# Patient Record
Sex: Male | Born: 1960 | Race: Black or African American | Hispanic: No | Marital: Single | Smoking: Never smoker
Health system: Southern US, Community
[De-identification: ages and names within clinical notes are randomized; demographics above are authoritative.]

## PROBLEM LIST (undated history)

## (undated) DIAGNOSIS — D509 Iron deficiency anemia, unspecified: Secondary | ICD-10-CM

## (undated) DIAGNOSIS — C189 Malignant neoplasm of colon, unspecified: Secondary | ICD-10-CM

## (undated) DIAGNOSIS — I82401 Acute embolism and thrombosis of unspecified deep veins of right lower extremity: Secondary | ICD-10-CM

## (undated) HISTORY — PX: LEG SURGERY: SHX1003

---

## 2004-04-27 ENCOUNTER — Emergency Department (HOSPITAL_COMMUNITY): Admission: EM | Admit: 2004-04-27 | Discharge: 2004-04-27 | Payer: Self-pay

## 2004-05-19 ENCOUNTER — Encounter: Admission: RE | Admit: 2004-05-19 | Discharge: 2004-05-19 | Payer: Self-pay | Admitting: Orthopedic Surgery

## 2004-06-18 ENCOUNTER — Encounter: Admission: RE | Admit: 2004-06-18 | Discharge: 2004-07-09 | Payer: Self-pay | Admitting: Orthopedic Surgery

## 2013-06-07 ENCOUNTER — Encounter (HOSPITAL_COMMUNITY): Payer: Self-pay | Admitting: Emergency Medicine

## 2013-06-07 ENCOUNTER — Emergency Department (HOSPITAL_COMMUNITY)
Admission: EM | Admit: 2013-06-07 | Discharge: 2013-06-07 | Disposition: A | Discharge status: Home / Self Care | Payer: Self-pay | Attending: Emergency Medicine | Admitting: Emergency Medicine

## 2013-06-07 ENCOUNTER — Emergency Department (HOSPITAL_COMMUNITY): Payer: Self-pay

## 2013-06-07 DIAGNOSIS — M7989 Other specified soft tissue disorders: Secondary | ICD-10-CM

## 2013-06-07 DIAGNOSIS — M25469 Effusion, unspecified knee: Secondary | ICD-10-CM | POA: Insufficient documentation

## 2013-06-07 DIAGNOSIS — M25461 Effusion, right knee: Secondary | ICD-10-CM

## 2013-06-07 DIAGNOSIS — F17200 Nicotine dependence, unspecified, uncomplicated: Secondary | ICD-10-CM | POA: Insufficient documentation

## 2013-06-07 DIAGNOSIS — Z87828 Personal history of other (healed) physical injury and trauma: Secondary | ICD-10-CM | POA: Insufficient documentation

## 2013-06-07 DIAGNOSIS — Z86718 Personal history of other venous thrombosis and embolism: Secondary | ICD-10-CM

## 2013-06-07 LAB — CBC WITH DIFFERENTIAL/PLATELET
Basophils Absolute: 0.1 10*3/uL (ref 0.0–0.1)
Basophils Relative: 2 % — ABNORMAL HIGH (ref 0–1)
Eosinophils Absolute: 0.1 10*3/uL (ref 0.0–0.7)
Eosinophils Relative: 2 % (ref 0–5)
HCT: 40.1 % (ref 39.0–52.0)
Hemoglobin: 13.5 g/dL (ref 13.0–17.0)
Lymphs Abs: 1.9 10*3/uL (ref 0.7–4.0)
MCHC: 33.7 g/dL (ref 30.0–36.0)
MCV: 87.2 fL (ref 78.0–100.0)
Monocytes Absolute: 0.4 10*3/uL (ref 0.1–1.0)
Monocytes Relative: 10 % (ref 3–12)
WBC: 4.1 10*3/uL (ref 4.0–10.5)

## 2013-06-07 LAB — BASIC METABOLIC PANEL
Calcium: 9.4 mg/dL (ref 8.4–10.5)
Chloride: 105 mEq/L (ref 96–112)
Glucose, Bld: 82 mg/dL (ref 70–99)
Potassium: 4.2 mEq/L (ref 3.5–5.1)
Sodium: 140 mEq/L (ref 135–145)

## 2013-06-07 LAB — SYNOVIAL CELL COUNT + DIFF, W/ CRYSTALS
Eosinophils-Synovial: 0 % (ref 0–1)
Monocyte-Macrophage-Synovial Fluid: 21 % — ABNORMAL LOW (ref 50–90)
WBC, Synovial: 37 /mm3 (ref 0–200)

## 2013-06-07 LAB — C-REACTIVE PROTEIN: CRP: <0.5 mg/dL — ABNORMAL LOW (ref <0.60)

## 2013-06-07 LAB — SEDIMENTATION RATE: Sed Rate: 25 mm/hr — ABNORMAL HIGH (ref 0–16)

## 2013-06-07 LAB — PROTIME-INR
INR: 1.02 (ref 0.00–1.49)
Prothrombin Time: 13.3 seconds (ref 11.6–15.2)

## 2013-06-07 MED ORDER — HYDROCODONE-ACETAMINOPHEN 5-325 MG PO TABS: 2.0000 | ORAL_TABLET | ORAL | Status: DC | PRN

## 2013-06-07 NOTE — ED Provider Notes (Signed)
ARTHOCENTESIS Date/Time: 06/07/2013 11:09 AM Performed by: Dorthula Matas Authorized by: Dorthula Matas Consent: Verbal consent obtained. Risks and benefits: risks, benefits and alternatives were discussed Consent given by: patient Patient understanding: patient states understanding of the procedure being performed Site marked: the operative site was marked Patient identity confirmed: verbally with patient and arm band Indications: joint swelling and pain  Body area: knee Local anesthesia used: yes Anesthesia: local infiltration Local anesthetic: lidocaine 2% without epinephrine Anesthetic total: 5 ml Needle gauge: 22 G Approach: medial Aspirate: blood-tinged and serous Aspirate amount: 25 ml    For HPI, ROS, PE, and Dispo see Dr. Bing Plume note.  Dorthula Matas, PA-C 06/07/13 1111  Dorthula Matas, PA-C 06/07/13 1125  Gilda Crease, MD 06/07/13 [phone]

## 2013-06-07 NOTE — ED Notes (Signed)
Pt here with right leg pain and swelling x 4 weeks

## 2013-06-07 NOTE — ED Notes (Signed)
Patient transported to X-ray 

## 2013-06-07 NOTE — Progress Notes (Signed)
VASCULAR LAB PRELIMINARY  PRELIMINARY  PRELIMINARY  PRELIMINARY  Right lower extremity venous duplex completed.    Preliminary report:  Right:  No evidence of DVT, superficial thrombosis, or Baker's cyst.  Lashanta Elbe, RVT 06/07/2013, 11:52 AM

## 2013-06-07 NOTE — ED Notes (Signed)
Ultrasound at Bedside 
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
## 2013-06-10 LAB — BODY FLUID CULTURE

## 2014-11-29 ENCOUNTER — Encounter (HOSPITAL_COMMUNITY): Payer: Self-pay | Admitting: *Deleted

## 2014-11-29 ENCOUNTER — Other Ambulatory Visit: Payer: Self-pay

## 2014-11-29 ENCOUNTER — Emergency Department (HOSPITAL_COMMUNITY)
Admission: EM | Admit: 2014-11-29 | Discharge: 2014-11-30 | Disposition: A | Discharge status: Home / Self Care | Payer: Self-pay | Attending: Emergency Medicine | Admitting: Emergency Medicine

## 2014-11-29 DIAGNOSIS — D649 Anemia, unspecified: Secondary | ICD-10-CM | POA: Insufficient documentation

## 2014-11-29 DIAGNOSIS — R109 Unspecified abdominal pain: Secondary | ICD-10-CM | POA: Insufficient documentation

## 2014-11-29 DIAGNOSIS — R5383 Other fatigue: Secondary | ICD-10-CM | POA: Insufficient documentation

## 2014-11-29 DIAGNOSIS — K921 Melena: Secondary | ICD-10-CM

## 2014-11-29 DIAGNOSIS — F1010 Alcohol abuse, uncomplicated: Secondary | ICD-10-CM | POA: Insufficient documentation

## 2014-11-29 DIAGNOSIS — R0602 Shortness of breath: Secondary | ICD-10-CM | POA: Insufficient documentation

## 2014-11-29 DIAGNOSIS — R55 Syncope and collapse: Secondary | ICD-10-CM | POA: Insufficient documentation

## 2014-11-29 DIAGNOSIS — Z79899 Other long term (current) drug therapy: Secondary | ICD-10-CM | POA: Insufficient documentation

## 2014-11-29 DIAGNOSIS — R195 Other fecal abnormalities: Secondary | ICD-10-CM | POA: Insufficient documentation

## 2014-11-29 DIAGNOSIS — Z720 Tobacco use: Secondary | ICD-10-CM | POA: Insufficient documentation

## 2014-11-29 LAB — CBC
HCT: 33.6 % — ABNORMAL LOW (ref 39.0–52.0)
Hemoglobin: 10.5 g/dL — ABNORMAL LOW (ref 13.0–17.0)
MCH: 25.9 pg — AB (ref 26.0–34.0)
MCHC: 31.3 g/dL (ref 30.0–36.0)
MCV: 82.8 fL (ref 78.0–100.0)
Platelets: 234 10*3/uL (ref 150–400)
RBC: 4.06 MIL/uL — AB (ref 4.22–5.81)
RDW: 13.8 % (ref 11.5–15.5)
WBC: 5.8 10*3/uL (ref 4.0–10.5)
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
## 2014-11-29 NOTE — ED Notes (Signed)
Pt in stating he has been feeling dizzy when standing up for the last two weeks, symptoms are intermittent, he has had full syncopal episode once, states tonight symptoms returned so he decided to come in, denies pain

## 2014-11-30 ENCOUNTER — Emergency Department (HOSPITAL_COMMUNITY): Payer: Self-pay

## 2014-11-30 ENCOUNTER — Encounter (HOSPITAL_COMMUNITY): Payer: Self-pay

## 2014-11-30 LAB — HEPATIC FUNCTION PANEL
ALT: 30 U/L (ref 0–53)
AST: 53 U/L — ABNORMAL HIGH (ref 0–37)
Albumin: 3.6 g/dL (ref 3.5–5.2)
Alkaline Phosphatase: 78 U/L (ref 39–117)
TOTAL PROTEIN: 8.4 g/dL — AB (ref 6.0–8.3)
Total Bilirubin: <0.2 mg/dL — ABNORMAL LOW (ref 0.3–1.2)

## 2014-11-30 LAB — BASIC METABOLIC PANEL
ANION GAP: 14 (ref 5–15)
BUN: 7 mg/dL (ref 6–23)
CALCIUM: 8.9 mg/dL (ref 8.4–10.5)
CO2: 23 mEq/L (ref 19–32)
Chloride: 103 mEq/L (ref 96–112)
Creatinine, Ser: 0.86 mg/dL (ref 0.50–1.35)
GFR calc Af Amer: >90 mL/min (ref >90)
GFR calc non Af Amer: >90 mL/min (ref >90)
Glucose, Bld: 101 mg/dL — ABNORMAL HIGH (ref 70–99)
POTASSIUM: 4.3 meq/L (ref 3.7–5.3)
SODIUM: 140 meq/L (ref 137–147)

## 2014-11-30 LAB — SAMPLE TO BLOOD BANK

## 2014-11-30 LAB — LIPASE, BLOOD: Lipase: 25 U/L (ref 11–59)

## 2014-11-30 LAB — PROTIME-INR
INR: 1.03 (ref 0.00–1.49)
Prothrombin Time: 13.6 seconds (ref 11.6–15.2)

## 2014-11-30 LAB — POC OCCULT BLOOD, ED: FECAL OCCULT BLD: NEGATIVE

## 2014-11-30 MED ORDER — FERROUS SULFATE 325 (65 FE) MG PO TABS: 325.0000 mg | ORAL_TABLET | Freq: Every day | ORAL | Status: DC

## 2014-11-30 MED ORDER — VITAMIN B-1 100 MG PO TABS: 100.0000 mg | ORAL_TABLET | Freq: Every day | ORAL | Status: DC

## 2014-11-30 MED ORDER — FOLIC ACID 1 MG PO TABS: 1.0000 mg | ORAL_TABLET | Freq: Every day | ORAL | Status: DC

## 2014-11-30 NOTE — ED Notes (Signed)
I stat troponin results not crossing over in Epic. I stat troponin result:  0.00 ng/mL

## 2014-11-30 NOTE — Discharge Instructions (Signed)
Take medications as prescribed.  Use the list below to establish primary care.  Return to the emergency department for worsening condition or new concerning symptoms.   Anemia, Nonspecific Anemia is a condition in which the concentration of red blood cells or hemoglobin in the blood is below normal. Hemoglobin is a substance in red blood cells that carries oxygen to the tissues of the body. Anemia results in not enough oxygen reaching these tissues.  CAUSES  Common causes of anemia include:   Excessive bleeding. Bleeding may be internal or external. This includes excessive bleeding from periods (in women) or from the intestine.   Poor nutrition.   Chronic kidney, thyroid, and liver disease.  Bone marrow disorders that decrease red blood cell production.  Cancer and treatments for cancer.  HIV, AIDS, and their treatments.  Spleen problems that increase red blood cell destruction.  Blood disorders.  Excess destruction of red blood cells due to infection, medicines, and autoimmune disorders. SIGNS AND SYMPTOMS   Minor weakness.   Dizziness.   Headache.  Palpitations.   Shortness of breath, especially with exercise.   Paleness.  Cold sensitivity.  Indigestion.  Nausea.  Difficulty sleeping.  Difficulty concentrating. Symptoms may occur suddenly or they may develop slowly.  DIAGNOSIS  Additional blood tests are often needed. These help your health care provider determine the best treatment. Your health care provider will check your stool for blood and look for other causes of blood loss.  TREATMENT  Treatment varies depending on the cause of the anemia. Treatment can include:   Supplements of iron, vitamin G62, or folic acid.   Hormone medicines.   A blood transfusion. This may be needed if blood loss is severe.   Hospitalization. This may be needed if there is significant continual blood loss.   Dietary changes.  Spleen removal. HOME CARE  INSTRUCTIONS Keep all follow-up appointments. It often takes many weeks to correct anemia, and having your health care provider check on your condition and your response to treatment is very important. SEEK IMMEDIATE MEDICAL CARE IF:   You develop extreme weakness, shortness of breath, or chest pain.   You become dizzy or have trouble concentrating.  You develop heavy vaginal bleeding.   You develop a rash.   You have bloody or black, tarry stools.   You faint.   You vomit up blood.   You vomit repeatedly.   You have abdominal pain.  You have a fever or persistent symptoms for more than 2-3 days.   You have a fever and your symptoms suddenly get worse.   You are dehydrated.  MAKE SURE YOU:  Understand these instructions.  Will watch your condition.  Will get help right away if you are not doing well or get worse. Document Released: 01/21/2005 Document Revised: 08/16/2013 Document Reviewed: 06/09/2013 [facility] Patient Information 2015 [place], [place]. This information is not intended to replace advice given to you by your health care provider. Make sure you discuss any questions you have with your health care provider.  Bloody Stools Bloody stools often mean that there is a problem in the digestive tract. Your caregiver may use the term "melena" to describe black, tarry, and bad smelling stools or "hematochezia" to describe red or maroon-colored stools. Blood seen in the stool can be caused by bleeding anywhere along the intestinal tract.  A black stool usually means that blood is coming from the upper part of the gastrointestinal tract (esophagus, stomach, or small bowel). Passing maroon-colored stools or  bright red blood usually means that blood is coming from lower down in the large bowel or the rectum. However, sometimes massive bleeding in the stomach or small intestine can cause bright red bloody stools.  Consuming black licorice, lead, iron pills, medicines  containing bismuth subsalicylate, or blueberries can also cause black stools. Your caregiver can test black stools to see if blood is present. It is important that the cause of the bleeding be found. Treatment can then be started, and the problem can be corrected. Rectal bleeding may not be serious, but you should not assume everything is okay until you know the cause.It is very important to follow up with your caregiver or a specialist in gastrointestinal problems. CAUSES  Blood in the stools can come from various underlying causes.Often, the cause is not found during your first visit. Testing is often needed to discover the cause of bleeding in the gastrointestinal tract. Causes range from simple to serious or even life-threatening.Possible causes include:  Hemorrhoids.These are veins that are full of blood (engorged) in the rectum. They cause pain, inflammation, and may bleed.  Anal fissures.These are areas of painful tearing which may bleed. They are often caused by passing hard stool.  Diverticulosis.These are pouches that form on the colon over time, with age, and may bleed significantly.  Diverticulitis.This is inflammation in areas with diverticulosis. It can cause pain, fever, and bloody stools, although bleeding is rare.  Proctitis and colitis. These are inflamed areas of the rectum or colon. They may cause pain, fever, and bloody stools.  Polyps and cancer. Colon cancer is a leading cause of preventable cancer death.It often starts out as precancerous polyps that can be removed during a colonoscopy, preventing progression into cancer. Sometimes, polyps and cancer may cause rectal bleeding.  Gastritis and ulcers.Bleeding from the upper gastrointestinal tract (near the stomach) may travel through the intestines and produce black, sometimes tarry, often bad smelling stools. In certain cases, if the bleeding is fast enough, the stools may not be black, but red and the condition may  be life-threatening. SYMPTOMS  You may have stools that are bright red and bloody, that are normal color with blood on them, or that are dark black and tarry. In some cases, you may only have blood in the toilet bowl. Any of these cases need medical care. You may also have:  Pain at the anus or anywhere in the rectum.  Lightheadedness or feeling faint.  Extreme weakness.  Nausea or vomiting.  Fever. DIAGNOSIS Your caregiver may use the following methods to find the cause of your bleeding:  Taking a medical history. Age is important. Older people tend to develop polyps and cancer more often. If there is anal pain and a hard, large stool associated with bleeding, a tear of the anus may be the cause. If blood drips into the toilet after a bowel movement, bleeding hemorrhoids may be the problem. The color and frequency of the bleeding are additional considerations. In most cases, the medical history provides clues, but seldom the final answer.  A visual and finger (digital) exam. Your caregiver will inspect the anal area, looking for tears and hemorrhoids. A finger exam can provide information when there is tenderness or a growth inside. In men, the prostate is also examined.  Endoscopy. Several types of small, long scopes (endoscopes) are used to view the colon.  In the office, your caregiver may use a rigid, or more commonly, a flexible viewing sigmoidoscope. This exam is called flexible sigmoidoscopy.  It is performed in 5 to 10 minutes.  A more thorough exam is accomplished with a colonoscope. It allows your caregiver to view the entire 5 to 6 foot long colon. Medicine to help you relax (sedative) is usually given for this exam. Frequently, a bleeding lesion may be present beyond the reach of the sigmoidoscope. So, a colonoscopy may be the best exam to start with. Both exams are usually done on an outpatient basis. This means the patient does not stay overnight in the hospital or surgery  center.  An upper endoscopy may be needed to examine your stomach. Sedation is used and a flexible endoscope is put in your mouth, down to your stomach.  A barium enema X-ray. This is an X-ray exam. It uses liquid barium inserted by enema into the rectum. This test alone may not identify an actual bleeding point. X-rays highlight abnormal shadows, such as those made by lumps (tumors), diverticuli, or colitis. TREATMENT  Treatment depends on the cause of your bleeding.   For bleeding from the stomach or colon, the caregiver doing your endoscopy or colonoscopy may be able to stop the bleeding as part of the procedure.  Inflammation or infection of the colon can be treated with medicines.  Many rectal problems can be treated with creams, suppositories, or warm baths.  Surgery is sometimes needed.  Blood transfusions are sometimes needed if you have lost a lot of blood.  For any bleeding problem, let your caregiver know if you take aspirin or other blood thinners regularly. HOME CARE INSTRUCTIONS   Take any medicines exactly as prescribed.  Keep your stools soft by eating a diet high in fiber. Prunes (1 to 3 a day) work well for many people.  Drink enough water and fluids to keep your urine clear or pale yellow.  Take sitz baths if advised. A sitz bath is when you sit in a bathtub with warm water for 10 to 15 minutes to soak, soothe, and cleanse the rectal area.  If enemas or suppositories are advised, be sure you know how to use them. Tell your caregiver if you have problems with this.  Monitor your bowel movements to look for signs of improvement or worsening. SEEK MEDICAL CARE IF:   You do not improve in the time expected.  Your condition worsens after initial improvement.  You develop any new symptoms. SEEK IMMEDIATE MEDICAL CARE IF:   You develop severe or prolonged rectal bleeding.  You vomit blood.  You feel weak or faint.  You have a fever. MAKE SURE  YOU:  Understand these instructions.  Will watch your condition.  Will get help right away if you are not doing well or get worse. Document Released: 12/04/2002 Document Revised: 03/07/2012 Document Reviewed: 05/01/2011 [facility] Patient Information 2015 [place], [place]. This information is not intended to replace advice given to you by your health care provider. Make sure you discuss any questions you have with your health care provider.  Syncope Syncope means a person passes out (faints). The person usually wakes up in less than 5 minutes. It is important to seek medical care for syncope. HOME CARE  Have someone stay with you until you feel normal.  Do not drive, use machines, or play sports until your doctor says it is okay.  Keep all doctor visits as told.  Lie down when you feel like you might pass out. Take deep breaths. Wait until you feel normal before standing up.  Drink enough fluids to keep your  pee (urine) clear or pale yellow.  If you take blood pressure or heart medicine, get up slowly. Take several minutes to sit and then stand. GET HELP RIGHT AWAY IF:   You have a severe headache.  You have pain in the chest, belly (abdomen), or back.  You are bleeding from the mouth or butt (rectum).  You have black or tarry poop (stool).  You have an irregular or very fast heartbeat.  You have pain with breathing.  You keep passing out, or you have shaking (seizures) when you pass out.  You pass out when sitting or lying down.  You feel confused.  You have trouble walking.  You have severe weakness.  You have vision problems. If you fainted, call your local emergency services (911 in [place] Do not drive yourself to the hospital. MAKE SURE YOU:   Understand these instructions.  Will watch your condition.  Will get help right away if you are not doing well or get worse. Document Released: 06/01/2008 Document Revised: 06/14/2012 Document Reviewed:  02/12/2012 [facility] Patient Information 2015 [place], [place]. This information is not intended to replace advice given to you by your health care provider. Make sure you discuss any questions you have with your health care provider.   Alcohol and Nutrition Nutrition serves two purposes. It provides energy. It also maintains body structure and function. Food supplies energy. It also provides the building blocks needed to replace worn or damaged cells. Alcoholics often eat poorly. This limits their supply of essential nutrients. This affects energy supply and structure maintenance. Alcohol also affects the body's nutrients in:  Digestion.  Storage.  Using and getting rid of waste products. IMPAIRMENT OF NUTRIENT DIGESTION AND UTILIZATION   Once ingested, food must be broken down into small components (digested). Then it is available for energy. It helps maintain body structure and function. Digestion begins in the mouth. It continues in the stomach and intestines, with help from the pancreas. The nutrients from digested food are absorbed from the intestines into the blood. Then they are carried to the liver. The liver prepares nutrients for:  Immediate use.  Storage and future use.  Alcohol inhibits the breakdown of nutrients into usable molecules.  It decreases secretion of digestive enzymes from the pancreas.  Alcohol impairs nutrient absorption by damaging the cells lining the stomach and intestines.  It also interferes with moving some nutrients into the blood.  In addition, nutritional deficiencies themselves may lead to further absorption problems.  For example, folate deficiency changes the cells that line the small intestine. This impairs how water is absorbed. It also affects absorbed nutrients. These include glucose, sodium, and additional folate.  Even if nutrients are digested and absorbed, alcohol can prevent them from being fully used. It changes their transport, storage,  and excretion. Impaired utilization of nutrients by alcoholics is indicated by:  Decreased liver stores of vitamins, such as vitamin A.  Increased excretion of nutrients such as fat. ALCOHOL AND ENERGY SUPPLY   Three basic nutritional components found in food are:  Carbohydrates.  Proteins.  Fats.  These are used as energy. Some alcoholics take in as much as 50% of their total daily calories from alcohol. They often neglect important foods.  Even when enough food is eaten, alcohol can impair the ways the body controls blood sugar (glucose) levels. It may either increase or decrease blood sugar.  In non-diabetic alcoholics, increased blood sugar (hyperglycemia) is caused by poor insulin secretion. It is usually temporary.  Decreased blood sugar (hypoglycemia) can cause serious injury even if this condition is short-lived. Low blood sugar can happen when a fasting or malnourished person drinks alcohol. When there is no food to supply energy, stored sugar is used up. The products of alcohol inhibit forming glucose from other compounds such as amino acids. As a result, alcohol causes the brain and other body tissue to lack glucose. It is needed for energy and function.  Alcohol is an energy source. But how the body processes and uses the energy from alcohol is complex. Also, when alcohol is substituted for carbohydrates, subjects tend to lose weight. This indicates that they get less energy from alcohol than from food. ALCOHOL - MAINTAINING CELL STRUCTURE AND FUNCTION  Structure Cells are made mostly of protein. So an adequate protein diet is important for maintaining cell structure. This is especially true if cells are being damaged. Research indicates that alcohol affects protein nutrition by causing impaired:  Digestion of proteins to amino acids.  Processing of amino acids by the small intestine and liver.  Synthesis of proteins from amino acids.  Protein secretion by the  liver. Function Nutrients are essential for the body to function well. They provide the tools that the body needs to work well:   Proteins.  Vitamins.  Minerals. Alcohol can disrupt body function. It may cause nutrient deficiencies. And it may interfere with the way nutrients are processed. Vitamins  Vitamins are essential to maintain growth and normal metabolism. They regulate many of the body`s processes. Chronic heavy drinking causes deficiencies in many vitamins. This is caused by eating less. And, in some cases, vitamins may be poorly absorbed. For example, alcohol inhibits fat absorption. It impairs how the vitamins A, E, and D are normally absorbed along with dietary fats. Not enough vitamin A may cause night blindness. Not enough vitamin D may cause softening of the bones.  Some alcoholics lack vitamins A, C, D, E, K, and the B vitamins. These are all involved in wound healing and cell maintenance. In particular, because vitamin K is necessary for blood clotting, lacking that vitamin can cause delayed clotting. The result is excess bleeding. Lacking other vitamins involved in brain function may cause severe neurological damage. Minerals Deficiencies of minerals such as calcium, magnesium, iron, and zinc are common in alcoholics. The alcohol itself does not seem to affect how these minerals are absorbed. Rather, they seem to occur secondary to other alcohol-related problems, such as:  Less calcium absorbed.  Not enough magnesium.  More urinary excretion.  Vomiting.  Diarrhea.  Not enough iron due to gastrointestinal bleeding.  Not enough zinc or losses related to other nutrient deficiencies.  Mineral deficiencies can cause a variety of medical consequences. These range from calcium-related bone disease to zinc-related night blindness and skin lesions. ALCOHOL, MALNUTRITION, AND MEDICAL COMPLICATIONS  Liver Disease   Alcoholic liver damage is caused primarily by alcohol  itself. But poor nutrition may increase the risk of alcohol-related liver damage. For example, nutrients normally found in the liver are known to be affected by drinking alcohol. These include carotenoids, which are the major sources of vitamin A, and vitamin E compounds. Decreases in such nutrients may play some role in alcohol-related liver damage. Pancreatitis  Research suggests that malnutrition may increase the risk of developing alcoholic pancreatitis. Research suggests that a diet lacking in protein may increase alcohol's damaging effect on the pancreas. Brain  Nutritional deficiencies may have severe effects on brain function. These may be permanent. Specifically,  thiamine deficiencies are often seen in alcoholics. They can cause severe neurological problems. These include:  Impaired movement.  Memory loss seen in Wernicke-Korsakoff syndrome. Pregnancy  Alcohol has toxic effects on fetal development. It causes alcohol-related birth defects. They include fetal alcohol syndrome. Alcohol itself is toxic to the fetus. Also, the nutritional deficiency can affect how the fetus develops. That may compound the risk of developmental damage.  Nutritional needs during pregnancy are 10% to 30% greater than normal. Food intake can increase by as much as 140% to cover the needs of both mother and fetus. An alcoholic mother`s nutritional problems may adversely affect the nutrition of the fetus. And alcohol itself can also restrict nutrition flow to the fetus. NUTRITIONAL STATUS OF ALCOHOLICS  Techniques for assessing nutritional status include:  Taking body measurements to estimate fat reserves. They include:  Weight.  Height.  Mass.  Skin fold thickness.  Performing blood analysis to provide measurements of circulating:  Proteins.  Vitamins.  Minerals.  These techniques tend to be imprecise. For many nutrients, there is no clear "cut-off" point that would allow an accurate definition  of deficiency. So assessing the nutritional status of alcoholics is limited by these techniques. Dietary status may provide information about the risk of developing nutritional problems. Dietary status is assessed by:  Taking patients' dietary histories.  Evaluating the amount and types of food they are eating.  It is difficult to determine what exact amount of alcohol begins to have damaging effects on nutrition. In general, moderate drinkers have 2 drinks or less per day. They seem to be at little risk for nutritional problems. Various medical disorders begin to appear at greater levels.  Research indicates that the majority of even the heaviest drinkers have few obvious nutritional deficiencies. Many alcoholics who are hospitalized for medical complications of their disease do have severe malnutrition. Alcoholics tend to eat poorly. Often they eat less than the amounts of food necessary to provide enough:  Carbohydrates.  Protein.  Fat.  Vitamins A and C.  B vitamins.  Minerals like calcium and iron. Of major concern is alcohol's effect on digesting food and use of nutrients. It may shift a mildly malnourished person toward severe malnutrition. Document Released: 10/08/2005 Document Revised: 03/07/2012 Document Reviewed: 03/24/2006 [facility] Patient Information 2015 [place], [place]. This information is not intended to replace advice given to you by your health care provider. Make sure you discuss any questions you have with your health care provider.    Emergency Department Resource Guide 1) Find a Doctor and Pay Out of Pocket Although you won't have to find out who is covered by your insurance plan, it is a good idea to ask around and get recommendations. You will then need to call the office and see if the doctor you have chosen will accept you as a new patient and what types of options they offer for patients who are self-pay. Some doctors offer discounts or will set up payment  plans for their patients who do not have insurance, but you will need to ask so you aren't surprised when you get to your appointment.  2) Contact Your Local Health Department Not all health departments have doctors that can see patients for sick visits, but many do, so it is worth a call to see if yours does. If you don't know where your local health department is, you can check in your phone book. The CDC also has a tool to help you locate your state's health department, and  many state websites also have listings of all of their local health departments.  3) Find a [facility] If your illness is not likely to be very severe or complicated, you may want to try a walk in clinic. These are popping up all over the country in pharmacies, drugstores, and shopping centers. They're usually staffed by nurse practitioners or physician assistants that have been trained to treat common illnesses and complaints. They're usually fairly quick and inexpensive. However, if you have serious medical issues or chronic medical problems, these are probably not your best option.  No Primary Care Doctor: - Call [facility] at  [phone] - they can help you locate a primary care doctor that  accepts your insurance, provides certain services, etc. - Physician Referral Service- [phone]  Chronic Pain Problems: Organization         Address  Phone   Notes  [facility]  [phone] Patients need to be referred by their primary care doctor.   Medication Assistance: Organization         Address  Phone   Notes  [facility] [address] [place], [place] [address] [phone] --Must be a resident of [facility] -- Must have NO insurance coverage whatsoever (no Medicaid/ Medicare, etc.) -- The pt. MUST have a primary care doctor that directs their care regularly and follows them in the community   [facility]  [phone]   [employer]   [phone]    Agencies that provide inexpensive medical care: Organization         Address  Phone   Notes  [place]  [phone]   [facility]    [phone]   [facility] [place], [place] [address] [phone]   [place] [address], [place] [phone]   [facility]    [phone]   [facility]    [phone]   [place] [address], [place] Phone:  [phone], Fax:  [phone] Hours of Operation:  9 am - 6 pm, M-F.  Also accepts Medicaid/Medicare and self-pay.  [facility] [place] [address], [address], [place] Phone: [phone], Fax: [phone]. Hours of Operation:  8:30 am - 5:30 pm, M-F.  Also accepts Medicaid and self-pay.  [facility] [address], [place] Phone: [phone]   [place], [place], [place] [phone], Ext. [phone] Mondays & Thursdays: 7-9 AM.  First 15 patients are seen on a first come, first serve basis.    [place] Providers:  Organization         Address  Phone   Notes  [facility] [address], [place] [phone] Also accepts self-pay patients.  [facility] [address], [place]  [phone]   [place], [address], [place] [phone]   [facility] [address], [place] [phone]   [name] [address], [place]   [phone] Only accepts [place] Access [place] patients after they have their name applied to their card.   Self-Pay (no insurance) in [facility]:  [employer]  Phone   Notes  Sickle Cell Patients, [facility] [place] [phone]   [facility] [place] [phone]   [facility] [place]  [address], [address], [place] [phone]   [facility]/Dr. Osei-Bonsu  [address] [place] or [address], [place] [phone] Phone number for both [place] and [place] locations is the same.  [facility] [address] [place] [phone]   [facility] [address], [place] or [address] [phone]   [facility] [address], [place] [phone], phone; [phone], fax Sees patients 1st and 3rd Saturday of every month.  Must not qualify for public or private insurance (i.e. Medicaid, Medicare, [place] Health Choice, Veterans' Benefits)  Household income should be no more than 200% of the poverty level The clinic cannot treat you if you are pregnant or think you are pregnant  Sexually transmitted diseases are not treated at the clinic.    Dental Care: Organization         Address  Phone  Notes  [facility] Department of [facility] [phone] Accepts children up to age 85 who are enrolled in [place] or [place]; pregnant women with a Medicaid card; and children who have applied for Medicaid or [place] Health Choice, but were declined, whose parents can pay a reduced fee at time of service.  [facility] Department of [facility]  [address], [place] [phone] Accepts children up to age 39 who are enrolled in [place] or [place]; pregnant women with a Medicaid card; and children who have applied for Medicaid or  Health Choice, but were declined, whose parents can pay a reduced fee at time of service.  [place] Adult [facility] Access PROGRAM  [place] [phone] Patients are  seen by appointment only. Walk-ins are not accepted. [facility] will see patients 80 years of age and older. Monday - Tuesday (8am-5pm) Most Wednesdays (8:30-5pm) $30 per visit, cash only  [facility] Adult Dental Access PROGRAM  [address], [facility] [phone] Patients are seen by appointment only. Walk-ins are not accepted. [place] will see patients 53 years of age and older. One Wednesday Evening (Monthly: Volunteer Based).  $30 per visit, cash only  [place]  [phone] for adults; Children under age 55, call Graduate Pediatric Dentistry at [phone]. Children aged 37-14, please call [phone] to request a pediatric application.  Dental services are provided in all areas of dental care including fillings, crowns and bridges, complete and partial dentures, implants, gum treatment, root canals, and extractions. Preventive care is also provided. Treatment is provided to both adults and children. Patients are selected via a lottery and there is often a waiting list.   [facility] [address] [place]  [phone] [email or web address]   [facility] [address] [place], [place] [phone], Ext. [phone] Second and Fourth Thursday of each month, opens at 6:30 AM; Clinic ends at 9 AM.  Patients are seen on a first-come first-served basis, and a limited number are seen during each clinic.   [facility]  [address], [place], [place] (  [phone]   Eligibility Requirements You must have lived in [place], [place], or [place] counties for at least the last three months.   You cannot be eligible for state or federal sponsored [employer], including [employer], [place], or [employer].   You generally cannot be eligible for healthcare insurance through your employer.    How to apply: Eligibility screenings are held every Tuesday and Wednesday afternoon from 1:00 pm until 4:00  pm. You do not need an appointment for the interview!  [facility] [address], [place], [place]   [place]  [place] Department  [place]  [phone]    Behavioral Health Resources in the Community: Intensive Outpatient Programs Organization         Address  Phone  Notes  [place]. [address] [place], [place] [phone]   [facility] Outpatient [address], [place], [place]   ADS: Alcohol & Drug Sv[facility] [address] [place], [place]   [place] [address],  [place], [place] or [phone]   Substance Abuse Resources Organization         Address  Phone  Notes  Alcohol and Drug Services  [phone]   [place]  [phone]   [facility] [place]   Chinita Pester  [phone]   Residential & Outpatient Substance Abuse Program  [phone]   Psychological Services Organization         Address  Phone  Notes  [facility] [place]  [place]  [phone]   [place] [address] [place] or [phone]    Mobile Crisis Teams Organization         Address  Phone  Notes  [facility], Mobile Crisis Care Unit  [phone]   [facility]  [address] [place], [place]   [place] [address], [place] [phone]    Self-Help/Support Groups Organization         Address  Phone             Notes  [place] [place] - variety of support groups  [employer] Call for more information  Narcotics Anonymous (NA), [facility] [address], [employer] [place]  2 meetings at this location   Special educational needs teacher          Address  Phone  Notes  [facility] [place],    [place]  [phone]   [facility]  [address] [place] [phone], [place], [place]   [place], [place] [phone] Admissions: 8am-3pm M-F  Incentives Substance [place] [address]    [place], [place] [phone]   [facility] [address] Jadene Pierini [place], [place]   [facility] [address]  [place], [place]   [facility] - Intensive Outpatient [address] Kristeen [address], [place], [place]   [facility] ([place].,  [place], [place] or [phone]   [facility] ([facility]) [address] [place], [place] Accepts Medicaid  [facility] [place] [address],  [place] [phone] Substance Abuse/Addiction Treatment   [facility] Resources Organization  Address  Phone  Notes  [facility]  [phone]   Domenic Schwab, PhD [address] Arlis Porta [place], [place]   [phone] or [phone]   [place], [place] [phone]   [address], [place], [place] [phone] Insurance/Medicaid/sponsorship through [facility] and Families [address]                                    [place], [place] [phone] [place] [address], [place] [phone]    Dr. Adele Schilder  [phone]   [facility] [place] Dept. 1) [address] [place] 2) [place] 3)  [place] [address], [place] [phone]  [phone]   [place] [phone] or [phone] (After Hours)

## 2014-12-03 LAB — I-STAT TROPONIN, ED: Troponin i, poc: 0 ng/mL (ref 0.00–0.08)

## 2016-07-24 ENCOUNTER — Encounter (HOSPITAL_COMMUNITY): Payer: Self-pay | Admitting: *Deleted

## 2016-07-24 ENCOUNTER — Emergency Department (HOSPITAL_COMMUNITY)
Admission: EM | Admit: 2016-07-24 | Discharge: 2016-07-24 | Disposition: A | Discharge status: Home / Self Care | Payer: Self-pay | Attending: Emergency Medicine | Admitting: Emergency Medicine

## 2016-07-24 DIAGNOSIS — R1010 Upper abdominal pain, unspecified: Secondary | ICD-10-CM | POA: Insufficient documentation

## 2016-07-24 DIAGNOSIS — F17200 Nicotine dependence, unspecified, uncomplicated: Secondary | ICD-10-CM | POA: Insufficient documentation

## 2016-07-24 LAB — COMPREHENSIVE METABOLIC PANEL
ALBUMIN: 2.9 g/dL — AB (ref 3.5–5.0)
ALT: 8 U/L — ABNORMAL LOW (ref 17–63)
AST: 21 U/L (ref 15–41)
Alkaline Phosphatase: 72 U/L (ref 38–126)
Anion gap: 6 (ref 5–15)
BILIRUBIN TOTAL: 0.7 mg/dL (ref 0.3–1.2)
CO2: 24 mmol/L (ref 22–32)
CREATININE: 0.6 mg/dL — AB (ref 0.61–1.24)
Calcium: 9.1 mg/dL (ref 8.9–10.3)
Chloride: 103 mmol/L (ref 101–111)
GFR calc Af Amer: >60 mL/min (ref >60)
GFR calc non Af Amer: >60 mL/min (ref >60)
GLUCOSE: 108 mg/dL — AB (ref 65–99)
POTASSIUM: 3.9 mmol/L (ref 3.5–5.1)
Sodium: 133 mmol/L — ABNORMAL LOW (ref 135–145)
TOTAL PROTEIN: 7.5 g/dL (ref 6.5–8.1)

## 2016-07-24 LAB — URINALYSIS, ROUTINE W REFLEX MICROSCOPIC
GLUCOSE, UA: NEGATIVE mg/dL
Hgb urine dipstick: NEGATIVE
KETONES UR: 15 mg/dL — AB
Leukocytes, UA: NEGATIVE
Nitrite: NEGATIVE
Protein, ur: NEGATIVE mg/dL
Specific Gravity, Urine: 1.02 (ref 1.005–1.030)
pH: 5 (ref 5.0–8.0)

## 2016-07-24 LAB — CBC
HCT: 35.3 % — ABNORMAL LOW (ref 39.0–52.0)
Hemoglobin: 11.4 g/dL — ABNORMAL LOW (ref 13.0–17.0)
MCH: 27 pg (ref 26.0–34.0)
MCHC: 32.3 g/dL (ref 30.0–36.0)
MCV: 83.5 fL (ref 78.0–100.0)
PLATELETS: 267 10*3/uL (ref 150–400)
RBC: 4.23 MIL/uL (ref 4.22–5.81)
RDW: 14 % (ref 11.5–15.5)
WBC: 9.1 10*3/uL (ref 4.0–10.5)

## 2016-07-24 LAB — LIPASE, BLOOD: Lipase: 16 U/L (ref 11–51)

## 2016-07-24 MED ORDER — FENTANYL CITRATE (PF) 100 MCG/2ML IJ SOLN
50.0000 ug | Freq: Once | INTRAMUSCULAR | Status: AC
  Administered 2016-07-24: 50 ug via INTRAVENOUS
  Filled 2016-07-24: qty 2

## 2016-07-24 MED ORDER — SODIUM CHLORIDE 0.9 % IV BOLUS (SEPSIS)
1000.0000 mL | Freq: Once | INTRAVENOUS | Status: AC
  Administered 2016-07-24: 1000 mL via INTRAVENOUS

## 2016-07-24 MED ORDER — FAMOTIDINE IN NACL 20-0.9 MG/50ML-% IV SOLN
20.0000 mg | Freq: Once | INTRAVENOUS | Status: AC
  Administered 2016-07-24: 20 mg via INTRAVENOUS
  Filled 2016-07-24: qty 50

## 2016-07-24 MED ORDER — ONDANSETRON HCL 4 MG/2ML IJ SOLN
4.0000 mg | Freq: Once | INTRAMUSCULAR | Status: AC
  Administered 2016-07-24: 4 mg via INTRAVENOUS
  Filled 2016-07-24: qty 2

## 2016-07-24 MED ORDER — FAMOTIDINE 20 MG PO TABS: 20.0000 mg | ORAL_TABLET | Freq: Two times a day (BID) | ORAL | 0 refills | Status: DC

## 2016-07-24 MED ORDER — SUCRALFATE 1 G PO TABS: 1.0000 g | ORAL_TABLET | Freq: Three times a day (TID) | ORAL | 0 refills | Status: DC

## 2016-07-24 NOTE — ED Notes (Signed)
Pt has urinal at bedside will attempt to obtain urine when he is able to go.

## 2016-07-24 NOTE — ED Triage Notes (Signed)
Pt reports left side back pain that radiates around to left lower abd x 2-3 weeks. Denies urinary symptoms.

## 2016-07-24 NOTE — Discharge Instructions (Signed)
As we discussed, I encourage you to cut back on your alcohol consumption. In the meantime take medications as prescribed. Follow up with GI. Return to the ER for new or worsening symptoms.

## 2016-07-24 NOTE — ED Provider Notes (Signed)
[place] [facility] DEPT Provider Note   CSN: [id number] Arrival date & time: 07/24/16  [id number]  First Provider Contact:  First MD Initiated Contact with Patient 07/24/16 1120        History   Chief Complaint Chief Complaint  Patient presents with  . Abdominal Pain  . Back Pain   HPI   Jeff [name] is an 55 y.o. male with history of alcohol abuse who presents to the ED for evaluation of abdominal pain. He reports lower left abdominal pain that radiates to his left flank for the past 2-3 weeks. He states the pain is constant though waxes and wanes in severity. He states the pain is worse with movement and with PO intake. He denies n/v/d. Denies urinary symptoms. States nothing like this has ever happened before. He has not tried anything to alleviate his pain.  History reviewed. No pertinent past medical history.  There are no active problems to display for this patient.   History reviewed. No pertinent surgical history.     Home Medications    Prior to Admission medications   Medication Sig Start Date End Date Taking? Authorizing Provider  ferrous sulfate 325 (65 FE) MG tablet Take 1 tablet (325 mg total) by mouth daily. 11/30/14  Yes Linton Flemings, MD  folic acid (FOLVITE) 1 MG tablet Take 1 tablet (1 mg total) by mouth daily. 11/30/14  Yes Linton Flemings, MD  thiamine (VITAMIN B-1) 100 MG tablet Take 1 tablet (100 mg total) by mouth daily. 11/30/14  Yes Linton Flemings, MD  HYDROcodone-acetaminophen (NORCO/VICODIN) 5-325 MG per tablet Take 2 tablets by mouth every 4 (four) hours as needed for pain. Patient not taking: Reported on 07/24/2016 06/07/13   Orpah Greek, MD    Family History History reviewed. No pertinent family history.  Social History Social History  Substance Use Topics  . Smoking status: Current Every Day Smoker  . Smokeless tobacco: Not on file  . Alcohol use Yes     Allergies   Review of patient's allergies indicates no known allergies.   Review of  Systems Review of Systems 10 Systems reviewed and are negative for acute change except as noted in the HPI.   Physical Exam Updated Vital Signs BP 128/88   Pulse 77   Temp 98.3 F (36.8 C) (Oral)   Resp 16   SpO2 100%   Physical Exam  Constitutional: He is oriented to person, place, and time.  HENT:  Right Ear: External ear normal.  Left Ear: External ear normal.  Nose: Nose normal.  Mouth/Throat: Oropharynx is clear and moist. No oropharyngeal exudate.  Eyes: Conjunctivae and EOM are normal. Pupils are equal, round, and reactive to light.  Neck: Normal range of motion. Neck supple.  Cardiovascular: Normal rate, regular rhythm, normal heart sounds and intact distal pulses.   Pulmonary/Chest: Effort normal and breath sounds normal. No respiratory distress. He has no wheezes. He exhibits no tenderness.  Abdominal: Soft. Bowel sounds are normal. He exhibits no distension. There is no rebound and no guarding.  Mild diffuse abdominal tenderness, most at epigastrum, without guarding. Abdomen soft nondistended.  Musculoskeletal: He exhibits no edema.  Neurological: He is alert and oriented to person, place, and time. No cranial nerve deficit.  Skin: Skin is warm and dry.  Psychiatric: He has a normal mood and affect.  Nursing note and vitals reviewed.    ED Treatments / Results  Labs (all labs ordered are listed, but only abnormal results are displayed) Labs Reviewed  COMPREHENSIVE METABOLIC PANEL - Abnormal; Notable for the following:       Result Value   Sodium 133 (*)    Glucose, Bld 108 (*)    BUN <5 (*)    Creatinine, Ser 0.60 (*)    Albumin 2.9 (*)    ALT 8 (*)    All other components within normal limits  CBC - Abnormal; Notable for the following:    Hemoglobin 11.4 (*)    HCT 35.3 (*)    All other components within normal limits  URINALYSIS, ROUTINE W REFLEX MICROSCOPIC (NOT AT [facility]) - Abnormal; Notable for the following:    Color, Urine AMBER (*)    Bilirubin  Urine SMALL (*)    Ketones, ur 15 (*)    All other components within normal limits  LIPASE, BLOOD    EKG  EKG Interpretation None       Radiology No results found.  Procedures Procedures (including critical care time)  Medications Ordered in ED Medications  sodium chloride 0.9 % bolus 1,000 mL (0 mLs Intravenous Stopped 07/24/16 1309)  fentaNYL (SUBLIMAZE) injection 50 mcg (50 mcg Intravenous Given 07/24/16 1200)  ondansetron (ZOFRAN) injection 4 mg (4 mg Intravenous Given 07/24/16 1159)  famotidine (PEPCID) IVPB 20 mg premix (0 mg Intravenous Stopped 07/24/16 1340)     Initial Impression / Assessment and Plan / ED Course  I have reviewed the triage vital signs and the nursing notes.  Pertinent labs & imaging results that were available during my care of the patient were reviewed by me and considered in my medical decision making (see chart for details).  Labs unrevealing. On repeat exam after fluids and meds abdominal exam completely benign. Pain has resolved. Doubt emergent intra-abdominal process requiring further work up or imaging. Pt does drink EtOH daily which I suspect might be contributing to his symptoms. Poss GERD vs PUD vs alcoholic gastritis. I discussed alcohol cessation (or at least reduction) and he feels he can do so. Declined outpatient resources for substance use. Will give rx for pepcid and carafate. Encouraged PCP and GI follow up. ER return precautions given.  Final Clinical Impressions(s) / ED Diagnoses   Final diagnoses:  Pain of upper abdomen    New Prescriptions Discharge Medication List as of 07/24/2016  1:25 PM    START taking these medications   Details  famotidine (PEPCID) 20 MG tablet Take 1 tablet (20 mg total) by mouth 2 (two) times daily., Starting Fri 07/24/2016, Print    sucralfate (CARAFATE) 1 g tablet Take 1 tablet (1 g total) by mouth 3 (three) times daily with meals., Starting Fri 07/24/2016, Print         Anne Ng,  PA-C 07/24/16 1931    Quintella Reichert, MD 07/25/16 1349

## 2016-07-24 NOTE — ED Notes (Signed)
Pt comfortable with discharge and follow up instructions. Pt declines wheelchair, escorted to waiting area by this RN. Rx x2

## 2016-07-24 NOTE — ED Notes (Signed)
EMT [place] collected urine.

## 2017-01-10 ENCOUNTER — Inpatient Hospital Stay (HOSPITAL_COMMUNITY)
Admission: EM | Admit: 2017-01-10 | Discharge: 2017-01-19 | DRG: 374 | Disposition: A | Discharge status: Home / Self Care | Payer: Medicaid Other | Attending: Internal Medicine | Admitting: Internal Medicine

## 2017-01-10 ENCOUNTER — Emergency Department (HOSPITAL_COMMUNITY): Payer: Medicaid Other

## 2017-01-10 ENCOUNTER — Encounter (HOSPITAL_COMMUNITY): Payer: Self-pay | Admitting: *Deleted

## 2017-01-10 DIAGNOSIS — R000 Tachycardia, unspecified: Secondary | ICD-10-CM | POA: Diagnosis present

## 2017-01-10 DIAGNOSIS — R509 Fever, unspecified: Secondary | ICD-10-CM | POA: Diagnosis present

## 2017-01-10 DIAGNOSIS — R627 Adult failure to thrive: Secondary | ICD-10-CM | POA: Diagnosis present

## 2017-01-10 DIAGNOSIS — Z6820 Body mass index (BMI) 20.0-20.9, adult: Secondary | ICD-10-CM

## 2017-01-10 DIAGNOSIS — C762 Malignant neoplasm of abdomen: Principal | ICD-10-CM | POA: Diagnosis present

## 2017-01-10 DIAGNOSIS — R1012 Left upper quadrant pain: Secondary | ICD-10-CM

## 2017-01-10 DIAGNOSIS — E876 Hypokalemia: Secondary | ICD-10-CM | POA: Diagnosis present

## 2017-01-10 DIAGNOSIS — E872 Acidosis: Secondary | ICD-10-CM | POA: Diagnosis present

## 2017-01-10 DIAGNOSIS — R1902 Left upper quadrant abdominal swelling, mass and lump: Secondary | ICD-10-CM

## 2017-01-10 DIAGNOSIS — D127 Benign neoplasm of rectosigmoid junction: Secondary | ICD-10-CM | POA: Diagnosis present

## 2017-01-10 DIAGNOSIS — D509 Iron deficiency anemia, unspecified: Secondary | ICD-10-CM | POA: Diagnosis present

## 2017-01-10 DIAGNOSIS — F17200 Nicotine dependence, unspecified, uncomplicated: Secondary | ICD-10-CM | POA: Diagnosis present

## 2017-01-10 DIAGNOSIS — R160 Hepatomegaly, not elsewhere classified: Secondary | ICD-10-CM

## 2017-01-10 DIAGNOSIS — R1900 Intra-abdominal and pelvic swelling, mass and lump, unspecified site: Secondary | ICD-10-CM | POA: Diagnosis present

## 2017-01-10 DIAGNOSIS — N179 Acute kidney failure, unspecified: Secondary | ICD-10-CM | POA: Diagnosis present

## 2017-01-10 DIAGNOSIS — K6389 Other specified diseases of intestine: Secondary | ICD-10-CM

## 2017-01-10 DIAGNOSIS — D72829 Elevated white blood cell count, unspecified: Secondary | ICD-10-CM | POA: Diagnosis present

## 2017-01-10 DIAGNOSIS — E43 Unspecified severe protein-calorie malnutrition: Secondary | ICD-10-CM | POA: Diagnosis present

## 2017-01-10 HISTORY — DX: Iron deficiency anemia, unspecified: D50.9

## 2017-01-10 LAB — COMPREHENSIVE METABOLIC PANEL
ALT: 7 U/L — ABNORMAL LOW (ref 17–63)
ANION GAP: 8 (ref 5–15)
AST: 21 U/L (ref 15–41)
Albumin: 2.8 g/dL — ABNORMAL LOW (ref 3.5–5.0)
Alkaline Phosphatase: 70 U/L (ref 38–126)
BUN: 10 mg/dL (ref 6–20)
CHLORIDE: 102 mmol/L (ref 101–111)
CO2: 23 mmol/L (ref 22–32)
Calcium: 9 mg/dL (ref 8.9–10.3)
Creatinine, Ser: 0.95 mg/dL (ref 0.61–1.24)
GFR calc Af Amer: >60 mL/min (ref >60)
Glucose, Bld: 162 mg/dL — ABNORMAL HIGH (ref 65–99)
Potassium: 3.9 mmol/L (ref 3.5–5.1)
Sodium: 133 mmol/L — ABNORMAL LOW (ref 135–145)
TOTAL PROTEIN: 8.3 g/dL — AB (ref 6.5–8.1)
Total Bilirubin: 0.8 mg/dL (ref 0.3–1.2)

## 2017-01-10 LAB — CBC
HCT: 31.3 % — ABNORMAL LOW (ref 39.0–52.0)
Hemoglobin: 9.7 g/dL — ABNORMAL LOW (ref 13.0–17.0)
MCH: 22 pg — ABNORMAL LOW (ref 26.0–34.0)
MCHC: 31 g/dL (ref 30.0–36.0)
MCV: 71.1 fL — ABNORMAL LOW (ref 78.0–100.0)
Platelets: 219 10*3/uL (ref 150–400)
RBC: 4.4 MIL/uL (ref 4.22–5.81)
RDW: 17.1 % — ABNORMAL HIGH (ref 11.5–15.5)
WBC: 13.5 10*3/uL — AB (ref 4.0–10.5)

## 2017-01-10 LAB — URINALYSIS, ROUTINE W REFLEX MICROSCOPIC
Glucose, UA: NEGATIVE mg/dL
HGB URINE DIPSTICK: NEGATIVE
Ketones, ur: NEGATIVE mg/dL
LEUKOCYTES UA: NEGATIVE
NITRITE: NEGATIVE
Protein, ur: 100 mg/dL — AB
SPECIFIC GRAVITY, URINE: 1.03 (ref 1.005–1.030)
pH: 5 (ref 5.0–8.0)

## 2017-01-10 LAB — LIPASE, BLOOD: LIPASE: 16 U/L (ref 11–51)

## 2017-01-10 MED ORDER — SODIUM CHLORIDE 0.9 % IV BOLUS (SEPSIS)
1000.0000 mL | Freq: Once | INTRAVENOUS | Status: AC
  Administered 2017-01-10: 1000 mL via INTRAVENOUS

## 2017-01-10 MED ORDER — MORPHINE SULFATE (PF) 4 MG/ML IV SOLN
4.0000 mg | Freq: Once | INTRAVENOUS | Status: AC
  Administered 2017-01-10: 4 mg via INTRAVENOUS
  Filled 2017-01-10: qty 1

## 2017-01-10 MED ORDER — IOPAMIDOL (ISOVUE-300) INJECTION 61%
INTRAVENOUS | Status: AC
  Filled 2017-01-10: qty 100

## 2017-01-10 NOTE — ED Notes (Signed)
Pt presents with 2 month hx of adbominal pain, starting in LUQ and radiating to LLQ and RLQ, and diarrhea.  Pt sts he takes ibuprofen for pain which helps "a little."  Pt has mild abd tenderness with palpation and hyperactive bowel sounds.

## 2017-01-10 NOTE — ED Notes (Signed)
Patient transported to CT 

## 2017-01-10 NOTE — ED Triage Notes (Signed)
The pt is c/o abd pain for 2 months with diarrhea
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
## 2017-01-11 ENCOUNTER — Encounter (HOSPITAL_COMMUNITY): Payer: Self-pay | Admitting: Internal Medicine

## 2017-01-11 ENCOUNTER — Inpatient Hospital Stay (HOSPITAL_COMMUNITY): Payer: Medicaid Other

## 2017-01-11 DIAGNOSIS — C762 Malignant neoplasm of abdomen: Principal | ICD-10-CM

## 2017-01-11 DIAGNOSIS — F17200 Nicotine dependence, unspecified, uncomplicated: Secondary | ICD-10-CM | POA: Diagnosis present

## 2017-01-11 DIAGNOSIS — E43 Unspecified severe protein-calorie malnutrition: Secondary | ICD-10-CM | POA: Diagnosis present

## 2017-01-11 DIAGNOSIS — R000 Tachycardia, unspecified: Secondary | ICD-10-CM | POA: Diagnosis present

## 2017-01-11 DIAGNOSIS — N179 Acute kidney failure, unspecified: Secondary | ICD-10-CM | POA: Diagnosis present

## 2017-01-11 DIAGNOSIS — E872 Acidosis: Secondary | ICD-10-CM | POA: Diagnosis present

## 2017-01-11 DIAGNOSIS — R627 Adult failure to thrive: Secondary | ICD-10-CM | POA: Diagnosis present

## 2017-01-11 DIAGNOSIS — D127 Benign neoplasm of rectosigmoid junction: Secondary | ICD-10-CM | POA: Diagnosis present

## 2017-01-11 DIAGNOSIS — D72829 Elevated white blood cell count, unspecified: Secondary | ICD-10-CM | POA: Diagnosis present

## 2017-01-11 DIAGNOSIS — R1900 Intra-abdominal and pelvic swelling, mass and lump, unspecified site: Secondary | ICD-10-CM | POA: Diagnosis present

## 2017-01-11 DIAGNOSIS — R509 Fever, unspecified: Secondary | ICD-10-CM | POA: Diagnosis present

## 2017-01-11 DIAGNOSIS — R1012 Left upper quadrant pain: Secondary | ICD-10-CM | POA: Diagnosis present

## 2017-01-11 DIAGNOSIS — Z6820 Body mass index (BMI) 20.0-20.9, adult: Secondary | ICD-10-CM | POA: Diagnosis not present

## 2017-01-11 DIAGNOSIS — D509 Iron deficiency anemia, unspecified: Secondary | ICD-10-CM | POA: Diagnosis present

## 2017-01-11 DIAGNOSIS — E876 Hypokalemia: Secondary | ICD-10-CM | POA: Diagnosis present

## 2017-01-11 LAB — RESPIRATORY PANEL BY PCR
ADENOVIRUS-RVPPCR: NOT DETECTED
Bordetella pertussis: NOT DETECTED
CORONAVIRUS NL63-RVPPCR: NOT DETECTED
CORONAVIRUS OC43-RVPPCR: NOT DETECTED
Chlamydophila pneumoniae: NOT DETECTED
Coronavirus 229E: NOT DETECTED
Coronavirus HKU1: NOT DETECTED
INFLUENZA A-RVPPCR: NOT DETECTED
INFLUENZA B-RVPPCR: NOT DETECTED
METAPNEUMOVIRUS-RVPPCR: NOT DETECTED
MYCOPLASMA PNEUMONIAE-RVPPCR: NOT DETECTED
PARAINFLUENZA VIRUS 1-RVPPCR: NOT DETECTED
PARAINFLUENZA VIRUS 2-RVPPCR: NOT DETECTED
PARAINFLUENZA VIRUS 3-RVPPCR: NOT DETECTED
PARAINFLUENZA VIRUS 4-RVPPCR: NOT DETECTED
RESPIRATORY SYNCYTIAL VIRUS-RVPPCR: NOT DETECTED
Rhinovirus / Enterovirus: NOT DETECTED

## 2017-01-11 LAB — CBC
HCT: 25.7 % — ABNORMAL LOW (ref 39.0–52.0)
Hemoglobin: 8.2 g/dL — ABNORMAL LOW (ref 13.0–17.0)
MCH: 22.2 pg — ABNORMAL LOW (ref 26.0–34.0)
MCHC: 31.9 g/dL (ref 30.0–36.0)
MCV: 69.6 fL — AB (ref 78.0–100.0)
PLATELETS: 236 10*3/uL (ref 150–400)
RBC: 3.69 MIL/uL — AB (ref 4.22–5.81)
RDW: 16.9 % — ABNORMAL HIGH (ref 11.5–15.5)
WBC: 12.6 10*3/uL — AB (ref 4.0–10.5)

## 2017-01-11 LAB — INFLUENZA PANEL BY PCR (TYPE A & B)
Influenza A By PCR: NEGATIVE
Influenza B By PCR: NEGATIVE

## 2017-01-11 LAB — LACTIC ACID, PLASMA
LACTIC ACID, VENOUS: 1.6 mmol/L (ref 0.5–1.9)
Lactic Acid, Venous: 2.1 mmol/L (ref 0.5–1.9)

## 2017-01-11 LAB — PROTIME-INR
INR: 1.2
PROTHROMBIN TIME: 15.3 s — AB (ref 11.4–15.2)

## 2017-01-11 LAB — PROCALCITONIN: Procalcitonin: 0.59 ng/mL

## 2017-01-11 LAB — APTT: aPTT: 34 seconds (ref 24–36)

## 2017-01-11 MED ORDER — ACETAMINOPHEN 325 MG PO TABS
650.0000 mg | ORAL_TABLET | Freq: Once | ORAL | Status: AC
  Administered 2017-01-11: 650 mg via ORAL
  Filled 2017-01-11: qty 2

## 2017-01-11 MED ORDER — BOOST / RESOURCE BREEZE PO LIQD
1.0000 | Freq: Three times a day (TID) | ORAL | Status: DC
  Administered 2017-01-12 – 2017-01-14 (×4): 1 via ORAL

## 2017-01-11 MED ORDER — IOPAMIDOL (ISOVUE-300) INJECTION 61%
INTRAVENOUS | Status: AC
  Administered 2017-01-11: 75 mL
  Filled 2017-01-11: qty 75

## 2017-01-11 MED ORDER — HYDROMORPHONE HCL 2 MG/ML IJ SOLN
1.0000 mg | INTRAMUSCULAR | Status: DC | PRN
  Administered 2017-01-11 – 2017-01-19 (×24): 1 mg via INTRAVENOUS
  Filled 2017-01-11 (×27): qty 1

## 2017-01-11 MED ORDER — PIPERACILLIN-TAZOBACTAM 3.375 G IVPB 30 MIN
3.3750 g | Freq: Once | INTRAVENOUS | Status: AC
  Administered 2017-01-11: 3.375 g via INTRAVENOUS
  Filled 2017-01-11: qty 50

## 2017-01-11 MED ORDER — ACETAMINOPHEN 325 MG PO TABS
650.0000 mg | ORAL_TABLET | Freq: Four times a day (QID) | ORAL | Status: DC | PRN
  Administered 2017-01-11 – 2017-01-13 (×4): 650 mg via ORAL
  Filled 2017-01-11 (×4): qty 2

## 2017-01-11 MED ORDER — MORPHINE SULFATE (PF) 4 MG/ML IV SOLN: 2.0000 mg | INTRAVENOUS | Status: DC | PRN

## 2017-01-11 MED ORDER — PEG 3350-KCL-NA BICARB-NACL 420 G PO SOLR
4000.0000 mL | Freq: Once | ORAL | Status: AC
  Administered 2017-01-11: 4000 mL via ORAL
  Filled 2017-01-11 (×2): qty 4000

## 2017-01-11 MED ORDER — PIPERACILLIN-TAZOBACTAM 3.375 G IVPB
3.3750 g | Freq: Three times a day (TID) | INTRAVENOUS | Status: DC
  Administered 2017-01-11 – 2017-01-15 (×12): 3.375 g via INTRAVENOUS
  Filled 2017-01-11 (×12): qty 50

## 2017-01-11 MED ORDER — VANCOMYCIN HCL IN DEXTROSE 1-5 GM/200ML-% IV SOLN
1000.0000 mg | Freq: Once | INTRAVENOUS | Status: DC
  Filled 2017-01-11: qty 200

## 2017-01-11 MED ORDER — ONDANSETRON HCL 4 MG/2ML IJ SOLN
4.0000 mg | Freq: Four times a day (QID) | INTRAMUSCULAR | Status: DC | PRN
  Administered 2017-01-11: 4 mg via INTRAVENOUS
  Filled 2017-01-11: qty 2

## 2017-01-11 MED ORDER — VANCOMYCIN HCL IN DEXTROSE 750-5 MG/150ML-% IV SOLN
750.0000 mg | Freq: Two times a day (BID) | INTRAVENOUS | Status: DC
  Administered 2017-01-11 – 2017-01-13 (×5): 750 mg via INTRAVENOUS
  Filled 2017-01-11 (×6): qty 150
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
## 2017-01-11 NOTE — Progress Notes (Signed)
Pharmacy Antibiotic Note  EMANNUEL DICICCO is a 56 y.o. male admitted on 01/10/2017 with sepsis.  Pharmacy has been consulted for vancomycin and zosyn dosing. Patient found to have malignant neoplasm of abdomen pancreatic vs colon ca. CT Chest pending  Plan: Zosyn 3.375 gm iv q8h Vancomycin 750 mg q12h Monitor renal fx, cx, ct prn  Height: 5\' 9"  (175.3 cm) Weight: 140 lb 12.8 oz (63.9 kg) IBW/kg (Calculated) : 70.7  Temp (24hrs), Avg:100.7 F (38.2 C), Min:98.6 F (37 C), Max:102.2 F (39 C)   Recent Labs Lab 01/10/17 1539 01/11/17 0524  WBC 13.5* 12.6*  CREATININE 0.95  --     Estimated Creatinine Clearance: 77.5 mL/min (by C-G formula based on SCr of 0.95 mg/dL).    No Known Allergies  Levester Fresh, PharmD, BCPS, BCCCP Clinical Pharmacist Clinical phone for 01/11/2017 from 7a-3:30p: [phone] If after 3:30p, please call main pharmacy at: [phone] 01/11/2017 9:51 AM

## 2017-01-11 NOTE — Progress Notes (Signed)
Dr. Doyle Askew notified of critical lactic acid 2.1
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
## 2017-01-11 NOTE — ED Notes (Signed)
Admitting Provider at bedside. 

## 2017-01-11 NOTE — Progress Notes (Addendum)
No nausea or vomiting, continue LUQ pain. Discussed plan for work up over the next few days -clear liquids until Gi consult complete -added biliary tumor markers
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
## 2017-01-11 NOTE — ED Notes (Signed)
Attempted report x1.  Name and callback number provided.   
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
## 2017-01-12 ENCOUNTER — Encounter (HOSPITAL_COMMUNITY): Payer: Self-pay | Admitting: *Deleted

## 2017-01-12 ENCOUNTER — Encounter (HOSPITAL_COMMUNITY)
Admission: EM | Disposition: A | Discharge status: Home / Self Care | Payer: Self-pay | Source: Home / Self Care | Attending: Internal Medicine

## 2017-01-12 DIAGNOSIS — C189 Malignant neoplasm of colon, unspecified: Secondary | ICD-10-CM | POA: Insufficient documentation

## 2017-01-12 HISTORY — PX: COLONOSCOPY: SHX5424

## 2017-01-12 LAB — BASIC METABOLIC PANEL
ANION GAP: 9 (ref 5–15)
BUN: 6 mg/dL (ref 6–20)
CALCIUM: 8.6 mg/dL — AB (ref 8.9–10.3)
CO2: 23 mmol/L (ref 22–32)
Chloride: 98 mmol/L — ABNORMAL LOW (ref 101–111)
Creatinine, Ser: 0.92 mg/dL (ref 0.61–1.24)
Glucose, Bld: 120 mg/dL — ABNORMAL HIGH (ref 65–99)
Potassium: 3.3 mmol/L — ABNORMAL LOW (ref 3.5–5.1)
Sodium: 130 mmol/L — ABNORMAL LOW (ref 135–145)

## 2017-01-12 LAB — BLOOD CULTURE ID PANEL (REFLEXED)
ACINETOBACTER BAUMANNII: NOT DETECTED
CANDIDA ALBICANS: NOT DETECTED
CANDIDA GLABRATA: NOT DETECTED
Candida krusei: NOT DETECTED
Candida parapsilosis: NOT DETECTED
Candida tropicalis: NOT DETECTED
ENTEROBACTER CLOACAE COMPLEX: NOT DETECTED
ENTEROBACTERIACEAE SPECIES: NOT DETECTED
ENTEROCOCCUS SPECIES: NOT DETECTED
Escherichia coli: NOT DETECTED
HAEMOPHILUS INFLUENZAE: NOT DETECTED
Klebsiella oxytoca: NOT DETECTED
Klebsiella pneumoniae: NOT DETECTED
LISTERIA MONOCYTOGENES: NOT DETECTED
METHICILLIN RESISTANCE: NOT DETECTED
NEISSERIA MENINGITIDIS: NOT DETECTED
Proteus species: NOT DETECTED
Pseudomonas aeruginosa: NOT DETECTED
STREPTOCOCCUS AGALACTIAE: NOT DETECTED
STREPTOCOCCUS PYOGENES: NOT DETECTED
STREPTOCOCCUS SPECIES: NOT DETECTED
Serratia marcescens: NOT DETECTED
Staphylococcus aureus (BCID): NOT DETECTED
Staphylococcus species: DETECTED — AB
Streptococcus pneumoniae: NOT DETECTED

## 2017-01-12 LAB — FERRITIN: Ferritin: 123 ng/mL (ref 24–336)

## 2017-01-12 LAB — LACTIC ACID, PLASMA
LACTIC ACID, VENOUS: 0.9 mmol/L (ref 0.5–1.9)
Lactic Acid, Venous: 0.9 mmol/L (ref 0.5–1.9)

## 2017-01-12 LAB — FOLATE: FOLATE: 12.7 ng/mL (ref >5.9)

## 2017-01-12 LAB — VITAMIN B12: VITAMIN B 12: 328 pg/mL (ref 180–914)

## 2017-01-12 LAB — IRON AND TIBC
IRON: 6 ug/dL — AB (ref 45–182)
Saturation Ratios: 2 % — ABNORMAL LOW (ref 17.9–39.5)
TIBC: 300 ug/dL (ref 250–450)
UIBC: 294 ug/dL

## 2017-01-12 LAB — AFP TUMOR MARKER: AFP TUMOR MARKER: 1.3 ng/mL (ref 0.0–8.3)

## 2017-01-12 LAB — CANCER ANTIGEN 19-9
CA 19 9: 11 U/mL (ref 0–35)
CA 19-9: 11 U/mL (ref 0–35)

## 2017-01-12 LAB — CBC
HEMATOCRIT: 26.2 % — AB (ref 39.0–52.0)
Hemoglobin: 8 g/dL — ABNORMAL LOW (ref 13.0–17.0)
MCH: 21.2 pg — ABNORMAL LOW (ref 26.0–34.0)
MCHC: 30.5 g/dL (ref 30.0–36.0)
MCV: 69.5 fL — ABNORMAL LOW (ref 78.0–100.0)
Platelets: 245 10*3/uL (ref 150–400)
RBC: 3.77 MIL/uL — ABNORMAL LOW (ref 4.22–5.81)
RDW: 17.2 % — AB (ref 11.5–15.5)
WBC: 13.8 10*3/uL — AB (ref 4.0–10.5)

## 2017-01-12 LAB — CEA: CEA: 10.7 ng/mL — AB (ref 0.0–4.7)

## 2017-01-12 LAB — RETICULOCYTES
RBC.: 3.77 MIL/uL — AB (ref 4.22–5.81)
RETIC COUNT ABSOLUTE: 37.7 10*3/uL (ref 19.0–186.0)
Retic Ct Pct: 1 % (ref 0.4–3.1)

## 2017-01-12 SURGERY — COLONOSCOPY
Anesthesia: Moderate Sedation | Laterality: Left

## 2017-01-12 MED ORDER — DIPHENHYDRAMINE HCL 50 MG/ML IJ SOLN
INTRAMUSCULAR | Status: AC
  Filled 2017-01-12: qty 1

## 2017-01-12 MED ORDER — MIDAZOLAM HCL 5 MG/ML IJ SOLN
INTRAMUSCULAR | Status: AC
  Filled 2017-01-12: qty 2

## 2017-01-12 MED ORDER — POTASSIUM CHLORIDE CRYS ER 20 MEQ PO TBCR
40.0000 meq | EXTENDED_RELEASE_TABLET | Freq: Once | ORAL | Status: AC
  Administered 2017-01-12: 40 meq via ORAL
  Filled 2017-01-12: qty 2

## 2017-01-12 MED ORDER — SPOT INK MARKER SYRINGE KIT
PACK | SUBMUCOSAL | Status: DC | PRN
  Administered 2017-01-12: 5 mL via SUBMUCOSAL

## 2017-01-12 MED ORDER — SPOT INK MARKER SYRINGE KIT
PACK | SUBMUCOSAL | Status: AC
  Filled 2017-01-12: qty 10

## 2017-01-12 MED ORDER — MIDAZOLAM HCL 5 MG/5ML IJ SOLN
INTRAMUSCULAR | Status: DC | PRN
  Administered 2017-01-12 (×2): 2 mg via INTRAVENOUS
  Administered 2017-01-12: 1 mg via INTRAVENOUS

## 2017-01-12 MED ORDER — SODIUM CHLORIDE 0.9 % IV SOLN: INTRAVENOUS | Status: DC

## 2017-01-12 MED ORDER — FENTANYL CITRATE (PF) 100 MCG/2ML IJ SOLN
INTRAMUSCULAR | Status: DC | PRN
  Administered 2017-01-12 (×3): 25 ug via INTRAVENOUS

## 2017-01-12 MED ORDER — SODIUM CHLORIDE 0.9 % IV BOLUS (SEPSIS)
500.0000 mL | Freq: Once | INTRAVENOUS | Status: AC
  Administered 2017-01-12: 500 mL via INTRAVENOUS

## 2017-01-12 MED ORDER — FENTANYL CITRATE (PF) 100 MCG/2ML IJ SOLN
INTRAMUSCULAR | Status: AC
  Filled 2017-01-12: qty 2
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
## 2017-01-12 NOTE — Progress Notes (Signed)
PHARMACY - PHYSICIAN COMMUNICATION CRITICAL VALUE ALERT - BLOOD CULTURE IDENTIFICATION (BCID)  Results for orders placed or performed during the hospital encounter of 01/10/17  Blood Culture ID Panel (Reflexed) (Collected: 01/11/2017 10:22 AM)  Result Value Ref Range   Enterococcus species NOT DETECTED NOT DETECTED   Listeria monocytogenes NOT DETECTED NOT DETECTED   Staphylococcus species DETECTED (A) NOT DETECTED   Staphylococcus aureus NOT DETECTED NOT DETECTED   Methicillin resistance NOT DETECTED NOT DETECTED   Streptococcus species NOT DETECTED NOT DETECTED   Streptococcus agalactiae NOT DETECTED NOT DETECTED   Streptococcus pneumoniae NOT DETECTED NOT DETECTED   Streptococcus pyogenes NOT DETECTED NOT DETECTED   Acinetobacter baumannii NOT DETECTED NOT DETECTED   Enterobacteriaceae species NOT DETECTED NOT DETECTED   Enterobacter cloacae complex NOT DETECTED NOT DETECTED   Escherichia coli NOT DETECTED NOT DETECTED   Klebsiella oxytoca NOT DETECTED NOT DETECTED   Klebsiella pneumoniae NOT DETECTED NOT DETECTED   Proteus species NOT DETECTED NOT DETECTED   Serratia marcescens NOT DETECTED NOT DETECTED   Haemophilus influenzae NOT DETECTED NOT DETECTED   Neisseria meningitidis NOT DETECTED NOT DETECTED   Pseudomonas aeruginosa NOT DETECTED NOT DETECTED   Candida albicans NOT DETECTED NOT DETECTED   Candida glabrata NOT DETECTED NOT DETECTED   Candida krusei NOT DETECTED NOT DETECTED   Candida parapsilosis NOT DETECTED NOT DETECTED   Candida tropicalis NOT DETECTED NOT DETECTED    Name of physician (or Provider) Contacted: Dr. Garwin Brothers  Changes to prescribed antibiotics required: Patient on vancomycin and Zosyn. Continue until cultures finalize.   Sloan Leiter, PharmD, BCPS Clinical Pharmacist Clinical Phone today until 4 PM [phone] After hours, please call [phone] 01/12/2017  7:51 AM
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
## 2017-01-12 NOTE — Progress Notes (Signed)
Initial Nutrition Assessment  DOCUMENTATION CODES:   Severe malnutrition in context of chronic illness  INTERVENTION:  Resume Boost Breeze TID once diet is advanced (each supplement provides 250 kcal and 9 g protein)   NUTRITION DIAGNOSIS:   Malnutrition related to chronic illness as evidenced by severe depletion of body fat, severe depletion of muscle mass.   GOAL:   Patient will meet greater than or equal to 90% of their needs   MONITOR:   PO intake, Diet advancement, Weight trends, I & O's, Labs, Supplement acceptance  REASON FOR ASSESSMENT:   Malnutrition Screening Tool    ASSESSMENT:   56 y.o. previously healthy, admitted with abdominal pain, CT scan showed very large malignant neoplasm on abdomin.   Pt reports UBW is 159 and recent weight loss due to inconsistent oral intake. Reported history reveals 12% wt loss but unable to confirm time frame of weight loss due to lack of weight history in chart. Pt reports regular alcohol consumption of beer (6 pack/d). Per NFPE pt had severe depletion in the orbital and temple regions with mild to moderate depletion in the thoracic/lumbar, clavicle bone, patellar, anterior thigh, and posterior calf region. Pt is currently NPO, colonoscopy scheduled for today 1/16. Will monitor diet advancement from NPO and supplement as appropriate. Pt willing to accept nutrition supplement once diet is advanced due to concern of his weight loss.   Diet Order:  Diet NPO time specified  Skin:  Reviewed, no issues  Last BM:  1/15  Height:   Ht Readings from Last 1 Encounters:  01/11/17 5\' 9"  (1.753 m)    Weight:   Wt Readings from Last 1 Encounters:  01/11/17 140 lb 12.8 oz (63.9 kg)    Ideal Body Weight:  72.7 kg  BMI:  Body mass index is 20.79 kg/m.  Estimated Nutritional Needs:   Kcal:  1900-2100  Protein:  95-105 g  Fluid:  1.9-2.1 L  EDUCATION NEEDS:   Education needs addressed  [employer] Dietetic Intern

## 2017-01-12 NOTE — Progress Notes (Signed)
Pt on bowel prep for colonoscopy. Pt vomited x 1 and feeling nauseated. Made Lamar Blinks NP aware. Ordered Zofran PRN for nausea. Given to pt. Will continue to monitor.

## 2017-01-12 NOTE — Progress Notes (Signed)
Pt half way through the bowel prep. Encourage pt to take some more. Pt states that bowel prep makes him nauseated and he will keep working on it.

## 2017-01-12 NOTE — Op Note (Signed)
[facility] Patient Name: Jeff [name] Procedure Date : 01/12/2017 MRN: [id number] Attending MD: [name] , MD Date of Birth: [birth year] CSN: [id number] Age: 56 Admit Type: Inpatient Procedure:                Colonoscopy Indications:              Abnormal CT of the GI tract Providers:                [name], MD, Burtis Junes, RN, William Dalton,                            Technician Referring MD:              Medicines:                Fentanyl 50 micrograms IV, Midazolam 4 mg IV Complications:            No immediate complications. Estimated Blood Loss:     Estimated blood loss was minimal. Procedure:                Pre-Anesthesia Assessment:                           - Prior to the procedure, a History and Physical                            was performed, and patient medications and                            allergies were reviewed. The patient's tolerance of                            previous anesthesia was also reviewed. The risks                            and benefits of the procedure and the sedation                            options and risks were discussed with the patient.                            All questions were answered, and informed consent                            was obtained. Prior Anticoagulants: The patient has                            taken no previous anticoagulant or antiplatelet                            agents. ASA Grade Assessment: III - A patient with                            severe systemic disease. After reviewing the risks  and benefits, the patient was deemed in                            satisfactory condition to undergo the procedure.                           - Sedation was administered by an anesthesia                            professional. The sedation level attained was                            moderate.                           After obtaining informed consent, the colonoscope                          was passed under direct vision. Throughout the                            procedure, the patient's blood pressure, pulse, and                            oxygen saturations were monitored continuously. The                            [id number] scope was introduced through                            the anus and advanced to the the cecum, identified                            by appendiceal orifice and ileocecal valve. The                            colonoscopy was somewhat difficult. The patient                            tolerated the procedure well. The quality of the                            bowel preparation was excellent. The ileocecal                            valve, appendiceal orifice, and rectum were                            photographed. Scope In: 3:37:00 PM Scope Out: 4:07:17 PM Scope Withdrawal Time: 0 hours 23 minutes 9 seconds  Total Procedure Duration: 0 hours 30 minutes 17 seconds  Findings:      A fungating, infiltrative and ulcerated near obstructing large mass was       found in the descending colon and at the splenic flexure. The mass was       circumferential.  The mass measured five cm in length. Oozing was       present. Biopsies were taken with a cold forceps for histology. Area was       tattooed with an injection of 5 mL of Spot (carbon black).      A 25 mm polyp was found in the recto-sigmoid colon. The polyp was       pedunculated. The polyp was removed with a hot snare. Resection and       retrieval were complete. To stop active bleeding, three hemostatic clips       were successfully placed (MR unsafe). There was no bleeding at the end       of the procedure. Impression:               - Malignant near obstructing tumor in the                            descending colon and at the splenic flexure.                            Biopsied. Tattooed.                           - One 25 mm polyp at the recto-sigmoid colon,                             removed with a hot snare. Resected and retrieved.                            Clips (MR unsafe) were placed. Moderate Sedation:      Moderate (conscious) sedation was administered by the endoscopy nurse       and supervised by the endoscopist. The following parameters were       monitored: oxygen saturation, heart rate, blood pressure, and response       to care. Recommendation:           - Return patient to hospital ward for ongoing care.                           - Clear liquid diet.                           - Continue present medications.                           - Await pathology results.                           - Surgical and Oncologic consultation.                           - Repeat colonoscopy in 1 year for surveillance. Procedure Code(s):        --- Professional ---                           [phone], Colonoscopy, flexible; with removal of  tumor(s), polyp(s), or other lesion(s) by snare                            technique                           [id number], Colonoscopy, flexible; with directed                            submucosal injection(s), any substance                           [id number], 59, Colonoscopy, flexible; with biopsy,                            single or multiple Diagnosis Code(s):        --- Professional ---                           C18.6, Malignant neoplasm of descending colon                           C18.5, Malignant neoplasm of splenic flexure                           D12.7, Benign neoplasm of rectosigmoid junction                           R93.3, Abnormal findings on diagnostic imaging of                            other parts of digestive tract CPT copyright 2016 [employer]. All rights reserved. The codes documented in this report are preliminary and upon coder review may  be revised to meet current compliance requirements. [name], MD [name], MD 01/12/2017 4:24:05 PM This report has  been signed electronically. Number of Addenda: 0

## 2017-01-13 ENCOUNTER — Encounter (HOSPITAL_COMMUNITY): Payer: Self-pay | Admitting: Gastroenterology

## 2017-01-13 DIAGNOSIS — E43 Unspecified severe protein-calorie malnutrition: Secondary | ICD-10-CM | POA: Insufficient documentation

## 2017-01-13 LAB — BASIC METABOLIC PANEL
Anion gap: 10 (ref 5–15)
BUN: 5 mg/dL — ABNORMAL LOW (ref 6–20)
CHLORIDE: 101 mmol/L (ref 101–111)
CO2: 22 mmol/L (ref 22–32)
CREATININE: 0.86 mg/dL (ref 0.61–1.24)
Calcium: 8.5 mg/dL — ABNORMAL LOW (ref 8.9–10.3)
GFR calc non Af Amer: >60 mL/min (ref >60)
Glucose, Bld: 96 mg/dL (ref 65–99)
Potassium: 3.2 mmol/L — ABNORMAL LOW (ref 3.5–5.1)
SODIUM: 133 mmol/L — AB (ref 135–145)

## 2017-01-13 LAB — ABO/RH: ABO/RH(D): O POS

## 2017-01-13 LAB — CBC
HCT: 24.4 % — ABNORMAL LOW (ref 39.0–52.0)
HEMOGLOBIN: 7.6 g/dL — AB (ref 13.0–17.0)
MCH: 21.6 pg — ABNORMAL LOW (ref 26.0–34.0)
MCHC: 31.1 g/dL (ref 30.0–36.0)
MCV: 69.3 fL — ABNORMAL LOW (ref 78.0–100.0)
Platelets: 250 10*3/uL (ref 150–400)
RBC: 3.52 MIL/uL — AB (ref 4.22–5.81)
RDW: 17.4 % — ABNORMAL HIGH (ref 11.5–15.5)
WBC: 12.2 10*3/uL — AB (ref 4.0–10.5)

## 2017-01-13 LAB — PREPARE RBC (CROSSMATCH)

## 2017-01-13 MED ORDER — FERROUS SULFATE 325 (65 FE) MG PO TABS
325.0000 mg | ORAL_TABLET | Freq: Two times a day (BID) | ORAL | Status: DC
  Administered 2017-01-13 – 2017-01-19 (×13): 325 mg via ORAL
  Filled 2017-01-13 (×13): qty 1

## 2017-01-13 MED ORDER — POTASSIUM CHLORIDE CRYS ER 20 MEQ PO TBCR
40.0000 meq | EXTENDED_RELEASE_TABLET | Freq: Once | ORAL | Status: AC
  Administered 2017-01-13: 40 meq via ORAL
  Filled 2017-01-13: qty 2

## 2017-01-13 MED ORDER — SODIUM CHLORIDE 0.9 % IV SOLN
Freq: Once | INTRAVENOUS | Status: AC
  Administered 2017-01-13: 11:00:00 via INTRAVENOUS
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
## 2017-01-13 NOTE — Progress Notes (Signed)
[facility] Progress Note   Subjective: Pt still complaining of abdominal pain. Still having loose BM's and flatus. No nausea or vomiting. He is tolerating clears. No complaints of fever or chills overnight although pt was febrile overnight.   Objective: Vital signs in last 24 hours: Temp:  [97.6 F (36.4 C)-102.1 F (38.9 C)] 99.6 F (37.6 C) (01/17 0505) Pulse Rate:  [87-96] 93 (01/17 0505) Resp:  [10-25] 17 (01/17 0505) BP: (101-126)/(60-83) 108/67 (01/17 0505) SpO2:  [98 %-100 %] 100 % (01/17 0505) Last BM Date: 01/12/17  Intake/Output from previous day: 01/16 0701 - 01/17 0700 In: 499 [P.O.:50; IV Piggyback:449] Out: -  Intake/Output this shift: No intake/output data recorded.  PE: Gen:  Alert, NAD, pleasant, cooperative, lying in bed Card:  RRR, no M/G/R heard Pulm:  Rate and effort normal Abd: Soft, firm mass noted in the LUQ, +BS, mild generalized TTP with greater TTP in the LUQ   Lab Results:   Recent Labs  01/12/17 0518 01/13/17 0437  WBC 13.8* 12.2*  HGB 8.0* 7.6*  HCT 26.2* 24.4*  PLT 245 250   BMET  Recent Labs  01/12/17 0518 01/13/17 0437  NA 130* 133*  K 3.3* 3.2*  CL 98* 101  CO2 23 22  GLUCOSE 120* 96  BUN 6 5*  CREATININE 0.92 0.86  CALCIUM 8.6* 8.5*   PT/INR  Recent Labs  01/11/17 1009  LABPROT 15.3*  INR 1.20   CMP     Component Value Date/Time   NA 133 (L) 01/13/2017 0437   K 3.2 (L) 01/13/2017 0437   CL 101 01/13/2017 0437   CO2 22 01/13/2017 0437   GLUCOSE 96 01/13/2017 0437   BUN 5 (L) 01/13/2017 0437   CREATININE 0.86 01/13/2017 0437   CALCIUM 8.5 (L) 01/13/2017 0437   PROT 8.3 (H) 01/10/2017 1539   ALBUMIN 2.8 (L) 01/10/2017 1539   AST 21 01/10/2017 1539   ALT 7 (L) 01/10/2017 1539   ALKPHOS 70 01/10/2017 1539   BILITOT 0.8 01/10/2017 1539   GFRNONAA >60 01/13/2017 0437   GFRAA >60 01/13/2017 0437   Lipase     Component Value Date/Time   LIPASE 16 01/10/2017 1539        Studies/Results: Ct Chest W Contrast  Result Date: 01/11/2017 CLINICAL DATA:  Stomach cancer and colon mass EXAM: CT CHEST WITH CONTRAST TECHNIQUE: Multidetector CT imaging of the chest was performed during intravenous contrast administration. CONTRAST:  32mL ISOVUE-300 IOPAMIDOL (ISOVUE-300) INJECTION 61% COMPARISON:  CT abdomen pelvis 01/10/2017 FINDINGS: Cardiovascular: Thoracic aorta is non aneurysmal. No dissection. Minimal coronary artery calcifications. The heart is nonenlarged. There is no pericardial effusion. Mediastinum/Nodes: Right cardio phrenic lymph node measuring 9 mm. Subcentimeter AP window lymph nodes. 1 cm lymph node right hilum. Trachea and mainstem bronchi appear normal. The esophagus is within normal limits. Lungs/Pleura: No pulmonary nodules. No acute infiltrate or effusion. No pneumothorax. Upper Abdomen: Partially visualized mass at the splenic hilum/ tail of the pancreas. Prominent collateral vessels anterior to the liver as before. Musculoskeletal: No suspicious bone lesions. IMPRESSION: 1. No evidence for pulmonary nodules. 2. Subcentimeter mediastinal and right hilar lymph nodes. Mildly prominent right cardio phrenic lymph node. 3. Partially visualized heterogenous mass at the splenic hilum/tail of the pancreas. Electronically Signed   By: Donavan Foil M.D.   On: 01/11/2017 23:24    Anti-infectives: Anti-infectives    Start     Dose/Rate Route Frequency Ordered Stop   01/11/17 1800  piperacillin-tazobactam (ZOSYN) IVPB 3.375  g     3.375 g 12.5 mL/hr over 240 Minutes Intravenous Every 8 hours 01/11/17 0950     01/11/17 1100  piperacillin-tazobactam (ZOSYN) IVPB 3.375 g     3.375 g 100 mL/hr over 30 Minutes Intravenous  Once 01/11/17 0944 01/11/17 1153   01/11/17 1030  vancomycin (VANCOCIN) IVPB 1000 mg/200 mL premix  Status:  Discontinued     1,000 mg 200 mL/hr over 60 Minutes Intravenous  Once 01/11/17 0944 01/11/17 0950   01/11/17 1030  vancomycin  (VANCOCIN) IVPB 750 mg/150 ml premix     750 mg 150 mL/hr over 60 Minutes Intravenous Every 12 hours 01/11/17 0950         Assessment/Plan  Malignant neoplasm of abdomen ([facility]) - Left upper quadrant abdominal mass involving splenic flexure, pancreatic tail, splenic hilum; Pancreatic vs colon - 8 mm right liver lesion - Gastroenterology  - colonoscopy yesterday concerning for malignancy. Partially obstructing mass in the descending colon and at the splenic flexure. Pathology pending - Oncology, Dr. Mckinley Jewel recommends waiting for pathology before official consult but will likely be followed outpt - CEA elevated Fever - with tachycardia, WBC > 12, elevated lactic acid Microcytic anemia - malignant source?   FEN: clears ID: Zosyn 1/15>>    Vanc 1/15>>  Pt is having BM's and there is no radiological evidence of obstruction. Will await pathology results before surgical intervention.   LOS: 2 days    Kalman Drape , [facility] 01/13/2017, 9:01 AM Pager: [phone] Consults: [phone] Mon-Fri 7:00 am-4:30 pm Sat-Sun 7:00 am-11:30 am

## 2017-01-14 LAB — CBC
HEMATOCRIT: 25.6 % — AB (ref 39.0–52.0)
HEMOGLOBIN: 8 g/dL — AB (ref 13.0–17.0)
MCH: 22.2 pg — ABNORMAL LOW (ref 26.0–34.0)
MCHC: 31.3 g/dL (ref 30.0–36.0)
MCV: 71.1 fL — AB (ref 78.0–100.0)
Platelets: 257 10*3/uL (ref 150–400)
RBC: 3.6 MIL/uL — AB (ref 4.22–5.81)
RDW: 18 % — ABNORMAL HIGH (ref 11.5–15.5)
WBC: 10.3 10*3/uL (ref 4.0–10.5)

## 2017-01-14 LAB — TYPE AND SCREEN
ABO/RH(D): O POS
ANTIBODY SCREEN: NEGATIVE
Unit division: 0

## 2017-01-14 LAB — BASIC METABOLIC PANEL
ANION GAP: 8 (ref 5–15)
BUN: 7 mg/dL (ref 6–20)
CHLORIDE: 104 mmol/L (ref 101–111)
CO2: 22 mmol/L (ref 22–32)
Calcium: 8.6 mg/dL — ABNORMAL LOW (ref 8.9–10.3)
Creatinine, Ser: 1.48 mg/dL — ABNORMAL HIGH (ref 0.61–1.24)
GFR calc Af Amer: 59 mL/min — ABNORMAL LOW (ref >60)
GFR calc non Af Amer: 51 mL/min — ABNORMAL LOW (ref >60)
GLUCOSE: 106 mg/dL — AB (ref 65–99)
POTASSIUM: 3.5 mmol/L (ref 3.5–5.1)
Sodium: 134 mmol/L — ABNORMAL LOW (ref 135–145)

## 2017-01-14 LAB — CULTURE, BLOOD (ROUTINE X 2)

## 2017-01-14 MED ORDER — ENSURE ENLIVE PO LIQD
237.0000 mL | Freq: Two times a day (BID) | ORAL | Status: DC
  Administered 2017-01-18 – 2017-01-19 (×3): 237 mL via ORAL

## 2017-01-14 MED ORDER — SODIUM CHLORIDE 0.9 % IV SOLN
INTRAVENOUS | Status: AC
  Administered 2017-01-14: 12:00:00 via INTRAVENOUS

## 2017-01-14 MED ORDER — POTASSIUM CHLORIDE CRYS ER 20 MEQ PO TBCR
40.0000 meq | EXTENDED_RELEASE_TABLET | Freq: Once | ORAL | Status: AC
  Administered 2017-01-14: 40 meq via ORAL
  Filled 2017-01-14: qty 2

## 2017-01-14 NOTE — Progress Notes (Signed)
[facility] Progress Note    Subjective: No changes in abdominal pain although pt states pain is better since admission. He is still having watery/loose bowel movements. Tolerating clears. Would like regular food. I explained why he is on fulls.   Objective: Vital signs in last 24 hours: Temp:  [98.9 F (37.2 C)-101.7 F (38.7 C)] 98.9 F (37.2 C) (01/18 0548) Pulse Rate:  [89-93] 91 (01/18 0548) Resp:  [16-18] 18 (01/18 0548) BP: (102-119)/(64-86) 102/64 (01/18 0548) SpO2:  [100 %] 100 % (01/18 0548) Last BM Date: 01/13/17  Intake/Output from previous day: 01/17 0701 - 01/18 0700 In: 1500 [P.O.:1080; I.V.:50; Blood:320; IV Piggyback:50] Out: -  Intake/Output this shift: No intake/output data recorded.  PE: Gen:  Alert, NAD, pleasant, cooperative, lying in bed Card:  RRR, no M/G/R heard Pulm:  Rate and effort normal Abd: Soft, firm mass noted in the LUQ, +BS, mild generalized TTP with greater TTP in the LUQ  Lab Results:   Recent Labs  01/12/17 0518 01/13/17 0437  WBC 13.8* 12.2*  HGB 8.0* 7.6*  HCT 26.2* 24.4*  PLT 245 250   BMET  Recent Labs  01/13/17 0437 01/14/17 0543  NA 133* 134*  K 3.2* 3.5  CL 101 104  CO2 22 22  GLUCOSE 96 106*  BUN 5* 7  CREATININE 0.86 1.48*  CALCIUM 8.5* 8.6*   PT/INR  Recent Labs  01/11/17 1009  LABPROT 15.3*  INR 1.20   CMP     Component Value Date/Time   NA 134 (L) 01/14/2017 0543   K 3.5 01/14/2017 0543   CL 104 01/14/2017 0543   CO2 22 01/14/2017 0543   GLUCOSE 106 (H) 01/14/2017 0543   BUN 7 01/14/2017 0543   CREATININE 1.48 (H) 01/14/2017 0543   CALCIUM 8.6 (L) 01/14/2017 0543   PROT 8.3 (H) 01/10/2017 1539   ALBUMIN 2.8 (L) 01/10/2017 1539   AST 21 01/10/2017 1539   ALT 7 (L) 01/10/2017 1539   ALKPHOS 70 01/10/2017 1539   BILITOT 0.8 01/10/2017 1539   GFRNONAA 51 (L) 01/14/2017 0543   GFRAA 59 (L) 01/14/2017 0543   Lipase     Component Value Date/Time   LIPASE 16 01/10/2017 1539        Studies/Results: No results found.  Anti-infectives: Anti-infectives    Start     Dose/Rate Route Frequency Ordered Stop   01/11/17 1800  piperacillin-tazobactam (ZOSYN) IVPB 3.375 g     3.375 g 12.5 mL/hr over 240 Minutes Intravenous Every 8 hours 01/11/17 0950     01/11/17 1100  piperacillin-tazobactam (ZOSYN) IVPB 3.375 g     3.375 g 100 mL/hr over 30 Minutes Intravenous  Once 01/11/17 0944 01/11/17 1153   01/11/17 1030  vancomycin (VANCOCIN) IVPB 1000 mg/200 mL premix  Status:  Discontinued     1,000 mg 200 mL/hr over 60 Minutes Intravenous  Once 01/11/17 0944 01/11/17 0950   01/11/17 1030  vancomycin (VANCOCIN) IVPB 750 mg/150 ml premix  Status:  Discontinued     750 mg 150 mL/hr over 60 Minutes Intravenous Every 12 hours 01/11/17 0950 01/13/17 1540       Assessment/Plan  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 Partially obstructing mass in the descending colon and at the splenic flexure. Pathology pending - Oncology, Dr. Mckinley Jewel recommends waiting for pathology before  official consult but will likely be followed outpt - CEA elevated Fever - with tachycardia, leukocytosis, lactic acidosis resolved Microcytic anemia - malignant source, was transfused yesterday   FEN: fulls ID: Zosyn 1/15>>Vanc 1/15>>1/17  Pt is having BM's and there is no radiological evidence of obstruction. Pathology pending. Continue full liquids. We will continue to follow this patient.   LOS: 3 days    Kalman Drape , [facility] 01/14/2017, 8:05 AM Pager: [phone] Consults: [phone] Mon-Fri 7:00 am-4:30 pm Sat-Sun 7:00 am-11:30 am
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
## 2017-01-14 NOTE — Progress Notes (Signed)
Brief Nutrition Follow-Up Note  Chart reviewed. RD last evaluated pt on 1.16/18 (see initial assessment for further details). Pt as/p colonoscopy on 01/12/17 which revealed malignant near obstructing tumor in descending colon and at splenic flexure as well as polyp at rectosigmoid colon. Both areas have been biopsied. Per surgeon notes, no plans for surgical interventions due to pt having bowel movements. Oncology plans to consult pending results of biopsies.   Pt was advanced to a full liquid diet on 01/13/17. Intake variable; PO: 5-100%. Pt is accepting Boost Breeze supplements. RD will add Ensure Enlive po BID, each supplement provides 350 kcal and 20 grams of protein, due to diet advancement and supplement's increased nutritional density.   RD will continue to follow.   Alanee Ting A. Jimmye Norman, RD, LDN, CDE Pager: [phone] After hours Pager: [phone]

## 2017-01-14 NOTE — Progress Notes (Signed)
Pharmacy Antibiotic Note  Jeff [name] is a 56 y.o. male admitted on 01/10/2017 with sepsis.  Pharmacy has been consulted for zosyn dosing.  SCr jumped to 1.48 this morning, CrCl ~45-49mL/min.  Plan: Zosyn 3.375g IV q8h EI Monitor renal fx, c/s, LOT  Height: 5\' 9"  (175.3 cm) Weight: 140 lb 12.8 oz (63.9 kg) IBW/kg (Calculated) : 70.7  Temp (24hrs), Avg:100.1 F (37.8 C), Min:98.9 F (37.2 C), Max:101.7 F (38.7 C)   Recent Labs Lab 01/10/17 1539 01/11/17 0524 01/11/17 1009 01/11/17 1442 01/12/17 0518 01/12/17 1657 01/12/17 1915 01/13/17 0437 01/14/17 0543  WBC 13.5* 12.6*  --   --  13.8*  --   --  12.2* 10.3  CREATININE 0.95  --   --   --  0.92  --   --  0.86 1.48*  LATICACIDVEN  --   --  1.6 2.1*  --  0.9 0.9  --   --     Estimated Creatinine Clearance: 49.8 mL/min (by C-G formula based on SCr of 1.48 mg/dL (H)).    No Known Allergies Antimicrobials this admission:  Zosyn 1/15>> Vancomycin 1/15>>11/17  Dose adjustments this admission:  N/a   Microbiology results:  1/15 Blood x 2 - 2/2 CoNS 1/15 Resp PCR - negative  Tyrese Ficek D. Shamon Cothran, PharmD, BCPS Clinical Pharmacist Pager: [phone] 01/14/2017 11:04 AM

## 2017-01-15 ENCOUNTER — Inpatient Hospital Stay (HOSPITAL_COMMUNITY): Payer: Medicaid Other

## 2017-01-15 LAB — BASIC METABOLIC PANEL
ANION GAP: 6 (ref 5–15)
BUN: 5 mg/dL — ABNORMAL LOW (ref 6–20)
CO2: 23 mmol/L (ref 22–32)
Calcium: 8.8 mg/dL — ABNORMAL LOW (ref 8.9–10.3)
Chloride: 107 mmol/L (ref 101–111)
Creatinine, Ser: 1.55 mg/dL — ABNORMAL HIGH (ref 0.61–1.24)
GFR calc Af Amer: 56 mL/min — ABNORMAL LOW (ref >60)
GFR, EST NON AFRICAN AMERICAN: 48 mL/min — AB (ref >60)
Glucose, Bld: 114 mg/dL — ABNORMAL HIGH (ref 65–99)
POTASSIUM: 3.8 mmol/L (ref 3.5–5.1)
SODIUM: 136 mmol/L (ref 135–145)

## 2017-01-15 LAB — CBC
HEMATOCRIT: 26.2 % — AB (ref 39.0–52.0)
HEMOGLOBIN: 8.1 g/dL — AB (ref 13.0–17.0)
MCH: 22.1 pg — AB (ref 26.0–34.0)
MCHC: 30.9 g/dL (ref 30.0–36.0)
MCV: 71.4 fL — ABNORMAL LOW (ref 78.0–100.0)
Platelets: 249 10*3/uL (ref 150–400)
RBC: 3.67 MIL/uL — ABNORMAL LOW (ref 4.22–5.81)
RDW: 18 % — ABNORMAL HIGH (ref 11.5–15.5)
WBC: 7.2 10*3/uL (ref 4.0–10.5)

## 2017-01-15 MED ORDER — GADOBENATE DIMEGLUMINE 529 MG/ML IV SOLN
15.0000 mL | Freq: Once | INTRAVENOUS | Status: AC
  Administered 2017-01-15: 14 mL via INTRAVENOUS

## 2017-01-15 NOTE — Progress Notes (Signed)
Patient ID: Jeff [name], male   [name]: 10/16/1961, 56 y.o.   MRN: [id number]    PROGRESS NOTE    Jeff [name]  [id number] [name]: 07-05-1961 [name]: 01/10/2017  PCP: [name] Patient   Brief Narrative:  Pt admitted after midnight, please see detailed admission ont by Dr. Alcario Drought. In brief, pt presented with several weeks duration of progressive LUQ pain associated with poor oral intake and weight loss. In ED, Ct abd was notable for LUQ mass worrisome for malignancy, also ? Appendicitis. Surgery consulted and did not think appendicitis was the issue here rather recommended GI consultation for consideration of colonoscopy for tissue biopsy.  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 Pancreatic vs colon - GI consulted for assistance - colonoscopy done 1/16 and notable for malignant near obstructing tumor in the descending colon and at the splenic flexure, biopsied  - pathology report confirms invasive adenocarcinoma  - CT chest for further staging requested and noted subcentimeter mediastinal and right hilar lymph nodes but no specific nodules - MRI abd findings most consistent with locally advanced and metastatic colon carcinoma, mass centered in the splenic flexure colon with multiple abdominal masses and necrotic nodes, no evidence of hepatic metastasis - spoke with Dr. Alen Blew, pt will be set up for oncology follow up and will notify pt of appointment time and date once this has been arranged     Acute kidney injury - Cr up a bit and suspect from poor oral intake - will see with surgery if diet can be advanced - BMP in AM    Fever 1/15 - with tachycardia and WBC > 12 - pt meets Sepsis criteria but source remains unclear, could be related to malignancy  - empiric ABX initiated, vancomycin stopped 1/17 as no MRSA noted on any cultures - WBC is now WNL and no further fevers  -  stop Zosyn today and monitor  - lactic acid initially elevated but now cleared     Microcytic anemia, IDA - worrisome for malignant source - anemia panel with Fe 6 - transfused one unit PRBC 1/17 as Hg down to 7.6  And now Hg up to 8.1 this AM  - CBC in AM    Hypokalemia - supplemented and WNL this AM    FFT, severe PCM - nutritionist consulted   DVT prophylaxis: SCD's  Code Status: Full  Family Communication: Patient at bedside, sister over the phone  Disposition Plan: To be determined, once pt eating better and Cr stabilized   Consultants:   Surgery  GI  Oncology over the phone   Procedures:   None  Antimicrobials:   Vancomycin 1/15 --> 1/18  Zosyn 1/15 --> 1/19  Subjective: No events overnight.   Objective: Vitals:   01/14/17 0548 01/14/17 1400 01/14/17 2100 01/15/17 0614  BP: 102/64 105/71 105/69 118/78  Pulse: 91 96 84 78  Resp: 18 18 20 19   Temp: 98.9 F (37.2 C) 98.5 F (36.9 C) 98.8 F (37.1 C) 98.1 F (36.7 C)  TempSrc: Oral Oral Oral Oral  SpO2: 100% 96% 97% 100%  Weight:      Height:        Intake/Output Summary (Last 24 hours) at 01/15/17 0959 Last data filed at 01/15/17 0330  Gross per 24 hour  Intake             1655 ml  Output  350 ml  Net             1305 ml   Filed Weights   01/10/17 1539 01/11/17 0258  Weight: 62.4 kg (137 lb 8 oz) 63.9 kg (140 lb 12.8 oz)    Examination:  General exam: Appears calm and comfortable  Respiratory system: Respiratory effort normal. Cardiovascular system: S1 & S2 heard, RRR. No JVD, murmurs, rubs, gallops or clicks. No pedal edema. Gastrointestinal system: Abdomen is soft and tender in upper abd quadrants.  Central nervous system: Alert and oriented. No focal neurological deficits.  Data Reviewed: I have personally reviewed following labs and imaging studies  CBC:  Recent Labs Lab 01/11/17 0524 01/12/17 0518 01/13/17 0437 01/14/17 0543 01/15/17 0535  WBC 12.6* 13.8*  12.2* 10.3 7.2  HGB 8.2* 8.0* 7.6* 8.0* 8.1*  HCT 25.7* 26.2* 24.4* 25.6* 26.2*  MCV 69.6* 69.5* 69.3* 71.1* 71.4*  PLT 236 245 250 257 0000000   Basic Metabolic Panel:  Recent Labs Lab 01/10/17 1539 01/12/17 0518 01/13/17 0437 01/14/17 0543 01/15/17 0535  NA 133* 130* 133* 134* 136  K 3.9 3.3* 3.2* 3.5 3.8  CL 102 98* 101 104 107  CO2 23 23 22 22 23   GLUCOSE 162* 120* 96 106* 114*  BUN 10 6 5* 7 5*  CREATININE 0.95 0.92 0.86 1.48* 1.55*  CALCIUM 9.0 8.6* 8.5* 8.6* 8.8*   Liver Function Tests:  Recent Labs Lab 01/10/17 1539  AST 21  ALT 7*  ALKPHOS 70  BILITOT 0.8  PROT 8.3*  ALBUMIN 2.8*    Recent Labs Lab 01/10/17 1539  LIPASE 16   Urine analysis:    Component Value Date/Time   COLORURINE AMBER (A) 01/10/2017 1546   APPEARANCEUR HAZY (A) 01/10/2017 1546   LABSPEC 1.030 01/10/2017 1546   PHURINE 5.0 01/10/2017 1546   GLUCOSEU NEGATIVE 01/10/2017 1546   HGBUR NEGATIVE 01/10/2017 1546   BILIRUBINUR SMALL (A) 01/10/2017 1546   KETONESUR NEGATIVE 01/10/2017 1546   PROTEINUR 100 (A) 01/10/2017 1546   NITRITE NEGATIVE 01/10/2017 1546   LEUKOCYTESUR NEGATIVE 01/10/2017 1546   Radiology Studies: Ct Chest W Contrast Result Date: 01/11/2017 No evidence for pulmonary nodules. 2. Subcentimeter mediastinal and right hilar lymph nodes. Mildly prominent right cardio phrenic lymph node. 3. Partially visualized heterogenous mass at the splenic hilum/tail of the pancreas.   Ct Abdomen Pelvis W Contrast Result Date: 01/10/2017 Large heterogeneous mass in the left upper quadrant core poor aches the splenic flexure/ proximal descending colon , pancreatic tail common splenic hilum. Epicenter of the lesion appears to be colon tracking cranially and medially into the pancreatic tail and splenic hilum. Pancreatic tail adenocarcinoma would be another consideration. Diffuse mesenteric edema/congestion. Tiny enhancing lesion identified in the subcapsular right liver. Metastatic disease  would be a consideration. Mild hepatoduodenal ligament lymphadenopathy. Appendix dilated up to 10 mm diameter with an appendicolith identified in the base of the appendix. No substantial periappendiceal edema or inflammation is evident, but there is fairly diffuse mesenteric congestion/edema as noted above which hinders assessment.    Scheduled Meds: . feeding supplement (ENSURE ENLIVE)  237 mL Oral BID BM  . ferrous sulfate  325 mg Oral BID WC  . piperacillin-tazobactam (ZOSYN)  IV  3.375 g Intravenous Q8H   Continuous Infusions: . sodium chloride 75 mL/hr at 01/14/17 1228     LOS: 4 days   Time spent: 20 minutes   Faye Ramsay, MD [facility] Pager [phone]  If 7PM-7AM, please contact night-coverage [email or web address] Password [email or web address]  01/15/2017, 9:59 AM

## 2017-01-15 NOTE — Evaluation (Signed)
Physical Therapy Evaluation Patient Details Name: Jeff [name] MRN: [id number] [name]: [birth year] Today's Date: 01/15/2017   History of Present Illness  Patient is a 56 y/o male admitted with abdominal pain and weithg loss with poor oral intake.  Colonoscopy done 1/16 and notable for malignant near obstructing tumor in the descending colon and at splenic flexure, pancreatic tail, splenic hilum; Pancreatic vs colon, also found 8 mm right liver lesion.  Clinical Impression  Patient presents close to functional baseline.  Encouraged hallway ambulation during remainder of hospitalization.  No further skilled PT needs at this time.     Follow Up Recommendations No PT follow up    Equipment Recommendations  None recommended by PT    Recommendations for Other Services       Precautions / Restrictions Precautions Precautions: None      Mobility  Bed Mobility Overal bed mobility: Independent                Transfers Overall transfer level: Independent                  Ambulation/Gait Ambulation/Gait assistance: Independent Ambulation Distance (Feet): 400 Feet Assistive device: None Gait Pattern/deviations: Step-through pattern;WFL(Within Functional Limits)        Stairs            Wheelchair Mobility    Modified Rankin (Stroke Patients Only)       Balance Overall balance assessment: No apparent balance deficits (not formally assessed)                                           Pertinent Vitals/Pain Pain Assessment: 0-10 Pain Score: 5  Pain Location: abdomen Pain Descriptors / Indicators: Aching Pain Intervention(s): Monitored during session    Home Living Family/patient expects to be discharged to:: Private residence Living Arrangements: Alone   Type of Home: Apartment Home Access: Stairs to enter Entrance Stairs-Rails: Right Entrance Stairs-Number of Steps: 4 Home Layout: One level Home Equipment: Grab bars -  tub/shower      Prior Function Level of Independence: Independent         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   Dominant Hand: Right    Extremity/Trunk Assessment        Lower Extremity Assessment Lower Extremity Assessment: Overall WFL for tasks assessed       Communication   Communication: No difficulties  Cognition Arousal/Alertness: Awake/alert Behavior During Therapy: WFL for tasks assessed/performed Overall Cognitive Status: Within Functional Limits for tasks assessed                      General Comments      Exercises     Assessment/Plan    PT Assessment Patent does not need any further PT services  PT Problem List            PT Treatment Interventions      PT Goals (Current goals can be found in the Care Plan section)  Acute Rehab PT Goals PT Goal Formulation: All assessment and education complete, DC therapy    Frequency     Barriers to discharge        Co-evaluation               End of Session   Activity Tolerance: Patient tolerated treatment well Patient left: in bed;with call bell/phone  within reach           Time: 1158-1213 PT Time Calculation (min) (ACUTE ONLY): 15 min   Charges:   PT Evaluation $PT Eval Low Complexity: 1 Procedure     PT G CodesReginia Naas 2017-02-09, 12:19 PM  Magda Kiel, [place] 2017-02-09

## 2017-01-15 NOTE — Progress Notes (Signed)
[facility] Progress Note  3 Days Post-Op  Subjective: Pt states minimal abdominal pain at rest. No nausea or vomiting. No BM today. Had a BM yesterday.   Objective: Vital signs in last 24 hours: Temp:  [98.1 F (36.7 C)-98.8 F (37.1 C)] 98.1 F (36.7 C) (01/19 0614) Pulse Rate:  [78-96] 78 (01/19 0614) Resp:  [18-20] 19 (01/19 0614) BP: (105-118)/(69-78) 118/78 (01/19 0614) SpO2:  [96 %-100 %] 100 % (01/19 0614) Last BM Date: 01/14/17  Intake/Output from previous day: 01/18 0701 - 01/19 0700 In: [id number] [P.O.:480; I.V.:1025; IV Piggyback:150] Out: 350 [Urine:350] Intake/Output this shift: No intake/output data recorded.  PE: Gen: Alert, NAD, pleasant, cooperative, lying in bed Card: RRR, no M/G/R heard Pulm: Rate and effort normal Abd: Soft, firm mass noted in the LUQ, +BS, mild generalized TTP with greater TTP in the LUQ  Lab Results:   Recent Labs  01/14/17 0543 01/15/17 0535  WBC 10.3 7.2  HGB 8.0* 8.1*  HCT 25.6* 26.2*  PLT 257 249   BMET  Recent Labs  01/14/17 0543 01/15/17 0535  NA 134* 136  K 3.5 3.8  CL 104 107  CO2 22 23  GLUCOSE 106* 114*  BUN 7 5*  CREATININE 1.48* 1.55*  CALCIUM 8.6* 8.8*   PT/INR No results for input(s): LABPROT, INR in the last 72 hours. CMP     Component Value Date/Time   NA 136 01/15/2017 0535   K 3.8 01/15/2017 0535   CL 107 01/15/2017 0535   CO2 23 01/15/2017 0535   GLUCOSE 114 (H) 01/15/2017 0535   BUN 5 (L) 01/15/2017 0535   CREATININE 1.55 (H) 01/15/2017 0535   CALCIUM 8.8 (L) 01/15/2017 0535   PROT 8.3 (H) 01/10/2017 1539   ALBUMIN 2.8 (L) 01/10/2017 1539   AST 21 01/10/2017 1539   ALT 7 (L) 01/10/2017 1539   ALKPHOS 70 01/10/2017 1539   BILITOT 0.8 01/10/2017 1539   GFRNONAA 48 (L) 01/15/2017 0535   GFRAA 56 (L) 01/15/2017 0535   Lipase     Component Value Date/Time   LIPASE 16 01/10/2017 1539       Studies/Results: Mr Abdomen W Wo Contrast  Result Date:  01/15/2017 CLINICAL DATA:  Left upper quadrant mass on CT. subcentimeter enhancing liver lesion. EXAM: MRI ABDOMEN WITHOUT AND WITH CONTRAST TECHNIQUE: Multiplanar multisequence MR imaging of the abdomen was performed both before and after the administration of intravenous contrast. CONTRAST:  39mL MULTIHANCE GADOBENATE DIMEGLUMINE 529 MG/ML IV SOLN COMPARISON:  CT of 01/10/2017. Colonoscopy report of 01/12/2017 also reviewed. This describes a malignant appearing tumor at the splenic flexure of the colon. FINDINGS: Lower chest: Normal heart size without pericardial or pleural effusion. Hepatobiliary: Mild hepatomegaly at 17.9 cm craniocaudal. Corresponding the CT abnormality, within the subcapsular portion of segment 7, is a 7 mm T2 hyperintense lesion which demonstrates postcontrast imaging characteristics consistent with a hemangioma. Example image 10/series 3 and image 27/ series 11003. No suspicious liver lesion. Normal gallbladder, without biliary ductal dilatation. Pancreas: Normal appearance of the pancreatic head, body. Pancreatic tail is involved with the left upper quadrant dominant mass detailed below. Spleen: Medial spleen is also involved with the left upper quadrant mass detailed below. Adrenals/Urinary Tract: Normal adrenal glands. Normal kidneys, without hydronephrosis. Stomach/Bowel: Normal stomach, without wall thickening. Normal small bowel caliber. The proximal portion of the splenic flexure colonic mass is identified on image 39/series 4 and image 77/series 12. left upper quadrant masses which are likely a combination of direct  tumor extension and regional peritoneal metastasis. The dominant mass is positioned within the pancreatic tail and splenic hilum. This measures 8.5 x 5.7 cm on image 37/series 11002. 8.1 cm craniocaudal on image 45/series 12. Just cephalad solid and cystic mass measures 6.8 x 6.0 cm on image 74/ series 11003. More medial 2.3 cm heterogeneous mass adjacent to the aortic  bifurcation may represent peritoneal or nodal metastasis. Example image 79/ series 11003. Vascular/Lymphatic: Normal caliber of the aorta and branch vessels. Probable splenic vein involvement with resolved gastroepiploic collateral formation. necrotic nodal mass in the gastrohepatic ligament at 2.8 cm on image 34/ series 11004. Other:  Small volume perisplenic ascites. Musculoskeletal: No acute osseous abnormality. IMPRESSION: 1. Findings most consistent with locally advanced and metastatic colon carcinoma. Mass centered in the splenic flexure colon with multiple abdominal masses and necrotic nodes. 2. The right hepatic lobe lesion is consistent with a hemangioma. No evidence of hepatic metastasis. 3. 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 Electronically Signed   By: Abigail Miyamoto M.D.   On: 01/15/2017 08:38    Anti-infectives: Anti-infectives    Start     Dose/Rate Route Frequency Ordered Stop   01/11/17 1800  piperacillin-tazobactam (ZOSYN) IVPB 3.375 g     3.375 g 12.5 mL/hr over 240 Minutes Intravenous Every 8 hours 01/11/17 0950     01/11/17 1100  piperacillin-tazobactam (ZOSYN) IVPB 3.375 g     3.375 g 100 mL/hr over 30 Minutes Intravenous  Once 01/11/17 0944 01/11/17 1153   01/11/17 1030  vancomycin (VANCOCIN) IVPB 1000 mg/200 mL premix  Status:  Discontinued     1,000 mg 200 mL/hr over 60 Minutes Intravenous  Once 01/11/17 0944 01/11/17 0950   01/11/17 1030  vancomycin (VANCOCIN) IVPB 750 mg/150 ml premix  Status:  Discontinued     750 mg 150 mL/hr over 60 Minutes Intravenous Every 12 hours 01/11/17 0950 01/13/17 1540       Assessment/Plan  Malignant neoplasm of abdomen ([place]) - Left upper quadrant abdominal mass involving splenic flexure, pancreatic tail, splenic hilum; Pancreatic vs colon - 8 mm right liver lesion - MR abd revealed hemangioma of liver, No evidence of hepatic metastasis  - colonoscopy concerning for malignancy. Partially  obstructing mass in the descending colon and at the splenic flexure. - pathology revealed INVASIVE ADENOCARCINOMA - Oncology, Dr. Mckinley Jewel recommends waiting for pathology before official consult but will likely be followed outpt - CEA elevated  Fever - resolved Microcytic anemia - malignant source, was transfused 1/17   FEN: advance to reg diet ID: Zosyn 1/15>>Vanc 1/15>>1/17  Pt is having BM's and there is no radiological evidence of obstruction. No plan for operation during this hospitalization in absence of obstructive symptoms. Pathology showed invasive adenocarcinoma. Pt will f/u with Dr. Barry Dienes within 1-2 weeks. Consulted case management. Spoke with Daughter today who states the pt lives alone and does not have health insurance. Daughter is concerned about pt's ability to f/u.  We will continue to follow this patient.   LOS: 4 days    Kalman Drape , [facility] Surgery 01/15/2017, 8:57 AM Pager: [phone] Consults: [phone] Mon-Fri 7:00 am-4:30 pm Sat-Sun 7:00 am-11:30 am

## 2017-01-15 NOTE — Progress Notes (Signed)
Pt left floor for MRI

## 2017-01-15 NOTE — Care Management Note (Addendum)
Case Management Note  Patient Details  Name: Jeff [name] MRN: [id number] Date of Birth: June 11, 1961  Subjective/Objective:     Malignant neoplasm of abdomen, AKI          Action/Plan: Discharge Planning: NCM spoke to pt and lives at home alone. Gave permission to speak to dtr or sister. Appt scheduled at [facility] on 01/28/2017 at 1030 am. Will provide pt with MATCH to assist with meds. Waiting for final recommendations for home. Pt states he has orange card in the past. Provided brochure on [facility].  Pt will follow up with applying for orange card again at his appt on 01/28/2017.   [name] [phone].  If unavailable,please call sister: [name] [phone] Expected Discharge Date:                Expected Discharge Plan:  Home/Self Care  In-House Referral:  NA  Discharge planning Services  CM Consult  Post Acute Care Choice:  NA Choice offered to:  NA  DME Arranged:  N/A DME Agency:  NA  HH Arranged:  NA HH Agency:  NA  Status of Service:  Completed, signed off  If discussed at [place] of Stay Meetings, dates discussed:    Additional Comments:  Erenest Rasher, RN 01/15/2017, 12:35 PM

## 2017-01-16 LAB — BASIC METABOLIC PANEL
Anion gap: 8 (ref 5–15)
BUN: 5 mg/dL — AB (ref 6–20)
CALCIUM: 8.8 mg/dL — AB (ref 8.9–10.3)
CHLORIDE: 107 mmol/L (ref 101–111)
CO2: 22 mmol/L (ref 22–32)
CREATININE: 1.23 mg/dL (ref 0.61–1.24)
GFR calc Af Amer: >60 mL/min (ref >60)
GFR calc non Af Amer: >60 mL/min (ref >60)
GLUCOSE: 109 mg/dL — AB (ref 65–99)
Potassium: 3.6 mmol/L (ref 3.5–5.1)
Sodium: 137 mmol/L (ref 135–145)

## 2017-01-16 LAB — CBC
HEMATOCRIT: 26.7 % — AB (ref 39.0–52.0)
HEMOGLOBIN: 8.3 g/dL — AB (ref 13.0–17.0)
MCH: 22.1 pg — AB (ref 26.0–34.0)
MCHC: 31.1 g/dL (ref 30.0–36.0)
MCV: 71.2 fL — AB (ref 78.0–100.0)
Platelets: 273 10*3/uL (ref 150–400)
RBC: 3.75 MIL/uL — ABNORMAL LOW (ref 4.22–5.81)
RDW: 18.1 % — AB (ref 11.5–15.5)
WBC: 8.2 10*3/uL (ref 4.0–10.5)

## 2017-01-16 NOTE — Progress Notes (Signed)
Patient ID: Jeff [name], male   [name]: Jul 03, 1961, 56 y.o.   MRN: [id number]    PROGRESS NOTE    Jeff [name]  [name] [name]: 1961-07-19 [name]: 01/10/2017  PCP: [name] Patient   Brief Narrative:  Pt admitted after midnight, please see detailed admission ont by Dr. Alcario Drought. In brief, pt presented with several weeks duration of progressive LUQ pain associated with poor oral intake and weight loss. In ED, Ct abd was notable for LUQ mass worrisome for malignancy, also ? Appendicitis. Surgery consulted and did not think appendicitis was the issue here rather recommended GI consultation for consideration of colonoscopy for tissue biopsy.  Assessment & Plan:   Principal Problem:   Malignant neoplasm of abdomen ([place]) Active Problems:   Protein-calorie malnutrition, severe - LUQ area, ? Pancreatic vs colon - GI consulted for assistance - colonoscopy done 1/16 and notable for malignant near obstructing tumor in the descending colon and at the splenic flexure, biopsied  - pathology report confirms invasive adenocarcinoma  - CT chest for further staging requested and noted subcentimeter mediastinal and right hilar lymph nodes but no specific nodules - MRI abd findings most consistent with locally advanced and metastatic colon carcinoma, mass centered in the splenic flexure colon with multiple abdominal masses and necrotic nodes, no evidence of hepatic metastasis - spoke with Dr. Alen Blew, pt will be set up for oncology follow up and will notify pt of appointment time and date once this has been arranged  - pt also to follow up with surgeon Dr. Barry Dienes for further management     Acute kidney injury - Cr now down after diet advanced  - BMP in AM    Fever 1/15 - with tachycardia and WBC > 12 - pt meets Sepsis criteria but source remains unclear, could be related to malignancy  - empiric ABX initiated, vancomycin stopped 1/17 as no MRSA noted on any cultures - WBC is now WNL and no further  fevers  - has been off ABX     Microcytic anemia, IDA - worrisome for malignant source - anemia panel with Fe 6 - transfused one unit PRBC 1/17 as Hg down to 7.6  And now Hg up to 8.3 this AM  - CBC in AM    Hypokalemia - supplemented and WNL this AM    FFT, severe PCM - nutritionist consulted   DVT prophylaxis: SCD's  Code Status: Full  Family Communication: Patient at bedside, sister over the phone  Disposition Plan: likely in AM  Consultants:   Surgery  GI  Oncology over the phone   Procedures:   None  Antimicrobials:   Vancomycin 1/15 --> 1/18  Zosyn 1/15 --> 1/19  Subjective: No events overnight.   Objective: Vitals:   01/15/17 0614 01/15/17 1251 01/15/17 2200 01/16/17 0541  BP: 118/78 101/76 118/77 110/73  Pulse: 78 84 76 81  Resp: 19 19 18 18   Temp: 98.1 F (36.7 C) 98.1 F (36.7 C) 98.2 F (36.8 C) 98.5 F (36.9 C)  TempSrc: Oral Oral Oral Oral  SpO2: 100% 100% 100% 100%  Weight:      Height:        Intake/Output Summary (Last 24 hours) at 01/16/17 1108 Last data filed at 01/16/17 0535  Gross per 24 hour  Intake              720 ml  Output                0  ml  Net              720 ml   Filed Weights   01/10/17 1539 01/11/17 0258  Weight: 62.4 kg (137 lb 8 oz) 63.9 kg (140 lb 12.8 oz)    Examination:  General exam: Appears calm and comfortable  Respiratory system: Respiratory effort normal. Cardiovascular system: S1 & S2 heard, RRR. No JVD, murmurs, rubs, gallops or clicks. No pedal edema. Gastrointestinal system: Abdomen is soft and tender in upper abd quadrants.  Central nervous system: Alert and oriented. No focal neurological deficits.  Data Reviewed: I have personally reviewed following labs and imaging studies  CBC:  Recent Labs Lab 01/12/17 0518 01/13/17 0437 01/14/17 0543 01/15/17 0535 01/16/17 0541  WBC 13.8* 12.2* 10.3 7.2 8.2  HGB 8.0* 7.6* 8.0* 8.1* 8.3*  HCT 26.2* 24.4* 25.6* 26.2* 26.7*  MCV 69.5* 69.3*  71.1* 71.4* 71.2*  PLT 245 250 257 249 [id number]   Basic Metabolic Panel:  Recent Labs Lab 01/12/17 0518 01/13/17 0437 01/14/17 0543 01/15/17 0535 01/16/17 0541  NA 130* 133* 134* 136 137  K 3.3* 3.2* 3.5 3.8 3.6  CL 98* 101 104 107 107  CO2 23 22 22 23 22   GLUCOSE 120* 96 106* 114* 109*  BUN 6 5* 7 5* 5*  CREATININE 0.92 0.86 1.48* 1.55* 1.23  CALCIUM 8.6* 8.5* 8.6* 8.8* 8.8*   Liver Function Tests:  Recent Labs Lab 01/10/17 1539  AST 21  ALT 7*  ALKPHOS 70  BILITOT 0.8  PROT 8.3*  ALBUMIN 2.8*    Recent Labs Lab 01/10/17 1539  LIPASE 16   Urine analysis:    Component Value Date/Time   COLORURINE AMBER (A) 01/10/2017 1546   APPEARANCEUR HAZY (A) 01/10/2017 1546   LABSPEC 1.030 01/10/2017 1546   PHURINE 5.0 01/10/2017 1546   GLUCOSEU NEGATIVE 01/10/2017 1546   HGBUR NEGATIVE 01/10/2017 1546   BILIRUBINUR SMALL (A) 01/10/2017 1546   KETONESUR NEGATIVE 01/10/2017 1546   PROTEINUR 100 (A) 01/10/2017 1546   NITRITE NEGATIVE 01/10/2017 1546   LEUKOCYTESUR NEGATIVE 01/10/2017 1546   Radiology Studies: Ct Chest W Contrast Result Date: 01/11/2017 No evidence for pulmonary nodules. 2. Subcentimeter mediastinal and right hilar lymph nodes. Mildly prominent right cardio phrenic lymph node. 3. Partially visualized heterogenous mass at the splenic hilum/tail of the pancreas.   Ct Abdomen Pelvis W Contrast Result Date: 01/10/2017 Large heterogeneous mass in the left upper quadrant core poor aches the splenic flexure/ proximal descending colon , pancreatic tail common splenic hilum. Epicenter of the lesion appears to be colon tracking cranially and medially into the pancreatic tail and splenic hilum. Pancreatic tail adenocarcinoma would be another consideration. Diffuse mesenteric edema/congestion. Tiny enhancing lesion identified in the subcapsular right liver. Metastatic disease would be a consideration. Mild hepatoduodenal ligament lymphadenopathy. Appendix dilated up to 10  mm diameter with an appendicolith identified in the base of the appendix. No substantial periappendiceal edema or inflammation is evident, but there is fairly diffuse mesenteric congestion/edema as noted above which hinders assessment.    Scheduled Meds: . feeding supplement (ENSURE ENLIVE)  237 mL Oral BID BM  . ferrous sulfate  325 mg Oral BID WC   Continuous Infusions:    LOS: 5 days   Time spent: 20 minutes   Faye Ramsay, MD [facility] Pager [phone]  If 7PM-7AM, please contact night-coverage [email or web address] Password [email or web address] 01/16/2017, 11:08 AM

## 2017-01-16 NOTE — Progress Notes (Signed)
4 Days Post-Op  Subjective: No complaints. Tolerating diet and passing flatus  Objective: Vital signs in last 24 hours: Temp:  [98.1 F (36.7 C)-98.5 F (36.9 C)] 98.5 F (36.9 C) (01/20 0541) Pulse Rate:  [76-84] 81 (01/20 0541) Resp:  [18-19] 18 (01/20 0541) BP: (101-118)/(73-77) 110/73 (01/20 0541) SpO2:  [100 %] 100 % (01/20 0541) Last BM Date: 01/15/17  Intake/Output from previous day: 01/19 0701 - 01/20 0700 In: 720 [P.O.:720] Out: 0  Intake/Output this shift: Total I/O In: 720 [P.O.:720] Out: 0   Resp: clear to auscultation bilaterally Cardio: regular rate and rhythm GI: soft, nontender. mild distension  Lab Results:   Recent Labs  01/15/17 0535 01/16/17 0541  WBC 7.2 8.2  HGB 8.1* 8.3*  HCT 26.2* 26.7*  PLT 249 273   BMET  Recent Labs  01/14/17 0543 01/15/17 0535  NA 134* 136  K 3.5 3.8  CL 104 107  CO2 22 23  GLUCOSE 106* 114*  BUN 7 5*  CREATININE 1.48* 1.55*  CALCIUM 8.6* 8.8*   PT/INR No results for input(s): LABPROT, INR in the last 72 hours. ABG No results for input(s): PHART, HCO3 in the last 72 hours.  Invalid input(s): PCO2, PO2  Studies/Results: Mr Abdomen W Wo Contrast  Result Date: 01/15/2017 CLINICAL DATA:  Left upper quadrant mass on CT. subcentimeter enhancing liver lesion. EXAM: MRI ABDOMEN WITHOUT AND WITH CONTRAST TECHNIQUE: Multiplanar multisequence MR imaging of the abdomen was performed both before and after the administration of intravenous contrast. CONTRAST:  47mL MULTIHANCE GADOBENATE DIMEGLUMINE 529 MG/ML IV SOLN COMPARISON:  CT of 01/10/2017. Colonoscopy report of 01/12/2017 also reviewed. This describes a malignant appearing tumor at the splenic flexure of the colon. FINDINGS: Lower chest: Normal heart size without pericardial or pleural effusion. Hepatobiliary: Mild hepatomegaly at 17.9 cm craniocaudal. Corresponding the CT abnormality, within the subcapsular portion of segment 7, is a 7 mm T2 hyperintense lesion  which demonstrates postcontrast imaging characteristics consistent with a hemangioma. Example image 10/series 3 and image 27/ series 11003. No suspicious liver lesion. Normal gallbladder, without biliary ductal dilatation. Pancreas: Normal appearance of the pancreatic head, body. Pancreatic tail is involved with the left upper quadrant dominant mass detailed below. Spleen: Medial spleen is also involved with the left upper quadrant mass detailed below. Adrenals/Urinary Tract: Normal adrenal glands. Normal kidneys, without hydronephrosis. Stomach/Bowel: Normal stomach, without wall thickening. Normal small bowel caliber. The proximal portion of the splenic flexure colonic mass is identified on image 39/series 4 and image 77/series 12. left upper quadrant masses which are likely a combination of direct tumor extension and regional peritoneal metastasis. The dominant mass is positioned within the pancreatic tail and splenic hilum. This measures 8.5 x 5.7 cm on image 37/series 11002. 8.1 cm craniocaudal on image 45/series 12. Just cephalad solid and cystic mass measures 6.8 x 6.0 cm on image 74/ series 11003. More medial 2.3 cm heterogeneous mass adjacent to the aortic bifurcation may represent peritoneal or nodal metastasis. Example image 79/ series 11003. Vascular/Lymphatic: Normal caliber of the aorta and branch vessels. Probable splenic vein involvement with resolved gastroepiploic collateral formation. necrotic nodal mass in the gastrohepatic ligament at 2.8 cm on image 34/ series 11004. Other:  Small volume perisplenic ascites. Musculoskeletal: No acute osseous abnormality. IMPRESSION: 1. Findings most consistent with locally advanced and metastatic colon carcinoma. Mass centered in the splenic flexure colon with multiple abdominal masses and necrotic nodes. 2. The right hepatic lobe lesion is consistent with a hemangioma. No evidence of  hepatic metastasis. 3. 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 Electronically Signed   By: Abigail Miyamoto M.D.   On: 01/15/2017 08:38    Anti-infectives: Anti-infectives    Start     Dose/Rate Route Frequency Ordered Stop   01/11/17 1800  piperacillin-tazobactam (ZOSYN) IVPB 3.375 g  Status:  Discontinued     3.375 g 12.5 mL/hr over 240 Minutes Intravenous Every 8 hours 01/11/17 0950 01/15/17 1009   01/11/17 1100  piperacillin-tazobactam (ZOSYN) IVPB 3.375 g     3.375 g 100 mL/hr over 30 Minutes Intravenous  Once 01/11/17 0944 01/11/17 1153   01/11/17 1030  vancomycin (VANCOCIN) IVPB 1000 mg/200 mL premix  Status:  Discontinued     1,000 mg 200 mL/hr over 60 Minutes Intravenous  Once 01/11/17 0944 01/11/17 0950   01/11/17 1030  vancomycin (VANCOCIN) IVPB 750 mg/150 ml premix  Status:  Discontinued     750 mg 150 mL/hr over 60 Minutes Intravenous Every 12 hours 01/11/17 0950 01/13/17 1540      Assessment/Plan: s/p Procedure(s): COLONOSCOPY (Left) plan for him to follow up with Dr. Barry Dienes as outpt for resection of colon mass involving spleen and pancreas  LOS: 5 days    TOTH III,Amina Menchaca S 01/16/2017

## 2017-01-17 LAB — BASIC METABOLIC PANEL
ANION GAP: 7 (ref 5–15)
BUN: 5 mg/dL — ABNORMAL LOW (ref 6–20)
CO2: 23 mmol/L (ref 22–32)
Calcium: 8.7 mg/dL — ABNORMAL LOW (ref 8.9–10.3)
Chloride: 105 mmol/L (ref 101–111)
Creatinine, Ser: 1.17 mg/dL (ref 0.61–1.24)
GFR calc Af Amer: >60 mL/min (ref >60)
Glucose, Bld: 100 mg/dL — ABNORMAL HIGH (ref 65–99)
POTASSIUM: 3.6 mmol/L (ref 3.5–5.1)
SODIUM: 135 mmol/L (ref 135–145)

## 2017-01-17 LAB — CBC
HCT: 25.4 % — ABNORMAL LOW (ref 39.0–52.0)
Hemoglobin: 8 g/dL — ABNORMAL LOW (ref 13.0–17.0)
MCH: 22.5 pg — ABNORMAL LOW (ref 26.0–34.0)
MCHC: 31.5 g/dL (ref 30.0–36.0)
MCV: 71.3 fL — AB (ref 78.0–100.0)
PLATELETS: 309 10*3/uL (ref 150–400)
RBC: 3.56 MIL/uL — AB (ref 4.22–5.81)
RDW: 18.2 % — ABNORMAL HIGH (ref 11.5–15.5)
WBC: 8.1 10*3/uL (ref 4.0–10.5)

## 2017-01-17 NOTE — Progress Notes (Signed)
Patient ID: Jeff [name], male   [name]: [birth year], 56 y.o.   MRN: [id number]    PROGRESS NOTE    Jeff [name]  [name] [name]: 05/20/1961 [name]: 01/10/2017  PCP: [name] Patient   Brief Narrative:  Pt admitted after midnight, please see detailed admission ont by Dr. Alcario Drought. In brief, pt presented with several weeks duration of progressive LUQ pain associated with poor oral intake and weight loss. In ED, Ct abd was notable for LUQ mass worrisome for malignancy, also ? Appendicitis. Surgery consulted and did not think appendicitis was the issue here rather recommended GI consultation for consideration of colonoscopy for tissue biopsy.  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 Pancreatic vs colon - GI consulted for assistance - colonoscopy done 1/16 and notable for malignant near obstructing tumor in the descending colon and at the splenic flexure - pathology report confirms invasive adenocarcinoma  - CT chest noted subcentimeter mediastinal and right hilar lymph nodes but no specific nodules - MRI abd findings most consistent with locally advanced and metastatic colon carcinoma, mass centered in the splenic flexure colon with multiple abdominal masses and necrotic nodes, no evidence of hepatic metastasis - spoke with Dr. Alen Blew, pt will be set up for oncology follow up and will notify pt of appointment time and date once this has been arranged  - pt also to follow up with surgeon Dr. Barry Dienes for further management     Acute kidney injury - Cr now down after diet advanced but pt says he feels more sick this AM and worse appetite  - BMP in AM    Fever 1/15 - with tachycardia and WBC > 12 - pt meets Sepsis criteria but source remains unclear, could be related to malignancy  - empiric ABX initiated, vancomycin stopped 1/17 as no MRSA noted on any cultures - WBC is now WNL  and no further fevers  - has been off ABX     Microcytic anemia, IDA - worrisome for malignant source - anemia panel with Fe 6 - transfused one unit PRBC 1/17 as Hg down to 7.6  And now Hg up to 8.3 this AM  - CBC in AM    Hypokalemia - supplemented and WNL this AM    FFT, severe PCM - nutritionist consulted  - poor oral intake - encouraged intake and will see how pt does in next 24 hours   DVT prophylaxis: SCD's  Code Status: Full  Family Communication: Patient at bedside, sister over the phone  Disposition Plan: likely in AM if pt feels better and eating better   Consultants:   Surgery  GI  Oncology over the phone   Procedures:   None  Antimicrobials:   Vancomycin 1/15 --> 1/18  Zosyn 1/15 --> 1/19  Subjective: No events overnight.   Objective: Vitals:   01/16/17 0541 01/16/17 1330 01/16/17 2154 01/17/17 0528  BP: 110/73 133/88 111/76 115/70  Pulse: 81 77 69 87  Resp: 18 18 18 17   Temp: 98.5 F (36.9 C) 98.5 F (36.9 C) 98.2 F (36.8 C) 98 F (36.7 C)  TempSrc: Oral Oral Oral Oral  SpO2: 100% 100% 100% 100%  Weight:      Height:        Intake/Output Summary (Last 24 hours) at 01/17/17 1257 Last data filed at 01/17/17 0842  Gross per 24 hour  Intake  360 ml  Output              400 ml  Net              -40 ml   Filed Weights   01/10/17 1539 01/11/17 0258  Weight: 62.4 kg (137 lb 8 oz) 63.9 kg (140 lb 12.8 oz)    Examination:  General exam: Appears calm and comfortable  Respiratory system: Respiratory effort normal. Cardiovascular system: S1 & S2 heard, RRR. No JVD, murmurs, rubs, gallops or clicks. No pedal edema. Gastrointestinal system: Abdomen is soft and tender in upper abd quadrants.  Central nervous system: Alert and oriented. No focal neurological deficits.  Data Reviewed: I have personally reviewed following labs and imaging studies  CBC:  Recent Labs Lab 01/13/17 0437 01/14/17 0543 01/15/17 0535  01/16/17 0541 01/17/17 0430  WBC 12.2* 10.3 7.2 8.2 8.1  HGB 7.6* 8.0* 8.1* 8.3* 8.0*  HCT 24.4* 25.6* 26.2* 26.7* 25.4*  MCV 69.3* 71.1* 71.4* 71.2* 71.3*  PLT 250 257 249 273 [id number]   Basic Metabolic Panel:  Recent Labs Lab 01/13/17 0437 01/14/17 0543 01/15/17 0535 01/16/17 0541 01/17/17 0430  NA 133* 134* 136 137 135  K 3.2* 3.5 3.8 3.6 3.6  CL 101 104 107 107 105  CO2 22 22 23 22 23   GLUCOSE 96 106* 114* 109* 100*  BUN 5* 7 5* 5* 5*  CREATININE 0.86 1.48* 1.55* 1.23 1.17  CALCIUM 8.5* 8.6* 8.8* 8.8* 8.7*   Liver Function Tests:  Recent Labs Lab 01/10/17 1539  AST 21  ALT 7*  ALKPHOS 70  BILITOT 0.8  PROT 8.3*  ALBUMIN 2.8*    Recent Labs Lab 01/10/17 1539  LIPASE 16   Urine analysis:    Component Value Date/Time   COLORURINE AMBER (A) 01/10/2017 1546   APPEARANCEUR HAZY (A) 01/10/2017 1546   LABSPEC 1.030 01/10/2017 1546   PHURINE 5.0 01/10/2017 1546   GLUCOSEU NEGATIVE 01/10/2017 1546   HGBUR NEGATIVE 01/10/2017 1546   BILIRUBINUR SMALL (A) 01/10/2017 1546   KETONESUR NEGATIVE 01/10/2017 1546   PROTEINUR 100 (A) 01/10/2017 1546   NITRITE NEGATIVE 01/10/2017 1546   LEUKOCYTESUR NEGATIVE 01/10/2017 1546   Radiology Studies: Ct Chest W Contrast Result Date: 01/11/2017 No evidence for pulmonary nodules. 2. Subcentimeter mediastinal and right hilar lymph nodes. Mildly prominent right cardio phrenic lymph node. 3. Partially visualized heterogenous mass at the splenic hilum/tail of the pancreas.   Ct Abdomen Pelvis W Contrast Result Date: 01/10/2017 Large heterogeneous mass in the left upper quadrant core poor aches the splenic flexure/ proximal descending colon , pancreatic tail common splenic hilum. Epicenter of the lesion appears to be colon tracking cranially and medially into the pancreatic tail and splenic hilum. Pancreatic tail adenocarcinoma would be another consideration. Diffuse mesenteric edema/congestion. Tiny enhancing lesion identified in the  subcapsular right liver. Metastatic disease would be a consideration. Mild hepatoduodenal ligament lymphadenopathy. Appendix dilated up to 10 mm diameter with an appendicolith identified in the base of the appendix. No substantial periappendiceal edema or inflammation is evident, but there is fairly diffuse mesenteric congestion/edema as noted above which hinders assessment.    Scheduled Meds: . feeding supplement (ENSURE ENLIVE)  237 mL Oral BID BM  . ferrous sulfate  325 mg Oral BID WC   Continuous Infusions:    LOS: 6 days   Time spent: 20 minutes   Faye Ramsay, MD [facility] Pager [phone]  If 7PM-7AM, please contact night-coverage [email or web address] Password [facility] 01/17/2017, 12:57  PM    

## 2017-01-18 LAB — BASIC METABOLIC PANEL
Anion gap: 7 (ref 5–15)
BUN: <5 mg/dL — ABNORMAL LOW (ref 6–20)
CALCIUM: 8.7 mg/dL — AB (ref 8.9–10.3)
CO2: 24 mmol/L (ref 22–32)
Chloride: 105 mmol/L (ref 101–111)
Creatinine, Ser: 1.18 mg/dL (ref 0.61–1.24)
GFR calc Af Amer: >60 mL/min (ref >60)
GFR calc non Af Amer: >60 mL/min (ref >60)
Glucose, Bld: 103 mg/dL — ABNORMAL HIGH (ref 65–99)
Potassium: 3.6 mmol/L (ref 3.5–5.1)
SODIUM: 136 mmol/L (ref 135–145)

## 2017-01-18 LAB — CBC
HCT: 25.5 % — ABNORMAL LOW (ref 39.0–52.0)
Hemoglobin: 7.9 g/dL — ABNORMAL LOW (ref 13.0–17.0)
MCH: 22.1 pg — ABNORMAL LOW (ref 26.0–34.0)
MCHC: 31 g/dL (ref 30.0–36.0)
MCV: 71.2 fL — ABNORMAL LOW (ref 78.0–100.0)
Platelets: 296 10*3/uL (ref 150–400)
RBC: 3.58 MIL/uL — ABNORMAL LOW (ref 4.22–5.81)
RDW: 18.1 % — AB (ref 11.5–15.5)
WBC: 8.8 10*3/uL (ref 4.0–10.5)

## 2017-01-18 NOTE — Care Management Note (Signed)
Case Management Note  Patient Details  Name: ROSEMARY STAHLBERG MRN: [id number] Date of Birth: 11-04-1961  Subjective/Objective:                    Action/Plan:  Will continue to follow .   Patient can have prescriptions filled at [facility] . [facility] letter does not cover Ensure , over the counter medications or pain medications.  Expected Discharge Date:                  Expected Discharge Plan:  Home/Self Care  In-House Referral:  NA  Discharge planning Services  CM Consult  Post Acute Care Choice:  NA Choice offered to:  NA  DME Arranged:  N/A DME Agency:  NA  HH Arranged:  NA HH Agency:  NA  Status of Service:  Completed, signed off  If discussed at [place] of Stay Meetings, dates discussed:    Additional Comments:  Marilu Favre, RN 01/18/2017, 12:29 PM

## 2017-01-18 NOTE — Progress Notes (Signed)
[facility] Progress Note  6 Days Post-Op  Subjective: NAE. Denies abdominal pain, nausea, or vomiting. Tolerating PO and having bowel movements.  Objective: Vital signs in last 24 hours: Temp:  [98.1 F (36.7 C)-98.6 F (37 C)] 98.1 F (36.7 C) (01/22 0540) Pulse Rate:  [76-82] 77 (01/22 0540) Resp:  [17] 17 (01/22 0540) BP: (111-116)/(81-84) 116/83 (01/22 0540) SpO2:  [100 %] 100 % (01/22 0540) Last BM Date: 01/17/17  Intake/Output from previous day: 01/21 0701 - 01/22 0700 In: 600 [P.O.:600] Out: 750 [Urine:750] Intake/Output this shift: No intake/output data recorded.  PE: Gen:  Alert, NAD, pleasant Pulm:  Normal effort, CTAB Abd: Soft, non-tender, palpable mass LUQ, mild distention,+BS Ext:  No erythema, edema, or tenderness  Lab Results:   Recent Labs  01/17/17 0430 01/18/17 0705  WBC 8.1 8.8  HGB 8.0* 7.9*  HCT 25.4* 25.5*  PLT 309 296   BMET  Recent Labs  01/17/17 0430 01/18/17 0705  NA 135 136  K 3.6 3.6  CL 105 105  CO2 23 24  GLUCOSE 100* 103*  BUN 5* <5*  CREATININE 1.17 1.18  CALCIUM 8.7* 8.7*   PT/INR No results for input(s): LABPROT, INR in the last 72 hours. CMP     Component Value Date/Time   NA 136 01/18/2017 0705   K 3.6 01/18/2017 0705   CL 105 01/18/2017 0705   CO2 24 01/18/2017 0705   GLUCOSE 103 (H) 01/18/2017 0705   BUN <5 (L) 01/18/2017 0705   CREATININE 1.18 01/18/2017 0705   CALCIUM 8.7 (L) 01/18/2017 0705   PROT 8.3 (H) 01/10/2017 1539   ALBUMIN 2.8 (L) 01/10/2017 1539   AST 21 01/10/2017 1539   ALT 7 (L) 01/10/2017 1539   ALKPHOS 70 01/10/2017 1539   BILITOT 0.8 01/10/2017 1539   GFRNONAA >60 01/18/2017 0705   GFRAA >60 01/18/2017 0705   Lipase     Component Value Date/Time   LIPASE 16 01/10/2017 1539   Anti-infectives: Anti-infectives    Start     Dose/Rate Route Frequency Ordered Stop   01/11/17 1800  piperacillin-tazobactam (ZOSYN) IVPB 3.375 g  Status:  Discontinued     3.375 g 12.5  mL/hr over 240 Minutes Intravenous Every 8 hours 01/11/17 0950 01/15/17 1009   01/11/17 1100  piperacillin-tazobactam (ZOSYN) IVPB 3.375 g     3.375 g 100 mL/hr over 30 Minutes Intravenous  Once 01/11/17 0944 01/11/17 1153   01/11/17 1030  vancomycin (VANCOCIN) IVPB 1000 mg/200 mL premix  Status:  Discontinued     1,000 mg 200 mL/hr over 60 Minutes Intravenous  Once 01/11/17 0944 01/11/17 0950   01/11/17 1030  vancomycin (VANCOCIN) IVPB 750 mg/150 ml premix  Status:  Discontinued     750 mg 150 mL/hr over 60 Minutes Intravenous Every 12 hours 01/11/17 0950 01/13/17 1540     Assessment/Plan 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 Partially obstructing mass in the descending colon and at the splenic flexure. Pathology revealed INVASIVE ADENOCARCINOMA - CEA elevated (10.7), CA 19-9 and AFP-tumor marker were WNL - Outpatient oncology follow up with Dr. Alen Blew is being arranged  Plan:  No obstructive symptoms indicated urgent surgical intervention. Recommend outpatient follow up with Dr. Stark Klein for resection of colonic adenocarcinoma involving the spleen  and pancreas.   LOS: 7 days    Jill Alexanders , [address] [facility] 01/18/2017, 9:24 AM Pager: [phone] Consults: [phone] Mon-Fri 7:00 am-4:30 pm Sat-Sun 7:00 am-11:30 am

## 2017-01-18 NOTE — Progress Notes (Signed)
Patient ID: Jeff [name], male   [name]: [birth year], 56 y.o.   MRN: [id number]    PROGRESS NOTE    Jeff [name]  [id number] [name]: [birth year] [name]: 01/10/2017  PCP: [name] Patient   Brief Narrative:  Pt admitted after midnight, please see detailed admission ont by Dr. Alcario Drought. In brief, pt presented with several weeks duration of progressive LUQ pain associated with poor oral intake and weight loss. In ED, Ct abd was notable for LUQ mass worrisome for malignancy, also ? Appendicitis. Surgery consulted and did not think appendicitis was the issue here rather recommended GI consultation for consideration of colonoscopy for tissue biopsy.  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 Pancreatic vs colon - GI consulted for assistance - colonoscopy done 1/16 and notable for malignant near obstructing tumor in the descending colon and at the splenic flexure - pathology report confirms invasive adenocarcinoma  - CT chest noted subcentimeter mediastinal and right hilar lymph nodes but no specific nodules - MRI abd findings most consistent with locally advanced and metastatic colon carcinoma, mass centered in the splenic flexure colon with multiple abdominal masses and necrotic nodes, no evidence of hepatic metastasis - spoke with Dr. Alen Blew, pt will be set up for oncology follow up and will notify pt of appointment time and date once this has been arranged  - pt also to follow up with surgeon Dr. Barry Dienes for further management  - current problem is that pt apparently has no medical insurance and no place to go to, I have consulted with social work to see what can be done to help pt in this difficult situation, ? If pt can go to ALF     Acute kidney injury - Cr now down after diet advanced but pt says he feels more sick this AM and worse appetite  - BMP in AM    Fever 1/15 - with  tachycardia and WBC > 12 - pt meets Sepsis criteria but source remains unclear, could be related to malignancy  - empiric ABX initiated, vancomycin stopped 1/17 as no MRSA noted on any cultures - WBC is now WNL and no further fevers  - has been off ABX     Microcytic anemia, IDA - worrisome for malignant source - anemia panel with Fe 6 - transfused one unit PRBC 1/17 as Hg down to 7.6  And now Hg up to 7.9 this AM  - continue iron supplementation  - CBC in AM    Hypokalemia - supplemented and WNL this AM    FFT, severe PCM - nutritionist consulted  - poor oral intake - encouraged intake and will see how pt does in next 24 hours   DVT prophylaxis: SCD's  Code Status: Full  Family Communication: Patient at bedside, sister over the phone  Disposition Plan: pt apparently has no place to go to, SW consulted for assistance   Consultants:   Surgery  GI  Oncology over the phone   Procedures:   None  Antimicrobials:   Vancomycin 1/15 --> 1/18  Zosyn 1/15 --> 1/19  Subjective: No events overnight.   Objective: Vitals:   01/17/17 0528 01/17/17 1354 01/17/17 2109 01/18/17 0540  BP: 115/70 115/84 111/81 116/83  Pulse: 87 82 76 77  Resp: 17 17 17 17   Temp: 98 F (36.7 C) 98.5 F (36.9 C) 98.6 F (37 C) 98.1 F (36.7  C)  TempSrc: Oral Oral Oral Oral  SpO2: 100% 100% 100% 100%  Weight:      Height:        Intake/Output Summary (Last 24 hours) at 01/18/17 1228 Last data filed at 01/18/17 0600  Gross per 24 hour  Intake              600 ml  Output              350 ml  Net              250 ml   Filed Weights   01/10/17 1539 01/11/17 0258  Weight: 62.4 kg (137 lb 8 oz) 63.9 kg (140 lb 12.8 oz)    Examination:  General exam: Appears calm and comfortable  Respiratory system: Respiratory effort normal. Cardiovascular system: S1 & S2 heard, RRR. No JVD, murmurs, rubs, gallops or clicks. No pedal edema. Gastrointestinal system: Abdomen is soft and tender in  upper abd quadrants.  Central nervous system: Alert and oriented. No focal neurological deficits.  Data Reviewed: I have personally reviewed following labs and imaging studies  CBC:  Recent Labs Lab 01/14/17 0543 01/15/17 0535 01/16/17 0541 01/17/17 0430 01/18/17 0705  WBC 10.3 7.2 8.2 8.1 8.8  HGB 8.0* 8.1* 8.3* 8.0* 7.9*  HCT 25.6* 26.2* 26.7* 25.4* 25.5*  MCV 71.1* 71.4* 71.2* 71.3* 71.2*  PLT 257 249 273 309 0000000   Basic Metabolic Panel:  Recent Labs Lab 01/14/17 0543 01/15/17 0535 01/16/17 0541 01/17/17 0430 01/18/17 0705  NA 134* 136 137 135 136  K 3.5 3.8 3.6 3.6 3.6  CL 104 107 107 105 105  CO2 22 23 22 23 24   GLUCOSE 106* 114* 109* 100* 103*  BUN 7 5* 5* 5* <5*  CREATININE 1.48* 1.55* 1.23 1.17 1.18  CALCIUM 8.6* 8.8* 8.8* 8.7* 8.7*   Liver Function Tests: No results for input(s): AST, ALT, ALKPHOS, BILITOT, PROT, ALBUMIN in the last 168 hours. No results for input(s): LIPASE, AMYLASE in the last 168 hours. Urine analysis:    Component Value Date/Time   COLORURINE AMBER (A) 01/10/2017 1546   APPEARANCEUR HAZY (A) 01/10/2017 1546   LABSPEC 1.030 01/10/2017 1546   PHURINE 5.0 01/10/2017 1546   GLUCOSEU NEGATIVE 01/10/2017 1546   HGBUR NEGATIVE 01/10/2017 1546   BILIRUBINUR SMALL (A) 01/10/2017 1546   KETONESUR NEGATIVE 01/10/2017 1546   PROTEINUR 100 (A) 01/10/2017 1546   NITRITE NEGATIVE 01/10/2017 1546   LEUKOCYTESUR NEGATIVE 01/10/2017 1546   Radiology Studies: Ct Chest W Contrast Result Date: 01/11/2017 No evidence for pulmonary nodules. 2. Subcentimeter mediastinal and right hilar lymph nodes. Mildly prominent right cardio phrenic lymph node. 3. Partially visualized heterogenous mass at the splenic hilum/tail of the pancreas.   Ct Abdomen Pelvis W Contrast Result Date: 01/10/2017 Large heterogeneous mass in the left upper quadrant core poor aches the splenic flexure/ proximal descending colon , pancreatic tail common splenic hilum. Epicenter of  the lesion appears to be colon tracking cranially and medially into the pancreatic tail and splenic hilum. Pancreatic tail adenocarcinoma would be another consideration. Diffuse mesenteric edema/congestion. Tiny enhancing lesion identified in the subcapsular right liver. Metastatic disease would be a consideration. Mild hepatoduodenal ligament lymphadenopathy. Appendix dilated up to 10 mm diameter with an appendicolith identified in the base of the appendix. No substantial periappendiceal edema or inflammation is evident, but there is fairly diffuse mesenteric congestion/edema as noted above which hinders assessment.    Scheduled Meds: . feeding supplement (ENSURE ENLIVE)  237 mL Oral BID BM  . ferrous sulfate  325 mg Oral BID WC   Continuous Infusions:    LOS: 7 days   Time spent: 20 minutes   Faye Ramsay, MD [facility] Pager [phone]  If 7PM-7AM, please contact night-coverage [email or web address] Password [facility] 01/18/2017, 12:28 PM

## 2017-01-18 NOTE — Progress Notes (Signed)
Nutrition Follow-up  DOCUMENTATION CODES:   Severe malnutrition in context of chronic illness  INTERVENTION:   -Continue Ensure Enlive po BID, each supplement provides 350 kcal and 20 grams of protein  NUTRITION DIAGNOSIS:   Malnutrition related to chronic illness as evidenced by severe depletion of body fat, severe depletion of muscle mass.  Ongoing  GOAL:   Patient will meet greater than or equal to 90% of their needs  Progressing  MONITOR:   PO intake, Diet advancement, Weight trends, I & O's, Labs, Supplement acceptance  REASON FOR ASSESSMENT:   Malnutrition Screening Tool    ASSESSMENT:   56 y.o. previously healthy, admitted with abdominal pain, CT scan showed very large malignant neoplasm on abdomen.   S/p colonoscopy on 01/12/17 which revealed malignant near obstructing tumor in descending colon and at splenic flexure as well as polyp at rectosigmoid colon. Both areas have been biopsied. Per surgeon notes, no plans for surgical interventions due to pt having bowel movements. Oncology plans to consult pending results of biopsies.   Spoke with pt at bedside, who reports great appetite (Po: 5-100%). He had just finished ambulating hallways upon RD visit. He reports he likes the Ensure supplements and is amenable to drink them. Pt is very anxious to go home; discussed importance of good meal and supplement intake to promote healing.   Case discussed with RN, who confirms good appetite and supplement acceptance.   Per surgical service notes, plan to follow-up with Dr. Barry Dienes for resection of colon mass involving spleen and pancreas as an outpatient.   Labs reviewed.   Diet Order:  Diet regular Room service appropriate? Yes; Fluid consistency: Thin  Skin:  Reviewed, no issues  Last BM:  01/17/17  Height:   Ht Readings from Last 1 Encounters:  01/11/17 5\' 9"  (1.753 m)    Weight:   Wt Readings from Last 1 Encounters:  01/11/17 140 lb 12.8 oz (63.9 kg)     Ideal Body Weight:  72.7 kg  BMI:  Body mass index is 20.79 kg/m.  Estimated Nutritional Needs:   Kcal:  1900-2100  Protein:  95-105 g  Fluid:  1.9-2.1 L  EDUCATION NEEDS:   Education needs addressed  Namish Krise A. Jimmye Norman, RD, LDN, CDE Pager: [phone] After hours Pager: [phone]

## 2017-01-19 MED ORDER — FERROUS SULFATE 325 (65 FE) MG PO TABS: 325.0000 mg | ORAL_TABLET | Freq: Two times a day (BID) | ORAL | 0 refills | Status: DC

## 2017-01-19 MED ORDER — OXYCODONE-ACETAMINOPHEN 5-325 MG PO TABS: 1.0000 | ORAL_TABLET | ORAL | 0 refills | Status: DC | PRN

## 2017-01-19 NOTE — Clinical Social Work Note (Signed)
CSW consulted for ALF placement. Pt is ready for discharge today. Pt reports that he will be returning home with a friend and has a ride. CSW provided ALF list for future reference. Pt shared that he does not have the funds for placement. RNCM [facility]. CSW notified RN. CSW is signing off as no further needs identified.  Jeff [name], MSW, LCSW  Clinical Social Worker  [phone]

## 2017-01-19 NOTE — Care Management Note (Addendum)
Case Management Note  Patient Details  Name: Jeff [name] MRN: [id number] Date of Birth: Jul 03, 1961  Subjective/Objective:                    Action/Plan: Patient's daughter [name] called wanting CM or SW to apply for disability and Medicaid for her father. Explained he / she will have to go to [place] for same. [name] wanting to speak to financial counselor , gave [name] the phone number for [facility] and called FC left voice mail asking a FC to call daughter   Appt scheduled at [facility] on 01/28/2017 at 70 am  [facility] letter given   Expected Discharge Date:                  Expected Discharge Plan:  Home/Self Care  In-House Referral:  NA, Financial Counselor  Discharge planning Services  CM Consult  Post Acute Care Choice:  NA Choice offered to:  NA  DME Arranged:  N/A DME Agency:  NA  HH Arranged:  NA HH Agency:  NA  Status of Service:  Completed, signed off  If discussed at [place] of Stay Meetings, dates discussed:    Additional Comments:  Marilu Favre, RN 01/19/2017, 10:30 AM

## 2017-01-19 NOTE — Progress Notes (Signed)
Pt discharged to home.  Discharge instructions explained to pt.  Pt has no questions at the time of discharge.  Pt states he has all belongings.  IV removed.  Pt ambulated off unit on his own.   
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
## 2017-01-19 NOTE — Progress Notes (Signed)
Dilaudid 2mg  removed to administer to pt. Pt did not want pain medicine.  Was not returned to [facility] before discharge.  Wasted 2mg  of dilaudid in sink.  Witnessed by Mayra Neer, RN

## 2017-01-20 ENCOUNTER — Encounter: Payer: Self-pay | Admitting: Hematology

## 2017-01-20 ENCOUNTER — Ambulatory Visit (HOSPITAL_BASED_OUTPATIENT_CLINIC_OR_DEPARTMENT_OTHER): Payer: Medicaid Other | Admitting: Hematology

## 2017-01-20 ENCOUNTER — Telehealth: Payer: Self-pay | Admitting: Hematology

## 2017-01-20 ENCOUNTER — Encounter: Payer: Self-pay | Admitting: *Deleted

## 2017-01-20 ENCOUNTER — Telehealth: Payer: Self-pay | Admitting: *Deleted

## 2017-01-20 VITALS — BP 131/100 | HR 82 | Temp 98.7°F | Resp 16 | Ht 69.0 in | Wt 140.5 lb

## 2017-01-20 DIAGNOSIS — R1012 Left upper quadrant pain: Secondary | ICD-10-CM | POA: Diagnosis not present

## 2017-01-20 DIAGNOSIS — E46 Unspecified protein-calorie malnutrition: Secondary | ICD-10-CM | POA: Diagnosis not present

## 2017-01-20 DIAGNOSIS — D500 Iron deficiency anemia secondary to blood loss (chronic): Secondary | ICD-10-CM

## 2017-01-20 DIAGNOSIS — C186 Malignant neoplasm of descending colon: Secondary | ICD-10-CM | POA: Diagnosis not present

## 2017-01-20 DIAGNOSIS — R7881 Bacteremia: Secondary | ICD-10-CM

## 2017-01-20 DIAGNOSIS — C762 Malignant neoplasm of abdomen: Secondary | ICD-10-CM

## 2017-01-20 DIAGNOSIS — C786 Secondary malignant neoplasm of retroperitoneum and peritoneum: Secondary | ICD-10-CM

## 2017-01-20 DIAGNOSIS — R634 Abnormal weight loss: Secondary | ICD-10-CM

## 2017-01-20 MED ORDER — PROCHLORPERAZINE MALEATE 10 MG PO TABS: 10.0000 mg | ORAL_TABLET | Freq: Four times a day (QID) | ORAL | 1 refills | Status: DC | PRN

## 2017-01-20 MED ORDER — LIDOCAINE-PRILOCAINE 2.5-2.5 % EX CREA: TOPICAL_CREAM | CUTANEOUS | 3 refills | Status: DC

## 2017-01-20 MED ORDER — MORPHINE SULFATE 15 MG PO TABS: 15.0000 mg | ORAL_TABLET | ORAL | 0 refills | Status: DC | PRN

## 2017-01-20 MED ORDER — ONDANSETRON HCL 8 MG PO TABS
8.0000 mg | ORAL_TABLET | Freq: Two times a day (BID) | ORAL | 1 refills | Status: DC | PRN
Start: 2017-01-20 — End: 2017-01-27

## 2017-01-20 MED FILL — MORPHINE SULFATE IR 15 MG T: 15 | 5 days supply | Qty: 30 | Fill #0
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
## 2017-01-20 NOTE — Progress Notes (Signed)
Oncology Nurse Navigator Documentation  Oncology Nurse Navigator Flowsheets 01/20/2017  Navigator Location [facility]  Referral date to RadOnc/MedOnc 01/20/2017  Navigator Encounter Type Letter/Fax/Email  Abnormal Finding Date 01/10/2017  Confirmed Diagnosis Date 01/12/2017  Barriers/Navigation Needs Coordination of Care  Interventions Coordination of Care--case presented in tumor board today: Foundation One on path; FOLFOX x 4 cycles, then rescan; if good response send to tertiary center for surgery.   Coordination of Care Other--chart to [name] to determine new patient visit date. Email to [facility] Pathology for Foundation One testing on case below and copy of email to collaborative nurse to follow up.  Time Spent with Patient 15  Patient: Jeff [name], Jeff [name] Collected: 01/12/2017 Client: [place] Accession: [id number] Received: 01/13/2017 [name] [name]: 1961/10/03 Age: 56 Gender: M Reported: 01/14/2017 [address] Patient Ph: [phone] MRN #: [id number] [place], [place] [address] Visit #: [id number] Chart #: Phone: [phone] Fax: CC: Etta Quill, DO *Per tumor board discussion; splenic and hilum and pancreatic tail tracks up is likely direct extension. Distal peritoneal met is below left kidney and is separate mets. Risk of perforation is no higher with chemo than without.* REPORT

## 2017-01-20 NOTE — Telephone Encounter (Signed)
Gave patient avs report and appointments for January and February. Left message for IR - IR will call patient re port placement.

## 2017-01-20 NOTE — Telephone Encounter (Signed)
Oncology Nurse Navigator Documentation  Oncology Nurse Navigator Flowsheets 01/20/2017  Navigator Location [facility]  Referral date to RadOnc/MedOnc -  Navigator Encounter Type Introductory phone call  Abnormal Finding Date -  Confirmed Diagnosis Date -  Barriers/Navigation Needs -  Interventions -  Coordination of Care -  Time Spent with Patient -  Spoke with patient and provided new patient appointment for today at 1 pm--arrive at 12:45. He appreciates the appointment so soon. Informed of location of [place], valet service, and registration process. Reminded to bring insurance cards and a current medication list, including supplements. Patient verbalizes understanding. Notified HIM.

## 2017-01-20 NOTE — Progress Notes (Signed)
START ON PATHWAY REGIMEN - Colorectal  [id number]: mFOLFOX6 + Bevacizumab   A cycle is every 14 days:     Oxaliplatin (Eloxatin(R)) 85 mg/m2 in 250 mL D5W IV over 2 hours day 1, q14 days Dose Mod: None     Leucovorin 400 mg/m2 in 250 mL D5W IV over 2 hours day 1, q14 days,  followed immediately by Dose Mod: None     5-Fluorouracil 400 mg/m2 IV bolus over 2-4 minutes day 1, q14 days Dose Mod: None     5-Fluorouracil 2,400 mg/m2 in _____mL NS CIV as a 46 hour infusion starting on day 1, q14 days Dose Mod: None     Bevacizumab (Avastin(R)) 5 mg/kg in 100 mL NS IV every 2 weeks.  Administer first dose over 90 minutes if tolerated second dose over 60 minutes and if tolerated all subsequent doses over 30 minutes Dose Mod: None Additional Orders: **Note: order sheet contains two q14 day cycles**  **Always confirm dose/schedule in your pharmacy ordering system**    Patient Characteristics: Metastatic Colorectal, First Line, Nonsurgical Candidate, KRAS Mutation Positive/Unknown, BRAF Wild-Type/Unknown, PS = 0,1; Bevacizumab Eligible Current evidence of distant metastases? Yes AJCC T Category: TX AJCC N Category: NX AJCC M Category: Staged < 8th Ed. AJCC 8 Stage Grouping: IVC BRAF Mutation Status: Awaiting Test Results KRAS/NRAS Mutation Status: Awaiting Test Results Line of therapy: First Line Would you be surprised if this patient died  in the next year? I would be surprised if this patient died in the next year Performance Status: PS = 0, 1 Bevacizumab Eligibility: Eligible  Intent of Therapy: Non-Curative / Palliative Intent, Discussed with Patient

## 2017-01-20 NOTE — Progress Notes (Signed)
Oncology Nurse Navigator Documentation  Oncology Nurse Navigator Flowsheets 01/20/2017  Navigator Location [facility]  Referral date to RadOnc/MedOnc -  Navigator Encounter Type Initial MedOnc  Abnormal Finding Date -  Confirmed Diagnosis Date -  Patient Visit Type MedOnc;Initial  Treatment Phase Pre-Tx/Tx Discussion  Barriers/Navigation Needs Education;Financial  Education Newly Diagnosed Cancer Education;Pain/ Symptom Management;Understanding Cancer/ Treatment Options;Concerns with Finances/ Eligibility;Concerns with Insurance Coverage  Interventions Education;Referrals  Referrals Financial Counseling;Social Work  Coordination of Care -  Education Method Verbal;Written;Teach-back  Support Groups/Services GI Support Group;[place] and Road to Recovery;Other---[place] Support Services  Acuity Level 2  Time Spent with Patient 60  Met with patient and daughter, Jeff [name] during new patient visit. Explained the role of the GI Nurse Navigator and provided New Patient Packet with information on: 1. Colon cancer--CEA testing, anatomy of colon, PAC info 2. Support groups 3. Advanced Directives 4. Fall Safety Plan Answered questions, reviewed current treatment plan using TEACH back and provided emotional support. Provided copy of current treatment plan. Daughter, Jeff [name] reports she will be the point person for her father's care. She says she is the only child he has that will do anything to help him. Prefers to be called for all important matters. Jeff [name] does appear to have limited understanding of his illness and seems slow to respond to questions. He is anxious to begin treatment. He will be seeing the financial advocate, Jeff [name] today after his visit for advice on financial assistance process. He has appointment at [place] on 01/28/17 and daughter made aware that this is where he will apply for the [facility] discount/orange  card. Explained importance of keeping his stools soft and moving every day to decrease risk of obstruction. Provided him samples of Ensure Enlive as well and will arrange for dietician to see him when he gets his IV iron treatment next week.   Jeff Elks, RN, BSN GI Oncology [place]

## 2017-01-20 NOTE — Patient Instructions (Signed)
Care Plan Summary- 01/20/2017 Name: Jeff [name]     [name]: 01-10-1961 Your Medical Team:  Medical Oncologist:  Dr. [name] Radiation Oncologist:    Surgeon:   Dr. Lurena Joiner Kinsinger/ Dr. Stark Klein Type of Cancer: Adenocarcinoma of Colon  Stage/Grade:  *Exact staging of your cancer is based on size of the tumor, depth of invasion, involvement of lymph nodes or not, and whether or not the cancer has spread beyond the primary site.   Recommendations: Based on information available as of today's consult. Recommendations may change depending on the results of further tests or exams. 1) Chemotherapy with FOLFOX (Oxaliplatin, Fluorouracil, Leucovorin) every 2 weeks for 4 cycles then rescan-start approximately 02/01/17       2) IV iron infusion next week and week of 2/5       3) Will see Dr. Barry Dienes in future-[name] will make decision when  ___________________________________________________________________ Next Steps:  1) Chemo education class and labs next week w/iron treatment 2) Keep bowels moving with MiraLax twice daily or Senna-S taking 1-2 tabs once per day or twice per day if necessary 3) Interventional radiology will call you with port appointment Questions? Merceda Elks, RN, BSN at [phone]. Manuela Schwartz is your Oncology Nurse Navigator and is available to assist you while you're receiving your [place].

## 2017-01-20 NOTE — Progress Notes (Signed)
Met with uninsured new patient and his daughter referred by [facility] RN whom needs assistance with prescriptions and other financial assistance.   Reviewed patient's chart and read notes from recent hospital admission. Patient was screened and not found eligible for Medicaid due to not being legally blind,disabled, 40 or older or have small children in the home. I gave an application for Medicaid to complete, being that it is needed for other programs. Patient's daughter completed and had patient to sign. I faxed to [place]. Fax received ok per confirmation sheet. I also gave a [place] [employer] and advised them that he would automatically receive a 55% discount for being uninsured but may complete the application to see if he qualifies for any additional assistance and supporting documents must be submitted with application. Patient's daughter states she will bring it back on 01/27/17 along with documents on 01/27/17.  Patient was previously working prior to hospital admission. His daughter was able to go to his truck to get pay history that he had from Dec 2017. Approved patient for one-time $400 [place]. Advised that he would need to take prescription to [place] along with the approval to obtain his medication. Gave a list of expenses it covers and advised funds may need to be reserved for oral medications for right now being that he is uninsured and not sure how much his meds will cost. Patient's daughter agreed but pondering on how to pay his other bills such as rent and utilities. Advised daughter to seek assistance from SW(Abigail or Abby Potash) who may be able to provide additional resources in this area. She also wanted to know about the disability process. Advised her that the SW's may be able to instruct with this process.  We spent about an hour going over applications and information. Patient's daughter has my card for any additional financial questions or concerns. She was very emotional and  over-whelmed. Provided her with the number to the social workers for additional support.

## 2017-01-20 NOTE — Patient Instructions (Signed)
Care Plan Summary- 01/20/2017 Name: Jeff [name]     [name]: 1961-11-20 Your Medical Team:  Medical Oncologist:  Dr. [name] Radiation Oncologist:    Surgeon:   Dr. Lurena Joiner Kinsinger/ Dr. Stark Klein Type of Cancer: Adenocarcinoma of Colon  Stage/Grade:  *Exact staging of your cancer is based on size of the tumor, depth of invasion, involvement of lymph nodes or not, and whether or not the cancer has spread beyond the primary site.   Recommendations: Based on information available as of today's consult. Recommendations may change depending on the results of further tests or exams. 1) Chemotherapy with FOLFOX (Oxaliplatin, Fluorouracil, Leucovorin) every 2 weeks for 4 cycles then rescan-start approximately 02/01/17       2) IV iron infusion next week and week of 2/5       3) Will see Dr. Barry Dienes in future-[name] will make decision when  ___________________________________________________________________ Next Steps:  1) Chemo education class and labs next week w/iron treatment 2) Keep bowels moving with MiraLax twice daily or Senna-S taking 1-2 tabs once per day or twice per day if necessary 3) Interventional radiology will call you with port appointment Questions? Merceda Elks, RN, BSN at [phone]. Manuela Schwartz is your Oncology Nurse Navigator and is available to assist you while you're receiving your [place].

## 2017-01-21 ENCOUNTER — Encounter: Payer: Self-pay | Admitting: *Deleted

## 2017-01-21 ENCOUNTER — Telehealth: Payer: Self-pay | Admitting: *Deleted

## 2017-01-21 ENCOUNTER — Telehealth: Payer: Self-pay | Admitting: Hematology

## 2017-01-21 NOTE — Telephone Encounter (Signed)
Per 1/25 staff message from GI navigator I have scheduled appts and notified the navigator

## 2017-01-21 NOTE — Telephone Encounter (Signed)
Received call this am from Dr Doyle Askew stating pt's blood showed staph & she would like pt to come back in for blood cultures x 2. Discussed with Dr Burr Medico & orders placed & scheduler to call pt. Informed pt that Scheduler should call him for appt today.

## 2017-01-21 NOTE — Telephone Encounter (Signed)
Oncology Nurse Navigator Documentation  Oncology Nurse Navigator Flowsheets 01/20/2017 01/21/2017  [facility]  Navigator Encounter Type Initial MedOnc Telephone  Telephone - Outgoing Call;Appt Confirmation/Clarification  Abnormal Finding Date - -  Confirmed Diagnosis Date - -  Patient Visit Type MedOnc;Initial -  Treatment Phase Pre-Tx/Tx Discussion -  Barriers/Navigation Needs Education;Financial Coordination of Care  Education Newly Diagnosed Cancer Education;Pain/ Symptom Management;Understanding Cancer/ Treatment Options;Concerns with Finances/ Eligibility;Concerns with Insurance Coverage -  Interventions Education;Referrals Coordination of Care  Referrals Financial Counseling;Social Work -  Coordination of Care - Appts  Education Method Verbal;Written;Teach-back Verbal;Teach-back;Written  Support Groups/Services GI Support Group;[place];Other -  Acuity Level 2 -  Time Spent with Patient 80 15  Called daughter and reviewed all appointments with her including the new appointment for [facility] (including prep and needing driver). Added dietician to see him in tx area on 2/5 tx day. Cancelled labs on 1/29 since he is having labs on 1/26 per MD order. Printed calendar and will have phlebotomist give to him tomorrow.  Daughter requesting letter from MD stating he is not able to work due to medical condition. This can allow him to obtain his full benefit of food stamps, since they are currently basing his benefit on past income. Forwarded request to MD. She will pick up letter when ready.

## 2017-01-21 NOTE — Telephone Encounter (Signed)
Called pt to sch lab only appt. Pt stated he could come tomorrow. Gave pt appt date/time 1/26 at 1030 am

## 2017-01-21 NOTE — Addendum Note (Signed)
Addended by: [name] on: 01/21/2017 10:40 AM   Modules accepted: Orders

## 2017-01-21 NOTE — Telephone Encounter (Signed)
Oncology Nurse Navigator Documentation  Oncology Nurse Navigator Flowsheets 01/21/2017  Navigator Location [facility]  Referral date to RadOnc/MedOnc -  Navigator Encounter Type Telephone  Abnormal Finding Date -  Confirmed Diagnosis Date -  Patient Visit Type -  Treatment Phase -  Barriers/Navigation Needs Coordination of Care  Education -  Interventions Coordination of Care--message to infusion scheduler for IV infusion appointment-hopefully for 1/31 after chemo class  Referrals -  Coordination of Care Appts--port for 2/2/18a t 1230/1430 at [facility]. Will call daughter with appointment once the IV iron appointment is scheduled.  Education Method -  Support Groups/Services -  Acuity -  Time Spent with Patient 15

## 2017-01-21 NOTE — Progress Notes (Signed)
[place] Work  Clinical Social Work was referred by pt's daughter and Development worker, community for assessment of psychosocial needs due to complex financial concerns.  Clinical Social Worker spoke with patient and daughter to offer support and assess for needs.  Daughter shared complex financial history with added stress of new stage IV cancer diagnosis. CSW introduced self, explained role of CSW and resources to assist through [facility]. CSW working on referrals to [name] to assist with disability application and researching additional financial resources for pt. Pt at risk for homelessness due to financial issues. CSW to begin applications and will follow up with pt and daughter when more resources are identified. CSW to also meet with pt at appt. On 01/27/17.    Clinical Social Work interventions:  Resource education and assistance  Loren Racer, [place] Worker [place]  [place] Phone: [phone] Fax: [phone]

## 2017-01-22 ENCOUNTER — Other Ambulatory Visit: Payer: Self-pay | Admitting: Emergency Medicine

## 2017-01-22 ENCOUNTER — Other Ambulatory Visit: Payer: Self-pay | Admitting: *Deleted

## 2017-01-22 ENCOUNTER — Other Ambulatory Visit (HOSPITAL_BASED_OUTPATIENT_CLINIC_OR_DEPARTMENT_OTHER): Payer: Medicaid Other

## 2017-01-22 DIAGNOSIS — D500 Iron deficiency anemia secondary to blood loss (chronic): Secondary | ICD-10-CM | POA: Diagnosis not present

## 2017-01-22 DIAGNOSIS — R7881 Bacteremia: Secondary | ICD-10-CM

## 2017-01-22 DIAGNOSIS — C186 Malignant neoplasm of descending colon: Secondary | ICD-10-CM

## 2017-01-22 DIAGNOSIS — C786 Secondary malignant neoplasm of retroperitoneum and peritoneum: Secondary | ICD-10-CM

## 2017-01-22 DIAGNOSIS — C762 Malignant neoplasm of abdomen: Secondary | ICD-10-CM

## 2017-01-22 LAB — COMPREHENSIVE METABOLIC PANEL
ALBUMIN: 2.7 g/dL — AB (ref 3.5–5.0)
ALK PHOS: 69 U/L (ref 40–150)
ALT: <6 U/L (ref 0–55)
ANION GAP: 10 meq/L (ref 3–11)
AST: 19 U/L (ref 5–34)
BILIRUBIN TOTAL: 0.32 mg/dL (ref 0.20–1.20)
BUN: 8.8 mg/dL (ref 7.0–26.0)
CO2: 26 mEq/L (ref 22–29)
Calcium: 9.6 mg/dL (ref 8.4–10.4)
Chloride: 101 mEq/L (ref 98–109)
Creatinine: 1.3 mg/dL (ref 0.7–1.3)
EGFR: 72 mL/min/{1.73_m2} — AB (ref >90)
Glucose: 84 mg/dl (ref 70–140)
Potassium: 4.2 mEq/L (ref 3.5–5.1)
Sodium: 137 mEq/L (ref 136–145)
TOTAL PROTEIN: 9.1 g/dL — AB (ref 6.4–8.3)

## 2017-01-22 LAB — FERRITIN: Ferritin: 139 ng/ml (ref 22–316)

## 2017-01-22 LAB — CBC WITH DIFFERENTIAL/PLATELET
BASO%: 1.4 % (ref 0.0–2.0)
Basophils Absolute: 0.1 10*3/uL (ref 0.0–0.1)
EOS ABS: 0.3 10*3/uL (ref 0.0–0.5)
EOS%: 3 % (ref 0.0–7.0)
HCT: 30.1 % — ABNORMAL LOW (ref 38.4–49.9)
HGB: 9.5 g/dL — ABNORMAL LOW (ref 13.0–17.1)
LYMPH%: 13.9 % — AB (ref 14.0–49.0)
MCH: 22.4 pg — ABNORMAL LOW (ref 27.2–33.4)
MCHC: 31.4 g/dL — ABNORMAL LOW (ref 32.0–36.0)
MCV: 71.4 fL — ABNORMAL LOW (ref 79.3–98.0)
MONO#: 0.7 10*3/uL (ref 0.1–0.9)
MONO%: 6.4 % (ref 0.0–14.0)
NEUT%: 75.3 % — ABNORMAL HIGH (ref 39.0–75.0)
NEUTROS ABS: 8 10*3/uL — AB (ref 1.5–6.5)
PLATELETS: 345 10*3/uL (ref 140–400)
RBC: 4.22 10*6/uL (ref 4.20–5.82)
RDW: 19.5 % — ABNORMAL HIGH (ref 11.0–14.6)
WBC: 10.7 10*3/uL — AB (ref 4.0–10.3)
lymph#: 1.5 10*3/uL (ref 0.9–3.3)

## 2017-01-22 LAB — IRON AND TIBC
%SAT: 4 % — ABNORMAL LOW (ref 20–55)
IRON: 12 ug/dL — AB (ref 42–163)
TIBC: 279 ug/dL (ref 202–409)
UIBC: 267 ug/dL (ref 117–376)

## 2017-01-22 LAB — CEA (IN HOUSE-CHCC): CEA (CHCC-In House): 12.31 ng/mL — ABNORMAL HIGH (ref 0.00–5.00)

## 2017-01-25 ENCOUNTER — Encounter: Payer: Self-pay | Admitting: Hematology

## 2017-01-25 NOTE — Progress Notes (Signed)
Left several applications for assistance for drug replacement(Ferahem,Aloxi, and Avastin if needed on physician's desk for signatures. Patient will need to sign and provide gross income to go along with applications on [id number].

## 2017-01-26 ENCOUNTER — Encounter: Payer: Self-pay | Admitting: Hematology

## 2017-01-27 ENCOUNTER — Telehealth: Payer: Self-pay | Admitting: *Deleted

## 2017-01-27 ENCOUNTER — Other Ambulatory Visit: Payer: Self-pay | Admitting: *Deleted

## 2017-01-27 ENCOUNTER — Encounter: Payer: Self-pay | Admitting: *Deleted

## 2017-01-27 ENCOUNTER — Encounter: Payer: Self-pay | Admitting: Hematology

## 2017-01-27 ENCOUNTER — Ambulatory Visit: Payer: Self-pay

## 2017-01-27 ENCOUNTER — Other Ambulatory Visit: Payer: Self-pay

## 2017-01-27 ENCOUNTER — Other Ambulatory Visit: Payer: Self-pay | Admitting: Hematology

## 2017-01-27 DIAGNOSIS — D509 Iron deficiency anemia, unspecified: Secondary | ICD-10-CM | POA: Insufficient documentation

## 2017-01-27 DIAGNOSIS — C186 Malignant neoplasm of descending colon: Secondary | ICD-10-CM

## 2017-01-27 DIAGNOSIS — D500 Iron deficiency anemia secondary to blood loss (chronic): Secondary | ICD-10-CM

## 2017-01-27 MED ORDER — ONDANSETRON HCL 8 MG PO TABS: 8.0000 mg | ORAL_TABLET | Freq: Two times a day (BID) | ORAL | 1 refills | Status: DC | PRN

## 2017-01-27 MED ORDER — LIDOCAINE-PRILOCAINE 2.5-2.5 % EX CREA: TOPICAL_CREAM | CUTANEOUS | 3 refills | Status: DC

## 2017-01-27 MED ORDER — PROCHLORPERAZINE MALEATE 10 MG PO TABS: 10.0000 mg | ORAL_TABLET | Freq: Four times a day (QID) | ORAL | 1 refills | Status: DC | PRN

## 2017-01-27 MED FILL — ONDANSETRON HCL 8 MG TABLET: 8 | 15 days supply | Qty: 30 | Fill #0

## 2017-01-27 MED FILL — LIDOCAINE-PRILOCAINE CREAM: 2.5-2.5 | 10 days supply | Qty: 30 | Fill #0

## 2017-01-27 MED FILL — PROCHLORPERAZINE 10 MG TAB: 10 | 7 days supply | Qty: 30 | Fill #0

## 2017-01-27 NOTE — Telephone Encounter (Signed)
Daughter [name] called requesting refill of pain medication.   They never got the oxycodone from [facility] due to expense.  They now have some grant money and used that to get script of MSIR 15mg  #30 from [facility].  He has #10 or #11 left.  She understands that she will have to pick up script here and use grant money at [employer].  Will check with [name] to see if she wants him to use the morphine or if she wants him to have roxicet.   Pain relieve is "ok" with morphine per dtr. , but not using every 4hrs (or else he would have already been out of this.)  Please call dtr at [phone] when script is ready.

## 2017-01-27 NOTE — Progress Notes (Signed)
[place] Work  Clinical Social Work was to meet with pt today during chemo, but then chemo was cancelled. CSW unaware that pt had an appointment with the [facility] as well. [place] met with pt and daughter and completed disability application. CSW contacted pt and daughter via phone due to missing them today. They were approved for the grant and daughter plans to bring pt to first treatment next week. CSW to meet with pt and daughter at that appointment to complete SCAT application. Per daughter, pt continues to be behind on rent, but has not seen eviction notice to date. [place] to review application re. [place] program as well. CSW to follow.   Clinical Social Work interventions:  Reassess needs Continuity of services  Loren Racer, [place] Worker [place]  [place] Phone: [phone] Fax: [phone]

## 2017-01-27 NOTE — Progress Notes (Signed)
Feraheme not authorizede. IV discontinued and patient discharged in stable condition. Patient will return on 02/01/17 to receive 1st chemotherapy/1st feraheme.

## 2017-01-27 NOTE — Progress Notes (Signed)
Met with patient and daughter whom brought needed income documents and signed applications for assistance. Faxed Feraheme application to [employer]. Fax received ok per confirmation sheet.  Faxed application for Aloxi assistance to [employer]. Fax received ok per confirmation sheet.

## 2017-01-28 ENCOUNTER — Other Ambulatory Visit: Payer: Self-pay | Admitting: Radiology

## 2017-01-28 ENCOUNTER — Other Ambulatory Visit: Payer: Self-pay | Admitting: Physician Assistant

## 2017-01-28 ENCOUNTER — Encounter: Payer: Self-pay | Admitting: Licensed Clinical Social Worker

## 2017-01-28 ENCOUNTER — Encounter: Payer: Self-pay | Admitting: Family Medicine

## 2017-01-28 ENCOUNTER — Ambulatory Visit: Payer: Medicaid Other | Attending: Family Medicine | Admitting: Family Medicine

## 2017-01-28 ENCOUNTER — Encounter: Payer: Self-pay | Admitting: Pharmacist

## 2017-01-28 ENCOUNTER — Encounter: Payer: Self-pay | Admitting: Student in an Organized Health Care Education/Training Program

## 2017-01-28 ENCOUNTER — Encounter: Payer: Self-pay | Admitting: Hematology

## 2017-01-28 VITALS — BP 125/84 | HR 81 | Temp 98.0°F | Ht 69.0 in | Wt 134.8 lb

## 2017-01-28 DIAGNOSIS — E43 Unspecified severe protein-calorie malnutrition: Secondary | ICD-10-CM | POA: Diagnosis not present

## 2017-01-28 DIAGNOSIS — C186 Malignant neoplasm of descending colon: Secondary | ICD-10-CM | POA: Insufficient documentation

## 2017-01-28 DIAGNOSIS — K5900 Constipation, unspecified: Secondary | ICD-10-CM | POA: Diagnosis not present

## 2017-01-28 DIAGNOSIS — D509 Iron deficiency anemia, unspecified: Secondary | ICD-10-CM | POA: Diagnosis not present

## 2017-01-28 LAB — CULTURE, BLOOD (SINGLE)

## 2017-01-28 NOTE — Progress Notes (Signed)
Received shipment request form from [employer] for [facility] for doctor to complete. Left for doctor to sign and return to me.      Received approval letter for free Aloxi from [facility] Patient Assistance.Marland Kitchen Approval 01/28/17-01/27/18. Email sent to drug replacement communication team. Copy placed to be scanned by HIM.

## 2017-01-28 NOTE — Telephone Encounter (Signed)
Yes, please refill morphine. Thanks.   [name] MD

## 2017-01-28 NOTE — Progress Notes (Signed)
Received signed product request from for [facility]. Faxed to [place]. Fax received ok per confirmation sheet.
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
## 2017-01-28 NOTE — BH Specialist Note (Signed)
Session Start time: 12:05 PM   End Time: 12:15 PM Total Time:  10 minutes Type of Service: [place]: No.   Interpreter Name & Language: N/A # [facility] Visits July 2017-June 2018: 1st   SUBJECTIVE: Jeff [name] is a 56 y.o. male  Pt. was referred by Dr. Jarold Song for:  depression and community resources (housing). Pt. reports the following symptoms/concerns: Pt was accompanied by adult daughter during visit. Pt's daughter reported that pt has been unable to work since January 2018. He was unable to pay rent for the months of January and February Duration of problem:  Two months Severity: moderate Previous treatment: None reported. Pt is not interested in behavioral health services.   OBJECTIVE: Mood: Depressed & Affect: Constricted Risk of harm to self or others: Pt denied SI/HI Assessments administered: PHQ-9; GAD-7  LIFE CONTEXT:  Family & Social: Pt receives strong support from adult daughter. School/ Work: Pt is unable to work due to diagnosis. He completed applications for Medicaid and SSI through the [place] Self-Care: No report of substance use Life changes: Pt recently diagnosed with cancer and is unable to work at present time. He may be evicted from home due to inability to pay for last two months rent.  What is important to pt/family (values): Family   GOALS ADDRESSED:  Obtain resources for housing  INTERVENTIONS: Solution Focused, Strength-based and Supportive   ASSESSMENT:  Pt currently experiencing depression triggered by recent diagnosis of cancer. Pt was accompanied by adult daughter during visit. Pt's daughter reported that pt has been unable to work since January 2018. He may be evicted from home due to inability to pay for last two months rent. Pt may benefit from psychotherapy. LCSWA educated pt on the cycle of depression and discussed healthy coping skills to decrease symptoms. Pt is not interested in initiating  behavioral health services. LCSWA provided family with community resources to assist with obtaining affordable housing. Family reported that they will follow up with [facility]'s [facility] regarding applications for medicaid and social security to assist with financial strain. LCSWA discussed options for personal care services for pt with daughter after approval of medicaid.      PLAN: 1. F/U with behavioral health clinician: Pt was encouraged to contact [place] if symptoms worsen or fail to improve to schedule behavioral appointments at [facility]. 2. Behavioral Health meds: None reported 3. Behavioral recommendations: LCSWA recommends that pt utilize resources to obtain housing. Pt is encouraged to schedule follow up appointment with LCSWA 4. Referral: Brief Counseling/Psychotherapy, [employer], Problem-solving teaching/coping strategies, Psychoeducation and Supportive Counseling 5. From scale of 1-10, how likely are you to follow plan: [place], MSW, [place] Worker 01/29/17 1:56 PM  Warmhandoff:   Warm Hand Off Completed.

## 2017-01-29 ENCOUNTER — Encounter (HOSPITAL_COMMUNITY): Payer: Self-pay

## 2017-01-29 ENCOUNTER — Ambulatory Visit (HOSPITAL_COMMUNITY)
Admission: RE | Admit: 2017-01-29 | Discharge: 2017-01-29 | Disposition: A | Discharge status: Home / Self Care | Payer: Medicaid Other | Source: Ambulatory Visit | Attending: Hematology | Admitting: Hematology

## 2017-01-29 ENCOUNTER — Other Ambulatory Visit: Payer: Self-pay | Admitting: Hematology

## 2017-01-29 ENCOUNTER — Other Ambulatory Visit: Payer: Self-pay | Admitting: *Deleted

## 2017-01-29 DIAGNOSIS — C762 Malignant neoplasm of abdomen: Secondary | ICD-10-CM

## 2017-01-29 DIAGNOSIS — D630 Anemia in neoplastic disease: Secondary | ICD-10-CM | POA: Insufficient documentation

## 2017-01-29 DIAGNOSIS — I700 Atherosclerosis of aorta: Secondary | ICD-10-CM | POA: Insufficient documentation

## 2017-01-29 DIAGNOSIS — C186 Malignant neoplasm of descending colon: Secondary | ICD-10-CM | POA: Insufficient documentation

## 2017-01-29 DIAGNOSIS — C189 Malignant neoplasm of colon, unspecified: Secondary | ICD-10-CM | POA: Diagnosis present

## 2017-01-29 DIAGNOSIS — C169 Malignant neoplasm of stomach, unspecified: Secondary | ICD-10-CM | POA: Insufficient documentation

## 2017-01-29 HISTORY — PX: IR GENERIC HISTORICAL: IMG1180011

## 2017-01-29 LAB — CBC
HEMATOCRIT: 27.6 % — AB (ref 39.0–52.0)
Hemoglobin: 8.4 g/dL — ABNORMAL LOW (ref 13.0–17.0)
MCH: 21.4 pg — ABNORMAL LOW (ref 26.0–34.0)
MCHC: 30.4 g/dL (ref 30.0–36.0)
MCV: 70.2 fL — ABNORMAL LOW (ref 78.0–100.0)
PLATELETS: 251 10*3/uL (ref 150–400)
RBC: 3.93 MIL/uL — AB (ref 4.22–5.81)
RDW: 17.8 % — AB (ref 11.5–15.5)
WBC: 7.8 10*3/uL (ref 4.0–10.5)

## 2017-01-29 LAB — PROTIME-INR
INR: 1.07
Prothrombin Time: 13.9 seconds (ref 11.4–15.2)

## 2017-01-29 LAB — APTT: APTT: 31 s (ref 24–36)

## 2017-01-29 MED ORDER — LIDOCAINE-EPINEPHRINE (PF) 2 %-1:200000 IJ SOLN
INTRAMUSCULAR | Status: AC | PRN
  Administered 2017-01-29: 10 mL

## 2017-01-29 MED ORDER — LIDOCAINE HCL 1 % IJ SOLN
INTRAMUSCULAR | Status: AC
  Filled 2017-01-29: qty 20

## 2017-01-29 MED ORDER — MIDAZOLAM HCL 2 MG/2ML IJ SOLN
INTRAMUSCULAR | Status: AC | PRN
  Administered 2017-01-29 (×3): 1 mg via INTRAVENOUS

## 2017-01-29 MED ORDER — CEFAZOLIN SODIUM-DEXTROSE 2-4 GM/100ML-% IV SOLN
2.0000 g | Freq: Once | INTRAVENOUS | Status: AC
  Administered 2017-01-29: 2 g via INTRAVENOUS
  Filled 2017-01-29 (×2): qty 100

## 2017-01-29 MED ORDER — FENTANYL CITRATE (PF) 100 MCG/2ML IJ SOLN
INTRAMUSCULAR | Status: AC
  Filled 2017-01-29: qty 4

## 2017-01-29 MED ORDER — LIDOCAINE HCL 1 % IJ SOLN
INTRAMUSCULAR | Status: AC | PRN
  Administered 2017-01-29: 10 mL

## 2017-01-29 MED ORDER — SODIUM CHLORIDE 0.9 % IV SOLN
INTRAVENOUS | Status: DC
  Administered 2017-01-29: 13:00:00 via INTRAVENOUS

## 2017-01-29 MED ORDER — LIDOCAINE-EPINEPHRINE (PF) 2 %-1:200000 IJ SOLN
INTRAMUSCULAR | Status: AC
  Filled 2017-01-29: qty 20

## 2017-01-29 MED ORDER — MIDAZOLAM HCL 2 MG/2ML IJ SOLN
INTRAMUSCULAR | Status: AC
  Filled 2017-01-29: qty 4

## 2017-01-29 MED ORDER — FLUMAZENIL 0.5 MG/5ML IV SOLN
INTRAVENOUS | Status: AC
  Filled 2017-01-29: qty 5

## 2017-01-29 MED ORDER — FENTANYL CITRATE (PF) 100 MCG/2ML IJ SOLN
INTRAMUSCULAR | Status: AC | PRN
  Administered 2017-01-29 (×2): 50 ug via INTRAVENOUS

## 2017-01-29 MED ORDER — NALOXONE HCL 0.4 MG/ML IJ SOLN
INTRAMUSCULAR | Status: AC
  Filled 2017-01-29: qty 1

## 2017-01-29 MED ORDER — HEPARIN SOD (PORK) LOCK FLUSH 100 UNIT/ML IV SOLN
INTRAVENOUS | Status: AC
  Filled 2017-01-29: qty 5

## 2017-01-29 MED ORDER — MORPHINE SULFATE 15 MG PO TABS
15.0000 mg | ORAL_TABLET | ORAL | 0 refills | Status: DC | PRN
Start: 2017-01-29 — End: 2017-03-29

## 2017-01-29 MED FILL — MORPHINE SULFATE IR 15 MG T: 15 | 5 days supply | Qty: 30 | Fill #0
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
## 2017-01-29 NOTE — Progress Notes (Signed)
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 of the procedure, risks, benefits and alternatives. The patient understands, agrees and consents for the procedure. All questions were addressed. A time out was performed. Maximal barrier sterile technique utilized including caps, mask, sterile gowns, sterile gloves, large sterile drape, hand hygiene, and 2% chlorhexidine scrub. Under sterile conditions and local anesthesia, right internal jugular micropuncture venous access was performed. Access was performed with ultrasound. Images were obtained for documentation. A guide wire was inserted followed by a transitional dilator. This allowed insertion of a guide wire and catheter into the IVC. Measurements were obtained from the SVC / RA junction back to the right IJ venotomy site. In the right infraclavicular chest, a subcutaneous pocket was created over the second anterior rib. This was done under sterile conditions and local anesthesia. 1% lidocaine with epinephrine was utilized for this. A 2.5 cm incision was made in the skin. Blunt dissection was performed to create a subcutaneous pocket over the right pectoralis major muscle. The pocket was flushed with saline vigorously. There was adequate hemostasis. The port catheter  was assembled and checked for leakage. The port catheter was secured in the pocket with two retention sutures. The tubing was tunneled subcutaneously to the right venotomy site and inserted into the SVC/RA junction through a valved peel-away sheath. Position was confirmed with fluoroscopy. Images were obtained for documentation. The patient tolerated the procedure well. No immediate complications. Incisions were closed in a two layer fashion with 4 - 0 Vicryl suture. Dermabond was applied to the skin. The port catheter was accessed, blood was aspirated followed by saline and heparin flushes. Needle was  removed. A dry sterile dressing was applied. IMPRESSION: Ultrasound and fluoroscopically guided right internal jugular single lumen power port catheter insertion. Tip in the SVC/RA junction. Catheter ready for use. Electronically Signed   By: Jerilynn Mages.  Shick M.D.   On: 01/29/2017 15:42   Colonoscopy 01/12/2017 Impression: - Malignant near obstructing tumor in the descending colon and at the splenic flexure. Biopsied. Tattooed. - One 25 mm polyp at the recto-sigmoid colon, removed with a hot snare. Resected and retrieved. Clips (MR unsafe) were placed.  ASSESSMENT & PLAN:  56 y.o.  African-American male, without a significant past medical history, no routine medical care, presented with intermittent rectal bleeding, anemia  and worsening abdominal pain for 3 years.   1. 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  -His case was reviewed in our GI tumor Board, unfortunately he has peritoneum metastasis, with a large 11 cm peritoneal metastasis in the left upper quadrant, which is consistent with peritoneal metastasis. -We reviewed the natural history of metastatic colon cancer. Unfortunately his cancer is incurable at this stage. We discussed the  goal of therapy is palliative, to prolong his life and improve his quality of life. -I previously discussed treatment options systemic palliative chemotherapy. Given his young age and the relatively good performance status, I recommend him to start FOLFOX in a few weeks. The other option of chemotherapy such as FOLFIRI, single agent 5-FU or Xeloda, immunotherapy, targeted therapy with EGFR inhibitors, we'll also discussed with patient. -He has agreed to start first line chemo FOLFOX, lab reviewed, and adequate for treatment, we'll start first cycle today. -The goal of chemotherapy is palliative. -I have requested [facility] one to be done on his biopsy sample, to see if he is a candidate for EGFR inhibitor, or immunotherapy.  The results still pending. -I have according Compazine, Zofran, and endocrine for him. Neutropenia fever precaution, cold sensitivity etc. were reviewed with him again today.   2. Left upper quadrant abdominal pain -His Percocet was to not by his insurance. I have prescribed morphine 68m tablet to take every 4 hours as needed -The patient is taking stool softener and laxative to avoid constipation.  3. Anemia, iron deficiency from GI tumor bleeding  -His our study in the hospital is consistent with iron deficient anemia. His hemoglobin is 8.4, I discussed the option of oral versus IV iron. Due to the moderate anemia, upcoming chemotherapy, I recommend IV Feraheme . Potential side effects, especially allergic reaction including anaphylactic reaction, discussed with patient, he agrees to proceed.  -He will receive IV iron today - he was unable to receive any last week because he was not insured.  4. Weight loss, malnutrition -The patient will continue to drink dietary supplements -He is scheduled with nutrition on 02/01/2017  5. Goal of care discussion  -We again discussed the incurable nature of his cancer, and the overall poor prognosis, especially if he does not have good  response to chemotherapy or progress on chemo -The patient understands the goal of care is palliative. -I recommend DNR/DNI, he will think about it    Plans: -He will receive first dose FOLFOX chemotherapy today. -He will receive IV iron today -He will return for follow up on 02/16/17 before his next treatment.   No orders of the defined types were placed in this encounter.  All questions were answered. The patient knows to  call the clinic with any problems, questions or concerns.  I spent 25 minutes counseling the patient face to face. The total time spent in the appointment was 30 minutes and more than 50% was on counseling.   This document serves as a record of services personally performed by Jeff Merle, MD. It was created on her behalf by Arlyce Harman, a trained medical scribe. The creation of this record is based on the scribe's personal observations and the provider's statements to them. This document has been checked and approved by the attending provider.   Jeff Merle, MD 02/01/2017 1:30 PM

## 2017-01-29 NOTE — Discharge Instructions (Signed)
Implanted Port Insertion, Care After °Refer to this sheet in the next few weeks. These instructions provide you with information on caring for yourself after your procedure. Your health care provider may also give you more specific instructions. Your treatment has been planned according to current medical practices, but problems sometimes occur. Call your health care provider if you have any problems or questions after your procedure. °WHAT TO EXPECT AFTER THE PROCEDURE °After your procedure, it is typical to have the following:  °· Discomfort at the port insertion site. Ice packs to the area will help. °· Bruising on the skin over the port. This will subside in 3-4 days. °HOME CARE INSTRUCTIONS °· After your port is placed, you will get a manufacturer's information card. The card has information about your port. Keep this card with you at all times.   °· Know what kind of port you have. There are many types of ports available.   °· Wear a medical alert bracelet in case of an emergency. This can help alert health care workers that you have a port.   °· The port can stay in for as long as your health care provider believes it is necessary.   °· A home health care nurse may give medicines and take care of the port.   °· You or a family member can get special training and directions for giving medicine and taking care of the port at home.   °SEEK MEDICAL CARE IF:  °· Your port does not flush or you are unable to get a blood return.   °· You have a fever or chills. °SEEK IMMEDIATE MEDICAL CARE IF: °· You have new fluid or pus coming from your incision.   °· You notice a bad smell coming from your incision site.   °· You have swelling, pain, or more redness at the incision or port site.   °· You have chest pain or shortness of breath. °This information is not intended to replace advice given to you by your health care provider. Make sure you discuss any questions you have with your health care provider. °Document  Released: 10/04/2013 Document Revised: 12/19/2013 Document Reviewed: 10/04/2013 °Elsevier Interactive Patient Education © 2017 [employer] ° °Moderate Conscious Sedation, Adult, Care After °These instructions provide you with information about caring for yourself after your procedure. Your health care provider may also give you more specific instructions. Your treatment has been planned according to current medical practices, but problems sometimes occur. Call your health care provider if you have any problems or questions after your procedure. °What can I expect after the procedure? °After your procedure, it is common: °· To feel sleepy for several hours. °· To feel clumsy and have poor balance for several hours. °· To have poor judgment for several hours. °· To vomit if you eat too soon. °Follow these instructions at home: °For at least 24 hours after the procedure:  °· Do not: °¨ Participate in activities where you could fall or become injured. °¨ Drive. °¨ Use heavy machinery. °¨ Drink alcohol. °¨ Take sleeping pills or medicines that cause drowsiness. °¨ Make important decisions or sign legal documents. °¨ Take care of children on your own. °· Rest. °Eating and drinking °· Follow the diet recommended by your health care provider. °· If you vomit: °¨ Drink water, juice, or soup when you can drink without vomiting. °¨ Make sure you have little or no nausea before eating solid foods. °General instructions °· Have a responsible adult stay with you until you are awake and alert. °·   Take over-the-counter and prescription medicines only as told by your health care provider. °· If you smoke, do not smoke without supervision. °· Keep all follow-up visits as told by your health care provider. This is important. °Contact a health care provider if: °· You keep feeling nauseous or you keep vomiting. °· You feel light-headed. °· You develop a rash. °· You have a fever. °Get help right away if: °· You have trouble  breathing. °This information is not intended to replace advice given to you by your health care provider. Make sure you discuss any questions you have with your health care provider. °Document Released: 10/04/2013 Document Revised: 05/18/2016 Document Reviewed: 04/04/2016 °Elsevier Interactive Patient Education © 2017 [employer] ° °

## 2017-01-29 NOTE — Procedures (Signed)
Colon ca  S/p RT IJ POWER PORT  Tip svcra No comp Stable Ready for use Full report in PACS

## 2017-02-01 ENCOUNTER — Encounter: Payer: Self-pay | Admitting: *Deleted

## 2017-02-01 ENCOUNTER — Ambulatory Visit (HOSPITAL_BASED_OUTPATIENT_CLINIC_OR_DEPARTMENT_OTHER): Payer: Medicaid Other

## 2017-02-01 ENCOUNTER — Ambulatory Visit: Payer: Self-pay | Admitting: Nutrition

## 2017-02-01 ENCOUNTER — Encounter: Payer: Self-pay | Admitting: Hematology

## 2017-02-01 ENCOUNTER — Telehealth: Payer: Self-pay | Admitting: Hematology

## 2017-02-01 ENCOUNTER — Ambulatory Visit (HOSPITAL_BASED_OUTPATIENT_CLINIC_OR_DEPARTMENT_OTHER): Payer: Medicaid Other | Admitting: Hematology

## 2017-02-01 ENCOUNTER — Other Ambulatory Visit: Payer: Self-pay

## 2017-02-01 ENCOUNTER — Telehealth: Payer: Self-pay | Admitting: *Deleted

## 2017-02-01 VITALS — BP 121/81 | HR 74 | Temp 98.6°F | Resp 18

## 2017-02-01 VITALS — BP 117/87 | HR 86 | Temp 98.0°F | Resp 18 | Ht 69.0 in | Wt 133.9 lb

## 2017-02-01 DIAGNOSIS — C786 Secondary malignant neoplasm of retroperitoneum and peritoneum: Secondary | ICD-10-CM | POA: Diagnosis not present

## 2017-02-01 DIAGNOSIS — D500 Iron deficiency anemia secondary to blood loss (chronic): Secondary | ICD-10-CM

## 2017-02-01 DIAGNOSIS — E46 Unspecified protein-calorie malnutrition: Secondary | ICD-10-CM | POA: Diagnosis not present

## 2017-02-01 DIAGNOSIS — Z5111 Encounter for antineoplastic chemotherapy: Secondary | ICD-10-CM | POA: Diagnosis not present

## 2017-02-01 DIAGNOSIS — C762 Malignant neoplasm of abdomen: Secondary | ICD-10-CM

## 2017-02-01 DIAGNOSIS — R1012 Left upper quadrant pain: Secondary | ICD-10-CM | POA: Diagnosis not present

## 2017-02-01 DIAGNOSIS — C186 Malignant neoplasm of descending colon: Secondary | ICD-10-CM | POA: Diagnosis not present

## 2017-02-01 DIAGNOSIS — R634 Abnormal weight loss: Secondary | ICD-10-CM | POA: Diagnosis not present

## 2017-02-01 LAB — CBC WITH DIFFERENTIAL/PLATELET
BASO%: 1.2 % (ref 0.0–2.0)
BASOS ABS: 0.1 10*3/uL (ref 0.0–0.1)
EOS ABS: 0.3 10*3/uL (ref 0.0–0.5)
EOS%: 3.2 % (ref 0.0–7.0)
HEMATOCRIT: 26.7 % — AB (ref 38.4–49.9)
HEMOGLOBIN: 8.4 g/dL — AB (ref 13.0–17.1)
LYMPH#: 1.8 10*3/uL (ref 0.9–3.3)
LYMPH%: 20.8 % (ref 14.0–49.0)
MCH: 21.8 pg — AB (ref 27.2–33.4)
MCHC: 31.4 g/dL — AB (ref 32.0–36.0)
MCV: 69.6 fL — AB (ref 79.3–98.0)
MONO#: 0.8 10*3/uL (ref 0.1–0.9)
MONO%: 9.4 % (ref 0.0–14.0)
NEUT#: 5.5 10*3/uL (ref 1.5–6.5)
NEUT%: 65.4 % (ref 39.0–75.0)
PLATELETS: 201 10*3/uL (ref 140–400)
RBC: 3.83 10*6/uL — ABNORMAL LOW (ref 4.20–5.82)
RDW: 19.4 % — ABNORMAL HIGH (ref 11.0–14.6)
WBC: 8.5 10*3/uL (ref 4.0–10.3)

## 2017-02-01 LAB — COMPREHENSIVE METABOLIC PANEL
ALBUMIN: 2.7 g/dL — AB (ref 3.5–5.0)
ALK PHOS: 68 U/L (ref 40–150)
ALT: <6 U/L (ref 0–55)
ANION GAP: 9 meq/L (ref 3–11)
AST: 16 U/L (ref 5–34)
BUN: 9.1 mg/dL (ref 7.0–26.0)
CALCIUM: 8.9 mg/dL (ref 8.4–10.4)
CHLORIDE: 101 meq/L (ref 98–109)
CO2: 24 mEq/L (ref 22–29)
CREATININE: 0.9 mg/dL (ref 0.7–1.3)
EGFR: >90 mL/min/{1.73_m2} (ref >90)
Glucose: 132 mg/dl (ref 70–140)
POTASSIUM: 3.3 meq/L — AB (ref 3.5–5.1)
Sodium: 134 mEq/L — ABNORMAL LOW (ref 136–145)
Total Bilirubin: 0.24 mg/dL (ref 0.20–1.20)
Total Protein: 8.3 g/dL (ref 6.4–8.3)

## 2017-02-01 MED ORDER — HEPARIN SOD (PORK) LOCK FLUSH 100 UNIT/ML IV SOLN
500.0000 [IU] | Freq: Once | INTRAVENOUS | Status: DC | PRN
  Filled 2017-02-01: qty 5

## 2017-02-01 MED ORDER — DEXAMETHASONE SODIUM PHOSPHATE 10 MG/ML IJ SOLN
10.0000 mg | Freq: Once | INTRAMUSCULAR | Status: AC
Start: 2017-02-01 — End: 2017-02-01
  Administered 2017-02-01: 10 mg via INTRAVENOUS

## 2017-02-01 MED ORDER — DEXTROSE 5 % IV SOLN
Freq: Once | INTRAVENOUS | Status: AC
  Administered 2017-02-01: 12:00:00 via INTRAVENOUS

## 2017-02-01 MED ORDER — FLUOROURACIL CHEMO INJECTION 2.5 GM/50ML
400.0000 mg/m2 | Freq: Once | INTRAVENOUS | Status: AC
  Administered 2017-02-01: 700 mg via INTRAVENOUS
  Filled 2017-02-01: qty 14

## 2017-02-01 MED ORDER — SODIUM CHLORIDE 0.9% FLUSH
10.0000 mL | INTRAVENOUS | Status: DC | PRN
  Filled 2017-02-01: qty 10

## 2017-02-01 MED ORDER — SODIUM CHLORIDE 0.9 % IV SOLN
510.0000 mg | Freq: Once | INTRAVENOUS | Status: AC
  Administered 2017-02-01: 510 mg via INTRAVENOUS
  Filled 2017-02-01: qty 17

## 2017-02-01 MED ORDER — PALONOSETRON HCL INJECTION 0.25 MG/5ML
0.2500 mg | Freq: Once | INTRAVENOUS | Status: AC
  Administered 2017-02-01: 0.25 mg via INTRAVENOUS

## 2017-02-01 MED ORDER — OXALIPLATIN CHEMO INJECTION 100 MG/20ML
85.0000 mg/m2 | Freq: Once | INTRAVENOUS | Status: AC
  Administered 2017-02-01: 150 mg via INTRAVENOUS
  Filled 2017-02-01: qty 10

## 2017-02-01 MED ORDER — LEUCOVORIN CALCIUM INJECTION 350 MG
400.0000 mg/m2 | Freq: Once | INTRAVENOUS | Status: AC
  Administered 2017-02-01: 704 mg via INTRAVENOUS
  Filled 2017-02-01: qty 35.2

## 2017-02-01 MED ORDER — DEXAMETHASONE SODIUM PHOSPHATE 10 MG/ML IJ SOLN
INTRAMUSCULAR | Status: AC
  Filled 2017-02-01: qty 1

## 2017-02-01 MED ORDER — SODIUM CHLORIDE 0.9 % IV SOLN
Freq: Once | INTRAVENOUS | Status: AC
  Administered 2017-02-01: 11:00:00 via INTRAVENOUS

## 2017-02-01 MED ORDER — SODIUM CHLORIDE 0.9 % IV SOLN
2400.0000 mg/m2 | INTRAVENOUS | Status: DC
  Administered 2017-02-01: 4200 mg via INTRAVENOUS
  Filled 2017-02-01: qty 84

## 2017-02-01 MED ORDER — PALONOSETRON HCL INJECTION 0.25 MG/5ML
INTRAVENOUS | Status: AC
  Filled 2017-02-01: qty 5

## 2017-02-01 NOTE — Progress Notes (Signed)
56 year old male diagnosed with colon cancer with peritoneal metastases.  Past medical history includes microcytic anemia.  Medications include ferrous sulfate, Zofran, and Compazine.  Labs include albumin 2.7 on January 26.  Height: 5 feet 9 inches. Weight: 133.9 pounds. Usual body weight: 165 pounds in 2015.  140.8 pounds 01/11/2017.   BMI: 19.77.  Patient was diagnosed with severe malnutrition in the context of chronic illness while in hospital in January.  Patient has had continued weight loss and has severe depletion of both muscle mass and severe depletion of body fat.  Patient denies nausea, vomiting, diarrhea or constipation. He is drinking ensure and prefers vanilla.  Nutrition diagnosis:  Inadequate oral intake related to metastatic colon cancer as evidenced by 19% weight loss from usual body weight.  Intervention: Educated patient to consume small frequent, high-calorie, high-protein foods. Recommended patient continue Ensure Plus and  Provided one complementary case. Provided fact sheets on high-calorie, high-protein foods. Questions were answered.  Teach back method used.  Contact information given.  Monitoring, evaluation, goals:  Patient will tolerate increased calories and protein to minimize further weight loss.  Next visit: Tuesday, February 20, during infusion.  **Disclaimer: This note was dictated with voice recognition software. Similar sounding words can inadvertently be transcribed and this note may contain transcription errors which may not have been corrected upon publication of note.**

## 2017-02-01 NOTE — Progress Notes (Signed)
[employer]  Holiday representative met with patient and patients sister in the infusion room to offer support and follow up regarding transportation concerns.  CSW and patient reviewed and completed [employer] application.  CSW and patient discussed the [employer] application process.  CSW submitted application to [employer] office.  [employer] office will contact patient once the application is reviewed.  Patient did not express any additional needs at this time.  CSW encouraged patient to call with any additional questions or concerns.     Johnnye Lana, MSW, LCSW, OSW-C Clinical Social Worker [facility] [phone]

## 2017-02-01 NOTE — Progress Notes (Signed)
Blood return noted before and after 5FU push and before 5FU pump connection. Discharge instructions reviewed with patient and patient family. Patient verbalized understanding. Patient stable upon discharge.

## 2017-02-01 NOTE — Telephone Encounter (Signed)
Per 2/5 LOS and staff message I have scheduled appts and gave calendar. Notified the scheduler

## 2017-02-01 NOTE — Telephone Encounter (Signed)
Appointments scheduled per 2/5 LOS. Patient given AVS report and calendars with future scheduled appointments. °

## 2017-02-01 NOTE — Patient Instructions (Addendum)
[facility] Discharge Instructions for Patients Receiving Chemotherapy  Today you received the following chemotherapy agents: Oxaliplatin, Leucovorin and Fluorouracil   To help prevent nausea and vomiting after your treatment, we encourage you to take your nausea medication as directed.    If you develop nausea and vomiting that is not controlled by your nausea medication, call the clinic.   BELOW ARE SYMPTOMS THAT SHOULD BE REPORTED IMMEDIATELY:  *FEVER GREATER THAN 100.5 F  *CHILLS WITH OR WITHOUT FEVER  NAUSEA AND VOMITING THAT IS NOT CONTROLLED WITH YOUR NAUSEA MEDICATION  *UNUSUAL SHORTNESS OF BREATH  *UNUSUAL BRUISING OR BLEEDING  TENDERNESS IN MOUTH AND THROAT WITH OR WITHOUT PRESENCE OF ULCERS  *URINARY PROBLEMS  *BOWEL PROBLEMS  UNUSUAL RASH Items with * indicate a potential emergency and should be followed up as soon as possible.  Feel free to call the clinic you have any questions or concerns. The clinic phone number is [phone].  Please show the [facility] at check-in to the Emergency Department and triage nurse.  Oxaliplatin Injection What is this medicine? OXALIPLATIN (ox AL i PLA tin) is a chemotherapy drug. It targets fast dividing cells, like cancer cells, and causes these cells to die. This medicine is used to treat cancers of the colon and rectum, and many other cancers. This medicine may be used for other purposes; ask your health care provider or pharmacist if you have questions. COMMON BRAND NAME(S): Eloxatin What should I tell my health care provider before I take this medicine? They need to know if you have any of these conditions: -kidney disease -an unusual or allergic reaction to oxaliplatin, other chemotherapy, other medicines, foods, dyes, or preservatives -pregnant or trying to get pregnant -breast-feeding How should I use this medicine? This drug is given as an infusion into a vein. It is administered in a hospital  or clinic by a specially trained health care professional. Talk to your pediatrician regarding the use of this medicine in children. Special care may be needed. Overdosage: If you think you have taken too much of this medicine contact a poison control center or emergency room at once. NOTE: This medicine is only for you. Do not share this medicine with others. What if I miss a dose? It is important not to miss a dose. Call your doctor or health care professional if you are unable to keep an appointment. What may interact with this medicine? -medicines to increase blood counts like filgrastim, pegfilgrastim, sargramostim -probenecid -some antibiotics like amikacin, gentamicin, neomycin, polymyxin B, streptomycin, tobramycin -zalcitabine Talk to your doctor or health care professional before taking any of these medicines: -acetaminophen -aspirin -ibuprofen -ketoprofen -naproxen This list may not describe all possible interactions. Give your health care provider a list of all the medicines, herbs, non-prescription drugs, or dietary supplements you use. Also tell them if you smoke, drink alcohol, or use illegal drugs. Some items may interact with your medicine. What should I watch for while using this medicine? Your condition will be monitored carefully while you are receiving this medicine. You will need important blood work done while you are taking this medicine. This medicine can make you more sensitive to cold. Do not drink cold drinks or use ice. Cover exposed skin before coming in contact with cold temperatures or cold objects. When out in cold weather wear warm clothing and cover your mouth and nose to warm the air that goes into your lungs. Tell your doctor if you get sensitive to the  cold. This drug may make you feel generally unwell. This is not uncommon, as chemotherapy can affect healthy cells as well as cancer cells. Report any side effects. Continue your course of treatment even  though you feel ill unless your doctor tells you to stop. In some cases, you may be given additional medicines to help with side effects. Follow all directions for their use. Call your doctor or health care professional for advice if you get a fever, chills or sore throat, or other symptoms of a cold or flu. Do not treat yourself. This drug decreases your body's ability to fight infections. Try to avoid being around people who are sick. This medicine may increase your risk to bruise or bleed. Call your doctor or health care professional if you notice any unusual bleeding. Be careful brushing and flossing your teeth or using a toothpick because you may get an infection or bleed more easily. If you have any dental work done, tell your dentist you are receiving this medicine. Avoid taking products that contain aspirin, acetaminophen, ibuprofen, naproxen, or ketoprofen unless instructed by your doctor. These medicines may hide a fever. Do not become pregnant while taking this medicine. Women should inform their doctor if they wish to become pregnant or think they might be pregnant. There is a potential for serious side effects to an unborn child. Talk to your health care professional or pharmacist for more information. Do not breast-feed an infant while taking this medicine. Call your doctor or health care professional if you get diarrhea. Do not treat yourself. What side effects may I notice from receiving this medicine? Side effects that you should report to your doctor or health care professional as soon as possible: -allergic reactions like skin rash, itching or hives, swelling of the face, lips, or tongue -low blood counts - This drug may decrease the number of white blood cells, red blood cells and platelets. You may be at increased risk for infections and bleeding. -signs of infection - fever or chills, cough, sore throat, pain or difficulty passing urine -signs of decreased platelets or bleeding -  bruising, pinpoint red spots on the skin, black, tarry stools, nosebleeds -signs of decreased red blood cells - unusually weak or tired, fainting spells, lightheadedness -breathing problems -chest pain, pressure -cough -diarrhea -jaw tightness -mouth sores -nausea and vomiting -pain, swelling, redness or irritation at the injection site -pain, tingling, numbness in the hands or feet -problems with balance, talking, walking -redness, blistering, peeling or loosening of the skin, including inside the mouth -trouble passing urine or change in the amount of urine Side effects that usually do not require medical attention (report to your doctor or health care professional if they continue or are bothersome): -changes in vision -constipation -hair loss -loss of appetite -metallic taste in the mouth or changes in taste -stomach pain This list may not describe all possible side effects. Call your doctor for medical advice about side effects. You may report side effects to FDA at 1-800-FDA-1088. Where should I keep my medicine? This drug is given in a hospital or clinic and will not be stored at home. NOTE: This sheet is a summary. It may not cover all possible information. If you have questions about this medicine, talk to your doctor, pharmacist, or health care provider.  2017 Elsevier/Gold Standard (2008-07-10 17:22:47)  Leucovorin injection What is this medicine? LEUCOVORIN (loo koe VOR in) is used to prevent or treat the harmful effects of some medicines. This medicine is used to  treat anemia caused by a low amount of folic acid in the body. It is also used with 5-fluorouracil (5-FU) to treat colon cancer. This medicine may be used for other purposes; ask your health care provider or pharmacist if you have questions. What should I tell my health care provider before I take this medicine? They need to know if you have any of these conditions: -anemia from low levels of vitamin B-12 in  the blood -an unusual or allergic reaction to leucovorin, folic acid, other medicines, foods, dyes, or preservatives -pregnant or trying to get pregnant -breast-feeding How should I use this medicine? This medicine is for injection into a muscle or into a vein. It is given by a health care professional in a hospital or clinic setting. Talk to your pediatrician regarding the use of this medicine in children. Special care may be needed. Overdosage: If you think you have taken too much of this medicine contact a poison control center or emergency room at once. NOTE: This medicine is only for you. Do not share this medicine with others. What if I miss a dose? This does not apply. What may interact with this medicine? -capecitabine -fluorouracil -phenobarbital -phenytoin -primidone -trimethoprim-sulfamethoxazole This list may not describe all possible interactions. Give your health care provider a list of all the medicines, herbs, non-prescription drugs, or dietary supplements you use. Also tell them if you smoke, drink alcohol, or use illegal drugs. Some items may interact with your medicine. What should I watch for while using this medicine? Your condition will be monitored carefully while you are receiving this medicine. This medicine may increase the side effects of 5-fluorouracil, 5-FU. Tell your doctor or health care professional if you have diarrhea or mouth sores that do not get better or that get worse. What side effects may I notice from receiving this medicine? Side effects that you should report to your doctor or health care professional as soon as possible: -allergic reactions like skin rash, itching or hives, swelling of the face, lips, or tongue -breathing problems -fever, infection -mouth sores -unusual bleeding or bruising -unusually weak or tired Side effects that usually do not require medical attention (report to your doctor or health care professional if they continue or  are bothersome): -constipation or diarrhea -loss of appetite -nausea, vomiting This list may not describe all possible side effects. Call your doctor for medical advice about side effects. You may report side effects to FDA at 1-800-FDA-1088. Where should I keep my medicine? This drug is given in a hospital or clinic and will not be stored at home. NOTE: This sheet is a summary. It may not cover all possible information. If you have questions about this medicine, talk to your doctor, pharmacist, or health care provider.  2017 Elsevier/Gold Standard (2008-06-19 16:50:29)  Fluorouracil, 5-FU injection What is this medicine? FLUOROURACIL, 5-FU (flure oh YOOR a sil) is a chemotherapy drug. It slows the growth of cancer cells. This medicine is used to treat many types of cancer like breast cancer, colon or rectal cancer, pancreatic cancer, and stomach cancer. This medicine may be used for other purposes; ask your health care provider or pharmacist if you have questions. COMMON BRAND NAME(S): Adrucil What should I tell my health care provider before I take this medicine? They need to know if you have any of these conditions: -blood disorders -dihydropyrimidine dehydrogenase (DPD) deficiency -infection (especially a virus infection such as chickenpox, cold sores, or herpes) -kidney disease -liver disease -malnourished, poor nutrition -recent  or ongoing radiation therapy -an unusual or allergic reaction to fluorouracil, other chemotherapy, other medicines, foods, dyes, or preservatives -pregnant or trying to get pregnant -breast-feeding How should I use this medicine? This drug is given as an infusion or injection into a vein. It is administered in a hospital or clinic by a specially trained health care professional. Talk to your pediatrician regarding the use of this medicine in children. Special care may be needed. Overdosage: If you think you have taken too much of this medicine contact a  poison control center or emergency room at once. NOTE: This medicine is only for you. Do not share this medicine with others. What if I miss a dose? It is important not to miss your dose. Call your doctor or health care professional if you are unable to keep an appointment. What may interact with this medicine? -allopurinol -cimetidine -dapsone -digoxin -hydroxyurea -leucovorin -levamisole -medicines for seizures like ethotoin, fosphenytoin, phenytoin -medicines to increase blood counts like filgrastim, pegfilgrastim, sargramostim -medicines that treat or prevent blood clots like warfarin, enoxaparin, and dalteparin -methotrexate -metronidazole -pyrimethamine -some other chemotherapy drugs like busulfan, cisplatin, estramustine, vinblastine -trimethoprim -trimetrexate -vaccines Talk to your doctor or health care professional before taking any of these medicines: -acetaminophen -aspirin -ibuprofen -ketoprofen -naproxen This list may not describe all possible interactions. Give your health care provider a list of all the medicines, herbs, non-prescription drugs, or dietary supplements you use. Also tell them if you smoke, drink alcohol, or use illegal drugs. Some items may interact with your medicine. What should I watch for while using this medicine? Visit your doctor for checks on your progress. This drug may make you feel generally unwell. This is not uncommon, as chemotherapy can affect healthy cells as well as cancer cells. Report any side effects. Continue your course of treatment even though you feel ill unless your doctor tells you to stop. In some cases, you may be given additional medicines to help with side effects. Follow all directions for their use. Call your doctor or health care professional for advice if you get a fever, chills or sore throat, or other symptoms of a cold or flu. Do not treat yourself. This drug decreases your body's ability to fight infections. Try to  avoid being around people who are sick. This medicine may increase your risk to bruise or bleed. Call your doctor or health care professional if you notice any unusual bleeding. Be careful brushing and flossing your teeth or using a toothpick because you may get an infection or bleed more easily. If you have any dental work done, tell your dentist you are receiving this medicine. Avoid taking products that contain aspirin, acetaminophen, ibuprofen, naproxen, or ketoprofen unless instructed by your doctor. These medicines may hide a fever. Do not become pregnant while taking this medicine. Women should inform their doctor if they wish to become pregnant or think they might be pregnant. There is a potential for serious side effects to an unborn child. Talk to your health care professional or pharmacist for more information. Do not breast-feed an infant while taking this medicine. Men should inform their doctor if they wish to father a child. This medicine may lower sperm counts. Do not treat diarrhea with over the counter products. Contact your doctor if you have diarrhea that lasts more than 2 days or if it is severe and watery. This medicine can make you more sensitive to the sun. Keep out of the sun. If you cannot avoid being in the   sun, wear protective clothing and use sunscreen. Do not use sun lamps or tanning beds/booths. What side effects may I notice from receiving this medicine? Side effects that you should report to your doctor or health care professional as soon as possible: -allergic reactions like skin rash, itching or hives, swelling of the face, lips, or tongue -low blood counts - this medicine may decrease the number of white blood cells, red blood cells and platelets. You may be at increased risk for infections and bleeding. -signs of infection - fever or chills, cough, sore throat, pain or difficulty passing urine -signs of decreased platelets or bleeding - bruising, pinpoint red spots  on the skin, black, tarry stools, blood in the urine -signs of decreased red blood cells - unusually weak or tired, fainting spells, lightheadedness -breathing problems -changes in vision -chest pain -mouth sores -nausea and vomiting -pain, swelling, redness at site where injected -pain, tingling, numbness in the hands or feet -redness, swelling, or sores on hands or feet -stomach pain -unusual bleeding Side effects that usually do not require medical attention (report to your doctor or health care professional if they continue or are bothersome): -changes in finger or toe nails -diarrhea -dry or itchy skin -hair loss -headache -loss of appetite -sensitivity of eyes to the light -stomach upset -unusually teary eyes This list may not describe all possible side effects. Call your doctor for medical advice about side effects. You may report side effects to FDA at 1-800-FDA-1088. Where should I keep my medicine? This drug is given in a hospital or clinic and will not be stored at home. NOTE: This sheet is a summary. It may not cover all possible information. If you have questions about this medicine, talk to your doctor, pharmacist, or health care provider.  2017 Elsevier/Gold Standard (2008-04-18 13:53:16)   Ferumoxytol injection What is this medicine? FERUMOXYTOL is an iron complex. Iron is used to make healthy red blood cells, which carry oxygen and nutrients throughout the body. This medicine is used to treat iron deficiency anemia in people with chronic kidney disease. COMMON BRAND NAME(S): Feraheme What should I tell my health care provider before I take this medicine? They need to know if you have any of these conditions: -anemia not caused by low iron levels -high levels of iron in the blood -magnetic resonance imaging (MRI) test scheduled -an unusual or allergic reaction to iron, other medicines, foods, dyes, or preservatives -pregnant or trying to get  pregnant -breast-feeding How should I use this medicine? This medicine is for injection into a vein. It is given by a health care professional in a hospital or clinic setting. Talk to your pediatrician regarding the use of this medicine in children. Special care may be needed. What if I miss a dose? It is important not to miss your dose. Call your doctor or health care professional if you are unable to keep an appointment. What may interact with this medicine? This medicine may interact with the following medications: -other iron products What should I watch for while using this medicine? Visit your doctor or healthcare professional regularly. Tell your doctor or healthcare professional if your symptoms do not start to get better or if they get worse. You may need blood work done while you are taking this medicine. You may need to follow a special diet. Talk to your doctor. Foods that contain iron include: whole grains/cereals, dried fruits, beans, or peas, leafy green vegetables, and organ meats (liver, kidney). What side effects may  I notice from receiving this medicine? Side effects that you should report to your doctor or health care professional as soon as possible: -allergic reactions like skin rash, itching or hives, swelling of the face, lips, or tongue -breathing problems -changes in blood pressure -feeling faint or lightheaded, falls -fever or chills -flushing, sweating, or hot feelings -swelling of the ankles or feet Side effects that usually do not require medical attention (report to your doctor or health care professional if they continue or are bothersome): -diarrhea -headache -nausea, vomiting -stomach pain Where should I keep my medicine? This drug is given in a hospital or clinic and will not be stored at home.  2017 Elsevier/Gold Standard (2016-01-16 12:41:49)

## 2017-02-03 ENCOUNTER — Ambulatory Visit (HOSPITAL_BASED_OUTPATIENT_CLINIC_OR_DEPARTMENT_OTHER): Payer: Medicaid Other

## 2017-02-03 VITALS — BP 117/86 | HR 77 | Temp 98.5°F | Resp 20

## 2017-02-03 DIAGNOSIS — C186 Malignant neoplasm of descending colon: Secondary | ICD-10-CM | POA: Diagnosis not present

## 2017-02-03 DIAGNOSIS — Z452 Encounter for adjustment and management of vascular access device: Secondary | ICD-10-CM | POA: Diagnosis present

## 2017-02-03 MED ORDER — SODIUM CHLORIDE 0.9% FLUSH
10.0000 mL | INTRAVENOUS | Status: DC | PRN
  Administered 2017-02-03: 10 mL
  Filled 2017-02-03: qty 10

## 2017-02-03 MED ORDER — HEPARIN SOD (PORK) LOCK FLUSH 100 UNIT/ML IV SOLN
500.0000 [IU] | Freq: Once | INTRAVENOUS | Status: AC | PRN
  Administered 2017-02-03: 500 [IU]
  Filled 2017-02-03: qty 5

## 2017-02-03 NOTE — Patient Instructions (Signed)

## 2017-02-08 ENCOUNTER — Ambulatory Visit (HOSPITAL_BASED_OUTPATIENT_CLINIC_OR_DEPARTMENT_OTHER): Payer: Medicaid Other

## 2017-02-08 VITALS — BP 133/83 | HR 68 | Temp 98.1°F | Resp 18

## 2017-02-08 DIAGNOSIS — C186 Malignant neoplasm of descending colon: Secondary | ICD-10-CM | POA: Diagnosis not present

## 2017-02-08 DIAGNOSIS — D500 Iron deficiency anemia secondary to blood loss (chronic): Secondary | ICD-10-CM | POA: Diagnosis present

## 2017-02-08 DIAGNOSIS — C786 Secondary malignant neoplasm of retroperitoneum and peritoneum: Secondary | ICD-10-CM

## 2017-02-08 MED ORDER — HEPARIN SOD (PORK) LOCK FLUSH 100 UNIT/ML IV SOLN
500.0000 [IU] | Freq: Once | INTRAVENOUS | Status: AC | PRN
  Administered 2017-02-08: 500 [IU]
  Filled 2017-02-08: qty 5

## 2017-02-08 MED ORDER — SODIUM CHLORIDE 0.9 % IV SOLN
Freq: Once | INTRAVENOUS | Status: AC
  Administered 2017-02-08: 12:00:00 via INTRAVENOUS

## 2017-02-08 MED ORDER — HEPARIN SOD (PORK) LOCK FLUSH 100 UNIT/ML IV SOLN
250.0000 [IU] | Freq: Once | INTRAVENOUS | Status: AC | PRN
  Filled 2017-02-08: qty 5

## 2017-02-08 MED ORDER — SODIUM CHLORIDE 0.9 % IV SOLN
510.0000 mg | Freq: Once | INTRAVENOUS | Status: AC
  Administered 2017-02-08: 510 mg via INTRAVENOUS
  Filled 2017-02-08: qty 17

## 2017-02-08 MED ORDER — SODIUM CHLORIDE 0.9% FLUSH
10.0000 mL | INTRAVENOUS | Status: DC | PRN
  Administered 2017-02-08: 10 mL
  Filled 2017-02-08: qty 10

## 2017-02-08 NOTE — Patient Instructions (Signed)
Ferumoxytol injection What is this medicine? FERUMOXYTOL is an iron complex. Iron is used to make healthy red blood cells, which carry oxygen and nutrients throughout the body. This medicine is used to treat iron deficiency anemia in people with chronic kidney disease. COMMON BRAND NAME(S): Feraheme What should I tell my health care provider before I take this medicine? They need to know if you have any of these conditions: -anemia not caused by low iron levels -high levels of iron in the blood -magnetic resonance imaging (MRI) test scheduled -an unusual or allergic reaction to iron, other medicines, foods, dyes, or preservatives -pregnant or trying to get pregnant -breast-feeding How should I use this medicine? This medicine is for injection into a vein. It is given by a health care professional in a hospital or clinic setting. Talk to your pediatrician regarding the use of this medicine in children. Special care may be needed. What if I miss a dose? It is important not to miss your dose. Call your doctor or health care professional if you are unable to keep an appointment. What may interact with this medicine? This medicine may interact with the following medications: -other iron products What should I watch for while using this medicine? Visit your doctor or healthcare professional regularly. Tell your doctor or healthcare professional if your symptoms do not start to get better or if they get worse. You may need blood work done while you are taking this medicine. You may need to follow a special diet. Talk to your doctor. Foods that contain iron include: whole grains/cereals, dried fruits, beans, or peas, leafy green vegetables, and organ meats (liver, kidney). What side effects may I notice from receiving this medicine? Side effects that you should report to your doctor or health care professional as soon as possible: -allergic reactions like skin rash, itching or hives, swelling of the  face, lips, or tongue -breathing problems -changes in blood pressure -feeling faint or lightheaded, falls -fever or chills -flushing, sweating, or hot feelings -swelling of the ankles or feet Side effects that usually do not require medical attention (report to your doctor or health care professional if they continue or are bothersome): -diarrhea -headache -nausea, vomiting -stomach pain Where should I keep my medicine? This drug is given in a hospital or clinic and will not be stored at home.  2017 Elsevier/Gold Standard (2016-01-16 12:41:49)  

## 2017-02-09 ENCOUNTER — Telehealth: Payer: Self-pay | Admitting: *Deleted

## 2017-02-09 MED ORDER — OXYCODONE-ACETAMINOPHEN 5-325 MG PO TABS: 1.0000 | ORAL_TABLET | ORAL | 0 refills | Status: DC | PRN

## 2017-02-09 NOTE — Telephone Encounter (Signed)
Received vm call from pt asking for refill on his pain med.  Script will be signed & pt will be notified that it is ready.

## 2017-02-10 MED FILL — OXYCODONE W/APAP 5/325 TAB: 5-325 | 7 days supply | Qty: 45 | Fill #0

## 2017-02-12 ENCOUNTER — Encounter: Payer: Self-pay | Admitting: *Deleted
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
## 2017-02-16 ENCOUNTER — Other Ambulatory Visit: Payer: Self-pay | Admitting: *Deleted

## 2017-02-16 ENCOUNTER — Ambulatory Visit (HOSPITAL_BASED_OUTPATIENT_CLINIC_OR_DEPARTMENT_OTHER): Payer: Medicaid Other

## 2017-02-16 ENCOUNTER — Telehealth: Payer: Self-pay | Admitting: Hematology

## 2017-02-16 ENCOUNTER — Ambulatory Visit (HOSPITAL_BASED_OUTPATIENT_CLINIC_OR_DEPARTMENT_OTHER): Payer: Medicaid Other | Admitting: Hematology

## 2017-02-16 ENCOUNTER — Other Ambulatory Visit (HOSPITAL_BASED_OUTPATIENT_CLINIC_OR_DEPARTMENT_OTHER): Payer: Medicaid Other

## 2017-02-16 ENCOUNTER — Ambulatory Visit: Payer: Self-pay | Admitting: Nutrition

## 2017-02-16 ENCOUNTER — Encounter: Payer: Self-pay | Admitting: *Deleted

## 2017-02-16 VITALS — BP 111/78 | HR 78 | Temp 98.3°F | Resp 18

## 2017-02-16 DIAGNOSIS — Z452 Encounter for adjustment and management of vascular access device: Secondary | ICD-10-CM | POA: Diagnosis present

## 2017-02-16 DIAGNOSIS — E46 Unspecified protein-calorie malnutrition: Secondary | ICD-10-CM

## 2017-02-16 DIAGNOSIS — D630 Anemia in neoplastic disease: Secondary | ICD-10-CM

## 2017-02-16 DIAGNOSIS — C786 Secondary malignant neoplasm of retroperitoneum and peritoneum: Secondary | ICD-10-CM

## 2017-02-16 DIAGNOSIS — D500 Iron deficiency anemia secondary to blood loss (chronic): Secondary | ICD-10-CM

## 2017-02-16 DIAGNOSIS — Z5111 Encounter for antineoplastic chemotherapy: Secondary | ICD-10-CM | POA: Diagnosis present

## 2017-02-16 DIAGNOSIS — C186 Malignant neoplasm of descending colon: Secondary | ICD-10-CM

## 2017-02-16 DIAGNOSIS — C762 Malignant neoplasm of abdomen: Secondary | ICD-10-CM

## 2017-02-16 DIAGNOSIS — R634 Abnormal weight loss: Secondary | ICD-10-CM | POA: Diagnosis not present

## 2017-02-16 LAB — COMPREHENSIVE METABOLIC PANEL
ALT: <6 U/L (ref 0–55)
AST: 14 U/L (ref 5–34)
Albumin: 2.7 g/dL — ABNORMAL LOW (ref 3.5–5.0)
Alkaline Phosphatase: 85 U/L (ref 40–150)
Anion Gap: 9 mEq/L (ref 3–11)
BILIRUBIN TOTAL: 0.25 mg/dL (ref 0.20–1.20)
BUN: 10.4 mg/dL (ref 7.0–26.0)
CO2: 23 meq/L (ref 22–29)
Calcium: 8.9 mg/dL (ref 8.4–10.4)
Chloride: 105 mEq/L (ref 98–109)
Creatinine: 0.7 mg/dL (ref 0.7–1.3)
GLUCOSE: 100 mg/dL (ref 70–140)
Potassium: 3.9 mEq/L (ref 3.5–5.1)
SODIUM: 137 meq/L (ref 136–145)
Total Protein: 7.7 g/dL (ref 6.4–8.3)

## 2017-02-16 LAB — CBC WITH DIFFERENTIAL/PLATELET
BASO%: 0.7 % (ref 0.0–2.0)
Basophils Absolute: 0 10*3/uL (ref 0.0–0.1)
EOS ABS: 0.1 10*3/uL (ref 0.0–0.5)
EOS%: 3.2 % (ref 0.0–7.0)
HCT: 25.6 % — ABNORMAL LOW (ref 38.4–49.9)
HGB: 8 g/dL — ABNORMAL LOW (ref 13.0–17.1)
LYMPH%: 33.5 % (ref 14.0–49.0)
MCH: 22.5 pg — ABNORMAL LOW (ref 27.2–33.4)
MCHC: 31.3 g/dL — ABNORMAL LOW (ref 32.0–36.0)
MCV: 71.9 fL — AB (ref 79.3–98.0)
MONO#: 0.8 10*3/uL (ref 0.1–0.9)
MONO%: 17.3 % — AB (ref 0.0–14.0)
NEUT%: 45.3 % (ref 39.0–75.0)
NEUTROS ABS: 2 10*3/uL (ref 1.5–6.5)
Platelets: 140 10*3/uL (ref 140–400)
RBC: 3.56 10*6/uL — AB (ref 4.20–5.82)
RDW: 21.8 % — ABNORMAL HIGH (ref 11.0–14.6)
WBC: 4.3 10*3/uL (ref 4.0–10.3)
lymph#: 1.5 10*3/uL (ref 0.9–3.3)

## 2017-02-16 LAB — FERRITIN: Ferritin: 1213 ng/ml — ABNORMAL HIGH (ref 22–316)

## 2017-02-16 LAB — CEA (IN HOUSE-CHCC): CEA (CHCC-In House): 21.24 ng/mL — ABNORMAL HIGH (ref 0.00–5.00)

## 2017-02-16 MED ORDER — DEXAMETHASONE SODIUM PHOSPHATE 10 MG/ML IJ SOLN
10.0000 mg | Freq: Once | INTRAMUSCULAR | Status: AC
  Administered 2017-02-16: 10 mg via INTRAVENOUS

## 2017-02-16 MED ORDER — PALONOSETRON HCL INJECTION 0.25 MG/5ML
0.2500 mg | Freq: Once | INTRAVENOUS | Status: AC
  Administered 2017-02-16: 0.25 mg via INTRAVENOUS

## 2017-02-16 MED ORDER — DEXAMETHASONE SODIUM PHOSPHATE 10 MG/ML IJ SOLN
INTRAMUSCULAR | Status: AC
  Filled 2017-02-16: qty 1

## 2017-02-16 MED ORDER — PALONOSETRON HCL INJECTION 0.25 MG/5ML
INTRAVENOUS | Status: AC
  Filled 2017-02-16: qty 5

## 2017-02-16 MED ORDER — SODIUM CHLORIDE 0.9% FLUSH
10.0000 mL | INTRAVENOUS | Status: DC | PRN
  Filled 2017-02-16: qty 10

## 2017-02-16 MED ORDER — HEPARIN SOD (PORK) LOCK FLUSH 100 UNIT/ML IV SOLN
500.0000 [IU] | Freq: Once | INTRAVENOUS | Status: DC | PRN
Start: 2017-02-16 — End: 2017-02-16
  Filled 2017-02-16: qty 5

## 2017-02-16 MED ORDER — OXALIPLATIN CHEMO INJECTION 100 MG/20ML
85.0000 mg/m2 | Freq: Once | INTRAVENOUS | Status: AC
  Administered 2017-02-16: 150 mg via INTRAVENOUS
  Filled 2017-02-16: qty 20

## 2017-02-16 MED ORDER — FLUOROURACIL CHEMO INJECTION 2.5 GM/50ML
400.0000 mg/m2 | Freq: Once | INTRAVENOUS | Status: AC
  Administered 2017-02-16: 700 mg via INTRAVENOUS
  Filled 2017-02-16: qty 14

## 2017-02-16 MED ORDER — LEUCOVORIN CALCIUM INJECTION 350 MG
398.0000 mg/m2 | Freq: Once | INTRAVENOUS | Status: AC
  Administered 2017-02-16: 700 mg via INTRAVENOUS
  Filled 2017-02-16: qty 35

## 2017-02-16 MED ORDER — SODIUM CHLORIDE 0.9% FLUSH
10.0000 mL | INTRAVENOUS | Status: DC | PRN
  Administered 2017-02-16: 10 mL
  Filled 2017-02-16: qty 10

## 2017-02-16 MED ORDER — DEXTROSE 5 % IV SOLN
Freq: Once | INTRAVENOUS | Status: AC
  Administered 2017-02-16: 13:00:00 via INTRAVENOUS

## 2017-02-16 MED ORDER — SODIUM CHLORIDE 0.9 % IV SOLN
2400.0000 mg/m2 | INTRAVENOUS | Status: DC
  Administered 2017-02-16: 4200 mg via INTRAVENOUS
  Filled 2017-02-16: qty 84

## 2017-02-16 NOTE — Patient Instructions (Signed)

## 2017-02-16 NOTE — Progress Notes (Signed)
Jeff [name]  Holiday representative met with patient and patients daughter in the infusion room at [facility].  CSW provided patient with letter he requested.  CSW, patient, and daughter discussed status of SSD/SSI application.  CSW confirmed with [facility] that patients application was still pending for SSI approval.  Patient is currently using "public assistance transportation" through social services per patients daughter.  CSW encouraged patient to still complete assessment with [facility] so he would had additional resources for transportation.  CSW team will continue to follow and support as needed.    Johnnye Lana, MSW, LCSW, OSW-C Clinical Social Worker [facility] [phone]

## 2017-02-16 NOTE — Patient Instructions (Signed)
[facility] Discharge Instructions for Patients Receiving Chemotherapy  Today you received the following chemotherapy agents: Oxaliplatin, Leucovorin and Adrucil.   To help prevent nausea and vomiting after your treatment, we encourage you to take your nausea medication as directed.  If you develop nausea and vomiting that is not controlled by your nausea medication, call the clinic.   BELOW ARE SYMPTOMS THAT SHOULD BE REPORTED IMMEDIATELY:  *FEVER GREATER THAN 100.5 F  *CHILLS WITH OR WITHOUT FEVER  NAUSEA AND VOMITING THAT IS NOT CONTROLLED WITH YOUR NAUSEA MEDICATION  *UNUSUAL SHORTNESS OF BREATH  *UNUSUAL BRUISING OR BLEEDING  TENDERNESS IN MOUTH AND THROAT WITH OR WITHOUT PRESENCE OF ULCERS  *URINARY PROBLEMS  *BOWEL PROBLEMS  UNUSUAL RASH Items with * indicate a potential emergency and should be followed up as soon as possible.  Feel free to call the clinic you have any questions or concerns. The clinic phone number is [phone].  Please show the CHEMO ALERT CARD at check-in to the Emergency Department and triage nurse.   

## 2017-02-16 NOTE — Progress Notes (Signed)
Nutrition follow-up completed with patient receiving chemotherapy for colon cancer. No recent weight taken. Patient reports he has a good appetite and is eating well. Is drinking at least one Ensure Plus a day; states he has plenty at home. Denies nutrition impact symptoms.  Nutrition diagnosis: Inadequate oral intake has improved.  Intervention: Educated patient to continue strategies for increased calories and protein. Encouraged him to continue Ensure Plus at least once daily. Teach back method used.  Monitoring, evaluation, goals: Patient will tolerate increased calories and protein to promote weight maintenance.  Next visit: This will be scheduled as needed.  **Disclaimer: This note was dictated with voice recognition software. Similar sounding words can inadvertently be transcribed and this note may contain transcription errors which may not have been corrected upon publication of note.**

## 2017-02-16 NOTE — Telephone Encounter (Signed)
Appointments scheduled per 2/20 LOS. Patient given AVS report and calendars with future scheduled appointments. °

## 2017-02-16 NOTE — Progress Notes (Signed)
Okay to treat with hgb of 8 per Janifer Adie, RN per [name].

## 2017-02-17 ENCOUNTER — Ambulatory Visit (HOSPITAL_COMMUNITY)
Admission: RE | Admit: 2017-02-17 | Discharge: 2017-02-17 | Disposition: A | Discharge status: Home / Self Care | Payer: Medicaid Other | Source: Ambulatory Visit | Attending: Family Medicine | Admitting: Family Medicine

## 2017-02-17 ENCOUNTER — Telehealth: Payer: Self-pay | Admitting: *Deleted

## 2017-02-17 ENCOUNTER — Ambulatory Visit (HOSPITAL_BASED_OUTPATIENT_CLINIC_OR_DEPARTMENT_OTHER): Payer: Medicaid Other | Admitting: Hematology

## 2017-02-17 DIAGNOSIS — I82411 Acute embolism and thrombosis of right femoral vein: Secondary | ICD-10-CM

## 2017-02-17 DIAGNOSIS — C186 Malignant neoplasm of descending colon: Secondary | ICD-10-CM

## 2017-02-17 DIAGNOSIS — I82431 Acute embolism and thrombosis of right popliteal vein: Secondary | ICD-10-CM | POA: Diagnosis not present

## 2017-02-17 MED ORDER — RIVAROXABAN (XARELTO) VTE STARTER PACK (15 & 20 MG): ORAL_TABLET | ORAL | 0 refills | Status: DC

## 2017-02-17 MED FILL — XARELTO STARTER PACK: 15 & 20 | 28 days supply | Qty: 51 | Fill #0

## 2017-02-17 NOTE — Progress Notes (Addendum)
**  Preliminary report by tech**  Bilateral lower extremity venous duplex complete. There is evidence of deep vein thrombosis involving the femoral, popliteal, and peroneal veins of the right lower extremity.  There is no evidence of superficial vein thrombosis involving the right lower extremity. There is evidence of superficial vein thrombosis involving the lesser saphenous vein of the left lower extremity. There is no evidence of deep vein thrombosis involving the left lower extremity. There is no evidence of a Baker's cyst bilaterally. Results were given to Dr. Ernestina Penna nurse, Janifer Adie.  02/17/17 3:57 PM Jeff [name] RVT

## 2017-02-17 NOTE — Telephone Encounter (Signed)
Received call from Greg/Vasc/US [facility] reporting pt has DVT RLE & L superficial vein thrombus.  Informed to send pt to office.
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
## 2017-02-18 ENCOUNTER — Ambulatory Visit (HOSPITAL_BASED_OUTPATIENT_CLINIC_OR_DEPARTMENT_OTHER): Payer: Medicaid Other

## 2017-02-18 ENCOUNTER — Encounter: Payer: Self-pay | Admitting: Hematology

## 2017-02-18 VITALS — BP 108/74 | HR 91 | Temp 99.0°F | Resp 19

## 2017-02-18 DIAGNOSIS — Z452 Encounter for adjustment and management of vascular access device: Secondary | ICD-10-CM | POA: Diagnosis present

## 2017-02-18 DIAGNOSIS — Z86718 Personal history of other venous thrombosis and embolism: Secondary | ICD-10-CM | POA: Insufficient documentation

## 2017-02-18 DIAGNOSIS — C186 Malignant neoplasm of descending colon: Secondary | ICD-10-CM | POA: Diagnosis not present

## 2017-02-18 DIAGNOSIS — I82401 Acute embolism and thrombosis of unspecified deep veins of right lower extremity: Secondary | ICD-10-CM

## 2017-02-18 HISTORY — DX: Acute embolism and thrombosis of unspecified deep veins of right lower extremity: I82.401

## 2017-02-18 MED ORDER — SODIUM CHLORIDE 0.9% FLUSH
10.0000 mL | INTRAVENOUS | Status: DC | PRN
  Administered 2017-02-18: 10 mL
  Filled 2017-02-18: qty 10

## 2017-02-18 MED ORDER — HEPARIN SOD (PORK) LOCK FLUSH 100 UNIT/ML IV SOLN
500.0000 [IU] | Freq: Once | INTRAVENOUS | Status: AC | PRN
  Administered 2017-02-18: 500 [IU]
  Filled 2017-02-18: qty 5

## 2017-02-18 NOTE — Patient Instructions (Signed)

## 2017-02-19 ENCOUNTER — Encounter: Payer: Self-pay | Admitting: *Deleted

## 2017-02-19 NOTE — Progress Notes (Signed)
[place] Work  Clinical Social Work met with pt and his daughter at [employer] to check in and reassess needs.  Daughter and pt had just returned from court where they had found out pt has 10 days to get out of his apartment unless he can pay for his rent. Pt very discouraged and appeared hopeless today. Daughter tearful. Both are discouraged now that pt has DVT. CSW and daughter researching alternative housing arrangements. Pt has SSI pending. Pt has limited local supports and daughter resides with pt's ex-wife, whom is remarried. No other family to reside with currently. CSW and pt to revisit situation next week and CSW awaits return calls from housing resources.   Clinical Social Work interventions:  Supportive Psychiatric nurse education and referral  Loren Racer, LCSW, OSW-C Clinical Social Worker [place]  [place] Phone: [phone] Fax: [phone]

## 2017-02-20 ENCOUNTER — Encounter: Payer: Self-pay | Admitting: Hematology

## 2017-02-20 DIAGNOSIS — Z7189 Other specified counseling: Secondary | ICD-10-CM | POA: Insufficient documentation

## 2017-02-20 NOTE — Progress Notes (Signed)
ON PATHWAY REGIMEN - Colorectal  No Change  Continue With Treatment as Ordered.     A cycle is every 14 days:     Oxaliplatin        Dose Mod: None     Leucovorin        Dose Mod: None     5-Fluorouracil        Dose Mod: None     5-Fluorouracil        Dose Mod: None     Bevacizumab        Dose Mod: None  **Always confirm dose/schedule in your pharmacy ordering system**    Patient Characteristics: Metastatic Colorectal, First Line, Nonsurgical Candidate, KRAS Mutation Positive/Unknown, BRAF Wild-Type/Unknown, PS = 0,1; Bevacizumab Eligible Current evidence of distant metastases? Yes AJCC T Category: TX AJCC N Category: NX AJCC M Category: Staged < 8th Ed. AJCC 8 Stage Grouping: IVC BRAF Mutation Status: Awaiting Test Results KRAS/NRAS Mutation Status: Awaiting Test Results Line of therapy: First Line Would you be surprised if this patient died  in the next year? I would be surprised if this patient died in the next year Performance Status: PS = 0, 1 Bevacizumab Eligibility: Eligible  Intent of Therapy: Non-Curative / Palliative Intent, Discussed with Patient

## 2017-02-20 NOTE — Progress Notes (Signed)
START ON PATHWAY REGIMEN - Colorectal     A cycle is every 14 days:     Panitumumab        Dose Mod: None     Oxaliplatin        Dose Mod: None     Leucovorin        Dose Mod: None     5-Fluorouracil        Dose Mod: None     5-Fluorouracil        Dose Mod: None  **Always confirm dose/schedule in your pharmacy ordering system**    Patient Characteristics: Metastatic Colorectal, First Line, Nonsurgical Candidate, KRAS/NRAS Wild-Type, BRAF Wild-Type/Unknown, PS = 0,1; Bevacizumab Eligible Current evidence of distant metastases? Yes AJCC T Category: TX AJCC N Category: NX AJCC M Category: Staged < 8th Ed. AJCC 8 Stage Grouping: IVC BRAF Mutation Status: Wild Type (no mutation) KRAS/NRAS Mutation Status: Wild Type (no mutation) Line of therapy: First Line Would you be surprised if this patient died  in the next year? I would be surprised if this patient died in the next year Performance Status: PS = 0, 1 Bevacizumab Eligibility: Eligible  Intent of Therapy: Non-Curative / Palliative Intent, Discussed with Patient

## 2017-02-22 ENCOUNTER — Telehealth: Payer: Self-pay | Admitting: *Deleted

## 2017-02-22 ENCOUNTER — Other Ambulatory Visit: Payer: Self-pay | Admitting: *Deleted

## 2017-02-22 DIAGNOSIS — C186 Malignant neoplasm of descending colon: Secondary | ICD-10-CM

## 2017-02-22 DIAGNOSIS — D509 Iron deficiency anemia, unspecified: Secondary | ICD-10-CM

## 2017-02-22 MED ORDER — OXYCODONE HCL 5 MG PO TABS: 10.0000 mg | ORAL_TABLET | ORAL | 0 refills | Status: DC | PRN

## 2017-02-22 MED ORDER — FERROUS SULFATE 325 (65 FE) MG PO TABS: 325.0000 mg | ORAL_TABLET | Freq: Two times a day (BID) | ORAL | 0 refills | Status: DC

## 2017-02-22 MED FILL — ONDANSETRON HCL 8 MG TABLET: 8 | 15 days supply | Qty: 30 | Fill #1

## 2017-02-22 MED FILL — FERROUS SULFATE 325 MG TAB: 325 (65 FE) | 30 days supply | Qty: 60 | Fill #0

## 2017-02-22 MED FILL — PROCHLORPERAZINE 10 MG TAB: 10 | 7 days supply | Qty: 30 | Fill #1

## 2017-02-22 NOTE — Telephone Encounter (Signed)
"  I'm running out of all my medicines and need refills.  I am out of iron, medicines for my stomach pain medicines.  Do I need to take these bottles to the Pharmacy or what?  I dialed this number to reach the doctor or his nurse but got you."   Narcotic prescriptions will need to be written by the doctor, picked up from the office and taken to the Pharmacy to be filled.  Call transferred per patient's request.

## 2017-02-22 NOTE — Telephone Encounter (Signed)
Spoke with pt and was informed that pt needed refill of regular Oxycodone - without Tylenol.  Stated pt had informed [name] at last office visit that Percocet is not helping with pain - had been taking  2 tabs every 4 hours without relief.. Pt also requested refill of ferrous sulfate - script was given when pt was discharged from the hospital. [name] notified. Pt's   Phone    [phone].

## 2017-02-23 MED FILL — oxyCODONE HCL 5 MG TABS: 5 | 5 days supply | Qty: 60 | Fill #0

## 2017-02-24 ENCOUNTER — Encounter: Payer: Self-pay | Admitting: *Deleted

## 2017-02-24 ENCOUNTER — Encounter: Payer: Self-pay | Admitting: Hematology

## 2017-02-24 NOTE — Progress Notes (Signed)
Received email from [email or web address]  regarding new medication Vectibix. Completed application for drug replacement through [place]. Patient will just need to sign application and will fax to [place].

## 2017-02-24 NOTE — Progress Notes (Signed)
[place] Work  Clinical Social Work continues to follow and assisting with housing concerns. Referral made to [facility] program for additional assistance. Pt agreed to referral. CSW awaits update from program after they review application. CSW team to follow closely.   Clinical Social Work interventions:  Resource education and referral Pt advocacy  Loren Racer, LCSW, OSW-C Clinical Social Worker [place]  [place] Phone: [phone] Fax: [phone]

## 2017-02-26 ENCOUNTER — Ambulatory Visit: Payer: Self-pay | Admitting: Hematology

## 2017-02-26 ENCOUNTER — Encounter: Payer: Self-pay | Admitting: *Deleted

## 2017-02-26 NOTE — Progress Notes (Signed)
[place] Work  Clinical Social Work met with pt to complete [employer] paperwork that provides temporary housing for pt until March 26, 2017. Pt completed paperwork as needed and agreed to program. Pt will reside at [facility] for four weeks until additional housing is secured. Daughter aware of plan and to begin application process through [employer] and other housing resources. Pt reports to have Ensure and other needed food items. CSW team to follow closely.   Clinical Social Work interventions: Resource assistance Pt advocacy  Loren Racer, LCSW, OSW-C Clinical Social Worker [place]  [place] Phone: [phone] Fax: [phone]
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
## 2017-03-02 ENCOUNTER — Ambulatory Visit (HOSPITAL_BASED_OUTPATIENT_CLINIC_OR_DEPARTMENT_OTHER): Payer: Medicaid Other

## 2017-03-02 ENCOUNTER — Ambulatory Visit (HOSPITAL_BASED_OUTPATIENT_CLINIC_OR_DEPARTMENT_OTHER): Payer: Medicaid Other | Admitting: Hematology

## 2017-03-02 ENCOUNTER — Encounter: Payer: Self-pay | Admitting: Hematology

## 2017-03-02 VITALS — BP 108/53 | HR 96 | Temp 98.7°F | Resp 18 | Ht 69.0 in | Wt 134.8 lb

## 2017-03-02 DIAGNOSIS — Z5111 Encounter for antineoplastic chemotherapy: Secondary | ICD-10-CM

## 2017-03-02 DIAGNOSIS — C786 Secondary malignant neoplasm of retroperitoneum and peritoneum: Secondary | ICD-10-CM | POA: Diagnosis not present

## 2017-03-02 DIAGNOSIS — R634 Abnormal weight loss: Secondary | ICD-10-CM

## 2017-03-02 DIAGNOSIS — C762 Malignant neoplasm of abdomen: Secondary | ICD-10-CM

## 2017-03-02 DIAGNOSIS — R197 Diarrhea, unspecified: Secondary | ICD-10-CM | POA: Diagnosis not present

## 2017-03-02 DIAGNOSIS — I82411 Acute embolism and thrombosis of right femoral vein: Secondary | ICD-10-CM | POA: Diagnosis not present

## 2017-03-02 DIAGNOSIS — D500 Iron deficiency anemia secondary to blood loss (chronic): Secondary | ICD-10-CM

## 2017-03-02 DIAGNOSIS — C186 Malignant neoplasm of descending colon: Secondary | ICD-10-CM

## 2017-03-02 DIAGNOSIS — D630 Anemia in neoplastic disease: Secondary | ICD-10-CM | POA: Diagnosis not present

## 2017-03-02 DIAGNOSIS — Z452 Encounter for adjustment and management of vascular access device: Secondary | ICD-10-CM | POA: Diagnosis present

## 2017-03-02 DIAGNOSIS — Z95828 Presence of other vascular implants and grafts: Secondary | ICD-10-CM | POA: Insufficient documentation

## 2017-03-02 LAB — CBC WITH DIFFERENTIAL/PLATELET
BASO%: 1.4 % (ref 0.0–2.0)
BASOS ABS: 0.1 10*3/uL (ref 0.0–0.1)
EOS ABS: 0.1 10*3/uL (ref 0.0–0.5)
EOS%: 1.7 % (ref 0.0–7.0)
HEMATOCRIT: 29.5 % — AB (ref 38.4–49.9)
HEMOGLOBIN: 9.2 g/dL — AB (ref 13.0–17.1)
LYMPH#: 1.6 10*3/uL (ref 0.9–3.3)
LYMPH%: 27.9 % (ref 14.0–49.0)
MCH: 23.7 pg — AB (ref 27.2–33.4)
MCHC: 31.4 g/dL — ABNORMAL LOW (ref 32.0–36.0)
MCV: 75.7 fL — AB (ref 79.3–98.0)
MONO#: 0.7 10*3/uL (ref 0.1–0.9)
MONO%: 11.7 % (ref 0.0–14.0)
NEUT#: 3.3 10*3/uL (ref 1.5–6.5)
NEUT%: 57.3 % (ref 39.0–75.0)
Platelets: 201 10*3/uL (ref 140–400)
RBC: 3.89 10*6/uL — ABNORMAL LOW (ref 4.20–5.82)
RDW: 27.2 % — AB (ref 11.0–14.6)
WBC: 5.8 10*3/uL (ref 4.0–10.3)

## 2017-03-02 LAB — COMPREHENSIVE METABOLIC PANEL
ALT: <6 U/L (ref 0–55)
AST: 17 U/L (ref 5–34)
Albumin: 3.1 g/dL — ABNORMAL LOW (ref 3.5–5.0)
Alkaline Phosphatase: 91 U/L (ref 40–150)
Anion Gap: 8 mEq/L (ref 3–11)
BILIRUBIN TOTAL: 0.24 mg/dL (ref 0.20–1.20)
BUN: 13.5 mg/dL (ref 7.0–26.0)
CO2: 22 meq/L (ref 22–29)
Calcium: 8.8 mg/dL (ref 8.4–10.4)
Chloride: 108 mEq/L (ref 98–109)
Creatinine: 0.9 mg/dL (ref 0.7–1.3)
GLUCOSE: 85 mg/dL (ref 70–140)
Potassium: 4.1 mEq/L (ref 3.5–5.1)
SODIUM: 138 meq/L (ref 136–145)
TOTAL PROTEIN: 7.9 g/dL (ref 6.4–8.3)

## 2017-03-02 MED ORDER — FLUOROURACIL CHEMO INJECTION 2.5 GM/50ML
400.0000 mg/m2 | Freq: Once | INTRAVENOUS | Status: AC
  Administered 2017-03-02: 700 mg via INTRAVENOUS
  Filled 2017-03-02: qty 14

## 2017-03-02 MED ORDER — PALONOSETRON HCL INJECTION 0.25 MG/5ML
0.2500 mg | Freq: Once | INTRAVENOUS | Status: AC
  Administered 2017-03-02: 0.25 mg via INTRAVENOUS

## 2017-03-02 MED ORDER — LOPERAMIDE HCL 2 MG PO TABS: 2.0000 mg | ORAL_TABLET | Freq: Four times a day (QID) | ORAL | 2 refills | Status: DC | PRN

## 2017-03-02 MED ORDER — OXALIPLATIN CHEMO INJECTION 100 MG/20ML
85.0000 mg/m2 | Freq: Once | INTRAVENOUS | Status: AC
  Administered 2017-03-02: 150 mg via INTRAVENOUS
  Filled 2017-03-02: qty 10

## 2017-03-02 MED ORDER — DEXTROSE 5 % IV SOLN
Freq: Once | INTRAVENOUS | Status: AC
  Administered 2017-03-02: 13:00:00 via INTRAVENOUS

## 2017-03-02 MED ORDER — SODIUM CHLORIDE 0.9% FLUSH
10.0000 mL | INTRAVENOUS | Status: DC | PRN
  Administered 2017-03-02: 10 mL via INTRAVENOUS
  Filled 2017-03-02: qty 10

## 2017-03-02 MED ORDER — DEXAMETHASONE SODIUM PHOSPHATE 10 MG/ML IJ SOLN
10.0000 mg | Freq: Once | INTRAMUSCULAR | Status: AC
  Administered 2017-03-02: 10 mg via INTRAVENOUS

## 2017-03-02 MED ORDER — HYDROCORTISONE 2.5 % EX CREA: TOPICAL_CREAM | Freq: Two times a day (BID) | CUTANEOUS | 0 refills | Status: DC

## 2017-03-02 MED ORDER — SODIUM CHLORIDE 0.9 % IV SOLN
2400.0000 mg/m2 | INTRAVENOUS | Status: AC
  Administered 2017-03-02: 4200 mg via INTRAVENOUS
  Filled 2017-03-02: qty 84

## 2017-03-02 MED ORDER — DEXTROSE 5 % IV SOLN
398.0000 mg/m2 | Freq: Once | INTRAVENOUS | Status: AC
  Administered 2017-03-02: 700 mg via INTRAVENOUS
  Filled 2017-03-02: qty 35

## 2017-03-02 MED ORDER — SODIUM CHLORIDE 0.9% FLUSH
3.0000 mL | Freq: Once | INTRAVENOUS | Status: DC | PRN
  Filled 2017-03-02: qty 10

## 2017-03-02 MED ORDER — PALONOSETRON HCL INJECTION 0.25 MG/5ML
INTRAVENOUS | Status: AC
  Filled 2017-03-02: qty 5

## 2017-03-02 MED ORDER — OXYCODONE HCL 10 MG PO TABS
10.0000 mg | ORAL_TABLET | ORAL | 0 refills | Status: DC | PRN
Start: 2017-03-02 — End: 2017-03-16

## 2017-03-02 MED ORDER — DEXAMETHASONE SODIUM PHOSPHATE 10 MG/ML IJ SOLN
INTRAMUSCULAR | Status: AC
  Filled 2017-03-02: qty 1

## 2017-03-02 MED FILL — ANTI-DIARRHEAL 2 MG CAPLET: 2 | 6 days supply | Qty: 24 | Fill #0

## 2017-03-02 MED FILL — HYDROCORTISONE 2.5% CREAM: 2.5 | 15 days supply | Qty: 30 | Fill #0

## 2017-03-02 MED FILL — oxyCODONE HCL 10 MG TABS: 10 | 15 days supply | Qty: 90 | Fill #0

## 2017-03-02 NOTE — Patient Instructions (Signed)
[facility] Discharge Instructions for Patients Receiving Chemotherapy  Today you received the following chemotherapy agents: Oxaliplatin, Leucovorin and Adrucil.  To help prevent nausea and vomiting after your treatment, we encourage you to take your nausea medication as directed. No Zofran for 3 days. Take Compazine instead.    If you develop nausea and vomiting that is not controlled by your nausea medication, call the clinic.   BELOW ARE SYMPTOMS THAT SHOULD BE REPORTED IMMEDIATELY:  *FEVER GREATER THAN 100.5 F  *CHILLS WITH OR WITHOUT FEVER  NAUSEA AND VOMITING THAT IS NOT CONTROLLED WITH YOUR NAUSEA MEDICATION  *UNUSUAL SHORTNESS OF BREATH  *UNUSUAL BRUISING OR BLEEDING  TENDERNESS IN MOUTH AND THROAT WITH OR WITHOUT PRESENCE OF ULCERS  *URINARY PROBLEMS  *BOWEL PROBLEMS  UNUSUAL RASH Items with * indicate a potential emergency and should be followed up as soon as possible.  Feel free to call the clinic you have any questions or concerns. The clinic phone number is [phone].  Please show the CHEMO ALERT CARD at check-in to the Emergency Department and triage nurse.   

## 2017-03-02 NOTE — Progress Notes (Signed)
Obtained signature from patient for [employer] application for drug replacement for Vectibix. Faxed to [place]. Fax received ok per confirmation sheet.

## 2017-03-02 NOTE — Addendum Note (Signed)
Addended by: [name] on: 03/02/2017 01:04 PM   Modules accepted: Orders

## 2017-03-04 ENCOUNTER — Telehealth: Payer: Self-pay

## 2017-03-04 ENCOUNTER — Ambulatory Visit (HOSPITAL_BASED_OUTPATIENT_CLINIC_OR_DEPARTMENT_OTHER): Payer: Medicaid Other

## 2017-03-04 VITALS — BP 112/77 | HR 111 | Temp 98.8°F | Resp 18

## 2017-03-04 DIAGNOSIS — C186 Malignant neoplasm of descending colon: Secondary | ICD-10-CM

## 2017-03-04 DIAGNOSIS — Z452 Encounter for adjustment and management of vascular access device: Secondary | ICD-10-CM | POA: Diagnosis not present

## 2017-03-04 MED ORDER — SODIUM CHLORIDE 0.9% FLUSH
10.0000 mL | INTRAVENOUS | Status: DC | PRN
  Administered 2017-03-04: 10 mL
  Filled 2017-03-04: qty 10

## 2017-03-04 MED ORDER — HEPARIN SOD (PORK) LOCK FLUSH 100 UNIT/ML IV SOLN
500.0000 [IU] | Freq: Once | INTRAVENOUS | Status: AC | PRN
  Administered 2017-03-04: 500 [IU]
  Filled 2017-03-04: qty 5

## 2017-03-04 NOTE — Congregational Nurse Program (Signed)
Congregational Nurse Program Note  Date of Encounter: 03/03/2017  Past Medical History: Past Medical History:  Diagnosis Date  . 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 Microcytic anemia    [name] 01/11/2017    Encounter Details:     CNP Questionnaire - 03/04/17 0004      Patient Demographics   Is this a new or existing patient? New   Patient is considered a/an Not Applicable   Race African-American/Black     Patient Assistance   Location of Patient Assistance Not Applicable   Patient's financial/insurance status Self-Pay (Uninsured)   Uninsured Patient ([facility]) No   Patient referred to apply for the following financial assistance Medicaid   Food insecurities addressed Not Applicable   Transportation assistance No   Assistance securing medications No   Educational health offerings Cancer     Encounter Details   Primary purpose of visit Chronic Illness/Condition Visit;Education/Health Concerns;Spiritual Care/Support Visit   Was an Emergency Department visit averted? Not Applicable   Does patient have a medical provider? Yes   Patient referred to Follow up with established PCP   Was a mental health screening completed? (GAINS tool) No   Does patient have dental issues? No   Does patient have vision issues? No   Does your patient have an abnormal blood pressure today? No   Since previous encounter, have you referred patient for abnormal blood pressure that resulted in a new diagnosis or medication change? No   Does your patient have an abnormal blood glucose today? No   Since previous encounter, have you referred patient for abnormal blood glucose that resulted in a new diagnosis or medication change? No   Was there a life-saving intervention made? No     Initial visit with HOPES team today. Client cooking his dinner . Appetite  good ,also drinks  Ensure . States he cooks and use to [employer] a lot. Has food and has used some of his gift  card given to him for food and gas . Did not keep receipts  because he was not to.  Nurse reviewed HOPES agreement  explaining the purpose of   the [place] team visits and gave client a copy. 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 He states he had pain for over 3 years going back and forth to ED and they never found anything . He states if it had not been for pain medication he couldn't of made it but finally the pain took over  and he ended up back in the ED and in the hospital with cancer, stage IV . He is definitely angry that it was not  found until recently . States his pain is  much better now on a scale 0-10 with  10 being the most  Severe he states a 5 , and is  pleased with the services of his MD at the cancer center, has no PCP only his cancer MD .He has a very positive  outlook about himself states  " must  Go on" and that is who he is ,nothing will keep him down.His daughter is very close to him as he referred to her a lot asking the team to check with her. SW will make contact with daughter to let her know we are visiting and to see if there is any thing we need to know about plans for  housing . States he has a good support system,other children. and some friends.States he is  not  "depressed " that is not him. Treatment .on yesterday went well ,long day he reports . Goals for client ;  Assure that his disability continues to move forward,  shared a letter with SW , assure that his care /appontments at the cancer center run smoothly ( transportation provided by medicaid ) , medications no problems with  them ,housing . Client wants a place to live states he moved out of his previous residence over the week end ,sold most of his belongings . Team reviewed his  treatment schedule and arranged  the next visit . Client has team phone numbers will call us if anything changes before next visit. Visit  Set for 03-08-17 @ 5:30  pm

## 2017-03-04 NOTE — Telephone Encounter (Signed)
Nurse returned call from client after a note was left for him to  call the HOPES team. Client states he was not aware of team coming out on 02-26-17 and 02-27-17 . Nurse explained reasons for visits and was able to set visit date for 03-03-17. Client agreeable, states he had been moving his belongings and had to get it all done by 02-28-17.  Team did not have clients cell phone number but  was attempting to catch up with client to introduce themselves to him

## 2017-03-04 NOTE — Telephone Encounter (Signed)
Client called to verify appointment time . Visit time vertified

## 2017-03-05 ENCOUNTER — Encounter: Payer: Self-pay | Admitting: Hematology

## 2017-03-05 NOTE — Progress Notes (Signed)
Received approval from [place] for [employer]. Patient approved for free drug 03/04/17-03/04/18 as long as he continues to meet program eligibility requirements. Emailed copy to drug replacement communication team and Rob in pharmacy.
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
## 2017-03-16 ENCOUNTER — Encounter: Payer: Self-pay | Admitting: Hematology

## 2017-03-16 ENCOUNTER — Ambulatory Visit (HOSPITAL_BASED_OUTPATIENT_CLINIC_OR_DEPARTMENT_OTHER): Payer: Medicaid Other

## 2017-03-16 ENCOUNTER — Other Ambulatory Visit (HOSPITAL_BASED_OUTPATIENT_CLINIC_OR_DEPARTMENT_OTHER): Payer: Medicaid Other

## 2017-03-16 ENCOUNTER — Ambulatory Visit (HOSPITAL_BASED_OUTPATIENT_CLINIC_OR_DEPARTMENT_OTHER): Payer: Medicaid Other | Admitting: Hematology

## 2017-03-16 ENCOUNTER — Telehealth: Payer: Self-pay | Admitting: Hematology

## 2017-03-16 ENCOUNTER — Ambulatory Visit: Payer: Medicaid Other | Admitting: Nutrition

## 2017-03-16 ENCOUNTER — Other Ambulatory Visit: Payer: Self-pay | Admitting: *Deleted

## 2017-03-16 VITALS — BP 125/81 | HR 102 | Temp 98.6°F | Resp 18 | Ht 69.0 in | Wt 137.2 lb

## 2017-03-16 DIAGNOSIS — Z95828 Presence of other vascular implants and grafts: Secondary | ICD-10-CM

## 2017-03-16 DIAGNOSIS — C186 Malignant neoplasm of descending colon: Secondary | ICD-10-CM

## 2017-03-16 DIAGNOSIS — Z5112 Encounter for antineoplastic immunotherapy: Secondary | ICD-10-CM | POA: Diagnosis present

## 2017-03-16 DIAGNOSIS — Z5111 Encounter for antineoplastic chemotherapy: Secondary | ICD-10-CM

## 2017-03-16 DIAGNOSIS — G893 Neoplasm related pain (acute) (chronic): Secondary | ICD-10-CM

## 2017-03-16 DIAGNOSIS — D500 Iron deficiency anemia secondary to blood loss (chronic): Secondary | ICD-10-CM

## 2017-03-16 DIAGNOSIS — D630 Anemia in neoplastic disease: Secondary | ICD-10-CM | POA: Diagnosis not present

## 2017-03-16 DIAGNOSIS — Z452 Encounter for adjustment and management of vascular access device: Secondary | ICD-10-CM | POA: Diagnosis not present

## 2017-03-16 DIAGNOSIS — I82411 Acute embolism and thrombosis of right femoral vein: Secondary | ICD-10-CM | POA: Diagnosis not present

## 2017-03-16 DIAGNOSIS — C786 Secondary malignant neoplasm of retroperitoneum and peritoneum: Secondary | ICD-10-CM

## 2017-03-16 DIAGNOSIS — C762 Malignant neoplasm of abdomen: Secondary | ICD-10-CM

## 2017-03-16 LAB — FERRITIN: Ferritin: 1510 ng/ml — ABNORMAL HIGH (ref 22–316)

## 2017-03-16 LAB — CBC WITH DIFFERENTIAL/PLATELET
BASO%: 1 % (ref 0.0–2.0)
BASOS ABS: 0.1 10*3/uL (ref 0.0–0.1)
EOS ABS: 0.2 10*3/uL (ref 0.0–0.5)
EOS%: 2.2 % (ref 0.0–7.0)
HCT: 32.9 % — ABNORMAL LOW (ref 38.4–49.9)
HGB: 10.6 g/dL — ABNORMAL LOW (ref 13.0–17.1)
LYMPH%: 25.4 % (ref 14.0–49.0)
MCH: 25.3 pg — AB (ref 27.2–33.4)
MCHC: 32.3 g/dL (ref 32.0–36.0)
MCV: 78.3 fL — AB (ref 79.3–98.0)
MONO#: 0.7 10*3/uL (ref 0.1–0.9)
MONO%: 10.5 % (ref 0.0–14.0)
NEUT%: 60.9 % (ref 39.0–75.0)
NEUTROS ABS: 4.3 10*3/uL (ref 1.5–6.5)
PLATELETS: 107 10*3/uL — AB (ref 140–400)
RBC: 4.21 10*6/uL (ref 4.20–5.82)
RDW: 30.2 % — ABNORMAL HIGH (ref 11.0–14.6)
WBC: 7.1 10*3/uL (ref 4.0–10.3)
lymph#: 1.8 10*3/uL (ref 0.9–3.3)

## 2017-03-16 LAB — COMPREHENSIVE METABOLIC PANEL
ALT: 10 U/L (ref 0–55)
ANION GAP: 10 meq/L (ref 3–11)
AST: 28 U/L (ref 5–34)
Albumin: 3.5 g/dL (ref 3.5–5.0)
Alkaline Phosphatase: 90 U/L (ref 40–150)
BUN: 10.6 mg/dL (ref 7.0–26.0)
CO2: 23 mEq/L (ref 22–29)
Calcium: 9.1 mg/dL (ref 8.4–10.4)
Chloride: 106 mEq/L (ref 98–109)
Creatinine: 1 mg/dL (ref 0.7–1.3)
Glucose: 87 mg/dl (ref 70–140)
Potassium: 4.3 mEq/L (ref 3.5–5.1)
Sodium: 140 mEq/L (ref 136–145)
TOTAL PROTEIN: 8.3 g/dL (ref 6.4–8.3)
Total Bilirubin: 0.24 mg/dL (ref 0.20–1.20)

## 2017-03-16 LAB — IRON AND TIBC
%SAT: 46 % (ref 20–55)
IRON: 133 ug/dL (ref 42–163)
TIBC: 286 ug/dL (ref 202–409)
UIBC: 154 ug/dL (ref 117–376)

## 2017-03-16 LAB — MAGNESIUM: MAGNESIUM: 2.1 mg/dL (ref 1.5–2.5)

## 2017-03-16 LAB — CEA (IN HOUSE-CHCC): CEA (CHCC-In House): 15.14 ng/mL — ABNORMAL HIGH (ref 0.00–5.00)

## 2017-03-16 MED ORDER — LEUCOVORIN CALCIUM INJECTION 350 MG
398.0000 mg/m2 | Freq: Once | INTRAVENOUS | Status: AC
  Administered 2017-03-16: 700 mg via INTRAVENOUS
  Filled 2017-03-16: qty 35

## 2017-03-16 MED ORDER — PALONOSETRON HCL INJECTION 0.25 MG/5ML
INTRAVENOUS | Status: AC
  Filled 2017-03-16: qty 5

## 2017-03-16 MED ORDER — PANITUMUMAB CHEMO INJECTION 100 MG/5ML
6.0000 mg/kg | Freq: Once | INTRAVENOUS | Status: AC
  Administered 2017-03-16: 360 mg via INTRAVENOUS
  Filled 2017-03-16: qty 18

## 2017-03-16 MED ORDER — SODIUM CHLORIDE 0.9 % IV SOLN
2400.0000 mg/m2 | INTRAVENOUS | Status: DC
  Administered 2017-03-16: 4200 mg via INTRAVENOUS
  Filled 2017-03-16: qty 84

## 2017-03-16 MED ORDER — PALONOSETRON HCL INJECTION 0.25 MG/5ML
0.2500 mg | Freq: Once | INTRAVENOUS | Status: AC
  Administered 2017-03-16: 0.25 mg via INTRAVENOUS

## 2017-03-16 MED ORDER — OXALIPLATIN CHEMO INJECTION 100 MG/20ML
85.0000 mg/m2 | Freq: Once | INTRAVENOUS | Status: AC
  Administered 2017-03-16: 150 mg via INTRAVENOUS
  Filled 2017-03-16: qty 10

## 2017-03-16 MED ORDER — DEXTROSE 5 % IV SOLN
Freq: Once | INTRAVENOUS | Status: AC
  Administered 2017-03-16: 12:00:00 via INTRAVENOUS

## 2017-03-16 MED ORDER — SODIUM CHLORIDE 0.9% FLUSH
10.0000 mL | INTRAVENOUS | Status: DC | PRN
  Administered 2017-03-16: 10 mL via INTRAVENOUS
  Filled 2017-03-16: qty 10

## 2017-03-16 MED ORDER — OXYCODONE HCL 10 MG PO TABS: 10.0000 mg | ORAL_TABLET | Freq: Four times a day (QID) | ORAL | 0 refills | Status: DC | PRN

## 2017-03-16 MED ORDER — SODIUM CHLORIDE 0.9 % IV SOLN
Freq: Once | INTRAVENOUS | Status: AC
  Administered 2017-03-16: 13:00:00 via INTRAVENOUS

## 2017-03-16 MED ORDER — DEXAMETHASONE SODIUM PHOSPHATE 10 MG/ML IJ SOLN
10.0000 mg | Freq: Once | INTRAMUSCULAR | Status: AC
Start: 2017-03-16 — End: 2017-03-16
  Administered 2017-03-16: 10 mg via INTRAVENOUS

## 2017-03-16 MED ORDER — DEXAMETHASONE SODIUM PHOSPHATE 10 MG/ML IJ SOLN
INTRAMUSCULAR | Status: AC
  Filled 2017-03-16: qty 1

## 2017-03-16 MED ORDER — RIVAROXABAN 20 MG PO TABS: 20.0000 mg | ORAL_TABLET | Freq: Every day | ORAL | 1 refills | Status: DC

## 2017-03-16 MED FILL — oxyCODONE HCL 10 MG TABS: 10 | 22 days supply | Qty: 90 | Fill #0

## 2017-03-16 MED FILL — XARELTO 20 MG TABLET: 20 | 30 days supply | Qty: 30 | Fill #0

## 2017-03-16 NOTE — Telephone Encounter (Signed)
Labs, flush, follow-up & Chemo appointments scheduled in 2 & 4 weeks, per 03/16/17 los. Patient was given a copy of the AVS report and appointment schedule, per 03/16/17 los.

## 2017-03-16 NOTE — Progress Notes (Signed)
Nutrition follow-up completed with patient receiving chemotherapy for colon cancer. Weight improved documented as 137.2 pounds March 20 increased from 134.8 pounds March 6. Patient very sleepy during conversation; difficult to obtain information. Reports he continues to drink one Ensure Plus a day. He denies nutrition impact symptoms.  Nutrition diagnosis: Inadequate oral intake improved.  Intervention: Educated patient to continue strategies for increased calories and protein. Encouraged him to continue Ensure Plus at least one bottle daily. Teach back method used.  Monitoring, evaluation, goals:  Patient will tolerate increased calories and protein to promote weight maintenance/weight gain.  Next visit: Tuesday, April 17 in infusion.  **Disclaimer: This note was dictated with voice recognition software. Similar sounding words can inadvertently be transcribed and this note may contain transcription errors which may not have been corrected upon publication of note.**

## 2017-03-16 NOTE — Patient Instructions (Addendum)
[facility] Discharge Instructions for Patients Receiving Chemotherapy  Today you received the following chemotherapy agents , Vectibix, Oxaliplatin, Leucovorin and Adrucil  To help prevent nausea and vomiting after your treatment, we encourage you to take your nausea medication as directed. No Zofran for 3 days. Take Compazine instead.    If you develop nausea and vomiting that is not controlled by your nausea medication, call the clinic.   BELOW ARE SYMPTOMS THAT SHOULD BE REPORTED IMMEDIATELY:  *FEVER GREATER THAN 100.5 F  *CHILLS WITH OR WITHOUT FEVER  NAUSEA AND VOMITING THAT IS NOT CONTROLLED WITH YOUR NAUSEA MEDICATION  *UNUSUAL SHORTNESS OF BREATH  *UNUSUAL BRUISING OR BLEEDING  TENDERNESS IN MOUTH AND THROAT WITH OR WITHOUT PRESENCE OF ULCERS  *URINARY PROBLEMS  *BOWEL PROBLEMS  UNUSUAL RASH Items with * indicate a potential emergency and should be followed up as soon as possible.  Feel free to call the clinic you have any questions or concerns. The clinic phone number is [phone].  Please show the [facility] at check-in to the Emergency Department and triage nurse.

## 2017-03-17 ENCOUNTER — Telehealth: Payer: Self-pay

## 2017-03-17 NOTE — Telephone Encounter (Signed)
Pt stated he is doing ok. He denied any nausea or pain or skin issues. He stated "I always do" when asked if he is eating and drinking OK.

## 2017-03-17 NOTE — Telephone Encounter (Signed)
-----   Message from Ma Hillock, RN sent at 03/16/2017  4:41 PM EDT ----- Regarding: [facility]: Chemo F/U Patient received first dose of Vectibix and tolerated the infusion without any difficulty. Please call patient to follow up.

## 2017-03-18 ENCOUNTER — Ambulatory Visit (HOSPITAL_BASED_OUTPATIENT_CLINIC_OR_DEPARTMENT_OTHER): Payer: Medicaid Other

## 2017-03-18 ENCOUNTER — Encounter: Payer: Self-pay | Admitting: *Deleted

## 2017-03-18 VITALS — BP 117/72 | HR 100 | Temp 98.5°F | Resp 16

## 2017-03-18 DIAGNOSIS — C186 Malignant neoplasm of descending colon: Secondary | ICD-10-CM

## 2017-03-18 DIAGNOSIS — Z452 Encounter for adjustment and management of vascular access device: Secondary | ICD-10-CM

## 2017-03-18 MED ORDER — SODIUM CHLORIDE 0.9% FLUSH
10.0000 mL | INTRAVENOUS | Status: DC | PRN
  Administered 2017-03-18: 10 mL
  Filled 2017-03-18: qty 10

## 2017-03-18 MED ORDER — HEPARIN SOD (PORK) LOCK FLUSH 100 UNIT/ML IV SOLN
500.0000 [IU] | Freq: Once | INTRAVENOUS | Status: AC | PRN
  Administered 2017-03-18: 500 [IU]
  Filled 2017-03-18: qty 5

## 2017-03-18 NOTE — Congregational Nurse Program (Signed)
Congregational Nurse Program Note  Date of Encounter: 03/12/2017  Past Medical History: Past Medical History:  Diagnosis Date  . 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 Microcytic anemia    Jeff [name] 01/11/2017    Encounter Details:     CNP Questionnaire - 03/18/17 0120      Patient Demographics   Is this a new or existing patient? Existing   Patient is considered a/an Not Applicable   Race African-American/Black     Patient Assistance   Location of Patient Assistance Not Applicable   Patient's financial/insurance status Self-Pay (Uninsured)   Uninsured Patient ([facility]) No   Patient referred to apply for the following financial assistance Medicaid   Food insecurities addressed Not Applicable   Transportation assistance No   Assistance securing medications No   Educational health offerings Cancer     Encounter Details   Primary purpose of visit Education/Health Concerns;Chronic Illness/Condition Visit;Spiritual Care/Support Visit   Was an Emergency Department visit averted? Not Applicable   Does patient have a medical provider? Yes   Patient referred to Follow up with established PCP   Was a mental health screening completed? (GAINS tool) No   Does patient have dental issues? No   Does patient have vision issues? No   Does your patient have an abnormal blood pressure today? No   Since previous encounter, have you referred patient for abnormal blood pressure that resulted in a new diagnosis or medication change? No   Does your patient have an abnormal blood glucose today? No   Since previous encounter, have you referred patient for abnormal blood glucose that resulted in a new diagnosis or medication change? No   Was there a life-saving intervention made? No    Joint visit by [facility] team. Client up and about. This week client has been off no treatments but  Next week has a schedule of appointments on Tuesday and Thursday. States he has  been out today  But  Has just  Sort of been relaxing since he doesn't have MD visits this week. Concerns about his  Xarelto ,states he plans to stop it  But will talk to his  MD first next week ,. Side effects are nose bleeds every day ,states some are hard to stop. . No other symptoms . On a scale of 1-10  States his pain is about at a 76 . SW has met his  Daughter and she will assist her with housing issues . Sw shared with him some options to look into and he is looking at several himself. Counseled  Him to make sure his  Choices will be within his proposed income and to be able o manage other essentials.. Client states he started getting his disability $750.00 per month ,food stamps to be adjusted  4-1 . Will need furniture and household essentials . Client is very motivated to move on with his life and do what he needs to do . Plans are to get housing ,continue his treatments . SW will work with him on housing and can give a voucher for [employer]  to help with  furniture weekly visits .

## 2017-03-18 NOTE — Progress Notes (Signed)
[place] Work  Clinical Social Work was referred by need for CSW follow up to check in re. housing plan and reassess needs. Clinical Social Worker contacted pt via phone and met with patient at [facility] after his pump disconect to offer support and assess for needs.  Pt reports things are going well and he has been researching housing options. He should get his first SSI check soon. His daughter has encouraged him to visit apartments and begin the process. He shared that he had to have his birth certificate for one option and was headed to register of deeds to obtain. He feels he may need to stay at his transitional housing location past March 30 in order to get all moved into a new place. He feels he may need until April 15. CSW encouraged pt to keep [place] in the loop on his plans and update this CSW team and Congregational RN/CSW as plans unfold. Pt agreed and appeared eager to move forward with plans for stable housing. CSW explained to pt of one possible resource available to help with power bill that is still outstanding or deposit for new power connect. CSW team will continue to follow.   Clinical Social Work interventions:  Check in Resource education and referral  Loren Racer, LCSW, OSW-C Clinical Social Worker [place]  [place] Phone: [phone] Fax: [phone]

## 2017-03-25 ENCOUNTER — Encounter: Payer: Self-pay | Admitting: *Deleted

## 2017-03-25 NOTE — Progress Notes (Signed)
[place] Work  Holiday representative contacted patient at home to offer support and assess for needs.Pt was out with his daughter looking at apartments. He reports he has applied at several places and is waiting to hear back. He is being assisted by his daughter in looking at places. Pt reports he needs some additional food assistance and Ensure through dietician. CSW to assist/refer. Pt denied other needs currently. He was excited that his birthday was Monday. CSW notified CSW Asst Director of need for [employer] extension. CSW team to follow up at next appointment on status of housing search.     Clinical Social Work interventions:  Resource referral Pt advocacy  Loren Racer, LCSW, OSW-C Clinical Social Worker [place]  [place] Phone: [phone] Fax: [phone]

## 2017-03-26 ENCOUNTER — Encounter (HOSPITAL_COMMUNITY): Payer: Self-pay

## 2017-03-29 ENCOUNTER — Ambulatory Visit: Payer: Medicaid Other

## 2017-03-29 ENCOUNTER — Ambulatory Visit (HOSPITAL_BASED_OUTPATIENT_CLINIC_OR_DEPARTMENT_OTHER): Payer: Medicaid Other | Admitting: Oncology

## 2017-03-29 ENCOUNTER — Encounter: Payer: Self-pay | Admitting: Oncology

## 2017-03-29 ENCOUNTER — Other Ambulatory Visit (HOSPITAL_BASED_OUTPATIENT_CLINIC_OR_DEPARTMENT_OTHER): Payer: Medicaid Other

## 2017-03-29 VITALS — BP 129/72 | HR 102 | Temp 98.5°F | Resp 18 | Ht 69.0 in | Wt 137.7 lb

## 2017-03-29 DIAGNOSIS — C762 Malignant neoplasm of abdomen: Secondary | ICD-10-CM

## 2017-03-29 DIAGNOSIS — I82401 Acute embolism and thrombosis of unspecified deep veins of right lower extremity: Secondary | ICD-10-CM | POA: Diagnosis not present

## 2017-03-29 DIAGNOSIS — D500 Iron deficiency anemia secondary to blood loss (chronic): Secondary | ICD-10-CM

## 2017-03-29 DIAGNOSIS — Z452 Encounter for adjustment and management of vascular access device: Secondary | ICD-10-CM | POA: Diagnosis not present

## 2017-03-29 DIAGNOSIS — C186 Malignant neoplasm of descending colon: Secondary | ICD-10-CM

## 2017-03-29 DIAGNOSIS — C786 Secondary malignant neoplasm of retroperitoneum and peritoneum: Secondary | ICD-10-CM

## 2017-03-29 DIAGNOSIS — D630 Anemia in neoplastic disease: Secondary | ICD-10-CM | POA: Diagnosis not present

## 2017-03-29 DIAGNOSIS — Z95828 Presence of other vascular implants and grafts: Secondary | ICD-10-CM

## 2017-03-29 DIAGNOSIS — D509 Iron deficiency anemia, unspecified: Secondary | ICD-10-CM

## 2017-03-29 LAB — COMPREHENSIVE METABOLIC PANEL
ALT: 12 U/L (ref 0–55)
ANION GAP: 10 meq/L (ref 3–11)
AST: 27 U/L (ref 5–34)
Albumin: 3.3 g/dL — ABNORMAL LOW (ref 3.5–5.0)
Alkaline Phosphatase: 90 U/L (ref 40–150)
BUN: 8.6 mg/dL (ref 7.0–26.0)
CHLORIDE: 105 meq/L (ref 98–109)
CO2: 22 mEq/L (ref 22–29)
Calcium: 9.2 mg/dL (ref 8.4–10.4)
Creatinine: 0.8 mg/dL (ref 0.7–1.3)
Glucose: 94 mg/dl (ref 70–140)
Potassium: 4 mEq/L (ref 3.5–5.1)
Sodium: 138 mEq/L (ref 136–145)
Total Bilirubin: 0.35 mg/dL (ref 0.20–1.20)
Total Protein: 8.2 g/dL (ref 6.4–8.3)

## 2017-03-29 LAB — CBC WITH DIFFERENTIAL/PLATELET
BASO%: 0.7 % (ref 0.0–2.0)
Basophils Absolute: 0 10*3/uL (ref 0.0–0.1)
EOS%: 2.2 % (ref 0.0–7.0)
Eosinophils Absolute: 0.1 10*3/uL (ref 0.0–0.5)
HCT: 34.6 % — ABNORMAL LOW (ref 38.4–49.9)
HGB: 11 g/dL — ABNORMAL LOW (ref 13.0–17.1)
LYMPH%: 31 % (ref 14.0–49.0)
MCH: 26.1 pg — ABNORMAL LOW (ref 27.2–33.4)
MCHC: 31.8 g/dL — AB (ref 32.0–36.0)
MCV: 82.2 fL (ref 79.3–98.0)
MONO#: 0.9 10*3/uL (ref 0.1–0.9)
MONO%: 14.4 % — ABNORMAL HIGH (ref 0.0–14.0)
NEUT#: 3.1 10*3/uL (ref 1.5–6.5)
NEUT%: 51.7 % (ref 39.0–75.0)
PLATELETS: 102 10*3/uL — AB (ref 140–400)
RBC: 4.21 10*6/uL (ref 4.20–5.82)
RDW: 28 % — ABNORMAL HIGH (ref 11.0–14.6)
WBC: 5.9 10*3/uL (ref 4.0–10.3)
lymph#: 1.8 10*3/uL (ref 0.9–3.3)

## 2017-03-29 LAB — MAGNESIUM: Magnesium: 1.9 mg/dl (ref 1.5–2.5)

## 2017-03-29 MED ORDER — HEPARIN SOD (PORK) LOCK FLUSH 100 UNIT/ML IV SOLN
500.0000 [IU] | INTRAVENOUS | Status: DC | PRN
  Administered 2017-03-29: 500 [IU] via INTRAVENOUS
  Filled 2017-03-29: qty 5

## 2017-03-29 MED ORDER — OXYCODONE HCL 10 MG PO TABS: 10.0000 mg | ORAL_TABLET | Freq: Four times a day (QID) | ORAL | 0 refills | Status: DC | PRN

## 2017-03-29 MED ORDER — ONDANSETRON HCL 8 MG PO TABS: 8.0000 mg | ORAL_TABLET | Freq: Two times a day (BID) | ORAL | 1 refills | Status: DC | PRN

## 2017-03-29 MED ORDER — SODIUM CHLORIDE 0.9% FLUSH
10.0000 mL | INTRAVENOUS | Status: DC | PRN
  Administered 2017-03-29 (×2): 10 mL via INTRAVENOUS
  Filled 2017-03-29: qty 10

## 2017-03-29 MED ORDER — FERROUS SULFATE 325 (65 FE) MG PO TABS: 325.0000 mg | ORAL_TABLET | Freq: Two times a day (BID) | ORAL | 0 refills | Status: DC

## 2017-03-29 MED ORDER — SODIUM CHLORIDE 0.9% FLUSH
10.0000 mL | INTRAVENOUS | Status: DC | PRN
  Filled 2017-03-29: qty 10

## 2017-03-29 MED FILL — ONDANSETRON HCL 8 MG TABLET: 8 | 15 days supply | Qty: 30 | Fill #0

## 2017-03-29 MED FILL — FERROUS SULFATE 325 MG TAB: 325 (65 FE) | 30 days supply | Qty: 60 | Fill #0

## 2017-03-29 NOTE — Patient Instructions (Signed)
Implanted Port Home Guide An implanted port is a type of central line that is placed under the skin. Central lines are used to provide IV access when treatment or nutrition needs to be given through a person's veins. Implanted ports are used for long-term IV access. An implanted port may be placed because:  You need IV medicine that would be irritating to the small veins in your hands or arms.  You need long-term IV medicines, such as antibiotics.  You need IV nutrition for a long period.  You need frequent blood draws for lab tests.  You need dialysis.  Implanted ports are usually placed in the chest area, but they can also be placed in the upper arm, the abdomen, or the leg. An implanted port has two main parts:  Reservoir. The reservoir is round and will appear as a small, raised area under your skin. The reservoir is the part where a needle is inserted to give medicines or draw blood.  Catheter. The catheter is a thin, flexible tube that extends from the reservoir. The catheter is placed into a large vein. Medicine that is inserted into the reservoir goes into the catheter and then into the vein.  How will I care for my incision site? Do not get the incision site wet. Bathe or shower as directed by your health care provider. How is my port accessed? Special steps must be taken to access the port:  Before the port is accessed, a numbing cream can be placed on the skin. This helps numb the skin over the port site.  Your health care provider uses a sterile technique to access the port. ? Your health care provider must put on a mask and sterile gloves. ? The skin over your port is cleaned carefully with an antiseptic and allowed to dry. ? The port is gently pinched between sterile gloves, and a needle is inserted into the port.  Only "non-coring" port needles should be used to access the port. Once the port is accessed, a blood return should be checked. This helps ensure that the port  is in the vein and is not clogged.  If your port needs to remain accessed for a constant infusion, a clear (transparent) bandage will be placed over the needle site. The bandage and needle will need to be changed every week, or as directed by your health care provider.  Keep the bandage covering the needle clean and dry. Do not get it wet. Follow your health care provider's instructions on how to take a shower or bath while the port is accessed.  If your port does not need to stay accessed, no bandage is needed over the port.  What is flushing? Flushing helps keep the port from getting clogged. Follow your health care provider's instructions on how and when to flush the port. Ports are usually flushed with saline solution or a medicine called heparin. The need for flushing will depend on how the port is used.  If the port is used for intermittent medicines or blood draws, the port will need to be flushed: ? After medicines have been given. ? After blood has been drawn. ? As part of routine maintenance.  If a constant infusion is running, the port may not need to be flushed.  How long will my port stay implanted? The port can stay in for as long as your health care provider thinks it is needed. When it is time for the port to come out, surgery will be   done to remove it. The procedure is similar to the one performed when the port was put in. When should I seek immediate medical care? When you have an implanted port, you should seek immediate medical care if:  You notice a bad smell coming from the incision site.  You have swelling, redness, or drainage at the incision site.  You have more swelling or pain at the port site or the surrounding area.  You have a fever that is not controlled with medicine.  This information is not intended to replace advice given to you by your health care provider. Make sure you discuss any questions you have with your health care provider. Document  Released: 12/14/2005 Document Revised: 05/21/2016 Document Reviewed: 08/21/2013 Elsevier Interactive Patient Education  2017 [employer]  
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
## 2017-03-30 ENCOUNTER — Ambulatory Visit (HOSPITAL_BASED_OUTPATIENT_CLINIC_OR_DEPARTMENT_OTHER): Payer: Medicaid Other

## 2017-03-30 VITALS — BP 128/90 | HR 100 | Temp 98.4°F | Resp 18

## 2017-03-30 DIAGNOSIS — Z5112 Encounter for antineoplastic immunotherapy: Secondary | ICD-10-CM

## 2017-03-30 DIAGNOSIS — C186 Malignant neoplasm of descending colon: Secondary | ICD-10-CM | POA: Diagnosis not present

## 2017-03-30 DIAGNOSIS — Z5111 Encounter for antineoplastic chemotherapy: Secondary | ICD-10-CM

## 2017-03-30 MED ORDER — SODIUM CHLORIDE 0.9 % IV SOLN
2400.0000 mg/m2 | INTRAVENOUS | Status: DC
  Administered 2017-03-30: 4200 mg via INTRAVENOUS
  Filled 2017-03-30: qty 84

## 2017-03-30 MED ORDER — SODIUM CHLORIDE 0.9 % IV SOLN
Freq: Once | INTRAVENOUS | Status: AC
  Administered 2017-03-30: 08:00:00 via INTRAVENOUS

## 2017-03-30 MED ORDER — SODIUM CHLORIDE 0.9 % IV SOLN
6.0000 mg/kg | Freq: Once | INTRAVENOUS | Status: AC
  Administered 2017-03-30: 360 mg via INTRAVENOUS
  Filled 2017-03-30: qty 18

## 2017-03-30 MED ORDER — OXALIPLATIN CHEMO INJECTION 100 MG/20ML
85.0000 mg/m2 | Freq: Once | INTRAVENOUS | Status: AC
  Administered 2017-03-30: 150 mg via INTRAVENOUS
  Filled 2017-03-30: qty 10

## 2017-03-30 MED ORDER — PALONOSETRON HCL INJECTION 0.25 MG/5ML
INTRAVENOUS | Status: AC
  Filled 2017-03-30: qty 5

## 2017-03-30 MED ORDER — DEXAMETHASONE SODIUM PHOSPHATE 10 MG/ML IJ SOLN
INTRAMUSCULAR | Status: AC
  Filled 2017-03-30: qty 1

## 2017-03-30 MED ORDER — LEUCOVORIN CALCIUM INJECTION 350 MG
398.0000 mg/m2 | Freq: Once | INTRAVENOUS | Status: AC
  Administered 2017-03-30: 700 mg via INTRAVENOUS
  Filled 2017-03-30: qty 35

## 2017-03-30 MED ORDER — DEXAMETHASONE SODIUM PHOSPHATE 10 MG/ML IJ SOLN
10.0000 mg | Freq: Once | INTRAMUSCULAR | Status: AC
  Administered 2017-03-30: 10 mg via INTRAVENOUS

## 2017-03-30 MED ORDER — DEXTROSE 5 % IV SOLN
Freq: Once | INTRAVENOUS | Status: AC
  Administered 2017-03-30: 10:00:00 via INTRAVENOUS

## 2017-03-30 MED ORDER — PALONOSETRON HCL INJECTION 0.25 MG/5ML
0.2500 mg | Freq: Once | INTRAVENOUS | Status: AC
Start: 2017-03-30 — End: 2017-03-30
  Administered 2017-03-30: 0.25 mg via INTRAVENOUS

## 2017-03-30 NOTE — Patient Instructions (Signed)
[place] Discharge Instructions for Patients Receiving Chemotherapy  Today you received the following chemotherapy agents Vectibix/5 Fu/Leucovorin/Oxaliplatin To help prevent nausea and vomiting after your treatment, we encourage you to take your nausea medication as prescribed.   If you develop nausea and vomiting that is not controlled by your nausea medication, call the clinic.   BELOW ARE SYMPTOMS THAT SHOULD BE REPORTED IMMEDIATELY:  *FEVER GREATER THAN 100.5 F  *CHILLS WITH OR WITHOUT FEVER  NAUSEA AND VOMITING THAT IS NOT CONTROLLED WITH YOUR NAUSEA MEDICATION  *UNUSUAL SHORTNESS OF BREATH  *UNUSUAL BRUISING OR BLEEDING  TENDERNESS IN MOUTH AND THROAT WITH OR WITHOUT PRESENCE OF ULCERS  *URINARY PROBLEMS  *BOWEL PROBLEMS  UNUSUAL RASH Items with * indicate a potential emergency and should be followed up as soon as possible.  Feel free to call the clinic you have any questions or concerns. The clinic phone number is [phone].  Please show the [place] at check-in to the Emergency Department and triage nurse.

## 2017-04-01 ENCOUNTER — Ambulatory Visit (HOSPITAL_BASED_OUTPATIENT_CLINIC_OR_DEPARTMENT_OTHER): Payer: Medicaid Other

## 2017-04-01 VITALS — BP 106/67 | HR 96 | Temp 98.4°F | Resp 18

## 2017-04-01 DIAGNOSIS — Z452 Encounter for adjustment and management of vascular access device: Secondary | ICD-10-CM

## 2017-04-01 DIAGNOSIS — C186 Malignant neoplasm of descending colon: Secondary | ICD-10-CM | POA: Diagnosis not present

## 2017-04-01 MED ORDER — HEPARIN SOD (PORK) LOCK FLUSH 100 UNIT/ML IV SOLN
500.0000 [IU] | Freq: Once | INTRAVENOUS | Status: AC | PRN
  Administered 2017-04-01: 500 [IU]
  Filled 2017-04-01: qty 5

## 2017-04-01 MED ORDER — SODIUM CHLORIDE 0.9% FLUSH
10.0000 mL | INTRAVENOUS | Status: DC | PRN
  Administered 2017-04-01: 10 mL
  Filled 2017-04-01: qty 10

## 2017-04-01 NOTE — Patient Instructions (Signed)
Implanted Port Home Guide An implanted port is a type of central line that is placed under the skin. Central lines are used to provide IV access when treatment or nutrition needs to be given through a person's veins. Implanted ports are used for long-term IV access. An implanted port may be placed because:  You need IV medicine that would be irritating to the small veins in your hands or arms.  You need long-term IV medicines, such as antibiotics.  You need IV nutrition for a long period.  You need frequent blood draws for lab tests.  You need dialysis.  Implanted ports are usually placed in the chest area, but they can also be placed in the upper arm, the abdomen, or the leg. An implanted port has two main parts:  Reservoir. The reservoir is round and will appear as a small, raised area under your skin. The reservoir is the part where a needle is inserted to give medicines or draw blood.  Catheter. The catheter is a thin, flexible tube that extends from the reservoir. The catheter is placed into a large vein. Medicine that is inserted into the reservoir goes into the catheter and then into the vein.  How will I care for my incision site? Do not get the incision site wet. Bathe or shower as directed by your health care provider. How is my port accessed? Special steps must be taken to access the port:  Before the port is accessed, a numbing cream can be placed on the skin. This helps numb the skin over the port site.  Your health care provider uses a sterile technique to access the port. ? Your health care provider must put on a mask and sterile gloves. ? The skin over your port is cleaned carefully with an antiseptic and allowed to dry. ? The port is gently pinched between sterile gloves, and a needle is inserted into the port.  Only "non-coring" port needles should be used to access the port. Once the port is accessed, a blood return should be checked. This helps ensure that the port  is in the vein and is not clogged.  If your port needs to remain accessed for a constant infusion, a clear (transparent) bandage will be placed over the needle site. The bandage and needle will need to be changed every week, or as directed by your health care provider.  Keep the bandage covering the needle clean and dry. Do not get it wet. Follow your health care provider's instructions on how to take a shower or bath while the port is accessed.  If your port does not need to stay accessed, no bandage is needed over the port.  What is flushing? Flushing helps keep the port from getting clogged. Follow your health care provider's instructions on how and when to flush the port. Ports are usually flushed with saline solution or a medicine called heparin. The need for flushing will depend on how the port is used.  If the port is used for intermittent medicines or blood draws, the port will need to be flushed: ? After medicines have been given. ? After blood has been drawn. ? As part of routine maintenance.  If a constant infusion is running, the port may not need to be flushed.  How long will my port stay implanted? The port can stay in for as long as your health care provider thinks it is needed. When it is time for the port to come out, surgery will be   done to remove it. The procedure is similar to the one performed when the port was put in. When should I seek immediate medical care? When you have an implanted port, you should seek immediate medical care if:  You notice a bad smell coming from the incision site.  You have swelling, redness, or drainage at the incision site.  You have more swelling or pain at the port site or the surrounding area.  You have a fever that is not controlled with medicine.  This information is not intended to replace advice given to you by your health care provider. Make sure you discuss any questions you have with your health care provider. Document  Released: 12/14/2005 Document Revised: 05/21/2016 Document Reviewed: 08/21/2013 Elsevier Interactive Patient Education  2017 [employer]  

## 2017-04-05 ENCOUNTER — Telehealth: Payer: Self-pay

## 2017-04-05 MED FILL — oxyCODONE HCL 10 MG TABS: 10 | 23 days supply | Qty: 90 | Fill #0

## 2017-04-05 NOTE — Telephone Encounter (Signed)
TC to  Client to  Cancel visit  BY HOPES  Team today,  SW sick.  Client okay  States he is  Fine  And we can visit at any time . Will call to reset visit  For next week. Ask  Client to call if he needs anything . Had  Several questions about his  Housing will let  SW know  His concerns.

## 2017-04-05 NOTE — Congregational Nurse Program (Signed)
Congregational Nurse Program Note  Date of Encounter: 03/20/2017  Past Medical History: Past Medical History:  Diagnosis Date  . Malignant neoplasm of abdomen ([place])    [name] 01/11/2017  . Microcytic anemia    /notes 01/11/2017    Encounter Details:     CNP Questionnaire - 04/05/17 2237      Patient Demographics   Is this a new or existing patient? Existing   Race African-American/Black     Patient Assistance   Location of Patient Assistance Not Applicable   Patient's financial/insurance status Medicaid   Uninsured Patient ([place]ange Card/Care Connects) No   Patient referred to apply for the following financial assistance Medicaid   Food insecurities addressed Not Applicable   Educational health offerings Medications;Navigating the healthcare system;Cancer     Encounter Details   Primary purpose of visit Education/Health Concerns;Navigating the Healthcare System;Chronic Illness/Condition Visit   Was an Emergency Department visit averted? Not Applicable   Patient referred to Follow up with established PCP   Does patient have dental issues? No   Does patient have vision issues? No   Does your patient have an abnormal blood pressure today? No   Since previous encounter, have you referred patient for abnormal blood pressure that resulted in a new diagnosis or medication change? No   Does your patient have an abnormal blood glucose today? No   Since previous encounter, have you referred patient for abnormal blood glucose that resulted in a new diagnosis or medication change? No   Was there a life-saving intervention made? No    HOPES team viist  To follow up on client and his  Cancer treatments. Client states he is  Doing  Fine ,still up every day  And moving on. Client has his  medicaid  c,team looked at his  documents .Marland Kitchen  Client is  Anxious  About  Finding him a place of his own ,has completed several applications  And  Fills he  Will be moving into  [place]. This place has on  site  Laundry ,he will have to  . States  [place] may  Help with his  lights  Being turned on. His  Daughter came over today to  meet the HOPES  Team. She is  Very  Concerned about her  Father and very supportive . He  Depends on her to handle a lot of his  affairs  And t  he is very involved and voices his  opinions  very strongly..States he is  eating well no problems , taking his  Medications  No problem ,pain is about  A 5 but  Deals with it.Marland KitchenMarland KitchenPain is about a 5 on a scale of 10. Nurse will look into application for  Furniture from [place]  For client  Client is  So ready to  Get his own place  ,team cautioned him to  Slow  Down to make  sure everything is in place and that the HOPES team  will ask for an extension of his  Stay at the [place] to assure all paper work is  in   and that is move is firmly in place for him. Client seemed  relieved with that  As he thought he needed to be out  by 03-29-17.  Next  visit planned for 03-24-17.  Client to call if  Problems.

## 2017-04-05 NOTE — Congregational Nurse Program (Signed)
Congregational Nurse Program Note  Date of Encounter: 04/02/2017  Past Medical History: Past Medical History:  Diagnosis Date  . Malignant neoplasm of abdomen ([place])    [name] 01/11/2017  . Microcytic anemia    [name] 01/11/2017    Encounter Details:     CNP Questionnaire - 04/05/17 2308      Patient Demographics   Is this a new or existing patient? Existing   Patient is considered a/an Not Applicable   Race African-American/Black     Patient Assistance   Location of Patient Assistance Not Applicable   Patient's financial/insurance status Medicaid   Patient referred to apply for the following financial assistance Medicaid   Food insecurities addressed Not Applicable   Assistance securing medications No   Educational health offerings Medications     Encounter Details   Primary purpose of visit Spiritual Care/Support Visit;Education/Health Concerns;Chronic Illness/Condition Visit   Was an Emergency Department visit averted? Not Applicable   Does patient have a medical provider? Yes   Patient referred to Follow up with established PCP   Was a mental health screening completed? (GAINS tool) No   Does patient have dental issues? No   Does patient have vision issues? No   Does your patient have an abnormal blood pressure today? No   Since previous encounter, have you referred patient for abnormal blood pressure that resulted in a new diagnosis or medication change? No   Does your patient have an abnormal blood glucose today? No   Since previous encounter, have you referred patient for abnormal blood glucose that resulted in a new diagnosis or medication change? No   Was there a life-saving intervention made? No     [facility] team visit. Client states he is only  Going to get  $500 for his  Disability and it will really  Be  Tough trying to pay his  Rent and lights . He is still feeling that [facility] is his  Best  Bet and has paid his application fee and thought he was ready to move  in  But now  Realizes he is on a waiting list ,confirmed by [facility] SW. Marland Kitchen He completed a [employer] and  Realizes he is also on that [employer]. We explored other options as rooming  House and moving in with a friend. He will continue to look at all options. Not  Sure when his  Treatments  will end  hoping  treatments  are shrinking his cancer . X ray 04-12-17  to determine. No  Major  Problems  With medications just  Deals with  Them . Itching  From his  Xarelto ,has cream for it  And uses it . Has food and personal items  But  Needs another bus  Ticket to get to his appointments if possible. Team will request bus  Pass and SW to look at  Housing again. [place] application on hold until we determine where client will be  Going and when. Client was very disappointed that his  Move  May not  Move as quickly as he thought . Prefers his own place but is open.  Client will call team if  He needs to ,next visit 04-07-17.

## 2017-04-06 ENCOUNTER — Encounter: Payer: Self-pay | Admitting: *Deleted

## 2017-04-06 NOTE — Progress Notes (Signed)
[place] [employer]  [employer] representative spoke with patients daughter regarding housing.  Patient is still on multiple waiting list and has limited options due to SSI income.  CSW spoke with [facility] and confirmed that once patient establishes housing and is no longer receiving assistance from the [place] project his SSI will increase to $750.  This along with the "back pay" he received from [place] should increase housing opportunities.  Patients daughter plans to take this information and revisit/explore additional housing options.  CSW encouraged patients daughter to call with questions or concerns.      Johnnye Lana, MSW, LCSW, OSW-C Clinical Social Worker [facility] [phone]

## 2017-04-09 ENCOUNTER — Ambulatory Visit (HOSPITAL_COMMUNITY)
Admission: RE | Admit: 2017-04-09 | Discharge: 2017-04-09 | Disposition: A | Discharge status: Home / Self Care | Payer: Medicaid Other | Source: Ambulatory Visit | Attending: Oncology | Admitting: Oncology

## 2017-04-09 ENCOUNTER — Encounter (HOSPITAL_COMMUNITY): Payer: Self-pay

## 2017-04-09 DIAGNOSIS — C186 Malignant neoplasm of descending colon: Secondary | ICD-10-CM | POA: Diagnosis present

## 2017-04-09 DIAGNOSIS — N62 Hypertrophy of breast: Secondary | ICD-10-CM | POA: Insufficient documentation

## 2017-04-09 DIAGNOSIS — I700 Atherosclerosis of aorta: Secondary | ICD-10-CM | POA: Diagnosis not present

## 2017-04-09 DIAGNOSIS — R590 Localized enlarged lymph nodes: Secondary | ICD-10-CM | POA: Diagnosis not present

## 2017-04-09 MED ORDER — HEPARIN SOD (PORK) LOCK FLUSH 100 UNIT/ML IV SOLN
500.0000 [IU] | Freq: Once | INTRAVENOUS | Status: AC
  Administered 2017-04-09: 500 [IU] via INTRAVENOUS

## 2017-04-09 MED ORDER — IOPAMIDOL (ISOVUE-300) INJECTION 61%
100.0000 mL | Freq: Once | INTRAVENOUS | Status: AC | PRN
  Administered 2017-04-09: 100 mL via INTRAVENOUS

## 2017-04-09 MED ORDER — IOPAMIDOL (ISOVUE-300) INJECTION 61%: 30.0000 mL | Freq: Once | INTRAVENOUS | Status: DC | PRN

## 2017-04-09 MED ORDER — IOPAMIDOL (ISOVUE-300) INJECTION 61%
INTRAVENOUS | Status: AC
  Filled 2017-04-09: qty 100

## 2017-04-09 MED ORDER — HEPARIN SOD (PORK) LOCK FLUSH 100 UNIT/ML IV SOLN
INTRAVENOUS | Status: AC
  Filled 2017-04-09: qty 5

## 2017-04-12 ENCOUNTER — Ambulatory Visit (HOSPITAL_BASED_OUTPATIENT_CLINIC_OR_DEPARTMENT_OTHER): Payer: Medicaid Other | Admitting: Hematology

## 2017-04-12 ENCOUNTER — Telehealth: Payer: Self-pay | Admitting: Hematology

## 2017-04-12 ENCOUNTER — Other Ambulatory Visit: Payer: Medicaid Other

## 2017-04-12 ENCOUNTER — Ambulatory Visit: Payer: Medicaid Other | Admitting: Hematology

## 2017-04-12 ENCOUNTER — Other Ambulatory Visit (HOSPITAL_BASED_OUTPATIENT_CLINIC_OR_DEPARTMENT_OTHER): Payer: Medicaid Other

## 2017-04-12 ENCOUNTER — Encounter: Payer: Self-pay | Admitting: Hematology

## 2017-04-12 VITALS — BP 117/79 | HR 85 | Temp 98.5°F | Resp 18 | Ht 69.0 in | Wt 145.5 lb

## 2017-04-12 DIAGNOSIS — M25569 Pain in unspecified knee: Secondary | ICD-10-CM

## 2017-04-12 DIAGNOSIS — C186 Malignant neoplasm of descending colon: Secondary | ICD-10-CM

## 2017-04-12 DIAGNOSIS — R630 Anorexia: Secondary | ICD-10-CM | POA: Diagnosis not present

## 2017-04-12 DIAGNOSIS — E46 Unspecified protein-calorie malnutrition: Secondary | ICD-10-CM | POA: Diagnosis not present

## 2017-04-12 DIAGNOSIS — I82411 Acute embolism and thrombosis of right femoral vein: Secondary | ICD-10-CM

## 2017-04-12 DIAGNOSIS — R109 Unspecified abdominal pain: Secondary | ICD-10-CM | POA: Diagnosis not present

## 2017-04-12 DIAGNOSIS — D630 Anemia in neoplastic disease: Secondary | ICD-10-CM | POA: Diagnosis not present

## 2017-04-12 DIAGNOSIS — L270 Generalized skin eruption due to drugs and medicaments taken internally: Secondary | ICD-10-CM

## 2017-04-12 DIAGNOSIS — C786 Secondary malignant neoplasm of retroperitoneum and peritoneum: Secondary | ICD-10-CM

## 2017-04-12 DIAGNOSIS — Z86718 Personal history of other venous thrombosis and embolism: Secondary | ICD-10-CM | POA: Diagnosis not present

## 2017-04-12 DIAGNOSIS — D500 Iron deficiency anemia secondary to blood loss (chronic): Secondary | ICD-10-CM

## 2017-04-12 DIAGNOSIS — C762 Malignant neoplasm of abdomen: Secondary | ICD-10-CM

## 2017-04-12 LAB — CBC WITH DIFFERENTIAL/PLATELET
BASO%: 1.4 % (ref 0.0–2.0)
Basophils Absolute: 0.1 10*3/uL (ref 0.0–0.1)
EOS ABS: 0.1 10*3/uL (ref 0.0–0.5)
EOS%: 1.9 % (ref 0.0–7.0)
HCT: 34 % — ABNORMAL LOW (ref 38.4–49.9)
HEMOGLOBIN: 11.1 g/dL — AB (ref 13.0–17.1)
LYMPH%: 32.7 % (ref 14.0–49.0)
MCH: 28.1 pg (ref 27.2–33.4)
MCHC: 32.6 g/dL (ref 32.0–36.0)
MCV: 86.3 fL (ref 79.3–98.0)
MONO#: 1 10*3/uL — AB (ref 0.1–0.9)
MONO%: 17.2 % — ABNORMAL HIGH (ref 0.0–14.0)
NEUT%: 46.8 % (ref 39.0–75.0)
NEUTROS ABS: 2.7 10*3/uL (ref 1.5–6.5)
PLATELETS: 123 10*3/uL — AB (ref 140–400)
RBC: 3.95 10*6/uL — AB (ref 4.20–5.82)
RDW: 28.5 % — AB (ref 11.0–14.6)
WBC: 5.8 10*3/uL (ref 4.0–10.3)
lymph#: 1.9 10*3/uL (ref 0.9–3.3)

## 2017-04-12 LAB — COMPREHENSIVE METABOLIC PANEL
ALT: 11 U/L (ref 0–55)
ANION GAP: 10 meq/L (ref 3–11)
AST: 28 U/L (ref 5–34)
Albumin: 3.3 g/dL — ABNORMAL LOW (ref 3.5–5.0)
Alkaline Phosphatase: 93 U/L (ref 40–150)
BILIRUBIN TOTAL: 0.29 mg/dL (ref 0.20–1.20)
BUN: 6.5 mg/dL — ABNORMAL LOW (ref 7.0–26.0)
CO2: 24 meq/L (ref 22–29)
CREATININE: 0.8 mg/dL (ref 0.7–1.3)
Calcium: 9.2 mg/dL (ref 8.4–10.4)
Chloride: 105 mEq/L (ref 98–109)
GLUCOSE: 91 mg/dL (ref 70–140)
Potassium: 3.9 mEq/L (ref 3.5–5.1)
Sodium: 139 mEq/L (ref 136–145)
TOTAL PROTEIN: 8.1 g/dL (ref 6.4–8.3)

## 2017-04-12 MED ORDER — HYDROCORTISONE 2.5 % EX CREA: TOPICAL_CREAM | Freq: Two times a day (BID) | CUTANEOUS | 3 refills | Status: DC

## 2017-04-12 MED ORDER — CLINDAMYCIN PHOSPHATE 1 % EX GEL: Freq: Two times a day (BID) | CUTANEOUS | 1 refills | Status: DC

## 2017-04-12 MED FILL — HYDROCORTISONE 2.5% CREAM: 2.5 | 30 days supply | Qty: 30 | Fill #0

## 2017-04-12 NOTE — Telephone Encounter (Signed)
Appointments scheduled per 4.16.18 LOS. Patient given AVS report and calendars with future scheduled appointments. °

## 2017-04-12 NOTE — Telephone Encounter (Signed)
Patient called and needs to reschedule his appointment for day to another day
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
## 2017-04-13 ENCOUNTER — Ambulatory Visit (HOSPITAL_BASED_OUTPATIENT_CLINIC_OR_DEPARTMENT_OTHER): Payer: Medicaid Other

## 2017-04-13 ENCOUNTER — Ambulatory Visit: Payer: Medicaid Other

## 2017-04-13 DIAGNOSIS — C186 Malignant neoplasm of descending colon: Secondary | ICD-10-CM | POA: Diagnosis not present

## 2017-04-13 DIAGNOSIS — Z5111 Encounter for antineoplastic chemotherapy: Secondary | ICD-10-CM | POA: Diagnosis not present

## 2017-04-13 DIAGNOSIS — C786 Secondary malignant neoplasm of retroperitoneum and peritoneum: Secondary | ICD-10-CM | POA: Diagnosis not present

## 2017-04-13 DIAGNOSIS — Z5112 Encounter for antineoplastic immunotherapy: Secondary | ICD-10-CM | POA: Diagnosis present

## 2017-04-13 MED ORDER — SODIUM CHLORIDE 0.9 % IV SOLN
Freq: Once | INTRAVENOUS | Status: AC
  Administered 2017-04-13: 10:00:00 via INTRAVENOUS

## 2017-04-13 MED ORDER — FLUOROURACIL CHEMO INJECTION 5 GM/100ML
2400.0000 mg/m2 | INTRAVENOUS | Status: AC
  Administered 2017-04-13: 4200 mg via INTRAVENOUS
  Filled 2017-04-13: qty 84

## 2017-04-13 MED ORDER — DEXTROSE 5 % IV SOLN
Freq: Once | INTRAVENOUS | Status: AC
Start: 2017-04-13 — End: 2017-04-13
  Administered 2017-04-13: 11:00:00 via INTRAVENOUS

## 2017-04-13 MED ORDER — PALONOSETRON HCL INJECTION 0.25 MG/5ML
0.2500 mg | Freq: Once | INTRAVENOUS | Status: AC
Start: 2017-04-13 — End: 2017-04-13
  Administered 2017-04-13: 0.25 mg via INTRAVENOUS

## 2017-04-13 MED ORDER — OXALIPLATIN CHEMO INJECTION 100 MG/20ML
85.0000 mg/m2 | Freq: Once | INTRAVENOUS | Status: AC
  Administered 2017-04-13: 150 mg via INTRAVENOUS
  Filled 2017-04-13: qty 20

## 2017-04-13 MED ORDER — PALONOSETRON HCL INJECTION 0.25 MG/5ML
INTRAVENOUS | Status: AC
  Filled 2017-04-13: qty 5

## 2017-04-13 MED ORDER — SODIUM CHLORIDE 0.9 % IV SOLN
6.0000 mg/kg | Freq: Once | INTRAVENOUS | Status: AC
  Administered 2017-04-13: 360 mg via INTRAVENOUS
  Filled 2017-04-13: qty 18

## 2017-04-13 MED ORDER — DEXAMETHASONE SODIUM PHOSPHATE 10 MG/ML IJ SOLN
10.0000 mg | Freq: Once | INTRAMUSCULAR | Status: AC
  Administered 2017-04-13: 10 mg via INTRAVENOUS

## 2017-04-13 MED ORDER — DEXAMETHASONE SODIUM PHOSPHATE 10 MG/ML IJ SOLN
INTRAMUSCULAR | Status: AC
  Filled 2017-04-13: qty 1

## 2017-04-13 MED ORDER — DEXTROSE 5 % IV SOLN
398.0000 mg/m2 | Freq: Once | INTRAVENOUS | Status: AC
  Administered 2017-04-13: 700 mg via INTRAVENOUS
  Filled 2017-04-13: qty 35

## 2017-04-13 MED FILL — CLINDAMYCIN PH 1% GEL: 1 | 30 days supply | Qty: 30 | Fill #0

## 2017-04-13 NOTE — Patient Instructions (Signed)
[facility] Discharge Instructions for Patients Receiving Chemotherapy  Today you received the following chemotherapy agents: Vectibix, Oxaliplatin, Leucovorin and Fluorouracil.  To help prevent nausea and vomiting after your treatment, we encourage you to take your nausea medication: Compazine. Take one every 6 hours as needed. For nausea on or after 4/20, take Zofran. If you develop nausea and vomiting that is not controlled by your nausea medication, call the clinic.   BELOW ARE SYMPTOMS THAT SHOULD BE REPORTED IMMEDIATELY:  *FEVER GREATER THAN 100.5 F  *CHILLS WITH OR WITHOUT FEVER  NAUSEA AND VOMITING THAT IS NOT CONTROLLED WITH YOUR NAUSEA MEDICATION  *UNUSUAL SHORTNESS OF BREATH  *UNUSUAL BRUISING OR BLEEDING  TENDERNESS IN MOUTH AND THROAT WITH OR WITHOUT PRESENCE OF ULCERS  *URINARY PROBLEMS  *BOWEL PROBLEMS  UNUSUAL RASH Items with * indicate a potential emergency and should be followed up as soon as possible.  Feel free to call the clinic should you have any questions or concerns. The clinic phone number is [phone].  Please show the [facility] at check-in to the Emergency Department and triage nurse.

## 2017-04-13 NOTE — Congregational Nurse Program (Signed)
Congregational Nurse Program Note  Date of Encounter: 04/07/2017  Past Medical History: Past Medical History:  Diagnosis Date  . Malignant neoplasm of abdomen ([place])    [name] 01/11/2017  . Microcytic anemia    [name] 01/11/2017    Encounter Details:     CNP Questionnaire - 04/13/17 2352      Patient Demographics   Is this a new or existing patient? Existing   Patient is considered a/an Not Applicable     Patient Assistance   Location of Patient Assistance Not Applicable   Patient's financial/insurance status Medicaid   Uninsured Patient ([place] [employer] Connects) No   Patient referred to apply for the following financial assistance Not Applicable   Food insecurities addressed Not Applicable   Transportation assistance Yes   Type of Assistance Bus Pass Given   Assistance securing medications No   Educational health offerings Cancer;Medications;Nutrition     Encounter Details   Primary purpose of visit Education/Health Concerns;Spiritual Care/Support Visit;Post PCP Visit   Was an Emergency Department visit averted? Not Applicable   Does patient have a medical provider? Yes   Patient referred to Follow up with established PCP   Was a mental health screening completed? (GAINS tool) No   Does patient have vision issues? No   Does your patient have an abnormal blood pressure today? No   Since previous encounter, have you referred patient for abnormal blood pressure that resulted in a new diagnosis or medication change? No   Does your patient have an abnormal blood glucose today? No   Since previous encounter, have you referred patient for abnormal blood glucose that resulted in a new diagnosis or medication change? No   Was there a life-saving intervention made? No      Joint visit  by [facility] team . Client was cooking his  [place] ,in good  [place],states he has still been looking for places to stay and has had a good week ,no more nosebleeds and itching has gotten better. Will  have  Next week and his MD will be back from being out of town. Client very anxious about  Finding a place and moving,counseled regarding need to find something he can afford and a safe place that he will feel comfortable living in. Still has the option of  Living with a close friend . States he will be getting 500.00 until he moves,once he has to pay rent he will receive $750. Sw and client will continue to look at housing  Issues . Nurse to get bus  Pass for client and get to him asap.Client is  Very independent and stated he doesn't like using money for him to stay here when someone else in worst  Shape than him may need it . Very caring person that isnt use to being helped . Has always done for himself. Team listened and tried to assure client that he is on the program and until he is  Medically  And safely placed not to worry and be so anxious about  Leaving and things not in place for his  Future. Client accepted explanation and thanked team for their help.  Follow  Weekly. Will call if he needs team.

## 2017-04-13 NOTE — Progress Notes (Signed)
Nutrition Follow-up:  Nutrition follow-up completed with patient receiving chemotherapy for colon cancer. Patient is followed by [name]  Patient reports that his appetite is good.  Reports that he typically eats biscuit for breakfast, then has a meat sandwich for lunch and for dinner cooks meat with some vegetables (ie greens, green beans).  Reports that he drinks ensure plus 1 time per day or on most days.   He reports some constipation at times but takes medication which helps.   No other nutrition symptoms reported.   Medications: reviewed  Labs: reviewed  Anthropometrics:   Patient's weight has increased 145 lb 8 oz on 4/16 from 137.2 pounds on March 20.   NUTRITION DIAGNOSIS: Inadequate oral intake improved    INTERVENTION:   Encouraged patient to continue to eat high calorie, high protein foods to promote continued weight gain.   Encouraged patient to continue with oral nutrition supplement    MONITORING, EVALUATION, GOAL: patient will tolerate increased calories and protein to promote weight maintenance/gain   NEXT VISIT: May 15 during infusion  Shanise Balch B. Zenia Resides, [place], [place] Registered Dietitian [phone] (pager)

## 2017-04-14 ENCOUNTER — Other Ambulatory Visit: Payer: Self-pay | Admitting: Hematology

## 2017-04-14 MED ORDER — CLINDAMYCIN PHOS-BENZOYL PEROX 1.2-2.5 % EX GEL: 1.0000 "application " | Freq: Two times a day (BID) | CUTANEOUS | 0 refills | Status: DC

## 2017-04-15 ENCOUNTER — Encounter: Payer: Self-pay | Admitting: *Deleted

## 2017-04-15 ENCOUNTER — Ambulatory Visit (HOSPITAL_BASED_OUTPATIENT_CLINIC_OR_DEPARTMENT_OTHER): Payer: Medicaid Other

## 2017-04-15 VITALS — BP 118/72 | HR 110 | Temp 98.2°F | Resp 18

## 2017-04-15 DIAGNOSIS — C186 Malignant neoplasm of descending colon: Secondary | ICD-10-CM

## 2017-04-15 MED ORDER — SODIUM CHLORIDE 0.9% FLUSH
10.0000 mL | INTRAVENOUS | Status: DC | PRN
  Administered 2017-04-15: 10 mL
  Filled 2017-04-15: qty 10

## 2017-04-15 MED ORDER — HEPARIN SOD (PORK) LOCK FLUSH 100 UNIT/ML IV SOLN
500.0000 [IU] | Freq: Once | INTRAVENOUS | Status: AC | PRN
  Administered 2017-04-15: 500 [IU]
  Filled 2017-04-15: qty 5
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
## 2017-04-15 NOTE — Progress Notes (Signed)
error 

## 2017-04-15 NOTE — Progress Notes (Signed)
[place] Work  Clinical Social Work was referred by need for CSW to follow up. Clinical Social Worker contacted patient via phone to offer support and assess for needs.  Pt reports he has been feeling pretty good and was excited he had gained some weight. He shared the congregational nursing program continues to follow him at the motel and he is still on the waiting list for permanent housing options. Pt reports he is in need of bus passes as he prefers to use the bus as opposed to [employer] or [employer]. CSW educated pt that he could get bus pass from [place] team as needed, but he thought other team was to deliver soon. Pt inquired if he could get another bag of food as he found that helpful and CSW team can provide as needed. He reports he will call to arrange pick up prior to his next appt on May 1 as needed. Pt very appreciative and thankful. CSW team to follow and will see at next appt.     Clinical Social Work interventions: Resource education and assistance  Loren Racer, LCSW, OSW-C Clinical Social Worker [place]  [place] Phone: [phone] Fax: [phone]

## 2017-04-18 ENCOUNTER — Emergency Department (HOSPITAL_COMMUNITY)
Admission: EM | Admit: 2017-04-18 | Discharge: 2017-04-18 | Disposition: A | Discharge status: Home / Self Care | Payer: Medicaid Other | Attending: Emergency Medicine | Admitting: Emergency Medicine

## 2017-04-18 ENCOUNTER — Encounter (HOSPITAL_COMMUNITY): Payer: Self-pay | Admitting: Emergency Medicine

## 2017-04-18 DIAGNOSIS — Z7902 Long term (current) use of antithrombotics/antiplatelets: Secondary | ICD-10-CM | POA: Diagnosis not present

## 2017-04-18 DIAGNOSIS — Z85028 Personal history of other malignant neoplasm of stomach: Secondary | ICD-10-CM | POA: Diagnosis not present

## 2017-04-18 DIAGNOSIS — Z85038 Personal history of other malignant neoplasm of large intestine: Secondary | ICD-10-CM | POA: Insufficient documentation

## 2017-04-18 DIAGNOSIS — R040 Epistaxis: Secondary | ICD-10-CM | POA: Diagnosis present

## 2017-04-18 DIAGNOSIS — Z79899 Other long term (current) drug therapy: Secondary | ICD-10-CM | POA: Insufficient documentation

## 2017-04-18 MED ORDER — FLUTICASONE PROPIONATE 50 MCG/ACT NA SUSP: 1.0000 | Freq: Every day | NASAL | Status: DC

## 2017-04-18 MED ORDER — OXYMETAZOLINE HCL 0.05 % NA SOLN
1.0000 | Freq: Once | NASAL | Status: AC
  Administered 2017-04-18: 1 via NASAL
  Filled 2017-04-18: qty 15

## 2017-04-18 NOTE — Discharge Instructions (Signed)
Please read attached information. If you experience any new or worsening signs or symptoms please return to the emergency room for evaluation. Please follow-up with your primary care provider or specialist as discussed. Please use medication prescribed only as directed and discontinue taking if you have any concerning signs or symptoms.   °

## 2017-04-18 NOTE — ED Triage Notes (Signed)
Patient reports that last night had nose bleed and then about 30 mins ago started having nose bleed again. Patient not currently bleeding at this time.  Patient taking Xeralto.
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
## 2017-04-21 ENCOUNTER — Telehealth: Payer: Self-pay

## 2017-04-21 NOTE — Telephone Encounter (Signed)
[facility]  Team out to  Visit  On 04-20-17 to  Check on client . He  Was not  On his  Room . TC to  Client states he  Had to see to  Getting his  Truck running  And was across town ,forgot  About  Our  visit because we usually  Come in the pm .Nurse had spoken with  Him and reminded him of  Appointment  On Friday  But  He  Forgot . Visit  Arranged for  04-22-17 at 9 am ,he  agrees , SW  To  [employer]. States he  Is  Out  Looking  For  Housing  Also ,but  Doing  Okay.

## 2017-04-26 ENCOUNTER — Telehealth: Payer: Self-pay | Admitting: Hematology

## 2017-04-26 ENCOUNTER — Other Ambulatory Visit (HOSPITAL_BASED_OUTPATIENT_CLINIC_OR_DEPARTMENT_OTHER): Payer: Medicaid Other

## 2017-04-26 ENCOUNTER — Ambulatory Visit (HOSPITAL_BASED_OUTPATIENT_CLINIC_OR_DEPARTMENT_OTHER): Payer: Medicaid Other

## 2017-04-26 ENCOUNTER — Ambulatory Visit (HOSPITAL_BASED_OUTPATIENT_CLINIC_OR_DEPARTMENT_OTHER): Payer: Medicaid Other | Admitting: Hematology

## 2017-04-26 VITALS — BP 109/75 | HR 95 | Temp 99.4°F | Resp 20

## 2017-04-26 DIAGNOSIS — Z86718 Personal history of other venous thrombosis and embolism: Secondary | ICD-10-CM | POA: Diagnosis not present

## 2017-04-26 DIAGNOSIS — C786 Secondary malignant neoplasm of retroperitoneum and peritoneum: Secondary | ICD-10-CM

## 2017-04-26 DIAGNOSIS — D630 Anemia in neoplastic disease: Secondary | ICD-10-CM

## 2017-04-26 DIAGNOSIS — R21 Rash and other nonspecific skin eruption: Secondary | ICD-10-CM | POA: Diagnosis not present

## 2017-04-26 DIAGNOSIS — C186 Malignant neoplasm of descending colon: Secondary | ICD-10-CM

## 2017-04-26 DIAGNOSIS — Z5111 Encounter for antineoplastic chemotherapy: Secondary | ICD-10-CM

## 2017-04-26 DIAGNOSIS — Z5112 Encounter for antineoplastic immunotherapy: Secondary | ICD-10-CM

## 2017-04-26 DIAGNOSIS — Z7901 Long term (current) use of anticoagulants: Secondary | ICD-10-CM | POA: Diagnosis not present

## 2017-04-26 DIAGNOSIS — C762 Malignant neoplasm of abdomen: Secondary | ICD-10-CM

## 2017-04-26 LAB — CBC WITH DIFFERENTIAL/PLATELET
BASO%: 1.1 % (ref 0.0–2.0)
Basophils Absolute: 0.1 10*3/uL (ref 0.0–0.1)
EOS%: 1.4 % (ref 0.0–7.0)
Eosinophils Absolute: 0.1 10*3/uL (ref 0.0–0.5)
HCT: 34.5 % — ABNORMAL LOW (ref 38.4–49.9)
HEMOGLOBIN: 11.3 g/dL — AB (ref 13.0–17.1)
LYMPH%: 24.2 % (ref 14.0–49.0)
MCH: 28.5 pg (ref 27.2–33.4)
MCHC: 32.7 g/dL (ref 32.0–36.0)
MCV: 87.2 fL (ref 79.3–98.0)
MONO#: 1.1 10*3/uL — ABNORMAL HIGH (ref 0.1–0.9)
MONO%: 17.2 % — AB (ref 0.0–14.0)
NEUT%: 56.1 % (ref 39.0–75.0)
NEUTROS ABS: 3.6 10*3/uL (ref 1.5–6.5)
Platelets: 170 10*3/uL (ref 140–400)
RBC: 3.95 10*6/uL — AB (ref 4.20–5.82)
RDW: 23 % — AB (ref 11.0–14.6)
WBC: 6.4 10*3/uL (ref 4.0–10.3)
lymph#: 1.6 10*3/uL (ref 0.9–3.3)

## 2017-04-26 LAB — MAGNESIUM: Magnesium: 1.7 mg/dl (ref 1.5–2.5)

## 2017-04-26 LAB — COMPREHENSIVE METABOLIC PANEL
ALBUMIN: 3 g/dL — AB (ref 3.5–5.0)
ALK PHOS: 96 U/L (ref 40–150)
ALT: 7 U/L (ref 0–55)
AST: 22 U/L (ref 5–34)
Anion Gap: 11 mEq/L (ref 3–11)
BILIRUBIN TOTAL: 0.22 mg/dL (ref 0.20–1.20)
BUN: 6.8 mg/dL — AB (ref 7.0–26.0)
CO2: 22 meq/L (ref 22–29)
Calcium: 9.3 mg/dL (ref 8.4–10.4)
Chloride: 104 mEq/L (ref 98–109)
Creatinine: 0.8 mg/dL (ref 0.7–1.3)
GLUCOSE: 90 mg/dL (ref 70–140)
POTASSIUM: 3.9 meq/L (ref 3.5–5.1)
SODIUM: 136 meq/L (ref 136–145)
TOTAL PROTEIN: 8.8 g/dL — AB (ref 6.4–8.3)

## 2017-04-26 LAB — CEA (IN HOUSE-CHCC): CEA (CHCC-IN HOUSE): 3.37 ng/mL (ref 0.00–5.00)

## 2017-04-26 LAB — FERRITIN: Ferritin: 647 ng/ml — ABNORMAL HIGH (ref 22–316)

## 2017-04-26 MED ORDER — SODIUM CHLORIDE 0.9 % IV SOLN
400.0000 mg | Freq: Once | INTRAVENOUS | Status: AC
  Administered 2017-04-26: 400 mg via INTRAVENOUS
  Filled 2017-04-26: qty 20

## 2017-04-26 MED ORDER — DEXTROSE 5 % IV SOLN
Freq: Once | INTRAVENOUS | Status: AC
  Administered 2017-04-26: 12:00:00 via INTRAVENOUS

## 2017-04-26 MED ORDER — SODIUM CHLORIDE 0.9 % IV SOLN
2400.0000 mg/m2 | INTRAVENOUS | Status: DC
  Administered 2017-04-26: 4200 mg via INTRAVENOUS
  Filled 2017-04-26: qty 84

## 2017-04-26 MED ORDER — OXALIPLATIN CHEMO INJECTION 100 MG/20ML
85.0000 mg/m2 | Freq: Once | INTRAVENOUS | Status: AC
  Administered 2017-04-26: 150 mg via INTRAVENOUS
  Filled 2017-04-26: qty 20

## 2017-04-26 MED ORDER — DEXAMETHASONE SODIUM PHOSPHATE 10 MG/ML IJ SOLN
INTRAMUSCULAR | Status: AC
  Filled 2017-04-26: qty 1

## 2017-04-26 MED ORDER — PROCHLORPERAZINE MALEATE 10 MG PO TABS: 10.0000 mg | ORAL_TABLET | Freq: Four times a day (QID) | ORAL | 1 refills | Status: DC | PRN

## 2017-04-26 MED ORDER — LEUCOVORIN CALCIUM INJECTION 350 MG
700.0000 mg | Freq: Once | INTRAMUSCULAR | Status: AC
  Administered 2017-04-26: 700 mg via INTRAVENOUS
  Filled 2017-04-26: qty 35

## 2017-04-26 MED ORDER — DEXAMETHASONE SODIUM PHOSPHATE 10 MG/ML IJ SOLN
10.0000 mg | Freq: Once | INTRAMUSCULAR | Status: AC
  Administered 2017-04-26: 10 mg via INTRAVENOUS

## 2017-04-26 MED ORDER — PALONOSETRON HCL INJECTION 0.25 MG/5ML
0.2500 mg | Freq: Once | INTRAVENOUS | Status: AC
  Administered 2017-04-26: 0.25 mg via INTRAVENOUS

## 2017-04-26 MED ORDER — RIVAROXABAN 20 MG PO TABS: 20.0000 mg | ORAL_TABLET | Freq: Every day | ORAL | 3 refills | Status: DC

## 2017-04-26 MED ORDER — SODIUM CHLORIDE 0.9 % IV SOLN
Freq: Once | INTRAVENOUS | Status: AC
  Administered 2017-04-26: 10:00:00 via INTRAVENOUS

## 2017-04-26 MED ORDER — ONDANSETRON HCL 8 MG PO TABS: 8.0000 mg | ORAL_TABLET | Freq: Two times a day (BID) | ORAL | 1 refills | Status: DC | PRN

## 2017-04-26 MED ORDER — OXYCODONE HCL 10 MG PO TABS: 10.0000 mg | ORAL_TABLET | Freq: Four times a day (QID) | ORAL | 0 refills | Status: DC | PRN

## 2017-04-26 MED ORDER — PALONOSETRON HCL INJECTION 0.25 MG/5ML
INTRAVENOUS | Status: AC
  Filled 2017-04-26: qty 5

## 2017-04-26 MED FILL — ONDANSETRON HCL 8 MG TABLET: 8 | 15 days supply | Qty: 30 | Fill #0

## 2017-04-26 MED FILL — XARELTO 20 MG TABLET: 20 | 30 days supply | Qty: 30 | Fill #0

## 2017-04-26 MED FILL — PROCHLORPERAZINE 10 MG TAB: 10 | 7 days supply | Qty: 30 | Fill #0

## 2017-04-26 NOTE — Telephone Encounter (Signed)
Gave patient avs report and appointments for May and June.  °
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
## 2017-04-26 NOTE — Patient Instructions (Signed)
[facility] Discharge Instructions for Patients Receiving Chemotherapy  Today you received the following chemotherapy agents Oxaliplatin, Leucovorin, Adrucil, Vectibix  To help prevent nausea and vomiting after your treatment, we encourage you to take your nausea medication    If you develop nausea and vomiting that is not controlled by your nausea medication, call the clinic.   BELOW ARE SYMPTOMS THAT SHOULD BE REPORTED IMMEDIATELY:  *FEVER GREATER THAN 100.5 F  *CHILLS WITH OR WITHOUT FEVER  NAUSEA AND VOMITING THAT IS NOT CONTROLLED WITH YOUR NAUSEA MEDICATION  *UNUSUAL SHORTNESS OF BREATH  *UNUSUAL BRUISING OR BLEEDING  TENDERNESS IN MOUTH AND THROAT WITH OR WITHOUT PRESENCE OF ULCERS  *URINARY PROBLEMS  *BOWEL PROBLEMS  UNUSUAL RASH Items with * indicate a potential emergency and should be followed up as soon as possible.  Feel free to call the clinic you have any questions or concerns. The clinic phone number is [phone].  Please show the [place] at check-in to the Emergency Department and triage nurse.

## 2017-04-27 ENCOUNTER — Other Ambulatory Visit: Payer: Medicaid Other

## 2017-04-27 ENCOUNTER — Telehealth: Payer: Self-pay

## 2017-04-27 ENCOUNTER — Ambulatory Visit: Payer: Medicaid Other | Admitting: Hematology

## 2017-04-27 ENCOUNTER — Encounter: Payer: Self-pay | Admitting: Hematology

## 2017-04-27 ENCOUNTER — Ambulatory Visit: Payer: Medicaid Other

## 2017-04-27 NOTE — Telephone Encounter (Signed)
HOPES  Team out to see client ,Not  Available. TC to  Client  States he  Was called in to hospital for  Treatment today but  Schedule  Had him for  Tuesday . States he is  fine but  Missed our  Visit . Will  Continue to  Follow  Up on his  Disability amount  For  Housing . He  Will cal  If he  Needs anything from team . Planned visit with  Client for next week Tuesday @ 11 am but  Will remind  Client.

## 2017-04-27 NOTE — Congregational Nurse Program (Signed)
Congregational Nurse Program Note  Date of Encounter: 04/21/2017  Past Medical History: Past Medical History:  Diagnosis Date  . 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 Microcytic anemia    [name] 01/11/2017    Encounter Details:     CNP Questionnaire - 04/27/17 1900      Patient Demographics   Is this a new or existing patient? Existing   Patient is considered a/an Not Applicable   Race African-American/Black     Patient Assistance   Location of Patient Assistance Not Applicable   Patient's financial/insurance status Medicaid   Uninsured Patient ([place] Card/Care Connects) No   Patient referred to apply for the following financial assistance Medicaid   Food insecurities addressed Not Applicable   Transportation assistance Yes   Type of Assistance Bus Pass Given   Assistance securing medications No   Educational health offerings Cancer     Encounter Details   Primary purpose of visit Education/Health Concerns;Chronic Illness/Condition Visit;Spiritual Care/Support Visit   Was an Emergency Department visit averted? Not Applicable   Does patient have a medical provider? Yes   Patient referred to Follow up with established PCP   Was a mental health screening completed? (GAINS tool) No   Does patient have dental issues? No   Does patient have vision issues? No   Does your patient have an abnormal blood pressure today? No   Since previous encounter, have you referred patient for abnormal blood pressure that resulted in a new diagnosis or medication change? No   Does your patient have an abnormal blood glucose today? No   Was there a life-saving intervention made? No     H[facility]ES  Team visited to  [place] as other worker is going out on leave. Receptive to new team ,states he was seen in ED  Yesterday  For  Nose bleed as it  Reoccurred. Stated they took him straight in once he presented his  Caner card. Doing  Fine now  No  More bleeds since then.  Nurse informed him that he has an extension in the program until 5-25-.18 . Housing needing a support letter to say once he  Gets his  Own place  Disability check will go up  So  SW will be  Trying to  Get information from disability  To  verify.  Continues to look for affordable  Housing . Client gave [place] his  New  Cancer schedule for May. His  MD  Back  From vacation ,got his  Truck repaired. Bus  Pass given . Will check on [employer] .Will let  Clients  Daughter know  He has a new  SW HOPES team member. Feels  Treatments  Have  Definitely  Helped  Him ,he  Feels  Much  Better and  Pleased with  Care he is  Now  receiving. Nex t visit 04-26-17 @ 9:30 am . Client agreed.

## 2017-04-28 ENCOUNTER — Ambulatory Visit (HOSPITAL_BASED_OUTPATIENT_CLINIC_OR_DEPARTMENT_OTHER): Payer: Medicaid Other

## 2017-04-28 VITALS — BP 96/63 | HR 107 | Temp 98.7°F | Resp 18

## 2017-04-28 DIAGNOSIS — C186 Malignant neoplasm of descending colon: Secondary | ICD-10-CM

## 2017-04-28 DIAGNOSIS — Z452 Encounter for adjustment and management of vascular access device: Secondary | ICD-10-CM | POA: Diagnosis present

## 2017-04-28 MED ORDER — HEPARIN SOD (PORK) LOCK FLUSH 100 UNIT/ML IV SOLN
500.0000 [IU] | Freq: Once | INTRAVENOUS | Status: AC | PRN
  Administered 2017-04-28: 500 [IU]
  Filled 2017-04-28: qty 5

## 2017-04-28 MED ORDER — SODIUM CHLORIDE 0.9% FLUSH
10.0000 mL | INTRAVENOUS | Status: DC | PRN
  Administered 2017-04-28: 10 mL
  Filled 2017-04-28: qty 10

## 2017-05-03 MED FILL — oxyCODONE HCL 10 MG TABS: 10 | 18 days supply | Qty: 70 | Fill #0

## 2017-05-05 NOTE — Congregational Nurse Program (Signed)
Congregational Nurse Program Note  Date of Encounter: 05/05/2017  Past Medical History: Past Medical History:  Diagnosis Date  . 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 Microcytic anemia    [name] 01/11/2017    Encounter Details:     CNP Questionnaire - 05/05/17 1757      Patient Demographics   Is this a new or existing patient? Existing   Patient is considered a/an Not Applicable   Race African-American/Black     Patient Assistance   Location of Patient Assistance Not Applicable   Patient's financial/insurance status Medicaid   Uninsured Patient ([place] [facility]/[employer]) No   Patient referred to apply for the following financial assistance Not Applicable   Food insecurities addressed Not Applicable   Transportation assistance No   Assistance securing medications No   Educational health offerings Cancer;Nutrition;Medications;Chronic disease     Encounter Details   Primary purpose of visit Spiritual Care/Support Visit;Education/Health Concerns;Chronic Illness/Condition Visit;Post PCP Visit   Was an Emergency Department visit averted? Not Applicable   Does patient have a medical provider? Yes   Patient referred to Clinic;Follow up with established PCP   Was a mental health screening completed? (GAINS tool) No   Does patient have dental issues? No   Does patient have vision issues? No   Does your patient have an abnormal blood pressure today? No   Since previous encounter, have you referred patient for abnormal blood pressure that resulted in a new diagnosis or medication change? No   Does your patient have an abnormal blood glucose today? No   Since previous encounter, have you referred patient for abnormal blood glucose that resulted in a new diagnosis or medication change? No   Was there a life-saving intervention made? No    Joint visit by [facility] team. Client states he is doing well ,no reactions to medications ,no other nose bleeds. He continues to  search for affordable housing with the help of his daughter and SW. SW made contact with Disability regarding his income and they verified by phone that once he gets housing and shows proof his money would  Go up to $750 but now he gets $500.00  .Housing that he  has been identifying  is ranging from $400 --up. He will need enough  Money for gas ,utilities and to maintain his  Household  . He does get  food stamps . Mr K never ask  for anything but  can use a gift card as he needs to save up as money as possible to help once he moves . Will request a gift card and extension in [facility] as we are almost at the middle of the month, as we are not  able to identify housing and he continues his weekly treatments at the cancer center.   Next  Visit  05-10-17

## 2017-05-10 ENCOUNTER — Other Ambulatory Visit: Payer: Self-pay | Admitting: Hematology

## 2017-05-11 ENCOUNTER — Ambulatory Visit: Payer: Medicaid Other

## 2017-05-11 ENCOUNTER — Encounter: Payer: Self-pay | Admitting: *Deleted

## 2017-05-11 ENCOUNTER — Ambulatory Visit (HOSPITAL_BASED_OUTPATIENT_CLINIC_OR_DEPARTMENT_OTHER): Payer: Medicaid Other

## 2017-05-11 ENCOUNTER — Other Ambulatory Visit (HOSPITAL_BASED_OUTPATIENT_CLINIC_OR_DEPARTMENT_OTHER): Payer: Medicaid Other

## 2017-05-11 ENCOUNTER — Ambulatory Visit: Payer: Medicaid Other | Admitting: Hematology

## 2017-05-11 VITALS — BP 118/90 | HR 88 | Temp 98.9°F | Resp 18

## 2017-05-11 DIAGNOSIS — C762 Malignant neoplasm of abdomen: Secondary | ICD-10-CM

## 2017-05-11 DIAGNOSIS — D500 Iron deficiency anemia secondary to blood loss (chronic): Secondary | ICD-10-CM

## 2017-05-11 DIAGNOSIS — Z5111 Encounter for antineoplastic chemotherapy: Secondary | ICD-10-CM

## 2017-05-11 DIAGNOSIS — Z452 Encounter for adjustment and management of vascular access device: Secondary | ICD-10-CM

## 2017-05-11 DIAGNOSIS — Z5112 Encounter for antineoplastic immunotherapy: Secondary | ICD-10-CM | POA: Diagnosis not present

## 2017-05-11 DIAGNOSIS — C186 Malignant neoplasm of descending colon: Secondary | ICD-10-CM | POA: Diagnosis not present

## 2017-05-11 DIAGNOSIS — Z95828 Presence of other vascular implants and grafts: Secondary | ICD-10-CM

## 2017-05-11 LAB — COMPREHENSIVE METABOLIC PANEL
ALBUMIN: 3.1 g/dL — AB (ref 3.5–5.0)
ALT: 11 U/L (ref 0–55)
ANION GAP: 9 meq/L (ref 3–11)
AST: 29 U/L (ref 5–34)
Alkaline Phosphatase: 92 U/L (ref 40–150)
BILIRUBIN TOTAL: 0.4 mg/dL (ref 0.20–1.20)
BUN: 4.1 mg/dL — ABNORMAL LOW (ref 7.0–26.0)
CO2: 24 meq/L (ref 22–29)
CREATININE: 0.8 mg/dL (ref 0.7–1.3)
Calcium: 9.2 mg/dL (ref 8.4–10.4)
Chloride: 104 mEq/L (ref 98–109)
GLUCOSE: 101 mg/dL (ref 70–140)
Potassium: 3.7 mEq/L (ref 3.5–5.1)
SODIUM: 137 meq/L (ref 136–145)
TOTAL PROTEIN: 8.7 g/dL — AB (ref 6.4–8.3)

## 2017-05-11 LAB — CBC WITH DIFFERENTIAL/PLATELET
BASO%: 1.4 % (ref 0.0–2.0)
Basophils Absolute: 0.1 10*3/uL (ref 0.0–0.1)
EOS%: 2.3 % (ref 0.0–7.0)
Eosinophils Absolute: 0.1 10*3/uL (ref 0.0–0.5)
HCT: 37.1 % — ABNORMAL LOW (ref 38.4–49.9)
HGB: 11.9 g/dL — ABNORMAL LOW (ref 13.0–17.1)
LYMPH%: 41.6 % (ref 14.0–49.0)
MCH: 28.3 pg (ref 27.2–33.4)
MCHC: 32.1 g/dL (ref 32.0–36.0)
MCV: 88.1 fL (ref 79.3–98.0)
MONO#: 0.8 10*3/uL (ref 0.1–0.9)
MONO%: 15.5 % — ABNORMAL HIGH (ref 0.0–14.0)
NEUT#: 1.9 10*3/uL (ref 1.5–6.5)
NEUT%: 39.2 % (ref 39.0–75.0)
Platelets: 103 10*3/uL — ABNORMAL LOW (ref 140–400)
RBC: 4.21 10*6/uL (ref 4.20–5.82)
RDW: 18.6 % — ABNORMAL HIGH (ref 11.0–14.6)
WBC: 4.8 10*3/uL (ref 4.0–10.3)
lymph#: 2 10*3/uL (ref 0.9–3.3)

## 2017-05-11 LAB — MAGNESIUM: Magnesium: 1.6 mg/dl (ref 1.5–2.5)

## 2017-05-11 MED ORDER — OXALIPLATIN CHEMO INJECTION 100 MG/20ML
85.0000 mg/m2 | Freq: Once | INTRAVENOUS | Status: AC
  Administered 2017-05-11: 150 mg via INTRAVENOUS
  Filled 2017-05-11: qty 10

## 2017-05-11 MED ORDER — DEXAMETHASONE SODIUM PHOSPHATE 10 MG/ML IJ SOLN
10.0000 mg | Freq: Once | INTRAMUSCULAR | Status: AC
  Administered 2017-05-11: 10 mg via INTRAVENOUS

## 2017-05-11 MED ORDER — HEPARIN SOD (PORK) LOCK FLUSH 100 UNIT/ML IV SOLN
500.0000 [IU] | INTRAVENOUS | Status: DC | PRN
  Filled 2017-05-11: qty 5

## 2017-05-11 MED ORDER — SODIUM CHLORIDE 0.9 % IV SOLN
Freq: Once | INTRAVENOUS | Status: AC
Start: 2017-05-11 — End: 2017-05-11
  Administered 2017-05-11: 10:00:00 via INTRAVENOUS

## 2017-05-11 MED ORDER — PALONOSETRON HCL INJECTION 0.25 MG/5ML
0.2500 mg | Freq: Once | INTRAVENOUS | Status: AC
  Administered 2017-05-11: 0.25 mg via INTRAVENOUS

## 2017-05-11 MED ORDER — PALONOSETRON HCL INJECTION 0.25 MG/5ML
INTRAVENOUS | Status: AC
  Filled 2017-05-11: qty 5

## 2017-05-11 MED ORDER — DEXTROSE 5 % IV SOLN
Freq: Once | INTRAVENOUS | Status: AC
  Administered 2017-05-11: 12:00:00 via INTRAVENOUS

## 2017-05-11 MED ORDER — SODIUM CHLORIDE 0.9 % IV SOLN
6.5000 mg/kg | Freq: Once | INTRAVENOUS | Status: AC
  Administered 2017-05-11: 400 mg via INTRAVENOUS
  Filled 2017-05-11: qty 20

## 2017-05-11 MED ORDER — SODIUM CHLORIDE 0.9 % IV SOLN
2400.0000 mg/m2 | INTRAVENOUS | Status: DC
  Administered 2017-05-11: 4200 mg via INTRAVENOUS
  Filled 2017-05-11: qty 84

## 2017-05-11 MED ORDER — DEXAMETHASONE SODIUM PHOSPHATE 10 MG/ML IJ SOLN
INTRAMUSCULAR | Status: AC
  Filled 2017-05-11: qty 1

## 2017-05-11 MED ORDER — LEUCOVORIN CALCIUM INJECTION 350 MG
398.0000 mg/m2 | Freq: Once | INTRAVENOUS | Status: AC
  Administered 2017-05-11: 700 mg via INTRAVENOUS
  Filled 2017-05-11: qty 35

## 2017-05-11 MED ORDER — SODIUM CHLORIDE 0.9% FLUSH
10.0000 mL | INTRAVENOUS | Status: DC | PRN
  Administered 2017-05-11: 10 mL via INTRAVENOUS
  Filled 2017-05-11: qty 10

## 2017-05-11 NOTE — Progress Notes (Signed)
Nutrition Follow-up:  Nutrition follow-up completed with patient receiving chemotherapy for colon cancer.  Patient is followed by [name].  Patient reports appetite is good.  "I am eating and snacking." Patient reports he had a egg and sausage wrap this am before coming to chemo.  Reports last night had fish and potatoes and for lunch yesterday ate chicken sandwich and fries from [employer].  Reports that he drinks ensure 3-4 times per week.      No other nutrition side effects reported today.  Medications: reviewed  Labs: reviewed  Anthropometrics:   Weight taken on 4/22 was 147 lb increased from 145 lb 8 oz on 4/16.   No new weight taken today   NUTRITION DIAGNOSIS: Inadequate oral intake improved    INTERVENTION:   Encouraged patient to continue to eat high calorie, high protein foods for continued weight gain Offered patient 2nd complimentary case of ensure plus but reported that he had some.      MONITORING, EVALUATION, GOAL: patient will tolerate increased calories and protein to promote weight maintenance/gain   NEXT VISIT:  As needed  Jeff [name] Jeff [name], Jeff [name], Jeff [name] Registered Dietitian [phone] (pager)

## 2017-05-11 NOTE — Patient Instructions (Signed)
[facility] Discharge Instructions for Patients Receiving Chemotherapy  Today you received the following chemotherapy agents Oxaliplatin, Leucovorin, Adrucil, Vectibix  To help prevent nausea and vomiting after your treatment, we encourage you to take your nausea medication    If you develop nausea and vomiting that is not controlled by your nausea medication, call the clinic.   BELOW ARE SYMPTOMS THAT SHOULD BE REPORTED IMMEDIATELY:  *FEVER GREATER THAN 100.5 F  *CHILLS WITH OR WITHOUT FEVER  NAUSEA AND VOMITING THAT IS NOT CONTROLLED WITH YOUR NAUSEA MEDICATION  *UNUSUAL SHORTNESS OF BREATH  *UNUSUAL BRUISING OR BLEEDING  TENDERNESS IN MOUTH AND THROAT WITH OR WITHOUT PRESENCE OF ULCERS  *URINARY PROBLEMS  *BOWEL PROBLEMS  UNUSUAL RASH Items with * indicate a potential emergency and should be followed up as soon as possible.  Feel free to call the clinic you have any questions or concerns. The clinic phone number is [phone].  Please show the [place] at check-in to the Emergency Department and triage nurse.

## 2017-05-11 NOTE — Progress Notes (Signed)
[facility] Work  Holiday representative met with patient in the infusion room to follow up regarding housing and offer additional support.  Patient stated he was on multiple waiting list and plans to research additional options this week.  Patient stated the congregational nursing staff and social worker were also assisting him in finding permanent housing.  CSW provided patient with food assistance and encouraged patient to call with needs or concerns.    Johnnye Lana, MSW, LCSW, OSW-C Clinical Social Worker [facility] [phone]

## 2017-05-11 NOTE — Patient Instructions (Signed)

## 2017-05-13 ENCOUNTER — Ambulatory Visit (HOSPITAL_BASED_OUTPATIENT_CLINIC_OR_DEPARTMENT_OTHER): Payer: Medicaid Other

## 2017-05-13 VITALS — BP 98/73 | HR 102 | Temp 98.1°F | Resp 18

## 2017-05-13 DIAGNOSIS — Z452 Encounter for adjustment and management of vascular access device: Secondary | ICD-10-CM

## 2017-05-13 DIAGNOSIS — C186 Malignant neoplasm of descending colon: Secondary | ICD-10-CM | POA: Diagnosis not present

## 2017-05-13 MED ORDER — HEPARIN SOD (PORK) LOCK FLUSH 100 UNIT/ML IV SOLN
500.0000 [IU] | Freq: Once | INTRAVENOUS | Status: AC | PRN
  Administered 2017-05-13: 500 [IU]
  Filled 2017-05-13: qty 5

## 2017-05-13 MED ORDER — SODIUM CHLORIDE 0.9% FLUSH
10.0000 mL | INTRAVENOUS | Status: DC | PRN
  Administered 2017-05-13: 10 mL
  Filled 2017-05-13: qty 10

## 2017-05-21 ENCOUNTER — Telehealth: Payer: Self-pay | Admitting: *Deleted

## 2017-05-21 NOTE — Telephone Encounter (Signed)
"  My dad keeps missing calls from the office.  Who is trying to reach him?"  Appointment reminder call.  Provided appointment information.
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
## 2017-05-25 ENCOUNTER — Other Ambulatory Visit (HOSPITAL_BASED_OUTPATIENT_CLINIC_OR_DEPARTMENT_OTHER): Payer: Medicaid Other

## 2017-05-25 ENCOUNTER — Ambulatory Visit (HOSPITAL_BASED_OUTPATIENT_CLINIC_OR_DEPARTMENT_OTHER): Payer: Medicaid Other

## 2017-05-25 ENCOUNTER — Encounter: Payer: Self-pay | Admitting: Hematology

## 2017-05-25 ENCOUNTER — Ambulatory Visit (HOSPITAL_BASED_OUTPATIENT_CLINIC_OR_DEPARTMENT_OTHER): Payer: Medicaid Other | Admitting: Hematology

## 2017-05-25 ENCOUNTER — Ambulatory Visit: Payer: Medicaid Other

## 2017-05-25 ENCOUNTER — Telehealth: Payer: Self-pay | Admitting: Hematology

## 2017-05-25 VITALS — BP 123/80 | HR 96 | Temp 98.4°F | Resp 18 | Ht 69.0 in | Wt 130.2 lb

## 2017-05-25 DIAGNOSIS — C762 Malignant neoplasm of abdomen: Secondary | ICD-10-CM

## 2017-05-25 DIAGNOSIS — D630 Anemia in neoplastic disease: Secondary | ICD-10-CM

## 2017-05-25 DIAGNOSIS — Z5112 Encounter for antineoplastic immunotherapy: Secondary | ICD-10-CM | POA: Diagnosis present

## 2017-05-25 DIAGNOSIS — Z95828 Presence of other vascular implants and grafts: Secondary | ICD-10-CM

## 2017-05-25 DIAGNOSIS — C786 Secondary malignant neoplasm of retroperitoneum and peritoneum: Secondary | ICD-10-CM

## 2017-05-25 DIAGNOSIS — R634 Abnormal weight loss: Secondary | ICD-10-CM

## 2017-05-25 DIAGNOSIS — E46 Unspecified protein-calorie malnutrition: Secondary | ICD-10-CM | POA: Diagnosis not present

## 2017-05-25 DIAGNOSIS — D6959 Other secondary thrombocytopenia: Secondary | ICD-10-CM

## 2017-05-25 DIAGNOSIS — C186 Malignant neoplasm of descending colon: Secondary | ICD-10-CM

## 2017-05-25 DIAGNOSIS — D500 Iron deficiency anemia secondary to blood loss (chronic): Secondary | ICD-10-CM

## 2017-05-25 LAB — COMPREHENSIVE METABOLIC PANEL
ALBUMIN: 3.3 g/dL — AB (ref 3.5–5.0)
ALK PHOS: 92 U/L (ref 40–150)
ALT: 14 U/L (ref 0–55)
AST: 38 U/L — AB (ref 5–34)
Anion Gap: 10 mEq/L (ref 3–11)
BUN: 7.6 mg/dL (ref 7.0–26.0)
CO2: 23 mEq/L (ref 22–29)
CREATININE: 1 mg/dL (ref 0.7–1.3)
Calcium: 9.5 mg/dL (ref 8.4–10.4)
Chloride: 105 mEq/L (ref 98–109)
EGFR: >90 mL/min/{1.73_m2} (ref >90)
GLUCOSE: 159 mg/dL — AB (ref 70–140)
POTASSIUM: 3.5 meq/L (ref 3.5–5.1)
SODIUM: 138 meq/L (ref 136–145)
TOTAL PROTEIN: 8.7 g/dL — AB (ref 6.4–8.3)
Total Bilirubin: 0.48 mg/dL (ref 0.20–1.20)

## 2017-05-25 LAB — CBC WITH DIFFERENTIAL/PLATELET
BASO%: 3.2 % — AB (ref 0.0–2.0)
Basophils Absolute: 0.1 10*3/uL (ref 0.0–0.1)
EOS%: 3.5 % (ref 0.0–7.0)
Eosinophils Absolute: 0.1 10*3/uL (ref 0.0–0.5)
HCT: 41 % (ref 38.4–49.9)
HEMOGLOBIN: 13.5 g/dL (ref 13.0–17.1)
LYMPH#: 1.3 10*3/uL (ref 0.9–3.3)
LYMPH%: 38.9 % (ref 14.0–49.0)
MCH: 29.1 pg (ref 27.2–33.4)
MCHC: 32.9 g/dL (ref 32.0–36.0)
MCV: 88.4 fL (ref 79.3–98.0)
MONO#: 0.4 10*3/uL (ref 0.1–0.9)
MONO%: 12 % (ref 0.0–14.0)
NEUT%: 42.4 % (ref 39.0–75.0)
NEUTROS ABS: 1.4 10*3/uL — AB (ref 1.5–6.5)
Platelets: 64 10*3/uL — ABNORMAL LOW (ref 140–400)
RBC: 4.64 10*6/uL (ref 4.20–5.82)
RDW: 18.8 % — AB (ref 11.0–14.6)
WBC: 3.4 10*3/uL — AB (ref 4.0–10.3)

## 2017-05-25 LAB — CEA (IN HOUSE-CHCC): CEA (CHCC-IN HOUSE): 4.31 ng/mL (ref 0.00–5.00)

## 2017-05-25 LAB — IRON AND TIBC
%SAT: 21 % (ref 20–55)
Iron: 63 ug/dL (ref 42–163)
TIBC: 294 ug/dL (ref 202–409)
UIBC: 231 ug/dL (ref 117–376)

## 2017-05-25 LAB — FERRITIN: Ferritin: 517 ng/ml — ABNORMAL HIGH (ref 22–316)

## 2017-05-25 LAB — MAGNESIUM: Magnesium: 1.8 mg/dl (ref 1.5–2.5)

## 2017-05-25 MED ORDER — RIVAROXABAN 20 MG PO TABS: 20.0000 mg | ORAL_TABLET | Freq: Every day | ORAL | 3 refills | Status: DC

## 2017-05-25 MED ORDER — SODIUM CHLORIDE 0.9% FLUSH
10.0000 mL | INTRAVENOUS | Status: DC | PRN
  Administered 2017-05-25: 10 mL via INTRAVENOUS
  Filled 2017-05-25: qty 10

## 2017-05-25 MED ORDER — SODIUM CHLORIDE 0.9 % IV SOLN
6.5000 mg/kg | Freq: Once | INTRAVENOUS | Status: AC
  Administered 2017-05-25: 400 mg via INTRAVENOUS
  Filled 2017-05-25: qty 20

## 2017-05-25 MED ORDER — PROCHLORPERAZINE MALEATE 10 MG PO TABS: 10.0000 mg | ORAL_TABLET | Freq: Four times a day (QID) | ORAL | 3 refills | Status: DC | PRN

## 2017-05-25 MED ORDER — MIRTAZAPINE 15 MG PO TABS: 15.0000 mg | ORAL_TABLET | Freq: Every day | ORAL | 0 refills | Status: DC

## 2017-05-25 MED ORDER — SODIUM CHLORIDE 0.9% FLUSH
10.0000 mL | INTRAVENOUS | Status: DC | PRN
  Administered 2017-05-25: 10 mL
  Filled 2017-05-25: qty 10

## 2017-05-25 MED ORDER — ONDANSETRON HCL 8 MG PO TABS: 8.0000 mg | ORAL_TABLET | Freq: Two times a day (BID) | ORAL | 3 refills | Status: DC | PRN

## 2017-05-25 MED ORDER — SODIUM CHLORIDE 0.9 % IV SOLN
Freq: Once | INTRAVENOUS | Status: AC
  Administered 2017-05-25: 10:00:00 via INTRAVENOUS

## 2017-05-25 MED ORDER — OXYCODONE HCL 10 MG PO TABS: 10.0000 mg | ORAL_TABLET | Freq: Four times a day (QID) | ORAL | 0 refills | Status: DC | PRN

## 2017-05-25 MED ORDER — HYDROCORTISONE 2.5 % EX CREA: TOPICAL_CREAM | Freq: Two times a day (BID) | CUTANEOUS | 3 refills | Status: DC

## 2017-05-25 MED ORDER — HEPARIN SOD (PORK) LOCK FLUSH 100 UNIT/ML IV SOLN
500.0000 [IU] | Freq: Once | INTRAVENOUS | Status: AC | PRN
  Administered 2017-05-25: 500 [IU]
  Filled 2017-05-25: qty 5

## 2017-05-25 MED FILL — MIRTAZAPINE 15 MG TABLET: 15 | 30 days supply | Qty: 30 | Fill #0

## 2017-05-25 MED FILL — oxyCODONE HCL 10 MG TABS: 10 | 15 days supply | Qty: 60 | Fill #0

## 2017-05-25 MED FILL — ONDANSETRON HCL 8 MG TABLET: 8 | 15 days supply | Qty: 30 | Fill #0

## 2017-05-25 MED FILL — PROCHLORPERAZINE 10 MG TAB: 10 | 7 days supply | Qty: 30 | Fill #0

## 2017-05-25 MED FILL — HYDROCORTISONE 2.5% CREAM: 2.5 | 10 days supply | Qty: 30 | Fill #0

## 2017-05-25 MED FILL — XARELTO 20 MG TABLET: 20 | 30 days supply | Qty: 30 | Fill #0

## 2017-05-25 NOTE — Progress Notes (Signed)
Per [name], no chemotherapy today due to Plt count. We will reschedule him for his FOLFOX. Ok to treat with vectibix today without waiting for Mag result.

## 2017-05-25 NOTE — Congregational Nurse Program (Signed)
Congregational Nurse Program Note  Date of Encounter: 05/14/2017  Past Medical History: Past Medical History:  Diagnosis Date  . Malignant neoplasm of abdomen ([place])    [name] 01/11/2017  . Microcytic anemia    [name] 01/11/2017    Encounter Details:     CNP Questionnaire - 05/25/17 1624      Patient Demographics   Is this a new or existing patient? Existing   Patient is considered a/an Not Applicable   Race African-American/Black     Patient Assistance   Location of Patient Assistance [facility] patient   Patient's financial/insurance status Medicaid   Uninsured Patient ([facility]) No   Patient referred to apply for the following financial assistance Not Applicable   Food insecurities addressed Not Applicable   Transportation assistance No   Assistance securing medications No   Educational health offerings Cancer;Medications     Encounter Details   Primary purpose of visit Spiritual Care/Support Visit;Education/Health Concerns   Was an Emergency Department visit averted? Not Applicable   Does patient have a medical provider? Yes   Patient referred to Clinic;Follow up with established PCP   Was a mental health screening completed? (GAINS tool) No   Does patient have dental issues? No   Does patient have vision issues? No   Does your patient have an abnormal blood pressure today? No   Since previous encounter, have you referred patient for abnormal blood pressure that resulted in a new diagnosis or medication change? No   Does your patient have an abnormal blood glucose today? No   Since previous encounter, have you referred patient for abnormal blood glucose that resulted in a new diagnosis or medication change? No   Was there a life-saving intervention made? No    [facility] team visit  Today  Finds  Client doing well ,seen for his  Cancer treatment this week ,states he is doing well  No reactions. Taking his medications.Continues to look for housing ,most places  have long waiting list. Nurse has ask for an extension and gift card for client will let client know  about approvals . SW working on housing leads . Next  Visit  05-26-17  @ 9;30 pm

## 2017-05-25 NOTE — Patient Instructions (Signed)
[facility] Discharge Instructions for Patients Receiving Chemotherapy  Today you received the following : Vectibix  To help prevent nausea and vomiting after your treatment, we encourage you to take your nausea medication    If you develop nausea and vomiting that is not controlled by your nausea medication, call the clinic.   BELOW ARE SYMPTOMS THAT SHOULD BE REPORTED IMMEDIATELY:  *FEVER GREATER THAN 100.5 F  *CHILLS WITH OR WITHOUT FEVER  NAUSEA AND VOMITING THAT IS NOT CONTROLLED WITH YOUR NAUSEA MEDICATION  *UNUSUAL SHORTNESS OF BREATH  *UNUSUAL BRUISING OR BLEEDING  TENDERNESS IN MOUTH AND THROAT WITH OR WITHOUT PRESENCE OF ULCERS  *URINARY PROBLEMS  *BOWEL PROBLEMS  UNUSUAL RASH Items with * indicate a potential emergency and should be followed up as soon as possible.  Feel free to call the clinic you have any questions or concerns. The clinic phone number is [phone].  Please show the [place] at check-in to the Emergency Department and triage nurse.

## 2017-05-25 NOTE — Patient Instructions (Signed)

## 2017-05-25 NOTE — Telephone Encounter (Signed)
Gave patient AVS and calender per 5/29 LOS  

## 2017-05-26 NOTE — Congregational Nurse Program (Signed)
Congregational Nurse Program Note  Date of Encounter: 05/26/2017  Past Medical History: Past Medical History:  Diagnosis Date  . Malignant neoplasm of abdomen ([name])    Jeff [name] 01/11/2017  . Microcytic anemia    Jeff [name] 01/11/2017    Encounter Details:     CNP Questionnaire - 05/26/17 1733      Patient Demographics   Is this a new or existing patient? Existing   Patient is considered a/an Not Applicable   Race African-American/Black     Patient Assistance   Location of Patient Assistance [facility] patient   Patient's financial/insurance status Medicaid   Uninsured Patient ([place] Card/Care Connects) No   Patient referred to apply for the following financial assistance Not Applicable   Food insecurities addressed Provided food gift voucher   Transportation assistance No   Assistance securing medications No   Educational health offerings Medications;Chronic disease;Cancer;Nutrition     Encounter Details   Primary purpose of visit Education/Health Concerns;Spiritual Care/Support Visit;Chronic Illness/Condition Visit   Was an Emergency Department visit averted? Not Applicable   Does patient have a medical provider? Yes   Patient referred to Clinic;Follow up with established PCP   Was a mental health screening completed? (GAINS tool) No   Does patient have dental issues? No   Does patient have vision issues? No   Does your patient have an abnormal blood pressure today? No   Since previous encounter, have you referred patient for abnormal blood pressure that resulted in a new diagnosis or medication change? No   Does your patient have an abnormal blood glucose today? No   Since previous encounter, have you referred patient for abnormal blood glucose that resulted in a new diagnosis or medication change? No   Was there a life-saving intervention made? No    Joint  H[facility]ES team visit today. Met client at the [name] where he  Frequents  To socialize with  Friends . 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 He is anxious about getting him a place to live to call his own. States he couldn't complete his  Treatment this week blood  Counts to low. They will call to reschedule his remaining treatment. Doesn't have much appetite he states and takes him a day to complete 1 bottle of  Ensure .Counseled regarding the importance of good nutrition.  Extension to [name] approved and nurse picked up gift card for client . Next visit  06-03-17 @ 9:30 am. Plans  Continue to look for suitable housing ,monitor physical health.
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
## 2017-05-31 ENCOUNTER — Ambulatory Visit (HOSPITAL_BASED_OUTPATIENT_CLINIC_OR_DEPARTMENT_OTHER): Payer: Medicaid Other

## 2017-05-31 ENCOUNTER — Other Ambulatory Visit (HOSPITAL_BASED_OUTPATIENT_CLINIC_OR_DEPARTMENT_OTHER): Payer: Medicaid Other

## 2017-05-31 ENCOUNTER — Other Ambulatory Visit: Payer: Self-pay | Admitting: Hematology

## 2017-05-31 ENCOUNTER — Telehealth: Payer: Self-pay

## 2017-05-31 VITALS — BP 113/82 | HR 87 | Temp 98.7°F | Resp 18

## 2017-05-31 DIAGNOSIS — C186 Malignant neoplasm of descending colon: Secondary | ICD-10-CM

## 2017-05-31 DIAGNOSIS — Z452 Encounter for adjustment and management of vascular access device: Secondary | ICD-10-CM

## 2017-05-31 DIAGNOSIS — C762 Malignant neoplasm of abdomen: Secondary | ICD-10-CM

## 2017-05-31 DIAGNOSIS — Z95828 Presence of other vascular implants and grafts: Secondary | ICD-10-CM

## 2017-05-31 DIAGNOSIS — D500 Iron deficiency anemia secondary to blood loss (chronic): Secondary | ICD-10-CM

## 2017-05-31 LAB — CBC WITH DIFFERENTIAL/PLATELET
BASO%: 1.1 % (ref 0.0–2.0)
Basophils Absolute: 0 10*3/uL (ref 0.0–0.1)
EOS ABS: 0 10*3/uL (ref 0.0–0.5)
EOS%: 1.1 % (ref 0.0–7.0)
HEMATOCRIT: 37.7 % — AB (ref 38.4–49.9)
HEMOGLOBIN: 12.2 g/dL — AB (ref 13.0–17.1)
LYMPH#: 1.8 10*3/uL (ref 0.9–3.3)
LYMPH%: 50.9 % — AB (ref 14.0–49.0)
MCH: 29 pg (ref 27.2–33.4)
MCHC: 32.4 g/dL (ref 32.0–36.0)
MCV: 89.8 fL (ref 79.3–98.0)
MONO#: 0.5 10*3/uL (ref 0.1–0.9)
MONO%: 13.9 % (ref 0.0–14.0)
NEUT%: 33 % — ABNORMAL LOW (ref 39.0–75.0)
NEUTROS ABS: 1.2 10*3/uL — AB (ref 1.5–6.5)
NRBC: 0 % (ref 0–0)
PLATELETS: 82 10*3/uL — AB (ref 140–400)
RBC: 4.2 10*6/uL (ref 4.20–5.82)
RDW: 17 % — ABNORMAL HIGH (ref 11.0–14.6)
WBC: 3.5 10*3/uL — AB (ref 4.0–10.3)

## 2017-05-31 LAB — COMPREHENSIVE METABOLIC PANEL
ALBUMIN: 3.4 g/dL — AB (ref 3.5–5.0)
ALT: 13 U/L (ref 0–55)
ANION GAP: 14 meq/L — AB (ref 3–11)
AST: 35 U/L — AB (ref 5–34)
Alkaline Phosphatase: 79 U/L (ref 40–150)
BILIRUBIN TOTAL: 0.32 mg/dL (ref 0.20–1.20)
BUN: 9.3 mg/dL (ref 7.0–26.0)
CALCIUM: 9.3 mg/dL (ref 8.4–10.4)
CO2: 23 mEq/L (ref 22–29)
CREATININE: 1 mg/dL (ref 0.7–1.3)
Chloride: 104 mEq/L (ref 98–109)
EGFR: >90 mL/min/{1.73_m2} (ref >90)
Glucose: 83 mg/dl (ref 70–140)
Potassium: 3.5 mEq/L (ref 3.5–5.1)
Sodium: 140 mEq/L (ref 136–145)
TOTAL PROTEIN: 8.6 g/dL — AB (ref 6.4–8.3)

## 2017-05-31 MED ORDER — SODIUM CHLORIDE 0.9% FLUSH
10.0000 mL | INTRAVENOUS | Status: DC | PRN
  Administered 2017-05-31: 10 mL via INTRAVENOUS
  Filled 2017-05-31: qty 10

## 2017-05-31 MED ORDER — HEPARIN SOD (PORK) LOCK FLUSH 100 UNIT/ML IV SOLN
500.0000 [IU] | INTRAVENOUS | Status: DC | PRN
  Administered 2017-05-31: 500 [IU] via INTRAVENOUS
  Filled 2017-05-31: qty 5
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
## 2017-05-31 NOTE — Telephone Encounter (Signed)
I called his daughter back and updated her the his cancer and treatment status and reason his chemo has been held. I encouraged her to come with pt next Monday for f/u and treatment but she is not available . She knows to call me if she has questions in the future.   [name] MD

## 2017-05-31 NOTE — Progress Notes (Signed)
Per [name] patient is not to receive treatment today due to platelet count of 82 and ANC of 1.2. Patient to return 06/07/17 for treatment. Patient aware and verbalized understanding.

## 2017-06-07 ENCOUNTER — Telehealth: Payer: Self-pay | Admitting: Hematology

## 2017-06-07 ENCOUNTER — Other Ambulatory Visit (HOSPITAL_BASED_OUTPATIENT_CLINIC_OR_DEPARTMENT_OTHER): Payer: Medicaid Other

## 2017-06-07 ENCOUNTER — Encounter: Payer: Self-pay | Admitting: Hematology

## 2017-06-07 ENCOUNTER — Ambulatory Visit (HOSPITAL_BASED_OUTPATIENT_CLINIC_OR_DEPARTMENT_OTHER): Payer: Medicaid Other | Admitting: Hematology

## 2017-06-07 ENCOUNTER — Ambulatory Visit (HOSPITAL_BASED_OUTPATIENT_CLINIC_OR_DEPARTMENT_OTHER): Payer: Medicaid Other

## 2017-06-07 VITALS — BP 126/82 | HR 88 | Temp 98.1°F | Resp 18 | Ht 69.0 in | Wt 135.0 lb

## 2017-06-07 DIAGNOSIS — C786 Secondary malignant neoplasm of retroperitoneum and peritoneum: Secondary | ICD-10-CM

## 2017-06-07 DIAGNOSIS — C186 Malignant neoplasm of descending colon: Secondary | ICD-10-CM | POA: Diagnosis not present

## 2017-06-07 DIAGNOSIS — Z5112 Encounter for antineoplastic immunotherapy: Secondary | ICD-10-CM

## 2017-06-07 DIAGNOSIS — C762 Malignant neoplasm of abdomen: Secondary | ICD-10-CM

## 2017-06-07 DIAGNOSIS — D630 Anemia in neoplastic disease: Secondary | ICD-10-CM

## 2017-06-07 DIAGNOSIS — Z5111 Encounter for antineoplastic chemotherapy: Secondary | ICD-10-CM | POA: Diagnosis not present

## 2017-06-07 DIAGNOSIS — D500 Iron deficiency anemia secondary to blood loss (chronic): Secondary | ICD-10-CM

## 2017-06-07 DIAGNOSIS — Z452 Encounter for adjustment and management of vascular access device: Secondary | ICD-10-CM

## 2017-06-07 DIAGNOSIS — I82411 Acute embolism and thrombosis of right femoral vein: Secondary | ICD-10-CM | POA: Diagnosis not present

## 2017-06-07 DIAGNOSIS — Z7901 Long term (current) use of anticoagulants: Secondary | ICD-10-CM | POA: Diagnosis not present

## 2017-06-07 DIAGNOSIS — Z95828 Presence of other vascular implants and grafts: Secondary | ICD-10-CM

## 2017-06-07 LAB — COMPREHENSIVE METABOLIC PANEL
ALBUMIN: 3.4 g/dL — AB (ref 3.5–5.0)
ALK PHOS: 66 U/L (ref 40–150)
ALT: 10 U/L (ref 0–55)
AST: 34 U/L (ref 5–34)
Anion Gap: 12 mEq/L — ABNORMAL HIGH (ref 3–11)
BUN: 6.1 mg/dL — ABNORMAL LOW (ref 7.0–26.0)
CHLORIDE: 104 meq/L (ref 98–109)
CO2: 22 mEq/L (ref 22–29)
Calcium: 9.4 mg/dL (ref 8.4–10.4)
Creatinine: 0.8 mg/dL (ref 0.7–1.3)
Glucose: 89 mg/dl (ref 70–140)
POTASSIUM: 3.7 meq/L (ref 3.5–5.1)
SODIUM: 139 meq/L (ref 136–145)
Total Bilirubin: 0.45 mg/dL (ref 0.20–1.20)
Total Protein: 8.4 g/dL — ABNORMAL HIGH (ref 6.4–8.3)

## 2017-06-07 LAB — CBC WITH DIFFERENTIAL/PLATELET
BASO%: 0.5 % (ref 0.0–2.0)
BASOS ABS: 0 10*3/uL (ref 0.0–0.1)
EOS ABS: 0.1 10*3/uL (ref 0.0–0.5)
EOS%: 1.4 % (ref 0.0–7.0)
HCT: 39.3 % (ref 38.4–49.9)
HEMOGLOBIN: 12.8 g/dL — AB (ref 13.0–17.1)
LYMPH%: 26.4 % (ref 14.0–49.0)
MCH: 29.2 pg (ref 27.2–33.4)
MCHC: 32.6 g/dL (ref 32.0–36.0)
MCV: 89.6 fL (ref 79.3–98.0)
MONO#: 0.6 10*3/uL (ref 0.1–0.9)
MONO%: 11.8 % (ref 0.0–14.0)
NEUT#: 3.1 10*3/uL (ref 1.5–6.5)
NEUT%: 59.9 % (ref 39.0–75.0)
Platelets: 112 10*3/uL — ABNORMAL LOW (ref 140–400)
RBC: 4.39 10*6/uL (ref 4.20–5.82)
RDW: 17.2 % — AB (ref 11.0–14.6)
WBC: 5.2 10*3/uL (ref 4.0–10.3)
lymph#: 1.4 10*3/uL (ref 0.9–3.3)

## 2017-06-07 LAB — MAGNESIUM: MAGNESIUM: 1.6 mg/dL (ref 1.5–2.5)

## 2017-06-07 MED ORDER — HEPARIN SOD (PORK) LOCK FLUSH 100 UNIT/ML IV SOLN
500.0000 [IU] | INTRAVENOUS | Status: DC | PRN
  Filled 2017-06-07: qty 5

## 2017-06-07 MED ORDER — SODIUM CHLORIDE 0.9 % IV SOLN
Freq: Once | INTRAVENOUS | Status: AC
  Administered 2017-06-07: 10:00:00 via INTRAVENOUS

## 2017-06-07 MED ORDER — FLUOROURACIL CHEMO INJECTION 5 GM/100ML
2000.0000 mg/m2 | INTRAVENOUS | Status: DC
  Administered 2017-06-07: 3500 mg via INTRAVENOUS
  Filled 2017-06-07: qty 70

## 2017-06-07 MED ORDER — PALONOSETRON HCL INJECTION 0.25 MG/5ML
INTRAVENOUS | Status: AC
  Filled 2017-06-07: qty 5

## 2017-06-07 MED ORDER — SODIUM CHLORIDE 0.9 % IV SOLN
6.5000 mg/kg | Freq: Once | INTRAVENOUS | Status: AC
  Administered 2017-06-07: 400 mg via INTRAVENOUS
  Filled 2017-06-07: qty 20

## 2017-06-07 MED ORDER — DEXAMETHASONE SODIUM PHOSPHATE 10 MG/ML IJ SOLN
10.0000 mg | Freq: Once | INTRAMUSCULAR | Status: AC
  Administered 2017-06-07: 10 mg via INTRAVENOUS

## 2017-06-07 MED ORDER — DEXTROSE 5 % IV SOLN
Freq: Once | INTRAVENOUS | Status: AC
  Administered 2017-06-07: 12:00:00 via INTRAVENOUS

## 2017-06-07 MED ORDER — DEXAMETHASONE SODIUM PHOSPHATE 10 MG/ML IJ SOLN
INTRAMUSCULAR | Status: AC
  Filled 2017-06-07: qty 1

## 2017-06-07 MED ORDER — SODIUM CHLORIDE 0.9% FLUSH
10.0000 mL | INTRAVENOUS | Status: DC | PRN
  Administered 2017-06-07: 10 mL via INTRAVENOUS
  Filled 2017-06-07: qty 10

## 2017-06-07 MED ORDER — PALONOSETRON HCL INJECTION 0.25 MG/5ML
0.2500 mg | Freq: Once | INTRAVENOUS | Status: AC
  Administered 2017-06-07: 0.25 mg via INTRAVENOUS

## 2017-06-07 MED ORDER — LEUCOVORIN CALCIUM INJECTION 350 MG
398.0000 mg/m2 | Freq: Once | INTRAVENOUS | Status: AC
  Administered 2017-06-07: 700 mg via INTRAVENOUS
  Filled 2017-06-07: qty 35

## 2017-06-07 MED ORDER — OXALIPLATIN CHEMO INJECTION 100 MG/20ML
57.0000 mg/m2 | Freq: Once | INTRAVENOUS | Status: AC
  Administered 2017-06-07: 100 mg via INTRAVENOUS
  Filled 2017-06-07: qty 20

## 2017-06-07 NOTE — Telephone Encounter (Signed)
R/s appt to APP on 6/26 per 6/11 LOS - Gave patient AVS and calender per los . [facility] to contact patient with CT schedule. Called Dr. Otila Kluver office per referral - office closed from 6/11 to 6/18 to contact when offices open .

## 2017-06-07 NOTE — Patient Instructions (Signed)

## 2017-06-07 NOTE — Patient Instructions (Signed)
[facility] Discharge Instructions for Patients Receiving Chemotherapy  Today you received the following chemotherapy agents: Vectibix, Oxaliplatin, Leucovorin and Adrucil   To help prevent nausea and vomiting after your treatment, we encourage you to take your nausea medication as directed.    If you develop nausea and vomiting that is not controlled by your nausea medication, call the clinic.   BELOW ARE SYMPTOMS THAT SHOULD BE REPORTED IMMEDIATELY:  *FEVER GREATER THAN 100.5 F  *CHILLS WITH OR WITHOUT FEVER  NAUSEA AND VOMITING THAT IS NOT CONTROLLED WITH YOUR NAUSEA MEDICATION  *UNUSUAL SHORTNESS OF BREATH  *UNUSUAL BRUISING OR BLEEDING  TENDERNESS IN MOUTH AND THROAT WITH OR WITHOUT PRESENCE OF ULCERS  *URINARY PROBLEMS  *BOWEL PROBLEMS  UNUSUAL RASH Items with * indicate a potential emergency and should be followed up as soon as possible.  Feel free to call the clinic you have any questions or concerns. The clinic phone number is [phone].  Please show the [facility] at check-in to the Emergency Department and triage nurse.

## 2017-06-08 ENCOUNTER — Telehealth: Payer: Self-pay

## 2017-06-08 NOTE — Congregational Nurse Program (Signed)
Congregational Nurse Program Note  Date of Encounter: 06/02/2017  Past Medical History: Past Medical History:  Diagnosis Date  . Malignant neoplasm of abdomen ([place])    [name] 01/11/2017  . Microcytic anemia    [name] 01/11/2017    Encounter Details:     CNP Questionnaire - 06/08/17 1715      Patient Demographics   Is this a new or existing patient? Existing   Patient is considered a/an Not Applicable   Race African-American/Black     Patient Assistance   Location of Patient Assistance [facility] patient   Patient's financial/insurance status Medicaid   Uninsured Patient ([facility]) No   Patient referred to apply for the following financial assistance Not Applicable   Food insecurities addressed Not Applicable   Transportation assistance No   Assistance securing medications No   Educational health offerings Chronic disease;Cancer;Nutrition;Medications     Encounter Details   Primary purpose of visit Spiritual Care/Support Visit;Education/Health Concerns;Chronic Illness/Condition Visit   Was an Emergency Department visit averted? Not Applicable   Does patient have a medical provider? Yes   Patient referred to Clinic;Follow up with established PCP   Was a mental health screening completed? (GAINS tool) No   Does patient have dental issues? No   Does patient have vision issues? No   Does your patient have an abnormal blood pressure today? No   Since previous encounter, have you referred patient for abnormal blood pressure that resulted in a new diagnosis or medication change? No   Does your patient have an abnormal blood glucose today? No   Since previous encounter, have you referred patient for abnormal blood glucose that resulted in a new diagnosis or medication change? No   Was there a life-saving intervention made? No     Joint  HOPES team visit . Client was in room asleep  ,had  Difficulty getting him to open the door, was well aware of  Visit as SW had  reminded client,TC to daughter she was on her way after work and when told his  Truck was there she felt he was in the room . Team knocked  Several more  Times client finally answered the door .Stated  He got in and laid  Down. Client is  Usually alert and very talkative ,today seemed a little out of it,very sleepy ,no energy ,not  Very engaged in the  [place]. Daughter states  she has noticed a difference in his  Energy level . Unable to  Do his  Treatment last  Week or  This  Week ,platelet level to  Low . States cancer center will call him for his  Next treatment. Doesn't have  a  Much  appetite but  States he is  Eating and tries to  Drink his ensure ,1 bottle maybe all day . Encouraged to try to drink 2 if he cant eat  To keep energy level up. SW and daughter working very hard on all leads to find  housing for client ,following up  on all leads. Counseled client on medication side effects ,to  Call center if he feels symptoms  Increasing ,call team if  He  Needs [place]  Or if  Anything changes.  Monitor weekly ,next visit 06-09-17 @ 7 pm , Sw and nurse will check on client by calling.

## 2017-06-08 NOTE — Telephone Encounter (Signed)
TC to check on HOPES client and remind  Of  Visit  On tomorrow ,left a message on his  Phone.

## 2017-06-09 ENCOUNTER — Ambulatory Visit (HOSPITAL_BASED_OUTPATIENT_CLINIC_OR_DEPARTMENT_OTHER): Payer: Medicaid Other

## 2017-06-09 VITALS — BP 120/72 | HR 105 | Temp 99.3°F | Resp 17

## 2017-06-09 DIAGNOSIS — Z452 Encounter for adjustment and management of vascular access device: Secondary | ICD-10-CM

## 2017-06-09 DIAGNOSIS — C186 Malignant neoplasm of descending colon: Secondary | ICD-10-CM

## 2017-06-09 MED ORDER — HEPARIN SOD (PORK) LOCK FLUSH 100 UNIT/ML IV SOLN
500.0000 [IU] | Freq: Once | INTRAVENOUS | Status: AC | PRN
  Administered 2017-06-09: 500 [IU]
  Filled 2017-06-09: qty 5

## 2017-06-09 MED ORDER — PEGFILGRASTIM INJECTION 6 MG/0.6ML ~~LOC~~
6.0000 mg | PREFILLED_SYRINGE | Freq: Once | SUBCUTANEOUS | Status: DC
  Filled 2017-06-09: qty 0.6

## 2017-06-09 MED ORDER — SODIUM CHLORIDE 0.9% FLUSH
10.0000 mL | INTRAVENOUS | Status: DC | PRN
  Administered 2017-06-09: 10 mL
  Filled 2017-06-09: qty 10

## 2017-06-09 NOTE — Patient Instructions (Signed)
Implanted Port Home Guide An implanted port is a type of central line that is placed under the skin. Central lines are used to provide IV access when treatment or nutrition needs to be given through a person's veins. Implanted ports are used for long-term IV access. An implanted port may be placed because:  You need IV medicine that would be irritating to the small veins in your hands or arms.  You need long-term IV medicines, such as antibiotics.  You need IV nutrition for a long period.  You need frequent blood draws for lab tests.  You need dialysis.  Implanted ports are usually placed in the chest area, but they can also be placed in the upper arm, the abdomen, or the leg. An implanted port has two main parts:  Reservoir. The reservoir is round and will appear as a small, raised area under your skin. The reservoir is the part where a needle is inserted to give medicines or draw blood.  Catheter. The catheter is a thin, flexible tube that extends from the reservoir. The catheter is placed into a large vein. Medicine that is inserted into the reservoir goes into the catheter and then into the vein.  How will I care for my incision site? Do not get the incision site wet. Bathe or shower as directed by your health care provider. How is my port accessed? Special steps must be taken to access the port:  Before the port is accessed, a numbing cream can be placed on the skin. This helps numb the skin over the port site.  Your health care provider uses a sterile technique to access the port. ? Your health care provider must put on a mask and sterile gloves. ? The skin over your port is cleaned carefully with an antiseptic and allowed to dry. ? The port is gently pinched between sterile gloves, and a needle is inserted into the port.  Only "non-coring" port needles should be used to access the port. Once the port is accessed, a blood return should be checked. This helps ensure that the port  is in the vein and is not clogged.  If your port needs to remain accessed for a constant infusion, a clear (transparent) bandage will be placed over the needle site. The bandage and needle will need to be changed every week, or as directed by your health care provider.  Keep the bandage covering the needle clean and dry. Do not get it wet. Follow your health care provider's instructions on how to take a shower or bath while the port is accessed.  If your port does not need to stay accessed, no bandage is needed over the port.  What is flushing? Flushing helps keep the port from getting clogged. Follow your health care provider's instructions on how and when to flush the port. Ports are usually flushed with saline solution or a medicine called heparin. The need for flushing will depend on how the port is used.  If the port is used for intermittent medicines or blood draws, the port will need to be flushed: ? After medicines have been given. ? After blood has been drawn. ? As part of routine maintenance.  If a constant infusion is running, the port may not need to be flushed.  How long will my port stay implanted? The port can stay in for as long as your health care provider thinks it is needed. When it is time for the port to come out, surgery will be   done to remove it. The procedure is similar to the one performed when the port was put in. When should I seek immediate medical care? When you have an implanted port, you should seek immediate medical care if:  You notice a bad smell coming from the incision site.  You have swelling, redness, or drainage at the incision site.  You have more swelling or pain at the port site or the surrounding area.  You have a fever that is not controlled with medicine.  This information is not intended to replace advice given to you by your health care provider. Make sure you discuss any questions you have with your health care provider. Document  Released: 12/14/2005 Document Revised: 05/21/2016 Document Reviewed: 08/21/2013 Elsevier Interactive Patient Education  2017 [employer]  

## 2017-06-11 ENCOUNTER — Telehealth: Payer: Self-pay | Admitting: *Deleted

## 2017-06-11 NOTE — Telephone Encounter (Signed)
Received notification from after hours call from 06/09/17 re:  Daughter  [name] wanting to talk to nurse about pt's chemo treatment.   Spoke with [name], and was informed that pt refused Neulasta injection at pump d/c appt on 06/09/17.   [name] was concerned that she was not notified of pt's refusal of med - pt has problem with making decisions about his care.  [name] is the contact person for any health care related issues.   Explained to [place] the reason why Neulasta was ordered. [name] verbalized understanding; stated she has agreed for pt to receive Neulasta with next cycle of chemo.  [name] will explain to pt why he needs the injection after chemo. [id number]     Phone      [phone].

## 2017-06-16 NOTE — Congregational Nurse Program (Signed)
Congregational Nurse Program Note  Date of Encounter: 06/16/2017  Past Medical History: Past Medical History:  Diagnosis Date  . Malignant neoplasm of abdomen ([place])    [name] 01/11/2017  . Microcytic anemia    /notes 01/11/2017    Encounter Details:     CNP Questionnaire - 06/16/17 1537      Patient Demographics   Is this a new or existing patient? Existing   Patient is considered a/an Not Applicable   Race African-American/Black     Patient Assistance   Location of Patient Assistance HOPES patient   Patient's financial/insurance status Medicaid   Uninsured Patient ([facility] Card/Care Connect[facility]) No   Patient referred to apply for the following financial assistance Not Applicable   Food insecurities addressed Not Applicable   Transportation assistance No   Assistance securing medications No   Educational health offerings Medications;Cancer     Encounter Details   Primary purpose of visit Education/Health Concerns;Spiritual Care/Support Visit   Was an Emergency Department visit averted? Not Applicable   Does patient have a medical provider? Yes   Patient referred to Clinic;Follow up with established PCP   Was a mental health screening completed? (GAINS tool) No   Does patient have dental issues? No   Does patient have vision issues? No   Does your patient have an abnormal blood pressure today? No   Since previous encounter, have you referred patient for abnormal blood pressure that resulted in a new diagnosis or medication change? No   Does your patient have an abnormal blood glucose today? No   Since previous encounter, have you referred patient for abnormal blood glucose that resulted in a new diagnosis or medication change? No   Was there a life-saving intervention made? No    Per request  Of  Client team met client across town today at the  [employer] on [address] near [place] for a visit as he was up and out early today assisting a friend with his  truck to move  some furniture. States he  Just  drove the person was not doing to much . In a good mood today .states he feels good. has a promising lead on housing and feels it will work for him he hopes to get an answer on it by Friday of this week . If so  he may be able to move within 2 weeks .Really wants to have his  own place . Team was supportive and knows that SW and daughter have been looking into all leads to get him housed . He is use to being independent and feels he needs to be able to support himself. Client has always worked and provided for self. Team will complete [facility]  application once housing is secured and we know what he needs. States he  Agreed to getting a shot to help his  Blood  Count and was able to talk to his  MD . Informed client that  Stay extension was approved  Until 07-18-17. Follow  Weekly ,SW to work with daughter on finalizing housing and look for any emergency financial resources if any  Are available once client is discharged from [facility] and he gets into difficulties as crisis funding for rent and food. A list for client will be provided ,even though he states he will be fine and make it on his own .  Very independent client ,that appreciates what the program has done but states it has worried him for the pass 3  months not  Having his  Own place. Team as supportive as possible.

## 2017-06-16 NOTE — Congregational Nurse Program (Signed)
Congregational Nurse Program Note  Date of Encounter: 06/11/2017  Past Medical History: Past Medical History:  Diagnosis Date  . Malignant neoplasm of abdomen ([facility])    [name] 01/11/2017  . Microcytic anemia    /notes 01/11/2017    Encounter Details:     CNP Questionnaire - 06/16/17 1516      Patient Demographics   Is this a new or existing patient? Existing   Patient is considered a/an Not Applicable   Race African-American/Black     Patient Assistance   Location of Patient Assistance HOPES patient   Patient's financial/insurance status Medicaid   Uninsured Patient ([facility]) No   Patient referred to apply for the following financial assistance Not Applicable   Food insecurities addressed Not Applicable   Transportation assistance No   Assistance securing medications No   Educational health offerings Cancer;Medications     Encounter Details   Primary purpose of visit Education/Health Concerns;Spiritual Care/Support Visit;Post PCP Visit   Was an Emergency Department visit averted? Not Applicable   Does patient have a medical provider? Yes   Patient referred to Clinic;Follow up with established PCP   Was a mental health screening completed? (GAINS tool) No   Does patient have dental issues? No   Does patient have vision issues? No   Does your patient have an abnormal blood pressure today? No   Since previous encounter, have you referred patient for abnormal blood pressure that resulted in a new diagnosis or medication change? No   Does your patient have an abnormal blood glucose today? No   Since previous encounter, have you referred patient for abnormal blood glucose that resulted in a new diagnosis or medication change? No   Was there a life-saving intervention made? No      Joint HOPES team visit today ,client states he is feeling better today ,has an ingrown toenail that the  MD will be referring him to get taken care of ,treatments  Resumed . States  he is taking his  Medications ,no  Side  Effects ,has picked up his  Refills , weight at  MD visit was 135 lbs, blood pressure was 126/82. Nurse will request an extension on his  Stay today and have that information for him on the next visit . SW and daughter continue to follow  All leads for housing for client . Next treatment 06-22-17 .  Next visit 06-16-17. Client to call team if he needs [place].
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
## 2017-06-22 ENCOUNTER — Ambulatory Visit: Payer: Medicaid Other | Admitting: Hematology

## 2017-06-22 ENCOUNTER — Other Ambulatory Visit: Payer: Self-pay | Admitting: *Deleted

## 2017-06-22 ENCOUNTER — Ambulatory Visit (HOSPITAL_BASED_OUTPATIENT_CLINIC_OR_DEPARTMENT_OTHER): Payer: Medicaid Other | Admitting: Adult Health

## 2017-06-22 ENCOUNTER — Other Ambulatory Visit: Payer: Medicaid Other

## 2017-06-22 ENCOUNTER — Other Ambulatory Visit (HOSPITAL_BASED_OUTPATIENT_CLINIC_OR_DEPARTMENT_OTHER): Payer: Medicaid Other

## 2017-06-22 ENCOUNTER — Other Ambulatory Visit: Payer: Self-pay | Admitting: Oncology

## 2017-06-22 ENCOUNTER — Ambulatory Visit (HOSPITAL_BASED_OUTPATIENT_CLINIC_OR_DEPARTMENT_OTHER): Payer: Medicaid Other

## 2017-06-22 ENCOUNTER — Encounter: Payer: Self-pay | Admitting: Adult Health

## 2017-06-22 VITALS — BP 129/71 | HR 79 | Temp 97.8°F | Resp 18 | Ht 69.0 in | Wt 137.9 lb

## 2017-06-22 DIAGNOSIS — Z452 Encounter for adjustment and management of vascular access device: Secondary | ICD-10-CM | POA: Diagnosis present

## 2017-06-22 DIAGNOSIS — C186 Malignant neoplasm of descending colon: Secondary | ICD-10-CM

## 2017-06-22 DIAGNOSIS — Z5111 Encounter for antineoplastic chemotherapy: Secondary | ICD-10-CM | POA: Diagnosis not present

## 2017-06-22 DIAGNOSIS — Z95828 Presence of other vascular implants and grafts: Secondary | ICD-10-CM

## 2017-06-22 DIAGNOSIS — C762 Malignant neoplasm of abdomen: Secondary | ICD-10-CM

## 2017-06-22 DIAGNOSIS — D630 Anemia in neoplastic disease: Secondary | ICD-10-CM | POA: Diagnosis not present

## 2017-06-22 DIAGNOSIS — Z5112 Encounter for antineoplastic immunotherapy: Secondary | ICD-10-CM

## 2017-06-22 DIAGNOSIS — D500 Iron deficiency anemia secondary to blood loss (chronic): Secondary | ICD-10-CM

## 2017-06-22 LAB — COMPREHENSIVE METABOLIC PANEL
ALBUMIN: 3.1 g/dL — AB (ref 3.5–5.0)
ALK PHOS: 89 U/L (ref 40–150)
ALT: 11 U/L (ref 0–55)
AST: 31 U/L (ref 5–34)
Anion Gap: 10 mEq/L (ref 3–11)
BILIRUBIN TOTAL: 0.44 mg/dL (ref 0.20–1.20)
BUN: 12.4 mg/dL (ref 7.0–26.0)
CO2: 23 mEq/L (ref 22–29)
Calcium: 9 mg/dL (ref 8.4–10.4)
Chloride: 107 mEq/L (ref 98–109)
Creatinine: 0.9 mg/dL (ref 0.7–1.3)
EGFR: >90 mL/min/{1.73_m2} (ref >90)
GLUCOSE: 112 mg/dL (ref 70–140)
Potassium: 3.8 mEq/L (ref 3.5–5.1)
SODIUM: 140 meq/L (ref 136–145)
TOTAL PROTEIN: 8 g/dL (ref 6.4–8.3)

## 2017-06-22 LAB — MAGNESIUM: MAGNESIUM: 1.7 mg/dL (ref 1.5–2.5)

## 2017-06-22 LAB — CEA (IN HOUSE-CHCC): CEA (CHCC-In House): 5.53 ng/mL — ABNORMAL HIGH (ref 0.00–5.00)

## 2017-06-22 LAB — CBC WITH DIFFERENTIAL/PLATELET
BASO%: 1 % (ref 0.0–2.0)
Basophils Absolute: 0 10*3/uL (ref 0.0–0.1)
EOS ABS: 0.1 10*3/uL (ref 0.0–0.5)
EOS%: 3.6 % (ref 0.0–7.0)
HCT: 39 % (ref 38.4–49.9)
HEMOGLOBIN: 12.6 g/dL — AB (ref 13.0–17.1)
LYMPH%: 34.3 % (ref 14.0–49.0)
MCH: 29.2 pg (ref 27.2–33.4)
MCHC: 32.3 g/dL (ref 32.0–36.0)
MCV: 90.5 fL (ref 79.3–98.0)
MONO#: 0.5 10*3/uL (ref 0.1–0.9)
MONO%: 13.1 % (ref 0.0–14.0)
NEUT%: 48 % (ref 39.0–75.0)
NEUTROS ABS: 1.9 10*3/uL (ref 1.5–6.5)
Platelets: 77 10*3/uL — ABNORMAL LOW (ref 140–400)
RBC: 4.31 10*6/uL (ref 4.20–5.82)
RDW: 15.9 % — AB (ref 11.0–14.6)
WBC: 3.9 10*3/uL — AB (ref 4.0–10.3)
lymph#: 1.3 10*3/uL (ref 0.9–3.3)

## 2017-06-22 LAB — FERRITIN: FERRITIN: 264 ng/mL (ref 22–316)

## 2017-06-22 MED ORDER — SODIUM CHLORIDE 0.9 % IV SOLN
2000.0000 mg/m2 | INTRAVENOUS | Status: DC
  Administered 2017-06-22: 3500 mg via INTRAVENOUS
  Filled 2017-06-22: qty 70

## 2017-06-22 MED ORDER — DEXTROSE 5 % IV SOLN
Freq: Once | INTRAVENOUS | Status: AC
  Administered 2017-06-22: 12:00:00 via INTRAVENOUS

## 2017-06-22 MED ORDER — OXYCODONE HCL 10 MG PO TABS: 10.0000 mg | ORAL_TABLET | Freq: Four times a day (QID) | ORAL | 0 refills | Status: DC | PRN

## 2017-06-22 MED ORDER — SODIUM CHLORIDE 0.9% FLUSH
10.0000 mL | INTRAVENOUS | Status: DC | PRN
  Administered 2017-06-22: 10 mL via INTRAVENOUS
  Filled 2017-06-22: qty 10

## 2017-06-22 MED ORDER — DEXAMETHASONE SODIUM PHOSPHATE 10 MG/ML IJ SOLN
10.0000 mg | Freq: Once | INTRAMUSCULAR | Status: AC
Start: 2017-06-22 — End: 2017-06-22
  Administered 2017-06-22: 10 mg via INTRAVENOUS

## 2017-06-22 MED ORDER — DEXAMETHASONE SODIUM PHOSPHATE 10 MG/ML IJ SOLN
INTRAMUSCULAR | Status: AC
  Filled 2017-06-22: qty 1

## 2017-06-22 MED ORDER — SODIUM CHLORIDE 0.9 % IV SOLN
Freq: Once | INTRAVENOUS | Status: AC
Start: 2017-06-22 — End: 2017-06-22
  Administered 2017-06-22: 10:00:00 via INTRAVENOUS

## 2017-06-22 MED ORDER — MIRTAZAPINE 15 MG PO TABS: 15.0000 mg | ORAL_TABLET | Freq: Every day | ORAL | 2 refills | Status: DC

## 2017-06-22 MED ORDER — LEUCOVORIN CALCIUM INJECTION 350 MG
400.0000 mg/m2 | Freq: Once | INTRAVENOUS | Status: AC
  Administered 2017-06-22: 704 mg via INTRAVENOUS
  Filled 2017-06-22: qty 35.2

## 2017-06-22 MED ORDER — PALONOSETRON HCL INJECTION 0.25 MG/5ML
INTRAVENOUS | Status: AC
  Filled 2017-06-22: qty 5

## 2017-06-22 MED ORDER — SODIUM CHLORIDE 0.9 % IV SOLN
6.5000 mg/kg | Freq: Once | INTRAVENOUS | Status: AC
  Administered 2017-06-22: 400 mg via INTRAVENOUS
  Filled 2017-06-22: qty 20

## 2017-06-22 MED ORDER — OXALIPLATIN CHEMO INJECTION 100 MG/20ML
48.0000 mg/m2 | Freq: Once | INTRAVENOUS | Status: AC
  Administered 2017-06-22: 85 mg via INTRAVENOUS
  Filled 2017-06-22: qty 17

## 2017-06-22 MED ORDER — PALONOSETRON HCL INJECTION 0.25 MG/5ML
0.2500 mg | Freq: Once | INTRAVENOUS | Status: AC
  Administered 2017-06-22: 0.25 mg via INTRAVENOUS

## 2017-06-22 MED FILL — MIRTAZAPINE 15 MG TABLET: 15 | 30 days supply | Qty: 30 | Fill #0

## 2017-06-22 MED FILL — oxyCODONE HCL 10 MG TABS: 10 | 15 days supply | Qty: 60 | Fill #0

## 2017-06-22 NOTE — Progress Notes (Signed)
Ok to treat with platelets of 77 per Dr Jana Hakim; will dose reduce Oxaliplatin per Dr Jana Hakim.

## 2017-06-22 NOTE — Congregational Nurse Program (Signed)
Congregational Nurse Program Note  Date of Encounter: 06/16/2017  Past Medical History: Past Medical History:  Diagnosis Date  . Malignant neoplasm of abdomen ([place])    [name] 01/11/2017  . Microcytic anemia    /notes 01/11/2017    Encounter Details:     CNP Questionnaire - 06/22/17 1848      Patient Demographics   Is this a new or existing patient? Existing   Patient is considered a/an Not Applicable   Race African-American/Black     Patient Assistance   Location of Patient Assistance HOPES patient   Patient's financial/insurance status Medicaid   Uninsured Patient ([facility] Card/Care Connects) No   Patient referred to apply for the following financial assistance Not Applicable   Food insecurities addressed Not Applicable   Transportation assistance No   Assistance securing medications No   Educational health offerings Chronic disease;Navigating the healthcare system;Cancer     Encounter Details   Primary purpose of visit Education/Health Concerns;Spiritual Care/Support Visit;Post PCP Visit   Was an Emergency Department visit averted? Not Applicable   Does patient have a medical provider? Yes   Patient referred to Follow up with established PCP   Was a mental health screening completed? (GAINS tool) No   Does patient have dental issues? No   Does patient have vision issues? No   Does your patient have an abnormal blood pressure today? No   Since previous encounter, have you referred patient for abnormal blood pressure that resulted in a new diagnosis or medication change? No   Does your patient have an abnormal blood glucose today? No   Since previous encounter, have you referred patient for abnormal blood glucose that resulted in a new diagnosis or medication change? No   Was there a life-saving intervention made? No    Joint visit by HOPES team . 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 Team  visited with him  outside under the shade tree. States he was up and out  today helping someone move and would be paid for the use of his truck. Nurse informed client his extension was approved . He states he is close to closing on a place of his own ,feels this will be a good fit for him. State she will need help with furniture . We will submit an application to  [place] once he secures housing.This place he will have to pay water and lights everything else included. .States he is taking his medications ,no side effects ,appetite good  This week and he talked with MD about the shot  He was to take and everything went well. SW will continue to work on his  Housing issues with his daughter.and any other resources available. Next visit  06-24-17 at  6 pm

## 2017-06-22 NOTE — Patient Instructions (Signed)
[facility] Discharge Instructions for Patients Receiving Chemotherapy  Today you received the following chemotherapy agents Vectibix, Oxaliplatin, Leucovorin and 5FU.  To help prevent nausea and vomiting after your treatment, we encourage you to take your nausea medication.   If you develop nausea and vomiting that is not controlled by your nausea medication, call the clinic.   BELOW ARE SYMPTOMS THAT SHOULD BE REPORTED IMMEDIATELY:  *FEVER GREATER THAN 100.5 F  *CHILLS WITH OR WITHOUT FEVER  NAUSEA AND VOMITING THAT IS NOT CONTROLLED WITH YOUR NAUSEA MEDICATION  *UNUSUAL SHORTNESS OF BREATH  *UNUSUAL BRUISING OR BLEEDING  TENDERNESS IN MOUTH AND THROAT WITH OR WITHOUT PRESENCE OF ULCERS  *URINARY PROBLEMS  *BOWEL PROBLEMS  UNUSUAL RASH Items with * indicate a potential emergency and should be followed up as soon as possible.  Feel free to call the clinic you have any questions or concerns. The clinic phone number is [phone].  Please show the [facility] at check-in to the Emergency Department and triage nurse.

## 2017-06-22 NOTE — Assessment & Plan Note (Signed)
Jeff [name] is doing well.  He will proceed with treatment today.  Due to his platelet count of 77, I reviewed it with Dr. Jana Hakim.  He will dose reduce Oxaliplatin by 20% and proceed with treatment.  I refilled his oxycodone today #60.  He will return in one week for lab only and in 2 weeks for lab, follow up with [name], and his next chemo.

## 2017-06-22 NOTE — Patient Instructions (Signed)

## 2017-06-24 ENCOUNTER — Ambulatory Visit: Payer: Medicaid Other

## 2017-06-24 ENCOUNTER — Ambulatory Visit (HOSPITAL_BASED_OUTPATIENT_CLINIC_OR_DEPARTMENT_OTHER): Payer: Medicaid Other

## 2017-06-24 DIAGNOSIS — Z5189 Encounter for other specified aftercare: Secondary | ICD-10-CM | POA: Diagnosis not present

## 2017-06-24 DIAGNOSIS — E0800 Diabetes mellitus due to underlying condition with hyperosmolarity without nonketotic hyperglycemic-hyperosmolar coma (NKHHC): Secondary | ICD-10-CM

## 2017-06-24 DIAGNOSIS — C186 Malignant neoplasm of descending colon: Secondary | ICD-10-CM

## 2017-06-24 DIAGNOSIS — Z452 Encounter for adjustment and management of vascular access device: Secondary | ICD-10-CM | POA: Diagnosis present

## 2017-06-24 DIAGNOSIS — D500 Iron deficiency anemia secondary to blood loss (chronic): Secondary | ICD-10-CM

## 2017-06-24 DIAGNOSIS — Z95828 Presence of other vascular implants and grafts: Secondary | ICD-10-CM

## 2017-06-24 MED ORDER — HEPARIN SOD (PORK) LOCK FLUSH 100 UNIT/ML IV SOLN
500.0000 [IU] | INTRAVENOUS | Status: DC | PRN
  Administered 2017-06-24: 500 [IU] via INTRAVENOUS
  Filled 2017-06-24: qty 5

## 2017-06-24 MED ORDER — SODIUM CHLORIDE 0.9% FLUSH
10.0000 mL | INTRAVENOUS | Status: DC | PRN
  Administered 2017-06-24: 10 mL via INTRAVENOUS
  Filled 2017-06-24: qty 10

## 2017-06-24 MED ORDER — PEGFILGRASTIM INJECTION 6 MG/0.6ML ~~LOC~~
6.0000 mg | PREFILLED_SYRINGE | Freq: Once | SUBCUTANEOUS | Status: AC
  Administered 2017-06-24: 6 mg via SUBCUTANEOUS
  Filled 2017-06-24: qty 0.6

## 2017-06-24 NOTE — Patient Instructions (Signed)
Central Line, Adult A central line is a thin, flexible tube (catheter) that is put in your vein. It can be used to:  Take blood for lab tests.  Give you medicine.  Give you food and nutrients.  The procedure may vary among doctors and hospitals. Follow these instructions at home: Caring for the tube  Follow instructions from your doctor about: ? Flushing the tube with saline solution. ? Cleaning the tube and the area around it.  Only flush with clean (sterile) supplies. The supplies should be from your doctor, a pharmacy, or another place that your doctor recommends.  Before you flush the tube or clean the area around the tube: ? Wash your hands with soap and water. If you cannot use soap and water, use hand sanitizer. ? Clean the central line hub with rubbing alcohol. Caring for your skin  Keep the area where the tube was put in clean and dry.  Every day, and when changing the bandage, check the skin around the central line for: ? Redness, swelling, or pain. ? Fluid or blood. ? Warmth. ? Pus. ? A bad smell. General instructions  Keep the tube clamped, unless it is being used.  Keep your supplies in a clean, dry location.  If you or someone else accidentally pulls on the tube, make sure: ? The bandage (dressing) is okay. ? There is no bleeding. ? The tube has not been pulled out.  Do not use scissors or sharp objects near the tube.  Do not swim or let the tube soak in a tub.  Ask your doctor what activities are safe for you. Your doctor may tell you not to lift anything or move your arm too much.  Take over-the-counter and prescription medicines only as told by your doctor.  Change bandages as told by your doctor.  Keep your bandage dry. If a bandage gets wet, have it changed right away.  Keep all follow-up visits as told by your doctor. This is important. Throwing away supplies  Throw away any syringes in a trash (disposal) container that is only for sharp  items (sharps container). You can buy a sharps container from a pharmacy, or you can make one by using an empty hard plastic bottle with a cover.  Place any used bandages or infusion bags into a plastic bag. Throw that bag in the trash. Contact a doctor if:  You have any of these where the tube was put in: ? Redness, swelling, or pain. ? Fluid or blood. ? A warm feeling. ? Pus or a bad smell. Get help right away if:  You have: ? A fever. ? Chills. ? Trouble getting enough air (shortness of breath). ? Trouble breathing. ? Pain in your chest. ? Swelling in your neck, face, chest, or arm.  You are coughing.  You feel your heart beating fast or skipping beats.  You feel dizzy or you pass out (faint).  There are red lines coming from where the tube was put in.  The area where the tube was put in is bleeding and the bleeding will not stop.  Your tube is hard to flush.  You do not get a blood return from the tube.  The tube gets loose or comes out.  The tube has a hole or a tear.  The tube leaks. Summary  A central line is a thin, flexible tube (catheter) that is put in your vein. It can be used to take blood for lab tests or to   give you medicine.  Follow instructions from your doctor about flushing and cleaning the tube.  Keep the area where the tube was put in clean and dry.  Ask your doctor what activities are safe for you. This information is not intended to replace advice given to you by your health care provider. Make sure you discuss any questions you have with your health care provider. Document Released: 11/30/2012 Document Revised: 12/31/2016 Document Reviewed: 12/31/2016 Elsevier Interactive Patient Education  2017 Elsevier Inc.   Pegfilgrastim injection What is this medicine? PEGFILGRASTIM (PEG fil gra stim) is a long-acting granulocyte colony-stimulating factor that stimulates the growth of neutrophils, a type of white blood cell important in the body's  fight against infection. It is used to reduce the incidence of fever and infection in patients with certain types of cancer who are receiving chemotherapy that affects the bone marrow, and to increase survival after being exposed to high doses of radiation. This medicine may be used for other purposes; ask your health care provider or pharmacist if you have questions. COMMON BRAND NAME(S): Neulasta What should I tell my health care provider before I take this medicine? They need to know if you have any of these conditions: -kidney disease -latex allergy -ongoing radiation therapy -sickle cell disease -skin reactions to acrylic adhesives (On-Body Injector only) -an unusual or allergic reaction to pegfilgrastim, filgrastim, other medicines, foods, dyes, or preservatives -pregnant or trying to get pregnant -breast-feeding How should I use this medicine? This medicine is for injection under the skin. If you get this medicine at home, you will be taught how to prepare and give the pre-filled syringe or how to use the On-body Injector. Refer to the patient Instructions for Use for detailed instructions. Use exactly as directed. Tell your healthcare provider immediately if you suspect that the On-body Injector may not have performed as intended or if you suspect the use of the On-body Injector resulted in a missed or partial dose. It is important that you put your used needles and syringes in a special sharps container. Do not put them in a trash can. If you do not have a sharps container, call your pharmacist or healthcare provider to get one. Talk to your pediatrician regarding the use of this medicine in children. While this drug may be prescribed for selected conditions, precautions do apply. Overdosage: If you think you have taken too much of this medicine contact a poison control center or emergency room at once. NOTE: This medicine is only for you. Do not share this medicine with others. What if I  miss a dose? It is important not to miss your dose. Call your doctor or health care professional if you miss your dose. If you miss a dose due to an On-body Injector failure or leakage, a new dose should be administered as soon as possible using a single prefilled syringe for manual use. What may interact with this medicine? Interactions have not been studied. Give your health care provider a list of all the medicines, herbs, non-prescription drugs, or dietary supplements you use. Also tell them if you smoke, drink alcohol, or use illegal drugs. Some items may interact with your medicine. This list may not describe all possible interactions. Give your health care provider a list of all the medicines, herbs, non-prescription drugs, or dietary supplements you use. Also tell them if you smoke, drink alcohol, or use illegal drugs. Some items may interact with your medicine. What should I watch for while using this medicine?   You may need blood work done while you are taking this medicine. If you are going to need a MRI, CT scan, or other procedure, tell your doctor that you are using this medicine (On-Body Injector only). What side effects may I notice from receiving this medicine? Side effects that you should report to your doctor or health care professional as soon as possible: -allergic reactions like skin rash, itching or hives, swelling of the face, lips, or tongue -dizziness -fever -pain, redness, or irritation at site where injected -pinpoint red spots on the skin -red or dark-brown urine -shortness of breath or breathing problems -stomach or side pain, or pain at the shoulder -swelling -tiredness -trouble passing urine or change in the amount of urine Side effects that usually do not require medical attention (report to your doctor or health care professional if they continue or are bothersome): -bone pain -muscle pain This list may not describe all possible side effects. Call your doctor  for medical advice about side effects. You may report side effects to FDA at 1-800-FDA-1088. Where should I keep my medicine? Keep out of the reach of children. Store pre-filled syringes in a refrigerator between 2 and 8 degrees C (36 and 46 degrees F). Do not freeze. Keep in carton to protect from light. Throw away this medicine if it is left out of the refrigerator for more than 48 hours. Throw away any unused medicine after the expiration date. NOTE: This sheet is a summary. It may not cover all possible information. If you have questions about this medicine, talk to your doctor, pharmacist, or health care provider.  2018 Elsevier/Gold Standard (2016-12-10 12:58:03)  

## 2017-06-24 NOTE — Progress Notes (Signed)
Neulasta injection was given by Flush Room #2

## 2017-06-25 ENCOUNTER — Ambulatory Visit: Payer: Medicaid Other

## 2017-06-29 ENCOUNTER — Other Ambulatory Visit (HOSPITAL_BASED_OUTPATIENT_CLINIC_OR_DEPARTMENT_OTHER): Payer: Medicaid Other

## 2017-06-29 DIAGNOSIS — C762 Malignant neoplasm of abdomen: Secondary | ICD-10-CM

## 2017-06-29 DIAGNOSIS — C186 Malignant neoplasm of descending colon: Secondary | ICD-10-CM

## 2017-06-29 LAB — COMPREHENSIVE METABOLIC PANEL
ALK PHOS: 124 U/L (ref 40–150)
ALT: 15 U/L (ref 0–55)
ANION GAP: 12 meq/L — AB (ref 3–11)
AST: 29 U/L (ref 5–34)
Albumin: 3.4 g/dL — ABNORMAL LOW (ref 3.5–5.0)
BILIRUBIN TOTAL: 0.51 mg/dL (ref 0.20–1.20)
BUN: 6.4 mg/dL — ABNORMAL LOW (ref 7.0–26.0)
CO2: 23 meq/L (ref 22–29)
Calcium: 9.8 mg/dL (ref 8.4–10.4)
Chloride: 99 mEq/L (ref 98–109)
Creatinine: 1 mg/dL (ref 0.7–1.3)
Glucose: 97 mg/dl (ref 70–140)
Potassium: 3.7 mEq/L (ref 3.5–5.1)
Sodium: 134 mEq/L — ABNORMAL LOW (ref 136–145)
TOTAL PROTEIN: 8.7 g/dL — AB (ref 6.4–8.3)

## 2017-06-29 LAB — CBC WITH DIFFERENTIAL/PLATELET
BASO%: 0.5 % (ref 0.0–2.0)
BASOS ABS: 0.1 10*3/uL (ref 0.0–0.1)
EOS ABS: 0.2 10*3/uL (ref 0.0–0.5)
EOS%: 0.8 % (ref 0.0–7.0)
HCT: 39 % (ref 38.4–49.9)
HGB: 12.6 g/dL — ABNORMAL LOW (ref 13.0–17.1)
LYMPH%: 14.6 % (ref 14.0–49.0)
MCH: 28.8 pg (ref 27.2–33.4)
MCHC: 32.3 g/dL (ref 32.0–36.0)
MCV: 89 fL (ref 79.3–98.0)
MONO#: 2.7 10*3/uL — AB (ref 0.1–0.9)
MONO%: 13.5 % (ref 0.0–14.0)
NEUT%: 70.6 % (ref 39.0–75.0)
NEUTROS ABS: 14.1 10*3/uL — AB (ref 1.5–6.5)
PLATELETS: 92 10*3/uL — AB (ref 140–400)
RBC: 4.38 10*6/uL (ref 4.20–5.82)
RDW: 15.3 % — ABNORMAL HIGH (ref 11.0–14.6)
WBC: 20 10*3/uL — ABNORMAL HIGH (ref 4.0–10.3)
lymph#: 2.9 10*3/uL (ref 0.9–3.3)

## 2017-06-29 NOTE — Congregational Nurse Program (Signed)
Congregational Nurse Program Note  Date of Encounter: 06/24/2017  Past Medical History: Past Medical History:  Diagnosis Date  . 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 Microcytic anemia    /notes 01/11/2017    Encounter Details:     CNP Questionnaire - 06/29/17 1623      Patient Demographics   Is this a new or existing patient? Existing   Race African-American/Black     Patient Assistance   Location of Patient Assistance Not Applicable   Patient's financial/insurance status Medicaid   Uninsured Patient ([facility]) No   Patient referred to apply for the following financial assistance Not Applicable   Food insecurities addressed Not Applicable   Transportation assistance No   Assistance securing medications No   Educational health offerings Cancer;Medications     Encounter Details   Primary purpose of visit Spiritual Care/Support Visit;Education/Health Concerns;Chronic Illness/Condition Visit   Was an Emergency Department visit averted? Not Applicable   Does patient have a medical provider? Yes   Patient referred to Clinic   Was a mental health screening completed? (GAINS tool) No   Does patient have dental issues? No   Does patient have vision issues? No   Does your patient have an abnormal blood pressure today? No   Since previous encounter, have you referred patient for abnormal blood pressure that resulted in a new diagnosis or medication change? No   Does your patient have an abnormal blood glucose today? No   Since previous encounter, have you referred patient for abnormal blood glucose that resulted in a new diagnosis or medication change? No   Was there a life-saving intervention made? No     [employer]  Team visiting today at [place] because clients  Truck broke  Down and he is replacing the starter on it  Himself to save him money . Was in good  [place] ,still waiting on okay for housing unit  That he has submitted an  application for . Has an appointment at  [facility] next week to see about his  Increase once he secures his own place in order to  Pay his  Rent and have some money left to help with utilities. Rent would  Go from $550 per month to  $750 per month and he feels he  Can make it on that. Daughter and  SW in contact weekly working  Together on housing issues. Sw made a list of  [employer] and gave to client to use for  Future in case he  Needs help.  Hoping he will get word on housing net week . Treatments again next week , no problems at the present with medication and appetite . Client does not need PCOT ,not a diabetic  Next visit  07-01-17 @6  pm

## 2017-07-02 MED FILL — PROCHLORPERAZINE 10 MG TAB: 10 | 7 days supply | Qty: 30 | Fill #1

## 2017-07-02 MED FILL — XARELTO 20 MG TABLET: 20 | 30 days supply | Qty: 30 | Fill #1

## 2017-07-02 MED FILL — ONDANSETRON HCL 8 MG TAB: 8 | 15 days supply | Qty: 30 | Fill #1

## 2017-07-05 ENCOUNTER — Ambulatory Visit (HOSPITAL_COMMUNITY)
Admission: RE | Admit: 2017-07-05 | Discharge: 2017-07-05 | Disposition: A | Discharge status: Home / Self Care | Payer: Medicaid Other | Source: Ambulatory Visit | Attending: Hematology | Admitting: Hematology

## 2017-07-05 DIAGNOSIS — R591 Generalized enlarged lymph nodes: Secondary | ICD-10-CM | POA: Diagnosis not present

## 2017-07-05 DIAGNOSIS — C186 Malignant neoplasm of descending colon: Secondary | ICD-10-CM | POA: Diagnosis not present

## 2017-07-05 DIAGNOSIS — I700 Atherosclerosis of aorta: Secondary | ICD-10-CM | POA: Insufficient documentation

## 2017-07-05 MED ORDER — IOPAMIDOL (ISOVUE-300) INJECTION 61%
100.0000 mL | Freq: Once | INTRAVENOUS | Status: AC | PRN
  Administered 2017-07-05: 100 mL via INTRAVENOUS

## 2017-07-05 MED ORDER — IOPAMIDOL (ISOVUE-300) INJECTION 61%
INTRAVENOUS | Status: AC
  Filled 2017-07-05: qty 100
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
## 2017-07-06 ENCOUNTER — Telehealth: Payer: Self-pay | Admitting: Hematology

## 2017-07-06 ENCOUNTER — Ambulatory Visit (HOSPITAL_BASED_OUTPATIENT_CLINIC_OR_DEPARTMENT_OTHER): Payer: Medicaid Other

## 2017-07-06 ENCOUNTER — Other Ambulatory Visit (HOSPITAL_BASED_OUTPATIENT_CLINIC_OR_DEPARTMENT_OTHER): Payer: Medicaid Other

## 2017-07-06 ENCOUNTER — Encounter: Payer: Self-pay | Admitting: Hematology

## 2017-07-06 ENCOUNTER — Ambulatory Visit (HOSPITAL_BASED_OUTPATIENT_CLINIC_OR_DEPARTMENT_OTHER): Payer: Medicaid Other | Admitting: Hematology

## 2017-07-06 VITALS — BP 103/68 | HR 88 | Temp 98.3°F | Resp 20 | Ht 69.0 in | Wt 131.9 lb

## 2017-07-06 DIAGNOSIS — Z452 Encounter for adjustment and management of vascular access device: Secondary | ICD-10-CM | POA: Diagnosis present

## 2017-07-06 DIAGNOSIS — G893 Neoplasm related pain (acute) (chronic): Secondary | ICD-10-CM

## 2017-07-06 DIAGNOSIS — C186 Malignant neoplasm of descending colon: Secondary | ICD-10-CM

## 2017-07-06 DIAGNOSIS — Z5112 Encounter for antineoplastic immunotherapy: Secondary | ICD-10-CM

## 2017-07-06 DIAGNOSIS — E46 Unspecified protein-calorie malnutrition: Secondary | ICD-10-CM

## 2017-07-06 DIAGNOSIS — C786 Secondary malignant neoplasm of retroperitoneum and peritoneum: Secondary | ICD-10-CM | POA: Diagnosis not present

## 2017-07-06 DIAGNOSIS — R634 Abnormal weight loss: Secondary | ICD-10-CM

## 2017-07-06 DIAGNOSIS — D630 Anemia in neoplastic disease: Secondary | ICD-10-CM | POA: Diagnosis not present

## 2017-07-06 DIAGNOSIS — I82411 Acute embolism and thrombosis of right femoral vein: Secondary | ICD-10-CM

## 2017-07-06 DIAGNOSIS — D6959 Other secondary thrombocytopenia: Secondary | ICD-10-CM

## 2017-07-06 DIAGNOSIS — C762 Malignant neoplasm of abdomen: Secondary | ICD-10-CM

## 2017-07-06 DIAGNOSIS — Z5111 Encounter for antineoplastic chemotherapy: Secondary | ICD-10-CM | POA: Diagnosis not present

## 2017-07-06 DIAGNOSIS — Z95828 Presence of other vascular implants and grafts: Secondary | ICD-10-CM

## 2017-07-06 DIAGNOSIS — D500 Iron deficiency anemia secondary to blood loss (chronic): Secondary | ICD-10-CM

## 2017-07-06 LAB — CBC WITH DIFFERENTIAL/PLATELET
BASO%: 0.9 % (ref 0.0–2.0)
BASOS ABS: 0.1 10*3/uL (ref 0.0–0.1)
EOS ABS: 0.1 10*3/uL (ref 0.0–0.5)
EOS%: 0.8 % (ref 0.0–7.0)
HCT: 39.2 % (ref 38.4–49.9)
HEMOGLOBIN: 12.8 g/dL — AB (ref 13.0–17.1)
LYMPH%: 14.9 % (ref 14.0–49.0)
MCH: 28.8 pg (ref 27.2–33.4)
MCHC: 32.6 g/dL (ref 32.0–36.0)
MCV: 88.2 fL (ref 79.3–98.0)
MONO#: 0.9 10*3/uL (ref 0.1–0.9)
MONO%: 7.7 % (ref 0.0–14.0)
NEUT#: 9.3 10*3/uL — ABNORMAL HIGH (ref 1.5–6.5)
NEUT%: 75.7 % — AB (ref 39.0–75.0)
Platelets: 84 10*3/uL — ABNORMAL LOW (ref 140–400)
RBC: 4.44 10*6/uL (ref 4.20–5.82)
RDW: 15.5 % — AB (ref 11.0–14.6)
WBC: 12.3 10*3/uL — ABNORMAL HIGH (ref 4.0–10.3)
lymph#: 1.8 10*3/uL (ref 0.9–3.3)

## 2017-07-06 LAB — COMPREHENSIVE METABOLIC PANEL
ALK PHOS: 100 U/L (ref 40–150)
ALT: 7 U/L (ref 0–55)
ANION GAP: 9 meq/L (ref 3–11)
AST: 22 U/L (ref 5–34)
Albumin: 2.9 g/dL — ABNORMAL LOW (ref 3.5–5.0)
BUN: 5.1 mg/dL — ABNORMAL LOW (ref 7.0–26.0)
CALCIUM: 9.3 mg/dL (ref 8.4–10.4)
CO2: 23 mEq/L (ref 22–29)
Chloride: 102 mEq/L (ref 98–109)
Creatinine: 0.8 mg/dL (ref 0.7–1.3)
Glucose: 102 mg/dl (ref 70–140)
POTASSIUM: 3.6 meq/L (ref 3.5–5.1)
Sodium: 134 mEq/L — ABNORMAL LOW (ref 136–145)
Total Bilirubin: 0.59 mg/dL (ref 0.20–1.20)
Total Protein: 8 g/dL (ref 6.4–8.3)

## 2017-07-06 LAB — MAGNESIUM: Magnesium: 1.3 mg/dl — CL (ref 1.5–2.5)

## 2017-07-06 MED ORDER — DEXAMETHASONE SODIUM PHOSPHATE 10 MG/ML IJ SOLN
10.0000 mg | Freq: Once | INTRAMUSCULAR | Status: AC
Start: 2017-07-06 — End: 2017-07-06
  Administered 2017-07-06: 10 mg via INTRAVENOUS

## 2017-07-06 MED ORDER — SODIUM CHLORIDE 0.9% FLUSH
10.0000 mL | INTRAVENOUS | Status: DC | PRN
  Administered 2017-07-06: 10 mL via INTRAVENOUS
  Filled 2017-07-06: qty 10

## 2017-07-06 MED ORDER — PALONOSETRON HCL INJECTION 0.25 MG/5ML
INTRAVENOUS | Status: AC
  Filled 2017-07-06: qty 5

## 2017-07-06 MED ORDER — PALONOSETRON HCL INJECTION 0.25 MG/5ML
0.2500 mg | Freq: Once | INTRAVENOUS | Status: AC
  Administered 2017-07-06: 0.25 mg via INTRAVENOUS

## 2017-07-06 MED ORDER — FLUOROURACIL CHEMO INJECTION 5 GM/100ML
2000.0000 mg/m2 | INTRAVENOUS | Status: DC
  Administered 2017-07-06: 3500 mg via INTRAVENOUS
  Filled 2017-07-06: qty 70

## 2017-07-06 MED ORDER — LEUCOVORIN CALCIUM INJECTION 350 MG
400.0000 mg/m2 | Freq: Once | INTRAVENOUS | Status: AC
  Administered 2017-07-06: 704 mg via INTRAVENOUS
  Filled 2017-07-06: qty 35.2

## 2017-07-06 MED ORDER — DEXAMETHASONE SODIUM PHOSPHATE 10 MG/ML IJ SOLN
INTRAMUSCULAR | Status: AC
  Filled 2017-07-06: qty 1

## 2017-07-06 MED ORDER — DEXTROSE 5 % IV SOLN: Freq: Once | INTRAVENOUS | Status: DC

## 2017-07-06 MED ORDER — SODIUM CHLORIDE 0.9 % IV SOLN
Freq: Once | INTRAVENOUS | Status: AC
  Administered 2017-07-06: 10:00:00 via INTRAVENOUS

## 2017-07-06 MED ORDER — PANITUMUMAB CHEMO INJECTION 100 MG/5ML
6.5000 mg/kg | Freq: Once | INTRAVENOUS | Status: AC
  Administered 2017-07-06: 400 mg via INTRAVENOUS
  Filled 2017-07-06: qty 20

## 2017-07-06 NOTE — Patient Instructions (Signed)
[facility] Discharge Instructions for Patients Receiving Chemotherapy  Today you received the following chemotherapy agents Vectibix,  Leucovorin and 5FU.  To help prevent nausea and vomiting after your treatment, we encourage you to take your nausea medication.   If you develop nausea and vomiting that is not controlled by your nausea medication, call the clinic.   BELOW ARE SYMPTOMS THAT SHOULD BE REPORTED IMMEDIATELY:  *FEVER GREATER THAN 100.5 F  *CHILLS WITH OR WITHOUT FEVER  NAUSEA AND VOMITING THAT IS NOT CONTROLLED WITH YOUR NAUSEA MEDICATION  *UNUSUAL SHORTNESS OF BREATH  *UNUSUAL BRUISING OR BLEEDING  TENDERNESS IN MOUTH AND THROAT WITH OR WITHOUT PRESENCE OF ULCERS  *URINARY PROBLEMS  *BOWEL PROBLEMS  UNUSUAL RASH Items with * indicate a potential emergency and should be followed up as soon as possible.  Feel free to call the clinic you have any questions or concerns. The clinic phone number is [phone].  Please show the [facility] at check-in to the Emergency Department and triage nurse.

## 2017-07-06 NOTE — Telephone Encounter (Signed)
Gave patient avs report and appointments for July and August.  °

## 2017-07-06 NOTE — Congregational Nurse Program (Signed)
Congregational Nurse Program Note  Date of Encounter: 07/01/2017  Past Medical History: Past Medical History:  Diagnosis Date  . Malignant neoplasm of abdomen ([place])    [name] 01/11/2017  . Microcytic anemia    /notes 01/11/2017    Encounter Details:     CNP Questionnaire - 07/06/17 1617      Patient Demographics   Is this a new or existing patient? Existing   Patient is considered a/an Not Applicable   Race African-American/Black     Patient Assistance   Location of Patient Assistance [facility] patient   Patient's financial/insurance status Medicaid   Uninsured Patient ([facility]) No   Patient referred to apply for the following financial assistance Not Applicable   Food insecurities addressed Not Applicable   Transportation assistance No   Assistance securing medications No   Educational health offerings Cancer;Medications     Encounter Details   Primary purpose of visit Spiritual Care/Support Visit;Education/Health Concerns;Chronic Illness/Condition Visit   Was an Emergency Department visit averted? Not Applicable   Does patient have a medical provider? Yes   Patient referred to Clinic;Follow up with established PCP   Was a mental health screening completed? (GAINS tool) No   Does patient have dental issues? No   Does patient have vision issues? No   Does your patient have an abnormal blood pressure today? No   Since previous encounter, have you referred patient for abnormal blood pressure that resulted in a new diagnosis or medication change? No   Does your patient have an abnormal blood glucose today? No   Since previous encounter, have you referred patient for abnormal blood glucose that resulted in a new diagnosis or medication change? No   Was there a life-saving intervention made? No     Joint [facility] team visit at [place] . Client has been very upset the last 4-5 days no hot water at the [facility] where he is staying . He states I cant shower in cold   Water ,I cant even drink anything cold any more as my body cant take it. Nurse informed client that I had  Talked with management at the [facility] and my supervisory staff and that both are very sorry for this situation but there is  No place to move him and the part for the motel air conditioner unit should be in any day . Nurse talked with him about  warming him some water in a pot  To take a bath with for just a little while  Longer . Not happy with that  Idea . States he is showering at a friends house for now  But  That is  Not the best solution. Client must  wait this out and hopefully  The air  Conditioning system will be fixed within the next day or so .  Otherwise doing well ,taking medications ,appetite good ,still waiting on housing apartment ,states they are doing some reno vacations .and they should  Be complete in the next week or so ,then we will complete application to assist with furniture.   Social worker continues to work with clients  Daughter on housing . Client has appointment with  [facility] next week ,cancelled and  reschedule  The one this week because he had no hot water couldn't take a bath he states. Client to call if he needs anything

## 2017-07-06 NOTE — Patient Instructions (Signed)
Implanted Port Home Guide An implanted port is a type of central line that is placed under the skin. Central lines are used to provide IV access when treatment or nutrition needs to be given through a person's veins. Implanted ports are used for long-term IV access. An implanted port may be placed because:  You need IV medicine that would be irritating to the small veins in your hands or arms.  You need long-term IV medicines, such as antibiotics.  You need IV nutrition for a long period.  You need frequent blood draws for lab tests.  You need dialysis.  Implanted ports are usually placed in the chest area, but they can also be placed in the upper arm, the abdomen, or the leg. An implanted port has two main parts:  Reservoir. The reservoir is round and will appear as a small, raised area under your skin. The reservoir is the part where a needle is inserted to give medicines or draw blood.  Catheter. The catheter is a thin, flexible tube that extends from the reservoir. The catheter is placed into a large vein. Medicine that is inserted into the reservoir goes into the catheter and then into the vein.  How will I care for my incision site? Do not get the incision site wet. Bathe or shower as directed by your health care provider. How is my port accessed? Special steps must be taken to access the port:  Before the port is accessed, a numbing cream can be placed on the skin. This helps numb the skin over the port site.  Your health care provider uses a sterile technique to access the port. ? Your health care provider must put on a mask and sterile gloves. ? The skin over your port is cleaned carefully with an antiseptic and allowed to dry. ? The port is gently pinched between sterile gloves, and a needle is inserted into the port.  Only "non-coring" port needles should be used to access the port. Once the port is accessed, a blood return should be checked. This helps ensure that the port  is in the vein and is not clogged.  If your port needs to remain accessed for a constant infusion, a clear (transparent) bandage will be placed over the needle site. The bandage and needle will need to be changed every week, or as directed by your health care provider.  Keep the bandage covering the needle clean and dry. Do not get it wet. Follow your health care provider's instructions on how to take a shower or bath while the port is accessed.  If your port does not need to stay accessed, no bandage is needed over the port.  What is flushing? Flushing helps keep the port from getting clogged. Follow your health care provider's instructions on how and when to flush the port. Ports are usually flushed with saline solution or a medicine called heparin. The need for flushing will depend on how the port is used.  If the port is used for intermittent medicines or blood draws, the port will need to be flushed: ? After medicines have been given. ? After blood has been drawn. ? As part of routine maintenance.  If a constant infusion is running, the port may not need to be flushed.  How long will my port stay implanted? The port can stay in for as long as your health care provider thinks it is needed. When it is time for the port to come out, surgery will be   done to remove it. The procedure is similar to the one performed when the port was put in. When should I seek immediate medical care? When you have an implanted port, you should seek immediate medical care if:  You notice a bad smell coming from the incision site.  You have swelling, redness, or drainage at the incision site.  You have more swelling or pain at the port site or the surrounding area.  You have a fever that is not controlled with medicine.  This information is not intended to replace advice given to you by your health care provider. Make sure you discuss any questions you have with your health care provider. Document  Released: 12/14/2005 Document Revised: 05/21/2016 Document Reviewed: 08/21/2013 Elsevier Interactive Patient Education  2017 [employer]  

## 2017-07-08 ENCOUNTER — Ambulatory Visit: Payer: Medicaid Other

## 2017-07-08 ENCOUNTER — Ambulatory Visit (HOSPITAL_BASED_OUTPATIENT_CLINIC_OR_DEPARTMENT_OTHER): Payer: Medicaid Other

## 2017-07-08 VITALS — BP 86/57 | HR 110 | Temp 99.8°F | Resp 20

## 2017-07-08 DIAGNOSIS — R634 Abnormal weight loss: Secondary | ICD-10-CM | POA: Diagnosis not present

## 2017-07-08 DIAGNOSIS — E46 Unspecified protein-calorie malnutrition: Secondary | ICD-10-CM | POA: Diagnosis not present

## 2017-07-08 DIAGNOSIS — C186 Malignant neoplasm of descending colon: Secondary | ICD-10-CM | POA: Diagnosis not present

## 2017-07-08 MED ORDER — SODIUM CHLORIDE 0.9 % IV SOLN
Freq: Once | INTRAVENOUS | Status: AC
  Administered 2017-07-08: 13:00:00 via INTRAVENOUS

## 2017-07-08 MED ORDER — HEPARIN SOD (PORK) LOCK FLUSH 100 UNIT/ML IV SOLN
500.0000 [IU] | Freq: Once | INTRAVENOUS | Status: AC | PRN
  Administered 2017-07-08: 500 [IU]
  Filled 2017-07-08: qty 5

## 2017-07-08 MED ORDER — SODIUM CHLORIDE 0.9% FLUSH
10.0000 mL | INTRAVENOUS | Status: DC | PRN
  Administered 2017-07-08: 10 mL
  Filled 2017-07-08: qty 10

## 2017-07-08 MED ORDER — SODIUM CHLORIDE 0.9 % IV SOLN
Freq: Once | INTRAVENOUS | Status: AC
  Administered 2018-01-06: 12:00:00 via INTRAVENOUS

## 2017-07-08 NOTE — Progress Notes (Signed)
Per Adynn Caseres S. RN per [name] pt to receive 500 mls of NS over one hours, then recheck b/p if b/p stable okay to discharge pt, pt b/p remains hypotensive pt to receive another 500 mls of NS over one hour. Pt aware and verbalizes understanding.  1356: b/p 118/84, pt has no complaints at this time. Pt given the option to receive another 500 cc of NS or be discharged. Pt prefers to be discharged. Pt stable at discharge. Pt to call clinic with any concerns.

## 2017-07-08 NOTE — Patient Instructions (Signed)
Dehydration, Adult Dehydration is a condition in which there is not enough fluid or water in the body. This happens when you lose more fluids than you take in. Important organs, such as the kidneys, brain, and heart, cannot function without a proper amount of fluids. Any loss of fluids from the body can lead to dehydration. Dehydration can range from mild to severe. This condition should be treated right away to prevent it from becoming severe. What are the causes? This condition may be caused by:  Vomiting.  Diarrhea.  Excessive sweating, such as from heat exposure or exercise.  Not drinking enough fluid, especially: ? When ill. ? While doing activity that requires a lot of energy.  Excessive urination.  Fever.  Infection.  Certain medicines, such as medicines that cause the body to lose excess fluid (diuretics).  Inability to access safe drinking water.  Reduced physical ability to get adequate water and food.  What increases the risk? This condition is more likely to develop in people:  Who have a poorly controlled long-term (chronic) illness, such as diabetes, heart disease, or kidney disease.  Who are age 65 or older.  Who are disabled.  Who live in a place with high altitude.  Who play endurance sports.  What are the signs or symptoms? Symptoms of mild dehydration may include:  Thirst.  Dry lips.  Slightly dry mouth.  Dry, warm skin.  Dizziness. Symptoms of moderate dehydration may include:  Very dry mouth.  Muscle cramps.  Dark urine. Urine may be the color of tea.  Decreased urine production.  Decreased tear production.  Heartbeat that is irregular or faster than normal (palpitations).  Headache.  Light-headedness, especially when you stand up from a sitting position.  Fainting (syncope). Symptoms of severe dehydration may include:  Changes in skin, such as: ? Cold and clammy skin. ? Blotchy (mottled) or pale skin. ? Skin that does  not quickly return to normal after being lightly pinched and released (poor skin turgor).  Changes in body fluids, such as: ? Extreme thirst. ? No tear production. ? Inability to sweat when body temperature is high, such as in hot weather. ? Very little urine production.  Changes in vital signs, such as: ? Weak pulse. ? Pulse that is more than 100 beats a minute when sitting still. ? Rapid breathing. ? Low blood pressure.  Other changes, such as: ? Sunken eyes. ? Cold hands and feet. ? Confusion. ? Lack of energy (lethargy). ? Difficulty waking up from sleep. ? Short-term weight loss. ? Unconsciousness. How is this diagnosed? This condition is diagnosed based on your symptoms and a physical exam. Blood and urine tests may be done to help confirm the diagnosis. How is this treated? Treatment for this condition depends on the severity. Mild or moderate dehydration can often be treated at home. Treatment should be started right away. Do not wait until dehydration becomes severe. Severe dehydration is an emergency and it needs to be treated in a hospital. Treatment for mild dehydration may include:  Drinking more fluids.  Replacing salts and minerals in your blood (electrolytes) that you may have lost. Treatment for moderate dehydration may include:  Drinking an oral rehydration solution (ORS). This is a drink that helps you replace fluids and electrolytes (rehydrate). It can be found at pharmacies and retail stores. Treatment for severe dehydration may include:  Receiving fluids through an IV tube.  Receiving an electrolyte solution through a feeding tube that is passed through your nose   and into your stomach (nasogastric tube, or NG tube).  Correcting any abnormalities in electrolytes.  Treating the underlying cause of dehydration. Follow these instructions at home:  If directed by your health care provider, drink an ORS: ? Make an ORS by following instructions on the  package. ? Start by drinking small amounts, about  cup (120 mL) every 5-10 minutes. ? Slowly increase how much you drink until you have taken the amount recommended by your health care provider.  Drink enough clear fluid to keep your urine clear or pale yellow. If you were told to drink an ORS, finish the ORS first, then start slowly drinking other clear fluids. Drink fluids such as: ? Water. Do not drink only water. Doing that can lead to having too little salt (sodium) in the body (hyponatremia). ? Ice chips. ? Fruit juice that you have added water to (diluted fruit juice). ? Low-calorie sports drinks.  Avoid: ? Alcohol. ? Drinks that contain a lot of sugar. These include high-calorie sports drinks, fruit juice that is not diluted, and soda. ? Caffeine. ? Foods that are greasy or contain a lot of fat or sugar.  Take over-the-counter and prescription medicines only as told by your health care provider.  Do not take sodium tablets. This can lead to having too much sodium in the body (hypernatremia).  Eat foods that contain a healthy balance of electrolytes, such as bananas, oranges, potatoes, tomatoes, and spinach.  Keep all follow-up visits as told by your health care provider. This is important. Contact a health care provider if:  You have abdominal pain that: ? Gets worse. ? Stays in one area (localizes).  You have a rash.  You have a stiff neck.  You are more irritable than usual.  You are sleepier or more difficult to wake up than usual.  You feel weak or dizzy.  You feel very thirsty.  You have urinated only a small amount of very dark urine over 6-8 hours. Get help right away if:  You have symptoms of severe dehydration.  You cannot drink fluids without vomiting.  Your symptoms get worse with treatment.  You have a fever.  You have a severe headache.  You have vomiting or diarrhea that: ? Gets worse. ? Does not go away.  You have blood or green matter  (bile) in your vomit.  You have blood in your stool. This may cause stool to look black and tarry.  You have not urinated in 6-8 hours.  You faint.  Your heart rate while sitting still is over 100 beats a minute.  You have trouble breathing. This information is not intended to replace advice given to you by your health care provider. Make sure you discuss any questions you have with your health care provider. Document Released: 12/14/2005 Document Revised: 07/10/2016 Document Reviewed: 02/07/2016 Elsevier Interactive Patient Education  2018 Elsevier Inc.  

## 2017-07-08 NOTE — Addendum Note (Signed)
Addended by: [name] on: 07/08/2017 12:44 PM   Modules accepted: Orders

## 2017-07-08 NOTE — Progress Notes (Unsigned)
Pt was sent to infusion per [name] to receive fluids. Brodie Correll LPN

## 2017-07-09 ENCOUNTER — Inpatient Hospital Stay (HOSPITAL_COMMUNITY)
Admission: EM | Admit: 2017-07-09 | Discharge: 2017-07-14 | DRG: 871 | Disposition: A | Discharge status: Home / Self Care | Payer: Medicaid Other | Attending: Internal Medicine | Admitting: Internal Medicine

## 2017-07-09 ENCOUNTER — Encounter (HOSPITAL_COMMUNITY): Payer: Self-pay | Admitting: Oncology

## 2017-07-09 ENCOUNTER — Other Ambulatory Visit: Payer: Self-pay

## 2017-07-09 DIAGNOSIS — R1084 Generalized abdominal pain: Secondary | ICD-10-CM | POA: Diagnosis not present

## 2017-07-09 DIAGNOSIS — E43 Unspecified severe protein-calorie malnutrition: Secondary | ICD-10-CM | POA: Diagnosis not present

## 2017-07-09 DIAGNOSIS — A419 Sepsis, unspecified organism: Secondary | ICD-10-CM | POA: Diagnosis not present

## 2017-07-09 DIAGNOSIS — I959 Hypotension, unspecified: Secondary | ICD-10-CM | POA: Diagnosis present

## 2017-07-09 DIAGNOSIS — R197 Diarrhea, unspecified: Secondary | ICD-10-CM | POA: Diagnosis not present

## 2017-07-09 DIAGNOSIS — R109 Unspecified abdominal pain: Secondary | ICD-10-CM | POA: Diagnosis present

## 2017-07-09 DIAGNOSIS — Z7901 Long term (current) use of anticoagulants: Secondary | ICD-10-CM

## 2017-07-09 DIAGNOSIS — E876 Hypokalemia: Secondary | ICD-10-CM

## 2017-07-09 DIAGNOSIS — K529 Noninfective gastroenteritis and colitis, unspecified: Secondary | ICD-10-CM | POA: Diagnosis present

## 2017-07-09 DIAGNOSIS — A09 Infectious gastroenteritis and colitis, unspecified: Secondary | ICD-10-CM

## 2017-07-09 DIAGNOSIS — C182 Malignant neoplasm of ascending colon: Secondary | ICD-10-CM | POA: Diagnosis present

## 2017-07-09 DIAGNOSIS — C186 Malignant neoplasm of descending colon: Secondary | ICD-10-CM | POA: Diagnosis not present

## 2017-07-09 DIAGNOSIS — C189 Malignant neoplasm of colon, unspecified: Secondary | ICD-10-CM | POA: Diagnosis present

## 2017-07-09 DIAGNOSIS — Z809 Family history of malignant neoplasm, unspecified: Secondary | ICD-10-CM

## 2017-07-09 DIAGNOSIS — Z79899 Other long term (current) drug therapy: Secondary | ICD-10-CM

## 2017-07-09 DIAGNOSIS — C786 Secondary malignant neoplasm of retroperitoneum and peritoneum: Secondary | ICD-10-CM | POA: Diagnosis present

## 2017-07-09 DIAGNOSIS — Z681 Body mass index (BMI) 19 or less, adult: Secondary | ICD-10-CM

## 2017-07-09 DIAGNOSIS — Z86718 Personal history of other venous thrombosis and embolism: Secondary | ICD-10-CM

## 2017-07-09 DIAGNOSIS — E871 Hypo-osmolality and hyponatremia: Secondary | ICD-10-CM

## 2017-07-09 DIAGNOSIS — F329 Major depressive disorder, single episode, unspecified: Secondary | ICD-10-CM | POA: Diagnosis present

## 2017-07-09 DIAGNOSIS — D6481 Anemia due to antineoplastic chemotherapy: Secondary | ICD-10-CM | POA: Diagnosis present

## 2017-07-09 DIAGNOSIS — D6959 Other secondary thrombocytopenia: Secondary | ICD-10-CM | POA: Diagnosis present

## 2017-07-09 DIAGNOSIS — F32A Depression, unspecified: Secondary | ICD-10-CM | POA: Diagnosis present

## 2017-07-09 DIAGNOSIS — K859 Acute pancreatitis without necrosis or infection, unspecified: Secondary | ICD-10-CM | POA: Diagnosis present

## 2017-07-09 DIAGNOSIS — K651 Peritoneal abscess: Secondary | ICD-10-CM

## 2017-07-09 DIAGNOSIS — R6510 Systemic inflammatory response syndrome (SIRS) of non-infectious origin without acute organ dysfunction: Secondary | ICD-10-CM | POA: Diagnosis present

## 2017-07-09 DIAGNOSIS — C185 Malignant neoplasm of splenic flexure: Secondary | ICD-10-CM | POA: Diagnosis present

## 2017-07-09 DIAGNOSIS — D630 Anemia in neoplastic disease: Secondary | ICD-10-CM | POA: Diagnosis present

## 2017-07-09 DIAGNOSIS — T451X5A Adverse effect of antineoplastic and immunosuppressive drugs, initial encounter: Secondary | ICD-10-CM | POA: Diagnosis present

## 2017-07-09 DIAGNOSIS — C7889 Secondary malignant neoplasm of other digestive organs: Secondary | ICD-10-CM

## 2017-07-09 HISTORY — DX: Acute embolism and thrombosis of unspecified deep veins of right lower extremity: I82.401

## 2017-07-09 LAB — COMPREHENSIVE METABOLIC PANEL
ALBUMIN: 2.9 g/dL — AB (ref 3.5–5.0)
ALK PHOS: 58 U/L (ref 38–126)
ALT: 12 U/L — AB (ref 17–63)
ANION GAP: 9 (ref 5–15)
AST: 28 U/L (ref 15–41)
BILIRUBIN TOTAL: 1.5 mg/dL — AB (ref 0.3–1.2)
BUN: 10 mg/dL (ref 6–20)
CALCIUM: 8.3 mg/dL — AB (ref 8.9–10.3)
CO2: 21 mmol/L — ABNORMAL LOW (ref 22–32)
CREATININE: 0.97 mg/dL (ref 0.61–1.24)
Chloride: 98 mmol/L — ABNORMAL LOW (ref 101–111)
GFR calc Af Amer: >60 mL/min (ref >60)
GFR calc non Af Amer: >60 mL/min (ref >60)
Glucose, Bld: 114 mg/dL — ABNORMAL HIGH (ref 65–99)
Potassium: 3.6 mmol/L (ref 3.5–5.1)
Sodium: 128 mmol/L — ABNORMAL LOW (ref 135–145)
TOTAL PROTEIN: 7.9 g/dL (ref 6.5–8.1)

## 2017-07-09 LAB — CBC WITH DIFFERENTIAL/PLATELET
Basophils Absolute: 0 10*3/uL (ref 0.0–0.1)
Basophils Relative: 0 %
Eosinophils Absolute: 0 10*3/uL (ref 0.0–0.7)
Eosinophils Relative: 0 %
HEMATOCRIT: 34.9 % — AB (ref 39.0–52.0)
HEMOGLOBIN: 12.3 g/dL — AB (ref 13.0–17.0)
LYMPHS ABS: 1.5 10*3/uL (ref 0.7–4.0)
Lymphocytes Relative: 10 %
MCH: 29.5 pg (ref 26.0–34.0)
MCHC: 35.2 g/dL (ref 30.0–36.0)
MCV: 83.7 fL (ref 78.0–100.0)
MONOS PCT: 7 %
Monocytes Absolute: 1.1 10*3/uL — ABNORMAL HIGH (ref 0.1–1.0)
NEUTROS ABS: 12.1 10*3/uL — AB (ref 1.7–7.7)
NEUTROS PCT: 82 %
Platelets: 118 10*3/uL — ABNORMAL LOW (ref 150–400)
RBC: 4.17 MIL/uL — ABNORMAL LOW (ref 4.22–5.81)
RDW: 14.7 % (ref 11.5–15.5)
WBC: 14.8 10*3/uL — ABNORMAL HIGH (ref 4.0–10.5)

## 2017-07-09 MED ORDER — SODIUM CHLORIDE 0.9 % IV BOLUS (SEPSIS)
2000.0000 mL | Freq: Once | INTRAVENOUS | Status: AC
  Administered 2017-07-09: 2000 mL via INTRAVENOUS

## 2017-07-09 MED ORDER — SODIUM CHLORIDE 0.9 % IV SOLN
INTRAVENOUS | Status: DC
  Administered 2017-07-09: 23:00:00 via INTRAVENOUS
  Administered 2017-07-10: 1000 mL via INTRAVENOUS
  Administered 2017-07-10: 03:00:00 via INTRAVENOUS
  Administered 2017-07-10: 1000 mL via INTRAVENOUS
  Administered 2017-07-11 (×2): via INTRAVENOUS
  Administered 2017-07-11: 1000 mL via INTRAVENOUS
  Administered 2017-07-12 – 2017-07-13 (×2): via INTRAVENOUS

## 2017-07-09 NOTE — ED Triage Notes (Signed)
Pt c/o left sided abdominal pain, weakness and diarrhea for the last several days.  Per pt last chemo on 7/12.  Pt rates pain 10/10, aching in nature.  Pt is hypotensive in triage.
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
## 2017-07-09 NOTE — ED Notes (Signed)
ED Provider at bedside. 

## 2017-07-10 ENCOUNTER — Inpatient Hospital Stay (HOSPITAL_COMMUNITY): Payer: Medicaid Other

## 2017-07-10 ENCOUNTER — Encounter (HOSPITAL_COMMUNITY): Payer: Self-pay

## 2017-07-10 DIAGNOSIS — C786 Secondary malignant neoplasm of retroperitoneum and peritoneum: Secondary | ICD-10-CM | POA: Diagnosis present

## 2017-07-10 DIAGNOSIS — E43 Unspecified severe protein-calorie malnutrition: Secondary | ICD-10-CM | POA: Diagnosis present

## 2017-07-10 DIAGNOSIS — R6510 Systemic inflammatory response syndrome (SIRS) of non-infectious origin without acute organ dysfunction: Secondary | ICD-10-CM | POA: Diagnosis present

## 2017-07-10 DIAGNOSIS — C186 Malignant neoplasm of descending colon: Secondary | ICD-10-CM | POA: Diagnosis present

## 2017-07-10 DIAGNOSIS — Z7901 Long term (current) use of anticoagulants: Secondary | ICD-10-CM

## 2017-07-10 DIAGNOSIS — E876 Hypokalemia: Secondary | ICD-10-CM | POA: Diagnosis not present

## 2017-07-10 DIAGNOSIS — Z86718 Personal history of other venous thrombosis and embolism: Secondary | ICD-10-CM | POA: Diagnosis not present

## 2017-07-10 DIAGNOSIS — A09 Infectious gastroenteritis and colitis, unspecified: Secondary | ICD-10-CM | POA: Diagnosis not present

## 2017-07-10 DIAGNOSIS — R1084 Generalized abdominal pain: Secondary | ICD-10-CM | POA: Diagnosis not present

## 2017-07-10 DIAGNOSIS — D6959 Other secondary thrombocytopenia: Secondary | ICD-10-CM | POA: Diagnosis present

## 2017-07-10 DIAGNOSIS — C185 Malignant neoplasm of splenic flexure: Secondary | ICD-10-CM | POA: Diagnosis present

## 2017-07-10 DIAGNOSIS — F32A Depression, unspecified: Secondary | ICD-10-CM | POA: Diagnosis present

## 2017-07-10 DIAGNOSIS — R197 Diarrhea, unspecified: Secondary | ICD-10-CM | POA: Diagnosis present

## 2017-07-10 DIAGNOSIS — C182 Malignant neoplasm of ascending colon: Secondary | ICD-10-CM | POA: Diagnosis present

## 2017-07-10 DIAGNOSIS — I959 Hypotension, unspecified: Secondary | ICD-10-CM | POA: Diagnosis present

## 2017-07-10 DIAGNOSIS — R109 Unspecified abdominal pain: Secondary | ICD-10-CM | POA: Diagnosis present

## 2017-07-10 DIAGNOSIS — K859 Acute pancreatitis without necrosis or infection, unspecified: Secondary | ICD-10-CM | POA: Diagnosis present

## 2017-07-10 DIAGNOSIS — I82401 Acute embolism and thrombosis of unspecified deep veins of right lower extremity: Secondary | ICD-10-CM | POA: Diagnosis not present

## 2017-07-10 DIAGNOSIS — E871 Hypo-osmolality and hyponatremia: Secondary | ICD-10-CM | POA: Diagnosis present

## 2017-07-10 DIAGNOSIS — A419 Sepsis, unspecified organism: Secondary | ICD-10-CM | POA: Diagnosis present

## 2017-07-10 DIAGNOSIS — Z809 Family history of malignant neoplasm, unspecified: Secondary | ICD-10-CM | POA: Diagnosis not present

## 2017-07-10 DIAGNOSIS — T451X5A Adverse effect of antineoplastic and immunosuppressive drugs, initial encounter: Secondary | ICD-10-CM | POA: Diagnosis present

## 2017-07-10 DIAGNOSIS — K651 Peritoneal abscess: Secondary | ICD-10-CM | POA: Diagnosis present

## 2017-07-10 DIAGNOSIS — I82411 Acute embolism and thrombosis of right femoral vein: Secondary | ICD-10-CM | POA: Diagnosis not present

## 2017-07-10 DIAGNOSIS — D6481 Anemia due to antineoplastic chemotherapy: Secondary | ICD-10-CM | POA: Diagnosis present

## 2017-07-10 DIAGNOSIS — Z681 Body mass index (BMI) 19 or less, adult: Secondary | ICD-10-CM | POA: Diagnosis not present

## 2017-07-10 DIAGNOSIS — Z79899 Other long term (current) drug therapy: Secondary | ICD-10-CM

## 2017-07-10 DIAGNOSIS — K8689 Other specified diseases of pancreas: Secondary | ICD-10-CM | POA: Diagnosis not present

## 2017-07-10 DIAGNOSIS — C7889 Secondary malignant neoplasm of other digestive organs: Secondary | ICD-10-CM

## 2017-07-10 DIAGNOSIS — K529 Noninfective gastroenteritis and colitis, unspecified: Secondary | ICD-10-CM | POA: Diagnosis present

## 2017-07-10 DIAGNOSIS — D630 Anemia in neoplastic disease: Secondary | ICD-10-CM | POA: Diagnosis present

## 2017-07-10 DIAGNOSIS — E440 Moderate protein-calorie malnutrition: Secondary | ICD-10-CM | POA: Diagnosis not present

## 2017-07-10 DIAGNOSIS — F329 Major depressive disorder, single episode, unspecified: Secondary | ICD-10-CM

## 2017-07-10 DIAGNOSIS — C189 Malignant neoplasm of colon, unspecified: Secondary | ICD-10-CM | POA: Diagnosis not present

## 2017-07-10 LAB — URINALYSIS, ROUTINE W REFLEX MICROSCOPIC
BILIRUBIN URINE: NEGATIVE
Bacteria, UA: NONE SEEN
Glucose, UA: NEGATIVE mg/dL
Ketones, ur: NEGATIVE mg/dL
LEUKOCYTES UA: NEGATIVE
NITRITE: NEGATIVE
PH: 5 (ref 5.0–8.0)
Protein, ur: NEGATIVE mg/dL
SPECIFIC GRAVITY, URINE: 1.009 (ref 1.005–1.030)

## 2017-07-10 LAB — GLUCOSE, CAPILLARY: GLUCOSE-CAPILLARY: 94 mg/dL (ref 65–99)

## 2017-07-10 LAB — CBC
HEMATOCRIT: 32.5 % — AB (ref 39.0–52.0)
HEMOGLOBIN: 11 g/dL — AB (ref 13.0–17.0)
MCH: 28.3 pg (ref 26.0–34.0)
MCHC: 33.8 g/dL (ref 30.0–36.0)
MCV: 83.5 fL (ref 78.0–100.0)
Platelets: 99 10*3/uL — ABNORMAL LOW (ref 150–400)
RBC: 3.89 MIL/uL — AB (ref 4.22–5.81)
RDW: 14.8 % (ref 11.5–15.5)
WBC: 11.1 10*3/uL — AB (ref 4.0–10.5)

## 2017-07-10 LAB — MAGNESIUM: Magnesium: 1.3 mg/dL — ABNORMAL LOW (ref 1.7–2.4)

## 2017-07-10 LAB — COMPREHENSIVE METABOLIC PANEL
ALT: 9 U/L — ABNORMAL LOW (ref 17–63)
ANION GAP: 8 (ref 5–15)
AST: 18 U/L (ref 15–41)
Albumin: 2.5 g/dL — ABNORMAL LOW (ref 3.5–5.0)
Alkaline Phosphatase: 51 U/L (ref 38–126)
BUN: 9 mg/dL (ref 6–20)
CHLORIDE: 103 mmol/L (ref 101–111)
CO2: 21 mmol/L — ABNORMAL LOW (ref 22–32)
Calcium: 8 mg/dL — ABNORMAL LOW (ref 8.9–10.3)
Creatinine, Ser: 0.83 mg/dL (ref 0.61–1.24)
Glucose, Bld: 112 mg/dL — ABNORMAL HIGH (ref 65–99)
POTASSIUM: 3.1 mmol/L — AB (ref 3.5–5.1)
Sodium: 132 mmol/L — ABNORMAL LOW (ref 135–145)
Total Bilirubin: 1 mg/dL (ref 0.3–1.2)
Total Protein: 6.9 g/dL (ref 6.5–8.1)

## 2017-07-10 LAB — PROCALCITONIN: PROCALCITONIN: 0.72 ng/mL

## 2017-07-10 LAB — MRSA PCR SCREENING: MRSA by PCR: NEGATIVE

## 2017-07-10 LAB — HIV ANTIBODY (ROUTINE TESTING W REFLEX): HIV Screen 4th Generation wRfx: NONREACTIVE

## 2017-07-10 LAB — LIPASE, BLOOD: LIPASE: 18 U/L (ref 11–51)

## 2017-07-10 LAB — LACTIC ACID, PLASMA
LACTIC ACID, VENOUS: 0.6 mmol/L (ref 0.5–1.9)
Lactic Acid, Venous: 0.7 mmol/L (ref 0.5–1.9)

## 2017-07-10 LAB — AMYLASE: AMYLASE: 46 U/L (ref 28–100)

## 2017-07-10 MED ORDER — MENTHOL 3 MG MT LOZG
1.0000 | LOZENGE | OROMUCOSAL | Status: DC | PRN
  Filled 2017-07-10: qty 9

## 2017-07-10 MED ORDER — METHOCARBAMOL 1000 MG/10ML IJ SOLN
1000.0000 mg | Freq: Four times a day (QID) | INTRAVENOUS | Status: DC | PRN
  Filled 2017-07-10: qty 10

## 2017-07-10 MED ORDER — GUAIFENESIN-DM 100-10 MG/5ML PO SYRP: 10.0000 mL | ORAL_SOLUTION | ORAL | Status: DC | PRN

## 2017-07-10 MED ORDER — SODIUM CHLORIDE 0.9 % IV BOLUS (SEPSIS)
500.0000 mL | Freq: Once | INTRAVENOUS | Status: AC
  Administered 2017-07-10: 500 mL via INTRAVENOUS

## 2017-07-10 MED ORDER — PHENOL 1.4 % MT LIQD: 1.0000 | OROMUCOSAL | Status: DC | PRN

## 2017-07-10 MED ORDER — METRONIDAZOLE IN NACL 5-0.79 MG/ML-% IV SOLN
500.0000 mg | Freq: Three times a day (TID) | INTRAVENOUS | Status: DC
  Administered 2017-07-10 (×2): 500 mg via INTRAVENOUS
  Filled 2017-07-10 (×3): qty 100

## 2017-07-10 MED ORDER — MORPHINE SULFATE (PF) 2 MG/ML IV SOLN
2.0000 mg | INTRAVENOUS | Status: DC | PRN
  Administered 2017-07-10 – 2017-07-11 (×2): 2 mg via INTRAVENOUS
  Filled 2017-07-10 (×2): qty 1

## 2017-07-10 MED ORDER — HYDROCORTISONE 1 % EX CREA
TOPICAL_CREAM | Freq: Two times a day (BID) | CUTANEOUS | Status: DC
  Administered 2017-07-10: 1 via TOPICAL
  Administered 2017-07-10 – 2017-07-14 (×4): via TOPICAL
  Filled 2017-07-10 (×2): qty 28

## 2017-07-10 MED ORDER — CIPROFLOXACIN IN D5W 400 MG/200ML IV SOLN
400.0000 mg | Freq: Two times a day (BID) | INTRAVENOUS | Status: DC
  Administered 2017-07-10 (×2): 400 mg via INTRAVENOUS
  Filled 2017-07-10 (×2): qty 200

## 2017-07-10 MED ORDER — ALUM & MAG HYDROXIDE-SIMETH 200-200-20 MG/5ML PO SUSP: 30.0000 mL | Freq: Four times a day (QID) | ORAL | Status: DC | PRN

## 2017-07-10 MED ORDER — FERROUS SULFATE 325 (65 FE) MG PO TABS: 325.0000 mg | ORAL_TABLET | Freq: Two times a day (BID) | ORAL | Status: DC

## 2017-07-10 MED ORDER — PROCHLORPERAZINE EDISYLATE 5 MG/ML IJ SOLN: 5.0000 mg | INTRAMUSCULAR | Status: DC | PRN

## 2017-07-10 MED ORDER — IOPAMIDOL (ISOVUE-300) INJECTION 61%
100.0000 mL | Freq: Once | INTRAVENOUS | Status: AC | PRN
  Administered 2017-07-10: 100 mL via INTRAVENOUS

## 2017-07-10 MED ORDER — MIRTAZAPINE 15 MG PO TABS
15.0000 mg | ORAL_TABLET | Freq: Every day | ORAL | Status: DC
  Administered 2017-07-10 – 2017-07-13 (×5): 15 mg via ORAL
  Filled 2017-07-10 (×5): qty 1

## 2017-07-10 MED ORDER — HYDROCORTISONE 2.5 % RE CREA
1.0000 "application " | TOPICAL_CREAM | Freq: Four times a day (QID) | RECTAL | Status: DC | PRN
  Administered 2017-07-12: 1 via TOPICAL
  Filled 2017-07-10: qty 28.35

## 2017-07-10 MED ORDER — OXYCODONE HCL 5 MG PO TABS: 10.0000 mg | ORAL_TABLET | Freq: Four times a day (QID) | ORAL | Status: DC | PRN

## 2017-07-10 MED ORDER — DIPHENHYDRAMINE HCL 50 MG/ML IJ SOLN: 12.5000 mg | Freq: Four times a day (QID) | INTRAMUSCULAR | Status: DC | PRN

## 2017-07-10 MED ORDER — LOPERAMIDE HCL 2 MG PO CAPS
2.0000 mg | ORAL_CAPSULE | Freq: Four times a day (QID) | ORAL | Status: DC | PRN
Start: 2017-07-10 — End: 2017-07-14

## 2017-07-10 MED ORDER — SODIUM CHLORIDE 0.9 % IV SOLN
INTRAVENOUS | Status: DC
  Administered 2017-07-10: via INTRAVENOUS

## 2017-07-10 MED ORDER — IOPAMIDOL (ISOVUE-300) INJECTION 61%
15.0000 mL | INTRAVENOUS | Status: AC
  Administered 2017-07-10 (×2): 15 mL via ORAL

## 2017-07-10 MED ORDER — CLINDAMYCIN PHOS-BENZOYL PEROX 1.2-2.5 % EX GEL: 1.0000 "application " | Freq: Two times a day (BID) | CUTANEOUS | Status: DC

## 2017-07-10 MED ORDER — MAGNESIUM SULFATE 2 GM/50ML IV SOLN
2.0000 g | Freq: Once | INTRAVENOUS | Status: AC
  Administered 2017-07-10: 2 g via INTRAVENOUS
  Filled 2017-07-10: qty 50

## 2017-07-10 MED ORDER — IOPAMIDOL (ISOVUE-300) INJECTION 61%
INTRAVENOUS | Status: AC
  Filled 2017-07-10: qty 30

## 2017-07-10 MED ORDER — SODIUM CHLORIDE 0.9% FLUSH
3.0000 mL | Freq: Two times a day (BID) | INTRAVENOUS | Status: DC
  Administered 2017-07-10 – 2017-07-12 (×5): 3 mL via INTRAVENOUS

## 2017-07-10 MED ORDER — ACETAMINOPHEN 650 MG RE SUPP: 650.0000 mg | Freq: Four times a day (QID) | RECTAL | Status: DC | PRN

## 2017-07-10 MED ORDER — HYDROCORTISONE 1 % EX CREA: 1.0000 "application " | TOPICAL_CREAM | Freq: Three times a day (TID) | CUTANEOUS | Status: DC | PRN

## 2017-07-10 MED ORDER — PIPERACILLIN-TAZOBACTAM 3.375 G IVPB
3.3750 g | Freq: Three times a day (TID) | INTRAVENOUS | Status: DC
  Administered 2017-07-10 – 2017-07-12 (×6): 3.375 g via INTRAVENOUS
  Filled 2017-07-10 (×6): qty 50

## 2017-07-10 MED ORDER — ACETAMINOPHEN 325 MG PO TABS: 650.0000 mg | ORAL_TABLET | Freq: Four times a day (QID) | ORAL | Status: DC | PRN

## 2017-07-10 MED ORDER — PANTOPRAZOLE SODIUM 40 MG IV SOLR
40.0000 mg | Freq: Two times a day (BID) | INTRAVENOUS | Status: DC
  Administered 2017-07-10 – 2017-07-13 (×6): 40 mg via INTRAVENOUS
  Filled 2017-07-10 (×6): qty 40

## 2017-07-10 MED ORDER — LACTATED RINGERS IV BOLUS (SEPSIS): 1000.0000 mL | Freq: Three times a day (TID) | INTRAVENOUS | Status: AC | PRN

## 2017-07-10 MED ORDER — ONDANSETRON HCL 4 MG/2ML IJ SOLN
4.0000 mg | Freq: Three times a day (TID) | INTRAMUSCULAR | Status: DC | PRN
  Administered 2017-07-10: 4 mg via INTRAVENOUS
  Filled 2017-07-10: qty 2

## 2017-07-10 MED ORDER — RIVAROXABAN 20 MG PO TABS: 20.0000 mg | ORAL_TABLET | Freq: Every day | ORAL | Status: DC

## 2017-07-10 MED ORDER — SACCHAROMYCES BOULARDII 250 MG PO CAPS
250.0000 mg | ORAL_CAPSULE | Freq: Two times a day (BID) | ORAL | Status: DC
  Administered 2017-07-11 – 2017-07-14 (×7): 250 mg via ORAL
  Filled 2017-07-10 (×8): qty 1

## 2017-07-10 MED ORDER — ZOLPIDEM TARTRATE 5 MG PO TABS
5.0000 mg | ORAL_TABLET | Freq: Every evening | ORAL | Status: DC | PRN
Start: 2017-07-10 — End: 2017-07-14

## 2017-07-10 MED ORDER — LIP MEDEX EX OINT
1.0000 "application " | TOPICAL_OINTMENT | Freq: Two times a day (BID) | CUTANEOUS | Status: DC
  Administered 2017-07-11 – 2017-07-14 (×6): 1 via TOPICAL
  Filled 2017-07-10 (×4): qty 7

## 2017-07-10 MED ORDER — MAGIC MOUTHWASH
15.0000 mL | Freq: Four times a day (QID) | ORAL | Status: DC | PRN
  Filled 2017-07-10: qty 15

## 2017-07-10 MED ORDER — METOCLOPRAMIDE HCL 5 MG/ML IJ SOLN: 5.0000 mg | Freq: Four times a day (QID) | INTRAMUSCULAR | Status: DC | PRN

## 2017-07-10 MED ORDER — ORAL CARE MOUTH RINSE
15.0000 mL | Freq: Two times a day (BID) | OROMUCOSAL | Status: DC
  Administered 2017-07-10 – 2017-07-14 (×9): 15 mL via OROMUCOSAL

## 2017-07-10 MED ORDER — POTASSIUM CHLORIDE 10 MEQ/100ML IV SOLN
10.0000 meq | INTRAVENOUS | Status: AC
  Administered 2017-07-10 (×4): 10 meq via INTRAVENOUS
  Filled 2017-07-10 (×4): qty 100

## 2017-07-10 MED ORDER — IOPAMIDOL (ISOVUE-300) INJECTION 61%
INTRAVENOUS | Status: AC
  Filled 2017-07-10: qty 100

## 2017-07-10 NOTE — Progress Notes (Signed)
Pharmacy Antibiotic Note  Jeff [name] is a 56 y.o. male with hx of colon ca c/o diarrhea admitted on 07/09/2017 with possible intra-abdominal infection.  Pharmacy has been consulted for Zosyn dosing.  Plan: Zosyn 3.375 gr IV q8h Extended interval infusion F/u scr/cultures   Will sign off at this time.  Please reconsult if a change in clinical status warrants re-evaluation of dosage.      Height: 5\' 9"  (175.3 cm) Weight: 126 lb 12.2 oz (57.5 kg) IBW/kg (Calculated) : 70.7  Temp (24hrs), Avg:99.7 F (37.6 C), Min:98.5 F (36.9 C), Max:100.8 F (38.2 C)   Recent Labs Lab 07/06/17 0823 07/09/17 2230 07/10/17 0321 07/10/17 0624  WBC 12.3* 14.8* 11.1*  --   CREATININE 0.8 0.97 0.83  --   LATICACIDVEN  --   --  0.7 0.6    Estimated Creatinine Clearance: 78.9 mL/min (by C-G formula based on SCr of 0.83 mg/dL).    No Known Allergies  Antimicrobials this admission: 7/14 cipro >> 7/14 7/14 flagyl >> 7/14 7/14 Zosyn >>  Dose adjustments this admission:   Microbiology results:  BCx:   UCx:    Sputum:    MRSA PCR:   Thank you for allowing pharmacy to be a part of this patient's care.   Royetta Asal, PharmD, BCPS Pager [phone] 07/10/2017 4:31 PM
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
## 2017-07-10 NOTE — Progress Notes (Signed)
Pharmacy Antibiotic Note  Jeff [name] is a 56 y.o. male with hx of colon ca c/o diarrhea admitted on 07/09/2017 with possible colitis.  Pharmacy has been consulted for cipro dosing.  Plan: Cipro 400 mg IV q12h Flagyl 500 mg IV q8h (MD) F/u scr/cultures     Temp (24hrs), Avg:99.2 F (37.3 C), Min:99.2 F (37.3 C), Max:99.2 F (37.3 C)   Recent Labs Lab 07/06/17 0823 07/09/17 2230  WBC 12.3* 14.8*  CREATININE 0.8 0.97    Estimated Creatinine Clearance: 70.2 mL/min (by C-G formula based on SCr of 0.97 mg/dL).    No Known Allergies  Antimicrobials this admission: 7/14 cipro >>  7/14 flagyl >>   Dose adjustments this admission:   Microbiology results:  BCx:   UCx:    Sputum:    MRSA PCR:   Thank you for allowing pharmacy to be a part of this patient's care.  Dorrene German 07/10/2017 12:45 AM

## 2017-07-10 NOTE — Progress Notes (Signed)
PROGRESS NOTE [facility]   Jeff [name]   KCL:[id number] [name]: 10/16/1961  [name]: 07/09/2017 PCP: [name], MD   Brief Narrative:  56 year old male with medical history significant for colon cancer on chemotherapy, DVT on Xarelto, anemia, depression presented with abdominal pain and diarrhea for 3 days prior to admission. Patient reported symptoms started after chemotherapy which was done 07/08/17. Patient was admitted for possible colitis and was started on Cipro and Flagyl. Despite patient being nothing by mouth and being treated with antibiotic patient continued with abdominal pain, a CT of the abdomen was done which showed a new abscess measuring 3.8 x 4.6 cm inferior to the pancreatic tail and spleen. There is also some inflammatory changes around the pancreas may be suggesting pancreatitis. Surgical team and medical oncology has been consulted.  Subjective: Patient seen and examined, patient continue to abdominal pain, although responding to pain medication. Patient denies nausea and vomiting. Diarrhea has resolved.  Assessment & Plan: Abdominal pain/Sepsis - secondary to ? Pancreatic abscess with pancreatitis, ? Colitis  Patient met sepsis criteria on initial presentation with fever leukocytosis and tachycardia Initially with mild hypotension which responded well to IV fluid resuscitation. Patient initially treated for colitis, was started on Cipro and Flagyl - will switch to Zosyn  Obtained CT of the abdomen see results below General surgery consulted - they follow patient for possible percutaneous drainage after reviewing images Keep patient nothing by mouth Obtain lipase and amylase Continue IV fluids Follow-up cultures Pro-calcitonin will not be helpful in this patient as patient is currently receiving active chemotherapy. Supportive treatment pain medication as needed, Zofran for nausea.  Adenocarcinoma of the ascending colon Currently on chemotherapy, last dose  07/08/2017. Medical oncology has been informed of admission they will follow  DVT of right lower extremity Patient on Xarelto, will hold in view of possible procedure.  Hyponatremia Likely secondary to GI losses Improving current sodium 132 Continue IV fluid  DVT prophylaxis: SCDs, Xarelto hold Code Status: Full code Family Communication: None at bedside Disposition Plan: Home when medically stable  Consultants:   Genitourinary  Procedures:   None  Antimicrobials: Anti-infectives    Start     Dose/Rate Route Frequency Ordered Stop   07/10/17 0045  metroNIDAZOLE (FLAGYL) IVPB 500 mg  Status:  Discontinued     500 mg 100 mL/hr over 60 Minutes Intravenous Every 8 hours 07/10/17 0039 07/10/17 1607   07/10/17 0045  ciprofloxacin (CIPRO) IVPB 400 mg  Status:  Discontinued     400 mg 200 mL/hr over 60 Minutes Intravenous Every 12 hours 07/10/17 0042 07/10/17 1607       Objective: Vitals:   07/10/17 1200 07/10/17 1300 07/10/17 1400 07/10/17 1410  BP: (!) 138/92 96/60 (!) 150/92   Pulse: 84   69  Resp: (!) 23 17 (!) 21 20  Temp: 98.5 F (36.9 C)     TempSrc: Oral     SpO2: 95%   95%  Weight:      Height:        Intake/Output Summary (Last 24 hours) at 07/10/17 1547 Last data filed at 07/10/17 1400  Gross per 24 hour  Intake          4083.33 ml  Output             1650 ml  Net          2433.33 ml   Filed Weights   07/10/17 0200  Weight: 57.5 kg (126 lb 12.2 oz)  Examination:  General exam: Appears calm and comfortable  HEENT: AC/AT, PERRLA, OP moist and clear Respiratory system: Clear to auscultation. No wheezes,crackle or rhonchi Cardiovascular system: S1 & S2 heard, RRR. No JVD, murmurs, rubs or gallops Gastrointestinal system: Soft, nondistended, mild tenderness epigastric and left upper quadrant, positive bowel sounds, negative rebound or guarding. Central nervous system: Alert and oriented. No focal neurological deficits. Extremities: No pedal  edema.   Skin: No rashes, lesions or ulcers Psychiatry: Judgement and insight appear normal. Mood & affect flat.  Data Reviewed: I have personally reviewed following labs and imaging studies  CBC:  Recent Labs Lab 07/06/17 0823 07/09/17 2230 07/10/17 0321  WBC 12.3* 14.8* 11.1*  NEUTROABS 9.3* 12.1*  --   HGB 12.8* 12.3* 11.0*  HCT 39.2 34.9* 32.5*  MCV 88.2 83.7 83.5  PLT 84* 118* 99*   Basic Metabolic Panel:  Recent Labs Lab 07/06/17 0823 07/09/17 2230 07/10/17 0321  NA 134* 128* 132*  K 3.6 3.6 3.1*  CL  --  98* 103  CO2 23 21* 21*  GLUCOSE 102 114* 112*  BUN 5.1* 10 9  CREATININE 0.8 0.97 0.83  CALCIUM 9.3 8.3* 8.0*  MG 1.3*  --   --    GFR: Estimated Creatinine Clearance: 78.9 mL/min (by C-G formula based on SCr of 0.83 mg/dL). Liver Function Tests:  Recent Labs Lab 07/06/17 0823 07/09/17 2230 07/10/17 0321  AST 22 28 18   ALT 7 12* 9*  ALKPHOS 100 58 51  BILITOT 0.59 1.5* 1.0  PROT 8.0 7.9 6.9  ALBUMIN 2.9* 2.9* 2.5*   No results for input(s): LIPASE, AMYLASE in the last 168 hours. No results for input(s): AMMONIA in the last 168 hours. Coagulation Profile: No results for input(s): INR, PROTIME in the last 168 hours. Cardiac Enzymes: No results for input(s): CKTOTAL, CKMB, CKMBINDEX, TROPONINI in the last 168 hours. BNP (last 3 results) No results for input(s): PROBNP in the last 8760 hours. HbA1C: No results for input(s): HGBA1C in the last 72 hours. CBG:  Recent Labs Lab 07/10/17 0843  GLUCAP 94   Lipid Profile: No results for input(s): CHOL, HDL, LDLCALC, TRIG, CHOLHDL, LDLDIRECT in the last 72 hours. Thyroid Function Tests: No results for input(s): TSH, T4TOTAL, FREET4, T3FREE, THYROIDAB in the last 72 hours. Anemia Panel: No results for input(s): VITAMINB12, FOLATE, FERRITIN, TIBC, IRON, RETICCTPCT in the last 72 hours. Sepsis Labs:  Recent Labs Lab 07/10/17 0321 07/10/17 0624  PROCALCITON 0.72  --   LATICACIDVEN 0.7 0.6     Recent Results (from the past 240 hour(s))  MRSA PCR Screening     Status: None   Collection Time: 07/10/17  2:13 AM  Result Value Ref Range Status   MRSA by PCR NEGATIVE NEGATIVE Final    Comment:        The GeneXpert MRSA Assay (FDA approved for NASAL specimens only), is one component of a comprehensive MRSA colonization surveillance program. It is not intended to diagnose MRSA infection nor to guide or monitor treatment for MRSA infections.      Radiology Studies: Ct Abdomen Pelvis W Contrast  Result Date: 07/10/2017 CLINICAL DATA:  Colitis EXAM: CT ABDOMEN AND PELVIS WITH CONTRAST TECHNIQUE: Multidetector CT imaging of the abdomen and pelvis was performed using the standard protocol following bolus administration of intravenous contrast. CONTRAST:  171m ISOVUE-300 IOPAMIDOL (ISOVUE-300) INJECTION 61% COMPARISON:  07/05/2017 FINDINGS: Lower chest: Bibasilar dependent atelectasis versus airspace disease. Small pleural effusions. Hepatobiliary: Diffuse hepatic steatosis. There are varices anterior  to the liver. Gallbladder is unremarkable. Pancreas: The mass invading the tail of the pancreas and hilum of the spleen measures 5.8 x 3.8 cm previously measured 5.7 x 4.0 cm. There is fluid density surrounding the body and tail of the pancreas suggesting an inflammatory process of the pancreatic parenchyma. There is no pancreatic hemorrhage or necrosis. Spleen: The mass extending from the tail of the pancreas invades the spleen. There are scattered areas of low density within the spleen that have increased which may represent worsening tumor infiltration. Spleen laceration can have a similar appearance but there is no obvious intraperitoneal hemorrhage. Adrenals/Urinary Tract: Kidneys are unremarkable. Adrenal glands are unremarkable. Bladder is unremarkable. Stomach/Bowel: Wall thickening of the distal transverse colon has improved. There is persistent wall thickening of the colon at the  splenic flexure and descending colon. The wall thickening is more low density suggesting an inflammatory process. There is a mass medial to the descending colon on image 33 which measures 2.1 x 2.6 cm and previously measured 1.9 x 2.5 cm. It is not significantly changed. There is a mass adjacent to the greater curvature of the stomach on image 24 measuring 2.2 x 2.4 cm and previously measuring 1.9 x 1.9 cm. It is larger. There is now all large fluid and gas containing collection just inferior to the mass invading the pancreatic tail and splenic flexure worrisome for abscess. It measures 3.8 x 4.6 cm on image 24. The mass is surrounded by bowel loops and spleen. Percutaneous drainage would be difficult. Vascular/Lymphatic: No abnormal retroperitoneal adenopathy. No evidence of aortic aneurysm. Atherosclerotic calcifications of the aorta are noted. Reproductive: Prostate is upper normal in size. Other: No abdominal wall hernia. Musculoskeletal: Degenerative disc disease in the lumbar spine. No vertebral compression. IMPRESSION: The salient finding is a new abscess measuring 3.8 x 4.6 cm inferior to the pancreatic tail and spleen. Findings related to a mass in the pancreatic tail and spleen have likely progressed. Linear areas in the spleen may represent tumor infiltration. Spleen laceration can have a similar appearance, however there is no intraperitoneal hemorrhage to support this diagnosis. Bibasilar atelectasis versus airspace disease. Small pleural effusions. 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 Possible inflammatory change of the pancreas without evidence of necrosis or hemorrhage. Correlation with lipase is recommended. Wall thickening of the colon has improved, but it is of lower density today suggesting inflammation. Ischemia and tumor infiltration can have a similar appearance. There is no pneumatosis. The mass is adjacent to the descending colon and greater curvature of the stomach are described above. They have  slightly worsened. Electronically Signed   By: Marybelle Killings M.D.   On: 07/10/2017 13:46      Scheduled Meds: . Clindamycin Phos-Benzoyl Perox  1 application Topical BID  . ferrous sulfate  325 mg Oral BID WC  . hydrocortisone cream   Topical BID  . iopamidol      . iopamidol      . mouth rinse  15 mL Mouth Rinse BID  . mirtazapine  15 mg Oral QHS  . rivaroxaban  20 mg Oral Q supper  . sodium chloride flush  3 mL Intravenous Q12H   Continuous Infusions: . sodium chloride 1,000 mL (07/10/17 0814)  . ciprofloxacin Stopped (07/10/17 1133)  . metronidazole Stopped (07/10/17 1232)     LOS: 0 days   Time spent: Total of 35 minutes spent with pt, greater than 50% of which was spent in discussion of  treatment, counseling and coordination of care  Chipper Oman, MD Pager: Text Page via [email or web address]  [phone]  If 7PM-7AM, please contact night-coverage [email or web address] Password [facility] 07/10/2017, 3:47 PM

## 2017-07-11 ENCOUNTER — Inpatient Hospital Stay (HOSPITAL_COMMUNITY): Payer: Medicaid Other

## 2017-07-11 ENCOUNTER — Encounter (HOSPITAL_COMMUNITY): Payer: Self-pay | Admitting: Physician Assistant

## 2017-07-11 DIAGNOSIS — I82411 Acute embolism and thrombosis of right femoral vein: Secondary | ICD-10-CM

## 2017-07-11 DIAGNOSIS — K859 Acute pancreatitis without necrosis or infection, unspecified: Secondary | ICD-10-CM

## 2017-07-11 DIAGNOSIS — D6481 Anemia due to antineoplastic chemotherapy: Secondary | ICD-10-CM

## 2017-07-11 DIAGNOSIS — D6959 Other secondary thrombocytopenia: Secondary | ICD-10-CM

## 2017-07-11 DIAGNOSIS — C189 Malignant neoplasm of colon, unspecified: Secondary | ICD-10-CM

## 2017-07-11 DIAGNOSIS — C186 Malignant neoplasm of descending colon: Secondary | ICD-10-CM

## 2017-07-11 DIAGNOSIS — E876 Hypokalemia: Secondary | ICD-10-CM

## 2017-07-11 LAB — CBC WITH DIFFERENTIAL/PLATELET
BASOS ABS: 0 10*3/uL (ref 0.0–0.1)
Basophils Relative: 1 %
EOS ABS: 0.1 10*3/uL (ref 0.0–0.7)
Eosinophils Relative: 1 %
HEMATOCRIT: 31.6 % — AB (ref 39.0–52.0)
HEMOGLOBIN: 10.7 g/dL — AB (ref 13.0–17.0)
Lymphocytes Relative: 16 %
Lymphs Abs: 1.3 10*3/uL (ref 0.7–4.0)
MCH: 29.2 pg (ref 26.0–34.0)
MCHC: 33.9 g/dL (ref 30.0–36.0)
MCV: 86.1 fL (ref 78.0–100.0)
MONOS PCT: 12 %
Monocytes Absolute: 0.9 10*3/uL (ref 0.1–1.0)
NEUTROS ABS: 5.8 10*3/uL (ref 1.7–7.7)
NEUTROS PCT: 72 %
Platelets: 129 10*3/uL — ABNORMAL LOW (ref 150–400)
RBC: 3.67 MIL/uL — AB (ref 4.22–5.81)
RDW: 14.8 % (ref 11.5–15.5)
WBC: 8.1 10*3/uL (ref 4.0–10.5)

## 2017-07-11 LAB — COMPREHENSIVE METABOLIC PANEL
ALBUMIN: 2.2 g/dL — AB (ref 3.5–5.0)
ALK PHOS: 42 U/L (ref 38–126)
ALT: 10 U/L — AB (ref 17–63)
AST: 16 U/L (ref 15–41)
Anion gap: 5 (ref 5–15)
BUN: 7 mg/dL (ref 6–20)
CALCIUM: 8 mg/dL — AB (ref 8.9–10.3)
CHLORIDE: 109 mmol/L (ref 101–111)
CO2: 20 mmol/L — AB (ref 22–32)
Creatinine, Ser: 0.76 mg/dL (ref 0.61–1.24)
GFR calc non Af Amer: >60 mL/min (ref >60)
GLUCOSE: 89 mg/dL (ref 65–99)
Potassium: 3.8 mmol/L (ref 3.5–5.1)
SODIUM: 134 mmol/L — AB (ref 135–145)
Total Bilirubin: 1 mg/dL (ref 0.3–1.2)
Total Protein: 6.4 g/dL — ABNORMAL LOW (ref 6.5–8.1)

## 2017-07-11 LAB — LIPASE, BLOOD: Lipase: 13 U/L (ref 11–51)

## 2017-07-11 LAB — GLUCOSE, CAPILLARY: Glucose-Capillary: 80 mg/dL (ref 65–99)

## 2017-07-11 LAB — PROTIME-INR
INR: 1.27
PROTHROMBIN TIME: 16 s — AB (ref 11.4–15.2)

## 2017-07-11 LAB — C DIFFICILE QUICK SCREEN W PCR REFLEX
C DIFFICLE (CDIFF) ANTIGEN: NEGATIVE
C Diff interpretation: NOT DETECTED
C Diff toxin: NEGATIVE

## 2017-07-11 LAB — MAGNESIUM: MAGNESIUM: 1.6 mg/dL — AB (ref 1.7–2.4)

## 2017-07-11 MED ORDER — LIDOCAINE HCL (CARDIAC) 20 MG/ML IV SOLN
INTRAVENOUS | Status: AC | PRN
  Administered 2017-07-11: 100 mg via INTRAVENOUS

## 2017-07-11 MED ORDER — MIDAZOLAM HCL 2 MG/2ML IJ SOLN
INTRAMUSCULAR | Status: AC | PRN
  Administered 2017-07-11: 1 mg via INTRAVENOUS

## 2017-07-11 MED ORDER — FENTANYL CITRATE (PF) 100 MCG/2ML IJ SOLN
INTRAMUSCULAR | Status: AC
  Filled 2017-07-11: qty 2

## 2017-07-11 MED ORDER — FENTANYL CITRATE (PF) 100 MCG/2ML IJ SOLN
INTRAMUSCULAR | Status: AC | PRN
  Administered 2017-07-11 (×2): 25 ug via INTRAVENOUS

## 2017-07-11 MED ORDER — IOPAMIDOL (ISOVUE-300) INJECTION 61%
INTRAVENOUS | Status: AC
  Administered 2017-07-11: 11:00:00
  Filled 2017-07-11: qty 75

## 2017-07-11 MED ORDER — DIPHENHYDRAMINE HCL 50 MG/ML IJ SOLN: 12.5000 mg | Freq: Four times a day (QID) | INTRAMUSCULAR | Status: DC | PRN

## 2017-07-11 MED ORDER — MIDAZOLAM HCL 2 MG/2ML IJ SOLN
INTRAMUSCULAR | Status: AC
  Filled 2017-07-11: qty 2

## 2017-07-11 MED ORDER — MAGNESIUM SULFATE 4 GM/100ML IV SOLN
4.0000 g | Freq: Once | INTRAVENOUS | Status: AC
  Administered 2017-07-11: 4 g via INTRAVENOUS
  Filled 2017-07-11: qty 100

## 2017-07-11 MED ORDER — SODIUM CHLORIDE 0.9% FLUSH
5.0000 mL | Freq: Three times a day (TID) | INTRAVENOUS | Status: DC
  Administered 2017-07-12 – 2017-07-14 (×2): 5 mL via INTRAVENOUS

## 2017-07-11 MED ORDER — IOPAMIDOL (ISOVUE-300) INJECTION 61%
100.0000 mL | Freq: Once | INTRAVENOUS | Status: AC | PRN
  Administered 2017-07-11: 75 mL via INTRAVENOUS

## 2017-07-11 MED ORDER — MAGNESIUM SULFATE 2 GM/50ML IV SOLN: 2.0000 g | Freq: Once | INTRAVENOUS | Status: DC

## 2017-07-11 NOTE — Progress Notes (Signed)
Initial Nutrition Assessment  DOCUMENTATION CODES:   Underweight  INTERVENTION:   Diet advancement per MD Will address nutritional needs once diet advanced.  NUTRITION DIAGNOSIS:   Increased nutrient needs related to cancer and cancer related treatments as evidenced by estimated needs.  GOAL:   Patient will meet greater than or equal to 90% of their needs  MONITOR:   Diet advancement, Labs, Weight trends, I & O's  REASON FOR ASSESSMENT:   Consult Assessment of nutrition requirement/status  ASSESSMENT:   56 year old male with medical history significant for colon cancer on chemotherapy, DVT on Xarelto, anemia, depression presented with abdominal pain and diarrhea for 3 days prior to admission. Patient reported symptoms started after chemotherapy which was done 07/08/17. Patient was admitted for possible colitis and was started on Cipro and Flagyl. Despite patient being nothing by mouth and being treated with antibiotic patient continued with abdominal pain, a CT of the abdomen was done which showed a new abscess measuring 3.8 x 4.6 cm inferior to the pancreatic tail and spleen. There is also some inflammatory changes around the pancreas may be suggesting pancreatitis. Surgical team and medical oncology has been consulted.  Patient having CT drain placement during time of visit. Pt NPO for procedure. Per chart review, pt was c/o diarrhea for 3 days PTA. Pt was last seen by outpatient RD on 5/15 and was gaining weight and eating well. Pt has since developed an intra-abdominal abscess.  Per chart review, pt has lost 21 lb since 4/27 (14% wt loss x 3 months, significant for time frame). Unable to perform NFPE at this time. Suspect to find some degree of malnutrition as pt with history of severe malnutrition diagnosis in February 2018.  Medications: Remeron tablet daily, IV Protonix every 12 hours, Florastor capsule BID  Labs reviewed: Low Na, Mg  Diet Order:  Diet NPO time specified  Except for: [employer], Sips with Meds  Skin:  Reviewed, no issues  Last BM:  7/15  Height:   Ht Readings from Last 1 Encounters:  07/10/17 5\' 9"  (1.753 m)    Weight:   Wt Readings from Last 1 Encounters:  07/10/17 126 lb 12.2 oz (57.5 kg)    Ideal Body Weight:  72.7 kg  BMI:  Body mass index is 18.72 kg/m.  Estimated Nutritional Needs:   Kcal:  3810-1751  Protein:  90-100g  Fluid:  1.9L/day  EDUCATION NEEDS:   No education needs identified at this time  Clayton Bibles, MS, RD, LDN Pager: [phone] After Hours Pager: [phone]

## 2017-07-11 NOTE — Procedures (Signed)
Pancreatic tail abscess  S/P CT DRAIN  No comp Stable cx sent 15cc bloody pus aspirated Full report in PACS

## 2017-07-11 NOTE — Progress Notes (Addendum)
Jeff [name]  [address] [place], [place] [address] Phone: [phone]  FAX: [place] [phone] 10/30/1961  CARE TEAM:  PCP: [name], MD  Outpatient Care Team: Patient Care Team: [name], MD as PCP - General (Family Medicine) [name], MD as Consulting Physician (Hematology and Oncology) [name], MD as Consulting Physician (Gastroenterology)  Inpatient Treatment Team: Treatment Team: Attending Provider: Patrecia Pour, Christean Grief, MD; Rounding Team: Joycelyn Das, MD; Consulting Physician: Nicholas Lose, MD; Consulting Physician: Edison Pace, Md, MD; Technician: Crecencio Mc, NT; Registered Nurse: Lenice Pressman, RN   Problem List:   Principal Problem:   Diarrhea Active Problems:   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  SQ Lovenox Tx for chronic DVT per TRH  -perc drainage of collection when > 48hr off Xerelto by IR.  If they feel that there is now window, f/u CT in 5 days to see if better controlled  -try to hold off on any surgery with poor nutrition & chemoTx just 2 days ago.  Maybe can only do proximal ostomy divarication given a giant tumor and invasion into multiple locations.  The patient is stable.  There is no evidence of peritonitis, acute abdomen, nor shock.  There is no  strong evidence of failure of improvement nor decline with current non-operative management.  There is no need for surgery at the present moment.  We will continue to follow.   -Tx low K  -recheck Mag -get Med Onc to see & comment -VTE prophylaxis- SCDs, etc -mobilize as tolerated to help recovery  20 minutes spent in review, evaluation, examination, counseling, and coordination of care.  More than 50% of that time was spent in counseling.  Adin Hector, M.D., F.A.C.S. Gastrointestinal and Minimally Invasive Surgery [facility] [address] [place], [place] [address] [phone] Main / Paging   07/11/2017    Subjective: (Chief complaint)  Sleeping better Had BMs Pain less  Objective:  Vital signs:  Vitals:   07/11/17 0500 07/11/17 0600 07/11/17 0628 07/11/17 0700  BP: 102/74 109/72  109/71  Pulse: 67 77  71  Resp: _0 Temp:   97.9 F (36.6 C)   TempSrc:   Oral   SpO2: 98% 97%  97%  Weight:      Height:        Last BM Date: 07/10/17  Intake/Output   Yesterday:  07/14 0701 - 07/15 0700 In: 4083.3 [I.V.:3383.3; IV Piggyback:700] Out: 2175 [Urine:2175] This shift:  No intake/output data recorded.  Bowel function:  Flatus: YES  BM:  YES  Drain: (No drain)   Physical Exam:  General: Pt awake/alert/oriented x4 in no acute distress.  Somewhat thin with some cachexia. Not toxic. Not sickly  Eyes: PERRL, normal EOM.  Sclera clear.  No icterus Neuro: CN II-XII intact w/o focal sensory/motor deficits. Lymph: No head/neck/groin lymphadenopathy Psych:  No delerium/psychosis/paranoia HENT: Normocephalic, Mucus membranes moist.  No thrush Neck: Supple, No tracheal deviation Chest: No chest wall pain w good excursion CV:  Pulses intact.  Regular rhythm MS: Normal AROM mjr joints.  No obvious deformity  Abdomen: Soft.  Mildy distended.  Nontender.  No evidence of peritonitis.  No incarcerated hernias.  Ext:   No deformity.  No mjr edema.  No cyanosis Skin: No petechiae / purpura  Results:   Labs: Results for orders placed or performed during the hospital encounter of 07/09/17 (from the past 48 hour(s))  CBC with Differential/Platelet     Status: Abnormal   Collection Time: 07/09/17 10:30 PM  Result Value Ref Range   WBC 14.8 (H) 4.0 - 10.5 K/uL   RBC 4.17 (L) 4.22 - 5.81 MIL/uL   Hemoglobin 12.3 (L) 13.0 - 17.0 g/dL   HCT 34.9 (L) 39.0 - 52.0 %   MCV 83.7 78.0 - 100.0 fL   MCH 29.5 26.0 - 34.0 pg   MCHC 35.2 30.0 - 36.0 g/dL   RDW 14.7 11.5 - 15.5 %   Platelets 118 (L) 150 - 400 K/uL    Comment: REPEATED TO VERIFY SPECIMEN CHECKED FOR CLOTS PLATELET COUNT CONFIRMED BY SMEAR    Neutrophils Relative % 82 %   Neutro Abs 12.1 (H) 1.7 - 7.7 K/uL   Lymphocytes Relative 10 %   Lymphs Abs 1.5 0.7 - 4.0 K/uL   Monocytes Relative 7 %   Monocytes Absolute 1.1 (H) 0.1 - 1.0 K/uL   Eosinophils Relative 0 %   Eosinophils Absolute 0.0 0.0 - 0.7 K/uL   Basophils Relative 0 %   Basophils Absolute 0.0 0.0 - 0.1 K/uL  Comprehensive metabolic panel     Status: Abnormal   Collection Time: 07/09/17 10:30 PM  Result Value Ref Range   Sodium 128 (L) 135 - 145 mmol/L   Potassium 3.6 3.5 - 5.1 mmol/L   Chloride 98 (L) 101 - 111 mmol/L   CO2 21 (L) 22 - 32 mmol/L   Glucose, Bld 114 (H) 65 - 99 mg/dL   BUN 10 6 - 20 mg/dL   Creatinine, Ser 0.97 0.61 - 1.24 mg/dL   Calcium 8.3 (L) 8.9 - 10.3 mg/dL   Total Protein 7.9 6.5 - 8.1 g/dL   Albumin 2.9 (L) 3.5 - 5.0 g/dL   AST 28 15 - 41 U/L   ALT 12 (L) 17 - 63 U/L   Alkaline Phosphatase 58 38 - 126 U/L   Total Bilirubin 1.5 (H) 0.3 - 1.2 mg/dL   GFR calc non Af Amer >60 >60 mL/min   GFR calc Af Amer >60 >60 mL/min    Comment: (NOTE) The eGFR has been calculated using the CKD EPI equation. This calculation has not been validated in all clinical situations. eGFR's persistently <60 mL/min signify possible Chronic Kidney Disease.    Anion gap 9 5 - 15   Urinalysis, Routine w reflex microscopic     Status: Abnormal   Collection Time: 07/10/17 12:45 AM  Result Value Ref Range   Color, Urine YELLOW YELLOW   APPearance CLEAR CLEAR   Specific Gravity, Urine 1.009 1.005 - 1.030   pH 5.0 5.0 - 8.0   Glucose, UA NEGATIVE NEGATIVE mg/dL   Hgb urine dipstick MODERATE (A) NEGATIVE   Bilirubin Urine NEGATIVE NEGATIVE   Ketones, ur  NEGATIVE NEGATIVE mg/dL   Protein, ur NEGATIVE NEGATIVE mg/dL   Nitrite NEGATIVE NEGATIVE   Leukocytes, UA NEGATIVE NEGATIVE   RBC / HPF 0-5 0 - 5 RBC/hpf   WBC, UA 0-5 0 - 5 WBC/hpf   Bacteria, UA NONE SEEN NONE SEEN   Squamous Epithelial / LPF 0-5 (A) NONE SEEN   Mucous PRESENT   MRSA PCR Screening     Status: None   Collection Time: 07/10/17  2:13 AM  Result Value Ref Range   MRSA by PCR NEGATIVE NEGATIVE    Comment:        The GeneXpert MRSA Assay (FDA approved for NASAL specimens only), is one component of a comprehensive MRSA colonization surveillance program. It is not intended to diagnose MRSA infection nor to guide or monitor treatment for MRSA infections.   Comprehensive metabolic panel     Status: Abnormal   Collection Time: 07/10/17  3:21 AM  Result Value Ref Range   Sodium 132 (L) 135 - 145 mmol/L   Potassium 3.1 (L) 3.5 - 5.1 mmol/L   Chloride 103 101 - 111 mmol/L   CO2 21 (L) 22 - 32 mmol/L   Glucose, Bld 112 (H) 65 - 99 mg/dL   BUN 9 6 - 20 mg/dL   Creatinine, Ser 0.83 0.61 - 1.24 mg/dL   Calcium 8.0 (L) 8.9 - 10.3 mg/dL   Total Protein 6.9 6.5 - 8.1 g/dL   Albumin 2.5 (L) 3.5 - 5.0 g/dL   AST 18 15 - 41 U/L   ALT 9 (L) 17 - 63 U/L   Alkaline Phosphatase 51 38 - 126 U/L   Total Bilirubin 1.0 0.3 - 1.2 mg/dL   GFR calc non Af Amer >60 >60 mL/min   GFR calc Af Amer >60 >60 mL/min    Comment: (NOTE) The eGFR has been calculated using the CKD EPI equation. This calculation has not been validated in all clinical situations. eGFR's persistently <60 mL/min signify possible Chronic  Kidney Disease.    Anion gap 8 5 - 15  CBC     Status: Abnormal   Collection Time: 07/10/17  3:21 AM  Result Value Ref Range   WBC 11.1 (H) 4.0 - 10.5 K/uL   RBC 3.89 (L) 4.22 - 5.81 MIL/uL   Hemoglobin 11.0 (L) 13.0 - 17.0 g/dL   HCT 32.5 (L) 39.0 - 52.0 %   MCV 83.5 78.0 - 100.0 fL   MCH 28.3 26.0 - 34.0 pg   MCHC 33.8 30.0 - 36.0 g/dL   RDW 14.8 11.5 - 15.5 %   Platelets 99 (L) 150 - 400 K/uL    Comment: CONSISTENT WITH PREVIOUS RESULT  HIV antibody (Routine Testing)     Status: None   Collection Time: 07/10/17  3:21 AM  Result Value Ref Range   HIV Screen 4th Generation wRfx Non Reactive Non Reactive    Comment: (NOTE) Performed At: [facility] [address] [place], [place] [id number] Lindon Romp MD XL:[phone]   Lactic acid, plasma     Status: None   Collection Time: 07/10/17  3:21 AM  Result Value Ref Range   Lactic Acid, Venous 0.7 0.5 - 1.9 mmol/L  Procalcitonin     Status: None   Collection Time: 07/10/17  3:21 AM  Result Value Ref Range   Procalcitonin 0.72 ng/mL    Comment:        Interpretation: PCT > 0.5 ng/mL and <= 2 ng/mL: Systemic infection (sepsis) is possible, but  other conditions are known to elevate PCT as well. (NOTE)         ICU PCT Algorithm               Non ICU PCT Algorithm    ----------------------------     ------------------------------         PCT < 0.25 ng/mL                 PCT < 0.1 ng/mL     Stopping of antibiotics            Stopping of antibiotics       strongly encouraged.               strongly encouraged.    ----------------------------     ------------------------------       PCT level decrease by               PCT < 0.25 ng/mL       >= 80% from peak PCT       OR PCT 0.25 - 0.5 ng/mL          Stopping of antibiotics                                             encouraged.     Stopping of antibiotics           encouraged.    ----------------------------     ------------------------------       PCT level  decrease by              PCT >= 0.25 ng/mL       < 80% from peak PCT        AND PCT >= 0.5 ng/mL             Continuing antibiotics                                              encouraged.       Continuing antibiotics            encouraged.    ----------------------------     ------------------------------     PCT level increase compared          PCT > 0.5 ng/mL         with peak PCT AND          PCT >= 0.5 ng/mL             Escalation of antibiotics                                          strongly encouraged.      Escalation of antibiotics        strongly encouraged.   Lactic acid, plasma     Status: None   Collection Time: 07/10/17  6:24 AM  Result Value Ref Range   Lactic Acid, Venous 0.6 0.5 - 1.9 mmol/L  Glucose, capillary     Status: None   Collection Time: 07/10/17  8:43 AM  Result Value Ref Range   Glucose-Capillary 94 65 - 99 mg/dL   Comment 1 Notify RN  Comment 2 Document in Chart   Lipase, blood     Status: None   Collection Time: 07/10/17  4:45 PM  Result Value Ref Range   Lipase 18 11 - 51 U/L  Amylase     Status: None   Collection Time: 07/10/17  4:45 PM  Result Value Ref Range   Amylase 46 28 - 100 U/L  Magnesium     Status: Abnormal   Collection Time: 07/10/17  4:45 PM  Result Value Ref Range   Magnesium 1.3 (L) 1.7 - 2.4 mg/dL  CBC with Differential/Platelet     Status: Abnormal   Collection Time: 07/11/17  3:40 AM  Result Value Ref Range   WBC 8.1 4.0 - 10.5 K/uL   RBC 3.67 (L) 4.22 - 5.81 MIL/uL   Hemoglobin 10.7 (L) 13.0 - 17.0 g/dL   HCT 31.6 (L) 39.0 - 52.0 %   MCV 86.1 78.0 - 100.0 fL   MCH 29.2 26.0 - 34.0 pg   MCHC 33.9 30.0 - 36.0 g/dL   RDW 14.8 11.5 - 15.5 %   Platelets 129 (L) 150 - 400 K/uL   Neutrophils Relative % 72 %   Neutro Abs 5.8 1.7 - 7.7 K/uL   Lymphocytes Relative 16 %   Lymphs Abs 1.3 0.7 - 4.0 K/uL   Monocytes Relative 12 %   Monocytes Absolute 0.9 0.1 - 1.0 K/uL   Eosinophils Relative 1 %   Eosinophils Absolute 0.1  0.0 - 0.7 K/uL   Basophils Relative 1 %   Basophils Absolute 0.0 0.0 - 0.1 K/uL  Magnesium     Status: Abnormal   Collection Time: 07/11/17  3:40 AM  Result Value Ref Range   Magnesium 1.6 (L) 1.7 - 2.4 mg/dL  Comprehensive metabolic panel     Status: Abnormal   Collection Time: 07/11/17  3:40 AM  Result Value Ref Range   Sodium 134 (L) 135 - 145 mmol/L   Potassium 3.8 3.5 - 5.1 mmol/L    Comment: DELTA CHECK NOTED   Chloride 109 101 - 111 mmol/L   CO2 20 (L) 22 - 32 mmol/L   Glucose, Bld 89 65 - 99 mg/dL   BUN 7 6 - 20 mg/dL   Creatinine, Ser 0.76 0.61 - 1.24 mg/dL   Calcium 8.0 (L) 8.9 - 10.3 mg/dL   Total Protein 6.4 (L) 6.5 - 8.1 g/dL   Albumin 2.2 (L) 3.5 - 5.0 g/dL   AST 16 15 - 41 U/L   ALT 10 (L) 17 - 63 U/L   Alkaline Phosphatase 42 38 - 126 U/L   Total Bilirubin 1.0 0.3 - 1.2 mg/dL   GFR calc non Af Amer >60 >60 mL/min   GFR calc Af Amer >60 >60 mL/min    Comment: (NOTE) The eGFR has been calculated using the CKD EPI equation. This calculation has not been validated in all clinical situations. eGFR's persistently <60 mL/min signify possible Chronic Kidney Disease.    Anion gap 5 5 - 15  Lipase, blood     Status: None   Collection Time: 07/11/17  3:40 AM  Result Value Ref Range   Lipase 13 11 - 51 U/L  Protime-INR     Status: Abnormal   Collection Time: 07/11/17  3:40 AM  Result Value Ref Range   Prothrombin Time 16.0 (H) 11.4 - 15.2 seconds   INR 1.27   Glucose, capillary     Status: None   Collection Time: 07/11/17  7:49 AM  Result Value Ref Range   Glucose-Capillary 80 65 - 99 mg/dL   Comment 1 Notify RN    Comment 2 Document in Chart     Imaging / Studies: Ct Abdomen Pelvis W Contrast  Result Date: 07/10/2017 CLINICAL DATA:  Colitis EXAM: CT ABDOMEN AND PELVIS WITH CONTRAST TECHNIQUE: Multidetector CT imaging of the abdomen and pelvis was performed using the standard protocol following bolus administration of intravenous contrast. CONTRAST:  161m  ISOVUE-300 IOPAMIDOL (ISOVUE-300) INJECTION 61% COMPARISON:  07/05/2017 FINDINGS: Lower chest: Bibasilar dependent atelectasis versus airspace disease. Small pleural effusions. Hepatobiliary: Diffuse hepatic steatosis. There are varices anterior to the liver. Gallbladder is unremarkable. Pancreas: The mass invading the tail of the pancreas and hilum of the spleen measures 5.8 x 3.8 cm previously measured 5.7 x 4.0 cm. There is fluid density surrounding the body and tail of the pancreas suggesting an inflammatory process of the pancreatic parenchyma. There is no pancreatic hemorrhage or necrosis. Spleen: The mass extending from the tail of the pancreas invades the spleen. There are scattered areas of low density within the spleen that have increased which may represent worsening tumor infiltration. Spleen laceration can have a similar appearance but there is no obvious intraperitoneal hemorrhage. Adrenals/Urinary Tract: Kidneys are unremarkable. Adrenal glands are unremarkable. Bladder is unremarkable. Stomach/Bowel: Wall thickening of the distal transverse colon has improved. There is persistent wall thickening of the colon at the splenic flexure and descending colon. The wall thickening is more low density suggesting an inflammatory process. There is a mass medial to the descending colon on image 33 which measures 2.1 x 2.6 cm and previously measured 1.9 x 2.5 cm. It is not significantly changed. There is a mass adjacent to the greater curvature of the stomach on image 24 measuring 2.2 x 2.4 cm and previously measuring 1.9 x 1.9 cm. It is larger. There is now all large fluid and gas containing collection just inferior to the mass invading the pancreatic tail and splenic flexure worrisome for abscess. It measures 3.8 x 4.6 cm on image 24. The mass is surrounded by bowel loops and spleen. Percutaneous drainage would be difficult. Vascular/Lymphatic: No abnormal retroperitoneal adenopathy. No evidence of aortic  aneurysm. Atherosclerotic calcifications of the aorta are noted. Reproductive: Prostate is upper normal in size. Other: No abdominal wall hernia. Musculoskeletal: Degenerative disc disease in the lumbar spine. No vertebral compression. IMPRESSION: The salient finding is a new abscess measuring 3.8 x 4.6 cm inferior to the pancreatic tail and spleen. Findings related to a mass in the pancreatic tail and spleen have likely progressed. Linear areas in the spleen may represent tumor infiltration. Spleen laceration can have a similar appearance, however there is no intraperitoneal hemorrhage to support this diagnosis. Bibasilar atelectasis versus airspace disease. Small pleural effusions. 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 Possible inflammatory change of the pancreas without evidence of necrosis or hemorrhage. Correlation with lipase is recommended. Wall thickening of the colon has improved, but it is of lower density today suggesting inflammation. Ischemia and tumor infiltration can have a similar appearance. There is no pneumatosis. The mass is adjacent to the descending colon and greater curvature of the stomach are described above. They have slightly worsened. Electronically Signed   By: AMarybelle KillingsM.D.   On: 07/10/2017 13:46    Medications / Allergies: per chart  Antibiotics: Anti-infectives    Start     Dose/Rate Route Frequency Ordered Stop   07/10/17 1800  piperacillin-tazobactam (ZOSYN) IVPB 3.375 g     3.375  g 12.5 mL/hr over 240 Minutes Intravenous Every 8 hours 07/10/17 1626     07/10/17 0045  metroNIDAZOLE (FLAGYL) IVPB 500 mg  Status:  Discontinued     500 mg 100 mL/hr over 60 Minutes Intravenous Every 8 hours 07/10/17 0039 07/10/17 1607   07/10/17 0045  ciprofloxacin (CIPRO) IVPB 400 mg  Status:  Discontinued     400 mg 200 mL/hr over 60 Minutes Intravenous Every 12 hours 07/10/17 0042 07/10/17 1607        Note: Portions of this report may have been transcribed using voice recognition  software. Every effort was made to ensure accuracy; however, inadvertent computerized transcription errors may be present.   Any transcriptional errors that result from this process are unintentional.     Adin Hector, M.D., F.A.C.S. Gastrointestinal and Minimally Invasive Surgery [facility] [address] [place], [place] [address] [phone] Main / Paging   07/11/2017

## 2017-07-11 NOTE — Sedation Documentation (Signed)
Patient denies pain and is resting comfortably.  

## 2017-07-11 NOTE — Progress Notes (Signed)
PROGRESS NOTE [facility]   Jeff [name]   UJW:[id number] [name]: [birth year]  [name]: 07/09/2017 PCP: [name], MD   Brief Narrative:  56 year old male with medical history significant for colon cancer on chemotherapy, DVT on Xarelto, anemia, depression presented with abdominal pain and diarrhea for 3 days prior to admission. Patient reported symptoms started after chemotherapy which was done 07/08/17. Patient was admitted for possible colitis and was started on Cipro and Flagyl. Despite patient being nothing by mouth and being treated with antibiotic patient continued with abdominal pain, a CT of the abdomen was done which showed a new abscess measuring 3.8 x 4.6 cm inferior to the pancreatic tail and spleen. There is also some inflammatory changes around the pancreas may be suggesting pancreatitis. Surgical team and medical oncology has been consulted.  Subjective: Patient seen and examined, not very engaging in conversation, but denies abdominal pain and N/V. Had some loose stools but not diarrhea. No appetite. Otherwise no concerns.    Assessment & Plan: Abdominal pain/Sepsis - secondary to ? Pancreatic abscess with pancreatitis vs perf  Patient met sepsis criteria on initial presentation with fever leukocytosis and tachycardia - sepsis physiology has resolved.  Initially with mild hypotension which responded well to IV fluid resuscitation. Patient initially treated for colitis, was started on Cipro and Flagyl - switched to Zosyn 07/10/17 General surgery consulted recommendations appreciated  Lipase and Amylase normal - less likely to be pancreatitis  Keen NPO  Continue IVF  Follow-up cultures For CT guided aspiration by IR once off Xarelto > 48 hrs - last dose was on Friday.  Continue Supportive treatment pain medication as needed, Zofran for nausea.  Adenocarcinoma of the ascending colon Currently on chemotherapy, last dose 07/08/2017. Per oncology   DVT of right lower  extremity Patient on Xarelto - which is been held for surgical procedure   Hyponatremia - improved  Likely secondary to GI losses Patient currently receiving IVF  Monitor in am   Normocytic anemia  Likely hemodilution from aggressive IVF  Monitor CBC   DVT prophylaxis: SCDs, Xarelto hold Code Status: Full code Family Communication: None at bedside Disposition Plan: Can transfer to med surg. Plan for possible procedure in AM by IR   Consultants:   Gen Surgery   IR   Oncology   Procedures:   None  Antimicrobials: Anti-infectives    Start     Dose/Rate Route Frequency Ordered Stop   07/10/17 1800  piperacillin-tazobactam (ZOSYN) IVPB 3.375 g     3.375 g 12.5 mL/hr over 240 Minutes Intravenous Every 8 hours 07/10/17 1626     07/10/17 0045  metroNIDAZOLE (FLAGYL) IVPB 500 mg  Status:  Discontinued     500 mg 100 mL/hr over 60 Minutes Intravenous Every 8 hours 07/10/17 0039 07/10/17 1607   07/10/17 0045  ciprofloxacin (CIPRO) IVPB 400 mg  Status:  Discontinued     400 mg 200 mL/hr over 60 Minutes Intravenous Every 12 hours 07/10/17 0042 07/10/17 1607      Objective: Vitals:   07/11/17 0500 07/11/17 0600 07/11/17 0628 07/11/17 0700  BP: 102/74 109/72  109/71  Pulse: 67 77  71  Resp: 16 16  16   Temp:   97.9 F (36.6 C)   TempSrc:   Oral   SpO2: 98% 97%  97%  Weight:      Height:        Intake/Output Summary (Last 24 hours) at 07/11/17 0831 Last data filed at 07/11/17 0629  Gross per 24  hour  Intake          3420.83 ml  Output             2175 ml  Net          1245.83 ml   Filed Weights   07/10/17 0200  Weight: 57.5 kg (126 lb 12.2 oz)    Examination:  General: Pt is alert, awake, not in acute distress Cardiovascular: RRR, S1/S2 +, no rubs, no gallops Respiratory: CTA bilaterally, no wheezing, no rhonchi Abdominal: Soft, NT, mild distention, bowel sounds + Extremities: no edema Psych: Mood flat  Data Reviewed: I have personally reviewed following  labs and imaging studies  CBC:  Recent Labs Lab 07/06/17 0823 07/09/17 2230 07/10/17 0321 07/11/17 0340  WBC 12.3* 14.8* 11.1* 8.1  NEUTROABS 9.3* 12.1*  --  5.8  HGB 12.8* 12.3* 11.0* 10.7*  HCT 39.2 34.9* 32.5* 31.6*  MCV 88.2 83.7 83.5 86.1  PLT 84* 118* 99* 161*   Basic Metabolic Panel:  Recent Labs Lab 07/06/17 0823 07/09/17 2230 07/10/17 0321 07/10/17 1645 07/11/17 0340  NA 134* 128* 132*  --  134*  K 3.6 3.6 3.1*  --  3.8  CL  --  98* 103  --  109  CO2 23 21* 21*  --  20*  GLUCOSE 102 114* 112*  --  89  BUN 5.1* 10 9  --  7  CREATININE 0.8 0.97 0.83  --  0.76  CALCIUM 9.3 8.3* 8.0*  --  8.0*  MG 1.3*  --   --  1.3* 1.6*   GFR: Estimated Creatinine Clearance: 81.9 mL/min (by C-G formula based on SCr of 0.76 mg/dL). Liver Function Tests:  Recent Labs Lab 07/06/17 0823 07/09/17 2230 07/10/17 0321 07/11/17 0340  AST 22 28 18 16   ALT 7 12* 9* 10*  ALKPHOS 100 58 51 42  BILITOT 0.59 1.5* 1.0 1.0  PROT 8.0 7.9 6.9 6.4*  ALBUMIN 2.9* 2.9* 2.5* 2.2*    Recent Labs Lab 07/10/17 1645 07/11/17 0340  LIPASE 18 13  AMYLASE 46  --    Coagulation Profile:  Recent Labs Lab 07/11/17 0340  INR 1.27   CBG:  Recent Labs Lab 07/10/17 0843 07/11/17 0749  GLUCAP 94 80   Sepsis Labs:  Recent Labs Lab 07/10/17 0321 07/10/17 0624  PROCALCITON 0.72  --   LATICACIDVEN 0.7 0.6    Recent Results (from the past 240 hour(s))  MRSA PCR Screening     Status: None   Collection Time: 07/10/17  2:13 AM  Result Value Ref Range Status   MRSA by PCR NEGATIVE NEGATIVE Final    Comment:        The GeneXpert MRSA Assay (FDA approved for NASAL specimens only), is one component of a comprehensive MRSA colonization surveillance program. It is not intended to diagnose MRSA infection nor to guide or monitor treatment for MRSA infections.      Radiology Studies: Ct Abdomen Pelvis W Contrast  Result Date: 07/10/2017 CLINICAL DATA:  Colitis EXAM: CT  ABDOMEN AND PELVIS WITH CONTRAST TECHNIQUE: Multidetector CT imaging of the abdomen and pelvis was performed using the standard protocol following bolus administration of intravenous contrast. CONTRAST:  111m ISOVUE-300 IOPAMIDOL (ISOVUE-300) INJECTION 61% COMPARISON:  07/05/2017 FINDINGS: Lower chest: Bibasilar dependent atelectasis versus airspace disease. Small pleural effusions. Hepatobiliary: Diffuse hepatic steatosis. There are varices anterior to the liver. Gallbladder is unremarkable. Pancreas: The mass invading the tail of the pancreas and hilum of the spleen measures  5.8 x 3.8 cm previously measured 5.7 x 4.0 cm. There is fluid density surrounding the body and tail of the pancreas suggesting an inflammatory process of the pancreatic parenchyma. There is no pancreatic hemorrhage or necrosis. Spleen: The mass extending from the tail of the pancreas invades the spleen. There are scattered areas of low density within the spleen that have increased which may represent worsening tumor infiltration. Spleen laceration can have a similar appearance but there is no obvious intraperitoneal hemorrhage. Adrenals/Urinary Tract: Kidneys are unremarkable. Adrenal glands are unremarkable. Bladder is unremarkable. Stomach/Bowel: Wall thickening of the distal transverse colon has improved. There is persistent wall thickening of the colon at the splenic flexure and descending colon. The wall thickening is more low density suggesting an inflammatory process. There is a mass medial to the descending colon on image 33 which measures 2.1 x 2.6 cm and previously measured 1.9 x 2.5 cm. It is not significantly changed. There is a mass adjacent to the greater curvature of the stomach on image 24 measuring 2.2 x 2.4 cm and previously measuring 1.9 x 1.9 cm. It is larger. There is now all large fluid and gas containing collection just inferior to the mass invading the pancreatic tail and splenic flexure worrisome for abscess. It  measures 3.8 x 4.6 cm on image 24. The mass is surrounded by bowel loops and spleen. Percutaneous drainage would be difficult. Vascular/Lymphatic: No abnormal retroperitoneal adenopathy. No evidence of aortic aneurysm. Atherosclerotic calcifications of the aorta are noted. Reproductive: Prostate is upper normal in size. Other: No abdominal wall hernia. Musculoskeletal: Degenerative disc disease in the lumbar spine. No vertebral compression. IMPRESSION: The salient finding is a new abscess measuring 3.8 x 4.6 cm inferior to the pancreatic tail and spleen. Findings related to a mass in the pancreatic tail and spleen have likely progressed. Linear areas in the spleen may represent tumor infiltration. Spleen laceration can have a similar appearance, however there is no intraperitoneal hemorrhage to support this diagnosis. Bibasilar atelectasis versus airspace disease. Small pleural effusions. 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 Possible inflammatory change of the pancreas without evidence of necrosis or hemorrhage. Correlation with lipase is recommended. Wall thickening of the colon has improved, but it is of lower density today suggesting inflammation. Ischemia and tumor infiltration can have a similar appearance. There is no pneumatosis. The mass is adjacent to the descending colon and greater curvature of the stomach are described above. They have slightly worsened. Electronically Signed   By: Marybelle Killings M.D.   On: 07/10/2017 13:46     Scheduled Meds: . Clindamycin Phos-Benzoyl Perox  1 application Topical BID  . hydrocortisone cream   Topical BID  . lip balm  1 application Topical BID  . mouth rinse  15 mL Mouth Rinse BID  . mirtazapine  15 mg Oral QHS  . pantoprazole (PROTONIX) IV  40 mg Intravenous Q12H  . saccharomyces boulardii  250 mg Oral BID  . sodium chloride flush  3 mL Intravenous Q12H   Continuous Infusions: . sodium chloride 125 mL/hr at 07/11/17 0600  . lactated ringers    . magnesium  sulfate 1 - 4 g bolus IVPB    . methocarbamol (ROBAXIN)  IV    . piperacillin-tazobactam (ZOSYN)  IV Stopped (07/11/17 0512)     LOS: 1 day   Time spent: Total of 25 minutes spent with pt, greater than 50% of which was spent in discussion of  treatment, counseling and coordination of care   Chipper Oman,  MD Pager: Text Page via [email or web address]  [phone]  If 7PM-7AM, please contact night-coverage [email or web address] Password [facility] 07/11/2017, 8:31 AM
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
## 2017-07-11 NOTE — Progress Notes (Signed)
HEMATOLOGY-ONCOLOGY PROGRESS NOTE  SUBJECTIVE: Admitted for diarrhea and abdominal pain with a prior history of metastatic colon cancer on chemotherapy. Also history of DVT on Xarelto. Patient had a CT scan which revealed a pancreatic abscess. He underwent percutaneous drainage of the abscess on 07/11/2017. On admission he was found to be septic with hypotension. The blood pressure had improved with IV fluids.  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[facility]) Staging form: Colon and Rectum, AJCC 8th Edition - Clinical stage from 01/12/2017: Stage IVC (cTX, cNX, pM1c) - Signed by [name], MD on 01/20/2017       Colon cancer splenic flexure metastatic to spleen & pancreas   01/10/2017 Imaging    CT ABD/PELVIS: 8 mm right liver mass, mass lesion at pancreatic tail; 9.6 x11.1 x 8.1 in left abdomen; splenic flexure and proimal descending colon become incorporated; diffuse mesenteric edema      01/11/2017 Tumor Marker    CEA=10.7      01/11/2017 Imaging    CT CHEST: Negative      01/12/2017 Initial Diagnosis    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 MSI stable, tumor mutation burden low.      02/17/2017 Imaging    Lower Extremity Ultrasound  Bilateral lower extremity  venous duplex complete. There is evidence of deep vein thrombosis involving the femoral, popliteal, and peroneal veins of the right lower extremity.  There is no evidence of superficial vein thrombosis involving the right lower extremity. There is evidence of superficial vein thrombosis involving the lesser saphenous vein of the left lower extremity. There is no evidence of deep vein thrombosis involving the left lower extremity. There is no evidence of a Baker's cyst bilaterally.      04/09/2017 Imaging    CT CAP w Contrast 04/09/2017 IMPRESSION: 1. Response to therapy with decreased peritoneal tumor volume throughout the abdomen. 2. No well-defined residual colonic mass. No bowel obstruction or other acute complication. 3. Decrease in gastrohepatic ligament adenopathy. 4. No new sites of disease. 5.  No acute process or evidence of metastatic disease in the chest. 6.  Aortic atherosclerosis. 7. Mild gynecomastia.      04/18/2017 Miscellaneous    Patient presented to ED with complaints of epistaxis. Resolved and patient was discharged home same day.      07/05/2017 Imaging    CT CAP w contrast IMPRESSION: 1. Today's study demonstrates some positive response to therapy with decreased size of mass associated with the descending colon, decreased size of the invasive mass extending from the tail of the pancreas into the splenic hilum and decreased size of adjacent peritoneal lesions. There has also been regression of upper abdominal lymphadenopathy. 2. No new pulmonary or hepatic  lesions identified to suggest progressive metastatic disease. 3. Aortic atherosclerosis. 4. Additional incidental findings, as above.       OBJECTIVE: REVIEW OF SYSTEMS:   Constitutional: Denies fevers, chills or abnormal weight loss Eyes: Denies blurriness of vision Ears, nose, mouth, throat, and face: Denies mucositis or sore throat Respiratory: Denies cough, dyspnea or wheezes Cardiovascular:  Denies palpitation, chest discomfort Gastrointestinal:  Diarrhea and abdominal pain Skin: Denies abnormal skin rashes Lymphatics: Denies new lymphadenopathy or easy bruising Neurological:Denies numbness, tingling or new weaknesses Behavioral/Psych: Mood is stable, no new changes  Extremities: No lower extremity edema  All other systems were reviewed with the patient and are negative.  I have reviewed the past medical history, past surgical history, social history and family history with the patient and they are unchanged from previous note.   PHYSICAL EXAMINATION: ECOG PERFORMANCE STATUS: 2 - Symptomatic, <50% confined to bed  Vitals:   07/11/17 1110 07/11/17 1217  BP: 110/74   Pulse: 79   Resp: 17   Temp:  97.7 F (36.5 C)   Filed Weights   07/10/17 0200  Weight: 126 lb 12.2 oz (57.5 kg)    GENERAL:alert, no distress and comfortable SKIN: skin color, texture, turgor are normal, no rashes or significant lesions EYES: normal, Conjunctiva are pink and non-injected, sclera clear OROPHARYNX:no exudate, no erythema and lips, buccal mucosa, and tongue normal  NECK: supple, thyroid normal size, non-tender, without nodularity LYMPH:  no palpable lymphadenopathy in the cervical, axillary or inguinal LUNGS: clear to auscultation and percussion with normal breathing effort HEART: regular rate & rhythm and no murmurs and no lower extremity edema ABDOMEN:Recent percutaneous drainage of pancreatic abscess Musculoskeletal:no cyanosis of digits and no clubbing  NEURO: alert & oriented x 3 with fluent speech, no focal motor/sensory deficits  LABORATORY DATA:  I have reviewed the data as listed CMP Latest Ref Rng & Units 07/11/2017 07/10/2017 07/09/2017  Glucose 65 - 99 mg/dL 89 112(H) 114(H)  BUN 6 - 20 mg/dL 7 9 10   Creatinine 0.61 - 1.24 mg/dL 0.76 0.83 0.97  Sodium 135 - 145 mmol/L 134(L) 132(L) 128(L)  Potassium 3.5 - 5.1 mmol/L 3.8 3.1(L) 3.6  Chloride 101 - 111 mmol/L 109 103 98(L)   CO2 22 - 32 mmol/L 20(L) 21(L) 21(L)  Calcium 8.9 - 10.3 mg/dL 8.0(L) 8.0(L) 8.3(L)  Total Protein 6.5 - 8.1 g/dL 6.4(L) 6.9 7.9  Total Bilirubin 0.3 - 1.2 mg/dL 1.0 1.0 1.5(H)  Alkaline Phos 38 - 126 U/L 42 51 58  AST 15 - 41 U/L 16 18 28   ALT 17 - 63 U/L 10(L) 9(L) 12(L)    Lab Results  Component Value Date   WBC 8.1 07/11/2017   HGB 10.7 (L) 07/11/2017   HCT 31.6 (L) 07/11/2017   MCV 86.1 07/11/2017   PLT 129 (L) 07/11/2017   NEUTROABS 5.8 07/11/2017    ASSESSMENT AND PLAN: 1. Metastatic colon cancer: Currently on palliative chemotherapy, last chemotherapy 07/08/2017 2. pancreatic abscess 3.8 x 4.6 cm the tail and spleen of the pancreas. Status post percutaneous drainage 15 mL removed cytology pathology and microbiology are pending. 3. Anemia and thrombocytopenia due to chemotherapy. No further oncology recommendations at this time. [name] will follow the patient from tomorrow.

## 2017-07-12 DIAGNOSIS — Z7901 Long term (current) use of anticoagulants: Secondary | ICD-10-CM

## 2017-07-12 DIAGNOSIS — I82401 Acute embolism and thrombosis of unspecified deep veins of right lower extremity: Secondary | ICD-10-CM

## 2017-07-12 DIAGNOSIS — A419 Sepsis, unspecified organism: Principal | ICD-10-CM

## 2017-07-12 DIAGNOSIS — E440 Moderate protein-calorie malnutrition: Secondary | ICD-10-CM

## 2017-07-12 DIAGNOSIS — C786 Secondary malignant neoplasm of retroperitoneum and peritoneum: Secondary | ICD-10-CM

## 2017-07-12 DIAGNOSIS — K8689 Other specified diseases of pancreas: Secondary | ICD-10-CM

## 2017-07-12 DIAGNOSIS — D630 Anemia in neoplastic disease: Secondary | ICD-10-CM

## 2017-07-12 LAB — BASIC METABOLIC PANEL
ANION GAP: 8 (ref 5–15)
BUN: 7 mg/dL (ref 6–20)
CALCIUM: 8.2 mg/dL — AB (ref 8.9–10.3)
CO2: 18 mmol/L — AB (ref 22–32)
CREATININE: 0.74 mg/dL (ref 0.61–1.24)
Chloride: 109 mmol/L (ref 101–111)
GFR calc Af Amer: >60 mL/min (ref >60)
GLUCOSE: 60 mg/dL — AB (ref 65–99)
Potassium: 3.8 mmol/L (ref 3.5–5.1)
Sodium: 135 mmol/L (ref 135–145)

## 2017-07-12 LAB — CBC WITH DIFFERENTIAL/PLATELET
BASOS ABS: 0.1 10*3/uL (ref 0.0–0.1)
BASOS PCT: 1 %
EOS ABS: 0.1 10*3/uL (ref 0.0–0.7)
EOS PCT: 1 %
HCT: 31.4 % — ABNORMAL LOW (ref 39.0–52.0)
Hemoglobin: 10.7 g/dL — ABNORMAL LOW (ref 13.0–17.0)
Lymphocytes Relative: 19 %
Lymphs Abs: 1.4 10*3/uL (ref 0.7–4.0)
MCH: 29 pg (ref 26.0–34.0)
MCHC: 34.1 g/dL (ref 30.0–36.0)
MCV: 85.1 fL (ref 78.0–100.0)
MONO ABS: 0.6 10*3/uL (ref 0.1–1.0)
MONOS PCT: 8 %
Neutro Abs: 5.5 10*3/uL (ref 1.7–7.7)
Neutrophils Relative %: 71 %
PLATELETS: 152 10*3/uL (ref 150–400)
RBC: 3.69 MIL/uL — ABNORMAL LOW (ref 4.22–5.81)
RDW: 15 % (ref 11.5–15.5)
WBC: 7.6 10*3/uL (ref 4.0–10.5)

## 2017-07-12 LAB — GLUCOSE, CAPILLARY
GLUCOSE-CAPILLARY: 102 mg/dL — AB (ref 65–99)
Glucose-Capillary: 55 mg/dL — ABNORMAL LOW (ref 65–99)

## 2017-07-12 LAB — MAGNESIUM: MAGNESIUM: 1.4 mg/dL — AB (ref 1.7–2.4)

## 2017-07-12 MED ORDER — DEXTROSE 5 % IV SOLN
2.0000 g | INTRAVENOUS | Status: DC
  Administered 2017-07-12 – 2017-07-13 (×2): 2 g via INTRAVENOUS
  Filled 2017-07-12 (×3): qty 2

## 2017-07-12 MED ORDER — SODIUM CHLORIDE 0.9% FLUSH: 10.0000 mL | INTRAVENOUS | Status: DC | PRN

## 2017-07-12 MED ORDER — MAGNESIUM SULFATE 2 GM/50ML IV SOLN
2.0000 g | Freq: Once | INTRAVENOUS | Status: AC
  Administered 2017-07-12: 2 g via INTRAVENOUS
  Filled 2017-07-12: qty 50

## 2017-07-12 MED ORDER — DEXTROSE 50 % IV SOLN: 25.0000 mL | Freq: Once | INTRAVENOUS | Status: DC

## 2017-07-12 MED ORDER — DEXTROSE 50 % IV SOLN
INTRAVENOUS | Status: AC
  Administered 2017-07-12: 50 mL
  Filled 2017-07-12: qty 50

## 2017-07-12 MED ORDER — RIVAROXABAN 20 MG PO TABS
20.0000 mg | ORAL_TABLET | Freq: Every day | ORAL | Status: DC
  Administered 2017-07-12 – 2017-07-13 (×2): 20 mg via ORAL
  Filled 2017-07-12 (×2): qty 1

## 2017-07-12 MED ORDER — METRONIDAZOLE 500 MG PO TABS
500.0000 mg | ORAL_TABLET | Freq: Three times a day (TID) | ORAL | Status: DC
  Administered 2017-07-12 – 2017-07-14 (×6): 500 mg via ORAL
  Filled 2017-07-12 (×6): qty 1

## 2017-07-12 NOTE — Progress Notes (Signed)
Chief Complaint: Patient was seen today for follow up pancreatic abscess drain  Referring Physician(s): Dr. Patrecia Pour  Supervising Physician: Sandi Mariscal  Patient Status: [facility] - In-pt  Subjective: Follow up pancreatic abscess drain placed yesterday. Pt feeling a little better. Some soreness  Objective: Physical Exam: BP 110/68 (BP Location: Left Arm)   Pulse 92   Temp 98.7 F (37.1 C) (Oral)   Resp 18   Ht 5\' 6"  (1.676 m)   Wt 132 lb 4.4 oz (60 kg)   SpO2 99%   BMI 21.35 kg/m  Drain intact, site clean. Serosanguinous output   Current Facility-Administered Medications:  .  0.9 %  sodium chloride infusion, , Intravenous, Continuous, Gross, Remo Lipps, MD, Last Rate: 100 mL/hr at 07/11/17 2317, 1,000 mL at 07/11/17 2317 .  acetaminophen (TYLENOL) tablet 650 mg, 650 mg, Oral, Q6H PRN **OR** acetaminophen (TYLENOL) suppository 650 mg, 650 mg, Rectal, Q6H PRN, Ivor Costa, MD .  alum & mag hydroxide-simeth (MAALOX/MYLANTA) 200-200-20 MG/5ML suspension 30 mL, 30 mL, Oral, Q6H PRN, Michael Boston, MD .  Clindamycin Phos-Benzoyl Perox 5.8-5.2 % 1 application, 1 application, Topical, BID, Niu, Xilin, MD .  dextrose 50 % solution 25 mL, 25 mL, Intravenous, Once, Patrecia Pour, Edwin, MD .  diphenhydrAMINE (BENADRYL) injection 12.5-25 mg, 12.5-25 mg, Intravenous, Q6H PRN, Absher, Julieta Bellini, RPH .  guaiFENesin-dextromethorphan (ROBITUSSIN DM) 100-10 MG/5ML syrup 10 mL, 10 mL, Oral, Q4H PRN, Michael Boston, MD .  hydrocortisone (ANUSOL-HC) 2.5 % rectal cream 1 application, 1 application, Topical, QID PRN, Michael Boston, MD .  hydrocortisone cream 1 % 1 application, 1 application, Topical, TID PRN, Michael Boston, MD .  hydrocortisone cream 1 %, , Topical, BID, Ivor Costa, MD .  lactated ringers bolus 1,000 mL, 1,000 mL, Intravenous, Q8H PRN, Michael Boston, MD .  lip balm (CARMEX) ointment 1 application, 1 application, Topical, BID, Michael Boston, MD, 1 application at 77/82/42 1000 .   loperamide (IMODIUM) capsule 2 mg, 2 mg, Oral, QID PRN, Ivor Costa, MD .  magic mouthwash, 15 mL, Oral, QID PRN, Michael Boston, MD .  MEDLINE mouth rinse, 15 mL, Mouth Rinse, BID, Ivor Costa, MD, 15 mL at 07/12/17 1000 .  menthol-cetylpyridinium (CEPACOL) lozenge 3 mg, 1 lozenge, Oral, PRN, Michael Boston, MD .  methocarbamol (ROBAXIN) 1,000 mg in dextrose 5 % 50 mL IVPB, 1,000 mg, Intravenous, Q6H PRN, Michael Boston, MD .  metoCLOPramide (REGLAN) injection 5-10 mg, 5-10 mg, Intravenous, Q6H PRN, Michael Boston, MD .  mirtazapine (REMERON) tablet 15 mg, 15 mg, Oral, QHS, Ivor Costa, MD, 15 mg at 07/11/17 2315 .  morphine 2 MG/ML injection 2 mg, 2 mg, Intravenous, Q4H PRN, Ivor Costa, MD, 2 mg at 07/11/17 1240 .  ondansetron (ZOFRAN) injection 4 mg, 4 mg, Intravenous, Q8H PRN, Ivor Costa, MD, 4 mg at 07/10/17 1843 .  pantoprazole (PROTONIX) injection 40 mg, 40 mg, Intravenous, Q12H, Michael Boston, MD, 40 mg at 07/12/17 0809 .  phenol (CHLORASEPTIC) mouth spray 1-2 spray, 1-2 spray, Mouth/Throat, PRN, Gross, Remo Lipps, MD .  piperacillin-tazobactam (ZOSYN) IVPB 3.375 g, 3.375 g, Intravenous, Q8H, Glogovac, Nikola, RPH, Last Rate: 12.5 mL/hr at 07/12/17 0810, 3.375 g at 07/12/17 0810 .  prochlorperazine (COMPAZINE) injection 5-10 mg, 5-10 mg, Intravenous, Q4H PRN, Michael Boston, MD .  saccharomyces boulardii (FLORASTOR) capsule 250 mg, 250 mg, Oral, BID, Michael Boston, MD, 250 mg at 07/12/17 0809 .  sodium chloride flush (NS) 0.9 % injection 3 mL, 3 mL, Intravenous, Q12H, Ivor Costa, MD, 3  mL at 07/12/17 0809 .  sodium chloride flush (NS) 0.9 % injection 5 mL, 5 mL, Intravenous, Q8H, Shick, Michael, MD .  zolpidem (AMBIEN) tablet 5 mg, 5 mg, Oral, QHS PRN, Ivor Costa, MD  Facility-Administered Medications Ordered in Other Encounters:  .  0.9 %  sodium chloride infusion, , Intravenous, Once, [name], MD .  sodium chloride flush (NS) 0.9 % injection 10 mL, 10 mL, Intracatheter, PRN, [name], MD, 10 mL  at 07/08/17 1356  Labs: CBC  Recent Labs  07/11/17 0340 07/12/17 0625  WBC 8.1 7.6  HGB 10.7* 10.7*  HCT 31.6* 31.4*  PLT 129* 152   BMET  Recent Labs  07/11/17 0340 07/12/17 0625  NA 134* 135  K 3.8 3.8  CL 109 109  CO2 20* 18*  GLUCOSE 89 60*  BUN 7 7  CREATININE 0.76 0.74  CALCIUM 8.0* 8.2*   LFT  Recent Labs  07/11/17 0340  PROT 6.4*  ALBUMIN 2.2*  AST 16  ALT 10*  ALKPHOS 42  BILITOT 1.0  LIPASE 13   PT/INR  Recent Labs  07/11/17 0340  LABPROT 16.0*  INR 1.27     Studies/Results: Ct Abdomen Pelvis W Contrast  Result Date: 07/10/2017 CLINICAL DATA:  Colitis EXAM: CT ABDOMEN AND PELVIS WITH CONTRAST TECHNIQUE: Multidetector CT imaging of the abdomen and pelvis was performed using the standard protocol following bolus administration of intravenous contrast. CONTRAST:  164mL ISOVUE-300 IOPAMIDOL (ISOVUE-300) INJECTION 61% COMPARISON:  07/05/2017 FINDINGS: Lower chest: Bibasilar dependent atelectasis versus airspace disease. Small pleural effusions. Hepatobiliary: Diffuse hepatic steatosis. There are varices anterior to the liver. Gallbladder is unremarkable. Pancreas: The mass invading the tail of the pancreas and hilum of the spleen measures 5.8 x 3.8 cm previously measured 5.7 x 4.0 cm. There is fluid density surrounding the body and tail of the pancreas suggesting an inflammatory process of the pancreatic parenchyma. There is no pancreatic hemorrhage or necrosis. Spleen: The mass extending from the tail of the pancreas invades the spleen. There are scattered areas of low density within the spleen that have increased which may represent worsening tumor infiltration. Spleen laceration can have a similar appearance but there is no obvious intraperitoneal hemorrhage. Adrenals/Urinary Tract: Kidneys are unremarkable. Adrenal glands are unremarkable. Bladder is unremarkable. Stomach/Bowel: Wall thickening of the distal transverse colon has improved. There is  persistent wall thickening of the colon at the splenic flexure and descending colon. The wall thickening is more low density suggesting an inflammatory process. There is a mass medial to the descending colon on image 33 which measures 2.1 x 2.6 cm and previously measured 1.9 x 2.5 cm. It is not significantly changed. There is a mass adjacent to the greater curvature of the stomach on image 24 measuring 2.2 x 2.4 cm and previously measuring 1.9 x 1.9 cm. It is larger. There is now all large fluid and gas containing collection just inferior to the mass invading the pancreatic tail and splenic flexure worrisome for abscess. It measures 3.8 x 4.6 cm on image 24. The mass is surrounded by bowel loops and spleen. Percutaneous drainage would be difficult. Vascular/Lymphatic: No abnormal retroperitoneal adenopathy. No evidence of aortic aneurysm. Atherosclerotic calcifications of the aorta are noted. Reproductive: Prostate is upper normal in size. Other: No abdominal wall hernia. Musculoskeletal: Degenerative disc disease in the lumbar spine. No vertebral compression. IMPRESSION: The salient finding is a new abscess measuring 3.8 x 4.6 cm inferior to the pancreatic tail and spleen. Findings related to a  mass in the pancreatic tail and spleen have likely progressed. Linear areas in the spleen may represent tumor infiltration. Spleen laceration can have a similar appearance, however there is no intraperitoneal hemorrhage to support this diagnosis. Bibasilar atelectasis versus airspace disease. Small pleural effusions. 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 Possible inflammatory change of the pancreas without evidence of necrosis or hemorrhage. Correlation with lipase is recommended. Wall thickening of the colon has improved, but it is of lower density today suggesting inflammation. Ischemia and tumor infiltration can have a similar appearance. There is no pneumatosis. The mass is adjacent to the descending colon and greater curvature  of the stomach are described above. They have slightly worsened. Electronically Signed   By: Marybelle Killings M.D.   On: 07/10/2017 13:46   Ct Image Guided Drainage By Percutaneous Catheter  Result Date: 07/11/2017 INDICATION: PANCREATIC TAIL ABSCESS EXAM: CT GUIDED DRAINAGE OF THE PANCREATIC TAIL ABSCESS MEDICATIONS: The patient is currently admitted to the hospital and receiving intravenous antibiotics. The antibiotics were administered within an appropriate time frame prior to the initiation of the procedure. ANESTHESIA/SEDATION: 1.0 mg IV Versed 50 mcg IV Fentanyl Moderate Sedation Time:  10 minutes The patient was continuously monitored during the procedure by the interventional radiology nurse under my direct supervision. COMPLICATIONS: None immediate. TECHNIQUE: Informed written consent was obtained from the patient after a thorough discussion of the procedural risks, benefits and alternatives. All questions were addressed. Maximal Sterile Barrier Technique was utilized including caps, mask, sterile gowns, sterile gloves, sterile drape, hand hygiene and skin antiseptic. A timeout was performed prior to the initiation of the procedure. PROCEDURE: Previous imaging reviewed. Patient positioned left anterior oblique. Initial noncontrast imaging performed through the abdomen. The pancreatic tail abscess was difficult to visualize without contrast. Therefore 75 cc contrast administered IV. Repeat imaging performed with contrast. The pancreatic tail abscess was localized. Overlying skin marked. Under sterile conditions and local anesthesia, an 18 gauge 10 cm access needle was advanced from a lower intercostal lateral approach just inferior to the spleen into the abscess. Needle position confirmed with CT. Syringe aspiration yielded purulent fluid. Sample sent for culture. Amplatz guidewire inserted followed by tract dilatation to insert a 10 [place] drain. Drain catheter position confirmed with CT. Catheter secured  with a Prolene suture and connected to external gravity drainage. Sterile dressing applied. No immediate complication. Patient tolerated the procedure well. FINDINGS: CT imaging confirms needle placed within the inferior pancreatic tail abscess for drain insertion IMPRESSION: Successful CT-guided pancreatic tail abscess drain placement Electronically Signed   By: Jerilynn Mages.  Shick M.D.   On: 07/11/2017 11:12    Assessment/Plan: Pancreatic tail abscess S/p perc drain 7/15 Check cultures/cyto Drain care IR following    LOS: 2 days   I spent a total of 15 minutes in face to face in clinical consultation, greater than 50% of which was counseling/coordinating care for pancreatic abscess drain  Ascencion Dike PA-C 07/12/2017 10:21 AM

## 2017-07-12 NOTE — Progress Notes (Addendum)
PROGRESS NOTE [facility]   VINAY ERTL   NKN:[id number] [name]: Jul 10, 1961  [name]: 07/09/2017 PCP: [name], MD   Brief Narrative:  56 year old male with medical history significant for colon cancer on chemotherapy, DVT on Xarelto, anemia, depression presented with abdominal pain and diarrhea for 3 days prior to admission. Patient reported symptoms started after chemotherapy which was done 07/08/17. Patient was admitted for possible colitis and was started on Cipro and Flagyl. Despite patient being nothing by mouth and being treated with antibiotic patient continued with abdominal pain, a CT of the abdomen was done which showed a new abscess measuring 3.8 x 4.6 cm inferior to the pancreatic tail and spleen. There is also some inflammatory changes around the pancreas may be suggesting pancreatitis. Surgical team and medical oncology has been consulted.  Subjective: Patient seen and examined, history status post CT-guided drain of pancreatic tail abscess. Doing well this morning, denies any pain. Asking for food. Denies nausea, vomiting and diarrhea. No other concerns, patient remains afebrile.  Assessment & Plan: Sepsis - secondary to Pancreatic tail abscess and ? Colitis  Patient met sepsis criteria on initial presentation with fever leukocytosis and tachycardia - sepsis physiology has resolved.  Initially with mild hypotension which responded well to IV fluid resuscitation. Patient initially treated for colitis, was started on Cipro and Flagyl - switched to Zosyn 07/10/17 Status post CT-guided drain on 07/11/2017. Discussed with ID will de-escalate abx treatment - will stop zosyn, will start Rocephin and oral Flagyl.  Repeat CT per surgery recommendations  Lipase and Amylase normal Advance diet as tolerated Continue IVF for now can DC when patient tolerating oral diet Follow-up cultures - blood cultures so far negative, body fluid cultures pending Continue supportive treatment    Adenocarcinoma of the ascending colon Currently on chemotherapy, last dose 07/08/2017. Per oncology   DVT of right lower extremity Xarelto was held for procedure We'll defer to oncology or IR for when to safely resume oral anticoagulation.  Hyponatremia - resolved  Likely secondary to GI losses Patient remains on IV fluids   Normocytic anemia  Likely hemodilution from aggressive IVF  Hemoglobin stable No signs of overt bleeding  DVT prophylaxis: SCDs, Xarelto on  hold Code Status: Full code Family Communication: None at bedside Disposition Plan: Can transfer to med surg. Plan for possible procedure in AM by IR   Consultants:   Gen Surgery   IR   Oncology   Procedures:   None  Antimicrobials: Anti-infectives    Start     Dose/Rate Route Frequency Ordered Stop   07/10/17 1800  piperacillin-tazobactam (ZOSYN) IVPB 3.375 g     3.375 g 12.5 mL/hr over 240 Minutes Intravenous Every 8 hours 07/10/17 1626     07/10/17 0045  metroNIDAZOLE (FLAGYL) IVPB 500 mg  Status:  Discontinued     500 mg 100 mL/hr over 60 Minutes Intravenous Every 8 hours 07/10/17 0039 07/10/17 1607   07/10/17 0045  ciprofloxacin (CIPRO) IVPB 400 mg  Status:  Discontinued     400 mg 200 mL/hr over 60 Minutes Intravenous Every 12 hours 07/10/17 0042 07/10/17 1607      Objective: Vitals:   07/11/17 1500 07/11/17 1558 07/11/17 2037 07/12/17 0435  BP: 122/78  112/81 110/68  Pulse: 62  65 92  Resp: _0 Temp:  97.9 F (36.6 C) 98 F (36.7 C) 98.7 F (37.1 C)  TempSrc:  Oral Oral Oral  SpO2: 96% 100% 100% 99%  Weight:  60  kg (132 lb 4.4 oz)    Height:  _0  (1.676 m)      Intake/Output Summary (Last 24 hours) at 07/12/17 1129 Last data filed at 07/12/17 0710  Gross per 24 hour  Intake          2185.42 ml  Output             1650 ml  Net           535.42 ml   Filed Weights   07/10/17 0200 07/11/17 1558  Weight: 57.5 kg (126 lb 12.2 oz) 60 kg (132 lb 4.4 oz)     Examination:  General: Pt is alert, awake, not in acute distress Cardiovascular: RRR, S1/S2 +, no rubs, no gallops Respiratory: CTA bilaterally, no wheezing, no rhonchi Abdominal: Soft, Mildly distended, drain in place serosanguineous. Not tender Extremities: no edema, no cyanosis  Data Reviewed: I have personally reviewed following labs and imaging studies  CBC:  Recent Labs Lab 07/06/17 0823 07/09/17 2230 07/10/17 0321 07/11/17 0340 07/12/17 0625  WBC 12.3* 14.8* 11.1* 8.1 7.6  NEUTROABS 9.3* 12.1*  --  5.8 5.5  HGB 12.8* 12.3* 11.0* 10.7* 10.7*  HCT 39.2 34.9* 32.5* 31.6* 31.4*  MCV 88.2 83.7 83.5 86.1 85.1  PLT 84* 118* 99* 129* 100   Basic Metabolic Panel:  Recent Labs Lab 07/06/17 0823 07/09/17 2230 07/10/17 0321 07/10/17 1645 07/11/17 0340 07/12/17 0625  NA 134* 128* 132*  --  134* 135  K 3.6 3.6 3.1*  --  3.8 3.8  CL  --  98* 103  --  109 109  CO2 23 21* 21*  --  20* 18*  GLUCOSE 102 114* 112*  --  89 60*  BUN 5.1* 10 9  --  7 7  CREATININE 0.8 0.97 0.83  --  0.76 0.74  CALCIUM 9.3 8.3* 8.0*  --  8.0* 8.2*  MG 1.3*  --   --  1.3* 1.6* 1.4*   GFR: Estimated Creatinine Clearance: 85.4 mL/min (by C-G formula based on SCr of 0.74 mg/dL). Liver Function Tests:  Recent Labs Lab 07/06/17 0823 07/09/17 2230 07/10/17 0321 07/11/17 0340  AST _1 ALT 7 12* 9* 10*  ALKPHOS 100 58 51 42  BILITOT 0.59 1.5* 1.0 1.0  PROT 8.0 7.9 6.9 6.4*  ALBUMIN 2.9* 2.9* 2.5* 2.2*    Recent Labs Lab 07/10/17 1645 07/11/17 0340  LIPASE 18 13  AMYLASE 46  --    Coagulation Profile:  Recent Labs Lab 07/11/17 0340  INR 1.27   CBG:  Recent Labs Lab 07/10/17 0843 07/11/17 0749 07/12/17 0800 07/12/17 0849  GLUCAP 94 80 55* 102*   Sepsis Labs:  Recent Labs Lab 07/10/17 0321 07/10/17 0624  PROCALCITON 0.72  --   LATICACIDVEN 0.7 0.6    Recent Results (from the past 240 hour(s))  MRSA PCR Screening     Status: None   Collection Time:  07/10/17  2:13 AM  Result Value Ref Range Status   MRSA by PCR NEGATIVE NEGATIVE Final    Comment:        The GeneXpert MRSA Assay (FDA approved for NASAL specimens only), is one component of a comprehensive MRSA colonization surveillance program. It is not intended to diagnose MRSA infection nor to guide or monitor treatment for MRSA infections.   Culture, blood (x 2)     Status: None (Preliminary result)   Collection Time: 07/10/17  4:12 AM  Result Value Ref Range Status  Specimen Description BLOOD LEFT ANTECUBITAL  Final   Special Requests   Final    BOTTLES DRAWN AEROBIC AND ANAEROBIC Blood Culture adequate volume   Culture   Final    NO GROWTH 1 DAY Performed at [facility], [address], [place], [place] [address]    Report Status PENDING  Incomplete  Culture, blood (x 2)     Status: None (Preliminary result)   Collection Time: 07/10/17  4:18 AM  Result Value Ref Range Status   Specimen Description BLOOD BLOOD LEFT HAND  Final   Special Requests IN PEDIATRIC BOTTLE Blood Culture adequate volume  Final   Culture   Final    NO GROWTH 1 DAY Performed at [facility], [place] [address] [place], [place] [address]    Report Status PENDING  Incomplete  C difficile quick scan w PCR reflex     Status: None   Collection Time: 07/11/17  7:50 AM  Result Value Ref Range Status   C Diff antigen NEGATIVE NEGATIVE Final   C Diff toxin NEGATIVE NEGATIVE Final   C Diff interpretation No C. difficile detected.  Final  Aerobic/Anaerobic Culture (surgical/deep wound)     Status: Abnormal (Preliminary result)   Collection Time: 07/11/17 11:07 AM  Result Value Ref Range Status   Specimen Description ABSCESS PANCREAS  Final   Special Requests Normal  Final   Gram Stain   Final    ABUNDANT WBC PRESENT, PREDOMINANTLY PMN MODERATE GRAM NEGATIVE RODS MODERATE GRAM POSITIVE RODS FEW GRAM POSITIVE COCCI IN PAIRS Performed at [facility], [place] [address],  [place], [place] [address]    Culture MULTIPLE ORGANISMS PRESENT, NONE PREDOMINANT (A)  Final   Report Status PENDING  Incomplete     Radiology Studies: Ct Abdomen Pelvis W Contrast  Result Date: 07/10/2017 CLINICAL DATA:  Colitis EXAM: CT ABDOMEN AND PELVIS WITH CONTRAST TECHNIQUE: Multidetector CT imaging of the abdomen and pelvis was performed using the standard protocol following bolus administration of intravenous contrast. CONTRAST:  154m ISOVUE-300 IOPAMIDOL (ISOVUE-300) INJECTION 61% COMPARISON:  07/05/2017 FINDINGS: Lower chest: Bibasilar dependent atelectasis versus airspace disease. Small pleural effusions. Hepatobiliary: Diffuse hepatic steatosis. There are varices anterior to the liver. Gallbladder is unremarkable. Pancreas: The mass invading the tail of the pancreas and hilum of the spleen measures 5.8 x 3.8 cm previously measured 5.7 x 4.0 cm. There is fluid density surrounding the body and tail of the pancreas suggesting an inflammatory process of the pancreatic parenchyma. There is no pancreatic hemorrhage or necrosis. Spleen: The mass extending from the tail of the pancreas invades the spleen. There are scattered areas of low density within the spleen that have increased which may represent worsening tumor infiltration. Spleen laceration can have a similar appearance but there is no obvious intraperitoneal hemorrhage. Adrenals/Urinary Tract: Kidneys are unremarkable. Adrenal glands are unremarkable. Bladder is unremarkable. Stomach/Bowel: Wall thickening of the distal transverse colon has improved. There is persistent wall thickening of the colon at the splenic flexure and descending colon. The wall thickening is more low density suggesting an inflammatory process. There is a mass medial to the descending colon on image 33 which measures 2.1 x 2.6 cm and previously measured 1.9 x 2.5 cm. It is not significantly changed. There is a mass adjacent to the greater curvature of the stomach on image  24 measuring 2.2 x 2.4 cm and previously measuring 1.9 x 1.9 cm. It is larger. There is now all large fluid and  gas containing collection just inferior to the mass invading the pancreatic tail and splenic flexure worrisome for abscess. It measures 3.8 x 4.6 cm on image 24. The mass is surrounded by bowel loops and spleen. Percutaneous drainage would be difficult. Vascular/Lymphatic: No abnormal retroperitoneal adenopathy. No evidence of aortic aneurysm. Atherosclerotic calcifications of the aorta are noted. Reproductive: Prostate is upper normal in size. Other: No abdominal wall hernia. Musculoskeletal: Degenerative disc disease in the lumbar spine. No vertebral compression. IMPRESSION: The salient finding is a new abscess measuring 3.8 x 4.6 cm inferior to the pancreatic tail and spleen. Findings related to a mass in the pancreatic tail and spleen have likely progressed. Linear areas in the spleen may represent tumor infiltration. Spleen laceration can have a similar appearance, however there is no intraperitoneal hemorrhage to support this diagnosis. Bibasilar atelectasis versus airspace disease. Small pleural effusions. 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 Possible inflammatory change of the pancreas without evidence of necrosis or hemorrhage. Correlation with lipase is recommended. Wall thickening of the colon has improved, but it is of lower density today suggesting inflammation. Ischemia and tumor infiltration can have a similar appearance. There is no pneumatosis. The mass is adjacent to the descending colon and greater curvature of the stomach are described above. They have slightly worsened. Electronically Signed   By: Marybelle Killings M.D.   On: 07/10/2017 13:46   Ct Image Guided Drainage By Percutaneous Catheter  Result Date: 07/11/2017 INDICATION: PANCREATIC TAIL ABSCESS EXAM: CT GUIDED DRAINAGE OF THE PANCREATIC TAIL ABSCESS MEDICATIONS: The patient is currently admitted to the hospital and receiving  intravenous antibiotics. The antibiotics were administered within an appropriate time frame prior to the initiation of the procedure. ANESTHESIA/SEDATION: 1.0 mg IV Versed 50 mcg IV Fentanyl Moderate Sedation Time:  10 minutes The patient was continuously monitored during the procedure by the interventional radiology nurse under my direct supervision. COMPLICATIONS: None immediate. TECHNIQUE: Informed written consent was obtained from the patient after a thorough discussion of the procedural risks, benefits and alternatives. All questions were addressed. Maximal Sterile Barrier Technique was utilized including caps, mask, sterile gowns, sterile gloves, sterile drape, hand hygiene and skin antiseptic. A timeout was performed prior to the initiation of the procedure. PROCEDURE: Previous imaging reviewed. Patient positioned left anterior oblique. Initial noncontrast imaging performed through the abdomen. The pancreatic tail abscess was difficult to visualize without contrast. Therefore 75 cc contrast administered IV. Repeat imaging performed with contrast. The pancreatic tail abscess was localized. Overlying skin marked. Under sterile conditions and local anesthesia, an 18 gauge 10 cm access needle was advanced from a lower intercostal lateral approach just inferior to the spleen into the abscess. Needle position confirmed with CT. Syringe aspiration yielded purulent fluid. Sample sent for culture. Amplatz guidewire inserted followed by tract dilatation to insert a 10 [place] drain. Drain catheter position confirmed with CT. Catheter secured with a Prolene suture and connected to external gravity drainage. Sterile dressing applied. No immediate complication. Patient tolerated the procedure well. FINDINGS: CT imaging confirms needle placed within the inferior pancreatic tail abscess for drain insertion IMPRESSION: Successful CT-guided pancreatic tail abscess drain placement Electronically Signed   By: Jerilynn Mages.  Shick M.D.    On: 07/11/2017 11:12     Scheduled Meds: . Clindamycin Phos-Benzoyl Perox  1 application Topical BID  . dextrose  25 mL Intravenous Once  . hydrocortisone cream   Topical BID  . lip balm  1 application Topical BID  . mouth rinse  15 mL Mouth Rinse BID  .  mirtazapine  15 mg Oral QHS  . pantoprazole (PROTONIX) IV  40 mg Intravenous Q12H  . saccharomyces boulardii  250 mg Oral BID  . sodium chloride flush  3 mL Intravenous Q12H  . sodium chloride flush  5 mL Intravenous Q8H   Continuous Infusions: . sodium chloride 100 mL/hr at 07/12/17 1042  . lactated ringers    . methocarbamol (ROBAXIN)  IV    . piperacillin-tazobactam (ZOSYN)  IV 3.375 g (07/12/17 0810)     LOS: 2 days   Time spent: Total of 15 minutes spent with pt, greater than 50% of which was spent in discussion of  treatment, counseling and coordination of care   Chipper Oman, MD Pager: Text Page via [email or web address]  [phone]  If 7PM-7AM, please contact night-coverage [email or web address] Password [facility] 07/12/2017, 11:29 AM

## 2017-07-12 NOTE — Progress Notes (Signed)
[facility] Progress Note     Subjective: CC:  Denies abdominal pain , just mild soreness around drain site. Denies fever, chills, nausea, or vomiting. Last BM yesterday and was formed/brown. Tolerated clears at lunch.   Afebrile, VSS Objective: Vital signs in last 24 hours: Temp:  [97.9 F (36.6 C)-98.7 F (37.1 C)] 98.7 F (37.1 C) (07/16 0435) Pulse Rate:  [62-92] 92 (07/16 0435) Resp:  [15-18] 18 (07/16 0435) BP: (110-122)/(68-81) 110/68 (07/16 0435) SpO2:  [96 %-100 %] 99 % (07/16 0435) Weight:  [60 kg (132 lb 4.4 oz)] 60 kg (132 lb 4.4 oz) (07/15 1558) Last BM Date: 07/11/17  Intake/Output from previous day: 07/15 0701 - 07/16 0700 In: 995.4 [I.V.:975.4] Out: 9833 [Urine:1325; Drains:20] Intake/Output this shift: Total I/O In: 1210 [I.V.:1200; Other:10] Out: 315 [Urine:300; Drains:15]  PE: Gen:  Alert, NAD, pleasant HEENT: pupil equal, no scleral icterus, EOM's in tact Card:  Regular rate and rhythm, pedal pulses 2+ BL Pulm:  Normal effort, clear to auscultation bilaterally Abd: Soft, mild TTP LLQ, mild distention, bowel sounds present in all 4 quadrants  JP drain: 15 cc output recorded so far today, SS Skin: warm and dry, no rashes  Psych: A&Ox3   Lab Results:   Recent Labs  07/11/17 0340 07/12/17 0625  WBC 8.1 7.6  HGB 10.7* 10.7*  HCT 31.6* 31.4*  PLT 129* 152   BMET  Recent Labs  07/11/17 0340 07/12/17 0625  NA 134* 135  K 3.8 3.8  CL 109 109  CO2 20* 18*  GLUCOSE 89 60*  BUN 7 7  CREATININE 0.76 0.74  CALCIUM 8.0* 8.2*   PT/INR  Recent Labs  07/11/17 0340  LABPROT 16.0*  INR 1.27   CMP     Component Value Date/Time   NA 135 07/12/2017 0625   NA 134 (L) 07/06/2017 0823   K 3.8 07/12/2017 0625   K 3.6 07/06/2017 0823   CL 109 07/12/2017 0625   CO2 18 (L) 07/12/2017 0625   CO2 23 07/06/2017 0823   GLUCOSE 60 (L) 07/12/2017 0625   GLUCOSE 102 07/06/2017 0823   BUN 7 07/12/2017 0625   BUN 5.1 (L) 07/06/2017  0823   CREATININE 0.74 07/12/2017 0625   CREATININE 0.8 07/06/2017 0823   CALCIUM 8.2 (L) 07/12/2017 0625   CALCIUM 9.3 07/06/2017 0823   PROT 6.4 (L) 07/11/2017 0340   PROT 8.0 07/06/2017 0823   ALBUMIN 2.2 (L) 07/11/2017 0340   ALBUMIN 2.9 (L) 07/06/2017 0823   AST 16 07/11/2017 0340   AST 22 07/06/2017 0823   ALT 10 (L) 07/11/2017 0340   ALT 7 07/06/2017 0823   ALKPHOS 42 07/11/2017 0340   ALKPHOS 100 07/06/2017 0823   BILITOT 1.0 07/11/2017 0340   BILITOT 0.59 07/06/2017 0823   GFRNONAA >60 07/12/2017 0625   GFRAA >60 07/12/2017 0625   Lipase     Component Value Date/Time   LIPASE 13 07/11/2017 0340       Studies/Results: Ct Image Guided Drainage By Percutaneous Catheter  Result Date: 07/11/2017 INDICATION: PANCREATIC TAIL ABSCESS EXAM: CT GUIDED DRAINAGE OF THE PANCREATIC TAIL ABSCESS MEDICATIONS: The patient is currently admitted to the hospital and receiving intravenous antibiotics. The antibiotics were administered within an appropriate time frame prior to the initiation of the procedure. ANESTHESIA/SEDATION: 1.0 mg IV Versed 50 mcg IV Fentanyl Moderate Sedation Time:  10 minutes The patient was continuously monitored during the procedure by the interventional radiology nurse under my direct supervision. COMPLICATIONS: None  immediate. TECHNIQUE: Informed written consent was obtained from the patient after a thorough discussion of the procedural risks, benefits and alternatives. All questions were addressed. Maximal Sterile Barrier Technique was utilized including caps, mask, sterile gowns, sterile gloves, sterile drape, hand hygiene and skin antiseptic. A timeout was performed prior to the initiation of the procedure. PROCEDURE: Previous imaging reviewed. Patient positioned left anterior oblique. Initial noncontrast imaging performed through the abdomen. The pancreatic tail abscess was difficult to visualize without contrast. Therefore 75 cc contrast administered IV. Repeat  imaging performed with contrast. The pancreatic tail abscess was localized. Overlying skin marked. Under sterile conditions and local anesthesia, an 18 gauge 10 cm access needle was advanced from a lower intercostal lateral approach just inferior to the spleen into the abscess. Needle position confirmed with CT. Syringe aspiration yielded purulent fluid. Sample sent for culture. Amplatz guidewire inserted followed by tract dilatation to insert a 10 [place] drain. Drain catheter position confirmed with CT. Catheter secured with a Prolene suture and connected to external gravity drainage. Sterile dressing applied. No immediate complication. Patient tolerated the procedure well. FINDINGS: CT imaging confirms needle placed within the inferior pancreatic tail abscess for drain insertion IMPRESSION: Successful CT-guided pancreatic tail abscess drain placement Electronically Signed   By: Jerilynn Mages.  Shick M.D.   On: 07/11/2017 11:12    Anti-infectives: Anti-infectives    Start     Dose/Rate Route Frequency Ordered Stop   07/12/17 1400  cefTRIAXone (ROCEPHIN) 2 g in dextrose 5 % 50 mL IVPB     2 g 100 mL/hr over 30 Minutes Intravenous Every 24 hours 07/12/17 1138     07/12/17 1400  metroNIDAZOLE (FLAGYL) tablet 500 mg     500 mg Oral Every 8 hours 07/12/17 1138     07/10/17 1800  piperacillin-tazobactam (ZOSYN) IVPB 3.375 g  Status:  Discontinued     3.375 g 12.5 mL/hr over 240 Minutes Intravenous Every 8 hours 07/10/17 1626 07/12/17 1138   07/10/17 0045  metroNIDAZOLE (FLAGYL) IVPB 500 mg  Status:  Discontinued     500 mg 100 mL/hr over 60 Minutes Intravenous Every 8 hours 07/10/17 0039 07/10/17 1607   07/10/17 0045  ciprofloxacin (CIPRO) IVPB 400 mg  Status:  Discontinued     400 mg 200 mL/hr over 60 Minutes Intravenous Every 12 hours 07/10/17 0042 07/10/17 1607     Assessment/Plan Pancreatic abscess in the setting of colon adenocarcinoma with extension to spleen/pancreas/LN - S/P CT GUIDED DRAINAGE  PANCREATIC TAIL ABSCESS 07/11/17 Dr. [name]  - CX pending, follow (GS w/ mult organisms present, none predominant) - sepsis resolved, continue IV abx per ID recommendations  - advance diet as tolerated  - hopeful patient will continue to improve with percutaneous drainage, concern for poor post-operative healing in the setting of chronic diease, malnutrition, and active chemotherapy.   Hx DVT RLE - Xarelto Normocytic anemia - hgb 10.7/hct 31.4  FEN - clear liquids ID - cipro 7/14,  Zosyn 7/14-7/16, Rocephin/Flagyl 7/16 >> VTE - SCD's, resume Xarelto per IR (ok from surgical perspective)    LOS: 2 days    Jeff [name] , [facility] 07/12/2017, 1:11 PM Pager: [phone] Consults: [phone] Mon-Fri 7:00 am-4:30 pm Sat-Sun 7:00 am-11:30 am

## 2017-07-12 NOTE — Progress Notes (Signed)
Hypoglycemic Event  CBG: 55  Treatment: D50 IV 25 mL  Symptoms: None  Follow-up CBG: [id number] CBG Result:102  Possible Reasons for Event: Inadequate meal intake  Comments/MD notified:    Nancy Marus [facility]

## 2017-07-12 NOTE — Progress Notes (Signed)
PHARMACY NOTE -  [facility] has been assisting with dosing of Xarelto for RLE DVT (May 2018). Dosage remains stable at 20 mg daily with supper, and need for further dosage adjustment appears unlikely at present.    Will sign off at this time.  Please reconsult if a change in clinical status warrants re-evaluation of dosage.  Reuel Boom, PharmD, BCPS Pager: [phone] 07/12/2017, 12:39 PM

## 2017-07-12 NOTE — Progress Notes (Signed)
Jeff [name]   [name]:April 11, 1961   QP#:[id number]   ZTI#:[id number]  ONCOLOGY FOLLOW UP NOTE   Subjective: Patient is well-known to me, under my care for his metastatic colon cancer, on palliative chemotherapy, last dose one week ago. He presented to emergency room with severe abdominal pain and fatigue, was found to have an abscess in the pancreatic tail, status post percutaneous drainage by IR, on broad antibiotics, he feels better now.    Objective:  Vitals:   07/12/17 0435 07/12/17 1345  BP: 110/68 101/70  Pulse: 92 92  Resp: 18 18  Temp: 98.7 F (37.1 C) 97.7 F (36.5 C)    Body mass index is 21.35 kg/m.  Intake/Output Summary (Last 24 hours) at 07/12/17 2011 Last data filed at 07/12/17 1600  Gross per 24 hour  Intake             2165 ml  Output             1525 ml  Net              640 ml     Sclerae unicteric  Oropharynx clear  No peripheral adenopathy  Lungs clear -- no rales or rhonchi  Heart regular rate and rhythm  Abdomen benign, mild tenderness at left side, (+) percutaneous drainage tube on left side.  MSK no focal spinal tenderness, no peripheral edema  Neuro nonfocal   CBG (last 3)   Recent Labs  07/11/17 0749 07/12/17 0800 07/12/17 0849  GLUCAP 80 55* 102*     Labs:  Urine Studies No results for input(s): UHGB, CRYS in the last 72 hours.  Invalid input(s): UACOL, UAPR, USPG, UPH, UTP, UGL, [place], UBIL, UNIT, UROB, [place], UEPI, UWBC, Junie Panning [place], [place], [place]  Basic Metabolic Panel:  Recent Labs Lab 07/06/17 [phone]  07/09/17 2230 07/10/17 0321 07/10/17 1645 07/11/17 0340 07/12/17 0625  NA 134*  --  128* 132*  --  134* 135  K 3.6  < > 3.6 3.1*  --  3.8 3.8  CL  --   --  98* 103  --  109 109  CO2 23  --  21* 21*  --  20* 18*  GLUCOSE 102  --  114* 112*  --  89 60*  BUN 5.1*  --  10 9  --  7 7  CREATININE 0.8  --  0.97 0.83  --  0.76 0.74  CALCIUM 9.3  --  8.3* 8.0*  --  8.0* 8.2*  MG 1.3*  --   --   --  1.3* 1.6* 1.4*  < > = values  in this interval not displayed. GFR Estimated Creatinine Clearance: 85.4 mL/min (by C-G formula based on SCr of 0.74 mg/dL). Liver Function Tests:  Recent Labs Lab 07/06/17 0823 07/09/17 2230 07/10/17 0321 07/11/17 0340  AST 22 28 18 16   ALT 7 12* 9* 10*  ALKPHOS 100 58 51 42  BILITOT 0.59 1.5* 1.0 1.0  PROT 8.0 7.9 6.9 6.4*  ALBUMIN 2.9* 2.9* 2.5* 2.2*    Recent Labs Lab 07/10/17 1645 07/11/17 0340  LIPASE 18 13  AMYLASE 46  --    No results for input(s): AMMONIA in the last 168 hours. Coagulation profile  Recent Labs Lab 07/11/17 0340  INR 1.27    CBC:  Recent Labs Lab 07/06/17 0823 07/09/17 2230 07/10/17 0321 07/11/17 0340 07/12/17 0625  WBC 12.3* 14.8* 11.1* 8.1 7.6  NEUTROABS 9.3* 12.1*  --  5.8 5.5  HGB  12.8* 12.3* 11.0* 10.7* 10.7*  HCT 39.2 34.9* 32.5* 31.6* 31.4*  MCV 88.2 83.7 83.5 86.1 85.1  PLT 84* 118* 99* 129* 152   Cardiac Enzymes: No results for input(s): CKTOTAL, CKMB, CKMBINDEX, TROPONINI in the last 168 hours. BNP: Invalid input(s): POCBNP CBG:  Recent Labs Lab 07/10/17 0843 07/11/17 0749 07/12/17 0800 07/12/17 0849  GLUCAP 94 80 55* 102*   D-Dimer No results for input(s): DDIMER in the last 72 hours. Hgb A1c No results for input(s): HGBA1C in the last 72 hours. Lipid Profile No results for input(s): CHOL, HDL, LDLCALC, TRIG, CHOLHDL, LDLDIRECT in the last 72 hours. Thyroid function studies No results for input(s): TSH, T4TOTAL, T3FREE, THYROIDAB in the last 72 hours.  Invalid input(s): FREET3 Anemia work up No results for input(s): VITAMINB12, FOLATE, FERRITIN, TIBC, IRON, RETICCTPCT in the last 72 hours. Microbiology Recent Results (from the past 240 hour(s))  MRSA PCR Screening     Status: None   Collection Time: 07/10/17  2:13 AM  Result Value Ref Range Status   MRSA by PCR NEGATIVE NEGATIVE Final    Comment:        The GeneXpert MRSA Assay (FDA approved for NASAL specimens only), is one component of  a comprehensive MRSA colonization surveillance program. It is not intended to diagnose MRSA infection nor to guide or monitor treatment for MRSA infections.   Culture, blood (x 2)     Status: None (Preliminary result)   Collection Time: 07/10/17  4:12 AM  Result Value Ref Range Status   Specimen Description BLOOD LEFT ANTECUBITAL  Final   Special Requests   Final    BOTTLES DRAWN AEROBIC AND ANAEROBIC Blood Culture adequate volume   Culture   Final    NO GROWTH 2 DAYS Performed at [facility], [address] [place], [place] [address]    Report Status PENDING  Incomplete  Culture, blood (x 2)     Status: None (Preliminary result)   Collection Time: 07/10/17  4:18 AM  Result Value Ref Range Status   Specimen Description BLOOD BLOOD LEFT HAND  Final   Special Requests IN PEDIATRIC BOTTLE Blood Culture adequate volume  Final   Culture   Final    NO GROWTH 2 DAYS Performed at [facility], [place] [address] [place], [place] [address]    Report Status PENDING  Incomplete  C difficile quick scan w PCR reflex     Status: None   Collection Time: 07/11/17  7:50 AM  Result Value Ref Range Status   C Diff antigen NEGATIVE NEGATIVE Final   C Diff toxin NEGATIVE NEGATIVE Final   C Diff interpretation No C. difficile detected.  Final  Aerobic/Anaerobic Culture (surgical/deep wound)     Status: Abnormal (Preliminary result)   Collection Time: 07/11/17 11:07 AM  Result Value Ref Range Status   Specimen Description ABSCESS PANCREAS  Final   Special Requests Normal  Final   Gram Stain   Final    ABUNDANT WBC PRESENT, PREDOMINANTLY PMN MODERATE GRAM NEGATIVE RODS MODERATE GRAM POSITIVE RODS FEW GRAM POSITIVE COCCI IN PAIRS Performed at [facility], [place] [address] [place], [place] [address]    Culture MULTIPLE ORGANISMS PRESENT, NONE PREDOMINANT (A)  Final   Report Status PENDING  Incomplete      Studies:  Ct Image Guided Drainage By Percutaneous  Catheter  Result Date: 07/11/2017 INDICATION: PANCREATIC TAIL ABSCESS EXAM: CT GUIDED DRAINAGE OF THE PANCREATIC TAIL ABSCESS MEDICATIONS: The  patient is currently admitted to the hospital and receiving intravenous antibiotics. The antibiotics were administered within an appropriate time frame prior to the initiation of the procedure. ANESTHESIA/SEDATION: 1.0 mg IV Versed 50 mcg IV Fentanyl Moderate Sedation Time:  10 minutes The patient was continuously monitored during the procedure by the interventional radiology nurse under my direct supervision. COMPLICATIONS: None immediate. TECHNIQUE: Informed written consent was obtained from the patient after a thorough discussion of the procedural risks, benefits and alternatives. All questions were addressed. Maximal Sterile Barrier Technique was utilized including caps, mask, sterile gowns, sterile gloves, sterile drape, hand hygiene and skin antiseptic. A timeout was performed prior to the initiation of the procedure. PROCEDURE: Previous imaging reviewed. Patient positioned left anterior oblique. Initial noncontrast imaging performed through the abdomen. The pancreatic tail abscess was difficult to visualize without contrast. Therefore 75 cc contrast administered IV. Repeat imaging performed with contrast. The pancreatic tail abscess was localized. Overlying skin marked. Under sterile conditions and local anesthesia, an 18 gauge 10 cm access needle was advanced from a lower intercostal lateral approach just inferior to the spleen into the abscess. Needle position confirmed with CT. Syringe aspiration yielded purulent fluid. Sample sent for culture. Amplatz guidewire inserted followed by tract dilatation to insert a 10 [place] drain. Drain catheter position confirmed with CT. Catheter secured with a Prolene suture and connected to external gravity drainage. Sterile dressing applied. No immediate complication. Patient tolerated the procedure well. FINDINGS: CT imaging  confirms needle placed within the inferior pancreatic tail abscess for drain insertion IMPRESSION: Successful CT-guided pancreatic tail abscess drain placement Electronically Signed   By: Jerilynn Mages.  Shick M.D.   On: 07/11/2017 11:12    Assessment: 56 y.o. with metastatic colon cancer, on palliative chemotherapy FOLFOX and panitumumab, last cycle on 07/06/2017, admitted for abscess in the pancreatic tail  1. Sepsis secondary to pancreatic tail abscess 2. Pancreatic tail abscess, status post drain, on broad antibiotics, culture showed mixed organisms, non-predominant 3. Metastatic adenocarcinoma of descending colon, with peritoneal metastasis, on palliative chemotherapy 4. History of DVT in right lower extremity, on several to  5. 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 Moderate protein calorie malnutrition  Plan: -I appreciate the excellent care from the hospitalist team -wait for final culture results -He will need drainage and antibiotics for a while, hold chemo for the next 3-4 weeks, until his infection completely resolved. -We'll continue Xarelto if no bleeding -His last chemo oxaliplatin was held, and he did not receive Neulasta, he may develop neutropenia late this week, will consider Granix if ANC<1.0 -I'll continue follow-up as needed when he is in the hospital, and plan to see him back one week after his discharge.   [name], MD 07/12/2017  8:11 PM

## 2017-07-13 ENCOUNTER — Telehealth: Payer: Self-pay

## 2017-07-13 LAB — BASIC METABOLIC PANEL
ANION GAP: 6 (ref 5–15)
CHLORIDE: 107 mmol/L (ref 101–111)
CO2: 23 mmol/L (ref 22–32)
Calcium: 8.3 mg/dL — ABNORMAL LOW (ref 8.9–10.3)
Creatinine, Ser: 0.6 mg/dL — ABNORMAL LOW (ref 0.61–1.24)
GFR calc Af Amer: >60 mL/min (ref >60)
GFR calc non Af Amer: >60 mL/min (ref >60)
GLUCOSE: 100 mg/dL — AB (ref 65–99)
POTASSIUM: 3.4 mmol/L — AB (ref 3.5–5.1)
Sodium: 136 mmol/L (ref 135–145)

## 2017-07-13 LAB — MAGNESIUM: Magnesium: 1.4 mg/dL — ABNORMAL LOW (ref 1.7–2.4)

## 2017-07-13 LAB — CEA: CEA: 4.1 ng/mL (ref 0.0–4.7)

## 2017-07-13 LAB — GLUCOSE, CAPILLARY: Glucose-Capillary: 106 mg/dL — ABNORMAL HIGH (ref 65–99)

## 2017-07-13 MED ORDER — POTASSIUM CHLORIDE CRYS ER 20 MEQ PO TBCR
40.0000 meq | EXTENDED_RELEASE_TABLET | ORAL | Status: AC
  Administered 2017-07-13 (×2): 40 meq via ORAL
  Filled 2017-07-13 (×2): qty 2

## 2017-07-13 MED ORDER — PANTOPRAZOLE SODIUM 40 MG PO TBEC
40.0000 mg | DELAYED_RELEASE_TABLET | Freq: Two times a day (BID) | ORAL | Status: DC
  Administered 2017-07-13 – 2017-07-14 (×2): 40 mg via ORAL
  Filled 2017-07-13 (×2): qty 1

## 2017-07-13 MED ORDER — MAGNESIUM SULFATE 2 GM/50ML IV SOLN
2.0000 g | Freq: Once | INTRAVENOUS | Status: AC
  Administered 2017-07-13: 2 g via INTRAVENOUS
  Filled 2017-07-13: qty 50

## 2017-07-13 MED ORDER — DIPHENHYDRAMINE HCL 12.5 MG/5ML PO ELIX: 12.5000 mg | ORAL_SOLUTION | Freq: Four times a day (QID) | ORAL | Status: DC | PRN

## 2017-07-13 NOTE — Congregational Nurse Program (Signed)
Congregational Nurse Program Note  Date of Encounter: 07/08/2017  Past Medical History: Past Medical History:  Diagnosis Date  . Acute deep vein thrombosis (DVT) of right lower extremity ([place]) 02/18/2017  . 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 Microcytic anemia    /notes 01/11/2017    Encounter Details:     CNP Questionnaire - 07/13/17 1644      Patient Demographics   Is this a new or existing patient? Existing   Patient is considered a/an Not Applicable   Race African-American/Black     Patient Assistance   Location of Patient Assistance [facility] patient   Patient's financial/insurance status Medicaid   Uninsured Patient ([facility]) No   Patient referred to apply for the following financial assistance Not Applicable   Food insecurities addressed Not Applicable   Transportation assistance No   Assistance securing medications No   Educational health offerings Chronic disease     Encounter Details   Primary purpose of visit Education/Health Concerns;Spiritual Care/Support Visit   Was an Emergency Department visit averted? Not Applicable   Does patient have a medical provider? Yes   Patient referred to Clinic;Follow up with established PCP   Was a mental health screening completed? (GAINS tool) No   Does patient have dental issues? No   Does patient have vision issues? No   Does your patient have an abnormal blood pressure today? No   Since previous encounter, have you referred patient for abnormal blood pressure that resulted in a new diagnosis or medication change? No   Does your patient have an abnormal blood glucose today? No   Since previous encounter, have you referred patient for abnormal blood glucose that resulted in a new diagnosis or medication change? No   Was there a life-saving intervention made? No    Joint [facility] team visit today . Daughter picked up her dad from a friends house after he left the cancer center today to have  his pump removed ,stated his side was hurting ,felt it may be the way he laid on side where his pump is located . Just doesn't feel good . Came into his room and laid directly down ,some communication but definitely doesn't feel well. His daughter  States he was a little dehydrated and was given fluids at the hospital today . Daughter had stopped to the store to pick up food items for him . Nurse counseled if he didn't feel better to let his him know  And client understands along with his daughter. Daughter very attentive to him.  If symptoms dont go away call MD but felt he just wanted to rest . States he got a good  Report the tumor in his colon has shrink and not spread per their report  Understands  There is no cure.  Counseled to drink ensure if he cant eat or has no appetite. Still waiting on word on apartment house and praying he gets that unit. SW and daughter still looking for other options as they are limited .Client was seen at [facility] office to see if he could get a letter stating the increase in his monthly check but until he actually get lease signed and has a place to live they will not  Increase his monthly pay or give him a letter stating the increase. The property manager had wanted a letter saying his income would  Go up that was the reason for the SS visit. Plan now to get the  unit ,sign the lease take it to [facility] and his monthly income will go up and that will give him a reasonable amount of money to live off of knowing his rent will be around $390 per month . He wil then only have to pay lights ,H20 is included. Client allowed to rest and reminder of visit was conducted with daughter . Plan to ask for extension since we are close to getting housing and client remains in treatment at cancer center.  Visit arranged for next week

## 2017-07-13 NOTE — Telephone Encounter (Signed)
[facility] team unaware until today client had been hospitalized . SW states daughter texted her today to alert her and she called [facility] nurse . Team will check on client and nurse will alert  [facility] administrative  staff of  situation  And admission. Plans will be for team to check on client and make plans for client from their.

## 2017-07-13 NOTE — Progress Notes (Signed)
Referring Physician(s): Dr. Patrecia Pour  Supervising Physician: Marybelle Killings  Patient Status:  [facility] - In-pt  Chief Complaint:  Pancreatic abscess drain  Subjective: "I'm eating more.  I want to get home.  I hope I can go home tomorrow."  Allergies: Patient has no known allergies.  Medications: Prior to Admission medications   Medication Sig Start Date End Date Taking? Authorizing Provider  clindamycin (CLINDAGEL) 1 % gel Apply topically 2 (two) times daily. 04/12/17  Yes [name], MD  Clindamycin Phos-Benzoyl Perox gel Apply 1 application topically 2 (two) times daily. 04/14/17  Yes [name], MD  ferrous sulfate 325 (65 FE) MG tablet Take 1 tablet (325 mg total) by mouth 2 (two) times daily with a meal. 03/29/17  Yes Curcio, Roselie Awkward, NP  hydrocortisone 2.5 % cream Apply topically 2 (two) times daily. 05/25/17  Yes [name], MD  lidocaine-prilocaine (EMLA) cream Apply to affected area once 01/27/17  Yes [name], MD  loperamide (IMODIUM A-D) 2 MG tablet Take 1 tablet (2 mg total) by mouth 4 (four) times daily as needed for diarrhea or loose stools. 03/02/17  Yes [name], MD  mirtazapine (REMERON) 15 MG tablet Take 1 tablet (15 mg total) by mouth at bedtime. 06/22/17  Yes Causey, Charlestine Massed, NP  ondansetron (ZOFRAN) 8 MG tablet Take 1 tablet (8 mg total) by mouth 2 (two) times daily as needed for refractory nausea / vomiting. Start on day 3 after chemotherapy. 05/25/17  Yes [name], MD  Oxycodone HCl 10 MG TABS Take 1 tablet (10 mg total) by mouth every 6 (six) hours as needed. Patient taking differently: Take 10 mg by mouth every 6 (six) hours as needed (pain).  06/22/17 07/22/17 Yes Causey, Charlestine Massed, NP  prochlorperazine (COMPAZINE) 10 MG tablet Take 1 tablet (10 mg total) by mouth every 6 (six) hours as needed (Nausea or vomiting). 05/25/17  Yes [name], MD  rivaroxaban (XARELTO) 20 MG TABS tablet Take 1 tablet (20 mg total) by mouth daily with supper. 05/25/17  Yes  [name], MD     Vital Signs: BP 120/87 (BP Location: Left Arm)   Pulse 78   Temp 98.3 F (36.8 C) (Oral)   Resp 18   Ht 5\' 6"  (1.676 m)   Wt 134 lb 7.7 oz (61 kg)   SpO2 100%   BMI 21.71 kg/m   Physical Exam  Nursing note and vitals reviewed.  NAD, alert Abdomen:  Drain in place.  Insertion site without erythema or warmth.  Some drainage on dressing. Bloody output in bulb.   Imaging: Ct Abdomen Pelvis W Contrast  Result Date: 07/10/2017 CLINICAL DATA:  Colitis EXAM: CT ABDOMEN AND PELVIS WITH CONTRAST TECHNIQUE: Multidetector CT imaging of the abdomen and pelvis was performed using the standard protocol following bolus administration of intravenous contrast. CONTRAST:  179mL ISOVUE-300 IOPAMIDOL (ISOVUE-300) INJECTION 61% COMPARISON:  07/05/2017 FINDINGS: Lower chest: Bibasilar dependent atelectasis versus airspace disease. Small pleural effusions. Hepatobiliary: Diffuse hepatic steatosis. There are varices anterior to the liver. Gallbladder is unremarkable. Pancreas: The mass invading the tail of the pancreas and hilum of the spleen measures 5.8 x 3.8 cm previously measured 5.7 x 4.0 cm. There is fluid density surrounding the body and tail of the pancreas suggesting an inflammatory process of the pancreatic parenchyma. There is no pancreatic hemorrhage or necrosis. Spleen: The mass extending from the tail of the pancreas invades the spleen. There are scattered areas of low density within the spleen  that have increased which may represent worsening tumor infiltration. Spleen laceration can have a similar appearance but there is no obvious intraperitoneal hemorrhage. Adrenals/Urinary Tract: Kidneys are unremarkable. Adrenal glands are unremarkable. Bladder is unremarkable. Stomach/Bowel: Wall thickening of the distal transverse colon has improved. There is persistent wall thickening of the colon at the splenic flexure and descending colon. The wall thickening is more low density suggesting  an inflammatory process. There is a mass medial to the descending colon on image 33 which measures 2.1 x 2.6 cm and previously measured 1.9 x 2.5 cm. It is not significantly changed. There is a mass adjacent to the greater curvature of the stomach on image 24 measuring 2.2 x 2.4 cm and previously measuring 1.9 x 1.9 cm. It is larger. There is now all large fluid and gas containing collection just inferior to the mass invading the pancreatic tail and splenic flexure worrisome for abscess. It measures 3.8 x 4.6 cm on image 24. The mass is surrounded by bowel loops and spleen. Percutaneous drainage would be difficult. Vascular/Lymphatic: No abnormal retroperitoneal adenopathy. No evidence of aortic aneurysm. Atherosclerotic calcifications of the aorta are noted. Reproductive: Prostate is upper normal in size. Other: No abdominal wall hernia. Musculoskeletal: Degenerative disc disease in the lumbar spine. No vertebral compression. IMPRESSION: The salient finding is a new abscess measuring 3.8 x 4.6 cm inferior to the pancreatic tail and spleen. Findings related to a mass in the pancreatic tail and spleen have likely progressed. Linear areas in the spleen may represent tumor infiltration. Spleen laceration can have a similar appearance, however there is no intraperitoneal hemorrhage to support this diagnosis. Bibasilar atelectasis versus airspace disease. Small pleural effusions. 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 Possible inflammatory change of the pancreas without evidence of necrosis or hemorrhage. Correlation with lipase is recommended. Wall thickening of the colon has improved, but it is of lower density today suggesting inflammation. Ischemia and tumor infiltration can have a similar appearance. There is no pneumatosis. The mass is adjacent to the descending colon and greater curvature of the stomach are described above. They have slightly worsened. Electronically Signed   By: Marybelle Killings M.D.   On: 07/10/2017 13:46     Ct Image Guided Drainage By Percutaneous Catheter  Result Date: 07/11/2017 INDICATION: PANCREATIC TAIL ABSCESS EXAM: CT GUIDED DRAINAGE OF THE PANCREATIC TAIL ABSCESS MEDICATIONS: The patient is currently admitted to the hospital and receiving intravenous antibiotics. The antibiotics were administered within an appropriate time frame prior to the initiation of the procedure. ANESTHESIA/SEDATION: 1.0 mg IV Versed 50 mcg IV Fentanyl Moderate Sedation Time:  10 minutes The patient was continuously monitored during the procedure by the interventional radiology nurse under my direct supervision. COMPLICATIONS: None immediate. TECHNIQUE: Informed written consent was obtained from the patient after a thorough discussion of the procedural risks, benefits and alternatives. All questions were addressed. Maximal Sterile Barrier Technique was utilized including caps, mask, sterile gowns, sterile gloves, sterile drape, hand hygiene and skin antiseptic. A timeout was performed prior to the initiation of the procedure. PROCEDURE: Previous imaging reviewed. Patient positioned left anterior oblique. Initial noncontrast imaging performed through the abdomen. The pancreatic tail abscess was difficult to visualize without contrast. Therefore 75 cc contrast administered IV. Repeat imaging performed with contrast. The pancreatic tail abscess was localized. Overlying skin marked. Under sterile conditions and local anesthesia, an 18 gauge 10 cm access needle was advanced from a lower intercostal lateral approach just inferior to the spleen into the abscess. Needle position confirmed with  CT. Syringe aspiration yielded purulent fluid. Sample sent for culture. Amplatz guidewire inserted followed by tract dilatation to insert a 10 [place] drain. Drain catheter position confirmed with CT. Catheter secured with a Prolene suture and connected to external gravity drainage. Sterile dressing applied. No immediate complication. Patient  tolerated the procedure well. FINDINGS: CT imaging confirms needle placed within the inferior pancreatic tail abscess for drain insertion IMPRESSION: Successful CT-guided pancreatic tail abscess drain placement Electronically Signed   By: Jerilynn Mages.  Shick M.D.   On: 07/11/2017 11:12    Labs:  CBC:  Recent Labs  07/09/17 2230 07/10/17 0321 07/11/17 0340 07/12/17 0625  WBC 14.8* 11.1* 8.1 7.6  HGB 12.3* 11.0* 10.7* 10.7*  HCT 34.9* 32.5* 31.6* 31.4*  PLT 118* 99* 129* 152    COAGS:  Recent Labs  01/11/17 1009 01/29/17 1256 07/11/17 0340  INR 1.20 1.07 1.27  APTT 34 31  --     BMP:  Recent Labs  07/10/17 0321 07/11/17 0340 07/12/17 0625 07/13/17 0559  NA 132* 134* 135 136  K 3.1* 3.8 3.8 3.4*  CL 103 109 109 107  CO2 21* 20* 18* 23  GLUCOSE 112* 89 60* 100*  BUN 9 7 7  <5*  CALCIUM 8.0* 8.0* 8.2* 8.3*  CREATININE 0.83 0.76 0.74 0.60*  GFRNONAA >60 >60 >60 >60  GFRAA >60 >60 >60 >60    LIVER FUNCTION TESTS:  Recent Labs  07/06/17 0823 07/09/17 2230 07/10/17 0321 07/11/17 0340  BILITOT 0.59 1.5* 1.0 1.0  AST 22 28 18 16   ALT 7 12* 9* 10*  ALKPHOS 100 58 51 42  PROT 8.0 7.9 6.9 6.4*  ALBUMIN 2.9* 2.9* 2.5* 2.2*    Assessment and Plan: Pancreatic tail abscess s/p drain placement 07/11/17 by Dr. [name] Cx still pending but growing multiple organisms.  Drain to remain in place.  Patient nearing discharge once appropriate home antibiotics arranged.  Order placed for outpatient IR drain clinic follow-up.  Patient will hear from schedulers with time and date of appointment.  Continue routine drain care.  Change dressing prior to discharge.  IR to follow.   Electronically Signed: Docia Barrier, PA 07/13/2017, 3:08 PM   I spent a total of 15 Minutes at the the patient's bedside AND on the patient's hospital floor or unit, greater than 50% of which was counseling/coordinating care for pancreatic abscess.

## 2017-07-13 NOTE — Progress Notes (Signed)
PROGRESS NOTE [facility]   BAIN WHICHARD   QKM:[id number] [name]: 1961/09/02  [name]: 07/09/2017 PCP: [name], MD   Brief Narrative:  56 year old male with medical history significant for colon cancer on chemotherapy, DVT on Xarelto, anemia, depression presented with abdominal pain and diarrhea for 3 days prior to admission. Patient reported symptoms started after chemotherapy which was done 07/08/17. Patient was admitted for possible colitis and was started on Cipro and Flagyl. Despite patient being nothing by mouth and being treated with antibiotic patient continued with abdominal pain, a CT of the abdomen was done which showed a new abscess measuring 3.8 x 4.6 cm inferior to the pancreatic tail and spleen. There is also some inflammatory changes around the pancreas. Surgical team and medical oncology were consulted. Now s/p CT drainage with drain cath in place, awaiting cultures for abx therapy   Subjective: No complaints. Tolerating diet well    Assessment & Plan: Sepsis - secondary to Pancreatic tail abscess and ? Colitis  Patient met sepsis criteria on initial presentation with fever leukocytosis and tachycardia - sepsis physiology has resolved.  Initially with mild hypotension which responded well to IV fluid resuscitation. Patient initially treated for colitis, was started on Cipro and Flagyl - switched to Zosyn 07/10/17 Status post CT-guided drain on 07/11/2017. Discussed with ID will de-escalate abx treatment - will stop zosyn, will start Rocephin and oral Flagyl. Pending on cultures possible d/c with Augmentin until drain is removed.  Can repeat CT as outpatient per surgical recommendations  Lipase and Amylase normal Follow-up cultures - blood cultures thus far negative, body fluid cultures pending Continue supportive treatment   Adenocarcinoma of the ascending colon Currently on chemotherapy, last dose 07/08/2017. Per oncology   DVT of right lower extremity Xarelto  was held for procedure Xarelto resumed   Hyponatremia - resolved  Likely secondary to GI losses Patient remains on IV fluids  Normocytic anemia  Likely hemodilution from aggressive IVF  Hemoglobin stable No signs of overt bleeding  DVT prophylaxis: SCDs, Xarelto on  hold Code Status: Full code Family Communication: None at bedside Disposition Plan: Possible discharge in AM if cultures back   Consultants:   Gen Surgery   IR   Oncology   Procedures:   None  Antimicrobials: Anti-infectives    Start     Dose/Rate Route Frequency Ordered Stop   07/12/17 1400  cefTRIAXone (ROCEPHIN) 2 g in dextrose 5 % 50 mL IVPB     2 g 100 mL/hr over 30 Minutes Intravenous Every 24 hours 07/12/17 1138     07/12/17 1400  metroNIDAZOLE (FLAGYL) tablet 500 mg     500 mg Oral Every 8 hours 07/12/17 1138     07/10/17 1800  piperacillin-tazobactam (ZOSYN) IVPB 3.375 g  Status:  Discontinued     3.375 g 12.5 mL/hr over 240 Minutes Intravenous Every 8 hours 07/10/17 1626 07/12/17 1138   07/10/17 0045  metroNIDAZOLE (FLAGYL) IVPB 500 mg  Status:  Discontinued     500 mg 100 mL/hr over 60 Minutes Intravenous Every 8 hours 07/10/17 0039 07/10/17 1607   07/10/17 0045  ciprofloxacin (CIPRO) IVPB 400 mg  Status:  Discontinued     400 mg 200 mL/hr over 60 Minutes Intravenous Every 12 hours 07/10/17 0042 07/10/17 1607      Objective: Vitals:   07/12/17 1345 07/12/17 2103 07/13/17 0421 07/13/17 1425  BP: 101/70 107/79 120/87 (!) 134/100  Pulse: 92 79 78 70  Resp: 18 16 18 18   Temp:  97.7 F (36.5 C) 98 F (36.7 C) 98.3 F (36.8 C) 98 F (36.7 C)  TempSrc: Oral Oral Oral Oral  SpO2: 100% 100% 100% 100%  Weight:   61 kg (134 lb 7.7 oz)   Height:   5' 6"  (1.676 m)     Intake/Output Summary (Last 24 hours) at 07/13/17 1619 Last data filed at 07/13/17 1542  Gross per 24 hour  Intake              965 ml  Output             2350 ml  Net            -1385 ml   Filed Weights   07/10/17 0200  07/11/17 1558 07/13/17 0421  Weight: 57.5 kg (126 lb 12.2 oz) 60 kg (132 lb 4.4 oz) 61 kg (134 lb 7.7 oz)    Examination:  General: Pt is alert, awake, not in acute distress Cardiovascular: RRR, S1/S2 +, no rubs, no gallops Respiratory: CTA bilaterally, no wheezing, no rhonchi Abdominal: Soft, drain in place serosanguineous  Extremities: no edema, no cyanosis  Data Reviewed: I have personally reviewed following labs and imaging studies  CBC:  Recent Labs Lab 07/09/17 2230 07/10/17 0321 07/11/17 0340 07/12/17 0625  WBC 14.8* 11.1* 8.1 7.6  NEUTROABS 12.1*  --  5.8 5.5  HGB 12.3* 11.0* 10.7* 10.7*  HCT 34.9* 32.5* 31.6* 31.4*  MCV 83.7 83.5 86.1 85.1  PLT 118* 99* 129* 100   Basic Metabolic Panel:  Recent Labs Lab 07/09/17 2230 07/10/17 0321 07/10/17 1645 07/11/17 0340 07/12/17 0625 07/13/17 0559  NA 128* 132*  --  134* 135 136  K 3.6 3.1*  --  3.8 3.8 3.4*  CL 98* 103  --  109 109 107  CO2 21* 21*  --  20* 18* 23  GLUCOSE 114* 112*  --  89 60* 100*  BUN 10 9  --  7 7 <5*  CREATININE 0.97 0.83  --  0.76 0.74 0.60*  CALCIUM 8.3* 8.0*  --  8.0* 8.2* 8.3*  MG  --   --  1.3* 1.6* 1.4* 1.4*   GFR: Estimated Creatinine Clearance: 86.8 mL/min (A) (by C-G formula based on SCr of 0.6 mg/dL (L)). Liver Function Tests:  Recent Labs Lab 07/09/17 2230 07/10/17 0321 07/11/17 0340  AST 28 18 16   ALT 12* 9* 10*  ALKPHOS 58 51 42  BILITOT 1.5* 1.0 1.0  PROT 7.9 6.9 6.4*  ALBUMIN 2.9* 2.5* 2.2*    Recent Labs Lab 07/10/17 1645 07/11/17 0340  LIPASE 18 13  AMYLASE 46  --    Coagulation Profile:  Recent Labs Lab 07/11/17 0340  INR 1.27   CBG:  Recent Labs Lab 07/10/17 0843 07/11/17 0749 07/12/17 0800 07/12/17 0849 07/13/17 0751  GLUCAP 94 80 55* 102* 106*   Sepsis Labs:  Recent Labs Lab 07/10/17 0321 07/10/17 0624  PROCALCITON 0.72  --   LATICACIDVEN 0.7 0.6    Recent Results (from the past 240 hour(s))  MRSA PCR Screening     Status:  None   Collection Time: 07/10/17  2:13 AM  Result Value Ref Range Status   MRSA by PCR NEGATIVE NEGATIVE Final    Comment:        The GeneXpert MRSA Assay (FDA approved for NASAL specimens only), is one component of a comprehensive MRSA colonization surveillance program. It is not intended to diagnose MRSA infection nor to guide or monitor treatment for MRSA  infections.   Culture, blood (x 2)     Status: None (Preliminary result)   Collection Time: 07/10/17  4:12 AM  Result Value Ref Range Status   Specimen Description BLOOD LEFT ANTECUBITAL  Final   Special Requests   Final    BOTTLES DRAWN AEROBIC AND ANAEROBIC Blood Culture adequate volume   Culture   Final    NO GROWTH 2 DAYS Performed at [facility], [address] [place], [place] [address]    Report Status PENDING  Incomplete  Culture, blood (x 2)     Status: None (Preliminary result)   Collection Time: 07/10/17  4:18 AM  Result Value Ref Range Status   Specimen Description BLOOD BLOOD LEFT HAND  Final   Special Requests IN PEDIATRIC BOTTLE Blood Culture adequate volume  Final   Culture   Final    NO GROWTH 2 DAYS Performed at [facility], [place] [address], [place], [place] [address]    Report Status PENDING  Incomplete  C difficile quick scan w PCR reflex     Status: None   Collection Time: 07/11/17  7:50 AM  Result Value Ref Range Status   C Diff antigen NEGATIVE NEGATIVE Final   C Diff toxin NEGATIVE NEGATIVE Final   C Diff interpretation No C. difficile detected.  Final  Aerobic/Anaerobic Culture (surgical/deep wound)     Status: Abnormal (Preliminary result)   Collection Time: 07/11/17 11:07 AM  Result Value Ref Range Status   Specimen Description ABSCESS PANCREAS  Final   Special Requests Normal  Final   Gram Stain   Final    ABUNDANT WBC PRESENT, PREDOMINANTLY PMN MODERATE GRAM NEGATIVE RODS MODERATE GRAM POSITIVE RODS FEW GRAM POSITIVE COCCI IN PAIRS Performed at [facility],  [place] [address], [place], [place] [address]    Culture MULTIPLE ORGANISMS PRESENT, NONE PREDOMINANT (A)  Final   Report Status PENDING  Incomplete     Radiology Studies: No results found.   Scheduled Meds: . Clindamycin Phos-Benzoyl Perox  1 application Topical BID  . dextrose  25 mL Intravenous Once  . hydrocortisone cream   Topical BID  . lip balm  1 application Topical BID  . mouth rinse  15 mL Mouth Rinse BID  . metroNIDAZOLE  500 mg Oral Q8H  . mirtazapine  15 mg Oral QHS  . pantoprazole  40 mg Oral BID  . rivaroxaban  20 mg Oral Q supper  . saccharomyces boulardii  250 mg Oral BID  . sodium chloride flush  3 mL Intravenous Q12H  . sodium chloride flush  5 mL Intravenous Q8H   Continuous Infusions: . cefTRIAXone (ROCEPHIN)  IV Stopped (07/13/17 1431)  . methocarbamol (ROBAXIN)  IV       LOS: 3 days   Time spent: Total of 15 minutes spent with pt, greater than 50% of which was spent in discussion of  treatment, counseling and coordination of care   Chipper Oman, MD Pager: Text Page via [email or web address]  [phone]  If 7PM-7AM, please contact night-coverage [email or web address] Password [facility] 07/13/2017, 4:19 PM

## 2017-07-13 NOTE — Progress Notes (Signed)
Patient ID: Jeff [name], male   [name]: Jun 05, 1961, 56 y.o.   MRN: [id number]  [facility] Surgery Progress Note     Subjective: CC- pancreatic abscess Patient sitting up in bed. No complaints. Tolerating diet. Denies abdominal pain or n/v. 2 BM's yesterday. JP drain working well.  Objective: Vital signs in last 24 hours: Temp:  [97.7 F (36.5 C)-98.3 F (36.8 C)] 98.3 F (36.8 C) (07/17 0421) Pulse Rate:  [78-92] 78 (07/17 0421) Resp:  [16-18] 18 (07/17 0421) BP: (101-120)/(70-87) 120/87 (07/17 0421) SpO2:  [100 %] 100 % (07/17 0421) Weight:  [134 lb 7.7 oz (61 kg)] 134 lb 7.7 oz (61 kg) (07/17 0421) Last BM Date: 07/11/17  Intake/Output from previous day: 07/16 0701 - 07/17 0700 In: 2285 [P.O.:360; I.V.:1860; IV Piggyback:50] Out: 5277 [Urine:1450; Drains:25] Intake/Output this shift: Total I/O In: 360 [P.O.:360] Out: 750 [Urine:750]  PE: Gen:  Alert, NAD, pleasant HEENT: pupil equal, no scleral icterus, EOM's in tact Card:  Regular rate and rhythm, pedal pulses 2+ BL Pulm:  Normal effort, CTAB Abd: Soft, nondistended, +BS, nontender abdomen, no hernia or HSM, JP drain with serosanguinous/slightly cloudy fluid in bulb             JP drain: 25cc/24hr Skin: warm and dry, no rashes  Psych: A&Ox3   Lab Results:   Recent Labs  07/11/17 0340 07/12/17 0625  WBC 8.1 7.6  HGB 10.7* 10.7*  HCT 31.6* 31.4*  PLT 129* 152   BMET  Recent Labs  07/12/17 0625 07/13/17 0559  NA 135 136  K 3.8 3.4*  CL 109 107  CO2 18* 23  GLUCOSE 60* 100*  BUN 7 <5*  CREATININE 0.74 0.60*  CALCIUM 8.2* 8.3*   PT/INR  Recent Labs  07/11/17 0340  LABPROT 16.0*  INR 1.27   CMP     Component Value Date/Time   NA 136 07/13/2017 0559   NA 134 (L) 07/06/2017 0823   K 3.4 (L) 07/13/2017 0559   K 3.6 07/06/2017 0823   CL 107 07/13/2017 0559   CO2 23 07/13/2017 0559   CO2 23 07/06/2017 0823   GLUCOSE 100 (H) 07/13/2017 0559   GLUCOSE 102 07/06/2017 0823   BUN <5 (L)  07/13/2017 0559   BUN 5.1 (L) 07/06/2017 0823   CREATININE 0.60 (L) 07/13/2017 0559   CREATININE 0.8 07/06/2017 0823   CALCIUM 8.3 (L) 07/13/2017 0559   CALCIUM 9.3 07/06/2017 0823   PROT 6.4 (L) 07/11/2017 0340   PROT 8.0 07/06/2017 0823   ALBUMIN 2.2 (L) 07/11/2017 0340   ALBUMIN 2.9 (L) 07/06/2017 0823   AST 16 07/11/2017 0340   AST 22 07/06/2017 0823   ALT 10 (L) 07/11/2017 0340   ALT 7 07/06/2017 0823   ALKPHOS 42 07/11/2017 0340   ALKPHOS 100 07/06/2017 0823   BILITOT 1.0 07/11/2017 0340   BILITOT 0.59 07/06/2017 0823   GFRNONAA >60 07/13/2017 0559   GFRAA >60 07/13/2017 0559   Lipase     Component Value Date/Time   LIPASE 13 07/11/2017 0340       Studies/Results: Ct Image Guided Drainage By Percutaneous Catheter  Result Date: 07/11/2017 INDICATION: PANCREATIC TAIL ABSCESS EXAM: CT GUIDED DRAINAGE OF THE PANCREATIC TAIL ABSCESS MEDICATIONS: The patient is currently admitted to the hospital and receiving intravenous antibiotics. The antibiotics were administered within an appropriate time frame prior to the initiation of the procedure. ANESTHESIA/SEDATION: 1.0 mg IV Versed 50 mcg IV Fentanyl Moderate Sedation Time:  10 minutes The patient  was continuously monitored during the procedure by the interventional radiology nurse under my direct supervision. COMPLICATIONS: None immediate. TECHNIQUE: Informed written consent was obtained from the patient after a thorough discussion of the procedural risks, benefits and alternatives. All questions were addressed. Maximal Sterile Barrier Technique was utilized including caps, mask, sterile gowns, sterile gloves, sterile drape, hand hygiene and skin antiseptic. A timeout was performed prior to the initiation of the procedure. PROCEDURE: Previous imaging reviewed. Patient positioned left anterior oblique. Initial noncontrast imaging performed through the abdomen. The pancreatic tail abscess was difficult to visualize without contrast.  Therefore 75 cc contrast administered IV. Repeat imaging performed with contrast. The pancreatic tail abscess was localized. Overlying skin marked. Under sterile conditions and local anesthesia, an 18 gauge 10 cm access needle was advanced from a lower intercostal lateral approach just inferior to the spleen into the abscess. Needle position confirmed with CT. Syringe aspiration yielded purulent fluid. Sample sent for culture. Amplatz guidewire inserted followed by tract dilatation to insert a 10 [place] drain. Drain catheter position confirmed with CT. Catheter secured with a Prolene suture and connected to external gravity drainage. Sterile dressing applied. No immediate complication. Patient tolerated the procedure well. FINDINGS: CT imaging confirms needle placed within the inferior pancreatic tail abscess for drain insertion IMPRESSION: Successful CT-guided pancreatic tail abscess drain placement Electronically Signed   By: Jerilynn Mages.  Shick M.D.   On: 07/11/2017 11:12    Anti-infectives: Anti-infectives    Start     Dose/Rate Route Frequency Ordered Stop   07/12/17 1400  cefTRIAXone (ROCEPHIN) 2 g in dextrose 5 % 50 mL IVPB     2 g 100 mL/hr over 30 Minutes Intravenous Every 24 hours 07/12/17 1138     07/12/17 1400  metroNIDAZOLE (FLAGYL) tablet 500 mg     500 mg Oral Every 8 hours 07/12/17 1138     07/10/17 1800  piperacillin-tazobactam (ZOSYN) IVPB 3.375 g  Status:  Discontinued     3.375 g 12.5 mL/hr over 240 Minutes Intravenous Every 8 hours 07/10/17 1626 07/12/17 1138   07/10/17 0045  metroNIDAZOLE (FLAGYL) IVPB 500 mg  Status:  Discontinued     500 mg 100 mL/hr over 60 Minutes Intravenous Every 8 hours 07/10/17 0039 07/10/17 1607   07/10/17 0045  ciprofloxacin (CIPRO) IVPB 400 mg  Status:  Discontinued     400 mg 200 mL/hr over 60 Minutes Intravenous Every 12 hours 07/10/17 0042 07/10/17 1607       Assessment/Plan Pancreatic abscess in the setting of colon adenocarcinoma with extension to  spleen/pancreas/LN - S/P CT GUIDED DRAINAGE PANCREATIC TAIL ABSCESS 07/11/17 Dr. [name]  - CX growing mult organisms present, none predominant, report pending - tolerating regular diet, VSS - ok for discharge with drain from surgical standpoint, when able to narrow antibiotics. Antibiotics per ID. Patient will need follow-up in drain clinic and with Dr. Johney Maine.   Hx DVT RLE - Xarelto Normocytic anemia - hgb 10.7/hct 31.4 (7/16) FEN - regular diet ID - cipro 7/14,  Zosyn 7/14-7/16, Rocephin/Flagyl 7/16 >> VTE - SCD's, Xarelto   LOS: 3 days    Jerrye Beavers , [facility] Surgery 07/13/2017, 10:44 AM Pager: [phone] Consults: [phone] Mon-Fri 7:00 am-4:30 pm Sat-Sun 7:00 am-11:30 am

## 2017-07-13 NOTE — Progress Notes (Signed)
Pharmacy IV to PO conversion  The patient is receiving Protonix and Benadryl by the intravenous route.  Based on criteria approved by the Pharmacy and [facility], the medication is being converted to the equivalent oral dose form.   No active GI bleeding or impaired absorption  Not s/p esophagectomy  Documented ability to take oral medications for > 24 hr  Plan to continue treatment for at least 1 day  If you have any questions about this conversion, please contact the Pharmacy Department (ext [phone]).  Thank you.  Reuel Boom, PharmD Pager: [phone] 07/13/2017, 2:51 PM

## 2017-07-13 NOTE — Discharge Instructions (Signed)
Percutaneous Abscess Drain, Care After °This sheet gives you information about how to care for yourself after your procedure. Your health care provider may also give you more specific instructions. If you have problems or questions, contact your health care provider. °What can I expect after the procedure? °After your procedure, it is common to have: °· A small amount of bruising and discomfort in the area where the drainage tube (catheter) was placed. °· Sleepiness and fatigue. This should go away after the medicines you were given have worn off. ° °Follow these instructions at home: °Incision care °· Follow instructions from your health care provider about how to take care of your incision. Make sure you: °? Wash your hands with soap and water before you change your bandage (dressing). If soap and water are not available, use hand sanitizer. °? Change your dressing as told by your health care provider. °? Leave stitches (sutures), skin glue, or adhesive strips in place. These skin closures may need to stay in place for 2 weeks or longer. If adhesive strip edges start to loosen and curl up, you may trim the loose edges. Do not remove adhesive strips completely unless your health care provider tells you to do that. °· Check your incision area every day for signs of infection. Check for: °? More redness, swelling, or pain. °? More fluid or blood. °? Warmth. °? Pus or a bad smell. °? Fluid leaking from around your catheter (instead of fluid draining through your catheter). °Catheter care °· Follow instructions from your health care provider about emptying and cleaning your catheter and collection bag. You may need to clean the catheter every day so it does not clog. °· If directed, write down the following information every time you empty your bag: °? The date and time. °? The amount of drainage. °General instructions °· Rest at home for 1-2 days after your procedure. Return to your normal activities as told by your  health care provider. °· Do not take baths, swim, or use a hot tub for 24 hours after your procedure, or until your health care provider says that this is okay. °· Take over-the-counter and prescription medicines only as told by your health care provider. °· Keep all follow-up visits as told by your health care provider. This is important. °Contact a health care provider if: °· You have less than 10 mL of drainage a day for 2-3 days in a row, or as directed by your health care provider. °· You have more redness, swelling, or pain around your incision area. °· You have more fluid or blood coming from your incision area. °· Your incision area feels warm to the touch. °· You have pus or a bad smell coming from your incision area. °· You have fluid leaking from around your catheter (instead of through your catheter). °· You have a fever or chills. °· You have pain that does not get better with medicine. °Get help right away if: °· Your catheter comes out. °· You suddenly stop having drainage from your catheter. °· You suddenly have blood in the fluid that is draining from your catheter. °· You become dizzy or you faint. °· You develop a rash. °· You have nausea or vomiting. °· You have difficulty breathing or you feel short of breath. °· You develop chest pain. °· You have problems with your speech or vision. °· You have trouble balancing or moving your arms or legs. °Summary °· It is common to have a small   amount of bruising and discomfort in the area where the drainage tube (catheter) was placed. °· You may be directed to record the amount of drainage from the bag every time you empty it. °· Follow instructions from your health care provider about emptying and cleaning your catheter and collection bag. °This information is not intended to replace advice given to you by your health care provider. Make sure you discuss any questions you have with your health care provider. °Document Released: 04/30/2014 Document  Revised: 11/05/2016 Document Reviewed: 11/05/2016 °Elsevier Interactive Patient Education © 2017 [employer] ° °

## 2017-07-13 NOTE — Care Management Note (Signed)
Case Management Note  Patient Details  Name: Jeff [name] MRN: [id number] Date of Birth: 05-May-1961  Subjective/Objective:   56 yo admitted with Intra-abdominal abscess. Hx of metastatic colon cancer, on palliative chemotherapy                 Action/Plan: Pt from home and independent with ADLs. CM will follow for DC needs.  Expected Discharge Date:  07/12/17               Expected Discharge Plan:  Home/Self Care  In-House Referral:     Discharge planning Services  CM Consult  Post Acute Care Choice:    Choice offered to:     DME Arranged:    DME Agency:     HH Arranged:    HH Agency:     Status of Service:  In process, will continue to follow  If discussed at Long Length of Stay Meetings, dates discussed:    Additional CommentsLynnell Catalan, RN 07/13/2017, 11:03 AM  [phone]

## 2017-07-14 ENCOUNTER — Other Ambulatory Visit: Payer: Self-pay | Admitting: Surgery

## 2017-07-14 ENCOUNTER — Other Ambulatory Visit (HOSPITAL_COMMUNITY): Payer: Self-pay | Admitting: Student

## 2017-07-14 DIAGNOSIS — K651 Peritoneal abscess: Secondary | ICD-10-CM

## 2017-07-14 LAB — BASIC METABOLIC PANEL
Anion gap: 6 (ref 5–15)
CHLORIDE: 106 mmol/L (ref 101–111)
CO2: 25 mmol/L (ref 22–32)
Calcium: 8.5 mg/dL — ABNORMAL LOW (ref 8.9–10.3)
Creatinine, Ser: 0.68 mg/dL (ref 0.61–1.24)
GFR calc Af Amer: >60 mL/min (ref >60)
GLUCOSE: 110 mg/dL — AB (ref 65–99)
POTASSIUM: 3.7 mmol/L (ref 3.5–5.1)
Sodium: 137 mmol/L (ref 135–145)

## 2017-07-14 LAB — GLUCOSE, CAPILLARY: Glucose-Capillary: 94 mg/dL (ref 65–99)

## 2017-07-14 LAB — MAGNESIUM: Magnesium: 1.3 mg/dL — ABNORMAL LOW (ref 1.7–2.4)

## 2017-07-14 MED ORDER — AMOXICILLIN-POT CLAVULANATE 875-125 MG PO TABS: 1.0000 | ORAL_TABLET | Freq: Two times a day (BID) | ORAL | 0 refills | Status: AC

## 2017-07-14 MED ORDER — HEPARIN SOD (PORK) LOCK FLUSH 100 UNIT/ML IV SOLN
500.0000 [IU] | Freq: Once | INTRAVENOUS | Status: DC
  Filled 2017-07-14: qty 5

## 2017-07-14 MED FILL — AMOX TR-K CLV 875-125 MG TA: 875-125 | 14 days supply | Qty: 28 | Fill #0

## 2017-07-14 NOTE — Progress Notes (Signed)
Patient was given teaching how to change dressing on drain site in LUQ abdomen and how to flush drain with 5 ml saline 3x a day, how to empty JP draind and how to charge it.Patient was also given dressing and flushing supplies . General discharge instructions given , patient verbalized understanding of all instructions.

## 2017-07-14 NOTE — Discharge Summary (Signed)
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   Specimen Description BLOOD BLOOD LEFT HAND  Final   Special Requests IN PEDIATRIC BOTTLE Blood Culture adequate volume  Final   Culture   Final    NO GROWTH 3 DAYS Performed at [facility], [place] [address] [place], [place] [address]    Report Status PENDING  Incomplete  C difficile quick scan w PCR reflex     Status: None   Collection Time: 07/11/17  7:50 AM  Result Value Ref Range Status   C Diff antigen NEGATIVE NEGATIVE Final   C Diff toxin NEGATIVE NEGATIVE Final   C Diff interpretation No C. difficile detected.  Final  Aerobic/Anaerobic Culture (surgical/deep wound)     Status: Abnormal (Preliminary result)   Collection Time: 07/11/17 11:07 AM  Result Value Ref Range Status   Specimen Description ABSCESS PANCREAS  Final   Special Requests Normal  Final   Gram Stain   Final    ABUNDANT WBC PRESENT, PREDOMINANTLY PMN MODERATE GRAM NEGATIVE RODS MODERATE GRAM POSITIVE RODS FEW GRAM POSITIVE COCCI IN PAIRS    Culture (A)  Final    MULTIPLE ORGANISMS PRESENT, NONE PREDOMINANT HOLDING FOR POSSIBLE ANAEROBE Performed at [name], [place] [address], [place], [place] [address]    Report Status PENDING  Incomplete     Labs: BNP (last 3 results) No results for input(s): BNP in the last 8760 hours. Basic Metabolic Panel:  Recent Labs Lab 07/10/17 0321 07/10/17 1645 07/11/17 0340 07/12/17 0625 07/13/17 0559 07/14/17 0529  NA 132*  --  134* 135 136 137  K 3.1*  --  3.8 3.8 3.4* 3.7  CL 103  --  109 109 107 106  CO2 21*  --  20* 18* 23 25  GLUCOSE 112*  --  89 60* 100* 110*  BUN 9  --  7 7 <5* <5*  CREATININE 0.83  --  0.76 0.74 0.60* 0.68  CALCIUM 8.0*  --  8.0* 8.2* 8.3*  8.5*  MG  --  1.3* 1.6* 1.4* 1.4* 1.3*   Liver Function Tests:  Recent Labs Lab 07/09/17 2230 07/10/17 0321 07/11/17 0340  AST 28 18 16   ALT 12* 9* 10*  ALKPHOS 58 51 42  BILITOT 1.5* 1.0 1.0  PROT 7.9 6.9 6.4*  ALBUMIN 2.9* 2.5* 2.2*    Recent Labs Lab 07/10/17 1645 07/11/17 0340  LIPASE 18 13  AMYLASE 46  --    No results for input(s): AMMONIA in the last 168 hours. CBC:  Recent Labs Lab 07/09/17 2230 07/10/17 0321 07/11/17 0340 07/12/17 0625  WBC 14.8* 11.1* 8.1 7.6  NEUTROABS 12.1*  --  5.8 5.5  HGB 12.3* 11.0* 10.7* 10.7*  HCT 34.9* 32.5* 31.6* 31.4*  MCV 83.7 83.5 86.1 85.1  PLT 118* 99* 129* 152   Cardiac Enzymes: No results for input(s): CKTOTAL, CKMB, CKMBINDEX, TROPONINI in the last 168 hours. BNP: Invalid input(s): POCBNP CBG:  Recent Labs Lab 07/11/17 0749 07/12/17 0800 07/12/17 0849 07/13/17 0751 07/14/17 0736  GLUCAP 80 55* 102* 106* 94   D-Dimer No results for input(s): DDIMER in the last 72 hours. Hgb A1c No results for input(s): HGBA1C in the last 72 hours. Lipid Profile No results for input(s): CHOL, HDL, LDLCALC, TRIG, CHOLHDL, LDLDIRECT in the last 72 hours. Thyroid function studies No results for input(s): TSH, T4TOTAL, T3FREE, THYROIDAB in the last 72 hours.  Invalid input(s): FREET3 Anemia work up No results for input(s): VITAMINB12, FOLATE, FERRITIN, TIBC, IRON, RETICCTPCT in the last  72 hours. Urinalysis    Component Value Date/Time   COLORURINE YELLOW 07/10/2017 0045   APPEARANCEUR CLEAR 07/10/2017 0045   LABSPEC 1.009 07/10/2017 0045   PHURINE 5.0 07/10/2017 0045   GLUCOSEU NEGATIVE 07/10/2017 0045   HGBUR MODERATE (A) 07/10/2017 0045   BILIRUBINUR NEGATIVE 07/10/2017 0045   KETONESUR NEGATIVE 07/10/2017 0045   PROTEINUR NEGATIVE 07/10/2017 0045   NITRITE NEGATIVE 07/10/2017 0045   LEUKOCYTESUR NEGATIVE 07/10/2017 0045   Sepsis Labs Invalid input(s): PROCALCITONIN,  WBC,  LACTICIDVEN Microbiology Recent Results (from the past 240 hour(s))  MRSA PCR Screening     Status: None   Collection Time: 07/10/17  2:13 AM  Result Value Ref Range Status   MRSA by PCR NEGATIVE NEGATIVE Final    Comment:        The GeneXpert MRSA Assay (FDA approved for NASAL specimens only), is one component of a comprehensive MRSA colonization surveillance program. It is not intended to diagnose MRSA infection nor to guide or monitor treatment for MRSA infections.   Culture, blood (x 2)     Status: None (Preliminary result)   Collection Time: 07/10/17  4:12 AM  Result Value Ref Range Status   Specimen Description BLOOD LEFT ANTECUBITAL  Final   Special Requests   Final    BOTTLES DRAWN AEROBIC AND ANAEROBIC Blood Culture adequate volume   Culture   Final    NO GROWTH 3 DAYS Performed at [facility], [address], [place], [place] [address]    Report Status PENDING  Incomplete  Culture, blood (x 2)     Status: None (Preliminary result)   Collection Time: 07/10/17  4:18 AM  Result Value Ref Range Status   Specimen Description BLOOD BLOOD LEFT HAND  Final   Special Requests IN PEDIATRIC BOTTLE Blood Culture adequate volume  Final   Culture   Final    NO GROWTH 3 DAYS Performed at [facility], [place],  [place] [address]    Report Status PENDING  Incomplete  C difficile quick scan w PCR reflex     Status: None   Collection Time: 07/11/17   7:50 AM  Result Value Ref Range Status   C Diff antigen NEGATIVE NEGATIVE Final   C Diff toxin NEGATIVE NEGATIVE Final   C Diff interpretation No C. difficile detected.  Final  Aerobic/Anaerobic Culture (surgical/deep wound)     Status: Abnormal (Preliminary result)   Collection Time: 07/11/17 11:07 AM  Result Value Ref Range Status   Specimen Description ABSCESS PANCREAS  Final   Special Requests Normal  Final   Gram Stain   Final    ABUNDANT WBC PRESENT, PREDOMINANTLY PMN MODERATE GRAM NEGATIVE RODS MODERATE GRAM POSITIVE RODS FEW GRAM POSITIVE COCCI IN PAIRS    Culture (A)  Final    MULTIPLE ORGANISMS PRESENT, NONE PREDOMINANT HOLDING FOR POSSIBLE ANAEROBE Performed at [facility], [place] [address], [place], [place] [address]    Report Status PENDING  Incomplete     Time coordinating discharge: 40 minutes  SIGNED:  Dessa Phi, DO [facility] Pager [phone]  If 7PM-7AM, please contact night-coverage [email or web address] Password [facility] 07/14/2017, 12:05 PM

## 2017-07-14 NOTE — Progress Notes (Signed)
CC:  Diarrhea and abdominal pain  Subjective: No pain and drainage is clear.  Tolerating diet.  He was being followed up by Dr. Barry Dienes in January.    Objective: Vital signs in last 24 hours: Temp:  [97.8 F (36.6 C)-98 F (36.7 C)] 97.8 F (36.6 C) (07/18 0521) Pulse Rate:  [70-90] 90 (07/18 0521) Resp:  [14-18] 14 (07/18 0521) BP: (90-134)/(74-100) 90/74 (07/18 0521) SpO2:  [100 %] 100 % (07/18 0521) Last BM Date: 07/11/17 1440 Po Urine 2525 Drain 15 ml BM x 1 Afebrile, VSS  BMP OK Mag is low today, no CBC   Intake/Output from previous day: 07/17 0701 - 07/18 0700 In: 1455 [P.O.:1440] Out: 2540 [Urine:2525; Drains:15] Intake/Output this shift: Total I/O In: 240 [P.O.:240] Out: -   General appearance: alert, cooperative and no distress Resp: clear to auscultation bilaterally GI: soft, non-tender; bowel sounds normal; no masses,  no organomegaly and drainage from drain is serous fluid.    Lab Results:   Recent Labs  07/12/17 0625  WBC 7.6  HGB 10.7*  HCT 31.4*  PLT 152    BMET  Recent Labs  07/13/17 0559 07/14/17 0529  NA 136 137  K 3.4* 3.7  CL 107 106  CO2 23 25  GLUCOSE 100* 110*  BUN <5* <5*  CREATININE 0.60* 0.68  CALCIUM 8.3* 8.5*   PT/INR No results for input(s): LABPROT, INR in the last 72 hours.   Recent Labs Lab 07/09/17 2230 07/10/17 0321 07/11/17 0340  AST 28 18 16   ALT 12* 9* 10*  ALKPHOS 58 51 42  BILITOT 1.5* 1.0 1.0  PROT 7.9 6.9 6.4*  ALBUMIN 2.9* 2.5* 2.2*     Lipase     Component Value Date/Time   LIPASE 13 07/11/2017 0340     Medications: . Clindamycin Phos-Benzoyl Perox  1 application Topical BID  . dextrose  25 mL Intravenous Once  . hydrocortisone cream   Topical BID  . lip balm  1 application Topical BID  . mouth rinse  15 mL Mouth Rinse BID  . metroNIDAZOLE  500 mg Oral Q8H  . mirtazapine  15 mg Oral QHS  . pantoprazole  40 mg Oral BID  . rivaroxaban  20 mg Oral Q supper  . saccharomyces  boulardii  250 mg Oral BID  . sodium chloride flush  3 mL Intravenous Q12H  . sodium chloride flush  5 mL Intravenous Q8H   . cefTRIAXone (ROCEPHIN)  IV Stopped (07/13/17 1431)  . methocarbamol (ROBAXIN)  IV     Anti-infectives    Start     Dose/Rate Route Frequency Ordered Stop   07/12/17 1400  cefTRIAXone (ROCEPHIN) 2 g in dextrose 5 % 50 mL IVPB     2 g 100 mL/hr over 30 Minutes Intravenous Every 24 hours 07/12/17 1138     07/12/17 1400  metroNIDAZOLE (FLAGYL) tablet 500 mg     500 mg Oral Every 8 hours 07/12/17 1138     07/10/17 1800  piperacillin-tazobactam (ZOSYN) IVPB 3.375 g  Status:  Discontinued     3.375 g 12.5 mL/hr over 240 Minutes Intravenous Every 8 hours 07/10/17 1626 07/12/17 1138   07/10/17 0045  metroNIDAZOLE (FLAGYL) IVPB 500 mg  Status:  Discontinued     500 mg 100 mL/hr over 60 Minutes Intravenous Every 8 hours 07/10/17 0039 07/10/17 1607   07/10/17 0045  ciprofloxacin (CIPRO) IVPB 400 mg  Status:  Discontinued     400 mg 200 mL/hr  over 60 Minutes Intravenous Every 12 hours 07/10/17 0042 07/10/17 1607       Assessment/Plan 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 Barry Dienes as previously planned. Sepsis IR Drain placement 07/11/17 DVT RLE on Xarelto Severe PCM Hyponatremia Depression FEN:  Regular diet ID:  Cipro/Flagyl 7/13-7/14 - Zosyn 7/14-7/16  -  Ceftriaxone/Flagyl IV 7/16 - day 3 DVT:  Xarelto   Plan:  He will need a repeat CT scan by the drain clinic.  Follow up with Dr.Gross is being arranged for IR drain, and follow up with [name] - Oncology to be arranged.     LOS: 4 days    Jahmel Flannagan 07/14/2017 [phone]

## 2017-07-15 LAB — CULTURE, BLOOD (ROUTINE X 2)
Culture: NO GROWTH
Culture: NO GROWTH
SPECIAL REQUESTS: ADEQUATE
Special Requests: ADEQUATE

## 2017-07-15 NOTE — Congregational Nurse Program (Signed)
Congregational Nurse Program Note  Date of Encounter: 07/13/2017  Past Medical History: Past Medical History:  Diagnosis Date  . Acute deep vein thrombosis (DVT) of right lower extremity ([place]) 02/18/2017  . 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 Microcytic anemia    [name] 01/11/2017    Encounter Details:     CNP Questionnaire - 07/15/17 0015      Patient Demographics   Is this a new or existing patient? Existing   Patient is considered a/an Not Applicable   Race African-American/Black     Patient Assistance   Location of Patient Assistance [facility] patient   Patient's financial/insurance status Medicaid   Uninsured Patient ([facility]) No   Patient referred to apply for the following financial assistance Not Applicable   Food insecurities addressed Not Applicable   Transportation assistance No   Assistance securing medications No   Educational health offerings Chronic disease;Nutrition;Acute disease     Encounter Details   Primary purpose of visit Education/Health Concerns;Spiritual Care/Support Visit;Acute Illness/Condition Visit;Chronic Illness/Condition Visit;Post ED/Hospitalization Visit   Was an Emergency Department visit averted? Not Applicable   Does patient have a medical provider? Yes   Patient referred to Follow up with established PCP   Was a mental health screening completed? (GAINS tool) No   Does patient have dental issues? No   Does patient have vision issues? No   Does your patient have an abnormal blood pressure today? No   Since previous encounter, have you referred patient for abnormal blood pressure that resulted in a new diagnosis or medication change? No   Does your patient have an abnormal blood glucose today? No   Since previous encounter, have you referred patient for abnormal blood glucose that resulted in a new diagnosis or medication change? No   Was there a life-saving intervention made? No    Client  hospitalized and [facility] nurse made a visit to  check up on client  SW had also visited to check on client.. Client sitting up in bed ,talkative ,daughter in states today has been his best day . To be discharged home tomorrow. Client described that his pain never got better after his treatment last Thursday . States he became weak . Clent will have several appointments upon discharge to follow up on .  [facility] team will follow up past  discharge.

## 2017-07-16 LAB — AEROBIC/ANAEROBIC CULTURE W GRAM STAIN (SURGICAL/DEEP WOUND)

## 2017-07-16 LAB — AEROBIC/ANAEROBIC CULTURE (SURGICAL/DEEP WOUND): SPECIAL REQUESTS: NORMAL

## 2017-07-19 ENCOUNTER — Emergency Department (HOSPITAL_COMMUNITY)
Admission: EM | Admit: 2017-07-19 | Discharge: 2017-07-19 | Disposition: A | Discharge status: Home / Self Care | Payer: Medicaid Other | Attending: Emergency Medicine | Admitting: Emergency Medicine

## 2017-07-19 ENCOUNTER — Encounter (HOSPITAL_COMMUNITY): Payer: Self-pay | Admitting: Emergency Medicine

## 2017-07-19 ENCOUNTER — Emergency Department (HOSPITAL_COMMUNITY): Payer: Medicaid Other

## 2017-07-19 DIAGNOSIS — Y828 Other medical devices associated with adverse incidents: Secondary | ICD-10-CM | POA: Diagnosis not present

## 2017-07-19 DIAGNOSIS — Z79899 Other long term (current) drug therapy: Secondary | ICD-10-CM | POA: Insufficient documentation

## 2017-07-19 DIAGNOSIS — Z7901 Long term (current) use of anticoagulants: Secondary | ICD-10-CM | POA: Diagnosis not present

## 2017-07-19 DIAGNOSIS — T85698D Other mechanical complication of other specified internal prosthetic devices, implants and grafts, subsequent encounter: Secondary | ICD-10-CM | POA: Insufficient documentation

## 2017-07-19 HISTORY — PX: IR CATHETER TUBE CHANGE: IMG717

## 2017-07-19 MED ORDER — IOPAMIDOL (ISOVUE-300) INJECTION 61%
INTRAVENOUS | Status: AC
  Administered 2017-07-19: 5 mL
  Filled 2017-07-19: qty 50

## 2017-07-19 MED ORDER — LIDOCAINE HCL 1 % IJ SOLN
INTRAMUSCULAR | Status: AC
  Filled 2017-07-19: qty 20

## 2017-07-19 MED ORDER — IOPAMIDOL (ISOVUE-300) INJECTION 61%
50.0000 mL | Freq: Once | INTRAVENOUS | Status: AC | PRN
  Administered 2017-07-19: 5 mL
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
## 2017-07-19 NOTE — Procedures (Signed)
Interventional Radiology Procedure Note  Procedure: Fluoro guided exchange of damaged pigtail drain at the left upper abdomen. New 61F drain. .  Complications: None Recommendations:  - To suction bulb. Continue daily output record and flushes - Do not submerge - Routine drain care  - follow up with scheduled appts  Signed,  Dulcy Fanny. Earleen Newport, DO

## 2017-07-19 NOTE — ED Notes (Signed)
Off floor for procedure.

## 2017-07-19 NOTE — Discharge Instructions (Signed)
Follow the instructions that the IR doctors gave you in order to complete draining as directed.  Follow-up as instructed by the IR doctors.   Return the emergency Department for any pain, difficulty with the draining, fever, chest pain, difficulty breathing vomiting or any other worsening or concerning symptoms.

## 2017-07-19 NOTE — ED Triage Notes (Signed)
Pt unable to loosed and disconnect wound drain to left abdomen. Drain was placed on Wednesday. Pt told to flush drain periodically, but pt is unable to loosen connection to flush drain. Last chemo last Thursday. No recent radiation.

## 2017-07-20 ENCOUNTER — Telehealth: Payer: Self-pay | Admitting: *Deleted

## 2017-07-20 ENCOUNTER — Encounter (HOSPITAL_COMMUNITY): Payer: Self-pay | Admitting: Interventional Radiology

## 2017-07-20 DIAGNOSIS — C186 Malignant neoplasm of descending colon: Secondary | ICD-10-CM

## 2017-07-20 MED ORDER — OXYCODONE HCL 10 MG PO TABS: 10.0000 mg | ORAL_TABLET | Freq: Four times a day (QID) | ORAL | 0 refills | Status: DC | PRN

## 2017-07-20 MED FILL — oxyCODONE HCL 10 MG TABS: 10 | 7 days supply | Qty: 30 | Fill #0

## 2017-07-20 NOTE — Telephone Encounter (Signed)
Received vm call from pt asking for return call.  Called pt back & he states he was recently d/c from hosp-last wed & wanted to know next appt.  Informed of this & he also would like a refill on his pain med.  Will call pt when ready.

## 2017-07-22 ENCOUNTER — Encounter: Payer: Self-pay | Admitting: *Deleted

## 2017-07-22 NOTE — Progress Notes (Signed)
[place] Work  Clinical Social Work was referred by patient's daughter for assessment of psychosocial needs.  Clinical Social Worker spoke with daughter to follow up on current living situation and plans to move to an apartment. Pt continues at extended stay motel through the [place] where RN and SW are assisting pt with housing search and following up on other needs. Daughter reports pt has an apartment that will be ready Aug 18, but is concerned about deposit for electrical bill. Pt currently gets only part of his SSI due to [employer] providing housing currently. He had to pay to get his truck repaired recently and may not have much saved. He should have his SSI go up to $750 once he moves. Daughter was wondering about leftover grant funds and any other assistance. She plans to check with financial counselor about grant funds and talk with her father/patient about his options/plans to cover these needed moving costs. CSW educated daughter about the [place] assistance that could possibly help with rent or utilities. Pt could also receive assistance through [place] in Sept as they will be closed in Aug. CSW team to check in at appt on 07/26/17 to review possible assistance options and what pt could afford to cover as well.    Clinical Social Work interventions: Resource education and referral  Loren Racer, LCSW, OSW-C Clinical Social Worker [facility] Phone: [phone] Fax: [phone]

## 2017-07-23 ENCOUNTER — Telehealth: Payer: Self-pay | Admitting: Hematology

## 2017-07-23 NOTE — Telephone Encounter (Signed)
Patient called and wanted to move his appointment he thought it was today 7/27 and I told him it was Monday 7/30 at 9:45  The patient is under the impression that he is to have a treatment on Monday 7/30 and a nutritionist Ernestene Kiel) is to F/u with him in the infusion room. The patient will not be there his next infusion is 8/13

## 2017-07-25 DIAGNOSIS — K859 Acute pancreatitis without necrosis or infection, unspecified: Secondary | ICD-10-CM | POA: Insufficient documentation

## 2017-07-26 ENCOUNTER — Telehealth: Payer: Self-pay

## 2017-07-26 ENCOUNTER — Ambulatory Visit (HOSPITAL_BASED_OUTPATIENT_CLINIC_OR_DEPARTMENT_OTHER): Payer: Medicaid Other

## 2017-07-26 ENCOUNTER — Other Ambulatory Visit: Payer: Self-pay | Admitting: General Surgery

## 2017-07-26 ENCOUNTER — Encounter: Payer: Self-pay | Admitting: Hematology

## 2017-07-26 ENCOUNTER — Encounter: Payer: Self-pay | Admitting: *Deleted

## 2017-07-26 ENCOUNTER — Encounter: Payer: Medicaid Other | Admitting: Nutrition

## 2017-07-26 ENCOUNTER — Ambulatory Visit (HOSPITAL_BASED_OUTPATIENT_CLINIC_OR_DEPARTMENT_OTHER): Payer: Medicaid Other | Admitting: Hematology

## 2017-07-26 ENCOUNTER — Other Ambulatory Visit (HOSPITAL_BASED_OUTPATIENT_CLINIC_OR_DEPARTMENT_OTHER): Payer: Medicaid Other

## 2017-07-26 ENCOUNTER — Ambulatory Visit: Payer: Medicaid Other

## 2017-07-26 VITALS — BP 96/70 | HR 91 | Temp 97.7°F | Resp 18 | Ht 69.0 in | Wt 125.5 lb

## 2017-07-26 DIAGNOSIS — K651 Peritoneal abscess: Secondary | ICD-10-CM

## 2017-07-26 DIAGNOSIS — Z452 Encounter for adjustment and management of vascular access device: Secondary | ICD-10-CM | POA: Diagnosis present

## 2017-07-26 DIAGNOSIS — C186 Malignant neoplasm of descending colon: Secondary | ICD-10-CM

## 2017-07-26 DIAGNOSIS — Z86718 Personal history of other venous thrombosis and embolism: Secondary | ICD-10-CM | POA: Diagnosis not present

## 2017-07-26 DIAGNOSIS — D6959 Other secondary thrombocytopenia: Secondary | ICD-10-CM

## 2017-07-26 DIAGNOSIS — R634 Abnormal weight loss: Secondary | ICD-10-CM

## 2017-07-26 DIAGNOSIS — C786 Secondary malignant neoplasm of retroperitoneum and peritoneum: Secondary | ICD-10-CM

## 2017-07-26 DIAGNOSIS — R21 Rash and other nonspecific skin eruption: Secondary | ICD-10-CM | POA: Diagnosis not present

## 2017-07-26 DIAGNOSIS — C762 Malignant neoplasm of abdomen: Secondary | ICD-10-CM

## 2017-07-26 DIAGNOSIS — D500 Iron deficiency anemia secondary to blood loss (chronic): Secondary | ICD-10-CM

## 2017-07-26 DIAGNOSIS — C189 Malignant neoplasm of colon, unspecified: Secondary | ICD-10-CM

## 2017-07-26 DIAGNOSIS — D630 Anemia in neoplastic disease: Secondary | ICD-10-CM

## 2017-07-26 DIAGNOSIS — K859 Acute pancreatitis without necrosis or infection, unspecified: Secondary | ICD-10-CM

## 2017-07-26 DIAGNOSIS — Z95828 Presence of other vascular implants and grafts: Secondary | ICD-10-CM

## 2017-07-26 LAB — COMPREHENSIVE METABOLIC PANEL
ALBUMIN: 3.3 g/dL — AB (ref 3.5–5.0)
ALK PHOS: 87 U/L (ref 40–150)
ALT: 6 U/L (ref 0–55)
ANION GAP: 8 meq/L (ref 3–11)
AST: 24 U/L (ref 5–34)
BILIRUBIN TOTAL: 0.23 mg/dL (ref 0.20–1.20)
BUN: 9.8 mg/dL (ref 7.0–26.0)
CALCIUM: 9.3 mg/dL (ref 8.4–10.4)
CO2: 25 mEq/L (ref 22–29)
Chloride: 102 mEq/L (ref 98–109)
Creatinine: 1 mg/dL (ref 0.7–1.3)
Glucose: 129 mg/dl (ref 70–140)
Potassium: 3.3 mEq/L — ABNORMAL LOW (ref 3.5–5.1)
Sodium: 135 mEq/L — ABNORMAL LOW (ref 136–145)
TOTAL PROTEIN: 8.2 g/dL (ref 6.4–8.3)

## 2017-07-26 LAB — CBC WITH DIFFERENTIAL/PLATELET
BASO%: 1.6 % (ref 0.0–2.0)
Basophils Absolute: 0.1 10*3/uL (ref 0.0–0.1)
EOS%: 3.3 % (ref 0.0–7.0)
Eosinophils Absolute: 0.1 10*3/uL (ref 0.0–0.5)
HCT: 30.3 % — ABNORMAL LOW (ref 38.4–49.9)
HEMOGLOBIN: 9.9 g/dL — AB (ref 13.0–17.1)
LYMPH#: 1.4 10*3/uL (ref 0.9–3.3)
LYMPH%: 33.7 % (ref 14.0–49.0)
MCH: 28.5 pg (ref 27.2–33.4)
MCHC: 32.7 g/dL (ref 32.0–36.0)
MCV: 87.3 fL (ref 79.3–98.0)
MONO#: 0.6 10*3/uL (ref 0.1–0.9)
MONO%: 13.3 % (ref 0.0–14.0)
NEUT%: 48.1 % (ref 39.0–75.0)
NEUTROS ABS: 2.1 10*3/uL (ref 1.5–6.5)
PLATELETS: 131 10*3/uL — AB (ref 140–400)
RBC: 3.47 10*6/uL — ABNORMAL LOW (ref 4.20–5.82)
RDW: 14.7 % — ABNORMAL HIGH (ref 11.0–14.6)
WBC: 4.3 10*3/uL (ref 4.0–10.3)

## 2017-07-26 LAB — FERRITIN: FERRITIN: 361 ng/mL — AB (ref 22–316)

## 2017-07-26 LAB — IRON AND TIBC
%SAT: 17 % — ABNORMAL LOW (ref 20–55)
IRON: 38 ug/dL — AB (ref 42–163)
TIBC: 226 ug/dL (ref 202–409)
UIBC: 188 ug/dL (ref 117–376)

## 2017-07-26 LAB — CEA (IN HOUSE-CHCC): CEA (CHCC-In House): 4.24 ng/mL (ref 0.00–5.00)

## 2017-07-26 LAB — MAGNESIUM: MAGNESIUM: 1.8 mg/dL (ref 1.5–2.5)

## 2017-07-26 MED ORDER — SODIUM CHLORIDE 0.9% FLUSH
10.0000 mL | INTRAVENOUS | Status: DC | PRN
  Administered 2017-07-26: 10 mL via INTRAVENOUS
  Filled 2017-07-26: qty 10

## 2017-07-26 MED ORDER — OXYCODONE HCL 10 MG PO TABS: 10.0000 mg | ORAL_TABLET | Freq: Four times a day (QID) | ORAL | 0 refills | Status: DC | PRN

## 2017-07-26 MED ORDER — HEPARIN SOD (PORK) LOCK FLUSH 100 UNIT/ML IV SOLN
500.0000 [IU] | INTRAVENOUS | Status: DC | PRN
  Administered 2017-07-26: 500 [IU] via INTRAVENOUS
  Filled 2017-07-26: qty 5

## 2017-07-26 MED FILL — oxyCODONE HCL 10 MG TABS: 10 | 22 days supply | Qty: 90 | Fill #0

## 2017-07-26 NOTE — Progress Notes (Signed)
[place] Work  Holiday representative met with patient and patient's daughter at [facility] to offer support and review applications for additional assistance.  CSW and patient reviewed application.  Patient will collecte requested information and return at his next appointment (8/2).  CSW encouraged patient to call with questions or concerns.    Johnnye Lana, MSW, LCSW, OSW-C Clinical Social Worker [facility] [phone]
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
## 2017-07-26 NOTE — Telephone Encounter (Signed)
Spoke with patient and he is aware of his 8/20 chemo appts

## 2017-07-27 ENCOUNTER — Telehealth: Payer: Self-pay

## 2017-07-27 NOTE — Telephone Encounter (Signed)
Tc to client to check on him past his discharge from hospital. States he is fine ,at his friends house ,friend picked him up and his truck was at his  [employer]. States he feels fine ,no problems past  Discharge . Will have several follow up appointments next week as they will be checking on his drain and will see his oncology MD. All appointments  He will be called as to the time and date.  Client knows to call HOPES team if he feels we can assist him with any things. Daughter will be helping but she is working during the day . Very independent ,tries to manage on his on . Follow up  By SW next week .

## 2017-07-27 NOTE — Congregational Nurse Program (Signed)
Congregational Nurse Program Note  Date of Encounter: 07/27/2017  Past Medical History: Past Medical History:  Diagnosis Date  . Acute deep vein thrombosis (DVT) of right lower extremity ([place]) 02/18/2017  . Malignant neoplasm of abdomen ([place])    [name] 01/11/2017  . Microcytic anemia    /notes 01/11/2017    Encounter Details:     CNP Questionnaire - 07/27/17 1837      Patient Demographics   Is this a new or existing patient? Existing   Patient is considered a/an Not Applicable   Race African-American/Black     Patient Assistance   Location of Patient Assistance HOPES patient   Patient's financial/insurance status Medicaid   Uninsured Patient ([place] [employer]s) No   Patient referred to apply for the following financial assistance Not Applicable   Food insecurities addressed Not Applicable   Transportation assistance No   Assistance securing medications No   Educational health offerings Cancer;Medications;Nutrition;Safety;Chronic disease     Encounter Details   Primary purpose of visit Spiritual Care/Support Visit;Education/Health Concerns;Chronic Illness/Condition Visit;Post PCP Visit;Post ED/Hospitalization Visit   Was an Emergency Department visit averted? Not Applicable   Does patient have a medical provider? Yes   Patient referred to Follow up with established PCP   Was a mental health screening completed? (GAINS tool) No   Does patient have dental issues? No   Does patient have vision issues? No   Does your patient have an abnormal blood pressure today? No   Since previous encounter, have you referred patient for abnormal blood pressure that resulted in a new diagnosis or medication change? No   Does your patient have an abnormal blood glucose today? No   Since previous encounter, have you referred patient for abnormal blood glucose that resulted in a new diagnosis or medication change? No   Was there a life-saving intervention made? No      HOPES viist  To  check on client past his  Recent hospitalization . Client up and about this am ,but  Came home for HOPES visit . He is looking  Good ,moving around good ,driving himself . States he saw his oncology MD on yesterday ,his  Drain is  Still in place  And doing well . Has kept all of his  follow up appointments .   Met with potential housing Secondary school teacher and completed paper work ,feels he will get this apartment but will know  Definitely  By 8-15 if things  go well .Marland Kitchen Chemo to be delayed  For 3-4 weeks until his  Body gets  Stronger and healing past surgery . No pain he states ,taking his medications ,no side effects . Eating and drinking with out any problems ,encouraged to eat well   drink fluids .Client  Anxious to get his  own place . Will follow  Weekly  And  Monitor housing possibility and complete  [place]  Application once  Apartment space is secured. .   SW to  Continue to work with  Daughter on housing  Issues.  Follow  Weekly ,will call to arrange next visit as client may  Be  Called in for  Other MD appointments  He states.

## 2017-07-29 ENCOUNTER — Other Ambulatory Visit: Payer: Medicaid Other

## 2017-07-29 ENCOUNTER — Emergency Department (HOSPITAL_COMMUNITY): Payer: Medicaid Other

## 2017-07-29 ENCOUNTER — Encounter (HOSPITAL_COMMUNITY): Payer: Self-pay | Admitting: Emergency Medicine

## 2017-07-29 ENCOUNTER — Inpatient Hospital Stay (HOSPITAL_COMMUNITY)
Admission: EM | Admit: 2017-07-29 | Discharge: 2017-08-17 | DRG: 329 | Disposition: A | Discharge status: Skilled Nursing Facility | Payer: Medicaid Other | Attending: Internal Medicine | Admitting: Internal Medicine

## 2017-07-29 DIAGNOSIS — D61810 Antineoplastic chemotherapy induced pancytopenia: Secondary | ICD-10-CM | POA: Diagnosis present

## 2017-07-29 DIAGNOSIS — C786 Secondary malignant neoplasm of retroperitoneum and peritoneum: Secondary | ICD-10-CM | POA: Diagnosis present

## 2017-07-29 DIAGNOSIS — E871 Hypo-osmolality and hyponatremia: Secondary | ICD-10-CM | POA: Diagnosis not present

## 2017-07-29 DIAGNOSIS — K859 Acute pancreatitis without necrosis or infection, unspecified: Secondary | ICD-10-CM | POA: Diagnosis present

## 2017-07-29 DIAGNOSIS — C189 Malignant neoplasm of colon, unspecified: Secondary | ICD-10-CM

## 2017-07-29 DIAGNOSIS — K56601 Complete intestinal obstruction, unspecified as to cause: Secondary | ICD-10-CM

## 2017-07-29 DIAGNOSIS — I82501 Chronic embolism and thrombosis of unspecified deep veins of right lower extremity: Secondary | ICD-10-CM | POA: Diagnosis present

## 2017-07-29 DIAGNOSIS — Z86718 Personal history of other venous thrombosis and embolism: Secondary | ICD-10-CM | POA: Diagnosis not present

## 2017-07-29 DIAGNOSIS — K56609 Unspecified intestinal obstruction, unspecified as to partial versus complete obstruction: Secondary | ICD-10-CM | POA: Diagnosis present

## 2017-07-29 DIAGNOSIS — R140 Abdominal distension (gaseous): Secondary | ICD-10-CM

## 2017-07-29 DIAGNOSIS — K651 Peritoneal abscess: Secondary | ICD-10-CM | POA: Diagnosis present

## 2017-07-29 DIAGNOSIS — T451X5A Adverse effect of antineoplastic and immunosuppressive drugs, initial encounter: Secondary | ICD-10-CM | POA: Diagnosis present

## 2017-07-29 DIAGNOSIS — E440 Moderate protein-calorie malnutrition: Secondary | ICD-10-CM | POA: Diagnosis not present

## 2017-07-29 DIAGNOSIS — E876 Hypokalemia: Secondary | ICD-10-CM | POA: Diagnosis not present

## 2017-07-29 DIAGNOSIS — B9689 Other specified bacterial agents as the cause of diseases classified elsewhere: Secondary | ICD-10-CM | POA: Diagnosis present

## 2017-07-29 DIAGNOSIS — Z978 Presence of other specified devices: Secondary | ICD-10-CM

## 2017-07-29 DIAGNOSIS — Z7189 Other specified counseling: Secondary | ICD-10-CM | POA: Diagnosis not present

## 2017-07-29 DIAGNOSIS — Z681 Body mass index (BMI) 19 or less, adult: Secondary | ICD-10-CM

## 2017-07-29 DIAGNOSIS — E43 Unspecified severe protein-calorie malnutrition: Secondary | ICD-10-CM | POA: Diagnosis present

## 2017-07-29 DIAGNOSIS — C185 Malignant neoplasm of splenic flexure: Secondary | ICD-10-CM | POA: Diagnosis present

## 2017-07-29 DIAGNOSIS — I700 Atherosclerosis of aorta: Secondary | ICD-10-CM | POA: Diagnosis present

## 2017-07-29 DIAGNOSIS — C186 Malignant neoplasm of descending colon: Secondary | ICD-10-CM | POA: Diagnosis present

## 2017-07-29 DIAGNOSIS — R059 Cough, unspecified: Secondary | ICD-10-CM

## 2017-07-29 DIAGNOSIS — Z4803 Encounter for change or removal of drains: Secondary | ICD-10-CM

## 2017-07-29 DIAGNOSIS — D500 Iron deficiency anemia secondary to blood loss (chronic): Secondary | ICD-10-CM | POA: Diagnosis present

## 2017-07-29 DIAGNOSIS — K8689 Other specified diseases of pancreas: Secondary | ICD-10-CM | POA: Diagnosis not present

## 2017-07-29 DIAGNOSIS — Z7901 Long term (current) use of anticoagulants: Secondary | ICD-10-CM

## 2017-07-29 DIAGNOSIS — R64 Cachexia: Secondary | ICD-10-CM | POA: Diagnosis present

## 2017-07-29 DIAGNOSIS — E860 Dehydration: Secondary | ICD-10-CM | POA: Diagnosis present

## 2017-07-29 DIAGNOSIS — K567 Ileus, unspecified: Secondary | ICD-10-CM | POA: Diagnosis not present

## 2017-07-29 DIAGNOSIS — C7889 Secondary malignant neoplasm of other digestive organs: Secondary | ICD-10-CM | POA: Diagnosis present

## 2017-07-29 DIAGNOSIS — R188 Other ascites: Secondary | ICD-10-CM | POA: Diagnosis present

## 2017-07-29 DIAGNOSIS — D509 Iron deficiency anemia, unspecified: Secondary | ICD-10-CM | POA: Diagnosis not present

## 2017-07-29 DIAGNOSIS — Z0189 Encounter for other specified special examinations: Secondary | ICD-10-CM

## 2017-07-29 DIAGNOSIS — Z590 Homelessness: Secondary | ICD-10-CM

## 2017-07-29 DIAGNOSIS — Z809 Family history of malignant neoplasm, unspecified: Secondary | ICD-10-CM

## 2017-07-29 DIAGNOSIS — R05 Cough: Secondary | ICD-10-CM

## 2017-07-29 DIAGNOSIS — E41 Nutritional marasmus: Secondary | ICD-10-CM | POA: Diagnosis not present

## 2017-07-29 DIAGNOSIS — F329 Major depressive disorder, single episode, unspecified: Secondary | ICD-10-CM | POA: Diagnosis present

## 2017-07-29 DIAGNOSIS — Z515 Encounter for palliative care: Secondary | ICD-10-CM | POA: Diagnosis not present

## 2017-07-29 DIAGNOSIS — R109 Unspecified abdominal pain: Secondary | ICD-10-CM | POA: Diagnosis present

## 2017-07-29 DIAGNOSIS — R062 Wheezing: Secondary | ICD-10-CM

## 2017-07-29 DIAGNOSIS — D630 Anemia in neoplastic disease: Secondary | ICD-10-CM | POA: Diagnosis present

## 2017-07-29 DIAGNOSIS — K565 Intestinal adhesions [bands], unspecified as to partial versus complete obstruction: Secondary | ICD-10-CM | POA: Diagnosis not present

## 2017-07-29 LAB — HEPARIN LEVEL (UNFRACTIONATED)

## 2017-07-29 LAB — CBC
HEMATOCRIT: 35.2 % — AB (ref 39.0–52.0)
HEMOGLOBIN: 11.9 g/dL — AB (ref 13.0–17.0)
MCH: 28.4 pg (ref 26.0–34.0)
MCHC: 33.8 g/dL (ref 30.0–36.0)
MCV: 84 fL (ref 78.0–100.0)
Platelets: 187 10*3/uL (ref 150–400)
RBC: 4.19 MIL/uL — ABNORMAL LOW (ref 4.22–5.81)
RDW: 14.9 % (ref 11.5–15.5)
WBC: 16.5 10*3/uL — ABNORMAL HIGH (ref 4.0–10.5)

## 2017-07-29 LAB — I-STAT CHEM 8, ED
BUN: 13 mg/dL (ref 6–20)
CREATININE: 0.8 mg/dL (ref 0.61–1.24)
Calcium, Ion: 1.15 mmol/L (ref 1.15–1.40)
Chloride: 99 mmol/L — ABNORMAL LOW (ref 101–111)
GLUCOSE: 119 mg/dL — AB (ref 65–99)
HCT: 39 % (ref 39.0–52.0)
HEMOGLOBIN: 13.3 g/dL (ref 13.0–17.0)
POTASSIUM: 3.8 mmol/L (ref 3.5–5.1)
Sodium: 137 mmol/L (ref 135–145)
TCO2: 23 mmol/L (ref 0–100)

## 2017-07-29 LAB — COMPREHENSIVE METABOLIC PANEL
ALBUMIN: 3.7 g/dL (ref 3.5–5.0)
ALT: 10 U/L — ABNORMAL LOW (ref 17–63)
ANION GAP: 11 (ref 5–15)
AST: 23 U/L (ref 15–41)
Alkaline Phosphatase: 62 U/L (ref 38–126)
BUN: 14 mg/dL (ref 6–20)
CHLORIDE: 100 mmol/L — AB (ref 101–111)
CO2: 24 mmol/L (ref 22–32)
Calcium: 9.3 mg/dL (ref 8.9–10.3)
Creatinine, Ser: 0.79 mg/dL (ref 0.61–1.24)
GFR calc Af Amer: >60 mL/min (ref >60)
GFR calc non Af Amer: >60 mL/min (ref >60)
GLUCOSE: 118 mg/dL — AB (ref 65–99)
POTASSIUM: 3.7 mmol/L (ref 3.5–5.1)
SODIUM: 135 mmol/L (ref 135–145)
Total Bilirubin: 0.7 mg/dL (ref 0.3–1.2)
Total Protein: 9 g/dL — ABNORMAL HIGH (ref 6.5–8.1)

## 2017-07-29 LAB — LIPASE, BLOOD: LIPASE: 14 U/L (ref 11–51)

## 2017-07-29 LAB — APTT: aPTT: 32 seconds (ref 24–36)

## 2017-07-29 MED ORDER — PIPERACILLIN-TAZOBACTAM 3.375 G IVPB
3.3750 g | Freq: Three times a day (TID) | INTRAVENOUS | Status: DC
  Administered 2017-07-30 – 2017-08-04 (×17): 3.375 g via INTRAVENOUS
  Filled 2017-07-29 (×15): qty 50

## 2017-07-29 MED ORDER — FENTANYL CITRATE (PF) 100 MCG/2ML IJ SOLN
25.0000 ug | INTRAMUSCULAR | Status: DC | PRN
  Administered 2017-07-30: 25 ug via INTRAVENOUS
  Administered 2017-07-30: 50 ug via INTRAVENOUS
  Administered 2017-07-30: 25 ug via INTRAVENOUS
  Administered 2017-07-31 – 2017-08-04 (×27): 50 ug via INTRAVENOUS
  Filled 2017-07-29 (×30): qty 2

## 2017-07-29 MED ORDER — HEPARIN BOLUS VIA INFUSION
3000.0000 [IU] | Freq: Once | INTRAVENOUS | Status: AC
  Administered 2017-07-29: 3000 [IU] via INTRAVENOUS
  Filled 2017-07-29: qty 3000

## 2017-07-29 MED ORDER — ONDANSETRON HCL 4 MG/2ML IJ SOLN
4.0000 mg | Freq: Once | INTRAMUSCULAR | Status: AC
  Administered 2017-07-29: 4 mg via INTRAVENOUS
  Filled 2017-07-29: qty 2

## 2017-07-29 MED ORDER — SODIUM CHLORIDE 0.9 % IV SOLN
INTRAVENOUS | Status: DC
  Administered 2017-07-29: 13:00:00 via INTRAVENOUS

## 2017-07-29 MED ORDER — ONDANSETRON HCL 4 MG PO TABS: 4.0000 mg | ORAL_TABLET | Freq: Four times a day (QID) | ORAL | Status: DC | PRN

## 2017-07-29 MED ORDER — SODIUM CHLORIDE 0.9% FLUSH
10.0000 mL | Freq: Two times a day (BID) | INTRAVENOUS | Status: DC
Start: 2017-07-29 — End: 2017-08-17
  Administered 2017-08-02 (×2): 10 mL
  Administered 2017-08-03: 40 mL
  Administered 2017-08-05 – 2017-08-09 (×2): 10 mL
  Administered 2017-08-09: 20 mL
  Administered 2017-08-09 – 2017-08-17 (×8): 10 mL

## 2017-07-29 MED ORDER — SODIUM CHLORIDE 0.9 % IV BOLUS (SEPSIS)
1000.0000 mL | Freq: Once | INTRAVENOUS | Status: AC
  Administered 2017-07-29: 1000 mL via INTRAVENOUS

## 2017-07-29 MED ORDER — IOPAMIDOL (ISOVUE-300) INJECTION 61%
100.0000 mL | Freq: Once | INTRAVENOUS | Status: AC | PRN
  Administered 2017-07-29: 100 mL via INTRAVENOUS

## 2017-07-29 MED ORDER — PIPERACILLIN-TAZOBACTAM 3.375 G IVPB 30 MIN
3.3750 g | Freq: Once | INTRAVENOUS | Status: AC
  Administered 2017-07-29: 3.375 g via INTRAVENOUS
  Filled 2017-07-29: qty 50

## 2017-07-29 MED ORDER — SODIUM CHLORIDE 0.9 % IV BOLUS (SEPSIS): 500.0000 mL | Freq: Once | INTRAVENOUS | Status: DC

## 2017-07-29 MED ORDER — ACETAMINOPHEN 650 MG RE SUPP: 650.0000 mg | Freq: Four times a day (QID) | RECTAL | Status: DC | PRN

## 2017-07-29 MED ORDER — SODIUM CHLORIDE 0.9% FLUSH
10.0000 mL | INTRAVENOUS | Status: DC | PRN
Start: 2017-07-29 — End: 2017-08-17
  Administered 2017-08-02 – 2017-08-17 (×5): 10 mL
  Filled 2017-07-29 (×5): qty 40

## 2017-07-29 MED ORDER — ACETAMINOPHEN 325 MG PO TABS
650.0000 mg | ORAL_TABLET | Freq: Four times a day (QID) | ORAL | Status: DC | PRN
  Administered 2017-08-11 – 2017-08-12 (×3): 650 mg via ORAL
  Filled 2017-07-29 (×3): qty 2

## 2017-07-29 MED ORDER — HEPARIN (PORCINE) IN NACL 100-0.45 UNIT/ML-% IJ SOLN
800.0000 [IU]/h | INTRAMUSCULAR | Status: DC
  Administered 2017-07-29: 800 [IU]/h via INTRAVENOUS
  Filled 2017-07-29: qty 250

## 2017-07-29 MED ORDER — FENTANYL CITRATE (PF) 100 MCG/2ML IJ SOLN
100.0000 ug | Freq: Once | INTRAMUSCULAR | Status: AC
  Administered 2017-07-29: 100 ug via INTRAVENOUS
  Filled 2017-07-29: qty 2

## 2017-07-29 MED ORDER — ONDANSETRON HCL 4 MG/2ML IJ SOLN
4.0000 mg | Freq: Four times a day (QID) | INTRAMUSCULAR | Status: DC | PRN
  Administered 2017-07-30 – 2017-07-31 (×3): 4 mg via INTRAVENOUS
  Filled 2017-07-29 (×3): qty 2

## 2017-07-29 MED ORDER — SODIUM CHLORIDE 0.9 % IV SOLN
INTRAVENOUS | Status: AC
  Administered 2017-07-29: 18:00:00 via INTRAVENOUS

## 2017-07-29 NOTE — Progress Notes (Signed)
Pharmacy Antibiotic Note  Jeff [name] is a 56 y.o. male admitted on 07/29/2017 with sepsis.  Pharmacy has been consulted for Zosyn dosing. Estimated renal function >57ml/min.   Plan: Zosyn 3.375g IV q8h (4 hour infusion).  No dose adjustment anticipated- pharmacy to sign off.  Please re-consult if needed     Temp (24hrs), Avg:97.9 F (36.6 C), Min:97.6 F (36.4 C), Max:98.3 F (36.8 C)   Recent Labs Lab 07/26/17 1014 07/29/17 1122 07/29/17 1212  WBC 4.3 16.5*  --   CREATININE 1.0 0.79 0.80    Estimated Creatinine Clearance: 81 mL/min (by C-G formula based on SCr of 0.8 mg/dL).    No Known Allergies  Antimicrobials this admission: 8/2 Zosyn >>   Dose adjustments this admission:  Microbiology results:  Thank you for allowing pharmacy to be a part of this patient's care.  Biagio Borg 07/29/2017 5:29 PM

## 2017-07-29 NOTE — Progress Notes (Signed)
ANTICOAGULATION CONSULT NOTE - Initial Consult  Pharmacy Consult for Heparin Indication: DVT- bridge while off Xarelto  No Known Allergies  Patient Measurements:   Heparin Dosing Weight: 56.9 kg (act BW)  Vital Signs: Temp: 97.6 F (36.4 C) (08/02 1550) Temp Source: Oral (08/02 1550) BP: 128/99 (08/02 1600) Pulse Rate: 87 (08/02 1600)  Labs:  Recent Labs  07/29/17 1122 07/29/17 1212  HGB 11.9* 13.3  HCT 35.2* 39.0  PLT 187  --   CREATININE 0.79 0.80    Estimated Creatinine Clearance: 81 mL/min (by C-G formula based on SCr of 0.8 mg/dL).   Medical History: Past Medical History:  Diagnosis Date  . Acute deep vein thrombosis (DVT) of right lower extremity ([place]) 02/18/2017  . Malignant neoplasm of abdomen ([place])    [name] 01/11/2017  . Microcytic anemia    /notes 01/11/2017    Medications:  Infusions:  . sodium chloride    . piperacillin-tazobactam    . piperacillin-tazobactam (ZOSYN)  IV      Assessment: 56 yo M on chronic Xarelto which is being held for SBO & possible need for diverting colostomy. Patient recalls last taking Xarelto on Monday, July 30th.   Baseline aPTT & Heparin level WNL.   CBC- Hg and Pltc WNL.  No bleeding noted.   Goal of Therapy:  Heparin level 0.3-0.7 units/ml aPTT 66-102 seconds Monitor platelets by anticoagulation protocol: Yes   Plan:  Give 3000 units bolus x 1 Start heparin infusion at 800 units/hr Check anti-Xa level in 6 hours and daily while on heparin Continue to monitor H&H and platelets  Esdras Delair, Lavonia Drafts 07/29/2017,5:40 PM
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
## 2017-07-29 NOTE — ED Triage Notes (Signed)
Patient c/o generalized abdominal pain with N/V/D and decrease in appetite x3 days. Hx colon cancer.
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
## 2017-07-29 NOTE — ED Notes (Signed)
Pt is still unable to obtain a urinary sample, urinal empty.

## 2017-07-29 NOTE — ED Notes (Signed)
ED TO INPATIENT HANDOFF REPORT  Name/Age/Gender Jeff [name] 56 y.o. male  Home/SNF/Other Home  Chief Complaint weak     Code Status Orders        Start     Ordered   07/29/17 1737  Full code  Continuous     07/29/17 1738    Code Status History    Date Active Date Inactive Code Status Order ID Comments User Context   07/10/2017 12:41 AM 07/14/2017  4:35 PM Full Code [id number]  Ivor Costa, MD ED   01/11/2017  1:50 AM 01/19/2017  8:08 PM Full Code [id number]  Etta Quill, DO ED      Level of Care/Admitting Diagnosis ED Disposition    ED Disposition Condition [place] [facility] Area: [facility] [[id number]]  Level of Care: Med-Surg [16]  Diagnosis: Bowel obstruction [facility]) [[id number]]  Admitting Physician: Vianne Bulls [[id number]]  Attending Physician: Vianne Bulls [[id number]]  Estimated length of stay: past midnight tomorrow  Certification:: I certify this patient will need inpatient services for at least 2 midnights  PT Class (Do Not Modify): Inpatient [101]  PT Acc Code (Do Not Modify): Private [1]       Medical History Past Medical History:  Diagnosis Date  . Acute deep vein thrombosis (DVT) of right lower extremity ([place]) 02/18/2017  . Malignant neoplasm of abdomen ([place])    [name] 01/11/2017  . Microcytic anemia    /notes 01/11/2017    Allergies No Known Allergies  IV Location/Drains/Wounds Patient Lines/Drains/Airways Status   Active Line/Drains/Airways    Name:   Placement date:   Placement time:   Site:   Days:   Implanted Port 01/29/17 Right Chest  01/29/17        Chest    181   Closed System Drain 1 Left;Lateral Abdomen Bulb (JP) 10.2 Fr.  07/19/17    1719    Abdomen    10   NG/OG Tube Nasogastric 18 Fr. Left nare Xray;Aucultation Documented cm marking at nare/ corner of mouth  07/29/17    1544    Left nare    less than 1          Labs/Imaging Results for orders placed or performed during the hospital encounter of  07/29/17 (from the past 48 hour(s))  Lipase, blood     Status: None   Collection Time: 07/29/17 11:22 AM  Result Value Ref Range   Lipase 14 11 - 51 U/L  Comprehensive metabolic panel     Status: Abnormal   Collection Time: 07/29/17 11:22 AM  Result Value Ref Range   Sodium 135 135 - 145 mmol/L   Potassium 3.7 3.5 - 5.1 mmol/L   Chloride 100 (L) 101 - 111 mmol/L   CO2 24 22 - 32 mmol/L   Glucose, Bld 118 (H) 65 - 99 mg/dL   BUN 14 6 - 20 mg/dL   Creatinine, Ser 0.79 0.61 - 1.24 mg/dL   Calcium 9.3 8.9 - 10.3 mg/dL   Total Protein 9.0 (H) 6.5 - 8.1 g/dL   Albumin 3.7 3.5 - 5.0 g/dL   AST 23 15 - 41 U/L   ALT 10 (L) 17 - 63 U/L   Alkaline Phosphatase 62 38 - 126 U/L   Total Bilirubin 0.7 0.3 - 1.2 mg/dL   GFR calc non Af Amer >60 >60 mL/min   GFR calc Af Amer >60 >60 mL/min    Comment: (NOTE) The eGFR has been calculated  using the CKD EPI equation. This calculation has not been validated in all clinical situations. eGFR's persistently <60 mL/min signify possible Chronic Kidney Disease.    Anion gap 11 5 - 15  CBC     Status: Abnormal   Collection Time: 07/29/17 11:22 AM  Result Value Ref Range   WBC 16.5 (H) 4.0 - 10.5 K/uL   RBC 4.19 (L) 4.22 - 5.81 MIL/uL   Hemoglobin 11.9 (L) 13.0 - 17.0 g/dL   HCT 35.2 (L) 39.0 - 52.0 %   MCV 84.0 78.0 - 100.0 fL   MCH 28.4 26.0 - 34.0 pg   MCHC 33.8 30.0 - 36.0 g/dL   RDW 14.9 11.5 - 15.5 %   Platelets 187 150 - 400 K/uL  I-Stat Chem 8, ED     Status: Abnormal   Collection Time: 07/29/17 12:12 PM  Result Value Ref Range   Sodium 137 135 - 145 mmol/L   Potassium 3.8 3.5 - 5.1 mmol/L   Chloride 99 (L) 101 - 111 mmol/L   BUN 13 6 - 20 mg/dL   Creatinine, Ser 0.80 0.61 - 1.24 mg/dL   Glucose, Bld 119 (H) 65 - 99 mg/dL   Calcium, Ion 1.15 1.15 - 1.40 mmol/L   TCO2 23 0 - 100 mmol/L   Hemoglobin 13.3 13.0 - 17.0 g/dL   HCT 39.0 39.0 - 52.0 %   Dg Abdomen 1 View  Result Date: 07/29/2017 CLINICAL DATA:  NG tube placement. EXAM:  ABDOMEN - 1 VIEW COMPARISON:  KUB from same date. FINDINGS: Interval placement of NG tube which is looped in the gastric fundus. The tip is in the gastric antrum with the distal side port near the gastroesophageal junction. Drainage tube near the pancreatic tail is unchanged. Multiple dilated loops of small bowel are again noted. IMPRESSION: 1. Interval placement of an enteric tube which is looped in the gastric fundus, with the tip in the gastric antrum. The distal side port is near the gastroesophageal junction. Consider advancing or pulling back the tube slightly to move the side port away from the gastroesophageal junction. 2. Multiple dilated loops of small bowel again noted. Electronically Signed   By: [name] M.D.   On: 07/29/2017 16:41   Ct Abdomen Pelvis W Contrast  Result Date: 07/29/2017 CLINICAL DATA:  Abdominal pain and distension for the past 2 days. EXAM: CT ABDOMEN AND PELVIS WITH CONTRAST TECHNIQUE: Multidetector CT imaging of the abdomen and pelvis was performed using the standard protocol following bolus administration of intravenous contrast. CONTRAST:  152m ISOVUE-300 IOPAMIDOL (ISOVUE-300) INJECTION 61% COMPARISON:  Abdomen radiographs obtained earlier today. Abdomen and pelvis CT dated 07/10/2017 and 01/10/2017. FINDINGS: Lower chest: Mild right lower lobe dependent atelectasis and minimal left lower lobe dependent atelectasis or scarring. Hepatobiliary: 8 mm diffusely enhancing mass in the dome of the liver on the right. This is better defined than when initially seen on 01/10/2017, with no change in size. Unremarkable gallbladder. Pancreas: The multiloculated fluid collection between the tail of the pancreas and splenic hilum and extending into the spleen currently measures 4.9 x 3.1 cm on image number 21 of series 2. This measured 5.8 x 3.8 cm on 07/10/2017 prior to percutaneous drainage. The percutaneous drainage catheter placed on 07/11/2017 is located inferior to this  collection, at the location of a fluid and gas collection seen on 07/10/2017. That fluid and gas collection is no longer present. Spleen: Multiloculated fluid collection within the spleen at the hilum and extending to the  pancreatic tail, described above. Additional linear areas of low density in the spleen are slightly less prominent than seen on 07/10/2017. Adrenals/Urinary Tract: Adrenal glands are unremarkable. Kidneys are normal, without renal calculi, focal lesion, or hydronephrosis. Bladder is unremarkable. Stomach/Bowel: Interval markedly dilated, fluid-filled loops of small bowel. There is also dilated and fluid-filled right and transverse colon. There is a similar-appearing dilated appendix filled with fluid, measuring 9 mm in maximum diameter. No periappendiceal soft tissue stranding. There is mild diffuse transverse colon wall thickening with more pronounced wall thickening and luminal narrowing at the level of the splenic flexure and proximal descending colon, adjacent to the drainage catheter. There is prominent stool in the descending colon distal to that level as well as in the sigmoid colon with no rectal stool seen. Nasogastric tube tip in the proximal duodenum. Vascular/Lymphatic: Atheromatous arterial calcifications without aneurysm. No enlarged lymph nodes. Reproductive: Moderately enlarged prostate gland. Other: Small amount of free peritoneal fluid. No free peritoneal air. Musculoskeletal: Lumbar and lower thoracic spine degenerative changes. Old, healed right rib fracture. Hardware fixation of the proximal right femur. IMPRESSION: 1. Bowel obstruction at the level of the proximal descending colon at the location of a percutaneous drainage catheter. This could be secondary to wall thickening by the adjacent inflammatory process. Wall thickening due to colon neoplasm also remains a possibility. 2. Dilated, fluid-filled appendix, similar to the dilated loops of colon and small bowel, compatible  with dilatation due to the bowel obstruction. There are no findings to indicate appendicitis. 3. Small amount of free peritoneal fluid. 4. The percutaneously drained fluid collection in the left mid abdomen has with resolved. 5. Mild decrease in size of the multiloculated fluid collection in the splenic hilum and distal tail of the pancreas. 6. Stable 8 mm diffusely enhancing mass in the dome of the liver on the right. This is most likely benign. A solitary enhancing metastasis is less likely. 7. Moderate prostatic hypertrophy. Electronically Signed   By: Claudie Revering M.D.   On: 07/29/2017 17:25   Dg Abd Acute W/chest  Result Date: 07/29/2017 CLINICAL DATA:  Generalized abdominal pain with nausea, vomiting and diarrhea. Decreased appetite. EXAM: DG ABDOMEN ACUTE W/ 1V CHEST COMPARISON:  CT 07/11/2017 FINDINGS: There are dilated fluid and air-filled loops of small intestine suggesting high-grade partial small bowel obstruction. Drainage catheter in the pancreatic tail region. No free air. Heart size is normal. Tortuous aorta. Power port on the right with tip in the SVC 2 cm above the right atrium. Lungs clear. IMPRESSION: High-grade partial small bowel obstruction pattern without free air. Electronically Signed   By: Nelson Chimes M.D.   On: 07/29/2017 13:07    Pending Labs Unresulted Labs    Start     Ordered   07/30/17 0500  CBC  Tomorrow morning,   R     07/29/17 1725   07/30/17 0500  Comprehensive metabolic panel  Tomorrow morning,   R     07/29/17 1725   07/30/17 0500  Magnesium  Tomorrow morning,   R     07/29/17 1725   07/29/17 1742  Heparin level (unfractionated)  STAT,   R     07/29/17 1743   07/29/17 1742  APTT  STAT,   R     07/29/17 1743   07/29/17 1122  Urinalysis, Routine w reflex microscopic  STAT,   STAT     07/29/17 1121      Isolation Precautions No active isolations  Vitals/Pain Today's Vitals  07/29/17 1400 07/29/17 1537 07/29/17 1550 07/29/17 1600  BP: (!) 123/99 (!)  143/99 (!) 127/102 (!) 128/99  Pulse: 90 89 84 87  Resp: 12  12   Temp: 97.9 F (36.6 C)  97.6 F (36.4 C)   TempSrc: Oral  Oral   SpO2: 100%  98%     Medications Medications  piperacillin-tazobactam (ZOSYN) IVPB 3.375 g (not administered)  piperacillin-tazobactam (ZOSYN) IVPB 3.375 g (not administered)  0.9 %  sodium chloride infusion (not administered)  acetaminophen (TYLENOL) tablet 650 mg (not administered)    Or  acetaminophen (TYLENOL) suppository 650 mg (not administered)  ondansetron (ZOFRAN) tablet 4 mg (not administered)    Or  ondansetron (ZOFRAN) injection 4 mg (not administered)  fentaNYL (SUBLIMAZE) injection 25-50 mcg (not administered)  sodium chloride 0.9 % bolus 1,000 mL (0 mLs Intravenous Stopped 07/29/17 1330)  ondansetron (ZOFRAN) injection 4 mg (4 mg Intravenous Given 07/29/17 1204)  fentaNYL (SUBLIMAZE) injection 100 mcg (100 mcg Intravenous Given 07/29/17 1310)  ondansetron (ZOFRAN) injection 4 mg (4 mg Intravenous Given 07/29/17 1309)  iopamidol (ISOVUE-300) 61 % injection 100 mL (100 mLs Intravenous Contrast Given 07/29/17 1642)    Mobility non-ambulatory
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
## 2017-07-30 ENCOUNTER — Inpatient Hospital Stay (HOSPITAL_COMMUNITY): Payer: Medicaid Other

## 2017-07-30 DIAGNOSIS — E440 Moderate protein-calorie malnutrition: Secondary | ICD-10-CM | POA: Insufficient documentation

## 2017-07-30 LAB — CBC
HEMATOCRIT: 29.5 % — AB (ref 39.0–52.0)
Hemoglobin: 9.5 g/dL — ABNORMAL LOW (ref 13.0–17.0)
MCH: 27.9 pg (ref 26.0–34.0)
MCHC: 32.2 g/dL (ref 30.0–36.0)
MCV: 86.8 fL (ref 78.0–100.0)
Platelets: 129 10*3/uL — ABNORMAL LOW (ref 150–400)
RBC: 3.4 MIL/uL — ABNORMAL LOW (ref 4.22–5.81)
RDW: 15.2 % (ref 11.5–15.5)
WBC: 9.4 10*3/uL (ref 4.0–10.5)

## 2017-07-30 LAB — COMPREHENSIVE METABOLIC PANEL
ALT: 7 U/L — AB (ref 17–63)
AST: 16 U/L (ref 15–41)
Albumin: 3.1 g/dL — ABNORMAL LOW (ref 3.5–5.0)
Alkaline Phosphatase: 46 U/L (ref 38–126)
Anion gap: 7 (ref 5–15)
BUN: 13 mg/dL (ref 6–20)
CHLORIDE: 105 mmol/L (ref 101–111)
CO2: 26 mmol/L (ref 22–32)
CREATININE: 0.84 mg/dL (ref 0.61–1.24)
Calcium: 8.5 mg/dL — ABNORMAL LOW (ref 8.9–10.3)
GFR calc Af Amer: >60 mL/min (ref >60)
Glucose, Bld: 103 mg/dL — ABNORMAL HIGH (ref 65–99)
POTASSIUM: 3.9 mmol/L (ref 3.5–5.1)
Sodium: 138 mmol/L (ref 135–145)
Total Bilirubin: 0.8 mg/dL (ref 0.3–1.2)
Total Protein: 7.2 g/dL (ref 6.5–8.1)

## 2017-07-30 LAB — HEPARIN LEVEL (UNFRACTIONATED)
HEPARIN UNFRACTIONATED: 0.35 [IU]/mL (ref 0.30–0.70)
HEPARIN UNFRACTIONATED: 0.27 [IU]/mL — AB (ref 0.30–0.70)
HEPARIN UNFRACTIONATED: 0.36 [IU]/mL (ref 0.30–0.70)

## 2017-07-30 LAB — MAGNESIUM: MAGNESIUM: 1.7 mg/dL (ref 1.7–2.4)

## 2017-07-30 MED ORDER — PHENOL 1.4 % MT LIQD
1.0000 | OROMUCOSAL | Status: DC | PRN
  Administered 2017-07-30: 1 via OROMUCOSAL
  Filled 2017-07-30: qty 177

## 2017-07-30 MED ORDER — KCL IN DEXTROSE-NACL 20-5-0.9 MEQ/L-%-% IV SOLN
INTRAVENOUS | Status: DC
  Administered 2017-07-30 – 2017-08-03 (×8): via INTRAVENOUS
  Filled 2017-07-30 (×12): qty 1000

## 2017-07-30 MED ORDER — HEPARIN (PORCINE) IN NACL 100-0.45 UNIT/ML-% IJ SOLN
1150.0000 [IU]/h | INTRAMUSCULAR | Status: DC
  Administered 2017-07-30 – 2017-08-01 (×3): 1000 [IU]/h via INTRAVENOUS
  Administered 2017-08-02 – 2017-08-03 (×2): 1150 [IU]/h via INTRAVENOUS
  Filled 2017-07-30 (×6): qty 250

## 2017-07-30 MED ORDER — DIATRIZOATE MEGLUMINE & SODIUM 66-10 % PO SOLN
90.0000 mL | Freq: Once | ORAL | Status: AC
  Administered 2017-07-30: 90 mL via NASOGASTRIC
  Filled 2017-07-30: qty 90
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
## 2017-07-30 NOTE — Progress Notes (Signed)
Brief pharmacy note: IV heparin  See full note by Lynnell Jude, PharmD, for details. Briefly, this is a 10 y/oM on Xarelto PTA for hx of DVT, transitioned to IV heparin for SBO and possible need for diverting colostomy.   Heparin level at 2031 = 0.36 units/mL, therapeutic on heparin infusion at 1000 units/hr.  Hgb decreased to 9.5 and Pltc decreased to 129K  No bleeding or complications of therapy noted per nursing   Plan:  Continue heparin infusion at 1000 units/hr.  Re-check heparin level at 0230.  Daily CBC and heparin level.  Monitor closely for s/sx of bleeding.   Lindell Spar, PharmD, BCPS Pager: [phone] 07/30/2017 10:15 PM
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
## 2017-07-30 NOTE — Progress Notes (Signed)
Initial Nutrition Assessment  DOCUMENTATION CODES:   Severe malnutrition in context of chronic illness, Underweight  INTERVENTION:  - Diet advancement as medically feasible.  - RD will monitor for needs at follow-up.   NUTRITION DIAGNOSIS:   Malnutrition (severe) related to chronic illness, catabolic illness, cancer and cancer related treatments as evidenced by severe depletion of muscle mass, severe depletion of body fat, percent weight loss.  GOAL:   Patient will meet greater than or equal to 90% of their needs  MONITOR:   Diet advancement, Weight trends, Labs, I & O's  REASON FOR ASSESSMENT:   Malnutrition Screening Tool  ASSESSMENT:   56 y.o. 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 Patient reports that he was in his usual state until approximately 3 days ago when he noted the fairly sudden onset of pain in the mid abdomen with nausea. He went on to develop nonbloody nonbilious vomiting, had a couple loose stools, and has had loss of appetite.  Pt seen for MST. BMI indicates underweight status. Hx outlined above from H&P. Pt reports that d/t N/V, abdominal pain he was unable to keep anything down for the few days PTA. Prior to that time he had been experiencing decrease in appetite but is unsure when this began.   NGT in place. Per Surgery PA note today: CT scan suggesting obstruction is in the proximal descending colon. Initiated SBO protocol with administration of gastrografin to see if pt is fully obstructed, 8hr delay film pending.  Physical assessment shows moderate and severe muscle and moderate and severe fat wasting. Per chart review, pt has lost 6 lbs (4.5% body weight) in 20 days; this is significant  for time frame.  Medications reviewed. Labs reviewed; Ca: 8.5 mg/dL.  IVF: D5-NS-20 mEq KCl @ 75 mL/hr (206 kcal from dextrose)   Diet Order:  Diet NPO time specified  Skin:  Reviewed, no issues  Last BM:  PTA/unknown  Height:   Ht Readings from Last 1 Encounters:  07/26/17 5\' 9"  (1.753 m)    Weight:   Wt Readings from Last 1 Encounters:  07/26/17 125 lb 8 oz (56.9 kg)    Ideal Body Weight:  72.73 kg  BMI:  18.5 kg/m2  Estimated Nutritional Needs:   Kcal:  1880-2050 (33-36 kcal/kg)  Protein:  85-97 grams (1.5-1.7 grams/kg)  Fluid:  >/= 2 L/day  EDUCATION NEEDS:   No education needs identified at this time     Jarome Matin, MS, RD, LDN, CNSC Inpatient Clinical Dietitian Pager # [phone] After hours/weekend pager # [phone]

## 2017-07-30 NOTE — Progress Notes (Signed)
ANTICOAGULATION CONSULT NOTE - Follow Up Consult  Pharmacy Consult for Heparin Indication: DVT- bridge while off Xarelto  No Known Allergies  Patient Measurements:   Heparin Dosing Weight:   Vital Signs: Temp: 98.9 F (37.2 C) (08/03 0542) Temp Source: Oral (08/03 0542) BP: 131/93 (08/03 0542) Pulse Rate: 89 (08/03 0542)  Labs:  Recent Labs  07/29/17 1122 07/29/17 1212 07/29/17 1954 07/30/17 0525  HGB 11.9* 13.3  --  9.5*  HCT 35.2* 39.0  --  29.5*  PLT 187  --   --  129*  APTT  --   --  32  --   HEPARINUNFRC  --   --  <0.10* 0.35  CREATININE 0.79 0.80  --  0.84    Estimated Creatinine Clearance: 77.1 mL/min (by C-G formula based on SCr of 0.84 mg/dL).   Medications:  Infusions:  . heparin 800 Units/hr (07/29/17 2217)  . piperacillin-tazobactam (ZOSYN)  IV 3.375 g (07/30/17 0532)    Assessment: Patient with heparin level at goal.  No heparin issue noted.    Goal of Therapy:  Heparin level 0.3-0.7 units/ml Monitor platelets by anticoagulation protocol: Yes   Plan:  Continue heparin drip at current rate Recheck level at [place], [place] 07/30/2017,6:48 AM
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
## 2017-07-30 NOTE — Progress Notes (Signed)
ANTICOAGULATION CONSULT NOTE - Initial Consult  Pharmacy Consult for Heparin Indication: DVT- bridge while off Xarelto  No Known Allergies  Patient Measurements:   Heparin Dosing Weight: 56.9 kg (act BW)  Vital Signs: Temp: 98.9 F (37.2 C) (08/03 0542) Temp Source: Oral (08/03 0542) BP: 131/93 (08/03 0542) Pulse Rate: 89 (08/03 0542)  Labs:  Recent Labs  07/29/17 1122 07/29/17 1212 07/29/17 1954 07/30/17 0525 07/30/17 1152  HGB 11.9* 13.3  --  9.5*  --   HCT 35.2* 39.0  --  29.5*  --   PLT 187  --   --  129*  --   APTT  --   --  32  --   --   HEPARINUNFRC  --   --  <0.10* 0.35 0.27*  CREATININE 0.79 0.80  --  0.84  --    Estimated Creatinine Clearance: 77.1 mL/min (by C-G formula based on SCr of 0.84 mg/dL).  Medical History: Past Medical History:  Diagnosis Date  . Acute deep vein thrombosis (DVT) of right lower extremity ([place]) 02/18/2017  . Malignant neoplasm of abdomen ([place])    Jeff [name] 01/11/2017  . Microcytic anemia    /notes 01/11/2017    Medications:  Infusions:  . dextrose 5 % and 0.9 % NaCl with KCl 20 mEq/L    . heparin    . piperacillin-tazobactam (ZOSYN)  IV Stopped (07/30/17 0932)   Assessment: 56 yo M on chronic Xarelto which is being held for SBO & possible need for diverting colostomy. Patient recalls last taking Xarelto on Monday, July 30th.   Baseline aPTT & Heparin level WNL.   CBC- Hg and Pltc WNL.  No bleeding noted.   Today, 07/30/2017  First Hep level this am in range ( 0.35 units/ml)  H/H, Plt decreased from admit, likely dilutional  Second Heparin level of 0.27 units/ml below therapeutic range  Goal of Therapy:  Heparin level 0.3-0.7 units/ml aPTT 66-102 seconds Monitor platelets by anticoagulation protocol: Yes   Plan:   Increase Heparin infusion to 1000 units/hr  Repeat Hep level at 2000 tonight  Daily CBC ordered, Daily Heparin level at steady state  Monitor for signs of bleed  Thank you,   Minda Ditto  PharmD Pager [phone] 07/30/2017, 2:06 PM

## 2017-07-31 DIAGNOSIS — K56609 Unspecified intestinal obstruction, unspecified as to partial versus complete obstruction: Secondary | ICD-10-CM

## 2017-07-31 DIAGNOSIS — E43 Unspecified severe protein-calorie malnutrition: Secondary | ICD-10-CM

## 2017-07-31 DIAGNOSIS — E41 Nutritional marasmus: Secondary | ICD-10-CM

## 2017-07-31 LAB — BASIC METABOLIC PANEL
Anion gap: 7 (ref 5–15)
BUN: 9 mg/dL (ref 6–20)
CO2: 24 mmol/L (ref 22–32)
CREATININE: 0.66 mg/dL (ref 0.61–1.24)
Calcium: 8.8 mg/dL — ABNORMAL LOW (ref 8.9–10.3)
Chloride: 111 mmol/L (ref 101–111)
GFR calc Af Amer: >60 mL/min (ref >60)
Glucose, Bld: 113 mg/dL — ABNORMAL HIGH (ref 65–99)
POTASSIUM: 3.4 mmol/L — AB (ref 3.5–5.1)
Sodium: 142 mmol/L (ref 135–145)

## 2017-07-31 LAB — CBC
HCT: 28.8 % — ABNORMAL LOW (ref 39.0–52.0)
Hemoglobin: 9.6 g/dL — ABNORMAL LOW (ref 13.0–17.0)
MCH: 28.2 pg (ref 26.0–34.0)
MCHC: 33.3 g/dL (ref 30.0–36.0)
MCV: 84.7 fL (ref 78.0–100.0)
PLATELETS: 114 10*3/uL — AB (ref 150–400)
RBC: 3.4 MIL/uL — ABNORMAL LOW (ref 4.22–5.81)
RDW: 14.9 % (ref 11.5–15.5)
WBC: 7.5 10*3/uL (ref 4.0–10.5)

## 2017-07-31 LAB — HEPARIN LEVEL (UNFRACTIONATED): Heparin Unfractionated: 0.48 IU/mL (ref 0.30–0.70)

## 2017-07-31 MED ORDER — POTASSIUM CHLORIDE 10 MEQ/100ML IV SOLN
10.0000 meq | INTRAVENOUS | Status: AC
  Administered 2017-07-31 (×3): 10 meq via INTRAVENOUS
  Filled 2017-07-31 (×3): qty 100
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
## 2017-07-31 NOTE — Progress Notes (Signed)
ANTICOAGULATION CONSULT NOTE - Follow Up Consult  Pharmacy Consult for Heparin Indication: Bridge Xarelto PTA for hx of DVT  No Known Allergies  Patient Measurements:   Heparin Dosing Weight:   Vital Signs: Temp: 99.5 F (37.5 C) (08/04 0502) Temp Source: Oral (08/04 0502) BP: 137/87 (08/04 0502) Pulse Rate: 91 (08/04 0502)  Labs:  Recent Labs  07/29/17 1122 07/29/17 1212  07/29/17 1954 07/30/17 0525 07/30/17 1152 07/30/17 2031 07/31/17 0318  HGB 11.9* 13.3  --   --  9.5*  --   --   --   HCT 35.2* 39.0  --   --  29.5*  --   --   --   PLT 187  --   --   --  129*  --   --   --   APTT  --   --   --  32  --   --   --   --   HEPARINUNFRC  --   --   < > <0.10* 0.35 0.27* 0.36 0.48  CREATININE 0.79 0.80  --   --  0.84  --   --   --   < > = values in this interval not displayed.  Estimated Creatinine Clearance: 77.1 mL/min (by C-G formula based on SCr of 0.84 mg/dL).   Medications:  Infusions:  . dextrose 5 % and 0.9 % NaCl with KCl 20 mEq/L 75 mL/hr at 07/31/17 0508  . heparin 1,000 Units/hr (07/30/17 2117)  . piperacillin-tazobactam (ZOSYN)  IV 3.375 g (07/31/17 0500)    Assessment: Patient with heparin level at goal again.  No heparin issues noted.  Will recheck level again since will be > 24hr until next daily levels.  Goal of Therapy:  Heparin level 0.3-0.7 units/ml Monitor platelets by anticoagulation protocol: Yes   Plan:  Continue heparin drip at current rate Recheck level at 0900  Tyler Deis, Shea Stakes Crowford 07/31/2017,5:22 AM
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
## 2017-08-01 LAB — BASIC METABOLIC PANEL
ANION GAP: 8 (ref 5–15)
BUN: 6 mg/dL (ref 6–20)
CHLORIDE: 109 mmol/L (ref 101–111)
CO2: 26 mmol/L (ref 22–32)
Calcium: 9.1 mg/dL (ref 8.9–10.3)
Creatinine, Ser: 0.89 mg/dL (ref 0.61–1.24)
GFR calc non Af Amer: >60 mL/min (ref >60)
Glucose, Bld: 128 mg/dL — ABNORMAL HIGH (ref 65–99)
POTASSIUM: 3.8 mmol/L (ref 3.5–5.1)
SODIUM: 143 mmol/L (ref 135–145)

## 2017-08-01 LAB — HEPARIN LEVEL (UNFRACTIONATED): Heparin Unfractionated: 0.4 IU/mL (ref 0.30–0.70)

## 2017-08-01 LAB — CBC
HEMATOCRIT: 29.7 % — AB (ref 39.0–52.0)
Hemoglobin: 9.5 g/dL — ABNORMAL LOW (ref 13.0–17.0)
MCH: 27.6 pg (ref 26.0–34.0)
MCHC: 32 g/dL (ref 30.0–36.0)
MCV: 86.3 fL (ref 78.0–100.0)
Platelets: 104 10*3/uL — ABNORMAL LOW (ref 150–400)
RBC: 3.44 MIL/uL — AB (ref 4.22–5.81)
RDW: 14.8 % (ref 11.5–15.5)
WBC: 8.3 10*3/uL (ref 4.0–10.5)

## 2017-08-01 LAB — MAGNESIUM: MAGNESIUM: 1.7 mg/dL (ref 1.7–2.4)

## 2017-08-01 LAB — PREALBUMIN: Prealbumin: 6.4 mg/dL — ABNORMAL LOW (ref 18–38)

## 2017-08-01 MED ORDER — MAGNESIUM SULFATE 2 GM/50ML IV SOLN
2.0000 g | Freq: Once | INTRAVENOUS | Status: AC
  Administered 2017-08-01: 2 g via INTRAVENOUS
  Filled 2017-08-01: qty 50

## 2017-08-01 NOTE — Progress Notes (Signed)
ANTICOAGULATION CONSULT NOTE -   Pharmacy Consult for Heparin Indication: DVT- bridge while off Xarelto  No Known Allergies  Patient Measurements: Height: 5\' 9"  (175.3 cm) Weight: 121 lb 14.6 oz (55.3 kg) IBW/kg (Calculated) : 70.7 Heparin Dosing Weight: 56.9 kg (act BW)  Vital Signs: Temp: 99.1 F (37.3 C) (08/05 0430) Temp Source: Oral (08/05 0430) BP: 140/99 (08/05 0430) Pulse Rate: 92 (08/05 0430)  Labs:  Recent Labs  07/29/17 1954 07/30/17 0525  07/30/17 2031 07/31/17 0318 07/31/17 0515 08/01/17 0443  HGB  --  9.5*  --   --   --  9.6* 9.5*  HCT  --  29.5*  --   --   --  28.8* 29.7*  PLT  --  129*  --   --   --  114* 104*  APTT 32  --   --   --   --   --   --   HEPARINUNFRC <0.10* 0.35  < > 0.36 0.48  --  0.40  CREATININE  --  0.84  --   --   --  0.66 0.89  < > = values in this interval not displayed. Estimated Creatinine Clearance: 70.8 mL/min (by C-G formula based on SCr of 0.89 mg/dL).  Medical History: Past Medical History:  Diagnosis Date  . Acute deep vein thrombosis (DVT) of right lower extremity ([place]) 02/18/2017  . 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 Microcytic anemia    /notes 01/11/2017    Medications:  Infusions:  . dextrose 5 % and 0.9 % NaCl with KCl 20 mEq/L 125 mL/hr at 08/01/17 0449  . heparin 1,000 Units/hr (07/31/17 2120)  . piperacillin-tazobactam (ZOSYN)  IV 3.375 g (08/01/17 0505)   Assessment: 56 yo M on chronic Xarelto which is being held for SBO & possible need for diverting colostomy. Patient recalls last taking Xarelto on Monday, July 30th.   Baseline aPTT & Heparin level WNL.   CBC- Hg and Pltc WNL.  No bleeding noted.   Today, 08/01/2017  Heparin level continues to be therapeutic  H/H,  decreased from admit, likely dilutional  plts low  No issues per RN  Goal of Therapy:  Heparin level 0.3-0.7 units/ml aPTT 66-102 seconds Monitor platelets by anticoagulation protocol: Yes   Plan:   continue  Heparin infusion at 1000 units/hr  Daily CBC ordered, Daily Heparin level   Monitor for signs of bleed  Thank you,   Dolly Rias RPh 08/01/2017, 7:46 AM Pager [phone]
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
## 2017-08-01 NOTE — Progress Notes (Signed)
Encouraged pt to walk patient during the night but pt refused saying he wanted to wait for his sister to come. I explained to pt that it is very important for him to walk.
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
## 2017-08-02 DIAGNOSIS — E440 Moderate protein-calorie malnutrition: Secondary | ICD-10-CM

## 2017-08-02 DIAGNOSIS — K8689 Other specified diseases of pancreas: Secondary | ICD-10-CM

## 2017-08-02 DIAGNOSIS — C786 Secondary malignant neoplasm of retroperitoneum and peritoneum: Secondary | ICD-10-CM

## 2017-08-02 DIAGNOSIS — C185 Malignant neoplasm of splenic flexure: Principal | ICD-10-CM

## 2017-08-02 DIAGNOSIS — K565 Intestinal adhesions [bands], unspecified as to partial versus complete obstruction: Secondary | ICD-10-CM

## 2017-08-02 DIAGNOSIS — C186 Malignant neoplasm of descending colon: Secondary | ICD-10-CM

## 2017-08-02 DIAGNOSIS — D630 Anemia in neoplastic disease: Secondary | ICD-10-CM

## 2017-08-02 LAB — HEPARIN LEVEL (UNFRACTIONATED)
HEPARIN UNFRACTIONATED: 0.58 [IU]/mL (ref 0.30–0.70)
Heparin Unfractionated: 0.17 IU/mL — ABNORMAL LOW (ref 0.30–0.70)
Heparin Unfractionated: 0.48 IU/mL (ref 0.30–0.70)

## 2017-08-02 LAB — BASIC METABOLIC PANEL
ANION GAP: 6 (ref 5–15)
BUN: <5 mg/dL — ABNORMAL LOW (ref 6–20)
CO2: 27 mmol/L (ref 22–32)
Calcium: 9 mg/dL (ref 8.9–10.3)
Chloride: 110 mmol/L (ref 101–111)
Creatinine, Ser: 0.76 mg/dL (ref 0.61–1.24)
GLUCOSE: 122 mg/dL — AB (ref 65–99)
POTASSIUM: 3.7 mmol/L (ref 3.5–5.1)
Sodium: 143 mmol/L (ref 135–145)

## 2017-08-02 LAB — MAGNESIUM: MAGNESIUM: 1.9 mg/dL (ref 1.7–2.4)

## 2017-08-02 LAB — CBC
HEMATOCRIT: 29.4 % — AB (ref 39.0–52.0)
HEMOGLOBIN: 9.5 g/dL — AB (ref 13.0–17.0)
MCH: 28.1 pg (ref 26.0–34.0)
MCHC: 32.3 g/dL (ref 30.0–36.0)
MCV: 87 fL (ref 78.0–100.0)
Platelets: 99 10*3/uL — ABNORMAL LOW (ref 150–400)
RBC: 3.38 MIL/uL — AB (ref 4.22–5.81)
RDW: 14.9 % (ref 11.5–15.5)
WBC: 6.2 10*3/uL (ref 4.0–10.5)

## 2017-08-02 MED ORDER — FLEET ENEMA 7-19 GM/118ML RE ENEM
1.0000 | ENEMA | Freq: Once | RECTAL | Status: DC
  Filled 2017-08-02: qty 1

## 2017-08-02 MED ORDER — HEPARIN BOLUS VIA INFUSION
1500.0000 [IU] | Freq: Once | INTRAVENOUS | Status: AC
  Administered 2017-08-02: 1500 [IU] via INTRAVENOUS
  Filled 2017-08-02: qty 1500

## 2017-08-02 MED ORDER — FLEET ENEMA 7-19 GM/118ML RE ENEM
1.0000 | ENEMA | Freq: Once | RECTAL | Status: AC
  Administered 2017-08-03: 1 via RECTAL

## 2017-08-02 NOTE — Consult Note (Signed)
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 project over the perineum and left ischium. There is contrast in the bladder. Metal hardware in the proximal right femur. Dilated small bowel loops have not significantly changed there is no definite free intraperitoneal gas. NG tube has retracted to the fundus of the stomach. IMPRESSION: Stable small bowel obstruction pattern. Electronically Signed   By: Marybelle Killings M.D.   On: 07/30/2017 08:19   Dg Abd Acute W/chest  Result Date: 07/29/2017 CLINICAL DATA:  Generalized abdominal pain with nausea, vomiting and diarrhea. Decreased appetite. EXAM: DG ABDOMEN ACUTE W/ 1V CHEST COMPARISON:  CT 07/11/2017 FINDINGS: There are dilated fluid and air-filled loops of small intestine suggesting high-grade partial small bowel obstruction. Drainage catheter in the pancreatic tail region. No free air. Heart size is normal. Tortuous aorta. Power port on the right with tip in the SVC 2 cm above the right atrium. Lungs clear. IMPRESSION: High-grade partial small bowel obstruction pattern without free air. Electronically Signed   By: Nelson Chimes M.D.   On: 07/29/2017 13:07   Dg Abd Portable 1v-small Bowel Obstruction Protocol-initial, 8 Hr Delay  Result Date: 07/30/2017 CLINICAL DATA:  56 y/o M; small-bowel obstruction protocol, 8 hour delay. EXAM: PORTABLE ABDOMEN - 1 VIEW  COMPARISON:  None. FINDINGS: There is persistent small bowel obstruction. Contrast is retained within the stomach and can be seen within loops of small bowel in the right hemiabdomen. There are non-opacified loops of bowel in the left hemiabdomen. Partially visualized right femur intramedullary nail. Degenerative changes of the lower lumbar spine. IMPRESSION: Persistent small bowel obstruction. Contrast is retained within stomach. Contrast can be seen in small bowel loops in right hemiabdomen. There are non-opacified loops of bowel in the left hemiabdomen. Electronically Signed   By: Kristine Garbe M.D.   On: 07/30/2017 19:18   Ct Image Guided Drainage By Percutaneous Catheter  Result Date: 07/11/2017 INDICATION: PANCREATIC TAIL ABSCESS EXAM: CT GUIDED DRAINAGE OF THE PANCREATIC TAIL ABSCESS MEDICATIONS: The patient is currently admitted to the hospital and receiving intravenous antibiotics. The antibiotics were administered within an appropriate time frame prior to the initiation of the procedure. ANESTHESIA/SEDATION: 1.0 mg IV Versed 50 mcg IV Fentanyl Moderate Sedation Time:  10 minutes The patient was continuously monitored during the procedure by the interventional radiology nurse under my direct supervision. COMPLICATIONS: None immediate. TECHNIQUE: Informed written consent was obtained from the patient after a thorough discussion of the procedural risks, benefits and alternatives. All questions were addressed. Maximal Sterile Barrier Technique was utilized including caps, mask, sterile gowns, sterile gloves, sterile drape, hand hygiene and skin antiseptic. A timeout was performed prior to the initiation of the procedure. PROCEDURE: Previous imaging reviewed. Patient positioned left anterior oblique. Initial noncontrast imaging performed through the abdomen. The pancreatic tail abscess  was difficult to visualize without contrast. Therefore 75 cc contrast administered IV. Repeat imaging performed  with contrast. The pancreatic tail abscess was localized. Overlying skin marked. Under sterile conditions and local anesthesia, an 18 gauge 10 cm access needle was advanced from a lower intercostal lateral approach just inferior to the spleen into the abscess. Needle position confirmed with CT. Syringe aspiration yielded purulent fluid. Sample sent for culture. Amplatz guidewire inserted followed by tract dilatation to insert a 10 [place] drain. Drain catheter position confirmed with CT. Catheter secured with a Prolene suture and connected to external gravity drainage. Sterile dressing applied. No immediate complication. Patient tolerated the procedure well. FINDINGS: CT imaging confirms needle placed within the inferior pancreatic tail abscess for drain insertion IMPRESSION: Successful CT-guided pancreatic tail abscess drain placement Electronically Signed   By: Jerilynn Mages.  Shick M.D.   On: 07/11/2017 11:12    Labs:  CBC:  Recent Labs  07/30/17 0525 07/31/17 0515 08/01/17 0443 08/02/17 0740  WBC 9.4 7.5 8.3 6.2  HGB 9.5* 9.6* 9.5* 9.5*  HCT 29.5* 28.8* 29.7* 29.4*  PLT 129* 114* 104* 99*    COAGS:  Recent Labs  01/11/17 1009 01/29/17 1256 07/11/17 0340 07/29/17 1954  INR 1.20 1.07 1.27  --   APTT 34 31  --  32    BMP:  Recent Labs  07/30/17 0525 07/31/17 0515 08/01/17 0443 08/02/17 0740  NA 138 142 143 143  K 3.9 3.4* 3.8 3.7  CL 105 111 109 110  CO2 26 24 26 27   GLUCOSE 103* 113* 128* 122*  BUN 13 9 6  <5*  CALCIUM 8.5* 8.8* 9.1 9.0  CREATININE 0.84 0.66 0.89 0.76  GFRNONAA >60 >60 >60 >60  GFRAA >60 >60 >60 >60    LIVER FUNCTION TESTS:  Recent Labs  07/11/17 0340 07/26/17 1014 07/29/17 1122 07/30/17 0525  BILITOT 1.0 0.23 0.7 0.8  AST 16 24 23 16   ALT 10* 6 10* 7*  ALKPHOS 42 87 62 46  PROT 6.4* 8.2 9.0* 7.2  ALBUMIN 2.2* 3.3* 3.7 3.1*    TUMOR MARKERS:  Recent Labs  01/11/17 0524 01/11/17 1009  AFPTM  --  1.3  CEA 10.7*  --   CA199 11 11     Assessment and Plan: Pancreatic abscess s/p drain placement 7/15 by Dr. [name], last exchanged 7/23 by Dr. Earleen Newport IR consulted for evaluation of drain and possible injection. Dr. Pascal Lux has discussed case with Dr. Redmond Pulling.   No drain injection at this time. Continue routine drain care.  IR to follow peripherally as patient currently without need of intervention.  IR is available if needed.  Patient to maintain follow-up with IR drain clinic as an outpatient if discharges home with drain in place.  Thank you for this interesting consult.  I greatly enjoyed meeting Jeff [name] and look forward to participating in their care.  A copy of this report was sent to the requesting provider on this date.  Electronically Signed: Docia Barrier, PA 08/02/2017, 4:33 PM   I spent a total of    10 Minutes in face to face in clinical consultation, greater than 50% of which was counseling/coordinating care for pancreatic abscess
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
## 2017-08-02 NOTE — Progress Notes (Signed)
[facility] Progress Note     Subjective: CC:  Reports persistent LLQ pain, temporarily relieved by pain medicine. Reports belching/hiccups around NGT. Denies flatus/BM.   Objective: Vital signs in last 24 hours: Temp:  [98.1 F (36.7 C)-98.7 F (37.1 C)] 98.1 F (36.7 C) (08/06 0533) Pulse Rate:  [80-92] 80 (08/06 0533) Resp:  [17-18] 17 (08/06 0533) BP: (136-143)/(100-107) 142/107 (08/06 0533) SpO2:  [99 %-100 %] 99 % (08/06 0533) Weight:  [55.6 kg (122 lb 9.2 oz)] 55.6 kg (122 lb 9.2 oz) (08/06 0533) Last BM Date: 07/28/17  Intake/Output from previous day: 08/05 0701 - 08/06 0700 In: 3427.2 [I.V.:3127.2; IV Piggyback:300] Out: 1380 [Urine:300; Emesis/NG output:1075; Drains:5] Intake/Output this shift: No intake/output data recorded.  PE: Gen:  Alert, NAD, pleasant HEENT: EOMs in tact, anicteric sclerae  Card:  Regular rate and rhythm, pedal pulses 2+ BL Pulm:  Normal effort, clear to auscultation bilaterally Abd: Soft, mild TTP LLQ, moderate distention, hypoactive bowel sounds Skin: warm and dry, no rashes  Psych: A&Ox3   Lab Results:   Recent Labs  07/31/17 0515 08/01/17 0443  WBC 7.5 8.3  HGB 9.6* 9.5*  HCT 28.8* 29.7*  PLT 114* 104*   BMET  Recent Labs  07/31/17 0515 08/01/17 0443  NA 142 143  K 3.4* 3.8  CL 111 109  CO2 24 26  GLUCOSE 113* 128*  BUN 9 6  CREATININE 0.66 0.89  CALCIUM 8.8* 9.1   CMP     Component Value Date/Time   NA 143 08/01/2017 0443   NA 135 (L) 07/26/2017 1014   K 3.8 08/01/2017 0443   K 3.3 (L) 07/26/2017 1014   CL 109 08/01/2017 0443   CO2 26 08/01/2017 0443   CO2 25 07/26/2017 1014   GLUCOSE 128 (H) 08/01/2017 0443   GLUCOSE 129 07/26/2017 1014   BUN 6 08/01/2017 0443   BUN 9.8 07/26/2017 1014   CREATININE 0.89 08/01/2017 0443   CREATININE 1.0 07/26/2017 1014   CALCIUM 9.1 08/01/2017 0443   CALCIUM 9.3 07/26/2017 1014   PROT 7.2 07/30/2017 0525   PROT 8.2 07/26/2017 1014   ALBUMIN 3.1 (L)  07/30/2017 0525   ALBUMIN 3.3 (L) 07/26/2017 1014   AST 16 07/30/2017 0525   AST 24 07/26/2017 1014   ALT 7 (L) 07/30/2017 0525   ALT 6 07/26/2017 1014   ALKPHOS 46 07/30/2017 0525   ALKPHOS 87 07/26/2017 1014   BILITOT 0.8 07/30/2017 0525   BILITOT 0.23 07/26/2017 1014   GFRNONAA >60 08/01/2017 0443   GFRAA >60 08/01/2017 0443   Lipase     Component Value Date/Time   LIPASE 14 07/29/2017 1122       Studies/Results: No results found.  Anti-infectives: Anti-infectives    Start     Dose/Rate Route Frequency Ordered Stop   07/29/17 2359  piperacillin-tazobactam (ZOSYN) IVPB 3.375 g     3.375 g 12.5 mL/hr over 240 Minutes Intravenous Every 8 hours 07/29/17 1729     07/29/17 1730  piperacillin-tazobactam (ZOSYN) IVPB 3.375 g     3.375 g 100 mL/hr over 30 Minutes Intravenous  Once 07/29/17 1728 07/29/17 1853       Assessment/Plan Colonic obstruction at the splenic flexure - Probably related to colon cancer; not having bowel function - Continue NGT - OOB/ambulate - [name] today  planning to attempt stent placement this afternoon around 1445 - Poor operative candidate - but if stent is not successful, will need palliative diverting ostomy  Metastatic adenocarcinoma  of descending colon- Diagnosed 01/12/2017; On palliative chemotx ([name]); Question need to involve palliative care. Pancreatic abscess - perc drain 07/11/2017 History of DVT, right leg - was on Xarelto (last dose 7/31 per patient),On heparin gtt Iron deficiency anemia  Malnutrition- prealbumin 6.4 - TNA DVT prophylaxis - IV Heparin FEN - NPO, NGT to LIWS, IVF, Mg 1.6 (replace IV); start TNA ID - Zosyn 8/3 >>   LOS: 4 days    Jill Alexanders , [facility] Surgery 08/02/2017, 7:36 AM Pager: [phone] Consults: [phone] Mon-Fri 7:00 am-4:30 pm Sat-Sun 7:00 am-11:30 am

## 2017-08-02 NOTE — Progress Notes (Signed)
ANTICOAGULATION CONSULT NOTE -   Pharmacy Consult for Heparin Indication: DVT- bridge while off Xarelto  No Known Allergies  Patient Measurements: Height: 5\' 9"  (175.3 cm) Weight: 122 lb 9.2 oz (55.6 kg) IBW/kg (Calculated) : 70.7 Heparin Dosing Weight: 56.9 kg (act BW)  Vital Signs: Temp: 98.4 F (36.9 C) (08/06 1327) Temp Source: Oral (08/06 1327) BP: 130/93 (08/06 1327) Pulse Rate: 89 (08/06 1327)  Labs:  Recent Labs  07/31/17 0515 08/01/17 0443 08/02/17 0616 08/02/17 0740 08/02/17 1353  HGB 9.6* 9.5*  --  9.5*  --   HCT 28.8* 29.7*  --  29.4*  --   PLT 114* 104*  --  99*  --   HEPARINUNFRC  --  0.40 0.17*  --  0.58  CREATININE 0.66 0.89  --  0.76  --    Estimated Creatinine Clearance: 79.2 mL/min (by C-G formula based on SCr of 0.76 mg/dL).  Medical History: Past Medical History:  Diagnosis Date  . Acute deep vein thrombosis (DVT) of right lower extremity ([place]) 02/18/2017  . Malignant neoplasm of abdomen ([place])    [name] 01/11/2017  . Microcytic anemia    /notes 01/11/2017    Medications:  Infusions:  . dextrose 5 % and 0.9 % NaCl with KCl 20 mEq/L 125 mL/hr at 08/02/17 0502  . heparin 1,150 Units/hr (08/02/17 0753)  . piperacillin-tazobactam (ZOSYN)  IV Stopped (08/02/17 6789)   Assessment: 56 yo M on chronic Xarelto which is being held for SBO & possible need for diverting colostomy. Patient recalls last taking Xarelto on Monday, July 30th.   Baseline aPTT & Heparin level WNL.   CBC- Hg and Pltc WNL.  No bleeding noted.   Today, 08/02/2017  Heparin level now therapeutic after bolus and increase in rate from this am  H/H low but stable  plts low and dropping  Per RN no issues   Goal of Therapy:  Heparin level 0.3-0.7 units/ml aPTT 66-102 seconds Monitor platelets by anticoagulation protocol: Yes   Plan:   continue heparin drip at 1150 units/hr  Heparin level in 6 hours  Daily CBC ordered, Daily Heparin level   Monitor for signs of  bleed  Thank you,   Dolly Rias RPh 08/02/2017, 3:02 PM Pager [phone]

## 2017-08-02 NOTE — Progress Notes (Signed)
Jeff [name]   [name]:05/13/1961   WL#:[id number]   RDE#:[id number]  ONCOLOGY FOLLOW UP NOTE   Subjective: I saw him for follow up today, he still on NG tube suction, with abdominal distention, pain is most controlled. Still feels weak.  Objective:  Vitals:   08/02/17 0839 08/02/17 1327  BP: (!) 131/101 (!) 130/93  Pulse: 82 89  Resp: 14 15  Temp:  98.4 F (36.9 C)    Body mass index is 18.1 kg/m.  Intake/Output Summary (Last 24 hours) at 08/02/17 2000 Last data filed at 08/02/17 1830  Gross per 24 hour  Intake           3406.9 ml  Output           1107.5 ml  Net           2299.4 ml     Sclerae unicteric  Oropharynx clear  No peripheral adenopathy  Lungs clear -- no rales or rhonchi  Heart regular rate and rhythm  Abdomen benign, mild tenderness at left side, (+) percutaneous drainage tube on left side.  MSK no focal spinal tenderness, no peripheral edema  Neuro nonfocal   CBG (last 3)  No results for input(s): GLUCAP in the last 72 hours.   Labs:  Urine Studies No results for input(s): UHGB, CRYS in the last 72 hours.  Invalid input(s): UACOL, UAPR, USPG, UPH, UTP, UGL, [place], UBIL, UNIT, UROB, [place], UEPI, UWBC, Duwayne Heck [place], [place]  Basic Metabolic Panel:  Recent Labs Lab 07/29/17 1122 07/29/17 1212 07/30/17 0525 07/31/17 0515 08/01/17 0443 08/02/17 0740  NA 135 137 138 142 143 143  K 3.7 3.8 3.9 3.4* 3.8 3.7  CL 100* 99* 105 111 109 110  CO2 24  --  26 24 26 27   GLUCOSE 118* 119* 103* 113* 128* 122*  BUN 14 13 13 9 6  <5*  CREATININE 0.79 0.80 0.84 0.66 0.89 0.76  CALCIUM 9.3  --  8.5* 8.8* 9.1 9.0  MG  --   --  1.7  --  1.7 1.9   GFR Estimated Creatinine Clearance: 79.2 mL/min (by C-G formula based on SCr of 0.76 mg/dL). Liver Function Tests:  Recent Labs Lab 07/29/17 1122 07/30/17 0525  AST 23 16  ALT 10* 7*  ALKPHOS 62 46  BILITOT 0.7 0.8  PROT 9.0* 7.2  ALBUMIN 3.7 3.1*    Recent Labs Lab 07/29/17 1122  LIPASE 14   No  results for input(s): AMMONIA in the last 168 hours. Coagulation profile No results for input(s): INR, PROTIME in the last 168 hours.  CBC:  Recent Labs Lab 07/29/17 1122 07/29/17 1212 07/30/17 0525 07/31/17 0515 08/01/17 0443 08/02/17 0740  WBC 16.5*  --  9.4 7.5 8.3 6.2  HGB 11.9* 13.3 9.5* 9.6* 9.5* 9.5*  HCT 35.2* 39.0 29.5* 28.8* 29.7* 29.4*  MCV 84.0  --  86.8 84.7 86.3 87.0  PLT 187  --  129* 114* 104* 99*   Cardiac Enzymes: No results for input(s): CKTOTAL, CKMB, CKMBINDEX, TROPONINI in the last 168 hours. BNP: Invalid input(s): POCBNP CBG: No results for input(s): GLUCAP in the last 168 hours. D-Dimer No results for input(s): DDIMER in the last 72 hours. Hgb A1c No results for input(s): HGBA1C in the last 72 hours. Lipid Profile No results for input(s): CHOL, HDL, LDLCALC, TRIG, CHOLHDL, LDLDIRECT in the last 72 hours. Thyroid function studies No results for input(s): TSH, T4TOTAL, T3FREE, THYROIDAB in the last 72 hours.  Invalid input(s): FREET3  Anemia work up No results for input(s): VITAMINB12, FOLATE, FERRITIN, TIBC, IRON, RETICCTPCT in the last 72 hours. Microbiology No results found for this or any previous visit (from the past 240 hour(s)).    Studies:  No results found.  Assessment: 56 y.o. with metastatic colon cancer, on palliative chemotherapy FOLFOX and panitumumab, last cycle on 07/06/2017, recent admission for abscess in the pancreatic tail, now admitted for bowel obstruction  1. Bowel obstruction due to his colon mass at the splenic flexure and surrounding inflammation due to recent abscess 2. Pancreatic tail abscess, status post drain 3. Metastatic adenocarcinoma of descending colon, with peritoneal metastasis, on palliative chemotherapy 4. History of DVT in right lower extremity, on Xarelto, changed to heparin now  5. 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 Moderate protein calorie malnutrition  Plan: -He is scheduled to  have colonoscopy by [name] who will attempt colonic stent placement for his bowel obstruction  -if stent is unsuccessful, he would probably need surgery. Giving his diffuse peritoneal metastasis, and recent pancreatic abscess, he is a poor candidate for extensive surgery. I will favor diverting colostomy. I do think he would benefit from this, since he only had one line chemo, and still has more systemic treatment options for his metastatic colon cancer.  -continue supportive care  -hold chemo for now -I will continue follow up. I will be out of office tomorrow, but will be available by phone if needed.   [name], MD 08/02/2017  8:00 PM  Cell: [phone]

## 2017-08-02 NOTE — Progress Notes (Signed)
Message sent to the hospitalist regarding the stop time of the heparin drip.  Dr. Hal Hope replied and ordered to stop heparin drip 3 hours before the stent procedure.

## 2017-08-02 NOTE — Progress Notes (Signed)
Patient ID: Jeff [name], male   [name]: 04/03/1961, 56 y.o.   MRN: [id number]   Acute Care Surgery Service Progress Note:    Chief Complaint/Subjective: No real c/o. No changes yesterday. Asks about drinking a soda  Objective: Vital signs in last 24 hours: Temp:  [98.1 F (36.7 C)-98.7 F (37.1 C)] 98.1 F (36.7 C) (08/06 0533) Pulse Rate:  [80-92] 82 (08/06 0839) Resp:  [14-18] 14 (08/06 0839) BP: (131-143)/(100-107) 131/101 (08/06 0839) SpO2:  [99 %-100 %] 99 % (08/06 0533) Weight:  [55.6 kg (122 lb 9.2 oz)] 55.6 kg (122 lb 9.2 oz) (08/06 0533) Last BM Date: 07/28/17  Intake/Output from previous day: 08/05 0701 - 08/06 0700 In: 3427.2 [I.V.:3127.2; IV Piggyback:300] Out: 1380 [Urine:300; Emesis/NG output:1075; Drains:5] Intake/Output this shift: Total I/O In: 0  Out: 102 [Emesis/NG output:100; Drains:2]  Lungs: cta, nonlabored; coughing up phlegm  Cardiovascular: reg  Abd: soft, distended, min TTP  Extremities: no edema, +SCDs  Neuro: alert, nonfocal  Gen:  Cachectic  Lab Results: CBC   Recent Labs  08/01/17 0443 08/02/17 0740  WBC 8.3 6.2  HGB 9.5* 9.5*  HCT 29.7* 29.4*  PLT 104* 99*   BMET  Recent Labs  08/01/17 0443 08/02/17 0740  NA 143 143  K 3.8 3.7  CL 109 110  CO2 26 27  GLUCOSE 128* 122*  BUN 6 <5*  CREATININE 0.89 0.76  CALCIUM 9.1 9.0   LFT Hepatic Function Latest Ref Rng & Units 07/30/2017 07/29/2017 07/26/2017  Total Protein 6.5 - 8.1 g/dL 7.2 9.0(H) 8.2  Albumin 3.5 - 5.0 g/dL 3.1(L) 3.7 3.3(L)  AST 15 - 41 U/L 16 23 24  ALT 17 - 63 U/L 7(L) 10(L) 6  Alk Phosphatase 38 - 126 U/L 46 62 87  Total Bilirubin 0.3 - 1.2 mg/dL 0.8 0.7 0.23  Bilirubin, Direct 0.0 - 0.3 mg/dL - - -   PT/INR No results for input(s): LABPROT, INR in the last 72 hours. ABG No results for input(s): PHART, HCO3 in the last 72 hours.  Invalid input(s): PCO2, PO2  Studies/Results:  Anti-infectives: Anti-infectives    Start     Dose/Rate Route  Frequency Ordered Stop   07/29/17 2359  piperacillin-tazobactam (ZOSYN) IVPB 3.375 g     3.375 g 12.5 mL/hr over 240 Minutes Intravenous Every 8 hours 07/29/17 1729     07/29/17 1730  piperacillin-tazobactam (ZOSYN) IVPB 3.375 g     3.375 g 100 mL/hr over 30 Minutes Intravenous  Once 07/29/17 1728 07/29/17 1853      Medications: Scheduled Meds: . sodium chloride flush  10-40 mL Intracatheter Q12H   Continuous Infusions: . dextrose 5 % and 0.9 % NaCl with KCl 20 mEq/L 125 mL/hr at 08/02/17 0502  . heparin 1,150 Units/hr (08/02/17 0753)  . piperacillin-tazobactam (ZOSYN)  IV Stopped (08/02/17 0902)   PRN Meds:.acetaminophen **OR** acetaminophen, fentaNYL (SUBLIMAZE) injection, ondansetron **OR** ondansetron (ZOFRAN) IV, phenol, sodium chloride flush  Assessment/Plan: Patient Active Problem List   Diagnosis Date Noted  . Severe malnutrition (H[facility]) 07/31/2017  . Colon obstruction (H[facility]) 07/31/2017  . Malnutrition of moderate degree 07/30/2017  . Bowel obstruction ([facility]) 07/29/2017  . Malignant neoplasm of splenic flexure ([facility])   . 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 Diarrhea 07/10/2017  . Abdominal pain 07/10/2017  . Sepsis (H[facility]) 07/10/2017  . Hyponatremia 07/10/2017  . Depression 07/10/2017  . Chronic anticoagulation 07/10/2017  . On antineoplastic chemotherapy 07/10/2017  . 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 Metastatic colon   adenocarcinoma to pancreas 07/10/2017  . Intra-abdominal abscess  07/10/2017  . Hypokalemia 07/10/2017  . Hypomagnesemia 07/10/2017  . 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 Port catheter in place 03/02/2017  . Goals of care, counseling/discussion 02/20/2017  . History of deep vein thrombosis (DVT) of lower extremity 02/18/2017  . Anemia, iron deficiency 01/27/2017  . Protein-calorie malnutrition, severe 01/13/2017  . Primary colon cancer with metastasis to other site ([place]) 01/12/2017   1.  Colonic obstruction at the splenic  flexure             Probably related to colon cancer.  Not much better since admission             Continue NGT, needs to ambulate             [name] has seen and discussed attempting a stent on Tuesday, 8/7.             Poor operative candidate - but if stent is not successful, will need surgery.  pt is not a candidate for resection - would involve L colectomy, splenectomy, dist pancreatectomy. Best surgical option would be diverting ostomy  2.  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             Question need to involve palliative care. 3.  Pancreatic abscess - perc drain - 07/11/2017 4.  History of DVT, right leg - was on Xarelto (last dose 7/31 per patient)             On IV heparin  5.  DVT prophylaxis - on IV Heparin 6.  Malnutrition             Prealbumin 6.4!             Consider TPN while waiting              Disposition: awaiting to see if able to do colonic stent  LOS: 4 days    Leighton Ruff. Redmond Pulling, MD, FACS General, Bariatric, & Minimally Invasive Surgery [phone] [facility]

## 2017-08-02 NOTE — Progress Notes (Signed)
Pharmacy: Re- heparin  Patient's a 56 y.o M with hx DVT and colonic obstruction with this admission currently on heparin drip while xarelto is on hold.  Confirmatory heparin level now back therapeutic at 0.48 (goal 0.3-0.7)  Plan: - continue heparin drip at 1150 units/hr - daily heparin level - monitor for s/s of bleeding - with plan for stent placement by GI on 08/03/17, please advise if/when heparin needs to be held for procedure  Dia Sitter, PharmD, BCPS 08/02/2017 8:04 PM

## 2017-08-02 NOTE — Progress Notes (Signed)
ANTICOAGULATION CONSULT NOTE -   Pharmacy Consult for Heparin Indication: DVT- bridge while off Xarelto  No Known Allergies  Patient Measurements: Height: 5\' 9"  (175.3 cm) Weight: 122 lb 9.2 oz (55.6 kg) IBW/kg (Calculated) : 70.7 Heparin Dosing Weight: 56.9 kg (act BW)  Vital Signs: Temp: 98.1 F (36.7 C) (08/06 0533) Temp Source: Oral (08/06 0533) BP: 142/107 (08/06 0533) Pulse Rate: 80 (08/06 0533)  Labs:  Recent Labs  07/31/17 0318 07/31/17 0515 08/01/17 0443 08/02/17 0616  HGB  --  9.6* 9.5*  --   HCT  --  28.8* 29.7*  --   PLT  --  114* 104*  --   HEPARINUNFRC 0.48  --  0.40 0.17*  CREATININE  --  0.66 0.89  --    Estimated Creatinine Clearance: 71.1 mL/min (by C-G formula based on SCr of 0.89 mg/dL).  Medical History: Past Medical History:  Diagnosis Date  . Acute deep vein thrombosis (DVT) of right lower extremity ([place]) 02/18/2017  . Malignant neoplasm of abdomen ([place])    [name] 01/11/2017  . Microcytic anemia    /notes 01/11/2017    Medications:  Infusions:  . dextrose 5 % and 0.9 % NaCl with KCl 20 mEq/L 125 mL/hr at 08/02/17 0502  . heparin 1,000 Units/hr (08/01/17 1845)  . piperacillin-tazobactam (ZOSYN)  IV 3.375 g (08/02/17 0502)   Assessment: 56 yo M on chronic Xarelto which is being held for SBO & possible need for diverting colostomy. Patient recalls last taking Xarelto on Monday, July 30th.   Baseline aPTT & Heparin level WNL.   CBC- Hg and Pltc WNL.  No bleeding noted.   Today, 08/02/2017  Heparin level subtherapeutic  CBC not drawn yet?!  plts low (8/5)  Per RN no issues reported overnight, still running at 1000 units/hr  Goal of Therapy:  Heparin level 0.3-0.7 units/ml aPTT 66-102 seconds Monitor platelets by anticoagulation protocol: Yes   Plan:   Bolus heparin drip 1500 units then  Increase heparin drip to 1150 units/hr  Heparin level in 6 hours  Daily CBC ordered, Daily Heparin level   Monitor for signs of  bleed  Thank you,   Dolly Rias RPh 08/02/2017, 7:24 AM Pager [phone]

## 2017-08-03 ENCOUNTER — Encounter (HOSPITAL_COMMUNITY)
Admission: EM | Disposition: A | Discharge status: Skilled Nursing Facility | Payer: Self-pay | Source: Home / Self Care | Attending: Internal Medicine

## 2017-08-03 ENCOUNTER — Inpatient Hospital Stay (HOSPITAL_COMMUNITY): Payer: Medicaid Other

## 2017-08-03 ENCOUNTER — Telehealth: Payer: Self-pay

## 2017-08-03 ENCOUNTER — Encounter (HOSPITAL_COMMUNITY): Payer: Self-pay | Admitting: *Deleted

## 2017-08-03 HISTORY — PX: FLEXIBLE SIGMOIDOSCOPY: SHX5431

## 2017-08-03 LAB — CBC
HEMATOCRIT: 28.1 % — AB (ref 39.0–52.0)
Hemoglobin: 9 g/dL — ABNORMAL LOW (ref 13.0–17.0)
MCH: 28 pg (ref 26.0–34.0)
MCHC: 32 g/dL (ref 30.0–36.0)
MCV: 87.5 fL (ref 78.0–100.0)
PLATELETS: 100 10*3/uL — AB (ref 150–400)
RBC: 3.21 MIL/uL — AB (ref 4.22–5.81)
RDW: 15 % (ref 11.5–15.5)
WBC: 6.2 10*3/uL (ref 4.0–10.5)

## 2017-08-03 LAB — BASIC METABOLIC PANEL
ANION GAP: 6 (ref 5–15)
BUN: <5 mg/dL — ABNORMAL LOW (ref 6–20)
CALCIUM: 8.9 mg/dL (ref 8.9–10.3)
CO2: 26 mmol/L (ref 22–32)
Chloride: 112 mmol/L — ABNORMAL HIGH (ref 101–111)
Creatinine, Ser: 0.78 mg/dL (ref 0.61–1.24)
Glucose, Bld: 118 mg/dL — ABNORMAL HIGH (ref 65–99)
Potassium: 3.8 mmol/L (ref 3.5–5.1)
Sodium: 144 mmol/L (ref 135–145)

## 2017-08-03 LAB — GLUCOSE, CAPILLARY: GLUCOSE-CAPILLARY: 85 mg/dL (ref 65–99)

## 2017-08-03 LAB — HEPARIN LEVEL (UNFRACTIONATED): Heparin Unfractionated: 0.53 IU/mL (ref 0.30–0.70)

## 2017-08-03 LAB — MAGNESIUM: Magnesium: 1.6 mg/dL — ABNORMAL LOW (ref 1.7–2.4)

## 2017-08-03 SURGERY — SIGMOIDOSCOPY, FLEXIBLE
Anesthesia: Moderate Sedation

## 2017-08-03 MED ORDER — FAT EMULSION 20 % IV EMUL
240.0000 mL | INTRAVENOUS | Status: AC
  Administered 2017-08-03: 240 mL via INTRAVENOUS
  Filled 2017-08-03: qty 250

## 2017-08-03 MED ORDER — MIDAZOLAM HCL 5 MG/ML IJ SOLN
INTRAMUSCULAR | Status: AC
  Filled 2017-08-03: qty 2

## 2017-08-03 MED ORDER — HYDRALAZINE HCL 20 MG/ML IJ SOLN
5.0000 mg | Freq: Four times a day (QID) | INTRAMUSCULAR | Status: DC | PRN
  Administered 2017-08-06: 5 mg via INTRAVENOUS
  Filled 2017-08-03: qty 0.25

## 2017-08-03 MED ORDER — HYDRALAZINE HCL 20 MG/ML IJ SOLN
10.0000 mg | Freq: Once | INTRAMUSCULAR | Status: AC
  Administered 2017-08-03: 10 mg via INTRAVENOUS
  Filled 2017-08-03: qty 0.5

## 2017-08-03 MED ORDER — MAGNESIUM SULFATE 2 GM/50ML IV SOLN
2.0000 g | Freq: Once | INTRAVENOUS | Status: AC
  Administered 2017-08-03: 2 g via INTRAVENOUS
  Filled 2017-08-03: qty 50

## 2017-08-03 MED ORDER — INSULIN ASPART 100 UNIT/ML ~~LOC~~ SOLN
0.0000 [IU] | Freq: Four times a day (QID) | SUBCUTANEOUS | Status: DC
  Administered 2017-08-04: 1 [IU] via SUBCUTANEOUS
  Administered 2017-08-04: 2 [IU] via SUBCUTANEOUS
  Administered 2017-08-04 – 2017-08-07 (×6): 1 [IU] via SUBCUTANEOUS

## 2017-08-03 MED ORDER — FENTANYL CITRATE (PF) 100 MCG/2ML IJ SOLN
INTRAMUSCULAR | Status: DC | PRN
  Administered 2017-08-03 (×3): 25 ug via INTRAVENOUS

## 2017-08-03 MED ORDER — TRACE MINERALS CR-CU-MN-SE-ZN 10-1000-500-60 MCG/ML IV SOLN
INTRAVENOUS | Status: AC
  Administered 2017-08-03: 18:00:00 via INTRAVENOUS
  Filled 2017-08-03: qty 960

## 2017-08-03 MED ORDER — SODIUM CHLORIDE 0.9 % IV SOLN: INTRAVENOUS | Status: DC

## 2017-08-03 MED ORDER — MIDAZOLAM HCL 10 MG/2ML IJ SOLN
INTRAMUSCULAR | Status: DC | PRN
  Administered 2017-08-03 (×3): 1 mg via INTRAVENOUS

## 2017-08-03 MED ORDER — KCL IN DEXTROSE-NACL 20-5-0.9 MEQ/L-%-% IV SOLN
INTRAVENOUS | Status: DC
  Administered 2017-08-03: 16:00:00 via INTRAVENOUS
  Filled 2017-08-03 (×2): qty 1000

## 2017-08-03 MED ORDER — FENTANYL CITRATE (PF) 100 MCG/2ML IJ SOLN
INTRAMUSCULAR | Status: AC
  Filled 2017-08-03: qty 2

## 2017-08-03 NOTE — Progress Notes (Signed)
[facility] NOTE   Pharmacy Consult for TPN Indication: intolerance to enteral feeds  Patient Measurements: Height: 5\' 9"  (175.3 cm) Weight: 130 lb 8.2 oz (59.2 kg) IBW/kg (Calculated) : 70.7 TPN AdjBW (KG): 55.3 Body mass index is 19.27 kg/m.   Insulin Requirements: none ordered, no history of diabetes  Current Nutrition: NPO  IVF: D5W NS with 46meq KCl/L at 157mls/hr  Central access: PICC TPN start date: 8/7  ASSESSMENT                                                                                                          HPI:  56 y.o.malewith medical hx significant for metastatic colon cancer on chemo, pancreatic abscess with percutaneous drain, iron deficiency anemia, and hx of right leg DVT on Xarelto.  presented to the EDt for evaluation of 3 days of abdominal pain with nausea, vomiting, and anorexia. CT of the abdomen and pelvis at the time of admission revealed a bowel obstruction at the level of the descending colon at the location of the percutaneous drainage catheter.  Significant events:   Today:    Glucose - <150  Electrolytes - Mag low> MD replaced, all others WNL  Renal - WNL  LFTs - WNL on 8/2 will order with am labs  TGs - order with am labs  Prealbumin - 6.4 (8/4)  NUTRITIONAL GOALS                                                                                             RD recs: from 8/3: calories 1880-2050 kcal/day, protein 85-97 grams/day Clinimix E 5/20 at a goal rate of 58ml/hr + 20% fat emulsion at 70ml/hr x 12 hours  to provide: 90 g/day protein, 2064 Kcal/day.  PLAN                                                                                                                         At 1800 today:  Start Clinimix E 5/20 @ 77mls/hr  20% fat emulsion at 64ml/hr x 12 hours  Plan to advance as tolerated to the goal rate.  TPN to contain standard multivitamins  and trace elements.  Reduce  IVF to 36ml/hr.  Add sensitive SSI .   TPN lab panels on Mondays & Thursdays.  F/u daily.  Dolly Rias RPh 08/03/2017, 11:17 AM Pager [phone]

## 2017-08-03 NOTE — Progress Notes (Signed)
Nutrition Follow-up  DOCUMENTATION CODES:   Severe malnutrition in context of chronic illness, Underweight  INTERVENTION:   Monitor magnesium, potassium, and phosphorus daily for at least 3 days, MD to replete as needed, as pt is at risk for refeeding syndrome given severe malnutrition and poor PO intake x 7 days.  TPN Per Pharmacy RD will continue to monitor   NUTRITION DIAGNOSIS:   Malnutrition (severe) related to chronic illness, catabolic illness, cancer and cancer related treatments as evidenced by severe depletion of muscle mass, severe depletion of body fat, percent weight loss.  Ongoing.  GOAL:   Patient will meet greater than or equal to 90% of their needs  Not meeting.  MONITOR:   Diet advancement, Labs, Weight trends, I & O's (TPN)  REASON FOR ASSESSMENT:   Consult New TPN/TNA  ASSESSMENT:   56 y.o. 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 Patient reports that he was in his usual state until approximately 3 days ago when he noted the fairly sudden onset of pain in the mid abdomen with nausea. He went on to develop nonbloody nonbilious vomiting, had a couple loose stools, and has had loss of appetite.  Patient has been NPO since admission 8/2 (5 days). Per initial assessment he did not keep any food down for a few days prior to 8/2. Patient has had poor PO intake for ~ 1 week now. Would monitor for refeeding syndrome.  Pt with NGT in place, output: 525 ml x 24 hrs.   Pt scheduled for stent placement this afternoon. Will monitor for plan following procedure.  Medications: D5 and .9% NaCl w/ KCl infusion at 125 ml/hr -provides 510 kcal, IV Mg sulfate Labs reviewed: Low Mg  Diet Order:  Diet  NPO time specified Except for: Ice Chips, Sips with Meds  Skin:  Reviewed, no issues  Last BM:  8/1  Height:   Ht Readings from Last 1 Encounters:  07/31/17 5\' 9"  (1.753 m)    Weight:   Wt Readings from Last 1 Encounters:  08/03/17 130 lb 8.2 oz (59.2 kg)    Ideal Body Weight:  72.73 kg  BMI:  Body mass index is 19.27 kg/m.  Estimated Nutritional Needs:   Kcal:  1880-2050 (33-36 kcal/kg)  Protein:  85-97 grams (1.5-1.7 grams/kg)  Fluid:  >/= 2 L/day  EDUCATION NEEDS:   No education needs identified at this time  Clayton Bibles, MS, RD, LDN Pager: [phone] After Hours Pager: [phone]

## 2017-08-03 NOTE — Congregational Nurse Program (Signed)
Congregational Nurse Program Note  Date of Encounter: 07/27/2017  Past Medical History: Past Medical History:  Diagnosis Date  . Acute deep vein thrombosis (DVT) of right lower extremity ([place]) 02/18/2017  . Malignant neoplasm of abdomen ([place])    [name] 01/11/2017  . Microcytic anemia    /notes 01/11/2017    Encounter Details:     CNP Questionnaire - 08/03/17 1809      Patient Demographics   Is this a new or existing patient? Existing   Patient is considered a/an Not Applicable   Race African-American/Black     Patient Assistance   Location of Patient Assistance [facility] patient   Patient's financial/insurance status Medicaid   Uninsured Patient ([place] [facility]/[employer]) No   Patient referred to apply for the following financial assistance Medicaid;Not Applicable   Food insecurities addressed Not Applicable   Transportation assistance No   Assistance securing medications No   Educational health offerings Cancer;Nutrition;Medications     Encounter Details   Primary purpose of visit Education/Health Concerns;Spiritual Care/Support Visit;Chronic Illness/Condition Visit;Post ED/Hospitalization Visit   Was an Emergency Department visit averted? Not Applicable   Does patient have a medical provider? Yes   Patient referred to Clinic;Follow up with established PCP   Was a mental health screening completed? (GAINS tool) No   Does patient have dental issues? No   Does patient have vision issues? No   Does your patient have an abnormal blood pressure today? No   Since previous encounter, have you referred patient for abnormal blood pressure that resulted in a new diagnosis or medication change? No   Does your patient have an abnormal blood glucose today? No   Since previous encounter, have you referred patient for abnormal blood glucose that resulted in a new diagnosis or medication change? No   Was there a life-saving intervention made? No    [facility] team visit to check on client .  States he has rally been busy and has some paper work to find. Seemed occupied and didn't have a lot to say today . States he feels good ,has kept all follow up appointments ,seen by his oncologist on yesterday ,drain doing well, no problems ,no pain . Chemo treatments on hold  For 1-2 months due to recent infection. Taking his medications ,no side effects ,no bleeding , eating and drinking fine he states . Encouraged to eat well and drink fluids .Daughter and client makes sure he has food and needed supplies . Met with housing lady and things are coming together ,gave her more paper work and hopes the apartment will be ready in 2-3 weeks . Will assist with furniture when we know more about housing needs.  Encouraged to call team if he needs anything that we can assist with , Sw working with daughter .  Monitor as needed ,will set visit up next week as client isnt sure which day he has to return to MD yet ,they are to call him.
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
## 2017-08-03 NOTE — Telephone Encounter (Signed)
TC to Sw regarding clients status .Will alert [facility] program of  Admission . Tc to hospital spoke with clients daughter ,surgery on tomorrow . Team will check on client. tomorrow .

## 2017-08-03 NOTE — H&P (View-Only) (Signed)
Patient ID: Jeff [name], male   [name]: 08/08/1961, 56 y.o.   MRN: [id number]   Acute Care Surgery Service Progress Note:    Chief Complaint/Subjective: No real c/o. No changes yesterday. Asks about drinking a soda  Objective: Vital signs in last 24 hours: Temp:  [98.1 F (36.7 C)-98.7 F (37.1 C)] 98.1 F (36.7 C) (08/06 0533) Pulse Rate:  [80-92] 82 (08/06 0839) Resp:  [14-18] 14 (08/06 0839) BP: (131-143)/(100-107) 131/101 (08/06 0839) SpO2:  [99 %-100 %] 99 % (08/06 0533) Weight:  [55.6 kg (122 lb 9.2 oz)] 55.6 kg (122 lb 9.2 oz) (08/06 0533) Last BM Date: 07/28/17  Intake/Output from previous day: 08/05 0701 - 08/06 0700 In: 3427.2 [I.V.:3127.2; IV Piggyback:300] Out: 1380 [Urine:300; Emesis/NG output:1075; Drains:5] Intake/Output this shift: Total I/O In: 0  Out: 102 [Emesis/NG output:100; Drains:2]  Lungs: cta, nonlabored; coughing up phlegm  Cardiovascular: reg  Abd: soft, distended, min TTP  Extremities: no edema, +SCDs  Neuro: alert, nonfocal  Gen:  Cachectic  Lab Results: CBC   Recent Labs  08/01/17 0443 08/02/17 0740  WBC 8.3 6.2  HGB 9.5* 9.5*  HCT 29.7* 29.4*  PLT 104* 99*   BMET  Recent Labs  08/01/17 0443 08/02/17 0740  NA 143 143  K 3.8 3.7  CL 109 110  CO2 26 27  GLUCOSE 128* 122*  BUN 6 <5*  CREATININE 0.89 0.76  CALCIUM 9.1 9.0   LFT Hepatic Function Latest Ref Rng & Units 07/30/2017 07/29/2017 07/26/2017  Total Protein 6.5 - 8.1 g/dL 7.2 9.0(H) 8.2  Albumin 3.5 - 5.0 g/dL 3.1(L) 3.7 3.3(L)  AST 15 - 41 U/L _0 ALT 17 - 63 U/L 7(L) 10(L) 6  Alk Phosphatase 38 - 126 U/L 46 62 87  Total Bilirubin 0.3 - 1.2 mg/dL 0.8 0.7 0.23  Bilirubin, Direct 0.0 - 0.3 mg/dL - - -   PT/INR No results for input(s): LABPROT, INR in the last 72 hours. ABG No results for input(s): PHART, HCO3 in the last 72 hours.  Invalid input(s): PCO2, PO2  Studies/Results:  Anti-infectives: Anti-infectives    Start     Dose/Rate Route  Frequency Ordered Stop   07/29/17 2359  piperacillin-tazobactam (ZOSYN) IVPB 3.375 g     3.375 g 12.5 mL/hr over 240 Minutes Intravenous Every 8 hours 07/29/17 1729     07/29/17 1730  piperacillin-tazobactam (ZOSYN) IVPB 3.375 g     3.375 g 100 mL/hr over 30 Minutes Intravenous  Once 07/29/17 1728 07/29/17 1853      Medications: Scheduled Meds: . sodium chloride flush  10-40 mL Intracatheter Q12H   Continuous Infusions: . dextrose 5 % and 0.9 % NaCl with KCl 20 mEq/L 125 mL/hr at 08/02/17 0502  . heparin 1,150 Units/hr (08/02/17 0753)  . piperacillin-tazobactam (ZOSYN)  IV Stopped (08/02/17 0902)   PRN Meds:.acetaminophen **OR** acetaminophen, fentaNYL (SUBLIMAZE) injection, ondansetron **OR** ondansetron (ZOFRAN) IV, phenol, sodium chloride flush  Assessment/Plan: Patient Active Problem List   Diagnosis Date Noted  . Severe malnutrition ([place]) 07/31/2017  . Colon obstruction ([place]) 07/31/2017  . Malnutrition of moderate degree 07/30/2017  . Bowel obstruction ([place]) 07/29/2017  . Malignant neoplasm of splenic flexure ([place])   . 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 Diarrhea 07/10/2017  . Abdominal pain 07/10/2017  . Sepsis ([place]) 07/10/2017  . Hyponatremia 07/10/2017  . Depression 07/10/2017  . Chronic anticoagulation 07/10/2017  . On antineoplastic chemotherapy 07/10/2017  . 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 Intra-abdominal abscess  07/10/2017  . Hypokalemia 07/10/2017  . Hypomagnesemia 07/10/2017  . 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 Port catheter in place 03/02/2017  . Goals of care, counseling/discussion 02/20/2017  . History of deep vein thrombosis (DVT) of lower extremity 02/18/2017  . Anemia, iron deficiency 01/27/2017  . Protein-calorie malnutrition, severe 01/13/2017  . Primary colon cancer with metastasis to other site ([place]) 01/12/2017   1.  Colonic obstruction at the splenic  flexure             Probably related to colon cancer.  Not much better since admission             Continue NGT, needs to ambulate             [name] has seen and discussed attempting a stent on Tuesday, 8/7.             Poor operative candidate - but if stent is not successful, will need surgery.  pt is not a candidate for resection - would involve L colectomy, splenectomy, dist pancreatectomy. Best surgical option would be diverting ostomy  2.  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             Question need to involve palliative care. 3.  Pancreatic abscess - perc drain - 07/11/2017 4.  History of DVT, right leg - was on Xarelto (last dose 7/31 per patient)             On IV heparin  5.  DVT prophylaxis - on IV Heparin 6.  Malnutrition             Prealbumin 6.4!             Consider TPN while waiting              Disposition: awaiting to see if able to do colonic stent  LOS: 4 days    Leighton Ruff. Redmond Pulling, MD, FACS General, Bariatric, & Minimally Invasive Surgery [phone] [facility]

## 2017-08-03 NOTE — Progress Notes (Signed)
ANTICOAGULATION CONSULT NOTE -   Pharmacy Consult for Heparin Indication: DVT- bridge while off Xarelto  No Known Allergies  Patient Measurements: Height: 5\' 9"  (175.3 cm) Weight: 130 lb 8.2 oz (59.2 kg) IBW/kg (Calculated) : 70.7 Heparin Dosing Weight: 56.9 kg (act BW)  Vital Signs: Temp: 98.2 F (36.8 C) (08/07 0648) Temp Source: Oral (08/07 0648) BP: 148/109 (08/07 0648) Pulse Rate: 83 (08/07 0648)  Labs:  Recent Labs  08/01/17 0443  08/02/17 0740 08/02/17 1353 08/02/17 1918 08/03/17 0405  HGB 9.5*  --  9.5*  --   --  9.0*  HCT 29.7*  --  29.4*  --   --  28.1*  PLT 104*  --  99*  --   --  100*  HEPARINUNFRC 0.40  < >  --  0.58 0.48 0.53  CREATININE 0.89  --  0.76  --   --  0.78  < > = values in this interval not displayed. Estimated Creatinine Clearance: 84.3 mL/min (by C-G formula based on SCr of 0.78 mg/dL).  Medical History: Past Medical History:  Diagnosis Date  . Acute deep vein thrombosis (DVT) of right lower extremity ([place]) 02/18/2017  . Malignant neoplasm of abdomen ([place])    [name] 01/11/2017  . Microcytic anemia    /notes 01/11/2017    Medications:  Infusions:  . dextrose 5 % and 0.9 % NaCl with KCl 20 mEq/L 125 mL/hr at 08/03/17 0550  . heparin 1,150 Units/hr (08/02/17 1552)  . piperacillin-tazobactam (ZOSYN)  IV 3.375 g (08/03/17 0550)   Assessment: 56 yo M on chronic Xarelto which is being held for SBO & possible need for diverting colostomy. Patient recalls last taking Xarelto on Monday, July 30th.   Baseline aPTT & Heparin level WNL.   CBC- Hg and Pltc WNL.  No bleeding noted.   Today, 08/03/2017  Heparin level  therapeutic   H/H low but stable  plts low   Per RN no issues   Goal of Therapy:  Heparin level 0.3-0.7 units/ml aPTT 66-102 seconds Monitor platelets by anticoagulation protocol: Yes   Plan:   continue heparin drip at 1150 units/hr  Daily CBC ordered, Daily Heparin level   Monitor for signs of bleed  Plan for stent  placement today by GI, orders for heparin to be held 3 hours before procedure  Thank you,   Dolly Rias RPh 08/03/2017, 7:54 AM Pager [phone]

## 2017-08-03 NOTE — Op Note (Signed)
[facility] Patient Name: Jeff [name] Procedure Date: 08/03/2017 MRN: [id number] Attending MD: [name] , MD Date of Birth: 11/29/1961 CSN: [id number] Age: 56 Admit Type: Outpatient Procedure:                Flexible Sigmoidoscopy Indications:              Abnormal CT of the GI tract Providers:                [name], MD, Cleda Daub, RN, Alan Mulder,                            Technician Referring MD:              Medicines:                Midazolam 3 mg IV, Fentanyl 75 micrograms IV Complications:            No immediate complications. Estimated Blood Loss:     Estimated blood loss was minimal. Procedure:                Pre-Anesthesia Assessment:                           - Prior to the procedure, a History and Physical                            was performed, and patient medications and                            allergies were reviewed. The patient's tolerance of                            previous anesthesia was also reviewed. The risks                            and benefits of the procedure and the sedation                            options and risks were discussed with the patient.                            All questions were answered, and informed consent                            was obtained. Prior Anticoagulants: The patient has                            taken heparin, last dose was 1 day prior to                            procedure. ASA Grade Assessment: IV - A patient                            with severe systemic disease that is a constant  threat to life. After reviewing the risks and                            benefits, the patient was deemed in satisfactory                            condition to undergo the procedure.                           - Sedation was administered by an endoscopy nurse.                            The sedation level attained was moderate.                           After obtaining  informed consent, the scope was                            passed under direct vision. The EC-[id number] ([id number])                            scope was introduced through the anus and advanced                            to the the descending colon. The flexible                            sigmoidoscopy was technically difficult and                            complex. The quality of the bowel preparation was                            good. Scope In: Scope Out: Findings:      A frond-like/villous, fungating and infiltrative completely obstructing       large mass was found in the descending colon. The mass was       circumferential. Oozing was present.      The mass completely occluded the lumen. I was not able to maneuver       through any potential luminal openings. Multiple attempts were made, but       it was not possible to move through the area. Impression:               - Malignant completely obstructing tumor in the                            descending colon.                           - No specimens collected. Moderate Sedation:      Moderate (conscious) sedation was administered by the endoscopy nurse       and supervised by the endoscopist. The following parameters were       monitored: oxygen saturation, heart rate, blood pressure, and response       to care.  Recommendation:           - Return patient to hospital ward for ongoing care.                           - NPO.                           - Surgical intervention for an ostomy. Procedure Code(s):        --- Professional ---                           [id number], Sigmoidoscopy, flexible; diagnostic,                            including collection of specimen(s) by brushing or                            washing, when performed (separate procedure) Diagnosis Code(s):        --- Professional ---                           C18.6, Malignant neoplasm of descending colon                           R93.3, Abnormal findings on diagnostic  imaging of                            other parts of digestive tract CPT copyright 2016 American Medical Association. All rights reserved. The codes documented in this report are preliminary and upon coder review may  be revised to meet current compliance requirements. [name], MD [name], MD 08/03/2017 3:30:58 PM This report has been signed electronically. Number of Addenda: 0

## 2017-08-03 NOTE — Congregational Nurse Program (Signed)
Congregational Nurse Program Note  Date of Encounter: 08/03/2017  Past Medical History: Past Medical History:  Diagnosis Date  . Acute deep vein thrombosis (DVT) of right lower extremity ([place]) 02/18/2017  . Malignant neoplasm of abdomen ([place])    [name] 01/11/2017  . Microcytic anemia    /notes 01/11/2017    Encounter Details:     CNP Questionnaire - 08/03/17 1805      Patient Demographics   Is this a new or existing patient? Existing   Patient is considered a/an Not Applicable   Race African-American/Black     Patient Assistance   Location of Patient Assistance [facility] patient   Patient's financial/insurance status Medicaid   Uninsured Patient ([facility]) No   Patient referred to apply for the following financial assistance Not Applicable   Food insecurities addressed Not Applicable   Transportation assistance No   Assistance securing medications No   Educational health offerings Cancer     Encounter Details   Primary purpose of visit Spiritual Care/Support Visit   Was an Emergency Department visit averted? Not Applicable   Does patient have a medical provider? Yes   Patient referred to Follow up with established PCP   Was a mental health screening completed? (GAINS tool) No   Does patient have dental issues? No   Does patient have vision issues? No   Does your patient have an abnormal blood pressure today? No   Since previous encounter, have you referred patient for abnormal blood pressure that resulted in a new diagnosis or medication change? No   Does your patient have an abnormal blood glucose today? No   Since previous encounter, have you referred patient for abnormal blood glucose that resulted in a new diagnosis or medication change? No   Was there a life-saving intervention made? No     TC to SW to set up appointment for visit this week . Sw to call client and arrange visit and get back with nurse . No viist to client today . Will visit this week

## 2017-08-03 NOTE — Interval H&P Note (Signed)
History and Physical Interval Note:  08/03/2017 1:40 PM  Jeff [name]  has presented today for surgery, with the diagnosis of Colonic obstruction.  The various methods of treatment have been discussed with the patient and family. After consideration of risks, benefits and other options for treatment, the patient has consented to  Procedure(s): FLEXIBLE SIGMOIDOSCOPY (N/A) as a surgical intervention .  The patient's history has been reviewed, patient examined, no change in status, stable for surgery.  I have reviewed the patient's chart and labs.  Questions were answered to the patient's satisfaction.     [name]

## 2017-08-04 ENCOUNTER — Telehealth: Payer: Self-pay

## 2017-08-04 ENCOUNTER — Inpatient Hospital Stay (HOSPITAL_COMMUNITY): Payer: Medicaid Other | Admitting: Anesthesiology

## 2017-08-04 ENCOUNTER — Encounter (HOSPITAL_COMMUNITY)
Admission: EM | Disposition: A | Discharge status: Skilled Nursing Facility | Payer: Self-pay | Source: Home / Self Care | Attending: Internal Medicine

## 2017-08-04 DIAGNOSIS — Z978 Presence of other specified devices: Secondary | ICD-10-CM

## 2017-08-04 HISTORY — PX: LAPAROTOMY: SHX154

## 2017-08-04 LAB — GLUCOSE, CAPILLARY
GLUCOSE-CAPILLARY: 90 mg/dL (ref 65–99)
Glucose-Capillary: 121 mg/dL — ABNORMAL HIGH (ref 65–99)
Glucose-Capillary: 128 mg/dL — ABNORMAL HIGH (ref 65–99)
Glucose-Capillary: 145 mg/dL — ABNORMAL HIGH (ref 65–99)
Glucose-Capillary: 152 mg/dL — ABNORMAL HIGH (ref 65–99)
Glucose-Capillary: 163 mg/dL — ABNORMAL HIGH (ref 65–99)
Glucose-Capillary: 202 mg/dL — ABNORMAL HIGH (ref 65–99)

## 2017-08-04 LAB — DIFFERENTIAL
Basophils Absolute: 0 10*3/uL (ref 0.0–0.1)
Basophils Relative: 0 %
EOS ABS: 0.3 10*3/uL (ref 0.0–0.7)
EOS PCT: 3 %
LYMPHS ABS: 1.8 10*3/uL (ref 0.7–4.0)
Lymphocytes Relative: 22 %
Monocytes Absolute: 0.6 10*3/uL (ref 0.1–1.0)
Monocytes Relative: 7 %
NEUTROS PCT: 67 %
Neutro Abs: 5.5 10*3/uL (ref 1.7–7.7)

## 2017-08-04 LAB — COMPREHENSIVE METABOLIC PANEL
ALK PHOS: 40 U/L (ref 38–126)
ALT: 6 U/L — ABNORMAL LOW (ref 17–63)
ANION GAP: 6 (ref 5–15)
AST: 15 U/L (ref 15–41)
Albumin: 3 g/dL — ABNORMAL LOW (ref 3.5–5.0)
BILIRUBIN TOTAL: 0.4 mg/dL (ref 0.3–1.2)
BUN: 5 mg/dL — ABNORMAL LOW (ref 6–20)
CALCIUM: 8.8 mg/dL — AB (ref 8.9–10.3)
CO2: 25 mmol/L (ref 22–32)
Chloride: 109 mmol/L (ref 101–111)
Creatinine, Ser: 0.81 mg/dL (ref 0.61–1.24)
Glucose, Bld: 127 mg/dL — ABNORMAL HIGH (ref 65–99)
Potassium: 3.6 mmol/L (ref 3.5–5.1)
SODIUM: 140 mmol/L (ref 135–145)
TOTAL PROTEIN: 7.4 g/dL (ref 6.5–8.1)

## 2017-08-04 LAB — CBC
HCT: 28.8 % — ABNORMAL LOW (ref 39.0–52.0)
Hemoglobin: 9.3 g/dL — ABNORMAL LOW (ref 13.0–17.0)
MCH: 27.4 pg (ref 26.0–34.0)
MCHC: 32.3 g/dL (ref 30.0–36.0)
MCV: 85 fL (ref 78.0–100.0)
PLATELETS: 104 10*3/uL — AB (ref 150–400)
RBC: 3.39 MIL/uL — ABNORMAL LOW (ref 4.22–5.81)
RDW: 15 % (ref 11.5–15.5)
WBC: 8.1 10*3/uL (ref 4.0–10.5)

## 2017-08-04 LAB — TYPE AND SCREEN
ABO/RH(D): O POS
ANTIBODY SCREEN: NEGATIVE

## 2017-08-04 LAB — TRIGLYCERIDES: Triglycerides: 445 mg/dL — ABNORMAL HIGH (ref <150)

## 2017-08-04 LAB — ABO/RH: ABO/RH(D): O POS

## 2017-08-04 LAB — PREALBUMIN: PREALBUMIN: 9.8 mg/dL — AB (ref 18–38)

## 2017-08-04 LAB — HEPARIN LEVEL (UNFRACTIONATED): Heparin Unfractionated: 0.4 IU/mL (ref 0.30–0.70)

## 2017-08-04 LAB — MAGNESIUM: MAGNESIUM: 1.7 mg/dL (ref 1.7–2.4)

## 2017-08-04 LAB — PHOSPHORUS: PHOSPHORUS: 4 mg/dL (ref 2.5–4.6)

## 2017-08-04 SURGERY — LAPAROTOMY, EXPLORATORY
Anesthesia: General

## 2017-08-04 MED ORDER — LIDOCAINE 2% (20 MG/ML) 5 ML SYRINGE
INTRAMUSCULAR | Status: DC | PRN
  Administered 2017-08-04: 60 mg via INTRAVENOUS

## 2017-08-04 MED ORDER — MIDAZOLAM HCL 2 MG/2ML IJ SOLN
INTRAMUSCULAR | Status: DC | PRN
  Administered 2017-08-04: 1 mg via INTRAVENOUS

## 2017-08-04 MED ORDER — ALBUTEROL SULFATE HFA 108 (90 BASE) MCG/ACT IN AERS
INHALATION_SPRAY | RESPIRATORY_TRACT | Status: AC
  Filled 2017-08-04: qty 6.7

## 2017-08-04 MED ORDER — HYDROMORPHONE HCL-NACL 0.5-0.9 MG/ML-% IV SOSY
0.2500 mg | PREFILLED_SYRINGE | INTRAVENOUS | Status: DC | PRN
  Administered 2017-08-04 (×4): 0.5 mg via INTRAVENOUS

## 2017-08-04 MED ORDER — PROPOFOL 10 MG/ML IV BOLUS
INTRAVENOUS | Status: DC | PRN
  Administered 2017-08-04: 20 mg via INTRAVENOUS
  Administered 2017-08-04: 150 mg via INTRAVENOUS

## 2017-08-04 MED ORDER — ROCURONIUM BROMIDE 10 MG/ML (PF) SYRINGE
PREFILLED_SYRINGE | INTRAVENOUS | Status: DC | PRN
  Administered 2017-08-04: 30 mg via INTRAVENOUS
  Administered 2017-08-04 (×2): 10 mg via INTRAVENOUS
  Administered 2017-08-04: 5 mg via INTRAVENOUS
  Administered 2017-08-04: 10 mg via INTRAVENOUS

## 2017-08-04 MED ORDER — MAGNESIUM SULFATE IN D5W 1-5 GM/100ML-% IV SOLN
1.0000 g | Freq: Once | INTRAVENOUS | Status: DC
  Filled 2017-08-04: qty 100

## 2017-08-04 MED ORDER — ALBUMIN HUMAN 5 % IV SOLN
INTRAVENOUS | Status: DC | PRN
  Administered 2017-08-04: 11:00:00 via INTRAVENOUS

## 2017-08-04 MED ORDER — ALBUMIN HUMAN 5 % IV SOLN
INTRAVENOUS | Status: AC
  Filled 2017-08-04: qty 250

## 2017-08-04 MED ORDER — TRACE MINERALS CR-CU-MN-SE-ZN 10-1000-500-60 MCG/ML IV SOLN
INTRAVENOUS | Status: AC
  Administered 2017-08-04: 18:00:00 via INTRAVENOUS
  Filled 2017-08-04: qty 1440

## 2017-08-04 MED ORDER — SUCCINYLCHOLINE CHLORIDE 200 MG/10ML IV SOSY
PREFILLED_SYRINGE | INTRAVENOUS | Status: DC | PRN
  Administered 2017-08-04: 100 mg via INTRAVENOUS

## 2017-08-04 MED ORDER — DEXTROSE 5 % IV SOLN: 2.0000 g | INTRAVENOUS | Status: DC

## 2017-08-04 MED ORDER — DEXAMETHASONE SODIUM PHOSPHATE 10 MG/ML IJ SOLN
INTRAMUSCULAR | Status: AC
  Filled 2017-08-04: qty 1

## 2017-08-04 MED ORDER — ONDANSETRON HCL 4 MG/2ML IJ SOLN
INTRAMUSCULAR | Status: AC
  Filled 2017-08-04: qty 2

## 2017-08-04 MED ORDER — PHENYLEPHRINE 40 MCG/ML (10ML) SYRINGE FOR IV PUSH (FOR BLOOD PRESSURE SUPPORT)
PREFILLED_SYRINGE | INTRAVENOUS | Status: DC | PRN
  Administered 2017-08-04 (×2): 80 ug via INTRAVENOUS

## 2017-08-04 MED ORDER — SUGAMMADEX SODIUM 200 MG/2ML IV SOLN
INTRAVENOUS | Status: DC | PRN
  Administered 2017-08-04: 150 mg via INTRAVENOUS

## 2017-08-04 MED ORDER — MIDAZOLAM HCL 2 MG/2ML IJ SOLN
INTRAMUSCULAR | Status: AC
  Filled 2017-08-04: qty 2

## 2017-08-04 MED ORDER — FENTANYL CITRATE (PF) 100 MCG/2ML IJ SOLN
25.0000 ug | INTRAMUSCULAR | Status: DC | PRN
  Administered 2017-08-04 – 2017-08-05 (×10): 50 ug via INTRAVENOUS
  Filled 2017-08-04 (×8): qty 2

## 2017-08-04 MED ORDER — MAGNESIUM SULFATE IN D5W 1-5 GM/100ML-% IV SOLN
1.0000 g | Freq: Once | INTRAVENOUS | Status: AC
  Administered 2017-08-04: 1 g via INTRAVENOUS
  Filled 2017-08-04: qty 100

## 2017-08-04 MED ORDER — ROCURONIUM BROMIDE 50 MG/5ML IV SOSY
PREFILLED_SYRINGE | INTRAVENOUS | Status: AC
  Filled 2017-08-04: qty 5

## 2017-08-04 MED ORDER — LABETALOL HCL 5 MG/ML IV SOLN
INTRAVENOUS | Status: AC
  Filled 2017-08-04: qty 4

## 2017-08-04 MED ORDER — DEXAMETHASONE SODIUM PHOSPHATE 10 MG/ML IJ SOLN
INTRAMUSCULAR | Status: DC | PRN
  Administered 2017-08-04: 10 mg via INTRAVENOUS

## 2017-08-04 MED ORDER — LABETALOL HCL 5 MG/ML IV SOLN
INTRAVENOUS | Status: DC | PRN
  Administered 2017-08-04 (×3): 5 mg via INTRAVENOUS

## 2017-08-04 MED ORDER — PROMETHAZINE HCL 25 MG/ML IJ SOLN: 6.2500 mg | INTRAMUSCULAR | Status: DC | PRN

## 2017-08-04 MED ORDER — FENTANYL CITRATE (PF) 250 MCG/5ML IJ SOLN
INTRAMUSCULAR | Status: AC
  Filled 2017-08-04: qty 5

## 2017-08-04 MED ORDER — SODIUM CHLORIDE 0.9 % IJ SOLN
INTRAMUSCULAR | Status: AC
  Filled 2017-08-04: qty 20

## 2017-08-04 MED ORDER — PROPOFOL 10 MG/ML IV BOLUS
INTRAVENOUS | Status: AC
  Filled 2017-08-04: qty 20

## 2017-08-04 MED ORDER — SUCCINYLCHOLINE CHLORIDE 200 MG/10ML IV SOSY
PREFILLED_SYRINGE | INTRAVENOUS | Status: AC
  Filled 2017-08-04: qty 10

## 2017-08-04 MED ORDER — POTASSIUM CHLORIDE 10 MEQ/100ML IV SOLN
10.0000 meq | INTRAVENOUS | Status: AC
  Administered 2017-08-04 (×2): 10 meq via INTRAVENOUS
  Filled 2017-08-04 (×2): qty 100

## 2017-08-04 MED ORDER — ONDANSETRON HCL 4 MG/2ML IJ SOLN
INTRAMUSCULAR | Status: DC | PRN
  Administered 2017-08-04: 4 mg via INTRAVENOUS

## 2017-08-04 MED ORDER — HYDROMORPHONE HCL 1 MG/ML IJ SOLN
INTRAMUSCULAR | Status: DC | PRN
  Administered 2017-08-04 (×4): .5 mg via INTRAVENOUS

## 2017-08-04 MED ORDER — HYDROMORPHONE HCL 2 MG/ML IJ SOLN
INTRAMUSCULAR | Status: AC
  Filled 2017-08-04: qty 1

## 2017-08-04 MED ORDER — 0.9 % SODIUM CHLORIDE (POUR BTL) OPTIME
TOPICAL | Status: DC | PRN
  Administered 2017-08-04: 2000 mL

## 2017-08-04 MED ORDER — POTASSIUM CHLORIDE 10 MEQ/100ML IV SOLN
10.0000 meq | INTRAVENOUS | Status: AC
  Filled 2017-08-04 (×2): qty 100

## 2017-08-04 MED ORDER — HEPARIN (PORCINE) IN NACL 100-0.45 UNIT/ML-% IJ SOLN
1150.0000 [IU]/h | INTRAMUSCULAR | Status: DC
  Administered 2017-08-04: 1150 [IU]/h via INTRAVENOUS
  Filled 2017-08-04: qty 250

## 2017-08-04 MED ORDER — POTASSIUM CHLORIDE 10 MEQ/100ML IV SOLN
10.0000 meq | INTRAVENOUS | Status: DC
  Filled 2017-08-04 (×2): qty 100

## 2017-08-04 MED ORDER — ALBUTEROL SULFATE HFA 108 (90 BASE) MCG/ACT IN AERS
INHALATION_SPRAY | RESPIRATORY_TRACT | Status: DC | PRN
  Administered 2017-08-04: 7 via RESPIRATORY_TRACT

## 2017-08-04 MED ORDER — LACTATED RINGERS IV SOLN
INTRAVENOUS | Status: DC | PRN
  Administered 2017-08-04: 11:00:00 via INTRAVENOUS

## 2017-08-04 MED ORDER — CEFOTETAN DISODIUM-DEXTROSE 2-2.08 GM-% IV SOLR
INTRAVENOUS | Status: AC
  Filled 2017-08-04: qty 50

## 2017-08-04 MED ORDER — HYDRALAZINE HCL 20 MG/ML IJ SOLN
INTRAMUSCULAR | Status: DC | PRN
  Administered 2017-08-04 (×2): 5 mg via INTRAVENOUS

## 2017-08-04 MED ORDER — KCL IN DEXTROSE-NACL 20-5-0.9 MEQ/L-%-% IV SOLN
INTRAVENOUS | Status: AC
  Administered 2017-08-04: 65 mL/h via INTRAVENOUS
  Filled 2017-08-04 (×2): qty 1000

## 2017-08-04 MED ORDER — KCL IN DEXTROSE-NACL 20-5-0.9 MEQ/L-%-% IV SOLN
INTRAVENOUS | Status: AC
  Filled 2017-08-04: qty 1000

## 2017-08-04 MED ORDER — FAT EMULSION 20 % IV EMUL
240.0000 mL | INTRAVENOUS | Status: DC
  Filled 2017-08-04: qty 250

## 2017-08-04 MED ORDER — LACTATED RINGERS IV SOLN
INTRAVENOUS | Status: DC | PRN
  Administered 2017-08-04 (×3): via INTRAVENOUS

## 2017-08-04 MED ORDER — FENTANYL CITRATE (PF) 100 MCG/2ML IJ SOLN
INTRAMUSCULAR | Status: DC | PRN
  Administered 2017-08-04 (×2): 100 ug via INTRAVENOUS
  Administered 2017-08-04: 50 ug via INTRAVENOUS

## 2017-08-04 MED ORDER — HYDROMORPHONE HCL-NACL 0.5-0.9 MG/ML-% IV SOSY
PREFILLED_SYRINGE | INTRAVENOUS | Status: AC
  Administered 2017-08-04: 0.5 mg via INTRAVENOUS
  Filled 2017-08-04: qty 4

## 2017-08-04 MED ORDER — LIDOCAINE 2% (20 MG/ML) 5 ML SYRINGE
INTRAMUSCULAR | Status: AC
  Filled 2017-08-04: qty 5

## 2017-08-04 MED ORDER — CEFOTETAN DISODIUM-DEXTROSE 2-2.08 GM-% IV SOLR
2.0000 g | Freq: Once | INTRAVENOUS | Status: AC
  Administered 2017-08-04: 2 g via INTRAVENOUS

## 2017-08-04 MED ORDER — PIPERACILLIN-TAZOBACTAM 3.375 G IVPB
3.3750 g | Freq: Three times a day (TID) | INTRAVENOUS | Status: AC
  Administered 2017-08-04 – 2017-08-05 (×3): 3.375 g via INTRAVENOUS
  Filled 2017-08-04 (×3): qty 50

## 2017-08-04 SURGICAL SUPPLY — 45 items
BLADE EXTENDED COATED 6.5IN (ELECTRODE) ×3 IMPLANT
CELLS DAT CNTRL 66122 CELL SVR (MISCELLANEOUS) IMPLANT
CLIP VESOCCLUDE LG 6/CT (CLIP) IMPLANT
COVER SURGICAL LIGHT HANDLE (MISCELLANEOUS) ×3 IMPLANT
DRAIN CHANNEL 19F RND (DRAIN) IMPLANT
DRAPE LAPAROSCOPIC ABDOMINAL (DRAPES) ×2 IMPLANT
DRSG OPSITE POSTOP 4X6 (GAUZE/BANDAGES/DRESSINGS) IMPLANT
DRSG OPSITE POSTOP 4X8 (GAUZE/BANDAGES/DRESSINGS) IMPLANT
ELECT CAUTERY BLADE TIP 2.5 (TIP) ×3
ELECT REM PT RETURN 15FT ADLT (MISCELLANEOUS) ×3 IMPLANT
ELECTRODE CAUTERY BLDE TIP 2.5 (TIP) IMPLANT
EVACUATOR SILICONE 100CC (DRAIN) ×3 IMPLANT
GAUZE SPONGE 4X4 12PLY STRL (GAUZE/BANDAGES/DRESSINGS) ×3 IMPLANT
GLOVE BIO SURGEON STRL SZ7.5 (GLOVE) ×6 IMPLANT
GLOVE BIOGEL PI IND STRL 7.0 (GLOVE) ×1 IMPLANT
GLOVE BIOGEL PI INDICATOR 7.0 (GLOVE) ×2
GLOVE INDICATOR 8.0 STRL GRN (GLOVE) ×6 IMPLANT
GOWN STRL REUS W/TWL XL LVL3 (GOWN DISPOSABLE) ×10 IMPLANT
LEGGING LITHOTOMY PAIR STRL (DRAPES) IMPLANT
NS IRRIG 1000ML POUR BTL (IV SOLUTION) ×6 IMPLANT
PACK GENERAL/GYN (CUSTOM PROCEDURE TRAY) ×3 IMPLANT
RETRACTOR WND ALEXIS 18 MED (MISCELLANEOUS) IMPLANT
RTRCTR WOUND ALEXIS 18CM MED (MISCELLANEOUS)
SEALER TISSUE X1 CVD JAW (INSTRUMENTS) IMPLANT
SHEARS FOC LG CVD HARMONIC 17C (MISCELLANEOUS) IMPLANT
SHEARS HARMONIC ACE PLUS 36CM (ENDOMECHANICALS) IMPLANT
SPONGE LAP 18X18 X RAY DECT (DISPOSABLE) ×2 IMPLANT
STAPLER VISISTAT 35W (STAPLE) ×3 IMPLANT
SUT ETHILON 2 0 PS N (SUTURE) ×4 IMPLANT
SUT PDS AB 1 CTX 36 (SUTURE) ×4 IMPLANT
SUT PDS AB 1 TP1 96 (SUTURE) IMPLANT
SUT PDS AB 3-0 SH 27 (SUTURE) ×3 IMPLANT
SUT PDS AB 4-0 SH 27 (SUTURE) IMPLANT
SUT PROLENE 2 0 BLUE (SUTURE) IMPLANT
SUT SILK 2 0 (SUTURE) ×3
SUT SILK 2 0 SH CR/8 (SUTURE) ×2 IMPLANT
SUT SILK 2 0SH CR/8 30 (SUTURE) IMPLANT
SUT SILK 2-0 18XBRD TIE 12 (SUTURE) ×2 IMPLANT
SUT SILK 2-0 30XBRD TIE 12 (SUTURE) IMPLANT
SUT SILK 3 0 (SUTURE)
SUT SILK 3 0 SH CR/8 (SUTURE) ×8 IMPLANT
SUT SILK 3-0 18XBRD TIE 12 (SUTURE) ×2 IMPLANT
TOWEL OR 17X26 10 PK STRL BLUE (TOWEL DISPOSABLE) ×3 IMPLANT
TOWEL OR NON WOVEN STRL DISP B (DISPOSABLE) ×3 IMPLANT
TRAY FOLEY W/METER SILVER 16FR (SET/KITS/TRAYS/PACK) ×3 IMPLANT

## 2017-08-04 NOTE — Brief Op Note (Signed)
07/29/2017 - 08/04/2017  1:54 PM  PATIENT:  Jeff [name]  56 y.o. male  PRE-OPERATIVE DIAGNOSIS:  Obstructing metastatic left colon cancer POST-OPERATIVE DIAGNOSIS:  same  PROCEDURE:  Procedure(s): LOOP  COLOSTOMY (N/A)  SURGEON:  Surgeon(s) and Role:    [name], MD - Primary  PHYSICIAN ASSISTANT:   ASSISTANTS: none   ANESTHESIA:   general  EBL:  Total I/O In: 2770 [I.V.:2520; IV Piggyback:250] Out: 575 [Urine:275; Emesis/NG output:200; Blood:100]  BLOOD ADMINISTERED:none  DRAINS: Urinary Catheter (Foley) NG tube  LOCAL MEDICATIONS USED:  NONE  SPECIMEN:  No Specimen  DISPOSITION OF SPECIMEN:  N/A  COUNTS:  YES  TOURNIQUET:  * No tourniquets in log *  DICTATION: .Other Dictation: Dictation Number [id number]  PLAN OF CARE: pacu then back to room  PATIENT DISPOSITION:  PACU - hemodynamically stable.   Delay start of Pharmacological VTE agent (>24hrs) due to surgical blood loss or risk of bleeding: no - can start hep gtt at [facility] Wednesday 8/8  Leighton Ruff. Redmond Pulling, MD, FACS General, Bariatric, & Minimally Invasive Surgery [facility], [place]

## 2017-08-04 NOTE — Progress Notes (Signed)
[place] NOTE   Pharmacy Consult for TPN Indication: intolerance to enteral feeds  Patient Measurements: Height: 5\' 9"  (175.3 cm) Weight: 130 lb 8.2 oz (59.2 kg) IBW/kg (Calculated) : 70.7 TPN AdjBW (KG): 55.3 Body mass index is 19.27 kg/m.   Insulin Requirements: 2 units since TPN started from SSI  Current Nutrition: NPO  IVF: D5W NS with 55meq KCl/L at 14 ml/hr  Central access: PICC TPN start date: 8/7  ASSESSMENT                                                                                                          HPI:  56 y.o.malewith medical hx significant for metastatic colon cancer on chemo, pancreatic abscess with percutaneous drain, iron deficiency anemia, and hx of right leg DVT on Xarelto.  Presented to the ED for evaluation of 3 days of abdominal pain with nausea, vomiting, and anorexia. CT of the abdomen and pelvis at the time of admission revealed a bowel obstruction at the level of the descending colon at the location of the percutaneous drainage catheter.  Significant events:  8/7: Sigmoidoscopy- Malignant completely obstructing tumor in the                                    descending colon. 8/8: for colostomy   Today:   Glucose (goal <150); no hx DM; cbgs at goal  Electrolytes: phos 4;   Mag 1.7, K 3.6  Renal: scr ok  LFTs: ok  TGs: 445 (8/8)  Prealbumin - 6.4 (8/4), 9.8 (8/8)  NUTRITIONAL GOALS                                                                                             RD recs (8/7): Kcal:  1880-2050 (33-36 kcal/kg) Protein:  85-97 grams (1.5-1.7 grams/kg)  Clinimix E 5/20 at a goal rate of 66ml/hr + 20% fat emulsion at 70ml/hr x 12 hours  to provide: 90 g/day protein, 2064 Kcal/day.  PLAN  Now: - magnesium sulfate 1 gm IV x1 - potassium chloride 10 meq x2  runs  At 1800 today:  Increase Clinimix E 5/20 rate to 72mls/hr  With Triglyceride >400, will hold off on giving fat emulsion for now  Plan to advance as tolerated to the goal rate.  TPN to contain standard multivitamins and trace elements.  Reduce IVF to 63ml/hr.  continue sensitive SSI q6h  TPN lab panels on Mondays & Thursdays.  With risk for refeeding syndrome, will order daily CMET, magnesium and phosphorus daily through 08/06/17  Dia Sitter, PharmD, BCPS 08/04/2017 7:19 AM

## 2017-08-04 NOTE — Consult Note (Signed)
[place] Nurse ostomy consult note  [place] Nurse requested for preoperative stoma site marking by Dr. Redmond Pulling.  Discussed surgical procedure and stoma creation with patient.  Explained role of the [place] nurse team.    Answered patient questions.   Examined patient sitting and standing in order to place the marking in the patient's visual field, away from any creases or abdominal contour issues and within the rectus muscle. The abdomen is very distended and firm.  Due to tube, patient wears briefs and waistband of slacks at the pubic line.   Marked for colostomy in the LUQ  5.5 cm to the left of the umbilicus and 6.0 cm above the umbilicus.  Marked for ileostomy in the RUQ  6cm to the right of the umbilicus and  6.0 cm above the umbilicus.   Patient's abdomen cleansed with CHG wipes at site markings, allowed to air dry prior to marking.Covered mark with thin film transparent dressing to preserve mark until surgical case begins.  Dr. Redmond Pulling in to see patient as I was completing marking; he indicates that it may not be possible to reach marks due to condition of patients bowel and location of obstruction.    [place] Nurse team will follow up with patient after surgery for continue ostomy care and teaching.   Thanks, Maudie Flakes, MSN, RN, [place], Arther Abbott  Pager# [phone]

## 2017-08-04 NOTE — Progress Notes (Signed)
ANTICOAGULATION CONSULT NOTE -   Pharmacy Consult for Heparin Indication: hx DVT- bridge while off Xarelto  No Known Allergies  Patient Measurements: Height: 5\' 9"  (175.3 cm) Weight: 130 lb 8.2 oz (59.2 kg) IBW/kg (Calculated) : 70.7 Heparin Dosing Weight: 56.9 kg (act BW)  Vital Signs: Temp: 97.9 F (36.6 C) (08/08 1445) Temp Source: Oral (08/08 0542) BP: 143/99 (08/08 1445) Pulse Rate: 115 (08/08 1445)  Labs:  Recent Labs  08/02/17 0740  08/02/17 1918 08/03/17 0405 08/04/17 0741  HGB 9.5*  --   --  9.0* 9.3*  HCT 29.4*  --   --  28.1* 28.8*  PLT 99*  --   --  100* 104*  HEPARINUNFRC  --   < > 0.48 0.53 0.40  CREATININE 0.76  --   --  0.78 0.81  < > = values in this interval not displayed. Estimated Creatinine Clearance: 83.2 mL/min (by C-G formula based on SCr of 0.81 mg/dL).  Medical History: Past Medical History:  Diagnosis Date  . Acute deep vein thrombosis (DVT) of right lower extremity ([place]) 02/18/2017  . Malignant neoplasm of abdomen ([place])    [name] 01/11/2017  . Microcytic anemia    /notes 01/11/2017    Medications:  Infusions:  . Marland KitchenTPN (CLINIMIX-E) Adult    . dextrose 5 % and 0.9 % NaCl with KCl 20 mEq/L    . dextrose 5 % and 0.9 % NaCl with KCl 20 mEq/L 65 mL/hr (08/04/17 1725)  . magnesium sulfate 1 - 4 g bolus IVPB    . piperacillin-tazobactam (ZOSYN)  IV    . Marland KitchenTPN (CLINIMIX-E) Adult Stopped (08/04/17 1340)   Assessment: Patient's a 56 y.o M with hx DVT and colonic obstruction with this admission currently on heparin drip while xarelto is on hold.  Significant events:  8/7: Sigmoidoscopy- Malignant completely obstructing tumor in the descending colon. 8/8: diverting colostomy -- heparin drip turned off at 0730 for procedure  Today, 08/04/2017 - heparin drip turned off this AM for OR - heparin level was 0.40 (drawn 10 minutes after heparin drip was d/ced). Heparin infusion had been running at 1150 units/hr.  Have been stable and therapeutic on  this rate. - cbc relatively stable - Consult received to resume heparin at 8pm tonight. AET 1341 today so will be resuming heparin infusion ~6 hours from surgery.  Goal of Therapy:  Heparin level 0.3-0.7 units/ml aPTT 66-102 seconds Monitor platelets by anticoagulation protocol: Yes   Plan:  Resume heparin infusion tonight at 8pm at previously therapeutic rate of 1150 units/hr. No bolus due to the recent procedure. Check heparin level 6 hours after resuming. Daily HL and CBC while on heparin infusion.  Hershal Coria, PharmD, BCPS Pager: [phone] 08/04/2017 5:38 PM

## 2017-08-04 NOTE — Telephone Encounter (Signed)
CN called clients room to check on him past surgery . Client was given the phone back in his room states doing okay brief encounter ,will check on him later. TC to SW to alert of of clients status. [facility] team will visit to check on client.

## 2017-08-04 NOTE — Anesthesia Procedure Notes (Signed)
Procedure Name: Intubation Date/Time: 08/04/2017 11:01 AM Performed by: Danley Danker L Patient Re-evaluated:Patient Re-evaluated prior to induction Oxygen Delivery Method: Circle system utilized Preoxygenation: Pre-oxygenation with 100% oxygen Induction Type: IV induction, Rapid sequence and Cricoid Pressure applied Laryngoscope Size: Miller and 3 Grade View: Grade I Tube type: Oral Tube size: 7.5 mm Number of attempts: 1 Airway Equipment and Method: Stylet Placement Confirmation: ETT inserted through vocal cords under direct vision,  positive ETCO2 and breath sounds checked- equal and bilateral Secured at: 20 cm Tube secured with: Tape Dental Injury: Teeth and Oropharynx as per pre-operative assessment

## 2017-08-04 NOTE — Op Note (Signed)
NAMEMERCY, MALENA NO.:  [id number]  MEDICAL RECORD NO.:  [id number]  LOCATION:  67                         FACILITY:  [facility]  PHYSICIAN:  Leighton Ruff. Redmond Pulling, MD, FACSDATE OF BIRTH:  06-17-1961  DATE OF PROCEDURE:  08/04/2017 DATE OF DISCHARGE:                              OPERATIVE REPORT   PREOPERATIVE DIAGNOSIS:  Obstructing metastatic left colon cancer.  POSTOPERATIVE DIAGNOSIS:  Obstructing metastatic left colon cancer.  PROCEDURE:  Transverse loop colostomy.  SURGEON:  Leighton Ruff. Redmond Pulling, MD, FACS  ANESTHESIA:  General.  ESTIMATED BLOOD LOSS:  Less than 50 mL.  SPECIMEN:  None.  INDICATIONS FOR PROCEDURE:  The patient is an unfortunate, 56 year old gentleman who has metastatic colonic cancer.  It is primarily focused in the splenic flexure, but it invades the splenic hilum and pancreatic tail.  Most recently, had a pancreatic abscess that required percutaneous drain.  His splenic flexure mass has progressed to complete obstruction.  Gastroenterology tried to place a colonic stent for palliation, however, they were unable to traverse the mass with the scope and were not able to place a stent.  At that point, I discussed palliative diverting loop colostomy with the patient.  We discussed at length the risks and benefits including, but not limited to, bleeding, infection, injury to surrounding structures, ostomy ischemia, ostomy prolapse, ostomy retraction, parastomal hernia, incisional hernia, intraabdominal abscess, blood clot formation, perioperative cardiac and pulmonary events.  The patient elected to proceed with surgery.  DESCRIPTION OF PROCEDURE:  The patient was marked by the Wound Ostomy nurse preoperatively both in the left and right upper quadrants.  He was on therapeutic antibiotics.  His heparin had been held at 7:15 this morning.  He had been on a heparin drip because of a history of DVT.  He was taken to the OR 4 at [facility],  placed supine on the operating room table.  General endotracheal anesthesia was established. His abdomen was prepped and draped in the usual standard surgical fashion with ChloraPrep.  His percutaneous drain was draped outside of the field.  The patient had gross abdominal distention due to his colonic obstruction.  His transverse colon on imaging was very high in his upper abdomen, it was being pushed cephalad due to his very dilated small bowel.  A surgical time-out was performed.  A Foley catheter had already been placed and sequential compression devices had been placed already as well.  Because I was concerned that his transverse colon was very high up in his abdomen, instead of making a transverse incision in the upper abdomen, made upper mini-midline incision, divided the skin with a 10 blade, subcutaneous tissue which was very little with electrocautery, and carefully entered the abdominal cavity.  There was gross ascites.  The small bowel was quite distended.  I was able to visualize the transverse colon.  There was some mobility in it.  I did leave and take down the omentum off the transverse colon with electrocautery along with Kelly's and 2-0 silk ties.  I felt that I could bring up a loop in the right upper quadrant.  I was not able to bring it down to the previously made colostomy  marking site.  In the bowel, there was just too much small bowel in the way.  A circular skin defect was made with a 10 blade.  Subcutaneous tissue was divided with electrocautery.  Anterior fascia was divided with electrocautery. Rectus muscles were spread and the posterior fascia was incised with electrocautery.  I was able to carefully enter the abdominal cavity. The stoma orifice was enlarged to accommodate 2 fingers.  I identified a section of the transverse colon that bowel could reach the right upper quadrant defect that I made.  A small rent was made in the mesentery just next to the wall  of the colon and a red rubber catheter was placed through it and encircled upon itself.  The colon was grossly dilated due to the distal obstruction.  Given how grossly distended it was, I felt I needed to make a decompressive colotomy in order to help facilitate mobilization of the colon up through the ostomy defect in the abdominal wall.  A pursestring suture was placed around my proposed colotomy site and this is right where the red rubber catheter had been passed.  The bowel was entered with electrocautery.  There, we evacuated about 500 mL of liquid stool.  This decompressed the colon some.  I was then able to bring up a loop colostomy through the previously placed ostomy defect in the right upper quadrant.  There was a little bit of tension on it.  At this point, I irrigated the abdomen with saline.  I could not achieve any more mobilization on the transverse colon, but it was not really retracting.  The midline was closed with a #1 non-looped PDS, 1 from above, 1 from below.  The skin was left open due to some spillage of stool that occurred from the colon when I made my decompressive colotomy.  I then matured the colostomy.  The pursestring suture that I placed was removed and I made a long larger defect in the colon.  I then matured it with interrupted 3-0 Vicryl sutures to the skin surface. Both the afferent and efferent limbs of the loop were patent and able to pass a finger each way.  I secured the red rubber catheter to the skin with 2-0 nylon sutures followed by application of an ostomy device. Moist gauze and sterile dressing were placed over the mini-midline incision.  All needle, instrument counts were correct x2.  There were no immediate complications.  The patient tolerated the procedure well.  He was taken to the recovery room in stable condition.     Leighton Ruff. Redmond Pulling, MD, FACS     EMW/MEDQ  D:  08/04/2017  T:  08/04/2017  Job:  [id number]

## 2017-08-04 NOTE — Anesthesia Postprocedure Evaluation (Signed)
Anesthesia Post Note  Patient: Jeff [name]  Procedure(s) Performed: Procedure(s) (LRB): LOOP  COLOSTOMY (N/A)     Patient location during evaluation: PACU Anesthesia Type: General Level of consciousness: sedated Pain management: pain level controlled Vital Signs Assessment: post-procedure vital signs reviewed and stable Respiratory status: spontaneous breathing and respiratory function stable Cardiovascular status: stable Anesthetic complications: no    Last Vitals:  Vitals:   08/04/17 1430 08/04/17 1445  BP: (!) 142/99 (!) 143/99  Pulse: (!) 118 (!) 115  Resp: 15 14  Temp:  36.6 C    Last Pain:  Vitals:   08/04/17 1445  TempSrc:   PainSc: Asleep                 Consetta Cosner DANIEL

## 2017-08-04 NOTE — Anesthesia Preprocedure Evaluation (Addendum)
Anesthesia Evaluation  Patient identified by MRN, date of birth, ID band Patient awake  General Assessment Comment:metastatic colon cancer on chemotherapy, pancreatic abscess with percutaneous drain, iron deficiency anemia, and history of right leg DVT on Xarelto,  Reviewed: Allergy & Precautions, NPO status , Patient's Chart, lab work & pertinent test results  Airway Mallampati: III  TM Distance: >3 FB Neck ROM: Full    Dental  (+) Poor Dentition, Chipped, Missing, Dental Advisory Given   Pulmonary neg pulmonary ROS,    Pulmonary exam normal breath sounds clear to auscultation       Cardiovascular + DVT  Normal cardiovascular exam Rhythm:Regular Rate:Normal     Neuro/Psych negative neurological ROS  negative psych ROS   GI/Hepatic negative GI ROS, Neg liver ROS,   Endo/Other  negative endocrine ROS  Renal/GU negative Renal ROS  negative genitourinary   Musculoskeletal negative musculoskeletal ROS (+)   Abdominal   Peds negative pediatric ROS (+)  Hematology  (+) anemia ,   Anesthesia Other Findings   Reproductive/Obstetrics negative OB ROS                            Anesthesia Physical Anesthesia Plan  ASA: III  Anesthesia Plan: General   Post-op Pain Management:    Induction: Intravenous, Rapid sequence and Cricoid pressure planned  PONV Risk Score and Plan: 2 and Ondansetron and Dexamethasone  Airway Management Planned: Oral ETT  Additional Equipment:   Intra-op Plan:   Post-operative Plan: Possible Post-op intubation/ventilation  Informed Consent: I have reviewed the patients History and Physical, chart, labs and discussed the procedure including the risks, benefits and alternatives for the proposed anesthesia with the patient or authorized representative who has indicated his/her understanding and acceptance.   Dental advisory given  Plan Discussed with: CRNA and  Surgeon  Anesthesia Plan Comments:        Anesthesia Quick Evaluation

## 2017-08-04 NOTE — Transfer of Care (Signed)
Immediate Anesthesia Transfer of Care Note  Patient: Jeff [name]  Procedure(s) Performed: Procedure(s): LOOP  COLOSTOMY (N/A)  Patient Location: PACU  Anesthesia Type:General  Level of Consciousness: awake  Airway & Oxygen Therapy: Patient Spontanous Breathing and Patient connected to face mask oxygen  Post-op Assessment: Report given to RN and Post -op Vital signs reviewed and stable  Post vital signs: Reviewed and stable  Last Vitals:  Vitals:   08/03/17 2137 08/04/17 0542  BP: (!) 145/98 (!) 147/99  Pulse: 93 96  Resp: (!) 22 (!) 22  Temp: 37.4 C 37 C    Last Pain:  Vitals:   08/04/17 0735  TempSrc:   PainSc: 5       Patients Stated Pain Goal: 2 (01/58/68 2574)  Complications: No apparent anesthesia complications

## 2017-08-04 NOTE — Progress Notes (Signed)
ANTICOAGULATION CONSULT NOTE -   Pharmacy Consult for Heparin Indication: hx DVT- bridge while off Xarelto  No Known Allergies  Patient Measurements: Height: 5\' 9"  (175.3 cm) Weight: 130 lb 8.2 oz (59.2 kg) IBW/kg (Calculated) : 70.7 Heparin Dosing Weight: 56.9 kg (act BW)  Vital Signs: Temp: 98.6 F (37 C) (08/08 0542) Temp Source: Oral (08/08 0542) BP: 147/99 (08/08 0542) Pulse Rate: 96 (08/08 0542)  Labs:  Recent Labs  08/02/17 0740  08/02/17 1918 08/03/17 0405 08/04/17 0741  HGB 9.5*  --   --  9.0* 9.3*  HCT 29.4*  --   --  28.1* 28.8*  PLT 99*  --   --  100* 104*  HEPARINUNFRC  --   < > 0.48 0.53 0.40  CREATININE 0.76  --   --  0.78 0.81  < > = values in this interval not displayed. Estimated Creatinine Clearance: 83.2 mL/min (by C-G formula based on SCr of 0.81 mg/dL).  Medical History: Past Medical History:  Diagnosis Date  . Acute deep vein thrombosis (DVT) of right lower extremity ([place]) 02/18/2017  . Malignant neoplasm of abdomen ([place])    [name] 01/11/2017  . Microcytic anemia    /notes 01/11/2017    Medications:  Infusions:  . dextrose 5 % and 0.9 % NaCl with KCl 20 mEq/L 85 mL/hr at 08/04/17 0600  . [MAR Hold] piperacillin-tazobactam (ZOSYN)  IV 3.375 g (08/04/17 0805)  . Marland KitchenTPN (CLINIMIX-E) Adult 40 mL/hr at 08/04/17 0600   Assessment: Patient's a 56 y.o M with hx DVT and colonic obstruction with this admission currently on heparin drip while xarelto is on hold.  Significant events:  8/7: Sigmoidoscopy- Malignant completely obstructing tumor in the descending colon. 8/8: diverting colostomy -- heparin drip turned off at 0730 for procedure  Today, 08/04/2017 - heparin drip turned off this AM for OR - heparin level was 0.40 (drawn 10 minutes after heparin drip was d/ced) - cbc relatively stable  Goal of Therapy:  Heparin level 0.3-0.7 units/ml aPTT 66-102 seconds Monitor platelets by anticoagulation protocol: Yes   Plan:   MD -- please advise  when heparin drip can be resumed after surgical procedure today  Monitor for signs and symptoms of bleed   Dia Sitter, PharmD, BCPS 08/04/2017 10:50 AM

## 2017-08-04 NOTE — Progress Notes (Signed)
PROGRESS NOTE  Jeff [name] ZOX:[id number] [name]: [birth year] [name]: 07/29/2017 PCP: [name], MD   LOS: 6 days   Brief Narrative / Interim history: 56 y.o.malewith medical history significant for metastatic colon cancer on chemotherapy, pancreatic abscess with percutaneous drain, iron deficiency anemia, and history of right leg DVT on Xarelto, now presenting to the emergency department for evaluation of 3 days of abdominal pain with nausea, vomiting, and anorexia. Patient reports that he was in his usual state until approximately 3 days ago when he noted the fairly sudden onset of pain in the mid abdomen with nausea. He went on to develop nonbloody nonbilious vomiting, had a couple loose stools, and has had loss of appetite. CT of the abdomen and pelvis at the time of admission revealed a bowel obstruction at the level of the descending colon at the location of the percutaneous drainage catheter. There was wall thickening secondary to colonic neoplasm. There was dilated fluid-filled appendix, similar to the dilated loops of colon and small bowel, compatible with dilatation due to the bowel obstruction.  Mild decrease in size of the multiloculated fluid collection in the splenic hilum and distal tail of the pancreas. GI, General Surgery and Med/Onc were consulted to assist in management. On 08/03/2017, there was an unsuccessful attempt to endoscopically stent colon.  Now surgical intervention is planned.  The patient was recently discharged after admission from 07/09/2017 through 07/14/2017 during which the patient was treated for sepsis secondary to pancreatic Abscess. The Patient Had a Percutaneous Drain Placed by IR on 07/11/2017, and He Was Subsequently [facility] with Amox/Clav. IR tube changed on 07/19/17.  Assessment & Plan: Principal Problem:   Bowel obstruction (HCC) Active Problems:   Primary colon cancer with metastasis to other site ([place])   Anemia, iron deficiency   History of deep  vein thrombosis (DVT) of lower extremity   Intra-abdominal abscess    Pancreatic abscess   Malignant neoplasm of splenic flexure (HCC)   Malnutrition of moderate degree   Severe malnutrition (HCC)   Colon obstruction (HCC)   Colonic obstruction -likely related in the setting of colon cancer, his obstruction is at the level of splenic flexure.  General surgery consulted, patient will be taken to the operating room on 5/8 for diverting colostomy.  He is a poor surgical candidate. -Gastroenterology was also consulted, and [name] attempted a stent placement on 8/7 which was unsuccessful.  Metastatic colon adenocarcinoma -diagnosed 01/12/17, on palliative chemotherapy FOLFOX and panitumumab, last cycle on 07/06/2017.  He is seeing [name], consulted.  He has metastatic disease to the pancreas, spleen as well as peritoneal metastasis.  Pancreatic abscess -perc drain changed 07/19/17--management per surgery/IR, will likely need to be followed up in drain clinic as an outpatient.  Appreciate interventional radiology.  Continue Zosyn.  Cultures from 07/11/17 showed Bacteroides Ovatus  DVT in right lower extremity -diagnosed 02/17/17, on home rivaroxaban, now on heparin infusion periprocedural  Anemia and pancytopenia secondary to chemotherapy -Monitor CBC, currently hemoglobin 9.3, stable over the last few days  Severe malnutrition -appreciate nutrition eval, start supplements when able to tolerate po, for now continue TPN  Hypokalemia -replete, -check mag--1.9  Goals of Care -palliative medicine consulted -remains full code   DVT prophylaxis: Heparin infusion Code Status: Full code Family Communication: No family at bedside Disposition Plan: Home versus SNF when ready, to have surgery today  Consultants:   General surgery  Gastroenterology  Interventional radiology  Procedures:   None   Antimicrobials:  Zosyn  8/2 >>   Subjective: -States that he is felt "better than he  came in", minimal abdominal pain, no nausea or vomiting.  States that he has had small bowel movement last night.  No fever or chills.  No chest pain, shortness of breath.  He asked me if he really needs surgery since he had a small bowel movement  Objective: Vitals:   08/03/17 1646 08/03/17 1834 08/03/17 2137 08/04/17 0542  BP: (!) 175/121 (!) 150/95 (!) 145/98 (!) 147/99  Pulse: 81 (!) 101 93 96  Resp:  (!) 24 (!) 22 (!) 22  Temp:  98.5 F (36.9 C) 99.4 F (37.4 C) 98.6 F (37 C)  TempSrc:   Oral Oral  SpO2:  98% 99% 100%  Weight:      Height:        Intake/Output Summary (Last 24 hours) at 08/04/17 1337 Last data filed at 08/04/17 1328  Gross per 24 hour  Intake          4713.84 ml  Output             1895 ml  Net          2818.84 ml   Filed Weights   07/31/17 0646 08/02/17 0533 08/03/17 0500  Weight: 55.3 kg (121 lb 14.6 oz) 55.6 kg (122 lb 9.2 oz) 59.2 kg (130 lb 8.2 oz)    Examination:  Vitals:   08/03/17 1646 08/03/17 1834 08/03/17 2137 08/04/17 0542  BP: (!) 175/121 (!) 150/95 (!) 145/98 (!) 147/99  Pulse: 81 (!) 101 93 96  Resp:  (!) 24 (!) 22 (!) 22  Temp:  98.5 F (36.9 C) 99.4 F (37.4 C) 98.6 F (37 C)  TempSrc:   Oral Oral  SpO2:  98% 99% 100%  Weight:      Height:        Constitutional: NAD, thin appearing Eyes:  lids and conjunctivae normal ENMT: Mucous membranes are moist. Respiratory: clear to auscultation bilaterally, no wheezing, no crackles. Normal respiratory effort. No accessory muscle use.  Cardiovascular: Regular rate and rhythm, no murmurs / rubs / gallops. No LE edema.  Abdomen: no tenderness. Bowel sounds positive.  Neurologic: Nonfocal, ambulatory  Data Reviewed: I have independently reviewed following labs and imaging studies   CBC:  Recent Labs Lab 07/31/17 0515 08/01/17 0443 08/02/17 0740 08/03/17 0405 08/04/17 0741  WBC 7.5 8.3 6.2 6.2 8.1  NEUTROABS  --   --   --   --  5.5  HGB 9.6* 9.5* 9.5* 9.0* 9.3*  HCT 28.8*  29.7* 29.4* 28.1* 28.8*  MCV 84.7 86.3 87.0 87.5 85.0  PLT 114* 104* 99* 100* 709*   Basic Metabolic Panel:  Recent Labs Lab 07/30/17 0525 07/31/17 0515 08/01/17 0443 08/02/17 0740 08/03/17 0405 08/04/17 0741  NA 138 142 143 143 144 140  K 3.9 3.4* 3.8 3.7 3.8 3.6  CL 105 111 109 110 112* 109  CO2 26 24 26 27 26 25   GLUCOSE 103* 113* 128* 122* 118* 127*  BUN 13 9 6  <5* <5* 5*  CREATININE 0.84 0.66 0.89 0.76 0.78 0.81  CALCIUM 8.5* 8.8* 9.1 9.0 8.9 8.8*  MG 1.7  --  1.7 1.9 1.6* 1.7  PHOS  --   --   --   --   --  4.0   GFR: Estimated Creatinine Clearance: 83.2 mL/min (by C-G formula based on SCr of 0.81 mg/dL). Liver Function Tests:  Recent Labs Lab 07/29/17 1122 07/30/17 0525 08/04/17 6283  AST 23 16 15   ALT 10* 7* 6*  ALKPHOS 62 46 40  BILITOT 0.7 0.8 0.4  PROT 9.0* 7.2 7.4  ALBUMIN 3.7 3.1* 3.0*    Recent Labs Lab 07/29/17 1122  LIPASE 14   No results for input(s): AMMONIA in the last 168 hours. Coagulation Profile: No results for input(s): INR, PROTIME in the last 168 hours. Cardiac Enzymes: No results for input(s): CKTOTAL, CKMB, CKMBINDEX, TROPONINI in the last 168 hours. BNP (last 3 results) No results for input(s): PROBNP in the last 8760 hours. HbA1C: No results for input(s): HGBA1C in the last 72 hours. CBG:  Recent Labs Lab 08/03/17 1726 08/04/17 0016 08/04/17 0546 08/04/17 1034  GLUCAP 85 121* 128* 90   Lipid Profile:  Recent Labs  08/04/17 0752  TRIG 445*   Thyroid Function Tests: No results for input(s): TSH, T4TOTAL, FREET4, T3FREE, THYROIDAB in the last 72 hours. Anemia Panel: No results for input(s): VITAMINB12, FOLATE, FERRITIN, TIBC, IRON, RETICCTPCT in the last 72 hours. Urine analysis:    Component Value Date/Time   COLORURINE YELLOW 07/10/2017 0045   APPEARANCEUR CLEAR 07/10/2017 0045   LABSPEC 1.009 07/10/2017 0045   PHURINE 5.0 07/10/2017 0045   GLUCOSEU NEGATIVE 07/10/2017 0045   HGBUR MODERATE (A) 07/10/2017  0045   BILIRUBINUR NEGATIVE 07/10/2017 0045   KETONESUR NEGATIVE 07/10/2017 0045   PROTEINUR NEGATIVE 07/10/2017 0045   NITRITE NEGATIVE 07/10/2017 0045   LEUKOCYTESUR NEGATIVE 07/10/2017 0045   Sepsis Labs: Invalid input(s): PROCALCITONIN, LACTICIDVEN  No results found for this or any previous visit (from the past 240 hour(s)).    Radiology Studies: Dg Abd Portable 1v  Result Date: 08/03/2017 CLINICAL DATA:  NG tube placement. EXAM: PORTABLE ABDOMEN - 1 VIEW FINDINGS: NG tube with tip in the gastric antrum. No dilated to large or small bowel. Percutaneous nephrostomy tube on the LEFT. IMPRESSION: NG tube with tip in the gastric antrum. Electronically Signed   By: Suzy Bouchard M.D.   On: 08/03/2017 14:29     Scheduled Meds: . [MAR Hold] insulin aspart  0-9 Units Subcutaneous Q6H  . [MAR Hold] sodium chloride flush  10-40 mL Intracatheter Q12H  . [MAR Hold] sodium phosphate  1 enema Rectal Once   Continuous Infusions: . Marland KitchenTPN (CLINIMIX-E) Adult    . dextrose 5 % and 0.9 % NaCl with KCl 20 mEq/L 85 mL/hr at 08/04/17 0600  . dextrose 5 % and 0.9 % NaCl with KCl 20 mEq/L    . magnesium sulfate 1 - 4 g bolus IVPB    . [MAR Hold] piperacillin-tazobactam (ZOSYN)  IV 3.375 g (08/04/17 0805)  . potassium chloride    . Marland KitchenTPN (CLINIMIX-E) Adult 40 mL/hr at 08/04/17 0600     Marzetta Board, MD, PhD [facility] Pager [phone]  If 7PM-7AM, please contact night-coverage [email or web address] Password [email or web address] 08/04/2017, 1:37 PM

## 2017-08-04 NOTE — Progress Notes (Signed)
1 Day Post-Op    CC:  Abdominal pain, nausea, vomiting and diarrhea, decreased appetite.   Subjective: He is up in the chair. Abdomen still distended. Sump occluded and fixed, new filter placed.  He is complaining of some new pain left thigh that started last evening and persisted throughout the night and this a.m. Heparin has been discontinued by the nursing staff, he is scheduled for surgery later this a.m.  Objective: Vital signs in last 24 hours: Temp:  [97.5 F (36.4 C)-99.4 F (37.4 C)] 98.6 F (37 C) (08/08 0542) Pulse Rate:  [79-101] 96 (08/08 0542) Resp:  [20-24] 22 (08/08 0542) BP: (145-192)/(95-131) 147/99 (08/08 0542) SpO2:  [95 %-100 %] 100 % (08/08 0542) Last BM Date: 08/03/17 NPO 3533 IV 1850 Urine 170 NG Drain 9 BM x 1 Afebrile, Tachycardic 90-100 range, BP up some diastolic 88-891; saturations OK Labs yesterday:  OK  Intake/Output from previous day: 08/07 0701 - 08/08 0700 In: 3533.4 [I.V.:3383.4; IV Piggyback:150] Out: 2029 [Urine:1850; Emesis/NG output:170; Drains:9] Intake/Output this shift: No intake/output data recorded.  General appearance: alert, cooperative, no distress and Complaining of new pain left thigh. Resp: clear to auscultation bilaterally and Breath sounds down some in the bases. GI: Distended, NG draining green bilious fluid. No bowel sounds. He did have a small bowel movement yesterday.  Lab Results:   Recent Labs  08/02/17 0740 08/03/17 0405  WBC 6.2 6.2  HGB 9.5* 9.0*  HCT 29.4* 28.1*  PLT 99* 100*    BMET  Recent Labs  08/02/17 0740 08/03/17 0405  NA 143 144  K 3.7 3.8  CL 110 112*  CO2 27 26  GLUCOSE 122* 118*  BUN <5* <5*  CREATININE 0.76 0.78  CALCIUM 9.0 8.9   PT/INR No results for input(s): LABPROT, INR in the last 72 hours.   Recent Labs Lab 07/29/17 1122 07/30/17 0525  AST 23 16  ALT 10* 7*  ALKPHOS 62 46  BILITOT 0.7 0.8  PROT 9.0* 7.2  ALBUMIN 3.7 3.1*     Lipase     Component Value  Date/Time   LIPASE 14 07/29/2017 1122     Medications: . insulin aspart  0-9 Units Subcutaneous Q6H  . sodium chloride flush  10-40 mL Intracatheter Q12H  . sodium phosphate  1 enema Rectal Once   . dextrose 5 % and 0.9 % NaCl with KCl 20 mEq/L 85 mL/hr at 08/04/17 0600  . heparin 1,150 Units/hr (08/04/17 0200)  . piperacillin-tazobactam (ZOSYN)  IV Stopped (08/04/17 0434)  . Marland KitchenTPN (CLINIMIX-E) Adult 40 mL/hr at 08/04/17 0600   Anti-infectives    Start     Dose/Rate Route Frequency Ordered Stop   07/29/17 2359  piperacillin-tazobactam (ZOSYN) IVPB 3.375 g     3.375 g 12.5 mL/hr over 240 Minutes Intravenous Every 8 hours 07/29/17 1729     07/29/17 1730  piperacillin-tazobactam (ZOSYN) IVPB 3.375 g     3.375 g 100 mL/hr over 30 Minutes Intravenous  Once 07/29/17 1728 07/29/17 1853      Assessment/Plan Readmit 07/29/17 with SBO-metastatic cancer into spleen and pancreas Flexible sigmoidoscopy with Attempted stent placement 08/03/17 [name] 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 Barry Dienes as previously planned. Sepsis IR Drain placement 07/11/17 DVT RLE on Xarelto at home.  Heparin drip in hospital - Held around 7:15 AM. Severe PCM  - prealbumin 6.4 on 08/01/17 Depression FEN: NPO/TNA IV fluids ID:  Cipro/Flagyl 7/13-7/14 - Zosyn 7/14-7/16  -  Ceftriaxone/Flagyl IV 7/16 =>>Augmentin 7/18  =>> Zosyn 07/29/17 day 7 Pancreatic abscess with IR drain placement:  MULTIPLE ORGANISMS PRESENT, NONE PREDOMINANT, BACTEROIDES OVATUS , BETA LACTAMASE NEGATIVE on 07/11/17 DVT: Heparin   Plan: Scheduled for diverting colostomy today, in the right upper quadrant or mid transverse colon area.     LOS: 6 days    Jeff [name] 08/04/2017 [phone]

## 2017-08-05 ENCOUNTER — Encounter (HOSPITAL_COMMUNITY): Payer: Self-pay | Admitting: General Surgery

## 2017-08-05 LAB — CBC
HCT: 27.9 % — ABNORMAL LOW (ref 39.0–52.0)
Hemoglobin: 9.2 g/dL — ABNORMAL LOW (ref 13.0–17.0)
MCH: 27.6 pg (ref 26.0–34.0)
MCHC: 33 g/dL (ref 30.0–36.0)
MCV: 83.8 fL (ref 78.0–100.0)
PLATELETS: 115 10*3/uL — AB (ref 150–400)
RBC: 3.33 MIL/uL — AB (ref 4.22–5.81)
RDW: 15 % (ref 11.5–15.5)
WBC: 11.1 10*3/uL — ABNORMAL HIGH (ref 4.0–10.5)

## 2017-08-05 LAB — HEPARIN LEVEL (UNFRACTIONATED)
HEPARIN UNFRACTIONATED: 0.24 [IU]/mL — AB (ref 0.30–0.70)
Heparin Unfractionated: 0.25 IU/mL — ABNORMAL LOW (ref 0.30–0.70)

## 2017-08-05 LAB — GLUCOSE, CAPILLARY
Glucose-Capillary: 109 mg/dL — ABNORMAL HIGH (ref 65–99)
Glucose-Capillary: 118 mg/dL — ABNORMAL HIGH (ref 65–99)
Glucose-Capillary: 125 mg/dL — ABNORMAL HIGH (ref 65–99)
Glucose-Capillary: 126 mg/dL — ABNORMAL HIGH (ref 65–99)

## 2017-08-05 LAB — COMPREHENSIVE METABOLIC PANEL
ALK PHOS: 36 U/L — AB (ref 38–126)
ALT: 7 U/L — AB (ref 17–63)
ANION GAP: 6 (ref 5–15)
AST: 18 U/L (ref 15–41)
Albumin: 2.6 g/dL — ABNORMAL LOW (ref 3.5–5.0)
BILIRUBIN TOTAL: 0.4 mg/dL (ref 0.3–1.2)
BUN: 12 mg/dL (ref 6–20)
CALCIUM: 8.4 mg/dL — AB (ref 8.9–10.3)
CO2: 25 mmol/L (ref 22–32)
CREATININE: 0.75 mg/dL (ref 0.61–1.24)
Chloride: 104 mmol/L (ref 101–111)
Glucose, Bld: 144 mg/dL — ABNORMAL HIGH (ref 65–99)
Potassium: 4.3 mmol/L (ref 3.5–5.1)
SODIUM: 135 mmol/L (ref 135–145)
TOTAL PROTEIN: 6.3 g/dL — AB (ref 6.5–8.1)

## 2017-08-05 LAB — MAGNESIUM: MAGNESIUM: 1.5 mg/dL — AB (ref 1.7–2.4)

## 2017-08-05 LAB — PHOSPHORUS: PHOSPHORUS: 3.6 mg/dL (ref 2.5–4.6)

## 2017-08-05 LAB — TRIGLYCERIDES: TRIGLYCERIDES: 29 mg/dL (ref <150)

## 2017-08-05 MED ORDER — MAGNESIUM SULFATE 2 GM/50ML IV SOLN
2.0000 g | Freq: Once | INTRAVENOUS | Status: AC
  Administered 2017-08-05: 2 g via INTRAVENOUS
  Filled 2017-08-05: qty 50

## 2017-08-05 MED ORDER — HEPARIN (PORCINE) IN NACL 100-0.45 UNIT/ML-% IJ SOLN
1300.0000 [IU]/h | INTRAMUSCULAR | Status: DC
  Administered 2017-08-05: 1300 [IU]/h via INTRAVENOUS
  Administered 2017-08-05: 1200 [IU]/h via INTRAVENOUS
  Administered 2017-08-06 – 2017-08-08 (×3): 1300 [IU]/h via INTRAVENOUS
  Filled 2017-08-05 (×6): qty 250

## 2017-08-05 MED ORDER — TRACE MINERALS CR-CU-MN-SE-ZN 10-1000-500-60 MCG/ML IV SOLN
INTRAVENOUS | Status: AC
  Administered 2017-08-05: 18:00:00 via INTRAVENOUS
  Filled 2017-08-05: qty 1800

## 2017-08-05 MED ORDER — DEXTROSE 5 % IV SOLN
500.0000 mg | Freq: Three times a day (TID) | INTRAVENOUS | Status: AC
  Administered 2017-08-05 – 2017-08-07 (×6): 500 mg via INTRAVENOUS
  Filled 2017-08-05 (×2): qty 5
  Filled 2017-08-05 (×3): qty 550
  Filled 2017-08-05 (×2): qty 5
  Filled 2017-08-05: qty 550

## 2017-08-05 MED ORDER — HYDROMORPHONE HCL-NACL 0.5-0.9 MG/ML-% IV SOSY
1.0000 mg | PREFILLED_SYRINGE | INTRAVENOUS | Status: DC | PRN
  Administered 2017-08-05 – 2017-08-09 (×28): 1 mg via INTRAVENOUS
  Administered 2017-08-10: 2 mg via INTRAVENOUS
  Administered 2017-08-10 (×2): 1 mg via INTRAVENOUS
  Administered 2017-08-10: 2 mg via INTRAVENOUS
  Administered 2017-08-10: 1 mg via INTRAVENOUS
  Administered 2017-08-11 (×4): 2 mg via INTRAVENOUS
  Filled 2017-08-05 (×5): qty 2
  Filled 2017-08-05: qty 4
  Filled 2017-08-05 (×5): qty 2
  Filled 2017-08-05: qty 4
  Filled 2017-08-05 (×3): qty 2
  Filled 2017-08-05: qty 4
  Filled 2017-08-05 (×3): qty 2
  Filled 2017-08-05: qty 4
  Filled 2017-08-05 (×5): qty 2
  Filled 2017-08-05: qty 4
  Filled 2017-08-05: qty 2
  Filled 2017-08-05: qty 4
  Filled 2017-08-05: qty 2
  Filled 2017-08-05: qty 4
  Filled 2017-08-05 (×8): qty 2

## 2017-08-05 MED ORDER — KCL IN DEXTROSE-NACL 20-5-0.9 MEQ/L-%-% IV SOLN
INTRAVENOUS | Status: DC
  Administered 2017-08-05 – 2017-08-10 (×7): via INTRAVENOUS
  Filled 2017-08-05 (×10): qty 1000

## 2017-08-05 MED ORDER — FAT EMULSION 20 % IV EMUL
240.0000 mL | INTRAVENOUS | Status: AC
  Administered 2017-08-05: 240 mL via INTRAVENOUS
  Filled 2017-08-05: qty 250

## 2017-08-05 NOTE — Progress Notes (Signed)
PROGRESS NOTE  CAMEO SHEWELL MHD:[id number] [name]: 22-Jan-1961 [name]: 07/29/2017 PCP: [name], MD   LOS: 7 days   Brief Narrative / Interim history: 56 y.o.malewith medical history significant for metastatic colon cancer on chemotherapy, pancreatic abscess with percutaneous drain, iron deficiency anemia, and history of right leg DVT on Xarelto, now presenting to the emergency department for evaluation of 3 days of abdominal pain with nausea, vomiting, and anorexia. Patient reports that he was in his usual state until approximately 3 days ago when he noted the fairly sudden onset of pain in the mid abdomen with nausea. He went on to develop nonbloody nonbilious vomiting, had a couple loose stools, and has had loss of appetite. CT of the abdomen and pelvis at the time of admission revealed a bowel obstruction at the level of the descending colon at the location of the percutaneous drainage catheter. There was wall thickening secondary to colonic neoplasm. There was dilated fluid-filled appendix, similar to the dilated loops of colon and small bowel, compatible with dilatation due to the bowel obstruction.  Mild decrease in size of the multiloculated fluid collection in the splenic hilum and distal tail of the pancreas. GI, General Surgery and Med/Onc were consulted to assist in management. On 08/03/2017, there was an unsuccessful attempt to endoscopically stent colon.  Now surgical intervention is planned.  The patient was recently discharged after admission from 07/09/2017 through 07/14/2017 during which the patient was treated for sepsis secondary to pancreatic Abscess. The Patient Had a Percutaneous Drain Placed by IR on 07/11/2017, and He Was Subsequently [facility] with Amox/Clav. IR tube changed on 07/19/17.  Assessment & Plan: Principal Problem:   Bowel obstruction (HCC) Active Problems:   Primary colon cancer with metastasis to other site ([place])   Anemia, iron deficiency   History of deep  vein thrombosis (DVT) of lower extremity   Intra-abdominal abscess    Pancreatic abscess   Malignant neoplasm of splenic flexure (HCC)   Malnutrition of moderate degree   Severe malnutrition (HCC)   Colon obstruction (HCC)   Colonic obstruction -likely related in the setting of colon cancer, his obstruction is at the level of splenic flexure.  -Gastroenterology was consulted, and [name] attempted a stent placement on 8/7 which was unsuccessful. -Given this, general surgery took patient to the operating room on 8/8 and patient underwent a transverse loop colostomy, bypassing the metastatic left colon cancer altogether -Postop recovery per general surgery  Metastatic colon adenocarcinoma -diagnosed 01/12/17, on palliative chemotherapy FOLFOX and panitumumab, last cycle on 07/06/2017.  He is seeing [name], consulted.  He has metastatic disease to the pancreas, spleen as well as peritoneal metastasis.  Overall very poor prognosis  Pancreatic abscess -perc drain changed 07/19/17--management per surgery/IR, will likely need to be followed up in drain clinic as an outpatient.  Appreciate interventional radiology.  Continue Zosyn.  Cultures from 07/11/17 showed Bacteroides Ovatus  DVT in right lower extremity -diagnosed 02/17/17, on home rivaroxaban, now on heparin infusion periprocedural.  Resume Xarelto per surgery  Anemia and pancytopenia secondary to chemotherapy -Monitor CBC, currently hemoglobin 9.3 >> 9.2, stable over the last few days  Severe malnutrition -appreciate nutrition eval, start supplements when able to tolerate po, for now continue TPN  Hypokalemia -replete, -check mag--1.5 today. Replete  Goals of Care -remains full code   DVT prophylaxis: Heparin infusion Code Status: Full code Family Communication: No family at bedside Disposition Plan: Home versus SNF when ready, discharge readiness per general surgery postop  Consultants:   General  surgery  Gastroenterology  Interventional radiology  Procedures:   None   Antimicrobials:  Zosyn 8/2 >>   Subjective: -Pain relatively well controlled, no nausea or vomiting.  No chest pain or shortness of breath.  Objective: Vitals:   08/04/17 2220 08/05/17 0127 08/05/17 0604 08/05/17 1018  BP: (!) 156/102 (!) 150/98 (!) 159/112 (!) 148/94  Pulse: (!) 107 (!) 104 (!) 117 (!) 104  Resp: 14 14 16 16   Temp: 98.7 F (37.1 C) 99.7 F (37.6 C) 99.6 F (37.6 C) 99.8 F (37.7 C)  TempSrc: Oral Oral Oral Oral  SpO2: 100% 98% 97% 99%  Weight:      Height:        Intake/Output Summary (Last 24 hours) at 08/05/17 1314 Last data filed at 08/05/17 1005  Gross per 24 hour  Intake          3174.29 ml  Output             3040 ml  Net           134.29 ml   Filed Weights   07/31/17 0646 08/02/17 0533 08/03/17 0500  Weight: 55.3 kg (121 lb 14.6 oz) 55.6 kg (122 lb 9.2 oz) 59.2 kg (130 lb 8.2 oz)    Examination:  Vitals:   08/04/17 2220 08/05/17 0127 08/05/17 0604 08/05/17 1018  BP: (!) 156/102 (!) 150/98 (!) 159/112 (!) 148/94  Pulse: (!) 107 (!) 104 (!) 117 (!) 104  Resp: 14 14 16 16   Temp: 98.7 F (37.1 C) 99.7 F (37.6 C) 99.6 F (37.6 C) 99.8 F (37.7 C)  TempSrc: Oral Oral Oral Oral  SpO2: 100% 98% 97% 99%  Weight:      Height:        Constitutional: NAD Eyes: lids and conjunctivae normal Respiratory: clear to auscultation bilaterally, no wheezing, no crackles. Normal respiratory effort.  Cardiovascular: Regular rate and rhythm, no murmurs / rubs / gallops. No LE edema. 2+ pedal pulses.  Abdomen: minimal tenderness. Bowel sounds positive.  Ostomy bag in place with dark material coming out Skin: no rashes, lesions, ulcers. No induration Neurologic: non focal   Data Reviewed: I have independently reviewed following labs and imaging studies   CBC:  Recent Labs Lab 08/01/17 0443 08/02/17 0740 08/03/17 0405 08/04/17 0741 08/05/17 0430  WBC 8.3 6.2 6.2  8.1 11.1*  NEUTROABS  --   --   --  5.5  --   HGB 9.5* 9.5* 9.0* 9.3* 9.2*  HCT 29.7* 29.4* 28.1* 28.8* 27.9*  MCV 86.3 87.0 87.5 85.0 83.8  PLT 104* 99* 100* 104* 570*   Basic Metabolic Panel:  Recent Labs Lab 08/01/17 0443 08/02/17 0740 08/03/17 0405 08/04/17 0741 08/05/17 0430  NA 143 143 144 140 135  K 3.8 3.7 3.8 3.6 4.3  CL 109 110 112* 109 104  CO2 26 27 26 25 25   GLUCOSE 128* 122* 118* 127* 144*  BUN 6 <5* <5* 5* 12  CREATININE 0.89 0.76 0.78 0.81 0.75  CALCIUM 9.1 9.0 8.9 8.8* 8.4*  MG 1.7 1.9 1.6* 1.7 1.5*  PHOS  --   --   --  4.0 3.6   GFR: Estimated Creatinine Clearance: 84.3 mL/min (by C-G formula based on SCr of 0.75 mg/dL). Liver Function Tests:  Recent Labs Lab 07/30/17 0525 08/04/17 0741 08/05/17 0430  AST 16 15 18   ALT 7* 6* 7*  ALKPHOS 46 40 36*  BILITOT 0.8 0.4 0.4  PROT 7.2  7.4 6.3*  ALBUMIN 3.1* 3.0* 2.6*   No results for input(s): LIPASE, AMYLASE in the last 168 hours. No results for input(s): AMMONIA in the last 168 hours. Coagulation Profile: No results for input(s): INR, PROTIME in the last 168 hours. Cardiac Enzymes: No results for input(s): CKTOTAL, CKMB, CKMBINDEX, TROPONINI in the last 168 hours. BNP (last 3 results) No results for input(s): PROBNP in the last 8760 hours. HbA1C: No results for input(s): HGBA1C in the last 72 hours. CBG:  Recent Labs Lab 08/04/17 1644 08/04/17 1936 08/04/17 2354 08/05/17 0600 08/05/17 1210  GLUCAP 163* 202* 145* 126* 109*   Lipid Profile:  Recent Labs  08/04/17 0752 08/05/17 0430  TRIG 445* 29   Thyroid Function Tests: No results for input(s): TSH, T4TOTAL, FREET4, T3FREE, THYROIDAB in the last 72 hours. Anemia Panel: No results for input(s): VITAMINB12, FOLATE, FERRITIN, TIBC, IRON, RETICCTPCT in the last 72 hours. Urine analysis:    Component Value Date/Time   COLORURINE YELLOW 07/10/2017 0045   APPEARANCEUR CLEAR 07/10/2017 0045   LABSPEC 1.009 07/10/2017 0045   PHURINE  5.0 07/10/2017 0045   GLUCOSEU NEGATIVE 07/10/2017 0045   HGBUR MODERATE (A) 07/10/2017 0045   BILIRUBINUR NEGATIVE 07/10/2017 0045   KETONESUR NEGATIVE 07/10/2017 0045   PROTEINUR NEGATIVE 07/10/2017 0045   NITRITE NEGATIVE 07/10/2017 0045   LEUKOCYTESUR NEGATIVE 07/10/2017 0045   Sepsis Labs: Invalid input(s): PROCALCITONIN, LACTICIDVEN  No results found for this or any previous visit (from the past 240 hour(s)).    Radiology Studies: Dg Abd Portable 1v  Result Date: 08/03/2017 CLINICAL DATA:  NG tube placement. EXAM: PORTABLE ABDOMEN - 1 VIEW FINDINGS: NG tube with tip in the gastric antrum. No dilated to large or small bowel. Percutaneous nephrostomy tube on the LEFT. IMPRESSION: NG tube with tip in the gastric antrum. Electronically Signed   By: Suzy Bouchard M.D.   On: 08/03/2017 14:29   Scheduled Meds: . insulin aspart  0-9 Units Subcutaneous Q6H  . sodium chloride flush  10-40 mL Intracatheter Q12H  . sodium phosphate  1 enema Rectal Once   Continuous Infusions: . Marland KitchenTPN (CLINIMIX-E) Adult 60 mL/hr at 08/05/17 0600  . dextrose 5 % and 0.9 % NaCl with KCl 20 mEq/L 65 mL/hr at 08/05/17 0600  . dextrose 5 % and 0.9 % NaCl with KCl 20 mEq/L    . Marland KitchenTPN (CLINIMIX-E) Adult     And  . fat emulsion    . heparin 1,200 Units/hr (08/05/17 0600)  . methocarbamol (ROBAXIN)  IV    . piperacillin-tazobactam (ZOSYN)  IV 3.375 g (08/05/17 1035)    Marzetta Board, MD, PhD [facility] Pager [phone]  If 7PM-7AM, please contact night-coverage [email or web address] Password [email or web address] 08/05/2017, 1:14 PM

## 2017-08-05 NOTE — Consult Note (Signed)
[place] Nurse ostomy consult note Stoma type/location: RUQ, loop diverting colostomy Ostomy pouching: 1pc.in place from the OR Patient very flat affect today and avoids eye contact with [place] nurse. His sister is in the room, she is a Adult nurse. He does report his daughter is his caregiver.    Education provided: I tried to explain the creation of the stoma and he is very avoidant of conversation. He has NG tube and he is a little irritated with other visitors while I am in the room. His sister is reluctant to see pouch change but reports his daughter will be able to assist him.  He request that we do pouch change with his daughter present, I have had his sister contact her while I am in the room and she works but can be available to meet me at 46 tomorrow in the patient's room.  I have also contacted the chaplin per his request to see if they can come around the same time to assist him with HCPOA/AD paperwork when his daughter is present. I have called and spoke with [place] who reports that she will relay the message to [place].  Enrolled patient in [place] program: No, card signed but not enrolled until decisions about pouching made.   [place] Nurse will follow along with you for continued support with ostomy teaching and care [place] MSN, RN, [place], [place], [place]

## 2017-08-05 NOTE — Progress Notes (Addendum)
Chaplain following to complete adv directive.     Consult requests contact daughter.  Attempted to phone daughter.  No Answer.  Did not leave voicemail.  Continue to attempt to contact while completing adv. Dir.        Pt is planning on making daughter, Jeff [name] [phone], HCPOA.  Will provide paperwork and education with pt and daughter

## 2017-08-05 NOTE — Progress Notes (Signed)
[facility] Progress Note  1 Day Post-Op  Subjective: CC:  Reports abdominal pain that Is worse with coughing/moving. Having liquid stool in colostomy pouch. Ambulating.  Objective: Vital signs in last 24 hours: Temp:  [97.7 F (36.5 C)-99.7 F (37.6 C)] 99.6 F (37.6 C) (08/09 0604) Pulse Rate:  [104-119] 117 (08/09 0604) Resp:  [13-25] 16 (08/09 0604) BP: (142-173)/(96-119) 159/112 (08/09 0604) SpO2:  [97 %-100 %] 97 % (08/09 0604) Last BM Date: 08/03/17  Intake/Output from previous day: 08/08 0701 - 08/09 0700 In: 4424.3 [I.V.:4124.3; IV Piggyback:300] Out: 3015 [Urine:1725; Emesis/NG output:800; Stool:390; Blood:100] Intake/Output this shift: No intake/output data recorded.  PE: Gen:  Alert, NAD, pleasant Pulm:  Normal effort Abd: Soft, appropriately tender, mild distention, bowel sounds present in all 4 quadrants, colostomy with liquid stool and some flatus in pouch  NGT 800 cc/24h Skin: warm and dry, no rashes  Psych: A&Ox3   Lab Results:   Recent Labs  08/04/17 0741 08/05/17 0430  WBC 8.1 11.1*  HGB 9.3* 9.2*  HCT 28.8* 27.9*  PLT 104* 115*   BMET  Recent Labs  08/04/17 0741 08/05/17 0430  NA 140 135  K 3.6 4.3  CL 109 104  CO2 25 25  GLUCOSE 127* 144*  BUN 5* 12  CREATININE 0.81 0.75  CALCIUM 8.8* 8.4*   PT/INR No results for input(s): LABPROT, INR in the last 72 hours. CMP     Component Value Date/Time   NA 135 08/05/2017 0430   NA 135 (L) 07/26/2017 1014   K 4.3 08/05/2017 0430   K 3.3 (L) 07/26/2017 1014   CL 104 08/05/2017 0430   CO2 25 08/05/2017 0430   CO2 25 07/26/2017 1014   GLUCOSE 144 (H) 08/05/2017 0430   GLUCOSE 129 07/26/2017 1014   BUN 12 08/05/2017 0430   BUN 9.8 07/26/2017 1014   CREATININE 0.75 08/05/2017 0430   CREATININE 1.0 07/26/2017 1014   CALCIUM 8.4 (L) 08/05/2017 0430   CALCIUM 9.3 07/26/2017 1014   PROT 6.3 (L) 08/05/2017 0430   PROT 8.2 07/26/2017 1014   ALBUMIN 2.6 (L) 08/05/2017 0430    ALBUMIN 3.3 (L) 07/26/2017 1014   AST 18 08/05/2017 0430   AST 24 07/26/2017 1014   ALT 7 (L) 08/05/2017 0430   ALT 6 07/26/2017 1014   ALKPHOS 36 (L) 08/05/2017 0430   ALKPHOS 87 07/26/2017 1014   BILITOT 0.4 08/05/2017 0430   BILITOT 0.23 07/26/2017 1014   GFRNONAA >60 08/05/2017 0430   GFRAA >60 08/05/2017 0430   Lipase     Component Value Date/Time   LIPASE 14 07/29/2017 1122       Studies/Results: Dg Abd Portable 1v  Result Date: 08/03/2017 CLINICAL DATA:  NG tube placement. EXAM: PORTABLE ABDOMEN - 1 VIEW FINDINGS: NG tube with tip in the gastric antrum. No dilated to large or small bowel. Percutaneous nephrostomy tube on the LEFT. IMPRESSION: NG tube with tip in the gastric antrum. Electronically Signed   By: Suzy Bouchard M.D.   On: 08/03/2017 14:29    Anti-infectives: Anti-infectives    Start     Dose/Rate Route Frequency Ordered Stop   08/04/17 2200  piperacillin-tazobactam (ZOSYN) IVPB 3.375 g     3.375 g 12.5 mL/hr over 240 Minutes Intravenous Every 8 hours 08/04/17 1706 08/05/17 2159   08/04/17 0951  cefoTEtan in Dextrose 5% (CEFOTAN) 2-2.08 GM-% IVPB    Comments:  Algis Liming   : cabinet override  08/04/17 0951 08/04/17 1102   08/04/17 0945  cefoTEtan in Dextrose 5% (CEFOTAN) IVPB 2 g     2 g Intravenous  Once 08/04/17 0820 08/04/17 1117   08/04/17 0800  cefoTEtan (CEFOTAN) 2 g in dextrose 5 % 50 mL IVPB  Status:  Discontinued     2 g 100 mL/hr over 30 Minutes Intravenous On call to O.R. 08/04/17 0746 08/04/17 0819   07/29/17 2359  piperacillin-tazobactam (ZOSYN) IVPB 3.375 g  Status:  Discontinued     3.375 g 12.5 mL/hr over 240 Minutes Intravenous Every 8 hours 07/29/17 1729 08/04/17 1706   07/29/17 1730  piperacillin-tazobactam (ZOSYN) IVPB 3.375 g     3.375 g 100 mL/hr over 30 Minutes Intravenous  Once 07/29/17 1728 07/29/17 1853     Assessment/Plan SBO 2/2 metastatic adenocarcinoma of colon- Readmit 07/29/17 S/P Flexible sigmoidoscopy  with attempted stent placement 08/03/17 [name], descending colon 100% occluded by tumor and unable to place stent. POD#1 S/P Transverse loop colostomy 08/04/17 Dr. [name] - afebrile, sinus tachycardia - continue NG tube as output is still high. Bowel function is returning. - pain control  - BID wet-to-dry dressing changes. 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 Barry Dienes as previously planned. Sepsis IR Drain placement 07/11/17 DVT RLE on Xarelto at home.  Heparin drip in hospital - Held around 7:15 AM. Severe PCM  - prealbumin 6.4 on 08/01/17 Depression FEN: NPO/TNA IV fluids ID: Cipro/Flagyl 7/13-7/14 - Zosyn 7/14-7/16 - Ceftriaxone/Flagyl IV 7/16- 7/17, Augmentin 7/18, Zosyn 07/29/17 >>, periop cefotetan 8/8 VTE: Heparin gtt resumed post-op 8/8 at 2020   Plan: TNA, wound care, mobilize, WOC consult for ostomy education Continue NG to LIWS, hopeful to D/C NG and start clears 24-48 h. Pt may have ice chips    LOS: 7 days    Jill Alexanders , [facility] 08/05/2017, 7:44 AM Pager: [phone] Consults: [phone] Mon-Fri 7:00 am-4:30 pm Sat-Sun 7:00 am-11:30 am

## 2017-08-05 NOTE — Progress Notes (Signed)
ANTICOAGULATION CONSULT NOTE -   Pharmacy Consult for Heparin Indication: hx DVT- bridge while off Xarelto  No Known Allergies  Patient Measurements: Height: 5\' 9"  (175.3 cm) Weight: 130 lb 8.2 oz (59.2 kg) IBW/kg (Calculated) : 70.7 Heparin Dosing Weight: 56.9 kg (act BW)  Vital Signs: Temp: 99.7 F (37.6 C) (08/09 0127) Temp Source: Oral (08/09 0127) BP: 150/98 (08/09 0127) Pulse Rate: 104 (08/09 0127)  Labs:  Recent Labs  08/02/17 0740  08/03/17 0405 08/04/17 0741 08/05/17 0430  HGB 9.5*  --  9.0* 9.3* 9.2*  HCT 29.4*  --  28.1* 28.8* 27.9*  PLT 99*  --  100* 104* 115*  HEPARINUNFRC  --   < > 0.53 0.40 0.25*  CREATININE 0.76  --  0.78 0.81  --   < > = values in this interval not displayed. Estimated Creatinine Clearance: 83.2 mL/min (by C-G formula based on SCr of 0.81 mg/dL).  Medical History: Past Medical History:  Diagnosis Date  . Acute deep vein thrombosis (DVT) of right lower extremity ([place]) 02/18/2017  . Malignant neoplasm of abdomen ([place])    [name] 01/11/2017  . Microcytic anemia    /notes 01/11/2017    Medications:  Infusions:  . Marland KitchenTPN (CLINIMIX-E) Adult 60 mL/hr at 08/05/17 0200  . dextrose 5 % and 0.9 % NaCl with KCl 20 mEq/L 65 mL/hr at 08/05/17 0200  . heparin    . piperacillin-tazobactam (ZOSYN)  IV Stopped (08/05/17 1610)   Assessment: Patient's a 56 y.o M with hx DVT and colonic obstruction with this admission currently on heparin drip while xarelto is on hold.  Significant events:  8/7: Sigmoidoscopy- Malignant completely obstructing tumor in the descending colon. 8/8: diverting colostomy -- heparin drip turned off at 0730 for procedure  8/8 - heparin drip turned off this AM for OR - heparin level was 0.40 (drawn 10 minutes after heparin drip was d/ced). Heparin infusion had been running at 1150 units/hr.  Have been stable and therapeutic on this rate. - cbc relatively stable - Consult received to resume heparin at 8pm tonight. AET 1341  today so will be resuming heparin infusion ~6 hours from surgery. Today, 8/9 -0430 HL=0.25 units/ml below goal, no infusion issues, some slight bleeding at surgery site.  Goal of Therapy:  Heparin level 0.3-0.7 units/ml aPTT 66-102 seconds Monitor platelets by anticoagulation protocol: Yes   Plan: Will increase heparin drip slightly to 1200 units/hr Check heparin level 6 hours after increasing Daily HL and CBC while on heparin infusion.   Dorrene German 08/05/2017 5:12 AM

## 2017-08-05 NOTE — Progress Notes (Signed)
[facility] NOTE   Pharmacy Consult for TPN Indication: intolerance to enteral feeds  Patient Measurements: Height: 5\' 9"  (175.3 cm) Weight: 130 lb 8.2 oz (59.2 kg) IBW/kg (Calculated) : 70.7 TPN AdjBW (KG): 55.3 Body mass index is 19.27 kg/m.   Insulin Requirements: 3 units yesterday  Current Nutrition: NPO  IVF: D5W NS with 34meq KCl/L at 65 ml/hr  Central access: PICC TPN start date: 8/7  ASSESSMENT                                                                                                          HPI:  56 y.o.malewith medical hx significant for metastatic colon cancer on chemo, pancreatic abscess with percutaneous drain, iron deficiency anemia, and hx of right leg DVT on Xarelto.  Presented to the ED for evaluation of 3 days of abdominal pain with nausea, vomiting, and anorexia. CT of the abdomen and pelvis at the time of admission revealed a bowel obstruction at the level of the descending colon at the location of the percutaneous drainage catheter.  Significant events:  8/7: Sigmoidoscopy- Malignant completely obstructing tumor in the                                    descending colon. 8/8: for colostomy   Today:   Glucose (goal <150); no hx DM; cbgs at goal  Electrolytes:  Mag 1.5, K improved after replacement yesterday, all others WNL  Renal: scr ok  LFTs: ok  TGs: 445 (8/8), 29 (8/9)  Prealbumin - 6.4 (8/4), 9.8 (8/8)  NUTRITIONAL GOALS                                                                                             RD recs (8/7): Kcal:  1880-2050 (33-36 kcal/kg) Protein:  85-97 grams (1.5-1.7 grams/kg)  Clinimix E 5/20 at a goal rate of 65ml/hr + 20% fat emulsion at 38ml/hr x 12 hours  to provide: 90 g/day protein, 2064 Kcal/day.  PLAN  Now: - magnesium sulfate 2 gm IV  x1  At 1800 today:  Increase Clinimix E 5/20 rate to 17mls/hr  Resume 20% fat emulsion at 57ml/hr x 12 hours  TPN to contain standard multivitamins and trace elements.  Reduce IVF to 67ml/hr.  continue sensitive SSI q6h  TPN lab panels on Mondays & Thursdays.  With risk for refeeding syndrome, will order daily CMET, magnesium and phosphorus daily through 08/06/17  Dolly Rias RPh 08/05/2017, 9:47 AM Pager [phone]

## 2017-08-05 NOTE — Progress Notes (Signed)
ANTICOAGULATION CONSULT NOTE -   Pharmacy Consult for Heparin Indication: hx DVT- bridge while off Xarelto  No Known Allergies  Patient Measurements: Height: 5\' 9"  (175.3 cm) Weight: 130 lb 8.2 oz (59.2 kg) IBW/kg (Calculated) : 70.7 Heparin Dosing Weight: 56.9 kg (act BW)  Vital Signs: Temp: 99.5 F (37.5 C) (08/09 1349) Temp Source: Oral (08/09 1349) BP: 150/101 (08/09 1349) Pulse Rate: 102 (08/09 1349)  Labs:  Recent Labs  08/03/17 0405 08/04/17 0741 08/05/17 0430 08/05/17 1349  HGB 9.0* 9.3* 9.2*  --   HCT 28.1* 28.8* 27.9*  --   PLT 100* 104* 115*  --   HEPARINUNFRC 0.53 0.40 0.25* 0.24*  CREATININE 0.78 0.81 0.75  --    Estimated Creatinine Clearance: 84.3 mL/min (by C-G formula based on SCr of 0.75 mg/dL).  Medical History: Past Medical History:  Diagnosis Date  . Acute deep vein thrombosis (DVT) of right lower extremity ([place]) 02/18/2017  . Malignant neoplasm of abdomen ([place])    [name] 01/11/2017  . Microcytic anemia    /notes 01/11/2017    Medications:  Infusions:  . Marland KitchenTPN (CLINIMIX-E) Adult 60 mL/hr at 08/05/17 0600  . dextrose 5 % and 0.9 % NaCl with KCl 20 mEq/L 65 mL/hr at 08/05/17 0600  . dextrose 5 % and 0.9 % NaCl with KCl 20 mEq/L    . Marland KitchenTPN (CLINIMIX-E) Adult     And  . fat emulsion    . heparin 1,200 Units/hr (08/05/17 0600)  . methocarbamol (ROBAXIN)  IV 500 mg (08/05/17 1437)  . piperacillin-tazobactam (ZOSYN)  IV Stopped (08/05/17 1435)   Assessment: Patient's a 56 y.o M with hx DVT and colonic obstruction with this admission currently on heparin drip while xarelto is on hold.  Significant events:  8/7: Sigmoidoscopy- Malignant completely obstructing tumor in the descending colon. 8/8: diverting colostomy -- heparin drip turned off at 0730 for procedure  8/8 -Heparin resumed ~6 hours from surgery.  Today, 8/9 - 0430 HL=0.25 units/ml below goal, no infusion issues, some slight bleeding at surgery site. - 1349 HL=0.24 subtherapeutic  on 1200 units/hr, no infusion issues or bleeding per RN  Goal of Therapy:  Heparin level 0.3-0.7 units/ml aPTT 66-102 seconds Monitor platelets by anticoagulation protocol: Yes   Plan: Will increase heparin drip to 1300 units/hr Check heparin level 6 hours after increasing Daily HL and CBC while on heparin infusion.   Dolly Rias RPh 08/05/2017, 3:08 PM Pager [phone]

## 2017-08-06 ENCOUNTER — Inpatient Hospital Stay (HOSPITAL_COMMUNITY): Payer: Medicaid Other

## 2017-08-06 LAB — COMPREHENSIVE METABOLIC PANEL
ALBUMIN: 2.6 g/dL — AB (ref 3.5–5.0)
ALK PHOS: 45 U/L (ref 38–126)
ALT: 7 U/L — ABNORMAL LOW (ref 17–63)
AST: 20 U/L (ref 15–41)
Anion gap: 7 (ref 5–15)
BILIRUBIN TOTAL: 0.2 mg/dL — AB (ref 0.3–1.2)
BUN: 14 mg/dL (ref 6–20)
CO2: 22 mmol/L (ref 22–32)
Calcium: 8.4 mg/dL — ABNORMAL LOW (ref 8.9–10.3)
Chloride: 100 mmol/L — ABNORMAL LOW (ref 101–111)
Creatinine, Ser: 0.72 mg/dL (ref 0.61–1.24)
GFR calc Af Amer: >60 mL/min (ref >60)
GFR calc non Af Amer: >60 mL/min (ref >60)
Glucose, Bld: 124 mg/dL — ABNORMAL HIGH (ref 65–99)
POTASSIUM: 4.1 mmol/L (ref 3.5–5.1)
SODIUM: 129 mmol/L — AB (ref 135–145)
TOTAL PROTEIN: 6.6 g/dL (ref 6.5–8.1)

## 2017-08-06 LAB — CBC
HEMATOCRIT: 29 % — AB (ref 39.0–52.0)
HEMOGLOBIN: 9.7 g/dL — AB (ref 13.0–17.0)
MCH: 28.3 pg (ref 26.0–34.0)
MCHC: 33.4 g/dL (ref 30.0–36.0)
MCV: 84.5 fL (ref 78.0–100.0)
PLATELETS: 111 10*3/uL — AB (ref 150–400)
RBC: 3.43 MIL/uL — AB (ref 4.22–5.81)
RDW: 15.1 % (ref 11.5–15.5)
WBC: 11.5 10*3/uL — ABNORMAL HIGH (ref 4.0–10.5)

## 2017-08-06 LAB — HEPARIN LEVEL (UNFRACTIONATED)
Heparin Unfractionated: 0.39 IU/mL (ref 0.30–0.70)
Heparin Unfractionated: 0.43 IU/mL (ref 0.30–0.70)

## 2017-08-06 LAB — GLUCOSE, CAPILLARY
GLUCOSE-CAPILLARY: 117 mg/dL — AB (ref 65–99)
GLUCOSE-CAPILLARY: 129 mg/dL — AB (ref 65–99)
Glucose-Capillary: 137 mg/dL — ABNORMAL HIGH (ref 65–99)
Glucose-Capillary: 107 mg/dL — ABNORMAL HIGH (ref 65–99)

## 2017-08-06 LAB — PHOSPHORUS: Phosphorus: 3.3 mg/dL (ref 2.5–4.6)

## 2017-08-06 LAB — MAGNESIUM: Magnesium: 1.6 mg/dL — ABNORMAL LOW (ref 1.7–2.4)

## 2017-08-06 MED ORDER — FAT EMULSION 20 % IV EMUL
240.0000 mL | INTRAVENOUS | Status: AC
Start: 2017-08-06 — End: 2017-08-07
  Administered 2017-08-06: 240 mL via INTRAVENOUS
  Filled 2017-08-06: qty 250

## 2017-08-06 MED ORDER — M.V.I. ADULT IV INJ
INJECTION | INTRAVENOUS | Status: AC
  Administered 2017-08-06: 18:00:00 via INTRAVENOUS
  Filled 2017-08-06: qty 1800

## 2017-08-06 MED ORDER — CHLORHEXIDINE GLUCONATE CLOTH 2 % EX PADS
6.0000 | MEDICATED_PAD | Freq: Every day | CUTANEOUS | Status: DC
  Administered 2017-08-09 – 2017-08-12 (×2): 6 via TOPICAL

## 2017-08-06 MED ORDER — IPRATROPIUM-ALBUTEROL 0.5-2.5 (3) MG/3ML IN SOLN
3.0000 mL | Freq: Four times a day (QID) | RESPIRATORY_TRACT | Status: DC | PRN
  Administered 2017-08-06: 3 mL via RESPIRATORY_TRACT
  Filled 2017-08-06: qty 3

## 2017-08-06 MED ORDER — MAGNESIUM SULFATE 2 GM/50ML IV SOLN
2.0000 g | Freq: Once | INTRAVENOUS | Status: AC
  Administered 2017-08-06: 2 g via INTRAVENOUS
  Filled 2017-08-06: qty 50

## 2017-08-06 NOTE — Progress Notes (Signed)
Late entry. Pt's lung sounds upon assessment had audible wheezing and crackles. Encouraged turn, cough, deep breathe, IS, and ambulation. Spoke with Dr. Carney Harder and he placed orders. Will continue to monitor pt.  Notified RT pt needs neb treatment. They are sending someone up to pt's room.

## 2017-08-06 NOTE — Progress Notes (Signed)
Follow up with Mr Trapp re: Advance Directives.  Spoke with Mr. Gloss directly and he wishes to name daughter as Forensic scientist.   Provided education around process, left paperwork with Mr. Woodrick.  His family is going to be present this afternoon and he wishes to fill out paperwork then.   Chaplain will attempt to be present to notarize when family is on campus.  Informed Mr. Cregger that daughter does not need to be present to notarize.    Mr. Matthis had questions about durable power of attorney related to his social security check.  Chaplain informed that advance directive only covers health care decisions.  Mr. Borum voiced understanding.      WL / [place], MDiv

## 2017-08-06 NOTE — Progress Notes (Signed)
[facility] NOTE   Pharmacy Consult for TPN Indication: intolerance to enteral feeds  Patient Measurements: Height: 5\' 9"  (175.3 cm) Weight: 141 lb 15.6 oz (64.4 kg) IBW/kg (Calculated) : 70.7 TPN AdjBW (KG): 55.3 Body mass index is 20.97 kg/m.   Insulin Requirements: 2 units Novolog SSI in past 24 hrs  Current Nutrition: NPO  IVF: D5NS + KCl 20 mEq/L ordered at 50 mL/hr but currently running at 65 mL/hr  Central access: PICC TPN start date: 8/7  ASSESSMENT                                                                                                          HPI:  56 y.o.malewith medical hx significant for metastatic colon cancer, on chemo, pancreatic abscess with percutaneous drain, iron deficiency anemia, and hx of right leg DVT, on Xarelto.  Presented to the ED for evaluation of 3 days of abdominal pain with nausea, vomiting, and anorexia. CT of the abdomen and pelvis at the time of admission revealed a bowel obstruction at the level of the descending colon at the location of the percutaneous drainage catheter.  Significant events:  8/7: Sigmoidoscopy- Malignant completely obstructing tumor in the                                    descending colon. 8/8: Transverse Loop Colostomy   Recent Labs  08/04/17 0752 08/05/17 0430 08/06/17 0539  NA  --  135 129*  K  --  4.3 4.1  CL  --  104 100*  CO2  --  25 22  GLUCOSE  --  144* 124*  BUN  --  12 14  CREATININE  --  0.75 0.72  CALCIUM  --  8.4* 8.4*  PHOS  --  3.6 3.3  MG  --  1.5* 1.6*  ALBUMIN  --  2.6* 2.6*  ALKPHOS  --  36* 45  AST  --  18 20  ALT  --  7* 7*  BILITOT  --  0.4 0.2*  TRIG 445* 29  --   PREALBUMIN 9.8*  --   --   Corr Ca 9.5   Today:   Glucose (goal <150): no hx DM; cbgs at goal  Electrolytes:  Mg improved but still below LLN after 2 grams IV x 1 yesterday; K repleted, WNL; Na slightly low.  Renal: SCr WNL, stable  LFTs: below ULN  TGs: 445  (8/8), 29 (8/9)  Prealbumin - 6.4 (8/4), 9.8 (8/8)  I/O (-) 0.9 L ;  NG output 850 mL/24 hr   NUTRITIONAL GOALS  RD recs (8/7): Kcal:  1880-2050 (33-36 kcal/kg) Protein:  85-97 grams (1.5-1.7 grams/kg)  Clinimix E 5/20 at a goal rate of 32ml/hr + 20% fat emulsion at 37ml/hr x 12 hours  to provide: 90 g/day protein, 2064 Kcal/day.  PLAN                                                                                                                         This AM:  Magnesium sulfate 2 gm IV x1  At 1800 today:  Continue Clinimix E 5/20 rate at goal rate of 75 mL/hr  Continue 20% fat emulsion at 68ml/hr x 12 hours daily  TPN to contain standard multivitamins and trace elements.  Continue sensitive SSI q6h  Given NG output and hyponatremia, continue mIVF at 65 mL/hr  TPN lab panels on Mondays & Thursdays.  BMet, Mg, Phos tomorrow AM.  Clayburn Pert, PharmD, BCPS Pager: [phone] 08/06/2017  7:40 AM

## 2017-08-06 NOTE — Progress Notes (Signed)
Completed Advance Directive with patient.    Notarized copy placed in chart.  Original and copies with pt and family.     WL / [place], MDiv

## 2017-08-06 NOTE — Progress Notes (Addendum)
ANTICOAGULATION CONSULT NOTE -   Pharmacy Consult for Heparin Indication: hx DVT- bridge while off Xarelto  No Known Allergies  Patient Measurements: Height: 5\' 9"  (175.3 cm) Weight: 141 lb 15.6 oz (64.4 kg) IBW/kg (Calculated) : 70.7 Heparin Dosing Weight: 56.9 kg (act BW)  Vital Signs: Temp: 99.6 F (37.6 C) (08/10 1024) Temp Source: Oral (08/10 1024) BP: 123/88 (08/10 1024) Pulse Rate: 115 (08/10 1024)  Labs:  Recent Labs  08/04/17 0741 08/05/17 0430 08/05/17 1349 08/05/17 2343 08/06/17 0539 08/06/17 1208  HGB 9.3* 9.2*  --   --  9.7*  --   HCT 28.8* 27.9*  --   --  29.0*  --   PLT 104* 115*  --   --  111*  --   HEPARINUNFRC 0.40 0.25* 0.24* 0.39  --  0.43  CREATININE 0.81 0.75  --   --  0.72  --    Estimated Creatinine Clearance: 91.7 mL/min (by C-G formula based on SCr of 0.72 mg/dL).  Medical History: Past Medical History:  Diagnosis Date  . Acute deep vein thrombosis (DVT) of right lower extremity ([place]) 02/18/2017  . Malignant neoplasm of abdomen ([place])    [name] 01/11/2017  . Microcytic anemia    /notes 01/11/2017    Medications:  Infusions:  . dextrose 5 % and 0.9 % NaCl with KCl 20 mEq/L 65 mL/hr at 08/06/17 0908  . Marland KitchenTPN (CLINIMIX-E) Adult     And  . fat emulsion    . heparin 1,300 Units/hr (08/06/17 1203)  . methocarbamol (ROBAXIN)  IV Stopped (08/06/17 0615)  . Marland KitchenTPN (CLINIMIX-E) Adult 75 mL/hr at 08/05/17 1734   Assessment: 56 y.o M on Xarelto for hx DVT was admitted with bowel obstruction secondary to metastatic colon cancer. Orders were received to switch anticoagulation to IV heparin during hospitalization in anticipation of surgery or other invasive procedures.  Significant events:  8/7: Sigmoidoscopy- Malignant completely obstructing tumor in the descending colon. 8/8: diverting colostomy -- heparin drip turned off at 0730 for procedure  08/04/17 -Heparin resumed ~6 hours postoperatively  08/06/2017  Heparin level therapeutic on 1300  units/hr infusion Hgb low but stable Pltc low, slightly improved.  Note that pltc had recovered after the 7/10 chemotherapy and was WNL on admission, then began falling after heparin started.  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   Discussed with Dr. Cruzita Lederer - at present he believes HIT is unlikely so not checking HIT panel, but will continue to follow platelet count and reassess as needed.   Goal of Therapy:  Heparin level 0.3 - 0.7 Monitor platelets by anticoagulation protocol: Yes   Plan: Continue heparin drip at 1300 units/hr Daily HL and CBC while on heparin infusion.  Clayburn Pert, PharmD, BCPS Pager: [phone] 08/06/2017  12:51 PM

## 2017-08-06 NOTE — Progress Notes (Signed)
PROGRESS NOTE  Jeff [name] FVC:[id number] [name]: 1961/07/28 [name]: 07/29/2017 PCP: [name], MD   LOS: 8 days   Brief Narrative / Interim history: 56 y.o.malewith medical history significant for metastatic colon cancer on chemotherapy, pancreatic abscess with percutaneous drain, iron deficiency anemia, and history of right leg DVT on Xarelto, now presenting to the emergency department for evaluation of 3 days of abdominal pain with nausea, vomiting, and anorexia. Patient reports that he was in his usual state until approximately 3 days ago when he noted the fairly sudden onset of pain in the mid abdomen with nausea. He went on to develop nonbloody nonbilious vomiting, had a couple loose stools, and has had loss of appetite. CT of the abdomen and pelvis at the time of admission revealed a bowel obstruction at the level of the descending colon at the location of the percutaneous drainage catheter. There was wall thickening secondary to colonic neoplasm. There was dilated fluid-filled appendix, similar to the dilated loops of colon and small bowel, compatible with dilatation due to the bowel obstruction.  Mild decrease in size of the multiloculated fluid collection in the splenic hilum and distal tail of the pancreas. GI, General Surgery and Med/Onc were consulted to assist in management. On 08/03/2017, there was an unsuccessful attempt to endoscopically stent colon.  Now surgical intervention is planned.  The patient was recently discharged after admission from 07/09/2017 through 07/14/2017 during which the patient was treated for sepsis secondary to pancreatic Abscess. The Patient Had a Percutaneous Drain Placed by IR on 07/11/2017, and He Was Subsequently [facility] with Amox/Clav. IR tube changed on 07/19/17.  Assessment & Plan: Principal Problem:   Bowel obstruction (HCC) Active Problems:   Primary colon cancer with metastasis to other site ([place])   Anemia, iron deficiency   History of deep  vein thrombosis (DVT) of lower extremity   Intra-abdominal abscess    Pancreatic abscess   Malignant neoplasm of splenic flexure (HCC)   Malnutrition of moderate degree   Severe malnutrition (HCC)   Colon obstruction (HCC)   Colonic obstruction -likely related in the setting of colon cancer, his obstruction is at the level of splenic flexure.  -Gastroenterology was consulted, and [name] attempted a stent placement on 8/7 which was unsuccessful. -Given this, general surgery took patient to the operating room on 8/8 and patient underwent a transverse loop colostomy, bypassing the metastatic left colon cancer altogether -Postop recovery per general surgery, starting to recover  Metastatic colon adenocarcinoma -diagnosed 01/12/17, on palliative chemotherapy FOLFOX and panitumumab, last cycle on 07/06/2017.  He is seeing [name], consulted.  He has metastatic disease to the pancreas, spleen as well as peritoneal metastasis.  Overall very poor prognosis, further treatment as an outpatient  Pancreatic abscess -perc drain changed 07/19/17--management per surgery/IR, will likely need to be followed up in drain clinic as an outpatient.  Appreciate interventional radiology.  Continue Zosyn.  Cultures from 07/11/17 showed Bacteroides Ovatus  DVT in right lower extremity -diagnosed 02/17/17, on home rivaroxaban, now on heparin infusion periprocedural.  Resume Xarelto per surgery when OK to take in po  Anemia and pancytopenia secondary to chemotherapy -Monitor CBC, currently hemoglobin 9.3 >> 9.2>>9.7, stable over the last few days  Severe malnutrition -appreciate nutrition eval, start supplements when able to tolerate po, for now continue TPN  Hypokalemia -replete, -check mag--1.6 today. Replete  Goals of Care -remains full code   DVT prophylaxis: Heparin infusion Code Status: Full code Family Communication: No family at bedside  Disposition Plan: Home versus SNF when ready, discharge  readiness per general surgery postop  Consultants:   General surgery  Gastroenterology  Interventional radiology  Procedures:   None   Antimicrobials:  Zosyn 8/2 >>   Subjective: -no complaints. Feels weak. No chest pain, nausea/vomiting  Objective: Vitals:   08/06/17 0152 08/06/17 0500 08/06/17 0530 08/06/17 1024  BP: (!) 136/103  134/82 123/88  Pulse: (!) 112  (!) 105 (!) 115  Resp: 16  16 16   Temp: 99 F (37.2 C)  100.1 F (37.8 C) 99.6 F (37.6 C)  TempSrc: Oral  Oral Oral  SpO2: 99%  98% 98%  Weight:  64.4 kg (141 lb 15.6 oz)    Height:        Intake/Output Summary (Last 24 hours) at 08/06/17 1114 Last data filed at 08/06/17 0930  Gross per 24 hour  Intake          2160.17 ml  Output             3621 ml  Net         -1460.83 ml   Filed Weights   08/02/17 0533 08/03/17 0500 08/06/17 0500  Weight: 55.6 kg (122 lb 9.2 oz) 59.2 kg (130 lb 8.2 oz) 64.4 kg (141 lb 15.6 oz)    Examination:  Vitals:   08/06/17 0152 08/06/17 0500 08/06/17 0530 08/06/17 1024  BP: (!) 136/103  134/82 123/88  Pulse: (!) 112  (!) 105 (!) 115  Resp: 16  16 16   Temp: 99 F (37.2 C)  100.1 F (37.8 C) 99.6 F (37.6 C)  TempSrc: Oral  Oral Oral  SpO2: 99%  98% 98%  Weight:  64.4 kg (141 lb 15.6 oz)    Height:        Constitutional: NAD Respiratory: shallow breathing, clear Cardiovascular: RRR Abdomen: ostomy bag in place with dark material   Data Reviewed: I have independently reviewed following labs and imaging studies   CBC:  Recent Labs Lab 08/02/17 0740 08/03/17 0405 08/04/17 0741 08/05/17 0430 08/06/17 0539  WBC 6.2 6.2 8.1 11.1* 11.5*  NEUTROABS  --   --  5.5  --   --   HGB 9.5* 9.0* 9.3* 9.2* 9.7*  HCT 29.4* 28.1* 28.8* 27.9* 29.0*  MCV 87.0 87.5 85.0 83.8 84.5  PLT 99* 100* 104* 115* 638*   Basic Metabolic Panel:  Recent Labs Lab 08/02/17 0740 08/03/17 0405 08/04/17 0741 08/05/17 0430 08/06/17 0539  NA 143 144 140 135 129*  K 3.7 3.8 3.6  4.3 4.1  CL 110 112* 109 104 100*  CO2 27 26 25 25 22   GLUCOSE 122* 118* 127* 144* 124*  BUN <5* <5* 5* 12 14  CREATININE 0.76 0.78 0.81 0.75 0.72  CALCIUM 9.0 8.9 8.8* 8.4* 8.4*  MG 1.9 1.6* 1.7 1.5* 1.6*  PHOS  --   --  4.0 3.6 3.3   GFR: Estimated Creatinine Clearance: 91.7 mL/min (by C-G formula based on SCr of 0.72 mg/dL). Liver Function Tests:  Recent Labs Lab 08/04/17 0741 08/05/17 0430 08/06/17 0539  AST 15 18 20   ALT 6* 7* 7*  ALKPHOS 40 36* 45  BILITOT 0.4 0.4 0.2*  PROT 7.4 6.3* 6.6  ALBUMIN 3.0* 2.6* 2.6*   No results for input(s): LIPASE, AMYLASE in the last 168 hours. No results for input(s): AMMONIA in the last 168 hours. Coagulation Profile: No results for input(s): INR, PROTIME in the last 168 hours. Cardiac Enzymes: No results for input(s): CKTOTAL,  CKMB, CKMBINDEX, TROPONINI in the last 168 hours. BNP (last 3 results) No results for input(s): PROBNP in the last 8760 hours. HbA1C: No results for input(s): HGBA1C in the last 72 hours. CBG:  Recent Labs Lab 08/05/17 0600 08/05/17 1210 08/05/17 1740 08/06/17 0002 08/06/17 0553  GLUCAP 126* 109* 118* 125* 137*   Lipid Profile:  Recent Labs  08/04/17 0752 08/05/17 0430  TRIG 445* 29   Thyroid Function Tests: No results for input(s): TSH, T4TOTAL, FREET4, T3FREE, THYROIDAB in the last 72 hours. Anemia Panel: No results for input(s): VITAMINB12, FOLATE, FERRITIN, TIBC, IRON, RETICCTPCT in the last 72 hours. Urine analysis:    Component Value Date/Time   COLORURINE YELLOW 07/10/2017 0045   APPEARANCEUR CLEAR 07/10/2017 0045   LABSPEC 1.009 07/10/2017 0045   PHURINE 5.0 07/10/2017 0045   GLUCOSEU NEGATIVE 07/10/2017 0045   HGBUR MODERATE (A) 07/10/2017 0045   BILIRUBINUR NEGATIVE 07/10/2017 0045   KETONESUR NEGATIVE 07/10/2017 0045   PROTEINUR NEGATIVE 07/10/2017 0045   NITRITE NEGATIVE 07/10/2017 0045   LEUKOCYTESUR NEGATIVE 07/10/2017 0045   Sepsis Labs: Invalid input(s):  PROCALCITONIN, LACTICIDVEN  No results found for this or any previous visit (from the past 240 hour(s)).    Radiology Studies: Dg Chest Port 1 View  Result Date: 08/06/2017 CLINICAL DATA:  Wheezing EXAM: PORTABLE CHEST 1 VIEW COMPARISON:  July 29, 2017 FINDINGS: Port-A-Cath tip is at the cavoatrial junction. Nasogastric tube tip and side-port in the stomach. A drain is present in the left mid abdomen laterally. No pneumothorax. There is slight right base atelectasis. Lungs elsewhere clear. Heart size and pulmonary vascularity are normal. No adenopathy. A safety pin overlies the left chest. IMPRESSION: Tube and catheter positions as described without evident pneumothorax. Slight right base atelectasis. No edema or consolidation. Stable cardiac silhouette. Electronically Signed   By: Lowella Grip III M.D.   On: 08/06/2017 10:30   Scheduled Meds: . insulin aspart  0-9 Units Subcutaneous Q6H  . sodium chloride flush  10-40 mL Intracatheter Q12H  . sodium phosphate  1 enema Rectal Once   Continuous Infusions: . dextrose 5 % and 0.9 % NaCl with KCl 20 mEq/L 65 mL/hr at 08/06/17 0908  . Marland KitchenTPN (CLINIMIX-E) Adult     And  . fat emulsion    . heparin 1,300 Units/hr (08/05/17 1614)  . methocarbamol (ROBAXIN)  IV Stopped (08/06/17 0615)  . Marland KitchenTPN (CLINIMIX-E) Adult 75 mL/hr at 08/05/17 1734    Marzetta Board, MD, PhD [facility] Pager [phone]  If 7PM-7AM, please contact night-coverage [email or web address] Password [email or web address] 08/06/2017, 11:14 AM

## 2017-08-06 NOTE — Progress Notes (Signed)
[facility] Progress Note  2 Days Post-Op  Subjective: CC:  Abdominal pain improved - denies any pain at all during my exam. Did not get OOB yesterday but plans to mobilize today. Denies fever, chills, nausea, vomiting. Daughter coming today to learn colostomy care.  Objective: Vital signs in last 24 hours: Temp:  [99 F (37.2 C)-100.1 F (37.8 C)] 100.1 F (37.8 C) (08/10 0530) Pulse Rate:  [102-112] 105 (08/10 0530) Resp:  [16] 16 (08/10 0530) BP: (134-150)/(82-103) 134/82 (08/10 0530) SpO2:  [98 %-99 %] 98 % (08/10 0530) Weight:  [64.4 kg (141 lb 15.6 oz)] 64.4 kg (141 lb 15.6 oz) (08/10 0500) Last BM Date: 08/05/17  Intake/Output from previous day: 08/09 0701 - 08/10 0700 In: 2420.2 [I.V.:2205.2; IV Piggyback:215] Out: 93 [Urine:1900; Emesis/NG output:850; Drains:15; Stool:600] Intake/Output this shift: Total I/O In: -  Out: 200 [Urine:200]  PE: Gen:  Alert, NAD, pleasant Pulm:  Normal effort Abd: Soft, appropriately tender, mild distention, bowel sounds present in all 4 quadrants, colostomy with liquid stool and some flatus in pouch             NGT 850 cc/24h, bilious with some sanguinous drainage   Pancreatic drain 15 cc/24h  Ostomy - 600 cc/24h, scant blood in pouch Skin: warm and dry, no rashes  Psych: A&Ox3   Lab Results:   Recent Labs  08/05/17 0430 08/06/17 0539  WBC 11.1* 11.5*  HGB 9.2* 9.7*  HCT 27.9* 29.0*  PLT 115* 111*   BMET  Recent Labs  08/05/17 0430 08/06/17 0539  NA 135 129*  K 4.3 4.1  CL 104 100*  CO2 25 22  GLUCOSE 144* 124*  BUN 12 14  CREATININE 0.75 0.72  CALCIUM 8.4* 8.4*   PT/INR No results for input(s): LABPROT, INR in the last 72 hours. CMP     Component Value Date/Time   NA 129 (L) 08/06/2017 0539   NA 135 (L) 07/26/2017 1014   K 4.1 08/06/2017 0539   K 3.3 (L) 07/26/2017 1014   CL 100 (L) 08/06/2017 0539   CO2 22 08/06/2017 0539   CO2 25 07/26/2017 1014   GLUCOSE 124 (H) 08/06/2017 0539   GLUCOSE 129 07/26/2017 1014   BUN 14 08/06/2017 0539   BUN 9.8 07/26/2017 1014   CREATININE 0.72 08/06/2017 0539   CREATININE 1.0 07/26/2017 1014   CALCIUM 8.4 (L) 08/06/2017 0539   CALCIUM 9.3 07/26/2017 1014   PROT 6.6 08/06/2017 0539   PROT 8.2 07/26/2017 1014   ALBUMIN 2.6 (L) 08/06/2017 0539   ALBUMIN 3.3 (L) 07/26/2017 1014   AST 20 08/06/2017 0539   AST 24 07/26/2017 1014   ALT 7 (L) 08/06/2017 0539   ALT 6 07/26/2017 1014   ALKPHOS 45 08/06/2017 0539   ALKPHOS 87 07/26/2017 1014   BILITOT 0.2 (L) 08/06/2017 0539   BILITOT 0.23 07/26/2017 1014   GFRNONAA >60 08/06/2017 0539   GFRAA >60 08/06/2017 0539   Lipase     Component Value Date/Time   LIPASE 14 07/29/2017 1122   Studies/Results: No results found.  Anti-infectives: Anti-infectives    Start     Dose/Rate Route Frequency Ordered Stop   08/04/17 2200  piperacillin-tazobactam (ZOSYN) IVPB 3.375 g     3.375 g 12.5 mL/hr over 240 Minutes Intravenous Every 8 hours 08/04/17 1706 08/05/17 2244   08/04/17 0951  cefoTEtan in Dextrose 5% (CEFOTAN) 2-2.08 GM-% IVPB    Comments:  Algis Liming   : cabinet override  08/04/17 0951 08/04/17 1102   08/04/17 0945  cefoTEtan in Dextrose 5% (CEFOTAN) IVPB 2 g     2 g Intravenous  Once 08/04/17 0820 08/04/17 1117   08/04/17 0800  cefoTEtan (CEFOTAN) 2 g in dextrose 5 % 50 mL IVPB  Status:  Discontinued     2 g 100 mL/hr over 30 Minutes Intravenous On call to O.R. 08/04/17 0746 08/04/17 0819   07/29/17 2359  piperacillin-tazobactam (ZOSYN) IVPB 3.375 g  Status:  Discontinued     3.375 g 12.5 mL/hr over 240 Minutes Intravenous Every 8 hours 07/29/17 1729 08/04/17 1706   07/29/17 1730  piperacillin-tazobactam (ZOSYN) IVPB 3.375 g     3.375 g 100 mL/hr over 30 Minutes Intravenous  Once 07/29/17 1728 07/29/17 1853     Assessment/Plan SBO 2/2 metastatic adenocarcinoma of colon- Readmit 07/29/17 S/P Flexible sigmoidoscopy with attempted stent placement 08/03/17 [name],  descending colon 100% occluded by tumor and unable to place stent. POD#2 S/P Transverse loop colostomy 08/04/17 Dr. [name] - afebrile, sinus tachycardia - Bowel function is returning - 600 cc stool documented/24h, more in pouch - NG output 850 cc/24h, bilious  - Daily wet-to-dry dressing changes. - Mobilize/OOB, IS/pulm toilet  - Greatly appreciate [place] assistance, plans to return today to educate pts daughter on colostomy care.  Adenocarcinoma of the colon w/ mets to spleen/pancreas - on Chemotherapy 07/08/17  abscess of the pancreatic tail-  S/p perc drain; MULTIPLE ORGANISMS PRESENT, NONE PREDOMINANT, BACTEROIDES OVATUS , BETA LACTAMASE NEGATIVE on 07/11/17; Pt noted to be incurable and not seen by Dr. Barry Dienes as previously planned. Sepsis   DVT RLE on Xarelto at home. Heparin drip in hospital Severe PCM- prealbumin 6.4 on 08/01/17 Depression Normocytic anemia - hgb stable 9.7 from 9.2   FEN: NPO/TNA  YQ:MGNOI/BBCWUG 7/13-7/14 - Zosyn 7/14-7/16 - Ceftriaxone/Flagyl IV 7/16- 7/17, Augmentin 7/18, Zosyn 8/2-08/05/17, periop cefotetan 8/8; WBC 11.5 QBV:QXIHWTU gtt for Hx DVT  Plan: mobilize, continue TNA and NGT, await further bowel function.  monitor H&H due to scant blood in ostomy/NGT. Suspect secondary to local trauma in the setting of heparin gtt.     LOS: 8 days    [facility] 08/06/2017, 8:39 AM Pager: [phone] Consults: [phone] Mon-Fri 7:00 am-4:30 pm Sat-Sun 7:00 am-11:30 am

## 2017-08-06 NOTE — Progress Notes (Signed)
Patient ID: Jeff [name], male   [name]: 1961/03/18, 56 y.o.   MRN: [id number]    Referring Physician(s): Dr. [name]  Supervising Physician: Sandi Mariscal  Patient Status: [facility] - In-pt  Chief Complaint: LUQ fluid collection  Subjective: Patient is now POD 2, s/p transverse loop colostomy for obstructing  Metastatic left colon cancer.  He has an NGT in place and has no new complaints currently  Allergies: Patient has no known allergies.  Medications: Prior to Admission medications   Medication Sig Start Date End Date Taking? Authorizing Provider  clindamycin (CLINDAGEL) 1 % gel Apply topically 2 (two) times daily. 04/12/17  Yes [name], MD  Clindamycin Phos-Benzoyl Perox gel Apply 1 application topically 2 (two) times daily. 04/14/17  Yes [name], MD  ferrous sulfate 325 (65 FE) MG tablet Take 1 tablet (325 mg total) by mouth 2 (two) times daily with a meal. 03/29/17  Yes Curcio, Roselie Awkward, NP  hydrocortisone 2.5 % cream Apply topically 2 (two) times daily. 05/25/17  Yes [name], MD  lidocaine-prilocaine (EMLA) cream Apply to affected area once 01/27/17  Yes [name], MD  ondansetron (ZOFRAN) 8 MG tablet Take 1 tablet (8 mg total) by mouth 2 (two) times daily as needed for refractory nausea / vomiting. Start on day 3 after chemotherapy. 05/25/17  Yes [name], MD  Oxycodone HCl 10 MG TABS Take 1 tablet (10 mg total) by mouth every 6 (six) hours as needed (pain). 07/26/17 08/25/17 Yes [name], MD  prochlorperazine (COMPAZINE) 10 MG tablet Take 1 tablet (10 mg total) by mouth every 6 (six) hours as needed (Nausea or vomiting). 05/25/17  Yes [name], MD  rivaroxaban (XARELTO) 20 MG TABS tablet Take 1 tablet (20 mg total) by mouth daily with supper. 05/25/17  Yes [name], MD  loperamide (IMODIUM A-D) 2 MG tablet Take 1 tablet (2 mg total) by mouth 4 (four) times daily as needed for diarrhea or loose stools. Patient not taking: Reported on 07/29/2017 03/02/17   [name], MD  mirtazapine  (REMERON) 15 MG tablet Take 1 tablet (15 mg total) by mouth at bedtime. Patient not taking: Reported on 07/29/2017 06/22/17   Gardenia Phlegm, NP    Vital Signs: BP 123/88 (BP Location: Right Arm)   Pulse (!) 115   Temp 99.6 F (37.6 C) (Oral)   Resp 16   Ht 5\' 9"  (1.753 m)   Wt 141 lb 15.6 oz (64.4 kg)   SpO2 98%   BMI 20.97 kg/m   Physical Exam: Abd: soft, LUQ/flank JP drain in place.  Site is c/d/i.  Minimal output.  About 15cc/day documented.  Imaging: Dg Chest Port 1 View  Result Date: 08/06/2017 CLINICAL DATA:  Wheezing EXAM: PORTABLE CHEST 1 VIEW COMPARISON:  July 29, 2017 FINDINGS: Port-A-Cath tip is at the cavoatrial junction. Nasogastric tube tip and side-port in the stomach. A drain is present in the left mid abdomen laterally. No pneumothorax. There is slight right base atelectasis. Lungs elsewhere clear. Heart size and pulmonary vascularity are normal. No adenopathy. A safety pin overlies the left chest. IMPRESSION: Tube and catheter positions as described without evident pneumothorax. Slight right base atelectasis. No edema or consolidation. Stable cardiac silhouette. Electronically Signed   By: Lowella Grip III M.D.   On: 08/06/2017 10:30   Dg Abd Portable 1v  Result Date: 08/03/2017 CLINICAL DATA:  NG tube placement. EXAM: PORTABLE ABDOMEN - 1 VIEW FINDINGS: NG tube with tip in the gastric antrum.  No dilated to large or small bowel. Percutaneous nephrostomy tube on the LEFT. IMPRESSION: NG tube with tip in the gastric antrum. Electronically Signed   By: Suzy Bouchard M.D.   On: 08/03/2017 14:29    Labs:  CBC:  Recent Labs  08/03/17 0405 08/04/17 0741 08/05/17 0430 08/06/17 0539  WBC 6.2 8.1 11.1* 11.5*  HGB 9.0* 9.3* 9.2* 9.7*  HCT 28.1* 28.8* 27.9* 29.0*  PLT 100* 104* 115* 111*    COAGS:  Recent Labs  01/11/17 1009 01/29/17 1256 07/11/17 0340 07/29/17 1954  INR 1.20 1.07 1.27  --   APTT 34 31  --  32    BMP:  Recent Labs   08/03/17 0405 08/04/17 0741 08/05/17 0430 08/06/17 0539  NA 144 140 135 129*  K 3.8 3.6 4.3 4.1  CL 112* 109 104 100*  CO2 26 25 25 22   GLUCOSE 118* 127* 144* 124*  BUN <5* 5* 12 14  CALCIUM 8.9 8.8* 8.4* 8.4*  CREATININE 0.78 0.81 0.75 0.72  GFRNONAA >60 >60 >60 >60  GFRAA >60 >60 >60 >60    LIVER FUNCTION TESTS:  Recent Labs  07/30/17 0525 08/04/17 0741 08/05/17 0430 08/06/17 0539  BILITOT 0.8 0.4 0.4 0.2*  AST 16 15 18 20   ALT 7* 6* 7* 7*  ALKPHOS 46 40 36* 45  PROT 7.2 7.4 6.3* 6.6  ALBUMIN 3.1* 3.0* 2.6* 2.6*    Assessment and Plan: 1. Metastatic left colon cancer with history of pancreatic tail abscess, s/p perc drain on 07/10/17  We will plan to inject his drain today or Monday pending scheduling. He is having minimal output.  We will inject it to rule out a pancreatic fistula, given the invasion of his colon cancer into his pancreas, stomach, etc.  Otherwise, cont drain for now.  Electronically Signed: Henreitta Cea 08/06/2017, 10:51 AM   I spent a total of 15 Minutes at the the patient's bedside AND on the patient's hospital floor or unit, greater than 50% of which was counseling/coordinating care for pancreatic tail abscess

## 2017-08-06 NOTE — Progress Notes (Signed)
ANTICOAGULATION CONSULT NOTE -   Pharmacy Consult for Heparin Indication: hx DVT- bridge while off Xarelto  No Known Allergies  Patient Measurements: Height: 5\' 9"  (175.3 cm) Weight: 130 lb 8.2 oz (59.2 kg) IBW/kg (Calculated) : 70.7 Heparin Dosing Weight: 56.9 kg (act BW)  Vital Signs: Temp: 99 F (37.2 C) (08/09 2117) Temp Source: Oral (08/09 2117) BP: 137/96 (08/09 2117) Pulse Rate: 104 (08/09 2117)  Labs:  Recent Labs  08/03/17 0405 08/04/17 0741 08/05/17 0430 08/05/17 1349 08/05/17 2343  HGB 9.0* 9.3* 9.2*  --   --   HCT 28.1* 28.8* 27.9*  --   --   PLT 100* 104* 115*  --   --   HEPARINUNFRC 0.53 0.40 0.25* 0.24* 0.39  CREATININE 0.78 0.81 0.75  --   --    Estimated Creatinine Clearance: 84.3 mL/min (by C-G formula based on SCr of 0.75 mg/dL).  Medical History: Past Medical History:  Diagnosis Date  . Acute deep vein thrombosis (DVT) of right lower extremity ([place]) 02/18/2017  . Malignant neoplasm of abdomen ([place])    [name] 01/11/2017  . Microcytic anemia    /notes 01/11/2017    Medications:  Infusions:  . dextrose 5 % and 0.9 % NaCl with KCl 20 mEq/L 50 mL/hr at 08/05/17 2252  . Marland KitchenTPN (CLINIMIX-E) Adult 75 mL/hr at 08/05/17 1734   And  . fat emulsion 240 mL (08/05/17 1735)  . heparin 1,300 Units/hr (08/05/17 1614)  . methocarbamol (ROBAXIN)  IV Stopped (08/05/17 2322)   Assessment: Patient's a 56 y.o M with hx DVT and colonic obstruction with this admission currently on heparin drip while xarelto is on hold.  Significant events:  8/7: Sigmoidoscopy- Malignant completely obstructing tumor in the descending colon. 8/8: diverting colostomy -- heparin drip turned off at 0730 for procedure  8/8 -Heparin resumed ~6 hours from surgery.  8/9 - 0430 HL=0.25 units/ml below goal, no infusion issues, some slight bleeding at surgery site. - 1349 HL=0.24 subtherapeutic on 1200 units/hr, no infusion issues or bleeding per RN -2343 HL=0.39 at goal, no infusion or  bleeding issues per RN  Goal of Therapy:  Heparin level 0.3-0.7 units/ml aPTT 66-102 seconds Monitor platelets by anticoagulation protocol: Yes   Plan: Continue heparin drip at 1300 units/hr Recheck confirmatory HL Daily HL and CBC while on heparin infusion.   Lawana Pai R 08/06/2017, 1:19 AM

## 2017-08-06 NOTE — Progress Notes (Signed)
Nutrition Follow-up  DOCUMENTATION CODES:   Severe malnutrition in context of chronic illness, Underweight  INTERVENTION:  - Continue TPN per Pharmacy.  NUTRITION DIAGNOSIS:   Malnutrition (severe) related to chronic illness, catabolic illness, cancer and cancer related treatments as evidenced by severe depletion of muscle mass, severe depletion of body fat, percent weight loss. -ongoing  GOAL:   Patient will meet greater than or equal to 90% of their needs -met with TPN regimen  MONITOR:   Weight trends, Labs, Skin, I & O's, Other (Comment) (TPN regimen)  ASSESSMENT:   56 y.o. 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 Patient reports that he was in his usual state until approximately 3 days ago when he noted the fairly sudden onset of pain in the mid abdomen with nausea. He went on to develop nonbloody nonbilious vomiting, had a couple loose stools, and has had loss of appetite.  8/10 Pt has been NPO since admission (8 days). He is receiving TPN at goal rate via port: Clinimix E 5/20 @ 75 mL/hr with 20% ILE @ 20 mL/hr x12 hours. This regimen provides 90 grams of protein and 2064 kcal. Weight +5.2 kg/11 lb from 8/7-8/10; will monitor weight trends closely.   POD #3 flex sig with attempted stent placement but findings of 100% occlusion to descending colon d/t tumor and stent was ultimately unable to be placed. POD #2 transverse loop colostomy and Surgery note from this AM states bowel function returning. NGT to suction with 850cc out over the past ~24 hours.   Medications reviewed; sliding scale Novolog, 2 g IV Mg sulfate x1 run today. Labs reviewed; CBGs: 125 and 137 mg/dL this AM, Na: 129 mmol/L, Cl: 100 mmol/L, Ca:  8.4 mg/dL, Mg: 1.6 mg/dL.  IVF: D5-NS-20 mEq KCl @ 65 mL/hr x72 hours starting 8/9 @ 1800; provides 265 kcal from dextrose/day.    8/7 - Patient has been NPO since admission 8/2 (5 days). - Per initial assessment he did not keep any food down for a few days prior to 8/2.  - Patient has had poor PO intake for ~ 1 week now.  - Would monitor for refeeding syndrome. - NGT in place, output: 525 ml x 24 hrs.  - Pt scheduled for stent placement this afternoon.  - Will monitor for plan following procedure.   8/3 - Hx outlined above from H&P.  - Pt reports that d/t N/V, abdominal pain he was unable to keep anything down for the few days PTA.  - Prior to that time he had been experiencing decrease in appetite but is unsure when this began.  - NGT in place.  - Physical assessment shows moderate and severe muscle and moderate and severe fat wasting.  - Per chart review, pt has lost 6 lbs (4.5% body weight) in 20 days; this is significant for time frame.  Per Surgery PA note today: CT scan suggesting obstruction is in the proximal descending colon. Initiated SBO protocol with administration of gastrografin to see if pt is fully obstructed, 8hr delay film pending.  IVF: D5-NS-20 mEq KCl @ 75 mL/hr (206 kcal from dextrose)   Diet Order:  Diet NPO time specified Except for: Ice Chips TPN (CLINIMIX-E) Adult TPN (CLINIMIX-E) Adult  Skin:  Wound (see comment) (Abdominal incision  from 8/8)  Last BM:  8/9  Height:   Ht Readings from Last 1 Encounters:  07/31/17 5' 9" (1.753 m)    Weight:   Wt Readings from Last 1 Encounters:  08/06/17 141 lb 15.6 oz (64.4 kg)    Ideal Body Weight:  72.73 kg  BMI:  Body mass index is 20.97 kg/m.  Estimated Nutritional Needs:   Kcal:  1880-2050 (33-36 kcal/kg)  Protein:  85-97 grams (1.5-1.7 grams/kg)  Fluid:  >/= 2 L/day  EDUCATION NEEDS:   No education needs identified at this time    Jarome Matin, MS, RD, LDN, CNSC Inpatient  Clinical Dietitian Pager # [phone] After hours/weekend pager # [phone]

## 2017-08-07 LAB — CBC
HEMATOCRIT: 25.5 % — AB (ref 39.0–52.0)
HEMOGLOBIN: 8.6 g/dL — AB (ref 13.0–17.0)
MCH: 27.9 pg (ref 26.0–34.0)
MCHC: 33.7 g/dL (ref 30.0–36.0)
MCV: 82.8 fL (ref 78.0–100.0)
Platelets: 102 10*3/uL — ABNORMAL LOW (ref 150–400)
RBC: 3.08 MIL/uL — AB (ref 4.22–5.81)
RDW: 15 % (ref 11.5–15.5)
WBC: 9.2 10*3/uL (ref 4.0–10.5)

## 2017-08-07 LAB — BASIC METABOLIC PANEL
ANION GAP: 9 (ref 5–15)
BUN: 14 mg/dL (ref 6–20)
CHLORIDE: 98 mmol/L — AB (ref 101–111)
CO2: 23 mmol/L (ref 22–32)
CREATININE: 0.6 mg/dL — AB (ref 0.61–1.24)
Calcium: 8.3 mg/dL — ABNORMAL LOW (ref 8.9–10.3)
GFR calc Af Amer: >60 mL/min (ref >60)
GFR calc non Af Amer: >60 mL/min (ref >60)
Glucose, Bld: 123 mg/dL — ABNORMAL HIGH (ref 65–99)
Potassium: 4.3 mmol/L (ref 3.5–5.1)
SODIUM: 130 mmol/L — AB (ref 135–145)

## 2017-08-07 LAB — PHOSPHORUS: PHOSPHORUS: 3.7 mg/dL (ref 2.5–4.6)

## 2017-08-07 LAB — HEPARIN LEVEL (UNFRACTIONATED): Heparin Unfractionated: 0.33 IU/mL (ref 0.30–0.70)

## 2017-08-07 LAB — GLUCOSE, CAPILLARY: GLUCOSE-CAPILLARY: 124 mg/dL — AB (ref 65–99)

## 2017-08-07 LAB — MAGNESIUM: MAGNESIUM: 1.6 mg/dL — AB (ref 1.7–2.4)

## 2017-08-07 MED ORDER — MAGNESIUM SULFATE 4 GM/100ML IV SOLN
4.0000 g | Freq: Once | INTRAVENOUS | Status: AC
  Administered 2017-08-07: 4 g via INTRAVENOUS
  Filled 2017-08-07: qty 100

## 2017-08-07 MED ORDER — FAT EMULSION 20 % IV EMUL
240.0000 mL | INTRAVENOUS | Status: AC
  Administered 2017-08-07: 240 mL via INTRAVENOUS
  Filled 2017-08-07: qty 250

## 2017-08-07 MED ORDER — TRACE MINERALS CR-CU-MN-SE-ZN 10-1000-500-60 MCG/ML IV SOLN
INTRAVENOUS | Status: AC
  Administered 2017-08-07: 18:00:00 via INTRAVENOUS
  Filled 2017-08-07: qty 1800

## 2017-08-07 NOTE — Progress Notes (Signed)
Stephen NOTE   Pharmacy Consult for TPN Indication: intolerance to enteral feeds  Patient Measurements: Height: 5\' 9"  (175.3 cm) Weight: 141 lb 15.6 oz (64.4 kg) IBW/kg (Calculated) : 70.7 TPN AdjBW (KG): 55.3 Body mass index is 20.97 kg/m.   Insulin Requirements: 2 units Novolog SSI in past 24 hrs - DC SSI/CBGs 8/11  Current Nutrition: NPO  IVF: D5NS + KCl 20 mEq/L running at 65 mL/hr  Central access: PICC TPN start date: 8/7  ASSESSMENT                                                                                                          HPI:  56 y.o.malewith medical hx significant for metastatic colon cancer, on chemo, pancreatic abscess with percutaneous drain, iron deficiency anemia, and hx of right leg DVT, on Xarelto.  Presented to the ED for evaluation of 3 days of abdominal pain with nausea, vomiting, and anorexia. CT of the abdomen and pelvis at the time of admission revealed a bowel obstruction at the level of the descending colon at the location of the percutaneous drainage catheter.  Significant events:  8/7: Sigmoidoscopy- Malignant completely obstructing tumor in the                                    descending colon. 8/8: Transverse Loop Colostomy   Recent Labs  08/04/17 0752 08/05/17 0430 08/06/17 0539 08/07/17 0550  NA  --  135 129* 130*  K  --  4.3 4.1 4.3  CL  --  104 100* 98*  CO2  --  25 22 23   GLUCOSE  --  144* 124* 123*  BUN  --  12 14 14   CREATININE  --  0.75 0.72 0.60*  CALCIUM  --  8.4* 8.4* 8.3*  PHOS  --  3.6 3.3 3.7  MG  --  1.5* 1.6* 1.6*  ALBUMIN  --  2.6* 2.6*  --   ALKPHOS  --  36* 45  --   AST  --  18 20  --   ALT  --  7* 7*  --   BILITOT  --  0.4 0.2*  --   TRIG 445* 29  --   --   PREALBUMIN 9.8*  --   --   --   Corr Ca 9.5   Today:   Glucose (goal <150): no hx DM; cbgs at goal - will DC SSI and CBGs  Electrolytes:  Mg remains 1.6 afer 2 grams IV x 1 yesterday; Na low  at 130   Renal: SCr WNL, stable  LFTs: below ULN  TGs: 445 (8/8), 29 (8/9)  Prealbumin - 6.4 (8/4), 9.8 (8/8)  I/O (-) 4785 ml ;  NG output 2450 mL/24 hr, colostomy 254 ml   NUTRITIONAL GOALS  RD recs (8/10): Kcal:  1880-2050 (33-36 kcal/kg) Protein:  85-97 grams (1.5-1.7 grams/kg)  Clinimix E 5/20 at a goal rate of 44ml/hr + 20% fat emulsion at 38ml/hr x 12 hours  to provide: 90 g/day protein, 2064 Kcal/day.  PLAN                                                                                                                         This AM:  Magnesium sulfate 4 gm IV x1  At 1800 today:  Continue Clinimix E 5/20 at goal rate of 75 mL/hr  Continue 20% fat emulsion at 24ml/hr x 12 hours daily  TPN to contain standard multivitamins and trace elements.  DC SSI and CBGs  Given NG output and hyponatremia, continue mIVF at 65 mL/hr  TPN lab panels on Mondays & Thursdays.  BMet and Mg tomorrow AM.  Eudelia Bunch, Pharm.D. [phone] 08/07/2017 7:38 AM

## 2017-08-07 NOTE — Progress Notes (Signed)
Referring Physician(s): Dr Wardell Heath Dr Ok Anis  Supervising Physician: Marybelle Killings  Patient Status:  [facility] - In-pt  Chief Complaint:  Pancreatic tail abscess Drain placed 07/11/17  Subjective:  Output 20 cc yesterday None collected today: minimal in JP- Blood tinged fluid Pt has no complaints Up in bed Family at bedside Injection not performed 8/10----Radiology scheduled unable to accommodate ---will plan for injection 8/13 Pt and family aware  Allergies: Patient has no known allergies.  Medications: Prior to Admission medications   Medication Sig Start Date End Date Taking? Authorizing Provider  clindamycin (CLINDAGEL) 1 % gel Apply topically 2 (two) times daily. 04/12/17  Yes [name], MD  Clindamycin Phos-Benzoyl Perox gel Apply 1 application topically 2 (two) times daily. 04/14/17  Yes [name], MD  ferrous sulfate 325 (65 FE) MG tablet Take 1 tablet (325 mg total) by mouth 2 (two) times daily with a meal. 03/29/17  Yes Curcio, Roselie Awkward, NP  hydrocortisone 2.5 % cream Apply topically 2 (two) times daily. 05/25/17  Yes [name], MD  lidocaine-prilocaine (EMLA) cream Apply to affected area once 01/27/17  Yes [name], MD  ondansetron (ZOFRAN) 8 MG tablet Take 1 tablet (8 mg total) by mouth 2 (two) times daily as needed for refractory nausea / vomiting. Start on day 3 after chemotherapy. 05/25/17  Yes [name], MD  Oxycodone HCl 10 MG TABS Take 1 tablet (10 mg total) by mouth every 6 (six) hours as needed (pain). 07/26/17 08/25/17 Yes [name], MD  prochlorperazine (COMPAZINE) 10 MG tablet Take 1 tablet (10 mg total) by mouth every 6 (six) hours as needed (Nausea or vomiting). 05/25/17  Yes [name], MD  rivaroxaban (XARELTO) 20 MG TABS tablet Take 1 tablet (20 mg total) by mouth daily with supper. 05/25/17  Yes [name], MD  loperamide (IMODIUM A-D) 2 MG tablet Take 1 tablet (2 mg total) by mouth 4 (four) times daily as needed for diarrhea or loose stools. Patient not  taking: Reported on 07/29/2017 03/02/17   [name], MD  mirtazapine (REMERON) 15 MG tablet Take 1 tablet (15 mg total) by mouth at bedtime. Patient not taking: Reported on 07/29/2017 06/22/17   Gardenia Phlegm, NP     Vital Signs: BP 118/80 (BP Location: Right Arm)   Pulse (!) 114   Temp 99 F (37.2 C) (Oral)   Resp 14   Ht 5\' 9"  (1.753 m)   Wt 141 lb 15.6 oz (64.4 kg)   SpO2 98%   BMI 20.97 kg/m   Physical Exam  Abdominal: Soft.  Musculoskeletal: Normal range of motion.  Neurological: He is alert.  Site of drain is clean and dry NT No bleeding 20 cc yesterday; none recorded today Tubing kinked and JP bulb under pt Confirms flushings by RN 3x/day    Skin: Skin is warm and dry.  Psychiatric: He has a normal mood and affect. His behavior is normal.  Nursing note and vitals reviewed.   Imaging: Dg Chest Port 1 View  Result Date: 08/06/2017 CLINICAL DATA:  Wheezing EXAM: PORTABLE CHEST 1 VIEW COMPARISON:  July 29, 2017 FINDINGS: Port-A-Cath tip is at the cavoatrial junction. Nasogastric tube tip and side-port in the stomach. A drain is present in the left mid abdomen laterally. No pneumothorax. There is slight right base atelectasis. Lungs elsewhere clear. Heart size and pulmonary vascularity are normal. No adenopathy. A safety pin overlies the left chest. IMPRESSION: Tube and catheter positions as described without evident  pneumothorax. Slight right base atelectasis. No edema or consolidation. Stable cardiac silhouette. Electronically Signed   By: Lowella Grip III M.D.   On: 08/06/2017 10:30   Dg Abd Portable 1v  Result Date: 08/03/2017 CLINICAL DATA:  NG tube placement. EXAM: PORTABLE ABDOMEN - 1 VIEW FINDINGS: NG tube with tip in the gastric antrum. No dilated to large or small bowel. Percutaneous nephrostomy tube on the LEFT. IMPRESSION: NG tube with tip in the gastric antrum. Electronically Signed   By: Suzy Bouchard M.D.   On: 08/03/2017 14:29     Labs:  CBC:  Recent Labs  08/04/17 0741 08/05/17 0430 08/06/17 0539 08/07/17 0550  WBC 8.1 11.1* 11.5* 9.2  HGB 9.3* 9.2* 9.7* 8.6*  HCT 28.8* 27.9* 29.0* 25.5*  PLT 104* 115* 111* 102*    COAGS:  Recent Labs  01/11/17 1009 01/29/17 1256 07/11/17 0340 07/29/17 1954  INR 1.20 1.07 1.27  --   APTT 34 31  --  32    BMP:  Recent Labs  08/04/17 0741 08/05/17 0430 08/06/17 0539 08/07/17 0550  NA 140 135 129* 130*  K 3.6 4.3 4.1 4.3  CL 109 104 100* 98*  CO2 25 25 22 23   GLUCOSE 127* 144* 124* 123*  BUN 5* 12 14 14   CALCIUM 8.8* 8.4* 8.4* 8.3*  CREATININE 0.81 0.75 0.72 0.60*  GFRNONAA >60 >60 >60 >60  GFRAA >60 >60 >60 >60    LIVER FUNCTION TESTS:  Recent Labs  07/30/17 0525 08/04/17 0741 08/05/17 0430 08/06/17 0539  BILITOT 0.8 0.4 0.4 0.2*  AST 16 15 18 20   ALT 7* 6* 7* 7*  ALKPHOS 46 40 36* 45  PROT 7.2 7.4 6.3* 6.6  ALBUMIN 3.1* 3.0* 2.6* 2.6*    Assessment and Plan:  Pancreatic tail collection drain intact Will need RN to check positioning of JP and tubing to be sure proper and able to work efficiently Will follow Plan for drain injection 8/13 in IR  Electronically Signed: Shannon Balthazar A, PA-C 08/07/2017, 10:27 AM   I spent a total of 15 Minutes at the the patient's bedside AND on the patient's hospital floor or unit, greater than 50% of which was counseling/coordinating care for panc tail collection drain

## 2017-08-07 NOTE — Progress Notes (Signed)
PROGRESS NOTE  Jeff [name] TFT:[id number] [name]: 1961/08/30 [name]: 07/29/2017 PCP: [name], MD   LOS: 9 days   Brief Narrative / Interim history: 56 y.o.malewith medical history significant for metastatic colon cancer on chemotherapy, pancreatic abscess with percutaneous drain, iron deficiency anemia, and history of right leg DVT on Xarelto, now presenting to the emergency department for evaluation of 3 days of abdominal pain with nausea, vomiting, and anorexia. Patient reports that he was in his usual state until approximately 3 days ago when he noted the fairly sudden onset of pain in the mid abdomen with nausea. He went on to develop nonbloody nonbilious vomiting, had a couple loose stools, and has had loss of appetite. CT of the abdomen and pelvis at the time of admission revealed a bowel obstruction at the level of the descending colon at the location of the percutaneous drainage catheter. There was wall thickening secondary to colonic neoplasm. There was dilated fluid-filled appendix, similar to the dilated loops of colon and small bowel, compatible with dilatation due to the bowel obstruction.  Mild decrease in size of the multiloculated fluid collection in the splenic hilum and distal tail of the pancreas. GI, General Surgery and Med/Onc were consulted to assist in management. On 08/03/2017, there was an unsuccessful attempt to endoscopically stent colon.  Now surgical intervention is planned.  The patient was recently discharged after admission from 07/09/2017 through 07/14/2017 during which the patient was treated for sepsis secondary to pancreatic Abscess. The Patient Had a Percutaneous Drain Placed by IR on 07/11/2017, and He Was Subsequently [name] with Amox/Clav. IR tube changed on 07/19/17.  Assessment & Plan: Principal Problem:   Bowel obstruction (HCC) Active Problems:   Primary colon cancer with metastasis to other site ([place])   Anemia, iron deficiency   History of deep  vein thrombosis (DVT) of lower extremity   Intra-abdominal abscess    Pancreatic abscess   Malignant neoplasm of splenic flexure (HCC)   Malnutrition of moderate degree   Severe malnutrition (HCC)   Colon obstruction (HCC)   Colonic obstruction -likely related in the setting of colon cancer, his obstruction is at the level of splenic flexure.  -Gastroenterology was consulted, and [name] attempted a stent placement on 8/7 which was unsuccessful. -Given this, general surgery took patient to the operating room on 8/8 and patient underwent a transverse loop colostomy, bypassing the metastatic left colon cancer altogether -Postop recovery per general surgery  Metastatic colon adenocarcinoma -diagnosed 01/12/17, on palliative chemotherapy FOLFOX and panitumumab, last cycle on 07/06/2017.  He is seeing [name], consulted.  He has metastatic disease to the pancreas, spleen as well as peritoneal metastasis.  Overall very poor prognosis, further treatment as an outpatient  Pancreatic abscess -perc drain changed 07/19/17--management per surgery/IR, will likely need to be followed up in drain clinic as an outpatient.  Appreciate interventional radiology.  Continue Zosyn.  Cultures from 07/11/17 showed Bacteroides Ovatus.  The drain to be evaluated by radiology on 8/13  DVT in right lower extremity -diagnosed 02/17/17, on home rivaroxaban, now on heparin infusion periprocedural.  Resume Xarelto per surgery when OK to take in po  Anemia and pancytopenia secondary to chemotherapy -Monitor CBC, currently hemoglobin 9.3 >> 9.2>>9.7, stable over the last few days  Severe malnutrition -appreciate nutrition eval, start supplements when able to tolerate po, for now continue TPN  Hypokalemia -replete, -check mag--1.6 today. Replete  Goals of Care -remains full code   DVT prophylaxis: Heparin infusion Code Status: Full  code Family Communication: No family at bedside Disposition Plan: Home versus SNF  when ready, discharge readiness per general surgery postop  Consultants:   General surgery  Gastroenterology  Interventional radiology  Procedures:   None   Antimicrobials:  Zosyn 8/2 >>   Subjective: - no complaints today  Objective: Vitals:   08/06/17 1834 08/06/17 2219 08/07/17 0209 08/07/17 0540  BP: 132/85 122/80 125/80 118/80  Pulse: (!) 116 (!) 118 (!) 115 (!) 114  Resp: 16 14 14 14   Temp: 99.2 F (37.3 C) 98.7 F (37.1 C) 99.3 F (37.4 C) 99 F (37.2 C)  TempSrc: Axillary Oral Oral Oral  SpO2: 99% 99% 96% 98%  Weight:      Height:        Intake/Output Summary (Last 24 hours) at 08/07/17 1313 Last data filed at 08/07/17 1100  Gross per 24 hour  Intake               10 ml  Output             4089 ml  Net            -4079 ml   Filed Weights   08/02/17 0533 08/03/17 0500 08/06/17 0500  Weight: 55.6 kg (122 lb 9.2 oz) 59.2 kg (130 lb 8.2 oz) 64.4 kg (141 lb 15.6 oz)    Examination:  Vitals:   08/06/17 1834 08/06/17 2219 08/07/17 0209 08/07/17 0540  BP: 132/85 122/80 125/80 118/80  Pulse: (!) 116 (!) 118 (!) 115 (!) 114  Resp: 16 14 14 14   Temp: 99.2 F (37.3 C) 98.7 F (37.1 C) 99.3 F (37.4 C) 99 F (37.2 C)  TempSrc: Axillary Oral Oral Oral  SpO2: 99% 99% 96% 98%  Weight:      Height:        Constitutional: NAD Respiratory: CTA Cardiovascular: RRR Abdomen: ostomy bag in place with dark material and scant bleeding today   Data Reviewed: I have independently reviewed following labs and imaging studies   CBC:  Recent Labs Lab 08/03/17 0405 08/04/17 0741 08/05/17 0430 08/06/17 0539 08/07/17 0550  WBC 6.2 8.1 11.1* 11.5* 9.2  NEUTROABS  --  5.5  --   --   --   HGB 9.0* 9.3* 9.2* 9.7* 8.6*  HCT 28.1* 28.8* 27.9* 29.0* 25.5*  MCV 87.5 85.0 83.8 84.5 82.8  PLT 100* 104* 115* 111* 301*   Basic Metabolic Panel:  Recent Labs Lab 08/03/17 0405 08/04/17 0741 08/05/17 0430 08/06/17 0539 08/07/17 0550  NA 144 140 135 129* 130*   K 3.8 3.6 4.3 4.1 4.3  CL 112* 109 104 100* 98*  CO2 26 25 25 22 23   GLUCOSE 118* 127* 144* 124* 123*  BUN <5* 5* 12 14 14   CREATININE 0.78 0.81 0.75 0.72 0.60*  CALCIUM 8.9 8.8* 8.4* 8.4* 8.3*  MG 1.6* 1.7 1.5* 1.6* 1.6*  PHOS  --  4.0 3.6 3.3 3.7   GFR: Estimated Creatinine Clearance: 91.7 mL/min (A) (by C-G formula based on SCr of 0.6 mg/dL (L)). Liver Function Tests:  Recent Labs Lab 08/04/17 0741 08/05/17 0430 08/06/17 0539  AST 15 18 20   ALT 6* 7* 7*  ALKPHOS 40 36* 45  BILITOT 0.4 0.4 0.2*  PROT 7.4 6.3* 6.6  ALBUMIN 3.0* 2.6* 2.6*   No results for input(s): LIPASE, AMYLASE in the last 168 hours. No results for input(s): AMMONIA in the last 168 hours. Coagulation Profile: No results for input(s): INR, PROTIME in the last  168 hours. Cardiac Enzymes: No results for input(s): CKTOTAL, CKMB, CKMBINDEX, TROPONINI in the last 168 hours. BNP (last 3 results) No results for input(s): PROBNP in the last 8760 hours. HbA1C: No results for input(s): HGBA1C in the last 72 hours. CBG:  Recent Labs Lab 08/06/17 0553 08/06/17 1312 08/06/17 1802 08/06/17 2331 08/07/17 0535  GLUCAP 137* 117* 107* 129* 124*   Lipid Profile:  Recent Labs  08/05/17 0430  TRIG 29   Thyroid Function Tests: No results for input(s): TSH, T4TOTAL, FREET4, T3FREE, THYROIDAB in the last 72 hours. Anemia Panel: No results for input(s): VITAMINB12, FOLATE, FERRITIN, TIBC, IRON, RETICCTPCT in the last 72 hours. Urine analysis:    Component Value Date/Time   COLORURINE YELLOW 07/10/2017 0045   APPEARANCEUR CLEAR 07/10/2017 0045   LABSPEC 1.009 07/10/2017 0045   PHURINE 5.0 07/10/2017 0045   GLUCOSEU NEGATIVE 07/10/2017 0045   HGBUR MODERATE (A) 07/10/2017 0045   BILIRUBINUR NEGATIVE 07/10/2017 0045   KETONESUR NEGATIVE 07/10/2017 0045   PROTEINUR NEGATIVE 07/10/2017 0045   NITRITE NEGATIVE 07/10/2017 0045   LEUKOCYTESUR NEGATIVE 07/10/2017 0045   Sepsis Labs: Invalid input(s):  PROCALCITONIN, LACTICIDVEN  No results found for this or any previous visit (from the past 240 hour(s)).    Radiology Studies: Dg Chest Port 1 View  Result Date: 08/06/2017 CLINICAL DATA:  Wheezing EXAM: PORTABLE CHEST 1 VIEW COMPARISON:  July 29, 2017 FINDINGS: Port-A-Cath tip is at the cavoatrial junction. Nasogastric tube tip and side-port in the stomach. A drain is present in the left mid abdomen laterally. No pneumothorax. There is slight right base atelectasis. Lungs elsewhere clear. Heart size and pulmonary vascularity are normal. No adenopathy. A safety pin overlies the left chest. IMPRESSION: Tube and catheter positions as described without evident pneumothorax. Slight right base atelectasis. No edema or consolidation. Stable cardiac silhouette. Electronically Signed   By: Lowella Grip III M.D.   On: 08/06/2017 10:30   Scheduled Meds: . Chlorhexidine Gluconate Cloth  6 each Topical Q0600  . sodium chloride flush  10-40 mL Intracatheter Q12H  . sodium phosphate  1 enema Rectal Once   Continuous Infusions: . dextrose 5 % and 0.9 % NaCl with KCl 20 mEq/L 65 mL/hr at 08/06/17 1844  . Marland KitchenTPN (CLINIMIX-E) Adult     And  . fat emulsion    . heparin 1,300 Units/hr (08/07/17 0811)  . Marland KitchenTPN (CLINIMIX-E) Adult 75 mL/hr at 08/06/17 1808    Marzetta Board, MD, PhD [facility] Pager [phone]  If 7PM-7AM, please contact night-coverage [email or web address] Password [email or web address] 08/07/2017, 1:13 PM

## 2017-08-07 NOTE — Progress Notes (Signed)
ANTICOAGULATION CONSULT NOTE -   Pharmacy Consult for Heparin Indication: hx DVT- bridge while off Xarelto  No Known Allergies  Patient Measurements: Height: 5\' 9"  (175.3 cm) Weight: 141 lb 15.6 oz (64.4 kg) IBW/kg (Calculated) : 70.7 Heparin Dosing Weight: 56.9 kg (act BW)  Vital Signs: Temp: 99 F (37.2 C) (08/11 0540) Temp Source: Oral (08/11 0540) BP: 118/80 (08/11 0540) Pulse Rate: 114 (08/11 0540)  Labs:  Recent Labs  08/05/17 0430  08/05/17 2343 08/06/17 0539 08/06/17 1208 08/07/17 0550  HGB 9.2*  --   --  9.7*  --  8.6*  HCT 27.9*  --   --  29.0*  --  25.5*  PLT 115*  --   --  111*  --  102*  HEPARINUNFRC 0.25*  < > 0.39  --  0.43 0.33  CREATININE 0.75  --   --  0.72  --  0.60*  < > = values in this interval not displayed. Estimated Creatinine Clearance: 91.7 mL/min (A) (by C-G formula based on SCr of 0.6 mg/dL (L)).  Assessment: 56 y.o M on Xarelto for hx DVT was admitted with bowel obstruction secondary to metastatic colon cancer. Orders were received to switch anticoagulation to IV heparin during hospitalization in anticipation of surgery or other invasive procedures.  Significant events:  8/7: Sigmoidoscopy- Malignant completely obstructing tumor in the descending colon. 8/8: diverting colostomy -- heparin drip turned off at 0730 for procedure  08/04/17 -Heparin resumed ~6 hours postoperatively  08/07/2017  Heparin level therapeutic on 1300 units/hr infusion Hgb low but stable Pltc low, slightly improved.  Note that pltc had recovered after the 7/10 chemotherapy and was WNL on admission, then began falling after heparin started.  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   Discussed with Dr. Cruzita Lederer - at present he believes HIT is unlikely so not checking HIT panel, but will continue to follow platelet count and reassess as needed.  08/07/2017 -heparin level therapeutic at 0.33 on 1300 units/hr.  PLTC 102 - stable, Hg 9.7>8.6.  No bleeding reported.    Goal of Therapy:  Heparin level 0.3 - 0.7 Monitor platelets by anticoagulation protocol: Yes   Plan: Continue heparin drip at 1300 units/hr Daily HL and CBC while on heparin infusion. To transition back to xarelto once able to take [place], Pharm.D. [phone] 08/07/2017 7:45 AM

## 2017-08-07 NOTE — Progress Notes (Signed)
Assessment SBO due to metastatic adenocarcinoma of colon- S/P Transverse loop colostomy 08/04/17 Dr. Robet Leu ileus, high bilious ng output; colostomy viable, retracted.  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 Barry Dienes as previously planned. Sepsis   DVT RLE onHeparin drip  Severe PCM- prealbumin 6.4 on 08/01/17; on TPN.  Plan:  Wait for return of bowel function.   LOS: 9 days     3 Days Post-Op  Chief Complaint/Subjective: Sore around loop colostomy.  Objective: Vital signs in last 24 hours: Temp:  [98.7 F (37.1 C)-100 F (37.8 C)] 99 F (37.2 C) (08/11 0540) Pulse Rate:  [114-118] 114 (08/11 0540) Resp:  [14-16] 14 (08/11 0540) BP: (118-132)/(80-89) 118/80 (08/11 0540) SpO2:  [96 %-100 %] 98 % (08/11 0540) Last BM Date: 08/05/17  Intake/Output from previous day: 08/10 0701 - 08/11 0700 In: 10 [I.V.:10] Out: 4795 [Urine:2075; Emesis/NG output:2450; Drains:16; Stool:254] Intake/Output this shift: Total I/O In: -  Out: 300 [Urine:300]  PE: General- In NAD.  Awake and alert. Abdomen-firm and distended, open midline wound is clean, hypoactive bowel sounds;  loop colostomy purple, edematous, retracted, bar in place  Lab Results:   Recent Labs  08/06/17 0539 08/07/17 0550  WBC 11.5* 9.2  HGB 9.7* 8.6*  HCT 29.0* 25.5*  PLT 111* 102*   BMET  Recent Labs  08/06/17 0539 08/07/17 0550  NA 129* 130*  K 4.1 4.3  CL 100* 98*  CO2 22 23  GLUCOSE 124* 123*  BUN 14 14  CREATININE 0.72 0.60*  CALCIUM 8.4* 8.3*   PT/INR No results for input(s): LABPROT, INR in the last 72 hours. Comprehensive Metabolic Panel:    Component Value Date/Time   NA 130 (L) 08/07/2017 0550   NA 129 (L) 08/06/2017 0539   NA 135 (L)  07/26/2017 1014   NA 134 (L) 07/06/2017 0823   K 4.3 08/07/2017 0550   K 4.1 08/06/2017 0539   K 3.3 (L) 07/26/2017 1014   K 3.6 07/06/2017 0823   CL 98 (L) 08/07/2017 0550   CL 100 (L) 08/06/2017 0539   CO2 23 08/07/2017 0550   CO2 22 08/06/2017 0539   CO2 25 07/26/2017 1014   CO2 23 07/06/2017 0823   BUN 14 08/07/2017 0550   BUN 14 08/06/2017 0539   BUN 9.8 07/26/2017 1014   BUN 5.1 (L) 07/06/2017 0823   CREATININE 0.60 (L) 08/07/2017 0550   CREATININE 0.72 08/06/2017 0539   CREATININE 1.0 07/26/2017 1014   CREATININE 0.8 07/06/2017 0823   GLUCOSE 123 (H) 08/07/2017 0550   GLUCOSE 124 (H) 08/06/2017 0539   GLUCOSE 129 07/26/2017 1014   GLUCOSE 102 07/06/2017 0823   CALCIUM 8.3 (L) 08/07/2017 0550   CALCIUM 8.4 (L) 08/06/2017 0539   CALCIUM 9.3 07/26/2017 1014   CALCIUM 9.3 07/06/2017 0823   AST 20 08/06/2017 0539   AST 18 08/05/2017 0430   AST 24 07/26/2017 1014   AST 22 07/06/2017 0823   ALT 7 (L) 08/06/2017 0539   ALT 7 (L) 08/05/2017 0430   ALT 6 07/26/2017 1014   ALT 7 07/06/2017 0823   ALKPHOS 45 08/06/2017 0539   ALKPHOS 36 (L) 08/05/2017 0430   ALKPHOS 87 07/26/2017 1014   ALKPHOS 100 07/06/2017 0823   BILITOT 0.2 (L) 08/06/2017  0539   BILITOT 0.4 08/05/2017 0430   BILITOT 0.23 07/26/2017 1014   BILITOT 0.59 07/06/2017 0823   PROT 6.6 08/06/2017 0539   PROT 6.3 (L) 08/05/2017 0430   PROT 8.2 07/26/2017 1014   PROT 8.0 07/06/2017 0823   ALBUMIN 2.6 (L) 08/06/2017 0539   ALBUMIN 2.6 (L) 08/05/2017 0430   ALBUMIN 3.3 (L) 07/26/2017 1014   ALBUMIN 2.9 (L) 07/06/2017 0823     Studies/Results: Dg Chest Port 1 View  Result Date: 08/06/2017 CLINICAL DATA:  Wheezing EXAM: PORTABLE CHEST 1 VIEW COMPARISON:  July 29, 2017 FINDINGS: Port-A-Cath tip is at the cavoatrial junction. Nasogastric tube tip and side-port in the stomach. A drain is present in the left mid abdomen laterally. No pneumothorax. There is slight right base atelectasis. Lungs elsewhere clear.  Heart size and pulmonary vascularity are normal. No adenopathy. A safety pin overlies the left chest. IMPRESSION: Tube and catheter positions as described without evident pneumothorax. Slight right base atelectasis. No edema or consolidation. Stable cardiac silhouette. Electronically Signed   By: Lowella Grip III M.D.   On: 08/06/2017 10:30    Anti-infectives: Anti-infectives    Start     Dose/Rate Route Frequency Ordered Stop   08/04/17 2200  piperacillin-tazobactam (ZOSYN) IVPB 3.375 g     3.375 g 12.5 mL/hr over 240 Minutes Intravenous Every 8 hours 08/04/17 1706 08/05/17 2244   08/04/17 0951  cefoTEtan in Dextrose 5% (CEFOTAN) 2-2.08 GM-% IVPB    Comments:  Algis Liming   : cabinet override      08/04/17 0951 08/04/17 1102   08/04/17 0945  cefoTEtan in Dextrose 5% (CEFOTAN) IVPB 2 g     2 g Intravenous  Once 08/04/17 0820 08/04/17 1117   08/04/17 0800  cefoTEtan (CEFOTAN) 2 g in dextrose 5 % 50 mL IVPB  Status:  Discontinued     2 g 100 mL/hr over 30 Minutes Intravenous On call to O.R. 08/04/17 0746 08/04/17 0819   07/29/17 2359  piperacillin-tazobactam (ZOSYN) IVPB 3.375 g  Status:  Discontinued     3.375 g 12.5 mL/hr over 240 Minutes Intravenous Every 8 hours 07/29/17 1729 08/04/17 1706   07/29/17 1730  piperacillin-tazobactam (ZOSYN) IVPB 3.375 g     3.375 g 100 mL/hr over 30 Minutes Intravenous  Once 07/29/17 1728 07/29/17 1853       Jeff [name] J 08/07/2017

## 2017-08-08 ENCOUNTER — Inpatient Hospital Stay (HOSPITAL_COMMUNITY): Payer: Medicaid Other

## 2017-08-08 LAB — CBC
HCT: 26 % — ABNORMAL LOW (ref 39.0–52.0)
Hemoglobin: 8.7 g/dL — ABNORMAL LOW (ref 13.0–17.0)
MCH: 28.2 pg (ref 26.0–34.0)
MCHC: 33.5 g/dL (ref 30.0–36.0)
MCV: 84.1 fL (ref 78.0–100.0)
PLATELETS: 99 10*3/uL — AB (ref 150–400)
RBC: 3.09 MIL/uL — ABNORMAL LOW (ref 4.22–5.81)
RDW: 14.8 % (ref 11.5–15.5)
WBC: 8.3 10*3/uL (ref 4.0–10.5)

## 2017-08-08 LAB — BASIC METABOLIC PANEL
ANION GAP: 9 (ref 5–15)
BUN: 14 mg/dL (ref 6–20)
CHLORIDE: 98 mmol/L — AB (ref 101–111)
CO2: 26 mmol/L (ref 22–32)
Calcium: 8.5 mg/dL — ABNORMAL LOW (ref 8.9–10.3)
Creatinine, Ser: 0.55 mg/dL — ABNORMAL LOW (ref 0.61–1.24)
Glucose, Bld: 130 mg/dL — ABNORMAL HIGH (ref 65–99)
POTASSIUM: 4.1 mmol/L (ref 3.5–5.1)
SODIUM: 133 mmol/L — AB (ref 135–145)

## 2017-08-08 LAB — HEPARIN LEVEL (UNFRACTIONATED): HEPARIN UNFRACTIONATED: 0.1 [IU]/mL — AB (ref 0.30–0.70)

## 2017-08-08 LAB — MAGNESIUM: MAGNESIUM: 1.8 mg/dL (ref 1.7–2.4)

## 2017-08-08 MED ORDER — MAGNESIUM SULFATE 2 GM/50ML IV SOLN
2.0000 g | Freq: Once | INTRAVENOUS | Status: AC
  Administered 2017-08-08: 2 g via INTRAVENOUS
  Filled 2017-08-08: qty 50

## 2017-08-08 MED ORDER — HEPARIN (PORCINE) IN NACL 100-0.45 UNIT/ML-% IJ SOLN
1200.0000 [IU]/h | INTRAMUSCULAR | Status: DC
  Filled 2017-08-08 (×2): qty 250

## 2017-08-08 MED ORDER — TRACE MINERALS CR-CU-MN-SE-ZN 10-1000-500-60 MCG/ML IV SOLN
INTRAVENOUS | Status: AC
  Administered 2017-08-08: 18:00:00 via INTRAVENOUS
  Filled 2017-08-08: qty 1800

## 2017-08-08 MED ORDER — PANTOPRAZOLE SODIUM 40 MG IV SOLR
40.0000 mg | Freq: Two times a day (BID) | INTRAVENOUS | Status: DC
  Administered 2017-08-08 – 2017-08-17 (×19): 40 mg via INTRAVENOUS
  Filled 2017-08-08 (×19): qty 40

## 2017-08-08 MED ORDER — FAT EMULSION 20 % IV EMUL
240.0000 mL | INTRAVENOUS | Status: AC
  Administered 2017-08-08: 240 mL via INTRAVENOUS
  Filled 2017-08-08: qty 250

## 2017-08-08 MED ORDER — HEPARIN (PORCINE) IN NACL 100-0.45 UNIT/ML-% IJ SOLN
1400.0000 [IU]/h | INTRAMUSCULAR | Status: DC
  Administered 2017-08-08 – 2017-08-09 (×2): 1400 [IU]/h via INTRAVENOUS
  Filled 2017-08-08 (×2): qty 250

## 2017-08-08 NOTE — Progress Notes (Signed)
[facility] NOTE   Pharmacy Consult for TPN Indication: intolerance to enteral feeds  Patient Measurements: Height: 5\' 9"  (175.3 cm) Weight: 141 lb 15.6 oz (64.4 kg) IBW/kg (Calculated) : 70.7 TPN AdjBW (KG): 55.3 Body mass index is 20.97 kg/m.   Insulin Requirements:  DC'd  SSI/CBGs 8/11  Current Nutrition: NPO  IVF: D5NS + KCl 20 mEq/L running at 65 mL/hr  Central access: PICC TPN start date: 8/7  ASSESSMENT                                                                                                          HPI:  56 y.o.malewith medical hx significant for metastatic colon cancer, on chemo, pancreatic abscess with percutaneous drain, iron deficiency anemia, and hx of right leg DVT, on Xarelto.  Presented to the ED for evaluation of 3 days of abdominal pain with nausea, vomiting, and anorexia. CT of the abdomen and pelvis at the time of admission revealed a bowel obstruction at the level of the descending colon at the location of the percutaneous drainage catheter.  Significant events:  8/7: Sigmoidoscopy- Malignant completely obstructing tumor in the                                    descending colon. 8/8: Transverse Loop Colostomy   Recent Labs  08/06/17 0539 08/07/17 0550 08/08/17 0601  NA 129* 130* 133*  K 4.1 4.3 4.1  CL 100* 98* 98*  CO2 22 23 26   GLUCOSE 124* 123* 130*  BUN 14 14 14   CREATININE 0.72 0.60* 0.55*  CALCIUM 8.4* 8.3* 8.5*  PHOS 3.3 3.7  --   MG 1.6* 1.6* 1.8  ALBUMIN 2.6*  --   --   ALKPHOS 45  --   --   AST 20  --   --   ALT 7*  --   --   BILITOT 0.2*  --   --   Corr Ca 9.5   Today:   Glucose (goal <150): no hx DM;  SSI and CBGs stopped on 8/11  Electrolytes:  Mg 1.6 > 1.8 after 4 gm IV yesterday; Na low at 133 but improving  Renal: SCr WNL, stable  LFTs: below ULN  TGs: 445 (8/8), 29 (8/9)  Prealbumin - 6.4 (8/4), 9.8 (8/8)  I/O (-) 717 ml ;  NG output 2600 mL/24 hr, colostomy  500 ml   NUTRITIONAL GOALS                                                                                             RD  recs (8/10): Kcal:  1880-2050 (33-36 kcal/kg) Protein:  85-97 grams (1.5-1.7 grams/kg)  Clinimix E 5/20 at a goal rate of 52ml/hr + 20% fat emulsion at 41ml/hr x 12 hours  to provide: 90 g/day protein, 2064 Kcal/day.  PLAN                                                                                                                         This AM:  Magnesium sulfate 2 gm IV x1  At 1800 today:  Continue Clinimix E 5/20 at goal rate of 75 mL/hr  Continue 20% fat emulsion at 69ml/hr x 12 hours daily  TPN to contain standard multivitamins and trace elements.  continue mIVF at 65 mL/hr or per MD  TPN lab panels on Mondays & Thursdays.  Eudelia Bunch, Pharm.D. [phone] 08/08/2017 7:24 AM

## 2017-08-08 NOTE — Progress Notes (Signed)
As I emptied pt's colostomy I noticed he has large clots of blood mixed in with the stool. Notified provider on call, obtained order to d/c the  IV heparin and obtain CBC. Will continue to monitor.

## 2017-08-08 NOTE — Progress Notes (Signed)
Assessment SBO due to metastatic adenocarcinoma of colon- S/P Transverse loop colostomy 08/04/17 Dr. 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 Barry Dienes as previously planned. Sepsis   DVT RLE- Severe PCM- prealbumin 6.4 on 08/01/17; on TPN.  Plan:  Restart Heparin at a lower rate. Wait for consistent bowel function with decreased ng output   LOS: 10 days     4 Days Post-Op  Chief Complaint/Subjective: Had blood clots per colostomy earlier this morning.  Heparin stopped.   Objective: Vital signs in last 24 hours: Temp:  [98.4 F (36.9 C)-99 F (37.2 C)] 98.4 F (36.9 C) (08/12 0555) Pulse Rate:  [97-108] 108 (08/12 0555) Resp:  [12-18] 18 (08/12 0555) BP: (117-131)/(79-87) 129/79 (08/12 0555) SpO2:  [97 %-100 %] 98 % (08/12 0555) Last BM Date: 08/05/17  Intake/Output from previous day: 08/11 0701 - 08/12 0700 In: 4733 [I.V.:4733] Out: 5450 [Urine:1850; Emesis/NG output:2600; Drains:500; Stool:500] Intake/Output this shift: No intake/output data recorded.  PE: General- In NAD.  Awake and alert. Abdomen-firm and distended, open midline wound is clean with old bloody drainage;  loop colostomy purple, edematous, retracted, bar in place-no change from yesterday except there is some green liquid stool in bag  Lab Results:   Recent Labs  08/07/17 0550 08/08/17 0601  WBC 9.2 8.3  HGB 8.6* 8.7*  HCT 25.5* 26.0*  PLT 102* 99*   BMET  Recent Labs  08/07/17 0550 08/08/17 0601  NA 130* 133*  K 4.3 4.1  CL 98* 98*  CO2 23 26  GLUCOSE 123* 130*  BUN 14 14  CREATININE 0.60* 0.55*  CALCIUM 8.3* 8.5*   PT/INR No results for input(s):  LABPROT, INR in the last 72 hours. Comprehensive Metabolic Panel:    Component Value Date/Time   NA 133 (L) 08/08/2017 0601   NA 130 (L) 08/07/2017 0550   NA 135 (L) 07/26/2017 1014   NA 134 (L) 07/06/2017 0823   K 4.1 08/08/2017 0601   K 4.3 08/07/2017 0550   K 3.3 (L) 07/26/2017 1014   K 3.6 07/06/2017 0823   CL 98 (L) 08/08/2017 0601   CL 98 (L) 08/07/2017 0550   CO2 26 08/08/2017 0601   CO2 23 08/07/2017 0550   CO2 25 07/26/2017 1014   CO2 23 07/06/2017 0823   BUN 14 08/08/2017 0601   BUN 14 08/07/2017 0550   BUN 9.8 07/26/2017 1014   BUN 5.1 (L) 07/06/2017 0823   CREATININE 0.55 (L) 08/08/2017 0601   CREATININE 0.60 (L) 08/07/2017 0550   CREATININE 1.0 07/26/2017 1014   CREATININE 0.8 07/06/2017 0823   GLUCOSE 130 (H) 08/08/2017 0601   GLUCOSE 123 (H) 08/07/2017 0550   GLUCOSE 129 07/26/2017 1014   GLUCOSE 102 07/06/2017 0823   CALCIUM 8.5 (L) 08/08/2017 0601   CALCIUM 8.3 (L) 08/07/2017 0550   CALCIUM 9.3 07/26/2017 1014   CALCIUM 9.3 07/06/2017 0823   AST 20 08/06/2017 0539   AST 18 08/05/2017 0430   AST 24 07/26/2017 1014   AST 22 07/06/2017 0823   ALT 7 (L) 08/06/2017 0539   ALT 7 (L) 08/05/2017 0430   ALT 6 07/26/2017 1014   ALT 7  07/06/2017 0823   ALKPHOS 45 08/06/2017 0539   ALKPHOS 36 (L) 08/05/2017 0430   ALKPHOS 87 07/26/2017 1014   ALKPHOS 100 07/06/2017 0823   BILITOT 0.2 (L) 08/06/2017 0539   BILITOT 0.4 08/05/2017 0430   BILITOT 0.23 07/26/2017 1014   BILITOT 0.59 07/06/2017 0823   PROT 6.6 08/06/2017 0539   PROT 6.3 (L) 08/05/2017 0430   PROT 8.2 07/26/2017 1014   PROT 8.0 07/06/2017 0823   ALBUMIN 2.6 (L) 08/06/2017 0539   ALBUMIN 2.6 (L) 08/05/2017 0430   ALBUMIN 3.3 (L) 07/26/2017 1014   ALBUMIN 2.9 (L) 07/06/2017 0823     Studies/Results: Dg Chest Port 1 View  Result Date: 08/06/2017 CLINICAL DATA:  Wheezing EXAM: PORTABLE CHEST 1 VIEW COMPARISON:  July 29, 2017 FINDINGS: Port-A-Cath tip is at the cavoatrial junction.  Nasogastric tube tip and side-port in the stomach. A drain is present in the left mid abdomen laterally. No pneumothorax. There is slight right base atelectasis. Lungs elsewhere clear. Heart size and pulmonary vascularity are normal. No adenopathy. A safety pin overlies the left chest. IMPRESSION: Tube and catheter positions as described without evident pneumothorax. Slight right base atelectasis. No edema or consolidation. Stable cardiac silhouette. Electronically Signed   By: Lowella Grip III M.D.   On: 08/06/2017 10:30    Anti-infectives: Anti-infectives    Start     Dose/Rate Route Frequency Ordered Stop   08/04/17 2200  piperacillin-tazobactam (ZOSYN) IVPB 3.375 g     3.375 g 12.5 mL/hr over 240 Minutes Intravenous Every 8 hours 08/04/17 1706 08/05/17 2244   08/04/17 0951  cefoTEtan in Dextrose 5% (CEFOTAN) 2-2.08 GM-% IVPB    Comments:  Algis Liming   : cabinet override      08/04/17 0951 08/04/17 1102   08/04/17 0945  cefoTEtan in Dextrose 5% (CEFOTAN) IVPB 2 g     2 g Intravenous  Once 08/04/17 0820 08/04/17 1117   08/04/17 0800  cefoTEtan (CEFOTAN) 2 g in dextrose 5 % 50 mL IVPB  Status:  Discontinued     2 g 100 mL/hr over 30 Minutes Intravenous On call to O.R. 08/04/17 0746 08/04/17 0819   07/29/17 2359  piperacillin-tazobactam (ZOSYN) IVPB 3.375 g  Status:  Discontinued     3.375 g 12.5 mL/hr over 240 Minutes Intravenous Every 8 hours 07/29/17 1729 08/04/17 1706   07/29/17 1730  piperacillin-tazobactam (ZOSYN) IVPB 3.375 g     3.375 g 100 mL/hr over 30 Minutes Intravenous  Once 07/29/17 1728 07/29/17 1853       Leilyn Frayre J 08/08/2017

## 2017-08-08 NOTE — Progress Notes (Addendum)
ANTICOAGULATION CONSULT NOTE -   Pharmacy Consult for Heparin Indication: hx DVT- bridge while off Xarelto  No Known Allergies  Patient Measurements: Height: 5\' 9"  (175.3 cm) Weight: 141 lb 15.6 oz (64.4 kg) IBW/kg (Calculated) : 70.7 Heparin Dosing Weight: 56.9 kg (act BW)  Vital Signs: Temp: 98.4 F (36.9 C) (08/12 0555) Temp Source: Oral (08/12 0555) BP: 129/79 (08/12 0555) Pulse Rate: 108 (08/12 0555)  Labs:  Recent Labs  08/05/17 2343  08/06/17 0539 08/06/17 1208 08/07/17 0550 08/08/17 0601  HGB  --   < > 9.7*  --  8.6* 8.7*  HCT  --   --  29.0*  --  25.5* 26.0*  PLT  --   --  111*  --  102* 99*  HEPARINUNFRC 0.39  --   --  0.43 0.33  --   CREATININE  --   --  0.72  --  0.60* 0.55*  < > = values in this interval not displayed. Estimated Creatinine Clearance: 91.7 mL/min (A) (by C-G formula based on SCr of 0.55 mg/dL (L)).  Assessment: 56 y.o M on Xarelto for hx DVT was admitted with bowel obstruction secondary to metastatic colon cancer. Orders were received to switch anticoagulation to IV heparin during hospitalization in anticipation of surgery or other invasive procedures.  Significant events:  8/7: Sigmoidoscopy- Malignant completely obstructing tumor in the descending colon. 8/8: diverting colostomy -- heparin drip turned off at 0730 for procedure  08/04/17 -Heparin resumed ~6 hours postoperatively  08/08/2017  Heparin level therapeutic on 1300 units/hr infusion Hgb low but stable Pltc low, slightly improved.  Note that pltc had recovered after the 7/10 chemotherapy and was WNL on admission, then began falling after heparin started.  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   Discussed with Dr. Cruzita Lederer - at present he believes HIT is unlikely so not checking HIT panel, but will continue to follow platelet count and reassess as needed.  08/08/2017 Heparin discontinued at 05 am 2nd to large clots of blood mixed in with stool in colostomy bag.  Hg 8.7 -  stable, pltc 99, low but stable. Repeat CBC ordered.  Mention of NG trauma in [facility] note 8/10 D/w Dr Zella Richer - restart heparin with no bolus at a lower rate.   Goal of Therapy:  Heparin level adjusted to 0.3 - 0.5 on 8/12 2nd bleeding Monitor platelets by anticoagulation protocol: Yes   Plan: Add protonix 40 IV BID Resume heparin with no bolus as lower rate of 1200 units/hr and check 8 hr HL Daily heparin level and CBC while on heparin  Eudelia Bunch, Pharm.D. [phone] 08/08/2017 7:20 AM

## 2017-08-08 NOTE — Progress Notes (Signed)
PROGRESS NOTE  Jeff [name] NWG:[id number] [name]: 22-Jan-1961 [name]: 07/29/2017 PCP: [name], MD   LOS: 10 days   Brief Narrative / Interim history: 56 y.o.malewith medical history significant for metastatic colon cancer on chemotherapy, pancreatic abscess with percutaneous drain, iron deficiency anemia, and history of right leg DVT on Xarelto, now presenting to the emergency department for evaluation of 3 days of abdominal pain with nausea, vomiting, and anorexia. Patient reports that he was in his usual state until approximately 3 days ago when he noted the fairly sudden onset of pain in the mid abdomen with nausea. He went on to develop nonbloody nonbilious vomiting, had a couple loose stools, and has had loss of appetite. CT of the abdomen and pelvis at the time of admission revealed a bowel obstruction at the level of the descending colon at the location of the percutaneous drainage catheter. There was wall thickening secondary to colonic neoplasm. There was dilated fluid-filled appendix, similar to the dilated loops of colon and small bowel, compatible with dilatation due to the bowel obstruction.  Mild decrease in size of the multiloculated fluid collection in the splenic hilum and distal tail of the pancreas. GI, General Surgery and Med/Onc were consulted to assist in management. On 08/03/2017, there was an unsuccessful attempt to endoscopically stent colon.  Now surgical intervention is planned.  The patient was recently discharged after admission from 07/09/2017 through 07/14/2017 during which the patient was treated for sepsis secondary to pancreatic Abscess. The Patient Had a Percutaneous Drain Placed by IR on 07/11/2017, and He Was Subsequently [facility] with Amox/Clav. IR tube changed on 07/19/17.  Assessment & Plan: Principal Problem:   Bowel obstruction (HCC) Active Problems:   Primary colon cancer with metastasis to other site ([place])   Anemia, iron deficiency   History of deep  vein thrombosis (DVT) of lower extremity   Intra-abdominal abscess    Pancreatic abscess   Malignant neoplasm of splenic flexure (HCC)   Malnutrition of moderate degree   Severe malnutrition (HCC)   Colon obstruction (HCC)   Colonic obstruction -likely related in the setting of colon cancer, his obstruction was at the level of splenic flexure -Gastroenterology was consulted, and [name] attempted a stent placement on 8/7 which was unsuccessful -Given this, general surgery took patient to the operating room on 8/8 and patient underwent a transverse loop colostomy, bypassing the metastatic left colon cancer altogether -Postop recovery per general surgery  Metastatic colon adenocarcinoma -diagnosed 01/12/17, on palliative chemotherapy FOLFOX and panitumumab, last cycle on 07/06/2017.  He is seeing [name], consulted.  He has metastatic disease to the pancreas, spleen as well as peritoneal metastasis.  Overall very poor prognosis, further treatment as an outpatient  Pancreatic abscess -perc drain changed 07/19/17--management per surgery/IR, will likely need to be followed up in drain clinic as an outpatient.  Appreciate interventional radiology. Off antibiotics now.  Cultures from 07/11/17 showed Bacteroides Ovatus.  The drain to be evaluated by radiology on 8/13  DVT in right lower extremity -diagnosed 02/17/17, on home rivaroxaban, now on heparin infusion periprocedural.  Resume Xarelto per surgery when OK to take in po  Anemia and pancytopenia secondary to chemotherapy - blood loss anemia contributing some, patient has been having small amounts of bright red blood in his ostomy bag, hemoglobin slight downtrending, 9.7 >> 8.6 >> 8.7  -heparin stopped briefly this morning, resume  Severe malnutrition -appreciate nutrition eval, start supplements when able to tolerate po, for now continue TPN  Hypokalemia -  replete, -check mag--1.6 today. Replete  Goals of Care -remains full code   DVT  prophylaxis: Heparin infusion Code Status: Full code Family Communication: No family at bedside, d/w wife last night Disposition Plan: Home versus SNF when ready, discharge readiness per general surgery postop  Consultants:   General surgery  Gastroenterology  Interventional radiology  Procedures:   None   Antimicrobials:  Zosyn 8/2 >>   Subjective: -complains of abdominal pain  Objective: Vitals:   08/07/17 0540 08/07/17 1400 08/07/17 2230 08/08/17 0555  BP: 118/80 117/87 131/82 129/79  Pulse: (!) 114 97 (!) 103 (!) 108  Resp: 14 12 16 18   Temp: 99 F (37.2 C) 99 F (37.2 C) 98.7 F (37.1 C) 98.4 F (36.9 C)  TempSrc: Oral Oral Oral Oral  SpO2: 98% 100% 97% 98%  Weight:      Height:        Intake/Output Summary (Last 24 hours) at 08/08/17 1254 Last data filed at 08/08/17 1000  Gross per 24 hour  Intake             3520 ml  Output             4835 ml  Net            -1315 ml   Filed Weights   08/02/17 0533 08/03/17 0500 08/06/17 0500  Weight: 55.6 kg (122 lb 9.2 oz) 59.2 kg (130 lb 8.2 oz) 64.4 kg (141 lb 15.6 oz)    Examination:  Vitals:   08/07/17 0540 08/07/17 1400 08/07/17 2230 08/08/17 0555  BP: 118/80 117/87 131/82 129/79  Pulse: (!) 114 97 (!) 103 (!) 108  Resp: 14 12 16 18   Temp: 99 F (37.2 C) 99 F (37.2 C) 98.7 F (37.1 C) 98.4 F (36.9 C)  TempSrc: Oral Oral Oral Oral  SpO2: 98% 100% 97% 98%  Weight:      Height:        Constitutional: NAD Respiratory: CTA Cardiovascular: RRR Abdomen: ostomy bag in place with dark material and scant bleeding    Data Reviewed: I have independently reviewed following labs and imaging studies   CBC:  Recent Labs Lab 08/04/17 0741 08/05/17 0430 08/06/17 0539 08/07/17 0550 08/08/17 0601  WBC 8.1 11.1* 11.5* 9.2 8.3  NEUTROABS 5.5  --   --   --   --   HGB 9.3* 9.2* 9.7* 8.6* 8.7*  HCT 28.8* 27.9* 29.0* 25.5* 26.0*  MCV 85.0 83.8 84.5 82.8 84.1  PLT 104* 115* 111* 102* 99*   Basic  Metabolic Panel:  Recent Labs Lab 08/04/17 0741 08/05/17 0430 08/06/17 0539 08/07/17 0550 08/08/17 0601  NA 140 135 129* 130* 133*  K 3.6 4.3 4.1 4.3 4.1  CL 109 104 100* 98* 98*  CO2 25 25 22 23 26   GLUCOSE 127* 144* 124* 123* 130*  BUN 5* 12 14 14 14   CREATININE 0.81 0.75 0.72 0.60* 0.55*  CALCIUM 8.8* 8.4* 8.4* 8.3* 8.5*  MG 1.7 1.5* 1.6* 1.6* 1.8  PHOS 4.0 3.6 3.3 3.7  --    GFR: Estimated Creatinine Clearance: 91.7 mL/min (A) (by C-G formula based on SCr of 0.55 mg/dL (L)). Liver Function Tests:  Recent Labs Lab 08/04/17 0741 08/05/17 0430 08/06/17 0539  AST 15 18 20   ALT 6* 7* 7*  ALKPHOS 40 36* 45  BILITOT 0.4 0.4 0.2*  PROT 7.4 6.3* 6.6  ALBUMIN 3.0* 2.6* 2.6*   No results for input(s): LIPASE, AMYLASE in the last 168 hours.  No results for input(s): AMMONIA in the last 168 hours. Coagulation Profile: No results for input(s): INR, PROTIME in the last 168 hours. Cardiac Enzymes: No results for input(s): CKTOTAL, CKMB, CKMBINDEX, TROPONINI in the last 168 hours. BNP (last 3 results) No results for input(s): PROBNP in the last 8760 hours. HbA1C: No results for input(s): HGBA1C in the last 72 hours. CBG:  Recent Labs Lab 08/06/17 0553 08/06/17 1312 08/06/17 1802 08/06/17 2331 08/07/17 0535  GLUCAP 137* 117* 107* 129* 124*   Lipid Profile: No results for input(s): CHOL, HDL, LDLCALC, TRIG, CHOLHDL, LDLDIRECT in the last 72 hours. Thyroid Function Tests: No results for input(s): TSH, T4TOTAL, FREET4, T3FREE, THYROIDAB in the last 72 hours. Anemia Panel: No results for input(s): VITAMINB12, FOLATE, FERRITIN, TIBC, IRON, RETICCTPCT in the last 72 hours. Urine analysis:    Component Value Date/Time   COLORURINE YELLOW 07/10/2017 0045   APPEARANCEUR CLEAR 07/10/2017 0045   LABSPEC 1.009 07/10/2017 0045   PHURINE 5.0 07/10/2017 0045   GLUCOSEU NEGATIVE 07/10/2017 0045   HGBUR MODERATE (A) 07/10/2017 0045   BILIRUBINUR NEGATIVE 07/10/2017 0045    KETONESUR NEGATIVE 07/10/2017 0045   PROTEINUR NEGATIVE 07/10/2017 0045   NITRITE NEGATIVE 07/10/2017 0045   LEUKOCYTESUR NEGATIVE 07/10/2017 0045   Sepsis Labs: Invalid input(s): PROCALCITONIN, LACTICIDVEN  No results found for this or any previous visit (from the past 240 hour(s)).    Radiology Studies: Dg Chest Port 1 View  Result Date: 08/08/2017 CLINICAL DATA:  Cough today EXAM: PORTABLE CHEST 1 VIEW COMPARISON:  08/06/2017 FINDINGS: Stable right jugular Port-A-Cath. Normal heart size. Bibasilar atelectasis. No pneumothorax. NG tube tip is in the proximal duodenum. IMPRESSION: Bibasilar atelectasis. NG tube tip in the proximal duodenum.  This should be retracted. Electronically Signed   By: Marybelle Killings M.D.   On: 08/08/2017 10:57   Scheduled Meds: . Chlorhexidine Gluconate Cloth  6 each Topical Q0600  . pantoprazole (PROTONIX) IV  40 mg Intravenous Q12H  . sodium chloride flush  10-40 mL Intracatheter Q12H   Continuous Infusions: . dextrose 5 % and 0.9 % NaCl with KCl 20 mEq/L 65 mL/hr at 08/08/17 0425  . Marland KitchenTPN (CLINIMIX-E) Adult     And  . fat emulsion    . heparin 1,200 Units/hr (08/08/17 1016)  . Marland KitchenTPN (CLINIMIX-E) Adult 75 mL/hr at 08/07/17 1824    Marzetta Board, MD, PhD [facility] Pager [phone]  If 7PM-7AM, please contact night-coverage [email or web address] Password [facility] 08/08/2017, 12:54 PM

## 2017-08-08 NOTE — Progress Notes (Signed)
ANTICOAGULATION CONSULT NOTE -   Pharmacy Consult for Heparin Indication: hx DVT- bridge while off Xarelto  No Known Allergies  Patient Measurements: Height: 5\' 9"  (175.3 cm) Weight: 141 lb 15.6 oz (64.4 kg) IBW/kg (Calculated) : 70.7 Heparin Dosing Weight: 56.9 kg (act BW)  Vital Signs: Temp: 98.6 F (37 C) (08/12 1400) Temp Source: Oral (08/12 1400) BP: 130/91 (08/12 1400) Pulse Rate: 98 (08/12 1400)  Labs:  Recent Labs  08/06/17 0539 08/06/17 1208 08/07/17 0550 08/08/17 0601 08/08/17 1835  HGB 9.7*  --  8.6* 8.7*  --   HCT 29.0*  --  25.5* 26.0*  --   PLT 111*  --  102* 99*  --   HEPARINUNFRC  --  0.43 0.33  --  0.10*  CREATININE 0.72  --  0.60* 0.55*  --    Estimated Creatinine Clearance: 91.7 mL/min (A) (by C-G formula based on SCr of 0.55 mg/dL (L)).  Assessment: 56 y.o M on Xarelto for hx DVT was admitted with bowel obstruction secondary to metastatic colon cancer. Orders were received to switch anticoagulation to IV heparin during hospitalization in anticipation of surgery or other invasive procedures.  Significant events:  8/7: Sigmoidoscopy- Malignant completely obstructing tumor in the descending colon. 8/8: diverting colostomy -- heparin drip turned off at 0730 for procedure  08/04/17 -Heparin resumed ~6 hours postoperatively  08/08/2017  Heparin level therapeutic on 1300 units/hr infusion Hgb low but stable Pltc low, slightly improved.  Note that pltc had recovered after the 7/10 chemotherapy and was WNL on admission, then began falling after heparin started.  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   Discussed with Dr. Cruzita Lederer - at present he believes HIT is unlikely so not checking HIT panel, but will continue to follow platelet count and reassess as needed.  08/08/2017 Heparin discontinued at 05 am 2nd to large clots of blood mixed in with stool in colostomy bag.  Hg 8.7 - stable, pltc 99, low but stable. Repeat CBC ordered.  Mention of NG trauma  in [facility] note 8/10 D/w Dr Zella Richer - restart heparin with no bolus at a lower rate.   8/12: PM: No line or bleeding issues per RN. HL is 0.10, subtherapeutic  Goal of Therapy:  Heparin level adjusted to 0.3 - 0.5 on 8/12 2nd bleeding Monitor platelets by anticoagulation protocol: Yes   Plan: Add protonix 40 IV BID Increase heparin to 1400 units/hr and check 8 hr HL Daily heparin level and CBC while on heparin   Royetta Asal, PharmD, BCPS Pager [phone] 08/08/2017 8:15 PM

## 2017-08-08 NOTE — Consult Note (Signed)
[place] Nurse ostomy follow up Stoma type/location: RUQ, loop diverting ostomy Stomal assessment/size: slightly oval, minimally budded, pink, moist, with red rubber support rod in place.  Peristomal assessment: intact  Treatment options for stomal/peristomal skin: 2"barrier ring used because of minimally budded stoma Output dark, green stool, liquid Ostomy pouching: 1pc with 2" barrier ring.  Education provided:  Explained care of stoma, emptying pouch, cutting new pouch barrier as well today off centering of the opening to avoid open abdominal wound. Met with patient, his daughter and ex wife.  They report that their family member had a colostomy and they assisted with her care.Demonstrated pouch change with the family.  Explained importance of cleaning spout, demonstrated using toilet paper wick.  Patient able to close and open lock and roll closure with support from his daughter.  Demonstrated measuring stoma(slightly oval shaped).  Discussed obtaining supplies, he has [place] MCD will need to be informed of supplier for this. Daughter was concerned about his ability to get supplies.  Patient is not engaged at all to learn care, he does not look at his stoma, he really did not watch much of the care provided. His daughter will be primary caregiver and support. Extra supplies left in the room. Enrolled patient in [place] Start Discharge program: No-card signed.   [place] Nurse will follow along with you for continued support with ostomy teaching and care [place] MSN, RN, [place], [place], [place]

## 2017-08-09 ENCOUNTER — Ambulatory Visit: Payer: Medicaid Other | Admitting: Hematology

## 2017-08-09 ENCOUNTER — Ambulatory Visit: Payer: Medicaid Other

## 2017-08-09 ENCOUNTER — Encounter: Payer: Medicaid Other | Admitting: Nutrition

## 2017-08-09 ENCOUNTER — Encounter (HOSPITAL_COMMUNITY): Payer: Self-pay | Admitting: Interventional Radiology

## 2017-08-09 ENCOUNTER — Inpatient Hospital Stay (HOSPITAL_COMMUNITY): Payer: Medicaid Other

## 2017-08-09 ENCOUNTER — Other Ambulatory Visit: Payer: Medicaid Other

## 2017-08-09 HISTORY — PX: IR SINUS/FIST TUBE CHK-NON GI: IMG673

## 2017-08-09 LAB — COMPREHENSIVE METABOLIC PANEL
ALBUMIN: 2.6 g/dL — AB (ref 3.5–5.0)
ALK PHOS: 64 U/L (ref 38–126)
ALT: 7 U/L — ABNORMAL LOW (ref 17–63)
AST: 19 U/L (ref 15–41)
Anion gap: 6 (ref 5–15)
BILIRUBIN TOTAL: 0.4 mg/dL (ref 0.3–1.2)
BUN: 13 mg/dL (ref 6–20)
CALCIUM: 8.6 mg/dL — AB (ref 8.9–10.3)
CO2: 25 mmol/L (ref 22–32)
Chloride: 101 mmol/L (ref 101–111)
Creatinine, Ser: 0.66 mg/dL (ref 0.61–1.24)
GFR calc Af Amer: >60 mL/min (ref >60)
GFR calc non Af Amer: >60 mL/min (ref >60)
GLUCOSE: 133 mg/dL — AB (ref 65–99)
POTASSIUM: 4.5 mmol/L (ref 3.5–5.1)
Sodium: 132 mmol/L — ABNORMAL LOW (ref 135–145)
TOTAL PROTEIN: 7.1 g/dL (ref 6.5–8.1)

## 2017-08-09 LAB — DIFFERENTIAL
Basophils Absolute: 0 10*3/uL (ref 0.0–0.1)
Basophils Relative: 0 %
EOS PCT: 4 %
Eosinophils Absolute: 0.3 10*3/uL (ref 0.0–0.7)
LYMPHS ABS: 1.3 10*3/uL (ref 0.7–4.0)
LYMPHS PCT: 17 %
MONO ABS: 0.7 10*3/uL (ref 0.1–1.0)
MONOS PCT: 9 %
Neutro Abs: 5.2 10*3/uL (ref 1.7–7.7)
Neutrophils Relative %: 70 %

## 2017-08-09 LAB — CBC
HEMATOCRIT: 24.8 % — AB (ref 39.0–52.0)
Hemoglobin: 8.4 g/dL — ABNORMAL LOW (ref 13.0–17.0)
MCH: 28.7 pg (ref 26.0–34.0)
MCHC: 33.9 g/dL (ref 30.0–36.0)
MCV: 84.6 fL (ref 78.0–100.0)
PLATELETS: 100 10*3/uL — AB (ref 150–400)
RBC: 2.93 MIL/uL — AB (ref 4.22–5.81)
RDW: 14.6 % (ref 11.5–15.5)
WBC: 7.4 10*3/uL (ref 4.0–10.5)

## 2017-08-09 LAB — MAGNESIUM: Magnesium: 1.7 mg/dL (ref 1.7–2.4)

## 2017-08-09 LAB — TRIGLYCERIDES: TRIGLYCERIDES: 83 mg/dL (ref <150)

## 2017-08-09 LAB — PREALBUMIN: Prealbumin: 5.6 mg/dL — ABNORMAL LOW (ref 18–38)

## 2017-08-09 LAB — HEPARIN LEVEL (UNFRACTIONATED)
HEPARIN UNFRACTIONATED: 0.22 [IU]/mL — AB (ref 0.30–0.70)
HEPARIN UNFRACTIONATED: 0.24 [IU]/mL — AB (ref 0.30–0.70)
HEPARIN UNFRACTIONATED: 0.53 [IU]/mL (ref 0.30–0.70)

## 2017-08-09 LAB — PHOSPHORUS: Phosphorus: 4.7 mg/dL — ABNORMAL HIGH (ref 2.5–4.6)

## 2017-08-09 MED ORDER — HYDROMORPHONE HCL 1 MG/ML IJ SOLN
INTRAMUSCULAR | Status: AC
  Filled 2017-08-09: qty 1

## 2017-08-09 MED ORDER — HEPARIN (PORCINE) IN NACL 100-0.45 UNIT/ML-% IJ SOLN
1500.0000 [IU]/h | INTRAMUSCULAR | Status: DC
  Administered 2017-08-09: 1500 [IU]/h via INTRAVENOUS
  Filled 2017-08-09: qty 250

## 2017-08-09 MED ORDER — HEPARIN (PORCINE) IN NACL 100-0.45 UNIT/ML-% IJ SOLN
1600.0000 [IU]/h | INTRAMUSCULAR | Status: DC
  Administered 2017-08-09: 1600 [IU]/h via INTRAVENOUS
  Filled 2017-08-09 (×2): qty 250

## 2017-08-09 MED ORDER — MAGNESIUM SULFATE 2 GM/50ML IV SOLN
2.0000 g | Freq: Once | INTRAVENOUS | Status: AC
  Administered 2017-08-09: 2 g via INTRAVENOUS
  Filled 2017-08-09: qty 50

## 2017-08-09 MED ORDER — TRACE MINERALS CR-CU-MN-SE-ZN 10-1000-500-60 MCG/ML IV SOLN
INTRAVENOUS | Status: AC
  Administered 2017-08-09: 18:00:00 via INTRAVENOUS
  Filled 2017-08-09: qty 1800

## 2017-08-09 MED ORDER — FAT EMULSION 20 % IV EMUL
240.0000 mL | INTRAVENOUS | Status: AC
  Administered 2017-08-09: 240 mL via INTRAVENOUS
  Filled 2017-08-09: qty 250

## 2017-08-09 MED ORDER — IOPAMIDOL (ISOVUE-300) INJECTION 61%
INTRAVENOUS | Status: AC
  Administered 2017-08-09: 15 mL
  Filled 2017-08-09: qty 50

## 2017-08-09 NOTE — Progress Notes (Signed)
ANTICOAGULATION CONSULT NOTE -   Pharmacy Consult for Heparin Indication: hx DVT- bridge while off Xarelto  No Known Allergies  Patient Measurements: Height: 5\' 9"  (175.3 cm) Weight: 141 lb 15.6 oz (64.4 kg) IBW/kg (Calculated) : 70.7 Heparin Dosing Weight: 56.9 kg (act BW)  Vital Signs: Temp: 98.2 F (36.8 C) (08/12 2120) Temp Source: Oral (08/12 2120) BP: 118/79 (08/12 2120) Pulse Rate: 98 (08/12 2120)  Labs:  Recent Labs  08/07/17 0550 08/08/17 0601 08/08/17 1835 08/09/17 0523  HGB 8.6* 8.7*  --  8.4*  HCT 25.5* 26.0*  --  24.8*  PLT 102* 99*  --  100*  HEPARINUNFRC 0.33  --  0.10* 0.22*  CREATININE 0.60* 0.55*  --  0.66   Estimated Creatinine Clearance: 91.7 mL/min (by C-G formula based on SCr of 0.66 mg/dL).  Assessment: 56 y.o M on Xarelto for hx DVT was admitted with bowel obstruction secondary to metastatic colon cancer. Orders were received to switch anticoagulation to IV heparin during hospitalization in anticipation of surgery or other invasive procedures.  Significant events:  8/7: Sigmoidoscopy- Malignant completely obstructing tumor in the descending colon. 8/8: diverting colostomy -- heparin drip turned off at 0730 for procedure  08/04/17 -Heparin resumed ~6 hours postoperatively  08/09/2017  Heparin level therapeutic on 1300 units/hr infusion Hgb low but stable Pltc low, slightly improved.  Note that pltc had recovered after the 7/10 chemotherapy and was WNL on admission, then began falling after heparin started.  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   Discussed with Dr. Cruzita Lederer - at present he believes HIT is unlikely so not checking HIT panel, but will continue to follow platelet count and reassess as needed.  8/12 Heparin discontinued at 05 am 2nd to large clots of blood mixed in with stool in colostomy bag.  Hg 8.7 - stable, pltc 99, low but stable. Repeat CBC ordered.  Mention of NG trauma in [facility] note 8/10 D/w Dr Zella Richer - restart  heparin with no bolus at a lower rate.   8/12: PM: No line or bleeding issues per RN. HL is 0.10, subtherapeutic Today, 8/13 0523 HL=0.22 below goal, no bleeding or infusion issues per RN  Goal of Therapy:  Heparin level adjusted to 0.3 - 0.5 on 8/12 2nd bleeding Monitor platelets by anticoagulation protocol: Yes   Plan:  Increase heparin to 1500 units/hr and recheck 8 hr HL Daily heparin level and CBC while on heparin    Jeff [name] 08/09/2017 6:27 AM

## 2017-08-09 NOTE — Consult Note (Signed)
[place] Nurse ostomy follow up Stoma type/location: RUQ loop colostomy. Pouch applied Friday is intact. Patient is alone in room, eyes closed but awake.  Does not engage. Stomal assessment/size: Not measured today.   Peristomal assessment: Not seen today. Treatment options for stomal/peristomal skin: skin barrier ring in place beneath pouch. Output: serosanguinous drainage with scant amount of green stool.  No gas.  Ostomy pouching: 1pc.with skin barrier ring.  Education provided: I instruct patient that I will return in the am to change his pouching sytem and to look at his skin to ensure that we are doing things the way they need to be done.  He nods understanding. Enrolled patient in [place] Start Discharge program: No. [place] nursing team will follow, and will remain available to this patient, the nursing, surgical and medical teams.   Thanks, Maudie Flakes, MSN, RN, [place], [place] Abbott  Pager# [phone]

## 2017-08-09 NOTE — Progress Notes (Signed)
PROGRESS NOTE  Jeff [name] GYI:[id number] [name]: 1961-05-29 [name]: 07/29/2017 PCP: [name], MD   LOS: 11 days   Brief Narrative / Interim history: 56 y.o.malewith medical history significant for metastatic colon cancer on chemotherapy, pancreatic abscess with percutaneous drain, iron deficiency anemia, and history of right leg DVT on Xarelto, now presenting to the emergency department for evaluation of 3 days of abdominal pain with nausea, vomiting, and anorexia. Patient reports that he was in his usual state until approximately 3 days ago when he noted the fairly sudden onset of pain in the mid abdomen with nausea. He went on to develop nonbloody nonbilious vomiting, had a couple loose stools, and has had loss of appetite. CT of the abdomen and pelvis at the time of admission revealed a bowel obstruction at the level of the descending colon at the location of the percutaneous drainage catheter. There was wall thickening secondary to colonic neoplasm. There was dilated fluid-filled appendix, similar to the dilated loops of colon and small bowel, compatible with dilatation due to the bowel obstruction.  Mild decrease in size of the multiloculated fluid collection in the splenic hilum and distal tail of the pancreas. GI, General Surgery and Med/Onc were consulted to assist in management. On 08/03/2017, there was an unsuccessful attempt to endoscopically stent colon.  Now surgical intervention is planned.  The patient was recently discharged after admission from 07/09/2017 through 07/14/2017 during which the patient was treated for sepsis secondary to pancreatic Abscess. The Patient Had a Percutaneous Drain Placed by IR on 07/11/2017, and He Was Subsequently [facility] with Amox/Clav. IR tube changed on 07/19/17.  Assessment & Plan: Principal Problem:   Bowel obstruction (HCC) Active Problems:   Primary colon cancer with metastasis to other site ([name])   Anemia, iron deficiency   History of deep  vein thrombosis (DVT) of lower extremity   Intra-abdominal abscess    Pancreatic abscess   Malignant neoplasm of splenic flexure (HCC)   Malnutrition of moderate degree   Severe malnutrition (HCC)   Colon obstruction (HCC)   Colonic obstruction -likely related in the setting of colon cancer, his obstruction was at the level of splenic flexure -Gastroenterology was consulted, and [name] attempted a stent placement on 8/7 which was unsuccessful -Given this, general surgery took patient to the operating room on 8/8 and patient underwent a transverse loop colostomy, bypassing the metastatic left colon cancer altogether -Postop recovery per general surgery, still with significant NG tube output  Metastatic colon adenocarcinoma -diagnosed 01/12/17, on palliative chemotherapy FOLFOX and panitumumab, last cycle on 07/06/2017.  He is seeing [name], consulted.  He has metastatic disease to the pancreas, spleen as well as peritoneal metastasis.  Overall very poor prognosis, further treatment as an outpatient  Pancreatic abscess -perc drain changed 07/19/17--management per surgery/IR, will likely need to be followed up in drain clinic as an outpatient.  Appreciate interventional radiology. Off antibiotics now.  Cultures from 07/11/17 showed Bacteroides Ovatus.  The drain to be evaluated by radiology today  DVT in right lower extremity -diagnosed 02/17/17, on home rivaroxaban, now on heparin infusion periprocedural.  Resume Xarelto per surgery when OK to take in po  Anemia and pancytopenia secondary to chemotherapy - blood loss anemia contributing some, patient has been having small amounts of bright red blood in his ostomy bag, hemoglobin slight downtrending, 9.7 >> 8.6 >> 8.7  -Continue heparin  Severe malnutrition -appreciate nutrition eval, start supplements when able to tolerate po, for now continue TPN  Hypokalemia -replete, -check mag--1.6 today. Replete  Goals of Care -remains full  code   DVT prophylaxis: Heparin infusion Code Status: Full code Family Communication: No family at bedside Disposition Plan: Home versus SNF when ready, discharge readiness per general surgery postop  Consultants:   General surgery  Gastroenterology  Interventional radiology  Procedures:   None   Antimicrobials:  Zosyn 8/2 >> 8/10  Subjective: - no chest pain, shortness of breath, + abdominal pain, no nausea or vomiting.   Objective: Vitals:   08/08/17 0555 08/08/17 1400 08/08/17 2120 08/09/17 0652  BP: 129/79 (!) 130/91 118/79 124/87  Pulse: (!) 108 98 98 (!) 105  Resp: 18 18 18 18   Temp: 98.4 F (36.9 C) 98.6 F (37 C) 98.2 F (36.8 C) 99.1 F (37.3 C)  TempSrc: Oral Oral Oral Oral  SpO2: 98% 100% 100% 100%  Weight:      Height:        Intake/Output Summary (Last 24 hours) at 08/09/17 1224 Last data filed at 08/09/17 1006  Gross per 24 hour  Intake           2952.8 ml  Output             3785 ml  Net           -832.2 ml   Filed Weights   08/02/17 0533 08/03/17 0500 08/06/17 0500  Weight: 55.6 kg (122 lb 9.2 oz) 59.2 kg (130 lb 8.2 oz) 64.4 kg (141 lb 15.6 oz)    Examination:  Vitals:   08/08/17 0555 08/08/17 1400 08/08/17 2120 08/09/17 0652  BP: 129/79 (!) 130/91 118/79 124/87  Pulse: (!) 108 98 98 (!) 105  Resp: 18 18 18 18   Temp: 98.4 F (36.9 C) 98.6 F (37 C) 98.2 F (36.8 C) 99.1 F (37.3 C)  TempSrc: Oral Oral Oral Oral  SpO2: 98% 100% 100% 100%  Weight:      Height:        Constitutional: NAD Respiratory: CTA Cardiovascular: RRR   Data Reviewed: I have independently reviewed following labs and imaging studies   CBC:  Recent Labs Lab 08/04/17 0741 08/05/17 0430 08/06/17 0539 08/07/17 0550 08/08/17 0601 08/09/17 0523  WBC 8.1 11.1* 11.5* 9.2 8.3 7.4  NEUTROABS 5.5  --   --   --   --  5.2  HGB 9.3* 9.2* 9.7* 8.6* 8.7* 8.4*  HCT 28.8* 27.9* 29.0* 25.5* 26.0* 24.8*  MCV 85.0 83.8 84.5 82.8 84.1 84.6  PLT 104* 115*  111* 102* 99* 264*   Basic Metabolic Panel:  Recent Labs Lab 08/04/17 0741 08/05/17 0430 08/06/17 0539 08/07/17 0550 08/08/17 0601 08/09/17 0523  NA 140 135 129* 130* 133* 132*  K 3.6 4.3 4.1 4.3 4.1 4.5  CL 109 104 100* 98* 98* 101  CO2 25 25 22 23 26 25   GLUCOSE 127* 144* 124* 123* 130* 133*  BUN 5* 12 14 14 14 13   CREATININE 0.81 0.75 0.72 0.60* 0.55* 0.66  CALCIUM 8.8* 8.4* 8.4* 8.3* 8.5* 8.6*  MG 1.7 1.5* 1.6* 1.6* 1.8 1.7  PHOS 4.0 3.6 3.3 3.7  --  4.7*   GFR: Estimated Creatinine Clearance: 91.7 mL/min (by C-G formula based on SCr of 0.66 mg/dL). Liver Function Tests:  Recent Labs Lab 08/04/17 0741 08/05/17 0430 08/06/17 0539 08/09/17 0523  AST 15 18 20 19   ALT 6* 7* 7* 7*  ALKPHOS 40 36* 45 64  BILITOT 0.4 0.4 0.2* 0.4  PROT 7.4 6.3* 6.6 7.1  ALBUMIN 3.0* 2.6* 2.6* 2.6*   No results for input(s): LIPASE, AMYLASE in the last 168 hours. No results for input(s): AMMONIA in the last 168 hours. Coagulation Profile: No results for input(s): INR, PROTIME in the last 168 hours. Cardiac Enzymes: No results for input(s): CKTOTAL, CKMB, CKMBINDEX, TROPONINI in the last 168 hours. BNP (last 3 results) No results for input(s): PROBNP in the last 8760 hours. HbA1C: No results for input(s): HGBA1C in the last 72 hours. CBG:  Recent Labs Lab 08/06/17 0553 08/06/17 1312 08/06/17 1802 08/06/17 2331 08/07/17 0535  GLUCAP 137* 117* 107* 129* 124*   Lipid Profile:  Recent Labs  08/09/17 0523  TRIG 83   Thyroid Function Tests: No results for input(s): TSH, T4TOTAL, FREET4, T3FREE, THYROIDAB in the last 72 hours. Anemia Panel: No results for input(s): VITAMINB12, FOLATE, FERRITIN, TIBC, IRON, RETICCTPCT in the last 72 hours. Urine analysis:    Component Value Date/Time   COLORURINE YELLOW 07/10/2017 0045   APPEARANCEUR CLEAR 07/10/2017 0045   LABSPEC 1.009 07/10/2017 0045   PHURINE 5.0 07/10/2017 0045   GLUCOSEU NEGATIVE 07/10/2017 0045   HGBUR  MODERATE (A) 07/10/2017 0045   BILIRUBINUR NEGATIVE 07/10/2017 0045   KETONESUR NEGATIVE 07/10/2017 0045   PROTEINUR NEGATIVE 07/10/2017 0045   NITRITE NEGATIVE 07/10/2017 0045   LEUKOCYTESUR NEGATIVE 07/10/2017 0045   Sepsis Labs: Invalid input(s): PROCALCITONIN, LACTICIDVEN  No results found for this or any previous visit (from the past 240 hour(s)).    Radiology Studies: Dg Chest Port 1 View  Result Date: 08/08/2017 CLINICAL DATA:  Cough today EXAM: PORTABLE CHEST 1 VIEW COMPARISON:  08/06/2017 FINDINGS: Stable right jugular Port-A-Cath. Normal heart size. Bibasilar atelectasis. No pneumothorax. NG tube tip is in the proximal duodenum. IMPRESSION: Bibasilar atelectasis. NG tube tip in the proximal duodenum.  This should be retracted. Electronically Signed   By: Marybelle Killings M.D.   On: 08/08/2017 10:57   Scheduled Meds: . Chlorhexidine Gluconate Cloth  6 each Topical Q0600  . pantoprazole (PROTONIX) IV  40 mg Intravenous Q12H  . sodium chloride flush  10-40 mL Intracatheter Q12H   Continuous Infusions: . dextrose 5 % and 0.9 % NaCl with KCl 20 mEq/L 65 mL/hr at 08/08/17 2015  . Marland KitchenTPN (CLINIMIX-E) Adult     And  . fat emulsion    . heparin 1,500 Units/hr (08/09/17 0630)  . magnesium sulfate 1 - 4 g bolus IVPB    . Marland KitchenTPN (CLINIMIX-E) Adult 75 mL/hr at 08/08/17 [place], MD, PhD [facility] Pager [phone]  If 7PM-7AM, please contact night-coverage [email or web address] Password [facility] 08/09/2017, 12:24 PM

## 2017-08-09 NOTE — Progress Notes (Signed)
ANTICOAGULATION CONSULT NOTE -   Pharmacy Consult for Heparin Indication: hx DVT- bridge while off Xarelto  No Known Allergies  Patient Measurements: Height: 5\' 9"  (175.3 cm) Weight: 141 lb 15.6 oz (64.4 kg) IBW/kg (Calculated) : 70.7 Heparin Dosing Weight: 56.9 kg (act BW)  Vital Signs: Temp: 99.1 F (37.3 C) (08/13 0652) Temp Source: Oral (08/13 0652) BP: 124/87 (08/13 0652) Pulse Rate: 105 (08/13 0652)  Labs:  Recent Labs  08/07/17 0550 08/08/17 0601 08/08/17 1835 08/09/17 0523 08/09/17 1258  HGB 8.6* 8.7*  --  8.4*  --   HCT 25.5* 26.0*  --  24.8*  --   PLT 102* 99*  --  100*  --   HEPARINUNFRC 0.33  --  0.10* 0.22* 0.24*  CREATININE 0.60* 0.55*  --  0.66  --    Estimated Creatinine Clearance: 91.7 mL/min (by C-G formula based on SCr of 0.66 mg/dL).  Assessment: 56 y.o M on Xarelto for hx DVT was admitted with bowel obstruction secondary to metastatic colon cancer. Orders were received to switch anticoagulation to IV heparin during hospitalization in anticipation of surgery or other invasive procedures.  Significant events:  8/7: Sigmoidoscopy- Malignant completely obstructing tumor in the descending colon. 8/8: diverting colostomy -- heparin drip turned off at 0730 for procedure; resumed 6 hrs post-op 8/12: Heparin discontinued at 05 am 2nd to large clots of blood mixed in with stool in colostomy bag. D/w Dr Zella Richer - restart heparin with no bolus at a lower rate.  8/13: still with blood noted in colostomy bag   Today, 08/09/2017: - heparin level remain sub-therapeutic at 0.24 with rate increased to 1500 units/hr this morning - hgb trending down, plts relatively stable - blood still noted in colostomy bag; Spoke to Dr. Hunt Oris-- suspects old blood in bag, recom to cont with heparin drip for now   Goal of Therapy:  Heparin level adjusted to 0.3 - 0.5 on 8/12 2nd bleeding Monitor platelets by anticoagulation protocol: Yes   Plan: - Increase heparin to  1600 units/hr  -  recheck 8 hr HL - monitor cbc closely and s/s of active bleeding   Lynelle Doctor 08/09/2017 1:47 PM

## 2017-08-09 NOTE — Progress Notes (Signed)
PHARMACY - ADULT TOTAL PARENTERAL NUTRITION CONSULT NOTE   Pharmacy Consult for TPN Indication: SBO  Patient Measurements: Height: 5\' 9"  (175.3 cm) Weight: 141 lb 15.6 oz (64.4 kg) IBW/kg (Calculated) : 70.7 TPN AdjBW (KG): 55.3 Body mass index is 20.97 kg/m.   Insulin Requirements:  DC'd  SSI/CBGs 8/11  Current Nutrition: NPO  IVF: D5NS + KCl 20 mEq/L running at 65 mL/hr  Central access: PICC TPN start date: 8/7  ASSESSMENT                                                                                                          HPI:  56 y.o.malewith medical hx significant for metastatic colon cancer, on chemo, pancreatic abscess with percutaneous drain, iron deficiency anemia, and hx of right leg DVT, on Xarelto.  Presented to the ED for evaluation of 3 days of abdominal pain with nausea, vomiting, and anorexia. CT of the abdomen and pelvis at the time of admission revealed a bowel obstruction at the level of the descending colon at the location of the percutaneous drainage catheter.  Significant events:  8/7: Sigmoidoscopy- Malignant completely obstructing tumor in the                                    descending colon. 8/8: Transverse Loop Colostomy   Recent Labs  08/07/17 0550 08/08/17 0601 08/09/17 0523  NA 130* 133* 132*  K 4.3 4.1 4.5  CL 98* 98* 101  CO2 23 26 25   GLUCOSE 123* 130* 133*  BUN 14 14 13   CREATININE 0.60* 0.55* 0.66  CALCIUM 8.3* 8.5* 8.6*  PHOS 3.7  --  4.7*  MG 1.6* 1.8 1.7  ALBUMIN  --   --  2.6*  ALKPHOS  --   --  64  AST  --   --  19  ALT  --   --  7*  BILITOT  --   --  0.4  TRIG  --   --  83  PREALBUMIN  --   --  5.6*  Corr Ca 9.5   Today:   Glucose (goal <150): no hx DM;  SSI and CBGs stopped on 8/11  Electrolytes:  Mg 1.7; Na slightly low at 132  Renal: SCr WNL, stable  LFTs: ALT low  TGs: 445 (8/8), 29 (8/9), 83 (8/13)  Prealbumin - 6.4 (8/4), 9.8 (8/8), 5.6 (8/13)  I/O (-) 372 ml ;  NG output 1800 mL/24 hr,  colostomy 775 ml   NUTRITIONAL GOALS  RD recs (8/10): Kcal:  1880-2050 (33-36 kcal/kg) Protein:  85-97 grams (1.5-1.7 grams/kg)  Clinimix E 5/20 at a goal rate of 80ml/hr + 20% fat emulsion at 51ml/hr x 12 hours  to provide: 90 g/day protein, 2064 Kcal/day.  PLAN                                                                                                                         Now:  Magnesium sulfate 2 gm IV x1  At 1800 today:  Continue Clinimix E 5/20 at goal rate of 75 mL/hr  Continue 20% fat emulsion at 40ml/hr x 12 hours daily  TPN to contain standard multivitamins and trace elements.  continue mIVF at 65 mL/hr or per MD  TPN lab panels on Mondays & Thursdays.  Dia Sitter, PharmD, BCPS 08/09/2017 10:17 AM

## 2017-08-09 NOTE — Progress Notes (Signed)
[facility]/Trauma Progress Note  5 Days Post-Op   Assessment/Plan SBO due to metastatic adenocarcinoma of colon- S/P Transverse loop colostomy 08/04/17 Dr. [name] -postop ileus, scant green liquid stool in bag this AM with blood, no gas in bag - high bilious ng output   Adenocarcinoma of the colon w/ mets to spleen/pancreas - on Chemotherapy 07/08/17  abscess of the pancreatic tail- S/p perc drain; MULTIPLE ORGANISMS PRESENT, NONE PREDOMINANT, BACTEROIDES OVATUS , BETA LACTAMASE NEGATIVE on 07/11/17; Pt noted to be incurable and not seen by Dr. Barry Dienes as previously planned. Sepsis  DVT RLE was on xarelto, now on heparin Severe PCM- prealbumin 6.4 on 08/01/17; on TPN.  FEN: TPN, NPO, NGT VTE: SCD's, heparin ID: no abx currently Follow up: TBD  Plan:  Wait for consistent bowel function with decreased ng output. Encourage ambulation    LOS: 11 days    Subjective:  CC: abdominal pain  Pt states pain in RLQ. No vomiting. High bilious NGT output (1813mL in last 24 hrs). No acute events overnight.   Objective: Vital signs in last 24 hours: Temp:  [98.2 F (36.8 C)-99.1 F (37.3 C)] 99.1 F (37.3 C) (08/13 0652) Pulse Rate:  [98-105] 105 (08/13 0652) Resp:  [18] 18 (08/13 0652) BP: (118-130)/(79-91) 124/87 (08/13 0652) SpO2:  [100 %] 100 % (08/13 0652) Last BM Date: 12/08/17  Intake/Output from previous day: 08/12 0701 - 08/13 0700 In: 3512.8 [I.V.:3512.8] Out: 3885 [Urine:1300; Emesis/NG output:1800; Drains:10; Stool:775] Intake/Output this shift: No intake/output data recorded.  PE: Gen:  Alert, NAD, pleasant, cooperative Card:  RRR, no M/G/R heard Pulm:  Rate and effort normal Abd: NGT in place, Soft, not distended, absent BS, incisions C/D/I, stoma purple, bar in place. Bloody liquidly output in bag mixed with small amount of green liquid stool. TTP in RLQ without guarding  Skin: no rashes noted, warm and  dry   Anti-infectives: Anti-infectives    Start     Dose/Rate Route Frequency Ordered Stop   08/04/17 2200  piperacillin-tazobactam (ZOSYN) IVPB 3.375 g     3.375 g 12.5 mL/hr over 240 Minutes Intravenous Every 8 hours 08/04/17 1706 08/05/17 2244   08/04/17 0951  cefoTEtan in Dextrose 5% (CEFOTAN) 2-2.08 GM-% IVPB    Comments:  Algis Liming   : cabinet override      08/04/17 0951 08/04/17 1102   08/04/17 0945  cefoTEtan in Dextrose 5% (CEFOTAN) IVPB 2 g     2 g Intravenous  Once 08/04/17 0820 08/04/17 1117   08/04/17 0800  cefoTEtan (CEFOTAN) 2 g in dextrose 5 % 50 mL IVPB  Status:  Discontinued     2 g 100 mL/hr over 30 Minutes Intravenous On call to O.R. 08/04/17 0746 08/04/17 0819   07/29/17 2359  piperacillin-tazobactam (ZOSYN) IVPB 3.375 g  Status:  Discontinued     3.375 g 12.5 mL/hr over 240 Minutes Intravenous Every 8 hours 07/29/17 1729 08/04/17 1706   07/29/17 1730  piperacillin-tazobactam (ZOSYN) IVPB 3.375 g     3.375 g 100 mL/hr over 30 Minutes Intravenous  Once 07/29/17 1728 07/29/17 1853      Lab Results:   Recent Labs  08/08/17 0601 08/09/17 0523  WBC 8.3 7.4  HGB 8.7* 8.4*  HCT 26.0* 24.8*  PLT 99* 100*   BMET  Recent Labs  08/08/17 0601 08/09/17 0523  NA 133* 132*  K 4.1 4.5  CL 98* 101  CO2 26 25  GLUCOSE 130* 133*  BUN 14 13  CREATININE  0.55* 0.66  CALCIUM 8.5* 8.6*   PT/INR No results for input(s): LABPROT, INR in the last 72 hours. CMP     Component Value Date/Time   NA 132 (L) 08/09/2017 0523   NA 135 (L) 07/26/2017 1014   K 4.5 08/09/2017 0523   K 3.3 (L) 07/26/2017 1014   CL 101 08/09/2017 0523   CO2 25 08/09/2017 0523   CO2 25 07/26/2017 1014   GLUCOSE 133 (H) 08/09/2017 0523   GLUCOSE 129 07/26/2017 1014   BUN 13 08/09/2017 0523   BUN 9.8 07/26/2017 1014   CREATININE 0.66 08/09/2017 0523   CREATININE 1.0 07/26/2017 1014   CALCIUM 8.6 (L) 08/09/2017 0523   CALCIUM 9.3 07/26/2017 1014   PROT 7.1 08/09/2017 0523   PROT  8.2 07/26/2017 1014   ALBUMIN 2.6 (L) 08/09/2017 0523   ALBUMIN 3.3 (L) 07/26/2017 1014   AST 19 08/09/2017 0523   AST 24 07/26/2017 1014   ALT 7 (L) 08/09/2017 0523   ALT 6 07/26/2017 1014   ALKPHOS 64 08/09/2017 0523   ALKPHOS 87 07/26/2017 1014   BILITOT 0.4 08/09/2017 0523   BILITOT 0.23 07/26/2017 1014   GFRNONAA >60 08/09/2017 0523   GFRAA >60 08/09/2017 0523   Lipase     Component Value Date/Time   LIPASE 14 07/29/2017 1122    Studies/Results: Dg Chest Port 1 View  Result Date: 08/08/2017 CLINICAL DATA:  Cough today EXAM: PORTABLE CHEST 1 VIEW COMPARISON:  08/06/2017 FINDINGS: Stable right jugular Port-A-Cath. Normal heart size. Bibasilar atelectasis. No pneumothorax. NG tube tip is in the proximal duodenum. IMPRESSION: Bibasilar atelectasis. NG tube tip in the proximal duodenum.  This should be retracted. Electronically Signed   By: Marybelle Killings M.D.   On: 08/08/2017 10:57      Kalman Drape , [facility] 08/09/2017, 8:41 AM Pager: [phone] Consults: [phone] Mon-Fri 7:00 am-4:30 pm Sat-Sun 7:00 am-11:30 am

## 2017-08-10 ENCOUNTER — Telehealth: Payer: Self-pay

## 2017-08-10 LAB — COMPREHENSIVE METABOLIC PANEL
ALBUMIN: 2.7 g/dL — AB (ref 3.5–5.0)
ALT: 13 U/L — AB (ref 17–63)
AST: 30 U/L (ref 15–41)
Alkaline Phosphatase: 98 U/L (ref 38–126)
Anion gap: 9 (ref 5–15)
BUN: 13 mg/dL (ref 6–20)
CHLORIDE: 100 mmol/L — AB (ref 101–111)
CO2: 24 mmol/L (ref 22–32)
CREATININE: 0.64 mg/dL (ref 0.61–1.24)
Calcium: 9.1 mg/dL (ref 8.9–10.3)
GFR calc Af Amer: >60 mL/min (ref >60)
GLUCOSE: 125 mg/dL — AB (ref 65–99)
POTASSIUM: 4.7 mmol/L (ref 3.5–5.1)
SODIUM: 133 mmol/L — AB (ref 135–145)
Total Bilirubin: 0.3 mg/dL (ref 0.3–1.2)
Total Protein: 7.5 g/dL (ref 6.5–8.1)

## 2017-08-10 LAB — CBC
HCT: 25.5 % — ABNORMAL LOW (ref 39.0–52.0)
HEMOGLOBIN: 8.3 g/dL — AB (ref 13.0–17.0)
MCH: 27.7 pg (ref 26.0–34.0)
MCHC: 32.5 g/dL (ref 30.0–36.0)
MCV: 85 fL (ref 78.0–100.0)
Platelets: 116 10*3/uL — ABNORMAL LOW (ref 150–400)
RBC: 3 MIL/uL — AB (ref 4.22–5.81)
RDW: 14.6 % (ref 11.5–15.5)
WBC: 7.3 10*3/uL (ref 4.0–10.5)

## 2017-08-10 LAB — HEPARIN LEVEL (UNFRACTIONATED)
HEPARIN UNFRACTIONATED: 0.62 [IU]/mL (ref 0.30–0.70)
Heparin Unfractionated: 0.48 IU/mL (ref 0.30–0.70)

## 2017-08-10 LAB — MAGNESIUM: Magnesium: 1.7 mg/dL (ref 1.7–2.4)

## 2017-08-10 MED ORDER — TRACE MINERALS CR-CU-MN-SE-ZN 10-1000-500-60 MCG/ML IV SOLN
INTRAVENOUS | Status: AC
  Administered 2017-08-10: 17:00:00 via INTRAVENOUS
  Filled 2017-08-10: qty 1800

## 2017-08-10 MED ORDER — MAGNESIUM SULFATE 2 GM/50ML IV SOLN
2.0000 g | Freq: Once | INTRAVENOUS | Status: AC
Start: 2017-08-10 — End: 2017-08-10
  Administered 2017-08-10: 2 g via INTRAVENOUS
  Filled 2017-08-10: qty 50

## 2017-08-10 MED ORDER — FAT EMULSION 20 % IV EMUL
240.0000 mL | INTRAVENOUS | Status: AC
  Administered 2017-08-10: 240 mL via INTRAVENOUS
  Filled 2017-08-10: qty 250

## 2017-08-10 MED ORDER — HEPARIN (PORCINE) IN NACL 100-0.45 UNIT/ML-% IJ SOLN
1500.0000 [IU]/h | INTRAMUSCULAR | Status: DC
  Administered 2017-08-10: 1500 [IU]/h via INTRAVENOUS
  Filled 2017-08-10 (×2): qty 250

## 2017-08-10 NOTE — Progress Notes (Signed)
Brief Pharmacy Note re: IV heparin  For complete details see note from earlier today from Karie Kirks, PharmD.  O: Heparin level 0.48 on 1500 units/hr  A/P: Heparin level therapeutic.  Continue current rate & f/u morning labs.   Netta Cedars, PharmD, BCPS 08/10/2017@6 :43 PM

## 2017-08-10 NOTE — Progress Notes (Signed)
ANTICOAGULATION CONSULT NOTE -   Pharmacy Consult for Heparin Indication: hx DVT- bridge while off Xarelto  No Known Allergies  Patient Measurements: Height: 5\' 9"  (175.3 cm) Weight: 141 lb 15.6 oz (64.4 kg) IBW/kg (Calculated) : 70.7 Heparin Dosing Weight: 56.9 kg (act BW)  Vital Signs: Temp: 98.6 F (37 C) (08/14 0633) Temp Source: Oral (08/14 3976) BP: 144/91 (08/14 7341) Pulse Rate: 109 (08/14 0633)  Labs:  Recent Labs  08/08/17 0601  08/09/17 0523 08/09/17 1258 08/09/17 2239 08/10/17 0531  HGB 8.7*  --  8.4*  --   --  8.3*  HCT 26.0*  --  24.8*  --   --  25.5*  PLT 99*  --  100*  --   --  116*  HEPARINUNFRC  --   < > 0.22* 0.24* 0.53 0.62  CREATININE 0.55*  --  0.66  --   --  0.64  < > = values in this interval not displayed. Estimated Creatinine Clearance: 91.7 mL/min (by C-G formula based on SCr of 0.64 mg/dL).  Assessment: 56 y.o M on Xarelto for hx DVT was admitted with bowel obstruction secondary to metastatic colon cancer. Orders were received to switch anticoagulation to IV heparin during hospitalization in anticipation of surgery or other invasive procedures.  Significant events:  8/7: Sigmoidoscopy- Malignant completely obstructing tumor in the descending colon. 8/8: diverting colostomy -- heparin drip turned off at 0730 for procedure; resumed 6 hrs post-op 8/12: Heparin discontinued at 05 am 2nd to large clots of blood mixed in with stool in colostomy bag. D/w Dr Zella Richer - restart heparin with no bolus at a lower rate.  8/13: still with blood noted in colostomy bag; Spoke to Dr. Hunt Oris-- suspects old blood in bag, recom to cont with heparin drip for now   Today, 08/10/2017: - heparin level trending up to 0.62 with current rate of 1600 units/hr.  - hgb down slightly to 8.3, plts relative stable - per RN, some clotted blood noted in colostomy bag  Goal of Therapy:  Heparin level adjusted to 0.3 - 0.5 on 8/12 2nd bleeding Monitor platelets by  anticoagulation protocol: Yes   Plan: - Decrease heparin to 1500 units/hr  -  Recheck 8 hr HL - monitor cbc closely and s/s of active bleeding   Lynelle Doctor 08/10/2017 7:06 AM

## 2017-08-10 NOTE — Progress Notes (Signed)
ANTICOAGULATION CONSULT NOTE - Follow Up Consult  Pharmacy Consult for Heparin Indication: hx DVT- bridge while off Xarelto  No Known Allergies  Patient Measurements: Height: 5\' 9"  (175.3 cm) Weight: 141 lb 15.6 oz (64.4 kg) IBW/kg (Calculated) : 70.7 Heparin Dosing Weight:   Vital Signs: Temp: 98.6 F (37 C) (08/13 2206) Temp Source: Oral (08/13 2206) BP: 123/83 (08/13 2206) Pulse Rate: 102 (08/13 2206)  Labs:  Recent Labs  08/07/17 0550 08/08/17 0601  08/09/17 0523 08/09/17 1258 08/09/17 2239  HGB 8.6* 8.7*  --  8.4*  --   --   HCT 25.5* 26.0*  --  24.8*  --   --   PLT 102* 99*  --  100*  --   --   HEPARINUNFRC 0.33  --   < > 0.22* 0.24* 0.53  CREATININE 0.60* 0.55*  --  0.66  --   --   < > = values in this interval not displayed.  Estimated Creatinine Clearance: 91.7 mL/min (by C-G formula based on SCr of 0.66 mg/dL).   Medications:  Infusions:  . dextrose 5 % and 0.9 % NaCl with KCl 20 mEq/L 65 mL/hr at 08/10/17 0451  . Marland KitchenTPN (CLINIMIX-E) Adult 75 mL/hr at 08/09/17 1808   And  . fat emulsion 240 mL (08/09/17 1808)  . heparin 1,600 Units/hr (08/09/17 2110)    Assessment: Patient with heparin level just above reduced goal.  No heparin issues per RN.  No bright red blood noted.  While level is above goal, level was below goal at 1500 units/hr, if level rises further would reduce rate.  Hope that level moves back to within goal range.  Goal of Therapy:  Heparin level adjusted to 0.3 - 0.5 on 8/12 2nd bleeding Monitor platelets by anticoagulation protocol: Yes   Plan:  Continue heparin drip at current rate Recheck level with AM labs  [name] 08/10/2017,5:48 AM

## 2017-08-10 NOTE — Progress Notes (Signed)
Order was to pull ng tube once pt ate some supper, however, the pt would not eat all day.  I explained to him again that if he could try to eat, the tube could be removed.  He stated he may try to eat later.  The tube was not removed due to pt not eating.  Attempted to flush JP drain but was unsuccessful.  Will continue to monitor.

## 2017-08-10 NOTE — Telephone Encounter (Signed)
[facility] nurse called to check on client ,states he is feeling low , ask what his pain was on a scale of 0-10 he states very high. Nurse did not hold client on the phone long will visit tomorrow and he said okay .  Chart review done on client , update on client sent to [facility] administrative team requesting extension in program until known progress and plan of care is developed for client for discharge.

## 2017-08-10 NOTE — Congregational Nurse Program (Signed)
Congregational Nurse Program Note  Date of Encounter: 08/05/2017  Past Medical History: Past Medical History:  Diagnosis Date  . Acute deep vein thrombosis (DVT) of right lower extremity ([place]) 02/18/2017  . Malignant neoplasm of abdomen ([place])    [name] 01/11/2017  . Microcytic anemia    [name] 01/11/2017    Encounter Details:     CNP Questionnaire - 08/10/17 1705      Patient Demographics   Is this a new or existing patient? Existing   Patient is considered a/an Not Applicable   Race African-American/Black     Patient Assistance   Location of Patient Assistance [facility] patient   Patient's financial/insurance status Medicaid   Uninsured Patient ([place] [employer]s) No   Patient referred to apply for the following financial assistance Not Applicable   Food insecurities addressed Not Applicable   Transportation assistance No   Assistance securing medications No   Educational health offerings Chronic disease;Cancer;Nutrition;Safety     Encounter Details   Primary purpose of visit Chronic Illness/Condition Visit;Education/Health Concerns;Spiritual Care/Support Visit   Was an Emergency Department visit averted? Not Applicable   Does patient have a medical provider? Yes   Patient referred to Follow up with established PCP   Was a mental health screening completed? (GAINS tool) No   Does patient have dental issues? No   Does patient have vision issues? No   Does your patient have an abnormal blood pressure today? No   Since previous encounter, have you referred patient for abnormal blood pressure that resulted in a new diagnosis or medication change? No   Does your patient have an abnormal blood glucose today? No   Since previous encounter, have you referred patient for abnormal blood glucose that resulted in a new diagnosis or medication change? No   Was there a life-saving intervention made? No     [facility] nurse in to see client post op . Ostomy nurse in with client ,nurse  waited outside room, sister in with client ,N/G tube draining, client alert ,able to talk, sitting upright in bed,  talked about pain he went through and that he would be in the hospital for a few days . 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 Client has had two recent hospitalizations and it has taken a toll on him . NPO due to N/G but client has lost a lot of weight. Discussed briefly that he had  Had major surgery but did well tolerating all procedures . Informed client that I would alert [facility] team to what was going on with him and keep a check on his condition. Will discuss plans for client maybe on next visit  As today is his first day post op Introduced self to family member .  Visit in the next few  Days and will alert SW on [facility] team.

## 2017-08-10 NOTE — Progress Notes (Signed)
[facility]/Trauma Progress Note  6 Days Post-Op   Assessment/Plan SBO due to metastatic adenocarcinoma of colon- S/P Transverse loop colostomy 08/04/17 Dr. [name] - postop ileus appears to be resolving, stool in bag and very little NGT output   Adenocarcinoma of the colon w/ mets to spleen/pancreas - on Chemotherapy 07/08/17  abscess of the pancreatic tail- S/p perc drain; MULTIPLE ORGANISMS PRESENT, NONE PREDOMINANT, BACTEROIDES OVATUS , BETA LACTAMASE NEGATIVE on 07/11/17; Pt noted to be incurable and not seen by Dr. Barry Dienes as previously planned. - IR planned to do drain study 08/13, no procedure note?? Done?? Sepsis  DVT RLE was on xarelto, now on heparin Severe PCM- prealbumin 6.4 on 08/01/17; on TPN.  FEN: TPN, NGT clamped, sips from the floor VTE: SCD's, heparin ID: no abx currently Follow up: TBD  Plan: Clamp NGT and allow sips from the floor. It pt does well today with clamping then can advance to clears. Would go slowly and only pull tube after pt tolerates a clear liquid diet. Encourage ambulation   LOS: 12 days    Subjective:  CC: abdominal pain  No nausea. No acute events overnight. NGT output 100cc in last 24hrs.   Objective: Vital signs in last 24 hours: Temp:  [98.6 F (37 C)] 98.6 F (37 C) (08/14 4097) Pulse Rate:  [98-109] 109 (08/14 0633) Resp:  [17-18] 18 (08/14 0633) BP: (118-144)/(74-91) 144/91 (08/14 0633) SpO2:  [100 %] 100 % (08/14 0633) Last BM Date: 12/08/17  Intake/Output from previous day: 08/13 0701 - 08/14 0700 In: 2898 [I.V.:2848; IV Piggyback:50] Out: 3560 [Urine:2625; Emesis/NG output:100; Stool:835] Intake/Output this shift: No intake/output data recorded.  PE: Gen:  Alert, NAD, pleasant, cooperative Pulm:  Rate and effort normal Abd: NGT in place, Soft, not distended, + BS, incision appears well healing with small amt of bleeding and no signs of infection, stoma purple, bar removed by [place] nurse. Dark green  liquid stool with small amount of blood. LUQ drain with scant serosanguinous drainage  Skin: no rashes noted, warm and dry      Anti-infectives: Anti-infectives    Start     Dose/Rate Route Frequency Ordered Stop   08/04/17 2200  piperacillin-tazobactam (ZOSYN) IVPB 3.375 g     3.375 g 12.5 mL/hr over 240 Minutes Intravenous Every 8 hours 08/04/17 1706 08/05/17 2244   08/04/17 0951  cefoTEtan in Dextrose 5% (CEFOTAN) 2-2.08 GM-% IVPB    Comments:  Algis Liming   : cabinet override      08/04/17 0951 08/04/17 1102   08/04/17 0945  cefoTEtan in Dextrose 5% (CEFOTAN) IVPB 2 g     2 g Intravenous  Once 08/04/17 0820 08/04/17 1117   08/04/17 0800  cefoTEtan (CEFOTAN) 2 g in dextrose 5 % 50 mL IVPB  Status:  Discontinued     2 g 100 mL/hr over 30 Minutes Intravenous On call to O.R. 08/04/17 0746 08/04/17 0819   07/29/17 2359  piperacillin-tazobactam (ZOSYN) IVPB 3.375 g  Status:  Discontinued     3.375 g 12.5 mL/hr over 240 Minutes Intravenous Every 8 hours 07/29/17 1729 08/04/17 1706   07/29/17 1730  piperacillin-tazobactam (ZOSYN) IVPB 3.375 g     3.375 g 100 mL/hr over 30 Minutes Intravenous  Once 07/29/17 1728 07/29/17 1853      Lab Results:   Recent Labs  08/09/17 0523 08/10/17 0531  WBC 7.4 7.3  HGB 8.4* 8.3*  HCT 24.8* 25.5*  PLT 100* 116*   BMET  Recent Labs  08/09/17 0523 08/10/17 0531  NA 132* 133*  K 4.5 4.7  CL 101 100*  CO2 25 24  GLUCOSE 133* 125*  BUN 13 13  CREATININE 0.66 0.64  CALCIUM 8.6* 9.1   PT/INR No results for input(s): LABPROT, INR in the last 72 hours. CMP     Component Value Date/Time   NA 133 (L) 08/10/2017 0531   NA 135 (L) 07/26/2017 1014   K 4.7 08/10/2017 0531   K 3.3 (L) 07/26/2017 1014   CL 100 (L) 08/10/2017 0531   CO2 24 08/10/2017 0531   CO2 25 07/26/2017 1014   GLUCOSE 125 (H) 08/10/2017 0531   GLUCOSE 129 07/26/2017 1014   BUN 13 08/10/2017 0531   BUN 9.8 07/26/2017 1014   CREATININE 0.64 08/10/2017 0531    CREATININE 1.0 07/26/2017 1014   CALCIUM 9.1 08/10/2017 0531   CALCIUM 9.3 07/26/2017 1014   PROT 7.5 08/10/2017 0531   PROT 8.2 07/26/2017 1014   ALBUMIN 2.7 (L) 08/10/2017 0531   ALBUMIN 3.3 (L) 07/26/2017 1014   AST 30 08/10/2017 0531   AST 24 07/26/2017 1014   ALT 13 (L) 08/10/2017 0531   ALT 6 07/26/2017 1014   ALKPHOS 98 08/10/2017 0531   ALKPHOS 87 07/26/2017 1014   BILITOT 0.3 08/10/2017 0531   BILITOT 0.23 07/26/2017 1014   GFRNONAA >60 08/10/2017 0531   GFRAA >60 08/10/2017 0531   Lipase     Component Value Date/Time   LIPASE 14 07/29/2017 1122    Studies/Results: Ir Sinus/fist Tube Chk-non Gi  Result Date: 08/09/2017 CLINICAL DATA:  History of stage IV colon cancer complicated by a malignant perforation and development of a abscess within the tail of the pancreas, post CT-guided percutaneous drainage catheter placement on 07/11/2017 by Dr. [name]. Note, the percutaneous drainage catheter was subsequent exchanged on 07/19/2017. Patient has undergone interval diverting loop colostomy and request has in made for percutaneous drainage catheter injection. Given Dr. Dois Davenport high level of clinical concern for a pancreatic leak, there has been instructions NOT to exchange or remove the percutaneous drainage catheter at this time. EXAM: SINUS TRACT INJECTION/FISTULOGRAM COMPARISON:  CT abdomen pelvis - 07/29/2017; 07/10/2017; CT-guided percutaneous drainage catheter placement - 07/11/2017; fluoroscopic guided drainage catheter exchange -07/19/2017. CONTRAST:  15 cc Isovue 300 FLUOROSCOPY TIME:  1 minute (9 mGy) TECHNIQUE: The patient was positioned supine on the fluoroscopy table. A preprocedural spot fluoroscopic image was obtained of the left upper abdominal quadrant and existing percutaneous drainage catheter Multiple spot fluoroscopic and radiographic images were obtained following the injection of a small amount of contrast via the existing percutaneous drainage catheter. Images  were reviewed and the procedure was terminated. The catheter was flushed with a small amount of saline and reconnected to a JP bulb. FINDINGS: Contrast injection demonstrates opacification of the decompressed abscess cavity. There is reflux of contrast along the catheter tract to the entrance site at the skin surface. There is apparent communication between the decompressed abscess cavity and a serpiginous tubular structure within the left upper abdominal quadrant which while indeterminate may represent the pancreatic duct. IMPRESSION: Drainage catheter injection demonstrates the decompressed abscess cavity communicates with a serpiginous tubular structure within the left upper abdominal quadrant, indeterminate though potentially representative of the pancreatic duct. Correlation with amylase and lipase levels of the acquired fluid from the drainage catheter could be performed as clinically indicated. Electronically Signed   By: Sandi Mariscal M.D.   On: 08/09/2017 18:33   Dg Chest [facility]  Result Date: 08/08/2017 CLINICAL DATA:  Cough today EXAM: PORTABLE CHEST 1 VIEW COMPARISON:  08/06/2017 FINDINGS: Stable right jugular Port-A-Cath. Normal heart size. Bibasilar atelectasis. No pneumothorax. NG tube tip is in the proximal duodenum. IMPRESSION: Bibasilar atelectasis. NG tube tip in the proximal duodenum.  This should be retracted. Electronically Signed   By: Marybelle Killings M.D.   On: 08/08/2017 10:57      Kalman Drape , [facility] 08/10/2017, 9:27 AM Pager: [phone] Consults: [phone] Mon-Fri 7:00 am-4:30 pm Sat-Sun 7:00 am-11:30 am

## 2017-08-10 NOTE — Progress Notes (Signed)
PROGRESS NOTE  EMMAUS BRANDI IHK:[id number] [name]: 10/16/1961 [name]: 07/29/2017 PCP: [name], MD   LOS: 12 days   Brief Narrative / Interim history: 56 y.o.malewith medical history significant for metastatic colon cancer on chemotherapy, pancreatic abscess with percutaneous drain, iron deficiency anemia, and history of right leg DVT on Xarelto, now presenting to the emergency department for evaluation of 3 days of abdominal pain with nausea, vomiting, and anorexia. Patient reports that he was in his usual state until approximately 3 days ago when he noted the fairly sudden onset of pain in the mid abdomen with nausea. He went on to develop nonbloody nonbilious vomiting, had a couple loose stools, and has had loss of appetite. CT of the abdomen and pelvis at the time of admission revealed a bowel obstruction at the level of the descending colon at the location of the percutaneous drainage catheter. There was wall thickening secondary to colonic neoplasm. There was dilated fluid-filled appendix, similar to the dilated loops of colon and small bowel, compatible with dilatation due to the bowel obstruction.  Mild decrease in size of the multiloculated fluid collection in the splenic hilum and distal tail of the pancreas. GI, General Surgery and Med/Onc were consulted to assist in management. On 08/03/2017, there was an unsuccessful attempt to endoscopically stent colon.    General surgery took patient to the operating room on 8/8 for transverse colon colostomy to bypass to cancer a altogether.  Following the surgery, he has been having somewhat of a prolonged ileus.  His NG tube has had significant output up until 8/14 when output has been decreased.  NG tube is clamped on 8/14  Assessment & Plan: Principal Problem:   Bowel obstruction (HCC) Active Problems:   Primary colon cancer with metastasis to other site ([place])   Anemia, iron deficiency   History of deep vein thrombosis (DVT) of lower  extremity   Intra-abdominal abscess    Pancreatic abscess   Malignant neoplasm of splenic flexure (HCC)   Malnutrition of moderate degree   Severe malnutrition (HCC)   Colon obstruction (HCC)   Colonic obstruction -likely related in the setting of colon cancer, his obstruction was at the level of splenic flexure -Gastroenterology was consulted, and [name] attempted a stent placement on 8/7 which was unsuccessful -Given this, general surgery took patient to the operating room on 8/8 and patient underwent a transverse loop colostomy, bypassing the metastatic left colon cancer altogether -Postop recovery per general surgery, NG tube output has decreased over the last 24 hours, clamp the tube today per general surgery and allow sips  Metastatic colon adenocarcinoma -diagnosed 01/12/17, on palliative chemotherapy FOLFOX and panitumumab, last cycle on 07/06/2017.  He is seeing [name], consulted.  He has metastatic disease to the pancreas, spleen as well as peritoneal metastasis.  Overall very poor prognosis, further treatment as an outpatient -Patient told me on 8/13 that once #1 is improving and he is recovering, he wants to get further chemotherapy  Pancreatic abscess -perc drain changed 07/19/17--management per surgery/IR, will likely need to be followed up in drain clinic as an outpatient.  Appreciate interventional radiology. Off antibiotics now.  Cultures from 07/11/17 showed Bacteroides Ovatus.  -I have consulted  DVT in right lower extremity -diagnosed 02/17/17, on home rivaroxaban, now on heparin infusion. -Resume Xarelto per surgery when OK to take in po -His heparin was briefly held on 8/12 due to scant bleeding in his ostomy bag, however resumed shortly afterwards.  Hemoglobin has been down  trending but overall has been stable  Anemia and pancytopenia secondary to chemotherapy - blood loss anemia contributing some, patient has been having small amounts of bright red blood in his ostomy  bag, hemoglobin slight downtrending but overall stable -Continue heparin  Severe malnutrition -appreciate nutrition eval, start supplements when able to tolerate po, for now continue TPN  Hypokalemia -replete, -check mag--1.7 today.  Goals of Care -remains full code, and as above, patient wishes for ongoing chemotherapy   DVT prophylaxis: Heparin infusion Code Status: Full code Family Communication: No family at bedside Disposition Plan: Home versus SNF when ready, discharge readiness per general surgery postop  Consultants:   General surgery  Gastroenterology  Interventional radiology  Procedures:  transverse loop colostomy 8/8  Antimicrobials:  Zosyn 8/2 >> 8/10  Subjective: -Feels a little bit better, no abdominal pain, nausea or vomiting.  No shortness of breath.  No chest pain.  Objective: Vitals:   08/09/17 0652 08/09/17 1409 08/09/17 2206 08/10/17 0633  BP: 124/87 118/74 123/83 (!) 144/91  Pulse: (!) 105 98 (!) 102 (!) 109  Resp: 18 18 17 18   Temp: 99.1 F (37.3 C) 98.6 F (37 C) 98.6 F (37 C) 98.6 F (37 C)  TempSrc: Oral Tympanic Oral Oral  SpO2: 100% 100% 100% 100%  Weight:      Height:        Intake/Output Summary (Last 24 hours) at 08/10/17 1321 Last data filed at 08/10/17 0948  Gross per 24 hour  Intake             2338 ml  Output             3675 ml  Net            -1337 ml   Filed Weights   08/02/17 0533 08/03/17 0500 08/06/17 0500  Weight: 55.6 kg (122 lb 9.2 oz) 59.2 kg (130 lb 8.2 oz) 64.4 kg (141 lb 15.6 oz)    Examination:  Vitals:   08/09/17 0652 08/09/17 1409 08/09/17 2206 08/10/17 0633  BP: 124/87 118/74 123/83 (!) 144/91  Pulse: (!) 105 98 (!) 102 (!) 109  Resp: 18 18 17 18   Temp: 99.1 F (37.3 C) 98.6 F (37 C) 98.6 F (37 C) 98.6 F (37 C)  TempSrc: Oral Tympanic Oral Oral  SpO2: 100% 100% 100% 100%  Weight:      Height:        Constitutional: NAD Respiratory: CTA biL, no wheezing Cardiovascular:  RRR   Data Reviewed: I have independently reviewed following labs and imaging studies   CBC:  Recent Labs Lab 08/04/17 0741  08/06/17 0539 08/07/17 0550 08/08/17 0601 08/09/17 0523 08/10/17 0531  WBC 8.1  < > 11.5* 9.2 8.3 7.4 7.3  NEUTROABS 5.5  --   --   --   --  5.2  --   HGB 9.3*  < > 9.7* 8.6* 8.7* 8.4* 8.3*  HCT 28.8*  < > 29.0* 25.5* 26.0* 24.8* 25.5*  MCV 85.0  < > 84.5 82.8 84.1 84.6 85.0  PLT 104*  < > 111* 102* 99* 100* 116*  < > = values in this interval not displayed. Basic Metabolic Panel:  Recent Labs Lab 08/04/17 0741 08/05/17 0430 08/06/17 0539 08/07/17 0550 08/08/17 0601 08/09/17 0523 08/10/17 0531  NA 140 135 129* 130* 133* 132* 133*  K 3.6 4.3 4.1 4.3 4.1 4.5 4.7  CL 109 104 100* 98* 98* 101 100*  CO2 25 25 22 23 26  25  24  GLUCOSE 127* 144* 124* 123* 130* 133* 125*  BUN 5* 12 14 14 14 13 13   CREATININE 0.81 0.75 0.72 0.60* 0.55* 0.66 0.64  CALCIUM 8.8* 8.4* 8.4* 8.3* 8.5* 8.6* 9.1  MG 1.7 1.5* 1.6* 1.6* 1.8 1.7 1.7  PHOS 4.0 3.6 3.3 3.7  --  4.7*  --    GFR: Estimated Creatinine Clearance: 91.7 mL/min (by C-G formula based on SCr of 0.64 mg/dL). Liver Function Tests:  Recent Labs Lab 08/04/17 0741 08/05/17 0430 08/06/17 0539 08/09/17 0523 08/10/17 0531  AST 15 18 20 19 30   ALT 6* 7* 7* 7* 13*  ALKPHOS 40 36* 45 64 98  BILITOT 0.4 0.4 0.2* 0.4 0.3  PROT 7.4 6.3* 6.6 7.1 7.5  ALBUMIN 3.0* 2.6* 2.6* 2.6* 2.7*   No results for input(s): LIPASE, AMYLASE in the last 168 hours. No results for input(s): AMMONIA in the last 168 hours. Coagulation Profile: No results for input(s): INR, PROTIME in the last 168 hours. Cardiac Enzymes: No results for input(s): CKTOTAL, CKMB, CKMBINDEX, TROPONINI in the last 168 hours. BNP (last 3 results) No results for input(s): PROBNP in the last 8760 hours. HbA1C: No results for input(s): HGBA1C in the last 72 hours. CBG:  Recent Labs Lab 08/06/17 0553 08/06/17 1312 08/06/17 1802 08/06/17 2331  08/07/17 0535  GLUCAP 137* 117* 107* 129* 124*   Lipid Profile:  Recent Labs  08/09/17 0523  TRIG 83   Thyroid Function Tests: No results for input(s): TSH, T4TOTAL, FREET4, T3FREE, THYROIDAB in the last 72 hours. Anemia Panel: No results for input(s): VITAMINB12, FOLATE, FERRITIN, TIBC, IRON, RETICCTPCT in the last 72 hours. Urine analysis:    Component Value Date/Time   COLORURINE YELLOW 07/10/2017 0045   APPEARANCEUR CLEAR 07/10/2017 0045   LABSPEC 1.009 07/10/2017 0045   PHURINE 5.0 07/10/2017 0045   GLUCOSEU NEGATIVE 07/10/2017 0045   HGBUR MODERATE (A) 07/10/2017 0045   BILIRUBINUR NEGATIVE 07/10/2017 0045   KETONESUR NEGATIVE 07/10/2017 0045   PROTEINUR NEGATIVE 07/10/2017 0045   NITRITE NEGATIVE 07/10/2017 0045   LEUKOCYTESUR NEGATIVE 07/10/2017 0045   Sepsis Labs: Invalid input(s): PROCALCITONIN, LACTICIDVEN  No results found for this or any previous visit (from the past 240 hour(s)).    Radiology Studies: Ir Sinus/fist Tube Chk-non Gi  Result Date: 08/09/2017 CLINICAL DATA:  History of stage IV colon cancer complicated by a malignant perforation and development of a abscess within the tail of the pancreas, post CT-guided percutaneous drainage catheter placement on 07/11/2017 by Dr. [name]. Note, the percutaneous drainage catheter was subsequent exchanged on 07/19/2017. Patient has undergone interval diverting loop colostomy and request has in made for percutaneous drainage catheter injection. Given Dr. Dois Davenport high level of clinical concern for a pancreatic leak, there has been instructions NOT to exchange or remove the percutaneous drainage catheter at this time. EXAM: SINUS TRACT INJECTION/FISTULOGRAM COMPARISON:  CT abdomen pelvis - 07/29/2017; 07/10/2017; CT-guided percutaneous drainage catheter placement - 07/11/2017; fluoroscopic guided drainage catheter exchange -07/19/2017. CONTRAST:  15 cc Isovue 300 FLUOROSCOPY TIME:  1 minute (9 mGy) TECHNIQUE: The patient  was positioned supine on the fluoroscopy table. A preprocedural spot fluoroscopic image was obtained of the left upper abdominal quadrant and existing percutaneous drainage catheter Multiple spot fluoroscopic and radiographic images were obtained following the injection of a small amount of contrast via the existing percutaneous drainage catheter. Images were reviewed and the procedure was terminated. The catheter was flushed with a small amount of saline and reconnected to a JP bulb.  FINDINGS: Contrast injection demonstrates opacification of the decompressed abscess cavity. There is reflux of contrast along the catheter tract to the entrance site at the skin surface. There is apparent communication between the decompressed abscess cavity and a serpiginous tubular structure within the left upper abdominal quadrant which while indeterminate may represent the pancreatic duct. IMPRESSION: Drainage catheter injection demonstrates the decompressed abscess cavity communicates with a serpiginous tubular structure within the left upper abdominal quadrant, indeterminate though potentially representative of the pancreatic duct. Correlation with amylase and lipase levels of the acquired fluid from the drainage catheter could be performed as clinically indicated. Electronically Signed   By: Sandi Mariscal M.D.   On: 08/09/2017 18:33   Scheduled Meds: . Chlorhexidine Gluconate Cloth  6 each Topical Q0600  . pantoprazole (PROTONIX) IV  40 mg Intravenous Q12H  . sodium chloride flush  10-40 mL Intracatheter Q12H   Continuous Infusions: . dextrose 5 % and 0.9 % NaCl with KCl 20 mEq/L 65 mL/hr at 08/10/17 0451  . [place] KitchenTPN (CLINIMIX-E) Adult     And  . fat emulsion    . heparin    . Marland KitchenTPN (CLINIMIX-E) Adult 75 mL/hr at 08/09/17 [place], MD, PhD [facility] Pager [phone]  If 7PM-7AM, please contact night-coverage [email or web address] Password [email or web address] 08/10/2017, 1:21 PM

## 2017-08-10 NOTE — Progress Notes (Signed)
Nutrition Follow-up  DOCUMENTATION CODES:   Severe malnutrition in context of chronic illness, Underweight  INTERVENTION:   TPN per Pharmacy Will monitor for diet advancement   NUTRITION DIAGNOSIS:   Malnutrition (severe) related to chronic illness, catabolic illness, cancer and cancer related treatments as evidenced by severe depletion of muscle mass, severe depletion of body fat, percent weight loss.  Ongoing.  GOAL:   Patient will meet greater than or equal to 90% of their needs  Meeting with TPN.  MONITOR:   Weight trends, Labs, Skin, I & O's, Other (Comment) (TPN regimen)  ASSESSMENT:   56 y.o. 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 Patient reports that he was in his usual state until approximately 3 days ago when he noted the fairly sudden onset of pain in the mid abdomen with nausea. He went on to develop nonbloody nonbilious vomiting, had a couple loose stools, and has had loss of appetite.   Patient continues to receive Clinimix E 5/20 @ 75 ml/hr w/ 20% ILE @ 20 ml/hr x 12 hours providing 2064 kcal and 90g protein.  Continues to be NPO. NGT will be clamped today. If patient tolerates, diet will be advanced to clear liquids per surgery note.   Medications: IV Protonix BID, D5 and .9% NaCl w/ KCl infusion at 65 ml/hr -provides 265 kcal Labs reviewed: Low Na  Plan per Pharmacy 8/14: Now:  Magnesium sulfate 2gm IV x1  At 1800 today:  Continue Clinimix E 5/20 at goal rate of 75 mL/hr  Continue 20% fat emulsion at 77ml/hr x 12 hours daily  8/10 -Pt has been NPO since admission (8 days). He is receiving TPN at goal rate via port: Clinimix E 5/20 @ 75 mL/hr with 20% ILE @ 20 mL/hr x12 hours.  This regimen provides 90 grams of protein and 2064 kcal. Weight +5.2 kg/11 lb from 8/7-8/10; will monitor weight trends closely.  -POD #3 flex sig with attempted stent placement but findings of 100% occlusion to descending colon d/t tumor and stent was ultimately unable to be placed. POD #2 transverse loop colostomy and Surgery note from this AM states bowel function returning. NGT to suction with 850cc out over the past ~24 hours.   Diet Order:  TPN (CLINIMIX-E) Adult TPN (CLINIMIX-E) Adult Diet NPO time specified Except for: Ice Chips, Sips with Meds  Skin:  Wound (see comment) (Abdominal incision from 8/8)  Last BM:  8/13  Height:   Ht Readings from Last 1 Encounters:  07/31/17 5\' 9"  (1.753 m)    Weight:   Wt Readings from Last 1 Encounters:  08/06/17 141 lb 15.6 oz (64.4 kg)    Ideal Body Weight:  72.73 kg  BMI:  Body mass index is 20.97 kg/m.  Estimated Nutritional Needs:   Kcal:  1880-2050 (33-36 kcal/kg)  Protein:  85-97 grams (1.5-1.7 grams/kg)  Fluid:  >/= 2 L/day  EDUCATION NEEDS:   No education needs identified at this time  Clayton Bibles, MS, RD, LDN Pager: [phone] After Hours Pager: [phone]

## 2017-08-10 NOTE — Progress Notes (Signed)
PHARMACY - ADULT TOTAL PARENTERAL NUTRITION CONSULT NOTE   Pharmacy Consult for TPN Indication: SBO  Patient Measurements: Height: 5\' 9"  (175.3 cm) Weight: 141 lb 15.6 oz (64.4 kg) IBW/kg (Calculated) : 70.7 TPN AdjBW (KG): 55.3 Body mass index is 20.97 kg/m.   Insulin Requirements:  DC'd  SSI/CBGs 8/11  Current Nutrition: NPO  IVF: D5NS + KCl 20 mEq/L running at 65 mL/hr  Central access: PICC TPN start date: 8/7  ASSESSMENT                                                                                                          HPI:  56 y.o.malewith medical hx significant for metastatic colon cancer, on chemo, pancreatic abscess with percutaneous drain, iron deficiency anemia, and hx of right leg DVT, on Xarelto.  Presented to the ED for evaluation of 3 days of abdominal pain with nausea, vomiting, and anorexia. CT of the abdomen and pelvis at the time of admission revealed a bowel obstruction at the level of the descending colon at the location of the percutaneous drainage catheter.  Significant events:  8/7: Sigmoidoscopy- Malignant completely obstructing tumor in the                                    descending colon. 8/8: Transverse Loop Colostomy   Recent Labs  08/08/17 0601 08/09/17 0523 08/10/17 0531  NA 133* 132* 133*  K 4.1 4.5 4.7  CL 98* 101 100*  CO2 26 25 24   GLUCOSE 130* 133* 125*  BUN 14 13 13   CREATININE 0.55* 0.66 0.64  CALCIUM 8.5* 8.6* 9.1  PHOS  --  4.7*  --   MG 1.8 1.7  --   ALBUMIN  --  2.6* 2.7*  ALKPHOS  --  64 98  AST  --  19 30  ALT  --  7* 13*  BILITOT  --  0.4 0.3  TRIG  --  83  --   PREALBUMIN  --  5.6*  --   Corr Ca 9.5   Today:   Glucose (goal <150): no hx DM;  SSI and CBGs stopped on 8/11  Electrolytes:  Na slightly low; Mag remains at 1.7 s/p magnesium sulfate 2gm given yesterday  Renal: SCr WNL, stable  LFTs: ALT low  TGs: 445 (8/8), 29 (8/9), 83 (8/13)  Prealbumin - 6.4 (8/4), 9.8 (8/8), 5.6 (8/13)  I/O  (-) 662 ml ;  NG output down 100 mL/24 hr, colostomy 835 ml   NUTRITIONAL GOALS  RD recs (8/10): Kcal:  1880-2050 (33-36 kcal/kg) Protein:  85-97 grams (1.5-1.7 grams/kg)  Clinimix E 5/20 at a goal rate of 64ml/hr + 20% fat emulsion at 78ml/hr x 12 hours  to provide: 90 g/day protein, 2064 Kcal/day.  PLAN                                                                                                                          Now:  Magnesium sulfate 2gm IV x1  At 1800 today:  Continue Clinimix E 5/20 at goal rate of 75 mL/hr  Continue 20% fat emulsion at 25ml/hr x 12 hours daily  TPN to contain standard multivitamins and trace elements.  continue mIVF at 65 mL/hr or per MD  TPN lab panels on Mondays & Thursdays.  Dia Sitter, PharmD, BCPS 08/10/2017 7:09 AM

## 2017-08-10 NOTE — Consult Note (Signed)
[place] Nurse ostomy follow up Stoma type/location: RUQ loop ileostomy with skin bridge (red rubber catheter) sutured in place.  Jackson Latino, CCS PA in for assessment and asks that rod be removed. This is done easily and without adverse event. Stomal assessment/size: Now measures 1 and 1/2 inches and is generally round.  Os in center points downward toward 6 o'clock.  Peristomal assessment: intact Treatment options for stomal/peristomal skin: skin barrier ring Output : green effluent with mucous Ostomy pouching: 1pc. Convex pouching system cut off-center and after placement of skin barrier ring  Education provided: Patient is engaged only momentarily to request pain medication prior to beginning procedure. No family in room. Bedside RN and CCS PA Focht in to administer pain med, assess and photograph ostomy after rod removal and wound. NGT still in place although it is being clamped today.  Discussed with Case Management the need to consider higher level of care than home as the ostomy, while a colostomy, is right-sided and will function in many ways like an ileostomy, i.e., requiring frequent emptying.  There may also be interventions required for the prompt treatment of early skin irritations.  I have assembled supplies today (two sizes of convex pouches, rings, patterns, ostomy powder and a belt) and placed in room along with educational booklet. Focus over next few days will be to see if patent can be independent in pouch emptying. Enrolled patient in [place] Discharge program: Yes, today.  4 pouches, 4 rings, another belt requested.  [facility] nursing team will continue follow, but will remain available to this patient, the nursing, surgical and medical teams.   Thanks, Maudie Flakes, MSN, RN, [place], Arther Abbott  Pager# [phone]

## 2017-08-11 ENCOUNTER — Ambulatory Visit: Payer: Medicaid Other | Admitting: Hematology

## 2017-08-11 ENCOUNTER — Encounter: Payer: Medicaid Other | Admitting: Nutrition

## 2017-08-11 LAB — CBC
HCT: 24.5 % — ABNORMAL LOW (ref 39.0–52.0)
HEMATOCRIT: 24.6 % — AB (ref 39.0–52.0)
HEMOGLOBIN: 8.1 g/dL — AB (ref 13.0–17.0)
Hemoglobin: 8.2 g/dL — ABNORMAL LOW (ref 13.0–17.0)
MCH: 28.3 pg (ref 26.0–34.0)
MCH: 28.3 pg (ref 26.0–34.0)
MCHC: 33.3 g/dL (ref 30.0–36.0)
MCHC: 33.1 g/dL (ref 30.0–36.0)
MCV: 84.8 fL (ref 78.0–100.0)
MCV: 85.7 fL (ref 78.0–100.0)
PLATELETS: 123 10*3/uL — AB (ref 150–400)
Platelets: 125 10*3/uL — ABNORMAL LOW (ref 150–400)
RBC: 2.9 MIL/uL — ABNORMAL LOW (ref 4.22–5.81)
RBC: 2.86 MIL/uL — AB (ref 4.22–5.81)
RDW: 14.6 % (ref 11.5–15.5)
RDW: 14.6 % (ref 11.5–15.5)
WBC: 8.6 10*3/uL (ref 4.0–10.5)
WBC: 6.9 10*3/uL (ref 4.0–10.5)

## 2017-08-11 LAB — MAGNESIUM: MAGNESIUM: 1.6 mg/dL — AB (ref 1.7–2.4)

## 2017-08-11 LAB — BASIC METABOLIC PANEL
Anion gap: 7 (ref 5–15)
BUN: 15 mg/dL (ref 6–20)
CO2: 25 mmol/L (ref 22–32)
Calcium: 9.3 mg/dL (ref 8.9–10.3)
Chloride: 101 mmol/L (ref 101–111)
Creatinine, Ser: 0.6 mg/dL — ABNORMAL LOW (ref 0.61–1.24)
Glucose, Bld: 117 mg/dL — ABNORMAL HIGH (ref 65–99)
POTASSIUM: 5.2 mmol/L — AB (ref 3.5–5.1)
SODIUM: 133 mmol/L — AB (ref 135–145)

## 2017-08-11 MED ORDER — SODIUM CHLORIDE 0.9 % IV SOLN
INTRAVENOUS | Status: AC
  Administered 2017-08-11: 14:00:00 via INTRAVENOUS

## 2017-08-11 MED ORDER — TRACE MINERALS CR-CU-MN-SE-ZN 10-1000-500-60 MCG/ML IV SOLN
INTRAVENOUS | Status: AC
  Administered 2017-08-11: 18:00:00 via INTRAVENOUS
  Filled 2017-08-11: qty 1800

## 2017-08-11 MED ORDER — ENSURE ENLIVE PO LIQD
237.0000 mL | Freq: Two times a day (BID) | ORAL | Status: DC
  Administered 2017-08-11 – 2017-08-16 (×4): 237 mL via ORAL

## 2017-08-11 MED ORDER — FAT EMULSION 20 % IV EMUL
240.0000 mL | INTRAVENOUS | Status: AC
  Administered 2017-08-11: 240 mL via INTRAVENOUS
  Filled 2017-08-11: qty 250

## 2017-08-11 NOTE — Progress Notes (Signed)
PHARMACY - ADULT TOTAL PARENTERAL NUTRITION CONSULT NOTE   Pharmacy Consult for TPN Indication: SBO  Patient Measurements: Height: 5\' 9"  (175.3 cm) Weight: 141 lb 15.6 oz (64.4 kg) IBW/kg (Calculated) : 70.7 TPN AdjBW (KG): 55.3 Body mass index is 20.97 kg/m.   Insulin Requirements:  DC'd  SSI/CBGs 8/11  Current Nutrition: clear liquid diet; TPN; ensure bid ordered on 8/15  IVF: D5NS + KCl 20 mEq/L running at 65 mL/hr  Central access: PICC TPN start date: 8/7  ASSESSMENT                                                                                                          HPI:  56 y.o.malewith medical hx significant for metastatic colon cancer, on chemo, pancreatic abscess with percutaneous drain, iron deficiency anemia, and hx of right leg DVT, on Xarelto.  Presented to the ED for evaluation of 3 days of abdominal pain with nausea, vomiting, and anorexia. CT of the abdomen and pelvis at the time of admission revealed a bowel obstruction at the level of the descending colon at the location of the percutaneous drainage catheter.  Significant events:  8/7: Sigmoidoscopy- Malignant completely obstructing tumor in the                                    descending colon. 8/8: Transverse Loop Colostomy 8/14: starting clear liquid diet; clamp NG tube 8/15: full liquid diet  Today:   Glucose (goal <150): no hx DM;  SSI and CBGs stopped on 8/11  Electrolytes: no new labs today  Renal: SCr WNL, stable  LFTs: ALT low  TGs: 445 (8/8), 29 (8/9), 83 (8/13)  Prealbumin - 6.4 (8/4), 9.8 (8/8), 5.6 (8/13)  I/O (-) 100 ml ;  colostomy 1010 ml   NUTRITIONAL GOALS                                                                                             RD recs (8/10): Kcal:  1880-2050 (33-36 kcal/kg) Protein:  85-97 grams (1.5-1.7 grams/kg)  Clinimix E 5/20 at a goal rate of 42ml/hr + 20% fat emulsion at 79ml/hr x 12 hours  to provide: 90 g/day protein, 2064  Kcal/day.  PLAN  At 1800 today:  Continue Clinimix E 5/20 at goal rate of 75 mL/hr  Continue 20% fat emulsion at 6ml/hr x 12 hours daily  TPN to contain standard multivitamins and trace elements.  continue mIVF at 65 mL/hr or per MD  TPN lab panels on Mondays & Thursdays.  F/u oral intake and ability to wean TPN  Dia Sitter, PharmD, BCPS 08/11/2017 7:59 AM

## 2017-08-11 NOTE — Progress Notes (Signed)
Informed Dr. Kae Heller that patients heparin gtt had been stopped per Dr. Hal Hope due to increased bleeding

## 2017-08-11 NOTE — Progress Notes (Signed)
PT Cancellation Note  Patient Details Name: Jeff [name] MRN: [id number] [name]: 1961/10/01   Cancelled Treatment:    Reason Eval/Treat Not Completed: Medical issues which prohibited therapy;Pain limiting ability to participate. Patient reports that  He is coughing  A lot and causing abdominal pain. Will check back at another time.   Marcelino Freestone PT [phone]  08/11/2017, 1:07 PM

## 2017-08-11 NOTE — Progress Notes (Signed)
Paged Dr. Hal Hope due to patients incision increasing bleeding at site.  Stated to order STAT CBC, turn off Heparin gtt, and inform Surgery of increased bleeding.

## 2017-08-11 NOTE — Progress Notes (Addendum)
ANTICOAGULATION CONSULT NOTE -   Pharmacy Consult for Heparin Indication: hx DVT- bridge while off Xarelto  No Known Allergies  Patient Measurements: Height: 5\' 9"  (175.3 cm) Weight: 141 lb 15.6 oz (64.4 kg) IBW/kg (Calculated) : 70.7 Heparin Dosing Weight: 56.9 kg (act BW)  Vital Signs: Temp: 98.5 F (36.9 C) (08/15 0600) Temp Source: Oral (08/15 0600) BP: 128/79 (08/15 0600) Pulse Rate: 92 (08/15 0600)  Labs:  Recent Labs  08/09/17 0523  08/09/17 2239 08/10/17 0531 08/10/17 1720 08/11/17 0155 08/11/17 0835  HGB 8.4*  --   --  8.3*  --  8.2* 8.1*  HCT 24.8*  --   --  25.5*  --  24.6* 24.5*  PLT 100*  --   --  116*  --  123* 125*  HEPARINUNFRC 0.22*  < > 0.53 0.62 0.48  --   --   CREATININE 0.66  --   --  0.64  --   --  0.60*  < > = values in this interval not displayed. Estimated Creatinine Clearance: 91.7 mL/min (A) (by C-G formula based on SCr of 0.6 mg/dL (L)).  Assessment: 56 y.o M on Xarelto for hx DVT was admitted with bowel obstruction secondary to metastatic colon cancer. Orders were received to switch anticoagulation to IV heparin during hospitalization in anticipation of surgery or other invasive procedures.  Significant events:  8/7: Sigmoidoscopy- Malignant completely obstructing tumor in the descending colon. 8/8: diverting colostomy -- heparin drip turned off at 0730 for procedure; resumed 6 hrs post-op 8/12: Heparin discontinued at 05 am 2nd to large clots of blood mixed in with stool in ostomy bag. D/w Dr Zella Richer - restart heparin with no bolus at a lower rate.  8/13: still with blood noted in ostomy bag; Spoke to Dr. Hunt Oris-- suspects old blood in bag, recom to cont with heparin drip for now 8/15: bleeding from abd incision site--> heparin drip d/ced at ~0100; continues to have blood in ostomy bag   Today, 08/11/2017: - heparin drip d/ced this AM at ~0100 d/t bleeding - hgb low but relatively stable, plt trending up  Goal of Therapy:   Heparin level adjusted to 0.3 - 0.5 on 8/12 2nd bleeding Monitor platelets by anticoagulation protocol: Yes   Plan: - Per CCS, holding heparin for now d/t bleeding - please advise pharmacy if/when you want [place] to resume heparin for patient   Lynelle Doctor 08/11/2017 11:20 AM

## 2017-08-11 NOTE — Progress Notes (Signed)
Referring Physician(s): Hoxworth,B  Supervising Physician: Jeff [name]  Patient Status:  [facility] - In-pt  Chief Complaint: Left abdominal fluid collection   Subjective: Pt doing ok; intermittent nausea; has had some bleeding from mid abd wound; small amount bloody fluid in left abd drain JP bulb; drain difficult to flush today and with minimal output   Allergies: Patient has no known allergies.  Medications: Prior to Admission medications   Medication Sig Start Date End Date Taking? Authorizing Provider  clindamycin (CLINDAGEL) 1 % gel Apply topically 2 (two) times daily. 04/12/17  Yes [name], MD  Clindamycin Phos-Benzoyl Perox gel Apply 1 application topically 2 (two) times daily. 04/14/17  Yes [name], MD  ferrous sulfate 325 (65 FE) MG tablet Take 1 tablet (325 mg total) by mouth 2 (two) times daily with a meal. 03/29/17  Yes Curcio, Roselie Awkward, NP  hydrocortisone 2.5 % cream Apply topically 2 (two) times daily. 05/25/17  Yes [name], MD  lidocaine-prilocaine (EMLA) cream Apply to affected area once 01/27/17  Yes [name], MD  ondansetron (ZOFRAN) 8 MG tablet Take 1 tablet (8 mg total) by mouth 2 (two) times daily as needed for refractory nausea / vomiting. Start on day 3 after chemotherapy. 05/25/17  Yes [name], MD  Oxycodone HCl 10 MG TABS Take 1 tablet (10 mg total) by mouth every 6 (six) hours as needed (pain). 07/26/17 08/25/17 Yes [name], MD  prochlorperazine (COMPAZINE) 10 MG tablet Take 1 tablet (10 mg total) by mouth every 6 (six) hours as needed (Nausea or vomiting). 05/25/17  Yes [name], MD  rivaroxaban (XARELTO) 20 MG TABS tablet Take 1 tablet (20 mg total) by mouth daily with supper. 05/25/17  Yes [name], MD  loperamide (IMODIUM A-D) 2 MG tablet Take 1 tablet (2 mg total) by mouth 4 (four) times daily as needed for diarrhea or loose stools. Patient not taking: Reported on 07/29/2017 03/02/17   [name], MD  mirtazapine (REMERON) 15 MG tablet Take 1 tablet (15  mg total) by mouth at bedtime. Patient not taking: Reported on 07/29/2017 06/22/17   Gardenia Phlegm, NP     Vital Signs: BP 124/70 (BP Location: Right Arm)   Pulse (!) 108   Temp 99.3 F (37.4 C) (Oral)   Resp 18   Ht 5\' 9"  (1.753 m)   Wt 141 lb 15.6 oz (64.4 kg)   SpO2 99%   BMI 20.97 kg/m   Physical Exam awake/alert; LUQ drain intact, insertion site ok, mildly tender, minimal output bloody fluid; unable to flush drain,? clotted with reflux of saline out insertion site  Imaging: Ir Sinus/fist Tube Chk-non Gi  Result Date: 08/09/2017 CLINICAL DATA:  History of stage IV colon cancer complicated by a malignant perforation and development of a abscess within the tail of the pancreas, post CT-guided percutaneous drainage catheter placement on 07/11/2017 by Dr. [name]. Note, the percutaneous drainage catheter was subsequent exchanged on 07/19/2017. Patient has undergone interval diverting loop colostomy and request has in made for percutaneous drainage catheter injection. Given Dr. Dois Davenport high level of clinical concern for a pancreatic leak, there has been instructions NOT to exchange or remove the percutaneous drainage catheter at this time. EXAM: SINUS TRACT INJECTION/FISTULOGRAM COMPARISON:  CT abdomen pelvis - 07/29/2017; 07/10/2017; CT-guided percutaneous drainage catheter placement - 07/11/2017; fluoroscopic guided drainage catheter exchange -07/19/2017. CONTRAST:  15 cc Isovue 300 FLUOROSCOPY TIME:  1 minute (9 mGy) TECHNIQUE: The patient was positioned supine on the fluoroscopy  table. A preprocedural spot fluoroscopic image was obtained of the left upper abdominal quadrant and existing percutaneous drainage catheter Multiple spot fluoroscopic and radiographic images were obtained following the injection of a small amount of contrast via the existing percutaneous drainage catheter. Images were reviewed and the procedure was terminated. The catheter was flushed with a small amount of  saline and reconnected to a JP bulb. FINDINGS: Contrast injection demonstrates opacification of the decompressed abscess cavity. There is reflux of contrast along the catheter tract to the entrance site at the skin surface. There is apparent communication between the decompressed abscess cavity and a serpiginous tubular structure within the left upper abdominal quadrant which while indeterminate may represent the pancreatic duct. IMPRESSION: Drainage catheter injection demonstrates the decompressed abscess cavity communicates with a serpiginous tubular structure within the left upper abdominal quadrant, indeterminate though potentially representative of the pancreatic duct. Correlation with amylase and lipase levels of the acquired fluid from the drainage catheter could be performed as clinically indicated. Electronically Signed   By: Jeff [name] M.D.   On: 08/09/2017 18:33   Dg Chest Port 1 View  Result Date: 08/08/2017 CLINICAL DATA:  Cough today EXAM: PORTABLE CHEST 1 VIEW COMPARISON:  08/06/2017 FINDINGS: Stable right jugular Port-A-Cath. Normal heart size. Bibasilar atelectasis. No pneumothorax. NG tube tip is in the proximal duodenum. IMPRESSION: Bibasilar atelectasis. NG tube tip in the proximal duodenum.  This should be retracted. Electronically Signed   By: Marybelle Killings M.D.   On: 08/08/2017 10:57    Labs:  CBC:  Recent Labs  08/09/17 0523 08/10/17 0531 08/11/17 0155 08/11/17 0835  WBC 7.4 7.3 8.6 6.9  HGB 8.4* 8.3* 8.2* 8.1*  HCT 24.8* 25.5* 24.6* 24.5*  PLT 100* 116* 123* 125*    COAGS:  Recent Labs  01/11/17 1009 01/29/17 1256 07/11/17 0340 07/29/17 1954  INR 1.20 1.07 1.27  --   APTT 34 31  --  32    BMP:  Recent Labs  08/08/17 0601 08/09/17 0523 08/10/17 0531 08/11/17 0835  NA 133* 132* 133* 133*  K 4.1 4.5 4.7 5.2*  CL 98* 101 100* 101  CO2 26 25 24 25   GLUCOSE 130* 133* 125* 117*  BUN 14 13 13 15   CALCIUM 8.5* 8.6* 9.1 9.3  CREATININE 0.55* 0.66 0.64  0.60*  GFRNONAA >60 >60 >60 >60  GFRAA >60 >60 >60 >60    LIVER FUNCTION TESTS:  Recent Labs  08/05/17 0430 08/06/17 0539 08/09/17 0523 08/10/17 0531  BILITOT 0.4 0.2* 0.4 0.3  AST 18 20 19 30   ALT 7* 7* 7* 13*  ALKPHOS 36* 45 64 98  PROT 6.3* 6.6 7.1 7.5  ALBUMIN 2.6* 2.6* 2.6* 2.7*    Assessment and Plan: Metastatic left colon cancer with history of pancreatic tail abscess, s/p perc drain on 07/10/17; AF: WBC nl; hgb 8.1, creat .60, K 5.2; latest drain injection study on 8/13 reveals  decompressed abscess cavity communicating with a serpiginous tubular structure within the left upper abdominal quadrant, indeterminate though potentially representative of the pancreatic duct. Drain now difficult to flush and potentially clotted with only minimal bloody output noted. Case d/w Dr. [name] and will plan drain exchange on 8/16. Above d/w pt.   Electronically Signed: D. Rowe Robert, PA-C 08/11/2017, 4:05 PM   I spent a total of 15 minutes at the the patient's bedside AND on the patient's hospital floor or unit, greater than 50% of which was counseling/coordinating care for left abdominal drain  Patient ID: Jeff [name], male   [name]: [birth year], 56 y.o.   MRN: [id number]

## 2017-08-11 NOTE — Progress Notes (Signed)
[facility]/Trauma Progress Note  7 Days Post-Op   Assessment/Plan SBO due to metastatic adenocarcinoma of colon- S/P Transverse loop colostomy 08/04/17 Dr. 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 Barry Dienes as previously planned. - IR planned to do drain study 08/13, no procedure note?? Done?? Sepsis  DVT RLE was on xarelto, now on heparin but held due to oozing from midline abdominal wound, also blood in bag this AM Severe PCM- prealbumin 6.4 on 08/01/17; on TPN.  FEN: TPN, NGT discontinued, fulls VTE: SCD's, heparin on hold ID: no abx currently Follow up: TBD  Plan: Pulled NGT and on fulls. Continue to hold heparin with blood in bag and oozing wound. Encourage ambulation   LOS: 13 days    Subjective:  CC: very mild abdominal pain  Pt states no nausea at rest but when he smells the clears from the clear liquid diet he feels nauseated. He would like to try grits and ice cream. Heparin stopped last night due to oozing midline wound. Blood in ostomy this AM.   Objective: Vital signs in last 24 hours: Temp:  [97.8 F (36.6 C)-98.6 F (37 C)] 98.5 F (36.9 C) (08/15 0600) Pulse Rate:  [92-106] 92 (08/15 0600) Resp:  [18-19] 18 (08/15 0600) BP: (107-130)/(79-85) 128/79 (08/15 0600) SpO2:  [99 %-100 %] 100 % (08/15 0600) Last BM Date: 08/11/17  Intake/Output from previous day: 08/14 0701 - 08/15 0700 In: 3374.5 [P.O.:200; I.V.:3174.5] Out: 4315 [Urine:2450; Drains:15; Stool:1010] Intake/Output this shift: Total I/O In: 5 [Other:5] Out: 400 [Stool:400]  PE: Gen: Alert, NAD, pleasant, cooperative Cardio: RRR, no murmur appreciated Pulm: CTA anteriorly b/l, Rate  andeffort normal Abd: Soft, not distended, + BS, incision appears well healing with scant amt of bleeding and no signs of infection, stoma purple, small amount of blood in ostomy, LUQ drain with scant serosanguinous drainage, mild TTP in RLQ Skin: no rashes noted, warm and dry  Anti-infectives: Anti-infectives    Start     Dose/Rate Route Frequency Ordered Stop   08/04/17 2200  piperacillin-tazobactam (ZOSYN) IVPB 3.375 g     3.375 g 12.5 mL/hr over 240 Minutes Intravenous Every 8 hours 08/04/17 1706 08/05/17 2244   08/04/17 0951  cefoTEtan in Dextrose 5% (CEFOTAN) 2-2.08 GM-% IVPB    Comments:  Algis Liming   : cabinet override      08/04/17 0951 08/04/17 1102   08/04/17 0945  cefoTEtan in Dextrose 5% (CEFOTAN) IVPB 2 g     2 g Intravenous  Once 08/04/17 0820 08/04/17 1117   08/04/17 0800  cefoTEtan (CEFOTAN) 2 g in dextrose 5 % 50 mL IVPB  Status:  Discontinued     2 g 100 mL/hr over 30 Minutes Intravenous On call to O.R. 08/04/17 0746 08/04/17 0819   07/29/17 2359  piperacillin-tazobactam (ZOSYN) IVPB 3.375 g  Status:  Discontinued     3.375 g 12.5 mL/hr over 240 Minutes Intravenous Every 8 hours 07/29/17 1729 08/04/17 1706   07/29/17 1730  piperacillin-tazobactam (ZOSYN) IVPB 3.375 g     3.375 g 100 mL/hr over 30 Minutes Intravenous  Once 07/29/17 1728 07/29/17 1853      Lab Results:   Recent Labs  08/11/17 0155 08/11/17 0835  WBC  8.6 6.9  HGB 8.2* 8.1*  HCT 24.6* 24.5*  PLT 123* 125*   BMET  Recent Labs  08/10/17 0531 08/11/17 0835  NA 133* 133*  K 4.7 5.2*  CL 100* 101  CO2 24 25  GLUCOSE 125* 117*  BUN 13 15  CREATININE 0.64 0.60*  CALCIUM 9.1 9.3   PT/INR No results for input(s): LABPROT, INR in the last 72 hours. CMP     Component Value Date/Time   NA 133 (L) 08/11/2017 0835   NA 135 (L) 07/26/2017 1014   K 5.2 (H) 08/11/2017 0835   K 3.3 (L) 07/26/2017 1014   CL 101 08/11/2017 0835   CO2 25 08/11/2017 0835   CO2 25 07/26/2017 1014    GLUCOSE 117 (H) 08/11/2017 0835   GLUCOSE 129 07/26/2017 1014   BUN 15 08/11/2017 0835   BUN 9.8 07/26/2017 1014   CREATININE 0.60 (L) 08/11/2017 0835   CREATININE 1.0 07/26/2017 1014   CALCIUM 9.3 08/11/2017 0835   CALCIUM 9.3 07/26/2017 1014   PROT 7.5 08/10/2017 0531   PROT 8.2 07/26/2017 1014   ALBUMIN 2.7 (L) 08/10/2017 0531   ALBUMIN 3.3 (L) 07/26/2017 1014   AST 30 08/10/2017 0531   AST 24 07/26/2017 1014   ALT 13 (L) 08/10/2017 0531   ALT 6 07/26/2017 1014   ALKPHOS 98 08/10/2017 0531   ALKPHOS 87 07/26/2017 1014   BILITOT 0.3 08/10/2017 0531   BILITOT 0.23 07/26/2017 1014   GFRNONAA >60 08/11/2017 0835   GFRAA >60 08/11/2017 0835   Lipase     Component Value Date/Time   LIPASE 14 07/29/2017 1122    Studies/Results: Ir Sinus/fist Tube Chk-non Gi  Result Date: 08/09/2017 CLINICAL DATA:  History of stage IV colon cancer complicated by a malignant perforation and development of a abscess within the tail of the pancreas, post CT-guided percutaneous drainage catheter placement on 07/11/2017 by Dr. [name]. Note, the percutaneous drainage catheter was subsequent exchanged on 07/19/2017. Patient has undergone interval diverting loop colostomy and request has in made for percutaneous drainage catheter injection. Given Dr. Dois Davenport high level of clinical concern for a pancreatic leak, there has been instructions NOT to exchange or remove the percutaneous drainage catheter at this time. EXAM: SINUS TRACT INJECTION/FISTULOGRAM COMPARISON:  CT abdomen pelvis - 07/29/2017; 07/10/2017; CT-guided percutaneous drainage catheter placement - 07/11/2017; fluoroscopic guided drainage catheter exchange -07/19/2017. CONTRAST:  15 cc Isovue 300 FLUOROSCOPY TIME:  1 minute (9 mGy) TECHNIQUE: The patient was positioned supine on the fluoroscopy table. A preprocedural spot fluoroscopic image was obtained of the left upper abdominal quadrant and existing percutaneous drainage catheter Multiple spot  fluoroscopic and radiographic images were obtained following the injection of a small amount of contrast via the existing percutaneous drainage catheter. Images were reviewed and the procedure was terminated. The catheter was flushed with a small amount of saline and reconnected to a JP bulb. FINDINGS: Contrast injection demonstrates opacification of the decompressed abscess cavity. There is reflux of contrast along the catheter tract to the entrance site at the skin surface. There is apparent communication between the decompressed abscess cavity and a serpiginous tubular structure within the left upper abdominal quadrant which while indeterminate may represent the pancreatic duct. IMPRESSION: Drainage catheter injection demonstrates the decompressed abscess cavity communicates with a serpiginous tubular structure within the left upper abdominal quadrant, indeterminate though potentially representative of the pancreatic duct. Correlation with amylase and lipase levels of the acquired fluid from the drainage catheter could be performed as clinically  indicated. Electronically Signed   By: Sandi Mariscal M.D.   On: 08/09/2017 18:33      Kalman Drape , [facility] Surgery 08/11/2017, 9:41 AM Pager: [phone] Consults: [phone] Mon-Fri 7:00 am-4:30 pm Sat-Sun 7:00 am-11:30 am

## 2017-08-11 NOTE — Progress Notes (Addendum)
[facility] PROGRESS NOTE    Progress Note  Jeff [name]  EQA:[id number] [name]: 1961-06-26 [name]: 07/29/2017 PCP: [name], MD     Brief Narrative:   Jeff [name] is an 56 y.o. male past medical history is significant for metastatic colon cancer and chemotherapy, pancreatic abscesses with percutaneous drain history of right lower showed a DVT on Xarelto presents to the ED for evaluation of abdominal pain nausea and vomiting and anorexia the started 3 days prior to admission.  CT scan of the abdomen and pelvis on admission revealed small bowel obstruction at the level of the descending colon, there was colonic thickening due to neoplasm. Mild decrease in size of multiloculated fluid collection in the splenic ileum GI, general surgery and met on been consulted. GI had an unsuccessful attempt of endoscopically stenting the colon, he is status post surgical intervention on his transverse colon and 08/04/2017, following surgeries had a prolonged ileus with NG tube in place.  Assessment/Plan:   Principal Problem:   Bowel obstruction (HCC) Active Problems:   Primary colon cancer with metastasis to other site ([place])   Anemia, iron deficiency   History of deep vein thrombosis (DVT) of lower extremity   Intra-abdominal abscess    Pancreatic abscess   Malignant neoplasm of splenic flexure (HCC)   Malnutrition of moderate degree   Severe malnutrition (HCC)   Colon obstruction (HCC)  Colonic obstruction/ileus: Likely related to to his colon cancer. GI was consulted Dr. Nevada Crane will attempt to stent placement which was unsuccessful 08/03/2017. Gen. surgery was tented transfers loop colostomy bypassing the metastases on 8 07/29/2017. Now the patient is postop ileus with NG tube tolerating ice chips.  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 Impression related to the  previous attending physician that was #1 has resolved he would like to continue further chemotherapy.  Pancreatic abscess: The percutaneous drain placement 07/19/2017, IR to continue further management. Cultures on 07/11/2017 showed Bacteroides. Interventional radiology perform drain study on 08/09/2017, showed the abscess was decompressed, possibly draining to the pancreatic duct.  DVT of the right lower extremity: Diagnosis on 02/17/2017 on Xarelto at home, now held on heparin infusion. Surgery to recommend when to start Xarelto. Heparin had to be stopped on 08/11/2017 due to bleeding.  Pancytopenia due to chemotherapy: Remained stable continue heparin.  Severe protein clinic malnutrition: Malnutrition Type:  Nutrition Problem: Malnutrition (severe) Etiology: chronic illness, catabolic illness, cancer and cancer related treatments   Malnutrition Characteristics:  Signs/Symptoms: severe depletion of muscle mass, severe depletion of body fat, percent weight loss   Nutrition Interventions:  Interventions: TPN   DVT prophylaxis: heaprin Family Communication:none Disposition Plan/Barrier to D/C: unable to determine Code Status:     Code Status Orders        Start     Ordered   07/29/17 1737  Full code  Continuous     07/29/17 1738    Code Status History    Date Active Date Inactive Code Status Order ID Comments User Context   07/10/2017 12:41 AM 07/14/2017  4:35 PM Full Code [id number]  Ivor Costa, MD ED   01/11/2017  1:50 AM 01/19/2017  8:08 PM Full Code [id number]  Etta Quill, DO ED        IV Access:    Peripheral IV   Procedures and diagnostic studies:   Ir Sinus/fist Tube Chk-non Gi  Result Date: 08/09/2017 CLINICAL DATA:  History of  stage IV colon cancer complicated by a malignant perforation and development of a abscess within the tail of the pancreas, post CT-guided percutaneous drainage catheter placement on 07/11/2017 by Dr. [name]. Note, the  percutaneous drainage catheter was subsequent exchanged on 07/19/2017. Patient has undergone interval diverting loop colostomy and request has in made for percutaneous drainage catheter injection. Given Dr. Dois Davenport high level of clinical concern for a pancreatic leak, there has been instructions NOT to exchange or remove the percutaneous drainage catheter at this time. EXAM: SINUS TRACT INJECTION/FISTULOGRAM COMPARISON:  CT abdomen pelvis - 07/29/2017; 07/10/2017; CT-guided percutaneous drainage catheter placement - 07/11/2017; fluoroscopic guided drainage catheter exchange -07/19/2017. CONTRAST:  15 cc Isovue 300 FLUOROSCOPY TIME:  1 minute (9 mGy) TECHNIQUE: The patient was positioned supine on the fluoroscopy table. A preprocedural spot fluoroscopic image was obtained of the left upper abdominal quadrant and existing percutaneous drainage catheter Multiple spot fluoroscopic and radiographic images were obtained following the injection of a small amount of contrast via the existing percutaneous drainage catheter. Images were reviewed and the procedure was terminated. The catheter was flushed with a small amount of saline and reconnected to a JP bulb. FINDINGS: Contrast injection demonstrates opacification of the decompressed abscess cavity. There is reflux of contrast along the catheter tract to the entrance site at the skin surface. There is apparent communication between the decompressed abscess cavity and a serpiginous tubular structure within the left upper abdominal quadrant which while indeterminate may represent the pancreatic duct. IMPRESSION: Drainage catheter injection demonstrates the decompressed abscess cavity communicates with a serpiginous tubular structure within the left upper abdominal quadrant, indeterminate though potentially representative of the pancreatic duct. Correlation with amylase and lipase levels of the acquired fluid from the drainage catheter could be performed as clinically  indicated. Electronically Signed   By: Sandi Mariscal M.D.   On: 08/09/2017 18:33     Medical Consultants:    None.  Anti-Infectives:   None  Subjective:    Jeff [name] no complaints.  Objective:    Vitals:   08/10/17 0633 08/10/17 1500 08/10/17 2106 08/11/17 0600  BP: (!) 144/91 107/80 130/85 128/79  Pulse: (!) 109 (!) 106 (!) 106 92  Resp: 18 19 18 18   Temp: 98.6 F (37 C) 97.8 F (36.6 C) 98.6 F (37 C) 98.5 F (36.9 C)  TempSrc: Oral Oral Oral Oral  SpO2: 100% 99% 100% 100%  Weight:      Height:        Intake/Output Summary (Last 24 hours) at 08/11/17 1235 Last data filed at 08/11/17 1000  Gross per 24 hour  Intake           3439.5 ml  Output             3327 ml  Net            112.5 ml   Filed Weights   08/02/17 0533 08/03/17 0500 08/06/17 0500  Weight: 55.6 kg (122 lb 9.2 oz) 59.2 kg (130 lb 8.2 oz) 64.4 kg (141 lb 15.6 oz)    Exam: General exam: Cachectic, no acute distress and stoic. Respiratory system: Good air movement and clear to auscultation. Cardiovascular system: Regular rate and rhythm with positive S1-S2. Gastrointestinal system: Abdomen soft nontender nondistended. Central nervous system: Awake alert and oriented 3. Extremities: No lower extremity edema. Skin: No rashes. Psychiatry: Judgment and insight appeared normal   Data Reviewed:    Labs: Basic Metabolic Panel:  Recent Labs Lab 08/05/17 0430 08/06/17 0539  08/07/17 0550 08/08/17 0601 08/09/17 0523 08/10/17 0531 08/11/17 0835  NA 135 129* 130* 133* 132* 133* 133*  K 4.3 4.1 4.3 4.1 4.5 4.7 5.2*  CL 104 100* 98* 98* 101 100* 101  CO2 25 22 23 26 25 24 25   GLUCOSE 144* 124* 123* 130* 133* 125* 117*  BUN 12 14 14 14 13 13 15   CREATININE 0.75 0.72 0.60* 0.55* 0.66 0.64 0.60*  CALCIUM 8.4* 8.4* 8.3* 8.5* 8.6* 9.1 9.3  MG 1.5* 1.6* 1.6* 1.8 1.7 1.7 1.6*  PHOS 3.6 3.3 3.7  --  4.7*  --   --    GFR Estimated Creatinine Clearance: 91.7 mL/min (A) (by C-G formula  based on SCr of 0.6 mg/dL (L)). Liver Function Tests:  Recent Labs Lab 08/05/17 0430 08/06/17 0539 08/09/17 0523 08/10/17 0531  AST 18 20 19 30   ALT 7* 7* 7* 13*  ALKPHOS 36* 45 64 98  BILITOT 0.4 0.2* 0.4 0.3  PROT 6.3* 6.6 7.1 7.5  ALBUMIN 2.6* 2.6* 2.6* 2.7*   No results for input(s): LIPASE, AMYLASE in the last 168 hours. No results for input(s): AMMONIA in the last 168 hours. Coagulation profile No results for input(s): INR, PROTIME in the last 168 hours.  CBC:  Recent Labs Lab 08/08/17 0601 08/09/17 0523 08/10/17 0531 08/11/17 0155 08/11/17 0835  WBC 8.3 7.4 7.3 8.6 6.9  NEUTROABS  --  5.2  --   --   --   HGB 8.7* 8.4* 8.3* 8.2* 8.1*  HCT 26.0* 24.8* 25.5* 24.6* 24.5*  MCV 84.1 84.6 85.0 84.8 85.7  PLT 99* 100* 116* 123* 125*   Cardiac Enzymes: No results for input(s): CKTOTAL, CKMB, CKMBINDEX, TROPONINI in the last 168 hours. BNP (last 3 results) No results for input(s): PROBNP in the last 8760 hours. CBG:  Recent Labs Lab 08/06/17 0553 08/06/17 1312 08/06/17 1802 08/06/17 2331 08/07/17 0535  GLUCAP 137* 117* 107* 129* 124*   D-Dimer: No results for input(s): DDIMER in the last 72 hours. Hgb A1c: No results for input(s): HGBA1C in the last 72 hours. Lipid Profile:  Recent Labs  08/09/17 0523  TRIG 83   Thyroid function studies: No results for input(s): TSH, T4TOTAL, T3FREE, THYROIDAB in the last 72 hours.  Invalid input(s): FREET3 Anemia work up: No results for input(s): VITAMINB12, FOLATE, FERRITIN, TIBC, IRON, RETICCTPCT in the last 72 hours. Sepsis Labs:  Recent Labs Lab 08/09/17 0523 08/10/17 0531 08/11/17 0155 08/11/17 0835  WBC 7.4 7.3 8.6 6.9   Microbiology No results found for this or any previous visit (from the past 240 hour(s)).   Medications:   . Chlorhexidine Gluconate Cloth  6 each Topical Q0600  . feeding supplement (ENSURE ENLIVE)  237 mL Oral BID BM  . pantoprazole (PROTONIX) IV  40 mg Intravenous Q12H  .  sodium chloride flush  10-40 mL Intracatheter Q12H   Continuous Infusions: . dextrose 5 % and 0.9 % NaCl with KCl 20 mEq/L 65 mL/hr at 08/11/17 0600  . Marland KitchenTPN (CLINIMIX-E) Adult     And  . fat emulsion    . Marland KitchenTPN (CLINIMIX-E) Adult 75 mL/hr at 08/11/17 0600      LOS: 13 days   Charlynne Cousins  [facility] Pager [phone]  *Please refer to [email or web address], password [email or web address] to get updated schedule on who will round on this patient, as hospitalists switch teams weekly. If 7PM-7AM, please contact night-coverage at [email or web address], password [email or web address] for any overnight needs.  08/11/2017, 12:35 PM

## 2017-08-12 ENCOUNTER — Encounter (HOSPITAL_COMMUNITY): Payer: Self-pay | Admitting: Diagnostic Radiology

## 2017-08-12 ENCOUNTER — Inpatient Hospital Stay (HOSPITAL_COMMUNITY): Payer: Medicaid Other

## 2017-08-12 HISTORY — PX: IR SINUS/FIST TUBE CHK-NON GI: IMG673

## 2017-08-12 LAB — MAGNESIUM: MAGNESIUM: 1.6 mg/dL — AB (ref 1.7–2.4)

## 2017-08-12 LAB — COMPREHENSIVE METABOLIC PANEL
ALK PHOS: 123 U/L (ref 38–126)
ALT: 14 U/L — ABNORMAL LOW (ref 17–63)
ANION GAP: 8 (ref 5–15)
AST: 22 U/L (ref 15–41)
Albumin: 2.8 g/dL — ABNORMAL LOW (ref 3.5–5.0)
BUN: 17 mg/dL (ref 6–20)
CALCIUM: 9.6 mg/dL (ref 8.9–10.3)
CHLORIDE: 102 mmol/L (ref 101–111)
CO2: 24 mmol/L (ref 22–32)
Creatinine, Ser: 0.65 mg/dL (ref 0.61–1.24)
GFR calc non Af Amer: >60 mL/min (ref >60)
Glucose, Bld: 101 mg/dL — ABNORMAL HIGH (ref 65–99)
Potassium: 4.5 mmol/L (ref 3.5–5.1)
SODIUM: 134 mmol/L — AB (ref 135–145)
Total Bilirubin: 0.4 mg/dL (ref 0.3–1.2)
Total Protein: 8 g/dL (ref 6.5–8.1)

## 2017-08-12 LAB — CBC WITH DIFFERENTIAL/PLATELET
BASOS ABS: 0.1 10*3/uL (ref 0.0–0.1)
BASOS PCT: 1 %
EOS PCT: 4 %
Eosinophils Absolute: 0.3 10*3/uL (ref 0.0–0.7)
HCT: 26.5 % — ABNORMAL LOW (ref 39.0–52.0)
Hemoglobin: 8.6 g/dL — ABNORMAL LOW (ref 13.0–17.0)
LYMPHS PCT: 23 %
Lymphs Abs: 1.7 10*3/uL (ref 0.7–4.0)
MCH: 27.7 pg (ref 26.0–34.0)
MCHC: 32.5 g/dL (ref 30.0–36.0)
MCV: 85.5 fL (ref 78.0–100.0)
Monocytes Absolute: 0.6 10*3/uL (ref 0.1–1.0)
Monocytes Relative: 8 %
Neutro Abs: 4.7 10*3/uL (ref 1.7–7.7)
Neutrophils Relative %: 64 %
PLATELETS: 151 10*3/uL (ref 150–400)
RBC: 3.1 MIL/uL — AB (ref 4.22–5.81)
RDW: 14.7 % (ref 11.5–15.5)
WBC: 7.3 10*3/uL (ref 4.0–10.5)

## 2017-08-12 LAB — PROTIME-INR
INR: 1.02
PROTHROMBIN TIME: 13.4 s (ref 11.4–15.2)

## 2017-08-12 LAB — PHOSPHORUS: PHOSPHORUS: 5.1 mg/dL — AB (ref 2.5–4.6)

## 2017-08-12 MED ORDER — OXYCODONE-ACETAMINOPHEN 5-325 MG PO TABS
1.0000 | ORAL_TABLET | ORAL | Status: DC | PRN
Start: 2017-08-12 — End: 2017-08-16
  Administered 2017-08-12 – 2017-08-15 (×7): 2 via ORAL
  Filled 2017-08-12 (×7): qty 2

## 2017-08-12 MED ORDER — FAT EMULSION 20 % IV EMUL
240.0000 mL | INTRAVENOUS | Status: AC
  Administered 2017-08-12: 240 mL via INTRAVENOUS
  Filled 2017-08-12: qty 250

## 2017-08-12 MED ORDER — LIDOCAINE-EPINEPHRINE (PF) 2 %-1:200000 IJ SOLN
INTRAMUSCULAR | Status: AC
  Filled 2017-08-12: qty 20

## 2017-08-12 MED ORDER — HYDROMORPHONE HCL-NACL 0.5-0.9 MG/ML-% IV SOSY
1.0000 mg | PREFILLED_SYRINGE | INTRAVENOUS | Status: DC | PRN
  Administered 2017-08-12 – 2017-08-15 (×4): 1 mg via INTRAVENOUS
  Filled 2017-08-12 (×4): qty 2

## 2017-08-12 MED ORDER — IOPAMIDOL (ISOVUE-300) INJECTION 61%
INTRAVENOUS | Status: AC
  Filled 2017-08-12: qty 100

## 2017-08-12 MED ORDER — IOPAMIDOL (ISOVUE-300) INJECTION 61%
INTRAVENOUS | Status: AC
  Administered 2017-08-12: 15 mL
  Filled 2017-08-12: qty 50

## 2017-08-12 MED ORDER — IOPAMIDOL (ISOVUE-300) INJECTION 61%
100.0000 mL | Freq: Once | INTRAVENOUS | Status: AC | PRN
  Administered 2017-08-12: 80 mL via INTRAVENOUS

## 2017-08-12 MED ORDER — TRACE MINERALS CR-CU-MN-SE-ZN 10-1000-500-60 MCG/ML IV SOLN
INTRAVENOUS | Status: AC
  Administered 2017-08-12: 19:00:00 via INTRAVENOUS
  Filled 2017-08-12: qty 960

## 2017-08-12 MED ORDER — MAGNESIUM SULFATE 2 GM/50ML IV SOLN
2.0000 g | Freq: Once | INTRAVENOUS | Status: AC
  Administered 2017-08-12: 2 g via INTRAVENOUS
  Filled 2017-08-12: qty 50

## 2017-08-12 NOTE — Procedures (Signed)
Drain was successfully flushed with 3 ml syringe.  Contrast injection demonstrates small abscess cavity BUT a fistula tract is no longer identified.  Recommend repeat CT of abd and pelvis with IV contrast to evaluate this area.  If the collection has resolved on CT, then we can probably remove the drain.

## 2017-08-12 NOTE — Progress Notes (Signed)
[facility]/Trauma Progress Note  8 Days Post-Op   Assessment/Plan SBO due to metastatic adenocarcinoma of colon- S/P Transverse loop colostomy 08/04/17 Dr. 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 Barry Dienes as previously planned. - IR plan to do drain replacement today?? Sepsis  DVT RLE was on xarelto, now on heparin but held due to oozing from midline abdominal wound, also blood in bag yesterday (08/15) - continue to hold for now pending IR drain replacement Severe PCM- prealbumin 6.4 on 08/01/17; on TPN.  FEN: TPN decreased to 8mL/hr, fulls VTE: SCD's, heparin on hold ID: no abx currently Follow up: TBD  Plan: Continue to hold heparin until after drain replacement with IR supposed to be today 08/16.Encourage ambulation. Advance to soft diet after IR procedure.    LOS: 14 days    Subjective:  CC: abdominal pain  Pt states mild abdominal pain. No nausea or vomiting with food. Tolerating diet. No fevers or chills, or cough. Pt is upset about yesterday. He told me he felt like he was treated like a dog. He was upset with his nurse. I spoke to the Furniture conservator/restorer and his nurse today about this. Family not at bedside. I stressed to the patient that our top priority is his care and comfort.   Objective: Vital signs in last 24 hours: Temp:  [98.5 F (36.9 C)-99.3 F (37.4 C)] 98.8 F (37.1 C) (08/16 0519) Pulse Rate:  [102-111] 102 (08/16 0519) Resp:  [18] 18 (08/16 0519) BP: (118-124)/(70-80) 118/80 (08/16 0519) SpO2:  [99 %-100 %] 100 % (08/16 0519) Weight:  [118 lb 6.2 oz (53.7 kg)] 118 lb 6.2 oz (53.7 kg) (08/16 0519) Last BM  Date: 08/11/17  Intake/Output from previous day: 08/15 0701 - 08/16 0700 In: 3460.8 [P.O.:237; I.V.:3208.8] Out: 1610 [Urine:2250; Drains:2; RUEAV:4098] Intake/Output this shift: No intake/output data recorded.  PE: Gen: Alert, NAD, pleasant, cooperative Cardio: RRR, no murmur appreciated Pulm: CTA anteriorly b/l, Rate andeffort normal Abd: Soft, not distended, +BS, incision appears well healing with no bleeding and no signs of infection, stoma purple, no blood in ostomy,  copious green liquid stool in ostomy, LUQ drain with scant serosanguinous drainage, no TTP Skin: no rashes noted, warm and dry   Anti-infectives: Anti-infectives    Start     Dose/Rate Route Frequency Ordered Stop   08/04/17 2200  piperacillin-tazobactam (ZOSYN) IVPB 3.375 g     3.375 g 12.5 mL/hr over 240 Minutes Intravenous Every 8 hours 08/04/17 1706 08/05/17 2244   08/04/17 0951  cefoTEtan in Dextrose 5% (CEFOTAN) 2-2.08 GM-% IVPB    Comments:  Algis Liming   : cabinet override      08/04/17 0951 08/04/17 1102   08/04/17 0945  cefoTEtan in Dextrose 5% (CEFOTAN) IVPB 2 g     2 g Intravenous  Once 08/04/17 0820 08/04/17 1117   08/04/17 0800  cefoTEtan (CEFOTAN) 2 g in dextrose 5 % 50 mL IVPB  Status:  Discontinued     2 g 100 mL/hr over 30 Minutes Intravenous On call to O.R. 08/04/17 1191 08/04/17 0819   07/29/17 2359  piperacillin-tazobactam (ZOSYN) IVPB 3.375 g  Status:  Discontinued  3.375 g 12.5 mL/hr over 240 Minutes Intravenous Every 8 hours 07/29/17 1729 08/04/17 1706   07/29/17 1730  piperacillin-tazobactam (ZOSYN) IVPB 3.375 g     3.375 g 100 mL/hr over 30 Minutes Intravenous  Once 07/29/17 1728 07/29/17 1853      Lab Results:   Recent Labs  08/11/17 0835 08/12/17 0527  WBC 6.9 7.3  HGB 8.1* 8.6*  HCT 24.5* 26.5*  PLT 125* 151   BMET  Recent Labs  08/11/17 0835 08/12/17 0527  NA 133* 134*  K 5.2* 4.5  CL 101 102  CO2 25 24  GLUCOSE 117* 101*  BUN 15 17  CREATININE  0.60* 0.65  CALCIUM 9.3 9.6   PT/INR  Recent Labs  08/12/17 0527  LABPROT 13.4  INR 1.02   CMP     Component Value Date/Time   NA 134 (L) 08/12/2017 0527   NA 135 (L) 07/26/2017 1014   K 4.5 08/12/2017 0527   K 3.3 (L) 07/26/2017 1014   CL 102 08/12/2017 0527   CO2 24 08/12/2017 0527   CO2 25 07/26/2017 1014   GLUCOSE 101 (H) 08/12/2017 0527   GLUCOSE 129 07/26/2017 1014   BUN 17 08/12/2017 0527   BUN 9.8 07/26/2017 1014   CREATININE 0.65 08/12/2017 0527   CREATININE 1.0 07/26/2017 1014   CALCIUM 9.6 08/12/2017 0527   CALCIUM 9.3 07/26/2017 1014   PROT 8.0 08/12/2017 0527   PROT 8.2 07/26/2017 1014   ALBUMIN 2.8 (L) 08/12/2017 0527   ALBUMIN 3.3 (L) 07/26/2017 1014   AST 22 08/12/2017 0527   AST 24 07/26/2017 1014   ALT 14 (L) 08/12/2017 0527   ALT 6 07/26/2017 1014   ALKPHOS 123 08/12/2017 0527   ALKPHOS 87 07/26/2017 1014   BILITOT 0.4 08/12/2017 0527   BILITOT 0.23 07/26/2017 1014   GFRNONAA >60 08/12/2017 0527   GFRAA >60 08/12/2017 0527   Lipase     Component Value Date/Time   LIPASE 14 07/29/2017 1122    Studies/Results: No results found.    Kalman Drape , [facility] Surgery 08/12/2017, 10:15 AM Pager: [phone] Consults: [phone] Mon-Fri 7:00 am-4:30 pm Sat-Sun 7:00 am-11:30 am

## 2017-08-12 NOTE — Progress Notes (Addendum)
PT Cancellation Note  Patient Details Name: Jeff [name] MRN: [id number] [name]: 1961-05-11   Cancelled Treatment:    Reason Eval/Treat Not Completed: Patient at procedure or test/unavailable (per nursing staff, pt to go to IR shortly; will check back tomorrow)   York Ram E 08/12/2017, 2:14 PM Carmelia Bake, PT, DPT 08/12/2017 Pager: [phone]

## 2017-08-12 NOTE — Progress Notes (Signed)
PHARMACY - ADULT TOTAL PARENTERAL NUTRITION CONSULT NOTE   Pharmacy Consult for TPN Indication: SBO  Patient Measurements: Height: 5\' 9"  (175.3 cm) Weight: 118 lb 6.2 oz (53.7 kg) IBW/kg (Calculated) : 70.7 TPN AdjBW (KG): 55.3 Body mass index is 17.48 kg/m.   Insulin Requirements:  DC'd  SSI/CBGs 8/11  Current Nutrition: clear liquid diet; TPN; ensure bid ordered on 8/15  IVF: D5NS + KCl 20 mEq/L running at 65 mL/hr  Central access: PICC TPN start date: 8/7  ASSESSMENT                                                                                                          HPI:  56 y.o.malewith medical hx significant for metastatic colon cancer, on chemo, pancreatic abscess with percutaneous drain, iron deficiency anemia, and hx of right leg DVT, on Xarelto.  Presented to the ED for evaluation of 3 days of abdominal pain with nausea, vomiting, and anorexia. CT of the abdomen and pelvis at the time of admission revealed a bowel obstruction at the level of the descending colon at the location of the percutaneous drainage catheter.  Significant events:  8/7: Sigmoidoscopy- Malignant completely obstructing tumor in the                                    descending colon. 8/8: Transverse Loop Colostomy 8/14: starting clear liquid diet; clamp NG tube 8/15: full liquid diet 8/16: reduce TPN to 1/2 per surgery  Today:   Glucose (goal <150): no hx DM;  SSI and CBGs stopped on 8/11  Electrolytes: mag 1.6, Phos 5.1, NA 134  Renal: SCr WNL, stable  LFTs: ALT low  TGs: 445 (8/8), 29 (8/9), 83 (8/13)  Prealbumin - 6.4 (8/4), 9.8 (8/8), 5.6 (8/13)  I/O (-) 100 ml ;  colostomy 1010 ml   NUTRITIONAL GOALS                                                                                             RD recs (8/10): Kcal:  1880-2050 (33-36 kcal/kg) Protein:  85-97 grams (1.5-1.7 grams/kg)  Clinimix E 5/20 at a goal rate of 23ml/hr + 20% fat emulsion at 73ml/hr x 12 hours  to  provide: 90 g/day protein, 2064 Kcal/day.  PLAN  Mag 2gm IV x 1  At 1800 today:  reduce Clinimix E 5/20 to 79ml/hr  Continue 20% fat emulsion at 64ml/hr x 12 hours daily  TPN to contain standard multivitamins and trace elements.  continue mIVF at 65 mL/hr or per MD  BMet with mag and phos in am  TPN lab panels on Mondays & Thursdays.  F/u oral intake and ability to wean TPN  Dolly Rias RPh 08/12/2017, 10:12 AM Pager [phone]

## 2017-08-12 NOTE — Progress Notes (Signed)
Offered patient pain medication; he declined. Donne Hazel, RN

## 2017-08-12 NOTE — Progress Notes (Signed)
[facility] PROGRESS NOTE    Progress Note  DEMYAN FUGATE  XBJ:[id number] [name]: November 26, 1961 [name]: 07/29/2017 PCP: [name], MD     Brief Narrative:   Jeff [name] is an 56 y.o. male past medical history is significant for metastatic colon cancer and chemotherapy, pancreatic abscesses with percutaneous drain history of right lower showed a DVT on Xarelto presents to the ED for evaluation of abdominal pain nausea and vomiting and anorexia the started 3 days prior to admission.  CT scan of the abdomen and pelvis on admission revealed small bowel obstruction at the level of the descending colon, there was colonic thickening due to neoplasm. Mild decrease in size of multiloculated fluid collection in the splenic ileum GI, general surgery and met on been consulted. GI had an unsuccessful attempt of endoscopically stenting the colon, he is status post surgical intervention on his transverse colon and 08/04/2017, following surgeries had a prolonged ileus with NG tube in place.  Assessment/Plan:   Principal Problem:   Bowel obstruction (HCC) Active Problems:   Primary colon cancer with metastasis to other site ([place])   Anemia, iron deficiency   History of deep vein thrombosis (DVT) of lower extremity   Intra-abdominal abscess    Pancreatic abscess   Malignant neoplasm of splenic flexure (HCC)   Malnutrition of moderate degree   Severe malnutrition (HCC)   Colon obstruction (HCC)  Colonic obstruction/ileus: Likely related to to his colon cancer. GI was consulted Dr. [place] Crane will attempt to stent placement which was unsuccessful 08/03/2017. Gen. surgery was tented transfers loop colostomy bypassing the metastases on 8 07/29/2017. Patient had a postop ileus, Tolerating liquid diet. Further management per surgery.  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 Impression  related to the previous attending physician that was #1 has resolved he would like to continue further chemotherapy.  Pancreatic abscess: The percutaneous drain placement 07/19/2017, IR to continue further management. Cultures on 07/11/2017 showed Bacteroides. Interventional radiology perform drain study on 08/09/2017, showed the abscess was decompressed, possibly draining to the pancreatic duct. For drain exchange been on 08/12/2017.  DVT of the right lower extremity: Diagnosis on 02/17/2017. Surgery to recommend when to start Xarelto. Heparin had to be stopped on 08/11/2017 due to bleeding.  Pancytopenia due to chemotherapy: Remained stable continue heparin.  Severe protein clinic malnutrition: Malnutrition Type:  Nutrition Problem: Malnutrition (severe) Etiology: chronic illness, catabolic illness, cancer and cancer related treatments   Malnutrition Characteristics:  Signs/Symptoms: severe depletion of muscle mass, severe depletion of body fat, percent weight loss   Nutrition Interventions:  Interventions: TPN   DVT prophylaxis: heaprin Family Communication:none Disposition Plan/Barrier to D/C: unable to determine Code Status:     Code Status Orders        Start     Ordered   07/29/17 1737  Full code  Continuous     07/29/17 1738    Code Status History    Date Active Date Inactive Code Status Order ID Comments User Context   07/10/2017 12:41 AM 07/14/2017  4:35 PM Full Code [id number]  Ivor Costa, MD ED   01/11/2017  1:50 AM 01/19/2017  8:08 PM Full Code [id number]  Etta Quill, DO ED        IV Access:    Peripheral IV   Procedures and diagnostic studies:   No results found.   Medical Consultants:    None.  Anti-Infectives:   None  Subjective:    Lazaro Isenhower Styer no complains.  Objective:    Vitals:   08/11/17 0600 08/11/17 1400 08/11/17 2211 08/12/17 0519  BP: 128/79 124/70 118/74 118/80  Pulse: 92 (!) 108 (!) 111 (!) 102    Resp: 18 18 18 18   Temp: 98.5 F (36.9 C) 99.3 F (37.4 C) 98.5 F (36.9 C) 98.8 F (37.1 C)  TempSrc: Oral Oral Oral Oral  SpO2: 100% 99% 100% 100%  Weight:    53.7 kg (118 lb 6.2 oz)  Height:        Intake/Output Summary (Last 24 hours) at 08/12/17 0824 Last data filed at 08/12/17 5726  Gross per 24 hour  Intake          3460.75 ml  Output             4152 ml  Net          -691.25 ml   Filed Weights   08/03/17 0500 08/06/17 0500 08/12/17 0519  Weight: 59.2 kg (130 lb 8.2 oz) 64.4 kg (141 lb 15.6 oz) 53.7 kg (118 lb 6.2 oz)    Exam: General exam: Cachectic, no acute distress and stoic. Respiratory system: Good air movement and clear to auscultation. Cardiovascular system: Regular rate and rhythm with positive S1-S2. Gastrointestinal system: Abdomen soft nontender nondistended. Central nervous system: Awake alert and oriented 3. Extremities: No lower extremity edema. Skin: No rashes. Psychiatry: Judgment and insight appeared normal   Data Reviewed:    Labs: Basic Metabolic Panel:  Recent Labs Lab 08/06/17 0539 08/07/17 0550 08/08/17 0601 08/09/17 0523 08/10/17 0531 08/11/17 0835 08/12/17 0527  NA 129* 130* 133* 132* 133* 133* 134*  K 4.1 4.3 4.1 4.5 4.7 5.2* 4.5  CL 100* 98* 98* 101 100* 101 102  CO2 22 23 26 25 24 25 24   GLUCOSE 124* 123* 130* 133* 125* 117* 101*  BUN 14 14 14 13 13 15 17   CREATININE 0.72 0.60* 0.55* 0.66 0.64 0.60* 0.65  CALCIUM 8.4* 8.3* 8.5* 8.6* 9.1 9.3 9.6  MG 1.6* 1.6* 1.8 1.7 1.7 1.6* 1.6*  PHOS 3.3 3.7  --  4.7*  --   --  5.1*   GFR Estimated Creatinine Clearance: 76.4 mL/min (by C-G formula based on SCr of 0.65 mg/dL). Liver Function Tests:  Recent Labs Lab 08/06/17 0539 08/09/17 0523 08/10/17 0531 08/12/17 0527  AST 20 19 30 22   ALT 7* 7* 13* 14*  ALKPHOS 45 64 98 123  BILITOT 0.2* 0.4 0.3 0.4  PROT 6.6 7.1 7.5 8.0  ALBUMIN 2.6* 2.6* 2.7* 2.8*   No results for input(s): LIPASE, AMYLASE in the last 168  hours. No results for input(s): AMMONIA in the last 168 hours. Coagulation profile  Recent Labs Lab 08/12/17 0527  INR 1.02    CBC:  Recent Labs Lab 08/09/17 0523 08/10/17 0531 08/11/17 0155 08/11/17 0835 08/12/17 0527  WBC 7.4 7.3 8.6 6.9 7.3  NEUTROABS 5.2  --   --   --  4.7  HGB 8.4* 8.3* 8.2* 8.1* 8.6*  HCT 24.8* 25.5* 24.6* 24.5* 26.5*  MCV 84.6 85.0 84.8 85.7 85.5  PLT 100* 116* 123* 125* 151   Cardiac Enzymes: No results for input(s): CKTOTAL, CKMB, CKMBINDEX, TROPONINI in the last 168 hours. BNP (last 3 results) No results for input(s): PROBNP in the last 8760 hours. CBG:  Recent Labs Lab 08/06/17 0553 08/06/17 1312 08/06/17 1802 08/06/17 2331 08/07/17 0535  GLUCAP 137* 117* 107* 129* 124*   D-Dimer: No  results for input(s): DDIMER in the last 72 hours. Hgb A1c: No results for input(s): HGBA1C in the last 72 hours. Lipid Profile: No results for input(s): CHOL, HDL, LDLCALC, TRIG, CHOLHDL, LDLDIRECT in the last 72 hours. Thyroid function studies: No results for input(s): TSH, T4TOTAL, T3FREE, THYROIDAB in the last 72 hours.  Invalid input(s): FREET3 Anemia work up: No results for input(s): VITAMINB12, FOLATE, FERRITIN, TIBC, IRON, RETICCTPCT in the last 72 hours. Sepsis Labs:  Recent Labs Lab 08/10/17 0531 08/11/17 0155 08/11/17 0835 08/12/17 0527  WBC 7.3 8.6 6.9 7.3   Microbiology No results found for this or any previous visit (from the past 240 hour(s)).   Medications:   . Chlorhexidine Gluconate Cloth  6 each Topical Q0600  . feeding supplement (ENSURE ENLIVE)  237 mL Oral BID BM  . pantoprazole (PROTONIX) IV  40 mg Intravenous Q12H  . sodium chloride flush  10-40 mL Intracatheter Q12H   Continuous Infusions: . sodium chloride 75 mL/hr at 08/12/17 0600  . Marland KitchenTPN (CLINIMIX-E) Adult 75 mL/hr at 08/12/17 0600      LOS: 14 days   Charlynne Cousins  [facility] Pager [phone]  *Please refer to [email or web address], password  [email or web address] to get updated schedule on who will round on this patient, as hospitalists switch teams weekly. If 7PM-7AM, please contact night-coverage at [email or web address], password [email or web address] for any overnight needs.  08/12/2017, 8:24 AM

## 2017-08-13 ENCOUNTER — Telehealth: Payer: Self-pay

## 2017-08-13 DIAGNOSIS — K859 Acute pancreatitis without necrosis or infection, unspecified: Secondary | ICD-10-CM

## 2017-08-13 DIAGNOSIS — K56609 Unspecified intestinal obstruction, unspecified as to partial versus complete obstruction: Secondary | ICD-10-CM

## 2017-08-13 LAB — BASIC METABOLIC PANEL
ANION GAP: 7 (ref 5–15)
BUN: 13 mg/dL (ref 6–20)
CALCIUM: 9.4 mg/dL (ref 8.9–10.3)
CO2: 24 mmol/L (ref 22–32)
Chloride: 101 mmol/L (ref 101–111)
Creatinine, Ser: 0.6 mg/dL — ABNORMAL LOW (ref 0.61–1.24)
GFR calc Af Amer: >60 mL/min (ref >60)
GLUCOSE: 114 mg/dL — AB (ref 65–99)
POTASSIUM: 4.4 mmol/L (ref 3.5–5.1)
SODIUM: 132 mmol/L — AB (ref 135–145)

## 2017-08-13 LAB — CBC
HEMATOCRIT: 27.5 % — AB (ref 39.0–52.0)
HEMOGLOBIN: 9.1 g/dL — AB (ref 13.0–17.0)
MCH: 28 pg (ref 26.0–34.0)
MCHC: 33.1 g/dL (ref 30.0–36.0)
MCV: 84.6 fL (ref 78.0–100.0)
Platelets: 171 10*3/uL (ref 150–400)
RBC: 3.25 MIL/uL — ABNORMAL LOW (ref 4.22–5.81)
RDW: 14.5 % (ref 11.5–15.5)
WBC: 6.4 10*3/uL (ref 4.0–10.5)

## 2017-08-13 LAB — PHOSPHORUS: PHOSPHORUS: 4.2 mg/dL (ref 2.5–4.6)

## 2017-08-13 LAB — MAGNESIUM: MAGNESIUM: 1.6 mg/dL — AB (ref 1.7–2.4)

## 2017-08-13 MED ORDER — FAT EMULSION 20 % IV EMUL
240.0000 mL | INTRAVENOUS | Status: AC
  Filled 2017-08-13: qty 250

## 2017-08-13 MED ORDER — MAGNESIUM OXIDE 400 (241.3 MG) MG PO TABS
400.0000 mg | ORAL_TABLET | Freq: Two times a day (BID) | ORAL | Status: AC
  Administered 2017-08-13 (×2): 400 mg via ORAL
  Filled 2017-08-13 (×2): qty 1

## 2017-08-13 MED ORDER — MAGNESIUM SULFATE 2 GM/50ML IV SOLN
2.0000 g | Freq: Once | INTRAVENOUS | Status: AC
  Administered 2017-08-13: 2 g via INTRAVENOUS
  Filled 2017-08-13: qty 50

## 2017-08-13 MED ORDER — TRACE MINERALS CR-CU-MN-SE-ZN 10-1000-500-60 MCG/ML IV SOLN
INTRAVENOUS | Status: AC
  Administered 2017-08-13: 17:00:00 via INTRAVENOUS
  Filled 2017-08-13: qty 960

## 2017-08-13 NOTE — Telephone Encounter (Signed)
Jeff [name] daughter is calling to talk with Dr Burr Medico about her father's condition. She mentioned that Dr Burr Medico had told her dad that the cancer is spreading and she wants to know what the plan may be. Her cell [phone]

## 2017-08-13 NOTE — Progress Notes (Addendum)
[facility] PROGRESS NOTE    Progress Note  Jeff [name]  FBP:[id number] [name]: 1961/02/14 [name]: 07/29/2017 PCP: [name], MD     Brief Narrative:   Jeff [name] is an 56 y.o. male past medical history is significant for metastatic colon cancer and chemotherapy, pancreatic abscesses with percutaneous drain history of right lower showed a DVT on Xarelto presents to the ED for evaluation of abdominal pain nausea and vomiting and anorexia the started 3 days prior to admission.  CT scan of the abdomen and pelvis on admission revealed small bowel obstruction at the level of the descending colon, there was colonic thickening due to neoplasm. Mild decrease in size of multiloculated fluid collection in the splenic ileum GI, general surgery and met on been consulted. GI had an unsuccessful attempt of endoscopically stenting the colon, he is status post surgical intervention on his transverse colon and 08/04/2017, following surgeries had a prolonged ileus with NG tube in place.  Assessment/Plan:   Principal Problem:   Bowel obstruction (HCC) Active Problems:   Primary colon cancer with metastasis to other site ([place])   Anemia, iron deficiency   History of deep vein thrombosis (DVT) of lower extremity   Intra-abdominal abscess    Pancreatic abscess   Malignant neoplasm of splenic flexure (HCC)   Malnutrition of moderate degree   Severe malnutrition (HCC)   Colon obstruction (HCC)  Colonic obstruction/ileus: Likely related to to his colon cancer. GI was consulted [name] attempted stent placement which was unsuccessful 08/03/2017. Gen. surgery was tented transfers loop colostomy bypassing the metastases on 8 07/29/2017. Patient had a postop ileus, Tolerating liquid diet. Further management per surgery. High ostomy output last 24 hours. Patient did not ambulate with physical therapy on 08/12/2017. We'll reconsult physical therapy. Pt is tired and would like to speak to Palliative  care.  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 Impression related to the previous attending physician that was #1 has resolved he would like to continue further chemotherapy.  Pancreatic abscess: The percutaneous drain placement 07/19/2017, IR to continue further management. Cultures on 07/11/2017 showed Bacteroides ovatus. He has been of antibiotics since 8.12.2018 Interventional radiology perform drain study on 08/09/2017, showed the abscess was decompressed, possibly draining to the pancreatic duct. Successful drain flushed. Contrast injection titrate a small abscess cavity but no tract identified. CT scan of the abdomen and pelvis with IV contrast showed no abscesses adjacent to the pigtail, but I did 5.0 x 3.4 cm multi loculated fluid collection is again noted between the pancreatic tail, avoiding IR recommendations.  DVT of the right lower extremity: Diagnosis on 02/17/2017. Surgery to recommend when to start Xarelto. Heparin had to be stopped on 08/11/2017 due to bleeding.  Pancytopenia due to chemotherapy: Remained stable continue to hold heparin. As he was oozing from the incision site.  Hypomagnesemia: Replete orally recheck in the morning.  Mild hyponatremia: Seems to be euvolemic on physical exam will continue to trend.   Severe protein clinic malnutrition: Malnutrition Type:  Nutrition Problem: Malnutrition (severe) Etiology: chronic illness, catabolic illness, cancer and cancer related treatments   Malnutrition Characteristics:  Signs/Symptoms: severe depletion of muscle mass, severe depletion of body fat, percent weight loss   Nutrition Interventions:  Interventions: Ensure Enlive (each supplement provides 350kcal and 20 grams of protein), TPN   DVT prophylaxis: heaprin Family Communication:none Disposition Plan/Barrier to D/C: unable to  determine Code Status:  Code Status Orders        Start     Ordered   07/29/17 1737  Full code  Continuous     07/29/17 1738    Code Status History    Date Active Date Inactive Code Status Order ID Comments User Context   07/10/2017 12:41 AM 07/14/2017  4:35 PM Full Code [id number]  Niu, Xilin, MD ED   01/11/2017  1:50 AM 01/19/2017  8:08 PM Full Code [id number]  Gardner, Jared M, DO ED        IV Access:    Peripheral IV   Procedures and diagnostic studies:   Ct Abdomen Pelvis W Contrast  Result Date: 08/13/2017 CLINICAL DATA:  History of metastatic colon cancer. Status post abscess drainage. EXAM: CT ABDOMEN AND PELVIS WITH CONTRAST TECHNIQUE: Multidetector CT imaging of the abdomen and pelvis was performed using the standard protocol following bolus administration of intravenous contrast. CONTRAST:  80mL ISOVUE-300 IOPAMIDOL (ISOVUE-300) INJECTION 61% COMPARISON:  CT scan of July 29, 2017. FINDINGS: Lower chest: No acute abnormality. Hepatobiliary: No focal liver abnormality is seen. No gallstones, gallbladder wall thickening, or biliary dilatation. Pancreas: Unremarkable. No pancreatic ductal dilatation or surrounding inflammatory changes. Spleen: 5.0 x 3.4 cm multiloculated fluid collection is seen between the pancreatic tail and spleen which is not significantly changed compared to prior exam. Splenic parenchyma appears normal. Adrenals/Urinary Tract: Adrenal glands are unremarkable. Kidneys are normal, without renal calculi, focal lesion, or hydronephrosis. Bladder is unremarkable. Stomach/Bowel: There is interval placement of what appears to be a colostomy in the right upper quadrant of the abdomen. The stomach is unremarkable. Air-filled appendix is noted. Mild wall thickening of several loops of small bowel is noted without significant dilatation. This is concerning for some form of enteritis. Vascular/Lymphatic: Aortic atherosclerosis. No enlarged abdominal or pelvic lymph  nodes. Reproductive: Stable prostatic enlargement. Other: Pigtail drainage catheter is again noted inferior to the spleen. Fluid collection appears to have resolved. Musculoskeletal: No acute or significant osseous findings. IMPRESSION: Aortic atherosclerosis. Interval placement of colostomy in the right upper quadrant of the abdomen. No significant bowel dilatation is seen at this time. However, diffuse wall thickening is seen involving several loops of small bowel concerning for inflammation or edema. Pigtail drainage catheter is again noted inferior to the spleen, and fluid collection appears to have resolved. 5.0 x 3.4 cm multi loculated fluid collection is again noted between the pancreatic tail and spleen which is not significantly changed compared to prior exam. Electronically Signed   By: James  Green Jr, M.D.   On: 08/13/2017 08:30   Ir Sinus/fist Tube Chk-non Gi  Result Date: 08/12/2017 INDICATION: 56-year-old with complex medical history including stage IV colon cancer and malignant perforation and abscess. Previous drain injection demonstrated a sinus tract to the pancreatic duct. Recently, the catheter has not been flushing. Plan for additional evaluation of the drain with fluoroscopy and possible exchange. EXAM: SINUS TRACT INJECTION / FISTULOGRAM COMPARISON:  None. MEDICATIONS: None ANESTHESIA/SEDATION: None COMPLICATIONS: None immediate. TECHNIQUE: Drain was disconnected from the suction bulb. The drain was successfully flushed with a 3 ml syringe of normal saline. Subsequently, the drain was injected under fluoroscopy. Drain was flushed at the end of the procedure and attached to the suction bulb. PROCEDURE: Drain injection with fluoroscopy. FINDINGS: Pigtail drain remains in the left abdomen and unchanged in position. Again noted is filling of a small cavity near the drain. Contrast was also draining back onto the skin surface. Unable to identify a fistula tract   to the pancreas on this  examination. IMPRESSION: Drain was successfully flushed with saline. Drain injection again confirmed a small collection around the tube. Unable to identify a fistula tract on these images. Recommend repeat CT imaging to look for residual fluid collections in this area and discuss drain removal with General Surgery. Electronically Signed   By: Adam  Henn M.D.   On: 08/12/2017 15:54     Medical Consultants:    None.  Anti-Infectives:   None  Subjective:    Jeff [name] no new complaints,  Objective:    Vitals:   08/12/17 2135 08/13/17 0200 08/13/17 0547 08/13/17 0912  BP: 122/80 106/66 106/71 98/69  Pulse: (!) 101 99 (!) 109 (!) 113  Resp: 18 18 18 18  Temp: 98.7 F (37.1 C) 98.2 F (36.8 C) 98.2 F (36.8 C) 99.2 F (37.3 C)  TempSrc: Oral Oral Oral Oral  SpO2: 100% 100% 99% 100%  Weight:      Height:        Intake/Output Summary (Last 24 hours) at 08/13/17 1127 Last data filed at 08/13/17 0913  Gross per 24 hour  Intake          1076.83 ml  Output             3500 ml  Net         -2423.17 ml   Filed Weights   08/03/17 0500 08/06/17 0500 08/12/17 0519  Weight: 59.2 kg (130 lb 8.2 oz) 64.4 kg (141 lb 15.6 oz) 53.7 kg (118 lb 6.2 oz)    Exam: General exam: Cachectic, no acute distress and stoic. Respiratory system: Good air movement and clear to auscultation. Cardiovascular system: Regular rate and rhythm with positive S1-S2. Gastrointestinal system: Abdomen is soft with gauze over the incision site. No further oozing. Central nervous system: Awake alert and oriented 3. Extremities: No lower extremity edema. Skin: No rashes. Psychiatry: Judgment and insight appeared normal   Data Reviewed:    Labs: Basic Metabolic Panel:  Recent Labs Lab 08/07/17 0550  08/09/17 0523 08/10/17 0531 08/11/17 0835 08/12/17 0527 08/13/17 0526  NA 130*  < > 132* 133* 133* 134* 132*  K 4.3  < > 4.5 4.7 5.2* 4.5 4.4  CL 98*  < > 101 100* 101 102 101  CO2 23  < >  25 24 25 24 24  GLUCOSE 123*  < > 133* 125* 117* 101* 114*  BUN 14  < > 13 13 15 17 13  CREATININE 0.60*  < > 0.66 0.64 0.60* 0.65 0.60*  CALCIUM 8.3*  < > 8.6* 9.1 9.3 9.6 9.4  MG 1.6*  < > 1.7 1.7 1.6* 1.6* 1.6*  PHOS 3.7  --  4.7*  --   --  5.1* 4.2  < > = values in this interval not displayed. GFR Estimated Creatinine Clearance: 76.4 mL/min (A) (by C-G formula based on SCr of 0.6 mg/dL (L)). Liver Function Tests:  Recent Labs Lab 08/09/17 0523 08/10/17 0531 08/12/17 0527  AST 19 30 22  ALT 7* 13* 14*  ALKPHOS 64 98 123  BILITOT 0.4 0.3 0.4  PROT 7.1 7.5 8.0  ALBUMIN 2.6* 2.7* 2.8*   No results for input(s): LIPASE, AMYLASE in the last 168 hours. No results for input(s): AMMONIA in the last 168 hours. Coagulation profile  Recent Labs Lab 08/12/17 0527  INR 1.02    CBC:  Recent Labs Lab 08/09/17 0523 08/10/17 0531 08/11/17 0155 08/11/17 0835 08/12/17 0527  WBC   7.4 7.3 8.6 6.9 7.3  NEUTROABS 5.2  --   --   --  4.7  HGB 8.4* 8.3* 8.2* 8.1* 8.6*  HCT 24.8* 25.5* 24.6* 24.5* 26.5*  MCV 84.6 85.0 84.8 85.7 85.5  PLT 100* 116* 123* 125* 151   Cardiac Enzymes: No results for input(s): CKTOTAL, CKMB, CKMBINDEX, TROPONINI in the last 168 hours. BNP (last 3 results) No results for input(s): PROBNP in the last 8760 hours. CBG:  Recent Labs Lab 08/06/17 1312 08/06/17 1802 08/06/17 2331 08/07/17 0535  GLUCAP 117* 107* 129* 124*   D-Dimer: No results for input(s): DDIMER in the last 72 hours. Hgb A1c: No results for input(s): HGBA1C in the last 72 hours. Lipid Profile: No results for input(s): CHOL, HDL, LDLCALC, TRIG, CHOLHDL, LDLDIRECT in the last 72 hours. Thyroid function studies: No results for input(s): TSH, T4TOTAL, T3FREE, THYROIDAB in the last 72 hours.  Invalid input(s): FREET3 Anemia work up: No results for input(s): VITAMINB12, FOLATE, FERRITIN, TIBC, IRON, RETICCTPCT in the last 72 hours. Sepsis Labs:  Recent Labs Lab 08/10/17 0531  08/11/17 0155 08/11/17 0835 08/12/17 0527  WBC 7.3 8.6 6.9 7.3   Microbiology No results found for this or any previous visit (from the past 240 hour(s)).   Medications:   . Chlorhexidine Gluconate Cloth  6 each Topical Q0600  . feeding supplement (ENSURE ENLIVE)  237 mL Oral BID BM  . pantoprazole (PROTONIX) IV  40 mg Intravenous Q12H  . sodium chloride flush  10-40 mL Intracatheter Q12H   Continuous Infusions: . .TPN (CLINIMIX-E) Adult     And  . fat emulsion    . .TPN (CLINIMIX-E) Adult 40 mL/hr at 08/13/17 0200      LOS: 15 days   Jeff [name], Jeff [name]  [facility] Pager [phone]  *Please refer to [email or web address], password [email or web address] to get updated schedule on who will round on this patient, as hospitalists switch teams weekly. If 7PM-7AM, please contact night-coverage at [email or web address], password [email or web address] for any overnight needs.  08/13/2017, 11:27 AM        

## 2017-08-13 NOTE — Progress Notes (Signed)
Jeff [name]   [name]:1961/11/18   JS#:[id number]   YIF#:[id number]  ONCOLOGY FOLLOW UP NOTE   Subjective: I saw him for follow up today, he is recovering slowly from surgery, NG tube removed, abdominal pain has improved, eats small amount, colostomy output good. He feels very fatigued, has not been able to do much physical therapy due to his weakness and intermittent dizziness. He is able to walk for short distance, he was sitting in the chair when I saw him.  Objective:  Vitals:   08/13/17 0912 08/13/17 1446  BP: 98/69 136/87  Pulse: (!) 113 (!) 102  Resp: 18 18  Temp: 99.2 F (37.3 C) 98.3 F (36.8 C)  SpO2: 100% 100%    Body mass index is 17.48 kg/m.  Intake/Output Summary (Last 24 hours) at 08/13/17 1652 Last data filed at 08/13/17 1447  Gross per 24 hour  Intake             1010 ml  Output             3750 ml  Net            -2740 ml     Sclerae unicteric  Oropharynx clear  No peripheral adenopathy  Lungs clear -- no rales or rhonchi  Heart regular rate and rhythm  Abdomen benign, mild tenderness at left side, (+) percutaneous drainage tube on left side.  MSK no focal spinal tenderness, no peripheral edema  Neuro nonfocal   CBG (last 3)  No results for input(s): GLUCAP in the last 72 hours.   Labs:  Urine Studies No results for input(s): UHGB, CRYS in the last 72 hours.  Invalid input(s): UACOL, UAPR, USPG, UPH, UTP, UGL, UKET, UBIL, UNIT, UROB, [place], UEPI, UWBC, Junie Panning [place], [place], [place]  Basic Metabolic Panel:  Recent Labs Lab 08/07/17 0550  08/09/17 0523 08/10/17 0531 08/11/17 0835 08/12/17 0527 08/13/17 0526  NA 130*  < > 132* 133* 133* 134* 132*  K 4.3  < > 4.5 4.7 5.2* 4.5 4.4  CL 98*  < > 101 100* 101 102 101  CO2 23  < > 25 24 25 24 24   GLUCOSE 123*  < > 133* 125* 117* 101* 114*  BUN 14  < > 13 13 15 17 13   CREATININE 0.60*  < > 0.66 0.64 0.60* 0.65 0.60*  CALCIUM 8.3*  < > 8.6* 9.1 9.3 9.6 9.4  MG 1.6*  < > 1.7 1.7 1.6* 1.6* 1.6*  PHOS  3.7  --  4.7*  --   --  5.1* 4.2  < > = values in this interval not displayed. GFR Estimated Creatinine Clearance: 76.4 mL/min (A) (by C-G formula based on SCr of 0.6 mg/dL (L)). Liver Function Tests:  Recent Labs Lab 08/09/17 0523 08/10/17 0531 08/12/17 0527  AST 19 30 22   ALT 7* 13* 14*  ALKPHOS 64 98 123  BILITOT 0.4 0.3 0.4  PROT 7.1 7.5 8.0  ALBUMIN 2.6* 2.7* 2.8*   No results for input(s): LIPASE, AMYLASE in the last 168 hours. No results for input(s): AMMONIA in the last 168 hours. Coagulation profile  Recent Labs Lab 08/12/17 0527  INR 1.02    CBC:  Recent Labs Lab 08/09/17 0523 08/10/17 0531 08/11/17 0155 08/11/17 0835 08/12/17 0527 08/13/17 1133  WBC 7.4 7.3 8.6 6.9 7.3 6.4  NEUTROABS 5.2  --   --   --  4.7  --   HGB 8.4* 8.3* 8.2* 8.1* 8.6* 9.1*  HCT  24.8* 25.5* 24.6* 24.5* 26.5* 27.5*  MCV 84.6 85.0 84.8 85.7 85.5 84.6  PLT 100* 116* 123* 125* 151 171   Cardiac Enzymes: No results for input(s): CKTOTAL, CKMB, CKMBINDEX, TROPONINI in the last 168 hours. BNP: Invalid input(s): POCBNP CBG:  Recent Labs Lab 08/06/17 1802 08/06/17 2331 08/07/17 0535  GLUCAP 107* 129* 124*   D-Dimer No results for input(s): DDIMER in the last 72 hours. Hgb A1c No results for input(s): HGBA1C in the last 72 hours. Lipid Profile No results for input(s): CHOL, HDL, LDLCALC, TRIG, CHOLHDL, LDLDIRECT in the last 72 hours. Thyroid function studies No results for input(s): TSH, T4TOTAL, T3FREE, THYROIDAB in the last 72 hours.  Invalid input(s): FREET3 Anemia work up No results for input(s): VITAMINB12, FOLATE, FERRITIN, TIBC, IRON, RETICCTPCT in the last 72 hours. Microbiology No results found for this or any previous visit (from the past 240 hour(s)).    Studies:  Ct Abdomen Pelvis W Contrast  Result Date: 08/13/2017 CLINICAL DATA:  History of metastatic colon cancer. Status post abscess drainage. EXAM: CT ABDOMEN AND PELVIS WITH CONTRAST TECHNIQUE:  Multidetector CT imaging of the abdomen and pelvis was performed using the standard protocol following bolus administration of intravenous contrast. CONTRAST:  70mL ISOVUE-300 IOPAMIDOL (ISOVUE-300) INJECTION 61% COMPARISON:  CT scan of July 29, 2017. FINDINGS: Lower chest: No acute abnormality. Hepatobiliary: No focal liver abnormality is seen. No gallstones, gallbladder wall thickening, or biliary dilatation. Pancreas: Unremarkable. No pancreatic ductal dilatation or surrounding inflammatory changes. Spleen: 5.0 x 3.4 cm multiloculated fluid collection is seen between the pancreatic tail and spleen which is not significantly changed compared to prior exam. Splenic parenchyma appears normal. Adrenals/Urinary Tract: Adrenal glands are unremarkable. Kidneys are normal, without renal calculi, focal lesion, or hydronephrosis. Bladder is unremarkable. Stomach/Bowel: There is interval placement of what appears to be a colostomy in the right upper quadrant of the abdomen. The stomach is unremarkable. Air-filled appendix is noted. Mild wall thickening of several loops of small bowel is noted without significant dilatation. This is concerning for some form of enteritis. Vascular/Lymphatic: Aortic atherosclerosis. No enlarged abdominal or pelvic lymph nodes. Reproductive: Stable prostatic enlargement. Other: Pigtail drainage catheter is again noted inferior to the spleen. Fluid collection appears to have resolved. Musculoskeletal: No acute or significant osseous findings. IMPRESSION: Aortic atherosclerosis. Interval placement of colostomy in the right upper quadrant of the abdomen. No significant bowel dilatation is seen at this time. However, diffuse wall thickening is seen involving several loops of small bowel concerning for inflammation or edema. Pigtail drainage catheter is again noted inferior to the spleen, and fluid collection appears to have resolved. 5.0 x 3.4 cm multi loculated fluid collection is again noted  between the pancreatic tail and spleen which is not significantly changed compared to prior exam. Electronically Signed   By: Marijo Conception, M.D.   On: 08/13/2017 08:30   Ir Sinus/fist Tube Chk-non Gi  Result Date: 08/12/2017 INDICATION: 56 year old with complex medical history including stage IV colon cancer and malignant perforation and abscess. Previous drain injection demonstrated a sinus tract to the pancreatic duct. Recently, the catheter has not been flushing. Plan for additional evaluation of the drain with fluoroscopy and possible exchange. EXAM: SINUS TRACT INJECTION / FISTULOGRAM COMPARISON:  None. MEDICATIONS: None ANESTHESIA/SEDATION: None COMPLICATIONS: None immediate. TECHNIQUE: Drain was disconnected from the suction bulb. The drain was successfully flushed with a 3 ml syringe of normal saline. Subsequently, the drain was injected under fluoroscopy. Drain was flushed at the end of  the procedure and attached to the suction bulb. PROCEDURE: Drain injection with fluoroscopy. FINDINGS: Pigtail drain remains in the left abdomen and unchanged in position. Again noted is filling of a small cavity near the drain. Contrast was also draining back onto the skin surface. Unable to identify a fistula tract to the pancreas on this examination. IMPRESSION: Drain was successfully flushed with saline. Drain injection again confirmed a small collection around the tube. Unable to identify a fistula tract on these images. Recommend repeat CT imaging to look for residual fluid collections in this area and discuss drain removal with General Surgery. Electronically Signed   By: Markus Daft M.D.   On: 08/12/2017 15:54    Assessment: 56 y.o. with metastatic colon cancer, on palliative chemotherapy FOLFOX and panitumumab, last cycle on 07/06/2017, recent admission for abscess in the pancreatic tail, now admitted for bowel obstruction  1. Bowel obstruction due to his colon mass at the splenic flexure and surrounding  inflammation due to recent abscess, s/u diverting colostomy  2. Pancreatic tail abscess, status post drain 3. Metastatic adenocarcinoma of descending colon, with peritoneal metastasis, on palliative chemotherapy, on hold for now  4. History of DVT in right lower extremity, on A/C  5. 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 Moderate protein calorie malnutrition 7. Deconditioning  Plan: -His postoperative recovery has been slow, certainly related to his malnutrition and underlying malignancy. -f/u with IR for his LUQ drainage tube exchange or removal  -due to his deconditioning and poor PS, he will not be a candidate for chemo unless he recovers reasonably well, which requires aggressive nutritional support and PT/OT. Due to his recent abscess, his risk of chemo is pretty high.  -I encourage him to try PT/OT and take nutritional supplement to help with recovery  -If he remains to be a poor candidate for PT, then palliative care alone and hospice will be very reasonable. He unfortunately is homeless, pt and his daughter would like him to go to rehab if possible  -I will follow alone. I spoke with palliative care Dr. Domingo Cocking also today .   [name], MD 08/13/2017  4:52 PM  Cell: [phone]

## 2017-08-13 NOTE — Consult Note (Signed)
[facility] Nurse ostomy follow up  Stoma type/location: RUQ, loop diverting ostomy Stomal assessment/size: slightly oval, minimally budded, pink, moist and producing soft tan stool Peristomal assessment: intact  Treatment options for stomal/peristomal skin: 2"barrier ring used because of minimally budded stoma Output dark, green stool, liquid Ostomy pouching: 1pc with 2" barrier ring.  Education provided:  No family present in room today and emotional support and encouragement offered to engage patient in his care.  The patient agrees to empty his pouch and does so with hands on assist from me.  He is able to apply pouch once cut.  He is able to open and close roll closure. He understands that he will empty when 1/3 full and change 2-3 times weekly.   [facility] team will follow and remain available to patient, medical and nursing teams.  Domenic Moras RN BSN [place] Pager [phone]

## 2017-08-13 NOTE — Progress Notes (Signed)
PHARMACY - ADULT TOTAL PARENTERAL NUTRITION CONSULT NOTE   Pharmacy Consult for TPN Indication: SBO  Patient Measurements: Height: 5\' 9"  (175.3 cm) Weight: 118 lb 6.2 oz (53.7 kg) IBW/kg (Calculated) : 70.7 TPN AdjBW (KG): 55.3 Body mass index is 17.48 kg/m.   Insulin Requirements:  DC'd  SSI/CBGs 8/11  Current Nutrition: regular diet; TPN; ensure bid ordered on 8/15  IVF: D5NS + KCl 20 mEq/L running at 65 mL/hr  Central access: PICC TPN start date: 8/7  ASSESSMENT                                                                                                          HPI:  56 y.o.malewith medical hx significant for metastatic colon cancer, on chemo, pancreatic abscess with percutaneous drain, iron deficiency anemia, and hx of right leg DVT, on Xarelto.  Presented to the ED for evaluation of 3 days of abdominal pain with nausea, vomiting, and anorexia. CT of the abdomen and pelvis at the time of admission revealed a bowel obstruction at the level of the descending colon at the location of the percutaneous drainage catheter.  Significant events:  8/7: Sigmoidoscopy- Malignant completely obstructing tumor in the                                    descending colon. 8/8: Transverse Loop Colostomy 8/14: starting clear liquid diet; clamp NG tube 8/15: full liquid diet 8/16: reduce TPN to 1/2 per surgery, regular diet ordered 8/17: po intake minimal  Today:   Glucose (goal <150): no hx DM;  SSI and CBGs stopped on 8/11  Electrolytes: mag 1.6, Phos wnl, Na 132  Renal: SCr WNL, stable  LFTs: ALT low  TGs: 445 (8/8), 29 (8/9), 83 (8/13)  Prealbumin - 6.4 (8/4), 9.8 (8/8), 5.6 (8/13)  I/O (-) 2718 ml ;  colostomy 1800 ml   NUTRITIONAL GOALS                                                                                             RD recs (8/10): Kcal:  1880-2050 (33-36 kcal/kg) Protein:  85-97 grams (1.5-1.7 grams/kg)  Clinimix E 5/20 at a goal rate of 31ml/hr +  20% fat emulsion at 33ml/hr x 12 hours  to provide: 90 g/day protein, 2064 Kcal/day.  PLAN  Mag 2gm IV x 1  At 1800 today:  continue Clinimix E 5/20 at 32ml/hr  Continue 20% fat emulsion at 12ml/hr x 12 hours daily  TPN to contain standard multivitamins and trace elements.   mIVF per MD, none currently  BMet with mag and phos in am  TPN lab panels on Mondays & Thursdays.  F/u oral intake and ability to wean TPN  Dolly Rias RPh 08/13/2017, 11:12 AM Pager [phone]

## 2017-08-13 NOTE — Evaluation (Signed)
Physical Therapy Evaluation Patient Details Name: Jeff [name] MRN: [id number] [name]: Oct 21, 1961 Today's Date: 08/13/2017   History of Present Illness  SBO due to metastatic adenocarcinoma of colon- S/P Transverse loop colostomy 08/04/17 Dr. [name], post op ileus  Clinical Impression  Pt admitted with above diagnosis. Pt currently with functional limitations due to the deficits listed below (see PT Problem List). * Pt will benefit from skilled PT to increase their independence and safety with mobility to allow discharge to the venue listed below.   Pt will benefit from continued PT in acute setting; pt reports he has been walking with nursing staff but at time of this PT eval he is grossly unteady and only able to get up to chair, very lethargic initially; recommend SNF at this time, if progresses well and has adequate home support may be able to D/C home with HHPT     Follow Up Recommendations SNF    Equipment Recommendations  None recommended by PT    Recommendations for Other Services       Precautions / Restrictions Precautions Precautions: Fall Precaution Comments: JP drain, colostomy      Mobility  Bed Mobility Overal bed mobility: Needs Assistance Bed Mobility: Supine to Sit     Supine to sit: HOB elevated;Min guard     General bed mobility comments: incr time and min/guard for trunk  Transfers Overall transfer level: Needs assistance   Transfers: Sit to/from Stand;Stand Pivot Transfers Sit to Stand: Min assist;Mod assist Stand pivot transfers: Min assist;Mod assist       General transfer comment: repeated x 3; pt unsteady, LOB on initial stand attempt and assisted back to bed  Ambulation/Gait Ambulation/Gait assistance: Min assist;Mod assist Ambulation Distance (Feet): 3 Feet         General Gait Details: pivotal steps to chair only  Stairs            Wheelchair Mobility    Modified Rankin (Stroke Patients Only)       Balance  Overall balance assessment: Needs assistance   Sitting balance-Leahy Scale: Fair       Standing balance-Leahy Scale: Poor Standing balance comment: posterior LOB                              Pertinent Vitals/Pain Pain Assessment: No/denies pain    Home Living Family/patient expects to be discharged to:: Private residence Living Arrangements: Alone   Type of Home: Apartment Home Access: Stairs to enter   [employer] of Steps: 4          Prior Function Level of Independence: Independent         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; some info taken from previous notes, pt initially lethargic and does not answer all questions     Hand Dominance        Extremity/Trunk Assessment   Upper Extremity Assessment Upper Extremity Assessment: Defer to OT evaluation    Lower Extremity Assessment Lower Extremity Assessment: Generalized weakness       Communication   Communication: Other (comment) (sleepy)  Cognition Arousal/Alertness: Lethargic Behavior During Therapy: WFL for tasks assessed/performed Overall Cognitive Status: Impaired/Different from baseline Area of Impairment: Attention;Following commands;Problem solving                   Current Attention Level: Sustained   Following Commands: Follows one step commands with increased time;Follows multi-step commands inconsistently     Problem Solving: Slow  processing;Decreased initiation;Difficulty sequencing;Requires verbal cues        General Comments      Exercises     Assessment/Plan    PT Assessment Patient needs continued PT services  PT Problem List Decreased strength;Decreased activity tolerance;Decreased balance;Decreased mobility;Decreased knowledge of use of DME;Decreased safety awareness       PT Treatment Interventions DME instruction;Gait training;Functional mobility training;Therapeutic activities;Therapeutic exercise;Patient/family education    PT Goals  (Current goals can be found in the Care Plan section)  Acute Rehab PT Goals Patient Stated Goal: none stated PT Goal Formulation: With patient Time For Goal Achievement: 08/27/17 Potential to Achieve Goals: Good    Frequency Min 3X/week   Barriers to discharge        Co-evaluation               AM-PAC PT "6 Clicks" Daily Activity  Outcome Measure Difficulty turning over in bed (including adjusting bedclothes, sheets and blankets)?: Unable Difficulty moving from lying on back to sitting on the side of the bed? : Unable Difficulty sitting down on and standing up from a chair with arms (e.g., wheelchair, bedside commode, etc,.)?: Unable Help needed moving to and from a bed to chair (including a wheelchair)?: A Lot Help needed walking in hospital room?: A Lot Help needed climbing 3-5 steps with a railing? : A Lot 6 Click Score: 9    End of Session Equipment Utilized During Treatment: Gait belt Activity Tolerance: Patient limited by fatigue;Patient limited by lethargy Patient left: in chair;with call bell/phone within reach (bed alarm not set on arrival)   PT Visit Diagnosis: Other abnormalities of gait and mobility (R26.89);Muscle weakness (generalized) (M62.81)    Time: 7681-1572 PT Time Calculation (min) (ACUTE ONLY): 23 min   Charges:   PT Evaluation $PT Eval Moderate Complexity: 1 Mod PT Treatments $Therapeutic Activity: 8-22 mins   PT G CodesKenyon [name], PT Pager: [phone] 08/13/2017   [facility] 08/13/2017, 11:42 AM

## 2017-08-13 NOTE — Progress Notes (Signed)
[facility]/Trauma Progress Note  9 Days Post-Op   Assessment/Plan SBO due to metastatic adenocarcinoma of colon- S/P Transverse loop colostomy 08/04/17 Dr. [name] - postop ileus appears to be resolving - high ostomy output, 1863mL in last 24 hours down from yesterday  Adenocarcinoma of the colon w/ mets to spleen/pancreas - on Chemotherapy 07/08/17  abscess of the pancreatic tail- S/p perc drain; MULTIPLE ORGANISMS PRESENT, NONE PREDOMINANT, BACTEROIDES OVATUS , BETA LACTAMASE NEGATIVE on 07/11/17; Pt noted to be incurable and not seen by Dr. Barry Dienes as previously planned. - IR did drain study 08/16, recommending repeat CT scan to evaluate site of abscess DVT RLE was on xarelto, holding heparin with oozing midline abdominal wound and blood in ostomy, wound appears less oozy but still blood in ostomy Severe PCM- prealbumin 5.6 on 08/09/17; 1/2'd TPN.  FEN: TPN decreased to 33mL/hr, reg diet, protonix BID VTE: SCD's, heparin on hold ID: no abx currently Follow up: TBD  Plan: Encourage ambulation. CBC pending to check Hg.      LOS: 15 days    Subjective:  CC: S/P diverting colostomy  Pt denies pain this am. He is resting. He states he got a drain study yesterday. No new complaints.   Objective: Vital signs in last 24 hours: Temp:  [98.2 F (36.8 C)-98.7 F (37.1 C)] 98.2 F (36.8 C) (08/17 0547) Pulse Rate:  [89-109] 109 (08/17 0547) Resp:  [17-18] 18 (08/17 0547) BP: (106-122)/(60-80) 106/71 (08/17 0547) SpO2:  [98 %-100 %] 99 % (08/17 0547) Last BM Date: 08/11/17  Intake/Output from previous day: 08/16 0701 - 08/17 0700 In: 1431.8 [P.O.:295; I.V.:1136.8] Out: 4150 [Urine:2350; DUKGU:5427] Intake/Output this shift: No intake/output data recorded.  PE: Gen: Alert, NAD, pleasant, cooperative Cardio: mild tachycardia, regular rhythm, no murmur appreciated Pulm: Rate andeffort normal Abd: Soft, not distended, +BS, incision appears well  healing with no bleeding and no signs of infection, stoma purple, copious green liquid stool in ostomy with scant amount of blood, LUQ drain with scant serosanguinous drainage, no TTP Skin: no rashes noted, warm and dry  Anti-infectives: Anti-infectives    Start     Dose/Rate Route Frequency Ordered Stop   08/04/17 2200  piperacillin-tazobactam (ZOSYN) IVPB 3.375 g     3.375 g 12.5 mL/hr over 240 Minutes Intravenous Every 8 hours 08/04/17 1706 08/05/17 2244   08/04/17 0951  cefoTEtan in Dextrose 5% (CEFOTAN) 2-2.08 GM-% IVPB    Comments:  Algis Liming   : cabinet override      08/04/17 0951 08/04/17 1102   08/04/17 0945  cefoTEtan in Dextrose 5% (CEFOTAN) IVPB 2 g     2 g Intravenous  Once 08/04/17 0820 08/04/17 1117   08/04/17 0800  cefoTEtan (CEFOTAN) 2 g in dextrose 5 % 50 mL IVPB  Status:  Discontinued     2 g 100 mL/hr over 30 Minutes Intravenous On call to O.R. 08/04/17 0746 08/04/17 0819   07/29/17 2359  piperacillin-tazobactam (ZOSYN) IVPB 3.375 g  Status:  Discontinued     3.375 g 12.5 mL/hr over 240 Minutes Intravenous Every 8 hours 07/29/17 1729 08/04/17 1706   07/29/17 1730  piperacillin-tazobactam (ZOSYN) IVPB 3.375 g     3.375 g 100 mL/hr over 30 Minutes Intravenous  Once 07/29/17 1728 07/29/17 1853      Lab Results:   Recent Labs  08/11/17 0835 08/12/17 0527  WBC 6.9 7.3  HGB 8.1* 8.6*  HCT 24.5* 26.5*  PLT 125* 151   BMET  Recent Labs  08/12/17 0527 08/13/17 0526  NA 134* 132*  K 4.5 4.4  CL 102 101  CO2 24 24  GLUCOSE 101* 114*  BUN 17 13  CREATININE 0.65 0.60*  CALCIUM 9.6 9.4   PT/INR  Recent Labs  08/12/17 0527  LABPROT 13.4  INR 1.02   CMP     Component Value Date/Time   NA 132 (L) 08/13/2017 0526   NA 135 (L) 07/26/2017 1014   K 4.4 08/13/2017 0526   K 3.3 (L) 07/26/2017 1014   CL 101 08/13/2017 0526   CO2 24 08/13/2017 0526   CO2 25 07/26/2017 1014   GLUCOSE 114 (H) 08/13/2017 0526   GLUCOSE 129 07/26/2017 1014   BUN 13  08/13/2017 0526   BUN 9.8 07/26/2017 1014   CREATININE 0.60 (L) 08/13/2017 0526   CREATININE 1.0 07/26/2017 1014   CALCIUM 9.4 08/13/2017 0526   CALCIUM 9.3 07/26/2017 1014   PROT 8.0 08/12/2017 0527   PROT 8.2 07/26/2017 1014   ALBUMIN 2.8 (L) 08/12/2017 0527   ALBUMIN 3.3 (L) 07/26/2017 1014   AST 22 08/12/2017 0527   AST 24 07/26/2017 1014   ALT 14 (L) 08/12/2017 0527   ALT 6 07/26/2017 1014   ALKPHOS 123 08/12/2017 0527   ALKPHOS 87 07/26/2017 1014   BILITOT 0.4 08/12/2017 0527   BILITOT 0.23 07/26/2017 1014   GFRNONAA >60 08/13/2017 0526   GFRAA >60 08/13/2017 0526   Lipase     Component Value Date/Time   LIPASE 14 07/29/2017 1122    Studies/Results: Ir Sinus/fist Tube Chk-non Gi  Result Date: 08/12/2017 INDICATION: 56 year old with complex medical history including stage IV colon cancer and malignant perforation and abscess. Previous drain injection demonstrated a sinus tract to the pancreatic duct. Recently, the catheter has not been flushing. Plan for additional evaluation of the drain with fluoroscopy and possible exchange. EXAM: SINUS TRACT INJECTION / FISTULOGRAM COMPARISON:  None. MEDICATIONS: None ANESTHESIA/SEDATION: None COMPLICATIONS: None immediate. TECHNIQUE: Drain was disconnected from the suction bulb. The drain was successfully flushed with a 3 ml syringe of normal saline. Subsequently, the drain was injected under fluoroscopy. Drain was flushed at the end of the procedure and attached to the suction bulb. PROCEDURE: Drain injection with fluoroscopy. FINDINGS: Pigtail drain remains in the left abdomen and unchanged in position. Again noted is filling of a small cavity near the drain. Contrast was also draining back onto the skin surface. Unable to identify a fistula tract to the pancreas on this examination. IMPRESSION: Drain was successfully flushed with saline. Drain injection again confirmed a small collection around the tube. Unable to identify a fistula tract  on these images. Recommend repeat CT imaging to look for residual fluid collections in this area and discuss drain removal with General Surgery. Electronically Signed   By: Markus Daft M.D.   On: 08/12/2017 15:54      Kalman Drape , [facility] 08/13/2017, 8:31 AM Pager: [phone] Consults: [phone] Mon-Fri 7:00 am-4:30 pm Sat-Sun 7:00 am-11:30 am

## 2017-08-13 NOTE — Progress Notes (Signed)
Nutrition Follow-up  DOCUMENTATION CODES:   Severe malnutrition in context of chronic illness, Underweight  INTERVENTION:   TPN per Pharmacy Encourage PO intake  NUTRITION DIAGNOSIS:   Malnutrition (severe) related to chronic illness, catabolic illness, cancer and cancer related treatments as evidenced by severe depletion of muscle mass, severe depletion of body fat, percent weight loss.  Ongoing.  GOAL:   Patient will meet greater than or equal to 90% of their needs  Was meeting with TPN.  MONITOR:   Weight trends, Labs, Skin, I & O's, Other (Comment) (TPN regimen)  ASSESSMENT:   56 y.o. 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 Patient reports that he was in his usual state until approximately 3 days ago when he noted the fairly sudden onset of pain in the mid abdomen with nausea. He went on to develop nonbloody nonbilious vomiting, had a couple loose stools, and has had loss of appetite.  TPN now at half rate, Clinimix E 5/20 @ 40 w/ 20% ILE @ 20 ml/hr x 12 hours. Now on regular diet and had just ordered breakfast this morning. Will monitor PO intakes. Pt is drinking some of his Ensure supplements. Having CT scan today.  Medications: IV Protonix BID, IV Mg sulfate  Labs reviewed: Low Na, Mg  Pharmacy note pending.  Diet Order:  TPN (CLINIMIX-E) Adult Diet regular Room service appropriate? Yes; Fluid consistency: Thin  Skin:  Wound (see comment) (Abdominal incision from 8/8)  Last BM:  8/15  Height:   Ht Readings from Last 1 Encounters:  07/31/17 5\' 9"  (1.753 m)    Weight:   Wt Readings from Last 1 Encounters:  08/12/17 118 lb 6.2 oz (53.7 kg)    Ideal Body Weight:  72.73 kg  BMI:  Body  mass index is 17.48 kg/m.  Estimated Nutritional Needs:   Kcal:  1880-2050 (33-36 kcal/kg)  Protein:  85-97 grams (1.5-1.7 grams/kg)  Fluid:  >/= 2 L/day  EDUCATION NEEDS:   No education needs identified at this time  Clayton Bibles, MS, RD, LDN Pager: [phone] After Hours Pager: [phone]

## 2017-08-13 NOTE — Progress Notes (Signed)
Referring Physician(s): Hoxworth,B  Supervising Physician: Daryll Brod  Patient Status:  [facility] - In-pt  Chief Complaint:  Left upper abdominal fluid collection  Subjective: Pt doing ok today; no new c/o; sitting up in chair   Allergies: Patient has no known allergies.  Medications: Prior to Admission medications   Medication Sig Start Date End Date Taking? Authorizing Provider  clindamycin (CLINDAGEL) 1 % gel Apply topically 2 (two) times daily. 04/12/17  Yes [name], MD  Clindamycin Phos-Benzoyl Perox gel Apply 1 application topically 2 (two) times daily. 04/14/17  Yes [name], MD  ferrous sulfate 325 (65 FE) MG tablet Take 1 tablet (325 mg total) by mouth 2 (two) times daily with a meal. 03/29/17  Yes Curcio, Roselie Awkward, NP  hydrocortisone 2.5 % cream Apply topically 2 (two) times daily. 05/25/17  Yes [name], MD  lidocaine-prilocaine (EMLA) cream Apply to affected area once 01/27/17  Yes [name], MD  ondansetron (ZOFRAN) 8 MG tablet Take 1 tablet (8 mg total) by mouth 2 (two) times daily as needed for refractory nausea / vomiting. Start on day 3 after chemotherapy. 05/25/17  Yes [name], MD  Oxycodone HCl 10 MG TABS Take 1 tablet (10 mg total) by mouth every 6 (six) hours as needed (pain). 07/26/17 08/25/17 Yes [name], MD  prochlorperazine (COMPAZINE) 10 MG tablet Take 1 tablet (10 mg total) by mouth every 6 (six) hours as needed (Nausea or vomiting). 05/25/17  Yes [name], MD  rivaroxaban (XARELTO) 20 MG TABS tablet Take 1 tablet (20 mg total) by mouth daily with supper. 05/25/17  Yes [name], MD  loperamide (IMODIUM A-D) 2 MG tablet Take 1 tablet (2 mg total) by mouth 4 (four) times daily as needed for diarrhea or loose stools. Patient not taking: Reported on 07/29/2017 03/02/17   [name], MD  mirtazapine (REMERON) 15 MG tablet Take 1 tablet (15 mg total) by mouth at bedtime. Patient not taking: Reported on 07/29/2017 06/22/17   Gardenia Phlegm, NP     Vital  Signs: BP 98/69 (BP Location: Right Arm)   Pulse (!) 113   Temp 99.2 F (37.3 C) (Oral)   Resp 18   Ht 5\' 9"  (1.753 m)   Wt 118 lb 6.2 oz (53.7 kg)   SpO2 100%   BMI 17.48 kg/m   Physical Exam LUQ drain intact, insertion site ok, output minimal amt bloody fluid  Imaging: Ct Abdomen Pelvis W Contrast  Result Date: 08/13/2017 CLINICAL DATA:  History of metastatic colon cancer. Status post abscess drainage. EXAM: CT ABDOMEN AND PELVIS WITH CONTRAST TECHNIQUE: Multidetector CT imaging of the abdomen and pelvis was performed using the standard protocol following bolus administration of intravenous contrast. CONTRAST:  19mL ISOVUE-300 IOPAMIDOL (ISOVUE-300) INJECTION 61% COMPARISON:  CT scan of July 29, 2017. FINDINGS: Lower chest: No acute abnormality. Hepatobiliary: No focal liver abnormality is seen. No gallstones, gallbladder wall thickening, or biliary dilatation. Pancreas: Unremarkable. No pancreatic ductal dilatation or surrounding inflammatory changes. Spleen: 5.0 x 3.4 cm multiloculated fluid collection is seen between the pancreatic tail and spleen which is not significantly changed compared to prior exam. Splenic parenchyma appears normal. Adrenals/Urinary Tract: Adrenal glands are unremarkable. Kidneys are normal, without renal calculi, focal lesion, or hydronephrosis. Bladder is unremarkable. Stomach/Bowel: There is interval placement of what appears to be a colostomy in the right upper quadrant of the abdomen. The stomach is unremarkable. Air-filled appendix is noted. Mild wall thickening of several loops of small bowel  is noted without significant dilatation. This is concerning for some form of enteritis. Vascular/Lymphatic: Aortic atherosclerosis. No enlarged abdominal or pelvic lymph nodes. Reproductive: Stable prostatic enlargement. Other: Pigtail drainage catheter is again noted inferior to the spleen. Fluid collection appears to have resolved. Musculoskeletal: No acute or significant  osseous findings. IMPRESSION: Aortic atherosclerosis. Interval placement of colostomy in the right upper quadrant of the abdomen. No significant bowel dilatation is seen at this time. However, diffuse wall thickening is seen involving several loops of small bowel concerning for inflammation or edema. Pigtail drainage catheter is again noted inferior to the spleen, and fluid collection appears to have resolved. 5.0 x 3.4 cm multi loculated fluid collection is again noted between the pancreatic tail and spleen which is not significantly changed compared to prior exam. Electronically Signed   By: Marijo Conception, M.D.   On: 08/13/2017 08:30   Ir Sinus/fist Tube Chk-non Gi  Result Date: 08/12/2017 INDICATION: 56 year old with complex medical history including stage IV colon cancer and malignant perforation and abscess. Previous drain injection demonstrated a sinus tract to the pancreatic duct. Recently, the catheter has not been flushing. Plan for additional evaluation of the drain with fluoroscopy and possible exchange. EXAM: SINUS TRACT INJECTION / FISTULOGRAM COMPARISON:  None. MEDICATIONS: None ANESTHESIA/SEDATION: None COMPLICATIONS: None immediate. TECHNIQUE: Drain was disconnected from the suction bulb. The drain was successfully flushed with a 3 ml syringe of normal saline. Subsequently, the drain was injected under fluoroscopy. Drain was flushed at the end of the procedure and attached to the suction bulb. PROCEDURE: Drain injection with fluoroscopy. FINDINGS: Pigtail drain remains in the left abdomen and unchanged in position. Again noted is filling of a small cavity near the drain. Contrast was also draining back onto the skin surface. Unable to identify a fistula tract to the pancreas on this examination. IMPRESSION: Drain was successfully flushed with saline. Drain injection again confirmed a small collection around the tube. Unable to identify a fistula tract on these images. Recommend repeat CT  imaging to look for residual fluid collections in this area and discuss drain removal with General Surgery. Electronically Signed   By: Markus Daft M.D.   On: 08/12/2017 15:54   Ir Sinus/fist Tube Chk-non Gi  Result Date: 08/09/2017 CLINICAL DATA:  History of stage IV colon cancer complicated by a malignant perforation and development of a abscess within the tail of the pancreas, post CT-guided percutaneous drainage catheter placement on 07/11/2017 by Dr. [name]. Note, the percutaneous drainage catheter was subsequent exchanged on 07/19/2017. Patient has undergone interval diverting loop colostomy and request has in made for percutaneous drainage catheter injection. Given Dr. Dois Davenport high level of clinical concern for a pancreatic leak, there has been instructions NOT to exchange or remove the percutaneous drainage catheter at this time. EXAM: SINUS TRACT INJECTION/FISTULOGRAM COMPARISON:  CT abdomen pelvis - 07/29/2017; 07/10/2017; CT-guided percutaneous drainage catheter placement - 07/11/2017; fluoroscopic guided drainage catheter exchange -07/19/2017. CONTRAST:  15 cc Isovue 300 FLUOROSCOPY TIME:  1 minute (9 mGy) TECHNIQUE: The patient was positioned supine on the fluoroscopy table. A preprocedural spot fluoroscopic image was obtained of the left upper abdominal quadrant and existing percutaneous drainage catheter Multiple spot fluoroscopic and radiographic images were obtained following the injection of a small amount of contrast via the existing percutaneous drainage catheter. Images were reviewed and the procedure was terminated. The catheter was flushed with a small amount of saline and reconnected to a JP bulb. FINDINGS: Contrast injection demonstrates opacification of the decompressed  abscess cavity. There is reflux of contrast along the catheter tract to the entrance site at the skin surface. There is apparent communication between the decompressed abscess cavity and a serpiginous tubular structure  within the left upper abdominal quadrant which while indeterminate may represent the pancreatic duct. IMPRESSION: Drainage catheter injection demonstrates the decompressed abscess cavity communicates with a serpiginous tubular structure within the left upper abdominal quadrant, indeterminate though potentially representative of the pancreatic duct. Correlation with amylase and lipase levels of the acquired fluid from the drainage catheter could be performed as clinically indicated. Electronically Signed   By: Sandi Mariscal M.D.   On: 08/09/2017 18:33    Labs:  CBC:  Recent Labs  08/11/17 0155 08/11/17 0835 08/12/17 0527 08/13/17 1133  WBC 8.6 6.9 7.3 6.4  HGB 8.2* 8.1* 8.6* 9.1*  HCT 24.6* 24.5* 26.5* 27.5*  PLT 123* 125* 151 171    COAGS:  Recent Labs  01/11/17 1009 01/29/17 1256 07/11/17 0340 07/29/17 1954 08/12/17 0527  INR 1.20 1.07 1.27  --  1.02  APTT 34 31  --  32  --     BMP:  Recent Labs  08/10/17 0531 08/11/17 0835 08/12/17 0527 08/13/17 0526  NA 133* 133* 134* 132*  K 4.7 5.2* 4.5 4.4  CL 100* 101 102 101  CO2 24 25 24 24   GLUCOSE 125* 117* 101* 114*  BUN 13 15 17 13   CALCIUM 9.1 9.3 9.6 9.4  CREATININE 0.64 0.60* 0.65 0.60*  GFRNONAA >60 >60 >60 >60  GFRAA >60 >60 >60 >60    LIVER FUNCTION TESTS:  Recent Labs  08/06/17 0539 08/09/17 0523 08/10/17 0531 08/12/17 0527  BILITOT 0.2* 0.4 0.3 0.4  AST 20 19 30 22   ALT 7* 7* 13* 14*  ALKPHOS 45 64 98 123  PROT 6.6 7.1 7.5 8.0  ALBUMIN 2.6* 2.6* 2.7* 2.8*    Assessment and Plan: Metastatic left colon cancer with history of pancreatic tail abscess, s/p perc drain on 07/10/17; temp 99.2; creat 0.6, latest drain injection study yesterday with no definite fistulous tract identified; CT A/P yesterday revealed: Aortic atherosclerosis.Interval placement of colostomy in the right upper quadrant of the abdomen. No significant bowel dilatation is seen at this time.However, diffuse wall thickening is seen  involving several loops of small bowel concerning for inflammation or edema.Pigtail drainage catheter is again noted inferior to the spleen, and fluid collection appears to have resolved. 5.0 x 3.4 cm multi loculated fluid collection is again noted between the pancreatic tail and spleen which is not significantly changed compared to prior exam.Imaging studies were reviewed by Dr. [name] and case d/w CCS. Decision made to remove LUQ drain- drain removed in its entirety without immediate complications. Gauze dressing applied to site. No role for drainage of stable panc tail/splenic multiloculated coll at this time, which may represent residual tumor per Dr. [name].    Electronically Signed: D. Rowe Robert, PA-C 08/13/2017, 2:22 PM   I spent a total of 20 minutes at the the patient's bedside AND on the patient's hospital floor or unit, greater than 50% of which was counseling/coordinating care for left abdominal drain    Patient ID: Jeff [name], male   [name]: 06/26/1961, 56 y.o.   MRN: [id number]

## 2017-08-13 NOTE — Progress Notes (Signed)
Patient requests I be with him as he calls his daughter after speaking to MD. No answer; patient left daughter a voicemail to call him back. Donne Hazel, RN

## 2017-08-14 LAB — BASIC METABOLIC PANEL
Anion gap: 7 (ref 5–15)
BUN: 14 mg/dL (ref 6–20)
CHLORIDE: 101 mmol/L (ref 101–111)
CO2: 25 mmol/L (ref 22–32)
CREATININE: 0.64 mg/dL (ref 0.61–1.24)
Calcium: 9.4 mg/dL (ref 8.9–10.3)
Glucose, Bld: 113 mg/dL — ABNORMAL HIGH (ref 65–99)
POTASSIUM: 4.2 mmol/L (ref 3.5–5.1)
SODIUM: 133 mmol/L — AB (ref 135–145)

## 2017-08-14 LAB — MAGNESIUM: MAGNESIUM: 1.5 mg/dL — AB (ref 1.7–2.4)

## 2017-08-14 LAB — PHOSPHORUS: PHOSPHORUS: 4.7 mg/dL — AB (ref 2.5–4.6)

## 2017-08-14 MED ORDER — TRACE MINERALS CR-CU-MN-SE-ZN 10-1000-500-60 MCG/ML IV SOLN
INTRAVENOUS | Status: AC
  Administered 2017-08-14: 19:00:00 via INTRAVENOUS
  Filled 2017-08-14: qty 960

## 2017-08-14 MED ORDER — MAGNESIUM SULFATE 2 GM/50ML IV SOLN
2.0000 g | Freq: Once | INTRAVENOUS | Status: AC
  Administered 2017-08-14: 2 g via INTRAVENOUS
  Filled 2017-08-14: qty 50

## 2017-08-14 MED ORDER — FAT EMULSION 20 % IV EMUL
240.0000 mL | INTRAVENOUS | Status: AC
  Administered 2017-08-14: 240 mL via INTRAVENOUS
  Filled 2017-08-14: qty 250

## 2017-08-14 NOTE — Progress Notes (Signed)
PHARMACY - ADULT TOTAL PARENTERAL NUTRITION CONSULT NOTE   Pharmacy Consult for TPN Indication: SBO  Patient Measurements: Height: 5\' 9"  (175.3 cm) Weight: 118 lb 6.2 oz (53.7 kg) IBW/kg (Calculated) : 70.7 TPN AdjBW (KG): 55.3 Body mass index is 17.48 kg/m.   Insulin Requirements:  DC'd  SSI/CBGs 8/11  Current Nutrition: regular diet; TPN; ensure bid ordered on 8/15 (not taking)  IVF: none  Central access: PICC TPN start date: 8/7  ASSESSMENT                                                                                                          HPI:  56 y.o.malewith medical hx significant for metastatic colon cancer, on chemo, pancreatic abscess with percutaneous drain, iron deficiency anemia, and hx of right leg DVT, on Xarelto.  Presented to the ED for evaluation of 3 days of abdominal pain with nausea, vomiting, and anorexia. CT of the abdomen and pelvis at the time of admission revealed a bowel obstruction at the level of the descending colon at the location of the percutaneous drainage catheter.  Significant events:  8/7: Sigmoidoscopy- Malignant completely obstructing tumor in the                                    descending colon. 8/8: Transverse Loop Colostomy 8/14: starting clear liquid diet; clamp NG tube 8/15: full liquid diet 8/16: reduce TPN to 1/2 per surgery, regular diet ordered 8/17-8/18: po intake minimal, per discussion with MD - planning calorie count  Today:   Glucose (goal <150): no hx DM;  SSI and CBGs stopped on 8/11, serum glucose ok  Electrolytes: Na low 133, Phos high 4.7, Mg low 1.5  Renal: SCr WNL, stable  LFTs: ALT low  TGs: 445 (8/8), 29 (8/9), 83 (8/13)  Prealbumin - 6.4 (8/4), 9.8 (8/8), 5.6 (8/13)  I/O (-) 614 ml ;  colostomy 850 ml   NUTRITIONAL GOALS                                                                                             RD recs (8/10): Kcal:  1880-2050 (33-36 kcal/kg) Protein:  85-97 grams (1.5-1.7  grams/kg)  Clinimix E 5/20 at a goal rate of 26ml/hr + 20% fat emulsion at 71ml/hr x 12 hours  to provide: 90 g/day protein, 2064 Kcal/day.  PLAN  Now: Magnesium sulfate 2gm IV x 1  At 1800 today:  continue Clinimix E 5/20 at 28ml/hr  Continue 20% fat emulsion at 73ml/hr x 12 hours daily  TPN to contain standard multivitamins and trace elements.   mIVF per MD, none currently  BMet with mag and phos in am  TPN lab panels on Mondays & Thursdays.  F/u oral intake and ability to stop TPN  Peggyann Juba, PharmD, BCPS Pager: [phone] 08/14/2017, 9:25 AM

## 2017-08-14 NOTE — Progress Notes (Signed)
[facility]/Trauma Progress Note  10 Days Post-Op   Assessment/Plan SBO due to metastatic adenocarcinoma of colon- S/P Transverse loop colostomy 08/04/17 Dr. [name] - Resolved ileus - ostomy output improved to 850 mL/24 hours.     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  Restart tomorrow.   Severe PCM- prealbumin 5.6 on 08/09/17; 1/2'd TPN. Calorie counts.  FEN: TPN, regular diet. VTE: SCD's, heparin on hold ID: no abx currently Follow up: TBD  Plan: wean TNA. Calorie counts.   SNF pending.   LOS: 16 days    Subjective:  CC: S/P diverting colostomy  Drain removed yesterday.   Objective: Vital signs in last 24 hours: Temp:  [98.2 F (36.8 C)-98.4 F (36.9 C)] 98.2 F (36.8 C) (08/18 0545) Pulse Rate:  [102-110] 110 (08/18 0545) Resp:  [16-18] 16 (08/18 0545) BP: (101-136)/(71-87) 101/71 (08/18 0545) SpO2:  [100 %] 100 % (08/18 0545) Last BM Date: 08/11/17  Intake/Output from previous day: 08/17 0701 - 08/18 0700 In: 1110.7 [P.O.:360; I.V.:750.7] Out: 1725 [Urine:875; Stool:850] Intake/Output this shift: Total I/O In: 240 [P.O.:240] Out: 300 [Urine:300]  PE: Gen: Alert, NAD, pleasant, cooperative Pulm: Rate andeffort normal Abd: Soft, not distended, +BS, incision appears well healing with no bleeding and no signs of infection, stoma purple, applesauce consistency. Skin: no rashes noted, warm and dry  Anti-infectives: Anti-infectives    Start     Dose/Rate Route Frequency Ordered Stop   08/04/17 2200   piperacillin-tazobactam (ZOSYN) IVPB 3.375 g     3.375 g 12.5 mL/hr over 240 Minutes Intravenous Every 8 hours 08/04/17 1706 08/05/17 2244   08/04/17 0951  cefoTEtan in Dextrose 5% (CEFOTAN) 2-2.08 GM-% IVPB    Comments:  Algis Liming   : cabinet override      08/04/17 0951 08/04/17 1102   08/04/17 0945  cefoTEtan in Dextrose 5% (CEFOTAN) IVPB 2 g     2 g Intravenous  Once 08/04/17 0820 08/04/17 1117   08/04/17 0800  cefoTEtan (CEFOTAN) 2 g in dextrose 5 % 50 mL IVPB  Status:  Discontinued     2 g 100 mL/hr over 30 Minutes Intravenous On call to O.R. 08/04/17 0746 08/04/17 0819   07/29/17 2359  piperacillin-tazobactam (ZOSYN) IVPB 3.375 g  Status:  Discontinued     3.375 g 12.5 mL/hr over 240 Minutes Intravenous Every 8 hours 07/29/17 1729 08/04/17 1706   07/29/17 1730  piperacillin-tazobactam (ZOSYN) IVPB 3.375 g     3.375 g 100 mL/hr over 30 Minutes Intravenous  Once 07/29/17 1728 07/29/17 1853      Lab Results:   Recent Labs  08/12/17 0527 08/13/17 1133  WBC 7.3 6.4  HGB 8.6* 9.1*  HCT 26.5* 27.5*  PLT 151 171   BMET  Recent Labs  08/13/17 0526 08/14/17 0736  NA 132* 133*  K 4.4 4.2  CL 101 101  CO2 24 25  GLUCOSE 114* 113*  BUN 13 14  CREATININE 0.60* 0.64  CALCIUM 9.4 9.4   PT/INR  Recent Labs  08/12/17 0527  LABPROT 13.4  INR 1.02   CMP  Component Value Date/Time   NA 133 (L) 08/14/2017 0736   NA 135 (L) 07/26/2017 1014   K 4.2 08/14/2017 0736   K 3.3 (L) 07/26/2017 1014   CL 101 08/14/2017 0736   CO2 25 08/14/2017 0736   CO2 25 07/26/2017 1014   GLUCOSE 113 (H) 08/14/2017 0736   GLUCOSE 129 07/26/2017 1014   BUN 14 08/14/2017 0736   BUN 9.8 07/26/2017 1014   CREATININE 0.64 08/14/2017 0736   CREATININE 1.0 07/26/2017 1014   CALCIUM 9.4 08/14/2017 0736   CALCIUM 9.3 07/26/2017 1014   PROT 8.0 08/12/2017 0527   PROT 8.2 07/26/2017 1014   ALBUMIN 2.8 (L) 08/12/2017 0527   ALBUMIN 3.3 (L) 07/26/2017 1014   AST 22 08/12/2017 0527    AST 24 07/26/2017 1014   ALT 14 (L) 08/12/2017 0527   ALT 6 07/26/2017 1014   ALKPHOS 123 08/12/2017 0527   ALKPHOS 87 07/26/2017 1014   BILITOT 0.4 08/12/2017 0527   BILITOT 0.23 07/26/2017 1014   GFRNONAA >60 08/14/2017 0736   GFRAA >60 08/14/2017 0736   Lipase     Component Value Date/Time   LIPASE 14 07/29/2017 1122    Studies/Results: Ct Abdomen Pelvis W Contrast  Result Date: 08/13/2017 CLINICAL DATA:  History of metastatic colon cancer. Status post abscess drainage. EXAM: CT ABDOMEN AND PELVIS WITH CONTRAST TECHNIQUE: Multidetector CT imaging of the abdomen and pelvis was performed using the standard protocol following bolus administration of intravenous contrast. CONTRAST:  73mL ISOVUE-300 IOPAMIDOL (ISOVUE-300) INJECTION 61% COMPARISON:  CT scan of July 29, 2017. FINDINGS: Lower chest: No acute abnormality. Hepatobiliary: No focal liver abnormality is seen. No gallstones, gallbladder wall thickening, or biliary dilatation. Pancreas: Unremarkable. No pancreatic ductal dilatation or surrounding inflammatory changes. Spleen: 5.0 x 3.4 cm multiloculated fluid collection is seen between the pancreatic tail and spleen which is not significantly changed compared to prior exam. Splenic parenchyma appears normal. Adrenals/Urinary Tract: Adrenal glands are unremarkable. Kidneys are normal, without renal calculi, focal lesion, or hydronephrosis. Bladder is unremarkable. Stomach/Bowel: There is interval placement of what appears to be a colostomy in the right upper quadrant of the abdomen. The stomach is unremarkable. Air-filled appendix is noted. Mild wall thickening of several loops of small bowel is noted without significant dilatation. This is concerning for some form of enteritis. Vascular/Lymphatic: Aortic atherosclerosis. No enlarged abdominal or pelvic lymph nodes. Reproductive: Stable prostatic enlargement. Other: Pigtail drainage catheter is again noted inferior to the spleen. Fluid  collection appears to have resolved. Musculoskeletal: No acute or significant osseous findings. IMPRESSION: Aortic atherosclerosis. Interval placement of colostomy in the right upper quadrant of the abdomen. No significant bowel dilatation is seen at this time. However, diffuse wall thickening is seen involving several loops of small bowel concerning for inflammation or edema. Pigtail drainage catheter is again noted inferior to the spleen, and fluid collection appears to have resolved. 5.0 x 3.4 cm multi loculated fluid collection is again noted between the pancreatic tail and spleen which is not significantly changed compared to prior exam. Electronically Signed   By: Marijo Conception, M.D.   On: 08/13/2017 08:30   Ir Sinus/fist Tube Chk-non Gi  Result Date: 08/12/2017 INDICATION: 56 year old with complex medical history including stage IV colon cancer and malignant perforation and abscess. Previous drain injection demonstrated a sinus tract to the pancreatic duct. Recently, the catheter has not been flushing. Plan for additional evaluation of the drain with fluoroscopy and possible exchange. EXAM: SINUS TRACT INJECTION / FISTULOGRAM  COMPARISON:  None. MEDICATIONS: None ANESTHESIA/SEDATION: None COMPLICATIONS: None immediate. TECHNIQUE: Drain was disconnected from the suction bulb. The drain was successfully flushed with a 3 ml syringe of normal saline. Subsequently, the drain was injected under fluoroscopy. Drain was flushed at the end of the procedure and attached to the suction bulb. PROCEDURE: Drain injection with fluoroscopy. FINDINGS: Pigtail drain remains in the left abdomen and unchanged in position. Again noted is filling of a small cavity near the drain. Contrast was also draining back onto the skin surface. Unable to identify a fistula tract to the pancreas on this examination. IMPRESSION: Drain was successfully flushed with saline. Drain injection again confirmed a small collection around the tube.  Unable to identify a fistula tract on these images. Recommend repeat CT imaging to look for residual fluid collections in this area and discuss drain removal with General Surgery. Electronically Signed   By: Markus Daft M.D.   On: 08/12/2017 15:54      [place] [facility] 08/14/2017, 11:06 AM

## 2017-08-14 NOTE — Progress Notes (Signed)
[facility] PROGRESS NOTE    Progress Note  Jeff [name]  WUJ:[id number] [name]: [birth year] [name]: 07/29/2017 PCP: [name], MD     Brief Narrative:   Jeff [name] is an 56 y.o. male past medical history is significant for metastatic colon cancer and chemotherapy, pancreatic abscesses with percutaneous drain history of right lower showed a DVT on Xarelto presents to the ED for evaluation of abdominal pain nausea and vomiting and anorexia the started 3 days prior to admission.  CT scan of the abdomen and pelvis on admission revealed small bowel obstruction at the level of the descending colon, there was colonic thickening due to neoplasm. Mild decrease in size of multiloculated fluid collection in the splenic ileum GI, general surgery and met on been consulted. GI had an unsuccessful attempt of endoscopically stenting the colon, he is status post surgical intervention on his transverse colon and 08/04/2017, following surgeries had a prolonged ileus with NG tube in place.  Assessment/Plan:   Principal Problem:   Bowel obstruction (HCC) Active Problems:   Primary colon cancer with metastasis to other site ([place])   Anemia, iron deficiency   History of deep vein thrombosis (DVT) of lower extremity   Hyponatremia   Intra-abdominal abscess    Hypomagnesemia   Pancreatic abscess   Malignant neoplasm of splenic flexure (HCC)   Malnutrition of moderate degree   Severe malnutrition (HCC)   Colon obstruction (HCC)  Colonic obstruction/ileus: Likely related to to his colon cancer. Patient work with physical therapy recommended skilled nursing facility.  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 Consult palliative care. Due to his poor prognoses.  Pancreatic abscess: The percutaneous drain placement 07/19/2017. CT scan of the abdomen and pelvis with IV contrast  showed no abscesses adjacent to the pigtail, but I did 5.0 x 3.4 cm multi loculated fluid collection. IR removed the drain, since he's had no fevers, and a fluid collection I detailed the pancreas is not change compared to previous CT scan there is no role to drain a stable pancreatic tail fluid collection. Which I had thinks is residual tumor.  DVT of the right lower extremity: Diagnosis on 02/17/2017. Surgery to recommend when to start Xarelto.  Pancytopenia due to chemotherapy: Remained stable continue to hold heparin. As he was oozing from the incision site.  Hypomagnesemia: Replete orally recheck in the morning.  Mild hyponatremia: Seems to be euvolemic on physical exam will continue to trend.   Severe protein clinic malnutrition: Malnutrition Type:  Nutrition Problem: Malnutrition (severe) Etiology: chronic illness, catabolic illness, cancer and cancer related treatments   Malnutrition Characteristics:  Signs/Symptoms: severe depletion of muscle mass, severe depletion of body fat, percent weight loss   Nutrition Interventions:  Interventions: Ensure Enlive (each supplement provides 350kcal and 20 grams of protein), TPN   DVT prophylaxis: heaprin Family Communication:none Disposition Plan/Barrier to D/C: unable to determine Code Status:     Code Status Orders        Start     Ordered   07/29/17 1737  Full code  Continuous     07/29/17 1738    Code Status History    Date Active Date Inactive Code Status Order ID Comments User Context   07/10/2017 12:41 AM 07/14/2017  4:35 PM Full Code [id number]  Ivor Costa, MD ED   01/11/2017  1:50 AM 01/19/2017  8:08 PM Full Code [id number]  Etta Quill, DO ED  IV Access:    Peripheral IV   Procedures and diagnostic studies:   Ct Abdomen Pelvis W Contrast  Result Date: 08/13/2017 CLINICAL DATA:  History of metastatic colon cancer. Status post abscess drainage. EXAM: CT ABDOMEN AND PELVIS WITH CONTRAST  TECHNIQUE: Multidetector CT imaging of the abdomen and pelvis was performed using the standard protocol following bolus administration of intravenous contrast. CONTRAST:  19m ISOVUE-300 IOPAMIDOL (ISOVUE-300) INJECTION 61% COMPARISON:  CT scan of July 29, 2017. FINDINGS: Lower chest: No acute abnormality. Hepatobiliary: No focal liver abnormality is seen. No gallstones, gallbladder wall thickening, or biliary dilatation. Pancreas: Unremarkable. No pancreatic ductal dilatation or surrounding inflammatory changes. Spleen: 5.0 x 3.4 cm multiloculated fluid collection is seen between the pancreatic tail and spleen which is not significantly changed compared to prior exam. Splenic parenchyma appears normal. Adrenals/Urinary Tract: Adrenal glands are unremarkable. Kidneys are normal, without renal calculi, focal lesion, or hydronephrosis. Bladder is unremarkable. Stomach/Bowel: There is interval placement of what appears to be a colostomy in the right upper quadrant of the abdomen. The stomach is unremarkable. Air-filled appendix is noted. Mild wall thickening of several loops of small bowel is noted without significant dilatation. This is concerning for some form of enteritis. Vascular/Lymphatic: Aortic atherosclerosis. No enlarged abdominal or pelvic lymph nodes. Reproductive: Stable prostatic enlargement. Other: Pigtail drainage catheter is again noted inferior to the spleen. Fluid collection appears to have resolved. Musculoskeletal: No acute or significant osseous findings. IMPRESSION: Aortic atherosclerosis. Interval placement of colostomy in the right upper quadrant of the abdomen. No significant bowel dilatation is seen at this time. However, diffuse wall thickening is seen involving several loops of small bowel concerning for inflammation or edema. Pigtail drainage catheter is again noted inferior to the spleen, and fluid collection appears to have resolved. 5.0 x 3.4 cm multi loculated fluid collection is  again noted between the pancreatic tail and spleen which is not significantly changed compared to prior exam. Electronically Signed   By: Jeff [name] M.D.   On: 08/13/2017 08:30   Ir Sinus/fist Tube Chk-non Gi  Result Date: 08/12/2017 INDICATION: 56year old with complex medical history including stage IV colon cancer and malignant perforation and abscess. Previous drain injection demonstrated a sinus tract to the pancreatic duct. Recently, the catheter has not been flushing. Plan for additional evaluation of the drain with fluoroscopy and possible exchange. EXAM: SINUS TRACT INJECTION / FISTULOGRAM COMPARISON:  None. MEDICATIONS: None ANESTHESIA/SEDATION: None COMPLICATIONS: None immediate. TECHNIQUE: Drain was disconnected from the suction bulb. The drain was successfully flushed with a 3 ml syringe of normal saline. Subsequently, the drain was injected under fluoroscopy. Drain was flushed at the end of the procedure and attached to the suction bulb. PROCEDURE: Drain injection with fluoroscopy. FINDINGS: Pigtail drain remains in the left abdomen and unchanged in position. Again noted is filling of a small cavity near the drain. Contrast was also draining back onto the skin surface. Unable to identify a fistula tract to the pancreas on this examination. IMPRESSION: Drain was successfully flushed with saline. Drain injection again confirmed a small collection around the tube. Unable to identify a fistula tract on these images. Recommend repeat CT imaging to look for residual fluid collections in this area and discuss drain removal with General Surgery. Electronically Signed   By: Jeff DaftM.D.   On: 08/12/2017 15:54     Medical Consultants:    None.  Anti-Infectives:   None  Subjective:    KJavier DockerCouser no new complains.  Objective:    Vitals:   08/13/17 0912 08/13/17 1446 08/13/17 2132 08/14/17 0545  BP: 98/69 136/87 127/85 101/71  Pulse: (!) 113 (!) 102 (!) 107 (!) 110  Resp:  _0 Temp: 99.2 F (37.3 C) 98.3 F (36.8 C) 98.4 F (36.9 C) 98.2 F (36.8 C)  TempSrc: Oral Oral Oral Oral  SpO2: 100% 100% 100% 100%  Weight:      Height:        Intake/Output Summary (Last 24 hours) at 08/14/17 1034 Last data filed at 08/14/17 1004  Gross per 24 hour  Intake          1230.67 ml  Output             1675 ml  Net          -444.33 ml   Filed Weights   08/03/17 0500 08/06/17 0500 08/12/17 0519  Weight: 59.2 kg (130 lb 8.2 oz) 64.4 kg (141 lb 15.6 oz) 53.7 kg (118 lb 6.2 oz)    Exam: General exam: Cachectic, no acute distress and stoic. Respiratory system: Good air movement and clear to auscultation. Cardiovascular system: Regular rate and rhythm with positive S1-S2. Gastrointestinal system: Abdomen is soft with gauze over the incision site. No further oozing. Central nervous system: Awake alert and oriented 3. Extremities: No lower extremity edema. Skin: No rashes. Psychiatry: Judgment and insight appeared normal   Data Reviewed:    Labs: Basic Metabolic Panel:  Recent Labs Lab 08/09/17 0523 08/10/17 0531 08/11/17 0835 08/12/17 0527 08/13/17 0526 08/14/17 0736  NA 132* 133* 133* 134* 132* 133*  K 4.5 4.7 5.2* 4.5 4.4 4.2  CL 101 100* 101 102 101 101  CO2 _1 GLUCOSE 133* 125* 117* 101* 114* 113*  BUN _2 CREATININE 0.66 0.64 0.60* 0.65 0.60* 0.64  CALCIUM 8.6* 9.1 9.3 9.6 9.4 9.4  MG 1.7 1.7 1.6* 1.6* 1.6* 1.5*  PHOS 4.7*  --   --  5.1* 4.2 4.7*   GFR Estimated Creatinine Clearance: 76.4 mL/min (by C-G formula based on SCr of 0.64 mg/dL). Liver Function Tests:  Recent Labs Lab 08/09/17 0523 08/10/17 0531 08/12/17 0527  AST _3 ALT 7* 13* 14*  ALKPHOS 64 98 123  BILITOT 0.4 0.3 0.4  PROT 7.1 7.5 8.0  ALBUMIN 2.6* 2.7* 2.8*   No results for input(s): LIPASE, AMYLASE in the last 168 hours. No results for input(s): AMMONIA in the last 168 hours. Coagulation profile  Recent  Labs Lab 08/12/17 0527  INR 1.02    CBC:  Recent Labs Lab 08/09/17 0523 08/10/17 0531 08/11/17 0155 08/11/17 0835 08/12/17 0527 08/13/17 1133  WBC 7.4 7.3 8.6 6.9 7.3 6.4  NEUTROABS 5.2  --   --   --  4.7  --   HGB 8.4* 8.3* 8.2* 8.1* 8.6* 9.1*  HCT 24.8* 25.5* 24.6* 24.5* 26.5* 27.5*  MCV 84.6 85.0 84.8 85.7 85.5 84.6  PLT 100* 116* 123* 125* 151 171   Cardiac Enzymes: No results for input(s): CKTOTAL, CKMB, CKMBINDEX, TROPONINI in the last 168 hours. BNP (last 3 results) No results for input(s): PROBNP in the last 8760 hours. CBG: No results for input(s): GLUCAP in the last 168 hours. D-Dimer: No results for input(s): DDIMER in the last 72 hours. Hgb A1c: No results for input(s): HGBA1C in the last 72 hours. Lipid Profile: No results for input(s): CHOL, HDL, LDLCALC, TRIG, CHOLHDL,  LDLDIRECT in the last 72 hours. Thyroid function studies: No results for input(s): TSH, T4TOTAL, T3FREE, THYROIDAB in the last 72 hours.  Invalid input(s): FREET3 Anemia work up: No results for input(s): VITAMINB12, FOLATE, FERRITIN, TIBC, IRON, RETICCTPCT in the last 72 hours. Sepsis Labs:  Recent Labs Lab 08/11/17 0155 08/11/17 0835 08/12/17 0527 08/13/17 1133  WBC 8.6 6.9 7.3 6.4   Microbiology No results found for this or any previous visit (from the past 240 hour(s)).   Medications:   . feeding supplement (ENSURE ENLIVE)  237 mL Oral BID BM  . pantoprazole (PROTONIX) IV  40 mg Intravenous Q12H  . sodium chloride flush  10-40 mL Intracatheter Q12H   Continuous Infusions: . magnesium sulfate 1 - 4 g bolus IVPB 2 g (08/14/17 1013)  . Marland KitchenTPN (CLINIMIX-E) Adult 40 mL/hr at 08/13/17 1714      LOS: 16 days   Jeff [name]  [facility] Pager [phone]  *Please refer to [email or web address], password [email or web address] to get updated schedule on who will round on this patient, as hospitalists switch teams weekly. If 7PM-7AM, please contact night-coverage at [email or web address], password  [email or web address] for any overnight needs.  08/14/2017, 10:34 AM

## 2017-08-15 ENCOUNTER — Inpatient Hospital Stay (HOSPITAL_COMMUNITY): Payer: Medicaid Other

## 2017-08-15 DIAGNOSIS — Z515 Encounter for palliative care: Secondary | ICD-10-CM

## 2017-08-15 DIAGNOSIS — Z7189 Other specified counseling: Secondary | ICD-10-CM

## 2017-08-15 MED ORDER — IOPAMIDOL (ISOVUE-300) INJECTION 61%
INTRAVENOUS | Status: AC
  Filled 2017-08-15: qty 100

## 2017-08-15 MED ORDER — HEPARIN SODIUM (PORCINE) 5000 UNIT/ML IJ SOLN
5000.0000 [IU] | Freq: Three times a day (TID) | INTRAMUSCULAR | Status: DC
  Administered 2017-08-15 – 2017-08-17 (×5): 5000 [IU] via SUBCUTANEOUS
  Filled 2017-08-15 (×6): qty 1

## 2017-08-15 MED ORDER — IOPAMIDOL (ISOVUE-300) INJECTION 61%
100.0000 mL | Freq: Once | INTRAVENOUS | Status: AC | PRN
  Administered 2017-08-15: 100 mL via INTRAVENOUS

## 2017-08-15 MED ORDER — DEXTROSE-NACL 5-0.45 % IV SOLN
INTRAVENOUS | Status: DC
  Administered 2017-08-15 – 2017-08-16 (×3): via INTRAVENOUS
  Administered 2017-08-17: 1000 mL via INTRAVENOUS

## 2017-08-15 MED ORDER — FAT EMULSION 20 % IV EMUL
240.0000 mL | INTRAVENOUS | Status: AC
  Administered 2017-08-15: 240 mL via INTRAVENOUS
  Filled 2017-08-15: qty 250

## 2017-08-15 MED ORDER — HYDROMORPHONE HCL-NACL 0.5-0.9 MG/ML-% IV SOSY
0.5000 mg | PREFILLED_SYRINGE | INTRAVENOUS | Status: DC | PRN
  Administered 2017-08-15 (×4): 1 mg via INTRAVENOUS
  Administered 2017-08-16: 2 mg via INTRAVENOUS
  Filled 2017-08-15 (×3): qty 2
  Filled 2017-08-15: qty 4
  Filled 2017-08-15: qty 2

## 2017-08-15 MED ORDER — MAGNESIUM SULFATE 2 GM/50ML IV SOLN
2.0000 g | Freq: Once | INTRAVENOUS | Status: DC
  Filled 2017-08-15: qty 50

## 2017-08-15 MED ORDER — M.V.I. ADULT IV INJ
INJECTION | INTRAVENOUS | Status: AC
  Administered 2017-08-15: 18:00:00 via INTRAVENOUS
  Filled 2017-08-15: qty 960

## 2017-08-15 NOTE — Progress Notes (Signed)
Calorie Count Note  Consult placed for 48 hours Calorie Count. Day 1 results will follow.  Please hang calorie count envelope on the patient's door. Document percent consumed for each item on the patient's meal tray ticket and keep in envelope. Also document percent of any supplement or snack pt consumes and keep documentation in envelope for RD to review.   Clayton Bibles, MS, RD, LDN Pager: [phone] After Hours Pager: [phone]

## 2017-08-15 NOTE — Progress Notes (Signed)
[facility]/Trauma Progress Note  11 Days Post-Op   Assessment/Plan SBO due to metastatic adenocarcinoma of colon- S/P Transverse loop colostomy 08/04/17 Dr. [name] - resolved.    Adenocarcinoma of the colon w/ mets to spleen/pancreas - on Chemotherapy 07/08/17   Abscess of the pancreatic tail- S/p perc drain; MULTIPLE ORGANISMS PRESENT, NONE PREDOMINANT, BACTEROIDES OVATUS , BETA LACTAMASE NEGATIVE on 07/11/17- IR did drain study 08/16, recommending repeat CT scan to evaluate site of abscess- Repeat CT today given worse clinical status and pain.    DVT RLE was on xarelto, holding heparin with oozing midline abdominal wound and blood in ostomy.  No blood in ostomy anymore.  Restart therapeutic lovenox today if doesn't need repeat perc drain.  Severe PCM- prealbumin 5.6 on 08/09/17; 1/2'd TPN. Calorie counts. Continue TNA since unable to eat this AM.    FEN: TPN, regular diet.  VTE: SCD's, heparin on hold ID: no abx currently Follow up: TBD  Plan:  Repeat CT given worse clinical picture.   LOS: 17 days    Subjective:  CC: felt horrible overnight with increasing abdominal pain and left back pain.  Was able to eat better yesterday, but this AM feels like he will vomit if he puts in anything.    Objective: Vital signs in last 24 hours: Temp:  [98.4 F (36.9 C)-99.4 F (37.4 C)] 98.5 F (36.9 C) (08/19 0454) Pulse Rate:  [97-103] 97 (08/19 0454) Resp:  [16] 16 (08/19 0454) BP: (103-111)/(64-81) 111/81 (08/19 0454) SpO2:  [100 %] 100 % (08/19 0454) Last BM Date: 08/14/17  Intake/Output from previous day: 08/18 0701 - 08/19 0700 In: 1655.3 [P.O.:480; I.V.:1125.3; IV Piggyback:50] Out: 1525 [Urine:1050; Stool:475] Intake/Output this shift: No intake/output data recorded.  PE: Gen: Alert, NAD, pleasant, cooperative Pulm: Rate andeffort normal Abd: Soft, distended, tender LUQ.  No stool in bag. Skin: no rashes noted, warm and  dry  Anti-infectives: Anti-infectives    Start     Dose/Rate Route Frequency Ordered Stop   08/04/17 2200  piperacillin-tazobactam (ZOSYN) IVPB 3.375 g     3.375 g 12.5 mL/hr over 240 Minutes Intravenous Every 8 hours 08/04/17 1706 08/05/17 2244   08/04/17 0951  cefoTEtan in Dextrose 5% (CEFOTAN) 2-2.08 GM-% IVPB    Comments:  Algis Liming   : cabinet override      08/04/17 0951 08/04/17 1102   08/04/17 0945  cefoTEtan in Dextrose 5% (CEFOTAN) IVPB 2 g     2 g Intravenous  Once 08/04/17 0820 08/04/17 1117   08/04/17 0800  cefoTEtan (CEFOTAN) 2 g in dextrose 5 % 50 mL IVPB  Status:  Discontinued     2 g 100 mL/hr over 30 Minutes Intravenous On call to O.R. 08/04/17 0746 08/04/17 0819   07/29/17 2359  piperacillin-tazobactam (ZOSYN) IVPB 3.375 g  Status:  Discontinued     3.375 g 12.5 mL/hr over 240 Minutes Intravenous Every 8 hours 07/29/17 1729 08/04/17 1706   07/29/17 1730  piperacillin-tazobactam (ZOSYN) IVPB 3.375 g     3.375 g 100 mL/hr over 30 Minutes Intravenous  Once 07/29/17 1728 07/29/17 1853      Lab Results:   Recent Labs  08/13/17 1133  WBC 6.4  HGB 9.1*  HCT 27.5*  PLT 171   BMET  Recent Labs  08/13/17 0526 08/14/17 0736  NA 132* 133*  K 4.4 4.2  CL 101 101  CO2 24 25  GLUCOSE 114* 113*  BUN 13 14  CREATININE 0.60* 0.64  CALCIUM  9.4 9.4   PT/INR No results for input(s): LABPROT, INR in the last 72 hours. CMP     Component Value Date/Time   NA 133 (L) 08/14/2017 0736   NA 135 (L) 07/26/2017 1014   K 4.2 08/14/2017 0736   K 3.3 (L) 07/26/2017 1014   CL 101 08/14/2017 0736   CO2 25 08/14/2017 0736   CO2 25 07/26/2017 1014   GLUCOSE 113 (H) 08/14/2017 0736   GLUCOSE 129 07/26/2017 1014   BUN 14 08/14/2017 0736   BUN 9.8 07/26/2017 1014   CREATININE 0.64 08/14/2017 0736   CREATININE 1.0 07/26/2017 1014   CALCIUM 9.4 08/14/2017 0736   CALCIUM 9.3 07/26/2017 1014   PROT 8.0 08/12/2017 0527   PROT 8.2 07/26/2017 1014   ALBUMIN 2.8 (L)  08/12/2017 0527   ALBUMIN 3.3 (L) 07/26/2017 1014   AST 22 08/12/2017 0527   AST 24 07/26/2017 1014   ALT 14 (L) 08/12/2017 0527   ALT 6 07/26/2017 1014   ALKPHOS 123 08/12/2017 0527   ALKPHOS 87 07/26/2017 1014   BILITOT 0.4 08/12/2017 0527   BILITOT 0.23 07/26/2017 1014   GFRNONAA >60 08/14/2017 0736   GFRAA >60 08/14/2017 0736   Lipase     Component Value Date/Time   LIPASE 14 07/29/2017 1122    Studies/Results: No results found.    Stark Klein , [facility] 08/15/2017, 9:29 AM

## 2017-08-15 NOTE — Progress Notes (Addendum)
[facility] PROGRESS NOTE    Progress Note  Jeff [name]  ERX:[id number] [name]: February 22, 1961 [name]: 07/29/2017 PCP: [name], MD     Brief Narrative:   Jeff [name] is an 56 y.o. male past medical history is significant for metastatic colon cancer and chemotherapy, pancreatic abscesses with percutaneous drain history of right lower showed a DVT on Xarelto presents to the ED for evaluation of abdominal pain nausea and vomiting and anorexia the started 3 days prior to admission.  CT scan of the abdomen and pelvis on admission revealed small bowel obstruction at the level of the descending colon, there was colonic thickening due to neoplasm. Mild decrease in size of multiloculated fluid collection in the splenic ileum GI, general surgery and met on been consulted. GI had an unsuccessful attempt of endoscopically stenting the colon, he is status post surgical intervention on his transverse colon and 08/04/2017, following surgeries had a prolonged ileus with NG tube in place.  Assessment/Plan:   Principal Problem:   Bowel obstruction (HCC) Active Problems:   Primary colon cancer with metastasis to other site ([place])   Anemia, iron deficiency   History of deep vein thrombosis (DVT) of lower extremity   Hyponatremia   Intra-abdominal abscess    Hypomagnesemia   Pancreatic abscess   Malignant neoplasm of splenic flexure (HCC)   Malnutrition of moderate degree   Severe malnutrition (HCC)   Colon obstruction (HCC)  Colonic obstruction/ileus: Likely related to to his colon cancer. Patient is complaining of abdominal pain and back pain, has diffuse tenderness on physical exam. Continue IV narcotics, will discuss with surgery recommended imaging study of the abdomen Restart IV fluids. Patient has not tolerated his diet today.  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 Consult palliative care. Due to his poor prognoses.  Pancreatic abscess: The percutaneous drain placement 07/19/2017. CT scan of the abdomen and pelvis with IV contrast showed no abscesses adjacent to the pigtail, but I did 5.0 x 3.4 cm multi loculated fluid collection at the tail of pancreas. Repeat CT scan of the abdomen and pelvis with contrast.  DVT of the right lower extremity: Diagnosis on 02/17/2017. Surgery to recommend when to start Xarelto.  Pancytopenia due to chemotherapy: Remained stable continue to hold heparin. As he was oozing from the incision site.  Hypomagnesemia: Today, patient is anorexic replete IV check magnesium level in the morning.  Mild hyponatremia: Seems to be euvolemic on physical exam will continue to trend.  Severe protein clinic malnutrition: Malnutrition Type:  Nutrition Problem: Malnutrition (severe) Etiology: chronic illness, catabolic illness, cancer and cancer related treatments   Malnutrition Characteristics:  Signs/Symptoms: severe depletion of muscle mass, severe depletion of body fat, percent weight loss   Nutrition Interventions:  Interventions: Ensure Enlive (each supplement provides 350kcal and 20 grams of protein), TPN  DVT prophylaxis: heprin Family Communication:none Disposition Plan/Barrier to D/C: unable to determine Code Status:     Code Status Orders        Start     Ordered   07/29/17 1737  Full code  Continuous     07/29/17 1738    Code Status History    Date Active Date Inactive Code Status Order ID Comments User Context   07/10/2017 12:41 AM 07/14/2017  4:35 PM Full Code [id number]  Ivor Costa, MD ED   01/11/2017  1:50 AM 01/19/2017  8:08 PM Full Code [id number]  Etta Quill,  DO ED        IV Access:    Peripheral IV   Procedures and diagnostic studies:   No results found.   Medical Consultants:    None.  Anti-Infectives:   None  Subjective:    Jeff Docker  [name] no new complains.  Objective:    Vitals:   08/14/17 0545 08/14/17 1400 08/14/17 2131 08/15/17 0454  BP: 101/71 103/71 110/64 111/81  Pulse: (!) 110 (!) 101 (!) 103 97  Resp: 16 16 16 16   Temp: 98.2 F (36.8 C) 98.4 F (36.9 C) 99.4 F (37.4 C) 98.5 F (36.9 C)  TempSrc: Oral Oral Oral Oral  SpO2: 100% 100% 100% 100%  Weight:      Height:        Intake/Output Summary (Last 24 hours) at 08/15/17 0921 Last data filed at 08/15/17 0600  Gross per 24 hour  Intake          1655.33 ml  Output             1525 ml  Net           130.33 ml   Filed Weights   08/03/17 0500 08/06/17 0500 08/12/17 0519  Weight: 59.2 kg (130 lb 8.2 oz) 64.4 kg (141 lb 15.6 oz) 53.7 kg (118 lb 6.2 oz)    Exam: General exam: Cachectic, seems to be uncomfortable. Respiratory system: Good air movement clear to auscultation. Cardiovascular system: Going rhythm with positive S1-S2. Gastrointestinal system:Left meniscus is distended, soft with no pain to palpation.  Central nervous system: Alert and oriented 3. Extremities: No lower extremity edema. Skin: No rashes. Psychiatry: Judgment and insight appeared normal   Data Reviewed:    Labs: Basic Metabolic Panel:  Recent Labs Lab 08/09/17 0523 08/10/17 0531 08/11/17 0835 08/12/17 0527 08/13/17 0526 08/14/17 0736  NA 132* 133* 133* 134* 132* 133*  K 4.5 4.7 5.2* 4.5 4.4 4.2  CL 101 100* 101 102 101 101  CO2 25 24 25 24 24 25   GLUCOSE 133* 125* 117* 101* 114* 113*  BUN 13 13 15 17 13 14   CREATININE 0.66 0.64 0.60* 0.65 0.60* 0.64  CALCIUM 8.6* 9.1 9.3 9.6 9.4 9.4  MG 1.7 1.7 1.6* 1.6* 1.6* 1.5*  PHOS 4.7*  --   --  5.1* 4.2 4.7*   GFR Estimated Creatinine Clearance: 76.4 mL/min (by C-G formula based on SCr of 0.64 mg/dL). Liver Function Tests:  Recent Labs Lab 08/09/17 0523 08/10/17 0531 08/12/17 0527  AST 19 30 22   ALT 7* 13* 14*  ALKPHOS 64 98 123  BILITOT 0.4 0.3 0.4  PROT 7.1 7.5 8.0  ALBUMIN 2.6* 2.7* 2.8*   No  results for input(s): LIPASE, AMYLASE in the last 168 hours. No results for input(s): AMMONIA in the last 168 hours. Coagulation profile  Recent Labs Lab 08/12/17 0527  INR 1.02    CBC:  Recent Labs Lab 08/09/17 0523 08/10/17 0531 08/11/17 0155 08/11/17 0835 08/12/17 0527 08/13/17 1133  WBC 7.4 7.3 8.6 6.9 7.3 6.4  NEUTROABS 5.2  --   --   --  4.7  --   HGB 8.4* 8.3* 8.2* 8.1* 8.6* 9.1*  HCT 24.8* 25.5* 24.6* 24.5* 26.5* 27.5*  MCV 84.6 85.0 84.8 85.7 85.5 84.6  PLT 100* 116* 123* 125* 151 171   Cardiac Enzymes: No results for input(s): CKTOTAL, CKMB, CKMBINDEX, TROPONINI in the last 168 hours. BNP (last 3 results) No results for input(s): PROBNP in the last 8760 hours.  CBG: No results for input(s): GLUCAP in the last 168 hours. D-Dimer: No results for input(s): DDIMER in the last 72 hours. Hgb A1c: No results for input(s): HGBA1C in the last 72 hours. Lipid Profile: No results for input(s): CHOL, HDL, LDLCALC, TRIG, CHOLHDL, LDLDIRECT in the last 72 hours. Thyroid function studies: No results for input(s): TSH, T4TOTAL, T3FREE, THYROIDAB in the last 72 hours.  Invalid input(s): FREET3 Anemia work up: No results for input(s): VITAMINB12, FOLATE, FERRITIN, TIBC, IRON, RETICCTPCT in the last 72 hours. Sepsis Labs:  Recent Labs Lab 08/11/17 0155 08/11/17 0835 08/12/17 0527 08/13/17 1133  WBC 8.6 6.9 7.3 6.4   Microbiology No results found for this or any previous visit (from the past 240 hour(s)).   Medications:   . feeding supplement (ENSURE ENLIVE)  237 mL Oral BID BM  . pantoprazole (PROTONIX) IV  40 mg Intravenous Q12H  . sodium chloride flush  10-40 mL Intracatheter Q12H   Continuous Infusions: . Marland KitchenTPN (CLINIMIX-E) Adult     And  . fat emulsion    . Marland KitchenTPN (CLINIMIX-E) Adult 40 mL/hr at 08/14/17 1915      LOS: 61 days   Charlynne Cousins  [facility] Pager [phone]  *Please refer to [email or web address], password [email or web address] to get updated  schedule on who will round on this patient, as hospitalists switch teams weekly. If 7PM-7AM, please contact night-coverage at [email or web address], password [email or web address] for any overnight needs.  08/15/2017, 9:21 AM

## 2017-08-15 NOTE — Progress Notes (Signed)
PHARMACY - ADULT TOTAL PARENTERAL NUTRITION CONSULT NOTE   Pharmacy Consult for TPN Indication: SBO  Patient Measurements: Height: 5\' 9"  (175.3 cm) Weight: 118 lb 6.2 oz (53.7 kg) IBW/kg (Calculated) : 70.7 TPN AdjBW (KG): 55.3 Body mass index is 17.48 kg/m.   Insulin Requirements:  DC'd  SSI/CBGs 8/11  Current Nutrition: regular diet; TPN; ensure bid ordered on 8/15 (not taking)  IVF: none  Central access: PICC TPN start date: 8/7  ASSESSMENT                                                                                                          HPI:  56 y.o.malewith medical hx significant for metastatic colon cancer, on chemo, pancreatic abscess with percutaneous drain, iron deficiency anemia, and hx of right leg DVT, on Xarelto.  Presented to the ED for evaluation of 3 days of abdominal pain with nausea, vomiting, and anorexia. CT of the abdomen and pelvis at the time of admission revealed a bowel obstruction at the level of the descending colon at the location of the percutaneous drainage catheter.  Significant events:  8/7: Sigmoidoscopy- Malignant completely obstructing tumor in the                                    descending colon. 8/8: Transverse Loop Colostomy 8/14: starting clear liquid diet; clamp NG tube 8/15: full liquid diet 8/16: reduce TPN to 1/2 per surgery, regular diet ordered 8/17-8/19: po intake minimal, per discussion with MD - planning calorie count  Today: no labs  Glucose (goal <150): no hx DM;  SSI and CBGs stopped on 8/11, serum glucose ok  Electrolytes: Na low 133, Phos high 4.7, Mg low 1.5 (8/18)  Renal: SCr WNL, stable  LFTs: ALT low  TGs: 445 (8/8), 29 (8/9), 83 (8/13), check in AM  Prealbumin - 6.4 (8/4), 9.8 (8/8), 5.6 (8/13), check in AM  I/O (-) 614 ml ;  colostomy 850 ml   NUTRITIONAL GOALS                                                                                             RD recs (8/10): Kcal:  1880-2050 (33-36  kcal/kg) Protein:  85-97 grams (1.5-1.7 grams/kg)  Clinimix E 5/20 at a goal rate of 41ml/hr + 20% fat emulsion at 58ml/hr x 12 hours  to provide: 90 g/day protein, 2064 Kcal/day.  PLAN  At 1800 today:  continue Clinimix E 5/20 at 62ml/hr  Continue 20% fat emulsion at 25ml/hr x 12 hours daily  TPN to contain standard multivitamins and trace elements.   mIVF per MD, none currently  TPN lab panels on Mondays & Thursdays.  F/u oral intake and ability to stop TPN  Peggyann Juba, PharmD, BCPS Pager: [phone] 08/15/2017, 8:26 AM

## 2017-08-15 NOTE — Progress Notes (Signed)
Pt refused heparin.  Pt stated he was scared of restarting a blood thinner and would like to speak with the MD about it first.

## 2017-08-16 ENCOUNTER — Other Ambulatory Visit: Payer: Medicaid Other

## 2017-08-16 ENCOUNTER — Ambulatory Visit: Payer: Medicaid Other

## 2017-08-16 LAB — CBC
HEMATOCRIT: 25.8 % — AB (ref 39.0–52.0)
Hemoglobin: 8.6 g/dL — ABNORMAL LOW (ref 13.0–17.0)
MCH: 27.8 pg (ref 26.0–34.0)
MCHC: 33.3 g/dL (ref 30.0–36.0)
MCV: 83.5 fL (ref 78.0–100.0)
Platelets: 163 10*3/uL (ref 150–400)
RBC: 3.09 MIL/uL — ABNORMAL LOW (ref 4.22–5.81)
RDW: 14.3 % (ref 11.5–15.5)
WBC: 7.4 10*3/uL (ref 4.0–10.5)

## 2017-08-16 LAB — DIFFERENTIAL
BASOS ABS: 0 10*3/uL (ref 0.0–0.1)
BASOS PCT: 0 %
EOS ABS: 0.2 10*3/uL (ref 0.0–0.7)
EOS PCT: 2 %
Lymphocytes Relative: 20 %
Lymphs Abs: 1.4 10*3/uL (ref 0.7–4.0)
MONO ABS: 0.3 10*3/uL (ref 0.1–1.0)
MONOS PCT: 4 %
Neutro Abs: 5.4 10*3/uL (ref 1.7–7.7)
Neutrophils Relative %: 74 %

## 2017-08-16 LAB — COMPREHENSIVE METABOLIC PANEL
ALBUMIN: 2.6 g/dL — AB (ref 3.5–5.0)
ALK PHOS: 108 U/L (ref 38–126)
ALT: 12 U/L — AB (ref 17–63)
AST: 24 U/L (ref 15–41)
Anion gap: 7 (ref 5–15)
BUN: 10 mg/dL (ref 6–20)
CALCIUM: 9.4 mg/dL (ref 8.9–10.3)
CO2: 25 mmol/L (ref 22–32)
CREATININE: 0.58 mg/dL — AB (ref 0.61–1.24)
Chloride: 101 mmol/L (ref 101–111)
Glucose, Bld: 113 mg/dL — ABNORMAL HIGH (ref 65–99)
Potassium: 3.8 mmol/L (ref 3.5–5.1)
SODIUM: 133 mmol/L — AB (ref 135–145)
Total Bilirubin: 0.3 mg/dL (ref 0.3–1.2)
Total Protein: 7.3 g/dL (ref 6.5–8.1)

## 2017-08-16 LAB — TRIGLYCERIDES: Triglycerides: 145 mg/dL (ref <150)

## 2017-08-16 LAB — PHOSPHORUS: PHOSPHORUS: 4.7 mg/dL — AB (ref 2.5–4.6)

## 2017-08-16 LAB — PREALBUMIN: Prealbumin: 18.2 mg/dL (ref 18–38)

## 2017-08-16 LAB — MAGNESIUM: Magnesium: 1.3 mg/dL — ABNORMAL LOW (ref 1.7–2.4)

## 2017-08-16 MED ORDER — MAGNESIUM SULFATE 2 GM/50ML IV SOLN
2.0000 g | Freq: Once | INTRAVENOUS | Status: AC
  Administered 2017-08-16: 2 g via INTRAVENOUS
  Filled 2017-08-16: qty 50

## 2017-08-16 MED ORDER — TRACE MINERALS CR-CU-MN-SE-ZN 10-1000-500-60 MCG/ML IV SOLN
INTRAVENOUS | Status: DC
  Administered 2017-08-16: 18:00:00 via INTRAVENOUS
  Filled 2017-08-16: qty 960

## 2017-08-16 MED ORDER — FAT EMULSION 20 % IV EMUL
240.0000 mL | INTRAVENOUS | Status: AC
  Administered 2017-08-16: 240 mL via INTRAVENOUS
  Filled 2017-08-16: qty 250

## 2017-08-16 MED ORDER — OXYCODONE HCL 5 MG PO TABS: 10.0000 mg | ORAL_TABLET | ORAL | Status: DC | PRN

## 2017-08-16 MED ORDER — OXYCODONE HCL 5 MG PO TABS
15.0000 mg | ORAL_TABLET | ORAL | Status: DC | PRN
  Administered 2017-08-16 – 2017-08-17 (×4): 15 mg via ORAL
  Filled 2017-08-16 (×4): qty 3

## 2017-08-16 MED ORDER — ENSURE ENLIVE PO LIQD
237.0000 mL | Freq: Three times a day (TID) | ORAL | Status: DC
  Administered 2017-08-16 – 2017-08-17 (×3): 237 mL via ORAL

## 2017-08-16 NOTE — Clinical Social Work Note (Signed)
Clinical Social Work Assessment  Patient Details  Name: Jeff [name] MRN: [id number] Date of Birth: March 13, 1961  Date of referral:  08/16/17               Reason for consult:  Discharge Planning                Permission sought to share information with:  Facility Art therapist granted to share information::  Yes, Verbal Permission Granted  Name::        Agency::     Relationship::     Contact Information:     Housing/Transportation Living arrangements for the past 2 months:  Hotel/Motel Source of Information:  Patient, Adult Children Patient Interpreter Needed:  None Criminal Activity/Legal Involvement Pertinent to Current Situation/Hospitalization:  No - Comment as needed Significant Relationships:  Adult Children Lives with:  Self Do you feel safe going back to the place where you live?   (Rehab recommended.) Need for family participation in patient care:  Yes (Comment)  Care giving concerns:  Pt is weak / deconditioned relating to illness. SNF placement recommended by PT.   Social Worker assessment / plan: Pt hospitalized from motel [employer] ) on 07/29/17 with a Bowel obstruction. Pt has Colon cancer with metastases and is followed by Dr. Burr [name] in oncology. Palliative Care Team has met with pt and pt has requested aggressive treatment. PT has recommended SNF at dc. CSW met with pt at bedside to assist with dc planning. Pt reports that he is in agreement with SNF placement but asked that his daughter be contacted and plan reviewed with her. CSW has contacted Ms. Jeff [name] [phone] ) and plan dc plan reviewed. Daughter is in agreement with plan for SNF. Pt is currently receiving TPN. It is unclear if this will be needed at dc. SNF search has been initiated. Expect limited responses due to pt having only [employer] and possibly needing TPN at facility. CSW will continue to follow to assist with dc planning needs.  Employment status:  Disabled  (Comment on whether or not currently receiving Disability) Insurance information:  Medicaid In [place] PT Recommendations:  [place] / Referral to community resources:  [place]  Patient/Family's Response to care:  Pt / daughter are in agreement with SNF placement.  Patient/Family's Understanding of and Emotional Response to Diagnosis, Current Treatment, and Prognosis:  Pt reports that he wants to continue with aggressive treatment. " I want to get stronger." MD PN indicate that pt has a poor prognosis. Ongoing support will be provided.  Emotional Assessment Appearance:  Appears stated age Attitude/Demeanor/Rapport:  Other (cooperative) Affect (typically observed):  Appropriate, Calm Orientation:  Oriented to Self, Oriented to Place, Oriented to  Time, Oriented to Situation Alcohol / Substance use:  Not Applicable Psych involvement (Current and /or in the community):  No (Comment)  Discharge Needs  Concerns to be addressed:  Discharge Planning Concerns Readmission within the last 30 days:  Yes Current discharge risk:  Chronically ill Barriers to Discharge:  No Barriers Identified   Jeff [name], [place] 08/16/2017, 12:01 PM

## 2017-08-16 NOTE — Clinical Social Work Placement (Signed)
   CLINICAL SOCIAL WORK PLACEMENT  NOTE  Date:  08/16/2017  Patient Details  Name: Jeff [name] MRN: [id number] Date of Birth: 09-Apr-1961  Clinical Social Work is seeking post-discharge placement for this patient at the [place] level of care (*CSW will initial, date and re-position this form in  chart as items are completed):  Yes   Patient/family provided with [place] Work Department's list of facilities offering this level of care within the geographic area requested by the patient (or if unable, by the patient's family).  Yes   Patient/family informed of their freedom to choose among providers that offer the needed level of care, that participate in Medicare, Medicaid or managed care program needed by the patient, have an available bed and are willing to accept the patient.  Yes   Patient/family informed of [facility]'s ownership interest in [facility] and [facility], as well as of the fact that they are under no obligation to receive care at these facilities.  PASRR submitted to EDS on 08/16/17     PASRR number received on 08/16/17     Existing PASRR number confirmed on       FL2 transmitted to all facilities in geographic area requested by pt/family on 08/16/17     FL2 transmitted to all facilities within larger geographic area on       Patient informed that his/her managed care company has contracts with or will negotiate with certain facilities, including the following:            Patient/family informed of bed offers received.  Patient chooses bed at       Physician recommends and patient chooses bed at      Patient to be transferred to   on  .  Patient to be transferred to facility by       Patient family notified on   of transfer.  Name of family member notified:        PHYSICIAN       Additional Comment:    _______________________________________________ [name], [place] 08/16/2017,  12:23 PM

## 2017-08-16 NOTE — Progress Notes (Signed)
Palliative care progress note  I met with Jeff [name].  He remains invested in plan for continued aggressive care.  Full note to follow. Micheline Rough, MD [place] Team [phone]

## 2017-08-16 NOTE — Progress Notes (Signed)
Physical Therapy Treatment Patient Details Name: Jeff [name] MRN: [id number] [name]: Jul 06, 1961 Today's Date: 08/16/2017    History of Present Illness SBO due to metastatic adenocarcinoma of colon- S/P Transverse loop colostomy 08/04/17 Dr. [name], post op ileus    PT Comments    Assisted with emptying colostomy for ambulation.  NT came in for afternoon vitals. Assisted OOB to amb full unit.  Used a RW for increased support due to extended length of stay and recent ABD surgery.  Positioned in recliner to comfort.  Pt plans to D/C to SNF in the [place] area.   Follow Up Recommendations  SNF     Equipment Recommendations  None recommended by PT    Recommendations for Other Services       Precautions / Restrictions Precautions Precautions: Fall Precaution Comments: colostomy Restrictions Weight Bearing Restrictions: No    Mobility  Bed Mobility Overal bed mobility: Needs Assistance Bed Mobility: Supine to Sit     Supine to sit: HOB elevated;Min guard     General bed mobility comments: incr time and min/guard for trunk  Transfers Overall transfer level: Needs assistance   Transfers: Sit to/from Stand Sit to Stand: Min guard Stand pivot transfers: Min guard       General transfer comment: increased ability to self perform  Ambulation/Gait Ambulation/Gait assistance: Min guard;Supervision Ambulation Distance (Feet): 450 Feet Assistive device: Rolling walker (2 wheeled) Gait Pattern/deviations: Step-to pattern;Step-through pattern;Trunk flexed Gait velocity: decreased   General Gait Details: good alternating gait with use of walker for safety   Stairs            Wheelchair Mobility    Modified Rankin (Stroke Patients Only)       Balance                                            Cognition Arousal/Alertness: Awake/alert Behavior During Therapy: WFL for tasks assessed/performed;Flat affect Overall Cognitive Status:  Within Functional Limits for tasks assessed                                 General Comments: pleasant      Exercises      General Comments        Pertinent Vitals/Pain Pain Assessment: 0-10 Pain Score: 6  Pain Location: ABD Pain Descriptors / Indicators: Grimacing;Operative site guarding Pain Intervention(s): Monitored during session;Repositioned    Home Living                      Prior Function            PT Goals (current goals can now be found in the care plan section) Progress towards PT goals: Progressing toward goals    Frequency    Min 3X/week      PT Plan Current plan remains appropriate    Co-evaluation              AM-PAC PT "6 Clicks" Daily Activity  Outcome Measure  Difficulty turning over in bed (including adjusting bedclothes, sheets and blankets)?: A Lot Difficulty moving from lying on back to sitting on the side of the bed? : A Lot Difficulty sitting down on and standing up from a chair with arms (e.g., wheelchair, bedside commode, etc,.)?: A Lot Help needed moving to and from  a bed to chair (including a wheelchair)?: A Lot Help needed walking in hospital room?: A Lot Help needed climbing 3-5 steps with a railing? : A Lot 6 Click Score: 12    End of Session Equipment Utilized During Treatment: Gait belt Activity Tolerance: Patient tolerated treatment well Patient left: in chair;with call bell/phone within reach Nurse Communication: Mobility status PT Visit Diagnosis: Other abnormalities of gait and mobility (R26.89);Muscle weakness (generalized) (M62.81)     Time: 1155-2080 PT Time Calculation (min) (ACUTE ONLY): 24 min  Charges:  $Gait Training: 8-22 mins $Therapeutic Activity: 8-22 mins                    G Codes:       Rica Koyanagi  PTA [facility]  Acute  Rehab Pager      [phone]

## 2017-08-16 NOTE — Progress Notes (Signed)
Nutrition Follow-up  DOCUMENTATION CODES:   Severe malnutrition in context of chronic illness, Underweight  INTERVENTION:   -TPN per Pharmacy -Continue Ensure Enlive po TID, each supplement provides 350 kcal and 20 grams of protein  -Continue Calorie Count -may need to continue for 72 hours  NUTRITION DIAGNOSIS:   Malnutrition (severe) related to chronic illness, catabolic illness, cancer and cancer related treatments as evidenced by severe depletion of muscle mass, severe depletion of body fat, percent weight loss.  Ongoing.  GOAL:   Patient will meet greater than or equal to 90% of their needs  Not meeting.  MONITOR:   PO intake, Supplement acceptance, Labs, Weight trends, Skin, I & O's (TPN)  ASSESSMENT:   56 y.o. 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 Patient reports that he was in his usual state until approximately 3 days ago when he noted the fairly sudden onset of pain in the mid abdomen with nausea. He went on to develop nonbloody nonbilious vomiting, had a couple loose stools, and has had loss of appetite.  Patient in room with a bottle of Ensure and Sprite at bedside. Pt states he did not eat anything at all yesterday d/t increased pain levels. States he had a little pain this morning and did not order any breakfast. RD encouraged pt to at least drink his Ensure and Sprite for protein and kcal. Pt agreed to do so. Pt states he can "chug" the Ensure and this RD encouraged him to slowly sip to ensure tolerance.  Since Day 1 of Calorie Count resulted in 0% meal completion, will order Calorie Count for 72 hours so we can see 2 days of intake. Recommend continuing TPN at half rate (Clinimix E 5/20 at  19ml/hr and 20% ILE @ 20 ml/hr x 12 hours -provides 1324 kcal and 48g protein).  Medications: IV Protonix BID, D5 -.45% NaCl infusion at 75 ml/hr -provides 306 kcal Labs reviewed: Low Na Elevated Phos Low Mg TG: 145 mg/dL  Diet Order:  Diet regular Room service appropriate? Yes; Fluid consistency: Thin TPN (CLINIMIX-E) Adult  Skin:  Wound (see comment) (Abdominal incision from 8/8)  Last BM:  8/18  Height:   Ht Readings from Last 1 Encounters:  07/31/17 5\' 9"  (1.753 m)    Weight:   Wt Readings from Last 1 Encounters:  08/12/17 118 lb 6.2 oz (53.7 kg)    Ideal Body Weight:  72.73 kg  BMI:  Body mass index is 17.48 kg/m.  Estimated Nutritional Needs:   Kcal:  1880-2050 (33-36 kcal/kg)  Protein:  85-97 grams (1.5-1.7 grams/kg)  Fluid:  >/= 2 L/day  EDUCATION NEEDS:   No education needs identified at this time  Clayton Bibles, MS, RD, LDN Pager: [phone] After Hours Pager: [phone]

## 2017-08-16 NOTE — Progress Notes (Addendum)
[facility] PROGRESS NOTE    Progress Note  Jeff [name]  RCV:[id number] [name]: [birth year] [name]: 07/29/2017 PCP: [name], MD     Brief Narrative:   Jeff [name] is an 56 y.o. male past medical history is significant for metastatic colon cancer and chemotherapy, pancreatic abscesses with percutaneous drain history of right lower showed a DVT on Xarelto presents to the ED for evaluation of abdominal pain nausea and vomiting and anorexia the started 3 days prior to admission.  CT scan of the abdomen and pelvis on admission revealed small bowel obstruction at the level of the descending colon, there was colonic thickening due to neoplasm. Mild decrease in size of multiloculated fluid collection in the splenic ileum GI, general surgery and met on been consulted. GI had an unsuccessful attempt of endoscopically stenting the colon, he is status post surgical intervention on his transverse colon and 08/04/2017, following surgeries had a prolonged ileus with NG tube in place.  Assessment/Plan:   Principal Problem:   Bowel obstruction (HCC) Active Problems:   Primary colon cancer with metastasis to other site ([place])   Anemia, iron deficiency   History of deep vein thrombosis (DVT) of lower extremity   Hyponatremia   Intra-abdominal abscess    Hypomagnesemia   Pancreatic abscess   Malignant neoplasm of splenic flexure (HCC)   Malnutrition of moderate degree   Severe malnutrition (HCC)   Colon obstruction (HCC)  Colonic obstruction/ileus: Likely related to to his colon cancer. Continue IV narcotics, CT scan showed small bowel obstruction. Abdomen is less distended, pain is controlled he would like to try a diet.  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 Appreciate palliative care assistance. Due to his poor prognoses.  Pancreatic abscess: The percutaneous  drain placement 07/19/2017. Repeated CT scan of the abdomen and pelvis showed possible small bowel obstruction, but he did have a lot of output through the  stoma.  DVT of the right lower extremity: Diagnosis on 02/17/2017. Surgery to recommend when to start Xarelto.  Pancytopenia due to chemotherapy: Remained stable continue to hold heparin.   Hypomagnesemia: Resolved.  Mild hyponatremia: Seems to be euvolemic on physical exam will continue to trend.  Severe protein clinic malnutrition: Malnutrition Type:  Nutrition Problem: Malnutrition (severe) Etiology: chronic illness, catabolic illness, cancer and cancer related treatments   Malnutrition Characteristics:  Signs/Symptoms: severe depletion of muscle mass, severe depletion of body fat, percent weight loss   Nutrition Interventions:  Interventions: Ensure Enlive (each supplement provides 350kcal and 20 grams of protein), TPN   DVT prophylaxis: heprin Family Communication:none Disposition Plan/Barrier to D/C: unable to determine Code Status:     Code Status Orders        Start     Ordered   07/29/17 1737  Full code  Continuous     07/29/17 1738    Code Status History    Date Active Date Inactive Code Status Order ID Comments User Context   07/10/2017 12:41 AM 07/14/2017  4:35 PM Full Code [id number]  Ivor Costa, MD ED   01/11/2017  1:50 AM 01/19/2017  8:08 PM Full Code [id number]  Etta Quill, DO ED        IV Access:    Peripheral IV   Procedures and diagnostic studies:   Ct Abdomen Pelvis W Contrast  Addendum Date: 08/15/2017   ADDENDUM REPORT: 08/15/2017 17:13 ADDENDUM: Findings discussed with Dr. Olevia Bowens. Electronically Signed  By: Dorise Bullion III M.D   On: 08/15/2017 17:13   Result Date: 08/15/2017 CLINICAL DATA:  Adenocarcinoma of the colon with metastases. Increasing abdominal and left back pain. EXAM: CT ABDOMEN AND PELVIS WITH CONTRAST TECHNIQUE: Multidetector CT imaging of the abdomen  and pelvis was performed using the standard protocol following bolus administration of intravenous contrast. CONTRAST:  190m ISOVUE-300 IOPAMIDOL (ISOVUE-300) INJECTION 61% COMPARISON:  Multiple CT scans since January 10, 2017 FINDINGS: Lower chest: Mild dependent atelectasis is identified. Lung bases are otherwise normal. Hepatobiliary: A small hemangioma is seen in the right hepatic lobe on series 2, image 16 is stable. No new suspicious hepatic masses are noted. The gallbladder is unremarkable. The portal vein remains patent. Pancreas: The mass near the tail of the pancreas measures 5.2 by 3.5 cm today versus 5 x 3.4 cm previously, similar in the interval. The mass invades the adjacent spleen, similar in the interval given difference in contrast timing. The previously identified pigtail catheter inferior to this mass has been removed. A tiny amount of fluid is seen in this region without abscess. Spleen: Invasion of the spleen with tumor as above, not significantly changed. Adrenals/Urinary Tract: The adrenal glands are normal. The kidneys are unremarkable with no obstruction. The bladder is normal. Stomach/Bowel: The stomach is normal. There dilated small bowel loops in the left side of the abdomen such as on series 2, image 40 measuring up to 4.9 cm. There is fecal material within the small bowel is well. There is a transition point seen in the right abdomen on series 2, images 41-45. There is a swirling of vessels in this region suggesting an internal hernia. The small bowel is somewhat decompressed more distally. There is a diverting colostomy in the right side of the abdomen. There is thickening of the colon extending from the distal transverse colon through the mid descending colon, consistent with the site of patient's known malignancy. The amount of thickening is similar in the interval. The appendix is not visualized but there is no secondary evidence of appendicitis. Vascular/Lymphatic: Atherosclerosis  in the distal aorta. No aneurysm or dissection. No retroperitoneal adenopathy. Reproductive: Prostate remains enlarged. Other: There is soft tissue medial to the colon on series 2, image 31 similar to slightly more prominent the interval measuring up to 3.2 cm. Musculoskeletal: The patient is status post surgery to the right hip. No other bony abnormalities. IMPRESSION: 1. Early or new small bowel obstruction with a transition point in the right abdomen as above. The adjacent swirling vessels are most consistent with an internal hernia. 2. The mass involving the pancreatic tail and spleen is similar in the interval. 3. The previously identified abscess inferior to the pancreatic mass has resolved with minimal fluid in this region. The tube is been removed. 4. Continued thickening of the colon as above consistent with the patient's known malignancy. 5. Atherosclerotic changes in the aorta. Aortic Atherosclerosis (ICD10-I70.0). Findings will be called to the referring clinical team. Electronically Signed: By: DDorise BullionIII M.D On: 08/15/2017 11:52     Medical Consultants:    None.  Anti-Infectives:   None  Subjective:    KJavier DockerCouser no new complains.  Objective:    Vitals:   08/15/17 0454 08/15/17 1400 08/15/17 2232 08/16/17 0523  BP: 111/81 122/89 (!) 117/57 110/68  Pulse: 97 82 95 100  Resp: _0 Temp: 98.5 F (36.9 C) 98.1 F (36.7 C) 98.1 F (36.7 C) 98.1 F (36.7 C)  TempSrc: Oral Oral Oral Oral  SpO2: 100% 100% 100% 100%  Weight:      Height:        Intake/Output Summary (Last 24 hours) at 08/16/17 1030 Last data filed at 08/16/17 0750  Gross per 24 hour  Intake          2300.33 ml  Output             2125 ml  Net           175.33 ml   Filed Weights   08/03/17 0500 08/06/17 0500 08/12/17 0519  Weight: 59.2 kg (130 lb 8.2 oz) 64.4 kg (141 lb 15.6 oz) 53.7 kg (118 lb 6.2 oz)    Exam: General exam: Cachectic, seems to be uncomfortable. Respiratory  system: Good air movement clear to auscultation. Cardiovascular system: Going rhythm with positive S1-S2. Gastrointestinal system:Left meniscus is distended, soft with no pain to palpation.  Central nervous system: Alert and oriented 3. Extremities: No lower extremity edema. Skin: No rashes. Psychiatry: Judgment and insight appeared normal   Data Reviewed:    Labs: Basic Metabolic Panel:  Recent Labs Lab 08/11/17 0835 08/12/17 0527 08/13/17 0526 08/14/17 0736 08/16/17 0549  NA 133* 134* 132* 133* 133*  K 5.2* 4.5 4.4 4.2 3.8  CL 101 102 101 101 101  CO2 _0 GLUCOSE 117* 101* 114* 113* 113*  BUN _1 CREATININE 0.60* 0.65 0.60* 0.64 0.58*  CALCIUM 9.3 9.6 9.4 9.4 9.4  MG 1.6* 1.6* 1.6* 1.5* 1.3*  PHOS  --  5.1* 4.2 4.7* 4.7*   GFR Estimated Creatinine Clearance: 76.4 mL/min (A) (by C-G formula based on SCr of 0.58 mg/dL (L)). Liver Function Tests:  Recent Labs Lab 08/10/17 0531 08/12/17 0527 08/16/17 0549  AST _2 ALT 13* 14* 12*  ALKPHOS 98 123 108  BILITOT 0.3 0.4 0.3  PROT 7.5 8.0 7.3  ALBUMIN 2.7* 2.8* 2.6*   No results for input(s): LIPASE, AMYLASE in the last 168 hours. No results for input(s): AMMONIA in the last 168 hours. Coagulation profile  Recent Labs Lab 08/12/17 0527  INR 1.02    CBC:  Recent Labs Lab 08/11/17 0155 08/11/17 0835 08/12/17 0527 08/13/17 1133 08/16/17 0549  WBC 8.6 6.9 7.3 6.4 7.4  NEUTROABS  --   --  4.7  --  5.4  HGB 8.2* 8.1* 8.6* 9.1* 8.6*  HCT 24.6* 24.5* 26.5* 27.5* 25.8*  MCV 84.8 85.7 85.5 84.6 83.5  PLT 123* 125* 151 171 163   Cardiac Enzymes: No results for input(s): CKTOTAL, CKMB, CKMBINDEX, TROPONINI in the last 168 hours. BNP (last 3 results) No results for input(s): PROBNP in the last 8760 hours. CBG: No results for input(s): GLUCAP in the last 168 hours. D-Dimer: No results for input(s): DDIMER in the last 72 hours. Hgb A1c: No results for input(s): HGBA1C in the  last 72 hours. Lipid Profile:  Recent Labs  08/16/17 0549  TRIG 145   Thyroid function studies: No results for input(s): TSH, T4TOTAL, T3FREE, THYROIDAB in the last 72 hours.  Invalid input(s): FREET3 Anemia work up: No results for input(s): VITAMINB12, FOLATE, FERRITIN, TIBC, IRON, RETICCTPCT in the last 72 hours. Sepsis Labs:  Recent Labs Lab 08/11/17 0835 08/12/17 0527 08/13/17 1133 08/16/17 0549  WBC 6.9 7.3 6.4 7.4   Microbiology No results found for this or any previous visit (from the past 240 hour(s)).   Medications:   . feeding  supplement (ENSURE ENLIVE)  237 mL Oral TID BM  . heparin subcutaneous  5,000 Units Subcutaneous Q8H  . pantoprazole (PROTONIX) IV  40 mg Intravenous Q12H  . sodium chloride flush  10-40 mL Intracatheter Q12H   Continuous Infusions: . dextrose 5 % and 0.45% NaCl 75 mL/hr at 08/16/17 0238  . Marland KitchenTPN (CLINIMIX-E) Adult 40 mL/hr at 08/15/17 1810      LOS: 18 days   Charlynne Cousins  [facility] Pager [phone]  *Please refer to [email or web address], password [email or web address] to get updated schedule on who will round on this patient, as hospitalists switch teams weekly. If 7PM-7AM, please contact night-coverage at [email or web address], password [email or web address] for any overnight needs.  08/16/2017, 10:30 AM

## 2017-08-16 NOTE — Progress Notes (Signed)
Calorie Count Note  48 hour calorie count ordered. Day 1 results below.  Diet: regular Supplements: Ensure Enlive po BID, each supplement provides 350 kcal and 20 grams of protein  8/19: Breakfast: 0% Lunch: 0% Dinner: 0% Supplements: 0%  Total intake: 0 kcal (0% of minimum estimated needs)  0g protein (0% of minimum estimated needs)  Estimated Nutritional Needs:  Kcal:  1880-2050 (33-36 kcal/kg) Protein:  85-97 grams (1.5-1.7 grams/kg)  Nutrition Dx: Malnutrition (severe) related to chronic illness, catabolic illness, cancer and cancer related treatments as evidenced by severe depletion of muscle mass, severe depletion of body fat, percent weight loss.  Goal: Pt to meet >/= 90% of their estimated nutrition needs   Intervention:  -Increase Ensure supplements to TID -Encouraged PO intake  -Continue TPN at half rate  Clayton Bibles, MS, RD, LDN Pager: [phone] After Hours Pager: [phone]

## 2017-08-16 NOTE — Consult Note (Signed)
Consultation Note Date: 08/16/2017   Patient Name: Jeff [name]  [name]: [birth year]  MRN: [id number]  Age / Sex: 56 y.o., male  PCP: [name], MD Referring Physician: Charlynne Cousins, MD  Reason for Consultation: Establishing goals of care  HPI/Patient Profile: 56 y.o. male  with past medical history of metastatic colon cancer, pancreatic abscess s/p drain, DVT admitted on 07/29/2017 with SBO.  Stent placement was unsuccessful and s/p surgery on 8/8 with prolonged ileus.  Palliative consulted for goals of care.   Clinical Assessment and Goals of Care: I met today with Jeff [name].  Values and goals of care important to patient and family were attempted to be elicited.  We discussed clinical course as well as wishes moving forward in regard to advanced directives.  Concepts specific to code status and care during and post-hospitalization discussed.    Questions and concerns addressed.   PMT will continue to support holistically as patient desires.  He declined further palliative follow-up as this time.  SUMMARY OF RECOMMENDATIONS   - We discussed that the goal of his care team is to help him to live as well as possible for as long as possible.  We discussed that there are times where this is in the form of further disease modifying therapy (such as chemo, radiation, and surgery), but this may also be through aggressive symptom management when we reach the point where he is no longer likely to add further time and quality to his life through further chemotherapy, radiation, or surgical interventions. - Jeff [name] remains pleasant throughout conversation, but he will not really engage with me in conversation other than discussion of continuing to "fight" and wanting to get to rehab to regain strength to be able to follow up with [name] to get more chemotherapy. - I left my card with him and he will call if  he desires to meet or discuss further.  Code Status/Advance Care Planning:  Full code  Palliative Prophylaxis:   Bowel Regimen and Frequent Pain Assessment  Additional Recommendations (Limitations, Scope, Preferences):  Full Scope Treatment  Psycho-social/Spiritual:   Desire for further Chaplaincy support:no  Additional Recommendations: Continued emotional support  Prognosis:   Unable to determine  Discharge Planning: SNF      Primary Diagnoses: Present on Admission: . Bowel obstruction ([place]) . Primary colon cancer with metastasis to other site [facility]) . Anemia, iron deficiency . Intra-abdominal abscess  . Pancreatic abscess . Hypomagnesemia . Hyponatremia   I have reviewed the medical record, interviewed the patient and family, and examined the patient. The following aspects are pertinent.  Past Medical History:  Diagnosis Date  . Acute deep vein thrombosis (DVT) of right lower extremity ([place]) 02/18/2017  . Malignant neoplasm of abdomen ([place])    [name] 01/11/2017  . Microcytic anemia    /notes 01/11/2017   Social History   Social History  . Marital status: Single    Spouse name: N/A  . Number of children: N/A  . Years of education: N/A  Social History Main Topics  . Smoking status: Never Smoker  . Smokeless tobacco: Never Used  . Alcohol use 3.6 oz/week    6 Cans of beer per week     Comment: nothing since the 14th of january  . Drug use: No  . Sexual activity: Not Currently   Other Topics Concern  . None   Social History Narrative   Single, lives alone   Daughter,[name] is primary caregiver   Family History  Problem Relation Age of Onset  . Cancer Mother    Scheduled Meds: . feeding supplement (ENSURE ENLIVE)  237 mL Oral TID BM  . heparin subcutaneous  5,000 Units Subcutaneous Q8H  . pantoprazole (PROTONIX) IV  40 mg Intravenous Q12H  . sodium chloride flush  10-40 mL Intracatheter Q12H   Continuous Infusions: . dextrose 5 %  and 0.45% NaCl 75 mL/hr at 08/16/17 0238  . Marland KitchenTPN (CLINIMIX-E) Adult     And  . fat emulsion    . Marland KitchenTPN (CLINIMIX-E) Adult 40 mL/hr at 08/15/17 1810   PRN Meds:.hydrALAZINE, HYDROmorphone (DILAUDID) injection, ipratropium-albuterol, ondansetron **OR** ondansetron (ZOFRAN) IV, oxyCODONE, phenol, sodium chloride flush Medications Prior to Admission:  Prior to Admission medications   Medication Sig Start Date End Date Taking? Authorizing Provider  clindamycin (CLINDAGEL) 1 % gel Apply topically 2 (two) times daily. 04/12/17  Yes [name], MD  Clindamycin Phos-Benzoyl Perox gel Apply 1 application topically 2 (two) times daily. 04/14/17  Yes [name], MD  ferrous sulfate 325 (65 FE) MG tablet Take 1 tablet (325 mg total) by mouth 2 (two) times daily with a meal. 03/29/17  Yes Curcio, Roselie Awkward, NP  hydrocortisone 2.5 % cream Apply topically 2 (two) times daily. 05/25/17  Yes [name], MD  lidocaine-prilocaine (EMLA) cream Apply to affected area once 01/27/17  Yes [name], MD  ondansetron (ZOFRAN) 8 MG tablet Take 1 tablet (8 mg total) by mouth 2 (two) times daily as needed for refractory nausea / vomiting. Start on day 3 after chemotherapy. 05/25/17  Yes [name], MD  Oxycodone HCl 10 MG TABS Take 1 tablet (10 mg total) by mouth every 6 (six) hours as needed (pain). 07/26/17 08/25/17 Yes [name], MD  prochlorperazine (COMPAZINE) 10 MG tablet Take 1 tablet (10 mg total) by mouth every 6 (six) hours as needed (Nausea or vomiting). 05/25/17  Yes [name], MD  rivaroxaban (XARELTO) 20 MG TABS tablet Take 1 tablet (20 mg total) by mouth daily with supper. 05/25/17  Yes [name], MD  loperamide (IMODIUM A-D) 2 MG tablet Take 1 tablet (2 mg total) by mouth 4 (four) times daily as needed for diarrhea or loose stools. Patient not taking: Reported on 07/29/2017 03/02/17   [name], MD  mirtazapine (REMERON) 15 MG tablet Take 1 tablet (15 mg total) by mouth at bedtime. Patient not taking: Reported on 07/29/2017  06/22/17   Gardenia Phlegm, NP   No Known Allergies Review of Systems  Constitutional: Positive for activity change, appetite change and fatigue.  Gastrointestinal: Positive for abdominal pain.  Musculoskeletal: Positive for back pain.  Neurological: Positive for weakness.  Psychiatric/Behavioral: Positive for sleep disturbance.    Physical Exam  General: Alert, awake, in no acute distress.  Heart: Slight tachycardia. No murmur appreciated. Lungs: Good air movement, clear Abdomen: Soft, midline incision, stool noted in bag,  Mildly distended, positive bowel sounds.  Ext: No significant edema Skin: Warm and dry Neuro: Grossly intact, nonfocal.  Vital Signs: BP 110/68 (  BP Location: Right Arm)   Pulse 100   Temp 98.1 F (36.7 C) (Oral)   Resp 16   Ht _0  (1.753 m)   Wt 53.7 kg (118 lb 6.2 oz)   SpO2 100%   BMI 17.48 kg/m  Pain Assessment: 0-10 POSS *See Group Information*: 1-Acceptable,Awake and alert Pain Score: 2    SpO2: SpO2: 100 % O2 Device:SpO2: 100 % O2 Flow Rate: .O2 Flow Rate (L/min): 2 L/min  IO: Intake/output summary:  Intake/Output Summary (Last 24 hours) at 08/16/17 1404 Last data filed at 08/16/17 1030  Gross per 24 hour  Intake          2410.33 ml  Output             1725 ml  Net           685.33 ml    LBM: Last BM Date: 08/14/17 Baseline Weight: Weight: 55.3 kg (121 lb 14.6 oz) Most recent weight: Weight: 53.7 kg (118 lb 6.2 oz)     Palliative Assessment/Data:     Time In: 1920 Time Out: 2020 Time Total: 60 Greater than 50%  of this time was spent counseling and coordinating care related to the above assessment and plan.  Signed by: Micheline Rough, MD   Please contact Palliative Medicine Team phone at [phone] for questions and concerns.  For individual provider: See Shea Evans

## 2017-08-16 NOTE — Progress Notes (Signed)
[facility]/Trauma Progress Note  12 Days Post-Op   Assessment/Plan SBO due to metastatic adenocarcinoma of colon- S/P Transverse loop colostomy 08/04/17 Dr. [name]  - resolved.  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) Abscess of the pancreatic tail- S/p perc drain; MULTIPLE ORGANISMS PRESENT, NONE PREDOMINANT, BACTEROIDES OVATUS , BETA LACTAMASE NEGATIVE on 07/11/17- IR did drain study 08/16, recommending repeat CT scan to evaluate site of abscess  - Repeat CT yesterday 08/19, showed possible internal hernia.    DVT RLE was on xarelto, was holding heparin with oozing midline abdominal wound and blood in ostomy.  No blood in ostomy anymore. subcutaneous heparin restarted.  Severe PCM- prealbumin 5.6 on 08/09/17; 1/2'd TPN. Calorie counts. Continue TNA until sure pt will be able to tolerate regular food again    FEN: TPN, regular diet.  VTE: SCD's, heparin ID: no abx currently Follow up: TBD  Plan: Repeat CT yesterday 08/19, showed possible internal hernia.  Pt's pain has improved greatly and he is having ostomy output again. Continue reg diet unless pt has nausea and vomiting. It is possible the obstruction has resolved since pt has clinically improved.  We will continue to follow. Would make sure pt can tolerate diet and continues to have ostomy output before discharge.    LOS: 18 days    Subjective:  CC: abdominal pain  Pain and bloating much improved from yesterday. Pt did not eat last night. He states he was not having ostomy output during the episode of pain. No vomiting. He is feeling better. Discussed restarting subcutaneous heparin with the pt.   Objective: Vital signs in last 24 hours: Temp:  [98.1 F (36.7 C)] 98.1 F (36.7 C) (08/20 0523) Pulse Rate:  [82-100] 100 (08/20 0523) Resp:  [16] 16 (08/20 0523) BP: (110-122)/(57-89) 110/68 (08/20 0523) SpO2:  [100 %] 100 % (08/20 0523) Last BM  Date: 08/14/17  Intake/Output from previous day: 08/19 0701 - 08/20 0700 In: 2460.3 [I.V.:2460.3] Out: 1675 [Urine:1125; Stool:550] Intake/Output this shift: Total I/O In: -  Out: 450 [Stool:450]  PE: Gen: Alert, NAD, pleasant, cooperative, well appearing Cardio: mildly tachycardic, normal S1 and S2 Pulm: Rate andeffort normal Abd: Soft, mild distention, very mild TTP tender lower abdomen, stool and gas in bag, midline incision C/D/I Skin: no rashes noted, warm and dry   Anti-infectives: Anti-infectives    Start     Dose/Rate Route Frequency Ordered Stop   08/04/17 2200  piperacillin-tazobactam (ZOSYN) IVPB 3.375 g     3.375 g 12.5 mL/hr over 240 Minutes Intravenous Every 8 hours 08/04/17 1706 08/05/17 2244   08/04/17 0951  cefoTEtan in Dextrose 5% (CEFOTAN) 2-2.08 GM-% IVPB    Comments:  Algis Liming   : cabinet override      08/04/17 0951 08/04/17 1102   08/04/17 0945  cefoTEtan in Dextrose 5% (CEFOTAN) IVPB 2 g     2 g Intravenous  Once 08/04/17 0820 08/04/17 1117   08/04/17 0800  cefoTEtan (CEFOTAN) 2 g in dextrose 5 % 50 mL IVPB  Status:  Discontinued     2 g 100 mL/hr over 30 Minutes Intravenous On call to O.R. 08/04/17 0746 08/04/17 0819   07/29/17 2359  piperacillin-tazobactam (ZOSYN) IVPB 3.375 g  Status:  Discontinued     3.375 g 12.5 mL/hr over 240 Minutes Intravenous Every 8 hours 07/29/17 1729 08/04/17 1706   07/29/17 1730  piperacillin-tazobactam (ZOSYN) IVPB 3.375 g  3.375 g 100 mL/hr over 30 Minutes Intravenous  Once 07/29/17 1728 07/29/17 1853      Lab Results:   Recent Labs  08/13/17 1133 08/16/17 0549  WBC 6.4 7.4  HGB 9.1* 8.6*  HCT 27.5* 25.8*  PLT 171 163   BMET  Recent Labs  08/14/17 0736 08/16/17 0549  NA 133* 133*  K 4.2 3.8  CL 101 101  CO2 25 25  GLUCOSE 113* 113*  BUN 14 10  CREATININE 0.64 0.58*  CALCIUM 9.4 9.4   PT/INR No results for input(s): LABPROT, INR in the last 72 hours. CMP     Component Value  Date/Time   NA 133 (L) 08/16/2017 0549   NA 135 (L) 07/26/2017 1014   K 3.8 08/16/2017 0549   K 3.3 (L) 07/26/2017 1014   CL 101 08/16/2017 0549   CO2 25 08/16/2017 0549   CO2 25 07/26/2017 1014   GLUCOSE 113 (H) 08/16/2017 0549   GLUCOSE 129 07/26/2017 1014   BUN 10 08/16/2017 0549   BUN 9.8 07/26/2017 1014   CREATININE 0.58 (L) 08/16/2017 0549   CREATININE 1.0 07/26/2017 1014   CALCIUM 9.4 08/16/2017 0549   CALCIUM 9.3 07/26/2017 1014   PROT 7.3 08/16/2017 0549   PROT 8.2 07/26/2017 1014   ALBUMIN 2.6 (L) 08/16/2017 0549   ALBUMIN 3.3 (L) 07/26/2017 1014   AST 24 08/16/2017 0549   AST 24 07/26/2017 1014   ALT 12 (L) 08/16/2017 0549   ALT 6 07/26/2017 1014   ALKPHOS 108 08/16/2017 0549   ALKPHOS 87 07/26/2017 1014   BILITOT 0.3 08/16/2017 0549   BILITOT 0.23 07/26/2017 1014   GFRNONAA >60 08/16/2017 0549   GFRAA >60 08/16/2017 0549   Lipase     Component Value Date/Time   LIPASE 14 07/29/2017 1122    Studies/Results: Ct Abdomen Pelvis W Contrast  Addendum Date: 08/15/2017   ADDENDUM REPORT: 08/15/2017 17:13 ADDENDUM: Findings discussed with Dr. Olevia Bowens. Electronically Signed   By: Dorise Bullion III M.D   On: 08/15/2017 17:13   Result Date: 08/15/2017 CLINICAL DATA:  Adenocarcinoma of the colon with metastases. Increasing abdominal and left back pain. EXAM: CT ABDOMEN AND PELVIS WITH CONTRAST TECHNIQUE: Multidetector CT imaging of the abdomen and pelvis was performed using the standard protocol following bolus administration of intravenous contrast. CONTRAST:  166mL ISOVUE-300 IOPAMIDOL (ISOVUE-300) INJECTION 61% COMPARISON:  Multiple CT scans since January 10, 2017 FINDINGS: Lower chest: Mild dependent atelectasis is identified. Lung bases are otherwise normal. Hepatobiliary: A small hemangioma is seen in the right hepatic lobe on series 2, image 16 is stable. No new suspicious hepatic masses are noted. The gallbladder is unremarkable. The portal vein remains patent.  Pancreas: The mass near the tail of the pancreas measures 5.2 by 3.5 cm today versus 5 x 3.4 cm previously, similar in the interval. The mass invades the adjacent spleen, similar in the interval given difference in contrast timing. The previously identified pigtail catheter inferior to this mass has been removed. A tiny amount of fluid is seen in this region without abscess. Spleen: Invasion of the spleen with tumor as above, not significantly changed. Adrenals/Urinary Tract: The adrenal glands are normal. The kidneys are unremarkable with no obstruction. The bladder is normal. Stomach/Bowel: The stomach is normal. There dilated small bowel loops in the left side of the abdomen such as on series 2, image 40 measuring up to 4.9 cm. There is fecal material within the small bowel is well. There is a transition  point seen in the right abdomen on series 2, images 41-45. There is a swirling of vessels in this region suggesting an internal hernia. The small bowel is somewhat decompressed more distally. There is a diverting colostomy in the right side of the abdomen. There is thickening of the colon extending from the distal transverse colon through the mid descending colon, consistent with the site of patient's known malignancy. The amount of thickening is similar in the interval. The appendix is not visualized but there is no secondary evidence of appendicitis. Vascular/Lymphatic: Atherosclerosis in the distal aorta. No aneurysm or dissection. No retroperitoneal adenopathy. Reproductive: Prostate remains enlarged. Other: There is soft tissue medial to the colon on series 2, image 31 similar to slightly more prominent the interval measuring up to 3.2 cm. Musculoskeletal: The patient is status post surgery to the right hip. No other bony abnormalities. IMPRESSION: 1. Early or new small bowel obstruction with a transition point in the right abdomen as above. The adjacent swirling vessels are most consistent with an internal  hernia. 2. The mass involving the pancreatic tail and spleen is similar in the interval. 3. The previously identified abscess inferior to the pancreatic mass has resolved with minimal fluid in this region. The tube is been removed. 4. Continued thickening of the colon as above consistent with the patient's known malignancy. 5. Atherosclerotic changes in the aorta. Aortic Atherosclerosis (ICD10-I70.0). Findings will be called to the referring clinical team. Electronically Signed: By: Dorise Bullion III M.D On: 08/15/2017 11:52      Kalman Drape , [facility] Surgery 08/16/2017, 8:39 AM Pager: [phone] Consults: [phone] Mon-Fri 7:00 am-4:30 pm Sat-Sun 7:00 am-11:30 am  Agree with above. Better than yesterday. Does not have a lot of insight into his disease.  Unmarried, 6 children (Daughter, Rozanna Boer - has POA)  Alphonsa Overall, MD, [facility] Surgery Pager: [phone] Office phone:  [phone]

## 2017-08-16 NOTE — Progress Notes (Signed)
PHARMACY - ADULT TOTAL PARENTERAL NUTRITION CONSULT NOTE   Pharmacy Consult for TPN Indication: SBO  Patient Measurements: Height: 5\' 9"  (175.3 cm) Weight: 118 lb 6.2 oz (53.7 kg) IBW/kg (Calculated) : 70.7 TPN AdjBW (KG): 55.3 Body mass index is 17.48 kg/m.   Insulin Requirements:  DC'd SSI/CBGs 8/11  Current Nutrition: regular diet but patient not eating anything; TPN; ensure tid ordered on 8/20 (not taking)  IVF: D5 1/2NS at 75 ml/hr  Central access: PICC TPN start date: 8/7  ASSESSMENT                                                                                                          HPI:  56 y.o.malewith medical hx significant for metastatic colon cancer, on chemo, pancreatic abscess with percutaneous drain, iron deficiency anemia, and hx of right leg DVT, on Xarelto.  Presented to the ED for evaluation of 3 days of abdominal pain with nausea, vomiting, and anorexia. CT of the abdomen and pelvis at the time of admission revealed a bowel obstruction at the level of the descending colon at the location of the percutaneous drainage catheter.  Significant events:  8/7: Sigmoidoscopy- Malignant completely obstructing tumor in the                                    descending colon. 8/8: Transverse Loop Colostomy 8/14: starting clear liquid diet; clamp NG tube 8/15: full liquid diet 8/16: reduce TPN to 1/2 per surgery, regular diet ordered 8/17-8/19: po intake minimal, per discussion with MD - planning calorie count 8/19: calorie count done - patient ate 0%  Today: no labs  Glucose (goal <150): no hx DM;  SSI and CBGs stopped on 8/11, serum glucose ok  Electrolytes: Na low 133 but stable, Phos high 4.7 and also stable, Mg low 1.3 and dropped  Renal: SCr WNL, stable  LFTs: ALT low  TGs: 445 (8/8), 29 (8/9), 83 (8/13), 145 (8/20)  Prealbumin - 6.4 (8/4), 9.8 (8/8), 5.6 (8/13), 18.2 greatly improved (8/20)  I/O (+) 785 ml ;  colostomy 550 ml   NUTRITIONAL  GOALS                                                                                             RD recs (8/10): Kcal:  1880-2050 (33-36 kcal/kg) Protein:  85-97 grams (1.5-1.7 grams/kg)  Clinimix E 5/20 at a goal rate of 81ml/hr + 20% fat emulsion at 1ml/hr x 12 hours  to provide: 90 g/day protein, 2064 Kcal/day.  PLAN  Magnesium 2g IV x 1 today   At 1800 today:  Continue Clinimix E 5/20 at 40 ml/hr BUT if patient does not begin to tolerate diet in next day or so will need to increase rate of TPN toward goal rate of 75 ml/hr  Continue 20% fat emulsion at 37ml/hr x 12 hours daily  TPN to contain standard multivitamins and trace elements.   mIVF per MD  TPN lab panels on Mondays & Thursdays - monitor phos closely since just above normal range  F/u oral intake and ability to stop TPN   Adrian Saran, PharmD, BCPS Pager [phone] 08/16/2017 10:39 AM

## 2017-08-16 NOTE — NC FL2 (Addendum)
[place] LEVEL OF CARE SCREENING TOOL     IDENTIFICATION  Patient Name: Jeff [name] Birthdate: [birth year] Sex: male Admission Date (Current Location): 07/29/2017  [facility] and [place] Number:  Herbalist and Address:  [facility],  [place], [place]      Provider Number: [id number]  Attending Physician Name and Address:  Charlynne Cousins, MD  Relative Name and Phone Number:       Current Level of Care: Hospital Recommended Level of Care: [place] Prior Approval Number:    Date Approved/Denied:   PASRR Number: [id number]  Discharge Plan: SNF    Current Diagnoses: Patient Active Problem List   Diagnosis Date Noted  . Severe malnutrition ([place]) 07/31/2017  . Colon obstruction ([place]) 07/31/2017  . Malnutrition of moderate degree 07/30/2017  . Bowel obstruction ([place]) 07/29/2017  . Malignant neoplasm of splenic flexure ([place])   . 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 Diarrhea 07/10/2017  . Abdominal pain 07/10/2017  . Sepsis ([place]) 07/10/2017  . Hyponatremia 07/10/2017  . Depression 07/10/2017  . Chronic anticoagulation 07/10/2017  . On antineoplastic chemotherapy 07/10/2017  . 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 Intra-abdominal abscess  07/10/2017  . Hypokalemia 07/10/2017  . Hypomagnesemia 07/10/2017  . 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 Port catheter in place 03/02/2017  . Goals of care, counseling/discussion 02/20/2017  . History of deep vein thrombosis (DVT) of lower extremity 02/18/2017  . Anemia, iron deficiency 01/27/2017  . Protein-calorie malnutrition, severe 01/13/2017  . Primary colon cancer with metastasis to other site ([place]) 01/12/2017    Orientation RESPIRATION BLADDER Height & Weight     Self, Time, Situation, Place  Normal Continent Weight: 53.7 kg (118 lb 6.2 oz) Height:  5\' 9"  (175.3 cm)   BEHAVIORAL SYMPTOMS/MOOD NEUROLOGICAL BOWEL NUTRITION STATUS  Other (Comment) (no behaviors)   Colostomy REGULAR  AMBULATORY STATUS COMMUNICATION OF NEEDS Skin   Limited Assist Verbally Surgical wounds                       Personal Care Assistance Level of Assistance  Bathing, Feeding, Dressing Bathing Assistance: Limited assistance Feeding assistance: Independent Dressing Assistance: Limited assistance     Functional Limitations Info  Sight, Hearing, Speech Sight Info: Adequate Hearing Info: Adequate Speech Info: Adequate    SPECIAL CARE FACTORS FREQUENCY  PT (By licensed PT), OT (By licensed OT)     PT Frequency: 5x wk OT Frequency: 5x wk            Contractures Contractures Info: Not present    Additional Factors Info  Code Status Code Status Info: Full Code             Current Medications (08/16/2017):  This is the current hospital active medication list Current Facility-Administered Medications  Medication Dose Route Frequency Provider Last Rate Last Dose  . dextrose 5 %-0.45 % sodium chloride infusion   Intravenous Continuous Charlynne Cousins, MD 75 mL/hr at 08/16/17 [phone]    . TPN (CLINIMIX-E) Adult   Intravenous Continuous TPN Adrian Saran, [facility]       And  . fat emulsion 20 % infusion 240 mL  240 mL Intravenous Continuous TPN Adrian Saran, [facility]      . feeding supplement (ENSURE ENLIVE) (ENSURE ENLIVE) liquid 237 mL  237 mL Oral TID BM Charlynne Cousins, MD      .  heparin injection 5,000 Units  5,000 Units Subcutaneous Q8H Charlynne Cousins, MD   5,000 Units at 08/16/17 0536  . hydrALAZINE (APRESOLINE) injection 5 mg  5 mg Intravenous Q6H PRN Orson Eva, MD   5 mg at 08/06/17 0242  . HYDROmorphone (DILAUDID) injection 0.5-2 mg  0.5-2 mg Intravenous Q2H PRN Stark Klein, MD   2 mg at 08/16/17 0858  . ipratropium-albuterol (DUONEB) 0.5-2.5 (3) MG/3ML nebulizer solution 3 mL  3 mL Nebulization Q6H PRN Caren Griffins, MD   3 mL at  08/06/17 1112  . magnesium sulfate IVPB 2 g 50 mL  2 g Intravenous Once Adrian Saran, RPH      . ondansetron [facility]) tablet 4 mg  4 mg Oral Q6H PRN Opyd, Ilene Qua, MD       Or  . ondansetron (ZOFRAN) injection 4 mg  4 mg Intravenous Q6H PRN Opyd, Ilene Qua, MD   4 mg at 07/31/17 1722  . oxyCODONE (Oxy IR/ROXICODONE) immediate release tablet 15 mg  15 mg Oral Q3H PRN Charlynne Cousins, MD      . pantoprazole (PROTONIX) injection 40 mg  40 mg Intravenous Luz Lex, MD   40 mg at 08/16/17 0902  . phenol (CHLORASEPTIC) mouth spray 1 spray  1 spray Mouth/Throat PRN Opyd, Ilene Qua, MD   1 spray at 07/30/17 0025  . sodium chloride flush (NS) 0.9 % injection 10-40 mL  10-40 mL Intracatheter Q12H Opyd, Ilene Qua, MD   10 mL at 08/16/17 0905  . sodium chloride flush (NS) 0.9 % injection 10-40 mL  10-40 mL Intracatheter PRN Opyd, Ilene Qua, MD   10 mL at 08/11/17 1809  . TPN (CLINIMIX-E) Adult   Intravenous Continuous TPN Emiliano Dyer, RPH 40 mL/hr at 08/15/17 1810     Facility-Administered Medications Ordered in Other Encounters  Medication Dose Route Frequency Provider Last Rate Last Dose  . 0.9 %  sodium chloride infusion   Intravenous Once [name], MD      . sodium chloride flush (NS) 0.9 % injection 10 mL  10 mL Intracatheter PRN [name], MD   10 mL at 07/08/17 1356     Discharge Medications: Please see discharge summary for a list of discharge medications.  Relevant Imaging Results:  Relevant Lab Results:   Additional Information SS # [id number]. Pt has upcoming palliative oncology appts. ( 08/16/17 [facility] ).  Nikolai Wilczak, Randall An, LCSW

## 2017-08-16 NOTE — Progress Notes (Signed)
Patient states he is in pain and regarding Percocet "that stuff doesn't work and I am not taking it; can you give me something else?". Dilaudid IV given and note left for MD to review and possibly order something else PO for patient. Patient has metastatic cancer.  Donne Hazel, RN

## 2017-08-16 NOTE — Progress Notes (Signed)
Plan for d/c to SNF, discharge planning per CSW. [phone] 

## 2017-08-17 ENCOUNTER — Telehealth: Payer: Self-pay

## 2017-08-17 DIAGNOSIS — Z0189 Encounter for other specified special examinations: Secondary | ICD-10-CM

## 2017-08-17 DIAGNOSIS — Z4803 Encounter for change or removal of drains: Secondary | ICD-10-CM

## 2017-08-17 DIAGNOSIS — E871 Hypo-osmolality and hyponatremia: Secondary | ICD-10-CM

## 2017-08-17 DIAGNOSIS — E43 Unspecified severe protein-calorie malnutrition: Secondary | ICD-10-CM

## 2017-08-17 LAB — PHOSPHORUS: PHOSPHORUS: 4.4 mg/dL (ref 2.5–4.6)

## 2017-08-17 LAB — BASIC METABOLIC PANEL
ANION GAP: 6 (ref 5–15)
BUN: 10 mg/dL (ref 6–20)
CHLORIDE: 104 mmol/L (ref 101–111)
CO2: 26 mmol/L (ref 22–32)
Calcium: 9.3 mg/dL (ref 8.9–10.3)
Creatinine, Ser: 0.58 mg/dL — ABNORMAL LOW (ref 0.61–1.24)
GFR calc Af Amer: >60 mL/min (ref >60)
GLUCOSE: 118 mg/dL — AB (ref 65–99)
POTASSIUM: 4 mmol/L (ref 3.5–5.1)
SODIUM: 136 mmol/L (ref 135–145)

## 2017-08-17 LAB — MAGNESIUM: Magnesium: 1.4 mg/dL — ABNORMAL LOW (ref 1.7–2.4)

## 2017-08-17 MED ORDER — MAGNESIUM OXIDE 400 (241.3 MG) MG PO TABS
400.0000 mg | ORAL_TABLET | Freq: Two times a day (BID) | ORAL | Status: DC
  Administered 2017-08-17: 400 mg via ORAL
  Filled 2017-08-17: qty 1

## 2017-08-17 MED ORDER — OXYCODONE HCL 10 MG PO TABS: 10.0000 mg | ORAL_TABLET | Freq: Four times a day (QID) | ORAL | 0 refills | Status: DC | PRN

## 2017-08-17 MED ORDER — ENSURE ENLIVE PO LIQD: 237.0000 mL | Freq: Three times a day (TID) | ORAL | 12 refills | Status: DC

## 2017-08-17 MED ORDER — MAGNESIUM SULFATE 2 GM/50ML IV SOLN
2.0000 g | Freq: Once | INTRAVENOUS | Status: AC
  Administered 2017-08-17: 2 g via INTRAVENOUS
  Filled 2017-08-17: qty 50

## 2017-08-17 MED ORDER — HEPARIN SOD (PORK) LOCK FLUSH 100 UNIT/ML IV SOLN
500.0000 [IU] | INTRAVENOUS | Status: AC | PRN
  Administered 2017-08-17: 500 [IU]

## 2017-08-17 NOTE — Progress Notes (Signed)
PHARMACY - ADULT TOTAL PARENTERAL NUTRITION CONSULT NOTE   Pharmacy Consult for TPN Indication: SBO  Patient Measurements: Height: 5\' 9"  (175.3 cm) Weight: 118 lb 6.2 oz (53.7 kg) IBW/kg (Calculated) : 70.7 TPN AdjBW (KG): 55.3 Body mass index is 17.48 kg/m.   Insulin Requirements:  None, no CBG checks  Current Nutrition: soft diet; intake improved today  IVF: D5-1/2NS at 75 ml/hr  Central access: PICC TPN start date: 8/7  ASSESSMENT                                                                                                          HPI:  1 yoMwith medical hx significant for metastatic colon cancer, on chemo, pancreatic abscess with percutaneous drain, iron deficiency anemia, and hx of right leg DVT, on Xarelto.  Presented to the ED for evaluation of 3 days of abdominal pain with nausea, vomiting, and anorexia. CT of the abdomen and pelvis at the time of admission revealed a bowel obstruction at the level of the descending colon at the location of the percutaneous drainage catheter.  Significant events:  8/7: Sigmoidoscopy- Malignant completely obstructing tumor in the                                    descending colon. 8/8: Transverse Loop Colostomy 8/14: starting clear liquid diet; clamp NG tube 8/15: full liquid diet 8/16: reduce TPN to 1/2 per surgery, regular diet ordered 8/17-8/19: po intake minimal, per discussion with MD - planning calorie count 8/19: calorie count done - patient ate 0%  Today:   Glucose: consistently at goal (< 150); no hx DM; no longer checking CBGs  Electrolytes: Na and Phos improved to WNL, Mg low but improved from yesterday after supplementation; all others stable WNL.  Renal: SCr low-normal; stable  LFTs: Albumin low as expected, otherwise WNL  TGs: High on admission, now WNL but rising slightly, still < 150  Prealbumin: Previously low, now improved to WNL  I/O -8L total, 1.1 L from colostomy yesterday   NUTRITIONAL GOALS                                                                                              RD recs (8/10): Kcal:  1880-2050 (33-36 kcal/kg) Protein:  85-97 grams (1.5-1.7 grams/kg)  Clinimix E 5/20 at a goal rate of 60ml/hr + 20% fat emulsion at 43ml/hr x 12 hours  to provide: 90 g/day protein, 2064 Kcal/day.  PLAN  Magnesium 2g IV x 1 today, MD adding oral supplementation  Plan for discharge today  Clinimix already running at low rate, will stop when bag completed at 1800 today   Reuel Boom, PharmD, BCPS Pager: [phone] 08/17/2017, 10:04 AM

## 2017-08-17 NOTE — Telephone Encounter (Signed)
Chart review indicates client will be discharged to SNF today ,[facility] .TC to [facility] program and SW to decide on plans for client and next steps . SW will talk with daughter and get a sense of what they are expecting.

## 2017-08-17 NOTE — Progress Notes (Addendum)
[facility]/Trauma Progress Note  13 Days Post-Op   Assessment/Plan SBO due to metastatic adenocarcinoma of colon- S/P Transverse loop colostomy 08/04/17 Dr. [name]             - resolved. Adenocarcinoma of the colon w/ mets to spleen/pancreas -              on Chemotherapy 07/08/17 ([name]) Abscess of the pancreatic tail- S/p perc drain; MULTIPLE ORGANISMS PRESENT, NONE PREDOMINANT, BACTEROIDES OVATUS , BETA LACTAMASE NEGATIVE on 07/11/17- IR did drain study 08/16, recommending repeat CT scan to evaluate site of abscess             - Repeat CT yesterday 08/19, showed possible internal hernia.   DVT RLE was on xarelto, was holding heparin with oozing midline abdominal wound and blood in ostomy. No blood in ostomy anymore. subcutaneous heparin restarted.  Severe PCM- prealbumin 5.6 on 08/09/17; Calorie counts. Can stop TNA  FEN: regular diet.  VTE: SCD's, heparin ID: no abx currently Follow up: TBD  Plan: Pt tolerating diet, no abdominal pain. It is okay to stop TPN. Pt is clear for discharge from a surgical standpoint. Okay to restart Xarelto from a surgical standpoint. No signs of bleeding on subq heparin.   Agree with above. Having some abdominal pain again - like Sunday.  Not sure what it means ... CT scan on Sunday did not reveal anything new.  Okay to go to rehab. DN   LOS: 19 days    Subjective:  CC: S/P diverting colostomy  No abdominal pain. Tolerating diet. Good ostomy output. No new complaints.   Objective: Vital signs in last 24 hours: Temp:  [98.1 F (36.7 C)-99 F (37.2 C)] 98.1 F (36.7 C) (08/21 0434) Pulse Rate:  [99-102] 99 (08/21 0434) Resp:  [15-16] 16 (08/21 0434) BP: (101-109)/(66-74) 105/74 (08/21 0434) SpO2:  [100 %] 100 % (08/21 0434) Last BM Date: 08/14/17  Intake/Output from previous day: 08/20 0701 - 08/21 0700 In: 3465.6 [P.O.:930; I.V.:2535.6] Out: 3400 [Urine:2275; Stool:1125] Intake/Output this shift: No  intake/output data recorded.  PE: Gen: Alert, NAD, pleasant, cooperative, well appearing Cardio: RRR, normal S1 and S2 Pulm: Rate andeffort normal Abd: Soft, no distention, no TTP, stool and gas in bag, midline incision C/D/I Skin: no rashes noted, warm and dry    Anti-infectives: Anti-infectives    Start     Dose/Rate Route Frequency Ordered Stop   08/04/17 2200  piperacillin-tazobactam (ZOSYN) IVPB 3.375 g     3.375 g 12.5 mL/hr over 240 Minutes Intravenous Every 8 hours 08/04/17 1706 08/05/17 2244   08/04/17 0951  cefoTEtan in Dextrose 5% (CEFOTAN) 2-2.08 GM-% IVPB    Comments:  Jones, Sherrie   : cabinet override      08 /08/18 0951 08/04/17 1102   08/04/17 0945  cefoTEtan in Dextrose 5% (CEFOTAN) IVPB 2 g     2 g Intravenous  Once 08/04/17 0820 08/04/17 1117   08/04/17 0800  cefoTEtan (CEFOTAN) 2 g in dextrose 5 % 50 mL IVPB  Status:  Discontinued     2 g 100 mL/hr over 30 Minutes Intravenous On call to O.R. 08/04/17 0746 08/04/17 0819   07/29/17 2359  piperacillin-tazobactam (ZOSYN) IVPB 3.375 g  Status:  Discontinued     3.375 g 12.5 mL/hr over 240 Minutes Intravenous Every 8 hours 07/29/17 1729 08/04/17 1706   07/29/17 1730  piperacillin-tazobactam (ZOSYN) IVPB 3.375 g     3.375 g 100 mL/hr over 30 Minutes Intravenous  Once 07/29/17 1728 07/29/17 1853      Lab Results:   Recent Labs  08/16/17 0549  WBC 7.4  HGB 8.6*  HCT 25.8*  PLT 163   BMET  Recent Labs  08/16/17 0549 08/17/17 0713  NA 133* 136  K 3.8 4.0  CL 101 104  CO2 25 26  GLUCOSE 113* 118*  BUN 10 10  CREATININE 0.58* 0.58*  CALCIUM 9.4 9.3   PT/INR No results for input(s): LABPROT, INR in the last 72 hours. CMP     Component Value Date/Time   NA 136 08/17/2017 0713   NA 135 (L) 07/26/2017 1014   K 4.0 08/17/2017 0713   K 3.3 (L) 07/26/2017 1014   CL 104 08/17/2017 0713   CO2 26 08/17/2017 0713   CO2 25 07/26/2017 1014   GLUCOSE 118 (H) 08/17/2017 0713   GLUCOSE 129 07/26/2017  1014   BUN 10 08/17/2017 0713   BUN 9.8 07/26/2017 1014   CREATININE 0.58 (L) 08/17/2017 0713   CREATININE 1.0 07/26/2017 1014   CALCIUM 9.3 08/17/2017 0713   CALCIUM 9.3 07/26/2017 1014   PROT 7.3 08/16/2017 0549   PROT 8.2 07/26/2017 1014   ALBUMIN 2.6 (L) 08/16/2017 0549   ALBUMIN 3.3 (L) 07/26/2017 1014   AST 24 08/16/2017 0549   AST 24 07/26/2017 1014   ALT 12 (L) 08/16/2017 0549   ALT 6 07/26/2017 1014   ALKPHOS 108 08/16/2017 0549   ALKPHOS 87 07/26/2017 1014   BILITOT 0.3 08/16/2017 0549   BILITOT 0.23 07/26/2017 1014   GFRNONAA >60 08/17/2017 0713   GFRAA >60 08/17/2017 0713   Lipase     Component Value Date/Time   LIPASE 14 07/29/2017 1122    Studies/Results: Ct Abdomen Pelvis W Contrast  Addendum Date: 08/15/2017   ADDENDUM REPORT: 08/15/2017 17:13 ADDENDUM: Findings discussed with Dr. Olevia Bowens. Electronically Signed   By: Dorise Bullion III M.D   On: 08/15/2017 17:13   Result Date: 08/15/2017 CLINICAL DATA:  Adenocarcinoma of the colon with metastases. Increasing abdominal and left back pain. EXAM: CT ABDOMEN AND PELVIS WITH CONTRAST TECHNIQUE: Multidetector CT imaging of the abdomen and pelvis was performed using the standard protocol following bolus administration of intravenous contrast. CONTRAST:  140mL ISOVUE-300 IOPAMIDOL (ISOVUE-300) INJECTION 61% COMPARISON:  Multiple CT scans since January 10, 2017 FINDINGS: Lower chest: Mild dependent atelectasis is identified. Lung bases are otherwise normal. Hepatobiliary: A small hemangioma is seen in the right hepatic lobe on series 2, image 16 is stable. No new suspicious hepatic masses are noted. The gallbladder is unremarkable. The portal vein remains patent. Pancreas: The mass near the tail of the pancreas measures 5.2 by 3.5 cm today versus 5 x 3.4 cm previously, similar in the interval. The mass invades the adjacent spleen, similar in the interval given difference in contrast timing. The previously identified pigtail  catheter inferior to this mass has been removed. A tiny amount of fluid is seen in this region without abscess. Spleen: Invasion of the spleen with tumor as above, not significantly changed. Adrenals/Urinary Tract: The adrenal glands are normal. The kidneys are unremarkable with no obstruction. The bladder is normal. Stomach/Bowel: The stomach is normal. There dilated small bowel loops in the left side of the abdomen such as on series 2, image 40 measuring up to 4.9 cm. There is fecal material within the small bowel is well. There is a transition point seen in the right abdomen on series 2, images 41-45. There is a swirling of  vessels in this region suggesting an internal hernia. The small bowel is somewhat decompressed more distally. There is a diverting colostomy in the right side of the abdomen. There is thickening of the colon extending from the distal transverse colon through the mid descending colon, consistent with the site of patient's known malignancy. The amount of thickening is similar in the interval. The appendix is not visualized but there is no secondary evidence of appendicitis. Vascular/Lymphatic: Atherosclerosis in the distal aorta. No aneurysm or dissection. No retroperitoneal adenopathy. Reproductive: Prostate remains enlarged. Other: There is soft tissue medial to the colon on series 2, image 31 similar to slightly more prominent the interval measuring up to 3.2 cm. Musculoskeletal: The patient is status post surgery to the right hip. No other bony abnormalities. IMPRESSION: 1. Early or new small bowel obstruction with a transition point in the right abdomen as above. The adjacent swirling vessels are most consistent with an internal hernia. 2. The mass involving the pancreatic tail and spleen is similar in the interval. 3. The previously identified abscess inferior to the pancreatic mass has resolved with minimal fluid in this region. The tube is been removed. 4. Continued thickening of the  colon as above consistent with the patient's known malignancy. 5. Atherosclerotic changes in the aorta. Aortic Atherosclerosis (ICD10-I70.0). Findings will be called to the referring clinical team. Electronically Signed: By: Dorise Bullion III M.D On: 08/15/2017 11:52    Kalman Drape , [facility] 08/17/2017, 9:52 AM Pager: [phone] Consults: [phone]  Alphonsa Overall, MD, [facility] Surgery Pager: [phone] Office phone:  [phone]

## 2017-08-17 NOTE — Clinical Social Work Placement (Addendum)
   CLINICAL SOCIAL WORK PLACEMENT  NOTE  Date:  08/17/2017  Patient Details  Name: Jeff [name] MRN: [id number] Date of Birth: 08/22/1961  Clinical Social Work is seeking post-discharge placement for this patient at the [place] level of care (*CSW will initial, date and re-position this form in  chart as items are completed):  Yes   Patient/family provided with [place] Work Department's list of facilities offering this level of care within the geographic area requested by the patient (or if unable, by the patient's family).  Yes   Patient/family informed of their freedom to choose among providers that offer the needed level of care, that participate in Medicare, Medicaid or managed care program needed by the patient, have an available bed and are willing to accept the patient.  Yes   Patient/family informed of [place]'s ownership interest in [facility] and [facility], as well as of the fact that they are under no obligation to receive care at these facilities.  PASRR submitted to EDS on 08/16/17     PASRR number received on 08/16/17     Existing PASRR number confirmed on       FL2 transmitted to all facilities in geographic area requested by pt/family on 08/16/17     FL2 transmitted to all facilities within larger geographic area on       Patient informed that his/her managed care company has contracts with or will negotiate with certain facilities, including the following:        Yes   Patient/family informed of bed offers received.  Patient chooses bed at [facility]     Physician recommends and patient chooses bed at      Patient to be transferred [facility] on 08/17/17.  Patient to be transferred to facility by [facility]     Patient family notified on 08/17/17 of transfer.  Name of family member notified:  daughter     PHYSICIAN       Additional Comment: Pt has chosen  [facility] for SNF placement. VM left for pt's daughter. [facility] transport required. Medical necessity form completed. DC Summary sent to SNF for review. Script included in dc packet. # for report provided to nsg. CSW offered several times to contact caseworker from [employer] project to provide dc update. Pt declined CSW assistance reporting that he will contact caseworker himself.   _______________________________________________ [name], [place] 08/17/2017, 1:09 PM

## 2017-08-17 NOTE — Congregational Nurse Program (Signed)
Congregational Nurse Program Note  Date of Encounter: 08/12/2017  Past Medical History: Past Medical History:  Diagnosis Date  . Acute deep vein thrombosis (DVT) of right lower extremity ([place]) 02/18/2017  . Malignant neoplasm of abdomen ([place])    [name] 01/11/2017  . Microcytic anemia    [name] 01/11/2017    Encounter Details:     CNP Questionnaire - 08/17/17 1640      Patient Demographics   Is this a new or existing patient? Existing   Patient is considered a/an Not Applicable   Race African-American/Black     Patient Assistance   Location of Patient Assistance [facility] patient   Patient's financial/insurance status Medicaid   Uninsured Patient ([place] [facility]) No   Patient referred to apply for the following financial assistance Not Applicable   Food insecurities addressed Not Applicable   Transportation assistance No   Assistance securing medications No   Educational health offerings Chronic disease;Cancer     Encounter Details   Primary purpose of visit Spiritual Care/Support Visit;Education/Health Concerns   Was an Emergency Department visit averted? Not Applicable   Does patient have a medical provider? Yes   Patient referred to Follow up with established PCP   Was a mental health screening completed? (GAINS tool) No   Does patient have dental issues? No   Does patient have vision issues? No   Does your patient have an abnormal blood pressure today? No   Since previous encounter, have you referred patient for abnormal blood pressure that resulted in a new diagnosis or medication change? No   Does your patient have an abnormal blood glucose today? No   Since previous encounter, have you referred patient for abnormal blood glucose that resulted in a new diagnosis or medication change? No   Was there a life-saving intervention made? No    Hospital visit to check on [place] client . Client sitting up in bed but is not very happy today . Voiced his  Concerns about  his  Care and what had happened to him ,not satisfied with his care he states . Nurse listened and tried to get him to focus on the positives of him going through surgery and recovering but he is really not  Having a good day. Ask if he had been able to keep any clear liquids down he stated yes .Stated he had something done to his  colonostomy on yesterday .family members in to visit also . Nurse will continue to check on client and has notified [facility] program of his hospitalization. Will determine what next steps are once we clearly understand plans for his discharge and where as he may need SNF.

## 2017-08-17 NOTE — Progress Notes (Signed)
Calorie Count Note  72 hour calorie count ordered. Day 2 results below.  Diet: regular Supplements: Ensure Enlive po TID, each supplement provides 350 kcal and 20 grams of protein  8/20: Breakfast: 0% Lunch: 107 kcal, 1g protein Dinner: 138 kcal, 3g protein Supplements: 700 kcal, 40g protein  Total intake: 945 kcal (50% of minimum estimated needs)  44g protein (52% of minimum estimated needs)  Estimated Nutritional Needs:  Kcal:  1880-2050 (33-36 kcal/kg) Protein:  85-97 grams (1.5-1.7 grams/kg)  Nutrition Dx: Malnutrition (severe) related to chronic illness, catabolic illness, cancer and cancer related treatments as evidenced by severe depletion of muscle mass, severe depletion of body fat, percent weight loss.  Goal: Pt to meet >/= 90% of their estimated nutrition needs   Intervention:  -Continue Ensure supplements to TID -Encouraged PO intake  -Provided Boost Coupons  Clayton Bibles, MS, RD, LDN Pager: [phone] After Hours Pager: [phone]

## 2017-08-17 NOTE — Progress Notes (Signed)
Call made to [facility] at [facility] and gave her report on patient. Questions answered. Pt to be picked up at 1500 by [facility] per [place], SW.

## 2017-08-17 NOTE — Discharge Summary (Signed)
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 EXAM: PORTABLE ABDOMEN - 1 VIEW FINDINGS: NG tube with tip in the gastric antrum. No dilated to large or small bowel. Percutaneous nephrostomy tube on the LEFT. IMPRESSION: NG tube with tip in the gastric antrum. Electronically Signed   By: Suzy Bouchard M.D.   On: 08/03/2017 14:29   Dg Abd Portable 1v-small Bowel Obstruction Protocol-initial, 8 Hr Delay  Result Date: 07/30/2017 CLINICAL DATA:  56 y/o M; small-bowel obstruction protocol, 8 hour delay. EXAM: PORTABLE ABDOMEN - 1 VIEW COMPARISON:  None. FINDINGS: There is persistent small bowel obstruction. Contrast is retained within the stomach and can be seen within loops of small bowel in the right hemiabdomen. There are non-opacified loops of bowel in the left hemiabdomen. Partially visualized right femur intramedullary nail. Degenerative changes of the lower lumbar spine. IMPRESSION: Persistent small bowel obstruction. Contrast is retained within stomach. Contrast can be seen in small bowel loops in right hemiabdomen. There are non-opacified loops of bowel in the left hemiabdomen. Electronically Signed   By: Kristine Garbe M.D.   On: 07/30/2017 19:18    Subjective:  He relates  he feels great no new complaints. Discharge Exam: Vitals:   08/16/17 2139 08/17/17 0434  BP: 109/68 105/74  Pulse: (!) 102 99  Resp: 15 16  Temp: 98.3 F (36.8 C) 98.1 F (36.7 C)  SpO2: 100% 100%   Vitals:   08/16/17 0523 08/16/17 1427 08/16/17 2139 08/17/17 0434  BP: 110/68 101/66 109/68 105/74  Pulse: 100 (!) 102 (!) 102 99  Resp: 16 16 15 16   Temp: 98.1 F (36.7 C) 99 F (37.2 C) 98.3 F (36.8 C) 98.1 F (36.7 C)  TempSrc: Oral Oral Oral Oral  SpO2: 100% 100% 100% 100%  Weight:      Height:        General: Pt is alert, awake, not in acute distress Cardiovascular: RRR, S1/S2 +, no rubs, no gallops Respiratory: CTA bilaterally, no wheezing, no rhonchi Abdominal: Soft, NT, ND, bowel sounds + Extremities: no edema, no cyanosis    The results of significant diagnostics from this hospitalization (including imaging, microbiology, ancillary and laboratory) are listed below for reference.     Microbiology: No results found for this or any previous visit (from the past 240 hour(s)).   Labs: BNP (last 3 results) No results for input(s): BNP in the last 8760 hours. Basic Metabolic Panel:  Recent Labs Lab 08/12/17 0527 08/13/17 0526 08/14/17 0736 08/16/17 0549 08/17/17 0713  NA 134* 132* 133* 133* 136  K 4.5 4.4 4.2 3.8 4.0  CL 102  101 101 101 104  CO2 24 24 25 25 26   GLUCOSE 101* 114* 113* 113* 118*  BUN 17 13 14 10 10   CREATININE 0.65 0.60* 0.64 0.58* 0.58*  CALCIUM 9.6 9.4 9.4 9.4 9.3  MG 1.6* 1.6* 1.5* 1.3* 1.4*  PHOS 5.1* 4.2 4.7* 4.7* 4.4   Liver Function Tests:  Recent Labs Lab 08/12/17 0527 08/16/17 0549  AST 22 24  ALT 14* 12*  ALKPHOS 123 108  BILITOT 0.4 0.3  PROT 8.0 7.3  ALBUMIN 2.8* 2.6*   No results for input(s): LIPASE, AMYLASE in the last 168 hours. No results for input(s): AMMONIA in the last 168 hours. CBC:  Recent Labs Lab 08/11/17 0155 08/11/17 0835 08/12/17 0527 08/13/17 1133 08/16/17 0549  WBC 8.6 6.9 7.3 6.4 7.4   NEUTROABS  --   --  4.7  --  5.4  HGB 8.2* 8.1* 8.6* 9.1* 8.6*  HCT 24.6* 24.5* 26.5* 27.5* 25.8*  MCV 84.8 85.7 85.5 84.6 83.5  PLT 123* 125* 151 171 163   Cardiac Enzymes: No results for input(s): CKTOTAL, CKMB, CKMBINDEX, TROPONINI in the last 168 hours. BNP: Invalid input(s): POCBNP CBG: No results for input(s): GLUCAP in the last 168 hours. D-Dimer No results for input(s): DDIMER in the last 72 hours. Hgb A1c No results for input(s): HGBA1C in the last 72 hours. Lipid Profile  Recent Labs  08/16/17 0549  TRIG 145   Thyroid function studies No results for input(s): TSH, T4TOTAL, T3FREE, THYROIDAB in the last 72 hours.  Invalid input(s): FREET3 Anemia work up No results for input(s): VITAMINB12, FOLATE, FERRITIN, TIBC, IRON, RETICCTPCT in the last 72 hours. Urinalysis    Component Value Date/Time   COLORURINE YELLOW 07/10/2017 0045   APPEARANCEUR CLEAR 07/10/2017 0045   LABSPEC 1.009 07/10/2017 0045   PHURINE 5.0 07/10/2017 0045   GLUCOSEU NEGATIVE 07/10/2017 0045   HGBUR MODERATE (A) 07/10/2017 0045   BILIRUBINUR NEGATIVE 07/10/2017 0045   KETONESUR NEGATIVE 07/10/2017 0045   PROTEINUR NEGATIVE 07/10/2017 0045   NITRITE NEGATIVE 07/10/2017 0045   LEUKOCYTESUR NEGATIVE 07/10/2017 0045   Sepsis Labs Invalid input(s): PROCALCITONIN,  WBC,  LACTICIDVEN Microbiology No results found for this or any previous visit (from the past 240 hour(s)).   Time coordinating discharge: Over 30 minutes  SIGNED:   Charlynne Cousins, MD  [facility] 08/17/2017, 10:23 AM Pager   If 7PM-7AM, please contact night-coverage [email or web address] Password [email or web address]

## 2017-08-19 ENCOUNTER — Encounter (HOSPITAL_COMMUNITY): Payer: Self-pay

## 2017-08-19 ENCOUNTER — Inpatient Hospital Stay (HOSPITAL_COMMUNITY)
Admission: EM | Admit: 2017-08-19 | Discharge: 2017-08-21 | DRG: 356 | Disposition: A | Discharge status: Skilled Nursing Facility | Payer: Medicaid Other | Attending: Internal Medicine | Admitting: Internal Medicine

## 2017-08-19 DIAGNOSIS — Z681 Body mass index (BMI) 19 or less, adult: Secondary | ICD-10-CM

## 2017-08-19 DIAGNOSIS — C189 Malignant neoplasm of colon, unspecified: Secondary | ICD-10-CM | POA: Diagnosis present

## 2017-08-19 DIAGNOSIS — Z933 Colostomy status: Secondary | ICD-10-CM

## 2017-08-19 DIAGNOSIS — C186 Malignant neoplasm of descending colon: Secondary | ICD-10-CM

## 2017-08-19 DIAGNOSIS — K921 Melena: Principal | ICD-10-CM | POA: Diagnosis present

## 2017-08-19 DIAGNOSIS — E43 Unspecified severe protein-calorie malnutrition: Secondary | ICD-10-CM | POA: Diagnosis present

## 2017-08-19 DIAGNOSIS — Z86718 Personal history of other venous thrombosis and embolism: Secondary | ICD-10-CM | POA: Diagnosis not present

## 2017-08-19 DIAGNOSIS — Z7901 Long term (current) use of anticoagulants: Secondary | ICD-10-CM | POA: Diagnosis not present

## 2017-08-19 DIAGNOSIS — Z79899 Other long term (current) drug therapy: Secondary | ICD-10-CM

## 2017-08-19 DIAGNOSIS — E440 Moderate protein-calorie malnutrition: Secondary | ICD-10-CM | POA: Diagnosis not present

## 2017-08-19 DIAGNOSIS — K922 Gastrointestinal hemorrhage, unspecified: Secondary | ICD-10-CM | POA: Diagnosis not present

## 2017-08-19 DIAGNOSIS — I82409 Acute embolism and thrombosis of unspecified deep veins of unspecified lower extremity: Secondary | ICD-10-CM

## 2017-08-19 LAB — COMPREHENSIVE METABOLIC PANEL
ALBUMIN: 3.3 g/dL — AB (ref 3.5–5.0)
ALT: 12 U/L — AB (ref 17–63)
AST: 22 U/L (ref 15–41)
Alkaline Phosphatase: 108 U/L (ref 38–126)
Anion gap: 8 (ref 5–15)
BUN: 15 mg/dL (ref 6–20)
CO2: 26 mmol/L (ref 22–32)
CREATININE: 0.74 mg/dL (ref 0.61–1.24)
Calcium: 9.6 mg/dL (ref 8.9–10.3)
Chloride: 103 mmol/L (ref 101–111)
GFR calc Af Amer: >60 mL/min (ref >60)
GFR calc non Af Amer: >60 mL/min (ref >60)
GLUCOSE: 103 mg/dL — AB (ref 65–99)
POTASSIUM: 4 mmol/L (ref 3.5–5.1)
Sodium: 137 mmol/L (ref 135–145)
TOTAL PROTEIN: 8.6 g/dL — AB (ref 6.5–8.1)
Total Bilirubin: 0.4 mg/dL (ref 0.3–1.2)

## 2017-08-19 LAB — CBC
HCT: 25 % — ABNORMAL LOW (ref 39.0–52.0)
HEMATOCRIT: 25.2 % — AB (ref 39.0–52.0)
HEMOGLOBIN: 8.1 g/dL — AB (ref 13.0–17.0)
Hemoglobin: 8.4 g/dL — ABNORMAL LOW (ref 13.0–17.0)
MCH: 27.2 pg (ref 26.0–34.0)
MCH: 27.8 pg (ref 26.0–34.0)
MCHC: 32.4 g/dL (ref 30.0–36.0)
MCHC: 33.3 g/dL (ref 30.0–36.0)
MCV: 83.4 fL (ref 78.0–100.0)
MCV: 83.9 fL (ref 78.0–100.0)
Platelets: 179 10*3/uL (ref 150–400)
Platelets: 190 10*3/uL (ref 150–400)
RBC: 2.98 MIL/uL — AB (ref 4.22–5.81)
RBC: 3.02 MIL/uL — ABNORMAL LOW (ref 4.22–5.81)
RDW: 14.1 % (ref 11.5–15.5)
RDW: 14 % (ref 11.5–15.5)
WBC: 5.4 10*3/uL (ref 4.0–10.5)
WBC: 4.8 10*3/uL (ref 4.0–10.5)

## 2017-08-19 LAB — PROTIME-INR
INR: 1.77
PROTHROMBIN TIME: 20.9 s — AB (ref 11.4–15.2)

## 2017-08-19 LAB — TYPE AND SCREEN
ABO/RH(D): O POS
Antibody Screen: NEGATIVE

## 2017-08-19 LAB — HEPATIC FUNCTION PANEL
ALK PHOS: 138 — AB (ref 25–125)
ALT: 9 — AB (ref 10–40)
AST: 22 (ref 14–40)
Bilirubin, Total: 0.3

## 2017-08-19 LAB — CBC WITH DIFFERENTIAL/PLATELET
Basophils Absolute: 0 10*3/uL (ref 0.0–0.1)
Basophils Relative: 1 %
EOS ABS: 0.2 10*3/uL (ref 0.0–0.7)
Eosinophils Relative: 3 %
HEMATOCRIT: 25.3 % — AB (ref 39.0–52.0)
HEMOGLOBIN: 8.4 g/dL — AB (ref 13.0–17.0)
LYMPHS ABS: 1.5 10*3/uL (ref 0.7–4.0)
LYMPHS PCT: 26 %
MCH: 28.2 pg (ref 26.0–34.0)
MCHC: 33.2 g/dL (ref 30.0–36.0)
MCV: 84.9 fL (ref 78.0–100.0)
MONOS PCT: 7 %
Monocytes Absolute: 0.4 10*3/uL (ref 0.1–1.0)
NEUTROS ABS: 3.6 10*3/uL (ref 1.7–7.7)
Neutrophils Relative %: 63 %
Platelets: 198 10*3/uL (ref 150–400)
RBC: 2.98 MIL/uL — ABNORMAL LOW (ref 4.22–5.81)
RDW: 14.2 % (ref 11.5–15.5)
WBC: 5.6 10*3/uL (ref 4.0–10.5)

## 2017-08-19 LAB — BASIC METABOLIC PANEL
BUN: 13 (ref 4–21)
CREATININE: 0.7 (ref 0.6–1.3)
GLUCOSE: 97
Potassium: 4 (ref 3.4–5.3)
Sodium: 137 (ref 137–147)

## 2017-08-19 LAB — POC OCCULT BLOOD, ED: FECAL OCCULT BLD: POSITIVE — AB

## 2017-08-19 MED ORDER — SODIUM CHLORIDE 0.9 % IV SOLN: INTRAVENOUS | Status: DC

## 2017-08-19 MED ORDER — SODIUM CHLORIDE 0.9 % IV BOLUS (SEPSIS)
500.0000 mL | Freq: Once | INTRAVENOUS | Status: AC
Start: 2017-08-19 — End: 2017-08-19
  Administered 2017-08-19: 500 mL via INTRAVENOUS

## 2017-08-19 MED ORDER — LOPERAMIDE HCL 2 MG PO CAPS: 2.0000 mg | ORAL_CAPSULE | Freq: Four times a day (QID) | ORAL | Status: DC | PRN

## 2017-08-19 MED ORDER — SODIUM CHLORIDE 0.9 % IV SOLN
8.0000 mg | Freq: Three times a day (TID) | INTRAVENOUS | Status: DC | PRN
  Filled 2017-08-19: qty 4

## 2017-08-19 MED ORDER — OXYCODONE HCL 5 MG PO TABS
10.0000 mg | ORAL_TABLET | Freq: Three times a day (TID) | ORAL | Status: DC | PRN
  Filled 2017-08-19 (×2): qty 2

## 2017-08-19 MED ORDER — PROCHLORPERAZINE MALEATE 10 MG PO TABS: 10.0000 mg | ORAL_TABLET | Freq: Four times a day (QID) | ORAL | Status: DC | PRN

## 2017-08-19 MED ORDER — LIDOCAINE-PRILOCAINE 2.5-2.5 % EX CREA: TOPICAL_CREAM | Freq: Once | CUTANEOUS | Status: DC

## 2017-08-19 MED ORDER — HYDROCORTISONE 1 % EX CREA
TOPICAL_CREAM | Freq: Two times a day (BID) | CUTANEOUS | Status: DC
  Administered 2017-08-20: 10:00:00 via TOPICAL
  Filled 2017-08-19: qty 28

## 2017-08-19 MED ORDER — SODIUM CHLORIDE 0.9 % IV SOLN
80.0000 mg | Freq: Once | INTRAVENOUS | Status: AC
  Administered 2017-08-19: 12:00:00 80 mg via INTRAVENOUS
  Filled 2017-08-19: qty 80

## 2017-08-19 MED ORDER — MIRTAZAPINE 15 MG PO TABS
15.0000 mg | ORAL_TABLET | Freq: Every day | ORAL | Status: DC
  Filled 2017-08-19: qty 1

## 2017-08-19 MED ORDER — SODIUM CHLORIDE 0.9 % IV BOLUS (SEPSIS)
500.0000 mL | Freq: Once | INTRAVENOUS | Status: AC
  Administered 2017-08-19: 500 mL via INTRAVENOUS

## 2017-08-19 MED ORDER — ENSURE ENLIVE PO LIQD: 237.0000 mL | Freq: Three times a day (TID) | ORAL | Status: DC

## 2017-08-19 MED ORDER — ONDANSETRON HCL 4 MG PO TABS: 8.0000 mg | ORAL_TABLET | Freq: Three times a day (TID) | ORAL | Status: DC | PRN

## 2017-08-19 NOTE — ED Triage Notes (Signed)
Pt BIB family after they noticed yesterday that he has blood in his stoma. Stoma was made 2 weeks ago. Hx of colon CA. Takes Xarelto. Alert and oriented.

## 2017-08-19 NOTE — ED Notes (Addendum)
Pt's RUQ ostomy was leaking.  Incision to the left of the ostomy was contaminated by fecal leakage from bag. Old bag and dressing removed, old bandages over incision removed.  Stoma beefy, red, and draining appropriately. No swelling noted. Pt denied pain upon dressing and bag change. Informed MD Long. Cleaned incision with sterile saline per MD Long, paged wound/ostomy RN, and dressing changed on incision.

## 2017-08-19 NOTE — Progress Notes (Signed)
Patient readmitted from facility, consulted CSW
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
## 2017-08-20 ENCOUNTER — Encounter (HOSPITAL_COMMUNITY): Payer: Self-pay | Admitting: Interventional Radiology

## 2017-08-20 ENCOUNTER — Inpatient Hospital Stay (HOSPITAL_COMMUNITY): Payer: Medicaid Other

## 2017-08-20 DIAGNOSIS — E440 Moderate protein-calorie malnutrition: Secondary | ICD-10-CM

## 2017-08-20 HISTORY — PX: IR IVC FILTER PLMT / S&I /IMG GUID/MOD SED: IMG701

## 2017-08-20 LAB — CBC
HCT: 26.6 % — ABNORMAL LOW (ref 39.0–52.0)
HEMATOCRIT: 28.1 % — AB (ref 39.0–52.0)
HEMOGLOBIN: 8.8 g/dL — AB (ref 13.0–17.0)
HEMOGLOBIN: 9.3 g/dL — AB (ref 13.0–17.0)
MCH: 27.8 pg (ref 26.0–34.0)
MCH: 27.8 pg (ref 26.0–34.0)
MCHC: 33.1 g/dL (ref 30.0–36.0)
MCHC: 33.1 g/dL (ref 30.0–36.0)
MCV: 84.2 fL (ref 78.0–100.0)
MCV: 84.1 fL (ref 78.0–100.0)
Platelets: 190 10*3/uL (ref 150–400)
Platelets: 227 10*3/uL (ref 150–400)
RBC: 3.16 MIL/uL — AB (ref 4.22–5.81)
RBC: 3.34 MIL/uL — ABNORMAL LOW (ref 4.22–5.81)
RDW: 14 % (ref 11.5–15.5)
RDW: 14 % (ref 11.5–15.5)
WBC: 4.6 10*3/uL (ref 4.0–10.5)
WBC: 5.7 10*3/uL (ref 4.0–10.5)

## 2017-08-20 MED ORDER — MIDAZOLAM HCL 2 MG/2ML IJ SOLN
INTRAMUSCULAR | Status: AC | PRN
  Administered 2017-08-20 (×2): 1 mg via INTRAVENOUS

## 2017-08-20 MED ORDER — IOPAMIDOL (ISOVUE-300) INJECTION 61%
50.0000 mL | Freq: Once | INTRAVENOUS | Status: AC | PRN
  Administered 2017-08-20: 20 mL via INTRAVENOUS

## 2017-08-20 MED ORDER — IOPAMIDOL (ISOVUE-300) INJECTION 61%
100.0000 mL | Freq: Once | INTRAVENOUS | Status: AC | PRN
  Administered 2017-08-20: 30 mL via INTRAVENOUS

## 2017-08-20 MED ORDER — FENTANYL CITRATE (PF) 100 MCG/2ML IJ SOLN
INTRAMUSCULAR | Status: AC
  Filled 2017-08-20: qty 4

## 2017-08-20 MED ORDER — MIDAZOLAM HCL 2 MG/2ML IJ SOLN
INTRAMUSCULAR | Status: AC
  Filled 2017-08-20: qty 4

## 2017-08-20 MED ORDER — SODIUM CHLORIDE 0.9% FLUSH
10.0000 mL | INTRAVENOUS | Status: DC | PRN
  Administered 2017-08-20 – 2017-08-21 (×2): 10 mL
  Filled 2017-08-20 (×2): qty 40

## 2017-08-20 MED ORDER — FENTANYL CITRATE (PF) 100 MCG/2ML IJ SOLN
INTRAMUSCULAR | Status: AC | PRN
  Administered 2017-08-20 (×2): 50 ug via INTRAVENOUS

## 2017-08-20 MED ORDER — IOPAMIDOL (ISOVUE-300) INJECTION 61%
INTRAVENOUS | Status: AC
  Administered 2017-08-20: 20 mL via INTRAVENOUS
  Filled 2017-08-20: qty 50

## 2017-08-20 MED ORDER — IOPAMIDOL (ISOVUE-300) INJECTION 61%
INTRAVENOUS | Status: AC
  Administered 2017-08-20: 30 mL via INTRAVENOUS
  Filled 2017-08-20: qty 100

## 2017-08-20 MED ORDER — LIDOCAINE HCL (PF) 1 % IJ SOLN
INTRAMUSCULAR | Status: AC | PRN
  Administered 2017-08-20: 5 mL

## 2017-08-20 MED ORDER — LIDOCAINE HCL (PF) 1 % IJ SOLN
INTRAMUSCULAR | Status: AC
  Filled 2017-08-20: qty 30

## 2017-08-20 NOTE — Progress Notes (Cosign Needed)
IR consulted for IVC filter placement.  Patient was NPO this AM when consult was placed, but this afternoon diet was advanced has been drinking liquids. Dr. [name] willing to proceed without sedation if patient is agreeable.  Discussed procedure with patient and family member Magazine features editor) at bedside. Risks and benefits discussed with the patient including, but not limited to bleeding, infection, contrast induced renal failure, filter fracture or migration which can lead to emergency surgery or even death, strut penetration with damage or irritation to adjacent structures and caval thrombosis. All of the patient's questions were answered, patient is agreeable to proceed. Consent signed and in chart.  Anticipate procedure this afternoon without sedation.   Brynda Greathouse, MS RD PA-C

## 2017-08-20 NOTE — Procedures (Signed)
Colon ca, acute GI bleed, on xarelto for DVT  S/p IVC filter insertion  No comp EBL 0 Full report in pacs

## 2017-08-20 NOTE — NC FL2 (Deleted)
[place] LEVEL OF CARE SCREENING TOOL     IDENTIFICATION  Patient Name: Jeff [name] Birthdate: [birth year] Sex: male Admission Date (Current Location): 08/19/2017  [facility] and [place] Number:  Herbalist and Address:  [facility],  [place], [place]      Provider Number: [id number]  Attending Physician Name and Address:  Annita Brod, MD  Relative Name and Phone Number:       Current Level of Care: Hospital Recommended Level of Care: [place] Prior Approval Number:    Date Approved/Denied:   PASRR Number: [id number]  Discharge Plan: SNF    Current Diagnoses: Patient Active Problem List   Diagnosis Date Noted  . Lower GI bleeding 08/19/2017  . Severe malnutrition ([place]) 07/31/2017  . Colon obstruction ([place]) 07/31/2017  . Malnutrition of moderate degree 07/30/2017  . SBO (small bowel obstruction) ([place]) 07/29/2017  . Malignant neoplasm of splenic flexure ([name])   . 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 Diarrhea 07/10/2017  . Abdominal pain 07/10/2017  . Sepsis ([place]) 07/10/2017  . Hyponatremia 07/10/2017  . Depression 07/10/2017  . Chronic anticoagulation 07/10/2017  . On antineoplastic chemotherapy 07/10/2017  . 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 Intra-abdominal abscess  07/10/2017  . Hypokalemia 07/10/2017  . Hypomagnesemia 07/10/2017  . 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 Port catheter in place 03/02/2017  . Goals of care, counseling/discussion 02/20/2017  . History of deep vein thrombosis (DVT) of lower extremity 02/18/2017  . Anemia, iron deficiency 01/27/2017  . Protein-calorie malnutrition, severe 01/13/2017  . Primary colon cancer with metastasis to other site ([place]) 01/12/2017    Orientation RESPIRATION BLADDER Height & Weight     Self, Time, Situation, Place  Normal Continent Weight: 54 kg (119  lb) Height:  5\' 9"  (175.3 cm)  BEHAVIORAL SYMPTOMS/MOOD NEUROLOGICAL BOWEL NUTRITION STATUS  Other (Comment) (no behaviors)   Colostomy  (see dc summary)  AMBULATORY STATUS COMMUNICATION OF NEEDS Skin   Limited Assist Verbally Surgical wounds                       Personal Care Assistance Level of Assistance  Bathing, Feeding, Dressing Bathing Assistance: Limited assistance Feeding assistance: Independent       Functional Limitations Info  Sight, Hearing, Speech Sight Info: Adequate Hearing Info: Adequate Speech Info: Adequate    SPECIAL CARE FACTORS FREQUENCY  PT (By licensed PT), OT (By licensed OT)     PT Frequency: 5x wk OT Frequency: 5x wk            Contractures Contractures Info: Not present    Additional Factors Info  Code Status Code Status Info: Full Code             Current Medications (08/20/2017):  This is the current hospital active medication list Current Facility-Administered Medications  Medication Dose Route Frequency Provider Last Rate Last Dose  . 0.9 %  sodium chloride infusion   Intravenous Continuous Bonnell Public Tublu, MD      . feeding supplement (ENSURE ENLIVE) (ENSURE ENLIVE) liquid 237 mL  237 mL Oral TID BM Bonnell Public Tublu, MD      . hydrocortisone cream 1 %   Topical BID Bonnell Public Tublu, MD      . loperamide (IMODIUM) capsule 2 mg  2 mg Oral QID PRN Vashti Hey, MD      .  mirtazapine (REMERON) tablet 15 mg  15 mg Oral QHS Bonnell Public Tublu, MD      . ondansetron [facility]) tablet 8 mg  8 mg Oral Q8H PRN Vashti Hey, MD       Or  . ondansetron (ZOFRAN) 8 mg in sodium chloride 0.9 % 50 mL IVPB  8 mg Intravenous Q8H PRN Bonnell Public Tublu, MD      . oxyCODONE (Oxy IR/ROXICODONE) immediate release tablet 10 mg  10 mg Oral Q8H PRN Bonnell Public Tublu, MD      . prochlorperazine (COMPAZINE) tablet 10 mg  10 mg Oral Q6H PRN Vashti Hey, MD        Facility-Administered Medications Ordered in Other Encounters  Medication Dose Route Frequency Provider Last Rate Last Dose  . 0.9 %  sodium chloride infusion   Intravenous Once [name], MD      . sodium chloride flush (NS) 0.9 % injection 10 mL  10 mL Intracatheter PRN [name], MD   10 mL at 07/08/17 1356     Discharge Medications: Please see discharge summary for a list of discharge medications.  Relevant Imaging Results:  Relevant Lab Results:   Additional Information SS # [id number]. Pt has upcoming palliative oncology appts. ( 08/16/17 [facility] ).  Jonnelle Lawniczak, Randall An, LCSW

## 2017-08-20 NOTE — NC FL2 (Signed)
[place] LEVEL OF CARE SCREENING TOOL     IDENTIFICATION  Patient Name: Jeff [name] Birthdate: [birth year] Sex: male Admission Date (Current Location): 08/19/2017  [facility] and [place] Number:  Herbalist and Address:  [facility],  [place], [place]      Provider Number: [id number]  Attending Physician Name and Address:  Annita Brod, MD  Relative Name and Phone Number:       Current Level of Care: Hospital Recommended Level of Care: [place] Prior Approval Number:    Date Approved/Denied:   PASRR Number: [id number]  Discharge Plan: SNF    Current Diagnoses: Patient Active Problem List   Diagnosis Date Noted  . Lower GI bleeding 08/19/2017  . Severe malnutrition ([place]) 07/31/2017  . Colon obstruction ([place]) 07/31/2017  . Malnutrition of moderate degree 07/30/2017  . SBO (small bowel obstruction) ([place]) 07/29/2017  . Malignant neoplasm of splenic flexure ([place])   . 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 Diarrhea 07/10/2017  . Abdominal pain 07/10/2017  . Sepsis ([place]) 07/10/2017  . Hyponatremia 07/10/2017  . Depression 07/10/2017  . Chronic anticoagulation 07/10/2017  . On antineoplastic chemotherapy 07/10/2017  . 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 Intra-abdominal abscess  07/10/2017  . Hypokalemia 07/10/2017  . Hypomagnesemia 07/10/2017  . 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 Port catheter in place 03/02/2017  . Goals of care, counseling/discussion 02/20/2017  . History of deep vein thrombosis (DVT) of lower extremity 02/18/2017  . Anemia, iron deficiency 01/27/2017  . Protein-calorie malnutrition, severe 01/13/2017  . Primary colon cancer with metastasis to other site ([place]) 01/12/2017    Orientation RESPIRATION BLADDER Height & Weight     Self, Time, Situation, Place  Normal Continent Weight: 54 kg (119  lb) Height:  5\' 9"  (175.3 cm)  BEHAVIORAL SYMPTOMS/MOOD NEUROLOGICAL BOWEL NUTRITION STATUS  Other (Comment) (no behaviors)   Colostomy  (see dc summary)  AMBULATORY STATUS COMMUNICATION OF NEEDS Skin   Limited Assist Verbally Surgical wounds                       Personal Care Assistance Level of Assistance  Bathing, Feeding, Dressing Bathing Assistance: Limited assistance Feeding assistance: Independent       Functional Limitations Info  Sight, Hearing, Speech Sight Info: Adequate Hearing Info: Adequate Speech Info: Adequate    SPECIAL CARE FACTORS FREQUENCY  PT (By licensed PT), OT (By licensed OT)     PT Frequency: 5x wk OT Frequency: 5x wk            Contractures Contractures Info: Not present    Additional Factors Info  Code Status Code Status Info: Full Code             Current Medications (08/20/2017):  This is the current hospital active medication list Current Facility-Administered Medications  Medication Dose Route Frequency Provider Last Rate Last Dose  . 0.9 %  sodium chloride infusion   Intravenous Continuous Bonnell Public Tublu, MD      . feeding supplement (ENSURE ENLIVE) (ENSURE ENLIVE) liquid 237 mL  237 mL Oral TID BM Bonnell Public Tublu, MD      . hydrocortisone cream 1 %   Topical BID Bonnell Public Tublu, MD      . loperamide (IMODIUM) capsule 2 mg  2 mg Oral QID PRN Vashti Hey, MD      .  mirtazapine (REMERON) tablet 15 mg  15 mg Oral QHS Bonnell Public Tublu, MD      . ondansetron [facility]) tablet 8 mg  8 mg Oral Q8H PRN Vashti Hey, MD       Or  . ondansetron (ZOFRAN) 8 mg in sodium chloride 0.9 % 50 mL IVPB  8 mg Intravenous Q8H PRN Bonnell Public Tublu, MD      . oxyCODONE (Oxy IR/ROXICODONE) immediate release tablet 10 mg  10 mg Oral Q8H PRN Bonnell Public Tublu, MD      . prochlorperazine (COMPAZINE) tablet 10 mg  10 mg Oral Q6H PRN Bonnell Public Tublu, MD      .  sodium chloride flush (NS) 0.9 % injection 10-40 mL  10-40 mL Intracatheter PRN Annita Brod, MD       Facility-Administered Medications Ordered in Other Encounters  Medication Dose Route Frequency Provider Last Rate Last Dose  . 0.9 %  sodium chloride infusion   Intravenous Once [name], MD      . sodium chloride flush (NS) 0.9 % injection 10 mL  10 mL Intracatheter PRN [name], MD   10 mL at 07/08/17 1356     Discharge Medications: Please see discharge summary for a list of discharge medications.  Relevant Imaging Results:  Relevant Lab Results:   Additional Information SS # [id number]. Pt has upcoming palliative oncology appts. ( 08/16/17 [place] [facility] ).  Samanyu Tinnell, Randall An, LCSW

## 2017-08-20 NOTE — Progress Notes (Signed)
Initial Nutrition Assessment  DOCUMENTATION CODES:   Severe malnutrition in context of chronic illness, Underweight  INTERVENTION:  - Diet advancement as medically feasible. - Ensure Jeanne Ivan has already been ordered TID, each supplement provides 350 kcal and 20 grams of protein. - RD will monitor for additional needs at follow-up.  NUTRITION DIAGNOSIS:   Malnutrition (severe) related to chronic illness, catabolic illness, cancer and cancer related treatments as evidenced by severe depletion of muscle mass, severe depletion of body fat.  GOAL:   Patient will meet greater than or equal to 90% of their needs  MONITOR:   Diet advancement, Weight trends, Labs, I & O's  REASON FOR ASSESSMENT:   Malnutrition Screening Tool, Consult Assessment of nutrition requirement/status  ASSESSMENT:   56 year old man with metastatic colon cancer and history of DVT on Xarelto who was recently in house until 2 days ago after a prolonged admission for bowel obstruction treated with bypassing of metastases with colostomy which was done on 08/08/2017. Patient had recently been discharged to rehabilitation and was doing okay until last night when he noted that colostomy output had red blood mixed in with his stool.  Pt seen for MST and consult. BMI indicates underweight status. Pt was admitted at [facility] 8/2-8/21 and was the d/c'ed to rehab at [facility]. He presented to the ED yesterday evening d/t bright red blood in colostomy. Pt has been NPO since admission. Calorie Count had been done on pt 8/19 and 8/20 with associated RD assessment of findings on 8/20 and 8/21. On 8/19 pt consumed 0% of all meals and 0% of Ensure Enlive which was ordered BID. On 8/20 he consumed 945 kcal and 44 grams of protein from meals and Ensure. He had also been on TPN during previous admission: Clinimix E 5/20 @ 75 mL/hr with 20% ILE @ 20 mL/hr x12 hours which provided 90 grams of protein and 2064 kcal and 90 grams of protein. Rate was  reduced to 40 mL/hr on 8/20 and discontinued on 8/21 prior to d/c. Will monitor for ability of diet advancement and if needs beyond Ensure Enlive exist at time of follow-up.  Physical assessment today consistent with assessment done by this RD on 8/3: moderate and severe muscle and moderate and severe fat wasting, no edema noted. Per chart review, pt has lost 16 lbs (12% body weight) in the past 1 month which is significant for time frame.   Medications reviewed. Labs reviewed.  IVF: NS @ 100 mL/hr.    Diet Order:  Diet NPO time specified  Skin:  Reviewed, no issues (abdominal incision from 8/8 (closed))  Last BM:  8/23 via colostomy  Height:   Ht Readings from Last 1 Encounters:  08/19/17 5\' 9"  (1.753 m)    Weight:   Wt Readings from Last 1 Encounters:  08/19/17 119 lb (54 kg)    Ideal Body Weight:  72.73 kg  BMI:  Body mass index is 17.57 kg/m.  Estimated Nutritional Needs:   Kcal:  1890-2160 (35-40 kcal/kg)  Protein:  92-103 grams (1.7-1.9 grams/kg)  Fluid:  >/= 1.9 L/day  EDUCATION NEEDS:   No education needs identified at this time     Jarome Matin, MS, RD, LDN, CNSC Inpatient Clinical Dietitian Pager # [phone] After hours/weekend pager # [phone]

## 2017-08-20 NOTE — Progress Notes (Addendum)
PROGRESS NOTE    ED MANDICH  SFK:[id number] [name]: 1961-06-16 [name]: 08/19/2017 PCP: [name], MD   Addendum: Spoke with Dr. [name], gastroenterologist, and there is currently no need for any endoscopy. He agrees with plan for IVC filter placement and discontinuation of Xarelto to prevent any further recurrence of bleeding.  Brief Narrative/Subjective:   This is a 56 year old male with metastatic colon cancer status post recent colostomy due to bowel obstruction. He has a history of bilateral lower extremity DVTs currently on Xarelto for treatment. He presented to the emergency department with bloody output in his colostomy bag.  Patient seen and evaluated this morning. He denies any new complaints or concerns and states that he has not seen any further bleeding in his colostomy bag. No acute events have been noted overnight. He denies any abdominal pain. He would like something to eat if possible, but GI evaluation is currently pending.   Assessment & Plan:   Active Problems:   Primary colon cancer with metastasis to other site [facility])  Patient is status post colostomy Not currently on active chemotherapy based on significant weight loss Oncology notified    History of deep vein thrombosis (DVT) of lower extremity  Patient is on Xarelto for bilateral DVT; currently discontinued Will consult with IR for placement of IVC filter today    Chronic anticoagulation   Lower GI bleeding  GI evaluation pending Currently on Protonix drip Anticoagulation withheld Nothing by mouth  Moderate Protein-Calorie Malnutrition  We will consult nutrition    DVT prophylaxis: None, recently on Xarelto Code Status: Full Family Communication: No family present Disposition Plan: Patient requires GI evaluation and IVC filter placement with IR. Anticipated discharge back to rehabilitation facility when ready.  Consultants:   [name], gastroenterology  Procedures:     None  Antimicrobials:  None    Objective: Vitals:   08/19/17 1542 08/19/17 1959 08/20/17 0000 08/20/17 0556  BP: 111/86 98/76 100/85 99/73  Pulse: 89 93 99 97  Resp: 16 16 17 18   Temp: 98.8 F (37.1 C) 98.7 F (37.1 C) 98.3 F (36.8 C) 98.5 F (36.9 C)  TempSrc:  Oral Oral Oral  SpO2: 100% 99% 100% 100%  Weight:      Height:        Intake/Output Summary (Last 24 hours) at 08/20/17 1000 Last data filed at 08/20/17 0557  Gross per 24 hour  Intake               70 ml  Output              975 ml  Net             -905 ml   Filed Weights   08/19/17 0936  Weight: 54 kg (119 lb)    Examination:  General exam: Appears calm and comfortable  Respiratory system: Clear to auscultation. Respiratory effort normal. Cardiovascular system: S1 & S2 heard, RRR. No JVD, murmurs, rubs, gallops or clicks. No pedal edema. Gastrointestinal system: Abdomen is nondistended, soft and nontender. No organomegaly or masses felt. Normal bowel sounds heard.Colostomy site examined with no significant discharge or erythema surrounding. There is fluid in the bag that is blood-tinged, but no stools are noted. Central nervous system: Alert and oriented. No focal neurological deficits. Extremities: Symmetric 5 x 5 power. Skin: No rashes, lesions or ulcers Psychiatry: Judgement and insight appear normal. Mood & affect appropriate.    Data Reviewed: I have personally reviewed following labs and imaging studies  CBC:  Recent Labs Lab 08/16/17 0549 08/19/17 1047 08/19/17 1828 08/19/17 2340 08/20/17 0534  WBC 7.4 5.6 4.8 5.4 4.6  NEUTROABS 5.4 3.6  --   --   --   HGB 8.6* 8.4* 8.1* 8.4* 8.8*  HCT 25.8* 25.3* 25.0* 25.2* 26.6*  MCV 83.5 84.9 83.9 83.4 84.2  PLT 163 198 190 179 884   Basic Metabolic Panel:  Recent Labs Lab 08/14/17 0736 08/16/17 0549 08/17/17 0713 08/19/17 1046  NA 133* 133* 136 137  K 4.2 3.8 4.0 4.0  CL 101 101 104 103  CO2 25 25 26 26   GLUCOSE 113* 113* 118*  103*  BUN 14 10 10 15   CREATININE 0.64 0.58* 0.58* 0.74  CALCIUM 9.4 9.4 9.3 9.6  MG 1.5* 1.3* 1.4*  --   PHOS 4.7* 4.7* 4.4  --    GFR: Estimated Creatinine Clearance: 76.9 mL/min (by C-G formula based on SCr of 0.74 mg/dL). Liver Function Tests:  Recent Labs Lab 08/16/17 0549 08/19/17 1046  AST 24 22  ALT 12* 12*  ALKPHOS 108 108  BILITOT 0.3 0.4  PROT 7.3 8.6*  ALBUMIN 2.6* 3.3*   No results for input(s): LIPASE, AMYLASE in the last 168 hours. No results for input(s): AMMONIA in the last 168 hours. Coagulation Profile:  Recent Labs Lab 08/19/17 1047  INR 1.77   Cardiac Enzymes: No results for input(s): CKTOTAL, CKMB, CKMBINDEX, TROPONINI in the last 168 hours. BNP (last 3 results) No results for input(s): PROBNP in the last 8760 hours. HbA1C: No results for input(s): HGBA1C in the last 72 hours. CBG: No results for input(s): GLUCAP in the last 168 hours. Lipid Profile: No results for input(s): CHOL, HDL, LDLCALC, TRIG, CHOLHDL, LDLDIRECT in the last 72 hours. Thyroid Function Tests: No results for input(s): TSH, T4TOTAL, FREET4, T3FREE, THYROIDAB in the last 72 hours. Anemia Panel: No results for input(s): VITAMINB12, FOLATE, FERRITIN, TIBC, IRON, RETICCTPCT in the last 72 hours. Sepsis Labs: No results for input(s): PROCALCITON, LATICACIDVEN in the last 168 hours.  No results found for this or any previous visit (from the past 240 hour(s)).       Radiology Studies: No results found.      Scheduled Meds: . feeding supplement (ENSURE ENLIVE)  237 mL Oral TID BM  . hydrocortisone cream   Topical BID  . mirtazapine  15 mg Oral QHS   Continuous Infusions: . sodium chloride    . ondansetron (ZOFRAN) IV       LOS: 1 day     Rodena Goldmann, MD [facility] Pager [phone]  If 7PM-7AM, please contact night-coverage [email or web address] Password [email or web address] 08/20/2017, 10:00 AM

## 2017-08-20 NOTE — Progress Notes (Signed)
CSW consulted to assist with dc planning. Pt was recently hospitalized at [facility] from 8/2'18-8/21/18 and was dc to [facility] on the 21st. Pt hospitalized from SNF with GI bleed. CSW met with pt at bedside to assist with dc planning. Pt reports that he wasn't pleased with care he received at [facility]. " The people there don't know how to take care of this bag. " CSW will initiate new SNF search and provide bed offers once received.  Werner Lean LCSW [phone]

## 2017-08-21 DIAGNOSIS — C186 Malignant neoplasm of descending colon: Secondary | ICD-10-CM

## 2017-08-21 LAB — BASIC METABOLIC PANEL
Anion gap: 8 (ref 5–15)
BUN: 16 mg/dL (ref 6–20)
CO2: 25 mmol/L (ref 22–32)
Calcium: 9.2 mg/dL (ref 8.9–10.3)
Chloride: 102 mmol/L (ref 101–111)
Creatinine, Ser: 0.8 mg/dL (ref 0.61–1.24)
GFR calc Af Amer: >60 mL/min (ref >60)
GFR calc non Af Amer: >60 mL/min (ref >60)
Glucose, Bld: 110 mg/dL — ABNORMAL HIGH (ref 65–99)
Potassium: 3.6 mmol/L (ref 3.5–5.1)
Sodium: 135 mmol/L (ref 135–145)

## 2017-08-21 MED ORDER — HEPARIN SOD (PORK) LOCK FLUSH 100 UNIT/ML IV SOLN
500.0000 [IU] | INTRAVENOUS | Status: AC | PRN
  Administered 2017-08-21: 500 [IU]

## 2017-08-21 MED ORDER — OXYCODONE HCL 10 MG PO TABS: 10.0000 mg | ORAL_TABLET | Freq: Four times a day (QID) | ORAL | 0 refills | Status: AC | PRN

## 2017-08-21 MED ORDER — DICLOFENAC SODIUM 1 % TD GEL
2.0000 g | Freq: Two times a day (BID) | TRANSDERMAL | Status: DC
  Administered 2017-08-21: 2 g via TOPICAL
  Filled 2017-08-21: qty 100

## 2017-08-21 NOTE — Clinical Social Work Note (Addendum)
Jeff [name] will discharge back to [facility] today, transported by daughter, Jeff [name]. CSW talked to Jeff [name] [phone]) earlier today and she is interested in [facility], but was not sure when she could visit facility today, as she will be doing things today to prepare her daughter for school. Call made to admissions director [name] with [facility] and patient can return and transfer to [facility] if this is what daughter wants. Per [name], the earliest this could occur would be Monday. This information provided to Jeff [name] and she is fine with this, especially since she needs to visit [facility] first. Daughter contacted again at 6:27 pm and informed that she can come to get her dad as he is ready for d/c. [place] signing off as patient discharging back to [facility] and no other intervention services needed.  [name], MSW, LCSW Licensed Clinical Social Worker [place] [phone]

## 2017-08-21 NOTE — Care Management Note (Signed)
Case Management Note  Patient Details  Name: JAMEER STORIE MRN: [id number] Date of Birth: 1961/01/10  Subjective/Objective:   Pt to be discharged to SNF today                 Action/Plan:CM will sign off for now but will be available should additional discharge needs arise or disposition change.    Expected Discharge Date:  08/21/17               Expected Discharge Plan:     In-House Referral:     Discharge planning Services     Post Acute Care Choice:    Choice offered to:     DME Arranged:    DME Agency:     HH Arranged:    HH Agency:     Status of Service:     If discussed at [employer], dates discussed:    Additional Comments:  Delrae Sawyers, RN 08/21/2017, 12:33 PM

## 2017-08-21 NOTE — Discharge Summary (Signed)
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 atrium. Lungs clear. IMPRESSION: High-grade partial small bowel obstruction pattern without free air. Electronically Signed   By: Nelson Chimes M.D.   On: 07/29/2017 13:07   Dg Abd Portable 1v  Result Date: 08/03/2017 CLINICAL DATA:  NG tube placement. EXAM: PORTABLE ABDOMEN - 1 VIEW FINDINGS: NG tube with tip in the gastric antrum. No dilated to large or small bowel. Percutaneous nephrostomy tube on the LEFT. IMPRESSION: NG tube with tip in the gastric antrum. Electronically Signed   By: Suzy Bouchard M.D.   On: 08/03/2017 14:29   Dg Abd Portable 1v-small Bowel Obstruction Protocol-initial, 8 Hr Delay  Result Date: 07/30/2017 CLINICAL DATA:  56 y/o M; small-bowel obstruction protocol, 8 hour delay. EXAM: PORTABLE ABDOMEN - 1 VIEW COMPARISON:  None. FINDINGS: There is persistent small bowel obstruction. Contrast is retained within the stomach and can be seen within loops of small bowel in the right hemiabdomen. There are non-opacified loops of bowel in the left hemiabdomen. Partially visualized right femur intramedullary nail. Degenerative changes of the lower lumbar spine. IMPRESSION: Persistent small bowel obstruction. Contrast is retained within stomach. Contrast can be seen in small bowel loops in right  hemiabdomen. There are non-opacified loops of bowel in the left hemiabdomen. Electronically Signed   By: Kristine Garbe M.D.   On: 07/30/2017 19:18      Subjective: - no chest pain, shortness of breath, no abdominal pain, nausea or vomiting.   Discharge Exam: Vitals:   08/20/17 2100 08/21/17 0628  BP: 96/76 96/72  Pulse: 98 88  Resp: 18 18  Temp: 98.3 F (36.8 C) 98.6 F (37 C)  SpO2: 100% 100%   Vitals:   08/20/17 1640 08/20/17 1645 08/20/17 2100 08/21/17 0628  BP: 99/74 103/76 96/76 96/72   Pulse: 98 95 98 88  Resp: 13 20 18 18   Temp:   98.3 F (36.8 C) 98.6 F (37 C)  TempSrc:   Oral Oral  SpO2: 100% 100% 100% 100%  Weight:      Height:        General: Pt is alert, awake, not in acute distress Cardiovascular: RRR, S1/S2 +, no rubs, no gallops Respiratory: CTA bilaterally, no wheezing, no rhonchi Abdominal: Soft, NT, ND, bowel sounds + Extremities: no edema, no cyanosis    The results of significant diagnostics from this hospitalization (including imaging, microbiology, ancillary and laboratory) are listed below for reference.     Microbiology: No results found for this or any previous visit (from the  past 240 hour(s)).   Labs: BNP (last 3 results) No results for input(s): BNP in the last 8760 hours. Basic Metabolic Panel:  Recent Labs Lab 08/16/17 0549 08/17/17 0713 08/19/17 1046 08/21/17 0319  NA 133* 136 137 135  K 3.8 4.0 4.0 3.6  CL 101 104 103 102  CO2 25 26 26 25   GLUCOSE 113* 118* 103* 110*  BUN 10 10 15 16   CREATININE 0.58* 0.58* 0.74 0.80  CALCIUM 9.4 9.3 9.6 9.2  MG 1.3* 1.4*  --   --   PHOS 4.7* 4.4  --   --    Liver Function Tests:  Recent Labs Lab 08/16/17 0549 08/19/17 1046  AST 24 22  ALT 12* 12*  ALKPHOS 108 108  BILITOT 0.3 0.4  PROT 7.3 8.6*  ALBUMIN 2.6* 3.3*   No results for input(s): LIPASE, AMYLASE in the last 168 hours. No results for input(s): AMMONIA in the last 168 hours. CBC:  Recent Labs Lab  08/16/17 0549 08/19/17 1047 08/19/17 1828 08/19/17 2340 08/20/17 0534 08/20/17 1400  WBC 7.4 5.6 4.8 5.4 4.6 5.7  NEUTROABS 5.4 3.6  --   --   --   --   HGB 8.6* 8.4* 8.1* 8.4* 8.8* 9.3*  HCT 25.8* 25.3* 25.0* 25.2* 26.6* 28.1*  MCV 83.5 84.9 83.9 83.4 84.2 84.1  PLT 163 198 190 179 190 227   Cardiac Enzymes: No results for input(s): CKTOTAL, CKMB, CKMBINDEX, TROPONINI in the last 168 hours. BNP: Invalid input(s): POCBNP CBG: No results for input(s): GLUCAP in the last 168 hours. D-Dimer No results for input(s): DDIMER in the last 72 hours. Hgb A1c No results for input(s): HGBA1C in the last 72 hours. Lipid Profile No results for input(s): CHOL, HDL, LDLCALC, TRIG, CHOLHDL, LDLDIRECT in the last 72 hours. Thyroid function studies No results for input(s): TSH, T4TOTAL, T3FREE, THYROIDAB in the last 72 hours.  Invalid input(s): FREET3 Anemia work up No results for input(s): VITAMINB12, FOLATE, FERRITIN, TIBC, IRON, RETICCTPCT in the last 72 hours. Urinalysis    Component Value Date/Time   COLORURINE YELLOW 07/10/2017 0045   APPEARANCEUR CLEAR 07/10/2017 0045   LABSPEC 1.009 07/10/2017 0045   PHURINE 5.0 07/10/2017 0045   GLUCOSEU NEGATIVE 07/10/2017 0045   HGBUR MODERATE (A) 07/10/2017 0045   BILIRUBINUR NEGATIVE 07/10/2017 0045   KETONESUR NEGATIVE 07/10/2017 0045   PROTEINUR NEGATIVE 07/10/2017 0045   NITRITE NEGATIVE 07/10/2017 0045   LEUKOCYTESUR NEGATIVE 07/10/2017 0045   Sepsis Labs Invalid input(s): PROCALCITONIN,  WBC,  LACTICIDVEN Microbiology No results found for this or any previous visit (from the past 240 hour(s)).   Time coordinating discharge: 40 minutes  SIGNED:  Marzetta Board, MD  [facility] 08/21/2017, 12:08 PM Pager [phone]  If 7PM-7AM, please contact night-coverage [email or web address] Password [email or web address]

## 2017-08-22 ENCOUNTER — Encounter: Payer: Self-pay | Admitting: Adult Health

## 2017-08-22 ENCOUNTER — Non-Acute Institutional Stay (SKILLED_NURSING_FACILITY): Payer: Medicaid Other | Admitting: Adult Health

## 2017-08-22 DIAGNOSIS — Z933 Colostomy status: Secondary | ICD-10-CM | POA: Diagnosis not present

## 2017-08-22 DIAGNOSIS — Z95828 Presence of other vascular implants and grafts: Secondary | ICD-10-CM | POA: Diagnosis not present

## 2017-08-22 DIAGNOSIS — C189 Malignant neoplasm of colon, unspecified: Secondary | ICD-10-CM

## 2017-08-22 DIAGNOSIS — C7889 Secondary malignant neoplasm of other digestive organs: Secondary | ICD-10-CM | POA: Diagnosis not present

## 2017-08-22 DIAGNOSIS — D630 Anemia in neoplastic disease: Secondary | ICD-10-CM | POA: Diagnosis not present

## 2017-08-22 DIAGNOSIS — C185 Malignant neoplasm of splenic flexure: Secondary | ICD-10-CM

## 2017-08-22 DIAGNOSIS — E43 Unspecified severe protein-calorie malnutrition: Secondary | ICD-10-CM | POA: Diagnosis not present

## 2017-08-22 DIAGNOSIS — K56609 Unspecified intestinal obstruction, unspecified as to partial versus complete obstruction: Secondary | ICD-10-CM

## 2017-08-22 DIAGNOSIS — I825Z2 Chronic embolism and thrombosis of unspecified deep veins of left distal lower extremity: Secondary | ICD-10-CM | POA: Diagnosis not present

## 2017-08-22 NOTE — Progress Notes (Signed)
Jeff [name] to be D/C'd Skilled nursing facility per MD order.  Discussed with the patient and all questions fully answered.  VSS, Skin clean, dry and intact no evidence of skin tears noted. Port site de-accessed by IV team. Site without signs and symptoms of complications.  An After Visit Summary was printed and given to the patient. Patient received prescription.  D/c education completed with patient/family including follow up instructions, medication list, d/c activities limitations if indicated, with other d/c instructions as indicated by MD - patient able to verbalize understanding, all questions fully answered.   Patient instructed to return to ED, call 911, or call MD for any changes in condition.   Patient escorted via WC, and D/C to [facility] via private auto. RN called report to facility.  [name] 08/21/17 8:00pm
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
## 2017-08-23 ENCOUNTER — Non-Acute Institutional Stay (SKILLED_NURSING_FACILITY): Payer: Medicaid Other | Admitting: Internal Medicine

## 2017-08-23 ENCOUNTER — Encounter: Payer: Self-pay | Admitting: Adult Health

## 2017-08-23 DIAGNOSIS — D630 Anemia in neoplastic disease: Secondary | ICD-10-CM

## 2017-08-23 DIAGNOSIS — I82501 Chronic embolism and thrombosis of unspecified deep veins of right lower extremity: Secondary | ICD-10-CM

## 2017-08-23 DIAGNOSIS — Z933 Colostomy status: Secondary | ICD-10-CM | POA: Diagnosis not present

## 2017-08-23 DIAGNOSIS — E43 Unspecified severe protein-calorie malnutrition: Secondary | ICD-10-CM | POA: Diagnosis not present

## 2017-08-23 DIAGNOSIS — Z95828 Presence of other vascular implants and grafts: Secondary | ICD-10-CM

## 2017-08-23 DIAGNOSIS — E860 Dehydration: Secondary | ICD-10-CM | POA: Diagnosis not present

## 2017-08-23 DIAGNOSIS — C189 Malignant neoplasm of colon, unspecified: Secondary | ICD-10-CM

## 2017-08-23 DIAGNOSIS — I959 Hypotension, unspecified: Secondary | ICD-10-CM
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
## 2017-08-24 ENCOUNTER — Encounter: Payer: Self-pay | Admitting: Adult Health

## 2017-08-24 ENCOUNTER — Non-Acute Institutional Stay (SKILLED_NURSING_FACILITY): Payer: Medicaid Other | Admitting: Adult Health

## 2017-08-24 DIAGNOSIS — C189 Malignant neoplasm of colon, unspecified: Secondary | ICD-10-CM | POA: Diagnosis not present

## 2017-08-24 DIAGNOSIS — C7889 Secondary malignant neoplasm of other digestive organs: Secondary | ICD-10-CM | POA: Diagnosis not present
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
## 2017-08-26 ENCOUNTER — Telehealth: Payer: Self-pay | Admitting: Hematology

## 2017-08-26 ENCOUNTER — Telehealth: Payer: Self-pay | Admitting: *Deleted

## 2017-08-26 NOTE — Telephone Encounter (Signed)
I will schedule him to see me next week. Thanks   [name] MD

## 2017-08-26 NOTE — Telephone Encounter (Signed)
sw pt to confirm 9/4 appt at 1230 per sch msg

## 2017-08-26 NOTE — Telephone Encounter (Signed)
Pt wanted to know when his next appt is scheduled.  He has been living at [facility] on [address] since his recent hospitalization and will be there indefinitely.   Pt phone # [phone]

## 2017-08-31 ENCOUNTER — Encounter: Payer: Self-pay | Admitting: Hematology

## 2017-08-31 ENCOUNTER — Other Ambulatory Visit (HOSPITAL_BASED_OUTPATIENT_CLINIC_OR_DEPARTMENT_OTHER): Payer: Medicaid Other

## 2017-08-31 ENCOUNTER — Ambulatory Visit (HOSPITAL_BASED_OUTPATIENT_CLINIC_OR_DEPARTMENT_OTHER): Payer: Medicaid Other | Admitting: Hematology

## 2017-08-31 ENCOUNTER — Telehealth: Payer: Self-pay

## 2017-08-31 VITALS — BP 109/75 | HR 95 | Temp 99.0°F | Resp 18 | Ht 69.0 in | Wt 123.2 lb

## 2017-08-31 DIAGNOSIS — C186 Malignant neoplasm of descending colon: Secondary | ICD-10-CM

## 2017-08-31 DIAGNOSIS — D509 Iron deficiency anemia, unspecified: Secondary | ICD-10-CM

## 2017-08-31 DIAGNOSIS — D630 Anemia in neoplastic disease: Secondary | ICD-10-CM

## 2017-08-31 DIAGNOSIS — C762 Malignant neoplasm of abdomen: Secondary | ICD-10-CM

## 2017-08-31 DIAGNOSIS — Z86718 Personal history of other venous thrombosis and embolism: Secondary | ICD-10-CM

## 2017-08-31 DIAGNOSIS — C189 Malignant neoplasm of colon, unspecified: Secondary | ICD-10-CM

## 2017-08-31 DIAGNOSIS — C786 Secondary malignant neoplasm of retroperitoneum and peritoneum: Secondary | ICD-10-CM | POA: Diagnosis not present

## 2017-08-31 LAB — CBC WITH DIFFERENTIAL/PLATELET
BASO%: 1 % (ref 0.0–2.0)
BASOS ABS: 0.1 10*3/uL (ref 0.0–0.1)
EOS ABS: 0.2 10*3/uL (ref 0.0–0.5)
EOS%: 3 % (ref 0.0–7.0)
HCT: 26.9 % — ABNORMAL LOW (ref 38.4–49.9)
HEMOGLOBIN: 8.6 g/dL — AB (ref 13.0–17.1)
LYMPH%: 25.1 % (ref 14.0–49.0)
MCH: 27.4 pg (ref 27.2–33.4)
MCHC: 31.9 g/dL — AB (ref 32.0–36.0)
MCV: 85.9 fL (ref 79.3–98.0)
MONO#: 0.6 10*3/uL (ref 0.1–0.9)
MONO%: 9 % (ref 0.0–14.0)
NEUT#: 4.1 10*3/uL (ref 1.5–6.5)
NEUT%: 61.9 % (ref 39.0–75.0)
PLATELETS: 140 10*3/uL (ref 140–400)
RBC: 3.13 10*6/uL — ABNORMAL LOW (ref 4.20–5.82)
RDW: 14.1 % (ref 11.0–14.6)
WBC: 6.6 10*3/uL (ref 4.0–10.3)
lymph#: 1.6 10*3/uL (ref 0.9–3.3)

## 2017-08-31 LAB — COMPREHENSIVE METABOLIC PANEL
ALT: <6 U/L (ref 0–55)
AST: 16 U/L (ref 5–34)
Albumin: 2.9 g/dL — ABNORMAL LOW (ref 3.5–5.0)
Alkaline Phosphatase: 96 U/L (ref 40–150)
Anion Gap: 6 mEq/L (ref 3–11)
BUN: 8.5 mg/dL (ref 7.0–26.0)
CHLORIDE: 105 meq/L (ref 98–109)
CO2: 26 meq/L (ref 22–29)
Calcium: 9.2 mg/dL (ref 8.4–10.4)
Creatinine: 0.8 mg/dL (ref 0.7–1.3)
Glucose: 80 mg/dl (ref 70–140)
POTASSIUM: 3.7 meq/L (ref 3.5–5.1)
Sodium: 137 mEq/L (ref 136–145)
Total Bilirubin: <0.22 mg/dL (ref 0.20–1.20)
Total Protein: 7.9 g/dL (ref 6.4–8.3)

## 2017-08-31 LAB — CEA (IN HOUSE-CHCC): CEA (CHCC-IN HOUSE): 5.83 ng/mL — AB (ref 0.00–5.00)

## 2017-08-31 LAB — MAGNESIUM: MAGNESIUM: 1.8 mg/dL (ref 1.5–2.5)

## 2017-08-31 LAB — FERRITIN: FERRITIN: 222 ng/mL (ref 22–316)

## 2017-08-31 NOTE — Telephone Encounter (Signed)
[place] the treatment RN at [facility] re[facility] saw green drainage on bandage of midline incision when she changed it this morning. The incision itself does look good, but this is the first time she saw drainage on dressing. It is close to his colostomy site, about an inch away. The pt told RN that he did have a little leakage/ drainage from his colostomy this morning. There is a new bag on his colostomy now.  The pt has labs and sees Dr Burr Medico today at 1 pm.

## 2017-08-31 NOTE — Progress Notes (Signed)
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 therapy is palliative. - we again discussed the incurable nature of his cancer, an overall poor prognosis. The goal of therapy is palliative, which we discussed again today.    2. History of right LE DVT -I previously reviewed his ultrasound results from 02/17/17, which showed DVT involving the femoral, popliteal, and peritoneal vein of the right lower extremity from 2/22. -He had history of right lower extremity DVT after surgery before  - his  Xarelto has been held due to GI bleeding he has a IVC. Placed  3. 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 -He has received IV Feraheme twice, but his anemia has not improved. He likely has component of anemia of chronic disease secondary to his underlying malignancy.  5. Weight loss, malnutrition, anorexia -Secondary to chemotherapy and cancer -The patient will continue to drink dietary supplements. -He has gained some weight  lately - I again strongly encouraged him to try to eat more to avoid losing excess weight. I again recommended he drink 2 ensure or boost supplements a day to help. - I prescribed mirtazapine on 05/25/17 in attempts to help his appetite. He knows to take it once every evening. - continue follow-up withdietitian  6. Pancreatic Abscess -Presents to hospital on 07/09/17 for abdominal pain. Based on SP CT from 07/10/17 he found to have an abscess. Draining tube placed and he received prolonged course of antibiotics  - His percutaneous drainage tube has been removed  -this is likely resolved now   7. Bowel obstruction due to his colon mass at the splenic flexure and surrounding inflammation due to recent abscess, s/u diverting colostomy  -He was hospitalized on 07/29/17 for SBO  -He had sigmoidoscopy and loop Colostomy and has a J tube placed.  - he is tolerating diet well now.  8. Goal of care discussion  -We again discussed the incurable nature of his cancer, and the overall poor prognosis, especially if he does not have good response to chemotherapy or progress on chemo -The patient understands the goal of care is palliative. -I have recommended DNR/DNI, he will think about it.   Plan: - restart panitumumab next week -Lab, flush, f/u and chemo with second line irinotecan and  panitumumab 2 weeks after next week treatment, will reduce irinotecan dose by 15% for first cycle. May consider adding 5-fu in future if he tolerates well   All questions were answered. The patient knows to call the clinic with any problems, questions or concerns.  I spent 30 minutes counseling the patient face to face. The total time spent in the appointment was  40 minutes and more than 50% was on counseling.   [name], MD 08/31/2017   This document serves as a record of services personally performed by [name], MD. It was created on her behalf by Joslyn Devon, a trained medical scribe. The creation of this record  is based on the scribe's personal observations and the provider's statements to them. This document has been checked and approved by the attending provider.

## 2017-09-01 ENCOUNTER — Encounter: Payer: Self-pay | Admitting: Hematology

## 2017-09-02 ENCOUNTER — Encounter: Payer: Self-pay | Admitting: Hematology

## 2017-09-02 NOTE — Progress Notes (Signed)
DISCONTINUE ON PATHWAY REGIMEN - Colorectal     A cycle is every 14 days:     Oxaliplatin        Dose Mod: None     Leucovorin        Dose Mod: None     5-Fluorouracil        Dose Mod: None     5-Fluorouracil        Dose Mod: None     Bevacizumab        Dose Mod: None  **Always confirm dose/schedule in your pharmacy ordering system**    REASON: Disease Progression PRIOR TREATMENT: [id number]: [id number] + Bevacizumab TREATMENT RESPONSE: Partial Response (PR)  START ON PATHWAY REGIMEN - Colorectal     A cycle is every 14 days:     Irinotecan      Leucovorin      5-Fluorouracil      5-Fluorouracil   **Always confirm dose/schedule in your pharmacy ordering system**    Patient Characteristics: Metastatic Colorectal, Second Line, KRAS/NRAS Wild-Type, BRAF Wild-Type/Unknown, Prior Anti-EGFR Therapy, Bevacizumab Ineligible Current evidence of distant metastases<= Yes AJCC T Category: [place] AJCC N Category: NX AJCC M Category: Staged < 8th Ed. AJCC 8 Stage Grouping: IVC BRAF Mutation Status: Wild Type (no mutation) KRAS/NRAS Mutation Status: Wild Type (no mutation) Line of therapy: Second Line Would you be surprised if this patient died  in the next year<= I would NOT be surprised if this patient died in the next year Intent of Therapy: Non-Curative / Palliative Intent, Discussed with Patient

## 2017-09-04 NOTE — Progress Notes (Signed)
This encounter was created in error - please disregard.

## 2017-09-05 DIAGNOSIS — Z933 Colostomy status: Secondary | ICD-10-CM | POA: Insufficient documentation

## 2017-09-05 DIAGNOSIS — Z95828 Presence of other vascular implants and grafts: Secondary | ICD-10-CM | POA: Insufficient documentation

## 2017-09-05 NOTE — Progress Notes (Signed)
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  . Creatinine 06/07/2017 0.8  0.7 - 1.3 mg/dL Final  . Total Bilirubin 06/07/2017 0.45  0.20 - 1.20 mg/dL Final  . Alkaline Phosphatase 06/07/2017 66  40 - 150 U/L Final  . AST 06/07/2017 34  5 - 34 U/L Final  . ALT 06/07/2017 10  0 - 55 U/L Final  . Total Protein 06/07/2017 8.4* 6.4 - 8.3 g/dL Final  . Albumin 06/07/2017 3.4* 3.5 - 5.0 g/dL Final  . Calcium 06/07/2017 9.4  8.4 - 10.4 mg/dL Final  . Anion Gap 06/07/2017 12* 3 - 11 mEq/L Final  . EGFR 06/07/2017 >90  >90 ml/min/1.73 m2 Final   eGFR is calculated using the CKD-EPI Creatinine Equation (2009)  . Magnesium 06/07/2017 1.6  1.5 - 2.5 mg/dl Final  Appointment on 05/31/2017  Component Date Value Ref Range Status  . WBC 05/31/2017 3.5* 4.0 - 10.3 10e3/uL Final  . NEUT# 05/31/2017 1.2* 1.5 - 6.5 10e3/uL Final  . HGB 05/31/2017 12.2* 13.0 - 17.1 g/dL Final  . HCT 05/31/2017 37.7* 38.4 - 49.9 % Final  . Platelets 05/31/2017 82* 140 - 400 10e3/uL Final  . MCV 05/31/2017 89.8  79.3 - 98.0 fL Final  . MCH  05/31/2017 29.0  27.2 - 33.4 pg Final  . MCHC 05/31/2017 32.4  32.0 - 36.0 g/dL Final  . RBC 05/31/2017 4.20  4.20 - 5.82 10e6/uL Final  . RDW 05/31/2017 17.0* 11.0 - 14.6 % Final  . lymph# 05/31/2017 1.8  0.9 - 3.3 10e3/uL Final  . MONO# 05/31/2017 0.5  0.1 - 0.9 10e3/uL Final  . Eosinophils Absolute 05/31/2017 0.0  0.0 - 0.5 10e3/uL Final  . Basophils Absolute 05/31/2017 0.0  0.0 - 0.1 10e3/uL Final  . NEUT% 05/31/2017 33.0* 39.0 - 75.0 % Final  . LYMPH% 05/31/2017 50.9* 14.0 - 49.0 % Final  . MONO% 05/31/2017 13.9  0.0 - 14.0 % Final  . EOS% 05/31/2017 1.1  0.0 - 7.0 % Final  . BASO% 05/31/2017 1.1  0.0 - 2.0 % Final  . nRBC 05/31/2017 0  0 - 0 % Final  . Sodium 05/31/2017 140  136 - 145 mEq/L Final  . Potassium 05/31/2017 3.5  3.5 - 5.1 mEq/L Final  . Chloride 05/31/2017 104  98 - 109 mEq/L Final  . CO2 05/31/2017 23  22 - 29 mEq/L Final  . Glucose 05/31/2017 83  70 - 140 mg/dl Final   Glucose reference range is for nonfasting patients. Fasting glucose reference range is 70- 100.  Marland Kitchen BUN 05/31/2017 9.3  7.0 - 26.0 mg/dL Final  . Creatinine 05/31/2017 1.0  0.7 - 1.3 mg/dL Final  . Total Bilirubin 05/31/2017 0.32  0.20 - 1.20 mg/dL Final  . Alkaline Phosphatase 05/31/2017 79  40 - 150 U/L Final  . AST 05/31/2017 35* 5 - 34 U/L Final  . ALT 05/31/2017 13  0 - 55 U/L Final  . Total Protein 05/31/2017 8.6* 6.4 - 8.3 g/dL Final  . Albumin 05/31/2017 3.4* 3.5 - 5.0 g/dL Final  . Calcium 05/31/2017 9.3  8.4 - 10.4 mg/dL Final  . Anion Gap 05/31/2017 14* 3 - 11 mEq/L Final  . EGFR 05/31/2017 >90  >90 ml/min/1.73 m2 Final   eGFR is calculated using the CKD-EPI Creatinine Equation (2009)  There may be more visits with results that are not included.    Ct Abdomen Pelvis W Contrast  Addendum Date: 08/15/2017   ADDENDUM REPORT: 08/15/2017 17:13 ADDENDUM: Findings discussed with Dr. Olevia Bowens. Electronically Signed   By: Dorise Bullion III M.D   On: 08/15/2017 17:13   Result Date:  08/15/2017 CLINICAL DATA:  Adenocarcinoma of the colon with metastases. Increasing abdominal and left back pain. EXAM: CT ABDOMEN AND PELVIS WITH CONTRAST TECHNIQUE: Multidetector CT imaging of the abdomen and pelvis was performed using the standard protocol following bolus administration of intravenous contrast. CONTRAST:  152m ISOVUE-300 IOPAMIDOL (ISOVUE-300) INJECTION 61% COMPARISON:  Multiple CT scans since January 10, 2017 FINDINGS: Lower chest: Mild dependent atelectasis is identified. Lung bases are otherwise normal. Hepatobiliary: A small hemangioma is seen in the right hepatic lobe on series 2, image 16 is stable. No new suspicious hepatic masses are noted. The gallbladder is unremarkable. The portal vein remains patent. Pancreas: The mass near the tail of the pancreas measures 5.2 by 3.5 cm today versus 5 x 3.4 cm previously, similar in the interval. The mass invades the adjacent spleen, similar in the interval given difference in contrast timing. The previously identified pigtail catheter inferior to this mass has been removed. A tiny amount of fluid is seen in this region without abscess. Spleen: Invasion of the spleen with tumor as above, not significantly changed. Adrenals/Urinary Tract: The adrenal glands are normal. The kidneys are unremarkable with no obstruction. The bladder is normal. Stomach/Bowel: The stomach is normal. There dilated small bowel loops in the left side of the abdomen such as on series 2, image 40 measuring up to 4.9 cm. There is fecal material within the small bowel is well. There is a transition point seen in the right abdomen on series 2, images 41-45. There is a swirling of vessels in this region suggesting an internal hernia. The small bowel is somewhat decompressed more distally. There is a diverting colostomy in the right side of the abdomen. There is thickening of the colon extending from the distal transverse colon through the mid descending colon, consistent with the  site of patient's known malignancy. The amount of thickening is similar in the interval. The appendix is not visualized but there is no secondary evidence of appendicitis. Vascular/Lymphatic: Atherosclerosis in the distal aorta. No aneurysm or dissection. No retroperitoneal adenopathy. Reproductive: Prostate remains enlarged. Other: There is soft tissue medial to the colon on series 2, image 31 similar to slightly more prominent the interval measuring up to 3.2 cm. Musculoskeletal: The patient is status post surgery to the right hip. No other bony abnormalities. IMPRESSION: 1. Early or new small bowel obstruction with a transition point in the right abdomen as above. The adjacent swirling vessels are most consistent with an internal hernia. 2. The mass involving the pancreatic tail and spleen is similar in the interval. 3. The previously identified abscess inferior to the pancreatic mass has resolved with minimal fluid in this region. The tube is been removed. 4. Continued thickening of the colon as above consistent with the patient's known malignancy. 5. Atherosclerotic changes in the aorta. Aortic Atherosclerosis (ICD10-I70.0). Findings will be called to the referring clinical team. Electronically Signed: By: DDorise BullionIII M.D On: 08/15/2017 11:52   Ct  Abdomen Pelvis W Contrast  Result Date: 08/13/2017 CLINICAL DATA:  History of metastatic colon cancer. Status post abscess drainage. EXAM: CT ABDOMEN AND PELVIS WITH CONTRAST TECHNIQUE: Multidetector CT imaging of the abdomen and pelvis was performed using the standard protocol following bolus administration of intravenous contrast. CONTRAST:  42m ISOVUE-300 IOPAMIDOL (ISOVUE-300) INJECTION 61% COMPARISON:  CT scan of July 29, 2017. FINDINGS: Lower chest: No acute abnormality. Hepatobiliary: No focal liver abnormality is seen. No gallstones, gallbladder wall thickening, or biliary dilatation. Pancreas: Unremarkable. No pancreatic ductal dilatation or  surrounding inflammatory changes. Spleen: 5.0 x 3.4 cm multiloculated fluid collection is seen between the pancreatic tail and spleen which is not significantly changed compared to prior exam. Splenic parenchyma appears normal. Adrenals/Urinary Tract: Adrenal glands are unremarkable. Kidneys are normal, without renal calculi, focal lesion, or hydronephrosis. Bladder is unremarkable. Stomach/Bowel: There is interval placement of what appears to be a colostomy in the right upper quadrant of the abdomen. The stomach is unremarkable. Air-filled appendix is noted. Mild wall thickening of several loops of small bowel is noted without significant dilatation. This is concerning for some form of enteritis. Vascular/Lymphatic: Aortic atherosclerosis. No enlarged abdominal or pelvic lymph nodes. Reproductive: Stable prostatic enlargement. Other: Pigtail drainage catheter is again noted inferior to the spleen. Fluid collection appears to have resolved. Musculoskeletal: No acute or significant osseous findings. IMPRESSION: Aortic atherosclerosis. Interval placement of colostomy in the right upper quadrant of the abdomen. No significant bowel dilatation is seen at this time. However, diffuse wall thickening is seen involving several loops of small bowel concerning for inflammation or edema. Pigtail drainage catheter is again noted inferior to the spleen, and fluid collection appears to have resolved. 5.0 x 3.4 cm multi loculated fluid collection is again noted between the pancreatic tail and spleen which is not significantly changed compared to prior exam. Electronically Signed   By: JMarijo Conception M.D.   On: 08/13/2017 08:30   Ir Sinus/fist Tube Chk-non Gi  Result Date: 08/12/2017 INDICATION: 56year old with complex medical history including stage IV colon cancer and malignant perforation and abscess. Previous drain injection demonstrated a sinus tract to the pancreatic duct. Recently, the catheter has not been flushing.  Plan for additional evaluation of the drain with fluoroscopy and possible exchange. EXAM: SINUS TRACT INJECTION / FISTULOGRAM COMPARISON:  None. MEDICATIONS: None ANESTHESIA/SEDATION: None COMPLICATIONS: None immediate. TECHNIQUE: Drain was disconnected from the suction bulb. The drain was successfully flushed with a 3 ml syringe of normal saline. Subsequently, the drain was injected under fluoroscopy. Drain was flushed at the end of the procedure and attached to the suction bulb. PROCEDURE: Drain injection with fluoroscopy. FINDINGS: Pigtail drain remains in the left abdomen and unchanged in position. Again noted is filling of a small cavity near the drain. Contrast was also draining back onto the skin surface. Unable to identify a fistula tract to the pancreas on this examination. IMPRESSION: Drain was successfully flushed with saline. Drain injection again confirmed a small collection around the tube. Unable to identify a fistula tract on these images. Recommend repeat CT imaging to look for residual fluid collections in this area and discuss drain removal with General Surgery. Electronically Signed   By: AMarkus DaftM.D.   On: 08/12/2017 15:54   Ir Sinus/fist Tube Chk-non Gi  Result Date: 08/09/2017 CLINICAL DATA:  History of stage IV colon cancer complicated by a malignant perforation and development of a abscess within the tail of the pancreas, post CT-guided percutaneous drainage catheter placement on  07/11/2017 by Dr. [name]. Note, the percutaneous drainage catheter was subsequent exchanged on 07/19/2017. Patient has undergone interval diverting loop colostomy and request has in made for percutaneous drainage catheter injection. Given Dr. Dois Davenport high level of clinical concern for a pancreatic leak, there has been instructions NOT to exchange or remove the percutaneous drainage catheter at this time. EXAM: SINUS TRACT INJECTION/FISTULOGRAM COMPARISON:  CT abdomen pelvis - 07/29/2017; 07/10/2017; CT-guided  percutaneous drainage catheter placement - 07/11/2017; fluoroscopic guided drainage catheter exchange -07/19/2017. CONTRAST:  15 cc Isovue 300 FLUOROSCOPY TIME:  1 minute (9 mGy) TECHNIQUE: The patient was positioned supine on the fluoroscopy table. A preprocedural spot fluoroscopic image was obtained of the left upper abdominal quadrant and existing percutaneous drainage catheter Multiple spot fluoroscopic and radiographic images were obtained following the injection of a small amount of contrast via the existing percutaneous drainage catheter. Images were reviewed and the procedure was terminated. The catheter was flushed with a small amount of saline and reconnected to a JP bulb. FINDINGS: Contrast injection demonstrates opacification of the decompressed abscess cavity. There is reflux of contrast along the catheter tract to the entrance site at the skin surface. There is apparent communication between the decompressed abscess cavity and a serpiginous tubular structure within the left upper abdominal quadrant which while indeterminate may represent the pancreatic duct. IMPRESSION: Drainage catheter injection demonstrates the decompressed abscess cavity communicates with a serpiginous tubular structure within the left upper abdominal quadrant, indeterminate though potentially representative of the pancreatic duct. Correlation with amylase and lipase levels of the acquired fluid from the drainage catheter could be performed as clinically indicated. Electronically Signed   By: Sandi Mariscal M.D.   On: 08/09/2017 18:33   Ir Ivc Filter Plmt / S&i /img Guid/mod Sed  Result Date: 08/20/2017 INDICATION: Colon cancer, acute GI bleed on Xarelto for DVT. Anticoagulation has been stopped. EXAM: ULTRASOUND GUIDANCE FOR VASCULAR ACCESS IVC CATHETERIZATION AND VENOGRAM IVC FILTER INSERTION Date:  8/24/20188/24/2018 4:44 pm Radiologist:  M. Daryll Brod, MD Guidance:  Ultrasound and fluoroscopic CONTRAST:  50 cc Isovue-300  MEDICATIONS: 1% lidocaine local ANESTHESIA/SEDATION: 2.0 mg IV Versed; 100 mcg IV Fentanyl Moderate Sedation Time:  13 minutes The patient was continuously monitored during the procedure by the interventional radiology nurse under my direct supervision. FLUOROSCOPY TIME:  Fluoroscopy Time: 54 seconds (29 mGy). COMPLICATIONS: None immediate. 85m ISOVUE-300 IOPAMIDOL (ISOVUE-300) INJECTION 61%, 279mISOVUE-300 IOPAMIDOL (ISOVUE-300) INJECTION 61% PROCEDURE: Informed consent was obtained from the patient following explanation of the procedure, risks, benefits and alternatives. The patient understands, agrees and consents for the procedure. All questions were addressed. A time out was performed. Maximal barrier sterile technique utilized including caps, mask, sterile gowns, sterile gloves, large sterile drape, hand hygiene, and betadine prep. Under sterile condition and local anesthesia, right common femoral venous access was performed with ultrasound. Over a guide wire, the IVC filter delivery sheath and inner dilator were advanced into the IVC just above the IVC bifurcation. Contrast injection was performed for an IVC venogram. IVC VENOGRAM: The IVC is patent. No evidence of thrombus, stenosis, or occlusion. No variant venous anatomy. The renal veins are identified at L1-2. IVC FILTER INSERTION: Through the delivery sheath, the Bard Denali IVC filter was deployed in the infrarenal IVC at the L2-3 level just below the renal veins and above the IVC bifurcation. Contrast injection confirmed position. There is good apposition of the filter against the IVC. The delivery sheath was removed and hemostasis was obtained with compression for 5 minutes. The  patient tolerated the procedure well. No immediate complications. IMPRESSION: Ultrasound and fluoroscopically guided infrarenal IVC filter insertion. PLAN: Due to patient related comorbidities and/or clinical necessity, this IVC filter should be considered a permanent  device. This patient will not be actively followed for future filter retrieval. Electronically Signed   By: Jerilynn Mages.  Shick M.D.   On: 08/20/2017 16:48   Dg Chest Port 1 View  Result Date: 08/08/2017 CLINICAL DATA:  Cough today EXAM: PORTABLE CHEST 1 VIEW COMPARISON:  08/06/2017 FINDINGS: Stable right jugular Port-A-Cath. Normal heart size. Bibasilar atelectasis. No pneumothorax. NG tube tip is in the proximal duodenum. IMPRESSION: Bibasilar atelectasis. NG tube tip in the proximal duodenum.  This should be retracted. Electronically Signed   By: Marybelle Killings M.D.   On: 08/08/2017 10:57     Assessment/Plan   ICD-10-CM   1. Dehydration E86.0   2. Hypotension, unspecified hypotension type I95.9    probable orthostatic  3. Primary colon cancer with metastasis to other site ([place]) C18.9    peritoneal/pancreas  4. S/P colostomy ([place]) Z93.3    transverse loop; 2/2 obstructing tumor  5. Chronic deep vein thrombosis (DVT) of right lower extremity, unspecified vein ([facility]) I82.501    extensive; s/p IVC filter  6. Presence of IVC filter Z95.828    2/2 RLE DVT  7. 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 Severe malnutrition ([place]) E43     Hold PT/OT today - may resume in AM as tolerated  NSS via peripheral IV @ 100cc/hr x 2 L  Start Mag Oxide 429m daily due to low Mg level  Follow cmp, cbc w diff and Mg level  Cont other meds as ordered  F/u with Oncology Dr FBurr Medicoas scheduled  F/u with sx as scheduled  Palliative care to follow  GOAL: short term rehab and d/c home when medically appropriate. Prognosis is overall poor due to advanced cancer. Communicated with pt and nursing.  Will follow  Margaux Engen S. CPerlie Gold PElite Endoscopy LLCand Adult Medicine [address]GMacon Briscoe [phone] (Monday-Friday 8 AM - 5 PM) [phone] 5 PM and follow prompts

## 2017-09-06 ENCOUNTER — Non-Acute Institutional Stay (SKILLED_NURSING_FACILITY): Payer: Medicaid Other | Admitting: Adult Health

## 2017-09-06 ENCOUNTER — Encounter: Payer: Self-pay | Admitting: Internal Medicine

## 2017-09-06 ENCOUNTER — Other Ambulatory Visit: Payer: Self-pay

## 2017-09-06 ENCOUNTER — Encounter: Payer: Self-pay | Admitting: Adult Health

## 2017-09-06 DIAGNOSIS — C7889 Secondary malignant neoplasm of other digestive organs: Secondary | ICD-10-CM | POA: Diagnosis not present

## 2017-09-06 DIAGNOSIS — C189 Malignant neoplasm of colon, unspecified: Secondary | ICD-10-CM | POA: Diagnosis not present

## 2017-09-06 DIAGNOSIS — Z933 Colostomy status: Secondary | ICD-10-CM

## 2017-09-06 MED ORDER — OXYCODONE HCL 10 MG PO TABS: 10.0000 mg | ORAL_TABLET | Freq: Four times a day (QID) | ORAL | 0 refills | Status: DC | PRN

## 2017-09-06 NOTE — Telephone Encounter (Signed)
RX faxed to [facility] @ [phone], phone number [phone] 
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
## 2017-09-07 MED FILL — MIRTAZAPINE 15 MG TAB: 15 | 30 days supply | Qty: 30 | Fill #0

## 2017-09-07 MED FILL — PROCHLORPERAZINE 10 MG TAB: 10 | 30 days supply | Qty: 120 | Fill #0

## 2017-09-07 MED FILL — ONDANSETRON HCL 8 MG TAB: 8 | 30 days supply | Qty: 60 | Fill #0

## 2017-09-07 MED FILL — oxyCODONE HCL 10 MG TABS: 10 | 2 days supply | Qty: 10 | Fill #0

## 2017-09-09 ENCOUNTER — Telehealth: Payer: Self-pay | Admitting: Family Medicine

## 2017-09-09 NOTE — Telephone Encounter (Signed)
[facility] call to let you know that they weill start home care 09/10/17,,, for you info

## 2017-09-09 NOTE — Telephone Encounter (Signed)
Noted. Will route to PCP 

## 2017-09-13 NOTE — Telephone Encounter (Signed)
Noted  

## 2017-09-14 ENCOUNTER — Telehealth: Payer: Self-pay

## 2017-09-14 ENCOUNTER — Telehealth: Payer: Self-pay | Admitting: *Deleted

## 2017-09-14 NOTE — Telephone Encounter (Signed)
Tc from daughter requesting [facility] to re admit her father into program after discharge from skilled care . Nurse will call administrators and see if we can readmit client . Client needs housing until his apartment is ready in about two weeks. TC to [facility] and Emelda Fear, after several calls ,safety an issue ,daughter will arrange for someone to be with [facility] for several days upon discharge and  [facility] will re open case . Notified daughter of agreement and plans to house [facility] after discharged were  arranged .

## 2017-09-14 NOTE — Telephone Encounter (Signed)
Pt called for appt with Dr Burr Medico.  Call transferred to schedulers  [phone]
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
## 2017-09-14 NOTE — Congregational Nurse Program (Signed)
Congregational Nurse Program Note  Date of Encounter: 09/14/2017  Past Medical History: Past Medical History:  Diagnosis Date  . Acute deep vein thrombosis (DVT) of right lower extremity ([place]) 02/18/2017  . Malignant neoplasm of abdomen ([place])    [name] 01/11/2017  . Microcytic anemia    /notes 01/11/2017    Encounter Details:     CNP Questionnaire - 09/14/17 1943      Patient Demographics   Is this a new or existing patient? Existing   Patient is considered a/an Not Applicable   Race African-American/Black     Patient Assistance   Location of Patient Assistance [facility] patient   Patient's financial/insurance status Medicaid   Uninsured Patient ([facility]) No   Patient referred to apply for the following financial assistance Not Applicable   Food insecurities addressed Not Applicable   Transportation assistance No   Assistance securing medications No   Educational health offerings Chronic disease;Cancer     Encounter Details   Primary purpose of visit Spiritual Care/Support Visit;Education/Health Concerns   Was an Emergency Department visit averted? Not Applicable   Does patient have a medical provider? Yes   Patient referred to Clinic;Follow up with established PCP   Was a mental health screening completed? (GAINS tool) No   Does patient have dental issues? No   Does patient have vision issues? No   Does your patient have an abnormal blood pressure today? No   Since previous encounter, have you referred patient for abnormal blood pressure that resulted in a new diagnosis or medication change? No   Does your patient have an abnormal blood glucose today? No   Since previous encounter, have you referred patient for abnormal blood glucose that resulted in a new diagnosis or medication change? No   Was there a life-saving intervention made? No     [facility] team follow up visit today . Client up and about looks good ,eating and drinking ,has a lot of energy .driving  his truck . Hasn't heard final word about his apartment yet ,hopefully by next Friday ,and will need an extension in [facility] since he may not know until next Friday and discharge date was 09-21-17. Nurse will request today.Client upset that his colostomy nurse was not there on yesterday ,states he called but never showed up ,nurse called [facility] ,but as she was talking with service his home care nurse called and arrived during our visit but was unable to change his bag because supplies were not there, Home care nurse that visited last week was to have ordered them ?? Mr Lupita Leash has some supplies at his daughters home ,he will pick those up and nurse will return to change his stoma tomorrow am . Client not happy . Nurse needs a number to order his supplies . Hopefully supplies will arrive by next week . Client was seen last week for follow up with surgeon and his oncology visit ,waiting on a call for his treatment  Schedule  Will stop by hospital tomorrow . Client will call if he gets word on his apartment before next week.  States he is eating good and drinking . Has gained 11 lbs he states. Will complete the ret of  [employer] application on next week  And turn in .  Next visit 09-20-17 @ 11 am

## 2017-09-17 ENCOUNTER — Telehealth: Payer: Self-pay | Admitting: Hematology

## 2017-09-17 NOTE — Telephone Encounter (Signed)
Spoke with patient re appointments for 9/24 and 9/25. Patient will get updated schedule 9/24.

## 2017-09-20 ENCOUNTER — Other Ambulatory Visit (HOSPITAL_BASED_OUTPATIENT_CLINIC_OR_DEPARTMENT_OTHER): Payer: Medicaid Other

## 2017-09-20 ENCOUNTER — Encounter: Payer: Self-pay | Admitting: Hematology

## 2017-09-20 ENCOUNTER — Ambulatory Visit (HOSPITAL_BASED_OUTPATIENT_CLINIC_OR_DEPARTMENT_OTHER): Payer: Medicaid Other | Admitting: Hematology

## 2017-09-20 ENCOUNTER — Telehealth: Payer: Self-pay | Admitting: Hematology

## 2017-09-20 ENCOUNTER — Ambulatory Visit (HOSPITAL_BASED_OUTPATIENT_CLINIC_OR_DEPARTMENT_OTHER): Payer: Medicaid Other

## 2017-09-20 VITALS — BP 116/78 | HR 101 | Temp 98.8°F | Resp 16 | Ht 69.0 in | Wt 131.7 lb

## 2017-09-20 DIAGNOSIS — C186 Malignant neoplasm of descending colon: Secondary | ICD-10-CM

## 2017-09-20 DIAGNOSIS — D509 Iron deficiency anemia, unspecified: Secondary | ICD-10-CM

## 2017-09-20 DIAGNOSIS — C185 Malignant neoplasm of splenic flexure: Secondary | ICD-10-CM

## 2017-09-20 DIAGNOSIS — D630 Anemia in neoplastic disease: Secondary | ICD-10-CM | POA: Diagnosis not present

## 2017-09-20 DIAGNOSIS — Z86718 Personal history of other venous thrombosis and embolism: Secondary | ICD-10-CM | POA: Diagnosis not present

## 2017-09-20 DIAGNOSIS — Z452 Encounter for adjustment and management of vascular access device: Secondary | ICD-10-CM | POA: Diagnosis present

## 2017-09-20 DIAGNOSIS — Z95828 Presence of other vascular implants and grafts: Secondary | ICD-10-CM

## 2017-09-20 DIAGNOSIS — C189 Malignant neoplasm of colon, unspecified: Secondary | ICD-10-CM

## 2017-09-20 DIAGNOSIS — D500 Iron deficiency anemia secondary to blood loss (chronic): Secondary | ICD-10-CM

## 2017-09-20 DIAGNOSIS — C786 Secondary malignant neoplasm of retroperitoneum and peritoneum: Secondary | ICD-10-CM | POA: Diagnosis not present

## 2017-09-20 DIAGNOSIS — C762 Malignant neoplasm of abdomen: Secondary | ICD-10-CM

## 2017-09-20 LAB — CBC WITH DIFFERENTIAL/PLATELET
BASO%: 0.7 % (ref 0.0–2.0)
BASOS ABS: 0 10*3/uL (ref 0.0–0.1)
EOS%: 2.5 % (ref 0.0–7.0)
Eosinophils Absolute: 0.1 10*3/uL (ref 0.0–0.5)
HCT: 27.3 % — ABNORMAL LOW (ref 38.4–49.9)
HEMOGLOBIN: 8.5 g/dL — AB (ref 13.0–17.1)
LYMPH%: 28.9 % (ref 14.0–49.0)
MCH: 26.2 pg — ABNORMAL LOW (ref 27.2–33.4)
MCHC: 31.1 g/dL — ABNORMAL LOW (ref 32.0–36.0)
MCV: 84.3 fL (ref 79.3–98.0)
MONO#: 0.4 10*3/uL (ref 0.1–0.9)
MONO%: 7.8 % (ref 0.0–14.0)
NEUT%: 60.1 % (ref 39.0–75.0)
NEUTROS ABS: 3.3 10*3/uL (ref 1.5–6.5)
PLATELETS: 131 10*3/uL — AB (ref 140–400)
RBC: 3.24 10*6/uL — ABNORMAL LOW (ref 4.20–5.82)
RDW: 14.1 % (ref 11.0–14.6)
WBC: 5.5 10*3/uL (ref 4.0–10.3)
lymph#: 1.6 10*3/uL (ref 0.9–3.3)

## 2017-09-20 LAB — COMPREHENSIVE METABOLIC PANEL
ALBUMIN: 3.1 g/dL — AB (ref 3.5–5.0)
ALK PHOS: 101 U/L (ref 40–150)
ALT: <3 U/L (ref 0–55)
ANION GAP: 7 meq/L (ref 3–11)
AST: 14 U/L (ref 5–34)
BILIRUBIN TOTAL: 0.38 mg/dL (ref 0.20–1.20)
BUN: 10 mg/dL (ref 7.0–26.0)
CALCIUM: 9.3 mg/dL (ref 8.4–10.4)
CO2: 25 mEq/L (ref 22–29)
Chloride: 108 mEq/L (ref 98–109)
Creatinine: 0.9 mg/dL (ref 0.7–1.3)
Glucose: 109 mg/dl (ref 70–140)
Potassium: 3.5 mEq/L (ref 3.5–5.1)
Sodium: 139 mEq/L (ref 136–145)
TOTAL PROTEIN: 8 g/dL (ref 6.4–8.3)

## 2017-09-20 LAB — MAGNESIUM: MAGNESIUM: 1.8 mg/dL (ref 1.5–2.5)

## 2017-09-20 MED ORDER — SODIUM CHLORIDE 0.9% FLUSH
10.0000 mL | INTRAVENOUS | Status: DC | PRN
  Administered 2017-09-20: 10 mL via INTRAVENOUS
  Filled 2017-09-20: qty 10

## 2017-09-20 MED ORDER — OXYCODONE HCL 10 MG PO TABS: 10.0000 mg | ORAL_TABLET | Freq: Four times a day (QID) | ORAL | 0 refills | Status: DC | PRN

## 2017-09-20 MED FILL — oxyCODONE HCL 10 MG TABS: 10 | 23 days supply | Qty: 90 | Fill #0

## 2017-09-20 NOTE — Progress Notes (Signed)
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 well we will changed to FOLFIRI with dose reduction     2. History of right LE DVT -I previously reviewed his ultrasound results from 02/17/17, which showed DVT involving the femoral, popliteal, and peritoneal vein of the right lower extremity from 2/22. -He had history of right lower extremity DVT after surgery before  - his  Xarelto has been held due to GI bleeding he has a IVC. Placed  3. 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 -- continue oxycodone as needed  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 -He has  received IV Feraheme twice, but his anemia has not improved. He likely has component of anemia of chronic disease secondary to his underlying malignancy.  5. Weight loss, malnutrition, anorexia -Secondary to chemotherapy and cancer -The patient will continue to drink dietary supplements. -He has gained some weight lately - I again strongly encouraged him to try to eat more to avoid losing excess weight. I again recommended he  drink 2 ensure or boost supplements a day to help. - I prescribed mirtazapine on 05/25/17 in attempts to help his appetite. He knows to take it once every evening. - continue follow-up withdietitian  6. Pancreatic Abscess -Presents to hospital on 07/09/17 for abdominal pain. Based on SP CT from 07/10/17 he found to have an abscess. Draining tube placed and he received prolonged course of antibiotics  - His percutaneous drainage tube has been removed  -this is likely resolved now   7. Bowel obstruction due to his colon mass at the splenic flexure and surrounding inflammation due to recent abscess, s/u diverting colostomy  -He was hospitalized on 07/29/17 for SBO  -He had sigmoidoscopy and loop Colostomy and has a J tube placed.  - he is tolerating diet well now. -Bowel movements overall have been normal, but sometimes he has softer stools and are more loose. There seems to be some confusion regarding taking his immodium and colace. I explained to him the usage of both of these medication and in what context he should be using them both for his stool consistencies.   8. Goal of care discussion  -We again discussed the incurable nature of his cancer, and the overall poor prognosis, especially if he does not have good response to chemotherapy or progress on chemo -The patient understands the goal of care is palliative. -I have recommended DNR/DNI, he will think about it.  Plan: -He will restart her chemotherapy tomorrow, with irinotecan and panitumumab. If he tolerates well, will add 5-fu back, dose reduce due to overall limited PS  -Lab, flush, f/u and FOLFIRI and panitumumab in 2 and 4 weeks -Refill hydrocodone   All questions were answered. The patient knows to call the clinic with any problems, questions or concerns.  I spent 25 minutes counseling the patient face to face. The total time spent in the appointment was  30 minutes and more than 50% was on counseling.  This document serves as a  record of services personally performed by [name], MD. It was created on her behalf by Reola Mosher, a trained medical scribe. The creation of this record is based on the scribe's personal observations and the provider's statements to them. This document has been checked and approved by the attending provider.   [name], MD 09/20/2017

## 2017-09-20 NOTE — Patient Instructions (Signed)
Implanted Port Home Guide An implanted port is a type of central line that is placed under the skin. Central lines are used to provide IV access when treatment or nutrition needs to be given through a person's veins. Implanted ports are used for long-term IV access. An implanted port may be placed because:  You need IV medicine that would be irritating to the small veins in your hands or arms.  You need long-term IV medicines, such as antibiotics.  You need IV nutrition for a long period.  You need frequent blood draws for lab tests.  You need dialysis.  Implanted ports are usually placed in the chest area, but they can also be placed in the upper arm, the abdomen, or the leg. An implanted port has two main parts:  Reservoir. The reservoir is round and will appear as a small, raised area under your skin. The reservoir is the part where a needle is inserted to give medicines or draw blood.  Catheter. The catheter is a thin, flexible tube that extends from the reservoir. The catheter is placed into a large vein. Medicine that is inserted into the reservoir goes into the catheter and then into the vein.  How will I care for my incision site? Do not get the incision site wet. Bathe or shower as directed by your health care provider. How is my port accessed? Special steps must be taken to access the port:  Before the port is accessed, a numbing cream can be placed on the skin. This helps numb the skin over the port site.  Your health care provider uses a sterile technique to access the port. ? Your health care provider must put on a mask and sterile gloves. ? The skin over your port is cleaned carefully with an antiseptic and allowed to dry. ? The port is gently pinched between sterile gloves, and a needle is inserted into the port.  Only "non-coring" port needles should be used to access the port. Once the port is accessed, a blood return should be checked. This helps ensure that the port  is in the vein and is not clogged.  If your port needs to remain accessed for a constant infusion, a clear (transparent) bandage will be placed over the needle site. The bandage and needle will need to be changed every week, or as directed by your health care provider.  Keep the bandage covering the needle clean and dry. Do not get it wet. Follow your health care provider's instructions on how to take a shower or bath while the port is accessed.  If your port does not need to stay accessed, no bandage is needed over the port.  What is flushing? Flushing helps keep the port from getting clogged. Follow your health care provider's instructions on how and when to flush the port. Ports are usually flushed with saline solution or a medicine called heparin. The need for flushing will depend on how the port is used.  If the port is used for intermittent medicines or blood draws, the port will need to be flushed: ? After medicines have been given. ? After blood has been drawn. ? As part of routine maintenance.  If a constant infusion is running, the port may not need to be flushed.  How long will my port stay implanted? The port can stay in for as long as your health care provider thinks it is needed. When it is time for the port to come out, surgery will be   done to remove it. The procedure is similar to the one performed when the port was put in. When should I seek immediate medical care? When you have an implanted port, you should seek immediate medical care if:  You notice a bad smell coming from the incision site.  You have swelling, redness, or drainage at the incision site.  You have more swelling or pain at the port site or the surrounding area.  You have a fever that is not controlled with medicine.  This information is not intended to replace advice given to you by your health care provider. Make sure you discuss any questions you have with your health care provider. Document  Released: 12/14/2005 Document Revised: 05/21/2016 Document Reviewed: 08/21/2013 Elsevier Interactive Patient Education  2017 [employer]  

## 2017-09-20 NOTE — Telephone Encounter (Signed)
Spoke to patient regarding upcoming October appointments. °

## 2017-09-21 ENCOUNTER — Ambulatory Visit (HOSPITAL_BASED_OUTPATIENT_CLINIC_OR_DEPARTMENT_OTHER): Payer: Medicaid Other

## 2017-09-21 VITALS — BP 116/75 | HR 98 | Temp 99.1°F | Resp 17

## 2017-09-21 DIAGNOSIS — Z5112 Encounter for antineoplastic immunotherapy: Secondary | ICD-10-CM | POA: Diagnosis not present

## 2017-09-21 DIAGNOSIS — Z5111 Encounter for antineoplastic chemotherapy: Secondary | ICD-10-CM | POA: Diagnosis not present

## 2017-09-21 DIAGNOSIS — C189 Malignant neoplasm of colon, unspecified: Secondary | ICD-10-CM

## 2017-09-21 DIAGNOSIS — C185 Malignant neoplasm of splenic flexure: Secondary | ICD-10-CM | POA: Diagnosis not present

## 2017-09-21 MED ORDER — PALONOSETRON HCL INJECTION 0.25 MG/5ML
INTRAVENOUS | Status: AC
  Filled 2017-09-21: qty 5

## 2017-09-21 MED ORDER — HEPARIN SOD (PORK) LOCK FLUSH 100 UNIT/ML IV SOLN
500.0000 [IU] | Freq: Once | INTRAVENOUS | Status: AC | PRN
  Administered 2017-09-21: 500 [IU]
  Filled 2017-09-21: qty 5

## 2017-09-21 MED ORDER — SODIUM CHLORIDE 0.9% FLUSH
10.0000 mL | INTRAVENOUS | Status: DC | PRN
Start: 2017-09-21 — End: 2017-09-21
  Administered 2017-09-21: 10 mL
  Filled 2017-09-21: qty 10

## 2017-09-21 MED ORDER — DEXAMETHASONE SODIUM PHOSPHATE 10 MG/ML IJ SOLN
INTRAMUSCULAR | Status: AC
  Filled 2017-09-21: qty 1

## 2017-09-21 MED ORDER — ATROPINE SULFATE 1 MG/ML IJ SOLN
0.5000 mg | Freq: Once | INTRAMUSCULAR | Status: AC | PRN
Start: 2017-09-21 — End: 2017-09-21
  Administered 2017-09-21: 0.5 mg via INTRAVENOUS

## 2017-09-21 MED ORDER — DEXAMETHASONE SODIUM PHOSPHATE 10 MG/ML IJ SOLN
10.0000 mg | Freq: Once | INTRAMUSCULAR | Status: AC
  Administered 2017-09-21: 10 mg via INTRAVENOUS

## 2017-09-21 MED ORDER — SODIUM CHLORIDE 0.9 % IV SOLN
Freq: Once | INTRAVENOUS | Status: AC
  Administered 2017-09-21: 11:00:00 via INTRAVENOUS

## 2017-09-21 MED ORDER — ATROPINE SULFATE 1 MG/ML IJ SOLN
INTRAMUSCULAR | Status: AC
  Filled 2017-09-21: qty 1

## 2017-09-21 MED ORDER — SODIUM CHLORIDE 0.9 % IV SOLN
6.5000 mg/kg | Freq: Once | INTRAVENOUS | Status: AC
  Administered 2017-09-21: 400 mg via INTRAVENOUS
  Filled 2017-09-21: qty 20

## 2017-09-21 MED ORDER — PALONOSETRON HCL INJECTION 0.25 MG/5ML
0.2500 mg | Freq: Once | INTRAVENOUS | Status: AC
  Administered 2017-09-21: 0.25 mg via INTRAVENOUS

## 2017-09-21 MED ORDER — IRINOTECAN HCL CHEMO INJECTION 100 MG/5ML
150.0000 mg/m2 | Freq: Once | INTRAVENOUS | Status: AC
  Administered 2017-09-21: 240 mg via INTRAVENOUS
  Filled 2017-09-21: qty 2

## 2017-09-21 MED ORDER — SODIUM CHLORIDE 0.9 % IV SOLN: 10.0000 mg | Freq: Once | INTRAVENOUS | Status: DC

## 2017-09-21 NOTE — Patient Instructions (Signed)
[facility] Discharge Instructions for Patients Receiving Chemotherapy  Today you received the following chemotherapy agents Irinotecan, Vectibix  To help prevent nausea and vomiting after your treatment, we encourage you to take your nausea medication as directed   If you develop nausea and vomiting that is not controlled by your nausea medication, call the clinic.   BELOW ARE SYMPTOMS THAT SHOULD BE REPORTED IMMEDIATELY:  *FEVER GREATER THAN 100.5 F  *CHILLS WITH OR WITHOUT FEVER  NAUSEA AND VOMITING THAT IS NOT CONTROLLED WITH YOUR NAUSEA MEDICATION  *UNUSUAL SHORTNESS OF BREATH  *UNUSUAL BRUISING OR BLEEDING  TENDERNESS IN MOUTH AND THROAT WITH OR WITHOUT PRESENCE OF ULCERS  *URINARY PROBLEMS  *BOWEL PROBLEMS  UNUSUAL RASH Items with * indicate a potential emergency and should be followed up as soon as possible.  Feel free to call the clinic should you have any questions or concerns. The clinic phone number is [phone].  Please show the [facility] at check-in to the Emergency Department and triage nurse.      Irinotecan injection What is this medicine? IRINOTECAN (ir in oh TEE kan ) is a chemotherapy drug. It is used to treat colon and rectal cancer. This medicine may be used for other purposes; ask your health care provider or pharmacist if you have questions. COMMON BRAND NAME(S): Camptosar What should I tell my health care provider before I take this medicine? They need to know if you have any of these conditions: -blood disorders -dehydration -diarrhea -infection (especially a virus infection such as chickenpox, cold sores, or herpes) -liver disease -low blood counts, like low white cell, platelet, or red cell counts -recent or ongoing radiation therapy -an unusual or allergic reaction to irinotecan, sorbitol, other chemotherapy, other medicines, foods, dyes, or preservatives -pregnant or trying to get pregnant -breast-feeding How  should I use this medicine? This drug is given as an infusion into a vein. It is administered in a hospital or clinic by a specially trained health care professional. Talk to your pediatrician regarding the use of this medicine in children. Special care may be needed. Overdosage: If you think you have taken too much of this medicine contact a poison control center or emergency room at once. NOTE: This medicine is only for you. Do not share this medicine with others. What if I miss a dose? It is important not to miss your dose. Call your doctor or health care professional if you are unable to keep an appointment. What may interact with this medicine? Do not take this medicine with any of the following medications: -atazanavir -certain medicines for fungal infections like itraconazole and ketoconazole -St. John's Wort This medicine may also interact with the following medications: -dexamethasone -diuretics -laxatives -medicines for seizures like carbamazepine, mephobarbital, phenobarbital, phenytoin, primidone -medicines to increase blood counts like filgrastim, pegfilgrastim, sargramostim -prochlorperazine -vaccines This list may not describe all possible interactions. Give your health care provider a list of all the medicines, herbs, non-prescription drugs, or dietary supplements you use. Also tell them if you smoke, drink alcohol, or use illegal drugs. Some items may interact with your medicine. What should I watch for while using this medicine? Your condition will be monitored carefully while you are receiving this medicine. You will need important blood work done while you are taking this medicine. This drug may make you feel generally unwell. This is not uncommon, as chemotherapy can affect healthy cells as well as cancer cells. Report any side effects. Continue your course of  treatment even though you feel ill unless your doctor tells you to stop. In some cases, you may be given  additional medicines to help with side effects. Follow all directions for their use. You may get drowsy or dizzy. Do not drive, use machinery, or do anything that needs mental alertness until you know how this medicine affects you. Do not stand or sit up quickly, especially if you are an older patient. This reduces the risk of dizzy or fainting spells. Call your doctor or health care professional for advice if you get a fever, chills or sore throat, or other symptoms of a cold or flu. Do not treat yourself. This drug decreases your body's ability to fight infections. Try to avoid being around people who are sick. This medicine may increase your risk to bruise or bleed. Call your doctor or health care professional if you notice any unusual bleeding. Be careful brushing and flossing your teeth or using a toothpick because you may get an infection or bleed more easily. If you have any dental work done, tell your dentist you are receiving this medicine. Avoid taking products that contain aspirin, acetaminophen, ibuprofen, naproxen, or ketoprofen unless instructed by your doctor. These medicines may hide a fever. Do not become pregnant while taking this medicine. Women should inform their doctor if they wish to become pregnant or think they might be pregnant. There is a potential for serious side effects to an unborn child. Talk to your health care professional or pharmacist for more information. Do not breast-feed an infant while taking this medicine. What side effects may I notice from receiving this medicine? Side effects that you should report to your doctor or health care professional as soon as possible: -allergic reactions like skin rash, itching or hives, swelling of the face, lips, or tongue -low blood counts - this medicine may decrease the number of white blood cells, red blood cells and platelets. You may be at increased risk for infections and bleeding. -signs of infection - fever or chills,  cough, sore throat, pain or difficulty passing urine -signs of decreased platelets or bleeding - bruising, pinpoint red spots on the skin, black, tarry stools, blood in the urine -signs of decreased red blood cells - unusually weak or tired, fainting spells, lightheadedness -breathing problems -chest pain -diarrhea -feeling faint or lightheaded, falls -flushing, runny nose, sweating during infusion -mouth sores or pain -pain, swelling, redness or irritation where injected -pain, swelling, warmth in the leg -pain, tingling, numbness in the hands or feet -problems with balance, talking, walking -stomach cramps, pain -trouble passing urine or change in the amount of urine -vomiting as to be unable to hold down drinks or food -yellowing of the eyes or skin Side effects that usually do not require medical attention (report to your doctor or health care professional if they continue or are bothersome): -constipation -hair loss -headache -loss of appetite -nausea, vomiting -stomach upset This list may not describe all possible side effects. Call your doctor for medical advice about side effects. You may report side effects to FDA at 1-800-FDA-1088. Where should I keep my medicine? This drug is given in a hospital or clinic and will not be stored at home. NOTE: This sheet is a summary. It may not cover all possible information. If you have questions about this medicine, talk to your doctor, pharmacist, or health care provider.  2018 Elsevier/Gold Standard (2013-06-12 16:29:32)

## 2017-09-21 NOTE — Progress Notes (Signed)
Printed and verbal discharge instructions given to pt. Pt verbalizes understanding. Pt stable at discharge.

## 2017-09-22 ENCOUNTER — Ambulatory Visit: Payer: Medicaid Other | Admitting: Family Medicine

## 2017-09-23 NOTE — Congregational Nurse Program (Signed)
Congregational Nurse Program Note  Date of Encounter: 09/22/2017  Past Medical History: Past Medical History:  Diagnosis Date  . Acute deep vein thrombosis (DVT) of right lower extremity ([place]) 02/18/2017  . 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 Microcytic anemia    [name] 01/11/2017    Encounter Details:     CNP Questionnaire - 09/23/17 1751      Patient Assistance   Location of Patient Assistance [facility] patient     [facility] team visit  Today . Client call on teams way to visit with him states he unable to get into his room on last night key would not work .Marland Kitchen Several call to determine what happened one call to [facility] 6 and front desk explained that sometimes keys if left near a cell phone or credit card can deactivate the key and that may have occurred because his room was okayed until 09-28-2017. Client has resumed  his chemo  Treatments and is doing well . At the hospital on yesterday  For a treatment. . Driving himself around today . Client qualified  For his apartment and plans are to move in 10-1 or 10-2  To assist client with [facility] application and letter of support .  Will make a visit to his apartment  To assist with completing application. Visit  scheduled for Monday .

## 2017-09-28 NOTE — Congregational Nurse Program (Signed)
Congregational Nurse Program Note  Date of Encounter: 09/28/2017  Past Medical History: Past Medical History:  Diagnosis Date  . Acute deep vein thrombosis (DVT) of right lower extremity ([place]) 02/18/2017  . 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 Microcytic anemia    [name] 01/11/2017    Encounter Details:     CNP Questionnaire - 09/28/17 2252      Patient Demographics   Is this a new or existing patient? Existing   Patient is considered a/an Not Applicable   Race African-American/Black     Patient Assistance   Location of Patient Assistance [facility] patient   Patient's financial/insurance status Medicaid   Uninsured Patient ([facility]) No   Patient referred to apply for the following financial assistance Not Applicable   Food insecurities addressed Referred to food bank or resource   Transportation assistance No   Assistance securing medications No   Educational health offerings Cancer;Navigating the healthcare system;Nutrition     Encounter Details   Primary purpose of visit Chronic Illness/Condition Visit;Education/Health Concerns;Spiritual Care/Support Visit;Post PCP Visit   Was an Emergency Department visit averted? Not Applicable   Does patient have a medical provider? Yes   Patient referred to Follow up with established PCP   Was a mental health screening completed? (GAINS tool) No   Does patient have dental issues? No   Does patient have vision issues? No   Does your patient have an abnormal blood pressure today? No   Since previous encounter, have you referred patient for abnormal blood pressure that resulted in a new diagnosis or medication change? No   Does your patient have an abnormal blood glucose today? No   Since previous encounter, have you referred patient for abnormal blood glucose that resulted in a new diagnosis or medication change? No   Was there a life-saving intervention made? No    Viist to check on clients  apartment and complete [place] application ,completed , client started moving in today . Very pleased with getting his place . Friends to help him move in and family . Client will need bedroom suit ,kitchen set and sofa . He lost most of his things when he got sick.. New address [place].Cautioned client not  To do to much and  To take good  Care of  Self. Nurse will help with getting food  From [employer] to get him started . Gave client  a blessing  Bag.Terminiated  [facility] visits today ,client signed form and nurse explained that goals had been met and wished him well .

## 2017-09-30 ENCOUNTER — Other Ambulatory Visit: Payer: Self-pay | Admitting: Hematology

## 2017-10-01 NOTE — Progress Notes (Addendum)
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 he found to have an abscess. Draining tube placed and he received prolonged course of antibiotics  - His percutaneous drainage tube has been removed  -this is likely resolved now   7. Bowel obstruction due to his colon mass at the splenic flexure and surrounding inflammation due to recent abscess, s/u diverting colostomy  -He was hospitalized on 07/29/17 for SBO  -He had sigmoidoscopy and loop Colostomy and has a J tube placed.  - he is tolerating diet well now. -Bowel movements overall have been normal, but  sometimes he has softer stools and are more loose.  -I again reviewed the usage of both imodium and colace and in what context he should be using them both for his stool consistencies.  -He has colostomy prolapse, he knows to avoid lifting and carry heavy things   8. Goal of care discussion  -The patient understands the goal of care is palliative. -[name] recommended DNR/DNI, he will think about it.  Plan: -labs reviewed, adequate for treatment; he is anemic, he does not need transfusion at this time -proceed with cycle 2  Irinotecan and vectibix, add 5FU pump with dose reduction to cycle 2 -return in 2 weeks for lab, flush, NP f/u, and chemo FOLFIRI/vectibix -increase nutritional supplement to 2 drinks per day   All questions were answered. The patient knows to call the clinic with any problems, questions or concerns.   Cira Rue, AGNP-C 10/04/2017  I have seen the patient, examined him. I agree with the assessment and and plan and have edited the notes.   If he tolerates the dose reduced cycle 2 well, will increase FOLFIRI to full dose without bolus from next cycle   [name] 10/04/2017

## 2017-10-04 ENCOUNTER — Ambulatory Visit (HOSPITAL_BASED_OUTPATIENT_CLINIC_OR_DEPARTMENT_OTHER): Payer: Medicaid Other

## 2017-10-04 ENCOUNTER — Telehealth: Payer: Self-pay | Admitting: Hematology

## 2017-10-04 ENCOUNTER — Other Ambulatory Visit (HOSPITAL_BASED_OUTPATIENT_CLINIC_OR_DEPARTMENT_OTHER): Payer: Medicaid Other

## 2017-10-04 ENCOUNTER — Ambulatory Visit (HOSPITAL_BASED_OUTPATIENT_CLINIC_OR_DEPARTMENT_OTHER): Payer: Medicaid Other | Admitting: Hematology

## 2017-10-04 ENCOUNTER — Encounter: Payer: Self-pay | Admitting: Hematology

## 2017-10-04 VITALS — BP 119/81 | HR 85 | Temp 97.7°F | Resp 18 | Ht 69.0 in | Wt 124.0 lb

## 2017-10-04 DIAGNOSIS — Z5112 Encounter for antineoplastic immunotherapy: Secondary | ICD-10-CM

## 2017-10-04 DIAGNOSIS — Z452 Encounter for adjustment and management of vascular access device: Secondary | ICD-10-CM

## 2017-10-04 DIAGNOSIS — Z5111 Encounter for antineoplastic chemotherapy: Secondary | ICD-10-CM

## 2017-10-04 DIAGNOSIS — C186 Malignant neoplasm of descending colon: Secondary | ICD-10-CM

## 2017-10-04 DIAGNOSIS — C786 Secondary malignant neoplasm of retroperitoneum and peritoneum: Secondary | ICD-10-CM

## 2017-10-04 DIAGNOSIS — Z95828 Presence of other vascular implants and grafts: Secondary | ICD-10-CM

## 2017-10-04 DIAGNOSIS — C762 Malignant neoplasm of abdomen: Secondary | ICD-10-CM

## 2017-10-04 DIAGNOSIS — D630 Anemia in neoplastic disease: Secondary | ICD-10-CM

## 2017-10-04 DIAGNOSIS — C189 Malignant neoplasm of colon, unspecified: Secondary | ICD-10-CM

## 2017-10-04 DIAGNOSIS — Z86718 Personal history of other venous thrombosis and embolism: Secondary | ICD-10-CM

## 2017-10-04 DIAGNOSIS — D509 Iron deficiency anemia, unspecified: Secondary | ICD-10-CM

## 2017-10-04 DIAGNOSIS — D500 Iron deficiency anemia secondary to blood loss (chronic): Secondary | ICD-10-CM

## 2017-10-04 LAB — COMPREHENSIVE METABOLIC PANEL
ALT: 6 U/L (ref 0–55)
ANION GAP: 7 meq/L (ref 3–11)
AST: 20 U/L (ref 5–34)
Albumin: 3.6 g/dL (ref 3.5–5.0)
Alkaline Phosphatase: 99 U/L (ref 40–150)
BILIRUBIN TOTAL: 0.29 mg/dL (ref 0.20–1.20)
BUN: 7.5 mg/dL (ref 7.0–26.0)
CO2: 26 meq/L (ref 22–29)
CREATININE: 0.8 mg/dL (ref 0.7–1.3)
Calcium: 9.5 mg/dL (ref 8.4–10.4)
Chloride: 105 mEq/L (ref 98–109)
EGFR: >90 mL/min/{1.73_m2} (ref >90)
GLUCOSE: 111 mg/dL (ref 70–140)
Potassium: 3.4 mEq/L — ABNORMAL LOW (ref 3.5–5.1)
Sodium: 138 mEq/L (ref 136–145)
TOTAL PROTEIN: 8.7 g/dL — AB (ref 6.4–8.3)

## 2017-10-04 LAB — IRON AND TIBC
%SAT: 14 % — ABNORMAL LOW (ref 20–55)
IRON: 36 ug/dL — AB (ref 42–163)
TIBC: 258 ug/dL (ref 202–409)
UIBC: 221 ug/dL (ref 117–376)

## 2017-10-04 LAB — CBC WITH DIFFERENTIAL/PLATELET
BASO%: 1.3 % (ref 0.0–2.0)
Basophils Absolute: 0.1 10*3/uL (ref 0.0–0.1)
EOS%: 3.4 % (ref 0.0–7.0)
Eosinophils Absolute: 0.2 10*3/uL (ref 0.0–0.5)
HCT: 30.5 % — ABNORMAL LOW (ref 38.4–49.9)
HGB: 9.8 g/dL — ABNORMAL LOW (ref 13.0–17.1)
LYMPH#: 1.3 10*3/uL (ref 0.9–3.3)
LYMPH%: 26.6 % (ref 14.0–49.0)
MCH: 26.5 pg — ABNORMAL LOW (ref 27.2–33.4)
MCHC: 32.1 g/dL (ref 32.0–36.0)
MCV: 82.3 fL (ref 79.3–98.0)
MONO#: 0.4 10*3/uL (ref 0.1–0.9)
MONO%: 9 % (ref 0.0–14.0)
NEUT%: 59.7 % (ref 39.0–75.0)
NEUTROS ABS: 2.8 10*3/uL (ref 1.5–6.5)
PLATELETS: 182 10*3/uL (ref 140–400)
RBC: 3.71 10*6/uL — AB (ref 4.20–5.82)
RDW: 14.6 % (ref 11.0–14.6)
WBC: 4.7 10*3/uL (ref 4.0–10.3)

## 2017-10-04 LAB — CEA (IN HOUSE-CHCC): CEA (CHCC-In House): 6.07 ng/mL — ABNORMAL HIGH (ref 0.00–5.00)

## 2017-10-04 LAB — FERRITIN: Ferritin: 196 ng/ml (ref 22–316)

## 2017-10-04 LAB — MAGNESIUM: Magnesium: 2.1 mg/dl (ref 1.5–2.5)

## 2017-10-04 MED ORDER — ATROPINE SULFATE 1 MG/ML IJ SOLN
INTRAMUSCULAR | Status: AC
  Filled 2017-10-04: qty 1

## 2017-10-04 MED ORDER — SODIUM CHLORIDE 0.9 % IV SOLN
Freq: Once | INTRAVENOUS | Status: AC
  Administered 2017-10-04: 13:00:00 via INTRAVENOUS

## 2017-10-04 MED ORDER — PALONOSETRON HCL INJECTION 0.25 MG/5ML
0.2500 mg | Freq: Once | INTRAVENOUS | Status: AC
  Administered 2017-10-04: 0.25 mg via INTRAVENOUS

## 2017-10-04 MED ORDER — DEXAMETHASONE SODIUM PHOSPHATE 10 MG/ML IJ SOLN
10.0000 mg | Freq: Once | INTRAMUSCULAR | Status: AC
  Administered 2017-10-04: 10 mg via INTRAVENOUS

## 2017-10-04 MED ORDER — PALONOSETRON HCL INJECTION 0.25 MG/5ML
INTRAVENOUS | Status: AC
  Filled 2017-10-04: qty 5

## 2017-10-04 MED ORDER — SODIUM CHLORIDE 0.9 % IV SOLN
6.0000 mg/kg | Freq: Once | INTRAVENOUS | Status: AC
  Administered 2017-10-04: 360 mg via INTRAVENOUS
  Filled 2017-10-04: qty 18

## 2017-10-04 MED ORDER — ATROPINE SULFATE 1 MG/ML IJ SOLN
0.5000 mg | Freq: Once | INTRAMUSCULAR | Status: AC | PRN
  Administered 2017-10-04: 0.5 mg via INTRAVENOUS

## 2017-10-04 MED ORDER — SODIUM CHLORIDE 0.9% FLUSH
10.0000 mL | INTRAVENOUS | Status: DC | PRN
  Administered 2017-10-04: 10 mL via INTRAVENOUS
  Filled 2017-10-04: qty 10

## 2017-10-04 MED ORDER — HEPARIN SOD (PORK) LOCK FLUSH 100 UNIT/ML IV SOLN
500.0000 [IU] | Freq: Once | INTRAVENOUS | Status: DC | PRN
  Filled 2017-10-04: qty 5

## 2017-10-04 MED ORDER — SODIUM CHLORIDE 0.9 % IV SOLN
2200.0000 mg/m2 | INTRAVENOUS | Status: DC
  Administered 2017-10-04: 3650 mg via INTRAVENOUS
  Filled 2017-10-04: qty 73

## 2017-10-04 MED ORDER — DEXAMETHASONE SODIUM PHOSPHATE 10 MG/ML IJ SOLN
INTRAMUSCULAR | Status: AC
  Filled 2017-10-04: qty 1

## 2017-10-04 MED ORDER — IRINOTECAN HCL CHEMO INJECTION 100 MG/5ML
150.0000 mg/m2 | Freq: Once | INTRAVENOUS | Status: AC
  Administered 2017-10-04: 240 mg via INTRAVENOUS
  Filled 2017-10-04: qty 12

## 2017-10-04 MED ORDER — SODIUM CHLORIDE 0.9% FLUSH
10.0000 mL | INTRAVENOUS | Status: DC | PRN
  Filled 2017-10-04: qty 10

## 2017-10-04 MED ORDER — DEXTROSE 5 % IV SOLN
400.0000 mg/m2 | Freq: Once | INTRAVENOUS | Status: AC
  Administered 2017-10-04: 660 mg via INTRAVENOUS
  Filled 2017-10-04: qty 33

## 2017-10-04 NOTE — Patient Instructions (Signed)
[name] Discharge Instructions for Patients Receiving Chemotherapy  Today you received the following chemotherapy agents leucovorin/irinotecan/florouracil/vectibix   To help prevent nausea and vomiting after your treatment, we encourage you to take your nausea medication as directed  If you develop nausea and vomiting that is not controlled by your nausea medication, call the clinic.   BELOW ARE SYMPTOMS THAT SHOULD BE REPORTED IMMEDIATELY:  *FEVER GREATER THAN 100.5 F  *CHILLS WITH OR WITHOUT FEVER  NAUSEA AND VOMITING THAT IS NOT CONTROLLED WITH YOUR NAUSEA MEDICATION  *UNUSUAL SHORTNESS OF BREATH  *UNUSUAL BRUISING OR BLEEDING  TENDERNESS IN MOUTH AND THROAT WITH OR WITHOUT PRESENCE OF ULCERS  *URINARY PROBLEMS  *BOWEL PROBLEMS  UNUSUAL RASH Items with * indicate a potential emergency and should be followed up as soon as possible.  Feel free to call the clinic you have any questions or concerns. The clinic phone number is [phone].

## 2017-10-04 NOTE — Telephone Encounter (Signed)
Appointments complete per 10/8 los. Patient to get print out in infusion area.

## 2017-10-05 ENCOUNTER — Telehealth: Payer: Self-pay

## 2017-10-05 NOTE — Telephone Encounter (Signed)
Spoke with patient concerning appointment. Per 10/8 sch message

## 2017-10-06 ENCOUNTER — Encounter: Payer: Self-pay | Admitting: *Deleted

## 2017-10-06 ENCOUNTER — Ambulatory Visit (HOSPITAL_BASED_OUTPATIENT_CLINIC_OR_DEPARTMENT_OTHER): Payer: Medicaid Other

## 2017-10-06 VITALS — BP 101/76 | HR 100 | Temp 98.0°F | Resp 18

## 2017-10-06 DIAGNOSIS — C186 Malignant neoplasm of descending colon: Secondary | ICD-10-CM

## 2017-10-06 DIAGNOSIS — Z452 Encounter for adjustment and management of vascular access device: Secondary | ICD-10-CM | POA: Diagnosis present

## 2017-10-06 DIAGNOSIS — C189 Malignant neoplasm of colon, unspecified: Secondary | ICD-10-CM

## 2017-10-06 MED ORDER — SODIUM CHLORIDE 0.9% FLUSH
10.0000 mL | INTRAVENOUS | Status: DC | PRN
  Administered 2017-10-06: 10 mL
  Filled 2017-10-06: qty 10

## 2017-10-06 MED ORDER — HEPARIN SOD (PORK) LOCK FLUSH 100 UNIT/ML IV SOLN
500.0000 [IU] | Freq: Once | INTRAVENOUS | Status: AC | PRN
  Administered 2017-10-06: 500 [IU]
  Filled 2017-10-06: qty 5

## 2017-10-18 ENCOUNTER — Other Ambulatory Visit (HOSPITAL_BASED_OUTPATIENT_CLINIC_OR_DEPARTMENT_OTHER): Payer: Medicaid Other

## 2017-10-18 ENCOUNTER — Telehealth: Payer: Self-pay | Admitting: Nurse Practitioner

## 2017-10-18 ENCOUNTER — Ambulatory Visit (HOSPITAL_BASED_OUTPATIENT_CLINIC_OR_DEPARTMENT_OTHER): Payer: Medicaid Other

## 2017-10-18 ENCOUNTER — Ambulatory Visit: Payer: Medicaid Other | Admitting: Nutrition

## 2017-10-18 ENCOUNTER — Encounter: Payer: Self-pay | Admitting: Nurse Practitioner

## 2017-10-18 ENCOUNTER — Ambulatory Visit (HOSPITAL_BASED_OUTPATIENT_CLINIC_OR_DEPARTMENT_OTHER): Payer: Medicaid Other | Admitting: Nurse Practitioner

## 2017-10-18 VITALS — BP 108/63 | HR 80 | Temp 98.4°F | Resp 16 | Ht 69.0 in | Wt 130.4 lb

## 2017-10-18 DIAGNOSIS — C186 Malignant neoplasm of descending colon: Secondary | ICD-10-CM

## 2017-10-18 DIAGNOSIS — C786 Secondary malignant neoplasm of retroperitoneum and peritoneum: Secondary | ICD-10-CM | POA: Diagnosis not present

## 2017-10-18 DIAGNOSIS — Z5111 Encounter for antineoplastic chemotherapy: Secondary | ICD-10-CM

## 2017-10-18 DIAGNOSIS — Z5112 Encounter for antineoplastic immunotherapy: Secondary | ICD-10-CM | POA: Diagnosis not present

## 2017-10-18 DIAGNOSIS — E876 Hypokalemia: Secondary | ICD-10-CM

## 2017-10-18 DIAGNOSIS — C189 Malignant neoplasm of colon, unspecified: Secondary | ICD-10-CM

## 2017-10-18 DIAGNOSIS — Z95828 Presence of other vascular implants and grafts: Secondary | ICD-10-CM

## 2017-10-18 DIAGNOSIS — Z86718 Personal history of other venous thrombosis and embolism: Secondary | ICD-10-CM

## 2017-10-18 DIAGNOSIS — D630 Anemia in neoplastic disease: Secondary | ICD-10-CM

## 2017-10-18 DIAGNOSIS — Z452 Encounter for adjustment and management of vascular access device: Secondary | ICD-10-CM | POA: Diagnosis present

## 2017-10-18 DIAGNOSIS — C762 Malignant neoplasm of abdomen: Secondary | ICD-10-CM

## 2017-10-18 LAB — CBC WITH DIFFERENTIAL/PLATELET
BASO%: 0.6 % (ref 0.0–2.0)
Basophils Absolute: 0 10*3/uL (ref 0.0–0.1)
EOS ABS: 0.1 10*3/uL (ref 0.0–0.5)
EOS%: 2 % (ref 0.0–7.0)
HEMATOCRIT: 30.2 % — AB (ref 38.4–49.9)
HGB: 9.5 g/dL — ABNORMAL LOW (ref 13.0–17.1)
LYMPH#: 1.6 10*3/uL (ref 0.9–3.3)
LYMPH%: 32.3 % (ref 14.0–49.0)
MCH: 26 pg — ABNORMAL LOW (ref 27.2–33.4)
MCHC: 31.5 g/dL — AB (ref 32.0–36.0)
MCV: 82.5 fL (ref 79.3–98.0)
MONO#: 0.5 10*3/uL (ref 0.1–0.9)
MONO%: 10.3 % (ref 0.0–14.0)
NEUT#: 2.7 10*3/uL (ref 1.5–6.5)
NEUT%: 54.8 % (ref 39.0–75.0)
PLATELETS: 132 10*3/uL — AB (ref 140–400)
RBC: 3.66 10*6/uL — AB (ref 4.20–5.82)
RDW: 14.7 % — ABNORMAL HIGH (ref 11.0–14.6)
WBC: 4.9 10*3/uL (ref 4.0–10.3)

## 2017-10-18 LAB — COMPREHENSIVE METABOLIC PANEL
ANION GAP: 9 meq/L (ref 3–11)
AST: 18 U/L (ref 5–34)
Albumin: 3.3 g/dL — ABNORMAL LOW (ref 3.5–5.0)
Alkaline Phosphatase: 87 U/L (ref 40–150)
BUN: 7.3 mg/dL (ref 7.0–26.0)
CALCIUM: 8.5 mg/dL (ref 8.4–10.4)
CHLORIDE: 105 meq/L (ref 98–109)
CO2: 23 mEq/L (ref 22–29)
CREATININE: 0.8 mg/dL (ref 0.7–1.3)
GLUCOSE: 83 mg/dL (ref 70–140)
Potassium: 3.3 mEq/L — ABNORMAL LOW (ref 3.5–5.1)
Sodium: 138 mEq/L (ref 136–145)
TOTAL PROTEIN: 7.7 g/dL (ref 6.4–8.3)
Total Bilirubin: 0.22 mg/dL (ref 0.20–1.20)

## 2017-10-18 LAB — MAGNESIUM: MAGNESIUM: 1.7 mg/dL (ref 1.5–2.5)

## 2017-10-18 MED ORDER — POTASSIUM CHLORIDE CRYS ER 20 MEQ PO TBCR: 20.0000 meq | EXTENDED_RELEASE_TABLET | Freq: Every day | ORAL | 1 refills | Status: DC

## 2017-10-18 MED ORDER — ATROPINE SULFATE 1 MG/ML IJ SOLN
0.5000 mg | Freq: Once | INTRAMUSCULAR | Status: AC | PRN
  Administered 2017-10-18: 0.5 mg via INTRAVENOUS

## 2017-10-18 MED ORDER — IRINOTECAN HCL CHEMO INJECTION 100 MG/5ML
180.0000 mg/m2 | Freq: Once | INTRAVENOUS | Status: AC
  Administered 2017-10-18: 300 mg via INTRAVENOUS
  Filled 2017-10-18: qty 15

## 2017-10-18 MED ORDER — SODIUM CHLORIDE 0.9 % IV SOLN
6.0000 mg/kg | Freq: Once | INTRAVENOUS | Status: AC
  Administered 2017-10-18: 360 mg via INTRAVENOUS
  Filled 2017-10-18: qty 18

## 2017-10-18 MED ORDER — SODIUM CHLORIDE 0.9 % IV SOLN
2400.0000 mg/m2 | INTRAVENOUS | Status: DC
  Administered 2017-10-18: 3950 mg via INTRAVENOUS
  Filled 2017-10-18: qty 79

## 2017-10-18 MED ORDER — PALONOSETRON HCL INJECTION 0.25 MG/5ML
INTRAVENOUS | Status: AC
  Filled 2017-10-18: qty 5

## 2017-10-18 MED ORDER — PALONOSETRON HCL INJECTION 0.25 MG/5ML
0.2500 mg | Freq: Once | INTRAVENOUS | Status: AC
  Administered 2017-10-18: 0.25 mg via INTRAVENOUS

## 2017-10-18 MED ORDER — DEXAMETHASONE SODIUM PHOSPHATE 10 MG/ML IJ SOLN
INTRAMUSCULAR | Status: AC
  Filled 2017-10-18: qty 1

## 2017-10-18 MED ORDER — ATROPINE SULFATE 1 MG/ML IJ SOLN
INTRAMUSCULAR | Status: AC
  Filled 2017-10-18: qty 1

## 2017-10-18 MED ORDER — SODIUM CHLORIDE 0.9 % IV SOLN
Freq: Once | INTRAVENOUS | Status: AC
  Administered 2017-10-18: 12:00:00 via INTRAVENOUS

## 2017-10-18 MED ORDER — SODIUM CHLORIDE 0.9% FLUSH
10.0000 mL | INTRAVENOUS | Status: DC | PRN
  Administered 2017-10-18: 10 mL via INTRAVENOUS
  Filled 2017-10-18: qty 10

## 2017-10-18 MED ORDER — DEXAMETHASONE SODIUM PHOSPHATE 10 MG/ML IJ SOLN
10.0000 mg | Freq: Once | INTRAMUSCULAR | Status: AC
  Administered 2017-10-18: 10 mg via INTRAVENOUS

## 2017-10-18 MED ORDER — LEUCOVORIN CALCIUM INJECTION 350 MG
400.0000 mg/m2 | Freq: Once | INTRAVENOUS | Status: AC
  Administered 2017-10-18: 660 mg via INTRAVENOUS
  Filled 2017-10-18: qty 33

## 2017-10-18 MED ORDER — HYDROCORTISONE 2.5 % EX CREA: TOPICAL_CREAM | Freq: Two times a day (BID) | CUTANEOUS | 3 refills | Status: DC

## 2017-10-18 MED ORDER — OXYCODONE HCL 10 MG PO TABS: 10.0000 mg | ORAL_TABLET | Freq: Four times a day (QID) | ORAL | 0 refills | Status: DC | PRN

## 2017-10-18 MED FILL — POTASSIUM CL ER 20 MEQ TAB: 20 | 30 days supply | Qty: 30 | Fill #0

## 2017-10-18 MED FILL — HYDROCORTISONE 2.5% CREAM: 2.5 | 10 days supply | Qty: 30 | Fill #0

## 2017-10-18 NOTE — Patient Instructions (Signed)
Implanted Port Home Guide An implanted port is a type of central line that is placed under the skin. Central lines are used to provide IV access when treatment or nutrition needs to be given through a person's veins. Implanted ports are used for long-term IV access. An implanted port may be placed because:  You need IV medicine that would be irritating to the small veins in your hands or arms.  You need long-term IV medicines, such as antibiotics.  You need IV nutrition for a long period.  You need frequent blood draws for lab tests.  You need dialysis.  Implanted ports are usually placed in the chest area, but they can also be placed in the upper arm, the abdomen, or the leg. An implanted port has two main parts:  Reservoir. The reservoir is round and will appear as a small, raised area under your skin. The reservoir is the part where a needle is inserted to give medicines or draw blood.  Catheter. The catheter is a thin, flexible tube that extends from the reservoir. The catheter is placed into a large vein. Medicine that is inserted into the reservoir goes into the catheter and then into the vein.  How will I care for my incision site? Do not get the incision site wet. Bathe or shower as directed by your health care provider. How is my port accessed? Special steps must be taken to access the port:  Before the port is accessed, a numbing cream can be placed on the skin. This helps numb the skin over the port site.  Your health care provider uses a sterile technique to access the port. ? Your health care provider must put on a mask and sterile gloves. ? The skin over your port is cleaned carefully with an antiseptic and allowed to dry. ? The port is gently pinched between sterile gloves, and a needle is inserted into the port.  Only "non-coring" port needles should be used to access the port. Once the port is accessed, a blood return should be checked. This helps ensure that the port  is in the vein and is not clogged.  If your port needs to remain accessed for a constant infusion, a clear (transparent) bandage will be placed over the needle site. The bandage and needle will need to be changed every week, or as directed by your health care provider.  Keep the bandage covering the needle clean and dry. Do not get it wet. Follow your health care provider's instructions on how to take a shower or bath while the port is accessed.  If your port does not need to stay accessed, no bandage is needed over the port.  What is flushing? Flushing helps keep the port from getting clogged. Follow your health care provider's instructions on how and when to flush the port. Ports are usually flushed with saline solution or a medicine called heparin. The need for flushing will depend on how the port is used.  If the port is used for intermittent medicines or blood draws, the port will need to be flushed: ? After medicines have been given. ? After blood has been drawn. ? As part of routine maintenance.  If a constant infusion is running, the port may not need to be flushed.  How long will my port stay implanted? The port can stay in for as long as your health care provider thinks it is needed. When it is time for the port to come out, surgery will be   done to remove it. The procedure is similar to the one performed when the port was put in. When should I seek immediate medical care? When you have an implanted port, you should seek immediate medical care if:  You notice a bad smell coming from the incision site.  You have swelling, redness, or drainage at the incision site.  You have more swelling or pain at the port site or the surrounding area.  You have a fever that is not controlled with medicine.  This information is not intended to replace advice given to you by your health care provider. Make sure you discuss any questions you have with your health care provider. Document  Released: 12/14/2005 Document Revised: 05/21/2016 Document Reviewed: 08/21/2013 Elsevier Interactive Patient Education  2017 [employer]  

## 2017-10-18 NOTE — Telephone Encounter (Signed)
Scheduled appt per 10/212 los - patient to get an updated schedule next visit.

## 2017-10-18 NOTE — Progress Notes (Signed)
Nutrition follow-up completed with patient receiving chemotherapy for colon cancer. Weight improved documented as 130 pounds on October 22 increased from 124 pounds October 8. Patient denies nutrition impact symptoms. States he is eating well. He will drink ensure and agrees to takes coupons today.  Nutrition diagnosis: Inadequate oral intake improving.  Intervention: Educated patient to drink to Ensure Plus daily along with high-calorie, high-protein foods.  Patient understands his goal is for weight maintenance.  Monitoring, evaluation, goals: Patient will tolerate adequate calories and protein to maintain weight.  Next visit: Monday, November 19, during infusion.  **Disclaimer: This note was dictated with voice recognition software. Similar sounding words can inadvertently be transcribed and this note may contain transcription errors which may not have been corrected upon publication of note.**
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
## 2017-10-18 NOTE — Patient Instructions (Signed)
[facility] Discharge Instructions for Patients Receiving Chemotherapy  Today you received the following chemotherapy agents Irinotecan, Leucovorin, 5FU.   To help prevent nausea and vomiting after your treatment, we encourage you to take your nausea medication as prescribed.   If you develop nausea and vomiting that is not controlled by your nausea medication, call the clinic.   BELOW ARE SYMPTOMS THAT SHOULD BE REPORTED IMMEDIATELY:  *FEVER GREATER THAN 100.5 F  *CHILLS WITH OR WITHOUT FEVER  NAUSEA AND VOMITING THAT IS NOT CONTROLLED WITH YOUR NAUSEA MEDICATION  *UNUSUAL SHORTNESS OF BREATH  *UNUSUAL BRUISING OR BLEEDING  TENDERNESS IN MOUTH AND THROAT WITH OR WITHOUT PRESENCE OF ULCERS  *URINARY PROBLEMS  *BOWEL PROBLEMS  UNUSUAL RASH Items with * indicate a potential emergency and should be followed up as soon as possible.  Feel free to call the clinic should you have any questions or concerns. The clinic phone number is [phone].  Please show the [facility] at check-in to the Emergency Department and triage nurse.

## 2017-10-19 MED FILL — oxyCODONE HCL 10 MG TABS: 10 | 23 days supply | Qty: 90 | Fill #0

## 2017-10-20 ENCOUNTER — Ambulatory Visit (HOSPITAL_BASED_OUTPATIENT_CLINIC_OR_DEPARTMENT_OTHER): Payer: Medicaid Other

## 2017-10-20 VITALS — BP 108/76 | HR 80 | Temp 98.4°F | Resp 18

## 2017-10-20 DIAGNOSIS — Z452 Encounter for adjustment and management of vascular access device: Secondary | ICD-10-CM | POA: Diagnosis present

## 2017-10-20 DIAGNOSIS — C186 Malignant neoplasm of descending colon: Secondary | ICD-10-CM | POA: Diagnosis not present

## 2017-10-20 DIAGNOSIS — C189 Malignant neoplasm of colon, unspecified: Secondary | ICD-10-CM

## 2017-10-20 MED ORDER — HEPARIN SOD (PORK) LOCK FLUSH 100 UNIT/ML IV SOLN
500.0000 [IU] | Freq: Once | INTRAVENOUS | Status: AC | PRN
  Administered 2017-10-20: 500 [IU]
  Filled 2017-10-20: qty 5

## 2017-10-20 MED ORDER — SODIUM CHLORIDE 0.9% FLUSH
10.0000 mL | INTRAVENOUS | Status: DC | PRN
  Administered 2017-10-20: 10 mL
  Filled 2017-10-20: qty 10

## 2017-10-27 NOTE — Progress Notes (Signed)
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 significantly benefit from EGFR inhibitor. He has started on panitumumab and has been tolerating well. -I previously reviewed the CT scan from 04/09/17 with the patient in detail. He has had excellent partial response to the chemotherapy treatment,  no other new lesions, we'll continue current chemotherapy regimen. -I reviewed his restaging CT scan from 07/05/2017, which showed a continuous response, no new lesions. We'll continue current treatment. -He previously developed a moderately thrombocytopenia, chemotherapy dose was reduced  --Due to Pancreatic Abscess,  His recent hospitalization, chemotherapy has been held. - he has  Being recovering well from his recent hospitalization, still in rehabilitation, but ambulates  Without any difficulty, he is also eating better. - due to his recent thrombosis and GI bleeding, he is not a candidate for Avastin for now. Given questionable disease progression, I recommend him to continue panitumumab for now - due to his recent bowel obstruction, I think he probably has had disease progression, although scan showed  stable disease . I recommend to switch his chemotherapy to irinotecan alone, or FOLFIRI if he is able to tolerate.  - the goal of therapy is palliative. - we again discussed the incurable nature of his cancer, an overall poor prognosis. The goal of therapy is palliative. -Started irenotecan and panitumumab on 09/21/17. Tolerated well with increased ostomy output. Added 5-fu back with cycle 2 on 10/04/17 and reduced FOLFIRI due to overall limited PS. Increased to full dose FOLFIRI on 10/18/17, he tolerated well overall with mild to moderate diarrhea. -encouraged him to manage his diarrhea with imodium, I ordered lomotil for him today -Will repeat CT scan in 3-4 weeks -Labs reviewed ans adequate to proceed with chemo today.   -F/u on 12/17 -I offered him the flu shot today and he declined. I encouraged him to stay away from those who may be sick.      2. History of right LE DVT -I previously reviewed his ultrasound results from 02/17/17, which showed DVT involving the femoral, popliteal, and peritoneal vein of the right lower extremity from 2/22. -He had history of right lower extremity DVT after surgery before  - his Xarelto has been held due to GI bleeding he has a IVC placed  3. 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 -He has received IV Feraheme twice, but his anemia has not improved. He likely has component of anemia of chronic disease secondary to his underlying malignancy. -Ferritin 196 on 10/04/17, WNL, Hgb 9.8; will continue to monitor  -Persistent anemia, Hg slightly improved to 10.5 (11/01/17). Continue ferrous Sulfate   5. Weight loss, malnutrition, anorexia -Secondary to chemotherapy and cancer -The patient will continue to drink dietary supplements. -He has gained some weight  lately - I again strongly encouraged him to try to eat more to avoid losing excess weight. I again recommended he drink 2 ensure or boost supplements a day to help. -I prescribed mirtazapine on 05/25/17 in attempts to help his appetite. He knows to take it once every evening. - continue follow-up with dietitian, previous f/u 10/22 -His weight continues to fluctuate  6. Pancreatic Abscess -Presents to hospital on 07/09/17 for abdominal pain. Based on SP CT from 07/10/17 he found to have an abscess. Draining tube placed and he received prolonged course of antibiotics  - His percutaneous drainage tube has been removed  -this is likely resolved now   7. Bowel obstruction due to his colon mass at the splenic flexure and surrounding inflammation due to recent abscess, s/u diverting colostomy  -He was hospitalized on 07/29/17 for  SBO  -He had sigmoidoscopy and loop Colostomy and has a J tube placed.  - he is tolerating diet well now. -Bowel movements overall have been normal, but sometimes he has softer stools and are more loose.  -I again reviewed the usage of both imodium and colace and in what context he should be using them both for his stool consistencies.  -He has colostomy prolapse, he knows to avoid lifting and carry heavy things -He will see his surgeon early 10/2017 -Diarrhea controlled  -He has loose to watery stool which improves a few days after chemo. I will prescribe lomotil today (11/01/17). He should take imodium first and if not enough he should take lomotil to control his diarrhea.   8. Goal of care discussion  -We again discussed the incurable nature of his cancer, and the overall poor prognosis, especially if he does not have good response to chemotherapy or progress on chemo -The patient understands the goal of care is palliative. -I have recommended DNR/DNI, he will think about it.  9. Hypokalemia  -10/18/17 Cmet indicates hypokalemia 3.3, decreased from 3.4 at last visit.  -I  prescribed oral K 20 mEq 1 tablet daily. -potassium increased to 3.4 today (11/01/17). Will continue potassium supplement for now.   Plan: -Prescribe lomotil today  -CT CTA with contrast in 3-4 weeks -Lab, flush, f/u and FOLFIRI and panitumumab in 2 weeks     All questions were answered. The patient knows to call the clinic with any problems, questions or concerns.  I spent 25 minutes counseling the patient face to face. The total time spent in the appointment was  30 minutes and more than 50% was on counseling.  breast feeding    [name], MD 11/01/2017   This document serves as a record of services personally performed by [name], MD. It was created on her behalf by Joslyn Devon, a trained medical scribe. The creation of this record is based on the scribe's personal observations and the provider's statements to them.   I have reviewed the above documentation for accuracy and completeness, and I agree with the above.

## 2017-10-28 MED FILL — NYAMYC 100,000 UNITS/GM PWD: 100000 | 7 days supply | Qty: 15 | Fill #0

## 2017-11-01 ENCOUNTER — Ambulatory Visit (HOSPITAL_BASED_OUTPATIENT_CLINIC_OR_DEPARTMENT_OTHER): Payer: Medicaid Other

## 2017-11-01 ENCOUNTER — Other Ambulatory Visit (HOSPITAL_BASED_OUTPATIENT_CLINIC_OR_DEPARTMENT_OTHER): Payer: Medicaid Other

## 2017-11-01 ENCOUNTER — Encounter: Payer: Self-pay | Admitting: Hematology

## 2017-11-01 ENCOUNTER — Ambulatory Visit (HOSPITAL_BASED_OUTPATIENT_CLINIC_OR_DEPARTMENT_OTHER): Payer: Medicaid Other | Admitting: Hematology

## 2017-11-01 VITALS — BP 116/85 | HR 89 | Temp 97.8°F | Resp 18 | Ht 69.0 in | Wt 124.3 lb

## 2017-11-01 DIAGNOSIS — Z86718 Personal history of other venous thrombosis and embolism: Secondary | ICD-10-CM

## 2017-11-01 DIAGNOSIS — C762 Malignant neoplasm of abdomen: Secondary | ICD-10-CM

## 2017-11-01 DIAGNOSIS — Z452 Encounter for adjustment and management of vascular access device: Secondary | ICD-10-CM

## 2017-11-01 DIAGNOSIS — E876 Hypokalemia: Secondary | ICD-10-CM | POA: Diagnosis not present

## 2017-11-01 DIAGNOSIS — C786 Secondary malignant neoplasm of retroperitoneum and peritoneum: Secondary | ICD-10-CM

## 2017-11-01 DIAGNOSIS — C189 Malignant neoplasm of colon, unspecified: Secondary | ICD-10-CM

## 2017-11-01 DIAGNOSIS — Z5112 Encounter for antineoplastic immunotherapy: Secondary | ICD-10-CM

## 2017-11-01 DIAGNOSIS — D630 Anemia in neoplastic disease: Secondary | ICD-10-CM

## 2017-11-01 DIAGNOSIS — Z5111 Encounter for antineoplastic chemotherapy: Secondary | ICD-10-CM | POA: Diagnosis not present

## 2017-11-01 DIAGNOSIS — C186 Malignant neoplasm of descending colon: Secondary | ICD-10-CM

## 2017-11-01 DIAGNOSIS — D509 Iron deficiency anemia, unspecified: Secondary | ICD-10-CM

## 2017-11-01 DIAGNOSIS — Z95828 Presence of other vascular implants and grafts: Secondary | ICD-10-CM

## 2017-11-01 LAB — CBC WITH DIFFERENTIAL/PLATELET
BASO%: 0.8 % (ref 0.0–2.0)
BASOS ABS: 0 10*3/uL (ref 0.0–0.1)
EOS ABS: 0.1 10*3/uL (ref 0.0–0.5)
EOS%: 2.1 % (ref 0.0–7.0)
HEMATOCRIT: 32.7 % — AB (ref 38.4–49.9)
HGB: 10.5 g/dL — ABNORMAL LOW (ref 13.0–17.1)
LYMPH#: 1.4 10*3/uL (ref 0.9–3.3)
LYMPH%: 30.1 % (ref 14.0–49.0)
MCH: 25.9 pg — AB (ref 27.2–33.4)
MCHC: 32.1 g/dL (ref 32.0–36.0)
MCV: 80.9 fL (ref 79.3–98.0)
MONO#: 0.5 10*3/uL (ref 0.1–0.9)
MONO%: 10.6 % (ref 0.0–14.0)
NEUT#: 2.6 10*3/uL (ref 1.5–6.5)
NEUT%: 56.4 % (ref 39.0–75.0)
PLATELETS: 141 10*3/uL (ref 140–400)
RBC: 4.05 10*6/uL — AB (ref 4.20–5.82)
RDW: 16.1 % — ABNORMAL HIGH (ref 11.0–14.6)
WBC: 4.6 10*3/uL (ref 4.0–10.3)

## 2017-11-01 LAB — COMPREHENSIVE METABOLIC PANEL
ALBUMIN: 3.8 g/dL (ref 3.5–5.0)
ALK PHOS: 86 U/L (ref 40–150)
ALT: 11 U/L (ref 0–55)
AST: 23 U/L (ref 5–34)
Anion Gap: 8 mEq/L (ref 3–11)
BUN: 7.2 mg/dL (ref 7.0–26.0)
CHLORIDE: 104 meq/L (ref 98–109)
CO2: 25 meq/L (ref 22–29)
Calcium: 9.3 mg/dL (ref 8.4–10.4)
Creatinine: 0.8 mg/dL (ref 0.7–1.3)
GLUCOSE: 90 mg/dL (ref 70–140)
POTASSIUM: 3.4 meq/L — AB (ref 3.5–5.1)
SODIUM: 136 meq/L (ref 136–145)
Total Bilirubin: 0.34 mg/dL (ref 0.20–1.20)
Total Protein: 8.4 g/dL — ABNORMAL HIGH (ref 6.4–8.3)

## 2017-11-01 LAB — FERRITIN: FERRITIN: 163 ng/mL (ref 22–316)

## 2017-11-01 LAB — MAGNESIUM: Magnesium: 2 mg/dl (ref 1.5–2.5)

## 2017-11-01 LAB — CEA (IN HOUSE-CHCC): CEA (CHCC-In House): 7.97 ng/mL — ABNORMAL HIGH (ref 0.00–5.00)

## 2017-11-01 MED ORDER — DIPHENOXYLATE-ATROPINE 2.5-0.025 MG PO TABS: 1.0000 | ORAL_TABLET | Freq: Four times a day (QID) | ORAL | 2 refills | Status: DC | PRN

## 2017-11-01 MED ORDER — PALONOSETRON HCL INJECTION 0.25 MG/5ML
INTRAVENOUS | Status: AC
  Filled 2017-11-01: qty 5

## 2017-11-01 MED ORDER — LEUCOVORIN CALCIUM INJECTION 350 MG
400.0000 mg/m2 | Freq: Once | INTRAVENOUS | Status: AC
  Administered 2017-11-01: 660 mg via INTRAVENOUS
  Filled 2017-11-01: qty 33

## 2017-11-01 MED ORDER — SODIUM CHLORIDE 0.9 % IV SOLN
6.5000 mg/kg | Freq: Once | INTRAVENOUS | Status: AC
  Administered 2017-11-01: 400 mg via INTRAVENOUS
  Filled 2017-11-01: qty 20

## 2017-11-01 MED ORDER — ATROPINE SULFATE 1 MG/ML IJ SOLN
INTRAMUSCULAR | Status: AC
  Filled 2017-11-01: qty 1

## 2017-11-01 MED ORDER — ATROPINE SULFATE 1 MG/ML IJ SOLN
0.5000 mg | Freq: Once | INTRAMUSCULAR | Status: AC | PRN
  Administered 2017-11-01: 0.5 mg via INTRAVENOUS

## 2017-11-01 MED ORDER — SODIUM CHLORIDE 0.9 % IV SOLN
Freq: Once | INTRAVENOUS | Status: AC
  Administered 2017-11-01: 10:00:00 via INTRAVENOUS

## 2017-11-01 MED ORDER — IRINOTECAN HCL CHEMO INJECTION 100 MG/5ML
180.0000 mg/m2 | Freq: Once | INTRAVENOUS | Status: AC
  Administered 2017-11-01: 300 mg via INTRAVENOUS
  Filled 2017-11-01: qty 15

## 2017-11-01 MED ORDER — SODIUM CHLORIDE 0.9% FLUSH
10.0000 mL | INTRAVENOUS | Status: AC | PRN
  Administered 2017-11-01: 10 mL via INTRAVENOUS
  Filled 2017-11-01: qty 10

## 2017-11-01 MED ORDER — DEXAMETHASONE SODIUM PHOSPHATE 10 MG/ML IJ SOLN
INTRAMUSCULAR | Status: AC
  Filled 2017-11-01: qty 1

## 2017-11-01 MED ORDER — FLUOROURACIL CHEMO INJECTION 5 GM/100ML
2400.0000 mg/m2 | INTRAVENOUS | Status: DC
  Administered 2017-11-01: 3950 mg via INTRAVENOUS
  Filled 2017-11-01: qty 79

## 2017-11-01 MED ORDER — DEXAMETHASONE SODIUM PHOSPHATE 10 MG/ML IJ SOLN
10.0000 mg | Freq: Once | INTRAMUSCULAR | Status: AC
  Administered 2017-11-01: 10 mg via INTRAVENOUS

## 2017-11-01 MED ORDER — PALONOSETRON HCL INJECTION 0.25 MG/5ML
0.2500 mg | Freq: Once | INTRAVENOUS | Status: AC
  Administered 2017-11-01: 0.25 mg via INTRAVENOUS

## 2017-11-01 MED FILL — DIPHENOXYLATE/ATROPINE TAB: 2.5-0.025 | 15 days supply | Qty: 60 | Fill #0

## 2017-11-01 NOTE — Patient Instructions (Signed)
[facility] Discharge Instructions for Patients Receiving Chemotherapy  Today you received the following chemotherapy agents leucovorin/irinotecan/florouracil/vectibix   To help prevent nausea and vomiting after your treatment, we encourage you to take your nausea medication as directed  If you develop nausea and vomiting that is not controlled by your nausea medication, call the clinic.   BELOW ARE SYMPTOMS THAT SHOULD BE REPORTED IMMEDIATELY:  *FEVER GREATER THAN 100.5 F  *CHILLS WITH OR WITHOUT FEVER  NAUSEA AND VOMITING THAT IS NOT CONTROLLED WITH YOUR NAUSEA MEDICATION  *UNUSUAL SHORTNESS OF BREATH  *UNUSUAL BRUISING OR BLEEDING  TENDERNESS IN MOUTH AND THROAT WITH OR WITHOUT PRESENCE OF ULCERS  *URINARY PROBLEMS  *BOWEL PROBLEMS  UNUSUAL RASH Items with * indicate a potential emergency and should be followed up as soon as possible.  Feel free to call the clinic you have any questions or concerns. The clinic phone number is [phone].

## 2017-11-03 ENCOUNTER — Ambulatory Visit (HOSPITAL_BASED_OUTPATIENT_CLINIC_OR_DEPARTMENT_OTHER): Payer: Medicaid Other

## 2017-11-03 VITALS — BP 112/76 | HR 95 | Temp 98.1°F | Resp 18

## 2017-11-03 DIAGNOSIS — Z452 Encounter for adjustment and management of vascular access device: Secondary | ICD-10-CM

## 2017-11-03 DIAGNOSIS — C186 Malignant neoplasm of descending colon: Secondary | ICD-10-CM | POA: Diagnosis not present

## 2017-11-03 DIAGNOSIS — C189 Malignant neoplasm of colon, unspecified: Secondary | ICD-10-CM

## 2017-11-03 MED ORDER — HEPARIN SOD (PORK) LOCK FLUSH 100 UNIT/ML IV SOLN
500.0000 [IU] | Freq: Once | INTRAVENOUS | Status: AC | PRN
  Administered 2017-11-03: 500 [IU]
  Filled 2017-11-03: qty 5

## 2017-11-03 MED ORDER — SODIUM CHLORIDE 0.9% FLUSH
10.0000 mL | INTRAVENOUS | Status: DC | PRN
  Administered 2017-11-03: 10 mL
  Filled 2017-11-03: qty 10

## 2017-11-04 ENCOUNTER — Telehealth: Payer: Self-pay | Admitting: *Deleted

## 2017-11-04 ENCOUNTER — Telehealth: Payer: Self-pay | Admitting: Hematology

## 2017-11-04 NOTE — Telephone Encounter (Signed)
Appointments complete per 11/5 los. Patient to get new schedule at 11/19 visit.

## 2017-11-04 NOTE — Telephone Encounter (Signed)
Received call from daughter Casimer Bilis requesting a call back from nurse to either her or pt.  Spoke with pt, and was informed that pt had started Lomotil since after visit on 11/01/17.  Stated today, stools have started to slow down.  Instructed pt to call office early am if pt continues to have watery stools.  Pt voiced understanding. Pt's   Phone     [phone].

## 2017-11-12 NOTE — Progress Notes (Signed)
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 inhibitor. He has started on panitumumab and has been tolerating well. -I previously reviewed the CT scan from 04/09/17 with the patient in detail. He has had excellent partial response to the chemotherapy treatment, no other new lesions, we'll continue  current chemotherapy regimen. -I reviewed his restaging CT scan from 07/05/2017, which showed a continuous response, no new lesions. We'll continue current treatment. -He previously developed a moderately thrombocytopenia, chemotherapy dose was reduced  --Due to Pancreatic Abscess,  His recent hospitalization, chemotherapy has been held. - he has  Being recovering well from his recent hospitalization, still in rehabilitation, but ambulates  Without any difficulty, he is also eating better. - due to his recent thrombosis and GI bleeding, he is not a candidate for Avastin for now. Given questionable disease progression, I recommend him to continue panitumumab for now - due to his recent bowel obstruction, I think he probably has had disease progression, although scan showed  stable disease . I recommend to switch his chemotherapy to irinotecan alone, or FOLFIRI if he is able to tolerate.  - the goal of therapy is palliative. - we again discussed the incurable nature of his cancer, an overall poor prognosis. The goal of therapy is palliative. -Started irinotecan and panitumumab on 09/21/17. Tolerated well with increased ostomy output. Added 5-fu back with cycle 2 on 10/04/17 and reduced FOLFIRI due to overall limited PS. Increased to full dose FOLFIRI on 10/18/17, he tolerated well overall with mild to moderate diarrhea. -encouraged him to manage his diarrhea with imodium and lomotil  -Due to his poor toleration of full dose FOLFIRI with diarrhea and emesis, I will reduce irinotecan back to 114m/m2. He has just recovered last cycle chemotherapy, [place] I will reduce his chemo dose further today.   -I suggest he increase his Lomotil to up to 6-8 tablets daily if he experiences significant diarrhea again.   -Next  CT scan 11/29 -Labs reviewed and adequate to proceed with lower dose chemo today.   -F/u in 2 weeks with Lacie     2. History of right LE DVT -I previously reviewed his ultrasound results from 02/17/17, which showed DVT involving the femoral, popliteal, and peritoneal vein of the right lower extremity from 2/22. -He had history of right lower extremity DVT after surgery before  - his Xarelto has been held due to GI bleeding he has a IVC placed -No recurrence of bleeding since IVC placement.   3. 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 -- continue oxycodone as needed, pain controlled  -Has also developed some back pain in the past few weeks, related to chemo.  Using oxycodone for the pain. -I called in Neurontin 1028mfor his shooting leg pain (11/15/17). He can start low dose with 1 tab at night and increase up to 3 tablets in the evening if he can tolerate it.   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 -He has received IV Feraheme twice, but his anemia has not improved. He likely has component of anemia of chronic disease secondary to his underlying malignancy. -Ferritin 196 on 10/04/17, WNL, Hgb 9.8; will continue to monitor  -Persistent anemia, Hg dropped to 9/6 (11/15/17). Continue ferrous Sulfate, no need for blood transfusion today.   5. Weight loss, malnutrition, anorexia -Secondary to chemotherapy and cancer -The patient will continue to drink dietary supplements. -He has gained some weight lately - I again strongly encouraged him to try to eat more to avoid losing excess weight. I again recommended he drink 2 ensure or boost supplements a day to  help. -I prescribed mirtazapine on 05/25/17 in attempts to help his appetite. He knows to take it once every evening. - continue follow-up with dietitian, previous f/u 10/22 -His weight continues to fluctuate  6. Pancreatic Abscess -Presents to hospital on 07/09/17 for abdominal pain. Based on SP CT from 07/10/17 he found to have an abscess. Draining tube placed and he received prolonged course of antibiotics  - His percutaneous drainage tube has been removed  -this has resolved   7. Bowel obstruction due to his colon mass at the splenic flexure and surrounding inflammation due to recent abscess, s/u diverting colostomy  -He was hospitalized on 07/29/17 for SBO  -He had sigmoidoscopy and loop Colostomy and has a J tube placed.  - he is tolerating diet well now. -Bowel movements overall have been normal, but sometimes he has softer stools and are more loose.  -I again reviewed the usage of both imodium and colace and in what context he should be using them both for his stool consistencies.  -He has colostomy prolapse, he knows to avoid lifting and carry heavy things -He will see his surgeon early  10/2017 -Diarrhea controlled  -He has loose to watery stool which improves a few days after chemo. I prescribed lomotil on 11/01/17. He should take imodium first and if not enough he should take lomotil to control his diarrhea.  -I suggest he increase his Lomotil to up to 6-8 tablets daily if he experiences significant diarrhea again  8. Goal of care discussion  -We again discussed the incurable nature of his cancer, and the overall poor prognosis, especially if he does not have good response to chemotherapy or progress on chemo -The patient understands the goal of care is palliative. -I have recommended DNR/DNI, he will think about it.  9. Hypokalemia  -10/18/17 Cmet indicates hypokalemia 3.3, decreased from 3.4 at last visit.  -I prescribed oral K 20 mEq 1 tablet daily. -potassium increased to 3.6 today (11/15/17). Will continue potassium supplement for now.    Plan: -Labs reviewed and adequate to proceed with cycle 5 FOLFIRI today, with dose reduction.   -Prescribe Neurontin 100 mg, start with 1 tab at night until next visit and if he can tolerate it he can increase up to 3 tabs in the evening.  -Refill Oxycodone today  -Fill out handicap sticker for pt  -Continue potassium pill and lomotil for diarrhea  -Lab and F/u in 2 weeks with Lacie   All questions were answered. The patient knows to call the clinic with any problems, questions or concerns.  I spent 25 minutes counseling the patient face to face. The total time spent in the appointment was  30 minutes and more than 50% was on counseling.    [name], MD 11/15/2017   This document serves as a record of services personally performed by [name], MD. It was created on her behalf by Joslyn Devon, a trained medical scribe. The creation of this record is based on the scribe's personal observations and the provider's statements to them.    I have reviewed the above documentation for accuracy and completeness, and I agree with  the above.

## 2017-11-15 ENCOUNTER — Other Ambulatory Visit (HOSPITAL_BASED_OUTPATIENT_CLINIC_OR_DEPARTMENT_OTHER): Payer: Medicaid Other

## 2017-11-15 ENCOUNTER — Encounter: Payer: Self-pay | Admitting: Hematology

## 2017-11-15 ENCOUNTER — Ambulatory Visit (HOSPITAL_BASED_OUTPATIENT_CLINIC_OR_DEPARTMENT_OTHER): Payer: Medicaid Other

## 2017-11-15 ENCOUNTER — Ambulatory Visit: Payer: Medicaid Other | Admitting: Nutrition

## 2017-11-15 ENCOUNTER — Ambulatory Visit (HOSPITAL_BASED_OUTPATIENT_CLINIC_OR_DEPARTMENT_OTHER): Payer: Medicaid Other | Admitting: Hematology

## 2017-11-15 VITALS — BP 104/72 | HR 89 | Temp 98.9°F | Resp 19 | Ht 69.0 in | Wt 128.6 lb

## 2017-11-15 DIAGNOSIS — Z86718 Personal history of other venous thrombosis and embolism: Secondary | ICD-10-CM | POA: Diagnosis not present

## 2017-11-15 DIAGNOSIS — C185 Malignant neoplasm of splenic flexure: Secondary | ICD-10-CM

## 2017-11-15 DIAGNOSIS — C186 Malignant neoplasm of descending colon: Secondary | ICD-10-CM

## 2017-11-15 DIAGNOSIS — E876 Hypokalemia: Secondary | ICD-10-CM

## 2017-11-15 DIAGNOSIS — D630 Anemia in neoplastic disease: Secondary | ICD-10-CM | POA: Diagnosis not present

## 2017-11-15 DIAGNOSIS — Z5112 Encounter for antineoplastic immunotherapy: Secondary | ICD-10-CM

## 2017-11-15 DIAGNOSIS — C786 Secondary malignant neoplasm of retroperitoneum and peritoneum: Secondary | ICD-10-CM | POA: Diagnosis not present

## 2017-11-15 DIAGNOSIS — Z5111 Encounter for antineoplastic chemotherapy: Secondary | ICD-10-CM | POA: Diagnosis not present

## 2017-11-15 DIAGNOSIS — Z452 Encounter for adjustment and management of vascular access device: Secondary | ICD-10-CM

## 2017-11-15 DIAGNOSIS — Z95828 Presence of other vascular implants and grafts: Secondary | ICD-10-CM

## 2017-11-15 DIAGNOSIS — D509 Iron deficiency anemia, unspecified: Secondary | ICD-10-CM

## 2017-11-15 DIAGNOSIS — C189 Malignant neoplasm of colon, unspecified: Secondary | ICD-10-CM

## 2017-11-15 DIAGNOSIS — C762 Malignant neoplasm of abdomen: Secondary | ICD-10-CM

## 2017-11-15 LAB — CBC WITH DIFFERENTIAL/PLATELET
BASO%: 0.7 % (ref 0.0–2.0)
BASOS ABS: 0 10*3/uL (ref 0.0–0.1)
EOS%: 1.9 % (ref 0.0–7.0)
Eosinophils Absolute: 0.1 10*3/uL (ref 0.0–0.5)
HCT: 31 % — ABNORMAL LOW (ref 38.4–49.9)
HGB: 9.6 g/dL — ABNORMAL LOW (ref 13.0–17.1)
LYMPH%: 32.3 % (ref 14.0–49.0)
MCH: 25.8 pg — AB (ref 27.2–33.4)
MCHC: 31 g/dL — AB (ref 32.0–36.0)
MCV: 83.3 fL (ref 79.3–98.0)
MONO#: 0.4 10*3/uL (ref 0.1–0.9)
MONO%: 8.4 % (ref 0.0–14.0)
NEUT#: 2.5 10*3/uL (ref 1.5–6.5)
NEUT%: 56.7 % (ref 39.0–75.0)
Platelets: 113 10*3/uL — ABNORMAL LOW (ref 140–400)
RBC: 3.72 10*6/uL — ABNORMAL LOW (ref 4.20–5.82)
RDW: 16.4 % — ABNORMAL HIGH (ref 11.0–14.6)
WBC: 4.3 10*3/uL (ref 4.0–10.3)
lymph#: 1.4 10*3/uL (ref 0.9–3.3)

## 2017-11-15 LAB — COMPREHENSIVE METABOLIC PANEL
ANION GAP: 8 meq/L (ref 3–11)
AST: 17 U/L (ref 5–34)
Albumin: 3.2 g/dL — ABNORMAL LOW (ref 3.5–5.0)
Alkaline Phosphatase: 109 U/L (ref 40–150)
BILIRUBIN TOTAL: 0.24 mg/dL (ref 0.20–1.20)
BUN: 10.3 mg/dL (ref 7.0–26.0)
CO2: 24 meq/L (ref 22–29)
CREATININE: 0.8 mg/dL (ref 0.7–1.3)
Calcium: 8.6 mg/dL (ref 8.4–10.4)
Chloride: 108 mEq/L (ref 98–109)
EGFR: >60 mL/min/{1.73_m2} (ref >60)
GLUCOSE: 100 mg/dL (ref 70–140)
Potassium: 3.6 mEq/L (ref 3.5–5.1)
SODIUM: 140 meq/L (ref 136–145)
TOTAL PROTEIN: 7.3 g/dL (ref 6.4–8.3)

## 2017-11-15 LAB — MAGNESIUM: MAGNESIUM: 1.6 mg/dL (ref 1.5–2.5)

## 2017-11-15 MED ORDER — SODIUM CHLORIDE 0.9 % IV SOLN
Freq: Once | INTRAVENOUS | Status: AC
  Administered 2017-11-15: 10:00:00 via INTRAVENOUS

## 2017-11-15 MED ORDER — PALONOSETRON HCL INJECTION 0.25 MG/5ML
INTRAVENOUS | Status: AC
  Filled 2017-11-15: qty 5

## 2017-11-15 MED ORDER — OXYCODONE HCL 10 MG PO TABS: 10.0000 mg | ORAL_TABLET | Freq: Four times a day (QID) | ORAL | 0 refills | Status: DC | PRN

## 2017-11-15 MED ORDER — DEXAMETHASONE SODIUM PHOSPHATE 10 MG/ML IJ SOLN
INTRAMUSCULAR | Status: AC
  Filled 2017-11-15: qty 1

## 2017-11-15 MED ORDER — ATROPINE SULFATE 1 MG/ML IJ SOLN
0.5000 mg | Freq: Once | INTRAMUSCULAR | Status: AC | PRN
  Administered 2017-11-15: 0.5 mg via INTRAVENOUS

## 2017-11-15 MED ORDER — LEUCOVORIN CALCIUM INJECTION 350 MG
400.0000 mg/m2 | Freq: Once | INTRAVENOUS | Status: AC
  Administered 2017-11-15: 660 mg via INTRAVENOUS
  Filled 2017-11-15: qty 33

## 2017-11-15 MED ORDER — ATROPINE SULFATE 1 MG/ML IJ SOLN
INTRAMUSCULAR | Status: AC
  Filled 2017-11-15: qty 1

## 2017-11-15 MED ORDER — PALONOSETRON HCL INJECTION 0.25 MG/5ML
0.2500 mg | Freq: Once | INTRAVENOUS | Status: AC
  Administered 2017-11-15: 0.25 mg via INTRAVENOUS

## 2017-11-15 MED ORDER — SODIUM CHLORIDE 0.9 % IV SOLN
6.5000 mg/kg | Freq: Once | INTRAVENOUS | Status: AC
  Administered 2017-11-15: 400 mg via INTRAVENOUS
  Filled 2017-11-15: qty 20

## 2017-11-15 MED ORDER — IRINOTECAN HCL CHEMO INJECTION 100 MG/5ML
140.0000 mg/m2 | Freq: Once | INTRAVENOUS | Status: AC
  Administered 2017-11-15: 240 mg via INTRAVENOUS
  Filled 2017-11-15: qty 12

## 2017-11-15 MED ORDER — GABAPENTIN 100 MG PO CAPS: 100.0000 mg | ORAL_CAPSULE | Freq: Three times a day (TID) | ORAL | 0 refills | Status: DC

## 2017-11-15 MED ORDER — SODIUM CHLORIDE 0.9% FLUSH
10.0000 mL | INTRAVENOUS | Status: AC | PRN
  Administered 2017-11-15: 10 mL via INTRAVENOUS
  Filled 2017-11-15: qty 10

## 2017-11-15 MED ORDER — SODIUM CHLORIDE 0.9 % IV SOLN
2200.0000 mg/m2 | INTRAVENOUS | Status: DC
  Administered 2017-11-15: 3650 mg via INTRAVENOUS
  Filled 2017-11-15: qty 73

## 2017-11-15 MED ORDER — DEXAMETHASONE SODIUM PHOSPHATE 10 MG/ML IJ SOLN
10.0000 mg | Freq: Once | INTRAMUSCULAR | Status: AC
  Administered 2017-11-15: 10 mg via INTRAVENOUS

## 2017-11-15 MED FILL — GABAPENTIN 100 MG CAPS: 100 | 30 days supply | Qty: 90 | Fill #0

## 2017-11-15 MED FILL — oxyCODONE HCL 10 MG TABS: 10 | 23 days supply | Qty: 90 | Fill #0

## 2017-11-15 NOTE — Patient Instructions (Signed)
[facility] Discharge Instructions for Patients Receiving Chemotherapy  Today you received the following chemotherapy agents: Leucovorin, Irinotecan, & (5FU) Florouracil  To help prevent nausea and vomiting after your treatment, we encourage you to take your nausea medication as directed  If you develop nausea and vomiting that is not controlled by your nausea medication, call the clinic.   BELOW ARE SYMPTOMS THAT SHOULD BE REPORTED IMMEDIATELY:  *FEVER GREATER THAN 100.5 F  *CHILLS WITH OR WITHOUT FEVER  NAUSEA AND VOMITING THAT IS NOT CONTROLLED WITH YOUR NAUSEA MEDICATION  *UNUSUAL SHORTNESS OF BREATH  *UNUSUAL BRUISING OR BLEEDING  TENDERNESS IN MOUTH AND THROAT WITH OR WITHOUT PRESENCE OF ULCERS  *URINARY PROBLEMS  *BOWEL PROBLEMS  UNUSUAL RASH Items with * indicate a potential emergency and should be followed up as soon as possible.  Feel free to call the clinic you have any questions or concerns. The clinic phone number is [phone].

## 2017-11-15 NOTE — Patient Instructions (Signed)
Implanted Port Home Guide An implanted port is a type of central line that is placed under the skin. Central lines are used to provide IV access when treatment or nutrition needs to be given through a person's veins. Implanted ports are used for long-term IV access. An implanted port may be placed because:  You need IV medicine that would be irritating to the small veins in your hands or arms.  You need long-term IV medicines, such as antibiotics.  You need IV nutrition for a long period.  You need frequent blood draws for lab tests.  You need dialysis.  Implanted ports are usually placed in the chest area, but they can also be placed in the upper arm, the abdomen, or the leg. An implanted port has two main parts:  Reservoir. The reservoir is round and will appear as a small, raised area under your skin. The reservoir is the part where a needle is inserted to give medicines or draw blood.  Catheter. The catheter is a thin, flexible tube that extends from the reservoir. The catheter is placed into a large vein. Medicine that is inserted into the reservoir goes into the catheter and then into the vein.  How will I care for my incision site? Do not get the incision site wet. Bathe or shower as directed by your health care provider. How is my port accessed? Special steps must be taken to access the port:  Before the port is accessed, a numbing cream can be placed on the skin. This helps numb the skin over the port site.  Your health care provider uses a sterile technique to access the port. ? Your health care provider must put on a mask and sterile gloves. ? The skin over your port is cleaned carefully with an antiseptic and allowed to dry. ? The port is gently pinched between sterile gloves, and a needle is inserted into the port.  Only "non-coring" port needles should be used to access the port. Once the port is accessed, a blood return should be checked. This helps ensure that the port  is in the vein and is not clogged.  If your port needs to remain accessed for a constant infusion, a clear (transparent) bandage will be placed over the needle site. The bandage and needle will need to be changed every week, or as directed by your health care provider.  Keep the bandage covering the needle clean and dry. Do not get it wet. Follow your health care provider's instructions on how to take a shower or bath while the port is accessed.  If your port does not need to stay accessed, no bandage is needed over the port.  What is flushing? Flushing helps keep the port from getting clogged. Follow your health care provider's instructions on how and when to flush the port. Ports are usually flushed with saline solution or a medicine called heparin. The need for flushing will depend on how the port is used.  If the port is used for intermittent medicines or blood draws, the port will need to be flushed: ? After medicines have been given. ? After blood has been drawn. ? As part of routine maintenance.  If a constant infusion is running, the port may not need to be flushed.  How long will my port stay implanted? The port can stay in for as long as your health care provider thinks it is needed. When it is time for the port to come out, surgery will be   done to remove it. The procedure is similar to the one performed when the port was put in. When should I seek immediate medical care? When you have an implanted port, you should seek immediate medical care if:  You notice a bad smell coming from the incision site.  You have swelling, redness, or drainage at the incision site.  You have more swelling or pain at the port site or the surrounding area.  You have a fever that is not controlled with medicine.  This information is not intended to replace advice given to you by your health care provider. Make sure you discuss any questions you have with your health care provider. Document  Released: 12/14/2005 Document Revised: 05/21/2016 Document Reviewed: 08/21/2013 Elsevier Interactive Patient Education  2017 [employer]  

## 2017-11-15 NOTE — Progress Notes (Signed)
Nutrition follow-up completed with patient receiving chemotherapy for colon cancer. Weight is stable and documented as 128 pounds. Patient reports some loose stools via colostomy bag. Reports he is eating well.  He continues to drink Ensure Plus. Patient reports he is out of Ensure Plus and is requesting additional oral nutrition supplement samples.  Nutrition diagnosis: Inadequate oral intake continues.  Intervention: Educated patient to continue high-calorie high-protein intake with oral nutrition supplements. Provided second complementary case of Ensure Plus.  Monitoring, evaluation, goals: Patient will tolerate adequate calories and protein for weight maintenance.  Next visit: To be scheduled as needed with treatment.  **Disclaimer: This note was dictated with voice recognition software. Similar sounding words can inadvertently be transcribed and this note may contain transcription errors which may not have been corrected upon publication of note.**

## 2017-11-17 ENCOUNTER — Ambulatory Visit (HOSPITAL_BASED_OUTPATIENT_CLINIC_OR_DEPARTMENT_OTHER): Payer: Medicaid Other

## 2017-11-17 VITALS — BP 96/69 | HR 96 | Temp 98.2°F | Resp 18

## 2017-11-17 DIAGNOSIS — D500 Iron deficiency anemia secondary to blood loss (chronic): Secondary | ICD-10-CM

## 2017-11-17 DIAGNOSIS — C185 Malignant neoplasm of splenic flexure: Secondary | ICD-10-CM

## 2017-11-17 DIAGNOSIS — Z452 Encounter for adjustment and management of vascular access device: Secondary | ICD-10-CM | POA: Diagnosis not present

## 2017-11-17 DIAGNOSIS — I959 Hypotension, unspecified: Secondary | ICD-10-CM | POA: Diagnosis present

## 2017-11-17 DIAGNOSIS — Z95828 Presence of other vascular implants and grafts: Secondary | ICD-10-CM

## 2017-11-17 MED ORDER — HEPARIN SOD (PORK) LOCK FLUSH 100 UNIT/ML IV SOLN
500.0000 [IU] | INTRAVENOUS | Status: DC | PRN
  Administered 2017-11-17: 500 [IU] via INTRAVENOUS
  Filled 2017-11-17: qty 5

## 2017-11-17 MED ORDER — SODIUM CHLORIDE 0.9 % IV SOLN
Freq: Once | INTRAVENOUS | Status: DC
Start: 2017-11-17 — End: 2019-10-05

## 2017-11-17 MED ORDER — SODIUM CHLORIDE 0.9 % IV SOLN
Freq: Once | INTRAVENOUS | Status: AC
  Administered 2017-11-17: 13:00:00 via INTRAVENOUS

## 2017-11-17 MED ORDER — SODIUM CHLORIDE 0.9% FLUSH
10.0000 mL | INTRAVENOUS | Status: DC | PRN
  Administered 2017-11-17: 10 mL via INTRAVENOUS
  Filled 2017-11-17: qty 10

## 2017-11-17 MED FILL — DIPHENOXYLATE/ATROPINE TAB: 2.5-0.025 | 15 days supply | Qty: 60 | Fill #1

## 2017-11-17 NOTE — Progress Notes (Signed)
Pt with BP of 84/63 HR 104, denies any pain, dizziness and SOB, [name] made aware, BP rechecked manually it was 79/50 pt refused to have IVF, [name] came and talked to the pt and agreed for IVF of 400cc for 30 minutes.  Post hydration with BP of 96/69; HR 96 port was flushed and blood return noted. Pt stable upon discharge.

## 2017-11-17 NOTE — Patient Instructions (Signed)
Dehydration, Adult Dehydration is when there is not enough fluid or water in your body. This happens when you lose more fluids than you take in. Dehydration can range from mild to very bad. It should be treated right away to keep it from getting very bad. Symptoms of mild dehydration may include:  Thirst.  Dry lips.  Slightly dry mouth.  Dry, warm skin.  Dizziness. Symptoms of moderate dehydration may include:  Very dry mouth.  Muscle cramps.  Dark pee (urine). Pee may be the color of tea.  Your body making less pee.  Your eyes making fewer tears.  Heartbeat that is uneven or faster than normal (palpitations).  Headache.  Light-headedness, especially when you stand up from sitting.  Fainting (syncope). Symptoms of very bad dehydration may include:  Changes in skin, such as: ? Cold and clammy skin. ? Blotchy (mottled) or pale skin. ? Skin that does not quickly return to normal after being lightly pinched and let go (poor skin turgor).  Changes in body fluids, such as: ? Feeling very thirsty. ? Your eyes making fewer tears. ? Not sweating when body temperature is high, such as in hot weather. ? Your body making very little pee.  Changes in vital signs, such as: ? Weak pulse. ? Pulse that is more than 100 beats a minute when you are sitting still. ? Fast breathing. ? Low blood pressure.  Other changes, such as: ? Sunken eyes. ? Cold hands and feet. ? Confusion. ? Lack of energy (lethargy). ? Trouble waking up from sleep. ? Short-term weight loss. ? Unconsciousness. Follow these instructions at home:  If told by your doctor, drink an ORS: ? Make an ORS by using instructions on the package. ? Start by drinking small amounts, about  cup (120 mL) every 5-10 minutes. ? Slowly drink more until you have had the amount that your doctor said to have.  Drink enough clear fluid to keep your pee clear or pale yellow. If you were told to drink an ORS, finish the ORS  first, then start slowly drinking clear fluids. Drink fluids such as: ? Water. Do not drink only water by itself. Doing that can make the salt (sodium) level in your body get too low (hyponatremia). ? Ice chips. ? Fruit juice that you have added water to (diluted). ? Low-calorie sports drinks.  Avoid: ? Alcohol. ? Drinks that have a lot of sugar. These include high-calorie sports drinks, fruit juice that does not have water added, and soda. ? Caffeine. ? Foods that are greasy or have a lot of fat or sugar.  Take over-the-counter and prescription medicines only as told by your doctor.  Do not take salt tablets. Doing that can make the salt level in your body get too high (hypernatremia).  Eat foods that have minerals (electrolytes). Examples include bananas, oranges, potatoes, tomatoes, and spinach.  Keep all follow-up visits as told by your doctor. This is important. Contact a doctor if:  You have belly (abdominal) pain that: ? Gets worse. ? Stays in one area (localizes).  You have a rash.  You have a stiff neck.  You get angry or annoyed more easily than normal (irritability).  You are more sleepy than normal.  You have a harder time waking up than normal.  You feel: ? Weak. ? Dizzy. ? Very thirsty.  You have peed (urinated) only a small amount of very dark pee during 6-8 hours. Get help right away if:  You have symptoms of   very bad dehydration.  You cannot drink fluids without throwing up (vomiting).  Your symptoms get worse with treatment.  You have a fever.  You have a very bad headache.  You are throwing up or having watery poop (diarrhea) and it: ? Gets worse. ? Does not go away.  You have blood or something green (bile) in your throw-up.  You have blood in your poop (stool). This may cause poop to look black and tarry.  You have not peed in 6-8 hours.  You pass out (faint).  Your heart rate when you are sitting still is more than 100 beats a  minute.  You have trouble breathing. This information is not intended to replace advice given to you by your health care provider. Make sure you discuss any questions you have with your health care provider. Document Released: 10/10/2009 Document Revised: 07/03/2016 Document Reviewed: 02/07/2016 Elsevier Interactive Patient Education  2018 [employer]  

## 2017-11-17 NOTE — Addendum Note (Signed)
Addended by: [name] on: 11/17/2017 01:01 PM   Modules accepted: Orders

## 2017-11-25 ENCOUNTER — Ambulatory Visit (HOSPITAL_COMMUNITY)
Admission: RE | Admit: 2017-11-25 | Discharge: 2017-11-25 | Disposition: A | Discharge status: Home / Self Care | Payer: Medicaid Other | Source: Ambulatory Visit | Attending: Hematology | Admitting: Hematology

## 2017-11-25 DIAGNOSIS — I700 Atherosclerosis of aorta: Secondary | ICD-10-CM | POA: Diagnosis not present

## 2017-11-25 DIAGNOSIS — Z933 Colostomy status: Secondary | ICD-10-CM | POA: Diagnosis not present

## 2017-11-25 DIAGNOSIS — C786 Secondary malignant neoplasm of retroperitoneum and peritoneum: Secondary | ICD-10-CM | POA: Insufficient documentation

## 2017-11-25 DIAGNOSIS — C189 Malignant neoplasm of colon, unspecified: Secondary | ICD-10-CM | POA: Insufficient documentation

## 2017-11-25 MED ORDER — IOPAMIDOL (ISOVUE-300) INJECTION 61%
INTRAVENOUS | Status: AC
  Filled 2017-11-25: qty 100

## 2017-11-25 MED ORDER — IOPAMIDOL (ISOVUE-300) INJECTION 61%
100.0000 mL | Freq: Once | INTRAVENOUS | Status: AC | PRN
  Administered 2017-11-25: 100 mL via INTRAVENOUS

## 2017-11-29 ENCOUNTER — Encounter: Payer: Self-pay | Admitting: Nurse Practitioner

## 2017-11-29 ENCOUNTER — Ambulatory Visit: Payer: Medicaid Other | Admitting: Nutrition

## 2017-11-29 ENCOUNTER — Other Ambulatory Visit (HOSPITAL_BASED_OUTPATIENT_CLINIC_OR_DEPARTMENT_OTHER): Payer: Medicaid Other

## 2017-11-29 ENCOUNTER — Ambulatory Visit (HOSPITAL_BASED_OUTPATIENT_CLINIC_OR_DEPARTMENT_OTHER): Payer: Medicaid Other

## 2017-11-29 ENCOUNTER — Ambulatory Visit (HOSPITAL_BASED_OUTPATIENT_CLINIC_OR_DEPARTMENT_OTHER): Payer: Medicaid Other | Admitting: Nurse Practitioner

## 2017-11-29 VITALS — BP 120/78 | HR 96 | Temp 98.0°F | Resp 18 | Ht 69.0 in | Wt 126.9 lb

## 2017-11-29 DIAGNOSIS — D630 Anemia in neoplastic disease: Secondary | ICD-10-CM | POA: Diagnosis not present

## 2017-11-29 DIAGNOSIS — C186 Malignant neoplasm of descending colon: Secondary | ICD-10-CM

## 2017-11-29 DIAGNOSIS — E876 Hypokalemia: Secondary | ICD-10-CM | POA: Diagnosis not present

## 2017-11-29 DIAGNOSIS — Z5112 Encounter for antineoplastic immunotherapy: Secondary | ICD-10-CM | POA: Diagnosis present

## 2017-11-29 DIAGNOSIS — C189 Malignant neoplasm of colon, unspecified: Secondary | ICD-10-CM

## 2017-11-29 DIAGNOSIS — Z5111 Encounter for antineoplastic chemotherapy: Secondary | ICD-10-CM | POA: Diagnosis not present

## 2017-11-29 DIAGNOSIS — C762 Malignant neoplasm of abdomen: Secondary | ICD-10-CM

## 2017-11-29 DIAGNOSIS — Z95828 Presence of other vascular implants and grafts: Secondary | ICD-10-CM

## 2017-11-29 DIAGNOSIS — D500 Iron deficiency anemia secondary to blood loss (chronic): Secondary | ICD-10-CM

## 2017-11-29 DIAGNOSIS — Z452 Encounter for adjustment and management of vascular access device: Secondary | ICD-10-CM

## 2017-11-29 DIAGNOSIS — L270 Generalized skin eruption due to drugs and medicaments taken internally: Secondary | ICD-10-CM

## 2017-11-29 DIAGNOSIS — C786 Secondary malignant neoplasm of retroperitoneum and peritoneum: Secondary | ICD-10-CM

## 2017-11-29 LAB — COMPREHENSIVE METABOLIC PANEL
ALBUMIN: 3.6 g/dL (ref 3.5–5.0)
ALK PHOS: 94 U/L (ref 40–150)
ALT: 9 U/L (ref 0–55)
AST: 21 U/L (ref 5–34)
Anion Gap: 10 mEq/L (ref 3–11)
BUN: 5.4 mg/dL — ABNORMAL LOW (ref 7.0–26.0)
CHLORIDE: 108 meq/L (ref 98–109)
CO2: 23 meq/L (ref 22–29)
Calcium: 8.9 mg/dL (ref 8.4–10.4)
Creatinine: 0.8 mg/dL (ref 0.7–1.3)
GLUCOSE: 67 mg/dL — AB (ref 70–140)
POTASSIUM: 3.6 meq/L (ref 3.5–5.1)
SODIUM: 141 meq/L (ref 136–145)
Total Bilirubin: 0.34 mg/dL (ref 0.20–1.20)
Total Protein: 7.9 g/dL (ref 6.4–8.3)

## 2017-11-29 LAB — CBC WITH DIFFERENTIAL/PLATELET
BASO%: 1.2 % (ref 0.0–2.0)
Basophils Absolute: 0.1 10*3/uL (ref 0.0–0.1)
EOS%: 2.2 % (ref 0.0–7.0)
Eosinophils Absolute: 0.1 10*3/uL (ref 0.0–0.5)
HEMATOCRIT: 34.2 % — AB (ref 38.4–49.9)
HGB: 10.9 g/dL — ABNORMAL LOW (ref 13.0–17.1)
LYMPH#: 1.5 10*3/uL (ref 0.9–3.3)
LYMPH%: 33.3 % (ref 14.0–49.0)
MCH: 26.2 pg — ABNORMAL LOW (ref 27.2–33.4)
MCHC: 31.9 g/dL — AB (ref 32.0–36.0)
MCV: 82.2 fL (ref 79.3–98.0)
MONO#: 0.6 10*3/uL (ref 0.1–0.9)
MONO%: 14.5 % — ABNORMAL HIGH (ref 0.0–14.0)
NEUT#: 2.2 10*3/uL (ref 1.5–6.5)
NEUT%: 48.8 % (ref 39.0–75.0)
PLATELETS: 160 10*3/uL (ref 140–400)
RBC: 4.17 10*6/uL — ABNORMAL LOW (ref 4.20–5.82)
RDW: 18.7 % — ABNORMAL HIGH (ref 11.0–14.6)
WBC: 4.4 10*3/uL (ref 4.0–10.3)

## 2017-11-29 LAB — FERRITIN: Ferritin: 140 ng/ml (ref 22–316)

## 2017-11-29 LAB — CEA (IN HOUSE-CHCC): CEA (CHCC-In House): 9.37 ng/mL — ABNORMAL HIGH (ref 0.00–5.00)

## 2017-11-29 LAB — IRON AND TIBC
%SAT: 15 % — AB (ref 20–55)
Iron: 49 ug/dL (ref 42–163)
TIBC: 329 ug/dL (ref 202–409)
UIBC: 279 ug/dL (ref 117–376)

## 2017-11-29 LAB — MAGNESIUM: Magnesium: 1.6 mg/dl (ref 1.5–2.5)

## 2017-11-29 MED ORDER — PANITUMUMAB CHEMO INJECTION 100 MG/5ML
6.5000 mg/kg | Freq: Once | INTRAVENOUS | Status: AC
  Administered 2017-11-29: 400 mg via INTRAVENOUS
  Filled 2017-11-29: qty 20

## 2017-11-29 MED ORDER — HEPARIN SOD (PORK) LOCK FLUSH 100 UNIT/ML IV SOLN
500.0000 [IU] | Freq: Once | INTRAVENOUS | Status: DC | PRN
  Filled 2017-11-29: qty 5

## 2017-11-29 MED ORDER — SODIUM CHLORIDE 0.9% FLUSH
10.0000 mL | INTRAVENOUS | Status: DC | PRN
  Administered 2017-11-29: 10 mL via INTRAVENOUS
  Filled 2017-11-29: qty 10

## 2017-11-29 MED ORDER — IRINOTECAN HCL CHEMO INJECTION 100 MG/5ML: 150.0000 mg/m2 | Freq: Once | INTRAVENOUS | Status: DC

## 2017-11-29 MED ORDER — SODIUM CHLORIDE 0.9% FLUSH
10.0000 mL | INTRAVENOUS | Status: DC | PRN
  Filled 2017-11-29: qty 10

## 2017-11-29 MED ORDER — SODIUM CHLORIDE 0.9 % IV SOLN
Freq: Once | INTRAVENOUS | Status: AC
Start: 2017-11-29 — End: 2017-11-29
  Administered 2017-11-29: 12:00:00 via INTRAVENOUS

## 2017-11-29 MED ORDER — LEUCOVORIN CALCIUM INJECTION 350 MG
400.0000 mg/m2 | Freq: Once | INTRAMUSCULAR | Status: AC
  Administered 2017-11-29: 660 mg via INTRAVENOUS
  Filled 2017-11-29: qty 33

## 2017-11-29 MED ORDER — SODIUM CHLORIDE 0.9 % IV SOLN
2400.0000 mg/m2 | INTRAVENOUS | Status: DC
  Administered 2017-11-29: 3950 mg via INTRAVENOUS
  Filled 2017-11-29: qty 79

## 2017-11-29 MED ORDER — PALONOSETRON HCL INJECTION 0.25 MG/5ML: 0.2500 mg | Freq: Once | INTRAVENOUS | Status: DC

## 2017-11-29 MED ORDER — CLINDAMYCIN PHOS-BENZOYL PEROX 1.2-2.5 % EX GEL: 1.0000 "application " | Freq: Two times a day (BID) | CUTANEOUS | 0 refills | Status: DC

## 2017-11-29 MED ORDER — ATROPINE SULFATE 1 MG/ML IJ SOLN: 0.5000 mg | Freq: Once | INTRAMUSCULAR | Status: DC | PRN

## 2017-11-29 MED ORDER — DEXAMETHASONE SODIUM PHOSPHATE 10 MG/ML IJ SOLN: 10.0000 mg | Freq: Once | INTRAMUSCULAR | Status: DC

## 2017-11-29 MED FILL — CLINDAMYCIN PHOS-BENZOYL PE: 1-5 | 25 days supply | Qty: 50 | Fill #0

## 2017-11-29 NOTE — Progress Notes (Signed)
Nutrition follow up completed with patient receiving treatment for colon cancer. Weight is relatively stable at 126.9 pounds, December 3. Patient denies any nutrition impact symptoms. Reports colostomy output is good. Denies the need for ensure samples or coupons.  Nutrition diagnosis: Inadequate oral intake continues.  Intervention: Encouraged patient to continue oral nutrition supplements to provide extra calories and protein for weight stabilization. Teach back method used.  Monitoring, evaluation, goals: Patient will tolerate adequate calories and protein to minimize weight loss.  Next visit: Monday, December 17, during infusion.  **Disclaimer: This note was dictated with voice recognition software. Similar sounding words can inadvertently be transcribed and this note may contain transcription errors which may not have been corrected upon publication of note.**

## 2017-11-29 NOTE — Patient Instructions (Addendum)
[facility] Discharge Instructions for Patients Receiving Chemotherapy  Today you received the following chemotherapy agents: Vectibix, Leucovorin & (5FU) Florouracil  To help prevent nausea and vomiting after your treatment, we encourage you to take your nausea medication as directed  If you develop nausea and vomiting that is not controlled by your nausea medication, call the clinic.   BELOW ARE SYMPTOMS THAT SHOULD BE REPORTED IMMEDIATELY:  *FEVER GREATER THAN 100.5 F  *CHILLS WITH OR WITHOUT FEVER  NAUSEA AND VOMITING THAT IS NOT CONTROLLED WITH YOUR NAUSEA MEDICATION  *UNUSUAL SHORTNESS OF BREATH  *UNUSUAL BRUISING OR BLEEDING  TENDERNESS IN MOUTH AND THROAT WITH OR WITHOUT PRESENCE OF ULCERS  *URINARY PROBLEMS  *BOWEL PROBLEMS  UNUSUAL RASH Items with * indicate a potential emergency and should be followed up as soon as possible.  Feel free to call the clinic you have any questions or concerns. The clinic phone number is [phone].
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
## 2017-12-01 ENCOUNTER — Ambulatory Visit (HOSPITAL_BASED_OUTPATIENT_CLINIC_OR_DEPARTMENT_OTHER): Payer: Medicaid Other

## 2017-12-01 VITALS — BP 121/97 | HR 89 | Temp 98.1°F | Resp 18

## 2017-12-01 DIAGNOSIS — C186 Malignant neoplasm of descending colon: Secondary | ICD-10-CM

## 2017-12-01 DIAGNOSIS — Z452 Encounter for adjustment and management of vascular access device: Secondary | ICD-10-CM

## 2017-12-01 DIAGNOSIS — C189 Malignant neoplasm of colon, unspecified: Secondary | ICD-10-CM

## 2017-12-01 MED ORDER — HEPARIN SOD (PORK) LOCK FLUSH 100 UNIT/ML IV SOLN
250.0000 [IU] | Freq: Once | INTRAVENOUS | Status: AC | PRN
  Administered 2017-12-01: 250 [IU]
  Filled 2017-12-01: qty 5

## 2017-12-01 MED ORDER — SODIUM CHLORIDE 0.9% FLUSH
10.0000 mL | INTRAVENOUS | Status: DC | PRN
  Administered 2017-12-01: 10 mL
  Filled 2017-12-01: qty 10

## 2017-12-09 NOTE — Progress Notes (Signed)
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  -His case was previously reviewed in our GI tumor Board, unfortunately he has peritoneum metastasis, with a large 11 cm peritoneal metastasis in the left upper quadrant, which is consistent with peritoneal metastasis. -We previously reviewed the natural history of metastatic colon cancer. Unfortunately his cancer is incurable at this stage. We discussed the goal of therapy is palliative, to prolong his life and improve his quality of life. -He has started first line chemo FOLFOX. Due to history of pancytopenia, I'll skip the 5-FU bolus. -I previously discussed the [facility] genomic test result, which showed wild type KRAS, NRAS, and BRAF, MSI-stable. Based on this and his left-sided colon cancer, he will significantly benefit from EGFR inhibitor. He has started on panitumumab and has been  tolerating well. -I previously reviewed the CT scan from 04/09/17 with the patient in detail. He has had excellent partial response to the chemotherapy treatment, no other new lesions, we'll continue current chemotherapy regimen. -I reviewed his restaging CT scan from 07/05/2017, which showed a continuous response, no new lesions. We'll continue current treatment. -He previously developed a moderately thrombocytopenia, chemotherapy dose was reduced  --Due to Pancreatic Abscess,  His recent hospitalization, chemotherapy has been held. - he has  Being recovering well from his recent hospitalization, still in rehabilitation, but ambulates  Without any difficulty, he is also eating better. - due to his recent thrombosis and GI bleeding, he is not a candidate for Avastin for now. Given questionable disease progression, I recommend him to continue panitumumab for now - due to his recent bowel obstruction, I think he probably has had disease progression, although scan showed stable disease . I recommend to switch his chemotherapy to irinotecan alone, or FOLFIRI if he is able to tolerate.  - the goal of therapy is palliative. - we again discussed the incurable nature of his cancer, an overall poor prognosis. The goal of therapy is palliative. -Started irinotecan and panitumumab on 09/21/17. Tolerated well with increased ostomy output. Added 5-fu back with cycle 2 on 10/04/17 and reduced FOLFIRI due to overall limited PS. Increased to full dose FOLFIRI on 10/18/17, he tolerated well overall with mild to moderate diarrhea. -encouraged him to manage his diarrhea with imodium and lomotil  -Due to his poor toleration of full dose FOLFIRI with diarrhea and emesis, I will reduce irinotecan back to 144m/m2. He has just recovered last cycle chemotherapy, [place], I will reduce his chemo dose further on 11/15/17 for cyle 5 -I suggest he increase his Lomotil to up to 6-8  tablets daily if he experiences significant diarrhea again.   -We discussed his CT CAP on 11/25/17 that indicates response to therapy of intraperitoneal metastasis including within the splenic hilum; no evidence of metastatic disease in the chest -Pt did not tolerated the cycle 5 FOLFIRI well, despite dose reduction on both agents.  He is very reluctant to continue same regiment. Given his restaging CT scan showed mixed response, but overall stable disease,  I will hold Irinotecan, and continue 5-FU/leucovorin and Panitumumab as maintenance therapy. He agreed and started on 11/29/17 -Next restaging in 2-3 months.  -he has tolerated maintenance therapy well, without significant diarrhea.  -labs reviewed, CMP WNL, Hg at 11.2 and plt at 119K. His Mg is low at 1.4. I prescribed magnesium pill to take BID for 1 week then to continue once daily.  -f/u  in 2 weeks      2. History of right LE DVT -I previously reviewed his ultrasound results from 02/17/17, which showed DVT involving the femoral, popliteal, and peritoneal vein of the right lower extremity from 2/22. -He had history of right lower extremity DVT after surgery before  - his Xarelto has been held due to GI bleeding he has a IVC placed -No recurrence of bleeding since IVC placement.   3. 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 -- continue oxycodone as needed, pain controlled  -Has also developed some back pain in the past few weeks, related to chemo.  Using oxycodone for the pain. -I called in Neurontin 125m for his shooting leg pain (11/15/17). He can start low dose with 1 tab at night and increase up to 3 tablets in the evening if he can tolerate it.  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 -He has received IV Feraheme twice, but his anemia has not improved. He likely has  component of anemia of chronic disease secondary to his underlying malignancy. -Ferritin 196 on 10/04/17, WNL, Hgb 9.8; will continue to monitor  -Persistent anemia, Hg improved to 11.2 (12/13/17). Continue ferrous Sulfate, no need for blood transfusion today.   5. Weight loss, malnutrition, anorexia -Secondary to chemotherapy and cancer -The patient will continue to drink dietary supplements. -He has gained some weight lately - I again strongly encouraged him to try to eat more to avoid losing excess weight. I again recommended he drink 2 ensure or boost supplements a day to help. -I prescribed mirtazapine on 05/25/17 in attempts to help his appetite. He knows to take it once every evening. - continue follow-up with dietitian, previous f/u 10/22 -His weight is stable overall lately   6. Pancreatic Abscess -Presents to hospital on 07/09/17 for abdominal pain. Based on SP CT from 07/10/17 he found to have an abscess. Draining tube placed and he received prolonged course of antibiotics  - His percutaneous drainage tube has been removed  -this has resolved   7. Bowel obstruction due to his colon mass at the splenic flexure and surrounding inflammation due to recent abscess, s/u diverting colostomy  -He was hospitalized on 07/29/17 for SBO  -He had sigmoidoscopy and loop Colostomy and has a J tube placed.  - he is tolerating diet well now. -Bowel movements overall have been normal, but sometimes he has softer stools and are more loose.  -I again reviewed the usage of both imodium and colace and in what context he should be using them both for his stool consistencies.  -He has colostomy prolapse, he knows to avoid lifting and carry heavy things -He will see his surgeon early 10/2017 -Diarrhea controlled  -He has loose to watery stool which improves a few days after chemo. I prescribed lomotil on 11/01/17. He should take imodium first and if not enough he should take lomotil to control his diarrhea.      8. Goal of care discussion  -We again discussed the incurable nature of his cancer, and the overall poor prognosis, especially if he does not have good response to chemotherapy or progress on chemo -The patient understands the goal of care is palliative. -I have recommended DNR/DNI, he will think about it.  9. Hypokalemia  -10/18/17 Cmet indicates hypokalemia 3.3, decreased from 3.4 at last visit.  -  I prescribed oral K 20 mEq 1 tablet daily. -potassium increased to 3.6 on (11/15/17). Will continue potassium supplement for now.  -Potassium normalized to 3.6 on 12/13/17   10 Skin rash  -Moderate pruritic acne type skin rash with boils to mid abdomen, likely secondary to vectibix, although the location is unusual with no rash elsewhere on the body - He used cleocin in the past; I refilled prescription on 11/29/17. He will use clindamycin with benzoyl peroxide gel BID. If this does not improve, he will need oral antibiotics.  -Much improved   Plan: -Refilled lomotil and oxycodone today  -Labs reviewed, Mg is 1.4, I prescribed magnesium pill to take BID for 1 week then to continue once daily.  -Lab, flush, f/u and  5-fu pump and panitumumab on 12/31 -Add chemo 5-fu pump and panitumumab on 1/14  -Lab, flush, f/u and 5-fu pump and panitumumab on 1/28    All questions were answered. The patient knows to call the clinic with any problems, questions or concerns.  I spent 20 minutes counseling the patient face to face. The total time spent in the appointment was  25 minutes and more than 50% was on counseling.    [name], MD 12/13/2017   This document serves as a record of services personally performed by [name], MD. It was created on her behalf by Joslyn Devon, a trained medical scribe. The creation of this record is based on the scribe's personal observations and the provider's statements to them.    I have reviewed the above documentation for accuracy and completeness, and I  agree with the above.

## 2017-12-13 ENCOUNTER — Other Ambulatory Visit (HOSPITAL_BASED_OUTPATIENT_CLINIC_OR_DEPARTMENT_OTHER): Payer: Medicaid Other

## 2017-12-13 ENCOUNTER — Ambulatory Visit (HOSPITAL_BASED_OUTPATIENT_CLINIC_OR_DEPARTMENT_OTHER): Payer: Medicaid Other

## 2017-12-13 ENCOUNTER — Telehealth: Payer: Self-pay | Admitting: Hematology

## 2017-12-13 ENCOUNTER — Encounter: Payer: Self-pay | Admitting: Hematology

## 2017-12-13 ENCOUNTER — Ambulatory Visit (HOSPITAL_BASED_OUTPATIENT_CLINIC_OR_DEPARTMENT_OTHER): Payer: Medicaid Other | Admitting: Hematology

## 2017-12-13 ENCOUNTER — Ambulatory Visit: Payer: Medicaid Other | Admitting: Nutrition

## 2017-12-13 VITALS — HR 83

## 2017-12-13 VITALS — BP 112/80 | HR 109 | Temp 98.7°F | Resp 20 | Ht 69.0 in | Wt 126.8 lb

## 2017-12-13 DIAGNOSIS — D500 Iron deficiency anemia secondary to blood loss (chronic): Secondary | ICD-10-CM

## 2017-12-13 DIAGNOSIS — Z452 Encounter for adjustment and management of vascular access device: Secondary | ICD-10-CM

## 2017-12-13 DIAGNOSIS — Z5111 Encounter for antineoplastic chemotherapy: Secondary | ICD-10-CM

## 2017-12-13 DIAGNOSIS — E46 Unspecified protein-calorie malnutrition: Secondary | ICD-10-CM

## 2017-12-13 DIAGNOSIS — C189 Malignant neoplasm of colon, unspecified: Secondary | ICD-10-CM

## 2017-12-13 DIAGNOSIS — C185 Malignant neoplasm of splenic flexure: Secondary | ICD-10-CM

## 2017-12-13 DIAGNOSIS — R634 Abnormal weight loss: Secondary | ICD-10-CM | POA: Diagnosis not present

## 2017-12-13 DIAGNOSIS — Z86718 Personal history of other venous thrombosis and embolism: Secondary | ICD-10-CM | POA: Diagnosis not present

## 2017-12-13 DIAGNOSIS — R630 Anorexia: Secondary | ICD-10-CM | POA: Diagnosis not present

## 2017-12-13 DIAGNOSIS — C762 Malignant neoplasm of abdomen: Secondary | ICD-10-CM

## 2017-12-13 DIAGNOSIS — C786 Secondary malignant neoplasm of retroperitoneum and peritoneum: Secondary | ICD-10-CM

## 2017-12-13 DIAGNOSIS — Z5112 Encounter for antineoplastic immunotherapy: Secondary | ICD-10-CM | POA: Diagnosis present

## 2017-12-13 DIAGNOSIS — D630 Anemia in neoplastic disease: Secondary | ICD-10-CM | POA: Diagnosis not present

## 2017-12-13 DIAGNOSIS — Z95828 Presence of other vascular implants and grafts: Secondary | ICD-10-CM

## 2017-12-13 DIAGNOSIS — C186 Malignant neoplasm of descending colon: Secondary | ICD-10-CM

## 2017-12-13 DIAGNOSIS — D509 Iron deficiency anemia, unspecified: Secondary | ICD-10-CM

## 2017-12-13 LAB — COMPREHENSIVE METABOLIC PANEL
ALBUMIN: 3.7 g/dL (ref 3.5–5.0)
ALK PHOS: 95 U/L (ref 40–150)
ALT: 10 U/L (ref 0–55)
AST: 29 U/L (ref 5–34)
Anion Gap: 11 mEq/L (ref 3–11)
BUN: 12.1 mg/dL (ref 7.0–26.0)
CALCIUM: 9.3 mg/dL (ref 8.4–10.4)
CO2: 24 mEq/L (ref 22–29)
Chloride: 105 mEq/L (ref 98–109)
Creatinine: 1 mg/dL (ref 0.7–1.3)
Glucose: 104 mg/dl (ref 70–140)
POTASSIUM: 3.6 meq/L (ref 3.5–5.1)
Sodium: 140 mEq/L (ref 136–145)
Total Bilirubin: 0.31 mg/dL (ref 0.20–1.20)
Total Protein: 8.2 g/dL (ref 6.4–8.3)

## 2017-12-13 LAB — CBC WITH DIFFERENTIAL/PLATELET
BASO%: 0.9 % (ref 0.0–2.0)
Basophils Absolute: 0.1 10*3/uL (ref 0.0–0.1)
EOS%: 1.1 % (ref 0.0–7.0)
Eosinophils Absolute: 0.1 10*3/uL (ref 0.0–0.5)
HEMATOCRIT: 35.5 % — AB (ref 38.4–49.9)
HEMOGLOBIN: 11.2 g/dL — AB (ref 13.0–17.1)
LYMPH#: 1.3 10*3/uL (ref 0.9–3.3)
LYMPH%: 20 % (ref 14.0–49.0)
MCH: 26.4 pg — ABNORMAL LOW (ref 27.2–33.4)
MCHC: 31.6 g/dL — ABNORMAL LOW (ref 32.0–36.0)
MCV: 83.5 fL (ref 79.3–98.0)
MONO#: 0.6 10*3/uL (ref 0.1–0.9)
MONO%: 9.4 % (ref 0.0–14.0)
NEUT%: 68.6 % (ref 39.0–75.0)
NEUTROS ABS: 4.3 10*3/uL (ref 1.5–6.5)
PLATELETS: 119 10*3/uL — AB (ref 140–400)
RBC: 4.25 10*6/uL (ref 4.20–5.82)
RDW: 21.2 % — AB (ref 11.0–14.6)
WBC: 6.3 10*3/uL (ref 4.0–10.3)

## 2017-12-13 LAB — MAGNESIUM: Magnesium: 1.4 mg/dl — CL (ref 1.5–2.5)

## 2017-12-13 MED ORDER — HEPARIN SOD (PORK) LOCK FLUSH 100 UNIT/ML IV SOLN
500.0000 [IU] | Freq: Once | INTRAVENOUS | Status: DC | PRN
  Filled 2017-12-13: qty 5

## 2017-12-13 MED ORDER — DIPHENOXYLATE-ATROPINE 2.5-0.025 MG PO TABS: 1.0000 | ORAL_TABLET | Freq: Four times a day (QID) | ORAL | 2 refills | Status: DC | PRN

## 2017-12-13 MED ORDER — SODIUM CHLORIDE 0.9% FLUSH
10.0000 mL | INTRAVENOUS | Status: DC | PRN
  Administered 2017-12-13: 10 mL via INTRAVENOUS
  Filled 2017-12-13: qty 10

## 2017-12-13 MED ORDER — SODIUM CHLORIDE 0.9% FLUSH
10.0000 mL | INTRAVENOUS | Status: DC | PRN
  Filled 2017-12-13: qty 10

## 2017-12-13 MED ORDER — MAGNESIUM OXIDE 400 (241.3 MG) MG PO TABS: 400.0000 mg | ORAL_TABLET | Freq: Two times a day (BID) | ORAL | 1 refills | Status: DC

## 2017-12-13 MED ORDER — SODIUM CHLORIDE 0.9 % IV SOLN
6.5000 mg/kg | Freq: Once | INTRAVENOUS | Status: AC
  Administered 2017-12-13: 400 mg via INTRAVENOUS
  Filled 2017-12-13: qty 20

## 2017-12-13 MED ORDER — SODIUM CHLORIDE 0.9 % IV SOLN
Freq: Once | INTRAVENOUS | Status: AC
  Administered 2017-12-13: 09:00:00 via INTRAVENOUS

## 2017-12-13 MED ORDER — OXYCODONE HCL 10 MG PO TABS: 10.0000 mg | ORAL_TABLET | Freq: Four times a day (QID) | ORAL | 0 refills | Status: DC | PRN

## 2017-12-13 MED ORDER — SODIUM CHLORIDE 0.9 % IV SOLN
2400.0000 mg/m2 | INTRAVENOUS | Status: DC
  Administered 2017-12-13: 3950 mg via INTRAVENOUS
  Filled 2017-12-13: qty 79

## 2017-12-13 MED FILL — oxyCODONE HCL 10 MG TABS: 10 | 23 days supply | Qty: 90 | Fill #0

## 2017-12-13 MED FILL — DIPHENOXYLATE/ATROPINE TAB: 2.5-0.025 | 8 days supply | Qty: 90 | Fill #0

## 2017-12-13 NOTE — Telephone Encounter (Signed)
Scheduled appt per 12/17 los - Gave patient aVS and calender per los.

## 2017-12-13 NOTE — Patient Instructions (Signed)
[facility] Discharge Instructions for Patients Receiving Chemotherapy  Today you received the following chemotherapy agent: Vectibix  To help prevent nausea and vomiting after your treatment, we encourage you to take your nausea medication as directed  If you develop nausea and vomiting that is not controlled by your nausea medication, call the clinic.   BELOW ARE SYMPTOMS THAT SHOULD BE REPORTED IMMEDIATELY:  *FEVER GREATER THAN 100.5 F  *CHILLS WITH OR WITHOUT FEVER  NAUSEA AND VOMITING THAT IS NOT CONTROLLED WITH YOUR NAUSEA MEDICATION  *UNUSUAL SHORTNESS OF BREATH  *UNUSUAL BRUISING OR BLEEDING  TENDERNESS IN MOUTH AND THROAT WITH OR WITHOUT PRESENCE OF ULCERS  *URINARY PROBLEMS  *BOWEL PROBLEMS  UNUSUAL RASH Items with * indicate a potential emergency and should be followed up as soon as possible.  Feel free to call the clinic you have any questions or concerns. The clinic phone number is [phone].

## 2017-12-13 NOTE — Progress Notes (Signed)
Brief nutrition follow-up completed with patient during infusion for metastatic colon cancer. Weight stable at 126.8 pounds December 17. Patient continues to deny nutrition impact symptoms. Reports colostomy output is good and is functioning normally.  Nutrition diagnosis: Inadequate oral intake has improved.  Intervention: Recommended patient continue oral nutrition supplements for additional calories and protein. Continue adequate fluids Encouraged patient to contact me for questions or concerns.  Monitoring, evaluation, goals: Patient will continue to work to increase calories and protein for weight stabilization.  Next visit: Monday, January 14 during infusion.  **Disclaimer: This note was dictated with voice recognition software. Similar sounding words can inadvertently be transcribed and this note may contain transcription errors which may not have been corrected upon publication of note.**

## 2017-12-15 ENCOUNTER — Ambulatory Visit (HOSPITAL_BASED_OUTPATIENT_CLINIC_OR_DEPARTMENT_OTHER): Payer: Medicaid Other

## 2017-12-15 VITALS — BP 120/78 | HR 88 | Temp 98.2°F | Resp 18

## 2017-12-15 DIAGNOSIS — C189 Malignant neoplasm of colon, unspecified: Secondary | ICD-10-CM

## 2017-12-15 DIAGNOSIS — Z452 Encounter for adjustment and management of vascular access device: Secondary | ICD-10-CM | POA: Diagnosis present

## 2017-12-15 DIAGNOSIS — C185 Malignant neoplasm of splenic flexure: Secondary | ICD-10-CM

## 2017-12-15 MED ORDER — SODIUM CHLORIDE 0.9% FLUSH
10.0000 mL | INTRAVENOUS | Status: DC | PRN
  Administered 2017-12-15: 10 mL
  Filled 2017-12-15: qty 10

## 2017-12-15 MED ORDER — HEPARIN SOD (PORK) LOCK FLUSH 100 UNIT/ML IV SOLN
250.0000 [IU] | Freq: Once | INTRAVENOUS | Status: AC | PRN
  Administered 2017-12-15: 250 [IU]
  Filled 2017-12-15: qty 5

## 2017-12-23 NOTE — Progress Notes (Signed)
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 -We previously reviewed the natural history of metastatic colon cancer. Unfortunately his cancer is incurable at this stage. We discussed the goal of therapy is palliative, to prolong his life and improve his quality of life. -He has started first line chemo FOLFOX. Due to history of pancytopenia, I'll skip the 5-FU bolus. -I previously discussed the [facility] genomic test result, which showed wild type KRAS, NRAS, and BRAF, MSI-stable. Based on this and his left-sided colon cancer, he will significantly benefit from EGFR inhibitor. He has started on panitumumab and has been tolerating well. -I previously reviewed the CT scan from 04/09/17 with the patient in detail. He has had  excellent partial response to the chemotherapy treatment, no other new lesions, we'll continue current chemotherapy regimen. -I reviewed his restaging CT scan from 07/05/2017, which showed a continuous response, no new lesions. We'll continue current treatment. -He previously developed a moderately thrombocytopenia, chemotherapy dose was reduced  --Due to Pancreatic Abscess,  His recent hospitalization, chemotherapy has been held. - he has  Being recovering well from his recent hospitalization, still in rehabilitation, but ambulates  Without any difficulty, he is also eating better. - due to his recent thrombosis and GI bleeding, he is not a candidate for Avastin for now. Given questionable disease progression, I recommend him to continue panitumumab for now - due to his recent bowel obstruction, I think he probably has had disease progression, although scan showed stable disease . I recommend to switch his chemotherapy to irinotecan alone, or FOLFIRI if he is able to tolerate.  - the goal of therapy is palliative. - we again discussed the incurable nature of his cancer, an overall poor prognosis. The goal of therapy is palliative. -Started irinotecan and panitumumab on 09/21/17. Tolerated well with increased ostomy output. Added 5-fu back with cycle 2 on 10/04/17 and reduced FOLFIRI due to overall limited PS. Increased to full dose FOLFIRI on 10/18/17, he tolerated well overall with mild to moderate diarrhea. -encouraged him to manage his diarrhea with imodium and lomotil  -Due to his poor toleration of full dose FOLFIRI with diarrhea and emesis, I will reduce irinotecan back to 14m/m2. He has just recovered last cycle chemotherapy, [place], I will reduce his chemo dose further on 11/15/17 for cyle 5 -I suggest he increase his Lomotil to up to 6-8 tablets daily if he experiences significant diarrhea again.   -We previously discussed his CT CAP on  11/25/17 that indicates response to therapy of intraperitoneal metastasis including within the splenic hilum; no evidence of metastatic disease in the chest -Pt did not tolerated the cycle 5 FOLFIRI well, despite dose reduction on both agents.  He is very reluctant to continue same regiment. Given his restaging CT scan showed mixed response, but overall stable disease,  I will hold Irinotecan, and continue 5-FU/leucovorin and Panitumumab as maintenance therapy. He agreed and started on 11/29/17 -Next restaging in 2-3 months.  -he has tolerated maintenance therapy well, without significant diarrhea.  -labs reviewed, CMP WNL, Hg at 11.2 and plt at 119K. His Mg is low at 1.4. I prescribed magnesium pill to take BID for 1 week then to continue once daily.  -12/27/2017 labs reviewed, Hg at 9.4 and PLT at 126. His Mg is low at 1.0. Will order Magnesium today due to decreased  levels.  -Advised the patient to take Magnesium BID to aid with decreased potassium levels.  -Will refill potassium levels and advised the patient to take it BID when he has bad episodes of diarrhea. Otherwise he was advised to take it once a day.  -f/u in 4 weeks      2. History of right LE DVT -I previously reviewed his ultrasound results from 02/17/17, which showed DVT involving the femoral, popliteal, and peritoneal vein of the right lower extremity from 2/22. -He had history of right lower extremity DVT after surgery before  - his Xarelto has been held due to GI bleeding he has a IVC placed -No recurrence of bleeding since IVC placement.   3. 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 -- continue oxycodone as needed, pain controlled  -Has also developed some back pain in the past few weeks, related to chemo.  Using oxycodone for the pain. -I called in Neurontin 120m for his shooting leg pain (11/15/17). He can start low dose with 1 tab at night and increase up to 3 tablets in the  evening if he can tolerate it.  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 -He has received IV Feraheme twice, but his anemia has not improved. He likely has component of anemia of chronic disease secondary to his underlying malignancy. -Ferritin 196 on 10/04/17, WNL, Hgb 9.8; will continue to monitor  -Persistent anemia, Hg improved to 11.2 (12/13/17). Continue ferrous Sulfate, no need for blood transfusion today.   5. Weight loss, malnutrition, anorexia -Secondary to chemotherapy and cancer -The patient will continue to drink dietary supplements. -He has gained some weight lately - I again strongly encouraged him to try to eat more to avoid losing excess weight. I again recommended he drink 2 ensure or boost supplements a day to help. -I previously prescribed mirtazapine on 05/25/17 in attempts to help his appetite. He knows to take it once every evening. - continue follow-up with dietitian, previous f/u 10/22 -His weight is stable overall lately   6. Pancreatic Abscess -Presents to hospital on 07/09/17 for abdominal pain. Based on SP CT from 07/10/17 he found to have an abscess. Draining tube placed and he received prolonged course of antibiotics  - His percutaneous drainage tube has been removed  -this has resolved   7. Bowel obstruction due to his colon mass at the splenic flexure and surrounding inflammation due to recent abscess, s/p diverting colostomy  -He was hospitalized on 07/29/17 for SBO  -He had sigmoidoscopy and loop Colostomy and has a J tube placed.  - he is tolerating diet well now. -Bowel movements overall have been normal, but sometimes he has softer stools and are more loose.  -I again reviewed the usage of both imodium and colace and in what context he should be using them both for his stool consistencies.  -He has colostomy prolapse, he knows to avoid  lifting and carry heavy things -He will see his surgeon early 10/2017  -Diarrhea controlled  -He has loose to watery stools which improves a few days after chemo. I prescribed lomotil on 11/01/17. He should take imodium first and if not enough he should take lomotil to control his diarrhea.    8. Goal of care discussion  -We again discussed the incurable nature of his cancer, and the overall poor prognosis,  especially if he does not have good response to chemotherapy or progress on chemo -The patient understands the goal of care is palliative. -I have previously recommended DNR/DNI, he will think about it.  9. Hypokalemia  -10/18/17 Cmet indicates hypokalemia 3.3, decreased from 3.4 at last visit.  -I previously prescribed oral K 20 mEq 1 tablet daily. -potassium previously increased to 3.6 on (11/15/17). Will continue potassium supplement for now.  -K 3.2 today, will increase KCL to 39mq bid   10 Skin rash  -Moderate pruritic acne type skin rash with boils to mid abdomen, likely secondary to vectibix, although the location is unusual with no rash elsewhere on the body - He used cleocin in the past; I refilled prescription on 11/29/17. He will use clindamycin with benzoyl peroxide gel BID. If this does not improve, he will need oral antibiotics.  -Much improved   Plan: -Lab, flush, chemo 5-fu pump and panitumumab today and every 2 weeks -Refilled Clindamycin gel and Potassium   -f/u in 4 weeks    All questions were answered. The patient knows to call the clinic with any problems, questions or concerns.  I spent 20 minutes counseling the patient face to face. The total time spent in the appointment was 25 minutes and more than 50% was on counseling.    YTruitt Merle MD 12/27/2017  This document serves as a record of services personally performed by YTruitt Merle MD. It was created on her behalf by SSteva Colder a trained medical scribe. The creation of this record is based on the scribe's  personal observations and the provider's statements to them.   I have reviewed the above documentation for accuracy and completeness, and I agree with the above.

## 2017-12-27 ENCOUNTER — Ambulatory Visit (HOSPITAL_BASED_OUTPATIENT_CLINIC_OR_DEPARTMENT_OTHER): Payer: Medicaid Other

## 2017-12-27 ENCOUNTER — Ambulatory Visit (HOSPITAL_BASED_OUTPATIENT_CLINIC_OR_DEPARTMENT_OTHER): Payer: Medicaid Other | Admitting: Hematology

## 2017-12-27 ENCOUNTER — Telehealth: Payer: Self-pay | Admitting: Hematology

## 2017-12-27 ENCOUNTER — Encounter: Payer: Self-pay | Admitting: Nutrition

## 2017-12-27 ENCOUNTER — Encounter: Payer: Self-pay | Admitting: Hematology

## 2017-12-27 ENCOUNTER — Other Ambulatory Visit (HOSPITAL_BASED_OUTPATIENT_CLINIC_OR_DEPARTMENT_OTHER): Payer: Medicaid Other

## 2017-12-27 ENCOUNTER — Encounter: Payer: Medicaid Other | Admitting: Nutrition

## 2017-12-27 VITALS — BP 100/71 | HR 81 | Resp 18

## 2017-12-27 VITALS — BP 91/58 | HR 106 | Temp 98.5°F | Resp 16 | Ht 69.0 in | Wt 131.7 lb

## 2017-12-27 DIAGNOSIS — D630 Anemia in neoplastic disease: Secondary | ICD-10-CM

## 2017-12-27 DIAGNOSIS — Z5112 Encounter for antineoplastic immunotherapy: Secondary | ICD-10-CM

## 2017-12-27 DIAGNOSIS — D500 Iron deficiency anemia secondary to blood loss (chronic): Secondary | ICD-10-CM

## 2017-12-27 DIAGNOSIS — R21 Rash and other nonspecific skin eruption: Secondary | ICD-10-CM | POA: Diagnosis not present

## 2017-12-27 DIAGNOSIS — Z86718 Personal history of other venous thrombosis and embolism: Secondary | ICD-10-CM | POA: Diagnosis not present

## 2017-12-27 DIAGNOSIS — E876 Hypokalemia: Secondary | ICD-10-CM | POA: Diagnosis not present

## 2017-12-27 DIAGNOSIS — C189 Malignant neoplasm of colon, unspecified: Secondary | ICD-10-CM

## 2017-12-27 DIAGNOSIS — C786 Secondary malignant neoplasm of retroperitoneum and peritoneum: Secondary | ICD-10-CM | POA: Diagnosis not present

## 2017-12-27 DIAGNOSIS — D509 Iron deficiency anemia, unspecified: Secondary | ICD-10-CM

## 2017-12-27 DIAGNOSIS — C186 Malignant neoplasm of descending colon: Secondary | ICD-10-CM

## 2017-12-27 DIAGNOSIS — Z452 Encounter for adjustment and management of vascular access device: Secondary | ICD-10-CM

## 2017-12-27 DIAGNOSIS — Z5111 Encounter for antineoplastic chemotherapy: Secondary | ICD-10-CM | POA: Diagnosis not present

## 2017-12-27 DIAGNOSIS — Z95828 Presence of other vascular implants and grafts: Secondary | ICD-10-CM

## 2017-12-27 DIAGNOSIS — L270 Generalized skin eruption due to drugs and medicaments taken internally: Secondary | ICD-10-CM

## 2017-12-27 DIAGNOSIS — C762 Malignant neoplasm of abdomen: Secondary | ICD-10-CM

## 2017-12-27 LAB — CBC WITH DIFFERENTIAL/PLATELET
BASO%: 0.8 % (ref 0.0–2.0)
Basophils Absolute: 0.1 10*3/uL (ref 0.0–0.1)
EOS ABS: 0.1 10*3/uL (ref 0.0–0.5)
EOS%: 0.8 % (ref 0.0–7.0)
HCT: 29 % — ABNORMAL LOW (ref 38.4–49.9)
HGB: 9.4 g/dL — ABNORMAL LOW (ref 13.0–17.1)
LYMPH%: 19.1 % (ref 14.0–49.0)
MCH: 27.3 pg (ref 27.2–33.4)
MCHC: 32.4 g/dL (ref 32.0–36.0)
MCV: 84.3 fL (ref 79.3–98.0)
MONO#: 0.8 10*3/uL (ref 0.1–0.9)
MONO%: 13.3 % (ref 0.0–14.0)
NEUT%: 66 % (ref 39.0–75.0)
NEUTROS ABS: 4.1 10*3/uL (ref 1.5–6.5)
PLATELETS: 126 10*3/uL — AB (ref 140–400)
RBC: 3.44 10*6/uL — AB (ref 4.20–5.82)
RDW: 18.1 % — ABNORMAL HIGH (ref 11.0–14.6)
WBC: 6.2 10*3/uL (ref 4.0–10.3)
lymph#: 1.2 10*3/uL (ref 0.9–3.3)
nRBC: 0 % (ref 0–0)

## 2017-12-27 LAB — COMPREHENSIVE METABOLIC PANEL
ALBUMIN: 3 g/dL — AB (ref 3.5–5.0)
ALK PHOS: 69 U/L (ref 40–150)
ALT: 6 U/L (ref 0–55)
AST: 16 U/L (ref 5–34)
Anion Gap: 9 mEq/L (ref 3–11)
BUN: 11.3 mg/dL (ref 7.0–26.0)
CALCIUM: 8.3 mg/dL — AB (ref 8.4–10.4)
CHLORIDE: 102 meq/L (ref 98–109)
CO2: 23 mEq/L (ref 22–29)
CREATININE: 0.9 mg/dL (ref 0.7–1.3)
EGFR: >60 mL/min/{1.73_m2} (ref >60)
GLUCOSE: 135 mg/dL (ref 70–140)
Potassium: 3.2 mEq/L — ABNORMAL LOW (ref 3.5–5.1)
Sodium: 134 mEq/L — ABNORMAL LOW (ref 136–145)
Total Bilirubin: 0.52 mg/dL (ref 0.20–1.20)
Total Protein: 7.8 g/dL (ref 6.4–8.3)

## 2017-12-27 LAB — CEA (IN HOUSE-CHCC): CEA (CHCC-IN HOUSE): 8.01 ng/mL — AB (ref 0.00–5.00)

## 2017-12-27 LAB — MAGNESIUM: Magnesium: 1 mg/dl — CL (ref 1.5–2.5)

## 2017-12-27 LAB — FERRITIN: Ferritin: 269 ng/ml (ref 22–316)

## 2017-12-27 MED ORDER — PROCHLORPERAZINE MALEATE 10 MG PO TABS
ORAL_TABLET | ORAL | Status: AC
  Filled 2017-12-27: qty 1

## 2017-12-27 MED ORDER — PANITUMUMAB CHEMO INJECTION 100 MG/5ML
6.5000 mg/kg | Freq: Once | INTRAVENOUS | Status: AC
  Administered 2017-12-27: 400 mg via INTRAVENOUS
  Filled 2017-12-27: qty 20

## 2017-12-27 MED ORDER — SODIUM CHLORIDE 0.9 % IV SOLN
2.0000 g | Freq: Once | INTRAVENOUS | Status: AC
  Administered 2017-12-27: 2 g via INTRAVENOUS
  Filled 2017-12-27: qty 4

## 2017-12-27 MED ORDER — SODIUM CHLORIDE 0.9% FLUSH
10.0000 mL | INTRAVENOUS | Status: DC | PRN
  Filled 2017-12-27: qty 10

## 2017-12-27 MED ORDER — HEPARIN SOD (PORK) LOCK FLUSH 100 UNIT/ML IV SOLN
500.0000 [IU] | INTRAVENOUS | Status: DC | PRN
  Filled 2017-12-27: qty 5

## 2017-12-27 MED ORDER — PROCHLORPERAZINE MALEATE 10 MG PO TABS: 10.0000 mg | ORAL_TABLET | Freq: Once | ORAL | Status: DC

## 2017-12-27 MED ORDER — MAGNESIUM SULFATE 2 GM/50ML IV SOLN: 2.0000 g | Freq: Once | INTRAVENOUS | Status: DC

## 2017-12-27 MED ORDER — CLINDAMYCIN PHOS-BENZOYL PEROX 1.2-2.5 % EX GEL: 1.0000 "application " | Freq: Two times a day (BID) | CUTANEOUS | 2 refills | Status: DC

## 2017-12-27 MED ORDER — HEPARIN SOD (PORK) LOCK FLUSH 100 UNIT/ML IV SOLN
500.0000 [IU] | Freq: Once | INTRAVENOUS | Status: DC | PRN
  Filled 2017-12-27: qty 5

## 2017-12-27 MED ORDER — MAGNESIUM OXIDE 400 (241.3 MG) MG PO TABS: 400.0000 mg | ORAL_TABLET | Freq: Every day | ORAL | 0 refills | Status: DC

## 2017-12-27 MED ORDER — SODIUM CHLORIDE 0.9% FLUSH
10.0000 mL | INTRAVENOUS | Status: DC | PRN
  Administered 2017-12-27: 10 mL via INTRAVENOUS
  Filled 2017-12-27: qty 10

## 2017-12-27 MED ORDER — LEUCOVORIN CALCIUM INJECTION 350 MG
400.0000 mg/m2 | Freq: Once | INTRAVENOUS | Status: AC
  Administered 2017-12-27: 660 mg via INTRAVENOUS
  Filled 2017-12-27: qty 33

## 2017-12-27 MED ORDER — SODIUM CHLORIDE 0.9 % IV SOLN
2400.0000 mg/m2 | INTRAVENOUS | Status: DC
  Administered 2017-12-27: 3950 mg via INTRAVENOUS
  Filled 2017-12-27: qty 79

## 2017-12-27 MED ORDER — POTASSIUM CHLORIDE CRYS ER 20 MEQ PO TBCR: 20.0000 meq | EXTENDED_RELEASE_TABLET | Freq: Two times a day (BID) | ORAL | 2 refills | Status: DC

## 2017-12-27 MED ORDER — SODIUM CHLORIDE 0.9 % IV SOLN
Freq: Once | INTRAVENOUS | Status: AC
  Administered 2017-12-27: 10:00:00 via INTRAVENOUS

## 2017-12-27 NOTE — Progress Notes (Signed)
Received PA request for Clindamycin.  Called [employer](Camille) to initiate PA.  PA approved 12/27/17-12/22/18.  Called [facility](Judy) to advise. She states it went through.

## 2017-12-27 NOTE — Progress Notes (Signed)
Ok to treat with labs from today per [name]. New order received for IV 2G Magnesium to be given during visit today.

## 2017-12-27 NOTE — Patient Instructions (Signed)
Implanted Port Home Guide An implanted port is a type of central line that is placed under the skin. Central lines are used to provide IV access when treatment or nutrition needs to be given through a person's veins. Implanted ports are used for long-term IV access. An implanted port may be placed because:  You need IV medicine that would be irritating to the small veins in your hands or arms.  You need long-term IV medicines, such as antibiotics.  You need IV nutrition for a long period.  You need frequent blood draws for lab tests.  You need dialysis.  Implanted ports are usually placed in the chest area, but they can also be placed in the upper arm, the abdomen, or the leg. An implanted port has two main parts:  Reservoir. The reservoir is round and will appear as a small, raised area under your skin. The reservoir is the part where a needle is inserted to give medicines or draw blood.  Catheter. The catheter is a thin, flexible tube that extends from the reservoir. The catheter is placed into a large vein. Medicine that is inserted into the reservoir goes into the catheter and then into the vein.  How will I care for my incision site? Do not get the incision site wet. Bathe or shower as directed by your health care provider. How is my port accessed? Special steps must be taken to access the port:  Before the port is accessed, a numbing cream can be placed on the skin. This helps numb the skin over the port site.  Your health care provider uses a sterile technique to access the port. ? Your health care provider must put on a mask and sterile gloves. ? The skin over your port is cleaned carefully with an antiseptic and allowed to dry. ? The port is gently pinched between sterile gloves, and a needle is inserted into the port.  Only "non-coring" port needles should be used to access the port. Once the port is accessed, a blood return should be checked. This helps ensure that the port  is in the vein and is not clogged.  If your port needs to remain accessed for a constant infusion, a clear (transparent) bandage will be placed over the needle site. The bandage and needle will need to be changed every week, or as directed by your health care provider.  Keep the bandage covering the needle clean and dry. Do not get it wet. Follow your health care provider's instructions on how to take a shower or bath while the port is accessed.  If your port does not need to stay accessed, no bandage is needed over the port.  What is flushing? Flushing helps keep the port from getting clogged. Follow your health care provider's instructions on how and when to flush the port. Ports are usually flushed with saline solution or a medicine called heparin. The need for flushing will depend on how the port is used.  If the port is used for intermittent medicines or blood draws, the port will need to be flushed: ? After medicines have been given. ? After blood has been drawn. ? As part of routine maintenance.  If a constant infusion is running, the port may not need to be flushed.  How long will my port stay implanted? The port can stay in for as long as your health care provider thinks it is needed. When it is time for the port to come out, surgery will be   done to remove it. The procedure is similar to the one performed when the port was put in. When should I seek immediate medical care? When you have an implanted port, you should seek immediate medical care if:  You notice a bad smell coming from the incision site.  You have swelling, redness, or drainage at the incision site.  You have more swelling or pain at the port site or the surrounding area.  You have a fever that is not controlled with medicine.  This information is not intended to replace advice given to you by your health care provider. Make sure you discuss any questions you have with your health care provider. Document  Released: 12/14/2005 Document Revised: 05/21/2016 Document Reviewed: 08/21/2013 Elsevier Interactive Patient Education  2017 [employer]  

## 2017-12-27 NOTE — Telephone Encounter (Signed)
Scheduled appt per 12/31 los - patient to get an updated schedule in the treatment area.

## 2017-12-27 NOTE — Progress Notes (Signed)
I provided a third complementary case of Ensure Plus.

## 2017-12-27 NOTE — Patient Instructions (Addendum)
[facility] Discharge Instructions for Patients Receiving Chemotherapy  Today you received the following chemotherapy agents: Vectibix, Leucovorin & (5FU) Florouracil  To help prevent nausea and vomiting after your treatment, we encourage you to take your nausea medication as directed  If you develop nausea and vomiting that is not controlled by your nausea medication, call the clinic.   BELOW ARE SYMPTOMS THAT SHOULD BE REPORTED IMMEDIATELY:  *FEVER GREATER THAN 100.5 F  *CHILLS WITH OR WITHOUT FEVER  NAUSEA AND VOMITING THAT IS NOT CONTROLLED WITH YOUR NAUSEA MEDICATION  *UNUSUAL SHORTNESS OF BREATH  *UNUSUAL BRUISING OR BLEEDING  TENDERNESS IN MOUTH AND THROAT WITH OR WITHOUT PRESENCE OF ULCERS  *URINARY PROBLEMS  *BOWEL PROBLEMS  UNUSUAL RASH Items with * indicate a potential emergency and should be followed up as soon as possible.  Feel free to call the clinic you have any questions or concerns. The clinic phone number is [phone].  Magnesium Sulfate injection What is this medicine? MAGNESIUM SULFATE (mag NEE zee um SUL fate) is an electrolyte injection commonly used to treat low magnesium levels in your blood. It is also used to prevent or control seizures in women with preeclampsia or eclampsia. This medicine may be used for other purposes; ask your health care provider or pharmacist if you have questions. What should I tell my health care provider before I take this medicine? They need to know if you have any of these conditions: -heart disease -history of irregular heart beat -kidney disease -an unusual or allergic reaction to magnesium sulfate, medicines, foods, dyes, or preservatives -pregnant or trying to get pregnant -breast-feeding How should I use this medicine? This medicine is for infusion into a vein. It is given by a health care professional in a hospital or clinic setting. Talk to your pediatrician regarding the use of this medicine  in children. While this drug may be prescribed for selected conditions, precautions do apply. Overdosage: If you think you have taken too much of this medicine contact a poison control center or emergency room at once. NOTE: This medicine is only for you. Do not share this medicine with others. What if I miss a dose? This does not apply. What may interact with this medicine? This medicine may interact with the following medications: -certain medicines for anxiety or sleep -certain medicines for seizures like phenobarbital -digoxin -medicines that relax muscles for surgery -narcotic medicines for pain This list may not describe all possible interactions. Give your health care provider a list of all the medicines, herbs, non-prescription drugs, or dietary supplements you use. Also tell them if you smoke, drink alcohol, or use illegal drugs. Some items may interact with your medicine. What should I watch for while using this medicine? Your condition will be monitored carefully while you are receiving this medicine. You may need blood work done while you are receiving this medicine. What side effects may I notice from receiving this medicine? Side effects that you should report to your doctor or health care professional as soon as possible: -allergic reactions like skin rash, itching or hives, swelling of the face, lips, or tongue -facial flushing -muscle weakness -signs and symptoms of low blood pressure like dizziness; feeling faint or lightheaded, falls; unusually weak or tired -signs and symptoms of a dangerous change in heartbeat or heart rhythm like chest pain; dizziness; fast or irregular heartbeat; palpitations; breathing problems -sweating This list may not describe all possible side effects. Call your doctor for medical advice about side effects.  You may report side effects to FDA at 1-800-FDA-1088. Where should I keep my medicine? This drug is given in a hospital or clinic and will  not be stored at home. NOTE: This sheet is a summary. It may not cover all possible information. If you have questions about this medicine, talk to your doctor, pharmacist, or health care provider.  2018 [employer]vier/Gold Standard (2016-07-01 12:31:42)

## 2017-12-29 ENCOUNTER — Ambulatory Visit (HOSPITAL_BASED_OUTPATIENT_CLINIC_OR_DEPARTMENT_OTHER): Payer: Medicaid Other

## 2017-12-29 VITALS — BP 105/70 | HR 99 | Temp 98.9°F | Resp 18

## 2017-12-29 DIAGNOSIS — C189 Malignant neoplasm of colon, unspecified: Secondary | ICD-10-CM

## 2017-12-29 DIAGNOSIS — C186 Malignant neoplasm of descending colon: Secondary | ICD-10-CM | POA: Diagnosis not present

## 2017-12-29 DIAGNOSIS — Z452 Encounter for adjustment and management of vascular access device: Secondary | ICD-10-CM

## 2017-12-29 MED ORDER — SODIUM CHLORIDE 0.9% FLUSH
10.0000 mL | INTRAVENOUS | Status: DC | PRN
  Administered 2017-12-29: 10 mL
  Filled 2017-12-29: qty 10

## 2017-12-29 MED ORDER — HEPARIN SOD (PORK) LOCK FLUSH 100 UNIT/ML IV SOLN
500.0000 [IU] | Freq: Once | INTRAVENOUS | Status: AC | PRN
  Administered 2017-12-29: 500 [IU]
  Filled 2017-12-29: qty 5

## 2017-12-30 ENCOUNTER — Inpatient Hospital Stay (HOSPITAL_COMMUNITY)
Admission: EM | Admit: 2017-12-30 | Discharge: 2018-01-11 | DRG: 271 | Disposition: A | Discharge status: Home Health | Payer: Medicaid Other | Attending: Internal Medicine | Admitting: Internal Medicine

## 2017-12-30 ENCOUNTER — Ambulatory Visit: Payer: Medicaid Other

## 2017-12-30 ENCOUNTER — Emergency Department (HOSPITAL_COMMUNITY): Payer: Medicaid Other

## 2017-12-30 ENCOUNTER — Encounter (HOSPITAL_COMMUNITY): Payer: Self-pay | Admitting: Emergency Medicine

## 2017-12-30 DIAGNOSIS — Z008 Encounter for other general examination: Secondary | ICD-10-CM

## 2017-12-30 DIAGNOSIS — I82220 Acute embolism and thrombosis of inferior vena cava: Secondary | ICD-10-CM | POA: Diagnosis present

## 2017-12-30 DIAGNOSIS — C189 Malignant neoplasm of colon, unspecified: Secondary | ICD-10-CM

## 2017-12-30 DIAGNOSIS — E440 Moderate protein-calorie malnutrition: Secondary | ICD-10-CM | POA: Diagnosis present

## 2017-12-30 DIAGNOSIS — I82409 Acute embolism and thrombosis of unspecified deep veins of unspecified lower extremity: Secondary | ICD-10-CM | POA: Diagnosis present

## 2017-12-30 DIAGNOSIS — I82403 Acute embolism and thrombosis of unspecified deep veins of lower extremity, bilateral: Principal | ICD-10-CM | POA: Diagnosis present

## 2017-12-30 DIAGNOSIS — Z95828 Presence of other vascular implants and grafts: Secondary | ICD-10-CM

## 2017-12-30 DIAGNOSIS — R509 Fever, unspecified: Secondary | ICD-10-CM

## 2017-12-30 DIAGNOSIS — R040 Epistaxis: Secondary | ICD-10-CM | POA: Diagnosis not present

## 2017-12-30 DIAGNOSIS — R600 Localized edema: Secondary | ICD-10-CM

## 2017-12-30 DIAGNOSIS — E876 Hypokalemia: Secondary | ICD-10-CM | POA: Diagnosis present

## 2017-12-30 DIAGNOSIS — D6481 Anemia due to antineoplastic chemotherapy: Secondary | ICD-10-CM | POA: Diagnosis present

## 2017-12-30 DIAGNOSIS — I824Y2 Acute embolism and thrombosis of unspecified deep veins of left proximal lower extremity: Secondary | ICD-10-CM

## 2017-12-30 DIAGNOSIS — C186 Malignant neoplasm of descending colon: Secondary | ICD-10-CM | POA: Diagnosis present

## 2017-12-30 DIAGNOSIS — Z933 Colostomy status: Secondary | ICD-10-CM

## 2017-12-30 DIAGNOSIS — Z79899 Other long term (current) drug therapy: Secondary | ICD-10-CM

## 2017-12-30 DIAGNOSIS — Z809 Family history of malignant neoplasm, unspecified: Secondary | ICD-10-CM

## 2017-12-30 DIAGNOSIS — D630 Anemia in neoplastic disease: Secondary | ICD-10-CM | POA: Diagnosis present

## 2017-12-30 DIAGNOSIS — Z86718 Personal history of other venous thrombosis and embolism: Secondary | ICD-10-CM

## 2017-12-30 DIAGNOSIS — Z9221 Personal history of antineoplastic chemotherapy: Secondary | ICD-10-CM

## 2017-12-30 DIAGNOSIS — I82423 Acute embolism and thrombosis of iliac vein, bilateral: Secondary | ICD-10-CM | POA: Diagnosis present

## 2017-12-30 DIAGNOSIS — Z681 Body mass index (BMI) 19 or less, adult: Secondary | ICD-10-CM

## 2017-12-30 DIAGNOSIS — R64 Cachexia: Secondary | ICD-10-CM | POA: Diagnosis present

## 2017-12-30 DIAGNOSIS — J9690 Respiratory failure, unspecified, unspecified whether with hypoxia or hypercapnia: Secondary | ICD-10-CM

## 2017-12-30 DIAGNOSIS — R5381 Other malaise: Secondary | ICD-10-CM | POA: Diagnosis present

## 2017-12-30 DIAGNOSIS — T451X5A Adverse effect of antineoplastic and immunosuppressive drugs, initial encounter: Secondary | ICD-10-CM | POA: Diagnosis present

## 2017-12-30 DIAGNOSIS — I824Z9 Acute embolism and thrombosis of unspecified deep veins of unspecified distal lower extremity: Secondary | ICD-10-CM

## 2017-12-30 DIAGNOSIS — Z515 Encounter for palliative care: Secondary | ICD-10-CM

## 2017-12-30 DIAGNOSIS — C786 Secondary malignant neoplasm of retroperitoneum and peritoneum: Secondary | ICD-10-CM | POA: Diagnosis present

## 2017-12-30 DIAGNOSIS — R823 Hemoglobinuria: Secondary | ICD-10-CM | POA: Diagnosis not present

## 2017-12-30 DIAGNOSIS — Z7189 Other specified counseling: Secondary | ICD-10-CM

## 2017-12-30 DIAGNOSIS — I739 Peripheral vascular disease, unspecified: Secondary | ICD-10-CM | POA: Diagnosis present

## 2017-12-30 DIAGNOSIS — I959 Hypotension, unspecified: Secondary | ICD-10-CM | POA: Diagnosis not present

## 2017-12-30 DIAGNOSIS — Z85038 Personal history of other malignant neoplasm of large intestine: Secondary | ICD-10-CM

## 2017-12-30 LAB — COMPREHENSIVE METABOLIC PANEL
ALBUMIN: 3.2 g/dL — AB (ref 3.5–5.0)
ALT: 8 U/L — AB (ref 17–63)
AST: 22 U/L (ref 15–41)
Alkaline Phosphatase: 66 U/L (ref 38–126)
Anion gap: 8 (ref 5–15)
BILIRUBIN TOTAL: 0.7 mg/dL (ref 0.3–1.2)
BUN: 9 mg/dL (ref 6–20)
CHLORIDE: 100 mmol/L — AB (ref 101–111)
CO2: 26 mmol/L (ref 22–32)
CREATININE: 0.8 mg/dL (ref 0.61–1.24)
Calcium: 8.2 mg/dL — ABNORMAL LOW (ref 8.9–10.3)
GFR calc Af Amer: >60 mL/min (ref >60)
GLUCOSE: 109 mg/dL — AB (ref 65–99)
POTASSIUM: 3.4 mmol/L — AB (ref 3.5–5.1)
Sodium: 134 mmol/L — ABNORMAL LOW (ref 135–145)
Total Protein: 8.4 g/dL — ABNORMAL HIGH (ref 6.5–8.1)

## 2017-12-30 LAB — URINALYSIS, ROUTINE W REFLEX MICROSCOPIC
BILIRUBIN URINE: NEGATIVE
GLUCOSE, UA: NEGATIVE mg/dL
HGB URINE DIPSTICK: NEGATIVE
KETONES UR: NEGATIVE mg/dL
Leukocytes, UA: NEGATIVE
Nitrite: NEGATIVE
PROTEIN: NEGATIVE mg/dL
Specific Gravity, Urine: >1.046 — ABNORMAL HIGH (ref 1.005–1.030)
pH: 5 (ref 5.0–8.0)

## 2017-12-30 LAB — CBC WITH DIFFERENTIAL/PLATELET
Basophils Absolute: 0.1 10*3/uL (ref 0.0–0.1)
Basophils Relative: 1 %
EOS ABS: 0.1 10*3/uL (ref 0.0–0.7)
EOS PCT: 1 %
HCT: 29.3 % — ABNORMAL LOW (ref 39.0–52.0)
Hemoglobin: 9.4 g/dL — ABNORMAL LOW (ref 13.0–17.0)
LYMPHS ABS: 1.3 10*3/uL (ref 0.7–4.0)
LYMPHS PCT: 20 %
MCH: 26.6 pg (ref 26.0–34.0)
MCHC: 32.1 g/dL (ref 30.0–36.0)
MCV: 83 fL (ref 78.0–100.0)
MONO ABS: 0.4 10*3/uL (ref 0.1–1.0)
Monocytes Relative: 7 %
Neutro Abs: 4.6 10*3/uL (ref 1.7–7.7)
Neutrophils Relative %: 71 %
PLATELETS: 156 10*3/uL (ref 150–400)
RBC: 3.53 MIL/uL — ABNORMAL LOW (ref 4.22–5.81)
RDW: 17.7 % — AB (ref 11.5–15.5)
WBC: 6.4 10*3/uL (ref 4.0–10.5)

## 2017-12-30 MED ORDER — OXYCODONE HCL 5 MG PO TABS
5.0000 mg | ORAL_TABLET | Freq: Once | ORAL | Status: AC
  Administered 2017-12-31: 5 mg via ORAL
  Filled 2017-12-30: qty 1

## 2017-12-30 MED ORDER — IOPAMIDOL (ISOVUE-300) INJECTION 61%
INTRAVENOUS | Status: AC
  Filled 2017-12-30: qty 100

## 2017-12-30 MED ORDER — OXYCODONE HCL 5 MG PO TABS
5.0000 mg | ORAL_TABLET | Freq: Once | ORAL | Status: AC
  Administered 2017-12-30: 5 mg via ORAL
  Filled 2017-12-30: qty 1

## 2017-12-30 MED ORDER — IOPAMIDOL (ISOVUE-300) INJECTION 61%
100.0000 mL | Freq: Once | INTRAVENOUS | Status: AC | PRN
  Administered 2017-12-30: 100 mL via INTRAVENOUS
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
## 2017-12-30 NOTE — ED Notes (Signed)
Repaged vascular at 1919

## 2017-12-30 NOTE — ED Triage Notes (Signed)
Patient c/o left leg swelling for couple days. Patient is getting chemo for abd cancer

## 2017-12-30 NOTE — ED Notes (Addendum)
Paged vascular tech to [phone].

## 2017-12-30 NOTE — ED Notes (Signed)
Repaged vascular [place] [facility] notified

## 2017-12-30 NOTE — ED Notes (Signed)
Pt aware we need a urine sample. Urinal at bedside.

## 2017-12-30 NOTE — ED Notes (Addendum)
Paged vascular for the 3rd time 1905

## 2017-12-31 ENCOUNTER — Emergency Department (HOSPITAL_BASED_OUTPATIENT_CLINIC_OR_DEPARTMENT_OTHER): Admit: 2017-12-31 | Discharge: 2017-12-31 | Disposition: A | Discharge status: Home / Self Care | Payer: Medicaid Other

## 2017-12-31 ENCOUNTER — Other Ambulatory Visit: Payer: Self-pay

## 2017-12-31 ENCOUNTER — Encounter (HOSPITAL_COMMUNITY): Payer: Self-pay | Admitting: Orthopedic Surgery

## 2017-12-31 DIAGNOSIS — M7989 Other specified soft tissue disorders: Secondary | ICD-10-CM | POA: Diagnosis not present

## 2017-12-31 DIAGNOSIS — I82409 Acute embolism and thrombosis of unspecified deep veins of unspecified lower extremity: Secondary | ICD-10-CM | POA: Diagnosis present

## 2017-12-31 DIAGNOSIS — I824Y2 Acute embolism and thrombosis of unspecified deep veins of left proximal lower extremity: Secondary | ICD-10-CM | POA: Insufficient documentation

## 2017-12-31 MED ORDER — OXYCODONE HCL 5 MG PO TABS
10.0000 mg | ORAL_TABLET | Freq: Four times a day (QID) | ORAL | Status: DC | PRN
  Administered 2017-12-31 – 2018-01-04 (×10): 10 mg via ORAL
  Filled 2017-12-31 (×11): qty 2

## 2017-12-31 MED ORDER — GABAPENTIN 100 MG PO CAPS
100.0000 mg | ORAL_CAPSULE | Freq: Three times a day (TID) | ORAL | Status: DC
  Administered 2017-12-31 – 2018-01-06 (×19): 100 mg via ORAL
  Filled 2017-12-31 (×19): qty 1

## 2017-12-31 MED ORDER — ONDANSETRON HCL 4 MG/2ML IJ SOLN
4.0000 mg | Freq: Once | INTRAMUSCULAR | Status: AC
Start: 2017-12-31 — End: 2017-12-31
  Administered 2017-12-31: 4 mg via INTRAVENOUS
  Filled 2017-12-31: qty 2

## 2017-12-31 MED ORDER — POTASSIUM CHLORIDE CRYS ER 20 MEQ PO TBCR
40.0000 meq | EXTENDED_RELEASE_TABLET | Freq: Once | ORAL | Status: AC
  Administered 2017-12-31: 14:00:00 40 meq via ORAL
  Filled 2017-12-31: qty 2

## 2017-12-31 MED ORDER — HYDROMORPHONE HCL 1 MG/ML IJ SOLN
1.0000 mg | Freq: Once | INTRAMUSCULAR | Status: AC
Start: 2017-12-31 — End: 2017-12-31
  Administered 2017-12-31: 1 mg via INTRAVENOUS
  Filled 2017-12-31: qty 1

## 2017-12-31 MED ORDER — DIPHENOXYLATE-ATROPINE 2.5-0.025 MG PO TABS: 1.0000 | ORAL_TABLET | Freq: Four times a day (QID) | ORAL | Status: DC | PRN

## 2017-12-31 MED ORDER — APIXABAN 5 MG PO TABS
5.0000 mg | ORAL_TABLET | Freq: Two times a day (BID) | ORAL | Status: DC
  Administered 2017-12-31 (×2): 5 mg via ORAL
  Filled 2017-12-31 (×3): qty 1

## 2017-12-31 MED ORDER — ENSURE ENLIVE PO LIQD
120.0000 mL | Freq: Three times a day (TID) | ORAL | Status: DC
  Administered 2017-12-31 – 2018-01-10 (×23): 120 mL via ORAL
  Filled 2017-12-31 (×2): qty 237

## 2017-12-31 MED ORDER — ACETAMINOPHEN 325 MG PO TABS
650.0000 mg | ORAL_TABLET | Freq: Four times a day (QID) | ORAL | Status: DC | PRN
  Administered 2017-12-31: 650 mg via ORAL
  Filled 2017-12-31: qty 2

## 2017-12-31 MED ORDER — ONDANSETRON HCL 4 MG/2ML IJ SOLN
4.0000 mg | Freq: Four times a day (QID) | INTRAMUSCULAR | Status: DC | PRN
Start: 2017-12-31 — End: 2018-01-11

## 2017-12-31 MED ORDER — DOCUSATE SODIUM 100 MG PO CAPS
100.0000 mg | ORAL_CAPSULE | Freq: Two times a day (BID) | ORAL | Status: DC
  Administered 2017-12-31 – 2018-01-06 (×13): 100 mg via ORAL
  Filled 2017-12-31 (×14): qty 1

## 2017-12-31 MED ORDER — MAGNESIUM SULFATE 2 GM/50ML IV SOLN
2.0000 g | Freq: Once | INTRAVENOUS | Status: AC
  Administered 2017-12-31: 14:00:00 2 g via INTRAVENOUS
  Filled 2017-12-31: qty 50

## 2017-12-31 MED ORDER — HYDROMORPHONE HCL 1 MG/ML IJ SOLN
1.0000 mg | INTRAMUSCULAR | Status: DC | PRN
  Administered 2018-01-02 – 2018-01-11 (×16): 1 mg via INTRAVENOUS
  Filled 2017-12-31 (×17): qty 1

## 2017-12-31 NOTE — Discharge Planning (Signed)
[place] J. Clydene Laming, RN, BSN, [employer] [phone] Spoke with pt daughter via telephone regarding discharge planning for [employer]. Offered pt list of home health agencies to choose from.  Pt chose [facility] to render services. Colman Cater of [facility] notified. Patient made aware that [facility] will be in contact in 24-48 hours.  No DME needs identified at this time.

## 2017-12-31 NOTE — Clinical Social Work Note (Signed)
Clinical Social Work Assessment  Patient Details  Name: Jeff [name] MRN: [id number] Date of Birth: 11-14-1961  Date of referral:  12/31/17               Reason for consult:  Facility Placement(or home health needs. )                Permission sought to share information with:  Family Supports Permission granted to share information::  Yes, Verbal Permission Granted  Name::     [name]   Agency::     Relationship::  daughter   Contact Information:     Housing/Transportation Living arrangements for the past 2 months:  Single Family Home(pt reports living alone. ) Source of Information:  Patient Patient Interpreter Needed:  None Criminal Activity/Legal Involvement Pertinent to Current Situation/Hospitalization:  No - Comment as needed Significant Relationships:  Adult Children Lives with:  Self Do you feel safe going back to the place where you live?  Yes Need for family participation in patient care:  Yes (Comment)  Care giving concerns:  CSW spoke with pt and daughter via phone. At this time daughter is concerned about pt not being able to walk as pt once was able to ambulate with no problems.    Social Worker assessment / plan:  CSW spoke with pt and daughter via phone. CSW was informed that pt lives alone at home but does have a daughter [name]) that comes and check son pt regularly. CSW was informed by daughter that she is a single parent and works, but tried her best to take care of pt. Pt informed CSW that pt gets chemo at the [facility]. Pt reports that pt has no further help in the home and would like some rather than being placed in a facility at this time. CSW spoke with pt's daughter as asked by pt and was informed that pt is slowly declining and daughter just wants pt to be comfortable whether at home or in a SNF. CSW informed daughter that CSW and RNCM would follow up with her once more information has been gathered.   Employment status:  Other  Network engineer) Insurance information:  Medicaid In [place] PT Recommendations:  Not assessed at this time Information / Referral to community resources:     Patient/Family's Response to care:  Pt appears to be understanding and agreeable to plan of care at this time. Pt expressed that pt would like more help within the home as pt once had and aid after coming home from a facility but only was there for 30 days. CSW to speak with RNCM about HH options as needed.   Patient/Family's Understanding of and Emotional Response to Diagnosis, Current Treatment, and Prognosis:  At this time no further questions or concerns have been presented to CSW.   Emotional Assessment Appearance:    Attitude/Demeanor/Rapport:    Affect (typically observed):  Pleasant Orientation:  Oriented to Situation, Oriented to Self, Oriented to Place, Oriented to  Time Alcohol / Substance use:  Not Applicable Psych involvement (Current and /or in the community):  No (Comment)  Discharge Needs  Concerns to be addressed:  Care Coordination, Adjustment to Illness, Lack of Support Readmission within the last 30 days:  No Current discharge risk:  Lives alone Barriers to Discharge:  No Barriers Identified   Wetzel Bjornstad, [place] 12/31/2017, 8:13 AM

## 2017-12-31 NOTE — Progress Notes (Signed)
OT Cancellation Note  Patient Details Name: Jeff [name] MRN: [id number] [name]: 09-08-1961   Cancelled Treatment:    Reason Eval/Treat Not Completed: Patient not medically ready; Pt currently with bilateral DVT in LE's, will check back as Pt is medically ready and as schedule permits.  Lou Cal, OT Pager [phone] 12/31/2017   Jeff [name] 12/31/2017, 10:04 AM
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
## 2017-12-31 NOTE — Progress Notes (Signed)
Palliative Medicine consult noted. Due to high referral volume, there may be a delay seeing this patient. Please call the Palliative Medicine Team office at [phone] if recommendations are needed in the interim.  Thank you for inviting [place] to see this patient.  Marjie Skiff Tarisa Paola, RN, BSN, [facility] 12/31/2017 2:10 PM Office [phone]

## 2017-12-31 NOTE — Discharge Planning (Signed)
[facility] consulted regarding home health services.  Pt may be more appropriate for home hospice/palliative care.  Will await results of Palliative Care consult.

## 2017-12-31 NOTE — ED Provider Notes (Signed)
I went and spoke with the patient and his biggest complaint is morbidity related to the pain in his leg from known DVT which is worsening.  Patient is very hesitant to start anticoagulation due to severe GI bleed in August.  Spoke with [name] his oncologist who is willing to come and speak with the patient.  She also states he lives alone with some peripheral involvement of his daughter.  She felt that he does need better pain control but also would benefit from home health services.  Discussed this with case management who can also set up the services. Patient given a dose of Dilaudid and will ensure he is getting his home medications.  Appreciate Dr. Lewayne Bunting recommendations  10:08 AM [name] evaluated the patient and recommended starting Eliquis.  Patient is not comfortable going home given all the bleeding he had when being on blood thinner in the past.  He is still having significant leg pain.  If patient is being hospitalized oncology recommended starting the first few doses of Eliquis at 5 mg twice daily but then reducing to 2.5 mg twice daily at home hopefully to lessen the risk of bleeding.  Will discuss with hospitalist.   Blanchie Dessert, MD 12/31/17 1009

## 2017-12-31 NOTE — ED Notes (Signed)
Report given to floor RN

## 2017-12-31 NOTE — Progress Notes (Signed)
BLE venous duplex prelim: Right- age indeterminate DVT of the common femoral, femoral, popliteal, gastroc, posterior tibial, and peroneal veins. Left- acute DVT of the common femoral, femoral, popliteal, gastroc, posterior tibial, and peroneal veins. Landry Mellow, RDMS, RVT   Order placed at 6:38 pm yesterday. On call tech was not paged, (protocol to call [facility] operator to page 4:30- 7pm for vascular). Otherwise test would have been completed yesterday.
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
## 2017-12-31 NOTE — Progress Notes (Signed)
PT Cancellation Note  Patient Details Name: Jeff [name] MRN: [id number] [name]: January 07, 1961   Cancelled Treatment:    Reason Eval/Treat Not Completed: Medical issues which prohibited therapy, Bilateral DVT in LE's and patient declines due to pain.   Claretha Cooper 12/31/2017, 9:55 AM Tresa Endo PT [phone]

## 2017-12-31 NOTE — ED Notes (Signed)
Dr. Roxanne Mins informed of pt's low grade temp.  No new orders received.

## 2018-01-01 ENCOUNTER — Encounter (HOSPITAL_COMMUNITY): Payer: Self-pay | Admitting: General Surgery

## 2018-01-01 DIAGNOSIS — R319 Hematuria, unspecified: Secondary | ICD-10-CM | POA: Diagnosis not present

## 2018-01-01 DIAGNOSIS — Z79899 Other long term (current) drug therapy: Secondary | ICD-10-CM | POA: Diagnosis not present

## 2018-01-01 DIAGNOSIS — Z95828 Presence of other vascular implants and grafts: Secondary | ICD-10-CM | POA: Diagnosis not present

## 2018-01-01 DIAGNOSIS — E876 Hypokalemia: Secondary | ICD-10-CM | POA: Diagnosis present

## 2018-01-01 DIAGNOSIS — I82422 Acute embolism and thrombosis of left iliac vein: Secondary | ICD-10-CM

## 2018-01-01 DIAGNOSIS — K922 Gastrointestinal hemorrhage, unspecified: Secondary | ICD-10-CM | POA: Diagnosis not present

## 2018-01-01 DIAGNOSIS — Z7189 Other specified counseling: Secondary | ICD-10-CM | POA: Diagnosis not present

## 2018-01-01 DIAGNOSIS — I82412 Acute embolism and thrombosis of left femoral vein: Secondary | ICD-10-CM

## 2018-01-01 DIAGNOSIS — I824Y2 Acute embolism and thrombosis of unspecified deep veins of left proximal lower extremity: Secondary | ICD-10-CM | POA: Diagnosis not present

## 2018-01-01 DIAGNOSIS — I824Z9 Acute embolism and thrombosis of unspecified deep veins of unspecified distal lower extremity: Secondary | ICD-10-CM | POA: Diagnosis not present

## 2018-01-01 DIAGNOSIS — I824Z2 Acute embolism and thrombosis of unspecified deep veins of left distal lower extremity: Secondary | ICD-10-CM | POA: Diagnosis not present

## 2018-01-01 DIAGNOSIS — Z86718 Personal history of other venous thrombosis and embolism: Secondary | ICD-10-CM | POA: Diagnosis not present

## 2018-01-01 DIAGNOSIS — D6481 Anemia due to antineoplastic chemotherapy: Secondary | ICD-10-CM | POA: Diagnosis present

## 2018-01-01 DIAGNOSIS — Z681 Body mass index (BMI) 19 or less, adult: Secondary | ICD-10-CM | POA: Diagnosis not present

## 2018-01-01 DIAGNOSIS — D630 Anemia in neoplastic disease: Secondary | ICD-10-CM

## 2018-01-01 DIAGNOSIS — R600 Localized edema: Secondary | ICD-10-CM | POA: Diagnosis present

## 2018-01-01 DIAGNOSIS — Z85038 Personal history of other malignant neoplasm of large intestine: Secondary | ICD-10-CM | POA: Diagnosis not present

## 2018-01-01 DIAGNOSIS — I82220 Acute embolism and thrombosis of inferior vena cava: Secondary | ICD-10-CM | POA: Diagnosis present

## 2018-01-01 DIAGNOSIS — Z809 Family history of malignant neoplasm, unspecified: Secondary | ICD-10-CM | POA: Diagnosis not present

## 2018-01-01 DIAGNOSIS — C786 Secondary malignant neoplasm of retroperitoneum and peritoneum: Secondary | ICD-10-CM | POA: Diagnosis present

## 2018-01-01 DIAGNOSIS — R4587 Impulsiveness: Secondary | ICD-10-CM | POA: Diagnosis not present

## 2018-01-01 DIAGNOSIS — F1010 Alcohol abuse, uncomplicated: Secondary | ICD-10-CM | POA: Diagnosis not present

## 2018-01-01 DIAGNOSIS — I959 Hypotension, unspecified: Secondary | ICD-10-CM | POA: Diagnosis not present

## 2018-01-01 DIAGNOSIS — Z008 Encounter for other general examination: Secondary | ICD-10-CM | POA: Diagnosis not present

## 2018-01-01 DIAGNOSIS — C189 Malignant neoplasm of colon, unspecified: Secondary | ICD-10-CM | POA: Diagnosis not present

## 2018-01-01 DIAGNOSIS — R5081 Fever presenting with conditions classified elsewhere: Secondary | ICD-10-CM | POA: Diagnosis not present

## 2018-01-01 DIAGNOSIS — Z9221 Personal history of antineoplastic chemotherapy: Secondary | ICD-10-CM | POA: Diagnosis not present

## 2018-01-01 DIAGNOSIS — I82423 Acute embolism and thrombosis of iliac vein, bilateral: Secondary | ICD-10-CM | POA: Diagnosis present

## 2018-01-01 DIAGNOSIS — I82403 Acute embolism and thrombosis of unspecified deep veins of lower extremity, bilateral: Secondary | ICD-10-CM | POA: Diagnosis present

## 2018-01-01 DIAGNOSIS — E440 Moderate protein-calorie malnutrition: Secondary | ICD-10-CM | POA: Diagnosis present

## 2018-01-01 DIAGNOSIS — J9601 Acute respiratory failure with hypoxia: Secondary | ICD-10-CM | POA: Diagnosis not present

## 2018-01-01 DIAGNOSIS — Z515 Encounter for palliative care: Secondary | ICD-10-CM | POA: Diagnosis not present

## 2018-01-01 DIAGNOSIS — I739 Peripheral vascular disease, unspecified: Secondary | ICD-10-CM | POA: Diagnosis present

## 2018-01-01 DIAGNOSIS — R5381 Other malaise: Secondary | ICD-10-CM | POA: Diagnosis present

## 2018-01-01 DIAGNOSIS — C186 Malignant neoplasm of descending colon: Secondary | ICD-10-CM | POA: Diagnosis present

## 2018-01-01 DIAGNOSIS — R823 Hemoglobinuria: Secondary | ICD-10-CM | POA: Diagnosis not present

## 2018-01-01 DIAGNOSIS — R64 Cachexia: Secondary | ICD-10-CM | POA: Diagnosis present

## 2018-01-01 DIAGNOSIS — R040 Epistaxis: Secondary | ICD-10-CM | POA: Diagnosis not present

## 2018-01-01 DIAGNOSIS — Z933 Colostomy status: Secondary | ICD-10-CM | POA: Diagnosis not present

## 2018-01-01 LAB — BASIC METABOLIC PANEL
Anion gap: 6 (ref 5–15)
BUN: 10 mg/dL (ref 6–20)
CHLORIDE: 101 mmol/L (ref 101–111)
CO2: 28 mmol/L (ref 22–32)
CREATININE: 0.78 mg/dL (ref 0.61–1.24)
Calcium: 8.2 mg/dL — ABNORMAL LOW (ref 8.9–10.3)
GFR calc Af Amer: >60 mL/min (ref >60)
GFR calc non Af Amer: >60 mL/min (ref >60)
GLUCOSE: 106 mg/dL — AB (ref 65–99)
Potassium: 4 mmol/L (ref 3.5–5.1)
SODIUM: 135 mmol/L (ref 135–145)

## 2018-01-01 LAB — APTT: aPTT: 43 seconds — ABNORMAL HIGH (ref 24–36)

## 2018-01-01 LAB — CBC
HCT: 26.5 % — ABNORMAL LOW (ref 39.0–52.0)
Hemoglobin: 8.5 g/dL — ABNORMAL LOW (ref 13.0–17.0)
MCH: 26.7 pg (ref 26.0–34.0)
MCHC: 32.1 g/dL (ref 30.0–36.0)
MCV: 83.3 fL (ref 78.0–100.0)
Platelets: 116 10*3/uL — ABNORMAL LOW (ref 150–400)
RBC: 3.18 MIL/uL — ABNORMAL LOW (ref 4.22–5.81)
RDW: 17.5 % — AB (ref 11.5–15.5)
WBC: 4.9 10*3/uL (ref 4.0–10.5)

## 2018-01-01 LAB — URINE CULTURE: Culture: NO GROWTH

## 2018-01-01 LAB — MAGNESIUM: Magnesium: 1.1 mg/dL — ABNORMAL LOW (ref 1.7–2.4)

## 2018-01-01 MED ORDER — HEPARIN (PORCINE) IN NACL 100-0.45 UNIT/ML-% IJ SOLN
850.0000 [IU]/h | INTRAMUSCULAR | Status: DC
  Filled 2018-01-01: qty 250

## 2018-01-01 MED ORDER — HEPARIN (PORCINE) IN NACL 100-0.45 UNIT/ML-% IJ SOLN
750.0000 [IU]/h | INTRAMUSCULAR | Status: DC
  Administered 2018-01-01: 750 [IU]/h via INTRAVENOUS
  Filled 2018-01-01: qty 250

## 2018-01-01 MED ORDER — SODIUM CHLORIDE 0.9% FLUSH: 10.0000 mL | INTRAVENOUS | Status: DC | PRN

## 2018-01-01 MED ORDER — MIRTAZAPINE 15 MG PO TBDP
15.0000 mg | ORAL_TABLET | Freq: Every day | ORAL | Status: DC
  Administered 2018-01-01 – 2018-01-05 (×5): 15 mg via ORAL
  Filled 2018-01-01 (×8): qty 1

## 2018-01-01 NOTE — Progress Notes (Signed)
Pt reports nosebleed and states that he will refuse his eliquis in the am; and would like to speak with MD about plan of care as soon as possible

## 2018-01-01 NOTE — Progress Notes (Signed)
Patient Demographics:    Jeff [name], is a 57 y.o. male, [name] - 01/03/1961, INO:[id number]  Admit date - 12/30/2017   Admitting Physician Theodis Blaze, MD  Outpatient Primary MD for the patient is [name], MD  LOS - 0   Chief Complaint  Patient presents with  . Leg Swelling        Subjective:    Jeff [name] today has no fevers, no emesis,  No chest pain,  Poor appetite, Lt leg pain   Assessment  & Plan :    Active Problems:   DVT (deep venous thrombosis) (HCC)  Brief summary:- 57 y.o. 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   Plan:- 1) Left low extremity DVT, acute, CT scan showed extensive thrombosis in the IVC, common iliac, left iliac vein and femoral vein-  prior history of GI bleeding from his left colon mass when he was on maintenance Xarelto, he is at high risk for recurrent bleeding from anticoagulation, but patient left lower extremity DVT is very symptomatic and painful, patient had nosebleeds on 01/01/2018 after starting Eliquis on 12/31/2017, Dr Burr Medico advised IV heparin, patient is agreeable, Dr. Laurence Ferrari from IR will consider thrombectomy with a device, and he prefer pt to stay on full dose anticoagulation for 4-6 weeks before we switch to prophylactic dose anticoagulation.  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, on palliative chemotherapy 5-FU and Panitumumab-last chemo session 11/27/2017, follows with Dr Burr Medico  3)Acute on chronic chronic anemia of chronic disease-hemoglobin is down to 8.5, in the setting of underlying colon cancer and  chemotherapy anemia of neoplasm, and anemia secondary to chemotherapy, high risk for bleeding and further blood loss and on anticoagulation watch closely and transfuse as clinically indicated  4)Moderate Protein and calorie malnutrition with Anorexia-supplements advised dietitian consult requested, start Remeron for appetite stimulation   5)Social/Ethics-patient is a full code, palliative care consult pending  6)FEN-hypokalemia/hypomagnesemia-continue to replace and recheck  Disposition Plan  : pt lived alone PTA  Consults  :  Palliative/IR/Oncology  DVT Prophylaxis  :  iv Heparin   Lab Results  Component Value Date   PLT 116 (L) 01/01/2018    Inpatient Medications  Scheduled Meds: . docusate sodium  100 mg Oral BID  . feeding supplement (ENSURE ENLIVE)  120 mL Oral TID  . gabapentin  100 mg Oral TID   Continuous Infusions: . heparin 750 Units/hr (01/01/18 1207)   PRN Meds:.acetaminophen, HYDROmorphone (DILAUDID) injection, ondansetron, oxyCODONE    Anti-infectives (From admission, onward)   None        Objective:   Vitals:   12/31/17 2144 01/01/18 0150 01/01/18 0531 01/01/18 1324  BP: 100/66  103/61 98/67  Pulse: (!) 110  90 95  Resp: 16  16 18   Temp: (!) 100.6 F (38.1 C) 98.8 F (37.1 C) 98.2 F (36.8 C) 99.7 F (37.6 C)  TempSrc: Oral Oral Oral Oral  SpO2: 100%  100% 100%  Weight:  Height:        Wt Readings from Last 3 Encounters:  12/31/17 59.4 kg (131 lb)  12/27/17 59.7 kg (131 lb 11.2 oz)  12/13/17 57.5 kg (126 lb 12.8 oz)     Intake/Output Summary (Last 24 hours) at 01/01/2018 1546 Last data filed at 01/01/2018 1325 Gross per 24 hour  Intake 600 ml  Output 425 ml  Net 175 ml     Physical Exam  Gen:- Awake Alert,  In no apparent distress  HEENT:- [place].AT, No sclera icterus Neck-Supple Neck,No JVD,.  Lungs-  CTAB  CV- S1, S2 normal Abd-  +ve B.Sounds, Abd Soft, No tenderness, colostomy Extremity/Skin:- Lt LE warmth, swelling,  tenderness, extending from the upper thigh all the way into the foot Psych-affect is flat    Data Review:   Micro Results Recent Results (from the past 240 hour(s))  Urine culture     Status: None   Collection Time: 12/30/17  8:50 PM  Result Value Ref Range Status   Specimen Description URINE, CLEAN CATCH  Final   Special Requests NONE  Final   Culture   Final    NO GROWTH Performed at [facility], [place] [address], [name], [place] [address]    Report Status 01/01/2018 FINAL  Final    Radiology Reports Ct Abdomen Pelvis W Contrast  Result Date: 12/30/2017 CLINICAL DATA:  57 year old male with abdominal, left pelvic and left leg pain and swelling for several days. History of stage IV colon cancer and currently on chemotherapy. EXAM: CT ABDOMEN AND PELVIS WITH CONTRAST TECHNIQUE: Multidetector CT imaging of the abdomen and pelvis was performed using the standard protocol following bolus administration of intravenous contrast. CONTRAST:  121mL ISOVUE-300 IOPAMIDOL (ISOVUE-300) INJECTION 61% COMPARISON:  11/25/2017 CT and prior studies FINDINGS: Lower chest: No acute abnormality. Hepatobiliary: No significant hepatic or gallbladder abnormalities. No biliary dilatation. Pancreas: Unremarkable Spleen: A 4.2 x 5.9 cm complex cystic mass in the splenic hilum is unchanged. No new abnormalities identified. Adrenals/Urinary Tract: The kidneys, adrenal glands and bladder are unremarkable except for mild circumferential bladder wall thickening. 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 Wall thickening of the colon at the splenic flexure is unchanged. No bowel obstruction, other definite bowel wall thickening or inflammatory changes. Vascular/Lymphatic: An infrarenal IVC filter is noted with thrombus in the IVC, common iliac veins, left iliac and femoral veins. There is enlargement and inflammation along the left iliac and common femoral vein since the prior study. Aortic atherosclerotic  calcifications noted without aneurysm. No definite enlarged lymph nodes. Reproductive: No definite prostate abnormality Other: Stranding in the subcutaneous and mesenteric fat noted. A trace amount of free fluid within the pelvis is noted. No pneumoperitoneum or definite abscess. Musculoskeletal: No acute abnormality or suspicious bony lesion. IMPRESSION: 1. 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 However, there is now increasing thrombus within the left iliac and femoral veins with adjacent inflammation. 2. Mild circumferential bladder wall thickening which may be reactive or could indicate cystitis. Correlate clinically. 3. Unchanged complex cystic mass in the splenic hilum and focal wall thickening of the splenic flexure. 4.  Aortic Atherosclerosis (ICD10-I70.0). Electronically Signed   By: Margarette Canada M.D.   On: 12/30/2017 20:36     CBC Recent Labs  Lab 12/27/17 0820 12/30/17 1907 01/01/18 0528  WBC 6.2 6.4 4.9  HGB 9.4* 9.4* 8.5*  HCT 29.0* 29.3* 26.5*  PLT 126* 156 116*  MCV 84.3 83.0 83.3  MCH 27.3 26.6 26.7  MCHC 32.4 32.1 32.1  RDW 18.1* 17.7* 17.5*  LYMPHSABS 1.2 1.3  --   MONOABS 0.8 0.4  --   EOSABS 0.1 0.1  --   BASOSABS 0.1 0.1  --     Chemistries  Recent Labs  Lab 12/27/17 0820 12/30/17 1907 01/01/18 0528  NA 134* 134* 135  K 3.2* 3.4* 4.0  CL  --  100* 101  CO2 23 26 28   GLUCOSE 135 109* 106*  BUN 11.3 9 10   CREATININE 0.9 0.80 0.78  CALCIUM 8.3* 8.2* 8.2*  MG 1.0*  --  1.1*  AST 16 22  --   ALT 6 8*  --   ALKPHOS 69 66  --   BILITOT 0.52 0.7  --    ------------------------------------------------------------------------------------------------------------------ No results for input(s): CHOL, HDL, LDLCALC, TRIG, CHOLHDL, LDLDIRECT in the last 72 hours.  No results found for: HGBA1C ------------------------------------------------------------------------------------------------------------------ No results for input(s): TSH, T4TOTAL, T3FREE,  THYROIDAB in the last 72 hours.  Invalid input(s): FREET3 ------------------------------------------------------------------------------------------------------------------ No results for input(s): VITAMINB12, FOLATE, FERRITIN, TIBC, IRON, RETICCTPCT in the last 72 hours.  Coagulation profile No results for input(s): INR, PROTIME in the last 168 hours.  No results for input(s): DDIMER in the last 72 hours.  Cardiac Enzymes No results for input(s): CKMB, TROPONINI, MYOGLOBIN in the last 168 hours.  Invalid input(s): CK ------------------------------------------------------------------------------------------------------------------ No results found for: BNP   Roxan Hockey M.D on 01/01/2018 at 3:46 PM  Between 7am to 7pm - Pager - [phone]  After 7pm go to [email or web address] - password [email or web address]  [facility] -  Office  [phone]   Voice Recognition Viviann Spare dictation system was used to create this note, attempts have been made to correct errors. Please contact the author with questions and/or clarifications.

## 2018-01-01 NOTE — Progress Notes (Signed)
ANTICOAGULATION CONSULT NOTE - Follow Up Consult  Please see progress note from Leodis Sias, PharmD for full details.  Briefly, on IV heparin 750 units/hr for (+) DVT.  APTT = 46 seconds which is subtherapeutic.  No new bleeding issues per discussion with RN.  Plan: - Increase heparin to 850 units/hr - Recheck aPTT and heparin level in 8hrs - Daily CBC - Monitor closely for signs/symptoms of bleeding  Peggyann Juba, PharmD, BCPS Pager: [phone] 01/01/2018 9:09 PM

## 2018-01-01 NOTE — Progress Notes (Addendum)
ANTICOAGULATION CONSULT NOTE - Initial Consult  Pharmacy Consult for heparin Indication: DVT  No Known Allergies  Patient Measurements: Height: 5\' 9"  (175.3 cm) Weight: 131 lb (59.4 kg) IBW/kg (Calculated) : 70.7 Heparin Dosing Weight: 59.4 kg  Vital Signs: Temp: 98.2 F (36.8 C) (01/05 0531) Temp Source: Oral (01/05 0531) BP: 103/61 (01/05 0531) Pulse Rate: 90 (01/05 0531)  Labs: Recent Labs    12/30/17 1907 01/01/18 0528  HGB 9.4* 8.5*  HCT 29.3* 26.5*  PLT 156 116*  CREATININE 0.80 0.78    Estimated Creatinine Clearance: 84.6 mL/min (by C-G formula based on SCr of 0.78 mg/dL).   Medical History: Past Medical History:  Diagnosis Date  . Acute deep vein thrombosis (DVT) of right lower extremity ([place]) 02/18/2017  . Malignant neoplasm of abdomen ([place])    [name] 01/11/2017  . Microcytic anemia    /notes 01/11/2017    Medications:  Medications Prior to Admission  Medication Sig Dispense Refill Last Dose  . diphenoxylate-atropine (LOMOTIL) 2.5-0.025 MG tablet Take 1-2 tablets by mouth 4 (four) times daily as needed for diarrhea or loose stools. 90 tablet 2 12/30/2017 at Unknown time  . docusate sodium (COLACE) 100 MG capsule Take 100 mg by mouth 2 (two) times daily.   12/29/2017 at Unknown time  . gabapentin (NEURONTIN) 100 MG capsule Take 1 capsule (100 mg total) 3 (three) times daily by mouth. 90 capsule 0 12/30/2017 at Unknown time  . Nutritional Supplements (NUTRITIONAL SUPPLEMENT PO) House Supplement - give 120 ml by mouth three times daily between meals for supplement   12/30/2017 at Unknown time  . Oxycodone HCl 10 MG TABS Take 1 tablet (10 mg total) by mouth every 6 (six) hours as needed. 90 tablet 0 12/30/2017 at Unknown time  . Clindamycin Phos-Benzoyl Perox gel Apply 1 application topically 2 (two) times daily. (Patient not taking: Reported on 12/30/2017) 50 g 2 Not Taking at Unknown time  . ferrous sulfate 325 (65 FE) MG tablet Take 1 tablet (325 mg total) by mouth 2  (two) times daily with a meal. (Patient not taking: Reported on 12/27/2017) 60 tablet 0 Not Taking at Unknown time  . hydrocortisone 2.5 % cream Apply topically 2 (two) times daily. (Patient not taking: Reported on 12/30/2017) 30 g 3 Not Taking at Unknown time  . loperamide (IMODIUM A-D) 2 MG tablet Take 1 tablet (2 mg total) by mouth 4 (four) times daily as needed for diarrhea or loose stools. (Patient not taking: Reported on 12/30/2017) 30 tablet 2 Not Taking at Unknown time  . magnesium oxide (MAG-OX) 400 (241.3 Mg) MG tablet Take 1 tablet (400 mg total) by mouth 2 (two) times daily. (Patient not taking: Reported on 12/30/2017) 60 tablet 1 Not Taking at Unknown time  . magnesium oxide (MAG-OX) 400 (241.3 Mg) MG tablet Take 1 tablet (400 mg total) by mouth daily. (Patient not taking: Reported on 12/30/2017) 60 tablet 0 Not Taking at Unknown time  . mirtazapine (REMERON) 15 MG tablet Take 1 tablet (15 mg total) by mouth at bedtime. (Patient not taking: Reported on 12/27/2017) 30 tablet 2 Not Taking at Unknown time  . ondansetron (ZOFRAN) 8 MG tablet Take 1 tablet (8 mg total) by mouth 2 (two) times daily as needed for refractory nausea / vomiting. Start on day 3 after chemotherapy. (Patient not taking: Reported on 12/30/2017) 30 tablet 3 Not Taking at Unknown time  . potassium chloride SA (K-DUR,KLOR-CON) 20 MEQ tablet Take 1 tablet (20 mEq total) by mouth 2 (  two) times daily. (Patient not taking: Reported on 12/30/2017) 60 tablet 2 Not Taking at Unknown time  . prochlorperazine (COMPAZINE) 10 MG tablet Take 1 tablet (10 mg total) by mouth every 6 (six) hours as needed (Nausea or vomiting). (Patient not taking: Reported on 12/30/2017) 30 tablet 3 Not Taking at Unknown time    Assessment: 57 yo M with new DVT.  Pharmacy consulted to dose low dose heparin with no bolus after pt experienced a nosebleed with xarelto.  LLE acute DVT 1/3, CT scan showed extensive thrombosis in the IVC, common iliac, left iliac vein and  femoral vein Due to his prior history of GI bleeding from his left colon mass when he was on maintenance Xarelto (did not do loading dose), he is at high risk for recurrent bleeding from anticoagulation. He refused eliquis 1/5 AM dose with nosebleed 1/5.  Last dose 1/4 at 2149. MD request NO BOLUS and low goal.  Anticipate elevated heparin level 2nd recent Eliquis dose.  Will adjust dose based on aPTTs for now.  Hg 9.4>8.4, pltc 146>116  Goal of Therapy:  No bolus Heparin level 0.3 - 0.5 - low goal per MD request - APTT goal 66 - 84 seconds for low goal per MD request Monitor platelets by anticoagulation protocol: Yes   Plan:  Start heparin drip with no bolus (per MD) at 750 units/hr and check 8 hr aPTT level Daily aPTT/heparin level and CBC while on heparin Eudelia Bunch, Pharm.D. [phone] 01/01/2018 11:53 AM

## 2018-01-01 NOTE — Progress Notes (Signed)
Jeff [name]   [name]:[birth year]   WE#:[id number]   YPP#:[id number]  ONCOLOGY FOLLOW UP   Subjective: Patient is well-known to me, under my care for metastatic colon cancer.  He is on palliative chemo, last treatment on November 27, 2017.  He was admitted from emergency room yesterday for worsening left lower extremity edema and pain, secondary to DVT.  He started Eliquis to 5 mg twice daily yesterday, developed mild nosebleeding this morning, and refused to take more Eliquis.  But he did notice slightly improvement of the swelling and pain of left leg since he started Eliquis.   Objective:  Vitals:   01/01/18 0150 01/01/18 0531  BP:  103/61  Pulse:  90  Resp:  16  Temp: 98.8 F (37.1 C) 98.2 F (36.8 C)  SpO2:  100%    Body mass index is 19.35 kg/m.  Intake/Output Summary (Last 24 hours) at 01/01/2018 1050 Last data filed at 01/01/2018 0531 Gross per 24 hour  Intake 600 ml  Output 275 ml  Net 325 ml     Sclerae unicteric  Oropharynx clear  No peripheral adenopathy  Lungs clear -- no rales or rhonchi  Heart regular rate and rhythm  Abdomen benign  MSK no focal spinal tenderness, no peripheral edema  Neuro nonfocal   CBG (last 3)  No results for input(s): GLUCAP in the last 72 hours.   Labs:  Lab Results  Component Value Date   WBC 4.9 01/01/2018   HGB 8.5 (L) 01/01/2018   HCT 26.5 (L) 01/01/2018   MCV 83.3 01/01/2018   PLT 116 (L) 01/01/2018   NEUTROABS 4.6 12/30/2017   CMP Latest Ref Rng & Units 01/01/2018 12/30/2017 12/27/2017  Glucose 65 - 99 mg/dL 106(H) 109(H) 135  BUN 6 - 20 mg/dL 10 9 11.3  Creatinine 0.61 - 1.24 mg/dL 0.78 0.80 0.9  Sodium 135 - 145 mmol/L 135 134(L) 134(L)  Potassium 3.5 - 5.1 mmol/L 4.0 3.4(L) 3.2(L)  Chloride 101 - 111 mmol/L 101 100(L) -  CO2 22 - 32 mmol/L 28 26 23   Calcium 8.9 - 10.3 mg/dL 8.2(L) 8.2(L) 8.3(L)  Total Protein 6.5 - 8.1 g/dL - 8.4(H) 7.8  Total Bilirubin 0.3 - 1.2 mg/dL - 0.7 0.52  Alkaline Phos 38 - 126 U/L - 66 69   AST 15 - 41 U/L - 22 16  ALT 17 - 63 U/L - 8(L) 6     Urine Studies No results for input(s): UHGB, CRYS in the last 72 hours.  Invalid input(s): UACOL, UAPR, USPG, UPH, UTP, UGL, UKET, UBIL, UNIT, UROB, ULEU, UEPI, UWBC, URBC, UBAC, CAST, UCOM, [place]  Basic Metabolic Panel: Recent Labs  Lab 12/27/17 0820 12/30/17 1907 01/01/18 0528  NA 134* 134* 135  K 3.2* 3.4* 4.0  CL  --  100* 101  CO2 23 26 28   GLUCOSE 135 109* 106*  BUN 11.3 9 10   CREATININE 0.9 0.80 0.78  CALCIUM 8.3* 8.2* 8.2*  MG 1.0*  --  1.1*   GFR Estimated Creatinine Clearance: 84.6 mL/min (by C-G formula based on SCr of 0.78 mg/dL). Liver Function Tests: Recent Labs  Lab 12/27/17 0820 12/30/17 1907  AST 16 22  ALT 6 8*  ALKPHOS 69 66  BILITOT 0.52 0.7  PROT 7.8 8.4*  ALBUMIN 3.0* 3.2*   No results for input(s): LIPASE, AMYLASE in the last 168 hours. No results for input(s): AMMONIA in the last 168 hours. Coagulation profile No results for input(s): INR, PROTIME in the last 168  hours.  CBC: Recent Labs  Lab 12/27/17 0820 12/30/17 1907 01/01/18 0528  WBC 6.2 6.4 4.9  NEUTROABS 4.1 4.6  --   HGB 9.4* 9.4* 8.5*  HCT 29.0* 29.3* 26.5*  MCV 84.3 83.0 83.3  PLT 126* 156 116*   Cardiac Enzymes: No results for input(s): CKTOTAL, CKMB, CKMBINDEX, TROPONINI in the last 168 hours. BNP: Invalid input(s): POCBNP CBG: No results for input(s): GLUCAP in the last 168 hours. D-Dimer No results for input(s): DDIMER in the last 72 hours. Hgb A1c No results for input(s): HGBA1C in the last 72 hours. Lipid Profile No results for input(s): CHOL, HDL, LDLCALC, TRIG, CHOLHDL, LDLDIRECT in the last 72 hours. Thyroid function studies No results for input(s): TSH, T4TOTAL, T3FREE, THYROIDAB in the last 72 hours.  Invalid input(s): FREET3 Anemia work up No results for input(s): VITAMINB12, FOLATE, FERRITIN, TIBC, IRON, RETICCTPCT in the last 72 hours. Microbiology Recent Results (from the past 240  hour(s))  Urine culture     Status: None   Collection Time: 12/30/17  8:50 PM  Result Value Ref Range Status   Specimen Description URINE, CLEAN CATCH  Final   Special Requests NONE  Final   Culture   Final    NO GROWTH Performed at [facility], [address] [place], [place] [address]    Report Status 01/01/2018 FINAL  Final      Studies:  Ct Abdomen Pelvis W Contrast  Result Date: 12/30/2017 CLINICAL DATA:  57 year old male with abdominal, left pelvic and left leg pain and swelling for several days. History of stage IV colon cancer and currently on chemotherapy. EXAM: CT ABDOMEN AND PELVIS WITH CONTRAST TECHNIQUE: Multidetector CT imaging of the abdomen and pelvis was performed using the standard protocol following bolus administration of intravenous contrast. CONTRAST:  15mL ISOVUE-300 IOPAMIDOL (ISOVUE-300) INJECTION 61% COMPARISON:  11/25/2017 CT and prior studies FINDINGS: Lower chest: No acute abnormality. Hepatobiliary: No significant hepatic or gallbladder abnormalities. No biliary dilatation. Pancreas: Unremarkable Spleen: A 4.2 x 5.9 cm complex cystic mass in the splenic hilum is unchanged. No new abnormalities identified. Adrenals/Urinary Tract: The kidneys, adrenal glands and bladder are unremarkable except for mild circumferential bladder wall thickening. 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 Wall thickening of the colon at the splenic flexure is unchanged. No bowel obstruction, other definite bowel wall thickening or inflammatory changes. Vascular/Lymphatic: An infrarenal IVC filter is noted with thrombus in the IVC, common iliac veins, left iliac and femoral veins. There is enlargement and inflammation along the left iliac and common femoral vein since the prior study. Aortic atherosclerotic calcifications noted without aneurysm. No definite enlarged lymph nodes. Reproductive: No definite prostate abnormality Other: Stranding in the subcutaneous and  mesenteric fat noted. A trace amount of free fluid within the pelvis is noted. No pneumoperitoneum or definite abscess. Musculoskeletal: No acute abnormality or suspicious bony lesion. IMPRESSION: 1. 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 However, there is now increasing thrombus within the left iliac and femoral veins with adjacent inflammation. 2. Mild circumferential bladder wall thickening which may be reactive or could indicate cystitis. Correlate clinically. 3. Unchanged complex cystic mass in the splenic hilum and focal wall thickening of the splenic flexure. 4.  Aortic Atherosclerosis (ICD10-I70.0). Electronically Signed   By: Margarette Canada M.D.   On: 12/30/2017 20:36    Assessment: 57 y.o. with metastatic descending colon cancer to peritoneum, on palliative chemotherapy, history of right lower extremity DVT on February 2018, had IVC filter  put in in August 2018 due to severe GI bleeding when he was on Xarelto and was admitted yesterday for worsening, left lower extremity DVT.  1. Left low extremity DVT, acute, CT scan showed extensive thrombosis in the IVC, common iliac, left iliac vein and femoral vein 2.  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 Left leg edema and pain, difficulty walking due to #1 4. Anemia of neoplasm, and anemia secondary to chemotherapy 5.  Moderate protein and calorie malnutrition  Plan:  -Due to his prior history of GI bleeding from his left colon mass when he was on maintenance Xarelto, he is at high risk for recurrent bleeding from anticoagulation. -However he is very somatic from his extensive thrombosis from IVC to left femur vein, which she has significantly impact his quality of life, mobility, and ability to continue chemo, I do recommend restart anticoagulation.  I had a long conversation in the ED yesterday with him, and he was agreeable to start moderate to low-dose  anticoagulation in the hospital when he is being watched. -I spoke with IR Dr. Laurence Ferrari today regarding intravenous thrombolysis or thrombectomy. He will see pt over the weekend. My understanding from Dr. Laurence Ferrari is that he would offer thrombectomy with a device, and he prefer pt to stay on full dose anticoagulation fr 4-6 weeks before we switch to prophylactic dose anticoagulation -After lengthy discussion with patient, and also discussed with hospitalist Dr. Denton Brick, patient is agreeable to switch from Eliquis to intravenous heparin drip, will skip heparin bolus, and keep heparin level at the low end of the goal. Will watch his bleeding signs closely  -If his left leg edema pain dose not improve in the next 1-2 days when he is on heparin, Dr. Laurence Ferrari consider thrombectomy -I will f/u closely    [name], MD 01/01/2018  10:50 AM
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
## 2018-01-02 DIAGNOSIS — Z7189 Other specified counseling: Secondary | ICD-10-CM

## 2018-01-02 DIAGNOSIS — Z515 Encounter for palliative care: Secondary | ICD-10-CM

## 2018-01-02 DIAGNOSIS — C189 Malignant neoplasm of colon, unspecified: Secondary | ICD-10-CM

## 2018-01-02 LAB — CBC
HCT: 26.3 % — ABNORMAL LOW (ref 39.0–52.0)
Hemoglobin: 8.4 g/dL — ABNORMAL LOW (ref 13.0–17.0)
MCH: 26.8 pg (ref 26.0–34.0)
MCHC: 31.9 g/dL (ref 30.0–36.0)
MCV: 83.8 fL (ref 78.0–100.0)
Platelets: 139 10*3/uL — ABNORMAL LOW (ref 150–400)
RBC: 3.14 MIL/uL — ABNORMAL LOW (ref 4.22–5.81)
RDW: 17.7 % — AB (ref 11.5–15.5)
WBC: 4.8 10*3/uL (ref 4.0–10.5)

## 2018-01-02 LAB — BASIC METABOLIC PANEL
Anion gap: 7 (ref 5–15)
BUN: 8 mg/dL (ref 6–20)
CALCIUM: 8.1 mg/dL — AB (ref 8.9–10.3)
CO2: 26 mmol/L (ref 22–32)
Chloride: 99 mmol/L — ABNORMAL LOW (ref 101–111)
Creatinine, Ser: 0.69 mg/dL (ref 0.61–1.24)
GFR calc Af Amer: >60 mL/min (ref >60)
GLUCOSE: 101 mg/dL — AB (ref 65–99)
Potassium: 3.5 mmol/L (ref 3.5–5.1)
Sodium: 132 mmol/L — ABNORMAL LOW (ref 135–145)

## 2018-01-02 LAB — APTT
APTT: 48 s — AB (ref 24–36)
aPTT: 44 seconds — ABNORMAL HIGH (ref 24–36)

## 2018-01-02 LAB — HEPARIN LEVEL (UNFRACTIONATED): HEPARIN UNFRACTIONATED: 0.51 [IU]/mL (ref 0.30–0.70)

## 2018-01-02 MED ORDER — MAGNESIUM SULFATE 2 GM/50ML IV SOLN
2.0000 g | Freq: Once | INTRAVENOUS | Status: AC
  Administered 2018-01-02: 2 g via INTRAVENOUS
  Filled 2018-01-02: qty 50

## 2018-01-02 MED ORDER — HEPARIN (PORCINE) IN NACL 100-0.45 UNIT/ML-% IJ SOLN
1250.0000 [IU]/h | INTRAMUSCULAR | Status: DC
  Administered 2018-01-03: 1250 [IU]/h via INTRAVENOUS
  Filled 2018-01-02 (×4): qty 250

## 2018-01-02 MED ORDER — HEPARIN (PORCINE) IN NACL 100-0.45 UNIT/ML-% IJ SOLN
1000.0000 [IU]/h | INTRAMUSCULAR | Status: DC
  Administered 2018-01-02: 19:00:00 1000 [IU]/h via INTRAVENOUS
  Filled 2018-01-02 (×2): qty 250

## 2018-01-02 MED ORDER — ADULT MULTIVITAMIN W/MINERALS CH
1.0000 | ORAL_TABLET | Freq: Every day | ORAL | Status: DC
  Administered 2018-01-03 – 2018-01-11 (×7): 1 via ORAL
  Filled 2018-01-02 (×7): qty 1

## 2018-01-02 NOTE — Progress Notes (Signed)
PT Cancellation Note  Patient Details Name: DANYEL GRIESS MRN: [id number] [name]: 09-Apr-1961   Cancelled Treatment:    Reason Eval/Treat Not Completed: Other (comment)has ambulatedwith RN and will do so again. Check back in AM   Claretha Cooper 01/02/2018, 4:40 PM Tresa Endo PT [phone]

## 2018-01-02 NOTE — Progress Notes (Signed)
ANTICOAGULATION CONSULT NOTE  Pharmacy Consult for heparin Indication: DVT  No Known Allergies  Patient Measurements: Height: 5\' 9"  (175.3 cm) Weight: 131 lb (59.4 kg) IBW/kg (Calculated) : 70.7 Heparin Dosing Weight: 59.4 kg  Vital Signs: Temp: 98.7 F (37.1 C) (01/06 1425) Temp Source: Oral (01/06 1425) BP: 107/64 (01/06 1425) Pulse Rate: 91 (01/06 1425)  Labs: Recent Labs    12/30/17 1907 01/01/18 0528 01/01/18 2003 01/02/18 0537  HGB 9.4* 8.5*  --  8.4*  HCT 29.3* 26.5*  --  26.3*  PLT 156 116*  --  139*  APTT  --   --  43* 48*  HEPARINUNFRC  --   --   --  0.51  CREATININE 0.80 0.78  --  0.69    Estimated Creatinine Clearance: 84.6 mL/min (by C-G formula based on SCr of 0.69 mg/dL).   Medical History: Past Medical History:  Diagnosis Date  . Acute deep vein thrombosis (DVT) of right lower extremity ([place]) 02/18/2017  . Malignant neoplasm of abdomen ([place])    [name] 01/11/2017  . Microcytic anemia    /notes 01/11/2017    Medications:  Medications Prior to Admission  Medication Sig Dispense Refill Last Dose  . diphenoxylate-atropine (LOMOTIL) 2.5-0.025 MG tablet Take 1-2 tablets by mouth 4 (four) times daily as needed for diarrhea or loose stools. 90 tablet 2 12/30/2017 at Unknown time  . docusate sodium (COLACE) 100 MG capsule Take 100 mg by mouth 2 (two) times daily.   12/29/2017 at Unknown time  . gabapentin (NEURONTIN) 100 MG capsule Take 1 capsule (100 mg total) 3 (three) times daily by mouth. 90 capsule 0 12/30/2017 at Unknown time  . Nutritional Supplements (NUTRITIONAL SUPPLEMENT PO) House Supplement - give 120 ml by mouth three times daily between meals for supplement   12/30/2017 at Unknown time  . Oxycodone HCl 10 MG TABS Take 1 tablet (10 mg total) by mouth every 6 (six) hours as needed. 90 tablet 0 12/30/2017 at Unknown time  . Clindamycin Phos-Benzoyl Perox gel Apply 1 application topically 2 (two) times daily. (Patient not taking: Reported on 12/30/2017) 50 g 2  Not Taking at Unknown time  . ferrous sulfate 325 (65 FE) MG tablet Take 1 tablet (325 mg total) by mouth 2 (two) times daily with a meal. (Patient not taking: Reported on 12/27/2017) 60 tablet 0 Not Taking at Unknown time  . hydrocortisone 2.5 % cream Apply topically 2 (two) times daily. (Patient not taking: Reported on 12/30/2017) 30 g 3 Not Taking at Unknown time  . loperamide (IMODIUM A-D) 2 MG tablet Take 1 tablet (2 mg total) by mouth 4 (four) times daily as needed for diarrhea or loose stools. (Patient not taking: Reported on 12/30/2017) 30 tablet 2 Not Taking at Unknown time  . magnesium oxide (MAG-OX) 400 (241.3 Mg) MG tablet Take 1 tablet (400 mg total) by mouth 2 (two) times daily. (Patient not taking: Reported on 12/30/2017) 60 tablet 1 Not Taking at Unknown time  . magnesium oxide (MAG-OX) 400 (241.3 Mg) MG tablet Take 1 tablet (400 mg total) by mouth daily. (Patient not taking: Reported on 12/30/2017) 60 tablet 0 Not Taking at Unknown time  . mirtazapine (REMERON) 15 MG tablet Take 1 tablet (15 mg total) by mouth at bedtime. (Patient not taking: Reported on 12/27/2017) 30 tablet 2 Not Taking at Unknown time  . ondansetron (ZOFRAN) 8 MG tablet Take 1 tablet (8 mg total) by mouth 2 (two) times daily as needed for refractory nausea /  vomiting. Start on day 3 after chemotherapy. (Patient not taking: Reported on 12/30/2017) 30 tablet 3 Not Taking at Unknown time  . potassium chloride SA (K-DUR,KLOR-CON) 20 MEQ tablet Take 1 tablet (20 mEq total) by mouth 2 (two) times daily. (Patient not taking: Reported on 12/30/2017) 60 tablet 2 Not Taking at Unknown time  . prochlorperazine (COMPAZINE) 10 MG tablet Take 1 tablet (10 mg total) by mouth every 6 (six) hours as needed (Nausea or vomiting). (Patient not taking: Reported on 12/30/2017) 30 tablet 3 Not Taking at Unknown time    Assessment: 57 yo M with new DVT.  Pharmacy consulted to dose low dose heparin with no bolus after pt experienced a nosebleed with  xarelto.  LLE acute DVT 1/3, CT scan showed extensive thrombosis in the IVC, common iliac, left iliac vein and femoral vein  Due to his prior history of GI bleeding from his left colon mass when he was on maintenance Xarelto (did not do loading dose), he is at high risk for recurrent bleeding from anticoagulation.   He refused eliquis 1/5 AM dose with nosebleed 1/5.   Last dose 1/4 at 2149. MD request NO BOLUS and low goal.   Today, 01/02/2018:  CBC: hgb low but stable since admission; Plt also low but improved since yesterday  Most recent aPTT remains subtherapeutic on 10 units/hr, and lower despite previous rate increase. Heparin level of 0.51 would confirm subtherapeutic levels as this should be falsely elevated from recent Eliquis  No bleeding issues per RN; patient does remember his antecubital site being rather sensitive earlier today and causing the pump to alarm, but not much in the past 4 hr. RN will attempt to relocate the IV site.  CrCl 85 ml/min   Goal of Therapy:  Heparin level 0.3 - 0.5 - low goal per MD request - APTT goal 66 - 84 seconds for low goal per MD request Monitor platelets by anticoagulation protocol: Yes   Plan:   Increase heparin IV infusion to 1250 units/hr  Check aPTT 6 hrs after rate change and as clinically needed  Daily CBC, daily heparin level once stable  Monitor for signs of bleeding or thrombosis   Reuel Boom, PharmD Pager: [phone] 01/02/2018, 6:53 PM

## 2018-01-02 NOTE — Progress Notes (Signed)
Patient Demographics:    Jeff [name], is a 57 y.o. male, [name] - 05/02/1961, VQM:[id number]  Admit date - 12/30/2017   Admitting Physician Theodis Blaze, MD  Outpatient Primary MD for the patient is [name], MD  LOS - 1  Chief Complaint  Patient presents with  . Leg Swelling        Subjective:    Jeff [name] today has no fevers, no emesis,  No chest pain,  Poor appetite, Lt leg pain is better, no diarrhea   Assessment  & Plan :    Active Problems:   DVT (deep venous thrombosis) (HCC)  Brief Summary:- 57 y.o. 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   Plan:- 1) Left low extremity DVT, acute-continue IV heparin drip per Dr Burr Medico, at this time patient is not sure he wants interventional radiology to do thrombectomy procedure, CT scan showed extensive thrombosis in the IVC, common iliac, left iliac vein and femoral vein-  prior history of GI bleeding from his left colon mass when he was on maintenance Xarelto, he is at high risk for recurrent bleeding from anticoagulation, but patient left lower extremity DVT is very symptomatic and painful, patient had nosebleeds on 01/01/2018 after starting Eliquis on 12/31/2017. If pt is agreeable Dr. Laurence Ferrari from IR will consider thrombectomy with a device, and he prefer pt to stay on full dose anticoagulation for 4-6 weeks before we switch to prophylactic dose anticoagulation.  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, on palliative chemotherapy 5-FU and Panitumumab-last chemo session 11/27/2017, follows with Dr Burr Medico  3)Acute  on chronic chronic anemia of chronic disease-hemoglobin is down to 8.4, in the setting of underlying colon cancer and chemotherapy anemia of neoplasm, and anemia secondary to chemotherapy, high risk for bleeding and further blood loss and on anticoagulation watch closely and transfuse as clinically indicated  4)Moderate Protein and calorie malnutrition with Anorexia-supplements advised dietitian consult requested, c/n Remeron for appetite stimulation   5)Social/Ethics-patient is a full code, palliative care consult requested  6)FEN-  Hypokalemia/Hypomagnesemia-continue to replace and recheck   Disposition Plan  : pt lived alone PTA  Consults  :  Palliative/IR/Oncology  DVT Prophylaxis  :  iv Heparin   Lab Results  Component Value Date   PLT 139 (L) 01/02/2018    Inpatient Medications  Scheduled Meds: . docusate sodium  100 mg Oral BID  . feeding supplement (ENSURE ENLIVE)  120 mL Oral TID  . gabapentin  100 mg Oral TID  . mirtazapine  15 mg Oral QHS  . multivitamin with minerals  1 tablet Oral Daily   Continuous Infusions: . heparin 1,000 Units/hr (01/02/18 0833)   PRN Meds:.acetaminophen, HYDROmorphone (DILAUDID) injection, ondansetron, oxyCODONE, sodium chloride flush   Anti-infectives (From admission, onward)   None        Objective:   Vitals:   01/01/18 1324 01/01/18 2224 01/02/18 0541 01/02/18 1425  BP: 98/67 95/63 102/62 107/64  Pulse: 95  98 (!) 103 91  Resp: 18 18 18 18   Temp: 99.7 F (37.6 C) 99.1 F (37.3 C) 99.2 F (37.3 C) 98.7 F (37.1 C)  TempSrc: Oral Oral Oral Oral  SpO2: 100% 100% 100% 100%  Weight:      Height:        Wt Readings from Last 3 Encounters:  12/31/17 59.4 kg (131 lb)  12/27/17 59.7 kg (131 lb 11.2 oz)  12/13/17 57.5 kg (126 lb 12.8 oz)     Intake/Output Summary (Last 24 hours) at 01/02/2018 1717 Last data filed at 01/02/2018 1600 Gross per 24 hour  Intake 617.97 ml  Output 650 ml  Net -32.03 ml   Physical Exam  Gen:-  Awake Alert,  In no apparent distress  HEENT:- [place].AT, No sclera icterus Neck-Supple Neck,No JVD,.  Lungs-  CTAB  CV- S1, S2 normal Abd-  +ve B.Sounds, Abd Soft, No tenderness, colostomy Extremity/Skin:- Lt LE warmth, swelling, tenderness, extending from the upper thigh all the way into the foot Psych-affect is flat    Data Review:   Micro Results Recent Results (from the past 240 hour(s))  Urine culture     Status: None   Collection Time: 12/30/17  8:50 PM  Result Value Ref Range Status   Specimen Description URINE, CLEAN CATCH  Final   Special Requests NONE  Final   Culture   Final    NO GROWTH Performed at [facility], [place] [address] [place], [place] [address]    Report Status 01/01/2018 FINAL  Final    Radiology Reports Ct Abdomen Pelvis W Contrast  Result Date: 12/30/2017 CLINICAL DATA:  57 year old male with abdominal, left pelvic and left leg pain and swelling for several days. History of stage IV colon cancer and currently on chemotherapy. EXAM: CT ABDOMEN AND PELVIS WITH CONTRAST TECHNIQUE: Multidetector CT imaging of the abdomen and pelvis was performed using the standard protocol following bolus administration of intravenous contrast. CONTRAST:  126mL ISOVUE-300 IOPAMIDOL (ISOVUE-300) INJECTION 61% COMPARISON:  11/25/2017 CT and prior studies FINDINGS: Lower chest: No acute abnormality. Hepatobiliary: No significant hepatic or gallbladder abnormalities. No biliary dilatation. Pancreas: Unremarkable Spleen: A 4.2 x 5.9 cm complex cystic mass in the splenic hilum is unchanged. No new abnormalities identified. Adrenals/Urinary Tract: The kidneys, adrenal glands and bladder are unremarkable except for mild circumferential bladder wall thickening. 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 Wall thickening of the colon at the splenic flexure is unchanged. No bowel obstruction, other definite bowel wall thickening or inflammatory changes. Vascular/Lymphatic: An  infrarenal IVC filter is noted with thrombus in the IVC, common iliac veins, left iliac and femoral veins. There is enlargement and inflammation along the left iliac and common femoral vein since the prior study. Aortic atherosclerotic calcifications noted without aneurysm. No definite enlarged lymph nodes. Reproductive: No definite prostate abnormality Other: Stranding in the subcutaneous and mesenteric fat noted. A trace amount of free fluid within the pelvis is noted. No pneumoperitoneum or definite abscess. Musculoskeletal: No acute abnormality or suspicious bony lesion. IMPRESSION: 1. 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 However, there is now increasing thrombus within the left iliac and femoral veins with adjacent inflammation. 2. Mild circumferential bladder wall thickening which may be reactive or could indicate cystitis. Correlate clinically. 3. Unchanged complex cystic mass in the splenic hilum and focal wall thickening of the splenic flexure. 4.  Aortic Atherosclerosis (ICD10-I70.0). Electronically Signed   By: Margarette Canada M.D.   On: 12/30/2017 20:36  CBC Recent Labs  Lab 12/27/17 0820 12/30/17 1907 01/01/18 0528 01/02/18 0537  WBC 6.2 6.4 4.9 4.8  HGB 9.4* 9.4* 8.5* 8.4*  HCT 29.0* 29.3* 26.5* 26.3*  PLT 126* 156 116* 139*  MCV 84.3 83.0 83.3 83.8  MCH 27.3 26.6 26.7 26.8  MCHC 32.4 32.1 32.1 31.9  RDW 18.1* 17.7* 17.5* 17.7*  LYMPHSABS 1.2 1.3  --   --   MONOABS 0.8 0.4  --   --   EOSABS 0.1 0.1  --   --   BASOSABS 0.1 0.1  --   --     Chemistries  Recent Labs  Lab 12/27/17 0820 12/30/17 1907 01/01/18 0528 01/02/18 0537  NA 134* 134* 135 132*  K 3.2* 3.4* 4.0 3.5  CL  --  100* 101 99*  CO2 23 26 28 26   GLUCOSE 135 109* 106* 101*  BUN 11.3 9 10 8   CREATININE 0.9 0.80 0.78 0.69  CALCIUM 8.3* 8.2* 8.2* 8.1*  MG 1.0*  --  1.1* 0.9*  AST 16 22  --   --   ALT 6 8*  --   --   ALKPHOS 69 66  --   --   BILITOT 0.52 0.7  --   --     ------------------------------------------------------------------------------------------------------------------ No results for input(s): CHOL, HDL, LDLCALC, TRIG, CHOLHDL, LDLDIRECT in the last 72 hours.  No results found for: HGBA1C ------------------------------------------------------------------------------------------------------------------ No results for input(s): TSH, T4TOTAL, T3FREE, THYROIDAB in the last 72 hours.  Invalid input(s): FREET3 ------------------------------------------------------------------------------------------------------------------ No results for input(s): VITAMINB12, FOLATE, FERRITIN, TIBC, IRON, RETICCTPCT in the last 72 hours.  Coagulation profile No results for input(s): INR, PROTIME in the last 168 hours.  No results for input(s): DDIMER in the last 72 hours.  Cardiac Enzymes No results for input(s): CKMB, TROPONINI, MYOGLOBIN in the last 168 hours.  Invalid input(s): CK ------------------------------------------------------------------------------------------------------------------ No results found for: BNP   Roxan Hockey M.D on 01/02/2018 at 5:17 PM  Between 7am to 7pm - Pager - [phone]  After 7pm go to [email or web address] - password [email or web address]  [facility] -  Office  [phone]   Voice Recognition Viviann Spare dictation system was used to create this note, attempts have been made to correct errors. Please contact the author with questions and/or clarifications.

## 2018-01-02 NOTE — Progress Notes (Signed)
Jeff [name]   [name]:November 11, 1961   IW#:[id number]   ERD#:[id number]  ONCOLOGY FOLLOW UP   Subjective: Patient has bene on heparin drip, tolerating well, no any signs of bleeding. His left leg edema has slightly improved. He states the leg pain is tolerable.  No other new complaints.  He has not tried to walk since he was admitted.  Objective:  Vitals:   01/01/18 2224 01/02/18 0541  BP: 95/63 102/62  Pulse: 98 (!) 103  Resp: 18 18  Temp: 99.1 F (37.3 C) 99.2 F (37.3 C)  SpO2: 100% 100%    Body mass index is 19.35 kg/m.  Intake/Output Summary (Last 24 hours) at 01/02/2018 1414 Last data filed at 01/02/2018 1330 Gross per 24 hour  Intake 812.6 ml  Output 350 ml  Net 462.6 ml     Sclerae unicteric  Oropharynx clear  No peripheral adenopathy  Lungs clear -- no rales or rhonchi  Heart regular rate and rhythm  Abdomen benign  MSK no focal spinal tenderness, no peripheral edema  Neuro nonfocal   CBG (last 3)  No results for input(s): GLUCAP in the last 72 hours.   Labs:  Lab Results  Component Value Date   WBC 4.8 01/02/2018   HGB 8.4 (L) 01/02/2018   HCT 26.3 (L) 01/02/2018   MCV 83.8 01/02/2018   PLT 139 (L) 01/02/2018   NEUTROABS 4.6 12/30/2017   CMP Latest Ref Rng & Units 01/02/2018 01/01/2018 12/30/2017  Glucose 65 - 99 mg/dL 101(H) 106(H) 109(H)  BUN 6 - 20 mg/dL 8 10 9   Creatinine 0.61 - 1.24 mg/dL 0.69 0.78 0.80  Sodium 135 - 145 mmol/L 132(L) 135 134(L)  Potassium 3.5 - 5.1 mmol/L 3.5 4.0 3.4(L)  Chloride 101 - 111 mmol/L 99(L) 101 100(L)  CO2 22 - 32 mmol/L 26 28 26   Calcium 8.9 - 10.3 mg/dL 8.1(L) 8.2(L) 8.2(L)  Total Protein 6.5 - 8.1 g/dL - - 8.4(H)  Total Bilirubin 0.3 - 1.2 mg/dL - - 0.7  Alkaline Phos 38 - 126 U/L - - 66  AST 15 - 41 U/L - - 22  ALT 17 - 63 U/L - - 8(L)     Urine Studies No results for input(s): UHGB, CRYS in the last 72 hours.  Invalid input(s): UACOL, UAPR, USPG, UPH, UTP, UGL, UKET, UBIL, UNIT, UROB, ULEU, UEPI, UWBC, URBC, UBAC,  CAST, [place], [place]  Basic Metabolic Panel: Recent Labs  Lab 12/27/17 0820  12/30/17 1907 01/01/18 0528 01/02/18 0537  NA 134*  --  134* 135 132*  K 3.2*   < > 3.4* 4.0 3.5  CL  --   --  100* 101 99*  CO2 23  --  26 28 26   GLUCOSE 135  --  109* 106* 101*  BUN 11.3  --  9 10 8   CREATININE 0.9  --  0.80 0.78 0.69  CALCIUM 8.3*  --  8.2* 8.2* 8.1*  MG 1.0*  --   --  1.1* 0.9*   < > = values in this interval not displayed.   GFR Estimated Creatinine Clearance: 84.6 mL/min (by C-G formula based on SCr of 0.69 mg/dL). Liver Function Tests: Recent Labs  Lab 12/27/17 0820 12/30/17 1907  AST 16 22  ALT 6 8*  ALKPHOS 69 66  BILITOT 0.52 0.7  PROT 7.8 8.4*  ALBUMIN 3.0* 3.2*   No results for input(s): LIPASE, AMYLASE in the last 168 hours. No results for input(s): AMMONIA in the last 168 hours. Coagulation  profile No results for input(s): INR, PROTIME in the last 168 hours.  CBC: Recent Labs  Lab 12/27/17 0820 12/30/17 1907 01/01/18 0528 01/02/18 0537  WBC 6.2 6.4 4.9 4.8  NEUTROABS 4.1 4.6  --   --   HGB 9.4* 9.4* 8.5* 8.4*  HCT 29.0* 29.3* 26.5* 26.3*  MCV 84.3 83.0 83.3 83.8  PLT 126* 156 116* 139*   Cardiac Enzymes: No results for input(s): CKTOTAL, CKMB, CKMBINDEX, TROPONINI in the last 168 hours. BNP: Invalid input(s): POCBNP CBG: No results for input(s): GLUCAP in the last 168 hours. D-Dimer No results for input(s): DDIMER in the last 72 hours. Hgb A1c No results for input(s): HGBA1C in the last 72 hours. Lipid Profile No results for input(s): CHOL, HDL, LDLCALC, TRIG, CHOLHDL, LDLDIRECT in the last 72 hours. Thyroid function studies No results for input(s): TSH, T4TOTAL, T3FREE, THYROIDAB in the last 72 hours.  Invalid input(s): FREET3 Anemia work up No results for input(s): VITAMINB12, FOLATE, FERRITIN, TIBC, IRON, RETICCTPCT in the last 72 hours. Microbiology Recent Results (from the past 240 hour(s))  Urine culture     Status: None   Collection  Time: 12/30/17  8:50 PM  Result Value Ref Range Status   Specimen Description URINE, CLEAN CATCH  Final   Special Requests NONE  Final   Culture   Final    NO GROWTH Performed at [facility], [address] [place], [place] [address]    Report Status 01/01/2018 FINAL  Final      Studies:  No results found.  Assessment: 57 y.o. 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  1. extensive Left low extremity DVT, acute, CT scan showed extensive thrombosis in the IVC, common iliac, left iliac vein and femoral vein 2.  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 Left leg edema and pain, difficulty walking due to #1 4. Anemia of neoplasm, and anemia secondary to chemotherapy 5.  Moderate protein and calorie malnutrition  Plan:  -Due to his prior history of GI bleeding from his left colon mass when he was on maintenance Xarelto, he is at high risk for recurrent bleeding from anticoagulation. -appreciate Dr. Laurence Ferrari and [facility] input and f/u, angiovac procedure was offered and possible can be arranged for mid-next week. He needs a brain MRI before the procedure to rule out brain mets. However patient has not decided if he wants to proceed or not, he has not even called his daughter yet.  He states his daughter will be here tomorrow -I called his daughter [name] but no answered and left a message regarding the procedure, and ask her to call me back  -he is tolerating heparin drip well, no signs of bleeding, will continue -I strongly encouraged him to get out of bed and try to ambulate, he does not seem to be motivated to do things  -I will f/u    [name], MD 01/02/2018   2:14 PM

## 2018-01-02 NOTE — Progress Notes (Signed)
Initial Nutrition Assessment  DOCUMENTATION CODES:   Severe malnutrition in context of chronic illness  INTERVENTION:   -Continue Ensure Enlive po TID, each supplement provides 350 kcal and 20 grams of protein -Provide Multivitamin with minerals daily -RD will continue to monitor  NUTRITION DIAGNOSIS:   Severe Malnutrition related to cancer and cancer related treatments, chronic illness, catabolic illness as evidenced by severe fat depletion, severe muscle depletion.  GOAL:   Patient will meet greater than or equal to 90% of their needs  MONITOR:   PO intake, Supplement acceptance, Weight trends, Labs, I & O's  REASON FOR ASSESSMENT:   Consult Assessment of nutrition requirement/status  ASSESSMENT:   57 y.o. 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  Pt followed by [facility], last seen on 12/17 in which pt reported no nutrition impact symptoms. Pt was also provided a complimentary case of Ensure supplements on 12/31.   Pt reports poor appetite but is eating a range of 50-100% of meals. This morning he did consume 100% of breakfast which consisted of eggs, bacon, grits, fruit and OJ (~500 kcal and 18g protein).  Pt is inconsistently drinking ensure supplements but will continue order as pt drinks this at home.   Per chart review, pt has lost 10 lb since 8/10 (7% wt loss x 5 months, insignificant for time frame). Pt has been able to maintain weight for the past 3 months.   Labs reviewed: Low Na, Mg Medications: Colace capsule BID, Remeron tablet daily, IV Mg sulfate once  NUTRITION - FOCUSED PHYSICAL EXAM:    Most Recent Value  Orbital Region  Unable to assess  Upper Arm Region  Severe depletion  Thoracic and Lumbar  Region  Unable to assess  Buccal Region  Moderate depletion  Temple Region  Severe depletion  Clavicle Bone Region  Severe depletion  Clavicle and Acromion Bone Region  Severe depletion  Scapular Bone Region  Unable to assess  Dorsal Hand  Moderate depletion  Patellar Region  Unable to assess  Anterior Thigh Region  Unable to assess  Posterior Calf Region  Unable to assess  Edema (RD Assessment)  Moderate       Diet Order:  Diet regular Room service appropriate? Yes; Fluid consistency: Thin  EDUCATION NEEDS:   No education needs have been identified at this time  Skin:  Skin Assessment: Reviewed RN Assessment  Last BM:  1/4  Height:   Ht Readings from Last 1 Encounters:  12/31/17 5\' 9"  (1.753 m)    Weight:   Wt Readings from Last 1 Encounters:  12/31/17 131 lb (59.4 kg)    Ideal Body Weight:  72.7 kg  BMI:  Body mass index is 19.35 kg/m.  Estimated Nutritional Needs:   Kcal:  1800-2000  Protein:  90-100g  Fluid:  2L/day  Clayton Bibles, MS, RD, LDN [place] Dietitian Pager: [phone] After Hours Pager: [phone]

## 2018-01-02 NOTE — Progress Notes (Signed)
Referring Physician(s): Dr. [name]  Supervising Physician: Jacqulynn Cadet  Patient Status: [facility] - In-pt  Chief Complaint: LLE DVT  Subjective: Patient has no new complaints today.  Allergies: Patient has no known allergies.  Medications: Prior to Admission medications   Medication Sig Start Date End Date Taking? Authorizing Provider  diphenoxylate-atropine (LOMOTIL) 2.5-0.025 MG tablet Take 1-2 tablets by mouth 4 (four) times daily as needed for diarrhea or loose stools. 12/13/17  Yes [name], MD  docusate sodium (COLACE) 100 MG capsule Take 100 mg by mouth 2 (two) times daily.   Yes [provider]  gabapentin (NEURONTIN) 100 MG capsule Take 1 capsule (100 mg total) 3 (three) times daily by mouth. 11/15/17  Yes [name], MD  Nutritional Supplements (NUTRITIONAL SUPPLEMENT PO) House Supplement - give 120 ml by mouth three times daily between meals for supplement   Yes [provider]  Oxycodone HCl 10 MG TABS Take 1 tablet (10 mg total) by mouth every 6 (six) hours as needed. 12/13/17  Yes [name], MD  Clindamycin Phos-Benzoyl Perox gel Apply 1 application topically 2 (two) times daily. Patient not taking: Reported on 12/30/2017 12/27/17   [name], MD  ferrous sulfate 325 (65 FE) MG tablet Take 1 tablet (325 mg total) by mouth 2 (two) times daily with a meal. Patient not taking: Reported on 12/27/2017 03/29/17   Maryanna Shape, NP  hydrocortisone 2.5 % cream Apply topically 2 (two) times daily. Patient not taking: Reported on 12/30/2017 10/18/17   Alla Feeling, NP  loperamide (IMODIUM A-D) 2 MG tablet Take 1 tablet (2 mg total) by mouth 4 (four) times daily as needed for diarrhea or loose stools. Patient not taking: Reported on 12/30/2017 03/02/17   [name], MD  magnesium oxide (MAG-OX) 400 (241.3 Mg) MG tablet Take 1 tablet (400 mg total) by mouth 2 (two) times daily. Patient not taking: Reported on 12/30/2017 12/13/17   [name], MD  magnesium oxide  (MAG-OX) 400 (241.3 Mg) MG tablet Take 1 tablet (400 mg total) by mouth daily. Patient not taking: Reported on 12/30/2017 12/27/17   [name], MD  mirtazapine (REMERON) 15 MG tablet Take 1 tablet (15 mg total) by mouth at bedtime. Patient not taking: Reported on 12/27/2017 06/22/17   Gardenia Phlegm, NP  ondansetron (ZOFRAN) 8 MG tablet Take 1 tablet (8 mg total) by mouth 2 (two) times daily as needed for refractory nausea / vomiting. Start on day 3 after chemotherapy. Patient not taking: Reported on 12/30/2017 05/25/17   [name], MD  potassium chloride SA (K-DUR,KLOR-CON) 20 MEQ tablet Take 1 tablet (20 mEq total) by mouth 2 (two) times daily. Patient not taking: Reported on 12/30/2017 12/27/17   [name], MD  prochlorperazine (COMPAZINE) 10 MG tablet Take 1 tablet (10 mg total) by mouth every 6 (six) hours as needed (Nausea or vomiting). Patient not taking: Reported on 12/30/2017 05/25/17   [name], MD    Vital Signs: BP 102/62 (BP Location: Right Arm)   Pulse (!) 103   Temp 99.2 F (37.3 C) (Oral)   Resp 18   Ht 5\' 9"  (1.753 m)   Wt 131 lb (59.4 kg)   SpO2 100%   BMI 19.35 kg/m   Physical Exam: Ext: LLE relatively unchanged, still edematous.  Imaging: Ct Abdomen Pelvis W Contrast  Result Date: 12/30/2017 CLINICAL DATA:  57 year old male with abdominal, left pelvic and left leg pain and swelling for several days. History of  stage IV colon cancer and currently on chemotherapy. EXAM: CT ABDOMEN AND PELVIS WITH CONTRAST TECHNIQUE: Multidetector CT imaging of the abdomen and pelvis was performed using the standard protocol following bolus administration of intravenous contrast. CONTRAST:  120mL ISOVUE-300 IOPAMIDOL (ISOVUE-300) INJECTION 61% COMPARISON:  11/25/2017 CT and prior studies FINDINGS: Lower chest: No acute abnormality. Hepatobiliary: No significant hepatic or gallbladder abnormalities. No biliary dilatation. Pancreas: Unremarkable Spleen: A 4.2 x 5.9 cm complex cystic mass  in the splenic hilum is unchanged. No new abnormalities identified. Adrenals/Urinary Tract: The kidneys, adrenal glands and bladder are unremarkable except for mild circumferential bladder wall thickening. 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 Wall thickening of the colon at the splenic flexure is unchanged. No bowel obstruction, other definite bowel wall thickening or inflammatory changes. Vascular/Lymphatic: An infrarenal IVC filter is noted with thrombus in the IVC, common iliac veins, left iliac and femoral veins. There is enlargement and inflammation along the left iliac and common femoral vein since the prior study. Aortic atherosclerotic calcifications noted without aneurysm. No definite enlarged lymph nodes. Reproductive: No definite prostate abnormality Other: Stranding in the subcutaneous and mesenteric fat noted. A trace amount of free fluid within the pelvis is noted. No pneumoperitoneum or definite abscess. Musculoskeletal: No acute abnormality or suspicious bony lesion. IMPRESSION: 1. 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 However, there is now increasing thrombus within the left iliac and femoral veins with adjacent inflammation. 2. Mild circumferential bladder wall thickening which may be reactive or could indicate cystitis. Correlate clinically. 3. Unchanged complex cystic mass in the splenic hilum and focal wall thickening of the splenic flexure. 4.  Aortic Atherosclerosis (ICD10-I70.0). Electronically Signed   By: Margarette Canada M.D.   On: 12/30/2017 20:36    Labs:  CBC: Recent Labs    12/27/17 0820 12/30/17 1907 01/01/18 0528 01/02/18 0537  WBC 6.2 6.4 4.9 4.8  HGB 9.4* 9.4* 8.5* 8.4*  HCT 29.0* 29.3* 26.5* 26.3*  PLT 126* 156 116* 139*    COAGS: Recent Labs    01/29/17 1256 07/11/17 0340 07/29/17 1954 08/12/17 0527 08/19/17 1047 01/01/18 2003 01/02/18 0537  INR 1.07 1.27  --  1.02 1.77  --   --   APTT 31  --  32  --   --  43* 48*     BMP: Recent Labs    08/21/17 0319  12/27/17 0820 12/30/17 1907 01/01/18 0528 01/02/18 0537  NA 135   < > 134* 134* 135 132*  K 3.6   < > 3.2* 3.4* 4.0 3.5  CL 102  --   --  100* 101 99*  CO2 25   < > 23 26 28 26   GLUCOSE 110*   < > 135 109* 106* 101*  BUN 16   < > 11.3 9 10 8   CALCIUM 9.2   < > 8.3* 8.2* 8.2* 8.1*  CREATININE 0.80   < > 0.9 0.80 0.78 0.69  GFRNONAA >60  --   --  >60 >60 >60  GFRAA >60  --   --  >60 >60 >60   < > = values in this interval not displayed.    LIVER FUNCTION TESTS: Recent Labs    11/29/17 0950 12/13/17 0835 12/27/17 0820 12/30/17 1907  BILITOT 0.34 0.31 0.52 0.7  AST 21 29 16 22   ALT 9 10 6  8*  ALKPHOS 94 95 69 66  PROT 7.9 8.2 7.8 8.4*  ALBUMIN 3.6 3.7 3.0* 3.2*    Assessment  and Plan: 1. LLE DVT  The patient has not called his daughter and essentially refuses to do so in order to discuss his options and to have her help him make a decision on how he wants to proceed.  I asked if he had thought simply about whether he wants to do the procedure offered to him or not.  He won't look at me and just curtly says "No."  He clearly does not seem to have any interest in contemplating whether he wants to proceed with treatment for his DVT.  I informed him he needs to make a decision within the next day or so as if he ends up agreeable, we will have to obtain an MRI of his brain prior to his procedure and then contact the reps for the procedure to be set up on Thursday.  We will check on him again.  Electronically Signed: Henreitta Cea 01/02/2018, 1:16 PM   I spent a total of 15 Minutes at the the patient's bedside AND on the patient's hospital floor or unit, greater than 50% of which was counseling/coordinating care for LLE DVT

## 2018-01-02 NOTE — Consult Note (Signed)
Consultation Note Date: 01/02/2018   Patient Name: Jeff [name]  [name]: Oct 30, 1961  MRN: [id number]  Age / Sex: 57 y.o., male  PCP: [name], MD Referring Physician: Roxan Hockey, MD  Reason for Consultation: Establishing goals of care  HPI/Patient Profile: 57 y.o. male  with past medical history of colon ca met to perineum currently undergoing palliative chemtx with 5/Fu and Panitumumab, bilateral LE DVTs with IVC filter  admitted on 12/30/2017 with increasing swelling and pain in left leg. Patient was admitted for treatment of DVT. He was initially started on Eliquis, but due to nosebleed the patient requested it stopped. Now in Heparin drip and in discussion with IR re: possible angiovac, however, he would need ongoing anticoagulation after procedure due to risk of reformation. Patient is concerned about risk for bleeding. Palliative medicine consulted for [place].   Clinical Assessment and Goals of Care: I met at bedside with patient and his daughter, Jeff [name].   Prior to admission patient living independently at home. He completes ADL's independently. Jeff [name] tells me he cares for himself and prepares his own meals most days, but some days when he is not feeling well, he requires assistance. These days are mainly a few days after his chemotherapy treatments, but he is able to recover from those days quite well.   Attempted to elicit values and GOC from patient. He states that doing things on his terms and making his own decisions are important to him. Prolonging his life, and living as long as possible are also very important. He says he doesn't like to "talk about things" or feel rushed into decisions. He expresses that he feels like he's being rushed into a decision about having the procedure for his DVT. We discussed alternate options of managing DVT including pain and comfort management. Jeff [name]  asked if swelling would reoccur if procedure wasn't done and no anticoagulation- discussed, that yes, that was possible. Jeff [name] and Jeff [name] are concerned about DVT limiting patient's ability to walk. Since he has been on Heparin he has been ambulating in the halls with the walker.   We discussed what would be his goal for having the procedure. Discussed usual goals for medical procedures are pain management, increasing functional status, or prolonging life while also improving quality. He is hesitant to have any surgical procedures, but he does want to increase his function. His current GOC are to continue aggressive medical therapy including chemotherapy "until my time comes".    He has HCPOA and living will in place. His daughter- Jeff [name] is his HCPOA and he would not want life prolonging measures if he were unconscious. However, when discussing code status, he continues to desire full code status even though he is aware that he has a terminal illness. I suspect he may change his mind about this should his cancer progress and chemotherapy no longer be an option.    Symptomwise he states he is not currently experiencing pain or any other complaints. He denies nausea. He is sleeping well.  Primary Decision Maker PATIENT    SUMMARY OF RECOMMENDATIONS -Continue full scope care    Code Status/Advance Care Planning:  Full code  Palliative Prophylaxis:   Frequent Pain Assessment  Prognosis:    Unable to determine  Discharge Planning: To Be Determined  Primary Diagnoses: Present on Admission: . DVT (deep venous thrombosis) ([place])   I have reviewed the medical record, interviewed the patient and family, and examined the patient. The following aspects are pertinent.  Past Medical History:  Diagnosis Date  . Acute deep vein thrombosis (DVT) of right lower extremity ([place]) 02/18/2017  . Malignant neoplasm of abdomen ([place])    [name] 01/11/2017  . Microcytic anemia    /notes 01/11/2017     Social History   Socioeconomic History  . Marital status: Single    Spouse name: None  . Number of children: None  . Years of education: None  . Highest education level: None  Social Needs  . Financial resource strain: None  . Food insecurity - worry: None  . Food insecurity - inability: None  . Transportation needs - medical: None  . Transportation needs - non-medical: None  Occupational History  . None  Tobacco Use  . Smoking status: Never Smoker  . Smokeless tobacco: Never Used  Substance and Sexual Activity  . Alcohol use: Yes    Alcohol/week: 3.6 oz    Types: 6 Cans of beer per week    Comment: nothing since the 14th of january  . Drug use: No  . Sexual activity: Not Currently  Other Topics Concern  . None  Social History Narrative   Single, lives alone   Daughter,Jeff [name] is primary caregiver   Family History  Problem Relation Age of Onset  . Cancer Mother    Scheduled Meds: . docusate sodium  100 mg Oral BID  . feeding supplement (ENSURE ENLIVE)  120 mL Oral TID  . gabapentin  100 mg Oral TID  . mirtazapine  15 mg Oral QHS  . multivitamin with minerals  1 tablet Oral Daily   Continuous Infusions: . heparin 1,000 Units/hr (01/02/18 0833)   PRN Meds:.acetaminophen, HYDROmorphone (DILAUDID) injection, ondansetron, oxyCODONE, sodium chloride flush Medications Prior to Admission:  Prior to Admission medications   Medication Sig Start Date End Date Taking? Authorizing Provider  diphenoxylate-atropine (LOMOTIL) 2.5-0.025 MG tablet Take 1-2 tablets by mouth 4 (four) times daily as needed for diarrhea or loose stools. 12/13/17  Yes [name], MD  docusate sodium (COLACE) 100 MG capsule Take 100 mg by mouth 2 (two) times daily.   Yes [provider]  gabapentin (NEURONTIN) 100 MG capsule Take 1 capsule (100 mg total) 3 (three) times daily by mouth. 11/15/17  Yes [name], MD  Nutritional Supplements (NUTRITIONAL SUPPLEMENT PO) House Supplement -  give 120 ml by mouth three times daily between meals for supplement   Yes [provider]  Oxycodone HCl 10 MG TABS Take 1 tablet (10 mg total) by mouth every 6 (six) hours as needed. 12/13/17  Yes [name], MD  Clindamycin Phos-Benzoyl Perox gel Apply 1 application topically 2 (two) times daily. Patient not taking: Reported on 12/30/2017 12/27/17   [name], MD  ferrous sulfate 325 (65 FE) MG tablet Take 1 tablet (325 mg total) by mouth 2 (two) times daily with a meal. Patient not taking: Reported on 12/27/2017 03/29/17   Maryanna Shape, NP  hydrocortisone 2.5 % cream Apply topically 2 (two) times daily. Patient not taking: Reported on 12/30/2017  10/18/17   Alla Feeling, NP  loperamide (IMODIUM A-D) 2 MG tablet Take 1 tablet (2 mg total) by mouth 4 (four) times daily as needed for diarrhea or loose stools. Patient not taking: Reported on 12/30/2017 03/02/17   [name], MD  magnesium oxide (MAG-OX) 400 (241.3 Mg) MG tablet Take 1 tablet (400 mg total) by mouth 2 (two) times daily. Patient not taking: Reported on 12/30/2017 12/13/17   [name], MD  magnesium oxide (MAG-OX) 400 (241.3 Mg) MG tablet Take 1 tablet (400 mg total) by mouth daily. Patient not taking: Reported on 12/30/2017 12/27/17   [name], MD  mirtazapine (REMERON) 15 MG tablet Take 1 tablet (15 mg total) by mouth at bedtime. Patient not taking: Reported on 12/27/2017 06/22/17   Gardenia Phlegm, NP  ondansetron (ZOFRAN) 8 MG tablet Take 1 tablet (8 mg total) by mouth 2 (two) times daily as needed for refractory nausea / vomiting. Start on day 3 after chemotherapy. Patient not taking: Reported on 12/30/2017 05/25/17   [name], MD  potassium chloride SA (K-DUR,KLOR-CON) 20 MEQ tablet Take 1 tablet (20 mEq total) by mouth 2 (two) times daily. Patient not taking: Reported on 12/30/2017 12/27/17   [name], MD  prochlorperazine (COMPAZINE) 10 MG tablet Take 1 tablet (10 mg total) by mouth every 6 (six) hours as needed (Nausea  or vomiting). Patient not taking: Reported on 12/30/2017 05/25/17   [name], MD   No Known Allergies Review of Systems  Constitutional: Positive for activity change, appetite change and fatigue.  Psychiatric/Behavioral: Negative for sleep disturbance. The patient is not nervous/anxious.     Physical Exam  Constitutional: He is oriented to person, place, and time.  Thin, frail  Cardiovascular: Normal rate and regular rhythm.  Pulmonary/Chest: Effort normal and breath sounds normal.  Neurological: He is alert and oriented to person, place, and time.  Psychiatric: He has a normal mood and affect. His behavior is normal. Judgment and thought content normal.  Nursing note and vitals reviewed.   Vital Signs: BP 107/64 (BP Location: Right Arm)   Pulse 91   Temp 98.7 F (37.1 C) (Oral)   Resp 18   Ht _0  (1.753 m)   Wt 59.4 kg (131 lb)   SpO2 100%   BMI 19.35 kg/m  Pain Assessment: 0-10   Pain Score: 10-Worst pain ever   SpO2: SpO2: 100 % O2 Device:SpO2: 100 % O2 Flow Rate: .   IO: Intake/output summary:   Intake/Output Summary (Last 24 hours) at 01/02/2018 1549 Last data filed at 01/02/2018 1426 Gross per 24 hour  Intake 812.6 ml  Output 650 ml  Net 162.6 ml    LBM: Last BM Date: 12/31/17 Baseline Weight: Weight: 59.4 kg (131 lb) Most recent weight: Weight: 59.4 kg (131 lb)     Palliative Assessment/Data: PPS: 50%     Thank you for this consult. Palliative medicine will continue to follow and assist as needed.   Time In: 1500 Time Out: 1615 Time Total:  75 minutes Greater than 50%  of this time was spent counseling and coordinating care related to the above assessment and plan.  Signed by: Mariana Kaufman, AGNP-C Palliative Medicine    Please contact Palliative Medicine Team phone at [phone] for questions and concerns.  For individual provider: See Shea Evans

## 2018-01-02 NOTE — Progress Notes (Signed)
No charge note:   Palliative consult received.   Left message for patient's daughter requesting return call to arrange [place] meeting.  Mariana Kaufman, AGNP-C Palliative Medicine  Please call Palliative Medicine team phone with any questions [phone]. For individual providers please see [facility].

## 2018-01-03 LAB — CBC
HEMATOCRIT: 25.6 % — AB (ref 39.0–52.0)
HEMOGLOBIN: 8.1 g/dL — AB (ref 13.0–17.0)
MCH: 26.6 pg (ref 26.0–34.0)
MCHC: 31.6 g/dL (ref 30.0–36.0)
MCV: 83.9 fL (ref 78.0–100.0)
Platelets: 145 10*3/uL — ABNORMAL LOW (ref 150–400)
RBC: 3.05 MIL/uL — ABNORMAL LOW (ref 4.22–5.81)
RDW: 17.7 % — ABNORMAL HIGH (ref 11.5–15.5)
WBC: 5 10*3/uL (ref 4.0–10.5)

## 2018-01-03 LAB — APTT
APTT: 72 s — AB (ref 24–36)
aPTT: 82 seconds — ABNORMAL HIGH (ref 24–36)

## 2018-01-03 LAB — MAGNESIUM: Magnesium: 0.9 mg/dL — CL (ref 1.7–2.4)

## 2018-01-03 LAB — HEPARIN LEVEL (UNFRACTIONATED): Heparin Unfractionated: 0.43 IU/mL (ref 0.30–0.70)

## 2018-01-03 NOTE — Progress Notes (Signed)
   01/03/18 1500  Clinical Encounter Type  Visited With Patient  Visit Type Initial  Stress Factors  Patient Stress Factors Health changes   Rounding on patients on the palliative list.  Patient was sitting up in the chair with the news on.  Did not seem real interested in talking to anyone.  I was able to break through a little and he is ready to get better and get out of the hospital.  Talked a little about family, just doing what he needs to, to go home.  Will follow as needed. Chaplain Katherene Ponto

## 2018-01-03 NOTE — Progress Notes (Signed)
ANTICOAGULATION CONSULT NOTE  Pharmacy Consult for heparin Indication: DVT  No Known Allergies  Patient Measurements: Height: 5\' 9"  (175.3 cm) Weight: 131 lb (59.4 kg) IBW/kg (Calculated) : 70.7 Heparin Dosing Weight: 59.4 kg  Vital Signs: Temp: 99.1 F (37.3 C) (01/07 0527) Temp Source: Oral (01/07 0527) BP: 108/73 (01/07 0527) Pulse Rate: 95 (01/07 0527)  Labs: Recent Labs    01/01/18 0528  01/02/18 0537 01/02/18 1815 01/03/18 0158 01/03/18 0829  HGB 8.5*  --  8.4*  --  8.1*  --   HCT 26.5*  --  26.3*  --  25.6*  --   PLT 116*  --  139*  --  145*  --   APTT  --    < > 48* 44* 72* 82*  HEPARINUNFRC  --   --  0.51  --  0.43  --   CREATININE 0.78  --  0.69  --   --   --    < > = values in this interval not displayed.    Estimated Creatinine Clearance: 84.6 mL/min (by C-G formula based on SCr of 0.69 mg/dL).   Medical History: Past Medical History:  Diagnosis Date  . Acute deep vein thrombosis (DVT) of right lower extremity ([place]) 02/18/2017  . Malignant neoplasm of abdomen ([place])    [name] 01/11/2017  . Microcytic anemia    /notes 01/11/2017    Assessment: 57 yo M with new DVT.  Pharmacy consulted to dose low dose heparin with no bolus after pt experienced a nosebleed with xarelto.  LLE acute DVT 1/3, CT scan showed extensive thrombosis in the IVC, common iliac, left iliac vein and femoral vein  Due to his prior history of GI bleeding from his left colon mass when he was on maintenance Xarelto (did not do loading dose), he is at high risk for recurrent bleeding from anticoagulation.   He refused eliquis 1/5 AM dose with nosebleed 1/5.   Last dose 1/4 at 2149. MD request NO BOLUS and low goal.   Today, 01/03/2018:  Confirmatory aPTT = 82 sec therapeutic on heparin at 1250 units/hr   CBC: hgb low but stable since admission; Plt also decreased but improving  No bleeding reported  CrCl 85 ml/min   Goal of Therapy:  Heparin level 0.3 - 0.5 - low goal per MD  request - APTT goal 66 - 84 seconds for low goal per MD request Monitor platelets by anticoagulation protocol: Yes   Plan:   Continue heparin at 1250 units/hr as aPTT therapeutic   Daily aPTT, CBC, daily heparin level once stable  Monitor for signs of bleeding or thrombosis  Awaiting possible angiovac procedure if patient agreeable   Doreene Eland, PharmD, BCPS.   Pager: [phone] 01/03/2018 9:28 AM

## 2018-01-03 NOTE — Evaluation (Signed)
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  Clinical Impression  Pt ambulated 260' with RW in hall, distance limited by LLE pain. He had no loss of balance with ambulation and is safe to ambulate in halls independently. Encouraged pt to ambulate in halls at least TID. No further PT needed at this time, PT signing off.     Follow Up Recommendations No PT follow up    Equipment Recommendations  None recommended by PT(pt declined RW, stated "I'll be fine on my own, I don't want that stuff in my home.")    Recommendations for Other Services       Precautions / Restrictions Precautions Precautions: Fall Restrictions Weight Bearing Restrictions: No      Mobility  Bed Mobility Overal bed mobility: Modified Independent             General bed mobility comments: HOB up 25*, used rail  Transfers Overall transfer level: Needs assistance Equipment used: Rolling walker (2 wheeled) Transfers: Sit to/from Stand Sit to Stand: Supervision         General transfer comment: VCs hand placement  Ambulation/Gait Ambulation/Gait assistance: Modified independent (Device/Increase time) Ambulation Distance (Feet): 260 Feet Assistive device: Rolling walker (2 wheeled) Gait Pattern/deviations: Step-through pattern;Decreased stride length   Gait velocity interpretation: at or above normal speed for age/gender General Gait Details: VCs for positioning in RW and to push rather than lift RW  Stairs            Wheelchair Mobility    Modified Rankin (Stroke Patients Only)        Balance Overall balance assessment: Modified Independent                                           Pertinent Vitals/Pain Pain Assessment: 0-10 Pain Score: 10-Worst pain ever Pain Location: LLE with walking Pain Descriptors / Indicators: Sore Pain Intervention(s): Limited activity within patient's tolerance;Monitored during session;Premedicated before session    Home Living Family/patient expects to be discharged to:: Private residence Living Arrangements: Alone   Type of Home: Apartment Home Access: Stairs to enter Entrance Stairs-Rails: Right Entrance Stairs-Number of Steps: 4 Home Layout: One level Home Equipment: Grab bars - tub/shower      Prior Function Level of Independence: Independent               Hand Dominance   Dominant Hand: Right    Extremity/Trunk Assessment   Upper Extremity Assessment Upper Extremity Assessment: Overall WFL for tasks assessed    Lower Extremity Assessment Lower Extremity Assessment: Overall WFL for tasks assessed    Cervical / Trunk Assessment Cervical / Trunk Assessment: Normal  Communication   Communication: No difficulties  Cognition Arousal/Alertness: Awake/alert Behavior During Therapy: Flat affect Overall Cognitive Status: Within Functional Limits for tasks assessed                                        General Comments  Exercises     Assessment/Plan    PT Assessment Patent does not need any further PT services  PT Problem List         PT Treatment Interventions Gait training;DME instruction;Patient/family education    PT Goals (Current goals can be found in the Care Plan section)  Acute Rehab PT Goals Patient Stated Goal: to get back to living on my own PT Goal Formulation: With patient Time For Goal Achievement: 01/17/18 Potential to Achieve Goals: Good    Frequency Min 3X/week   Barriers to discharge        Co-evaluation                AM-PAC PT "6 Clicks" Daily Activity  Outcome Measure Difficulty turning over in bed (including adjusting bedclothes, sheets and blankets)?: None Difficulty moving from lying on back to sitting on the side of the bed? : None Difficulty sitting down on and standing up from a chair with arms (e.g., wheelchair, bedside commode, etc,.)?: None Help needed moving to and from a bed to chair (including a wheelchair)?: None Help needed walking in hospital room?: None Help needed climbing 3-5 steps with a railing? : A Little 6 Click Score: 23    End of Session Equipment Utilized During Treatment: Gait belt Activity Tolerance: Patient tolerated treatment well Patient left: in chair;with call bell/phone within reach Nurse Communication: Mobility status      Time: 5449-2010 PT Time Calculation (min) (ACUTE ONLY): 22 min   Charges:   PT Evaluation $PT Eval Low Complexity: 1 Low     PT G Codes:          Philomena Doheny 01/03/2018, 11:52 AM [phone]

## 2018-01-03 NOTE — Progress Notes (Signed)
Spoke with patient at bedside. He was not aware of having to do lovenox injections. He says he needs to discuss with his daughter. Discussed with attending who is trying to get patient to let him discuss with daughter but patient blocks discussions. Will continue to follow for d/c needs. [phone]

## 2018-01-03 NOTE — Progress Notes (Signed)
Patient Demographics:    Jeff [name], is a 57 y.o. male, [name] - 1961-02-03, TZG:[id number]  Admit date - 12/30/2017   Admitting Physician Theodis Blaze, MD  Outpatient Primary MD for the patient is [name], MD  LOS - 2  Chief Complaint  Patient presents with  . Leg Swelling        Subjective:    Jeff [name] today has no fevers, no emesis,  No chest pain,  Poor appetite, Lt leg pain is better, no diarrhea , RN at bedside  Assessment  & Plan :    Active Problems:   DVT (deep venous thrombosis) (HCC)   Malignant neoplasm of colon [facility])   Palliative care by specialist   Advance care planning  Brief Summary:- 57 y.o. 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   Plan:- 1) Left low extremity DVT, acute-continue IV heparin drip per Dr Burr Medico, at this time patient is not sure he wants interventional radiology to do thrombectomy procedure, CT scan showed extensive thrombosis in the IVC, common iliac, left iliac vein and femoral vein-  prior history of GI bleeding from his left colon mass when he was on maintenance Xarelto, he is at high risk for recurrent bleeding from anticoagulation, but patient left lower extremity DVT is very symptomatic and painful, patient had nosebleeds on 01/01/2018 after starting Eliquis on 12/31/2017. If pt is agreeable Dr. Laurence Ferrari from IR will consider thrombectomy with a device, and he prefer pt to stay on full dose anticoagulation for 4-6 weeks before we switch to prophylactic dose anticoagulation.  Postprocedure patient will be switched to Lovenox prior to discharge home.  HemoGlobin is 8.1, platelet is 145k   2)Metastatic  adenocarcinoma of descending colon to peritoneum, on palliative chemotherapy 5-FU and Panitumumab-last chemo session 11/27/2017, follows with Dr Burr Medico  3)Acute on chronic chronic anemia of chronic disease-hemoglobin is down to 8.4, in the setting of underlying colon cancer and chemotherapy anemia of neoplasm, and anemia secondary to chemotherapy, high risk for bleeding and further blood loss and on anticoagulation watch closely and transfuse as clinically indicated  4)Moderate Protein and calorie malnutrition with Anorexia-supplements advised dietitian consult requested, c/n Remeron for appetite stimulation   5)Social/Ethics-patient is a full code, palliative care consult requested, trying to reach patient's daughter however patient has not given permission to talk to his daughter.  Psychiatric consult to help determine capacity to make decisions requested and pending  6)FEN-  Hypokalemia/Hypomagnesemia-continue to replace and recheck   Disposition Plan  : pt lived alone PTA  Consults  :  Palliative/IR/Oncology/Psychiatry  DVT Prophylaxis  :  iv Heparin   Lab Results  Component Value Date   PLT 145 (L) 01/03/2018   Inpatient Medications  Scheduled Meds: . docusate sodium  100 mg Oral BID  . feeding supplement (ENSURE ENLIVE)  120 mL Oral TID  . gabapentin  100 mg Oral TID  . mirtazapine  15 mg Oral QHS  . multivitamin with minerals  1 tablet Oral Daily   Continuous Infusions: .  heparin 1,250 Units/hr (01/03/18 1528)   PRN Meds:.acetaminophen, HYDROmorphone (DILAUDID) injection, ondansetron, oxyCODONE, sodium chloride flush   Anti-infectives (From admission, onward)   None        Objective:   Vitals:   01/02/18 1425 01/02/18 2149 01/03/18 0527 01/03/18 1313  BP: 107/64 122/80 108/73 116/79  Pulse: 91 (!) 102 95 94  Resp: 18 17 16 16   Temp: 98.7 F (37.1 C) 99.1 F (37.3 C) 99.1 F (37.3 C) 99 F (37.2 C)  TempSrc: Oral Oral Oral Oral  SpO2: 100% 100% 100% 100%    Weight:      Height:        Wt Readings from Last 3 Encounters:  12/31/17 59.4 kg (131 lb)  12/27/17 59.7 kg (131 lb 11.2 oz)  12/13/17 57.5 kg (126 lb 12.8 oz)     Intake/Output Summary (Last 24 hours) at 01/03/2018 1921 Last data filed at 01/03/2018 1313 Gross per 24 hour  Intake 1256.88 ml  Output 300 ml  Net 956.88 ml   Physical Exam  Gen:- Awake Alert,  In no apparent distress  HEENT:- Jeff [name], No sclera icterus Neck-Supple Neck,No JVD,.  Lungs-  CTAB  CV- S1, S2 normal Abd-  +ve B.Sounds, Abd Soft, No tenderness, colostomy Extremity/Skin:- Lt LE warmth, swelling, tenderness, extending from the upper thigh all the way into the foot Psych-affect is flat Neuro-no new focal neuro deficits , ambulated with therapist    Data Review:   Micro Results Recent Results (from the past 240 hour(s))  Urine culture     Status: None   Collection Time: 12/30/17  8:50 PM  Result Value Ref Range Status   Specimen Description URINE, CLEAN CATCH  Final   Special Requests NONE  Final   Culture   Final    NO GROWTH Performed at [facility], [address] [place], [place] [address]    Report Status 01/01/2018 FINAL  Final    Radiology Reports Ct Abdomen Pelvis W Contrast  Result Date: 12/30/2017 CLINICAL DATA:  57 year old male with abdominal, left pelvic and left leg pain and swelling for several days. History of stage IV colon cancer and currently on chemotherapy. EXAM: CT ABDOMEN AND PELVIS WITH CONTRAST TECHNIQUE: Multidetector CT imaging of the abdomen and pelvis was performed using the standard protocol following bolus administration of intravenous contrast. CONTRAST:  140mL ISOVUE-300 IOPAMIDOL (ISOVUE-300) INJECTION 61% COMPARISON:  11/25/2017 CT and prior studies FINDINGS: Lower chest: No acute abnormality. Hepatobiliary: No significant hepatic or gallbladder abnormalities. No biliary dilatation. Pancreas: Unremarkable Spleen: A 4.2 x 5.9 cm complex cystic mass in the  splenic hilum is unchanged. No new abnormalities identified. Adrenals/Urinary Tract: The kidneys, adrenal glands and bladder are unremarkable except for mild circumferential bladder wall thickening. 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 Wall thickening of the colon at the splenic flexure is unchanged. No bowel obstruction, other definite bowel wall thickening or inflammatory changes. Vascular/Lymphatic: An infrarenal IVC filter is noted with thrombus in the IVC, common iliac veins, left iliac and femoral veins. There is enlargement and inflammation along the left iliac and common femoral vein since the prior study. Aortic atherosclerotic calcifications noted without aneurysm. No definite enlarged lymph nodes. Reproductive: No definite prostate abnormality Other: Stranding in the subcutaneous and mesenteric fat noted. A trace amount of free fluid within the pelvis is noted. No pneumoperitoneum or definite abscess. Musculoskeletal: No acute abnormality or suspicious bony lesion. IMPRESSION: 1. 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 However, there  is now increasing thrombus within the left iliac and femoral veins with adjacent inflammation. 2. Mild circumferential bladder wall thickening which may be reactive or could indicate cystitis. Correlate clinically. 3. Unchanged complex cystic mass in the splenic hilum and focal wall thickening of the splenic flexure. 4.  Aortic Atherosclerosis (ICD10-I70.0). Electronically Signed   By: Margarette Canada M.D.   On: 12/30/2017 20:36    CBC Recent Labs  Lab 12/30/17 1907 01/01/18 0528 01/02/18 0537 01/03/18 0158  WBC 6.4 4.9 4.8 5.0  HGB 9.4* 8.5* 8.4* 8.1*  HCT 29.3* 26.5* 26.3* 25.6*  PLT 156 116* 139* 145*  MCV 83.0 83.3 83.8 83.9  MCH 26.6 26.7 26.8 26.6  MCHC 32.1 32.1 31.9 31.6  RDW 17.7* 17.5* 17.7* 17.7*  LYMPHSABS 1.3  --   --   --   MONOABS 0.4  --   --   --   EOSABS 0.1  --   --   --   BASOSABS 0.1  --   --   --      Chemistries  Recent Labs  Lab 12/30/17 1907 01/01/18 0528 01/02/18 0537  NA 134* 135 132*  K 3.4* 4.0 3.5  CL 100* 101 99*  CO2 26 28 26   GLUCOSE 109* 106* 101*  BUN 9 10 8   CREATININE 0.80 0.78 0.69  CALCIUM 8.2* 8.2* 8.1*  MG  --  1.1* 0.9*  AST 22  --   --   ALT 8*  --   --   ALKPHOS 66  --   --   BILITOT 0.7  --   --    ------------------------------------------------------------------------------------------------------------------ No results for input(s): CHOL, HDL, LDLCALC, TRIG, CHOLHDL, LDLDIRECT in the last 72 hours.  No results found for: HGBA1C ------------------------------------------------------------------------------------------------------------------ No results for input(s): TSH, T4TOTAL, T3FREE, THYROIDAB in the last 72 hours.  Invalid input(s): FREET3 ------------------------------------------------------------------------------------------------------------------ No results for input(s): VITAMINB12, FOLATE, FERRITIN, TIBC, IRON, RETICCTPCT in the last 72 hours.  Coagulation profile No results for input(s): INR, PROTIME in the last 168 hours.  No results for input(s): DDIMER in the last 72 hours.  Cardiac Enzymes No results for input(s): CKMB, TROPONINI, MYOGLOBIN in the last 168 hours.  Invalid input(s): CK ------------------------------------------------------------------------------------------------------------------ No results found for: BNP   Roxan Hockey M.D on 01/03/2018 at 7:21 PM  Between 7am to 7pm - Pager - [phone]  After 7pm go to [email or web address] - password [email or web address]  [facility] -  Office  [phone]   Voice Recognition Viviann Spare dictation system was used to create this note, attempts have been made to correct errors. Please contact the author with questions and/or clarifications.

## 2018-01-03 NOTE — Evaluation (Signed)
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   Clinical Impression   Patient evaluated by Occupational Therapy with no further acute OT needs identified. All education has been completed and the patient has no further questions. Pt is able to perform ADLs mod I.   See below for any follow-up Occupational Therapy or equipment needs. OT is signing off. Thank you for this referral.      Follow Up Recommendations  No OT follow up;Supervision - Intermittent    Equipment Recommendations  None recommended by OT    Recommendations for Other Services       Precautions / Restrictions        Mobility Bed Mobility                  Transfers Overall transfer level: Modified independent                    Balance Overall balance assessment: Modified Independent                                         ADL either performed or assessed with clinical judgement   ADL Overall ADL's : Modified independent                                             Vision         Perception     Praxis      Pertinent Vitals/Pain Pain Assessment: No/denies pain     Hand Dominance Right   Extremity/Trunk Assessment Upper Extremity Assessment Upper Extremity Assessment: Overall WFL for tasks assessed   Lower Extremity Assessment Lower Extremity Assessment: Defer to PT evaluation   Cervical / Trunk Assessment Cervical / Trunk Assessment: Normal   Communication Communication Communication: No difficulties   Cognition Arousal/Alertness:  Awake/alert Behavior During Therapy: Flat affect Overall Cognitive Status: Within Functional Limits for tasks assessed                                     General Comments  Pt reports he has been ambulating in hallways.  He states he is able to perform ADLs without difficulty.  He denies fatigue from chemo, and is not interested in DME     Exercises     Shoulder Instructions      Home Living Family/patient expects to be discharged to:: Private residence Living Arrangements: Alone Available Help at Discharge: Family;Available PRN/intermittently Type of Home: Apartment Home Access: Stairs to enter Entrance Stairs-Number of Steps: 4 Entrance Stairs-Rails: Right Home Layout: One level     Bathroom Shower/Tub: Teacher, early years/pre: Standard     Home Equipment: Grab bars - tub/shower          Prior Functioning/Environment Level of Independence: Independent        Comments: Pt very evasive  with questioning         OT Problem List: Decreased activity tolerance      OT Treatment/Interventions:      OT Goals(Current goals can be found in the care plan section) Acute Rehab OT Goals Patient Stated Goal: go home  OT Goal Formulation: All assessment and education complete, DC therapy  OT Frequency:     Barriers to D/C:            Co-evaluation              AM-PAC PT "6 Clicks" Daily Activity     Outcome Measure Help from another person eating meals?: None Help from another person taking care of personal grooming?: None Help from another person toileting, which includes using toliet, bedpan, or urinal?: None Help from another person bathing (including washing, rinsing, drying)?: None Help from another person to put on and taking off regular upper body clothing?: None Help from another person to put on and taking off regular lower body clothing?: None 6 Click Score: 24   End of Session    Activity Tolerance: Patient tolerated  treatment well Patient left: in chair;with call bell/phone within reach  OT Visit Diagnosis: Muscle weakness (generalized) (M62.81)                Time: 7673-4193 OT Time Calculation (min): 8 min Charges:  OT General Charges $OT Visit: 1 Visit OT Evaluation $OT Eval Low Complexity: 1 Low G-Codes:     [facility], OTR/L [phone]   Lucille Passy M 01/03/2018, 7:09 PM

## 2018-01-03 NOTE — Progress Notes (Signed)
ANTICOAGULATION CONSULT NOTE - Follow Up Consult  Pharmacy Consult for Heparin Indication: DVT  No Known Allergies  Patient Measurements: Height: 5\' 9"  (175.3 cm) Weight: 131 lb (59.4 kg) IBW/kg (Calculated) : 70.7 Heparin Dosing Weight:   Vital Signs: Temp: 99.1 F (37.3 C) (01/06 2149) Temp Source: Oral (01/06 2149) BP: 122/80 (01/06 2149) Pulse Rate: 102 (01/06 2149)  Labs: Recent Labs    01/01/18 0528  01/02/18 0537 01/02/18 1815 01/03/18 0158  HGB 8.5*  --  8.4*  --  8.1*  HCT 26.5*  --  26.3*  --  25.6*  PLT 116*  --  139*  --  145*  APTT  --    < > 48* 44* 72*  HEPARINUNFRC  --   --  0.51  --  0.43  CREATININE 0.78  --  0.69  --   --    < > = values in this interval not displayed.    Estimated Creatinine Clearance: 84.6 mL/min (by C-G formula based on SCr of 0.69 mg/dL).   Medications:  Infusions:  . heparin 1,250 Units/hr (01/02/18 2017)    Assessment: Patient with PTT at goal.  No heparin issues noted.  Goal of Therapy:  Heparin level 0.3 - 0.5 - low goal per MD request - APTT goal 66 - 84 seconds for low goal per MD request  Monitor platelets by anticoagulation protocol: Yes   Plan:  Continue heparin drip at current rate Recheck levels at 0800  Tyler Deis, Shea Stakes Crowford 01/03/2018,4:56 AM

## 2018-01-03 NOTE — Progress Notes (Signed)
CSW consult- Possible needs w/ ambulation   Patient was evaluated by PT-No PT follow up recommendation.  CSW signing off.   Kathrin Greathouse, Latanya Presser, MSW Clinical Social Worker  [phone] 01/03/2018  1:46 PM

## 2018-01-04 DIAGNOSIS — F1010 Alcohol abuse, uncomplicated: Secondary | ICD-10-CM

## 2018-01-04 DIAGNOSIS — R4587 Impulsiveness: Secondary | ICD-10-CM

## 2018-01-04 DIAGNOSIS — Z008 Encounter for other general examination: Secondary | ICD-10-CM

## 2018-01-04 LAB — CBC
HCT: 26.5 % — ABNORMAL LOW (ref 39.0–52.0)
HEMOGLOBIN: 8.3 g/dL — AB (ref 13.0–17.0)
MCH: 26.4 pg (ref 26.0–34.0)
MCHC: 31.3 g/dL (ref 30.0–36.0)
MCV: 84.4 fL (ref 78.0–100.0)
Platelets: 151 10*3/uL (ref 150–400)
RBC: 3.14 MIL/uL — AB (ref 4.22–5.81)
RDW: 17.6 % — ABNORMAL HIGH (ref 11.5–15.5)
WBC: 5.4 10*3/uL (ref 4.0–10.5)

## 2018-01-04 LAB — HEPARIN LEVEL (UNFRACTIONATED): Heparin Unfractionated: 0.53 IU/mL (ref 0.30–0.70)

## 2018-01-04 LAB — APTT: APTT: 91 s — AB (ref 24–36)

## 2018-01-04 MED ORDER — OXYCODONE HCL 5 MG PO TABS
10.0000 mg | ORAL_TABLET | ORAL | Status: DC | PRN
  Administered 2018-01-04 – 2018-01-06 (×7): 10 mg via ORAL
  Filled 2018-01-04 (×7): qty 2

## 2018-01-04 MED ORDER — HEPARIN (PORCINE) IN NACL 100-0.45 UNIT/ML-% IJ SOLN
800.0000 [IU]/h | INTRAMUSCULAR | Status: DC
  Administered 2018-01-04 – 2018-01-06 (×3): 1200 [IU]/h via INTRAVENOUS
  Administered 2018-01-07: 800 [IU]/h via INTRAVENOUS
  Filled 2018-01-04 (×6): qty 250

## 2018-01-04 NOTE — Progress Notes (Signed)
ANTICOAGULATION CONSULT NOTE  Pharmacy Consult for heparin Indication: DVT  No Known Allergies  Patient Measurements: Height: 5\' 9"  (175.3 cm) Weight: 131 lb (59.4 kg) IBW/kg (Calculated) : 70.7 Heparin Dosing Weight: 59.4 kg  Vital Signs: Temp: 98.9 F (37.2 C) (01/08 0706) Temp Source: Oral (01/08 0706) BP: 100/68 (01/08 0706) Pulse Rate: 95 (01/08 0706)  Labs: Recent Labs    01/02/18 0537  01/03/18 0158 01/03/18 0829 01/04/18 0615  HGB 8.4*  --  8.1*  --  8.3*  HCT 26.3*  --  25.6*  --  26.5*  PLT 139*  --  145*  --  151  APTT 48*   < > 72* 82* 91*  HEPARINUNFRC 0.51  --  0.43  --  0.53  CREATININE 0.69  --   --   --   --    < > = values in this interval not displayed.    Estimated Creatinine Clearance: 84.6 mL/min (by C-G formula based on SCr of 0.69 mg/dL).   Medical History: Past Medical History:  Diagnosis Date  . Acute deep vein thrombosis (DVT) of right lower extremity ([place]) 02/18/2017  . Malignant neoplasm of abdomen ([place])    [name] 01/11/2017  . Microcytic anemia    /notes 01/11/2017    Assessment: 57 yo M with new DVT.  Pharmacy consulted to dose low dose heparin with no bolus after pt experienced a nosebleed with xarelto.  LLE acute DVT 1/3, CT scan showed extensive thrombosis in the IVC, common iliac, left iliac vein and femoral vein  Due to his prior history of GI bleeding from his left colon mass when he was on maintenance Xarelto (did not do loading dose), he is at high risk for recurrent bleeding from anticoagulation.   He refused eliquis 1/5 AM dose with nosebleed 1/5.   Last dose 1/4 at 2149. MD request NO BOLUS and low goal.   Today, 01/04/2018:  Heparin level =  0.53 (aPTT = 91 sec) - both now correlating following one-time dose of apixaban.  Level slightly SUPRAtherapeutic for conservative goal d/t previous bleeding  CBC: hgb low but stable since admission; Pltc improved to WNL  No bleeding reported  CrCl 85 ml/min   Goal of  Therapy:  Heparin level 0.3 - 0.5 - low goal per MD request - APTT goal 66 - 84 seconds for low goal per MD request Monitor platelets by anticoagulation protocol: Yes   Plan:   Reduce heparin to 1200 units/hr   Daily CBC, daily heparin level   Monitor for signs of bleeding or thrombosis  Awaiting possible angiovac procedure if patient agreeable   Await conversion to anticoagulation to be used at discharge (? LMWH)  Doreene Eland, PharmD, BCPS.   Pager: [phone] 01/04/2018 7:13 AM

## 2018-01-04 NOTE — Progress Notes (Addendum)
Jeff [name]   [name]:1961/06/11   LN#:[id number]   DEY#:[id number]  ONCOLOGY FOLLOW UP   Subjective: Pt had mild bleeding at IV site in left arm , stopped. No other bleedings on heparin. He has been ambulating in the hallway without much difficulty, his left lower extremity edema and pain has improved.   Objective:  Vitals:   01/04/18 0706 01/04/18 1328  BP: 100/68 107/68  Pulse: 95 (!) 101  Resp: 17 18  Temp: 98.9 F (37.2 C) 98.5 F (36.9 C)  SpO2: 100% 100%    Body mass index is 19.35 kg/m.  Intake/Output Summary (Last 24 hours) at 01/04/2018 2135 Last data filed at 01/04/2018 1856 Gross per 24 hour  Intake 990 ml  Output 650 ml  Net 340 ml     Sclerae unicteric  Oropharynx clear  No peripheral adenopathy  Lungs clear -- no rales or rhonchi  Heart regular rate and rhythm  Abdomen benign  MSK no focal spinal tenderness, no peripheral edema  Neuro nonfocal   CBG (last 3)  No results for input(s): GLUCAP in the last 72 hours.   Labs:  Lab Results  Component Value Date   WBC 5.4 01/04/2018   HGB 8.3 (L) 01/04/2018   HCT 26.5 (L) 01/04/2018   MCV 84.4 01/04/2018   PLT 151 01/04/2018   NEUTROABS 4.6 12/30/2017   CMP Latest Ref Rng & Units 01/02/2018 01/01/2018 12/30/2017  Glucose 65 - 99 mg/dL 101(H) 106(H) 109(H)  BUN 6 - 20 mg/dL 8 10 9   Creatinine 0.61 - 1.24 mg/dL 0.69 0.78 0.80  Sodium 135 - 145 mmol/L 132(L) 135 134(L)  Potassium 3.5 - 5.1 mmol/L 3.5 4.0 3.4(L)  Chloride 101 - 111 mmol/L 99(L) 101 100(L)  CO2 22 - 32 mmol/L 26 28 26   Calcium 8.9 - 10.3 mg/dL 8.1(L) 8.2(L) 8.2(L)  Total Protein 6.5 - 8.1 g/dL - - 8.4(H)  Total Bilirubin 0.3 - 1.2 mg/dL - - 0.7  Alkaline Phos 38 - 126 U/L - - 66  AST 15 - 41 U/L - - 22  ALT 17 - 63 U/L - - 8(L)     Urine Studies No results for input(s): UHGB, CRYS in the last 72 hours.  Invalid input(s): UACOL, UAPR, USPG, UPH, UTP, UGL, UKET, UBIL, UNIT, UROB, ULEU, UEPI, UWBC, URBC, UBAC, CAST, UCOM, [place]  Basic  Metabolic Panel: Recent Labs  Lab 12/30/17 1907 01/01/18 0528 01/02/18 0537  NA 134* 135 132*  K 3.4* 4.0 3.5  CL 100* 101 99*  CO2 26 28 26   GLUCOSE 109* 106* 101*  BUN 9 10 8   CREATININE 0.80 0.78 0.69  CALCIUM 8.2* 8.2* 8.1*  MG  --  1.1* 0.9*   GFR Estimated Creatinine Clearance: 84.6 mL/min (by C-G formula based on SCr of 0.69 mg/dL). Liver Function Tests: Recent Labs  Lab 12/30/17 1907  AST 22  ALT 8*  ALKPHOS 66  BILITOT 0.7  PROT 8.4*  ALBUMIN 3.2*   No results for input(s): LIPASE, AMYLASE in the last 168 hours. No results for input(s): AMMONIA in the last 168 hours. Coagulation profile No results for input(s): INR, PROTIME in the last 168 hours.  CBC: Recent Labs  Lab 12/30/17 1907 01/01/18 0528 01/02/18 0537 01/03/18 0158 01/04/18 0615  WBC 6.4 4.9 4.8 5.0 5.4  NEUTROABS 4.6  --   --   --   --   HGB 9.4* 8.5* 8.4* 8.1* 8.3*  HCT 29.3* 26.5* 26.3* 25.6* 26.5*  MCV 83.0 83.3  83.8 83.9 84.4  PLT 156 116* 139* 145* 151   Cardiac Enzymes: No results for input(s): CKTOTAL, CKMB, CKMBINDEX, TROPONINI in the last 168 hours. BNP: Invalid input(s): POCBNP CBG: No results for input(s): GLUCAP in the last 168 hours. D-Dimer No results for input(s): DDIMER in the last 72 hours. Hgb A1c No results for input(s): HGBA1C in the last 72 hours. Lipid Profile No results for input(s): CHOL, HDL, LDLCALC, TRIG, CHOLHDL, LDLDIRECT in the last 72 hours. Thyroid function studies No results for input(s): TSH, T4TOTAL, T3FREE, THYROIDAB in the last 72 hours.  Invalid input(s): FREET3 Anemia work up No results for input(s): VITAMINB12, FOLATE, FERRITIN, TIBC, IRON, RETICCTPCT in the last 72 hours. Microbiology Recent Results (from the past 240 hour(s))  Urine culture     Status: None   Collection Time: 12/30/17  8:50 PM  Result Value Ref Range Status   Specimen Description URINE, CLEAN CATCH  Final   Special Requests NONE  Final   Culture   Final    NO  GROWTH Performed at [facility], [address] [place], [place] [address]    Report Status 01/01/2018 FINAL  Final      Studies:  No results found.  Assessment: 57 y.o. 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  1. extensive Left low extremity DVT, acute, CT scan showed extensive thrombosis in the IVC, common iliac, left iliac vein and femoral vein 2.  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 Left leg edema and pain, difficulty walking due to #1 4. Anemia of neoplasm, and anemia secondary to chemotherapy 5.  Moderate protein and calorie malnutrition  Plan:  -Pt has been tolerating IV heparin anticoagulation well without significant bleeding, his left leg edema and pain has improved.  He is able to ambulate, likely can take care of himself at home.  -Again discussed the benefit and risks of thrombectomy with angiovac by IR. Pt seems to have some difficulty to make the decision. Since his symptoms is improving, I do not feel it is urgent to have the procedure done, we discussed the option of discharging home with anticoagulation, if his symptom does not resolve, he can elect to have the procedure done in the next few weeks.  -Pt feels he has been rushed to make the decision about the procedure, although I clearly explained to him about the option of medical anticoagulation management alone.  He does not seem to understand the complexity of procedure, he has asked his daughter to help make the decision, and she has clearly supported his decision. I met both pt and his daughter at bed side today -After a lengthy discussion, both patient and his daughter decided to proceed  with the thrombectomy with angiovac by IR during this hospital stay.  I have informed Dr. Laurence Ferrari about his decision, and Dr. Laurence Ferrari plans to have it done at [facility] on Thursday 1/10.  -please obtain a brain MRI to rule out brain mets, per Dr. Katrinka Blazing recommendation, and I have take the liberty to order it. -Please transfer pt to [facility] late tomorrow when you confirm with IR about the procedure  -Pt agrees to continue anticoagulation after procedure, he understands we recommend Lovenox injection, he  is not opposed to it. -I will f/u as needed    [name], MD 01/04/2018  9:35 PM

## 2018-01-04 NOTE — Progress Notes (Signed)
Patient Demographics:    Jeff [name], is a 57 y.o. male, [name] - 12-12-1961, IFO:[id number]  Admit date - 12/30/2017   Admitting Physician Theodis Blaze, MD  Outpatient Primary MD for the patient is [name], MD  LOS - 3  Chief Complaint  Patient presents with  . Leg Swelling        Subjective:    [name] today has no fevers, no emesis,  No chest pain,  Poor appetite, Lt leg pain is better, no diarrhea , RN and Pt's daughter Ms Jeff [name] at bedside, was able to ambulate in the hallways today,   Assessment  & Plan :    Principal Problem:   Evaluation by psychiatric service required Active Problems:   DVT (deep venous thrombosis) ([place])   Malignant neoplasm of colon [facility])   Palliative care by specialist   Advance care planning  Brief Summary:- 57 y.o. 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 both patient and his daughter decided to proceed with the thrombectomy with angiovac by IR during this hospital stay.  Dr. Laurence Ferrari plans to have it done at [facility] on Thursday 01/06/18 as long as brain MRI is w/o  brain mets.  Please call Dr. Laurence Ferrari on 01/05/18 to confirm that the procedure is still scheduled at [facility] on 01/06/18 and if confirmed then please  transfer pt to [facility] late on 01/05/18  .  Patient has poor decision-making capacity please contact patient's daughter Ms Jeff [name] if consent for procedures if needed, please see psychiatric consult note dated 01/04/2018    Plan:- 1) Left low extremity DVT, acute-continue IV heparin drip per Dr Burr Medico, at this time patient is not sure he wants interventional radiology to do thrombectomy procedure, CT scan showed  extensive thrombosis in the IVC, common iliac, left iliac vein and femoral vein-  prior history of GI bleeding from his left colon mass when he was on maintenance Xarelto, he is at high risk for recurrent bleeding from anticoagulation, but patient left lower extremity DVT is very symptomatic and painful, patient had nosebleeds on 01/01/2018 after starting Eliquis on 12/31/2017.  Currently doing okay on IV heparin drip, hemoGlobin is 8.3, platelet is 151k  -After a lengthy discussion, both patient and his daughter decided to proceed with the thrombectomy with angiovac by IR during this hospital stay.  Dr. Laurence Ferrari plans to have it done at [facility] on Thursday 01/06/18 as long as brain MRI is w/o  brain mets.  Please call Dr. Laurence Ferrari on 01/05/18 to confirm that the procedure is still scheduled at [facility] on 01/06/18 and if confirmed then please  transfer pt to [facility] late on 01/05/18 . Pt agrees to continue anticoagulation after procedure, he understands we recommend Lovenox injection, he is not opposed to it.   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, on palliative chemotherapy 5-FU and Panitumumab-last chemo session 11/27/2017, follows with Dr Burr Medico  3)Acute on chronic chronic anemia of chronic disease-hemoglobin is down to 8.3, in the setting of underlying  colon cancer and chemotherapy anemia of neoplasm, and anemia secondary to chemotherapy, high risk for bleeding and further blood loss and on anticoagulation watch closely and transfuse as clinically indicated  4)Moderate Protein and calorie malnutrition with Anorexia-supplements advised dietitian consult requested, c/n Remeron for appetite stimulation   5)Social/Ethics-patient is a full code, palliative care consult requested, trying to reach patient's daughter however patient has not given permission to talk to his daughter.  Psychiatric consult to help determine capacity to make decisions  noted from Dr Mariea Clonts on 01/04/18.  Patient has  poor decision-making capacity please contact patient's daughter Ms Jeff [name] if consent for procedures if needed, please see psychiatric consult note dated 01/04/2018  6)FEN-  Hypokalemia/Hypomagnesemia-continue to replace and recheck   Disposition Plan  : pt lived alone PTA  Consults  :  Palliative/IR/Oncology/Psychiatry  DVT Prophylaxis  :  iv Heparin   Lab Results  Component Value Date   PLT 151 01/04/2018   Inpatient Medications  Scheduled Meds: . docusate sodium  100 mg Oral BID  . feeding supplement (ENSURE ENLIVE)  120 mL Oral TID  . gabapentin  100 mg Oral TID  . mirtazapine  15 mg Oral QHS  . multivitamin with minerals  1 tablet Oral Daily   Continuous Infusions: . heparin 1,200 Units/hr (01/04/18 1236)   PRN Meds:.acetaminophen, HYDROmorphone (DILAUDID) injection, ondansetron, oxyCODONE, sodium chloride flush   Anti-infectives (From admission, onward)   None       Objective:   Vitals:   01/03/18 1313 01/03/18 2103 01/04/18 0706 01/04/18 1328  BP: 116/79 107/76 100/68 107/68  Pulse: 94 93 95 (!) 101  Resp: 16 18 17 18   Temp: 99 F (37.2 C) 99.3 F (37.4 C) 98.9 F (37.2 C) 98.5 F (36.9 C)  TempSrc: Oral Oral Oral Oral  SpO2: 100% 100% 100% 100%  Weight:      Height:        Wt Readings from Last 3 Encounters:  12/31/17 59.4 kg (131 lb)  12/27/17 59.7 kg (131 lb 11.2 oz)  12/13/17 57.5 kg (126 lb 12.8 oz)     Intake/Output Summary (Last 24 hours) at 01/04/2018 2225 Last data filed at 01/04/2018 1856 Gross per 24 hour  Intake 990 ml  Output 650 ml  Net 340 ml   Physical Exam  Gen:- Awake Alert,  In no apparent distress  HEENT:- [place].AT, No sclera icterus Neck-Supple Neck,No JVD,.  Lungs-  CTAB  CV- S1, S2 normal Abd-  +ve B.Sounds, Abd Soft, No tenderness, colostomy Extremity/Skin:- Lt LE warmth,less  Swelling, less tenderness, extending from the upper thigh all the way into the foot Psych-affect is flat Neuro-no new focal neuro deficits ,  ambulated with therapist    Data Review:   Micro Results Recent Results (from the past 240 hour(s))  Urine culture     Status: None   Collection Time: 12/30/17  8:50 PM  Result Value Ref Range Status   Specimen Description URINE, CLEAN CATCH  Final   Special Requests NONE  Final   Culture   Final    NO GROWTH Performed at [facility], [address], [place], [place] [address]    Report Status 01/01/2018 FINAL  Final   Radiology Reports Ct Abdomen Pelvis W Contrast  Result Date: 12/30/2017 CLINICAL DATA:  57 year old male with abdominal, left pelvic and left leg pain and swelling for several days. History of stage IV colon cancer and currently on chemotherapy. EXAM: CT ABDOMEN AND PELVIS WITH CONTRAST TECHNIQUE:  Multidetector CT imaging of the abdomen and pelvis was performed using the standard protocol following bolus administration of intravenous contrast. CONTRAST:  195mL ISOVUE-300 IOPAMIDOL (ISOVUE-300) INJECTION 61% COMPARISON:  11/25/2017 CT and prior studies FINDINGS: Lower chest: No acute abnormality. Hepatobiliary: No significant hepatic or gallbladder abnormalities. No biliary dilatation. Pancreas: Unremarkable Spleen: A 4.2 x 5.9 cm complex cystic mass in the splenic hilum is unchanged. No new abnormalities identified. Adrenals/Urinary Tract: The kidneys, adrenal glands and bladder are unremarkable except for mild circumferential bladder wall thickening. 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 Wall thickening of the colon at the splenic flexure is unchanged. No bowel obstruction, other definite bowel wall thickening or inflammatory changes. Vascular/Lymphatic: An infrarenal IVC filter is noted with thrombus in the IVC, common iliac veins, left iliac and femoral veins. There is enlargement and inflammation along the left iliac and common femoral vein since the prior study. Aortic atherosclerotic calcifications noted without aneurysm. No definite enlarged lymph  nodes. Reproductive: No definite prostate abnormality Other: Stranding in the subcutaneous and mesenteric fat noted. A trace amount of free fluid within the pelvis is noted. No pneumoperitoneum or definite abscess. Musculoskeletal: No acute abnormality or suspicious bony lesion. IMPRESSION: 1. 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 However, there is now increasing thrombus within the left iliac and femoral veins with adjacent inflammation. 2. Mild circumferential bladder wall thickening which may be reactive or could indicate cystitis. Correlate clinically. 3. Unchanged complex cystic mass in the splenic hilum and focal wall thickening of the splenic flexure. 4.  Aortic Atherosclerosis (ICD10-I70.0). Electronically Signed   By: Margarette Canada M.D.   On: 12/30/2017 20:36    CBC Recent Labs  Lab 12/30/17 1907 01/01/18 0528 01/02/18 0537 01/03/18 0158 01/04/18 0615  WBC 6.4 4.9 4.8 5.0 5.4  HGB 9.4* 8.5* 8.4* 8.1* 8.3*  HCT 29.3* 26.5* 26.3* 25.6* 26.5*  PLT 156 116* 139* 145* 151  MCV 83.0 83.3 83.8 83.9 84.4  MCH 26.6 26.7 26.8 26.6 26.4  MCHC 32.1 32.1 31.9 31.6 31.3  RDW 17.7* 17.5* 17.7* 17.7* 17.6*  LYMPHSABS 1.3  --   --   --   --   MONOABS 0.4  --   --   --   --   EOSABS 0.1  --   --   --   --   BASOSABS 0.1  --   --   --   --     Chemistries  Recent Labs  Lab 12/30/17 1907 01/01/18 0528 01/02/18 0537  NA 134* 135 132*  K 3.4* 4.0 3.5  CL 100* 101 99*  CO2 26 28 26   GLUCOSE 109* 106* 101*  BUN 9 10 8   CREATININE 0.80 0.78 0.69  CALCIUM 8.2* 8.2* 8.1*  MG  --  1.1* 0.9*  AST 22  --   --   ALT 8*  --   --   ALKPHOS 66  --   --   BILITOT 0.7  --   --    ------------------------------------------------------------------------------------------------------------------ No results for input(s): CHOL, HDL, LDLCALC, TRIG, CHOLHDL, LDLDIRECT in the last 72 hours.  No results found for:  HGBA1C ------------------------------------------------------------------------------------------------------------------ No results for input(s): TSH, T4TOTAL, T3FREE, THYROIDAB in the last 72 hours.  Invalid input(s): FREET3 ------------------------------------------------------------------------------------------------------------------ No results for input(s): VITAMINB12, FOLATE, FERRITIN, TIBC, IRON, RETICCTPCT in the last 72 hours.  Coagulation profile No results for input(s): INR, PROTIME in the last 168 hours.  No results for input(s): DDIMER in the last 72 hours.  Cardiac Enzymes No results for input(s): CKMB, TROPONINI, MYOGLOBIN in the last 168 hours.  Invalid input(s): CK ------------------------------------------------------------------------------------------------------------------ No results found for: BNP   Patient has poor decision-making capacity please contact patient's daughter Ms Jeff [name] if consent for procedures if needed, please see psychiatric consult note dated 01/04/2018   Roxan Hockey M.D on 01/04/2018 at 10:25 PM  Between 7am to 7pm - Pager - [phone]  After 7pm go to [email or web address] - password [email or web address]  [facility] -  Office  [phone]   Voice Recognition Viviann Spare dictation system was used to create this note, attempts have been made to correct errors. Please contact the author with questions and/or clarifications.
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
## 2018-01-05 ENCOUNTER — Inpatient Hospital Stay (HOSPITAL_COMMUNITY): Payer: Medicaid Other

## 2018-01-05 DIAGNOSIS — Z86718 Personal history of other venous thrombosis and embolism: Secondary | ICD-10-CM

## 2018-01-05 DIAGNOSIS — Z008 Encounter for other general examination: Secondary | ICD-10-CM

## 2018-01-05 LAB — SURGICAL PCR SCREEN
MRSA, PCR: NEGATIVE
Staphylococcus aureus: POSITIVE — AB

## 2018-01-05 LAB — CBC
HEMATOCRIT: 26.3 % — AB (ref 39.0–52.0)
Hemoglobin: 8.4 g/dL — ABNORMAL LOW (ref 13.0–17.0)
MCH: 26.9 pg (ref 26.0–34.0)
MCHC: 31.9 g/dL (ref 30.0–36.0)
MCV: 84.3 fL (ref 78.0–100.0)
PLATELETS: 180 10*3/uL (ref 150–400)
RBC: 3.12 MIL/uL — AB (ref 4.22–5.81)
RDW: 17.6 % — AB (ref 11.5–15.5)
WBC: 5.5 10*3/uL (ref 4.0–10.5)

## 2018-01-05 LAB — HEPARIN LEVEL (UNFRACTIONATED): Heparin Unfractionated: 0.49 IU/mL (ref 0.30–0.70)

## 2018-01-05 MED ORDER — GADOBENATE DIMEGLUMINE 529 MG/ML IV SOLN
15.0000 mL | Freq: Once | INTRAVENOUS | Status: AC | PRN
  Administered 2018-01-05: 12 mL via INTRAVENOUS

## 2018-01-05 MED ORDER — OXYCODONE HCL ER 10 MG PO T12A
10.0000 mg | EXTENDED_RELEASE_TABLET | Freq: Two times a day (BID) | ORAL | Status: DC
  Administered 2018-01-05 – 2018-01-06 (×3): 10 mg via ORAL
  Filled 2018-01-05 (×3): qty 1

## 2018-01-05 NOTE — Progress Notes (Signed)
   01/05/18 1500  Clinical Encounter Type  Visited With Patient  Visit Type Follow-up   Following up from a visit on Monday.  Patient was sitting up and a little more talkative.  He indicated he will go to main campus for a procedure tomorrow.  Says he is feeling better and ready to get home.  Appreciative of the visit.  Can follow him on main campus.  Chaplain Katherene Ponto

## 2018-01-05 NOTE — Progress Notes (Signed)
Referring Physician(s): Feng,Y  Supervising Physician: Jacqulynn Cadet  Patient Status:  Jeff [name] - In-pt  Chief Complaint:  Left lower extremity DVT  Subjective: Pt doing ok today; still with LLE edema but sl improved per pt; still on IV heparin  Allergies: Patient has no known allergies.  Medications: Prior to Admission medications   Medication Sig Start Date End Date Taking? Authorizing Provider  diphenoxylate-atropine (LOMOTIL) 2.5-0.025 MG tablet Take 1-2 tablets by mouth 4 (four) times daily as needed for diarrhea or loose stools. 12/13/17  Yes [name], MD  docusate sodium (COLACE) 100 MG capsule Take 100 mg by mouth 2 (two) times daily.   Yes [provider]  gabapentin (NEURONTIN) 100 MG capsule Take 1 capsule (100 mg total) 3 (three) times daily by mouth. 11/15/17  Yes [name], MD  Nutritional Supplements (NUTRITIONAL SUPPLEMENT PO) House Supplement - give 120 ml by mouth three times daily between meals for supplement   Yes [provider]  Oxycodone HCl 10 MG TABS Take 1 tablet (10 mg total) by mouth every 6 (six) hours as needed. 12/13/17  Yes [name], MD  Clindamycin Phos-Benzoyl Perox gel Apply 1 application topically 2 (two) times daily. Patient not taking: Reported on 12/30/2017 12/27/17   [name], MD  ferrous sulfate 325 (65 FE) MG tablet Take 1 tablet (325 mg total) by mouth 2 (two) times daily with a meal. Patient not taking: Reported on 12/27/2017 03/29/17   Maryanna Shape, NP  hydrocortisone 2.5 % cream Apply topically 2 (two) times daily. Patient not taking: Reported on 12/30/2017 10/18/17   Alla Feeling, NP  loperamide (IMODIUM A-D) 2 MG tablet Take 1 tablet (2 mg total) by mouth 4 (four) times daily as needed for diarrhea or loose stools. Patient not taking: Reported on 12/30/2017 03/02/17   [name], MD  magnesium oxide (MAG-OX) 400 (241.3 Mg) MG tablet Take 1 tablet (400 mg total) by mouth 2 (two) times daily. Patient not taking:  Reported on 12/30/2017 12/13/17   [name], MD  magnesium oxide (MAG-OX) 400 (241.3 Mg) MG tablet Take 1 tablet (400 mg total) by mouth daily. Patient not taking: Reported on 12/30/2017 12/27/17   [name], MD  mirtazapine (REMERON) 15 MG tablet Take 1 tablet (15 mg total) by mouth at bedtime. Patient not taking: Reported on 12/27/2017 06/22/17   Gardenia Phlegm, NP  ondansetron (ZOFRAN) 8 MG tablet Take 1 tablet (8 mg total) by mouth 2 (two) times daily as needed for refractory nausea / vomiting. Start on day 3 after chemotherapy. Patient not taking: Reported on 12/30/2017 05/25/17   [name], MD  potassium chloride SA (K-DUR,KLOR-CON) 20 MEQ tablet Take 1 tablet (20 mEq total) by mouth 2 (two) times daily. Patient not taking: Reported on 12/30/2017 12/27/17   [name], MD  prochlorperazine (COMPAZINE) 10 MG tablet Take 1 tablet (10 mg total) by mouth every 6 (six) hours as needed (Nausea or vomiting). Patient not taking: Reported on 12/30/2017 05/25/17   [name], MD     Vital Signs: BP 112/69 (BP Location: Right Arm)   Pulse 67   Temp 98.3 F (36.8 C) (Oral)   Resp 16   Ht 5\' 9"  (1.753 m)   Wt 131 lb (59.4 kg)   SpO2 98%   BMI 19.35 kg/m   Physical Exam awake, alert; chest - CTA bilat; heart- RRR; abd- soft,+BS, RLQ colostomy intact, 1-2+ LLE edema; RLE without sig edema  Imaging: Mr Brain  W Wo Contrast  Result Date: 01/05/2018 CLINICAL DATA:  Metastatic colon cancer staging EXAM: MRI HEAD WITHOUT AND WITH CONTRAST TECHNIQUE: Multiplanar, multiecho pulse sequences of the brain and surrounding structures were obtained without and with intravenous contrast. CONTRAST:  10mL MULTIHANCE GADOBENATE DIMEGLUMINE 529 MG/ML IV SOLN COMPARISON:  CT head 11/30/2014 FINDINGS: Brain: Ventricle size and cerebral volume normal. Negative for infarct, hemorrhage, or mass. No edema. Normal enhancement following contrast infusion. Vascular: Normal arterial flow voids Skull and upper cervical spine:  Negative Sinuses/Orbits: Negative Other: None IMPRESSION: Negative MRI head with contrast.  Negative for metastatic disease. Electronically Signed   By: Franchot Gallo M.D.   On: 01/05/2018 11:40    Labs:  CBC: Recent Labs    01/02/18 0537 01/03/18 0158 01/04/18 0615 01/05/18 0601  WBC 4.8 5.0 5.4 5.5  HGB 8.4* 8.1* 8.3* 8.4*  HCT 26.3* 25.6* 26.5* 26.3*  PLT 139* 145* 151 180    COAGS: Recent Labs    01/29/17 1256 07/11/17 0340  08/12/17 0527 08/19/17 1047  01/02/18 1815 01/03/18 0158 01/03/18 0829 01/04/18 0615  INR 1.07 1.27  --  1.02 1.77  --   --   --   --   --   APTT 31  --    < >  --   --    < > 44* 72* 82* 91*   < > = values in this interval not displayed.    BMP: Recent Labs    08/21/17 0319  12/27/17 0820 12/30/17 1907 01/01/18 0528 01/02/18 0537  NA 135   < > 134* 134* 135 132*  K 3.6   < > 3.2* 3.4* 4.0 3.5  CL 102  --   --  100* 101 99*  CO2 25   < > 23 26 28 26   GLUCOSE 110*   < > 135 109* 106* 101*  BUN 16   < > 11.3 9 10 8   CALCIUM 9.2   < > 8.3* 8.2* 8.2* 8.1*  CREATININE 0.80   < > 0.9 0.80 0.78 0.69  GFRNONAA >60  --   --  >60 >60 >60  GFRAA >60  --   --  >60 >60 >60   < > = values in this interval not displayed.    LIVER FUNCTION TESTS: Recent Labs    11/29/17 0950 12/13/17 0835 12/27/17 0820 12/30/17 1907  BILITOT 0.34 0.31 0.52 0.7  AST 21 29 16 22   ALT 9 10 6  8*  ALKPHOS 94 95 69 66  PROT 7.9 8.2 7.8 8.4*  ALBUMIN 3.6 3.7 3.0* 3.2*    Assessment and Plan: Patient with history of stage IV colon cancer; now with acute left lower extremity DVT with extension into IVC; seen by our service on 01/01/18 in consultation and deemed appropriate candidate for left lower extremity/inferior vena cava venography with angiovac thrombectomy/full dose IV heparinization.  MRI brain negative for metastatic disease.  Patient awaiting transfer to [facility].  Details/risks of above procedure, including but not limited to, internal bleeding,  infection, contrast nephropathy, inability to eradicate clot, were discussed with patient's daughter with her understanding and consent.   Electronically Signed: D. Rowe Robert, PA-C 01/05/2018, 3:23 PM   I spent a total of 20 minutes at the the patient's bedside AND on the patient's hospital floor or unit, greater than 50% of which was counseling/coordinating care for left lower extremity venography with angiovac thrombectomy    Patient ID: Jeff [name], male   [name]: September 14, 1961,  57 y.o.   MRN: [id number]

## 2018-01-05 NOTE — Progress Notes (Signed)
ANTICOAGULATION CONSULT NOTE  Pharmacy Consult for heparin Indication: DVT  No Known Allergies  Patient Measurements: Height: 5\' 9"  (175.3 cm) Weight: 131 lb (59.4 kg) IBW/kg (Calculated) : 70.7 Heparin Dosing Weight: 59.4 kg  Vital Signs: Temp: 98.8 F (37.1 C) (01/09 0619) Temp Source: Oral (01/09 0619) BP: 100/58 (01/09 0619) Pulse Rate: 98 (01/09 0619)  Labs: Recent Labs    01/03/18 0158 01/03/18 0829 01/04/18 0615 01/05/18 0601  HGB 8.1*  --  8.3* 8.4*  HCT 25.6*  --  26.5* 26.3*  PLT 145*  --  151 180  APTT 72* 82* 91*  --   HEPARINUNFRC 0.43  --  0.53 0.49    Estimated Creatinine Clearance: 84.6 mL/min (by C-G formula based on SCr of 0.69 mg/dL).   Medical History: Past Medical History:  Diagnosis Date  . Acute deep vein thrombosis (DVT) of right lower extremity ([place]) 02/18/2017  . Malignant neoplasm of abdomen ([place])    [name] 01/11/2017  . Microcytic anemia    /notes 01/11/2017    Assessment: 57 yo M with new DVT.  Pharmacy consulted to dose low dose heparin with no bolus after pt experienced a nosebleed with xarelto.  LLE acute DVT 1/3, CT scan showed extensive thrombosis in the IVC, common iliac, left iliac vein and femoral vein  Due to his prior history of GI bleeding from his left colon mass when he was on maintenance Xarelto (did not do loading dose), he is at high risk for recurrent bleeding from anticoagulation.   He refused eliquis 1/5 AM dose with nosebleed 1/5.   Last dose 1/4 at 2149. MD request NO BOLUS and low goal.   Today, 01/05/2018:  Heparin level =  0.49   CBC: hgb low but stable since admission; Pltc improved to WNL  No bleeding reported  CrCl 85 ml/min   Goal of Therapy:  Heparin level 0.3 - 0.5 - low goal per MD request - APTT goal 66 - 84 seconds for low goal per MD request Monitor platelets by anticoagulation protocol: Yes   Plan:   Continue heparin at 1200 units/hr   Heparin to be held from 10:30 - 11:30 for MRI,  same rate to be resumed  Daily CBC, daily heparin level   Monitor for signs of bleeding or thrombosis  Patient agreed to angiovac procedure at [facility] on Thursday (1/10)   Patient to receive brain MRI to rule out brain mets   Await conversion to anticoagulation to be used at discharge ([facility])  Rockwell Alexandria, PharmD Candidate   01/05/2018 9:14 AM

## 2018-01-05 NOTE — Progress Notes (Signed)
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  Patient was placed on anticoagulation and interventional radiology was consulted.  After discussing with the patient and the patient's daughter, plans are for thrombectomy at [facility], and will transfer patient today.  Assessment & Plan: Principal Problem:   Evaluation by psychiatric service required Active Problems:   DVT (deep venous thrombosis) (HCC)   Malignant neoplasm of colon [facility])   Palliative care by specialist   Advance care planning   Left lowextremity DVT, acute -Continue IV heparin, this is a setting of hypercoagulable state given underlying malignancy -Discussed with interventional radiology and patient today, will transfer patient from [facility] hospital to [facility] for planned thrombectomy on 01/06/2018 -CT scan showed extensive thrombosis in the IVC, common iliac, left iliac vein and femoral vein -prior history of GI bleeding from his left colon mass when he was onmaintenance Xarelto,he is at high risk for recurrent bleeding from anticoagulation,  -After a lengthy discussion, both patient and his daughter decided to proceed with the thrombectomywith angiovac by IR during this hospital stay. -MRI brain without metastatic disease -Pt agrees to continueanticoagulation after procedure, he understands we recommend Lovenox injection, he is not opposed to it.  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 -on palliative chemotherapy 5-FU and Panitumumab -last chemo session 11/27/2017, follows with Dr  Burr Medico  Acute on chronic chronic anemia of chronic disease -anemia in the setting of underlying colon cancer & secondary to chemotherapy, high risk for bleeding and further blood loss and on anticoagulation watch closely and transfuse as clinically indicated -No evidence of bleeding today  Moderate Protein and calorie malnutrition with Anorexia -supplements advised dietitian consult requested, c/n Remeron for appetite stimulation   Social/Ethics -patient is a full code, palliative care consult requested, trying to reach patient's daughter however patient has not given permission to talk to his daughter.  Psychiatric consult to help determine capacity to make decisions  noted from Dr Mariea Clonts on 01/04/18.  Patient has poor decision-making capacity please contact patient's daughter Ms Jeff [name] if consent for procedures if needed, please see psychiatric consult note dated 01/04/2018   DVT prophylaxis: heparin infusion Code Status: Full code Family Communication: no family at bedside Disposition Plan: transfer to [facility]  Consultants:   IR  Oncology  Palliative care  Psychiatry   Procedures:   None   Antimicrobials:  None    Subjective: -feeling good, eating breakfast. Complains of left leg being swollen and painful  Objective: Vitals:   01/04/18 0706 01/04/18 1328 01/04/18 2249 01/05/18 0619  BP: 100/68 107/68 99/62 (!) 100/58  Pulse: 95 (!) 101 93 98  Resp: 17 18 15 16   Temp: 98.9 F (37.2 C) 98.5 F (36.9 C) 98.6 F (37 C) 98.8 F (37.1 C)  TempSrc: Oral Oral Oral Oral  SpO2: 100% 100% 100% 99%  Weight:      Height:        Intake/Output Summary (Last 24 hours) at 01/05/2018 1215 Last data filed at 01/05/2018 0600 Gross per 24 hour  Intake 2292 ml  Output -  Net 2292 ml   Filed  Weights   12/31/17 1215  Weight: 59.4 kg (131 lb)    Examination:  Constitutional: NAD Eyes: PERRL, lids and conjunctivae normal ENMT: Mucous membranes are moist.  Neck: normal,  supple Respiratory: clear to auscultation bilaterally, no wheezing, no crackles. Normal respiratory effort.  Cardiovascular: Regular rate and rhythm, no murmurs / rubs / gallops. 1+ LLE edema. 2+ pedal pulses.  Abdomen: no tenderness. Bowel sounds positive.  Musculoskeletal: no clubbing / cyanosis. LLE with diffuse tight swelling calf and thigh area   Skin: no rashes Neurologic: CN 2-12 grossly intact. Strength 5/5 in all 4.    Data Reviewed: I have independently reviewed following labs and imaging studies  CBC: Recent Labs  Lab 12/30/17 1907 01/01/18 0528 01/02/18 0537 01/03/18 0158 01/04/18 0615 01/05/18 0601  WBC 6.4 4.9 4.8 5.0 5.4 5.5  NEUTROABS 4.6  --   --   --   --   --   HGB 9.4* 8.5* 8.4* 8.1* 8.3* 8.4*  HCT 29.3* 26.5* 26.3* 25.6* 26.5* 26.3*  MCV 83.0 83.3 83.8 83.9 84.4 84.3  PLT 156 116* 139* 145* 151 914   Basic Metabolic Panel: Recent Labs  Lab 12/30/17 1907 01/01/18 0528 01/02/18 0537  NA 134* 135 132*  K 3.4* 4.0 3.5  CL 100* 101 99*  CO2 26 28 26   GLUCOSE 109* 106* 101*  BUN 9 10 8   CREATININE 0.80 0.78 0.69  CALCIUM 8.2* 8.2* 8.1*  MG  --  1.1* 0.9*   GFR: Estimated Creatinine Clearance: 84.6 mL/min (by C-G formula based on SCr of 0.69 mg/dL). Liver Function Tests: Recent Labs  Lab 12/30/17 1907  AST 22  ALT 8*  ALKPHOS 66  BILITOT 0.7  PROT 8.4*  ALBUMIN 3.2*   No results for input(s): LIPASE, AMYLASE in the last 168 hours. No results for input(s): AMMONIA in the last 168 hours. Coagulation Profile: No results for input(s): INR, PROTIME in the last 168 hours. Cardiac Enzymes: No results for input(s): CKTOTAL, CKMB, CKMBINDEX, TROPONINI in the last 168 hours. BNP (last 3 results) No results for input(s): PROBNP in the last 8760 hours. HbA1C: No results for input(s): HGBA1C in the last 72 hours. CBG: No results for input(s): GLUCAP in the last 168 hours. Lipid Profile: No results for input(s): CHOL, HDL, LDLCALC, TRIG, CHOLHDL,  LDLDIRECT in the last 72 hours. Thyroid Function Tests: No results for input(s): TSH, T4TOTAL, FREET4, T3FREE, THYROIDAB in the last 72 hours. Anemia Panel: No results for input(s): VITAMINB12, FOLATE, FERRITIN, TIBC, IRON, RETICCTPCT in the last 72 hours. Urine analysis:    Component Value Date/Time   COLORURINE YELLOW 12/30/2017 2050   APPEARANCEUR CLEAR 12/30/2017 2050   LABSPEC >1.046 (H) 12/30/2017 2050   [place] 5.0 12/30/2017 2050   GLUCOSEU NEGATIVE 12/30/2017 2050   [place] NEGATIVE 12/30/2017 2050   [place] NEGATIVE 12/30/2017 2050   [place] 12/30/2017 2050   PROTEINUR NEGATIVE 12/30/2017 2050   NITRITE NEGATIVE 12/30/2017 2050   LEUKOCYTESUR NEGATIVE 12/30/2017 2050   Sepsis Labs: Invalid input(s): PROCALCITONIN, LACTICIDVEN  Recent Results (from the past 240 hour(s))  Urine culture     Status: None   Collection Time: 12/30/17  8:50 PM  Result Value Ref Range Status   Specimen Description URINE, CLEAN CATCH  Final   Special Requests NONE  Final   Culture   Final    NO GROWTH Performed at [facility], [address], [place], [place] [address]    Report Status 01/01/2018 FINAL  Final  Radiology Studies: Mr Jeri Cos Wo Contrast  Result Date: 01/05/2018 CLINICAL DATA:  Metastatic colon cancer staging EXAM: MRI HEAD WITHOUT AND WITH CONTRAST TECHNIQUE: Multiplanar, multiecho pulse sequences of the brain and surrounding structures were obtained without and with intravenous contrast. CONTRAST:  61mL MULTIHANCE GADOBENATE DIMEGLUMINE 529 MG/ML IV SOLN COMPARISON:  CT head 11/30/2014 FINDINGS: Brain: Ventricle size and cerebral volume normal. Negative for infarct, hemorrhage, or mass. No edema. Normal enhancement following contrast infusion. Vascular: Normal arterial flow voids Skull and upper cervical spine: Negative Sinuses/Orbits: Negative Other: None IMPRESSION: Negative MRI head with contrast.  Negative for metastatic disease. Electronically  Signed   By: Franchot Gallo M.D.   On: 01/05/2018 11:40     Scheduled Meds: . docusate sodium  100 mg Oral BID  . feeding supplement (ENSURE ENLIVE)  120 mL Oral TID  . gabapentin  100 mg Oral TID  . mirtazapine  15 mg Oral QHS  . multivitamin with minerals  1 tablet Oral Daily  . oxyCODONE  10 mg Oral Q12H   Continuous Infusions: . heparin 1,200 Units/hr (01/05/18 1126)    Marzetta Board, MD, PhD [facility] Pager [phone]  If 7PM-7AM, please contact night-coverage [email or web address] Password [email or web address] 01/05/2018, 12:15 PM

## 2018-01-05 NOTE — Progress Notes (Signed)
PMT no charge note  Patient currently off the floor, possibly for his MRI brain. Discussed with bedside staff, call placed and discussed with his primary caregiver/primary decision maker daughter Jeff [name] at [phone].   She is appreciative of the information she has received from Dr Burr Medico and other hospital staff. She is informed about the patient undergoing MRI brain and also possibly needing transfer to [facility] on 1-10--19 for thrombectomy.  Discussed briefly with daughter about what his post op hospital course might look like, she asked if he would come home, that he is by himself and likely would need SNF rehab attempt on discharge. PMT to continue to follow hospital course and disease trajectory.   Loistine Chance MD [facility] health palliative medicine team [phone]

## 2018-01-06 ENCOUNTER — Other Ambulatory Visit (HOSPITAL_COMMUNITY): Payer: Medicaid Other

## 2018-01-06 ENCOUNTER — Observation Stay (HOSPITAL_COMMUNITY): Payer: Medicaid Other

## 2018-01-06 ENCOUNTER — Inpatient Hospital Stay (HOSPITAL_COMMUNITY): Payer: Medicaid Other | Admitting: Anesthesiology

## 2018-01-06 ENCOUNTER — Ambulatory Visit (HOSPITAL_COMMUNITY)
Admit: 2018-01-06 | Discharge: 2018-01-06 | Disposition: A | Discharge status: Home / Self Care | Payer: Medicaid Other | Attending: Interventional Radiology | Admitting: Interventional Radiology

## 2018-01-06 ENCOUNTER — Encounter (HOSPITAL_COMMUNITY)
Admission: EM | Disposition: A | Discharge status: Home Health | Payer: Self-pay | Source: Home / Self Care | Attending: Family Medicine

## 2018-01-06 ENCOUNTER — Encounter (HOSPITAL_COMMUNITY): Payer: Self-pay | Admitting: Certified Registered"

## 2018-01-06 DIAGNOSIS — I824Y2 Acute embolism and thrombosis of unspecified deep veins of left proximal lower extremity: Secondary | ICD-10-CM | POA: Diagnosis not present

## 2018-01-06 DIAGNOSIS — I82403 Acute embolism and thrombosis of unspecified deep veins of lower extremity, bilateral: Secondary | ICD-10-CM | POA: Diagnosis present

## 2018-01-06 DIAGNOSIS — R319 Hematuria, unspecified: Secondary | ICD-10-CM | POA: Diagnosis not present

## 2018-01-06 DIAGNOSIS — K922 Gastrointestinal hemorrhage, unspecified: Secondary | ICD-10-CM

## 2018-01-06 DIAGNOSIS — J9601 Acute respiratory failure with hypoxia: Secondary | ICD-10-CM | POA: Diagnosis not present

## 2018-01-06 HISTORY — PX: IR IVC FILTER RETRIEVAL / S&I /IMG GUID/MOD SED: IMG5308

## 2018-01-06 HISTORY — PX: IR VENO/EXT/BI: IMG677

## 2018-01-06 HISTORY — PX: IR PTA VENOUS EXCEPT DIALYSIS CIRCUIT: IMG6126

## 2018-01-06 HISTORY — PX: IR PTA VENOUS ADDL EXCEPT DIALYSIS CIRCUIT: IMG6127

## 2018-01-06 HISTORY — PX: IR US GUIDE VASC ACCESS RIGHT: IMG2390

## 2018-01-06 HISTORY — PX: IR TRANSCATH PLC STENT  INITIAL VEIN  INC ANGIOPLASTY: IMG5445

## 2018-01-06 HISTORY — PX: IR THROMBECT VENO MECH MOD SED: IMG2300

## 2018-01-06 HISTORY — PX: IR US GUIDE VASC ACCESS LEFT: IMG2389

## 2018-01-06 HISTORY — PX: RADIOLOGY WITH ANESTHESIA: SHX6223

## 2018-01-06 HISTORY — PX: IR VENOCAVAGRAM IVC: IMG678

## 2018-01-06 LAB — CBC
HEMATOCRIT: 25.8 % — AB (ref 39.0–52.0)
HEMOGLOBIN: 7.8 g/dL — AB (ref 13.0–17.0)
MCH: 25.6 pg — ABNORMAL LOW (ref 26.0–34.0)
MCHC: 30.2 g/dL (ref 30.0–36.0)
MCV: 84.6 fL (ref 78.0–100.0)
Platelets: 160 10*3/uL (ref 150–400)
RBC: 3.05 MIL/uL — ABNORMAL LOW (ref 4.22–5.81)
RDW: 17.5 % — ABNORMAL HIGH (ref 11.5–15.5)
WBC: 5.4 10*3/uL (ref 4.0–10.5)

## 2018-01-06 LAB — BLOOD GAS, ARTERIAL
Acid-Base Excess: 1.8 mmol/L (ref 0.0–2.0)
BICARBONATE: 24.7 mmol/L (ref 20.0–28.0)
Drawn by: 252031
FIO2: 60
LHR: 16 {breaths}/min
O2 Saturation: 99.6 %
PEEP: 5 cmH2O
Patient temperature: 98.6
VT: 560 mL
pCO2 arterial: 30.8 mmHg — ABNORMAL LOW (ref 32.0–48.0)
pH, Arterial: 7.514 — ABNORMAL HIGH (ref 7.350–7.450)
pO2, Arterial: 326 mmHg — ABNORMAL HIGH (ref 83.0–108.0)

## 2018-01-06 LAB — POCT ACTIVATED CLOTTING TIME
ACTIVATED CLOTTING TIME: 208 s
ACTIVATED CLOTTING TIME: 296 s
ACTIVATED CLOTTING TIME: 312 s
ACTIVATED CLOTTING TIME: 346 s
ACTIVATED CLOTTING TIME: 373 s
ACTIVATED CLOTTING TIME: 318 s
ACTIVATED CLOTTING TIME: 334 s
ACTIVATED CLOTTING TIME: 335 s
Activated Clotting Time: 142 seconds
Activated Clotting Time: 373 seconds

## 2018-01-06 LAB — POCT I-STAT 4, (NA,K, GLUC, HGB,HCT)
Glucose, Bld: 111 mg/dL — ABNORMAL HIGH (ref 65–99)
HEMATOCRIT: 31 % — AB (ref 39.0–52.0)
Hemoglobin: 10.5 g/dL — ABNORMAL LOW (ref 13.0–17.0)
Potassium: 3.8 mmol/L (ref 3.5–5.1)
Sodium: 138 mmol/L (ref 135–145)

## 2018-01-06 LAB — GLUCOSE, CAPILLARY: GLUCOSE-CAPILLARY: 138 mg/dL — AB (ref 65–99)

## 2018-01-06 LAB — HEPARIN LEVEL (UNFRACTIONATED)
Heparin Unfractionated: 0.33 IU/mL (ref 0.30–0.70)
Heparin Unfractionated: 0.72 IU/mL — ABNORMAL HIGH (ref 0.30–0.70)

## 2018-01-06 LAB — PREPARE RBC (CROSSMATCH)

## 2018-01-06 SURGERY — IR WITH ANESTHESIA
Anesthesia: General | Laterality: Left

## 2018-01-06 MED ORDER — PROPOFOL 1000 MG/100ML IV EMUL
0.0000 ug/kg/min | INTRAVENOUS | Status: DC
  Administered 2018-01-06: 50 ug/kg/min via INTRAVENOUS
  Administered 2018-01-07: 40 ug/kg/min via INTRAVENOUS
  Filled 2018-01-06: qty 100

## 2018-01-06 MED ORDER — SODIUM CHLORIDE 0.9 % IV SOLN
0.0000 ug/h | INTRAVENOUS | Status: DC
  Administered 2018-01-06: 25 ug/h via INTRAVENOUS
  Filled 2018-01-06: qty 50

## 2018-01-06 MED ORDER — IOPAMIDOL (ISOVUE-300) INJECTION 61%
INTRAVENOUS | Status: AC
  Administered 2018-01-06: 70 mL
  Filled 2018-01-06: qty 150

## 2018-01-06 MED ORDER — DEXAMETHASONE SODIUM PHOSPHATE 10 MG/ML IJ SOLN
INTRAMUSCULAR | Status: DC | PRN
  Administered 2018-01-06: 10 mg via INTRAVENOUS

## 2018-01-06 MED ORDER — MIDAZOLAM HCL 2 MG/2ML IJ SOLN: 2.0000 mg | INTRAMUSCULAR | Status: DC | PRN

## 2018-01-06 MED ORDER — PROPOFOL 10 MG/ML IV BOLUS
INTRAVENOUS | Status: DC | PRN
  Administered 2018-01-06: 200 mg via INTRAVENOUS

## 2018-01-06 MED ORDER — MIDAZOLAM HCL 5 MG/5ML IJ SOLN
INTRAMUSCULAR | Status: DC | PRN
  Administered 2018-01-06 (×3): 1 mg via INTRAVENOUS

## 2018-01-06 MED ORDER — GABAPENTIN 250 MG/5ML PO SOLN
100.0000 mg | Freq: Three times a day (TID) | ORAL | Status: DC
  Filled 2018-01-06 (×4): qty 2

## 2018-01-06 MED ORDER — CHLORHEXIDINE GLUCONATE CLOTH 2 % EX PADS
6.0000 | MEDICATED_PAD | Freq: Every day | CUTANEOUS | Status: AC
  Administered 2018-01-06 – 2018-01-10 (×4): 6 via TOPICAL

## 2018-01-06 MED ORDER — ALTEPLASE 2 MG IJ SOLR
INTRAMUSCULAR | Status: AC
  Filled 2018-01-06: qty 4

## 2018-01-06 MED ORDER — HEPARIN SODIUM (PORCINE) 1000 UNIT/ML IJ SOLN
INTRAMUSCULAR | Status: DC | PRN
  Administered 2018-01-06: 5000 [IU] via INTRAVENOUS
  Administered 2018-01-06 (×2): 10000 [IU] via INTRAVENOUS

## 2018-01-06 MED ORDER — PANTOPRAZOLE SODIUM 40 MG PO PACK: 40.0000 mg | PACK | ORAL | Status: DC

## 2018-01-06 MED ORDER — CHLORHEXIDINE GLUCONATE 0.12% ORAL RINSE (MEDLINE KIT)
15.0000 mL | Freq: Two times a day (BID) | OROMUCOSAL | Status: DC
  Administered 2018-01-06: 15 mL via OROMUCOSAL

## 2018-01-06 MED ORDER — LIDOCAINE HCL (PF) 1 % IJ SOLN
INTRAMUSCULAR | Status: AC | PRN
  Administered 2018-01-06: 5 mL

## 2018-01-06 MED ORDER — FENTANYL CITRATE (PF) 100 MCG/2ML IJ SOLN
INTRAMUSCULAR | Status: DC | PRN
  Administered 2018-01-06: 50 ug via INTRAVENOUS
  Administered 2018-01-06: 100 ug via INTRAVENOUS
  Administered 2018-01-06 (×4): 50 ug via INTRAVENOUS

## 2018-01-06 MED ORDER — PANTOPRAZOLE SODIUM 40 MG IV SOLR
40.0000 mg | Freq: Two times a day (BID) | INTRAVENOUS | Status: DC
  Administered 2018-01-06 – 2018-01-07 (×3): 40 mg via INTRAVENOUS
  Filled 2018-01-06 (×3): qty 40

## 2018-01-06 MED ORDER — ALTEPLASE 2 MG IJ SOLR
INTRAMUSCULAR | Status: AC
  Filled 2018-01-06: qty 2

## 2018-01-06 MED ORDER — ALTEPLASE 100 MG IV SOLR
4.0000 mg | INTRAVENOUS | Status: DC
  Filled 2018-01-06: qty 100

## 2018-01-06 MED ORDER — IOPAMIDOL (ISOVUE-300) INJECTION 61%
INTRAVENOUS | Status: AC
  Administered 2018-01-06: 140 mL
  Filled 2018-01-06: qty 300

## 2018-01-06 MED ORDER — ORAL CARE MOUTH RINSE
15.0000 mL | Freq: Four times a day (QID) | OROMUCOSAL | Status: DC
  Administered 2018-01-07: 15 mL via OROMUCOSAL

## 2018-01-06 MED ORDER — ROCURONIUM BROMIDE 10 MG/ML (PF) SYRINGE
PREFILLED_SYRINGE | INTRAVENOUS | Status: DC | PRN
  Administered 2018-01-06 (×3): 10 mg via INTRAVENOUS
  Administered 2018-01-06: 20 mg via INTRAVENOUS
  Administered 2018-01-06: 50 mg via INTRAVENOUS

## 2018-01-06 MED ORDER — ALTEPLASE 100 MG IV SOLR
INTRAVENOUS | Status: AC | PRN
  Administered 2018-01-06 (×2): 4 mg
  Administered 2018-01-06: 6 mg

## 2018-01-06 MED ORDER — SODIUM CHLORIDE 0.9 % IV SOLN: Freq: Once | INTRAVENOUS | Status: DC

## 2018-01-06 MED ORDER — PANTOPRAZOLE SODIUM 40 MG IV SOLR: 40.0000 mg | INTRAVENOUS | Status: DC

## 2018-01-06 MED ORDER — ACETAMINOPHEN 160 MG/5ML PO SOLN
650.0000 mg | Freq: Four times a day (QID) | ORAL | Status: DC | PRN
  Administered 2018-01-08: 650 mg
  Filled 2018-01-06: qty 20.3

## 2018-01-06 MED ORDER — LACTATED RINGERS IV SOLN
INTRAVENOUS | Status: DC | PRN
  Administered 2018-01-06 (×3): via INTRAVENOUS

## 2018-01-06 MED ORDER — PROPOFOL 1000 MG/100ML IV EMUL
5.0000 ug/kg/min | INTRAVENOUS | Status: DC
  Administered 2018-01-06: 60 ug/kg/min via INTRAVENOUS

## 2018-01-06 MED ORDER — MUPIROCIN 2 % EX OINT
1.0000 "application " | TOPICAL_OINTMENT | Freq: Two times a day (BID) | CUTANEOUS | Status: AC
  Administered 2018-01-06 – 2018-01-10 (×9): 1 via NASAL
  Filled 2018-01-06 (×3): qty 22

## 2018-01-06 MED ORDER — LIDOCAINE 2% (20 MG/ML) 5 ML SYRINGE
INTRAMUSCULAR | Status: DC | PRN
  Administered 2018-01-06: 60 mg via INTRAVENOUS

## 2018-01-06 MED ORDER — MIRTAZAPINE 15 MG PO TBDP
15.0000 mg | ORAL_TABLET | Freq: Every day | ORAL | Status: DC
  Administered 2018-01-07 – 2018-01-10 (×4): 15 mg
  Filled 2018-01-06 (×6): qty 1

## 2018-01-06 MED ORDER — LIDOCAINE HCL 1 % IJ SOLN
INTRAMUSCULAR | Status: AC
  Filled 2018-01-06: qty 20

## 2018-01-06 MED ORDER — DOCUSATE SODIUM 50 MG/5ML PO LIQD
100.0000 mg | Freq: Two times a day (BID) | ORAL | Status: DC
  Filled 2018-01-06: qty 10

## 2018-01-06 MED ORDER — FENTANYL CITRATE (PF) 100 MCG/2ML IJ SOLN
100.0000 ug | INTRAMUSCULAR | Status: DC | PRN
  Administered 2018-01-06: 100 ug via INTRAVENOUS
  Filled 2018-01-06: qty 2

## 2018-01-06 SURGICAL SUPPLY — 1 items: PUMP SARN DELFIN (MISCELLANEOUS) ×2 IMPLANT

## 2018-01-06 NOTE — Transfer of Care (Signed)
Immediate Anesthesia Transfer of Care Note  Patient: Jeff [name]  Procedure(s) Performed: Left lower extremity venogram with angiovac (Left )  Patient Location: PACU  Anesthesia Type:General  Level of Consciousness: sedated and Patient remains intubated per anesthesia plan  Airway & Oxygen Therapy: Patient placed on Ventilator (see vital sign flow sheet for setting)  Post-op Assessment: Report given to RN and Post -op Vital signs reviewed and stable  Post vital signs: Reviewed and stable  Last Vitals:  Vitals:   01/06/18 0427 01/06/18 0750  BP: 107/64 103/70  Pulse: 92 95  Resp: 14 20  Temp: 36.8 C 37 C  SpO2: 99% 100%    Last Pain:  Vitals:   01/06/18 1027  TempSrc:   PainSc: 4       Patients Stated Pain Goal: 2 (15/94/58 5929)  Complications: No apparent anesthesia complications

## 2018-01-06 NOTE — Sedation Documentation (Signed)
Pt transported to PACU to await bed assignment.

## 2018-01-06 NOTE — Sedation Documentation (Signed)
Dressing assessed in PACU with Chip, RN.

## 2018-01-06 NOTE — Sedation Documentation (Signed)
R IJ Drsg CDI, R Fem ven drsg CDI, L Pop drsg saturated with blood. Drsg removed, no active bleeding, no hematoma. Redressed with gauze/Tegaderm.

## 2018-01-06 NOTE — Anesthesia Procedure Notes (Signed)
Arterial Line Insertion Start/End1/09/2018 11:15 AM, 01/06/2018 11:25 AM Performed by: Josephine Igo, CRNA, CRNA  Patient location: Pre-op. Preanesthetic checklist: patient identified, IV checked, site marked, risks and benefits discussed, surgical consent, monitors and equipment checked, pre-op evaluation and timeout performed Lidocaine 1% used for infiltration Left, radial was placed Catheter size: 20 G Hand hygiene performed  Allen's test indicative of satisfactory collateral circulation Attempts: 1 Procedure performed without using ultrasound guided technique. Following insertion, Biopatch. Post procedure assessment: normal  Patient tolerated the procedure well with no immediate complications.

## 2018-01-06 NOTE — Sedation Documentation (Signed)
L leg drsg CDI, R goin drsg CDI, R IJ drsg CDI.

## 2018-01-06 NOTE — Sedation Documentation (Addendum)
26Fr. Sheath removed from R common femoral vein by Dr. Earleen Newport. Hemostasis by manual pressure. Level 0.

## 2018-01-06 NOTE — Sedation Documentation (Signed)
18 Fr and 26 Fr. R IJ sheaths removed by Dr. Earleen Newport. Hemostasis with manual pressure. Level 0.

## 2018-01-06 NOTE — Sedation Documentation (Signed)
ACT 202. Dr Earleen Newport aware.

## 2018-01-06 NOTE — Progress Notes (Signed)
ANTICOAGULATION CONSULT NOTE - Follow-Up Consult  Pharmacy Consult for heparin Indication: DVT  No Known Allergies  Patient Measurements: Height: 5\' 9"  (175.3 cm) Weight: 134 lb 3.2 oz (60.9 kg) IBW/kg (Calculated) : 70.7 Heparin Dosing Weight: 59.4 kg  Vital Signs: Temp: 98.6 F (37 C) (01/10 0750) Temp Source: Oral (01/10 0750) BP: 103/70 (01/10 0750) Pulse Rate: 95 (01/10 0750)  Labs: Recent Labs    01/04/18 0615 01/05/18 0601 01/06/18 0749  HGB 8.3* 8.4* 7.8*  HCT 26.5* 26.3* 25.8*  PLT 151 180 160  APTT 91*  --   --   HEPARINUNFRC 0.53 0.49 0.33    Estimated Creatinine Clearance: 86.7 mL/min (by C-G formula based on SCr of 0.69 mg/dL).   Medical History: Past Medical History:  Diagnosis Date  . Acute deep vein thrombosis (DVT) of right lower extremity ([place]) 02/18/2017  . Malignant neoplasm of abdomen ([place])    [name] 01/11/2017  . Microcytic anemia    /notes 01/11/2017    Assessment: 57 yo M with new DVT.  Pharmacy consulted to dose low dose heparin with no bolus after pt experienced a nosebleed with Eliquis.  LLE acute DVT diagnosed 1/3, CT scan showed extensive thrombosis in the IVC, common iliac, left iliac vein and femoral vein.  Due to his prior history of GI bleeding from his left colon mass when he was on maintenance Xarelto (did not do loading dose), he is at high risk for recurrent bleeding from anticoagulation.  Pt also reported a nosebleed while on Eliquis.  Last dose 1/4 at 2149.   Pt continues on heparin in the interim.  MD request NO BOLUS and low goal. Pt is therapeutic on 1200 units/hr.  Noted plans for thrombectomy procedure today.   Goal of Therapy:  Heparin level 0.3 - 0.5 - low goal per MD request Monitor platelets by anticoagulation protocol: Yes   Plan:   Continue heparin at 1200 units/hr   Daily CBC, daily heparin level   Monitor for signs of bleeding or thrombosis  Follow-up after  angiovac procedure at [facility] on Thursday  (1/10)   Follow-up plans for long term anticoag  [employer], Pharm.D., BCPS Clinical Pharmacist Pager: [phone] Clinical phone for 01/06/2018 from 8:30-4:00 is [phone]. After 4pm, please call Main Rx [phone]) for assistance. 01/06/2018 11:05 AM

## 2018-01-06 NOTE — Sedation Documentation (Addendum)
Report received from [place].  Anesthesia continuing with all monitoring and medications

## 2018-01-06 NOTE — Sedation Documentation (Signed)
R fem vein puncture site dressed with gauze/tegaderm. Groin level 0, drsg CDI.

## 2018-01-06 NOTE — Anesthesia Postprocedure Evaluation (Signed)
Anesthesia Post Note  Patient: Jeff [name]  Procedure(s) Performed: Left lower extremity venogram with angiovac (Left )     Patient location during evaluation: SICU Anesthesia Type: General Level of consciousness: sedated Pain management: pain level controlled Vital Signs Assessment: post-procedure vital signs reviewed and stable Respiratory status: patient remains intubated per anesthesia plan Cardiovascular status: stable Postop Assessment: no apparent nausea or vomiting Anesthetic complications: no    Last Vitals:  Vitals:   01/06/18 2100 01/06/18 2115  BP: (!) 176/111 (!) 179/110  Pulse:  (!) 57  Resp: 16 16  Temp:  (!) 36.4 C  SpO2:  100%    Last Pain:  Vitals:   01/06/18 1027  TempSrc:   PainSc: [place] Selyna Klahn

## 2018-01-06 NOTE — Anesthesia Preprocedure Evaluation (Addendum)
Anesthesia Evaluation  Patient identified by MRN, date of birth, ID band Patient awake    Reviewed: Allergy & Precautions, H&P , NPO status , Patient's Chart, lab work & pertinent test results  Airway Mallampati: II  TM Distance: >3 FB Neck ROM: Full    Dental no notable dental hx. (+) Poor Dentition, Dental Advisory Given   Pulmonary neg pulmonary ROS,    Pulmonary exam normal breath sounds clear to auscultation       Cardiovascular + Peripheral Vascular Disease  negative cardio ROS   Rhythm:Regular Rate:Normal     Neuro/Psych Depression negative neurological ROS  negative psych ROS   GI/Hepatic negative GI ROS, Neg liver ROS, Stage IV colon CA   Endo/Other  negative endocrine ROS  Renal/GU negative Renal ROS  negative genitourinary   Musculoskeletal   Abdominal   Peds  Hematology negative hematology ROS (+) anemia ,   Anesthesia Other Findings   Reproductive/Obstetrics negative OB ROS                            Anesthesia Physical Anesthesia Plan  ASA: III  Anesthesia Plan: General   Post-op Pain Management:    Induction: Intravenous  PONV Risk Score and Plan: 3 and Ondansetron, Dexamethasone and Midazolam  Airway Management Planned: Oral ETT  Additional Equipment: Arterial line  Intra-op Plan:   Post-operative Plan: Extubation in OR  Informed Consent: I have reviewed the patients History and Physical, chart, labs and discussed the procedure including the risks, benefits and alternatives for the proposed anesthesia with the patient or authorized representative who has indicated his/her understanding and acceptance.   Dental advisory given  Plan Discussed with: CRNA  Anesthesia Plan Comments:         Anesthesia Quick Evaluation

## 2018-01-06 NOTE — H&P (Signed)
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 Admitted 12/31/17 for worsening LE DVT . CT scan had shown extensive thrombosis in the IVC, common iliac, left iliac vein, and femoral vein. MRI brain does not show any brain mets. He underwent thrombectomy today. Plan is to continue anticoagulation after the procedure. We were consulted for overnight vent management.    PAST MEDICAL HISTORY :  He  has a past medical history of Acute deep vein thrombosis (DVT) of right lower extremity ([place]) (02/18/2017), Malignant neoplasm of abdomen ([place]), and Microcytic anemia.  PAST SURGICAL HISTORY: He  has a past surgical history that includes Leg Surgery (1990s); Colonoscopy (Left, 01/12/2017); ir generic historical (01/29/2017); ir generic historical (01/29/2017); IR Catheter Tube Change (07/19/2017); Flexible sigmoidoscopy (N/A, 08/03/2017); laparotomy (N/A, 08/04/2017); IR Sinus/Fist Tube Chk-Non GI (08/09/2017); IR Sinus/Fist Tube Chk-Non GI (08/12/2017); and IR IVC FILTER PLMT / S&I /IMG GUID/MOD SED (08/20/2017).  No Known Allergies  Current Facility-Administered Medications on File Prior to Encounter  Medication  . 0.9 %  sodium chloride infusion  . sodium chloride flush (NS) 0.9 % injection 10 mL  . sodium chloride flush (NS) 0.9 % injection 10 mL   Current Outpatient Medications on File Prior to Encounter  Medication Sig  . diphenoxylate-atropine (LOMOTIL) 2.5-0.025 MG tablet Take 1-2 tablets by mouth 4 (four) times daily as needed for diarrhea or loose  stools.  . docusate sodium (COLACE) 100 MG capsule Take 100 mg by mouth 2 (two) times daily.  Marland Kitchen gabapentin (NEURONTIN) 100 MG capsule Take 1 capsule (100 mg total) 3 (three) times daily by mouth.  . Nutritional Supplements (NUTRITIONAL SUPPLEMENT PO) House Supplement - give 120 ml by mouth three times daily between meals for supplement  . Oxycodone HCl 10 MG TABS Take 1 tablet (10 mg total) by mouth every 6 (six) hours as needed.  . Clindamycin Phos-Benzoyl Perox gel Apply 1 application topically 2 (two) times daily. (Patient not taking: Reported on 12/30/2017)  . ferrous sulfate 325 (65 FE) MG tablet Take 1 tablet (325 mg total) by mouth 2 (two) times daily with a meal. (Patient not taking: Reported on 12/27/2017)  . hydrocortisone 2.5 % cream Apply topically 2 (two) times daily. (Patient not taking: Reported on 12/30/2017)  . loperamide (IMODIUM A-D) 2 MG tablet Take 1 tablet (2 mg total) by mouth 4 (four) times daily as needed for diarrhea or loose stools. (Patient not taking: Reported on 12/30/2017)  . magnesium oxide (MAG-OX) 400 (241.3 Mg) MG tablet Take 1 tablet (400 mg total) by mouth 2 (two) times daily. (Patient not taking: Reported on 12/30/2017)  . magnesium oxide (MAG-OX) 400 (241.3 Mg) MG tablet Take 1 tablet (400 mg total) by mouth daily. (Patient not taking: Reported on 12/30/2017)  . mirtazapine (REMERON) 15 MG tablet Take 1 tablet (15 mg total) by mouth at bedtime. (Patient not taking: Reported on 12/27/2017)  . ondansetron (ZOFRAN) 8 MG tablet Take 1 tablet (8 mg total) by mouth 2 (two) times daily as  needed for refractory nausea / vomiting. Start on day 3 after chemotherapy. (Patient not taking: Reported on 12/30/2017)  . potassium chloride SA (K-DUR,KLOR-CON) 20 MEQ tablet Take 1 tablet (20 mEq total) by mouth 2 (two) times daily. (Patient not taking: Reported on 12/30/2017)  . prochlorperazine (COMPAZINE) 10 MG tablet Take 1 tablet (10 mg total) by mouth every 6 (six) hours as needed (Nausea or  vomiting). (Patient not taking: Reported on 12/30/2017)    FAMILY HISTORY:  His indicated that the status of his mother is unknown.   SOCIAL HISTORY: He  reports that  has never smoked. he has never used smokeless tobacco. He reports that he drinks about 3.6 oz of alcohol per week. He reports that he does not use drugs.  REVIEW OF SYSTEMS:   On vent ,     VITAL SIGNS: BP 103/70 (BP Location: Right Arm)   Pulse 95   Temp 98.6 F (37 C) (Oral)   Resp 20   Ht 5\' 9"  (1.753 m)   Wt 134 lb 3.2 oz (60.9 kg)   SpO2 100%   BMI 19.82 kg/m   HEMODYNAMICS:    VENTILATOR SETTINGS:  PEEP 5 O2 40 RR 16 TV 560  INTAKE / OUTPUT: I/O last 3 completed shifts: In: 3035.6 [P.O.:240; I.V.:2165.6; Blood:630] Out: 1600 [Urine:1600]  PHYSICAL EXAMINATION: General:  Sedated and intubated,  Neuro:  Sedated, good muscle tone,  HEENT:  Ncat, mmm Cardiovascular:  Rrr, no murmurs Lungs:  Vent assisted breath sounds, mild rhonchi heard anteriorly  Abdomen:  Soft, nondistended, normal BS Musculoskeletal:  Normal pulses in extremities Skin:  C/d/i  LABS:  BMET Recent Labs  Lab 01/01/18 0528 01/02/18 0537 01/06/18 1503  NA 135 132* 138  K 4.0 3.5 3.8  CL 101 99*  --   CO2 28 26  --   BUN 10 8  --   CREATININE 0.78 0.69  --   GLUCOSE 106* 101* 111*    Electrolytes Recent Labs  Lab 01/01/18 0528 01/02/18 0537  CALCIUM 8.2* 8.1*  MG 1.1* 0.9*    CBC Recent Labs  Lab 01/04/18 0615 01/05/18 0601 01/06/18 0749 01/06/18 1503  WBC 5.4 5.5 5.4  --   HGB 8.3* 8.4* 7.8* 10.5*  HCT 26.5* 26.3* 25.8* 31.0*  PLT 151 180 160  --     Coag's Recent Labs  Lab 01/03/18 0158 01/03/18 0829 01/04/18 0615  APTT 72* 82* 91*    Sepsis Markers No results for input(s): LATICACIDVEN, PROCALCITON, O2SATVEN in the last 168 hours.  ABG No results for input(s): PHART, PCO2ART, PO2ART in the last 168 hours.  Liver Enzymes No results for input(s): AST, ALT, ALKPHOS, BILITOT,  ALBUMIN in the last 168 hours.  Cardiac Enzymes No results for input(s): TROPONINI, PROBNP in the last 168 hours.  Glucose No results for input(s): GLUCAP in the last 168 hours.  Imaging No results found.   STUDIES:    CULTURES:   ANTIBIOTICS:   SIGNIFICANT EVENTS: 1/10 Thrombectomy    LINES/TUBES: ETT 1/10 >>  DISCUSSION:  57 yo patient with with metastatic colon carcinoma on palliative chemotherapy,  Anemia of chronic disease, who presented with acute left lower extremity DVT, underwent a thrombectomy today and transferred to ICU overnight for vent    ASSESSMENT / PLAN:  PULMONARY A: Post-op on ventilator  Acute left lower extremity DVT - on IV heparin prior to thrombectomy, and pt has hypercoagulable state.   P:    Vent settings: PRVC, Tidal volume 560,  rate of 16, FiO2 of 60, pressure support of 5   Per IR: - Maintain sterile dressings with pressure at the right IJ and right CFV access sites.  VIR will follow up tomorrow - Routine wound care. - Starting back 1000U per hour heparin, with hep level/PTT check in 2 hours - Labs in am - Observing hemoglobinuria at the conclusion of case, which is expected after Angiojet thrombolysis.  Will run Saline 150cc/hr x 8 hours. Observe for clearing - Strict I/O's - Target normotension    CARDIOVASCULAR A:  S/p IVC filter retrieval AngioVac aspiration thrombectomy of IVC and Iliac thrombus Balloon angioplasty and stenting of chronic left external iliac vein occlusion Balloon angioplasty for maceration of LLE thrombus AngioJet aspiration thrombectomy of the L femoral, CFV, and iliac system for DVT treatment  P:  -goal normotensive, rest of the plan as above    RENAL A:   No acute issues  P:     GASTROINTESTINAL A:   prior history of GI bleeding from his left colon mass when he was onmaintenance Xarelto,he is at high risk for recurrent bleeding from anticoagulation  P:   -monitor CBCs -protonix    HEMATOLOGIC- Oncologic  A:   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  Anaemia of chronic disease - underlying anemia in setting of colon cancer and secondary to chemotherapy - has prior history of GI bleed, and high risk of bleeding and further blood loss, and on AC- HgB today 7.8  P:  -transfuse if HgB < 7 -monitor CBCs -resume Lovenox post thrombectomy   -on palliative chemotherapy 5-FU and panitumumab -last chemop session was 12/1 and follows with Dr Burr Medico    INFECTIOUS A:   No acute issues P:     ENDOCRINE A:    P:     NEUROLOGIC A:   Sedated and intubated   P:   RASS goal: 0,-1 -on fentanyl and versed for sedation   FAMILY  - Updates:   - Inter-disciplinary family meet or Palliative Care meeting due by: 1/15    Pulmonary and [place] Pager: [phone]  01/06/2018, 8:38 PM

## 2018-01-06 NOTE — Sedation Documentation (Signed)
Dressing applied to L 9Fr. Popliteal puncture site. Level 0. drsg CDI.

## 2018-01-06 NOTE — Anesthesia Procedure Notes (Signed)
Procedure Name: Intubation Date/Time: 01/06/2018 12:48 PM Performed by: Moshe Salisbury, CRNA Pre-anesthesia Checklist: Patient identified, Emergency Drugs available, Suction available and Patient being monitored Patient Re-evaluated:Patient Re-evaluated prior to induction Oxygen Delivery Method: Circle System Utilized Preoxygenation: Pre-oxygenation with 100% oxygen Induction Type: IV induction Ventilation: Mask ventilation without difficulty Laryngoscope Size: McGraph and 3 Grade View: Grade I Tube type: Oral Tube size: 7.5 mm Number of attempts: 2 Airway Equipment and Method: Stylet and Oral airway Placement Confirmation: ETT inserted through vocal cords under direct vision,  positive ETCO2 and breath sounds checked- equal and bilateral Tube secured with: Tape Dental Injury: Teeth and Oropharynx as per pre-operative assessment  Difficulty Due To: Difficult Airway- due to dentition Future Recommendations: Recommend- induction with short-acting agent, and alternative techniques readily available

## 2018-01-06 NOTE — Procedures (Signed)
Interventional Radiology Procedure Note  History: Acute iliocaval thrombosis, with extension into the left CFV and femoral vein, secondary to filter and hx of malignancy.   Operators: Dr. Laurence Ferrari, Dr Loletha Grayer Perfusion Team  Anesthesia: GETA  Procedure:   US guided access of 2 parallel access right IJ (32F & 38F) US guided access of left popliteal vein (9) US guided access of right  CFV (38F)  IVC filter retrieval  AngioVac aspiration thrombectomy of IVC and Iliac thrombus Balloon angioplasty and stenting of chronic left external iliac vein occlusion Balloon angioplasty for maceration of LLE thrombus AngioJet aspiration thrombectomy of the L femoral, CFV, and iliac system for DVT treatment  Findings: Pre: iliocaval thrombus up to the IVC filter, complete thrombosis of the left iliac and femoral system Post: restoration of flow through the bilateral iliac veins and through the IVC, in-line back to the heart  Complications: None  EBL:  250cc  Recommendations:  - To ICU intubated, thank you for help from CC - Maintain sterile dressings with pressure at the right IJ and right CFV access sites.  VIR will follow up tomorrow - Routine wound care. - Starting back 1000U per hour heparin, with hep level/PTT check in 2 hours - Labs in am - Observing hemoglobinuria at the conclusion of case, which is expected after Angiojet thrombolysis.  Will run Saline 150cc/hr x 8 hours. Observe for clearing - Strict I/O's - Target normotension  Signed,  Dulcy Fanny. Earleen Newport, DO

## 2018-01-06 NOTE — Progress Notes (Signed)
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  Patient was placed on anticoagulation and interventional radiology was consulted.  After discussing with the patient and the patient's daughter, plans are for thrombectomy at [facility], and will transfer patient today.  Assessment & Plan: Principal Problem:   Evaluation by psychiatric service required Active Problems:   DVT (deep venous thrombosis) (HCC)   Malignant neoplasm of colon [facility])   Palliative care by specialist   Advance care planning   Left lowextremity DVT, acute -Continue IV heparin, this is in the setting of hypercoagulable state given underlying malignancy -Patient was transferred from [facility] long hospital to [facility] on 1/9, today he is planned to have thrombectomy by interventional radiology -CT scan showed extensive thrombosis in the IVC, common iliac, left iliac vein and femoral vein -prior history of GI bleeding from his left colon mass when he was onmaintenance Xarelto,he is at high risk for recurrent bleeding from anticoagulation,  -MRI brain without metastatic disease -Pt agrees to continueanticoagulation after procedure, will need Lovenox  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 -on palliative chemotherapy 5-FU and Panitumumab -last chemo session 11/27/2017, follows with Dr Burr Medico  Acute on chronic chronic anemia of chronic disease -anemia in the setting of underlying colon cancer & secondary to chemotherapy, high risk for bleeding and further blood loss and on anticoagulation watch  closely and transfuse as clinically indicated -No evidence of bleeding today, hemoglobin slightly lower at 7.8, continue to closely monitor  Moderate Protein and calorie malnutrition with Anorexia -supplements advised dietitian consult requested, c/n Remeron for appetite stimulation   Social/Ethics -patient is a full code, palliative care consult requested, trying to reach patient's daughter however patient has not given permission to talk to his daughter.  Psychiatric consult to help determine capacity to make decisions  noted from Dr Mariea Clonts on 01/04/18.  Patient has poor decision-making capacity please contact patient's daughter [name] if consent for procedures if needed, please see psychiatric consult note dated 01/04/2018   DVT prophylaxis: heparin infusion Code Status: Full code Family Communication: no family at bedside Disposition Plan: Home when ready  Consultants:   IR  Oncology  Palliative care  Psychiatry   Procedures:   None   Antimicrobials:  None    Subjective: -Complains of left lower extremity pain, no chest pain, no shortness of breath, awaiting procedure  Objective: Vitals:   01/05/18 1947 01/06/18 0013 01/06/18 0427 01/06/18 0750  BP: 100/74 95/76 107/64 103/70  Pulse: 94 94 92 95  Resp: 14 14 14 20   Temp: 98.5 F (36.9 C) 98.6 F (37 C) 98.2 F (36.8 C) 98.6 F (37 C)  TempSrc: Oral Oral Oral Oral  SpO2: 100% 100% 99% 100%  Weight:   60.9 kg (134 lb 3.2 oz)   Height:        Intake/Output Summary (Last 24 hours) at 01/06/2018 1338 Last data filed at 01/06/2018 1300 Gross per 24 hour  Intake 405.6 ml  Output 50 ml  Net 355.6 ml   Filed Weights   12/31/17 1215 01/05/18 1829 01/06/18 0427  Weight: 59.4 kg (131 lb)  61.9 kg (136 lb 8 oz) 60.9 kg (134 lb 3.2 oz)    Examination:  Constitutional: No distress Eyes: No scleral icterus, lids and conjunctivae normal ENMT: moist mucous membranes Respiratory: Clear to auscultation, no  wheezing, no crackles Cardiovascular: Regular rate and rhythm, no murmurs, 1+ lower extremity edema on the left Abdomen: Soft, nontender, ostomy in place Musculoskeletal:  LLE with diffuse tight swelling calf and thigh area   Skin: Slightly erythematous left lower extremity but otherwise no rashes Neurologic: Nonfocal   Data Reviewed: I have independently reviewed following labs and imaging studies  CBC: Recent Labs  Lab 12/30/17 1907  01/02/18 0537 01/03/18 0158 01/04/18 0615 01/05/18 0601 01/06/18 0749  WBC 6.4   < > 4.8 5.0 5.4 5.5 5.4  NEUTROABS 4.6  --   --   --   --   --   --   HGB 9.4*   < > 8.4* 8.1* 8.3* 8.4* 7.8*  HCT 29.3*   < > 26.3* 25.6* 26.5* 26.3* 25.8*  MCV 83.0   < > 83.8 83.9 84.4 84.3 84.6  PLT 156   < > 139* 145* 151 180 160   < > = values in this interval not displayed.   Basic Metabolic Panel: Recent Labs  Lab 12/30/17 1907 01/01/18 0528 01/02/18 0537  NA 134* 135 132*  K 3.4* 4.0 3.5  CL 100* 101 99*  CO2 26 28 26   GLUCOSE 109* 106* 101*  BUN 9 10 8   CREATININE 0.80 0.78 0.69  CALCIUM 8.2* 8.2* 8.1*  MG  --  1.1* 0.9*   GFR: Estimated Creatinine Clearance: 86.7 mL/min (by C-G formula based on SCr of 0.69 mg/dL). Liver Function Tests: Recent Labs  Lab 12/30/17 1907  AST 22  ALT 8*  ALKPHOS 66  BILITOT 0.7  PROT 8.4*  ALBUMIN 3.2*   No results for input(s): LIPASE, AMYLASE in the last 168 hours. No results for input(s): AMMONIA in the last 168 hours. Coagulation Profile: No results for input(s): INR, PROTIME in the last 168 hours. Cardiac Enzymes: No results for input(s): CKTOTAL, CKMB, CKMBINDEX, TROPONINI in the last 168 hours. BNP (last 3 results) No results for input(s): PROBNP in the last 8760 hours. HbA1C: No results for input(s): HGBA1C in the last 72 hours. CBG: No results for input(s): GLUCAP in the last 168 hours. Lipid Profile: No results for input(s): CHOL, HDL, LDLCALC, TRIG, CHOLHDL, LDLDIRECT in the last 72  hours. Thyroid Function Tests: No results for input(s): TSH, T4TOTAL, FREET4, T3FREE, THYROIDAB in the last 72 hours. Anemia Panel: No results for input(s): VITAMINB12, FOLATE, FERRITIN, TIBC, IRON, RETICCTPCT in the last 72 hours. Urine analysis:    Component Value Date/Time   COLORURINE YELLOW 12/30/2017 2050   APPEARANCEUR CLEAR 12/30/2017 2050   LABSPEC >1.046 (H) 12/30/2017 2050   [place] 5.0 12/30/2017 2050   GLUCOSEU NEGATIVE 12/30/2017 2050   [place] NEGATIVE 12/30/2017 2050   [place] NEGATIVE 12/30/2017 2050   [place] 12/30/2017 2050   PROTEINUR NEGATIVE 12/30/2017 2050   NITRITE NEGATIVE 12/30/2017 2050   LEUKOCYTESUR NEGATIVE 12/30/2017 2050   Sepsis Labs: Invalid input(s): PROCALCITONIN, LACTICIDVEN  Recent Results (from the past 240 hour(s))  Urine culture     Status: None   Collection Time: 12/30/17  8:50 PM  Result Value Ref Range Status   Specimen Description URINE, CLEAN CATCH  Final   Special Requests NONE  Final   Culture   Final    NO GROWTH Performed at [facility]  Hospital Lab, [place] [address] [place], [place] [address]    Report Status 01/01/2018 FINAL  Final  Surgical pcr screen     Status: Abnormal   Collection Time: 01/05/18  6:15 PM  Result Value Ref Range Status   MRSA, PCR NEGATIVE NEGATIVE Final   Staphylococcus aureus POSITIVE (A) NEGATIVE Final    Comment: (NOTE) The Xpert SA Assay (FDA approved for NASAL specimens in patients 80 years of age and older), is one component of a comprehensive surveillance program. It is not intended to diagnose infection nor to guide or monitor treatment.       Radiology Studies: Mr Jeri Cos Wo Contrast  Result Date: 01/05/2018 CLINICAL DATA:  Metastatic colon cancer staging EXAM: MRI HEAD WITHOUT AND WITH CONTRAST TECHNIQUE: Multiplanar, multiecho pulse sequences of the brain and surrounding structures were obtained without and with intravenous contrast. CONTRAST:  61mL MULTIHANCE GADOBENATE  DIMEGLUMINE 529 MG/ML IV SOLN COMPARISON:  CT head 11/30/2014 FINDINGS: Brain: Ventricle size and cerebral volume normal. Negative for infarct, hemorrhage, or mass. No edema. Normal enhancement following contrast infusion. Vascular: Normal arterial flow voids Skull and upper cervical spine: Negative Sinuses/Orbits: Negative Other: None IMPRESSION: Negative MRI head with contrast.  Negative for metastatic disease. Electronically Signed   By: Franchot Gallo M.D.   On: 01/05/2018 11:40     Scheduled Meds: . [MAR Hold] Chlorhexidine Gluconate Cloth  6 each Topical Daily  . [MAR Hold] docusate sodium  100 mg Oral BID  . [MAR Hold] feeding supplement (ENSURE ENLIVE)  120 mL Oral TID  . [MAR Hold] gabapentin  100 mg Oral TID  . [MAR Hold] mirtazapine  15 mg Oral QHS  . [MAR Hold] multivitamin with minerals  1 tablet Oral Daily  . [MAR Hold] mupirocin ointment  1 application Nasal BID  . [MAR Hold] oxyCODONE  10 mg Oral Q12H   Continuous Infusions: . [MAR Hold] sodium chloride    . heparin 1,200 Units/hr (01/06/18 1149)    Marzetta Board, MD, PhD [facility] Pager [phone]  If 7PM-7AM, please contact night-coverage [email or web address] Password [email or web address] 01/06/2018, 1:38 PM

## 2018-01-06 NOTE — Sedation Documentation (Signed)
R IJ puncture dressed with Gauze/Tegaderm, reinforced with pink tape. Level 0. Drsg CDI.

## 2018-01-07 ENCOUNTER — Encounter (HOSPITAL_COMMUNITY): Payer: Self-pay | Admitting: Interventional Radiology

## 2018-01-07 DIAGNOSIS — I824Z2 Acute embolism and thrombosis of unspecified deep veins of left distal lower extremity: Secondary | ICD-10-CM

## 2018-01-07 LAB — BASIC METABOLIC PANEL
ANION GAP: 9 (ref 5–15)
ANION GAP: 8 (ref 5–15)
BUN: 9 mg/dL (ref 6–20)
BUN: 11 mg/dL (ref 6–20)
CALCIUM: 7.7 mg/dL — AB (ref 8.9–10.3)
CO2: 23 mmol/L (ref 22–32)
CO2: 24 mmol/L (ref 22–32)
CREATININE: 0.8 mg/dL (ref 0.61–1.24)
Calcium: 7.5 mg/dL — ABNORMAL LOW (ref 8.9–10.3)
Chloride: 102 mmol/L (ref 101–111)
Chloride: 104 mmol/L (ref 101–111)
Creatinine, Ser: 0.73 mg/dL (ref 0.61–1.24)
GFR calc Af Amer: >60 mL/min (ref >60)
GLUCOSE: 136 mg/dL — AB (ref 65–99)
Glucose, Bld: 151 mg/dL — ABNORMAL HIGH (ref 65–99)
POTASSIUM: 4.4 mmol/L (ref 3.5–5.1)
Potassium: 4.5 mmol/L (ref 3.5–5.1)
SODIUM: 134 mmol/L — AB (ref 135–145)
Sodium: 136 mmol/L (ref 135–145)

## 2018-01-07 LAB — CBC
HCT: 23.1 % — ABNORMAL LOW (ref 39.0–52.0)
Hemoglobin: 7.5 g/dL — ABNORMAL LOW (ref 13.0–17.0)
MCH: 26.8 pg (ref 26.0–34.0)
MCHC: 32.5 g/dL (ref 30.0–36.0)
MCV: 82.5 fL (ref 78.0–100.0)
PLATELETS: 128 10*3/uL — AB (ref 150–400)
RBC: 2.8 MIL/uL — ABNORMAL LOW (ref 4.22–5.81)
RDW: 16.6 % — AB (ref 11.5–15.5)
WBC: 6.7 10*3/uL (ref 4.0–10.5)

## 2018-01-07 LAB — MAGNESIUM: MAGNESIUM: 0.9 mg/dL — AB (ref 1.7–2.4)

## 2018-01-07 LAB — CBC WITH DIFFERENTIAL/PLATELET
BASOS ABS: 0 10*3/uL (ref 0.0–0.1)
BASOS PCT: 0 %
EOS ABS: 0 10*3/uL (ref 0.0–0.7)
EOS PCT: 0 %
HCT: 23.9 % — ABNORMAL LOW (ref 39.0–52.0)
Hemoglobin: 7.9 g/dL — ABNORMAL LOW (ref 13.0–17.0)
LYMPHS PCT: 17 %
Lymphs Abs: 0.8 10*3/uL (ref 0.7–4.0)
MCH: 27.3 pg (ref 26.0–34.0)
MCHC: 33.1 g/dL (ref 30.0–36.0)
MCV: 82.7 fL (ref 78.0–100.0)
Monocytes Absolute: 0.4 10*3/uL (ref 0.1–1.0)
Monocytes Relative: 8 %
Neutro Abs: 3.7 10*3/uL (ref 1.7–7.7)
Neutrophils Relative %: 75 %
PLATELETS: 115 10*3/uL — AB (ref 150–400)
RBC: 2.89 MIL/uL — ABNORMAL LOW (ref 4.22–5.81)
RDW: 16.7 % — AB (ref 11.5–15.5)
WBC: 5 10*3/uL (ref 4.0–10.5)

## 2018-01-07 LAB — BLOOD GAS, ARTERIAL
Acid-Base Excess: 2.7 mmol/L — ABNORMAL HIGH (ref 0.0–2.0)
Bicarbonate: 26 mmol/L (ref 20.0–28.0)
DRAWN BY: 252031
FIO2: 40
LHR: 12 {breaths}/min
O2 Saturation: 99.4 %
PEEP: 5 cmH2O
Patient temperature: 97.1
VT: 560 mL
pCO2 arterial: 33.7 mmHg (ref 32.0–48.0)
pH, Arterial: 7.495 — ABNORMAL HIGH (ref 7.350–7.450)
pO2, Arterial: 201 mmHg — ABNORMAL HIGH (ref 83.0–108.0)

## 2018-01-07 LAB — POCT ACTIVATED CLOTTING TIME
ACTIVATED CLOTTING TIME: 202 s
Activated Clotting Time: 274 s
Activated Clotting Time: 224 s

## 2018-01-07 LAB — PHOSPHORUS: PHOSPHORUS: 3.7 mg/dL (ref 2.5–4.6)

## 2018-01-07 LAB — HEPARIN LEVEL (UNFRACTIONATED)
Heparin Unfractionated: 0.55 IU/mL (ref 0.30–0.70)
Heparin Unfractionated: 0.16 IU/mL — ABNORMAL LOW (ref 0.30–0.70)
Heparin Unfractionated: <0.1 [IU]/mL — ABNORMAL LOW (ref 0.30–0.70)

## 2018-01-07 LAB — TRIGLYCERIDES: Triglycerides: 84 mg/dL (ref <150)

## 2018-01-07 LAB — LACTIC ACID, PLASMA: LACTIC ACID, VENOUS: 1.2 mmol/L (ref 0.5–1.9)

## 2018-01-07 MED ORDER — DOCUSATE SODIUM 100 MG PO CAPS
100.0000 mg | ORAL_CAPSULE | Freq: Two times a day (BID) | ORAL | Status: DC
  Administered 2018-01-08 – 2018-01-11 (×6): 100 mg via ORAL
  Filled 2018-01-07 (×6): qty 1

## 2018-01-07 MED ORDER — SODIUM CHLORIDE 0.9 % IV BOLUS (SEPSIS)
500.0000 mL | Freq: Once | INTRAVENOUS | Status: AC
  Administered 2018-01-07: 500 mL via INTRAVENOUS

## 2018-01-07 MED ORDER — WHITE PETROLATUM EX OINT
TOPICAL_OINTMENT | CUTANEOUS | Status: AC
  Administered 2018-01-07: 10:00:00
  Filled 2018-01-07: qty 28.35

## 2018-01-07 MED ORDER — MAGNESIUM SULFATE 2 GM/50ML IV SOLN
2.0000 g | Freq: Once | INTRAVENOUS | Status: AC
  Administered 2018-01-07: 2 g via INTRAVENOUS
  Filled 2018-01-07: qty 50

## 2018-01-07 MED ORDER — HEPARIN (PORCINE) IN NACL 100-0.45 UNIT/ML-% IJ SOLN: 950.0000 [IU]/h | INTRAMUSCULAR | Status: DC

## 2018-01-07 MED ORDER — GABAPENTIN 100 MG PO CAPS
100.0000 mg | ORAL_CAPSULE | Freq: Three times a day (TID) | ORAL | Status: DC
  Administered 2018-01-07 – 2018-01-11 (×11): 100 mg via ORAL
  Filled 2018-01-07 (×11): qty 1

## 2018-01-07 MED ORDER — PANTOPRAZOLE SODIUM 40 MG PO TBEC
40.0000 mg | DELAYED_RELEASE_TABLET | Freq: Two times a day (BID) | ORAL | Status: DC
  Administered 2018-01-08 – 2018-01-11 (×7): 40 mg via ORAL
  Filled 2018-01-07 (×7): qty 1

## 2018-01-07 NOTE — Progress Notes (Addendum)
ANTICOAGULATION CONSULT NOTE - Follow-Up Consult  Pharmacy Consult for heparin Indication: DVT  No Known Allergies  Patient Measurements: Height: 5\' 9"  (175.3 cm) Weight: 139 lb 8.8 oz (63.3 kg) IBW/kg (Calculated) : 70.7 Heparin Dosing Weight: 59.4 kg  Vital Signs: Temp: 97 F (36.1 C) (01/11 0000) Temp Source: Axillary (01/11 0000) BP: 95/75 (01/11 0115) Pulse Rate: 69 (01/11 0000)  Labs: Recent Labs    01/04/18 0615 01/05/18 0601 01/06/18 0749 01/06/18 1503 01/06/18 2311  HGB 8.3* 8.4* 7.8* 10.5* 7.9*  HCT 26.5* 26.3* 25.8* 31.0* 23.9*  PLT 151 180 160  --  115*  APTT 91*  --   --   --   --   HEPARINUNFRC 0.53 0.49 0.33  --  0.72*  CREATININE  --   --   --   --  0.73    Estimated Creatinine Clearance: 90.1 mL/min (by C-G formula based on SCr of 0.73 mg/dL).   Medical History: Past Medical History:  Diagnosis Date  . Acute deep vein thrombosis (DVT) of right lower extremity ([place]) 02/18/2017  . Malignant neoplasm of abdomen ([place])    [name] 01/11/2017  . Microcytic anemia    /notes 01/11/2017    Assessment: 57 yo M with new DVT.  Pharmacy consulted to dose low dose heparin with no bolus after pt experienced a nosebleed with Eliquis.  LLE acute DVT diagnosed 1/3, CT scan showed extensive thrombosis in the IVC, common iliac, left iliac vein and femoral vein.  Pt has history of GI bleeding while on Xarelto and also reported a nosebleed while on Eliquis.  Last dose of Eliquis was 1/4 evening.  Pt continues on heparin in the interim.  MD request NO BOLUS and low goal. RN called to report bleeding from multiple sites, she spoke with the MD. Due to the necessity of heparin, decision was made to keep patient on heparin and monitor. Heparin level this evening returned SUPRAtherapeutic.   Goal of Therapy:  Heparin level 0.2 - 0.5 - low goal per MD request Monitor platelets by anticoagulation protocol: Yes   Plan:  -Reduce heparin to 800 units/hr -Daily HL,  CBC -Recheck level in AM -Discussed with RN   Yussuf Sawyers, Jake Church  01/07/2018 2:16 AM   Addendum -HL of 0.55 was drawn too early, only ~2 hours after rate change, please ignore this level and continue with 0800 level   01/07/2018 4:43 AM

## 2018-01-07 NOTE — Progress Notes (Signed)
Attempted to call report to 4 E. Report given to oncoming shift, Merry Proud. Fentanyl 163ml wasted with Obie Dredge RN

## 2018-01-07 NOTE — Progress Notes (Signed)
Referring Physician(s): Feng,Y  Supervising Physician: Jacqulynn Cadet  Patient Status:  [facility] - In-pt  Chief Complaint:  LLE DVT and caval thrombosis  HPI: Acute iliocaval thrombosis, with extension into the left CFV and femoral vein, secondary to filter and hx of malignancy.   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 Laurence Ferrari and Dr. Earleen Newport on 01/07/2018  Subjective:  Patient seen by Dr. Laurence Ferrari. He has been extubated. He was alert and sitting up in bed. No complaints.   Allergies: Patient has no known allergies.  Medications: Prior to Admission medications   Medication Sig Start Date End Date Taking? Authorizing Provider  diphenoxylate-atropine (LOMOTIL) 2.5-0.025 MG tablet Take 1-2 tablets by mouth 4 (four) times daily as needed for diarrhea or loose stools. 12/13/17  Yes [name], MD  docusate sodium (COLACE) 100 MG capsule Take 100 mg by mouth 2 (two) times daily.   Yes [provider]  gabapentin (NEURONTIN) 100 MG capsule Take 1 capsule (100 mg total) 3 (three) times daily by mouth. 11/15/17  Yes [name], MD  Nutritional Supplements (NUTRITIONAL SUPPLEMENT PO) House Supplement - give 120 ml by mouth three times daily between meals for supplement   Yes [provider]  Oxycodone HCl 10 MG TABS Take 1 tablet (10 mg total) by mouth every 6 (six) hours as needed. 12/13/17  Yes [name], MD  Clindamycin Phos-Benzoyl Perox gel Apply 1 application topically 2 (two) times daily. Patient not taking: Reported on 12/30/2017 12/27/17   [name], MD  ferrous sulfate 325 (65 FE) MG tablet Take 1 tablet (325 mg total) by mouth 2 (two) times daily with a meal. Patient not  taking: Reported on 12/27/2017 03/29/17   Maryanna Shape, NP  hydrocortisone 2.5 % cream Apply topically 2 (two) times daily. Patient not taking: Reported on 12/30/2017 10/18/17   Alla Feeling, NP  loperamide (IMODIUM A-D) 2 MG tablet Take 1 tablet (2 mg total) by mouth 4 (four) times daily as needed for diarrhea or loose stools. Patient not taking: Reported on 12/30/2017 03/02/17   [name], MD  magnesium oxide (MAG-OX) 400 (241.3 Mg) MG tablet Take 1 tablet (400 mg total) by mouth 2 (two) times daily. Patient not taking: Reported on 12/30/2017 12/13/17   [name], MD  magnesium oxide (MAG-OX) 400 (241.3 Mg) MG tablet Take 1 tablet (400 mg total) by mouth daily. Patient not taking: Reported on 12/30/2017 12/27/17   [name], MD  mirtazapine (REMERON) 15 MG tablet Take 1 tablet (15 mg total) by mouth at bedtime. Patient not taking: Reported on 12/27/2017 06/22/17   Gardenia Phlegm, NP  ondansetron (ZOFRAN) 8 MG tablet Take 1 tablet (8 mg total) by mouth 2 (two) times daily as needed for refractory nausea / vomiting. Start on day 3 after chemotherapy. Patient not taking: Reported on 12/30/2017 05/25/17   [name], MD  potassium chloride SA (K-DUR,KLOR-CON) 20 MEQ tablet Take 1 tablet (20 mEq total) by mouth 2 (two) times daily. Patient not taking: Reported on 12/30/2017 12/27/17   [name], MD  prochlorperazine (COMPAZINE) 10 MG tablet Take 1 tablet (10 mg total) by mouth every 6 (six) hours as needed (Nausea or vomiting). Patient not taking: Reported on 12/30/2017 05/25/17  [name], MD     Vital Signs: BP 116/67   Pulse (!) 107   Temp 98.8 F (37.1 C) (Oral)   Resp 18   Ht 5\' 9"  (1.753 m)   Wt 139 lb 8.8 oz (63.3 kg)   SpO2 100%   BMI 20.61 kg/m   Physical Exam Awake and alert NAD Neck hematoma resolved Access sites ok Legs with improved edema.  Imaging: Mr Jeri Cos MW Contrast  Result Date: 01/05/2018 CLINICAL DATA:  Metastatic colon cancer staging EXAM: MRI HEAD WITHOUT  AND WITH CONTRAST TECHNIQUE: Multiplanar, multiecho pulse sequences of the brain and surrounding structures were obtained without and with intravenous contrast. CONTRAST:  74mL MULTIHANCE GADOBENATE DIMEGLUMINE 529 MG/ML IV SOLN COMPARISON:  CT head 11/30/2014 FINDINGS: Brain: Ventricle size and cerebral volume normal. Negative for infarct, hemorrhage, or mass. No edema. Normal enhancement following contrast infusion. Vascular: Normal arterial flow voids Skull and upper cervical spine: Negative Sinuses/Orbits: Negative Other: None IMPRESSION: Negative MRI head with contrast.  Negative for metastatic disease. Electronically Signed   By: Franchot Gallo M.D.   On: 01/05/2018 11:40   Ir Veno/ext/bi  Result Date: 01/07/2018 INDICATION: 57 year-old male with metastatic colon cancer an acute severe iliocaval and left lower extremity DVT. He presents for thrombectomy. EXAM: 1. Ultrasound-guided vascular access left popliteal vein 2. Ultrasound-guided access right common femoral vein 3. Ultrasound-guided access right internal jugular vein 4. Second ultrasound-guided access right internal jugular vein 5. Left lower extremity venogram 6. Right lower extremity venogram 7. Inferior vena cavogram 8. Combined pharmacomechanical thrombolysis/thrombectomy using tPA, the AngioVac device, and the Angiojet device 9. IVC filter retrieval 10. Recanalization and balloon angioplasty of the right common iliac vein 11. Recanalization and angioplasty with subsequent stent placement in the left iliac vein 12. Balloon angioplasty of the left common femoral and femoral veins Operating Physicians: Corrie Mckusick, DO and Jacqulynn Cadet, MD COMPARISON:  CT scan 12/30/2017 MEDICATIONS: 12 mg tPA administered directly into thrombus throughout the course of the procedure ANESTHESIA/SEDATION: General anesthesia provided by the anesthesiology service FLUOROSCOPY TIME:  Fluoroscopy Time: 67 minutes 6 seconds (816.6 mGy). COMPLICATIONS: None  immediate. TECHNIQUE: Informed written consent was obtained from the patient after a thorough discussion of the procedural risks, benefits and alternatives. All questions were addressed. Maximal Sterile Barrier Technique was utilized including caps, mask, sterile gowns, sterile gloves, sterile drape, hand hygiene and skin antiseptic. A timeout was performed prior to the initiation of the procedure. The patient was initially positioned prone. The left popliteal vein was interrogated with ultrasound and found to be widely patent. An image was obtained and stored for the medical record. Local anesthesia was attained by infiltration with 1% lidocaine. A small dermatotomy was made. Under real-time sonographic guidance, the vessel was punctured with a 21 gauge micropuncture needle. Using standard technique, the initial micro needle was exchanged over a 0.018 micro wire for a transitional 4 [place] micro sheath. The micro sheath was then exchanged over a 0.035 wire for a 45 cm 6 [place] braided Arrow sheath. A left lower extremity venogram was performed. The femoral vein is completely occluded in the thigh. The popliteal vein remains patent. 6 mg tPA was laced throughout the thrombus within the common and femoral veins. The patient was repositioned supine and intubated. The right common femoral was interrogated with ultrasound and found to be patent with changes of chronic DVT. An image was obtained and stored for the medical record. Local anesthesia was attained by infiltration with 1% lidocaine. A  small dermatotomy was made. Under real-time sonographic guidance, the vessel was punctured with a 21 gauge micropuncture needle. Using standard technique, the initial micro needle was exchanged over a 0.018 micro wire for a transitional 4 [place] micro sheath. The micro sheath was then exchanged over a 0.035 wire for a 6 French vascular sheath. Right lower extremity venography was performed. The common femoral and external iliac  veins are patent. 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 All venous drainage is via parallel spinous collaterals. An angled catheter and glidewire were introduced through the sheath. With some manipulation, the catheter was successfully advanced into the suprarenal IVC. An inferior vena cavagram was performed confirming patency of both renal veins and the super renal IVC. A superstiff Amplatz wire was advanced into the right heart. A 10 x 40 mm Conquest balloon was introduced over the wire the occluded segment of the common iliac vein was angioplastied. Repeat venography demonstrates recreation of a small vascular channel. A 10 x 40 mm Conquest balloon was then selected and introduced over the wire. Venography demonstrates successful recanalization of the common iliac vein. There is flow through the IVC. 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 The 6 French sheath was exchanged in the soft tissue tract dilated to 24 [place]. A 26 French Gore dry seal sheath was then advanced over the wire and positioned in the right common iliac vein. The right internal jugular vein was interrogated with ultrasound and found to be widely patent. An image was obtained and stored for the medical record. Local anesthesia was attained by infiltration with 1% lidocaine. A small dermatotomy was made. Under real-time sonographic guidance, the vessel was punctured with a 21 gauge micropuncture needle. Using standard technique, the initial micro needle was exchanged over a 0.018 micro wire for a transitional 4 [place] micro sheath. The micro sheath was then exchanged over a 0.035 wire for a 6 French vascular sheath. A superstiff Amplatz wire was advanced into the suprarenal IVC. The skin tract was then serially dilated and an 75 French reperfusion catheter was advanced and position with the tip at the cavoatrial junction. At this time, the patient was fully heparinized until the ACT  level was greater than 300 and the AngioVac circuit was established. The angio [facility] device was advanced through the 33 French sheath from the right common femoral approach and suction thrombectomy was performed throughout the infrarenal inferior vena cava. Working from the popliteal access, additional left lower extremity venography images were performed focusing on the iliac and common femoral vasculature. Thrombus is present within the common femoral vein and the external iliac vein. The left common iliac vein is completely occluded similar to the other side. Attempts were made to pass through the occluded left common iliac vein from below. This was unsuccessful. Therefore, a second right internal jugular venous access was required. The right internal jugular vein was was again interrogated with ultrasound and found to be widely patent. An image was obtained and stored for the medical record. Local anesthesia was attained by infiltration with 1% lidocaine. A small dermatotomy was made. Under real-time sonographic guidance, the vessel was punctured with a 21 gauge micropuncture needle. Using standard technique, the initial micro needle was exchanged over a 0.018 micro wire for a transitional 4 [place] micro sheath. The micro sheath was then exchanged over a 0.035 wire for a 6 French vascular sheath. An angled catheter and glidewire were advanced through the sheath, through the IVC filter,  through the infrarenal IVC and the catheter successfully navigated across the chronically occluded left common iliac vein. The glidewire was then exchanged for an exchange length Glidewire and snared from the femoral vein in the midthigh and brought through the popliteal sheath access resulting in successful through and through access from the right internal jugular vein through the left popliteal vein. A catheter was then advanced over the wire into the femoral vein and the glidewire exchanged for a superstiff Amplatz wire. At this  time, another cavagram was performed demonstrating successful debulking of the caval thrombus. At this point it was decided to proceed with removal of the IVC filter. Over the superstiff Amplatz wire from the right jugular vein approach, the skin tract was dilated and a second 26 [place] Gore dry seal sheath was advanced and positioned in the suprarenal IVC. A Bard IVC filter retrieval set was then advanced coaxially through this device next to the Amplatz wire. The Bard [place] IVC filter was successfully retrieved using standard technique. Additional thrombectomy was then performed using the angio VAC device. This yielded significant material. Repeat vena cavagram demonstrates complete removal of thrombus from the inferior vena cava. Attention was next turned to the occluded left common iliac artery. The perfusion circuit was reversed and the reperfusion catheter was placed in the right femoral sheath and the angio back device was advanced through the right IJ sheath. Suction thrombectomy was performed in the right common iliac vein. Working from the popliteal access, angioplasty was performed of the common femoral, external iliac and common iliac veins and thrombus was pushed into the AngioVac. There appears to be a significant stenosis at the transition from the common iliac to the external iliac vein. This was resistant to initial angioplasty, so a 14 x 60 mm wall stent was selected, placed across the lesion and deployed. The wall stent was then post dilated to 12 mm using the 12 by 40 Conquest balloon. Catheter venography demonstrates significant near occlusive thrombus throughout the common femoral vein, and femoral vein in the thigh. Therefore, the decision was made to proceed with additional thrombectomy from the popliteal access using the [facility]. The 6 French left popliteal sheath was exchanged over the wire for a 9 French sheath. The is a long take catheter was advanced over the  wire and real lytic thrombectomy performed throughout the femoral, common femoral and iliac veins. Follow-up angiography demonstrates significant improvement with restoration of in-line flow. There is a significant residual stenosis at the common femoral vein. This was angioplastied to 12 mm using the 12 x 40 mm Conquest balloon. On the final angiographic runs, there is successful restoration of in-line flow from the popliteal access through the IVC as well as successful recanalization of both iliac veins. All wires and devices were removed. Once the patient's ACT drop below 200, the sheaths were removed. Hemostasis was attained with a combination of manual pressure and pursestring sutures at both 26 [place] sheath sites. 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 Significant stenosis at the transition from the left common to the left external iliac vein 3. 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 Successful recanalization and angioplasty of the chronically occluded right common iliac vein 2. Successful thrombolysis/thrombectomy of the inferior vena cava, left-sided iliac veins and left lower extremity deep venous system to the level of the popliteal vein. 3. Stent assisted angioplasty of a flow limiting stenosis at the interface of the left common and  external iliac veins. 4. Angioplasty of a stenotic segment of the left common femoral vein. 5. The IVC filter was retrieved.  No additional filter was placed. PLAN: 1. Continue full-dose heparin and transitioned to weight based Lovenox prior to discharge. 2. Progress toward ambulation as soon as clinically possible. 3. Compression garment or Ace wraps to the left lower extremity if needed to facilitate edema reduction. 4. Follow-up with Dr. Laurence Ferrari in interventional radiology clinic in 2 weeks. 5. If patient demonstrates any evidence of bleeding and needs to come off of his anticoagulation in the future, he must have an IVC  filter replaced. Signed, Criselda Peaches, MD [facility] Radiology Electronically Signed   By: Jacqulynn Cadet M.D.   On: 01/07/2018 10:46   Ir Mariana Arn Ivc  Result Date: 01/07/2018 INDICATION: 57 year-old male with metastatic colon cancer an acute severe iliocaval and left lower extremity DVT. He presents for thrombectomy. EXAM: 1. Ultrasound-guided vascular access left popliteal vein 2. Ultrasound-guided access right common femoral vein 3. Ultrasound-guided access right internal jugular vein 4. Second ultrasound-guided access right internal jugular vein 5. Left lower extremity venogram 6. Right lower extremity venogram 7. Inferior vena cavogram 8. Combined pharmacomechanical thrombolysis/thrombectomy using tPA, the AngioVac device, and the Angiojet device 9. IVC filter retrieval 10. Recanalization and balloon angioplasty of the right common iliac vein 11. Recanalization and angioplasty with subsequent stent placement in the left iliac vein 12. Balloon angioplasty of the left common femoral and femoral veins Operating Physicians: Corrie Mckusick, DO and Jacqulynn Cadet, MD COMPARISON:  CT scan 12/30/2017 MEDICATIONS: 12 mg tPA administered directly into thrombus throughout the course of the procedure ANESTHESIA/SEDATION: General anesthesia provided by the anesthesiology service FLUOROSCOPY TIME:  Fluoroscopy Time: 67 minutes 6 seconds (816.6 mGy). COMPLICATIONS: None immediate. TECHNIQUE: Informed written consent was obtained from the patient after a thorough discussion of the procedural risks, benefits and alternatives. All questions were addressed. Maximal Sterile Barrier Technique was utilized including caps, mask, sterile gowns, sterile gloves, sterile drape, hand hygiene and skin antiseptic. A timeout was performed prior to the initiation of the procedure. The patient was initially positioned prone. The left popliteal vein was interrogated with  ultrasound and found to be widely patent. An image was obtained and stored for the medical record. Local anesthesia was attained by infiltration with 1% lidocaine. A small dermatotomy was made. Under real-time sonographic guidance, the vessel was punctured with a 21 gauge micropuncture needle. Using standard technique, the initial micro needle was exchanged over a 0.018 micro wire for a transitional 4 [place] micro sheath. The micro sheath was then exchanged over a 0.035 wire for a 45 cm 6 [place] braided Arrow sheath. A left lower extremity venogram was performed. The femoral vein is completely occluded in the thigh. The popliteal vein remains patent. 6 mg tPA was laced throughout the thrombus within the common and femoral veins. The patient was repositioned supine and intubated. The right common femoral was interrogated with ultrasound and found to be patent with changes of chronic DVT. An image was obtained and stored for the medical record. Local anesthesia was attained by infiltration with 1% lidocaine. A small dermatotomy was made. Under real-time sonographic guidance, the vessel was punctured with a 21 gauge micropuncture needle. Using standard technique, the initial micro needle was exchanged over a 0.018 micro wire for a transitional 4 [place] micro sheath. The micro sheath was then exchanged over a 0.035 wire for a 6 French vascular sheath. Right lower extremity  venography was performed. The common femoral and external iliac veins are patent. 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 All venous drainage is via parallel spinous collaterals. An angled catheter and glidewire were introduced through the sheath. With some manipulation, the catheter was successfully advanced into the suprarenal IVC. An inferior vena cavagram was performed confirming patency of both renal veins and the super renal IVC. A superstiff Amplatz wire was advanced into the right heart. A 10 x 40 mm Conquest balloon was introduced over  the wire the occluded segment of the common iliac vein was angioplastied. Repeat venography demonstrates recreation of a small vascular channel. A 10 x 40 mm Conquest balloon was then selected and introduced over the wire. Venography demonstrates successful recanalization of the common iliac vein. There is flow through the IVC. 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 The 6 French sheath was exchanged in the soft tissue tract dilated to 24 [place]. A 26 French Gore dry seal sheath was then advanced over the wire and positioned in the right common iliac vein. The right internal jugular vein was interrogated with ultrasound and found to be widely patent. An image was obtained and stored for the medical record. Local anesthesia was attained by infiltration with 1% lidocaine. A small dermatotomy was made. Under real-time sonographic guidance, the vessel was punctured with a 21 gauge micropuncture needle. Using standard technique, the initial micro needle was exchanged over a 0.018 micro wire for a transitional 4 [place] micro sheath. The micro sheath was then exchanged over a 0.035 wire for a 6 French vascular sheath. A superstiff Amplatz wire was advanced into the suprarenal IVC. The skin tract was then serially dilated and an 59 French reperfusion catheter was advanced and position with the tip at the cavoatrial junction. At this time, the patient was fully heparinized until the ACT level was greater than 300 and the AngioVac circuit was established. The angio [facility] device was advanced through the 17 French sheath from the right common femoral approach and suction thrombectomy was performed throughout the infrarenal inferior vena cava. Working from the popliteal access, additional left lower extremity venography images were performed focusing on the iliac and common femoral vasculature. Thrombus is present within the common femoral vein and the external iliac vein. The  left common iliac vein is completely occluded similar to the other side. Attempts were made to pass through the occluded left common iliac vein from below. This was unsuccessful. Therefore, a second right internal jugular venous access was required. The right internal jugular vein was was again interrogated with ultrasound and found to be widely patent. An image was obtained and stored for the medical record. Local anesthesia was attained by infiltration with 1% lidocaine. A small dermatotomy was made. Under real-time sonographic guidance, the vessel was punctured with a 21 gauge micropuncture needle. Using standard technique, the initial micro needle was exchanged over a 0.018 micro wire for a transitional 4 [place] micro sheath. The micro sheath was then exchanged over a 0.035 wire for a 6 French vascular sheath. An angled catheter and glidewire were advanced through the sheath, through the IVC filter, through the infrarenal IVC and the catheter successfully navigated across the chronically occluded left common iliac vein. The glidewire was then exchanged for an exchange length Glidewire and snared from the femoral vein in the midthigh and brought through the popliteal sheath access resulting in successful through and through access from the right internal jugular vein through the  left popliteal vein. A catheter was then advanced over the wire into the femoral vein and the glidewire exchanged for a superstiff Amplatz wire. At this time, another cavagram was performed demonstrating successful debulking of the caval thrombus. At this point it was decided to proceed with removal of the IVC filter. Over the superstiff Amplatz wire from the right jugular vein approach, the skin tract was dilated and a second 26 [place] Gore dry seal sheath was advanced and positioned in the suprarenal IVC. A Bard IVC filter retrieval set was then advanced coaxially through this device next to the Amplatz wire. The Bard [place] IVC filter  was successfully retrieved using standard technique. Additional thrombectomy was then performed using the angio VAC device. This yielded significant material. Repeat vena cavagram demonstrates complete removal of thrombus from the inferior vena cava. Attention was next turned to the occluded left common iliac artery. The perfusion circuit was reversed and the reperfusion catheter was placed in the right femoral sheath and the angio back device was advanced through the right IJ sheath. Suction thrombectomy was performed in the right common iliac vein. Working from the popliteal access, angioplasty was performed of the common femoral, external iliac and common iliac veins and thrombus was pushed into the AngioVac. There appears to be a significant stenosis at the transition from the common iliac to the external iliac vein. This was resistant to initial angioplasty, so a 14 x 60 mm wall stent was selected, placed across the lesion and deployed. The wall stent was then post dilated to 12 mm using the 12 by 40 Conquest balloon. Catheter venography demonstrates significant near occlusive thrombus throughout the common femoral vein, and femoral vein in the thigh. Therefore, the decision was made to proceed with additional thrombectomy from the popliteal access using the [facility]. The 6 French left popliteal sheath was exchanged over the wire for a 9 French sheath. The is a long take catheter was advanced over the wire and real lytic thrombectomy performed throughout the femoral, common femoral and iliac veins. Follow-up angiography demonstrates significant improvement with restoration of in-line flow. There is a significant residual stenosis at the common femoral vein. This was angioplastied to 12 mm using the 12 x 40 mm Conquest balloon. On the final angiographic runs, there is successful restoration of in-line flow from the popliteal access through the IVC as well as successful recanalization of  both iliac veins. All wires and devices were removed. Once the patient's ACT drop below 200, the sheaths were removed. Hemostasis was attained with a combination of manual pressure and pursestring sutures at both 26 [place] sheath sites. 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 Significant stenosis at the transition from the left common to the left external iliac vein 3. 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 Successful recanalization and angioplasty of the chronically occluded right common iliac vein 2. Successful thrombolysis/thrombectomy of the inferior vena cava, left-sided iliac veins and left lower extremity deep venous system to the level of the popliteal vein. 3. Stent assisted angioplasty of a flow limiting stenosis at the interface of the left common and external iliac veins. 4. Angioplasty of a stenotic segment of the left common femoral vein. 5. The IVC filter was retrieved.  No additional filter was placed. PLAN: 1. Continue full-dose heparin and transitioned to weight based Lovenox prior to discharge. 2. Progress toward ambulation as soon as clinically possible. 3. Compression garment or Ace wraps to the  left lower extremity if needed to facilitate edema reduction. 4. Follow-up with Dr. Laurence Ferrari in interventional radiology clinic in 2 weeks. 5. If patient demonstrates any evidence of bleeding and needs to come off of his anticoagulation in the future, he must have an IVC filter replaced. Signed, Criselda Peaches, MD [facility] Radiology Electronically Signed   By: Jacqulynn Cadet M.D.   On: 01/07/2018 10:46   Ir Thrombect Veno Mech Mod Sed  Result Date: 01/07/2018 INDICATION: 57 year-old male with metastatic colon cancer an acute severe iliocaval and left lower extremity DVT. He presents for thrombectomy. EXAM: 1. Ultrasound-guided vascular access left popliteal vein 2. Ultrasound-guided access  right common femoral vein 3. Ultrasound-guided access right internal jugular vein 4. Second ultrasound-guided access right internal jugular vein 5. Left lower extremity venogram 6. Right lower extremity venogram 7. Inferior vena cavogram 8. Combined pharmacomechanical thrombolysis/thrombectomy using tPA, the AngioVac device, and the Angiojet device 9. IVC filter retrieval 10. Recanalization and balloon angioplasty of the right common iliac vein 11. Recanalization and angioplasty with subsequent stent placement in the left iliac vein 12. Balloon angioplasty of the left common femoral and femoral veins Operating Physicians: Corrie Mckusick, DO and Jacqulynn Cadet, MD COMPARISON:  CT scan 12/30/2017 MEDICATIONS: 12 mg tPA administered directly into thrombus throughout the course of the procedure ANESTHESIA/SEDATION: General anesthesia provided by the anesthesiology service FLUOROSCOPY TIME:  Fluoroscopy Time: 67 minutes 6 seconds (816.6 mGy). COMPLICATIONS: None immediate. TECHNIQUE: Informed written consent was obtained from the patient after a thorough discussion of the procedural risks, benefits and alternatives. All questions were addressed. Maximal Sterile Barrier Technique was utilized including caps, mask, sterile gowns, sterile gloves, sterile drape, hand hygiene and skin antiseptic. A timeout was performed prior to the initiation of the procedure. The patient was initially positioned prone. The left popliteal vein was interrogated with ultrasound and found to be widely patent. An image was obtained and stored for the medical record. Local anesthesia was attained by infiltration with 1% lidocaine. A small dermatotomy was made. Under real-time sonographic guidance, the vessel was punctured with a 21 gauge micropuncture needle. Using standard technique, the initial micro needle was exchanged over a 0.018 micro wire for a transitional 4 [place] micro sheath. The micro sheath was then exchanged over a 0.035 wire for a  45 cm 6 [place] braided Arrow sheath. A left lower extremity venogram was performed. The femoral vein is completely occluded in the thigh. The popliteal vein remains patent. 6 mg tPA was laced throughout the thrombus within the common and femoral veins. The patient was repositioned supine and intubated. The right common femoral was interrogated with ultrasound and found to be patent with changes of chronic DVT. An image was obtained and stored for the medical record. Local anesthesia was attained by infiltration with 1% lidocaine. A small dermatotomy was made. Under real-time sonographic guidance, the vessel was punctured with a 21 gauge micropuncture needle. Using standard technique, the initial micro needle was exchanged over a 0.018 micro wire for a transitional 4 [place] micro sheath. The micro sheath was then exchanged over a 0.035 wire for a 6 French vascular sheath. Right lower extremity venography was performed. The common femoral and external iliac veins are patent. 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 All venous drainage is via parallel spinous collaterals. An angled catheter and glidewire were introduced through the sheath. With some manipulation, the catheter was successfully advanced into the suprarenal IVC. An inferior vena cavagram was performed  confirming patency of both renal veins and the super renal IVC. A superstiff Amplatz wire was advanced into the right heart. A 10 x 40 mm Conquest balloon was introduced over the wire the occluded segment of the common iliac vein was angioplastied. Repeat venography demonstrates recreation of a small vascular channel. A 10 x 40 mm Conquest balloon was then selected and introduced over the wire. Venography demonstrates successful recanalization of the common iliac vein. There is flow through the IVC. 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 The 6 French sheath was exchanged in the soft tissue tract  dilated to 24 [place]. A 26 French Gore dry seal sheath was then advanced over the wire and positioned in the right common iliac vein. The right internal jugular vein was interrogated with ultrasound and found to be widely patent. An image was obtained and stored for the medical record. Local anesthesia was attained by infiltration with 1% lidocaine. A small dermatotomy was made. Under real-time sonographic guidance, the vessel was punctured with a 21 gauge micropuncture needle. Using standard technique, the initial micro needle was exchanged over a 0.018 micro wire for a transitional 4 [place] micro sheath. The micro sheath was then exchanged over a 0.035 wire for a 6 French vascular sheath. A superstiff Amplatz wire was advanced into the suprarenal IVC. The skin tract was then serially dilated and an 4 French reperfusion catheter was advanced and position with the tip at the cavoatrial junction. At this time, the patient was fully heparinized until the ACT level was greater than 300 and the AngioVac circuit was established. The angio [facility] device was advanced through the 41 French sheath from the right common femoral approach and suction thrombectomy was performed throughout the infrarenal inferior vena cava. Working from the popliteal access, additional left lower extremity venography images were performed focusing on the iliac and common femoral vasculature. Thrombus is present within the common femoral vein and the external iliac vein. The left common iliac vein is completely occluded similar to the other side. Attempts were made to pass through the occluded left common iliac vein from below. This was unsuccessful. Therefore, a second right internal jugular venous access was required. The right internal jugular vein was was again interrogated with ultrasound and found to be widely patent. An image was obtained and stored for the medical record. Local anesthesia was attained by infiltration with 1% lidocaine. A  small dermatotomy was made. Under real-time sonographic guidance, the vessel was punctured with a 21 gauge micropuncture needle. Using standard technique, the initial micro needle was exchanged over a 0.018 micro wire for a transitional 4 [place] micro sheath. The micro sheath was then exchanged over a 0.035 wire for a 6 French vascular sheath. An angled catheter and glidewire were advanced through the sheath, through the IVC filter, through the infrarenal IVC and the catheter successfully navigated across the chronically occluded left common iliac vein. The glidewire was then exchanged for an exchange length Glidewire and snared from the femoral vein in the midthigh and brought through the popliteal sheath access resulting in successful through and through access from the right internal jugular vein through the left popliteal vein. A catheter was then advanced over the wire into the femoral vein and the glidewire exchanged for a superstiff Amplatz wire. At this time, another cavagram was performed demonstrating successful debulking of the caval thrombus. At this point it was decided to proceed with removal of the IVC filter. Over the superstiff  Amplatz wire from the right jugular vein approach, the skin tract was dilated and a second 26 [place] Gore dry seal sheath was advanced and positioned in the suprarenal IVC. A Bard IVC filter retrieval set was then advanced coaxially through this device next to the Amplatz wire. The Bard [place] IVC filter was successfully retrieved using standard technique. Additional thrombectomy was then performed using the angio VAC device. This yielded significant material. Repeat vena cavagram demonstrates complete removal of thrombus from the inferior vena cava. Attention was next turned to the occluded left common iliac artery. The perfusion circuit was reversed and the reperfusion catheter was placed in the right femoral sheath and the angio back device was advanced through the right  IJ sheath. Suction thrombectomy was performed in the right common iliac vein. Working from the popliteal access, angioplasty was performed of the common femoral, external iliac and common iliac veins and thrombus was pushed into the AngioVac. There appears to be a significant stenosis at the transition from the common iliac to the external iliac vein. This was resistant to initial angioplasty, so a 14 x 60 mm wall stent was selected, placed across the lesion and deployed. The wall stent was then post dilated to 12 mm using the 12 by 40 Conquest balloon. Catheter venography demonstrates significant near occlusive thrombus throughout the common femoral vein, and femoral vein in the thigh. Therefore, the decision was made to proceed with additional thrombectomy from the popliteal access using the [facility]. The 6 French left popliteal sheath was exchanged over the wire for a 9 French sheath. The is a long take catheter was advanced over the wire and real lytic thrombectomy performed throughout the femoral, common femoral and iliac veins. Follow-up angiography demonstrates significant improvement with restoration of in-line flow. There is a significant residual stenosis at the common femoral vein. This was angioplastied to 12 mm using the 12 x 40 mm Conquest balloon. On the final angiographic runs, there is successful restoration of in-line flow from the popliteal access through the IVC as well as successful recanalization of both iliac veins. All wires and devices were removed. Once the patient's ACT drop below 200, the sheaths were removed. Hemostasis was attained with a combination of manual pressure and pursestring sutures at both 26 [place] sheath sites. 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 Significant stenosis at the transition from the left common to the left external iliac vein 3. 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 Successful  recanalization and angioplasty of the chronically occluded right common iliac vein 2. Successful thrombolysis/thrombectomy of the inferior vena cava, left-sided iliac veins and left lower extremity deep venous system to the level of the popliteal vein. 3. Stent assisted angioplasty of a flow limiting stenosis at the interface of the left common and external iliac veins. 4. Angioplasty of a stenotic segment of the left common femoral vein. 5. The IVC filter was retrieved.  No additional filter was placed. PLAN: 1. Continue full-dose heparin and transitioned to weight based Lovenox prior to discharge. 2. Progress toward ambulation as soon as clinically possible. 3. Compression garment or Ace wraps to the left lower extremity if needed to facilitate edema reduction. 4. Follow-up with Dr. Laurence Ferrari in interventional radiology clinic in 2 weeks. 5. If patient demonstrates any evidence of bleeding and needs to come off of his anticoagulation in the future, he must have an IVC filter replaced. Signed, Criselda Peaches, MD [facility] Radiology Electronically Signed   By: Jacqulynn Cadet M.D.   On: 01/07/2018 10:46   Ir US Guide Vasc Access Left  Result Date: 01/07/2018 INDICATION: 57 year-old male with metastatic colon cancer an acute severe iliocaval and left lower extremity DVT. He presents for thrombectomy. EXAM: 1. Ultrasound-guided vascular access left popliteal vein 2. Ultrasound-guided access right common femoral vein 3. Ultrasound-guided access right internal jugular vein 4. Second ultrasound-guided access right internal jugular vein 5. Left lower extremity venogram 6. Right lower extremity venogram 7. Inferior vena cavogram 8. Combined pharmacomechanical thrombolysis/thrombectomy using tPA, the AngioVac device, and the Angiojet device 9. IVC filter retrieval 10. Recanalization and balloon angioplasty of the right common iliac vein 11. Recanalization and  angioplasty with subsequent stent placement in the left iliac vein 12. Balloon angioplasty of the left common femoral and femoral veins Operating Physicians: Corrie Mckusick, DO and Jacqulynn Cadet, MD COMPARISON:  CT scan 12/30/2017 MEDICATIONS: 12 mg tPA administered directly into thrombus throughout the course of the procedure ANESTHESIA/SEDATION: General anesthesia provided by the anesthesiology service FLUOROSCOPY TIME:  Fluoroscopy Time: 67 minutes 6 seconds (816.6 mGy). COMPLICATIONS: None immediate. TECHNIQUE: Informed written consent was obtained from the patient after a thorough discussion of the procedural risks, benefits and alternatives. All questions were addressed. Maximal Sterile Barrier Technique was utilized including caps, mask, sterile gowns, sterile gloves, sterile drape, hand hygiene and skin antiseptic. A timeout was performed prior to the initiation of the procedure. The patient was initially positioned prone. The left popliteal vein was interrogated with ultrasound and found to be widely patent. An image was obtained and stored for the medical record. Local anesthesia was attained by infiltration with 1% lidocaine. A small dermatotomy was made. Under real-time sonographic guidance, the vessel was punctured with a 21 gauge micropuncture needle. Using standard technique, the initial micro needle was exchanged over a 0.018 micro wire for a transitional 4 [place] micro sheath. The micro sheath was then exchanged over a 0.035 wire for a 45 cm 6 [place] braided Arrow sheath. A left lower extremity venogram was performed. The femoral vein is completely occluded in the thigh. The popliteal vein remains patent. 6 mg tPA was laced throughout the thrombus within the common and femoral veins. The patient was repositioned supine and intubated. The right common femoral was interrogated with ultrasound and found to be patent with changes of chronic DVT. An image was obtained and stored for the medical record.  Local anesthesia was attained by infiltration with 1% lidocaine. A small dermatotomy was made. Under real-time sonographic guidance, the vessel was punctured with a 21 gauge micropuncture needle. Using standard technique, the initial micro needle was exchanged over a 0.018 micro wire for a transitional 4 [place] micro sheath. The micro sheath was then exchanged over a 0.035 wire for a 6 French vascular sheath. Right lower extremity venography was performed. The common femoral and external iliac veins are patent. 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 All venous drainage is via parallel spinous collaterals. An angled catheter and glidewire were introduced through the sheath. With some manipulation, the catheter was successfully advanced into the suprarenal IVC. An inferior vena cavagram was performed confirming patency of both renal veins and the super renal IVC. A superstiff Amplatz wire was advanced into the right heart. A 10 x 40 mm Conquest balloon was introduced over the wire the occluded segment of the common iliac vein was angioplastied. Repeat venography demonstrates recreation of a small vascular channel. A 10 x  40 mm Conquest balloon was then selected and introduced over the wire. Venography demonstrates successful recanalization of the common iliac vein. There is flow through the IVC. 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 The 6 French sheath was exchanged in the soft tissue tract dilated to 24 [place]. A 26 [place] Gore dry seal sheath was then advanced over the wire and positioned in the right common iliac vein. The right internal jugular vein was interrogated with ultrasound and found to be widely patent. An image was obtained and stored for the medical record. Local anesthesia was attained by infiltration with 1% lidocaine. A small dermatotomy was made. Under real-time sonographic guidance, the vessel was punctured with a 21 gauge  micropuncture needle. Using standard technique, the initial micro needle was exchanged over a 0.018 micro wire for a transitional 4 [place] micro sheath. The micro sheath was then exchanged over a 0.035 wire for a 6 French vascular sheath. A superstiff Amplatz wire was advanced into the suprarenal IVC. The skin tract was then serially dilated and an 45 French reperfusion catheter was advanced and position with the tip at the cavoatrial junction. At this time, the patient was fully heparinized until the ACT level was greater than 300 and the AngioVac circuit was established. The angio [facility] device was advanced through the 22 French sheath from the right common femoral approach and suction thrombectomy was performed throughout the infrarenal inferior vena cava. Working from the popliteal access, additional left lower extremity venography images were performed focusing on the iliac and common femoral vasculature. Thrombus is present within the common femoral vein and the external iliac vein. The left common iliac vein is completely occluded similar to the other side. Attempts were made to pass through the occluded left common iliac vein from below. This was unsuccessful. Therefore, a second right internal jugular venous access was required. The right internal jugular vein was was again interrogated with ultrasound and found to be widely patent. An image was obtained and stored for the medical record. Local anesthesia was attained by infiltration with 1% lidocaine. A small dermatotomy was made. Under real-time sonographic guidance, the vessel was punctured with a 21 gauge micropuncture needle. Using standard technique, the initial micro needle was exchanged over a 0.018 micro wire for a transitional 4 [place] micro sheath. The micro sheath was then exchanged over a 0.035 wire for a 6 French vascular sheath. An angled catheter and glidewire were advanced through the sheath, through the IVC filter, through the infrarenal IVC  and the catheter successfully navigated across the chronically occluded left common iliac vein. The glidewire was then exchanged for an exchange length Glidewire and snared from the femoral vein in the midthigh and brought through the popliteal sheath access resulting in successful through and through access from the right internal jugular vein through the left popliteal vein. A catheter was then advanced over the wire into the femoral vein and the glidewire exchanged for a superstiff Amplatz wire. At this time, another cavagram was performed demonstrating successful debulking of the caval thrombus. At this point it was decided to proceed with removal of the IVC filter. Over the superstiff Amplatz wire from the right jugular vein approach, the skin tract was dilated and a second 26 [place] Gore dry seal sheath was advanced and positioned in the suprarenal IVC. A Bard IVC filter retrieval set was then advanced coaxially through this device next to the Amplatz wire. The Bard [place] IVC filter was successfully  retrieved using standard technique. Additional thrombectomy was then performed using the angio VAC device. This yielded significant material. Repeat vena cavagram demonstrates complete removal of thrombus from the inferior vena cava. Attention was next turned to the occluded left common iliac artery. The perfusion circuit was reversed and the reperfusion catheter was placed in the right femoral sheath and the angio back device was advanced through the right IJ sheath. Suction thrombectomy was performed in the right common iliac vein. Working from the popliteal access, angioplasty was performed of the common femoral, external iliac and common iliac veins and thrombus was pushed into the AngioVac. There appears to be a significant stenosis at the transition from the common iliac to the external iliac vein. This was resistant to initial angioplasty, so a 14 x 60 mm wall stent was selected, placed across the lesion  and deployed. The wall stent was then post dilated to 12 mm using the 12 by 40 Conquest balloon. Catheter venography demonstrates significant near occlusive thrombus throughout the common femoral vein, and femoral vein in the thigh. Therefore, the decision was made to proceed with additional thrombectomy from the popliteal access using the [facility]. The 6 French left popliteal sheath was exchanged over the wire for a 9 French sheath. The is a long take catheter was advanced over the wire and real lytic thrombectomy performed throughout the femoral, common femoral and iliac veins. Follow-up angiography demonstrates significant improvement with restoration of in-line flow. There is a significant residual stenosis at the common femoral vein. This was angioplastied to 12 mm using the 12 x 40 mm Conquest balloon. On the final angiographic runs, there is successful restoration of in-line flow from the popliteal access through the IVC as well as successful recanalization of both iliac veins. All wires and devices were removed. Once the patient's ACT drop below 200, the sheaths were removed. Hemostasis was attained with a combination of manual pressure and pursestring sutures at both 26 [place] sheath sites. 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 Significant stenosis at the transition from the left common to the left external iliac vein 3. 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 Successful recanalization and angioplasty of the chronically occluded right common iliac vein 2. Successful thrombolysis/thrombectomy of the inferior vena cava, left-sided iliac veins and left lower extremity deep venous system to the level of the popliteal vein. 3. Stent assisted angioplasty of a flow limiting stenosis at the interface of the left common and external iliac veins. 4. Angioplasty of a stenotic segment of the left common femoral vein. 5. The IVC filter was  retrieved.  No additional filter was placed. PLAN: 1. Continue full-dose heparin and transitioned to weight based Lovenox prior to discharge. 2. Progress toward ambulation as soon as clinically possible. 3. Compression garment or Ace wraps to the left lower extremity if needed to facilitate edema reduction. 4. Follow-up with Dr. Laurence Ferrari in interventional radiology clinic in 2 weeks. 5. If patient demonstrates any evidence of bleeding and needs to come off of his anticoagulation in the future, he must have an IVC filter replaced. Signed, Criselda Peaches, MD [facility] Radiology Electronically Signed   By: Jacqulynn Cadet M.D.   On: 01/07/2018 10:46   Ir US Guide Vasc Access Right  Result Date: 01/07/2018 INDICATION: 57 year-old male with metastatic colon cancer an acute severe iliocaval and left lower extremity DVT. He presents for thrombectomy. EXAM: 1. Ultrasound-guided vascular  access left popliteal vein 2. Ultrasound-guided access right common femoral vein 3. Ultrasound-guided access right internal jugular vein 4. Second ultrasound-guided access right internal jugular vein 5. Left lower extremity venogram 6. Right lower extremity venogram 7. Inferior vena cavogram 8. Combined pharmacomechanical thrombolysis/thrombectomy using tPA, the AngioVac device, and the Angiojet device 9. IVC filter retrieval 10. Recanalization and balloon angioplasty of the right common iliac vein 11. Recanalization and angioplasty with subsequent stent placement in the left iliac vein 12. Balloon angioplasty of the left common femoral and femoral veins Operating Physicians: Corrie Mckusick, DO and Jacqulynn Cadet, MD COMPARISON:  CT scan 12/30/2017 MEDICATIONS: 12 mg tPA administered directly into thrombus throughout the course of the procedure ANESTHESIA/SEDATION: General anesthesia provided by the anesthesiology service FLUOROSCOPY TIME:  Fluoroscopy Time: 67 minutes 6 seconds  (816.6 mGy). COMPLICATIONS: None immediate. TECHNIQUE: Informed written consent was obtained from the patient after a thorough discussion of the procedural risks, benefits and alternatives. All questions were addressed. Maximal Sterile Barrier Technique was utilized including caps, mask, sterile gowns, sterile gloves, sterile drape, hand hygiene and skin antiseptic. A timeout was performed prior to the initiation of the procedure. The patient was initially positioned prone. The left popliteal vein was interrogated with ultrasound and found to be widely patent. An image was obtained and stored for the medical record. Local anesthesia was attained by infiltration with 1% lidocaine. A small dermatotomy was made. Under real-time sonographic guidance, the vessel was punctured with a 21 gauge micropuncture needle. Using standard technique, the initial micro needle was exchanged over a 0.018 micro wire for a transitional 4 [place] micro sheath. The micro sheath was then exchanged over a 0.035 wire for a 45 cm 6 [place] braided Arrow sheath. A left lower extremity venogram was performed. The femoral vein is completely occluded in the thigh. The popliteal vein remains patent. 6 mg tPA was laced throughout the thrombus within the common and femoral veins. The patient was repositioned supine and intubated. The right common femoral was interrogated with ultrasound and found to be patent with changes of chronic DVT. An image was obtained and stored for the medical record. Local anesthesia was attained by infiltration with 1% lidocaine. A small dermatotomy was made. Under real-time sonographic guidance, the vessel was punctured with a 21 gauge micropuncture needle. Using standard technique, the initial micro needle was exchanged over a 0.018 micro wire for a transitional 4 [place] micro sheath. The micro sheath was then exchanged over a 0.035 wire for a 6 French vascular sheath. Right lower extremity venography was performed. The  common femoral and external iliac veins are patent. 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 All venous drainage is via parallel spinous collaterals. An angled catheter and glidewire were introduced through the sheath. With some manipulation, the catheter was successfully advanced into the suprarenal IVC. An inferior vena cavagram was performed confirming patency of both renal veins and the super renal IVC. A superstiff Amplatz wire was advanced into the right heart. A 10 x 40 mm Conquest balloon was introduced over the wire the occluded segment of the common iliac vein was angioplastied. Repeat venography demonstrates recreation of a small vascular channel. A 10 x 40 mm Conquest balloon was then selected and introduced over the wire. Venography demonstrates successful recanalization of the common iliac vein. There is flow through the IVC. 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 The 6 French sheath was exchanged in the soft tissue tract dilated to 24 [place].  A 26 [place] Gore dry seal sheath was then advanced over the wire and positioned in the right common iliac vein. The right internal jugular vein was interrogated with ultrasound and found to be widely patent. An image was obtained and stored for the medical record. Local anesthesia was attained by infiltration with 1% lidocaine. A small dermatotomy was made. Under real-time sonographic guidance, the vessel was punctured with a 21 gauge micropuncture needle. Using standard technique, the initial micro needle was exchanged over a 0.018 micro wire for a transitional 4 [place] micro sheath. The micro sheath was then exchanged over a 0.035 wire for a 6 French vascular sheath. A superstiff Amplatz wire was advanced into the suprarenal IVC. The skin tract was then serially dilated and an 3 French reperfusion catheter was advanced and position with the tip at the cavoatrial junction. At this time, the patient  was fully heparinized until the ACT level was greater than 300 and the AngioVac circuit was established. The angio [facility] device was advanced through the 21 French sheath from the right common femoral approach and suction thrombectomy was performed throughout the infrarenal inferior vena cava. Working from the popliteal access, additional left lower extremity venography images were performed focusing on the iliac and common femoral vasculature. Thrombus is present within the common femoral vein and the external iliac vein. The left common iliac vein is completely occluded similar to the other side. Attempts were made to pass through the occluded left common iliac vein from below. This was unsuccessful. Therefore, a second right internal jugular venous access was required. The right internal jugular vein was was again interrogated with ultrasound and found to be widely patent. An image was obtained and stored for the medical record. Local anesthesia was attained by infiltration with 1% lidocaine. A small dermatotomy was made. Under real-time sonographic guidance, the vessel was punctured with a 21 gauge micropuncture needle. Using standard technique, the initial micro needle was exchanged over a 0.018 micro wire for a transitional 4 [place] micro sheath. The micro sheath was then exchanged over a 0.035 wire for a 6 French vascular sheath. An angled catheter and glidewire were advanced through the sheath, through the IVC filter, through the infrarenal IVC and the catheter successfully navigated across the chronically occluded left common iliac vein. The glidewire was then exchanged for an exchange length Glidewire and snared from the femoral vein in the midthigh and brought through the popliteal sheath access resulting in successful through and through access from the right internal jugular vein through the left popliteal vein. A catheter was then advanced over the wire into the femoral vein and the glidewire exchanged  for a superstiff Amplatz wire. At this time, another cavagram was performed demonstrating successful debulking of the caval thrombus. At this point it was decided to proceed with removal of the IVC filter. Over the superstiff Amplatz wire from the right jugular vein approach, the skin tract was dilated and a second 26 [place] Gore dry seal sheath was advanced and positioned in the suprarenal IVC. A Bard IVC filter retrieval set was then advanced coaxially through this device next to the Amplatz wire. The Bard [place] IVC filter was successfully retrieved using standard technique. Additional thrombectomy was then performed using the angio VAC device. This yielded significant material. Repeat vena cavagram demonstrates complete removal of thrombus from the inferior vena cava. Attention was next turned to the occluded left common iliac artery. The perfusion circuit was reversed and the reperfusion catheter was placed in the  right femoral sheath and the angio back device was advanced through the right IJ sheath. Suction thrombectomy was performed in the right common iliac vein. Working from the popliteal access, angioplasty was performed of the common femoral, external iliac and common iliac veins and thrombus was pushed into the AngioVac. There appears to be a significant stenosis at the transition from the common iliac to the external iliac vein. This was resistant to initial angioplasty, so a 14 x 60 mm wall stent was selected, placed across the lesion and deployed. The wall stent was then post dilated to 12 mm using the 12 by 40 Conquest balloon. Catheter venography demonstrates significant near occlusive thrombus throughout the common femoral vein, and femoral vein in the thigh. Therefore, the decision was made to proceed with additional thrombectomy from the popliteal access using the [facility]. The 6 French left popliteal sheath was exchanged over the wire for a 9 French sheath. The is a  long take catheter was advanced over the wire and real lytic thrombectomy performed throughout the femoral, common femoral and iliac veins. Follow-up angiography demonstrates significant improvement with restoration of in-line flow. There is a significant residual stenosis at the common femoral vein. This was angioplastied to 12 mm using the 12 x 40 mm Conquest balloon. On the final angiographic runs, there is successful restoration of in-line flow from the popliteal access through the IVC as well as successful recanalization of both iliac veins. All wires and devices were removed. Once the patient's ACT drop below 200, the sheaths were removed. Hemostasis was attained with a combination of manual pressure and pursestring sutures at both 26 [place] sheath sites. 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 Significant stenosis at the transition from the left common to the left external iliac vein 3. 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 Successful recanalization and angioplasty of the chronically occluded right common iliac vein 2. Successful thrombolysis/thrombectomy of the inferior vena cava, left-sided iliac veins and left lower extremity deep venous system to the level of the popliteal vein. 3. Stent assisted angioplasty of a flow limiting stenosis at the interface of the left common and external iliac veins. 4. Angioplasty of a stenotic segment of the left common femoral vein. 5. The IVC filter was retrieved.  No additional filter was placed. PLAN: 1. Continue full-dose heparin and transitioned to weight based Lovenox prior to discharge. 2. Progress toward ambulation as soon as clinically possible. 3. Compression garment or Ace wraps to the left lower extremity if needed to facilitate edema reduction. 4. Follow-up with Dr. Laurence Ferrari in interventional radiology clinic in 2 weeks. 5. If patient demonstrates any evidence of bleeding and needs to come off of his  anticoagulation in the future, he must have an IVC filter replaced. Signed, Criselda Peaches, MD [facility] Radiology Electronically Signed   By: Jacqulynn Cadet M.D.   On: 01/07/2018 10:46   Ir US Guide Vasc Access Right  Result Date: 01/07/2018 INDICATION: 57 year-old male with metastatic colon cancer an acute severe iliocaval and left lower extremity DVT. He presents for thrombectomy. EXAM: 1. Ultrasound-guided vascular access left popliteal vein 2. Ultrasound-guided access right common femoral vein 3. Ultrasound-guided access right internal jugular vein 4. Second ultrasound-guided access right internal jugular vein 5. Left lower extremity venogram 6. Right lower extremity venogram 7. Inferior vena cavogram 8. Combined pharmacomechanical thrombolysis/thrombectomy using tPA, the AngioVac device, and the Angiojet device 9. IVC  filter retrieval 10. Recanalization and balloon angioplasty of the right common iliac vein 11. Recanalization and angioplasty with subsequent stent placement in the left iliac vein 12. Balloon angioplasty of the left common femoral and femoral veins Operating Physicians: Corrie Mckusick, DO and Jacqulynn Cadet, MD COMPARISON:  CT scan 12/30/2017 MEDICATIONS: 12 mg tPA administered directly into thrombus throughout the course of the procedure ANESTHESIA/SEDATION: General anesthesia provided by the anesthesiology service FLUOROSCOPY TIME:  Fluoroscopy Time: 67 minutes 6 seconds (816.6 mGy). COMPLICATIONS: None immediate. TECHNIQUE: Informed written consent was obtained from the patient after a thorough discussion of the procedural risks, benefits and alternatives. All questions were addressed. Maximal Sterile Barrier Technique was utilized including caps, mask, sterile gowns, sterile gloves, sterile drape, hand hygiene and skin antiseptic. A timeout was performed prior to the initiation of the procedure. The patient was initially  positioned prone. The left popliteal vein was interrogated with ultrasound and found to be widely patent. An image was obtained and stored for the medical record. Local anesthesia was attained by infiltration with 1% lidocaine. A small dermatotomy was made. Under real-time sonographic guidance, the vessel was punctured with a 21 gauge micropuncture needle. Using standard technique, the initial micro needle was exchanged over a 0.018 micro wire for a transitional 4 [place] micro sheath. The micro sheath was then exchanged over a 0.035 wire for a 45 cm 6 [place] braided Arrow sheath. A left lower extremity venogram was performed. The femoral vein is completely occluded in the thigh. The popliteal vein remains patent. 6 mg tPA was laced throughout the thrombus within the common and femoral veins. The patient was repositioned supine and intubated. The right common femoral was interrogated with ultrasound and found to be patent with changes of chronic DVT. An image was obtained and stored for the medical record. Local anesthesia was attained by infiltration with 1% lidocaine. A small dermatotomy was made. Under real-time sonographic guidance, the vessel was punctured with a 21 gauge micropuncture needle. Using standard technique, the initial micro needle was exchanged over a 0.018 micro wire for a transitional 4 [place] micro sheath. The micro sheath was then exchanged over a 0.035 wire for a 6 French vascular sheath. Right lower extremity venography was performed. The common femoral and external iliac veins are patent. 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 All venous drainage is via parallel spinous collaterals. An angled catheter and glidewire were introduced through the sheath. With some manipulation, the catheter was successfully advanced into the suprarenal IVC. An inferior vena cavagram was performed confirming patency of both renal veins and the super renal IVC. A superstiff Amplatz wire was advanced into  the right heart. A 10 x 40 mm Conquest balloon was introduced over the wire the occluded segment of the common iliac vein was angioplastied. Repeat venography demonstrates recreation of a small vascular channel. A 10 x 40 mm Conquest balloon was then selected and introduced over the wire. Venography demonstrates successful recanalization of the common iliac vein. There is flow through the IVC. 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 The 6 French sheath was exchanged in the soft tissue tract dilated to 24 [place]. A 26 [place] Gore dry seal sheath was then advanced over the wire and positioned in the right common iliac vein. The right internal jugular vein was interrogated with ultrasound and found to be widely patent. An image was obtained and stored for the medical record. Local anesthesia was attained by infiltration with 1% lidocaine.  A small dermatotomy was made. Under real-time sonographic guidance, the vessel was punctured with a 21 gauge micropuncture needle. Using standard technique, the initial micro needle was exchanged over a 0.018 micro wire for a transitional 4 [place] micro sheath. The micro sheath was then exchanged over a 0.035 wire for a 6 French vascular sheath. A superstiff Amplatz wire was advanced into the suprarenal IVC. The skin tract was then serially dilated and an 60 French reperfusion catheter was advanced and position with the tip at the cavoatrial junction. At this time, the patient was fully heparinized until the ACT level was greater than 300 and the AngioVac circuit was established. The angio [facility] device was advanced through the 47 [place] sheath from the right common femoral approach and suction thrombectomy was performed throughout the infrarenal inferior vena cava. Working from the popliteal access, additional left lower extremity venography images were performed focusing on the iliac and common femoral vasculature. Thrombus is  present within the common femoral vein and the external iliac vein. The left common iliac vein is completely occluded similar to the other side. Attempts were made to pass through the occluded left common iliac vein from below. This was unsuccessful. Therefore, a second right internal jugular venous access was required. The right internal jugular vein was was again interrogated with ultrasound and found to be widely patent. An image was obtained and stored for the medical record. Local anesthesia was attained by infiltration with 1% lidocaine. A small dermatotomy was made. Under real-time sonographic guidance, the vessel was punctured with a 21 gauge micropuncture needle. Using standard technique, the initial micro needle was exchanged over a 0.018 micro wire for a transitional 4 [place] micro sheath. The micro sheath was then exchanged over a 0.035 wire for a 6 French vascular sheath. An angled catheter and glidewire were advanced through the sheath, through the IVC filter, through the infrarenal IVC and the catheter successfully navigated across the chronically occluded left common iliac vein. The glidewire was then exchanged for an exchange length Glidewire and snared from the femoral vein in the midthigh and brought through the popliteal sheath access resulting in successful through and through access from the right internal jugular vein through the left popliteal vein. A catheter was then advanced over the wire into the femoral vein and the glidewire exchanged for a superstiff Amplatz wire. At this time, another cavagram was performed demonstrating successful debulking of the caval thrombus. At this point it was decided to proceed with removal of the IVC filter. Over the superstiff Amplatz wire from the right jugular vein approach, the skin tract was dilated and a second 26 [place] Gore dry seal sheath was advanced and positioned in the suprarenal IVC. A Bard IVC filter retrieval set was then advanced coaxially  through this device next to the Amplatz wire. The Bard [place] IVC filter was successfully retrieved using standard technique. Additional thrombectomy was then performed using the angio VAC device. This yielded significant material. Repeat vena cavagram demonstrates complete removal of thrombus from the inferior vena cava. Attention was next turned to the occluded left common iliac artery. The perfusion circuit was reversed and the reperfusion catheter was placed in the right femoral sheath and the angio back device was advanced through the right IJ sheath. Suction thrombectomy was performed in the right common iliac vein. Working from the popliteal access, angioplasty was performed of the common femoral, external iliac and common iliac veins and thrombus was pushed into the AngioVac. There appears to be a  significant stenosis at the transition from the common iliac to the external iliac vein. This was resistant to initial angioplasty, so a 14 x 60 mm wall stent was selected, placed across the lesion and deployed. The wall stent was then post dilated to 12 mm using the 12 by 40 Conquest balloon. Catheter venography demonstrates significant near occlusive thrombus throughout the common femoral vein, and femoral vein in the thigh. Therefore, the decision was made to proceed with additional thrombectomy from the popliteal access using the [facility]. The 6 French left popliteal sheath was exchanged over the wire for a 9 French sheath. The is a long take catheter was advanced over the wire and real lytic thrombectomy performed throughout the femoral, common femoral and iliac veins. Follow-up angiography demonstrates significant improvement with restoration of in-line flow. There is a significant residual stenosis at the common femoral vein. This was angioplastied to 12 mm using the 12 x 40 mm Conquest balloon. On the final angiographic runs, there is successful restoration of in-line flow from the  popliteal access through the IVC as well as successful recanalization of both iliac veins. All wires and devices were removed. Once the patient's ACT drop below 200, the sheaths were removed. Hemostasis was attained with a combination of manual pressure and pursestring sutures at both 26 [place] sheath sites. 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 Significant stenosis at the transition from the left common to the left external iliac vein 3. 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 Successful recanalization and angioplasty of the chronically occluded right common iliac vein 2. Successful thrombolysis/thrombectomy of the inferior vena cava, left-sided iliac veins and left lower extremity deep venous system to the level of the popliteal vein. 3. Stent assisted angioplasty of a flow limiting stenosis at the interface of the left common and external iliac veins. 4. Angioplasty of a stenotic segment of the left common femoral vein. 5. The IVC filter was retrieved.  No additional filter was placed. PLAN: 1. Continue full-dose heparin and transitioned to weight based Lovenox prior to discharge. 2. Progress toward ambulation as soon as clinically possible. 3. Compression garment or Ace wraps to the left lower extremity if needed to facilitate edema reduction. 4. Follow-up with Dr. Laurence Ferrari in interventional radiology clinic in 2 weeks. 5. If patient demonstrates any evidence of bleeding and needs to come off of his anticoagulation in the future, he must have an IVC filter replaced. Signed, Criselda Peaches, MD [facility] Radiology Electronically Signed   By: Jacqulynn Cadet M.D.   On: 01/07/2018 10:46   Dg Chest Port 1 View  Result Date: 01/07/2018 CLINICAL DATA:  Respiratory failure EXAM: PORTABLE CHEST 1 VIEW COMPARISON:  None. FINDINGS: The heart size and mediastinal contours are within normal limits. Both lungs are  clear. Endotracheal tube tip is 4.3 cm above the carina in satisfactory position. Port catheter tip is noted in the distal SVC. No effusion or pneumothorax. The visualized skeletal structures are unremarkable. IMPRESSION: Satisfactory support line and tubes.  Clear lungs. Electronically Signed   By: Ashley Royalty M.D.   On: 01/07/2018 03:30   Ir Pta Venous Except Dialysis Circuit  Result Date: 01/07/2018 INDICATION: 57 year-old male with metastatic colon cancer an acute severe iliocaval and left lower extremity DVT. He presents for thrombectomy. EXAM: 1. Ultrasound-guided vascular access left popliteal vein 2. Ultrasound-guided access right common femoral vein 3. Ultrasound-guided access right internal jugular  vein 4. Second ultrasound-guided access right internal jugular vein 5. Left lower extremity venogram 6. Right lower extremity venogram 7. Inferior vena cavogram 8. Combined pharmacomechanical thrombolysis/thrombectomy using tPA, the AngioVac device, and the Angiojet device 9. IVC filter retrieval 10. Recanalization and balloon angioplasty of the right common iliac vein 11. Recanalization and angioplasty with subsequent stent placement in the left iliac vein 12. Balloon angioplasty of the left common femoral and femoral veins Operating Physicians: Corrie Mckusick, DO and Jacqulynn Cadet, MD COMPARISON:  CT scan 12/30/2017 MEDICATIONS: 12 mg tPA administered directly into thrombus throughout the course of the procedure ANESTHESIA/SEDATION: General anesthesia provided by the anesthesiology service FLUOROSCOPY TIME:  Fluoroscopy Time: 67 minutes 6 seconds (816.6 mGy). COMPLICATIONS: None immediate. TECHNIQUE: Informed written consent was obtained from the patient after a thorough discussion of the procedural risks, benefits and alternatives. All questions were addressed. Maximal Sterile Barrier Technique was utilized including caps, mask, sterile gowns, sterile gloves, sterile drape, hand hygiene and skin  antiseptic. A timeout was performed prior to the initiation of the procedure. The patient was initially positioned prone. The left popliteal vein was interrogated with ultrasound and found to be widely patent. An image was obtained and stored for the medical record. Local anesthesia was attained by infiltration with 1% lidocaine. A small dermatotomy was made. Under real-time sonographic guidance, the vessel was punctured with a 21 gauge micropuncture needle. Using standard technique, the initial micro needle was exchanged over a 0.018 micro wire for a transitional 4 [place] micro sheath. The micro sheath was then exchanged over a 0.035 wire for a 45 cm 6 [place] braided Arrow sheath. A left lower extremity venogram was performed. The femoral vein is completely occluded in the thigh. The popliteal vein remains patent. 6 mg tPA was laced throughout the thrombus within the common and femoral veins. The patient was repositioned supine and intubated. The right common femoral was interrogated with ultrasound and found to be patent with changes of chronic DVT. An image was obtained and stored for the medical record. Local anesthesia was attained by infiltration with 1% lidocaine. A small dermatotomy was made. Under real-time sonographic guidance, the vessel was punctured with a 21 gauge micropuncture needle. Using standard technique, the initial micro needle was exchanged over a 0.018 micro wire for a transitional 4 [place] micro sheath. The micro sheath was then exchanged over a 0.035 wire for a 6 French vascular sheath. Right lower extremity venography was performed. The common femoral and external iliac veins are patent. 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 All venous drainage is via parallel spinous collaterals. An angled catheter and glidewire were introduced through the sheath. With some manipulation, the catheter was successfully advanced into the suprarenal IVC. An inferior vena cavagram was performed  confirming patency of both renal veins and the super renal IVC. A superstiff Amplatz wire was advanced into the right heart. A 10 x 40 mm Conquest balloon was introduced over the wire the occluded segment of the common iliac vein was angioplastied. Repeat venography demonstrates recreation of a small vascular channel. A 10 x 40 mm Conquest balloon was then selected and introduced over the wire. Venography demonstrates successful recanalization of the common iliac vein. There is flow through the IVC. 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 The 6 French sheath was exchanged in the soft tissue tract dilated to 24 [place]. A 26 French Gore dry seal sheath was then advanced over the wire and positioned in the  right common iliac vein. The right internal jugular vein was interrogated with ultrasound and found to be widely patent. An image was obtained and stored for the medical record. Local anesthesia was attained by infiltration with 1% lidocaine. A small dermatotomy was made. Under real-time sonographic guidance, the vessel was punctured with a 21 gauge micropuncture needle. Using standard technique, the initial micro needle was exchanged over a 0.018 micro wire for a transitional 4 [place] micro sheath. The micro sheath was then exchanged over a 0.035 wire for a 6 French vascular sheath. A superstiff Amplatz wire was advanced into the suprarenal IVC. The skin tract was then serially dilated and an 59 French reperfusion catheter was advanced and position with the tip at the cavoatrial junction. At this time, the patient was fully heparinized until the ACT level was greater than 300 and the AngioVac circuit was established. The angio [facility] device was advanced through the 32 French sheath from the right common femoral approach and suction thrombectomy was performed throughout the infrarenal inferior vena cava. Working from the popliteal access, additional left lower  extremity venography images were performed focusing on the iliac and common femoral vasculature. Thrombus is present within the common femoral vein and the external iliac vein. The left common iliac vein is completely occluded similar to the other side. Attempts were made to pass through the occluded left common iliac vein from below. This was unsuccessful. Therefore, a second right internal jugular venous access was required. The right internal jugular vein was was again interrogated with ultrasound and found to be widely patent. An image was obtained and stored for the medical record. Local anesthesia was attained by infiltration with 1% lidocaine. A small dermatotomy was made. Under real-time sonographic guidance, the vessel was punctured with a 21 gauge micropuncture needle. Using standard technique, the initial micro needle was exchanged over a 0.018 micro wire for a transitional 4 [place] micro sheath. The micro sheath was then exchanged over a 0.035 wire for a 6 French vascular sheath. An angled catheter and glidewire were advanced through the sheath, through the IVC filter, through the infrarenal IVC and the catheter successfully navigated across the chronically occluded left common iliac vein. The glidewire was then exchanged for an exchange length Glidewire and snared from the femoral vein in the midthigh and brought through the popliteal sheath access resulting in successful through and through access from the right internal jugular vein through the left popliteal vein. A catheter was then advanced over the wire into the femoral vein and the glidewire exchanged for a superstiff Amplatz wire. At this time, another cavagram was performed demonstrating successful debulking of the caval thrombus. At this point it was decided to proceed with removal of the IVC filter. Over the superstiff Amplatz wire from the right jugular vein approach, the skin tract was dilated and a second 26 [place] Gore dry seal sheath was  advanced and positioned in the suprarenal IVC. A Bard IVC filter retrieval set was then advanced coaxially through this device next to the Amplatz wire. The Bard [place] IVC filter was successfully retrieved using standard technique. Additional thrombectomy was then performed using the angio VAC device. This yielded significant material. Repeat vena cavagram demonstrates complete removal of thrombus from the inferior vena cava. Attention was next turned to the occluded left common iliac artery. The perfusion circuit was reversed and the reperfusion catheter was placed in the right femoral sheath and the angio back device was advanced through the right IJ sheath. Suction thrombectomy  was performed in the right common iliac vein. Working from the popliteal access, angioplasty was performed of the common femoral, external iliac and common iliac veins and thrombus was pushed into the AngioVac. There appears to be a significant stenosis at the transition from the common iliac to the external iliac vein. This was resistant to initial angioplasty, so a 14 x 60 mm wall stent was selected, placed across the lesion and deployed. The wall stent was then post dilated to 12 mm using the 12 by 40 Conquest balloon. Catheter venography demonstrates significant near occlusive thrombus throughout the common femoral vein, and femoral vein in the thigh. Therefore, the decision was made to proceed with additional thrombectomy from the popliteal access using the [facility]. The 6 French left popliteal sheath was exchanged over the wire for a 9 French sheath. The is a long take catheter was advanced over the wire and real lytic thrombectomy performed throughout the femoral, common femoral and iliac veins. Follow-up angiography demonstrates significant improvement with restoration of in-line flow. There is a significant residual stenosis at the common femoral vein. This was angioplastied to 12 mm using the 12 x 40 mm  Conquest balloon. On the final angiographic runs, there is successful restoration of in-line flow from the popliteal access through the IVC as well as successful recanalization of both iliac veins. All wires and devices were removed. Once the patient's ACT drop below 200, the sheaths were removed. Hemostasis was attained with a combination of manual pressure and pursestring sutures at both 26 [place] sheath sites. 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 Significant stenosis at the transition from the left common to the left external iliac vein 3. 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 Successful recanalization and angioplasty of the chronically occluded right common iliac vein 2. Successful thrombolysis/thrombectomy of the inferior vena cava, left-sided iliac veins and left lower extremity deep venous system to the level of the popliteal vein. 3. Stent assisted angioplasty of a flow limiting stenosis at the interface of the left common and external iliac veins. 4. Angioplasty of a stenotic segment of the left common femoral vein. 5. The IVC filter was retrieved.  No additional filter was placed. PLAN: 1. Continue full-dose heparin and transitioned to weight based Lovenox prior to discharge. 2. Progress toward ambulation as soon as clinically possible. 3. Compression garment or Ace wraps to the left lower extremity if needed to facilitate edema reduction. 4. Follow-up with Dr. Laurence Ferrari in interventional radiology clinic in 2 weeks. 5. If patient demonstrates any evidence of bleeding and needs to come off of his anticoagulation in the future, he must have an IVC filter replaced. Signed, Criselda Peaches, MD [facility] Radiology Electronically Signed   By: Jacqulynn Cadet M.D.   On: 01/07/2018 10:46   Ir Pta Venous Addl Except Dialysis Circuit  Result Date: 01/07/2018 INDICATION: 57 year-old male with metastatic  colon cancer an acute severe iliocaval and left lower extremity DVT. He presents for thrombectomy. EXAM: 1. Ultrasound-guided vascular access left popliteal vein 2. Ultrasound-guided access right common femoral vein 3. Ultrasound-guided access right internal jugular vein 4. Second ultrasound-guided access right internal jugular vein 5. Left lower extremity venogram 6. Right lower extremity venogram 7. Inferior vena cavogram 8. Combined pharmacomechanical thrombolysis/thrombectomy using tPA, the AngioVac device, and the Angiojet device 9. IVC filter retrieval 10. Recanalization and balloon angioplasty of the right common iliac vein 11. Recanalization and  angioplasty with subsequent stent placement in the left iliac vein 12. Balloon angioplasty of the left common femoral and femoral veins Operating Physicians: Corrie Mckusick, DO and Jacqulynn Cadet, MD COMPARISON:  CT scan 12/30/2017 MEDICATIONS: 12 mg tPA administered directly into thrombus throughout the course of the procedure ANESTHESIA/SEDATION: General anesthesia provided by the anesthesiology service FLUOROSCOPY TIME:  Fluoroscopy Time: 67 minutes 6 seconds (816.6 mGy). COMPLICATIONS: None immediate. TECHNIQUE: Informed written consent was obtained from the patient after a thorough discussion of the procedural risks, benefits and alternatives. All questions were addressed. Maximal Sterile Barrier Technique was utilized including caps, mask, sterile gowns, sterile gloves, sterile drape, hand hygiene and skin antiseptic. A timeout was performed prior to the initiation of the procedure. The patient was initially positioned prone. The left popliteal vein was interrogated with ultrasound and found to be widely patent. An image was obtained and stored for the medical record. Local anesthesia was attained by infiltration with 1% lidocaine. A small dermatotomy was made. Under real-time sonographic guidance, the vessel was punctured with a 21 gauge micropuncture needle.  Using standard technique, the initial micro needle was exchanged over a 0.018 micro wire for a transitional 4 [place] micro sheath. The micro sheath was then exchanged over a 0.035 wire for a 45 cm 6 [place] braided Arrow sheath. A left lower extremity venogram was performed. The femoral vein is completely occluded in the thigh. The popliteal vein remains patent. 6 mg tPA was laced throughout the thrombus within the common and femoral veins. The patient was repositioned supine and intubated. The right common femoral was interrogated with ultrasound and found to be patent with changes of chronic DVT. An image was obtained and stored for the medical record. Local anesthesia was attained by infiltration with 1% lidocaine. A small dermatotomy was made. Under real-time sonographic guidance, the vessel was punctured with a 21 gauge micropuncture needle. Using standard technique, the initial micro needle was exchanged over a 0.018 micro wire for a transitional 4 [place] micro sheath. The micro sheath was then exchanged over a 0.035 wire for a 6 French vascular sheath. Right lower extremity venography was performed. The common femoral and external iliac veins are patent. 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 All venous drainage is via parallel spinous collaterals. An angled catheter and glidewire were introduced through the sheath. With some manipulation, the catheter was successfully advanced into the suprarenal IVC. An inferior vena cavagram was performed confirming patency of both renal veins and the super renal IVC. A superstiff Amplatz wire was advanced into the right heart. A 10 x 40 mm Conquest balloon was introduced over the wire the occluded segment of the common iliac vein was angioplastied. Repeat venography demonstrates recreation of a small vascular channel. A 10 x 40 mm Conquest balloon was then selected and introduced over the wire. Venography demonstrates successful recanalization of the common iliac  vein. There is flow through the IVC. 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 The 6 French sheath was exchanged in the soft tissue tract dilated to 24 [place]. A 26 French Gore dry seal sheath was then advanced over the wire and positioned in the right common iliac vein. The right internal jugular vein was interrogated with ultrasound and found to be widely patent. An image was obtained and stored for the medical record. Local anesthesia was attained by infiltration with 1% lidocaine. A small dermatotomy was made. Under real-time sonographic guidance, the vessel was punctured with a 21  gauge micropuncture needle. Using standard technique, the initial micro needle was exchanged over a 0.018 micro wire for a transitional 4 [place] micro sheath. The micro sheath was then exchanged over a 0.035 wire for a 6 French vascular sheath. A superstiff Amplatz wire was advanced into the suprarenal IVC. The skin tract was then serially dilated and an 41 French reperfusion catheter was advanced and position with the tip at the cavoatrial junction. At this time, the patient was fully heparinized until the ACT level was greater than 300 and the AngioVac circuit was established. The angio [facility] device was advanced through the 13 French sheath from the right common femoral approach and suction thrombectomy was performed throughout the infrarenal inferior vena cava. Working from the popliteal access, additional left lower extremity venography images were performed focusing on the iliac and common femoral vasculature. Thrombus is present within the common femoral vein and the external iliac vein. The left common iliac vein is completely occluded similar to the other side. Attempts were made to pass through the occluded left common iliac vein from below. This was unsuccessful. Therefore, a second right internal jugular venous access was required. The right internal jugular vein was was  again interrogated with ultrasound and found to be widely patent. An image was obtained and stored for the medical record. Local anesthesia was attained by infiltration with 1% lidocaine. A small dermatotomy was made. Under real-time sonographic guidance, the vessel was punctured with a 21 gauge micropuncture needle. Using standard technique, the initial micro needle was exchanged over a 0.018 micro wire for a transitional 4 [place] micro sheath. The micro sheath was then exchanged over a 0.035 wire for a 6 French vascular sheath. An angled catheter and glidewire were advanced through the sheath, through the IVC filter, through the infrarenal IVC and the catheter successfully navigated across the chronically occluded left common iliac vein. The glidewire was then exchanged for an exchange length Glidewire and snared from the femoral vein in the midthigh and brought through the popliteal sheath access resulting in successful through and through access from the right internal jugular vein through the left popliteal vein. A catheter was then advanced over the wire into the femoral vein and the glidewire exchanged for a superstiff Amplatz wire. At this time, another cavagram was performed demonstrating successful debulking of the caval thrombus. At this point it was decided to proceed with removal of the IVC filter. Over the superstiff Amplatz wire from the right jugular vein approach, the skin tract was dilated and a second 26 [place] Gore dry seal sheath was advanced and positioned in the suprarenal IVC. A Bard IVC filter retrieval set was then advanced coaxially through this device next to the Amplatz wire. The Bard [place] IVC filter was successfully retrieved using standard technique. Additional thrombectomy was then performed using the angio VAC device. This yielded significant material. Repeat vena cavagram demonstrates complete removal of thrombus from the inferior vena cava. Attention was next turned to the  occluded left common iliac artery. The perfusion circuit was reversed and the reperfusion catheter was placed in the right femoral sheath and the angio back device was advanced through the right IJ sheath. Suction thrombectomy was performed in the right common iliac vein. Working from the popliteal access, angioplasty was performed of the common femoral, external iliac and common iliac veins and thrombus was pushed into the AngioVac. There appears to be a significant stenosis at the transition from the common iliac to the external iliac vein. This was  resistant to initial angioplasty, so a 14 x 60 mm wall stent was selected, placed across the lesion and deployed. The wall stent was then post dilated to 12 mm using the 12 by 40 Conquest balloon. Catheter venography demonstrates significant near occlusive thrombus throughout the common femoral vein, and femoral vein in the thigh. Therefore, the decision was made to proceed with additional thrombectomy from the popliteal access using the [facility]. The 6 French left popliteal sheath was exchanged over the wire for a 9 French sheath. The is a long take catheter was advanced over the wire and real lytic thrombectomy performed throughout the femoral, common femoral and iliac veins. Follow-up angiography demonstrates significant improvement with restoration of in-line flow. There is a significant residual stenosis at the common femoral vein. This was angioplastied to 12 mm using the 12 x 40 mm Conquest balloon. On the final angiographic runs, there is successful restoration of in-line flow from the popliteal access through the IVC as well as successful recanalization of both iliac veins. All wires and devices were removed. Once the patient's ACT drop below 200, the sheaths were removed. Hemostasis was attained with a combination of manual pressure and pursestring sutures at both 26 [place] sheath sites. FINDINGS: 1. Chronic occlusion of the right  common iliac vein 2. Significant stenosis at the transition from the left common to the left external iliac vein 3. 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 Successful recanalization and angioplasty of the chronically occluded right common iliac vein 2. Successful thrombolysis/thrombectomy of the inferior vena cava, left-sided iliac veins and left lower extremity deep venous system to the level of the popliteal vein. 3. Stent assisted angioplasty of a flow limiting stenosis at the interface of the left common and external iliac veins. 4. Angioplasty of a stenotic segment of the left common femoral vein. 5. The IVC filter was retrieved.  No additional filter was placed. PLAN: 1. Continue full-dose heparin and transitioned to weight based Lovenox prior to discharge. 2. Progress toward ambulation as soon as clinically possible. 3. Compression garment or Ace wraps to the left lower extremity if needed to facilitate edema reduction. 4. Follow-up with Dr. Laurence Ferrari in interventional radiology clinic in 2 weeks. 5. If patient demonstrates any evidence of bleeding and needs to come off of his anticoagulation in the future, he must have an IVC filter replaced. Signed, Criselda Peaches, MD [facility] Radiology Electronically Signed   By: Jacqulynn Cadet M.D.   On: 01/07/2018 10:46   Ir [employer] Stent  Initial Vein  Inc Angioplasty  Result Date: 01/07/2018 INDICATION: 57 year-old male with metastatic colon cancer an acute severe iliocaval and left lower extremity DVT. He presents for thrombectomy. EXAM: 1. Ultrasound-guided vascular access left popliteal vein 2. Ultrasound-guided access right common femoral vein 3. Ultrasound-guided access right internal jugular vein 4. Second ultrasound-guided access right internal jugular vein 5. Left lower extremity venogram 6. Right lower extremity venogram 7. Inferior vena cavogram 8. Combined  pharmacomechanical thrombolysis/thrombectomy using tPA, the AngioVac device, and the Angiojet device 9. IVC filter retrieval 10. Recanalization and balloon angioplasty of the right common iliac vein 11. Recanalization and angioplasty with subsequent stent placement in the left iliac vein 12. Balloon angioplasty of the left common femoral and femoral veins Operating Physicians: Corrie Mckusick, DO and Jacqulynn Cadet, MD COMPARISON:  CT scan 12/30/2017 MEDICATIONS: 12 mg tPA administered directly into thrombus throughout the course of the procedure ANESTHESIA/SEDATION: General  anesthesia provided by the anesthesiology service FLUOROSCOPY TIME:  Fluoroscopy Time: 67 minutes 6 seconds (816.6 mGy). COMPLICATIONS: None immediate. TECHNIQUE: Informed written consent was obtained from the patient after a thorough discussion of the procedural risks, benefits and alternatives. All questions were addressed. Maximal Sterile Barrier Technique was utilized including caps, mask, sterile gowns, sterile gloves, sterile drape, hand hygiene and skin antiseptic. A timeout was performed prior to the initiation of the procedure. The patient was initially positioned prone. The left popliteal vein was interrogated with ultrasound and found to be widely patent. An image was obtained and stored for the medical record. Local anesthesia was attained by infiltration with 1% lidocaine. A small dermatotomy was made. Under real-time sonographic guidance, the vessel was punctured with a 21 gauge micropuncture needle. Using standard technique, the initial micro needle was exchanged over a 0.018 micro wire for a transitional 4 [place] micro sheath. The micro sheath was then exchanged over a 0.035 wire for a 45 cm 6 [place] braided Arrow sheath. A left lower extremity venogram was performed. The femoral vein is completely occluded in the thigh. The popliteal vein remains patent. 6 mg tPA was laced throughout the thrombus within the common and femoral  veins. The patient was repositioned supine and intubated. The right common femoral was interrogated with ultrasound and found to be patent with changes of chronic DVT. An image was obtained and stored for the medical record. Local anesthesia was attained by infiltration with 1% lidocaine. A small dermatotomy was made. Under real-time sonographic guidance, the vessel was punctured with a 21 gauge micropuncture needle. Using standard technique, the initial micro needle was exchanged over a 0.018 micro wire for a transitional 4 [place] micro sheath. The micro sheath was then exchanged over a 0.035 wire for a 6 French vascular sheath. Right lower extremity venography was performed. The common femoral and external iliac veins are patent. 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 All venous drainage is via parallel spinous collaterals. An angled catheter and glidewire were introduced through the sheath. With some manipulation, the catheter was successfully advanced into the suprarenal IVC. An inferior vena cavagram was performed confirming patency of both renal veins and the super renal IVC. A superstiff Amplatz wire was advanced into the right heart. A 10 x 40 mm Conquest balloon was introduced over the wire the occluded segment of the common iliac vein was angioplastied. Repeat venography demonstrates recreation of a small vascular channel. A 10 x 40 mm Conquest balloon was then selected and introduced over the wire. Venography demonstrates successful recanalization of the common iliac vein. There is flow through the IVC. 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 The 6 French sheath was exchanged in the soft tissue tract dilated to 24 [place]. A 26 French Gore dry seal sheath was then advanced over the wire and positioned in the right common iliac vein. The right internal jugular vein was interrogated with ultrasound and found to be widely patent. An image was  obtained and stored for the medical record. Local anesthesia was attained by infiltration with 1% lidocaine. A small dermatotomy was made. Under real-time sonographic guidance, the vessel was punctured with a 21 gauge micropuncture needle. Using standard technique, the initial micro needle was exchanged over a 0.018 micro wire for a transitional 4 [place] micro sheath. The micro sheath was then exchanged over a 0.035 wire for a 6 French vascular sheath. A superstiff Amplatz wire was advanced into the suprarenal IVC. The  skin tract was then serially dilated and an 61 French reperfusion catheter was advanced and position with the tip at the cavoatrial junction. At this time, the patient was fully heparinized until the ACT level was greater than 300 and the AngioVac circuit was established. The angio [facility] device was advanced through the 44 French sheath from the right common femoral approach and suction thrombectomy was performed throughout the infrarenal inferior vena cava. Working from the popliteal access, additional left lower extremity venography images were performed focusing on the iliac and common femoral vasculature. Thrombus is present within the common femoral vein and the external iliac vein. The left common iliac vein is completely occluded similar to the other side. Attempts were made to pass through the occluded left common iliac vein from below. This was unsuccessful. Therefore, a second right internal jugular venous access was required. The right internal jugular vein was was again interrogated with ultrasound and found to be widely patent. An image was obtained and stored for the medical record. Local anesthesia was attained by infiltration with 1% lidocaine. A small dermatotomy was made. Under real-time sonographic guidance, the vessel was punctured with a 21 gauge micropuncture needle. Using standard technique, the initial micro needle was exchanged over a 0.018 micro wire for a transitional 4 [place]  micro sheath. The micro sheath was then exchanged over a 0.035 wire for a 6 French vascular sheath. An angled catheter and glidewire were advanced through the sheath, through the IVC filter, through the infrarenal IVC and the catheter successfully navigated across the chronically occluded left common iliac vein. The glidewire was then exchanged for an exchange length Glidewire and snared from the femoral vein in the midthigh and brought through the popliteal sheath access resulting in successful through and through access from the right internal jugular vein through the left popliteal vein. A catheter was then advanced over the wire into the femoral vein and the glidewire exchanged for a superstiff Amplatz wire. At this time, another cavagram was performed demonstrating successful debulking of the caval thrombus. At this point it was decided to proceed with removal of the IVC filter. Over the superstiff Amplatz wire from the right jugular vein approach, the skin tract was dilated and a second 26 [place] Gore dry seal sheath was advanced and positioned in the suprarenal IVC. A Bard IVC filter retrieval set was then advanced coaxially through this device next to the Amplatz wire. The Bard [place] IVC filter was successfully retrieved using standard technique. Additional thrombectomy was then performed using the angio VAC device. This yielded significant material. Repeat vena cavagram demonstrates complete removal of thrombus from the inferior vena cava. Attention was next turned to the occluded left common iliac artery. The perfusion circuit was reversed and the reperfusion catheter was placed in the right femoral sheath and the angio back device was advanced through the right IJ sheath. Suction thrombectomy was performed in the right common iliac vein. Working from the popliteal access, angioplasty was performed of the common femoral, external iliac and common iliac veins and thrombus was pushed into the AngioVac.  There appears to be a significant stenosis at the transition from the common iliac to the external iliac vein. This was resistant to initial angioplasty, so a 14 x 60 mm wall stent was selected, placed across the lesion and deployed. The wall stent was then post dilated to 12 mm using the 12 by 40 Conquest balloon. Catheter venography demonstrates significant near occlusive thrombus throughout the common femoral vein, and femoral  vein in the thigh. Therefore, the decision was made to proceed with additional thrombectomy from the popliteal access using the [facility]. The 6 French left popliteal sheath was exchanged over the wire for a 9 French sheath. The is a long take catheter was advanced over the wire and real lytic thrombectomy performed throughout the femoral, common femoral and iliac veins. Follow-up angiography demonstrates significant improvement with restoration of in-line flow. There is a significant residual stenosis at the common femoral vein. This was angioplastied to 12 mm using the 12 x 40 mm Conquest balloon. On the final angiographic runs, there is successful restoration of in-line flow from the popliteal access through the IVC as well as successful recanalization of both iliac veins. All wires and devices were removed. Once the patient's ACT drop below 200, the sheaths were removed. Hemostasis was attained with a combination of manual pressure and pursestring sutures at both 26 [place] sheath sites. 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 Significant stenosis at the transition from the left common to the left external iliac vein 3. 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 Successful recanalization and angioplasty of the chronically occluded right common iliac vein 2. Successful thrombolysis/thrombectomy of the inferior vena cava, left-sided iliac veins and left lower extremity deep venous system to the level of the popliteal  vein. 3. Stent assisted angioplasty of a flow limiting stenosis at the interface of the left common and external iliac veins. 4. Angioplasty of a stenotic segment of the left common femoral vein. 5. The IVC filter was retrieved.  No additional filter was placed. PLAN: 1. Continue full-dose heparin and transitioned to weight based Lovenox prior to discharge. 2. Progress toward ambulation as soon as clinically possible. 3. Compression garment or Ace wraps to the left lower extremity if needed to facilitate edema reduction. 4. Follow-up with Dr. Laurence Ferrari in interventional radiology clinic in 2 weeks. 5. If patient demonstrates any evidence of bleeding and needs to come off of his anticoagulation in the future, he must have an IVC filter replaced. Signed, Criselda Peaches, MD [facility] Radiology Electronically Signed   By: Jacqulynn Cadet M.D.   On: 01/07/2018 10:46   Ir Ivc Filter Retrieval / S&i /img Guid/mod Sed  Result Date: 01/07/2018 INDICATION: 57 year-old male with metastatic colon cancer an acute severe iliocaval and left lower extremity DVT. He presents for thrombectomy. EXAM: 1. Ultrasound-guided vascular access left popliteal vein 2. Ultrasound-guided access right common femoral vein 3. Ultrasound-guided access right internal jugular vein 4. Second ultrasound-guided access right internal jugular vein 5. Left lower extremity venogram 6. Right lower extremity venogram 7. Inferior vena cavogram 8. Combined pharmacomechanical thrombolysis/thrombectomy using tPA, the AngioVac device, and the Angiojet device 9. IVC filter retrieval 10. Recanalization and balloon angioplasty of the right common iliac vein 11. Recanalization and angioplasty with subsequent stent placement in the left iliac vein 12. Balloon angioplasty of the left common femoral and femoral veins Operating Physicians: Corrie Mckusick, DO and Jacqulynn Cadet, MD COMPARISON:  CT scan  12/30/2017 MEDICATIONS: 12 mg tPA administered directly into thrombus throughout the course of the procedure ANESTHESIA/SEDATION: General anesthesia provided by the anesthesiology service FLUOROSCOPY TIME:  Fluoroscopy Time: 67 minutes 6 seconds (816.6 mGy). COMPLICATIONS: None immediate. TECHNIQUE: Informed written consent was obtained from the patient after a thorough discussion of the procedural risks, benefits and alternatives. All questions were addressed. Maximal Sterile Barrier Technique was utilized including caps,  mask, sterile gowns, sterile gloves, sterile drape, hand hygiene and skin antiseptic. A timeout was performed prior to the initiation of the procedure. The patient was initially positioned prone. The left popliteal vein was interrogated with ultrasound and found to be widely patent. An image was obtained and stored for the medical record. Local anesthesia was attained by infiltration with 1% lidocaine. A small dermatotomy was made. Under real-time sonographic guidance, the vessel was punctured with a 21 gauge micropuncture needle. Using standard technique, the initial micro needle was exchanged over a 0.018 micro wire for a transitional 4 [place] micro sheath. The micro sheath was then exchanged over a 0.035 wire for a 45 cm 6 [place] braided Arrow sheath. A left lower extremity venogram was performed. The femoral vein is completely occluded in the thigh. The popliteal vein remains patent. 6 mg tPA was laced throughout the thrombus within the common and femoral veins. The patient was repositioned supine and intubated. The right common femoral was interrogated with ultrasound and found to be patent with changes of chronic DVT. An image was obtained and stored for the medical record. Local anesthesia was attained by infiltration with 1% lidocaine. A small dermatotomy was made. Under real-time sonographic guidance, the vessel was punctured with a 21 gauge micropuncture needle. Using standard technique,  the initial micro needle was exchanged over a 0.018 micro wire for a transitional 4 [place] micro sheath. The micro sheath was then exchanged over a 0.035 wire for a 6 French vascular sheath. Right lower extremity venography was performed. The common femoral and external iliac veins are patent. 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 All venous drainage is via parallel spinous collaterals. An angled catheter and glidewire were introduced through the sheath. With some manipulation, the catheter was successfully advanced into the suprarenal IVC. An inferior vena cavagram was performed confirming patency of both renal veins and the super renal IVC. A superstiff Amplatz wire was advanced into the right heart. A 10 x 40 mm Conquest balloon was introduced over the wire the occluded segment of the common iliac vein was angioplastied. Repeat venography demonstrates recreation of a small vascular channel. A 10 x 40 mm Conquest balloon was then selected and introduced over the wire. Venography demonstrates successful recanalization of the common iliac vein. There is flow through the IVC. 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 The 6 French sheath was exchanged in the soft tissue tract dilated to 24 [place]. A 26 French Gore dry seal sheath was then advanced over the wire and positioned in the right common iliac vein. The right internal jugular vein was interrogated with ultrasound and found to be widely patent. An image was obtained and stored for the medical record. Local anesthesia was attained by infiltration with 1% lidocaine. A small dermatotomy was made. Under real-time sonographic guidance, the vessel was punctured with a 21 gauge micropuncture needle. Using standard technique, the initial micro needle was exchanged over a 0.018 micro wire for a transitional 4 [place] micro sheath. The micro sheath was then exchanged over a 0.035 wire for a 6 French vascular  sheath. A superstiff Amplatz wire was advanced into the suprarenal IVC. The skin tract was then serially dilated and an 25 French reperfusion catheter was advanced and position with the tip at the cavoatrial junction. At this time, the patient was fully heparinized until the ACT level was greater than 300 and the AngioVac circuit was established. The angio [facility] device was advanced through  the 9 French sheath from the right common femoral approach and suction thrombectomy was performed throughout the infrarenal inferior vena cava. Working from the popliteal access, additional left lower extremity venography images were performed focusing on the iliac and common femoral vasculature. Thrombus is present within the common femoral vein and the external iliac vein. The left common iliac vein is completely occluded similar to the other side. Attempts were made to pass through the occluded left common iliac vein from below. This was unsuccessful. Therefore, a second right internal jugular venous access was required. The right internal jugular vein was was again interrogated with ultrasound and found to be widely patent. An image was obtained and stored for the medical record. Local anesthesia was attained by infiltration with 1% lidocaine. A small dermatotomy was made. Under real-time sonographic guidance, the vessel was punctured with a 21 gauge micropuncture needle. Using standard technique, the initial micro needle was exchanged over a 0.018 micro wire for a transitional 4 [place] micro sheath. The micro sheath was then exchanged over a 0.035 wire for a 6 French vascular sheath. An angled catheter and glidewire were advanced through the sheath, through the IVC filter, through the infrarenal IVC and the catheter successfully navigated across the chronically occluded left common iliac vein. The glidewire was then exchanged for an exchange length Glidewire and snared from the femoral vein in the midthigh and brought through  the popliteal sheath access resulting in successful through and through access from the right internal jugular vein through the left popliteal vein. A catheter was then advanced over the wire into the femoral vein and the glidewire exchanged for a superstiff Amplatz wire. At this time, another cavagram was performed demonstrating successful debulking of the caval thrombus. At this point it was decided to proceed with removal of the IVC filter. Over the superstiff Amplatz wire from the right jugular vein approach, the skin tract was dilated and a second 26 [place] Gore dry seal sheath was advanced and positioned in the suprarenal IVC. A Bard IVC filter retrieval set was then advanced coaxially through this device next to the Amplatz wire. The Bard [place] IVC filter was successfully retrieved using standard technique. Additional thrombectomy was then performed using the angio VAC device. This yielded significant material. Repeat vena cavagram demonstrates complete removal of thrombus from the inferior vena cava. Attention was next turned to the occluded left common iliac artery. The perfusion circuit was reversed and the reperfusion catheter was placed in the right femoral sheath and the angio back device was advanced through the right IJ sheath. Suction thrombectomy was performed in the right common iliac vein. Working from the popliteal access, angioplasty was performed of the common femoral, external iliac and common iliac veins and thrombus was pushed into the AngioVac. There appears to be a significant stenosis at the transition from the common iliac to the external iliac vein. This was resistant to initial angioplasty, so a 14 x 60 mm wall stent was selected, placed across the lesion and deployed. The wall stent was then post dilated to 12 mm using the 12 by 40 Conquest balloon. Catheter venography demonstrates significant near occlusive thrombus throughout the common femoral vein, and femoral vein in the thigh.  Therefore, the decision was made to proceed with additional thrombectomy from the popliteal access using the [facility]. The 6 French left popliteal sheath was exchanged over the wire for a 9 French sheath. The is a long take catheter was advanced over the wire and  real lytic thrombectomy performed throughout the femoral, common femoral and iliac veins. Follow-up angiography demonstrates significant improvement with restoration of in-line flow. There is a significant residual stenosis at the common femoral vein. This was angioplastied to 12 mm using the 12 x 40 mm Conquest balloon. On the final angiographic runs, there is successful restoration of in-line flow from the popliteal access through the IVC as well as successful recanalization of both iliac veins. All wires and devices were removed. Once the patient's ACT drop below 200, the sheaths were removed. Hemostasis was attained with a combination of manual pressure and pursestring sutures at both 26 [place] sheath sites. 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 Significant stenosis at the transition from the left common to the left external iliac vein 3. 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 Successful recanalization and angioplasty of the chronically occluded right common iliac vein 2. Successful thrombolysis/thrombectomy of the inferior vena cava, left-sided iliac veins and left lower extremity deep venous system to the level of the popliteal vein. 3. Stent assisted angioplasty of a flow limiting stenosis at the interface of the left common and external iliac veins. 4. Angioplasty of a stenotic segment of the left common femoral vein. 5. The IVC filter was retrieved.  No additional filter was placed. PLAN: 1. Continue full-dose heparin and transitioned to weight based Lovenox prior to discharge. 2. Progress toward ambulation as soon as clinically possible. 3. Compression garment or Ace wraps  to the left lower extremity if needed to facilitate edema reduction. 4. Follow-up with Dr. Laurence Ferrari in interventional radiology clinic in 2 weeks. 5. If patient demonstrates any evidence of bleeding and needs to come off of his anticoagulation in the future, he must have an IVC filter replaced. Signed, Criselda Peaches, MD [facility] Radiology Electronically Signed   By: Jacqulynn Cadet M.D.   On: 01/07/2018 10:46    Labs:  CBC: Recent Labs    01/05/18 0601 01/06/18 0749 01/06/18 1503 01/06/18 2311 01/07/18 0351  WBC 5.5 5.4  --  5.0 6.7  HGB 8.4* 7.8* 10.5* 7.9* 7.5*  HCT 26.3* 25.8* 31.0* 23.9* 23.1*  PLT 180 160  --  115* 128*    COAGS: Recent Labs    01/29/17 1256 07/11/17 0340  08/12/17 0527 08/19/17 1047  01/02/18 1815 01/03/18 0158 01/03/18 0829 01/04/18 0615  INR 1.07 1.27  --  1.02 1.77  --   --   --   --   --   APTT 31  --    < >  --   --    < > 44* 72* 82* 91*   < > = values in this interval not displayed.    BMP: Recent Labs    01/01/18 0528 01/02/18 0537 01/06/18 1503 01/06/18 2311 01/07/18 0351  NA 135 132* 138 134* 136  K 4.0 3.5 3.8 4.4 4.5  CL 101 99*  --  102 104  CO2 28 26  --  23 24  GLUCOSE 106* 101* 111* 151* 136*  BUN 10 8  --  9 11  CALCIUM 8.2* 8.1*  --  7.5* 7.7*  CREATININE 0.78 0.69  --  0.73 0.80  GFRNONAA >60 >60  --  >60 >60  GFRAA >60 >60  --  >60 >60    LIVER FUNCTION TESTS: Recent Labs    11/29/17 0950 12/13/17 0835 12/27/17 0820 12/30/17 1907  BILITOT 0.34 0.31 0.52 0.7  AST 21 29 16 22   ALT 9 10 6  8*  ALKPHOS 94 95 69 66  PROT 7.9 8.2 7.8 8.4*  ALBUMIN 3.6 3.7 3.0* 3.2*    Assessment and Plan:  Acute iliocaval thrombosis, with extension into the left CFV and femoral vein, secondary to filter and hx of malignancy.   S/P IVC filter retrieval AngioVac aspiration thrombectomy of IVC and Iliac thrombus Balloon angioplasty and stenting of chronic left  external iliac vein occlusion Balloon angioplasty for maceration of LLE thrombus AngioJet aspiration thrombectomy of the L femoral, CFV, and iliac system for DVT treatment By Dr. Laurence Ferrari and Dr. Earleen Newport 01/07/2018  Continue anticoagulation due to hypercoagulable state.  Will remove the purse string sutures from Right neck and right groin tomorrow.   Electronically Signed: Murrell Redden, PA-C 01/07/2018, 3:55 PM   I spent a total of 15 Minutes at the the patient's bedside AND on the patient's hospital floor or unit, greater than 50% of which was counseling/coordinating care for follow up after Angiovac caval thrombus.

## 2018-01-07 NOTE — Progress Notes (Signed)
Aline d/.c hemostasis achieved with 10 min. Of pressure. Pressure dressing applied. Had c/o of site being a little numb, relieved after tape off, and line out.

## 2018-01-07 NOTE — Progress Notes (Signed)
Heart rate increasing, 120's at rest, increased to 140 when moving. Patient not c/o dizziness, BP stable. Called Dr Chuck Hint orders received, NS bolus.

## 2018-01-07 NOTE — Progress Notes (Signed)
ANTICOAGULATION CONSULT NOTE - Follow-Up Consult  Pharmacy Consult for heparin Indication: DVT  No Known Allergies  Patient Measurements: Height: 5\' 9"  (175.3 cm) Weight: 139 lb 8.8 oz (63.3 kg) IBW/kg (Calculated) : 70.7 Heparin Dosing Weight: 59.4 kg  Vital Signs: Temp: 98 F (36.7 C) (01/11 1315) Temp Source: Oral (01/11 1315) BP: 97/63 (01/11 1315) Pulse Rate: 93 (01/11 1315)  Labs: Recent Labs    01/06/18 0749 01/06/18 1503 01/06/18 2311 01/07/18 0351 01/07/18 0837  HGB 7.8* 10.5* 7.9* 7.5*  --   HCT 25.8* 31.0* 23.9* 23.1*  --   PLT 160  --  115* 128*  --   HEPARINUNFRC 0.33  --  0.72* 0.55 0.16*  CREATININE  --   --  0.73 0.80  --     Estimated Creatinine Clearance: 90.1 mL/min (by C-G formula based on SCr of 0.8 mg/dL).   Medical History: Past Medical History:  Diagnosis Date  . Acute deep vein thrombosis (DVT) of right lower extremity ([place]) 02/18/2017  . Malignant neoplasm of abdomen ([place])    [name] 01/11/2017  . Microcytic anemia    /notes 01/11/2017    Assessment: 57 yo M with new DVT.  Pharmacy consulted to dose low dose heparin with no bolus after pt experienced a nosebleed with Eliquis.  LLE acute DVT diagnosed 1/3, CT scan showed extensive thrombosis in the IVC, common iliac, left iliac vein and femoral vein.  Pt has history of GI bleeding while on Xarelto and also reported a nosebleed while on Eliquis.  Last dose of Eliquis was 1/4 evening.  Pt continues on heparin in the interim.  MD request NO BOLUS and low goal.  Heparin drip 800 uts/hr < goal.  Infusion checked personally and reviewed with RN  - all ok. CBC stable.  Goal of Therapy:  Heparin level 0.2 - 0.5 - low goal per MD request Monitor platelets by anticoagulation protocol: Yes   Plan:  Increase  heparin slightly to 950 units/hr -Daily HL, CBC CHeck HL in [place].D. CPP, BCPS Clinical Pharmacist [phone] 01/07/2018 2:05 PM

## 2018-01-07 NOTE — Progress Notes (Deleted)
Referring Physician(s): Feng,Y  Supervising Physician: Jacqulynn Cadet  Patient Status:  [name] - In-pt  Chief Complaint:  LLE DVT and caval thrombosis  HPI: Acute iliocaval thrombosis, with extension into the left CFV and femoral vein, secondary to filter and hx of malignancy.   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 Laurence Ferrari and Dr. Earleen Newport on 01/07/2018  Subjective:  Patient seen by Dr. Laurence Ferrari. He has been extubated. He was alert and sitting up in bed. No complaints.   Allergies: Patient has no known allergies.  Medications: Prior to Admission medications   Medication Sig Start Date End Date Taking? Authorizing Provider  diphenoxylate-atropine (LOMOTIL) 2.5-0.025 MG tablet Take 1-2 tablets by mouth 4 (four) times daily as needed for diarrhea or loose stools. 12/13/17  Yes [name], MD  docusate sodium (COLACE) 100 MG capsule Take 100 mg by mouth 2 (two) times daily.   Yes [provider]  gabapentin (NEURONTIN) 100 MG capsule Take 1 capsule (100 mg total) 3 (three) times daily by mouth. 11/15/17  Yes [name], MD  Nutritional Supplements (NUTRITIONAL SUPPLEMENT PO) House Supplement - give 120 ml by mouth three times daily between meals for supplement   Yes [provider]  Oxycodone HCl 10 MG TABS Take 1 tablet (10 mg total) by mouth every 6 (six) hours as needed. 12/13/17  Yes [name], MD  Clindamycin Phos-Benzoyl Perox gel Apply 1 application topically 2 (two) times daily. Patient not taking: Reported on 12/30/2017 12/27/17   [name], MD  ferrous sulfate 325 (65 FE) MG tablet Take 1 tablet (325 mg total) by mouth 2 (two) times daily with a meal. Patient not taking:  Reported on 12/27/2017 03/29/17   Maryanna Shape, NP  hydrocortisone 2.5 % cream Apply topically 2 (two) times daily. Patient not taking: Reported on 12/30/2017 10/18/17   Alla Feeling, NP  loperamide (IMODIUM A-D) 2 MG tablet Take 1 tablet (2 mg total) by mouth 4 (four) times daily as needed for diarrhea or loose stools. Patient not taking: Reported on 12/30/2017 03/02/17   [name], MD  magnesium oxide (MAG-OX) 400 (241.3 Mg) MG tablet Take 1 tablet (400 mg total) by mouth 2 (two) times daily. Patient not taking: Reported on 12/30/2017 12/13/17   [name], MD  magnesium oxide (MAG-OX) 400 (241.3 Mg) MG tablet Take 1 tablet (400 mg total) by mouth daily. Patient not taking: Reported on 12/30/2017 12/27/17   [name], MD  mirtazapine (REMERON) 15 MG tablet Take 1 tablet (15 mg total) by mouth at bedtime. Patient not taking: Reported on 12/27/2017 06/22/17   Gardenia Phlegm, NP  ondansetron (ZOFRAN) 8 MG tablet Take 1 tablet (8 mg total) by mouth 2 (two) times daily as needed for refractory nausea / vomiting. Start on day 3 after chemotherapy. Patient not taking: Reported on 12/30/2017 05/25/17   [name], MD  potassium chloride SA (K-DUR,KLOR-CON) 20 MEQ tablet Take 1 tablet (20 mEq total) by mouth 2 (two) times daily. Patient not taking: Reported on 12/30/2017 12/27/17   [name], MD  prochlorperazine (COMPAZINE) 10 MG tablet Take 1 tablet (10 mg total) by mouth every 6 (six) hours as needed (Nausea or vomiting). Patient not taking: Reported on 12/30/2017 05/25/17   [name],  MD     Vital Signs: BP (!) 88/59   Pulse 97   Temp 98.8 F (37.1 C) (Oral)   Resp 11   Ht 5\' 9"  (1.753 m)   Wt 139 lb 8.8 oz (63.3 kg)   SpO2 100%   BMI 20.61 kg/m   Physical Exam Awake and alert NAD Neck hematoma resolved Access sites ok Legs with improved edema.  Imaging: Mr Jeri Cos DJ Contrast  Result Date: 01/05/2018 CLINICAL DATA:  Metastatic colon cancer staging EXAM: MRI HEAD WITHOUT AND WITH  CONTRAST TECHNIQUE: Multiplanar, multiecho pulse sequences of the brain and surrounding structures were obtained without and with intravenous contrast. CONTRAST:  73mL MULTIHANCE GADOBENATE DIMEGLUMINE 529 MG/ML IV SOLN COMPARISON:  CT head 11/30/2014 FINDINGS: Brain: Ventricle size and cerebral volume normal. Negative for infarct, hemorrhage, or mass. No edema. Normal enhancement following contrast infusion. Vascular: Normal arterial flow voids Skull and upper cervical spine: Negative Sinuses/Orbits: Negative Other: None IMPRESSION: Negative MRI head with contrast.  Negative for metastatic disease. Electronically Signed   By: Franchot Gallo M.D.   On: 01/05/2018 11:40   Ir Veno/ext/bi  Result Date: 01/07/2018 INDICATION: 57 year-old male with metastatic colon cancer an acute severe iliocaval and left lower extremity DVT. He presents for thrombectomy. EXAM: 1. Ultrasound-guided vascular access left popliteal vein 2. Ultrasound-guided access right common femoral vein 3. Ultrasound-guided access right internal jugular vein 4. Second ultrasound-guided access right internal jugular vein 5. Left lower extremity venogram 6. Right lower extremity venogram 7. Inferior vena cavogram 8. Combined pharmacomechanical thrombolysis/thrombectomy using tPA, the AngioVac device, and the Angiojet device 9. IVC filter retrieval 10. Recanalization and balloon angioplasty of the right common iliac vein 11. Recanalization and angioplasty with subsequent stent placement in the left iliac vein 12. Balloon angioplasty of the left common femoral and femoral veins Operating Physicians: Corrie Mckusick, DO and Jacqulynn Cadet, MD COMPARISON:  CT scan 12/30/2017 MEDICATIONS: 12 mg tPA administered directly into thrombus throughout the course of the procedure ANESTHESIA/SEDATION: General anesthesia provided by the anesthesiology service FLUOROSCOPY TIME:  Fluoroscopy Time: 67 minutes 6 seconds (816.6 mGy). COMPLICATIONS: None immediate.  TECHNIQUE: Informed written consent was obtained from the patient after a thorough discussion of the procedural risks, benefits and alternatives. All questions were addressed. Maximal Sterile Barrier Technique was utilized including caps, mask, sterile gowns, sterile gloves, sterile drape, hand hygiene and skin antiseptic. A timeout was performed prior to the initiation of the procedure. The patient was initially positioned prone. The left popliteal vein was interrogated with ultrasound and found to be widely patent. An image was obtained and stored for the medical record. Local anesthesia was attained by infiltration with 1% lidocaine. A small dermatotomy was made. Under real-time sonographic guidance, the vessel was punctured with a 21 gauge micropuncture needle. Using standard technique, the initial micro needle was exchanged over a 0.018 micro wire for a transitional 4 [place] micro sheath. The micro sheath was then exchanged over a 0.035 wire for a 45 cm 6 [place] braided Arrow sheath. A left lower extremity venogram was performed. The femoral vein is completely occluded in the thigh. The popliteal vein remains patent. 6 mg tPA was laced throughout the thrombus within the common and femoral veins. The patient was repositioned supine and intubated. The right common femoral was interrogated with ultrasound and found to be patent with changes of chronic DVT. An image was obtained and stored for the medical record. Local anesthesia was attained by infiltration with 1% lidocaine. A small dermatotomy  was made. Under real-time sonographic guidance, the vessel was punctured with a 21 gauge micropuncture needle. Using standard technique, the initial micro needle was exchanged over a 0.018 micro wire for a transitional 4 [place] micro sheath. The micro sheath was then exchanged over a 0.035 wire for a 6 French vascular sheath. Right lower extremity venography was performed. The common femoral and external iliac veins are  patent. 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 All venous drainage is via parallel spinous collaterals. An angled catheter and glidewire were introduced through the sheath. With some manipulation, the catheter was successfully advanced into the suprarenal IVC. An inferior vena cavagram was performed confirming patency of both renal veins and the super renal IVC. A superstiff Amplatz wire was advanced into the right heart. A 10 x 40 mm Conquest balloon was introduced over the wire the occluded segment of the common iliac vein was angioplastied. Repeat venography demonstrates recreation of a small vascular channel. A 10 x 40 mm Conquest balloon was then selected and introduced over the wire. Venography demonstrates successful recanalization of the common iliac vein. There is flow through the IVC. 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 The 6 French sheath was exchanged in the soft tissue tract dilated to 24 [place]. A 26 French Gore dry seal sheath was then advanced over the wire and positioned in the right common iliac vein. The right internal jugular vein was interrogated with ultrasound and found to be widely patent. An image was obtained and stored for the medical record. Local anesthesia was attained by infiltration with 1% lidocaine. A small dermatotomy was made. Under real-time sonographic guidance, the vessel was punctured with a 21 gauge micropuncture needle. Using standard technique, the initial micro needle was exchanged over a 0.018 micro wire for a transitional 4 [place] micro sheath. The micro sheath was then exchanged over a 0.035 wire for a 6 French vascular sheath. A superstiff Amplatz wire was advanced into the suprarenal IVC. The skin tract was then serially dilated and an 67 French reperfusion catheter was advanced and position with the tip at the cavoatrial junction. At this time, the patient was fully heparinized until the ACT level was  greater than 300 and the AngioVac circuit was established. The angio [facility] device was advanced through the 63 French sheath from the right common femoral approach and suction thrombectomy was performed throughout the infrarenal inferior vena cava. Working from the popliteal access, additional left lower extremity venography images were performed focusing on the iliac and common femoral vasculature. Thrombus is present within the common femoral vein and the external iliac vein. The left common iliac vein is completely occluded similar to the other side. Attempts were made to pass through the occluded left common iliac vein from below. This was unsuccessful. Therefore, a second right internal jugular venous access was required. The right internal jugular vein was was again interrogated with ultrasound and found to be widely patent. An image was obtained and stored for the medical record. Local anesthesia was attained by infiltration with 1% lidocaine. A small dermatotomy was made. Under real-time sonographic guidance, the vessel was punctured with a 21 gauge micropuncture needle. Using standard technique, the initial micro needle was exchanged over a 0.018 micro wire for a transitional 4 [place] micro sheath. The micro sheath was then exchanged over a 0.035 wire for a 6 French vascular sheath. An angled catheter and glidewire were advanced through the sheath, through the IVC filter, through the  infrarenal IVC and the catheter successfully navigated across the chronically occluded left common iliac vein. The glidewire was then exchanged for an exchange length Glidewire and snared from the femoral vein in the midthigh and brought through the popliteal sheath access resulting in successful through and through access from the right internal jugular vein through the left popliteal vein. A catheter was then advanced over the wire into the femoral vein and the glidewire exchanged for a superstiff Amplatz wire. At this time,  another cavagram was performed demonstrating successful debulking of the caval thrombus. At this point it was decided to proceed with removal of the IVC filter. Over the superstiff Amplatz wire from the right jugular vein approach, the skin tract was dilated and a second 26 [place] Gore dry seal sheath was advanced and positioned in the suprarenal IVC. A Bard IVC filter retrieval set was then advanced coaxially through this device next to the Amplatz wire. The Bard [place] IVC filter was successfully retrieved using standard technique. Additional thrombectomy was then performed using the angio VAC device. This yielded significant material. Repeat vena cavagram demonstrates complete removal of thrombus from the inferior vena cava. Attention was next turned to the occluded left common iliac artery. The perfusion circuit was reversed and the reperfusion catheter was placed in the right femoral sheath and the angio back device was advanced through the right IJ sheath. Suction thrombectomy was performed in the right common iliac vein. Working from the popliteal access, angioplasty was performed of the common femoral, external iliac and common iliac veins and thrombus was pushed into the AngioVac. There appears to be a significant stenosis at the transition from the common iliac to the external iliac vein. This was resistant to initial angioplasty, so a 14 x 60 mm wall stent was selected, placed across the lesion and deployed. The wall stent was then post dilated to 12 mm using the 12 by 40 Conquest balloon. Catheter venography demonstrates significant near occlusive thrombus throughout the common femoral vein, and femoral vein in the thigh. Therefore, the decision was made to proceed with additional thrombectomy from the popliteal access using the [name]. The 6 French left popliteal sheath was exchanged over the wire for a 9 French sheath. The is a long take catheter was advanced over the wire  and real lytic thrombectomy performed throughout the femoral, common femoral and iliac veins. Follow-up angiography demonstrates significant improvement with restoration of in-line flow. There is a significant residual stenosis at the common femoral vein. This was angioplastied to 12 mm using the 12 x 40 mm Conquest balloon. On the final angiographic runs, there is successful restoration of in-line flow from the popliteal access through the IVC as well as successful recanalization of both iliac veins. All wires and devices were removed. Once the patient's ACT drop below 200, the sheaths were removed. Hemostasis was attained with a combination of manual pressure and pursestring sutures at both 26 [place] sheath sites. 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 Significant stenosis at the transition from the left common to the left external iliac vein 3. 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 Successful recanalization and angioplasty of the chronically occluded right common iliac vein 2. Successful thrombolysis/thrombectomy of the inferior vena cava, left-sided iliac veins and left lower extremity deep venous system to the level of the popliteal vein. 3. Stent assisted angioplasty of a flow limiting stenosis at the interface of the left common and external iliac  veins. 4. Angioplasty of a stenotic segment of the left common femoral vein. 5. The IVC filter was retrieved.  No additional filter was placed. PLAN: 1. Continue full-dose heparin and transitioned to weight based Lovenox prior to discharge. 2. Progress toward ambulation as soon as clinically possible. 3. Compression garment or Ace wraps to the left lower extremity if needed to facilitate edema reduction. 4. Follow-up with Dr. Laurence Ferrari in interventional radiology clinic in 2 weeks. 5. If patient demonstrates any evidence of bleeding and needs to come off of his anticoagulation in the future, he must have an IVC  filter replaced. Signed, Criselda Peaches, MD [name] Radiology Electronically Signed   By: Jacqulynn Cadet M.D.   On: 01/07/2018 10:46   Ir Mariana Arn Ivc  Result Date: 01/07/2018 INDICATION: 57 year-old male with metastatic colon cancer an acute severe iliocaval and left lower extremity DVT. He presents for thrombectomy. EXAM: 1. Ultrasound-guided vascular access left popliteal vein 2. Ultrasound-guided access right common femoral vein 3. Ultrasound-guided access right internal jugular vein 4. Second ultrasound-guided access right internal jugular vein 5. Left lower extremity venogram 6. Right lower extremity venogram 7. Inferior vena cavogram 8. Combined pharmacomechanical thrombolysis/thrombectomy using tPA, the AngioVac device, and the Angiojet device 9. IVC filter retrieval 10. Recanalization and balloon angioplasty of the right common iliac vein 11. Recanalization and angioplasty with subsequent stent placement in the left iliac vein 12. Balloon angioplasty of the left common femoral and femoral veins Operating Physicians: Corrie Mckusick, DO and Jacqulynn Cadet, MD COMPARISON:  CT scan 12/30/2017 MEDICATIONS: 12 mg tPA administered directly into thrombus throughout the course of the procedure ANESTHESIA/SEDATION: General anesthesia provided by the anesthesiology service FLUOROSCOPY TIME:  Fluoroscopy Time: 67 minutes 6 seconds (816.6 mGy). COMPLICATIONS: None immediate. TECHNIQUE: Informed written consent was obtained from the patient after a thorough discussion of the procedural risks, benefits and alternatives. All questions were addressed. Maximal Sterile Barrier Technique was utilized including caps, mask, sterile gowns, sterile gloves, sterile drape, hand hygiene and skin antiseptic. A timeout was performed prior to the initiation of the procedure. The patient was initially positioned prone. The left popliteal vein was interrogated with  ultrasound and found to be widely patent. An image was obtained and stored for the medical record. Local anesthesia was attained by infiltration with 1% lidocaine. A small dermatotomy was made. Under real-time sonographic guidance, the vessel was punctured with a 21 gauge micropuncture needle. Using standard technique, the initial micro needle was exchanged over a 0.018 micro wire for a transitional 4 [place] micro sheath. The micro sheath was then exchanged over a 0.035 wire for a 45 cm 6 [place] braided Arrow sheath. A left lower extremity venogram was performed. The femoral vein is completely occluded in the thigh. The popliteal vein remains patent. 6 mg tPA was laced throughout the thrombus within the common and femoral veins. The patient was repositioned supine and intubated. The right common femoral was interrogated with ultrasound and found to be patent with changes of chronic DVT. An image was obtained and stored for the medical record. Local anesthesia was attained by infiltration with 1% lidocaine. A small dermatotomy was made. Under real-time sonographic guidance, the vessel was punctured with a 21 gauge micropuncture needle. Using standard technique, the initial micro needle was exchanged over a 0.018 micro wire for a transitional 4 [place] micro sheath. The micro sheath was then exchanged over a 0.035 wire for a 6 French vascular sheath. Right lower extremity venography was  performed. The common femoral and external iliac veins are patent. 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 All venous drainage is via parallel spinous collaterals. An angled catheter and glidewire were introduced through the sheath. With some manipulation, the catheter was successfully advanced into the suprarenal IVC. An inferior vena cavagram was performed confirming patency of both renal veins and the super renal IVC. A superstiff Amplatz wire was advanced into the right heart. A 10 x 40 mm Conquest balloon was introduced over  the wire the occluded segment of the common iliac vein was angioplastied. Repeat venography demonstrates recreation of a small vascular channel. A 10 x 40 mm Conquest balloon was then selected and introduced over the wire. Venography demonstrates successful recanalization of the common iliac vein. There is flow through the IVC. 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 The 6 French sheath was exchanged in the soft tissue tract dilated to 24 [place]. A 26 French Gore dry seal sheath was then advanced over the wire and positioned in the right common iliac vein. The right internal jugular vein was interrogated with ultrasound and found to be widely patent. An image was obtained and stored for the medical record. Local anesthesia was attained by infiltration with 1% lidocaine. A small dermatotomy was made. Under real-time sonographic guidance, the vessel was punctured with a 21 gauge micropuncture needle. Using standard technique, the initial micro needle was exchanged over a 0.018 micro wire for a transitional 4 [place] micro sheath. The micro sheath was then exchanged over a 0.035 wire for a 6 French vascular sheath. A superstiff Amplatz wire was advanced into the suprarenal IVC. The skin tract was then serially dilated and an 18 French reperfusion catheter was advanced and position with the tip at the cavoatrial junction. At this time, the patient was fully heparinized until the ACT level was greater than 300 and the AngioVac circuit was established. The angio [facility] device was advanced through the 37 French sheath from the right common femoral approach and suction thrombectomy was performed throughout the infrarenal inferior vena cava. Working from the popliteal access, additional left lower extremity venography images were performed focusing on the iliac and common femoral vasculature. Thrombus is present within the common femoral vein and the external iliac vein. The  left common iliac vein is completely occluded similar to the other side. Attempts were made to pass through the occluded left common iliac vein from below. This was unsuccessful. Therefore, a second right internal jugular venous access was required. The right internal jugular vein was was again interrogated with ultrasound and found to be widely patent. An image was obtained and stored for the medical record. Local anesthesia was attained by infiltration with 1% lidocaine. A small dermatotomy was made. Under real-time sonographic guidance, the vessel was punctured with a 21 gauge micropuncture needle. Using standard technique, the initial micro needle was exchanged over a 0.018 micro wire for a transitional 4 [place] micro sheath. The micro sheath was then exchanged over a 0.035 wire for a 6 French vascular sheath. An angled catheter and glidewire were advanced through the sheath, through the IVC filter, through the infrarenal IVC and the catheter successfully navigated across the chronically occluded left common iliac vein. The glidewire was then exchanged for an exchange length Glidewire and snared from the femoral vein in the midthigh and brought through the popliteal sheath access resulting in successful through and through access from the right internal jugular vein through the left popliteal  vein. A catheter was then advanced over the wire into the femoral vein and the glidewire exchanged for a superstiff Amplatz wire. At this time, another cavagram was performed demonstrating successful debulking of the caval thrombus. At this point it was decided to proceed with removal of the IVC filter. Over the superstiff Amplatz wire from the right jugular vein approach, the skin tract was dilated and a second 26 [place] Gore dry seal sheath was advanced and positioned in the suprarenal IVC. A Bard IVC filter retrieval set was then advanced coaxially through this device next to the Amplatz wire. The Bard [place] IVC filter  was successfully retrieved using standard technique. Additional thrombectomy was then performed using the angio VAC device. This yielded significant material. Repeat vena cavagram demonstrates complete removal of thrombus from the inferior vena cava. Attention was next turned to the occluded left common iliac artery. The perfusion circuit was reversed and the reperfusion catheter was placed in the right femoral sheath and the angio back device was advanced through the right IJ sheath. Suction thrombectomy was performed in the right common iliac vein. Working from the popliteal access, angioplasty was performed of the common femoral, external iliac and common iliac veins and thrombus was pushed into the AngioVac. There appears to be a significant stenosis at the transition from the common iliac to the external iliac vein. This was resistant to initial angioplasty, so a 14 x 60 mm wall stent was selected, placed across the lesion and deployed. The wall stent was then post dilated to 12 mm using the 12 by 40 Conquest balloon. Catheter venography demonstrates significant near occlusive thrombus throughout the common femoral vein, and femoral vein in the thigh. Therefore, the decision was made to proceed with additional thrombectomy from the popliteal access using the [facility]. The 6 French left popliteal sheath was exchanged over the wire for a 9 French sheath. The is a long take catheter was advanced over the wire and real lytic thrombectomy performed throughout the femoral, common femoral and iliac veins. Follow-up angiography demonstrates significant improvement with restoration of in-line flow. There is a significant residual stenosis at the common femoral vein. This was angioplastied to 12 mm using the 12 x 40 mm Conquest balloon. On the final angiographic runs, there is successful restoration of in-line flow from the popliteal access through the IVC as well as successful recanalization of  both iliac veins. All wires and devices were removed. Once the patient's ACT drop below 200, the sheaths were removed. Hemostasis was attained with a combination of manual pressure and pursestring sutures at both 26 [place] sheath sites. 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 Significant stenosis at the transition from the left common to the left external iliac vein 3. 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 Successful recanalization and angioplasty of the chronically occluded right common iliac vein 2. Successful thrombolysis/thrombectomy of the inferior vena cava, left-sided iliac veins and left lower extremity deep venous system to the level of the popliteal vein. 3. Stent assisted angioplasty of a flow limiting stenosis at the interface of the left common and external iliac veins. 4. Angioplasty of a stenotic segment of the left common femoral vein. 5. The IVC filter was retrieved.  No additional filter was placed. PLAN: 1. Continue full-dose heparin and transitioned to weight based Lovenox prior to discharge. 2. Progress toward ambulation as soon as clinically possible. 3. Compression garment or Ace wraps to the left lower  extremity if needed to facilitate edema reduction. 4. Follow-up with Dr. Laurence Ferrari in interventional radiology clinic in 2 weeks. 5. If patient demonstrates any evidence of bleeding and needs to come off of his anticoagulation in the future, he must have an IVC filter replaced. Signed, Criselda Peaches, MD [facility] Radiology Electronically Signed   By: Jacqulynn Cadet M.D.   On: 01/07/2018 10:46   Ir Thrombect Veno Mech Mod Sed  Result Date: 01/07/2018 INDICATION: 57 year-old male with metastatic colon cancer an acute severe iliocaval and left lower extremity DVT. He presents for thrombectomy. EXAM: 1. Ultrasound-guided vascular access left popliteal vein 2. Ultrasound-guided access  right common femoral vein 3. Ultrasound-guided access right internal jugular vein 4. Second ultrasound-guided access right internal jugular vein 5. Left lower extremity venogram 6. Right lower extremity venogram 7. Inferior vena cavogram 8. Combined pharmacomechanical thrombolysis/thrombectomy using tPA, the AngioVac device, and the Angiojet device 9. IVC filter retrieval 10. Recanalization and balloon angioplasty of the right common iliac vein 11. Recanalization and angioplasty with subsequent stent placement in the left iliac vein 12. Balloon angioplasty of the left common femoral and femoral veins Operating Physicians: Corrie Mckusick, DO and Jacqulynn Cadet, MD COMPARISON:  CT scan 12/30/2017 MEDICATIONS: 12 mg tPA administered directly into thrombus throughout the course of the procedure ANESTHESIA/SEDATION: General anesthesia provided by the anesthesiology service FLUOROSCOPY TIME:  Fluoroscopy Time: 67 minutes 6 seconds (816.6 mGy). COMPLICATIONS: None immediate. TECHNIQUE: Informed written consent was obtained from the patient after a thorough discussion of the procedural risks, benefits and alternatives. All questions were addressed. Maximal Sterile Barrier Technique was utilized including caps, mask, sterile gowns, sterile gloves, sterile drape, hand hygiene and skin antiseptic. A timeout was performed prior to the initiation of the procedure. The patient was initially positioned prone. The left popliteal vein was interrogated with ultrasound and found to be widely patent. An image was obtained and stored for the medical record. Local anesthesia was attained by infiltration with 1% lidocaine. A small dermatotomy was made. Under real-time sonographic guidance, the vessel was punctured with a 21 gauge micropuncture needle. Using standard technique, the initial micro needle was exchanged over a 0.018 micro wire for a transitional 4 [place] micro sheath. The micro sheath was then exchanged over a 0.035 wire for a  45 cm 6 [place] braided Arrow sheath. A left lower extremity venogram was performed. The femoral vein is completely occluded in the thigh. The popliteal vein remains patent. 6 mg tPA was laced throughout the thrombus within the common and femoral veins. The patient was repositioned supine and intubated. The right common femoral was interrogated with ultrasound and found to be patent with changes of chronic DVT. An image was obtained and stored for the medical record. Local anesthesia was attained by infiltration with 1% lidocaine. A small dermatotomy was made. Under real-time sonographic guidance, the vessel was punctured with a 21 gauge micropuncture needle. Using standard technique, the initial micro needle was exchanged over a 0.018 micro wire for a transitional 4 [place] micro sheath. The micro sheath was then exchanged over a 0.035 wire for a 6 French vascular sheath. Right lower extremity venography was performed. The common femoral and external iliac veins are patent. 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 All venous drainage is via parallel spinous collaterals. An angled catheter and glidewire were introduced through the sheath. With some manipulation, the catheter was successfully advanced into the suprarenal IVC. An inferior vena cavagram was performed confirming patency  of both renal veins and the super renal IVC. A superstiff Amplatz wire was advanced into the right heart. A 10 x 40 mm Conquest balloon was introduced over the wire the occluded segment of the common iliac vein was angioplastied. Repeat venography demonstrates recreation of a small vascular channel. A 10 x 40 mm Conquest balloon was then selected and introduced over the wire. Venography demonstrates successful recanalization of the common iliac vein. There is flow through the IVC. 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 The 6 French sheath was exchanged in the soft tissue tract  dilated to 24 [place]. A 26 French Gore dry seal sheath was then advanced over the wire and positioned in the right common iliac vein. The right internal jugular vein was interrogated with ultrasound and found to be widely patent. An image was obtained and stored for the medical record. Local anesthesia was attained by infiltration with 1% lidocaine. A small dermatotomy was made. Under real-time sonographic guidance, the vessel was punctured with a 21 gauge micropuncture needle. Using standard technique, the initial micro needle was exchanged over a 0.018 micro wire for a transitional 4 [place] micro sheath. The micro sheath was then exchanged over a 0.035 wire for a 6 French vascular sheath. A superstiff Amplatz wire was advanced into the suprarenal IVC. The skin tract was then serially dilated and an 72 French reperfusion catheter was advanced and position with the tip at the cavoatrial junction. At this time, the patient was fully heparinized until the ACT level was greater than 300 and the AngioVac circuit was established. The angio [facility] device was advanced through the 93 French sheath from the right common femoral approach and suction thrombectomy was performed throughout the infrarenal inferior vena cava. Working from the popliteal access, additional left lower extremity venography images were performed focusing on the iliac and common femoral vasculature. Thrombus is present within the common femoral vein and the external iliac vein. The left common iliac vein is completely occluded similar to the other side. Attempts were made to pass through the occluded left common iliac vein from below. This was unsuccessful. Therefore, a second right internal jugular venous access was required. The right internal jugular vein was was again interrogated with ultrasound and found to be widely patent. An image was obtained and stored for the medical record. Local anesthesia was attained by infiltration with 1% lidocaine. A  small dermatotomy was made. Under real-time sonographic guidance, the vessel was punctured with a 21 gauge micropuncture needle. Using standard technique, the initial micro needle was exchanged over a 0.018 micro wire for a transitional 4 [place] micro sheath. The micro sheath was then exchanged over a 0.035 wire for a 6 French vascular sheath. An angled catheter and glidewire were advanced through the sheath, through the IVC filter, through the infrarenal IVC and the catheter successfully navigated across the chronically occluded left common iliac vein. The glidewire was then exchanged for an exchange length Glidewire and snared from the femoral vein in the midthigh and brought through the popliteal sheath access resulting in successful through and through access from the right internal jugular vein through the left popliteal vein. A catheter was then advanced over the wire into the femoral vein and the glidewire exchanged for a superstiff Amplatz wire. At this time, another cavagram was performed demonstrating successful debulking of the caval thrombus. At this point it was decided to proceed with removal of the IVC filter. Over the superstiff Amplatz wire  from the right jugular vein approach, the skin tract was dilated and a second 26 [place] Gore dry seal sheath was advanced and positioned in the suprarenal IVC. A Bard IVC filter retrieval set was then advanced coaxially through this device next to the Amplatz wire. The Bard [place] IVC filter was successfully retrieved using standard technique. Additional thrombectomy was then performed using the angio VAC device. This yielded significant material. Repeat vena cavagram demonstrates complete removal of thrombus from the inferior vena cava. Attention was next turned to the occluded left common iliac artery. The perfusion circuit was reversed and the reperfusion catheter was placed in the right femoral sheath and the angio back device was advanced through the right  IJ sheath. Suction thrombectomy was performed in the right common iliac vein. Working from the popliteal access, angioplasty was performed of the common femoral, external iliac and common iliac veins and thrombus was pushed into the AngioVac. There appears to be a significant stenosis at the transition from the common iliac to the external iliac vein. This was resistant to initial angioplasty, so a 14 x 60 mm wall stent was selected, placed across the lesion and deployed. The wall stent was then post dilated to 12 mm using the 12 by 40 Conquest balloon. Catheter venography demonstrates significant near occlusive thrombus throughout the common femoral vein, and femoral vein in the thigh. Therefore, the decision was made to proceed with additional thrombectomy from the popliteal access using the [facility]. The 6 French left popliteal sheath was exchanged over the wire for a 9 French sheath. The is a long take catheter was advanced over the wire and real lytic thrombectomy performed throughout the femoral, common femoral and iliac veins. Follow-up angiography demonstrates significant improvement with restoration of in-line flow. There is a significant residual stenosis at the common femoral vein. This was angioplastied to 12 mm using the 12 x 40 mm Conquest balloon. On the final angiographic runs, there is successful restoration of in-line flow from the popliteal access through the IVC as well as successful recanalization of both iliac veins. All wires and devices were removed. Once the patient's ACT drop below 200, the sheaths were removed. Hemostasis was attained with a combination of manual pressure and pursestring sutures at both 26 [place] sheath sites. 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 Significant stenosis at the transition from the left common to the left external iliac vein 3. 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 Successful  recanalization and angioplasty of the chronically occluded right common iliac vein 2. Successful thrombolysis/thrombectomy of the inferior vena cava, left-sided iliac veins and left lower extremity deep venous system to the level of the popliteal vein. 3. Stent assisted angioplasty of a flow limiting stenosis at the interface of the left common and external iliac veins. 4. Angioplasty of a stenotic segment of the left common femoral vein. 5. The IVC filter was retrieved.  No additional filter was placed. PLAN: 1. Continue full-dose heparin and transitioned to weight based Lovenox prior to discharge. 2. Progress toward ambulation as soon as clinically possible. 3. Compression garment or Ace wraps to the left lower extremity if needed to facilitate edema reduction. 4. Follow-up with Dr. Laurence Ferrari in interventional radiology clinic in 2 weeks. 5. If patient demonstrates any evidence of bleeding and needs to come off of his anticoagulation in the future, he must have an IVC filter replaced. Signed, Criselda Peaches, MD [facility]  [facility] Radiology Electronically Signed   By: Jacqulynn Cadet M.D.   On: 01/07/2018 10:46   Ir US Guide Vasc Access Left  Result Date: 01/07/2018 INDICATION: 57 year-old male with metastatic colon cancer an acute severe iliocaval and left lower extremity DVT. He presents for thrombectomy. EXAM: 1. Ultrasound-guided vascular access left popliteal vein 2. Ultrasound-guided access right common femoral vein 3. Ultrasound-guided access right internal jugular vein 4. Second ultrasound-guided access right internal jugular vein 5. Left lower extremity venogram 6. Right lower extremity venogram 7. Inferior vena cavogram 8. Combined pharmacomechanical thrombolysis/thrombectomy using tPA, the AngioVac device, and the Angiojet device 9. IVC filter retrieval 10. Recanalization and balloon angioplasty of the right common iliac vein 11. Recanalization and  angioplasty with subsequent stent placement in the left iliac vein 12. Balloon angioplasty of the left common femoral and femoral veins Operating Physicians: Corrie Mckusick, DO and Jacqulynn Cadet, MD COMPARISON:  CT scan 12/30/2017 MEDICATIONS: 12 mg tPA administered directly into thrombus throughout the course of the procedure ANESTHESIA/SEDATION: General anesthesia provided by the anesthesiology service FLUOROSCOPY TIME:  Fluoroscopy Time: 67 minutes 6 seconds (816.6 mGy). COMPLICATIONS: None immediate. TECHNIQUE: Informed written consent was obtained from the patient after a thorough discussion of the procedural risks, benefits and alternatives. All questions were addressed. Maximal Sterile Barrier Technique was utilized including caps, mask, sterile gowns, sterile gloves, sterile drape, hand hygiene and skin antiseptic. A timeout was performed prior to the initiation of the procedure. The patient was initially positioned prone. The left popliteal vein was interrogated with ultrasound and found to be widely patent. An image was obtained and stored for the medical record. Local anesthesia was attained by infiltration with 1% lidocaine. A small dermatotomy was made. Under real-time sonographic guidance, the vessel was punctured with a 21 gauge micropuncture needle. Using standard technique, the initial micro needle was exchanged over a 0.018 micro wire for a transitional 4 [place] micro sheath. The micro sheath was then exchanged over a 0.035 wire for a 45 cm 6 [place] braided Arrow sheath. A left lower extremity venogram was performed. The femoral vein is completely occluded in the thigh. The popliteal vein remains patent. 6 mg tPA was laced throughout the thrombus within the common and femoral veins. The patient was repositioned supine and intubated. The right common femoral was interrogated with ultrasound and found to be patent with changes of chronic DVT. An image was obtained and stored for the medical record.  Local anesthesia was attained by infiltration with 1% lidocaine. A small dermatotomy was made. Under real-time sonographic guidance, the vessel was punctured with a 21 gauge micropuncture needle. Using standard technique, the initial micro needle was exchanged over a 0.018 micro wire for a transitional 4 [place] micro sheath. The micro sheath was then exchanged over a 0.035 wire for a 6 French vascular sheath. Right lower extremity venography was performed. The common femoral and external iliac veins are patent. 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 All venous drainage is via parallel spinous collaterals. An angled catheter and glidewire were introduced through the sheath. With some manipulation, the catheter was successfully advanced into the suprarenal IVC. An inferior vena cavagram was performed confirming patency of both renal veins and the super renal IVC. A superstiff Amplatz wire was advanced into the right heart. A 10 x 40 mm Conquest balloon was introduced over the wire the occluded segment of the common iliac vein was angioplastied. Repeat venography demonstrates recreation of a small vascular channel. A 10 x 40 mm  Conquest balloon was then selected and introduced over the wire. Venography demonstrates successful recanalization of the common iliac vein. There is flow through the IVC. 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 The 6 French sheath was exchanged in the soft tissue tract dilated to 24 [place]. A 26 [place] Gore dry seal sheath was then advanced over the wire and positioned in the right common iliac vein. The right internal jugular vein was interrogated with ultrasound and found to be widely patent. An image was obtained and stored for the medical record. Local anesthesia was attained by infiltration with 1% lidocaine. A small dermatotomy was made. Under real-time sonographic guidance, the vessel was punctured with a 21 gauge  micropuncture needle. Using standard technique, the initial micro needle was exchanged over a 0.018 micro wire for a transitional 4 [place] micro sheath. The micro sheath was then exchanged over a 0.035 wire for a 6 French vascular sheath. A superstiff Amplatz wire was advanced into the suprarenal IVC. The skin tract was then serially dilated and an 55 French reperfusion catheter was advanced and position with the tip at the cavoatrial junction. At this time, the patient was fully heparinized until the ACT level was greater than 300 and the AngioVac circuit was established. The angio [facility] device was advanced through the 16 [place] sheath from the right common femoral approach and suction thrombectomy was performed throughout the infrarenal inferior vena cava. Working from the popliteal access, additional left lower extremity venography images were performed focusing on the iliac and common femoral vasculature. Thrombus is present within the common femoral vein and the external iliac vein. The left common iliac vein is completely occluded similar to the other side. Attempts were made to pass through the occluded left common iliac vein from below. This was unsuccessful. Therefore, a second right internal jugular venous access was required. The right internal jugular vein was was again interrogated with ultrasound and found to be widely patent. An image was obtained and stored for the medical record. Local anesthesia was attained by infiltration with 1% lidocaine. A small dermatotomy was made. Under real-time sonographic guidance, the vessel was punctured with a 21 gauge micropuncture needle. Using standard technique, the initial micro needle was exchanged over a 0.018 micro wire for a transitional 4 [place] micro sheath. The micro sheath was then exchanged over a 0.035 wire for a 6 French vascular sheath. An angled catheter and glidewire were advanced through the sheath, through the IVC filter, through the infrarenal IVC  and the catheter successfully navigated across the chronically occluded left common iliac vein. The glidewire was then exchanged for an exchange length Glidewire and snared from the femoral vein in the midthigh and brought through the popliteal sheath access resulting in successful through and through access from the right internal jugular vein through the left popliteal vein. A catheter was then advanced over the wire into the femoral vein and the glidewire exchanged for a superstiff Amplatz wire. At this time, another cavagram was performed demonstrating successful debulking of the caval thrombus. At this point it was decided to proceed with removal of the IVC filter. Over the superstiff Amplatz wire from the right jugular vein approach, the skin tract was dilated and a second 26 [place] Gore dry seal sheath was advanced and positioned in the suprarenal IVC. A Bard IVC filter retrieval set was then advanced coaxially through this device next to the Amplatz wire. The Bard [place] IVC filter was successfully retrieved using  standard technique. Additional thrombectomy was then performed using the angio VAC device. This yielded significant material. Repeat vena cavagram demonstrates complete removal of thrombus from the inferior vena cava. Attention was next turned to the occluded left common iliac artery. The perfusion circuit was reversed and the reperfusion catheter was placed in the right femoral sheath and the angio back device was advanced through the right IJ sheath. Suction thrombectomy was performed in the right common iliac vein. Working from the popliteal access, angioplasty was performed of the common femoral, external iliac and common iliac veins and thrombus was pushed into the AngioVac. There appears to be a significant stenosis at the transition from the common iliac to the external iliac vein. This was resistant to initial angioplasty, so a 14 x 60 mm wall stent was selected, placed across the lesion  and deployed. The wall stent was then post dilated to 12 mm using the 12 by 40 Conquest balloon. Catheter venography demonstrates significant near occlusive thrombus throughout the common femoral vein, and femoral vein in the thigh. Therefore, the decision was made to proceed with additional thrombectomy from the popliteal access using the [facility]. The 6 French left popliteal sheath was exchanged over the wire for a 9 French sheath. The is a long take catheter was advanced over the wire and real lytic thrombectomy performed throughout the femoral, common femoral and iliac veins. Follow-up angiography demonstrates significant improvement with restoration of in-line flow. There is a significant residual stenosis at the common femoral vein. This was angioplastied to 12 mm using the 12 x 40 mm Conquest balloon. On the final angiographic runs, there is successful restoration of in-line flow from the popliteal access through the IVC as well as successful recanalization of both iliac veins. All wires and devices were removed. Once the patient's ACT drop below 200, the sheaths were removed. Hemostasis was attained with a combination of manual pressure and pursestring sutures at both 26 [place] sheath sites. 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 Significant stenosis at the transition from the left common to the left external iliac vein 3. 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 Successful recanalization and angioplasty of the chronically occluded right common iliac vein 2. Successful thrombolysis/thrombectomy of the inferior vena cava, left-sided iliac veins and left lower extremity deep venous system to the level of the popliteal vein. 3. Stent assisted angioplasty of a flow limiting stenosis at the interface of the left common and external iliac veins. 4. Angioplasty of a stenotic segment of the left common femoral vein. 5. The IVC filter was  retrieved.  No additional filter was placed. PLAN: 1. Continue full-dose heparin and transitioned to weight based Lovenox prior to discharge. 2. Progress toward ambulation as soon as clinically possible. 3. Compression garment or Ace wraps to the left lower extremity if needed to facilitate edema reduction. 4. Follow-up with Dr. Laurence Ferrari in interventional radiology clinic in 2 weeks. 5. If patient demonstrates any evidence of bleeding and needs to come off of his anticoagulation in the future, he must have an IVC filter replaced. Signed, Criselda Peaches, MD [facility] Radiology Electronically Signed   By: Jacqulynn Cadet M.D.   On: 01/07/2018 10:46   Ir US Guide Vasc Access Right  Result Date: 01/07/2018 INDICATION: 57 year-old male with metastatic colon cancer an acute severe iliocaval and left lower extremity DVT. He presents for thrombectomy. EXAM: 1. Ultrasound-guided vascular access left  popliteal vein 2. Ultrasound-guided access right common femoral vein 3. Ultrasound-guided access right internal jugular vein 4. Second ultrasound-guided access right internal jugular vein 5. Left lower extremity venogram 6. Right lower extremity venogram 7. Inferior vena cavogram 8. Combined pharmacomechanical thrombolysis/thrombectomy using tPA, the AngioVac device, and the Angiojet device 9. IVC filter retrieval 10. Recanalization and balloon angioplasty of the right common iliac vein 11. Recanalization and angioplasty with subsequent stent placement in the left iliac vein 12. Balloon angioplasty of the left common femoral and femoral veins Operating Physicians: Corrie Mckusick, DO and Jacqulynn Cadet, MD COMPARISON:  CT scan 12/30/2017 MEDICATIONS: 12 mg tPA administered directly into thrombus throughout the course of the procedure ANESTHESIA/SEDATION: General anesthesia provided by the anesthesiology service FLUOROSCOPY TIME:  Fluoroscopy Time: 67 minutes 6 seconds  (816.6 mGy). COMPLICATIONS: None immediate. TECHNIQUE: Informed written consent was obtained from the patient after a thorough discussion of the procedural risks, benefits and alternatives. All questions were addressed. Maximal Sterile Barrier Technique was utilized including caps, mask, sterile gowns, sterile gloves, sterile drape, hand hygiene and skin antiseptic. A timeout was performed prior to the initiation of the procedure. The patient was initially positioned prone. The left popliteal vein was interrogated with ultrasound and found to be widely patent. An image was obtained and stored for the medical record. Local anesthesia was attained by infiltration with 1% lidocaine. A small dermatotomy was made. Under real-time sonographic guidance, the vessel was punctured with a 21 gauge micropuncture needle. Using standard technique, the initial micro needle was exchanged over a 0.018 micro wire for a transitional 4 [place] micro sheath. The micro sheath was then exchanged over a 0.035 wire for a 45 cm 6 [place] braided Arrow sheath. A left lower extremity venogram was performed. The femoral vein is completely occluded in the thigh. The popliteal vein remains patent. 6 mg tPA was laced throughout the thrombus within the common and femoral veins. The patient was repositioned supine and intubated. The right common femoral was interrogated with ultrasound and found to be patent with changes of chronic DVT. An image was obtained and stored for the medical record. Local anesthesia was attained by infiltration with 1% lidocaine. A small dermatotomy was made. Under real-time sonographic guidance, the vessel was punctured with a 21 gauge micropuncture needle. Using standard technique, the initial micro needle was exchanged over a 0.018 micro wire for a transitional 4 [place] micro sheath. The micro sheath was then exchanged over a 0.035 wire for a 6 French vascular sheath. Right lower extremity venography was performed. The  common femoral and external iliac veins are patent. 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 All venous drainage is via parallel spinous collaterals. An angled catheter and glidewire were introduced through the sheath. With some manipulation, the catheter was successfully advanced into the suprarenal IVC. An inferior vena cavagram was performed confirming patency of both renal veins and the super renal IVC. A superstiff Amplatz wire was advanced into the right heart. A 10 x 40 mm Conquest balloon was introduced over the wire the occluded segment of the common iliac vein was angioplastied. Repeat venography demonstrates recreation of a small vascular channel. A 10 x 40 mm Conquest balloon was then selected and introduced over the wire. Venography demonstrates successful recanalization of the common iliac vein. There is flow through the IVC. 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 The 6 French sheath was exchanged in the soft tissue tract dilated to 24 [place]. A 26  [place] Gore dry seal sheath was then advanced over the wire and positioned in the right common iliac vein. The right internal jugular vein was interrogated with ultrasound and found to be widely patent. An image was obtained and stored for the medical record. Local anesthesia was attained by infiltration with 1% lidocaine. A small dermatotomy was made. Under real-time sonographic guidance, the vessel was punctured with a 21 gauge micropuncture needle. Using standard technique, the initial micro needle was exchanged over a 0.018 micro wire for a transitional 4 [place] micro sheath. The micro sheath was then exchanged over a 0.035 wire for a 6 French vascular sheath. A superstiff Amplatz wire was advanced into the suprarenal IVC. The skin tract was then serially dilated and an 1 French reperfusion catheter was advanced and position with the tip at the cavoatrial junction. At this time, the patient  was fully heparinized until the ACT level was greater than 300 and the AngioVac circuit was established. The angio [name] device was advanced through the 28 [place] sheath from the right common femoral approach and suction thrombectomy was performed throughout the infrarenal inferior vena cava. Working from the popliteal access, additional left lower extremity venography images were performed focusing on the iliac and common femoral vasculature. Thrombus is present within the common femoral vein and the external iliac vein. The left common iliac vein is completely occluded similar to the other side. Attempts were made to pass through the occluded left common iliac vein from below. This was unsuccessful. Therefore, a second right internal jugular venous access was required. The right internal jugular vein was was again interrogated with ultrasound and found to be widely patent. An image was obtained and stored for the medical record. Local anesthesia was attained by infiltration with 1% lidocaine. A small dermatotomy was made. Under real-time sonographic guidance, the vessel was punctured with a 21 gauge micropuncture needle. Using standard technique, the initial micro needle was exchanged over a 0.018 micro wire for a transitional 4 [place] micro sheath. The micro sheath was then exchanged over a 0.035 wire for a 6 French vascular sheath. An angled catheter and glidewire were advanced through the sheath, through the IVC filter, through the infrarenal IVC and the catheter successfully navigated across the chronically occluded left common iliac vein. The glidewire was then exchanged for an exchange length Glidewire and snared from the femoral vein in the midthigh and brought through the popliteal sheath access resulting in successful through and through access from the right internal jugular vein through the left popliteal vein. A catheter was then advanced over the wire into the femoral vein and the glidewire exchanged  for a superstiff Amplatz wire. At this time, another cavagram was performed demonstrating successful debulking of the caval thrombus. At this point it was decided to proceed with removal of the IVC filter. Over the superstiff Amplatz wire from the right jugular vein approach, the skin tract was dilated and a second 26 [place] Gore dry seal sheath was advanced and positioned in the suprarenal IVC. A Bard IVC filter retrieval set was then advanced coaxially through this device next to the Amplatz wire. The Bard [place] IVC filter was successfully retrieved using standard technique. Additional thrombectomy was then performed using the angio VAC device. This yielded significant material. Repeat vena cavagram demonstrates complete removal of thrombus from the inferior vena cava. Attention was next turned to the occluded left common iliac artery. The perfusion circuit was reversed and the reperfusion catheter was placed in the right femoral  sheath and the angio back device was advanced through the right IJ sheath. Suction thrombectomy was performed in the right common iliac vein. Working from the popliteal access, angioplasty was performed of the common femoral, external iliac and common iliac veins and thrombus was pushed into the AngioVac. There appears to be a significant stenosis at the transition from the common iliac to the external iliac vein. This was resistant to initial angioplasty, so a 14 x 60 mm wall stent was selected, placed across the lesion and deployed. The wall stent was then post dilated to 12 mm using the 12 by 40 Conquest balloon. Catheter venography demonstrates significant near occlusive thrombus throughout the common femoral vein, and femoral vein in the thigh. Therefore, the decision was made to proceed with additional thrombectomy from the popliteal access using the [facility]. The 6 French left popliteal sheath was exchanged over the wire for a 9 French sheath. The is a  long take catheter was advanced over the wire and real lytic thrombectomy performed throughout the femoral, common femoral and iliac veins. Follow-up angiography demonstrates significant improvement with restoration of in-line flow. There is a significant residual stenosis at the common femoral vein. This was angioplastied to 12 mm using the 12 x 40 mm Conquest balloon. On the final angiographic runs, there is successful restoration of in-line flow from the popliteal access through the IVC as well as successful recanalization of both iliac veins. All wires and devices were removed. Once the patient's ACT drop below 200, the sheaths were removed. Hemostasis was attained with a combination of manual pressure and pursestring sutures at both 26 [place] sheath sites. 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 Significant stenosis at the transition from the left common to the left external iliac vein 3. 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 Successful recanalization and angioplasty of the chronically occluded right common iliac vein 2. Successful thrombolysis/thrombectomy of the inferior vena cava, left-sided iliac veins and left lower extremity deep venous system to the level of the popliteal vein. 3. Stent assisted angioplasty of a flow limiting stenosis at the interface of the left common and external iliac veins. 4. Angioplasty of a stenotic segment of the left common femoral vein. 5. The IVC filter was retrieved.  No additional filter was placed. PLAN: 1. Continue full-dose heparin and transitioned to weight based Lovenox prior to discharge. 2. Progress toward ambulation as soon as clinically possible. 3. Compression garment or Ace wraps to the left lower extremity if needed to facilitate edema reduction. 4. Follow-up with Dr. Laurence Ferrari in interventional radiology clinic in 2 weeks. 5. If patient demonstrates any evidence of bleeding and needs to come off of his  anticoagulation in the future, he must have an IVC filter replaced. Signed, Criselda Peaches, MD [facility] Radiology Electronically Signed   By: Jacqulynn Cadet M.D.   On: 01/07/2018 10:46   Ir US Guide Vasc Access Right  Result Date: 01/07/2018 INDICATION: 57 year-old male with metastatic colon cancer an acute severe iliocaval and left lower extremity DVT. He presents for thrombectomy. EXAM: 1. Ultrasound-guided vascular access left popliteal vein 2. Ultrasound-guided access right common femoral vein 3. Ultrasound-guided access right internal jugular vein 4. Second ultrasound-guided access right internal jugular vein 5. Left lower extremity venogram 6. Right lower extremity venogram 7. Inferior vena cavogram 8. Combined pharmacomechanical thrombolysis/thrombectomy using tPA, the AngioVac device, and the Angiojet device 9. IVC filter retrieval  10. Recanalization and balloon angioplasty of the right common iliac vein 11. Recanalization and angioplasty with subsequent stent placement in the left iliac vein 12. Balloon angioplasty of the left common femoral and femoral veins Operating Physicians: Corrie Mckusick, DO and Jacqulynn Cadet, MD COMPARISON:  CT scan 12/30/2017 MEDICATIONS: 12 mg tPA administered directly into thrombus throughout the course of the procedure ANESTHESIA/SEDATION: General anesthesia provided by the anesthesiology service FLUOROSCOPY TIME:  Fluoroscopy Time: 67 minutes 6 seconds (816.6 mGy). COMPLICATIONS: None immediate. TECHNIQUE: Informed written consent was obtained from the patient after a thorough discussion of the procedural risks, benefits and alternatives. All questions were addressed. Maximal Sterile Barrier Technique was utilized including caps, mask, sterile gowns, sterile gloves, sterile drape, hand hygiene and skin antiseptic. A timeout was performed prior to the initiation of the procedure. The patient was initially  positioned prone. The left popliteal vein was interrogated with ultrasound and found to be widely patent. An image was obtained and stored for the medical record. Local anesthesia was attained by infiltration with 1% lidocaine. A small dermatotomy was made. Under real-time sonographic guidance, the vessel was punctured with a 21 gauge micropuncture needle. Using standard technique, the initial micro needle was exchanged over a 0.018 micro wire for a transitional 4 [place] micro sheath. The micro sheath was then exchanged over a 0.035 wire for a 45 cm 6 [place] braided Arrow sheath. A left lower extremity venogram was performed. The femoral vein is completely occluded in the thigh. The popliteal vein remains patent. 6 mg tPA was laced throughout the thrombus within the common and femoral veins. The patient was repositioned supine and intubated. The right common femoral was interrogated with ultrasound and found to be patent with changes of chronic DVT. An image was obtained and stored for the medical record. Local anesthesia was attained by infiltration with 1% lidocaine. A small dermatotomy was made. Under real-time sonographic guidance, the vessel was punctured with a 21 gauge micropuncture needle. Using standard technique, the initial micro needle was exchanged over a 0.018 micro wire for a transitional 4 [place] micro sheath. The micro sheath was then exchanged over a 0.035 wire for a 6 French vascular sheath. Right lower extremity venography was performed. The common femoral and external iliac veins are patent. 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 All venous drainage is via parallel spinous collaterals. An angled catheter and glidewire were introduced through the sheath. With some manipulation, the catheter was successfully advanced into the suprarenal IVC. An inferior vena cavagram was performed confirming patency of both renal veins and the super renal IVC. A superstiff Amplatz wire was advanced into  the right heart. A 10 x 40 mm Conquest balloon was introduced over the wire the occluded segment of the common iliac vein was angioplastied. Repeat venography demonstrates recreation of a small vascular channel. A 10 x 40 mm Conquest balloon was then selected and introduced over the wire. Venography demonstrates successful recanalization of the common iliac vein. There is flow through the IVC. 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 The 6 [place] sheath was exchanged in the soft tissue tract dilated to 24 [place]. A 26 [place] dry seal sheath was then advanced over the wire and positioned in the right common iliac vein. The right internal jugular vein was interrogated with ultrasound and found to be widely patent. An image was obtained and stored for the medical record. Local anesthesia was attained by infiltration with 1% lidocaine. A small  dermatotomy was made. Under real-time sonographic guidance, the vessel was punctured with a 21 gauge micropuncture needle. Using standard technique, the initial micro needle was exchanged over a 0.018 micro wire for a transitional 4 [place] micro sheath. The micro sheath was then exchanged over a 0.035 wire for a 6 French vascular sheath. A superstiff Amplatz wire was advanced into the suprarenal IVC. The skin tract was then serially dilated and an 72 French reperfusion catheter was advanced and position with the tip at the cavoatrial junction. At this time, the patient was fully heparinized until the ACT level was greater than 300 and the AngioVac circuit was established. The angio [name] device was advanced through the 28 [place] sheath from the right common femoral approach and suction thrombectomy was performed throughout the infrarenal inferior vena cava. Working from the popliteal access, additional left lower extremity venography images were performed focusing on the iliac and common femoral vasculature. Thrombus is  present within the common femoral vein and the external iliac vein. The left common iliac vein is completely occluded similar to the other side. Attempts were made to pass through the occluded left common iliac vein from below. This was unsuccessful. Therefore, a second right internal jugular venous access was required. The right internal jugular vein was was again interrogated with ultrasound and found to be widely patent. An image was obtained and stored for the medical record. Local anesthesia was attained by infiltration with 1% lidocaine. A small dermatotomy was made. Under real-time sonographic guidance, the vessel was punctured with a 21 gauge micropuncture needle. Using standard technique, the initial micro needle was exchanged over a 0.018 micro wire for a transitional 4 [place] micro sheath. The micro sheath was then exchanged over a 0.035 wire for a 6 French vascular sheath. An angled catheter and glidewire were advanced through the sheath, through the IVC filter, through the infrarenal IVC and the catheter successfully navigated across the chronically occluded left common iliac vein. The glidewire was then exchanged for an exchange length Glidewire and snared from the femoral vein in the midthigh and brought through the popliteal sheath access resulting in successful through and through access from the right internal jugular vein through the left popliteal vein. A catheter was then advanced over the wire into the femoral vein and the glidewire exchanged for a superstiff Amplatz wire. At this time, another cavagram was performed demonstrating successful debulking of the caval thrombus. At this point it was decided to proceed with removal of the IVC filter. Over the superstiff Amplatz wire from the right jugular vein approach, the skin tract was dilated and a second 26 [place] Gore dry seal sheath was advanced and positioned in the suprarenal IVC. A Bard IVC filter retrieval set was then advanced coaxially  through this device next to the Amplatz wire. The Bard [place] IVC filter was successfully retrieved using standard technique. Additional thrombectomy was then performed using the angio VAC device. This yielded significant material. Repeat vena cavagram demonstrates complete removal of thrombus from the inferior vena cava. Attention was next turned to the occluded left common iliac artery. The perfusion circuit was reversed and the reperfusion catheter was placed in the right femoral sheath and the angio back device was advanced through the right IJ sheath. Suction thrombectomy was performed in the right common iliac vein. Working from the popliteal access, angioplasty was performed of the common femoral, external iliac and common iliac veins and thrombus was pushed into the AngioVac. There appears to be a significant stenosis  at the transition from the common iliac to the external iliac vein. This was resistant to initial angioplasty, so a 14 x 60 mm wall stent was selected, placed across the lesion and deployed. The wall stent was then post dilated to 12 mm using the 12 by 40 Conquest balloon. Catheter venography demonstrates significant near occlusive thrombus throughout the common femoral vein, and femoral vein in the thigh. Therefore, the decision was made to proceed with additional thrombectomy from the popliteal access using the [facility]. The 6 French left popliteal sheath was exchanged over the wire for a 9 French sheath. The is a long take catheter was advanced over the wire and real lytic thrombectomy performed throughout the femoral, common femoral and iliac veins. Follow-up angiography demonstrates significant improvement with restoration of in-line flow. There is a significant residual stenosis at the common femoral vein. This was angioplastied to 12 mm using the 12 x 40 mm Conquest balloon. On the final angiographic runs, there is successful restoration of in-line flow from the  popliteal access through the IVC as well as successful recanalization of both iliac veins. All wires and devices were removed. Once the patient's ACT drop below 200, the sheaths were removed. Hemostasis was attained with a combination of manual pressure and pursestring sutures at both 26 [place] sheath sites. 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 Significant stenosis at the transition from the left common to the left external iliac vein 3. 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 Successful recanalization and angioplasty of the chronically occluded right common iliac vein 2. Successful thrombolysis/thrombectomy of the inferior vena cava, left-sided iliac veins and left lower extremity deep venous system to the level of the popliteal vein. 3. Stent assisted angioplasty of a flow limiting stenosis at the interface of the left common and external iliac veins. 4. Angioplasty of a stenotic segment of the left common femoral vein. 5. The IVC filter was retrieved.  No additional filter was placed. PLAN: 1. Continue full-dose heparin and transitioned to weight based Lovenox prior to discharge. 2. Progress toward ambulation as soon as clinically possible. 3. Compression garment or Ace wraps to the left lower extremity if needed to facilitate edema reduction. 4. Follow-up with Dr. Laurence Ferrari in interventional radiology clinic in 2 weeks. 5. If patient demonstrates any evidence of bleeding and needs to come off of his anticoagulation in the future, he must have an IVC filter replaced. Signed, Criselda Peaches, MD [name] Electronically Signed   By: Jacqulynn Cadet M.D.   On: 01/07/2018 10:46   Dg Chest Port 1 View  Result Date: 01/07/2018 CLINICAL DATA:  Respiratory failure EXAM: PORTABLE CHEST 1 VIEW COMPARISON:  None. FINDINGS: The heart size and mediastinal contours are within normal limits. Both lungs are  clear. Endotracheal tube tip is 4.3 cm above the carina in satisfactory position. Port catheter tip is noted in the distal SVC. No effusion or pneumothorax. The visualized skeletal structures are unremarkable. IMPRESSION: Satisfactory support line and tubes.  Clear lungs. Electronically Signed   By: Ashley Royalty M.D.   On: 01/07/2018 03:30   Ir Pta Venous Except Dialysis Circuit  Result Date: 01/07/2018 INDICATION: 57 year-old male with metastatic colon cancer an acute severe iliocaval and left lower extremity DVT. He presents for thrombectomy. EXAM: 1. Ultrasound-guided vascular access left popliteal vein 2. Ultrasound-guided access right common femoral vein 3. Ultrasound-guided access right internal jugular vein 4.  Second ultrasound-guided access right internal jugular vein 5. Left lower extremity venogram 6. Right lower extremity venogram 7. Inferior vena cavogram 8. Combined pharmacomechanical thrombolysis/thrombectomy using tPA, the AngioVac device, and the Angiojet device 9. IVC filter retrieval 10. Recanalization and balloon angioplasty of the right common iliac vein 11. Recanalization and angioplasty with subsequent stent placement in the left iliac vein 12. Balloon angioplasty of the left common femoral and femoral veins Operating Physicians: Corrie Mckusick, DO and Jacqulynn Cadet, MD COMPARISON:  CT scan 12/30/2017 MEDICATIONS: 12 mg tPA administered directly into thrombus throughout the course of the procedure ANESTHESIA/SEDATION: General anesthesia provided by the anesthesiology service FLUOROSCOPY TIME:  Fluoroscopy Time: 67 minutes 6 seconds (816.6 mGy). COMPLICATIONS: None immediate. TECHNIQUE: Informed written consent was obtained from the patient after a thorough discussion of the procedural risks, benefits and alternatives. All questions were addressed. Maximal Sterile Barrier Technique was utilized including caps, mask, sterile gowns, sterile gloves, sterile drape, hand hygiene and skin  antiseptic. A timeout was performed prior to the initiation of the procedure. The patient was initially positioned prone. The left popliteal vein was interrogated with ultrasound and found to be widely patent. An image was obtained and stored for the medical record. Local anesthesia was attained by infiltration with 1% lidocaine. A small dermatotomy was made. Under real-time sonographic guidance, the vessel was punctured with a 21 gauge micropuncture needle. Using standard technique, the initial micro needle was exchanged over a 0.018 micro wire for a transitional 4 [place] micro sheath. The micro sheath was then exchanged over a 0.035 wire for a 45 cm 6 [place] braided Arrow sheath. A left lower extremity venogram was performed. The femoral vein is completely occluded in the thigh. The popliteal vein remains patent. 6 mg tPA was laced throughout the thrombus within the common and femoral veins. The patient was repositioned supine and intubated. The right common femoral was interrogated with ultrasound and found to be patent with changes of chronic DVT. An image was obtained and stored for the medical record. Local anesthesia was attained by infiltration with 1% lidocaine. A small dermatotomy was made. Under real-time sonographic guidance, the vessel was punctured with a 21 gauge micropuncture needle. Using standard technique, the initial micro needle was exchanged over a 0.018 micro wire for a transitional 4 [place] micro sheath. The micro sheath was then exchanged over a 0.035 wire for a 6 French vascular sheath. Right lower extremity venography was performed. The common femoral and external iliac veins are patent. 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 All venous drainage is via parallel spinous collaterals. An angled catheter and glidewire were introduced through the sheath. With some manipulation, the catheter was successfully advanced into the suprarenal IVC. An inferior vena cavagram was performed  confirming patency of both renal veins and the super renal IVC. A superstiff Amplatz wire was advanced into the right heart. A 10 x 40 mm Conquest balloon was introduced over the wire the occluded segment of the common iliac vein was angioplastied. Repeat venography demonstrates recreation of a small vascular channel. A 10 x 40 mm Conquest balloon was then selected and introduced over the wire. Venography demonstrates successful recanalization of the common iliac vein. There is flow through the IVC. 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 The 6 French sheath was exchanged in the soft tissue tract dilated to 24 [place]. A 26 French Gore dry seal sheath was then advanced over the wire and positioned in the right common  iliac vein. The right internal jugular vein was interrogated with ultrasound and found to be widely patent. An image was obtained and stored for the medical record. Local anesthesia was attained by infiltration with 1% lidocaine. A small dermatotomy was made. Under real-time sonographic guidance, the vessel was punctured with a 21 gauge micropuncture needle. Using standard technique, the initial micro needle was exchanged over a 0.018 micro wire for a transitional 4 [place] micro sheath. The micro sheath was then exchanged over a 0.035 wire for a 6 French vascular sheath. A superstiff Amplatz wire was advanced into the suprarenal IVC. The skin tract was then serially dilated and an 56 French reperfusion catheter was advanced and position with the tip at the cavoatrial junction. At this time, the patient was fully heparinized until the ACT level was greater than 300 and the AngioVac circuit was established. The angio [name] device was advanced through the 71 French sheath from the right common femoral approach and suction thrombectomy was performed throughout the infrarenal inferior vena cava. Working from the popliteal access, additional left lower  extremity venography images were performed focusing on the iliac and common femoral vasculature. Thrombus is present within the common femoral vein and the external iliac vein. The left common iliac vein is completely occluded similar to the other side. Attempts were made to pass through the occluded left common iliac vein from below. This was unsuccessful. Therefore, a second right internal jugular venous access was required. The right internal jugular vein was was again interrogated with ultrasound and found to be widely patent. An image was obtained and stored for the medical record. Local anesthesia was attained by infiltration with 1% lidocaine. A small dermatotomy was made. Under real-time sonographic guidance, the vessel was punctured with a 21 gauge micropuncture needle. Using standard technique, the initial micro needle was exchanged over a 0.018 micro wire for a transitional 4 [place] micro sheath. The micro sheath was then exchanged over a 0.035 wire for a 6 French vascular sheath. An angled catheter and glidewire were advanced through the sheath, through the IVC filter, through the infrarenal IVC and the catheter successfully navigated across the chronically occluded left common iliac vein. The glidewire was then exchanged for an exchange length Glidewire and snared from the femoral vein in the midthigh and brought through the popliteal sheath access resulting in successful through and through access from the right internal jugular vein through the left popliteal vein. A catheter was then advanced over the wire into the femoral vein and the glidewire exchanged for a superstiff Amplatz wire. At this time, another cavagram was performed demonstrating successful debulking of the caval thrombus. At this point it was decided to proceed with removal of the IVC filter. Over the superstiff Amplatz wire from the right jugular vein approach, the skin tract was dilated and a second 26 [place] Gore dry seal sheath was  advanced and positioned in the suprarenal IVC. A Bard IVC filter retrieval set was then advanced coaxially through this device next to the Amplatz wire. The Bard [place] IVC filter was successfully retrieved using standard technique. Additional thrombectomy was then performed using the angio VAC device. This yielded significant material. Repeat vena cavagram demonstrates complete removal of thrombus from the inferior vena cava. Attention was next turned to the occluded left common iliac artery. The perfusion circuit was reversed and the reperfusion catheter was placed in the right femoral sheath and the angio back device was advanced through the right IJ sheath. Suction thrombectomy was performed  in the right common iliac vein. Working from the popliteal access, angioplasty was performed of the common femoral, external iliac and common iliac veins and thrombus was pushed into the AngioVac. There appears to be a significant stenosis at the transition from the common iliac to the external iliac vein. This was resistant to initial angioplasty, so a 14 x 60 mm wall stent was selected, placed across the lesion and deployed. The wall stent was then post dilated to 12 mm using the 12 by 40 Conquest balloon. Catheter venography demonstrates significant near occlusive thrombus throughout the common femoral vein, and femoral vein in the thigh. Therefore, the decision was made to proceed with additional thrombectomy from the popliteal access using the [facility]. The 6 French left popliteal sheath was exchanged over the wire for a 9 French sheath. The is a long take catheter was advanced over the wire and real lytic thrombectomy performed throughout the femoral, common femoral and iliac veins. Follow-up angiography demonstrates significant improvement with restoration of in-line flow. There is a significant residual stenosis at the common femoral vein. This was angioplastied to 12 mm using the 12 x 40 mm  Conquest balloon. On the final angiographic runs, there is successful restoration of in-line flow from the popliteal access through the IVC as well as successful recanalization of both iliac veins. All wires and devices were removed. Once the patient's ACT drop below 200, the sheaths were removed. Hemostasis was attained with a combination of manual pressure and pursestring sutures at both 26 [place] sheath sites. 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 Significant stenosis at the transition from the left common to the left external iliac vein 3. 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 Successful recanalization and angioplasty of the chronically occluded right common iliac vein 2. Successful thrombolysis/thrombectomy of the inferior vena cava, left-sided iliac veins and left lower extremity deep venous system to the level of the popliteal vein. 3. Stent assisted angioplasty of a flow limiting stenosis at the interface of the left common and external iliac veins. 4. Angioplasty of a stenotic segment of the left common femoral vein. 5. The IVC filter was retrieved.  No additional filter was placed. PLAN: 1. Continue full-dose heparin and transitioned to weight based Lovenox prior to discharge. 2. Progress toward ambulation as soon as clinically possible. 3. Compression garment or Ace wraps to the left lower extremity if needed to facilitate edema reduction. 4. Follow-up with Dr. Laurence Ferrari in interventional radiology clinic in 2 weeks. 5. If patient demonstrates any evidence of bleeding and needs to come off of his anticoagulation in the future, he must have an IVC filter replaced. Signed, Criselda Peaches, MD [facility] Electronically Signed   By: Jacqulynn Cadet M.D.   On: 01/07/2018 10:46   Ir Pta Venous Addl Except Dialysis Circuit  Result Date: 01/07/2018 INDICATION: 57 year-old male with metastatic  colon cancer an acute severe iliocaval and left lower extremity DVT. He presents for thrombectomy. EXAM: 1. Ultrasound-guided vascular access left popliteal vein 2. Ultrasound-guided access right common femoral vein 3. Ultrasound-guided access right internal jugular vein 4. Second ultrasound-guided access right internal jugular vein 5. Left lower extremity venogram 6. Right lower extremity venogram 7. Inferior vena cavogram 8. Combined pharmacomechanical thrombolysis/thrombectomy using tPA, the AngioVac device, and the Angiojet device 9. IVC filter retrieval 10. Recanalization and balloon angioplasty of the right common iliac vein 11. Recanalization and angioplasty with  subsequent stent placement in the left iliac vein 12. Balloon angioplasty of the left common femoral and femoral veins Operating Physicians: Corrie Mckusick, DO and Jacqulynn Cadet, MD COMPARISON:  CT scan 12/30/2017 MEDICATIONS: 12 mg tPA administered directly into thrombus throughout the course of the procedure ANESTHESIA/SEDATION: General anesthesia provided by the anesthesiology service FLUOROSCOPY TIME:  Fluoroscopy Time: 67 minutes 6 seconds (816.6 mGy). COMPLICATIONS: None immediate. TECHNIQUE: Informed written consent was obtained from the patient after a thorough discussion of the procedural risks, benefits and alternatives. All questions were addressed. Maximal Sterile Barrier Technique was utilized including caps, mask, sterile gowns, sterile gloves, sterile drape, hand hygiene and skin antiseptic. A timeout was performed prior to the initiation of the procedure. The patient was initially positioned prone. The left popliteal vein was interrogated with ultrasound and found to be widely patent. An image was obtained and stored for the medical record. Local anesthesia was attained by infiltration with 1% lidocaine. A small dermatotomy was made. Under real-time sonographic guidance, the vessel was punctured with a 21 gauge micropuncture needle.  Using standard technique, the initial micro needle was exchanged over a 0.018 micro wire for a transitional 4 [place] micro sheath. The micro sheath was then exchanged over a 0.035 wire for a 45 cm 6 [place] braided Arrow sheath. A left lower extremity venogram was performed. The femoral vein is completely occluded in the thigh. The popliteal vein remains patent. 6 mg tPA was laced throughout the thrombus within the common and femoral veins. The patient was repositioned supine and intubated. The right common femoral was interrogated with ultrasound and found to be patent with changes of chronic DVT. An image was obtained and stored for the medical record. Local anesthesia was attained by infiltration with 1% lidocaine. A small dermatotomy was made. Under real-time sonographic guidance, the vessel was punctured with a 21 gauge micropuncture needle. Using standard technique, the initial micro needle was exchanged over a 0.018 micro wire for a transitional 4 [place] micro sheath. The micro sheath was then exchanged over a 0.035 wire for a 6 French vascular sheath. Right lower extremity venography was performed. The common femoral and external iliac veins are patent. 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 All venous drainage is via parallel spinous collaterals. An angled catheter and glidewire were introduced through the sheath. With some manipulation, the catheter was successfully advanced into the suprarenal IVC. An inferior vena cavagram was performed confirming patency of both renal veins and the super renal IVC. A superstiff Amplatz wire was advanced into the right heart. A 10 x 40 mm Conquest balloon was introduced over the wire the occluded segment of the common iliac vein was angioplastied. Repeat venography demonstrates recreation of a small vascular channel. A 10 x 40 mm Conquest balloon was then selected and introduced over the wire. Venography demonstrates successful recanalization of the common iliac  vein. There is flow through the IVC. 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 The 6 French sheath was exchanged in the soft tissue tract dilated to 24 [place]. A 26 French Gore dry seal sheath was then advanced over the wire and positioned in the right common iliac vein. The right internal jugular vein was interrogated with ultrasound and found to be widely patent. An image was obtained and stored for the medical record. Local anesthesia was attained by infiltration with 1% lidocaine. A small dermatotomy was made. Under real-time sonographic guidance, the vessel was punctured with a 21 gauge micropuncture  needle. Using standard technique, the initial micro needle was exchanged over a 0.018 micro wire for a transitional 4 [place] micro sheath. The micro sheath was then exchanged over a 0.035 wire for a 6 French vascular sheath. A superstiff Amplatz wire was advanced into the suprarenal IVC. The skin tract was then serially dilated and an 67 French reperfusion catheter was advanced and position with the tip at the cavoatrial junction. At this time, the patient was fully heparinized until the ACT level was greater than 300 and the AngioVac circuit was established. The angio [name] device was advanced through the 36 French sheath from the right common femoral approach and suction thrombectomy was performed throughout the infrarenal inferior vena cava. Working from the popliteal access, additional left lower extremity venography images were performed focusing on the iliac and common femoral vasculature. Thrombus is present within the common femoral vein and the external iliac vein. The left common iliac vein is completely occluded similar to the other side. Attempts were made to pass through the occluded left common iliac vein from below. This was unsuccessful. Therefore, a second right internal jugular venous access was required. The right internal jugular vein was was  again interrogated with ultrasound and found to be widely patent. An image was obtained and stored for the medical record. Local anesthesia was attained by infiltration with 1% lidocaine. A small dermatotomy was made. Under real-time sonographic guidance, the vessel was punctured with a 21 gauge micropuncture needle. Using standard technique, the initial micro needle was exchanged over a 0.018 micro wire for a transitional 4 [place] micro sheath. The micro sheath was then exchanged over a 0.035 wire for a 6 French vascular sheath. An angled catheter and glidewire were advanced through the sheath, through the IVC filter, through the infrarenal IVC and the catheter successfully navigated across the chronically occluded left common iliac vein. The glidewire was then exchanged for an exchange length Glidewire and snared from the femoral vein in the midthigh and brought through the popliteal sheath access resulting in successful through and through access from the right internal jugular vein through the left popliteal vein. A catheter was then advanced over the wire into the femoral vein and the glidewire exchanged for a superstiff Amplatz wire. At this time, another cavagram was performed demonstrating successful debulking of the caval thrombus. At this point it was decided to proceed with removal of the IVC filter. Over the superstiff Amplatz wire from the right jugular vein approach, the skin tract was dilated and a second 26 [place] Gore dry seal sheath was advanced and positioned in the suprarenal IVC. A Bard IVC filter retrieval set was then advanced coaxially through this device next to the Amplatz wire. The Bard [place] IVC filter was successfully retrieved using standard technique. Additional thrombectomy was then performed using the angio VAC device. This yielded significant material. Repeat vena cavagram demonstrates complete removal of thrombus from the inferior vena cava. Attention was next turned to the  occluded left common iliac artery. The perfusion circuit was reversed and the reperfusion catheter was placed in the right femoral sheath and the angio back device was advanced through the right IJ sheath. Suction thrombectomy was performed in the right common iliac vein. Working from the popliteal access, angioplasty was performed of the common femoral, external iliac and common iliac veins and thrombus was pushed into the AngioVac. There appears to be a significant stenosis at the transition from the common iliac to the external iliac vein. This was resistant to  initial angioplasty, so a 14 x 60 mm wall stent was selected, placed across the lesion and deployed. The wall stent was then post dilated to 12 mm using the 12 by 40 Conquest balloon. Catheter venography demonstrates significant near occlusive thrombus throughout the common femoral vein, and femoral vein in the thigh. Therefore, the decision was made to proceed with additional thrombectomy from the popliteal access using the [facility]. The 6 French left popliteal sheath was exchanged over the wire for a 9 French sheath. The is a long take catheter was advanced over the wire and real lytic thrombectomy performed throughout the femoral, common femoral and iliac veins. Follow-up angiography demonstrates significant improvement with restoration of in-line flow. There is a significant residual stenosis at the common femoral vein. This was angioplastied to 12 mm using the 12 x 40 mm Conquest balloon. On the final angiographic runs, there is successful restoration of in-line flow from the popliteal access through the IVC as well as successful recanalization of both iliac veins. All wires and devices were removed. Once the patient's ACT drop below 200, the sheaths were removed. Hemostasis was attained with a combination of manual pressure and pursestring sutures at both 26 [place] sheath sites. FINDINGS: 1. Chronic occlusion of the right  common iliac vein 2. Significant stenosis at the transition from the left common to the left external iliac vein 3. 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 Successful recanalization and angioplasty of the chronically occluded right common iliac vein 2. Successful thrombolysis/thrombectomy of the inferior vena cava, left-sided iliac veins and left lower extremity deep venous system to the level of the popliteal vein. 3. Stent assisted angioplasty of a flow limiting stenosis at the interface of the left common and external iliac veins. 4. Angioplasty of a stenotic segment of the left common femoral vein. 5. The IVC filter was retrieved.  No additional filter was placed. PLAN: 1. Continue full-dose heparin and transitioned to weight based Lovenox prior to discharge. 2. Progress toward ambulation as soon as clinically possible. 3. Compression garment or Ace wraps to the left lower extremity if needed to facilitate edema reduction. 4. Follow-up with Dr. Laurence Ferrari in interventional radiology clinic in 2 weeks. 5. If patient demonstrates any evidence of bleeding and needs to come off of his anticoagulation in the future, he must have an IVC filter replaced. Signed, Criselda Peaches, MD [facility] Electronically Signed   By: Jacqulynn Cadet M.D.   On: 01/07/2018 10:46   Ir [facility] Stent  Initial Vein  Inc Angioplasty  Result Date: 01/07/2018 INDICATION: 57 year-old male with metastatic colon cancer an acute severe iliocaval and left lower extremity DVT. He presents for thrombectomy. EXAM: 1. Ultrasound-guided vascular access left popliteal vein 2. Ultrasound-guided access right common femoral vein 3. Ultrasound-guided access right internal jugular vein 4. Second ultrasound-guided access right internal jugular vein 5. Left lower extremity venogram 6. Right lower extremity venogram 7. Inferior vena cavogram 8. Combined  pharmacomechanical thrombolysis/thrombectomy using tPA, the AngioVac device, and the Angiojet device 9. IVC filter retrieval 10. Recanalization and balloon angioplasty of the right common iliac vein 11. Recanalization and angioplasty with subsequent stent placement in the left iliac vein 12. Balloon angioplasty of the left common femoral and femoral veins Operating Physicians: Corrie Mckusick, DO and Jacqulynn Cadet, MD COMPARISON:  CT scan 12/30/2017 MEDICATIONS: 12 mg tPA administered directly into thrombus throughout the course of the procedure ANESTHESIA/SEDATION: General anesthesia provided  by the anesthesiology service FLUOROSCOPY TIME:  Fluoroscopy Time: 67 minutes 6 seconds (816.6 mGy). COMPLICATIONS: None immediate. TECHNIQUE: Informed written consent was obtained from the patient after a thorough discussion of the procedural risks, benefits and alternatives. All questions were addressed. Maximal Sterile Barrier Technique was utilized including caps, mask, sterile gowns, sterile gloves, sterile drape, hand hygiene and skin antiseptic. A timeout was performed prior to the initiation of the procedure. The patient was initially positioned prone. The left popliteal vein was interrogated with ultrasound and found to be widely patent. An image was obtained and stored for the medical record. Local anesthesia was attained by infiltration with 1% lidocaine. A small dermatotomy was made. Under real-time sonographic guidance, the vessel was punctured with a 21 gauge micropuncture needle. Using standard technique, the initial micro needle was exchanged over a 0.018 micro wire for a transitional 4 [place] micro sheath. The micro sheath was then exchanged over a 0.035 wire for a 45 cm 6 [place] braided Arrow sheath. A left lower extremity venogram was performed. The femoral vein is completely occluded in the thigh. The popliteal vein remains patent. 6 mg tPA was laced throughout the thrombus within the common and femoral  veins. The patient was repositioned supine and intubated. The right common femoral was interrogated with ultrasound and found to be patent with changes of chronic DVT. An image was obtained and stored for the medical record. Local anesthesia was attained by infiltration with 1% lidocaine. A small dermatotomy was made. Under real-time sonographic guidance, the vessel was punctured with a 21 gauge micropuncture needle. Using standard technique, the initial micro needle was exchanged over a 0.018 micro wire for a transitional 4 [place] micro sheath. The micro sheath was then exchanged over a 0.035 wire for a 6 French vascular sheath. Right lower extremity venography was performed. The common femoral and external iliac veins are patent. 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 All venous drainage is via parallel spinous collaterals. An angled catheter and glidewire were introduced through the sheath. With some manipulation, the catheter was successfully advanced into the suprarenal IVC. An inferior vena cavagram was performed confirming patency of both renal veins and the super renal IVC. A superstiff Amplatz wire was advanced into the right heart. A 10 x 40 mm Conquest balloon was introduced over the wire the occluded segment of the common iliac vein was angioplastied. Repeat venography demonstrates recreation of a small vascular channel. A 10 x 40 mm Conquest balloon was then selected and introduced over the wire. Venography demonstrates successful recanalization of the common iliac vein. There is flow through the IVC. 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 The 6 French sheath was exchanged in the soft tissue tract dilated to 24 [place]. A 26 French Gore dry seal sheath was then advanced over the wire and positioned in the right common iliac vein. The right internal jugular vein was interrogated with ultrasound and found to be widely patent. An image was  obtained and stored for the medical record. Local anesthesia was attained by infiltration with 1% lidocaine. A small dermatotomy was made. Under real-time sonographic guidance, the vessel was punctured with a 21 gauge micropuncture needle. Using standard technique, the initial micro needle was exchanged over a 0.018 micro wire for a transitional 4 [place] micro sheath. The micro sheath was then exchanged over a 0.035 wire for a 6 French vascular sheath. A superstiff Amplatz wire was advanced into the suprarenal IVC. The skin tract  was then serially dilated and an 54 French reperfusion catheter was advanced and position with the tip at the cavoatrial junction. At this time, the patient was fully heparinized until the ACT level was greater than 300 and the AngioVac circuit was established. The angio [facility] device was advanced through the 43 French sheath from the right common femoral approach and suction thrombectomy was performed throughout the infrarenal inferior vena cava. Working from the popliteal access, additional left lower extremity venography images were performed focusing on the iliac and common femoral vasculature. Thrombus is present within the common femoral vein and the external iliac vein. The left common iliac vein is completely occluded similar to the other side. Attempts were made to pass through the occluded left common iliac vein from below. This was unsuccessful. Therefore, a second right internal jugular venous access was required. The right internal jugular vein was was again interrogated with ultrasound and found to be widely patent. An image was obtained and stored for the medical record. Local anesthesia was attained by infiltration with 1% lidocaine. A small dermatotomy was made. Under real-time sonographic guidance, the vessel was punctured with a 21 gauge micropuncture needle. Using standard technique, the initial micro needle was exchanged over a 0.018 micro wire for a transitional 4 [place]  micro sheath. The micro sheath was then exchanged over a 0.035 wire for a 6 French vascular sheath. An angled catheter and glidewire were advanced through the sheath, through the IVC filter, through the infrarenal IVC and the catheter successfully navigated across the chronically occluded left common iliac vein. The glidewire was then exchanged for an exchange length Glidewire and snared from the femoral vein in the midthigh and brought through the popliteal sheath access resulting in successful through and through access from the right internal jugular vein through the left popliteal vein. A catheter was then advanced over the wire into the femoral vein and the glidewire exchanged for a superstiff Amplatz wire. At this time, another cavagram was performed demonstrating successful debulking of the caval thrombus. At this point it was decided to proceed with removal of the IVC filter. Over the superstiff Amplatz wire from the right jugular vein approach, the skin tract was dilated and a second 26 [place] Gore dry seal sheath was advanced and positioned in the suprarenal IVC. A Bard IVC filter retrieval set was then advanced coaxially through this device next to the Amplatz wire. The Bard [place] IVC filter was successfully retrieved using standard technique. Additional thrombectomy was then performed using the angio VAC device. This yielded significant material. Repeat vena cavagram demonstrates complete removal of thrombus from the inferior vena cava. Attention was next turned to the occluded left common iliac artery. The perfusion circuit was reversed and the reperfusion catheter was placed in the right femoral sheath and the angio back device was advanced through the right IJ sheath. Suction thrombectomy was performed in the right common iliac vein. Working from the popliteal access, angioplasty was performed of the common femoral, external iliac and common iliac veins and thrombus was pushed into the AngioVac.  There appears to be a significant stenosis at the transition from the common iliac to the external iliac vein. This was resistant to initial angioplasty, so a 14 x 60 mm wall stent was selected, placed across the lesion and deployed. The wall stent was then post dilated to 12 mm using the 12 by 40 Conquest balloon. Catheter venography demonstrates significant near occlusive thrombus throughout the common femoral vein, and femoral vein in  the thigh. Therefore, the decision was made to proceed with additional thrombectomy from the popliteal access using the [name]. The 6 French left popliteal sheath was exchanged over the wire for a 9 French sheath. The is a long take catheter was advanced over the wire and real lytic thrombectomy performed throughout the femoral, common femoral and iliac veins. Follow-up angiography demonstrates significant improvement with restoration of in-line flow. There is a significant residual stenosis at the common femoral vein. This was angioplastied to 12 mm using the 12 x 40 mm Conquest balloon. On the final angiographic runs, there is successful restoration of in-line flow from the popliteal access through the IVC as well as successful recanalization of both iliac veins. All wires and devices were removed. Once the patient's ACT drop below 200, the sheaths were removed. Hemostasis was attained with a combination of manual pressure and pursestring sutures at both 26 [place] sheath sites. 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 Significant stenosis at the transition from the left common to the left external iliac vein 3. 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 Successful recanalization and angioplasty of the chronically occluded right common iliac vein 2. Successful thrombolysis/thrombectomy of the inferior vena cava, left-sided iliac veins and left lower extremity deep venous system to the level of the popliteal  vein. 3. Stent assisted angioplasty of a flow limiting stenosis at the interface of the left common and external iliac veins. 4. Angioplasty of a stenotic segment of the left common femoral vein. 5. The IVC filter was retrieved.  No additional filter was placed. PLAN: 1. Continue full-dose heparin and transitioned to weight based Lovenox prior to discharge. 2. Progress toward ambulation as soon as clinically possible. 3. Compression garment or Ace wraps to the left lower extremity if needed to facilitate edema reduction. 4. Follow-up with Dr. Laurence Ferrari in interventional radiology clinic in 2 weeks. 5. If patient demonstrates any evidence of bleeding and needs to come off of his anticoagulation in the future, he must have an IVC filter replaced. Signed, Criselda Peaches, MD [facility] Radiology Electronically Signed   By: Jacqulynn Cadet M.D.   On: 01/07/2018 10:46   Ir Ivc Filter Retrieval / S&i /img Guid/mod Sed  Result Date: 01/07/2018 INDICATION: 57 year-old male with metastatic colon cancer an acute severe iliocaval and left lower extremity DVT. He presents for thrombectomy. EXAM: 1. Ultrasound-guided vascular access left popliteal vein 2. Ultrasound-guided access right common femoral vein 3. Ultrasound-guided access right internal jugular vein 4. Second ultrasound-guided access right internal jugular vein 5. Left lower extremity venogram 6. Right lower extremity venogram 7. Inferior vena cavogram 8. Combined pharmacomechanical thrombolysis/thrombectomy using tPA, the AngioVac device, and the Angiojet device 9. IVC filter retrieval 10. Recanalization and balloon angioplasty of the right common iliac vein 11. Recanalization and angioplasty with subsequent stent placement in the left iliac vein 12. Balloon angioplasty of the left common femoral and femoral veins Operating Physicians: Corrie Mckusick, DO and Jacqulynn Cadet, MD COMPARISON:  CT scan  12/30/2017 MEDICATIONS: 12 mg tPA administered directly into thrombus throughout the course of the procedure ANESTHESIA/SEDATION: General anesthesia provided by the anesthesiology service FLUOROSCOPY TIME:  Fluoroscopy Time: 67 minutes 6 seconds (816.6 mGy). COMPLICATIONS: None immediate. TECHNIQUE: Informed written consent was obtained from the patient after a thorough discussion of the procedural risks, benefits and alternatives. All questions were addressed. Maximal Sterile Barrier Technique was utilized including caps, mask, sterile  gowns, sterile gloves, sterile drape, hand hygiene and skin antiseptic. A timeout was performed prior to the initiation of the procedure. The patient was initially positioned prone. The left popliteal vein was interrogated with ultrasound and found to be widely patent. An image was obtained and stored for the medical record. Local anesthesia was attained by infiltration with 1% lidocaine. A small dermatotomy was made. Under real-time sonographic guidance, the vessel was punctured with a 21 gauge micropuncture needle. Using standard technique, the initial micro needle was exchanged over a 0.018 micro wire for a transitional 4 [place] micro sheath. The micro sheath was then exchanged over a 0.035 wire for a 45 cm 6 [place] braided Arrow sheath. A left lower extremity venogram was performed. The femoral vein is completely occluded in the thigh. The popliteal vein remains patent. 6 mg tPA was laced throughout the thrombus within the common and femoral veins. The patient was repositioned supine and intubated. The right common femoral was interrogated with ultrasound and found to be patent with changes of chronic DVT. An image was obtained and stored for the medical record. Local anesthesia was attained by infiltration with 1% lidocaine. A small dermatotomy was made. Under real-time sonographic guidance, the vessel was punctured with a 21 gauge micropuncture needle. Using standard technique,  the initial micro needle was exchanged over a 0.018 micro wire for a transitional 4 [place] micro sheath. The micro sheath was then exchanged over a 0.035 wire for a 6 French vascular sheath. Right lower extremity venography was performed. The common femoral and external iliac veins are patent. 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 All venous drainage is via parallel spinous collaterals. An angled catheter and glidewire were introduced through the sheath. With some manipulation, the catheter was successfully advanced into the suprarenal IVC. An inferior vena cavagram was performed confirming patency of both renal veins and the super renal IVC. A superstiff Amplatz wire was advanced into the right heart. A 10 x 40 mm Conquest balloon was introduced over the wire the occluded segment of the common iliac vein was angioplastied. Repeat venography demonstrates recreation of a small vascular channel. A 10 x 40 mm Conquest balloon was then selected and introduced over the wire. Venography demonstrates successful recanalization of the common iliac vein. There is flow through the IVC. 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 The 6 French sheath was exchanged in the soft tissue tract dilated to 24 [place]. A 26 French Gore dry seal sheath was then advanced over the wire and positioned in the right common iliac vein. The right internal jugular vein was interrogated with ultrasound and found to be widely patent. An image was obtained and stored for the medical record. Local anesthesia was attained by infiltration with 1% lidocaine. A small dermatotomy was made. Under real-time sonographic guidance, the vessel was punctured with a 21 gauge micropuncture needle. Using standard technique, the initial micro needle was exchanged over a 0.018 micro wire for a transitional 4 [place] micro sheath. The micro sheath was then exchanged over a 0.035 wire for a 6 French vascular  sheath. A superstiff Amplatz wire was advanced into the suprarenal IVC. The skin tract was then serially dilated and an 56 French reperfusion catheter was advanced and position with the tip at the cavoatrial junction. At this time, the patient was fully heparinized until the ACT level was greater than 300 and the AngioVac circuit was established. The angio [facility] device was advanced through the 26  French sheath from the right common femoral approach and suction thrombectomy was performed throughout the infrarenal inferior vena cava. Working from the popliteal access, additional left lower extremity venography images were performed focusing on the iliac and common femoral vasculature. Thrombus is present within the common femoral vein and the external iliac vein. The left common iliac vein is completely occluded similar to the other side. Attempts were made to pass through the occluded left common iliac vein from below. This was unsuccessful. Therefore, a second right internal jugular venous access was required. The right internal jugular vein was was again interrogated with ultrasound and found to be widely patent. An image was obtained and stored for the medical record. Local anesthesia was attained by infiltration with 1% lidocaine. A small dermatotomy was made. Under real-time sonographic guidance, the vessel was punctured with a 21 gauge micropuncture needle. Using standard technique, the initial micro needle was exchanged over a 0.018 micro wire for a transitional 4 [place] micro sheath. The micro sheath was then exchanged over a 0.035 wire for a 6 French vascular sheath. An angled catheter and glidewire were advanced through the sheath, through the IVC filter, through the infrarenal IVC and the catheter successfully navigated across the chronically occluded left common iliac vein. The glidewire was then exchanged for an exchange length Glidewire and snared from the femoral vein in the midthigh and brought through  the popliteal sheath access resulting in successful through and through access from the right internal jugular vein through the left popliteal vein. A catheter was then advanced over the wire into the femoral vein and the glidewire exchanged for a superstiff Amplatz wire. At this time, another cavagram was performed demonstrating successful debulking of the caval thrombus. At this point it was decided to proceed with removal of the IVC filter. Over the superstiff Amplatz wire from the right jugular vein approach, the skin tract was dilated and a second 26 [place] Gore dry seal sheath was advanced and positioned in the suprarenal IVC. A Bard IVC filter retrieval set was then advanced coaxially through this device next to the Amplatz wire. The Bard [place] IVC filter was successfully retrieved using standard technique. Additional thrombectomy was then performed using the angio VAC device. This yielded significant material. Repeat vena cavagram demonstrates complete removal of thrombus from the inferior vena cava. Attention was next turned to the occluded left common iliac artery. The perfusion circuit was reversed and the reperfusion catheter was placed in the right femoral sheath and the angio back device was advanced through the right IJ sheath. Suction thrombectomy was performed in the right common iliac vein. Working from the popliteal access, angioplasty was performed of the common femoral, external iliac and common iliac veins and thrombus was pushed into the AngioVac. There appears to be a significant stenosis at the transition from the common iliac to the external iliac vein. This was resistant to initial angioplasty, so a 14 x 60 mm wall stent was selected, placed across the lesion and deployed. The wall stent was then post dilated to 12 mm using the 12 by 40 Conquest balloon. Catheter venography demonstrates significant near occlusive thrombus throughout the common femoral vein, and femoral vein in the thigh.  Therefore, the decision was made to proceed with additional thrombectomy from the popliteal access using the [facility]. The 6 French left popliteal sheath was exchanged over the wire for a 9 French sheath. The is a long take catheter was advanced over the wire and real lytic  thrombectomy performed throughout the femoral, common femoral and iliac veins. Follow-up angiography demonstrates significant improvement with restoration of in-line flow. There is a significant residual stenosis at the common femoral vein. This was angioplastied to 12 mm using the 12 x 40 mm Conquest balloon. On the final angiographic runs, there is successful restoration of in-line flow from the popliteal access through the IVC as well as successful recanalization of both iliac veins. All wires and devices were removed. Once the patient's ACT drop below 200, the sheaths were removed. Hemostasis was attained with a combination of manual pressure and pursestring sutures at both 26 [place] sheath sites. 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 Significant stenosis at the transition from the left common to the left external iliac vein 3. 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 Successful recanalization and angioplasty of the chronically occluded right common iliac vein 2. Successful thrombolysis/thrombectomy of the inferior vena cava, left-sided iliac veins and left lower extremity deep venous system to the level of the popliteal vein. 3. Stent assisted angioplasty of a flow limiting stenosis at the interface of the left common and external iliac veins. 4. Angioplasty of a stenotic segment of the left common femoral vein. 5. The IVC filter was retrieved.  No additional filter was placed. PLAN: 1. Continue full-dose heparin and transitioned to weight based Lovenox prior to discharge. 2. Progress toward ambulation as soon as clinically possible. 3. Compression garment or Ace wraps  to the left lower extremity if needed to facilitate edema reduction. 4. Follow-up with Dr. Laurence Ferrari in interventional radiology clinic in 2 weeks. 5. If patient demonstrates any evidence of bleeding and needs to come off of his anticoagulation in the future, he must have an IVC filter replaced. Signed, Criselda Peaches, MD [name] Rad[facility] Electronically Signed   By: Jacqulynn Cadet M.D.   On: 01/07/2018 10:46    Labs:  CBC: Recent Labs    01/05/18 0601 01/06/18 0749 01/06/18 1503 01/06/18 2311 01/07/18 0351  WBC 5.5 5.4  --  5.0 6.7  HGB 8.4* 7.8* 10.5* 7.9* 7.5*  HCT 26.3* 25.8* 31.0* 23.9* 23.1*  PLT 180 160  --  115* 128*    COAGS: Recent Labs    01/29/17 1256 07/11/17 0340  08/12/17 0527 08/19/17 1047  01/02/18 1815 01/03/18 0158 01/03/18 0829 01/04/18 0615  INR 1.07 1.27  --  1.02 1.77  --   --   --   --   --   APTT 31  --    < >  --   --    < > 44* 72* 82* 91*   < > = values in this interval not displayed.    BMP: Recent Labs    01/01/18 0528 01/02/18 0537 01/06/18 1503 01/06/18 2311 01/07/18 0351  NA 135 132* 138 134* 136  K 4.0 3.5 3.8 4.4 4.5  CL 101 99*  --  102 104  CO2 28 26  --  23 24  GLUCOSE 106* 101* 111* 151* 136*  BUN 10 8  --  9 11  CALCIUM 8.2* 8.1*  --  7.5* 7.7*  CREATININE 0.78 0.69  --  0.73 0.80  GFRNONAA >60 >60  --  >60 >60  GFRAA >60 >60  --  >60 >60    LIVER FUNCTION TESTS: Recent Labs    11/29/17 0950 12/13/17 0835 12/27/17 0820 12/30/17 1907  BILITOT 0.34 0.31 0.52 0.7  AST  21 29 16 22   ALT 9 10 6  8*  ALKPHOS 94 95 69 66  PROT 7.9 8.2 7.8 8.4*  ALBUMIN 3.6 3.7 3.0* 3.2*    Assessment and Plan:  Acute iliocaval thrombosis, with extension into the left CFV and femoral vein, secondary to filter and hx of malignancy.   S/P IVC filter retrieval AngioVac aspiration thrombectomy of IVC and Iliac thrombus Balloon angioplasty and stenting of chronic left  external iliac vein occlusion Balloon angioplasty for maceration of LLE thrombus AngioJet aspiration thrombectomy of the L femoral, CFV, and iliac system for DVT treatment By Dr. Laurence Ferrari and Dr. Earleen Newport 01/07/2018  Continue anticoagulation due to hypercoagulable state.  Will remove the purse string sutures from Right neck and right groin tomorrow.  Electronically Signed: Murrell Redden, PA-C 01/07/2018, 10:49 AM   I spent a total of 15 Minutes at the the patient's bedside AND on the patient's [name] floor or unit, greater than 50% of which was counseling/coordinating care for f/u after AngioVac procedure for caval thrombosis.

## 2018-01-07 NOTE — Plan of Care (Signed)
Progressing with goals. Extubation  early am, aline out, eating meals and tolerating. Elimination, ostomy red drainage, small amount. Continue to watch heparin levels. Will need teaching on sq anticoagulation for home.

## 2018-01-07 NOTE — Progress Notes (Signed)
Reginal Lutes MD after noting red urine in Foley bag, red GI contents in colostomy bag, red sputum in ET tube suction canister, and bleeding out of right wrist PIV when disconnected. Will wait until AM labs are resulted to determine about heparin gtt. Will continue to monitor patient closely.

## 2018-01-07 NOTE — Progress Notes (Signed)
Patient transferred from [facility]. Alert and oriented. HR in the 130's. Temp[ 100.2. Denies pain. Telemetry applied and verified by two staff. Skin assessment done times 2 RN. Oriented to room and surroundings.

## 2018-01-07 NOTE — Progress Notes (Signed)
eLink Physician-Brief Progress Note Patient Name: Jeff [name] [name]: 12-07-1961 MRN: [id number]   Date of Service  01/07/2018  HPI/Events of Note  Mg low.  eICU Interventions  Replaced IV.        [name] 01/07/2018, 1:46 AM

## 2018-01-07 NOTE — Progress Notes (Signed)
Report called to 4E.

## 2018-01-07 NOTE — Progress Notes (Signed)
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 Admitted 12/31/17 for worsening LE DVT . CT scan had shown extensive thrombosis in the IVC, common iliac, left iliac vein, and femoral vein. MRI brain does not show any brain mets. He underwent thrombectomy today. Plan is to continue anticoagulation after the procedure. We were consulted for overnight vent management.   VITAL SIGNS: BP 91/65   Pulse 75   Temp 98.2 F (36.8 C) (Oral)   Resp (!) 26   Ht 5\' 9"  (1.753 m)   Wt 63.3 kg (139 lb 8.8 oz)   SpO2 100%   BMI 20.61 kg/m   HEMODYNAMICS:    VENTILATOR SETTINGS: Vent Mode: PSV;CPAP FiO2 (%):  [40 %-60 %] 40 % Set Rate:  [12 bmp-16 bmp] 12 bmp Vt Set:  [560 mL] 560 mL PEEP:  [5 cmH20] 5 cmH20 Pressure Support:  [5 cmH20] 5 cmH20 Plateau Pressure:  [12 cmH20-15 cmH20] 15 cmH20 currently on pressure support of 5 of PEEP with a spontaneous tidal volume of 800 cc and a rate greater than 15  INTAKE / OUTPUT: I/O last 3 completed shifts: In: 4162.1 [P.O.:120; I.V.:3362.1; Blood:630; IV Piggyback:50] Out: 2810 [Urine:2160; Stool:400; Blood:250]  PHYSICAL EXAMINATION: General: Frail cachectic male no acute distress awake alert endotracheal tube to ventilator orogastric tube HEENT: MM pink/moist PSY: None available Neuro: Awake alert follows commands CV: Heart sounds are regular PULM: Mild rhonchi WU:JWJX, non-tender, bsx4 active  Extremities: warm/dry, negative edema  Skin: no rashes or lesions  LABS:  BMET Recent Labs   Lab 01/02/18 0537 01/06/18 1503 01/06/18 2311 01/07/18 0351  NA 132* 138 134* 136  K 3.5 3.8 4.4 4.5  CL 99*  --  102 104  CO2 26  --  23 24  BUN 8  --  9 11  CREATININE 0.69  --  0.73 0.80  GLUCOSE 101* 111* 151* 136*    Electrolytes Recent Labs  Lab 01/01/18 0528 01/02/18 0537 01/06/18 2311 01/07/18 0351  CALCIUM 8.2* 8.1* 7.5* 7.7*  MG 1.1* 0.9* 0.9*  --   PHOS  --   --  3.7  --     CBC Recent Labs  Lab 01/06/18 0749 01/06/18 1503 01/06/18 2311 01/07/18 0351  WBC 5.4  --  5.0 6.7  HGB 7.8* 10.5* 7.9* 7.5*  HCT 25.8* 31.0* 23.9* 23.1*  PLT 160  --  115* 128*    Coag's Recent Labs  Lab 01/03/18 0158 01/03/18 0829 01/04/18 0615  APTT 72* 82* 91*    Sepsis Markers Recent Labs  Lab 01/06/18 2311  LATICACIDVEN 1.2    ABG Recent Labs  Lab 01/06/18 2105 01/07/18 0001  PHART 7.514* 7.495*  PCO2ART 30.8* 33.7  PO2ART 326* 201*    Liver Enzymes No results for input(s): AST, ALT, ALKPHOS, BILITOT, ALBUMIN in the last 168 hours.  Cardiac Enzymes No results for input(s): TROPONINI, PROBNP in the last 168 hours.  Glucose Recent Labs  Lab 01/06/18 2205  GLUCAP 138*  Imaging Dg Chest Port 1 View  Result Date: 01/07/2018 CLINICAL DATA:  Respiratory failure EXAM: PORTABLE CHEST 1 VIEW COMPARISON:  None. FINDINGS: The heart size and mediastinal contours are within normal limits. Both lungs are clear. Endotracheal tube tip is 4.3 cm above the carina in satisfactory position. Port catheter tip is noted in the distal SVC. No effusion or pneumothorax. The visualized skeletal structures are unremarkable. IMPRESSION: Satisfactory support line and tubes.  Clear lungs. Electronically Signed   By: Ashley Royalty M.D.   On: 01/07/2018 03:30     STUDIES:    CULTURES:   ANTIBIOTICS:   SIGNIFICANT EVENTS: 1/10 Thrombectomy    LINES/TUBES: ETT 1/10 >> 01/07/2018  DISCUSSION:  57 yo patient with with metastatic colon carcinoma on palliative  chemotherapy,  Anemia of chronic disease, who presented with acute left lower extremity DVT, underwent a thrombectomy today and transferred to ICU overnight for vent. 01/07/2017 awake alert no acute distress hemodynamically stable plan for extubation.  If he tolerates extubation well he will be ready to move to stepdown unit arterial floor and transition to the [facility] hospitalist service.   ASSESSMENT / PLAN:  PULMONARY A: Post-op on ventilator  Acute left lower extremity DVT - on IV heparin prior to thrombectomy, and pt has hypercoagulable state.   P:    Vent settings: PRVC, Tidal volume 560, rate of 16, FiO2 of 60, pressure support of 5   01/07/2018 meets criteria for extubation will proceed with extubation    CARDIOVASCULAR A:  S/p IVC filter retrieval AngioVac aspiration thrombectomy of IVC and Iliac thrombus Balloon angioplasty and stenting of chronic left external iliac vein occlusion Balloon angioplasty for maceration of LLE thrombus AngioJet aspiration thrombectomy of the L femoral, CFV, and iliac system for DVT treatment Per interventional radiology  P:  -goal normotensive, rest of the plan as below Per IR: - Maintain sterile dressings with pressure at the right IJ and right CFV access sites.  VIR will follow up tomorrow - Routine wound care. - Starting back 1000U per hour heparin, with hep level/PTT check in 2 hours - Labs in am - Observing hemoglobinuria at the conclusion of case, which is expected after Angiojet thrombolysis.  Will run Saline 150cc/hr x 8 hours. 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  P:   RASS goal: 0, DC all sedation and prep for extubation.   FAMILY  - Updates: Patient updated at bedside  - Inter-disciplinary family meet or Palliative Care meeting due by: 1/15   Richardson Landry Minor ACNP Maryanna Shape PCCM Pager [phone] till 1 pm If no answer page [phone] 01/07/2018, 8:31 AM  Attending Note:  57 year old male with colon cancer history who presents to the hospital with extensive DVTs and GI bleeding.  Patient was admitted by IR and was intubated for procedure.  Patient is weaning very well on exam this AM and tolerating FiO2 of 30%.  I reviewed CXR myself, ETT is in a good position.  Will proceed with extubation today.  Hg is relatively stable while on heparin.  If bleeding occurs may need to communicate with GI.  Hold off ambulation today per IR recommendations.  Transfer patient to SDU.  Start diet.  Continue protonix BID IV.  PCCM will sign off, please call back if needed.  The patient is critically ill with multiple organ systems failure and requires high complexity decision making for assessment and support, frequent evaluation  and titration of therapies, application of advanced monitoring technologies and extensive interpretation of multiple databases.   Critical Care Time devoted to patient care services described in this note is  35  Minutes. This time reflects time of care of this signee Dr Jennet Maduro. This critical care time does not reflect procedure time, or teaching time or supervisory time of PA/NP/Med student/Med Resident etc but could involve care discussion time.  Rush Farmer, M.D. [facility] Pulmonary/Critical Care Medicine. Pager: [phone]. After hours pager: [phone].

## 2018-01-07 NOTE — Progress Notes (Signed)
ANTICOAGULATION CONSULT NOTE - Follow-Up Consult  Pharmacy Consult for heparin Indication: DVT  No Known Allergies  Patient Measurements: Height: 5\' 9"  (175.3 cm) Weight: 139 lb 8.8 oz (63.3 kg) IBW/kg (Calculated) : 70.7 Heparin Dosing Weight: 59.4 kg  Vital Signs: Temp: 100 F (37.8 C) (01/11 2016) Temp Source: Oral (01/11 2016) BP: 113/65 (01/11 2000) Pulse Rate: 124 (01/11 2000)  Labs: Recent Labs    01/06/18 0749 01/06/18 1503 01/06/18 2311 01/07/18 0351 01/07/18 0837 01/07/18 1949  HGB 7.8* 10.5* 7.9* 7.5*  --   --   HCT 25.8* 31.0* 23.9* 23.1*  --   --   PLT 160  --  115* 128*  --   --   HEPARINUNFRC 0.33  --  0.72* 0.55 0.16* <0.10*  CREATININE  --   --  0.73 0.80  --   --     Estimated Creatinine Clearance: 90.1 mL/min (by C-G formula based on SCr of 0.8 mg/dL).  Assessment: 57 yo M with new DVT.  Pharmacy consulted to dose low dose heparin with no bolus after pt experienced a nosebleed with Eliquis.  LLE acute DVT diagnosed 1/3, CT scan showed extensive thrombosis in the IVC, common iliac, left iliac vein and femoral vein.  Pt has history of GI bleeding while on Xarelto and also reported a nosebleed while on Eliquis.  Last dose of Eliquis was 1/4 evening.  Pt continues on heparin in the interim.  MD request NO BOLUS and low goal.   Heparin level is subtherapeutic at <0.1 on 800 units/hr; order for rate adjustment earlier this afternoon was signed and held due to a phase of care attached to order but was not released.  Goal of Therapy:  Heparin level 0.2 - 0.5 - low goal per MD request Monitor platelets by anticoagulation protocol: Yes   Plan:  Increase heparin drip to 950 units/hr Heparin level with am labs Daily heparin level, CBC    Renold Genta, PharmD, BCPS Clinical Pharmacist Phone for today - [place] - [phone] 01/07/2018 9:01 PM

## 2018-01-07 NOTE — Progress Notes (Signed)
PROGRESS NOTE  Jeff [name] TFT:[id number] [name]: [birth year] [name]: 12/30/2017 PCP: [name], MD  HPI/Recap of past 24 hours: Jeff [name] is a 57 y.o. 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 Iimaging studies notable for worsening left LE DVT. Initially hesitant to start anticoagulation due to known history of GI bleed while on Xarelto. Started on Eliquis and transferred to [facility] for thrombectomy. Post op #1. Transferred to ICU for vent management. Extubated today 01/07/18 and breathing well on ambient air.  Patient seen and examined at his bedside. Has no complaints.  Assessment/Plan: Principal Problem:   Evaluation by psychiatric service required Active Problems:   DVT (deep venous thrombosis) (HCC)   Malignant neoplasm of colon ([place])   Palliative care by specialist   Advance care planning   DVT, bilateral lower limbs ([place])  Left lowerextremity DVT, acute post thrombectomy POD#1 by IR -Continue heparin drip -Transferred from [facility] hospital to [facility] on 01/05/18 -CT scan showed extensive thrombosis in the IVC, common iliac, left iliac vein and femoral vein -prior history of GI bleeding from his left colon mass when he was onmaintenance Xarelto,he is at high risk for recurrent bleeding from anticoagulation,  -MRI brain without metastatic disease -Pt agrees to continueanticoagulation after procedure, will need Lovenox  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 -on palliative chemotherapy 5-FU and Panitumumab -last chemo session 11/27/2017, follows with Dr Burr Medico  Acute on chronic chronic anemia of chronic disease -anemia in the setting of underlying colon cancer & secondary to chemotherapy, high risk for bleeding and further blood loss and on anticoagulation watch  closely and transfuse as clinically indicated -No evidence of bleeding -hemoglobin 7.5 -CBC am  Moderate Protein and calorie malnutrition with Anorexia -supplements advised dietitian consult requested, c/n Remeron for appetite stimulation   Code Status: full  Family Communication: no family members at bedside   Disposition Plan: will stay another midnight to continue management   Consultants:  IR   CCM for vent management while pt was intubated   Procedures:  Intubation 01/06/18  thombectomy 01/06/18  Antimicrobials:  None  DVT prophylaxis:  heparin drip   Objective: Vitals:   01/07/18 0800 01/07/18 0828 01/07/18 0846 01/07/18 0900  BP: 91/67 99/65  (!) 88/59  Pulse: 89 97  97  Resp: _0 Temp:  98.8 F (37.1 C)    TempSrc:  Oral    SpO2: 100% 100% 100% 100%  Weight:      Height:        Intake/Output Summary (Last 24 hours) at 01/07/2018 1221 Last data filed at 01/07/2018 0600 Gross per 24 hour  Intake 3876.48 ml  Output 2810 ml  Net 1066.48 ml   Filed Weights   01/05/18 1829 01/06/18 0427 01/06/18 2200  Weight: 61.9 kg (136 lb 8 oz) 60.9 kg (134 lb 3.2 oz) 63.3 kg (139 lb 8.8 oz)    Exam:   General:  57 yo AAM WD WN NAD A&o x3  Cardiovascular: RRR no rubs or gallops   Respiratory: CTA no wheezes or rales   Abdomen: colonic pouch on right abd quadrant NBS x4 NT ND  Musculoskeletal: non focal, Trace edema in LE bilaterally   Skin: no open sores  Psychiatry: mood is anxious   Data Reviewed: CBC: Recent Labs  Lab  01/04/18 0615 01/05/18 0601 01/06/18 0749 01/06/18 1503 01/06/18 2311 01/07/18 0351  WBC 5.4 5.5 5.4  --  5.0 6.7  NEUTROABS  --   --   --   --  3.7  --   HGB 8.3* 8.4* 7.8* 10.5* 7.9* 7.5*  HCT 26.5* 26.3* 25.8* 31.0* 23.9* 23.1*  MCV 84.4 84.3 84.6  --  82.7 82.5  PLT 151 180 160  --  115* 938*   Basic Metabolic Panel: Recent Labs  Lab 01/01/18 0528 01/02/18 0537 01/06/18 1503 01/06/18 2311  01/07/18 0351  NA 135 132* 138 134* 136  K 4.0 3.5 3.8 4.4 4.5  CL 101 99*  --  102 104  CO2 28 26  --  23 24  GLUCOSE 106* 101* 111* 151* 136*  BUN 10 8  --  9 11  CREATININE 0.78 0.69  --  0.73 0.80  CALCIUM 8.2* 8.1*  --  7.5* 7.7*  MG 1.1* 0.9*  --  0.9*  --   PHOS  --   --   --  3.7  --    GFR: Estimated Creatinine Clearance: 90.1 mL/min (by C-G formula based on SCr of 0.8 mg/dL). Liver Function Tests: No results for input(s): AST, ALT, ALKPHOS, BILITOT, PROT, ALBUMIN in the last 168 hours. No results for input(s): LIPASE, AMYLASE in the last 168 hours. No results for input(s): AMMONIA in the last 168 hours. Coagulation Profile: No results for input(s): INR, PROTIME in the last 168 hours. Cardiac Enzymes: No results for input(s): CKTOTAL, CKMB, CKMBINDEX, TROPONINI in the last 168 hours. BNP (last 3 results) No results for input(s): PROBNP in the last 8760 hours. HbA1C: No results for input(s): HGBA1C in the last 72 hours. CBG: Recent Labs  Lab 01/06/18 2205  GLUCAP 138*   Lipid Profile: Recent Labs    01/06/18 2311  TRIG 84   Thyroid Function Tests: No results for input(s): TSH, T4TOTAL, FREET4, T3FREE, THYROIDAB in the last 72 hours. Anemia Panel: No results for input(s): VITAMINB12, FOLATE, FERRITIN, TIBC, IRON, RETICCTPCT in the last 72 hours. Urine analysis:    Component Value Date/Time   COLORURINE YELLOW 12/30/2017 2050   APPEARANCEUR CLEAR 12/30/2017 2050   LABSPEC >1.046 (H) 12/30/2017 2050   PHURINE 5.0 12/30/2017 2050   GLUCOSEU NEGATIVE 12/30/2017 2050   [place] NEGATIVE 12/30/2017 2050   [place] NEGATIVE 12/30/2017 2050   [place] 12/30/2017 2050   PROTEINUR NEGATIVE 12/30/2017 2050   NITRITE NEGATIVE 12/30/2017 2050   [place] 12/30/2017 2050   Sepsis Labs: _0 (procalcitonin:4,lacticidven:4)  ) Recent Results (from the past 240 hour(s))  Urine culture     Status: None   Collection Time: 12/30/17  8:50  PM  Result Value Ref Range Status   Specimen Description URINE, CLEAN CATCH  Final   Special Requests NONE  Final   Culture   Final    NO GROWTH Performed at [facility], [place] [address] [place], [place] [address]    Report Status 01/01/2018 FINAL  Final  Surgical pcr screen     Status: Abnormal   Collection Time: 01/05/18  6:15 PM  Result Value Ref Range Status   MRSA, PCR NEGATIVE NEGATIVE Final   Staphylococcus aureus POSITIVE (A) NEGATIVE Final    Comment: (NOTE) The Xpert SA Assay (FDA approved for NASAL specimens in patients 92 years of age and older), is one component of a comprehensive surveillance program. It is not intended to diagnose infection nor to guide or monitor  treatment.       Studies: Ir Veno/ext/bi  Result Date: 01/07/2018 INDICATION: 58 year-old male with metastatic colon cancer an acute severe iliocaval and left lower extremity DVT. He presents for thrombectomy. EXAM: 1. Ultrasound-guided vascular access left popliteal vein 2. Ultrasound-guided access right common femoral vein 3. Ultrasound-guided access right internal jugular vein 4. Second ultrasound-guided access right internal jugular vein 5. Left lower extremity venogram 6. Right lower extremity venogram 7. Inferior vena cavogram 8. Combined pharmacomechanical thrombolysis/thrombectomy using tPA, the AngioVac device, and the Angiojet device 9. IVC filter retrieval 10. Recanalization and balloon angioplasty of the right common iliac vein 11. Recanalization and angioplasty with subsequent stent placement in the left iliac vein 12. Balloon angioplasty of the left common femoral and femoral veins Operating Physicians: Corrie Mckusick, DO and Jacqulynn Cadet, MD COMPARISON:  CT scan 12/30/2017 MEDICATIONS: 12 mg tPA administered directly into thrombus throughout the course of the procedure ANESTHESIA/SEDATION: General anesthesia provided by the anesthesiology service FLUOROSCOPY TIME:  Fluoroscopy Time: 67  minutes 6 seconds (816.6 mGy). COMPLICATIONS: None immediate. TECHNIQUE: Informed written consent was obtained from the patient after a thorough discussion of the procedural risks, benefits and alternatives. All questions were addressed. Maximal Sterile Barrier Technique was utilized including caps, mask, sterile gowns, sterile gloves, sterile drape, hand hygiene and skin antiseptic. A timeout was performed prior to the initiation of the procedure. The patient was initially positioned prone. The left popliteal vein was interrogated with ultrasound and found to be widely patent. An image was obtained and stored for the medical record. Local anesthesia was attained by infiltration with 1% lidocaine. A small dermatotomy was made. Under real-time sonographic guidance, the vessel was punctured with a 21 gauge micropuncture needle. Using standard technique, the initial micro needle was exchanged over a 0.018 micro wire for a transitional 4 [place] micro sheath. The micro sheath was then exchanged over a 0.035 wire for a 45 cm 6 [place] braided Arrow sheath. A left lower extremity venogram was performed. The femoral vein is completely occluded in the thigh. The popliteal vein remains patent. 6 mg tPA was laced throughout the thrombus within the common and femoral veins. The patient was repositioned supine and intubated. The right common femoral was interrogated with ultrasound and found to be patent with changes of chronic DVT. An image was obtained and stored for the medical record. Local anesthesia was attained by infiltration with 1% lidocaine. A small dermatotomy was made. Under real-time sonographic guidance, the vessel was punctured with a 21 gauge micropuncture needle. Using standard technique, the initial micro needle was exchanged over a 0.018 micro wire for a transitional 4 [place] micro sheath. The micro sheath was then exchanged over a 0.035 wire for a 6 French vascular sheath. Right lower extremity venography was  performed. The common femoral and external iliac veins are patent. 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 All venous drainage is via parallel spinous collaterals. An angled catheter and glidewire were introduced through the sheath. With some manipulation, the catheter was successfully advanced into the suprarenal IVC. An inferior vena cavagram was performed confirming patency of both renal veins and the super renal IVC. A superstiff Amplatz wire was advanced into the right heart. A 10 x 40 mm Conquest balloon was introduced over the wire the occluded segment of the common iliac vein was angioplastied. Repeat venography demonstrates recreation of a small vascular channel. A 10 x 40 mm Conquest balloon was then selected and introduced over the wire. Venography demonstrates successful recanalization  of the common iliac vein. There is flow through the IVC. 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 The 6 French sheath was exchanged in the soft tissue tract dilated to 24 [place]. A 26 French Gore dry seal sheath was then advanced over the wire and positioned in the right common iliac vein. The right internal jugular vein was interrogated with ultrasound and found to be widely patent. An image was obtained and stored for the medical record. Local anesthesia was attained by infiltration with 1% lidocaine. A small dermatotomy was made. Under real-time sonographic guidance, the vessel was punctured with a 21 gauge micropuncture needle. Using standard technique, the initial micro needle was exchanged over a 0.018 micro wire for a transitional 4 [place] micro sheath. The micro sheath was then exchanged over a 0.035 wire for a 6 French vascular sheath. A superstiff Amplatz wire was advanced into the suprarenal IVC. The skin tract was then serially dilated and an 39 French reperfusion catheter was advanced and position with the tip at the cavoatrial junction. At this time,  the patient was fully heparinized until the ACT level was greater than 300 and the AngioVac circuit was established. The angio [facility] device was advanced through the 37 French sheath from the right common femoral approach and suction thrombectomy was performed throughout the infrarenal inferior vena cava. Working from the popliteal access, additional left lower extremity venography images were performed focusing on the iliac and common femoral vasculature. Thrombus is present within the common femoral vein and the external iliac vein. The left common iliac vein is completely occluded similar to the other side. Attempts were made to pass through the occluded left common iliac vein from below. This was unsuccessful. Therefore, a second right internal jugular venous access was required. The right internal jugular vein was was again interrogated with ultrasound and found to be widely patent. An image was obtained and stored for the medical record. Local anesthesia was attained by infiltration with 1% lidocaine. A small dermatotomy was made. Under real-time sonographic guidance, the vessel was punctured with a 21 gauge micropuncture needle. Using standard technique, the initial micro needle was exchanged over a 0.018 micro wire for a transitional 4 [place] micro sheath. The micro sheath was then exchanged over a 0.035 wire for a 6 French vascular sheath. An angled catheter and glidewire were advanced through the sheath, through the IVC filter, through the infrarenal IVC and the catheter successfully navigated across the chronically occluded left common iliac vein. The glidewire was then exchanged for an exchange length Glidewire and snared from the femoral vein in the midthigh and brought through the popliteal sheath access resulting in successful through and through access from the right internal jugular vein through the left popliteal vein. A catheter was then advanced over the wire into the femoral vein and the glidewire  exchanged for a superstiff Amplatz wire. At this time, another cavagram was performed demonstrating successful debulking of the caval thrombus. At this point it was decided to proceed with removal of the IVC filter. Over the superstiff Amplatz wire from the right jugular vein approach, the skin tract was dilated and a second 26 [place] Gore dry seal sheath was advanced and positioned in the suprarenal IVC. A Bard IVC filter retrieval set was then advanced coaxially through this device next to the Amplatz wire. The Bard [place] IVC filter was successfully retrieved using standard technique. Additional thrombectomy was then performed using the angio VAC device. This yielded  significant material. Repeat vena cavagram demonstrates complete removal of thrombus from the inferior vena cava. Attention was next turned to the occluded left common iliac artery. The perfusion circuit was reversed and the reperfusion catheter was placed in the right femoral sheath and the angio back device was advanced through the right IJ sheath. Suction thrombectomy was performed in the right common iliac vein. Working from the popliteal access, angioplasty was performed of the common femoral, external iliac and common iliac veins and thrombus was pushed into the AngioVac. There appears to be a significant stenosis at the transition from the common iliac to the external iliac vein. This was resistant to initial angioplasty, so a 14 x 60 mm wall stent was selected, placed across the lesion and deployed. The wall stent was then post dilated to 12 mm using the 12 by 40 Conquest balloon. Catheter venography demonstrates significant near occlusive thrombus throughout the common femoral vein, and femoral vein in the thigh. Therefore, the decision was made to proceed with additional thrombectomy from the popliteal access using the [facility]. The 6 French left popliteal sheath was exchanged over the wire for a 9 French sheath.  The is a long take catheter was advanced over the wire and real lytic thrombectomy performed throughout the femoral, common femoral and iliac veins. Follow-up angiography demonstrates significant improvement with restoration of in-line flow. There is a significant residual stenosis at the common femoral vein. This was angioplastied to 12 mm using the 12 x 40 mm Conquest balloon. On the final angiographic runs, there is successful restoration of in-line flow from the popliteal access through the IVC as well as successful recanalization of both iliac veins. All wires and devices were removed. Once the patient's ACT drop below 200, the sheaths were removed. Hemostasis was attained with a combination of manual pressure and pursestring sutures at both 26 [place] sheath sites. 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 Significant stenosis at the transition from the left common to the left external iliac vein 3. 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 Successful recanalization and angioplasty of the chronically occluded right common iliac vein 2. Successful thrombolysis/thrombectomy of the inferior vena cava, left-sided iliac veins and left lower extremity deep venous system to the level of the popliteal vein. 3. Stent assisted angioplasty of a flow limiting stenosis at the interface of the left common and external iliac veins. 4. Angioplasty of a stenotic segment of the left common femoral vein. 5. The IVC filter was retrieved.  No additional filter was placed. PLAN: 1. Continue full-dose heparin and transitioned to weight based Lovenox prior to discharge. 2. Progress toward ambulation as soon as clinically possible. 3. Compression garment or Ace wraps to the left lower extremity if needed to facilitate edema reduction. 4. Follow-up with Dr. Laurence Ferrari in interventional radiology clinic in 2 weeks. 5. If patient demonstrates any evidence of bleeding and needs to come off of his  anticoagulation in the future, he must have an IVC filter replaced. Signed, Criselda Peaches, MD [facility] Electronically Signed   By: Jacqulynn Cadet M.D.   On: 01/07/2018 10:46   Ir [place] Arn Ivc  Result Date: 01/07/2018 INDICATION: 57 year-old male with metastatic colon cancer an acute severe iliocaval and left lower extremity DVT. He presents for thrombectomy. EXAM: 1. Ultrasound-guided vascular access left popliteal vein 2. Ultrasound-guided access right common femoral vein 3. Ultrasound-guided access right internal jugular vein 4.  Second ultrasound-guided access right internal jugular vein 5. Left lower extremity venogram 6. Right lower extremity venogram 7. Inferior vena cavogram 8. Combined pharmacomechanical thrombolysis/thrombectomy using tPA, the AngioVac device, and the Angiojet device 9. IVC filter retrieval 10. Recanalization and balloon angioplasty of the right common iliac vein 11. Recanalization and angioplasty with subsequent stent placement in the left iliac vein 12. Balloon angioplasty of the left common femoral and femoral veins Operating Physicians: Corrie Mckusick, DO and Jacqulynn Cadet, MD COMPARISON:  CT scan 12/30/2017 MEDICATIONS: 12 mg tPA administered directly into thrombus throughout the course of the procedure ANESTHESIA/SEDATION: General anesthesia provided by the anesthesiology service FLUOROSCOPY TIME:  Fluoroscopy Time: 67 minutes 6 seconds (816.6 mGy). COMPLICATIONS: None immediate. TECHNIQUE: Informed written consent was obtained from the patient after a thorough discussion of the procedural risks, benefits and alternatives. All questions were addressed. Maximal Sterile Barrier Technique was utilized including caps, mask, sterile gowns, sterile gloves, sterile drape, hand hygiene and skin antiseptic. A timeout was performed prior to the initiation of the procedure. The patient was initially positioned prone.  The left popliteal vein was interrogated with ultrasound and found to be widely patent. An image was obtained and stored for the medical record. Local anesthesia was attained by infiltration with 1% lidocaine. A small dermatotomy was made. Under real-time sonographic guidance, the vessel was punctured with a 21 gauge micropuncture needle. Using standard technique, the initial micro needle was exchanged over a 0.018 micro wire for a transitional 4 [place] micro sheath. The micro sheath was then exchanged over a 0.035 wire for a 45 cm 6 [place] braided Arrow sheath. A left lower extremity venogram was performed. The femoral vein is completely occluded in the thigh. The popliteal vein remains patent. 6 mg tPA was laced throughout the thrombus within the common and femoral veins. The patient was repositioned supine and intubated. The right common femoral was interrogated with ultrasound and found to be patent with changes of chronic DVT. An image was obtained and stored for the medical record. Local anesthesia was attained by infiltration with 1% lidocaine. A small dermatotomy was made. Under real-time sonographic guidance, the vessel was punctured with a 21 gauge micropuncture needle. Using standard technique, the initial micro needle was exchanged over a 0.018 micro wire for a transitional 4 [place] micro sheath. The micro sheath was then exchanged over a 0.035 wire for a 6 French vascular sheath. Right lower extremity venography was performed. The common femoral and external iliac veins are patent. 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 All venous drainage is via parallel spinous collaterals. An angled catheter and glidewire were introduced through the sheath. With some manipulation, the catheter was successfully advanced into the suprarenal IVC. An inferior vena cavagram was performed confirming patency of both renal veins and the super renal IVC. A superstiff Amplatz wire was advanced into the right heart. A  10 x 40 mm Conquest balloon was introduced over the wire the occluded segment of the common iliac vein was angioplastied. Repeat venography demonstrates recreation of a small vascular channel. A 10 x 40 mm Conquest balloon was then selected and introduced over the wire. Venography demonstrates successful recanalization of the common iliac vein. There is flow through the IVC. 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 The 6 French sheath was exchanged in the soft tissue tract dilated to 24 [place]. A 26 French Gore dry seal sheath was then advanced over the wire and positioned in the right common  iliac vein. The right internal jugular vein was interrogated with ultrasound and found to be widely patent. An image was obtained and stored for the medical record. Local anesthesia was attained by infiltration with 1% lidocaine. A small dermatotomy was made. Under real-time sonographic guidance, the vessel was punctured with a 21 gauge micropuncture needle. Using standard technique, the initial micro needle was exchanged over a 0.018 micro wire for a transitional 4 [place] micro sheath. The micro sheath was then exchanged over a 0.035 wire for a 6 French vascular sheath. A superstiff Amplatz wire was advanced into the suprarenal IVC. The skin tract was then serially dilated and an 32 French reperfusion catheter was advanced and position with the tip at the cavoatrial junction. At this time, the patient was fully heparinized until the ACT level was greater than 300 and the AngioVac circuit was established. The angio [facility] device was advanced through the 34 French sheath from the right common femoral approach and suction thrombectomy was performed throughout the infrarenal inferior vena cava. Working from the popliteal access, additional left lower extremity venography images were performed focusing on the iliac and common femoral vasculature. Thrombus is present within the  common femoral vein and the external iliac vein. The left common iliac vein is completely occluded similar to the other side. Attempts were made to pass through the occluded left common iliac vein from below. This was unsuccessful. Therefore, a second right internal jugular venous access was required. The right internal jugular vein was was again interrogated with ultrasound and found to be widely patent. An image was obtained and stored for the medical record. Local anesthesia was attained by infiltration with 1% lidocaine. A small dermatotomy was made. Under real-time sonographic guidance, the vessel was punctured with a 21 gauge micropuncture needle. Using standard technique, the initial micro needle was exchanged over a 0.018 micro wire for a transitional 4 [place] micro sheath. The micro sheath was then exchanged over a 0.035 wire for a 6 French vascular sheath. An angled catheter and glidewire were advanced through the sheath, through the IVC filter, through the infrarenal IVC and the catheter successfully navigated across the chronically occluded left common iliac vein. The glidewire was then exchanged for an exchange length Glidewire and snared from the femoral vein in the midthigh and brought through the popliteal sheath access resulting in successful through and through access from the right internal jugular vein through the left popliteal vein. A catheter was then advanced over the wire into the femoral vein and the glidewire exchanged for a superstiff Amplatz wire. At this time, another cavagram was performed demonstrating successful debulking of the caval thrombus. At this point it was decided to proceed with removal of the IVC filter. Over the superstiff Amplatz wire from the right jugular vein approach, the skin tract was dilated and a second 26 [place] Gore dry seal sheath was advanced and positioned in the suprarenal IVC. A Bard IVC filter retrieval set was then advanced coaxially through this device  next to the Amplatz wire. The Bard [place] IVC filter was successfully retrieved using standard technique. Additional thrombectomy was then performed using the angio VAC device. This yielded significant material. Repeat vena cavagram demonstrates complete removal of thrombus from the inferior vena cava. Attention was next turned to the occluded left common iliac artery. The perfusion circuit was reversed and the reperfusion catheter was placed in the right femoral sheath and the angio back device was advanced through the right IJ sheath. Suction thrombectomy was performed  in the right common iliac vein. Working from the popliteal access, angioplasty was performed of the common femoral, external iliac and common iliac veins and thrombus was pushed into the AngioVac. There appears to be a significant stenosis at the transition from the common iliac to the external iliac vein. This was resistant to initial angioplasty, so a 14 x 60 mm wall stent was selected, placed across the lesion and deployed. The wall stent was then post dilated to 12 mm using the 12 by 40 Conquest balloon. Catheter venography demonstrates significant near occlusive thrombus throughout the common femoral vein, and femoral vein in the thigh. Therefore, the decision was made to proceed with additional thrombectomy from the popliteal access using the [facility]. The 6 French left popliteal sheath was exchanged over the wire for a 9 French sheath. The is a long take catheter was advanced over the wire and real lytic thrombectomy performed throughout the femoral, common femoral and iliac veins. Follow-up angiography demonstrates significant improvement with restoration of in-line flow. There is a significant residual stenosis at the common femoral vein. This was angioplastied to 12 mm using the 12 x 40 mm Conquest balloon. On the final angiographic runs, there is successful restoration of in-line flow from the popliteal access  through the IVC as well as successful recanalization of both iliac veins. All wires and devices were removed. Once the patient's ACT drop below 200, the sheaths were removed. Hemostasis was attained with a combination of manual pressure and pursestring sutures at both 26 [place] sheath sites. 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 Significant stenosis at the transition from the left common to the left external iliac vein 3. 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 Successful recanalization and angioplasty of the chronically occluded right common iliac vein 2. Successful thrombolysis/thrombectomy of the inferior vena cava, left-sided iliac veins and left lower extremity deep venous system to the level of the popliteal vein. 3. Stent assisted angioplasty of a flow limiting stenosis at the interface of the left common and external iliac veins. 4. Angioplasty of a stenotic segment of the left common femoral vein. 5. The IVC filter was retrieved.  No additional filter was placed. PLAN: 1. Continue full-dose heparin and transitioned to weight based Lovenox prior to discharge. 2. Progress toward ambulation as soon as clinically possible. 3. Compression garment or Ace wraps to the left lower extremity if needed to facilitate edema reduction. 4. Follow-up with Dr. Laurence Ferrari in interventional radiology clinic in 2 weeks. 5. If patient demonstrates any evidence of bleeding and needs to come off of his anticoagulation in the future, he must have an IVC filter replaced. Signed, Criselda Peaches, MD [facility] Radiology Electronically Signed   By: Jacqulynn Cadet M.D.   On: 01/07/2018 10:46   Ir Thrombect Veno Mech Mod Sed  Result Date: 01/07/2018 INDICATION: 57 year-old male with metastatic colon cancer an acute severe iliocaval and left lower extremity DVT. He presents for thrombectomy. EXAM: 1. Ultrasound-guided vascular  access left popliteal vein 2. Ultrasound-guided access right common femoral vein 3. Ultrasound-guided access right internal jugular vein 4. Second ultrasound-guided access right internal jugular vein 5. Left lower extremity venogram 6. Right lower extremity venogram 7. Inferior vena cavogram 8. Combined pharmacomechanical thrombolysis/thrombectomy using tPA, the AngioVac device, and the Angiojet device 9. IVC filter retrieval 10. Recanalization and balloon angioplasty of the right common iliac vein 11. Recanalization and angioplasty with subsequent  stent placement in the left iliac vein 12. Balloon angioplasty of the left common femoral and femoral veins Operating Physicians: Corrie Mckusick, DO and Jacqulynn Cadet, MD COMPARISON:  CT scan 12/30/2017 MEDICATIONS: 12 mg tPA administered directly into thrombus throughout the course of the procedure ANESTHESIA/SEDATION: General anesthesia provided by the anesthesiology service FLUOROSCOPY TIME:  Fluoroscopy Time: 67 minutes 6 seconds (816.6 mGy). COMPLICATIONS: None immediate. TECHNIQUE: Informed written consent was obtained from the patient after a thorough discussion of the procedural risks, benefits and alternatives. All questions were addressed. Maximal Sterile Barrier Technique was utilized including caps, mask, sterile gowns, sterile gloves, sterile drape, hand hygiene and skin antiseptic. A timeout was performed prior to the initiation of the procedure. The patient was initially positioned prone. The left popliteal vein was interrogated with ultrasound and found to be widely patent. An image was obtained and stored for the medical record. Local anesthesia was attained by infiltration with 1% lidocaine. A small dermatotomy was made. Under real-time sonographic guidance, the vessel was punctured with a 21 gauge micropuncture needle. Using standard technique, the initial micro needle was exchanged over a 0.018 micro wire for a transitional 4 [place] micro sheath. The  micro sheath was then exchanged over a 0.035 wire for a 45 cm 6 [place] braided Arrow sheath. A left lower extremity venogram was performed. The femoral vein is completely occluded in the thigh. The popliteal vein remains patent. 6 mg tPA was laced throughout the thrombus within the common and femoral veins. The patient was repositioned supine and intubated. The right common femoral was interrogated with ultrasound and found to be patent with changes of chronic DVT. An image was obtained and stored for the medical record. Local anesthesia was attained by infiltration with 1% lidocaine. A small dermatotomy was made. Under real-time sonographic guidance, the vessel was punctured with a 21 gauge micropuncture needle. Using standard technique, the initial micro needle was exchanged over a 0.018 micro wire for a transitional 4 [place] micro sheath. The micro sheath was then exchanged over a 0.035 wire for a 6 French vascular sheath. Right lower extremity venography was performed. The common femoral and external iliac veins are patent. 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 All venous drainage is via parallel spinous collaterals. An angled catheter and glidewire were introduced through the sheath. With some manipulation, the catheter was successfully advanced into the suprarenal IVC. An inferior vena cavagram was performed confirming patency of both renal veins and the super renal IVC. A superstiff Amplatz wire was advanced into the right heart. A 10 x 40 mm Conquest balloon was introduced over the wire the occluded segment of the common iliac vein was angioplastied. Repeat venography demonstrates recreation of a small vascular channel. A 10 x 40 mm Conquest balloon was then selected and introduced over the wire. Venography demonstrates successful recanalization of the common iliac vein. There is flow through the IVC. 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 The 6  French sheath was exchanged in the soft tissue tract dilated to 24 [place]. A 26 French Gore dry seal sheath was then advanced over the wire and positioned in the right common iliac vein. The right internal jugular vein was interrogated with ultrasound and found to be widely patent. An image was obtained and stored for the medical record. Local anesthesia was attained by infiltration with 1% lidocaine. A small dermatotomy was made. Under real-time sonographic guidance, the vessel was punctured with a 21 gauge micropuncture needle.  Using standard technique, the initial micro needle was exchanged over a 0.018 micro wire for a transitional 4 [place] micro sheath. The micro sheath was then exchanged over a 0.035 wire for a 6 French vascular sheath. A superstiff Amplatz wire was advanced into the suprarenal IVC. The skin tract was then serially dilated and an 46 French reperfusion catheter was advanced and position with the tip at the cavoatrial junction. At this time, the patient was fully heparinized until the ACT level was greater than 300 and the AngioVac circuit was established. The angio [facility] device was advanced through the 36 French sheath from the right common femoral approach and suction thrombectomy was performed throughout the infrarenal inferior vena cava. Working from the popliteal access, additional left lower extremity venography images were performed focusing on the iliac and common femoral vasculature. Thrombus is present within the common femoral vein and the external iliac vein. The left common iliac vein is completely occluded similar to the other side. Attempts were made to pass through the occluded left common iliac vein from below. This was unsuccessful. Therefore, a second right internal jugular venous access was required. The right internal jugular vein was was again interrogated with ultrasound and found to be widely patent. An image was obtained and stored for the medical record. Local anesthesia  was attained by infiltration with 1% lidocaine. A small dermatotomy was made. Under real-time sonographic guidance, the vessel was punctured with a 21 gauge micropuncture needle. Using standard technique, the initial micro needle was exchanged over a 0.018 micro wire for a transitional 4 [place] micro sheath. The micro sheath was then exchanged over a 0.035 wire for a 6 French vascular sheath. An angled catheter and glidewire were advanced through the sheath, through the IVC filter, through the infrarenal IVC and the catheter successfully navigated across the chronically occluded left common iliac vein. The glidewire was then exchanged for an exchange length Glidewire and snared from the femoral vein in the midthigh and brought through the popliteal sheath access resulting in successful through and through access from the right internal jugular vein through the left popliteal vein. A catheter was then advanced over the wire into the femoral vein and the glidewire exchanged for a superstiff Amplatz wire. At this time, another cavagram was performed demonstrating successful debulking of the caval thrombus. At this point it was decided to proceed with removal of the IVC filter. Over the superstiff Amplatz wire from the right jugular vein approach, the skin tract was dilated and a second 26 [place] Gore dry seal sheath was advanced and positioned in the suprarenal IVC. A Bard IVC filter retrieval set was then advanced coaxially through this device next to the Amplatz wire. The Bard [employer] IVC filter was successfully retrieved using standard technique. Additional thrombectomy was then performed using the angio VAC device. This yielded significant material. Repeat vena cavagram demonstrates complete removal of thrombus from the inferior vena cava. Attention was next turned to the occluded left common iliac artery. The perfusion circuit was reversed and the reperfusion catheter was placed in the right femoral sheath and the  angio back device was advanced through the right IJ sheath. Suction thrombectomy was performed in the right common iliac vein. Working from the popliteal access, angioplasty was performed of the common femoral, external iliac and common iliac veins and thrombus was pushed into the AngioVac. There appears to be a significant stenosis at the transition from the common iliac to the external iliac vein. This was resistant to initial  angioplasty, so a 14 x 60 mm wall stent was selected, placed across the lesion and deployed. The wall stent was then post dilated to 12 mm using the 12 by 40 Conquest balloon. Catheter venography demonstrates significant near occlusive thrombus throughout the common femoral vein, and femoral vein in the thigh. Therefore, the decision was made to proceed with additional thrombectomy from the popliteal access using the [facility]. The 6 French left popliteal sheath was exchanged over the wire for a 9 French sheath. The is a long take catheter was advanced over the wire and real lytic thrombectomy performed throughout the femoral, common femoral and iliac veins. Follow-up angiography demonstrates significant improvement with restoration of in-line flow. There is a significant residual stenosis at the common femoral vein. This was angioplastied to 12 mm using the 12 x 40 mm Conquest balloon. On the final angiographic runs, there is successful restoration of in-line flow from the popliteal access through the IVC as well as successful recanalization of both iliac veins. All wires and devices were removed. Once the patient's ACT drop below 200, the sheaths were removed. Hemostasis was attained with a combination of manual pressure and pursestring sutures at both 26 [place] sheath sites. 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 Significant stenosis at the transition from the left common to the left external iliac vein 3. Severe ileo caval and left lower  extremity DVT IMPRESSION: 1. Successful recanalization and angioplasty of the chronically occluded right common iliac vein 2. Successful thrombolysis/thrombectomy of the inferior vena cava, left-sided iliac veins and left lower extremity deep venous system to the level of the popliteal vein. 3. Stent assisted angioplasty of a flow limiting stenosis at the interface of the left common and external iliac veins. 4. Angioplasty of a stenotic segment of the left common femoral vein. 5. The IVC filter was retrieved.  No additional filter was placed. PLAN: 1. Continue full-dose heparin and transitioned to weight based Lovenox prior to discharge. 2. Progress toward ambulation as soon as clinically possible. 3. Compression garment or Ace wraps to the left lower extremity if needed to facilitate edema reduction. 4. Follow-up with Dr. Laurence Ferrari in interventional radiology clinic in 2 weeks. 5. If patient demonstrates any evidence of bleeding and needs to come off of his anticoagulation in the future, he must have an IVC filter replaced. Signed, Criselda Peaches, MD [facility]ascular and Interventional Radiology Specialists [facility] Radiology Electronically Signed   By: Jacqulynn Cadet M.D.   On: 01/07/2018 10:46   Ir US Guide Vasc Access Left  Result Date: 01/07/2018 INDICATION: 57 year-old male with metastatic colon cancer an acute severe iliocaval and left lower extremity DVT. He presents for thrombectomy. EXAM: 1. Ultrasound-guided vascular access left popliteal vein 2. Ultrasound-guided access right common femoral vein 3. Ultrasound-guided access right internal jugular vein 4. Second ultrasound-guided access right internal jugular vein 5. Left lower extremity venogram 6. Right lower extremity venogram 7. Inferior vena cavogram 8. Combined pharmacomechanical thrombolysis/thrombectomy using tPA, the AngioVac device, and the Angiojet device 9. IVC filter retrieval 10. Recanalization and balloon angioplasty of the right  common iliac vein 11. Recanalization and angioplasty with subsequent stent placement in the left iliac vein 12. Balloon angioplasty of the left common femoral and femoral veins Operating Physicians: Corrie Mckusick, DO and Jacqulynn Cadet, MD COMPARISON:  CT scan 12/30/2017 MEDICATIONS: 12 mg tPA administered directly into thrombus throughout the course of the procedure ANESTHESIA/SEDATION: General anesthesia provided by the anesthesiology service FLUOROSCOPY  TIME:  Fluoroscopy Time: 67 minutes 6 seconds (816.6 mGy). COMPLICATIONS: None immediate. TECHNIQUE: Informed written consent was obtained from the patient after a thorough discussion of the procedural risks, benefits and alternatives. All questions were addressed. Maximal Sterile Barrier Technique was utilized including caps, mask, sterile gowns, sterile gloves, sterile drape, hand hygiene and skin antiseptic. A timeout was performed prior to the initiation of the procedure. The patient was initially positioned prone. The left popliteal vein was interrogated with ultrasound and found to be widely patent. An image was obtained and stored for the medical record. Local anesthesia was attained by infiltration with 1% lidocaine. A small dermatotomy was made. Under real-time sonographic guidance, the vessel was punctured with a 21 gauge micropuncture needle. Using standard technique, the initial micro needle was exchanged over a 0.018 micro wire for a transitional 4 [place] micro sheath. The micro sheath was then exchanged over a 0.035 wire for a 45 cm 6 [place] braided Arrow sheath. A left lower extremity venogram was performed. The femoral vein is completely occluded in the thigh. The popliteal vein remains patent. 6 mg tPA was laced throughout the thrombus within the common and femoral veins. The patient was repositioned supine and intubated. The right common femoral was interrogated with ultrasound and found to be patent with changes of chronic DVT. An image was  obtained and stored for the medical record. Local anesthesia was attained by infiltration with 1% lidocaine. A small dermatotomy was made. Under real-time sonographic guidance, the vessel was punctured with a 21 gauge micropuncture needle. Using standard technique, the initial micro needle was exchanged over a 0.018 micro wire for a transitional 4 [place] micro sheath. The micro sheath was then exchanged over a 0.035 wire for a 6 French vascular sheath. Right lower extremity venography was performed. The common femoral and external iliac veins are patent. 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 All venous drainage is via parallel spinous collaterals. An angled catheter and glidewire were introduced through the sheath. With some manipulation, the catheter was successfully advanced into the suprarenal IVC. An inferior vena cavagram was performed confirming patency of both renal veins and the super renal IVC. A superstiff Amplatz wire was advanced into the right heart. A 10 x 40 mm Conquest balloon was introduced over the wire the occluded segment of the common iliac vein was angioplastied. Repeat venography demonstrates recreation of a small vascular channel. A 10 x 40 mm Conquest balloon was then selected and introduced over the wire. Venography demonstrates successful recanalization of the common iliac vein. There is flow through the IVC. 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 The 6 French sheath was exchanged in the soft tissue tract dilated to 24 [place]. A 26 French Gore dry seal sheath was then advanced over the wire and positioned in the right common iliac vein. The right internal jugular vein was interrogated with ultrasound and found to be widely patent. An image was obtained and stored for the medical record. Local anesthesia was attained by infiltration with 1% lidocaine. A small dermatotomy was made. Under real-time sonographic guidance, the  vessel was punctured with a 21 gauge micropuncture needle. Using standard technique, the initial micro needle was exchanged over a 0.018 micro wire for a transitional 4 [place] micro sheath. The micro sheath was then exchanged over a 0.035 wire for a 6 French vascular sheath. A superstiff Amplatz wire was advanced into the suprarenal IVC. The skin tract was then serially dilated and  an 76 French reperfusion catheter was advanced and position with the tip at the cavoatrial junction. At this time, the patient was fully heparinized until the ACT level was greater than 300 and the AngioVac circuit was established. The angio [facility] device was advanced through the 55 French sheath from the right common femoral approach and suction thrombectomy was performed throughout the infrarenal inferior vena cava. Working from the popliteal access, additional left lower extremity venography images were performed focusing on the iliac and common femoral vasculature. Thrombus is present within the common femoral vein and the external iliac vein. The left common iliac vein is completely occluded similar to the other side. Attempts were made to pass through the occluded left common iliac vein from below. This was unsuccessful. Therefore, a second right internal jugular venous access was required. The right internal jugular vein was was again interrogated with ultrasound and found to be widely patent. An image was obtained and stored for the medical record. Local anesthesia was attained by infiltration with 1% lidocaine. A small dermatotomy was made. Under real-time sonographic guidance, the vessel was punctured with a 21 gauge micropuncture needle. Using standard technique, the initial micro needle was exchanged over a 0.018 micro wire for a transitional 4 [place] micro sheath. The micro sheath was then exchanged over a 0.035 wire for a 6 French vascular sheath. An angled catheter and glidewire were advanced through the sheath, through the  IVC filter, through the infrarenal IVC and the catheter successfully navigated across the chronically occluded left common iliac vein. The glidewire was then exchanged for an exchange length Glidewire and snared from the femoral vein in the midthigh and brought through the popliteal sheath access resulting in successful through and through access from the right internal jugular vein through the left popliteal vein. A catheter was then advanced over the wire into the femoral vein and the glidewire exchanged for a superstiff Amplatz wire. At this time, another cavagram was performed demonstrating successful debulking of the caval thrombus. At this point it was decided to proceed with removal of the IVC filter. Over the superstiff Amplatz wire from the right jugular vein approach, the skin tract was dilated and a second 26 [place] Gore dry seal sheath was advanced and positioned in the suprarenal IVC. A Bard IVC filter retrieval set was then advanced coaxially through this device next to the Amplatz wire. The Bard [place] IVC filter was successfully retrieved using standard technique. Additional thrombectomy was then performed using the angio VAC device. This yielded significant material. Repeat vena cavagram demonstrates complete removal of thrombus from the inferior vena cava. Attention was next turned to the occluded left common iliac artery. The perfusion circuit was reversed and the reperfusion catheter was placed in the right femoral sheath and the angio back device was advanced through the right IJ sheath. Suction thrombectomy was performed in the right common iliac vein. Working from the popliteal access, angioplasty was performed of the common femoral, external iliac and common iliac veins and thrombus was pushed into the AngioVac. There appears to be a significant stenosis at the transition from the common iliac to the external iliac vein. This was resistant to initial angioplasty, so a 14 x 60 mm wall stent  was selected, placed across the lesion and deployed. The wall stent was then post dilated to 12 mm using the 12 by 40 Conquest balloon. Catheter venography demonstrates significant near occlusive thrombus throughout the common femoral vein, and femoral vein in the thigh. Therefore, the decision  was made to proceed with additional thrombectomy from the popliteal access using the [facility]. The 6 French left popliteal sheath was exchanged over the wire for a 9 French sheath. The is a long take catheter was advanced over the wire and real lytic thrombectomy performed throughout the femoral, common femoral and iliac veins. Follow-up angiography demonstrates significant improvement with restoration of in-line flow. There is a significant residual stenosis at the common femoral vein. This was angioplastied to 12 mm using the 12 x 40 mm Conquest balloon. On the final angiographic runs, there is successful restoration of in-line flow from the popliteal access through the IVC as well as successful recanalization of both iliac veins. All wires and devices were removed. Once the patient's ACT drop below 200, the sheaths were removed. Hemostasis was attained with a combination of manual pressure and pursestring sutures at both 26 [place] sheath sites. 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 Significant stenosis at the transition from the left common to the left external iliac vein 3. 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 Successful recanalization and angioplasty of the chronically occluded right common iliac vein 2. Successful thrombolysis/thrombectomy of the inferior vena cava, left-sided iliac veins and left lower extremity deep venous system to the level of the popliteal vein. 3. Stent assisted angioplasty of a flow limiting stenosis at the interface of the left common and external iliac veins. 4. Angioplasty of a stenotic segment of the left common  femoral vein. 5. The IVC filter was retrieved.  No additional filter was placed. PLAN: 1. Continue full-dose heparin and transitioned to weight based Lovenox prior to discharge. 2. Progress toward ambulation as soon as clinically possible. 3. Compression garment or Ace wraps to the left lower extremity if needed to facilitate edema reduction. 4. Follow-up with Dr. Laurence Ferrari in interventional radiology clinic in 2 weeks. 5. If patient demonstrates any evidence of bleeding and needs to come off of his anticoagulation in the future, he must have an IVC filter replaced. Signed, Criselda Peaches, MD [facility] Radiology Electronically Signed   By: Jacqulynn Cadet M.D.   On: 01/07/2018 10:46   Ir US Guide Vasc Access Right  Result Date: 01/07/2018 INDICATION: 57 year-old male with metastatic colon cancer an acute severe iliocaval and left lower extremity DVT. He presents for thrombectomy. EXAM: 1. Ultrasound-guided vascular access left popliteal vein 2. Ultrasound-guided access right common femoral vein 3. Ultrasound-guided access right internal jugular vein 4. Second ultrasound-guided access right internal jugular vein 5. Left lower extremity venogram 6. Right lower extremity venogram 7. Inferior vena cavogram 8. Combined pharmacomechanical thrombolysis/thrombectomy using tPA, the AngioVac device, and the Angiojet device 9. IVC filter retrieval 10. Recanalization and balloon angioplasty of the right common iliac vein 11. Recanalization and angioplasty with subsequent stent placement in the left iliac vein 12. Balloon angioplasty of the left common femoral and femoral veins Operating Physicians: Corrie Mckusick, DO and Jacqulynn Cadet, MD COMPARISON:  CT scan 12/30/2017 MEDICATIONS: 12 mg tPA administered directly into thrombus throughout the course of the procedure ANESTHESIA/SEDATION: General anesthesia provided by the anesthesiology service FLUOROSCOPY TIME:   Fluoroscopy Time: 67 minutes 6 seconds (816.6 mGy). COMPLICATIONS: None immediate. TECHNIQUE: Informed written consent was obtained from the patient after a thorough discussion of the procedural risks, benefits and alternatives. All questions were addressed. Maximal Sterile Barrier Technique was utilized including caps, mask, sterile gowns, sterile gloves, sterile drape, hand hygiene and  skin antiseptic. A timeout was performed prior to the initiation of the procedure. The patient was initially positioned prone. The left popliteal vein was interrogated with ultrasound and found to be widely patent. An image was obtained and stored for the medical record. Local anesthesia was attained by infiltration with 1% lidocaine. A small dermatotomy was made. Under real-time sonographic guidance, the vessel was punctured with a 21 gauge micropuncture needle. Using standard technique, the initial micro needle was exchanged over a 0.018 micro wire for a transitional 4 [place] micro sheath. The micro sheath was then exchanged over a 0.035 wire for a 45 cm 6 [place] braided Arrow sheath. A left lower extremity venogram was performed. The femoral vein is completely occluded in the thigh. The popliteal vein remains patent. 6 mg tPA was laced throughout the thrombus within the common and femoral veins. The patient was repositioned supine and intubated. The right common femoral was interrogated with ultrasound and found to be patent with changes of chronic DVT. An image was obtained and stored for the medical record. Local anesthesia was attained by infiltration with 1% lidocaine. A small dermatotomy was made. Under real-time sonographic guidance, the vessel was punctured with a 21 gauge micropuncture needle. Using standard technique, the initial micro needle was exchanged over a 0.018 micro wire for a transitional 4 [place] micro sheath. The micro sheath was then exchanged over a 0.035 wire for a 6 French vascular sheath. Right lower  extremity venography was performed. The common femoral and external iliac veins are patent. 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 All venous drainage is via parallel spinous collaterals. An angled catheter and glidewire were introduced through the sheath. With some manipulation, the catheter was successfully advanced into the suprarenal IVC. An inferior vena cavagram was performed confirming patency of both renal veins and the super renal IVC. A superstiff Amplatz wire was advanced into the right heart. A 10 x 40 mm Conquest balloon was introduced over the wire the occluded segment of the common iliac vein was angioplastied. Repeat venography demonstrates recreation of a small vascular channel. A 10 x 40 mm Conquest balloon was then selected and introduced over the wire. Venography demonstrates successful recanalization of the common iliac vein. There is flow through the IVC. 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 The 6 French sheath was exchanged in the soft tissue tract dilated to 24 [place]. A 26 French Gore dry seal sheath was then advanced over the wire and positioned in the right common iliac vein. The right internal jugular vein was interrogated with ultrasound and found to be widely patent. An image was obtained and stored for the medical record. Local anesthesia was attained by infiltration with 1% lidocaine. A small dermatotomy was made. Under real-time sonographic guidance, the vessel was punctured with a 21 gauge micropuncture needle. Using standard technique, the initial micro needle was exchanged over a 0.018 micro wire for a transitional 4 [place] micro sheath. The micro sheath was then exchanged over a 0.035 wire for a 6 French vascular sheath. A superstiff Amplatz wire was advanced into the suprarenal IVC. The skin tract was then serially dilated and an 39 French reperfusion catheter was advanced and position with the tip at the  cavoatrial junction. At this time, the patient was fully heparinized until the ACT level was greater than 300 and the AngioVac circuit was established. The angio [facility] device was advanced through the 26 French sheath from the right common femoral approach  and suction thrombectomy was performed throughout the infrarenal inferior vena cava. Working from the popliteal access, additional left lower extremity venography images were performed focusing on the iliac and common femoral vasculature. Thrombus is present within the common femoral vein and the external iliac vein. The left common iliac vein is completely occluded similar to the other side. Attempts were made to pass through the occluded left common iliac vein from below. This was unsuccessful. Therefore, a second right internal jugular venous access was required. The right internal jugular vein was was again interrogated with ultrasound and found to be widely patent. An image was obtained and stored for the medical record. Local anesthesia was attained by infiltration with 1% lidocaine. A small dermatotomy was made. Under real-time sonographic guidance, the vessel was punctured with a 21 gauge micropuncture needle. Using standard technique, the initial micro needle was exchanged over a 0.018 micro wire for a transitional 4 [place] micro sheath. The micro sheath was then exchanged over a 0.035 wire for a 6 French vascular sheath. An angled catheter and glidewire were advanced through the sheath, through the IVC filter, through the infrarenal IVC and the catheter successfully navigated across the chronically occluded left common iliac vein. The glidewire was then exchanged for an exchange length Glidewire and snared from the femoral vein in the midthigh and brought through the popliteal sheath access resulting in successful through and through access from the right internal jugular vein through the left popliteal vein. A catheter was then advanced over the wire into  the femoral vein and the glidewire exchanged for a superstiff Amplatz wire. At this time, another cavagram was performed demonstrating successful debulking of the caval thrombus. At this point it was decided to proceed with removal of the IVC filter. Over the superstiff Amplatz wire from the right jugular vein approach, the skin tract was dilated and a second 26 [place] Gore dry seal sheath was advanced and positioned in the suprarenal IVC. A Bard IVC filter retrieval set was then advanced coaxially through this device next to the Amplatz wire. The Bard [place] IVC filter was successfully retrieved using standard technique. Additional thrombectomy was then performed using the angio VAC device. This yielded significant material. Repeat vena cavagram demonstrates complete removal of thrombus from the inferior vena cava. Attention was next turned to the occluded left common iliac artery. The perfusion circuit was reversed and the reperfusion catheter was placed in the right femoral sheath and the angio back device was advanced through the right IJ sheath. Suction thrombectomy was performed in the right common iliac vein. Working from the popliteal access, angioplasty was performed of the common femoral, external iliac and common iliac veins and thrombus was pushed into the AngioVac. There appears to be a significant stenosis at the transition from the common iliac to the external iliac vein. This was resistant to initial angioplasty, so a 14 x 60 mm wall stent was selected, placed across the lesion and deployed. The wall stent was then post dilated to 12 mm using the 12 by 40 Conquest balloon. Catheter venography demonstrates significant near occlusive thrombus throughout the common femoral vein, and femoral vein in the thigh. Therefore, the decision was made to proceed with additional thrombectomy from the popliteal access using the [facility]. The 6 French left popliteal sheath was exchanged  over the wire for a 9 French sheath. The is a long take catheter was advanced over the wire and real lytic thrombectomy performed throughout the femoral, common femoral and  iliac veins. Follow-up angiography demonstrates significant improvement with restoration of in-line flow. There is a significant residual stenosis at the common femoral vein. This was angioplastied to 12 mm using the 12 x 40 mm Conquest balloon. On the final angiographic runs, there is successful restoration of in-line flow from the popliteal access through the IVC as well as successful recanalization of both iliac veins. All wires and devices were removed. Once the patient's ACT drop below 200, the sheaths were removed. Hemostasis was attained with a combination of manual pressure and pursestring sutures at both 26 [place] sheath sites. 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 Significant stenosis at the transition from the left common to the left external iliac vein 3. 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 Successful recanalization and angioplasty of the chronically occluded right common iliac vein 2. Successful thrombolysis/thrombectomy of the inferior vena cava, left-sided iliac veins and left lower extremity deep venous system to the level of the popliteal vein. 3. Stent assisted angioplasty of a flow limiting stenosis at the interface of the left common and external iliac veins. 4. Angioplasty of a stenotic segment of the left common femoral vein. 5. The IVC filter was retrieved.  No additional filter was placed. PLAN: 1. Continue full-dose heparin and transitioned to weight based Lovenox prior to discharge. 2. Progress toward ambulation as soon as clinically possible. 3. Compression garment or Ace wraps to the left lower extremity if needed to facilitate edema reduction. 4. Follow-up with Dr. Laurence Ferrari in interventional radiology clinic in 2 weeks. 5. If patient demonstrates any evidence of  bleeding and needs to come off of his anticoagulation in the future, he must have an IVC filter replaced. Signed, Criselda Peaches, MD [facility] Radiology Electronically Signed   By: Jacqulynn Cadet M.D.   On: 01/07/2018 10:46   Ir US Guide Vasc Access Right  Result Date: 01/07/2018 INDICATION: 57 year-old male with metastatic colon cancer an acute severe iliocaval and left lower extremity DVT. He presents for thrombectomy. EXAM: 1. Ultrasound-guided vascular access left popliteal vein 2. Ultrasound-guided access right common femoral vein 3. Ultrasound-guided access right internal jugular vein 4. Second ultrasound-guided access right internal jugular vein 5. Left lower extremity venogram 6. Right lower extremity venogram 7. Inferior vena cavogram 8. Combined pharmacomechanical thrombolysis/thrombectomy using tPA, the AngioVac device, and the Angiojet device 9. IVC filter retrieval 10. Recanalization and balloon angioplasty of the right common iliac vein 11. Recanalization and angioplasty with subsequent stent placement in the left iliac vein 12. Balloon angioplasty of the left common femoral and femoral veins Operating Physicians: Corrie Mckusick, DO and Jacqulynn Cadet, MD COMPARISON:  CT scan 12/30/2017 MEDICATIONS: 12 mg tPA administered directly into thrombus throughout the course of the procedure ANESTHESIA/SEDATION: General anesthesia provided by the anesthesiology service FLUOROSCOPY TIME:  Fluoroscopy Time: 67 minutes 6 seconds (816.6 mGy). COMPLICATIONS: None immediate. TECHNIQUE: Informed written consent was obtained from the patient after a thorough discussion of the procedural risks, benefits and alternatives. All questions were addressed. Maximal Sterile Barrier Technique was utilized including caps, mask, sterile gowns, sterile gloves, sterile drape, hand hygiene and skin antiseptic. A timeout was performed prior to the initiation of the  procedure. The patient was initially positioned prone. The left popliteal vein was interrogated with ultrasound and found to be widely patent. An image was obtained and stored for the medical record. Local anesthesia was attained by infiltration with 1% lidocaine. A small dermatotomy  was made. Under real-time sonographic guidance, the vessel was punctured with a 21 gauge micropuncture needle. Using standard technique, the initial micro needle was exchanged over a 0.018 micro wire for a transitional 4 [place] micro sheath. The micro sheath was then exchanged over a 0.035 wire for a 45 cm 6 [place] braided Arrow sheath. A left lower extremity venogram was performed. The femoral vein is completely occluded in the thigh. The popliteal vein remains patent. 6 mg tPA was laced throughout the thrombus within the common and femoral veins. The patient was repositioned supine and intubated. The right common femoral was interrogated with ultrasound and found to be patent with changes of chronic DVT. An image was obtained and stored for the medical record. Local anesthesia was attained by infiltration with 1% lidocaine. A small dermatotomy was made. Under real-time sonographic guidance, the vessel was punctured with a 21 gauge micropuncture needle. Using standard technique, the initial micro needle was exchanged over a 0.018 micro wire for a transitional 4 [place] micro sheath. The micro sheath was then exchanged over a 0.035 wire for a 6 French vascular sheath. Right lower extremity venography was performed. The common femoral and external iliac veins are patent. 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 All venous drainage is via parallel spinous collaterals. An angled catheter and glidewire were introduced through the sheath. With some manipulation, the catheter was successfully advanced into the suprarenal IVC. An inferior vena cavagram was performed confirming patency of both renal veins and the super renal IVC. A  superstiff Amplatz wire was advanced into the right heart. A 10 x 40 mm Conquest balloon was introduced over the wire the occluded segment of the common iliac vein was angioplastied. Repeat venography demonstrates recreation of a small vascular channel. A 10 x 40 mm Conquest balloon was then selected and introduced over the wire. Venography demonstrates successful recanalization of the common iliac vein. There is flow through the IVC. 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 The 6 French sheath was exchanged in the soft tissue tract dilated to 24 [place]. A 26 French Gore dry seal sheath was then advanced over the wire and positioned in the right common iliac vein. The right internal jugular vein was interrogated with ultrasound and found to be widely patent. An image was obtained and stored for the medical record. Local anesthesia was attained by infiltration with 1% lidocaine. A small dermatotomy was made. Under real-time sonographic guidance, the vessel was punctured with a 21 gauge micropuncture needle. Using standard technique, the initial micro needle was exchanged over a 0.018 micro wire for a transitional 4 [place] micro sheath. The micro sheath was then exchanged over a 0.035 wire for a 6 French vascular sheath. A superstiff Amplatz wire was advanced into the suprarenal IVC. The skin tract was then serially dilated and an 78 French reperfusion catheter was advanced and position with the tip at the cavoatrial junction. At this time, the patient was fully heparinized until the ACT level was greater than 300 and the AngioVac circuit was established. The angio [facility] device was advanced through the 65 [place] sheath from the right common femoral approach and suction thrombectomy was performed throughout the infrarenal inferior vena cava. Working from the popliteal access, additional left lower extremity venography images were performed focusing on the iliac and  common femoral vasculature. Thrombus is present within the common femoral vein and the external iliac vein. The left common iliac vein is completely occluded similar to  the other side. Attempts were made to pass through the occluded left common iliac vein from below. This was unsuccessful. Therefore, a second right internal jugular venous access was required. The right internal jugular vein was was again interrogated with ultrasound and found to be widely patent. An image was obtained and stored for the medical record. Local anesthesia was attained by infiltration with 1% lidocaine. A small dermatotomy was made. Under real-time sonographic guidance, the vessel was punctured with a 21 gauge micropuncture needle. Using standard technique, the initial micro needle was exchanged over a 0.018 micro wire for a transitional 4 [place] micro sheath. The micro sheath was then exchanged over a 0.035 wire for a 6 French vascular sheath. An angled catheter and glidewire were advanced through the sheath, through the IVC filter, through the infrarenal IVC and the catheter successfully navigated across the chronically occluded left common iliac vein. The glidewire was then exchanged for an exchange length Glidewire and snared from the femoral vein in the midthigh and brought through the popliteal sheath access resulting in successful through and through access from the right internal jugular vein through the left popliteal vein. A catheter was then advanced over the wire into the femoral vein and the glidewire exchanged for a superstiff Amplatz wire. At this time, another cavagram was performed demonstrating successful debulking of the caval thrombus. At this point it was decided to proceed with removal of the IVC filter. Over the superstiff Amplatz wire from the right jugular vein approach, the skin tract was dilated and a second 26 [place] Gore dry seal sheath was advanced and positioned in the suprarenal IVC. A Bard IVC filter  retrieval set was then advanced coaxially through this device next to the Amplatz wire. The Bard [place] IVC filter was successfully retrieved using standard technique. Additional thrombectomy was then performed using the angio VAC device. This yielded significant material. Repeat vena cavagram demonstrates complete removal of thrombus from the inferior vena cava. Attention was next turned to the occluded left common iliac artery. The perfusion circuit was reversed and the reperfusion catheter was placed in the right femoral sheath and the angio back device was advanced through the right IJ sheath. Suction thrombectomy was performed in the right common iliac vein. Working from the popliteal access, angioplasty was performed of the common femoral, external iliac and common iliac veins and thrombus was pushed into the AngioVac. There appears to be a significant stenosis at the transition from the common iliac to the external iliac vein. This was resistant to initial angioplasty, so a 14 x 60 mm wall stent was selected, placed across the lesion and deployed. The wall stent was then post dilated to 12 mm using the 12 by 40 Conquest balloon. Catheter venography demonstrates significant near occlusive thrombus throughout the common femoral vein, and femoral vein in the thigh. Therefore, the decision was made to proceed with additional thrombectomy from the popliteal access using the [facility]. The 6 French left popliteal sheath was exchanged over the wire for a 9 French sheath. The is a long take catheter was advanced over the wire and real lytic thrombectomy performed throughout the femoral, common femoral and iliac veins. Follow-up angiography demonstrates significant improvement with restoration of in-line flow. There is a significant residual stenosis at the common femoral vein. This was angioplastied to 12 mm using the 12 x 40 mm Conquest balloon. On the final angiographic runs, there is  successful restoration of in-line flow from the popliteal access through the  IVC as well as successful recanalization of both iliac veins. All wires and devices were removed. Once the patient's ACT drop below 200, the sheaths were removed. Hemostasis was attained with a combination of manual pressure and pursestring sutures at both 26 [place] sheath sites. 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 Significant stenosis at the transition from the left common to the left external iliac vein 3. 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 Successful recanalization and angioplasty of the chronically occluded right common iliac vein 2. Successful thrombolysis/thrombectomy of the inferior vena cava, left-sided iliac veins and left lower extremity deep venous system to the level of the popliteal vein. 3. Stent assisted angioplasty of a flow limiting stenosis at the interface of the left common and external iliac veins. 4. Angioplasty of a stenotic segment of the left common femoral vein. 5. The IVC filter was retrieved.  No additional filter was placed. PLAN: 1. Continue full-dose heparin and transitioned to weight based Lovenox prior to discharge. 2. Progress toward ambulation as soon as clinically possible. 3. Compression garment or Ace wraps to the left lower extremity if needed to facilitate edema reduction. 4. Follow-up with Dr. Laurence Ferrari in interventional radiology clinic in 2 weeks. 5. If patient demonstrates any evidence of bleeding and needs to come off of his anticoagulation in the future, he must have an IVC filter replaced. Signed, Criselda Peaches, MD [facility] Radiology Electronically Signed   By: Jacqulynn Cadet M.D.   On: 01/07/2018 10:46   Dg Chest Port 1 View  Result Date: 01/07/2018 CLINICAL DATA:  Respiratory failure EXAM: PORTABLE CHEST 1 VIEW COMPARISON:  None. FINDINGS: The heart size and mediastinal  contours are within normal limits. Both lungs are clear. Endotracheal tube tip is 4.3 cm above the carina in satisfactory position. Port catheter tip is noted in the distal SVC. No effusion or pneumothorax. The visualized skeletal structures are unremarkable. IMPRESSION: Satisfactory support line and tubes.  Clear lungs. Electronically Signed   By: Ashley Royalty M.D.   On: 01/07/2018 03:30   Ir Pta Venous Except Dialysis Circuit  Result Date: 01/07/2018 INDICATION: 57 year-old male with metastatic colon cancer an acute severe iliocaval and left lower extremity DVT. He presents for thrombectomy. EXAM: 1. Ultrasound-guided vascular access left popliteal vein 2. Ultrasound-guided access right common femoral vein 3. Ultrasound-guided access right internal jugular vein 4. Second ultrasound-guided access right internal jugular vein 5. Left lower extremity venogram 6. Right lower extremity venogram 7. Inferior vena cavogram 8. Combined pharmacomechanical thrombolysis/thrombectomy using tPA, the AngioVac device, and the Angiojet device 9. IVC filter retrieval 10. Recanalization and balloon angioplasty of the right common iliac vein 11. Recanalization and angioplasty with subsequent stent placement in the left iliac vein 12. Balloon angioplasty of the left common femoral and femoral veins Operating Physicians: Corrie Mckusick, DO and Jacqulynn Cadet, MD COMPARISON:  CT scan 12/30/2017 MEDICATIONS: 12 mg tPA administered directly into thrombus throughout the course of the procedure ANESTHESIA/SEDATION: General anesthesia provided by the anesthesiology service FLUOROSCOPY TIME:  Fluoroscopy Time: 67 minutes 6 seconds (816.6 mGy). COMPLICATIONS: None immediate. TECHNIQUE: Informed written consent was obtained from the patient after a thorough discussion of the procedural risks, benefits and alternatives. All questions were addressed. Maximal Sterile Barrier Technique was utilized including caps, mask, sterile gowns, sterile  gloves, sterile drape, hand hygiene and skin antiseptic. A timeout was performed prior to the initiation of the procedure. The patient was initially  positioned prone. The left popliteal vein was interrogated with ultrasound and found to be widely patent. An image was obtained and stored for the medical record. Local anesthesia was attained by infiltration with 1% lidocaine. A small dermatotomy was made. Under real-time sonographic guidance, the vessel was punctured with a 21 gauge micropuncture needle. Using standard technique, the initial micro needle was exchanged over a 0.018 micro wire for a transitional 4 [place] micro sheath. The micro sheath was then exchanged over a 0.035 wire for a 45 cm 6 [place] braided Arrow sheath. A left lower extremity venogram was performed. The femoral vein is completely occluded in the thigh. The popliteal vein remains patent. 6 mg tPA was laced throughout the thrombus within the common and femoral veins. The patient was repositioned supine and intubated. The right common femoral was interrogated with ultrasound and found to be patent with changes of chronic DVT. An image was obtained and stored for the medical record. Local anesthesia was attained by infiltration with 1% lidocaine. A small dermatotomy was made. Under real-time sonographic guidance, the vessel was punctured with a 21 gauge micropuncture needle. Using standard technique, the initial micro needle was exchanged over a 0.018 micro wire for a transitional 4 [place] micro sheath. The micro sheath was then exchanged over a 0.035 wire for a 6 French vascular sheath. Right lower extremity venography was performed. The common femoral and external iliac veins are patent. 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 All venous drainage is via parallel spinous collaterals. An angled catheter and glidewire were introduced through the sheath. With some manipulation, the catheter was successfully advanced into the suprarenal IVC.  An inferior vena cavagram was performed confirming patency of both renal veins and the super renal IVC. A superstiff Amplatz wire was advanced into the right heart. A 10 x 40 mm Conquest balloon was introduced over the wire the occluded segment of the common iliac vein was angioplastied. Repeat venography demonstrates recreation of a small vascular channel. A 10 x 40 mm Conquest balloon was then selected and introduced over the wire. Venography demonstrates successful recanalization of the common iliac vein. There is flow through the IVC. 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 The 6 French sheath was exchanged in the soft tissue tract dilated to 24 [place]. A 26 [place] Gore dry seal sheath was then advanced over the wire and positioned in the right common iliac vein. The right internal jugular vein was interrogated with ultrasound and found to be widely patent. An image was obtained and stored for the medical record. Local anesthesia was attained by infiltration with 1% lidocaine. A small dermatotomy was made. Under real-time sonographic guidance, the vessel was punctured with a 21 gauge micropuncture needle. Using standard technique, the initial micro needle was exchanged over a 0.018 micro wire for a transitional 4 [place] micro sheath. The micro sheath was then exchanged over a 0.035 wire for a 6 French vascular sheath. A superstiff Amplatz wire was advanced into the suprarenal IVC. The skin tract was then serially dilated and an 23 French reperfusion catheter was advanced and position with the tip at the cavoatrial junction. At this time, the patient was fully heparinized until the ACT level was greater than 300 and the AngioVac circuit was established. The angio [facility] device was advanced through the 32 French sheath from the right common femoral approach and suction thrombectomy was performed throughout the infrarenal inferior vena cava. Working from the  popliteal access, additional  left lower extremity venography images were performed focusing on the iliac and common femoral vasculature. Thrombus is present within the common femoral vein and the external iliac vein. The left common iliac vein is completely occluded similar to the other side. Attempts were made to pass through the occluded left common iliac vein from below. This was unsuccessful. Therefore, a second right internal jugular venous access was required. The right internal jugular vein was was again interrogated with ultrasound and found to be widely patent. An image was obtained and stored for the medical record. Local anesthesia was attained by infiltration with 1% lidocaine. A small dermatotomy was made. Under real-time sonographic guidance, the vessel was punctured with a 21 gauge micropuncture needle. Using standard technique, the initial micro needle was exchanged over a 0.018 micro wire for a transitional 4 [place] micro sheath. The micro sheath was then exchanged over a 0.035 wire for a 6 French vascular sheath. An angled catheter and glidewire were advanced through the sheath, through the IVC filter, through the infrarenal IVC and the catheter successfully navigated across the chronically occluded left common iliac vein. The glidewire was then exchanged for an exchange length Glidewire and snared from the femoral vein in the midthigh and brought through the popliteal sheath access resulting in successful through and through access from the right internal jugular vein through the left popliteal vein. A catheter was then advanced over the wire into the femoral vein and the glidewire exchanged for a superstiff Amplatz wire. At this time, another cavagram was performed demonstrating successful debulking of the caval thrombus. At this point it was decided to proceed with removal of the IVC filter. Over the superstiff Amplatz wire from the right jugular vein approach, the skin tract was dilated and a  second 26 [place] Gore dry seal sheath was advanced and positioned in the suprarenal IVC. A Bard IVC filter retrieval set was then advanced coaxially through this device next to the Amplatz wire. The Bard Lamar IVC filter was successfully retrieved using standard technique. Additional thrombectomy was then performed using the angio VAC device. This yielded significant material. Repeat vena cavagram demonstrates complete removal of thrombus from the inferior vena cava. Attention was next turned to the occluded left common iliac artery. The perfusion circuit was reversed and the reperfusion catheter was placed in the right femoral sheath and the angio back device was advanced through the right IJ sheath. Suction thrombectomy was performed in the right common iliac vein. Working from the popliteal access, angioplasty was performed of the common femoral, external iliac and common iliac veins and thrombus was pushed into the AngioVac. There appears to be a significant stenosis at the transition from the common iliac to the external iliac vein. This was resistant to initial angioplasty, so a 14 x 60 mm wall stent was selected, placed across the lesion and deployed. The wall stent was then post dilated to 12 mm using the 12 by 40 Conquest balloon. Catheter venography demonstrates significant near occlusive thrombus throughout the common femoral vein, and femoral vein in the thigh. Therefore, the decision was made to proceed with additional thrombectomy from the popliteal access using the [facility]. The 6 French left popliteal sheath was exchanged over the wire for a 9 French sheath. The is a long take catheter was advanced over the wire and real lytic thrombectomy performed throughout the femoral, common femoral and iliac veins. Follow-up angiography demonstrates significant improvement with restoration of in-line flow. There is a significant residual stenosis  at the common femoral vein. This was  angioplastied to 12 mm using the 12 x 40 mm Conquest balloon. On the final angiographic runs, there is successful restoration of in-line flow from the popliteal access through the IVC as well as successful recanalization of both iliac veins. All wires and devices were removed. Once the patient's ACT drop below 200, the sheaths were removed. Hemostasis was attained with a combination of manual pressure and pursestring sutures at both 26 [place] sheath sites. 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 Significant stenosis at the transition from the left common to the left external iliac vein 3. 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 Successful recanalization and angioplasty of the chronically occluded right common iliac vein 2. Successful thrombolysis/thrombectomy of the inferior vena cava, left-sided iliac veins and left lower extremity deep venous system to the level of the popliteal vein. 3. Stent assisted angioplasty of a flow limiting stenosis at the interface of the left common and external iliac veins. 4. Angioplasty of a stenotic segment of the left common femoral vein. 5. The IVC filter was retrieved.  No additional filter was placed. PLAN: 1. Continue full-dose heparin and transitioned to weight based Lovenox prior to discharge. 2. Progress toward ambulation as soon as clinically possible. 3. Compression garment or Ace wraps to the left lower extremity if needed to facilitate edema reduction. 4. Follow-up with Dr. Laurence Ferrari in interventional radiology clinic in 2 weeks. 5. If patient demonstrates any evidence of bleeding and needs to come off of his anticoagulation in the future, he must have an IVC filter replaced. Signed, Criselda Peaches, MD [facility] Radiology Electronically Signed   By: Jacqulynn Cadet M.D.   On: 01/07/2018 10:46   Ir Pta Venous Addl Except Dialysis Circuit  Result Date:  01/07/2018 INDICATION: 57 year-old male with metastatic colon cancer an acute severe iliocaval and left lower extremity DVT. He presents for thrombectomy. EXAM: 1. Ultrasound-guided vascular access left popliteal vein 2. Ultrasound-guided access right common femoral vein 3. Ultrasound-guided access right internal jugular vein 4. Second ultrasound-guided access right internal jugular vein 5. Left lower extremity venogram 6. Right lower extremity venogram 7. Inferior vena cavogram 8. Combined pharmacomechanical thrombolysis/thrombectomy using tPA, the AngioVac device, and the Angiojet device 9. IVC filter retrieval 10. Recanalization and balloon angioplasty of the right common iliac vein 11. Recanalization and angioplasty with subsequent stent placement in the left iliac vein 12. Balloon angioplasty of the left common femoral and femoral veins Operating Physicians: Corrie Mckusick, DO and Jacqulynn Cadet, MD COMPARISON:  CT scan 12/30/2017 MEDICATIONS: 12 mg tPA administered directly into thrombus throughout the course of the procedure ANESTHESIA/SEDATION: General anesthesia provided by the anesthesiology service FLUOROSCOPY TIME:  Fluoroscopy Time: 67 minutes 6 seconds (816.6 mGy). COMPLICATIONS: None immediate. TECHNIQUE: Informed written consent was obtained from the patient after a thorough discussion of the procedural risks, benefits and alternatives. All questions were addressed. Maximal Sterile Barrier Technique was utilized including caps, mask, sterile gowns, sterile gloves, sterile drape, hand hygiene and skin antiseptic. A timeout was performed prior to the initiation of the procedure. The patient was initially positioned prone. The left popliteal vein was interrogated with ultrasound and found to be widely patent. An image was obtained and stored for the medical record. Local anesthesia was attained by infiltration with 1% lidocaine. A small dermatotomy was made. Under real-time sonographic guidance, the  vessel was punctured with a 21 gauge micropuncture needle.  Using standard technique, the initial micro needle was exchanged over a 0.018 micro wire for a transitional 4 [place] micro sheath. The micro sheath was then exchanged over a 0.035 wire for a 45 cm 6 [place] braided Arrow sheath. A left lower extremity venogram was performed. The femoral vein is completely occluded in the thigh. The popliteal vein remains patent. 6 mg tPA was laced throughout the thrombus within the common and femoral veins. The patient was repositioned supine and intubated. The right common femoral was interrogated with ultrasound and found to be patent with changes of chronic DVT. An image was obtained and stored for the medical record. Local anesthesia was attained by infiltration with 1% lidocaine. A small dermatotomy was made. Under real-time sonographic guidance, the vessel was punctured with a 21 gauge micropuncture needle. Using standard technique, the initial micro needle was exchanged over a 0.018 micro wire for a transitional 4 [place] micro sheath. The micro sheath was then exchanged over a 0.035 wire for a 6 French vascular sheath. Right lower extremity venography was performed. The common femoral and external iliac veins are patent. 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 All venous drainage is via parallel spinous collaterals. An angled catheter and glidewire were introduced through the sheath. With some manipulation, the catheter was successfully advanced into the suprarenal IVC. An inferior vena cavagram was performed confirming patency of both renal veins and the super renal IVC. A superstiff Amplatz wire was advanced into the right heart. A 10 x 40 mm Conquest balloon was introduced over the wire the occluded segment of the common iliac vein was angioplastied. Repeat venography demonstrates recreation of a small vascular channel. A 10 x 40 mm Conquest balloon was then selected and introduced over the wire. Venography  demonstrates successful recanalization of the common iliac vein. There is flow through the IVC. 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 The 6 French sheath was exchanged in the soft tissue tract dilated to 24 [place]. A 26 [place] Gore dry seal sheath was then advanced over the wire and positioned in the right common iliac vein. The right internal jugular vein was interrogated with ultrasound and found to be widely patent. An image was obtained and stored for the medical record. Local anesthesia was attained by infiltration with 1% lidocaine. A small dermatotomy was made. Under real-time sonographic guidance, the vessel was punctured with a 21 gauge micropuncture needle. Using standard technique, the initial micro needle was exchanged over a 0.018 micro wire for a transitional 4 [place] micro sheath. The micro sheath was then exchanged over a 0.035 wire for a 6 French vascular sheath. A superstiff Amplatz wire was advanced into the suprarenal IVC. The skin tract was then serially dilated and an 42 French reperfusion catheter was advanced and position with the tip at the cavoatrial junction. At this time, the patient was fully heparinized until the ACT level was greater than 300 and the AngioVac circuit was established. The angio [facility] device was advanced through the 37 [place] sheath from the right common femoral approach and suction thrombectomy was performed throughout the infrarenal inferior vena cava. Working from the popliteal access, additional left lower extremity venography images were performed focusing on the iliac and common femoral vasculature. Thrombus is present within the common femoral vein and the external iliac vein. The left common iliac vein is completely occluded similar to the other side. Attempts were made to pass through the occluded left common iliac vein from below.  This was unsuccessful. Therefore, a second right internal jugular venous  access was required. The right internal jugular vein was was again interrogated with ultrasound and found to be widely patent. An image was obtained and stored for the medical record. Local anesthesia was attained by infiltration with 1% lidocaine. A small dermatotomy was made. Under real-time sonographic guidance, the vessel was punctured with a 21 gauge micropuncture needle. Using standard technique, the initial micro needle was exchanged over a 0.018 micro wire for a transitional 4 [place] micro sheath. The micro sheath was then exchanged over a 0.035 wire for a 6 French vascular sheath. An angled catheter and glidewire were advanced through the sheath, through the IVC filter, through the infrarenal IVC and the catheter successfully navigated across the chronically occluded left common iliac vein. The glidewire was then exchanged for an exchange length Glidewire and snared from the femoral vein in the midthigh and brought through the popliteal sheath access resulting in successful through and through access from the right internal jugular vein through the left popliteal vein. A catheter was then advanced over the wire into the femoral vein and the glidewire exchanged for a superstiff Amplatz wire. At this time, another cavagram was performed demonstrating successful debulking of the caval thrombus. At this point it was decided to proceed with removal of the IVC filter. Over the superstiff Amplatz wire from the right jugular vein approach, the skin tract was dilated and a second 26 [place] Gore dry seal sheath was advanced and positioned in the suprarenal IVC. A Bard IVC filter retrieval set was then advanced coaxially through this device next to the Amplatz wire. The Bard [place] IVC filter was successfully retrieved using standard technique. Additional thrombectomy was then performed using the angio VAC device. This yielded significant material. Repeat vena cavagram demonstrates complete removal of thrombus from  the inferior vena cava. Attention was next turned to the occluded left common iliac artery. The perfusion circuit was reversed and the reperfusion catheter was placed in the right femoral sheath and the angio back device was advanced through the right IJ sheath. Suction thrombectomy was performed in the right common iliac vein. Working from the popliteal access, angioplasty was performed of the common femoral, external iliac and common iliac veins and thrombus was pushed into the AngioVac. There appears to be a significant stenosis at the transition from the common iliac to the external iliac vein. This was resistant to initial angioplasty, so a 14 x 60 mm wall stent was selected, placed across the lesion and deployed. The wall stent was then post dilated to 12 mm using the 12 by 40 Conquest balloon. Catheter venography demonstrates significant near occlusive thrombus throughout the common femoral vein, and femoral vein in the thigh. Therefore, the decision was made to proceed with additional thrombectomy from the popliteal access using the [facility]. The 6 French left popliteal sheath was exchanged over the wire for a 9 French sheath. The is a long take catheter was advanced over the wire and real lytic thrombectomy performed throughout the femoral, common femoral and iliac veins. Follow-up angiography demonstrates significant improvement with restoration of in-line flow. There is a significant residual stenosis at the common femoral vein. This was angioplastied to 12 mm using the 12 x 40 mm Conquest balloon. On the final angiographic runs, there is successful restoration of in-line flow from the popliteal access through the IVC as well as successful recanalization of both iliac veins. All wires and devices were removed. Once  the patient's ACT drop below 200, the sheaths were removed. Hemostasis was attained with a combination of manual pressure and pursestring sutures at both 26 [place]  sheath sites. 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 Significant stenosis at the transition from the left common to the left external iliac vein 3. 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 Successful recanalization and angioplasty of the chronically occluded right common iliac vein 2. Successful thrombolysis/thrombectomy of the inferior vena cava, left-sided iliac veins and left lower extremity deep venous system to the level of the popliteal vein. 3. Stent assisted angioplasty of a flow limiting stenosis at the interface of the left common and external iliac veins. 4. Angioplasty of a stenotic segment of the left common femoral vein. 5. The IVC filter was retrieved.  No additional filter was placed. PLAN: 1. Continue full-dose heparin and transitioned to weight based Lovenox prior to discharge. 2. Progress toward ambulation as soon as clinically possible. 3. Compression garment or Ace wraps to the left lower extremity if needed to facilitate edema reduction. 4. Follow-up with Dr. Laurence Ferrari in interventional radiology clinic in 2 weeks. 5. If patient demonstrates any evidence of bleeding and needs to come off of his anticoagulation in the future, he must have an IVC filter replaced. Signed, Criselda Peaches, MD [facility] Electronically Signed   By: Jacqulynn Cadet M.D.   On: 01/07/2018 10:46   Ir [employer] Stent  Initial Vein  Inc Angioplasty  Result Date: 01/07/2018 INDICATION: 57 year-old male with metastatic colon cancer an acute severe iliocaval and left lower extremity DVT. He presents for thrombectomy. EXAM: 1. Ultrasound-guided vascular access left popliteal vein 2. Ultrasound-guided access right common femoral vein 3. Ultrasound-guided access right internal jugular vein 4. Second ultrasound-guided access right internal jugular vein 5. Left lower extremity venogram 6. Right lower extremity  venogram 7. Inferior vena cavogram 8. Combined pharmacomechanical thrombolysis/thrombectomy using tPA, the AngioVac device, and the Angiojet device 9. IVC filter retrieval 10. Recanalization and balloon angioplasty of the right common iliac vein 11. Recanalization and angioplasty with subsequent stent placement in the left iliac vein 12. Balloon angioplasty of the left common femoral and femoral veins Operating Physicians: Corrie Mckusick, DO and Jacqulynn Cadet, MD COMPARISON:  CT scan 12/30/2017 MEDICATIONS: 12 mg tPA administered directly into thrombus throughout the course of the procedure ANESTHESIA/SEDATION: General anesthesia provided by the anesthesiology service FLUOROSCOPY TIME:  Fluoroscopy Time: 67 minutes 6 seconds (816.6 mGy). COMPLICATIONS: None immediate. TECHNIQUE: Informed written consent was obtained from the patient after a thorough discussion of the procedural risks, benefits and alternatives. All questions were addressed. Maximal Sterile Barrier Technique was utilized including caps, mask, sterile gowns, sterile gloves, sterile drape, hand hygiene and skin antiseptic. A timeout was performed prior to the initiation of the procedure. The patient was initially positioned prone. The left popliteal vein was interrogated with ultrasound and found to be widely patent. An image was obtained and stored for the medical record. Local anesthesia was attained by infiltration with 1% lidocaine. A small dermatotomy was made. Under real-time sonographic guidance, the vessel was punctured with a 21 gauge micropuncture needle. Using standard technique, the initial micro needle was exchanged over a 0.018 micro wire for a transitional 4 [place] micro sheath. The micro sheath was then exchanged over a 0.035 wire for a 45 cm 6 [place] braided Arrow sheath. A left lower extremity venogram was performed. The femoral vein is completely  occluded in the thigh. The popliteal vein remains patent. 6 mg tPA was laced throughout  the thrombus within the common and femoral veins. The patient was repositioned supine and intubated. The right common femoral was interrogated with ultrasound and found to be patent with changes of chronic DVT. An image was obtained and stored for the medical record. Local anesthesia was attained by infiltration with 1% lidocaine. A small dermatotomy was made. Under real-time sonographic guidance, the vessel was punctured with a 21 gauge micropuncture needle. Using standard technique, the initial micro needle was exchanged over a 0.018 micro wire for a transitional 4 [place] micro sheath. The micro sheath was then exchanged over a 0.035 wire for a 6 French vascular sheath. Right lower extremity venography was performed. The common femoral and external iliac veins are patent. 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 All venous drainage is via parallel spinous collaterals. An angled catheter and glidewire were introduced through the sheath. With some manipulation, the catheter was successfully advanced into the suprarenal IVC. An inferior vena cavagram was performed confirming patency of both renal veins and the super renal IVC. A superstiff Amplatz wire was advanced into the right heart. A 10 x 40 mm Conquest balloon was introduced over the wire the occluded segment of the common iliac vein was angioplastied. Repeat venography demonstrates recreation of a small vascular channel. A 10 x 40 mm Conquest balloon was then selected and introduced over the wire. Venography demonstrates successful recanalization of the common iliac vein. There is flow through the IVC. 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 The 6 French sheath was exchanged in the soft tissue tract dilated to 24 [place]. A 26 French Gore dry seal sheath was then advanced over the wire and positioned in the right common iliac vein. The right internal jugular vein was interrogated with ultrasound and  found to be widely patent. An image was obtained and stored for the medical record. Local anesthesia was attained by infiltration with 1% lidocaine. A small dermatotomy was made. Under real-time sonographic guidance, the vessel was punctured with a 21 gauge micropuncture needle. Using standard technique, the initial micro needle was exchanged over a 0.018 micro wire for a transitional 4 [place] micro sheath. The micro sheath was then exchanged over a 0.035 wire for a 6 French vascular sheath. A superstiff Amplatz wire was advanced into the suprarenal IVC. The skin tract was then serially dilated and an 19 French reperfusion catheter was advanced and position with the tip at the cavoatrial junction. At this time, the patient was fully heparinized until the ACT level was greater than 300 and the AngioVac circuit was established. The angio [facility] device was advanced through the 38 French sheath from the right common femoral approach and suction thrombectomy was performed throughout the infrarenal inferior vena cava. Working from the popliteal access, additional left lower extremity venography images were performed focusing on the iliac and common femoral vasculature. Thrombus is present within the common femoral vein and the external iliac vein. The left common iliac vein is completely occluded similar to the other side. Attempts were made to pass through the occluded left common iliac vein from below. This was unsuccessful. Therefore, a second right internal jugular venous access was required. The right internal jugular vein was was again interrogated with ultrasound and found to be widely patent. An image was obtained and stored for the medical record. Local anesthesia was attained by infiltration with 1% lidocaine. A small  dermatotomy was made. Under real-time sonographic guidance, the vessel was punctured with a 21 gauge micropuncture needle. Using standard technique, the initial micro needle was exchanged over a 0.018  micro wire for a transitional 4 [place] micro sheath. The micro sheath was then exchanged over a 0.035 wire for a 6 French vascular sheath. An angled catheter and glidewire were advanced through the sheath, through the IVC filter, through the infrarenal IVC and the catheter successfully navigated across the chronically occluded left common iliac vein. The glidewire was then exchanged for an exchange length Glidewire and snared from the femoral vein in the midthigh and brought through the popliteal sheath access resulting in successful through and through access from the right internal jugular vein through the left popliteal vein. A catheter was then advanced over the wire into the femoral vein and the glidewire exchanged for a superstiff Amplatz wire. At this time, another cavagram was performed demonstrating successful debulking of the caval thrombus. At this point it was decided to proceed with removal of the IVC filter. Over the superstiff Amplatz wire from the right jugular vein approach, the skin tract was dilated and a second 26 [place] Gore dry seal sheath was advanced and positioned in the suprarenal IVC. A Bard IVC filter retrieval set was then advanced coaxially through this device next to the Amplatz wire. The Bard [place] IVC filter was successfully retrieved using standard technique. Additional thrombectomy was then performed using the angio VAC device. This yielded significant material. Repeat vena cavagram demonstrates complete removal of thrombus from the inferior vena cava. Attention was next turned to the occluded left common iliac artery. The perfusion circuit was reversed and the reperfusion catheter was placed in the right femoral sheath and the angio back device was advanced through the right IJ sheath. Suction thrombectomy was performed in the right common iliac vein. Working from the popliteal access, angioplasty was performed of the common femoral, external iliac and common iliac veins and  thrombus was pushed into the AngioVac. There appears to be a significant stenosis at the transition from the common iliac to the external iliac vein. This was resistant to initial angioplasty, so a 14 x 60 mm wall stent was selected, placed across the lesion and deployed. The wall stent was then post dilated to 12 mm using the 12 by 40 Conquest balloon. Catheter venography demonstrates significant near occlusive thrombus throughout the common femoral vein, and femoral vein in the thigh. Therefore, the decision was made to proceed with additional thrombectomy from the popliteal access using the [facility]. The 6 French left popliteal sheath was exchanged over the wire for a 9 French sheath. The is a long take catheter was advanced over the wire and real lytic thrombectomy performed throughout the femoral, common femoral and iliac veins. Follow-up angiography demonstrates significant improvement with restoration of in-line flow. There is a significant residual stenosis at the common femoral vein. This was angioplastied to 12 mm using the 12 x 40 mm Conquest balloon. On the final angiographic runs, there is successful restoration of in-line flow from the popliteal access through the IVC as well as successful recanalization of both iliac veins. All wires and devices were removed. Once the patient's ACT drop below 200, the sheaths were removed. Hemostasis was attained with a combination of manual pressure and pursestring sutures at both 26 [place] sheath sites. 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 Significant stenosis at the transition from the left common to the left  external iliac vein 3. 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 Successful recanalization and angioplasty of the chronically occluded right common iliac vein 2. Successful thrombolysis/thrombectomy of the inferior vena cava, left-sided iliac veins and left lower extremity deep venous  system to the level of the popliteal vein. 3. Stent assisted angioplasty of a flow limiting stenosis at the interface of the left common and external iliac veins. 4. Angioplasty of a stenotic segment of the left common femoral vein. 5. The IVC filter was retrieved.  No additional filter was placed. PLAN: 1. Continue full-dose heparin and transitioned to weight based Lovenox prior to discharge. 2. Progress toward ambulation as soon as clinically possible. 3. Compression garment or Ace wraps to the left lower extremity if needed to facilitate edema reduction. 4. Follow-up with Dr. Laurence Ferrari in interventional radiology clinic in 2 weeks. 5. If patient demonstrates any evidence of bleeding and needs to come off of his anticoagulation in the future, he must have an IVC filter replaced. Signed, Criselda Peaches, MD [facility] Electronically Signed   By: Jacqulynn Cadet M.D.   On: 01/07/2018 10:46   Ir Ivc Filter Retrieval / S&i /img Guid/mod Sed  Result Date: 01/07/2018 INDICATION: 57 year-old male with metastatic colon cancer an acute severe iliocaval and left lower extremity DVT. He presents for thrombectomy. EXAM: 1. Ultrasound-guided vascular access left popliteal vein 2. Ultrasound-guided access right common femoral vein 3. Ultrasound-guided access right internal jugular vein 4. Second ultrasound-guided access right internal jugular vein 5. Left lower extremity venogram 6. Right lower extremity venogram 7. Inferior vena cavogram 8. Combined pharmacomechanical thrombolysis/thrombectomy using tPA, the AngioVac device, and the Angiojet device 9. IVC filter retrieval 10. Recanalization and balloon angioplasty of the right common iliac vein 11. Recanalization and angioplasty with subsequent stent placement in the left iliac vein 12. Balloon angioplasty of the left common femoral and femoral veins Operating Physicians: Corrie Mckusick, DO and Jacqulynn Cadet, MD COMPARISON:  CT scan 12/30/2017 MEDICATIONS: 12 mg tPA administered directly into thrombus throughout the course of the procedure ANESTHESIA/SEDATION: General anesthesia provided by the anesthesiology service FLUOROSCOPY TIME:  Fluoroscopy Time: 67 minutes 6 seconds (816.6 mGy). COMPLICATIONS: None immediate. TECHNIQUE: Informed written consent was obtained from the patient after a thorough discussion of the procedural risks, benefits and alternatives. All questions were addressed. Maximal Sterile Barrier Technique was utilized including caps, mask, sterile gowns, sterile gloves, sterile drape, hand hygiene and skin antiseptic. A timeout was performed prior to the initiation of the procedure. The patient was initially positioned prone. The left popliteal vein was interrogated with ultrasound and found to be widely patent. An image was obtained and stored for the medical record. Local anesthesia was attained by infiltration with 1% lidocaine. A small dermatotomy was made. Under real-time sonographic guidance, the vessel was punctured with a 21 gauge micropuncture needle. Using standard technique, the initial micro needle was exchanged over a 0.018 micro wire for a transitional 4 [place] micro sheath. The micro sheath was then exchanged over a 0.035 wire for a 45 cm 6 [place] braided Arrow sheath. A left lower extremity venogram was performed. The femoral vein is completely occluded in the thigh. The popliteal vein remains patent. 6 mg tPA was laced throughout the thrombus within the common and femoral veins. The patient was repositioned supine and intubated. The right common femoral was interrogated with ultrasound and found to be patent with changes of chronic DVT. An image was obtained  and stored for the medical record. Local anesthesia was attained by infiltration with 1% lidocaine. A small dermatotomy was made. Under real-time sonographic guidance, the vessel was punctured with a 21 gauge  micropuncture needle. Using standard technique, the initial micro needle was exchanged over a 0.018 micro wire for a transitional 4 [place] micro sheath. The micro sheath was then exchanged over a 0.035 wire for a 6 French vascular sheath. Right lower extremity venography was performed. The common femoral and external iliac veins are patent. 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 All venous drainage is via parallel spinous collaterals. An angled catheter and glidewire were introduced through the sheath. With some manipulation, the catheter was successfully advanced into the suprarenal IVC. An inferior vena cavagram was performed confirming patency of both renal veins and the super renal IVC. A superstiff Amplatz wire was advanced into the right heart. A 10 x 40 mm Conquest balloon was introduced over the wire the occluded segment of the common iliac vein was angioplastied. Repeat venography demonstrates recreation of a small vascular channel. A 10 x 40 mm Conquest balloon was then selected and introduced over the wire. Venography demonstrates successful recanalization of the common iliac vein. There is flow through the IVC. 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 The 6 French sheath was exchanged in the soft tissue tract dilated to 24 [place]. A 26 French Gore dry seal sheath was then advanced over the wire and positioned in the right common iliac vein. The right internal jugular vein was interrogated with ultrasound and found to be widely patent. An image was obtained and stored for the medical record. Local anesthesia was attained by infiltration with 1% lidocaine. A small dermatotomy was made. Under real-time sonographic guidance, the vessel was punctured with a 21 gauge micropuncture needle. Using standard technique, the initial micro needle was exchanged over a 0.018 micro wire for a transitional 4 [place] micro sheath. The micro sheath was then  exchanged over a 0.035 wire for a 6 French vascular sheath. A superstiff Amplatz wire was advanced into the suprarenal IVC. The skin tract was then serially dilated and an 26 French reperfusion catheter was advanced and position with the tip at the cavoatrial junction. At this time, the patient was fully heparinized until the ACT level was greater than 300 and the AngioVac circuit was established. The angio [facility] device was advanced through the 29 French sheath from the right common femoral approach and suction thrombectomy was performed throughout the infrarenal inferior vena cava. Working from the popliteal access, additional left lower extremity venography images were performed focusing on the iliac and common femoral vasculature. Thrombus is present within the common femoral vein and the external iliac vein. The left common iliac vein is completely occluded similar to the other side. Attempts were made to pass through the occluded left common iliac vein from below. This was unsuccessful. Therefore, a second right internal jugular venous access was required. The right internal jugular vein was was again interrogated with ultrasound and found to be widely patent. An image was obtained and stored for the medical record. Local anesthesia was attained by infiltration with 1% lidocaine. A small dermatotomy was made. Under real-time sonographic guidance, the vessel was punctured with a 21 gauge micropuncture needle. Using standard technique, the initial micro needle was exchanged over a 0.018 micro wire for a transitional 4 [place] micro sheath. The micro sheath was then exchanged over a 0.035 wire for a 6 [place]  vascular sheath. An angled catheter and glidewire were advanced through the sheath, through the IVC filter, through the infrarenal IVC and the catheter successfully navigated across the chronically occluded left common iliac vein. The glidewire was then exchanged for an exchange length Glidewire and snared from  the femoral vein in the midthigh and brought through the popliteal sheath access resulting in successful through and through access from the right internal jugular vein through the left popliteal vein. A catheter was then advanced over the wire into the femoral vein and the glidewire exchanged for a superstiff Amplatz wire. At this time, another cavagram was performed demonstrating successful debulking of the caval thrombus. At this point it was decided to proceed with removal of the IVC filter. Over the superstiff Amplatz wire from the right jugular vein approach, the skin tract was dilated and a second 26 [place] Gore dry seal sheath was advanced and positioned in the suprarenal IVC. A Bard IVC filter retrieval set was then advanced coaxially through this device next to the Amplatz wire. The Bard [place] IVC filter was successfully retrieved using standard technique. Additional thrombectomy was then performed using the angio VAC device. This yielded significant material. Repeat vena cavagram demonstrates complete removal of thrombus from the inferior vena cava. Attention was next turned to the occluded left common iliac artery. The perfusion circuit was reversed and the reperfusion catheter was placed in the right femoral sheath and the angio back device was advanced through the right IJ sheath. Suction thrombectomy was performed in the right common iliac vein. Working from the popliteal access, angioplasty was performed of the common femoral, external iliac and common iliac veins and thrombus was pushed into the AngioVac. There appears to be a significant stenosis at the transition from the common iliac to the external iliac vein. This was resistant to initial angioplasty, so a 14 x 60 mm wall stent was selected, placed across the lesion and deployed. The wall stent was then post dilated to 12 mm using the 12 by 40 Conquest balloon. Catheter venography demonstrates significant near occlusive thrombus throughout the  common femoral vein, and femoral vein in the thigh. Therefore, the decision was made to proceed with additional thrombectomy from the popliteal access using the [facility]. The 6 French left popliteal sheath was exchanged over the wire for a 9 French sheath. The is a long take catheter was advanced over the wire and real lytic thrombectomy performed throughout the femoral, common femoral and iliac veins. Follow-up angiography demonstrates significant improvement with restoration of in-line flow. There is a significant residual stenosis at the common femoral vein. This was angioplastied to 12 mm using the 12 x 40 mm Conquest balloon. On the final angiographic runs, there is successful restoration of in-line flow from the popliteal access through the IVC as well as successful recanalization of both iliac veins. All wires and devices were removed. Once the patient's ACT drop below 200, the sheaths were removed. Hemostasis was attained with a combination of manual pressure and pursestring sutures at both 26 [place] sheath sites. 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 Significant stenosis at the transition from the left common to the left external iliac vein 3. 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 Successful recanalization and angioplasty of the chronically occluded right common iliac vein 2. Successful thrombolysis/thrombectomy of the inferior vena cava, left-sided iliac veins and left lower extremity deep venous system to the level of the popliteal vein. 3.  Stent assisted angioplasty of a flow limiting stenosis at the interface of the left common and external iliac veins. 4. Angioplasty of a stenotic segment of the left common femoral vein. 5. The IVC filter was retrieved.  No additional filter was placed. PLAN: 1. Continue full-dose heparin and transitioned to weight based Lovenox prior to discharge. 2. Progress toward ambulation as soon as  clinically possible. 3. Compression garment or Ace wraps to the left lower extremity if needed to facilitate edema reduction. 4. Follow-up with Dr. Laurence Ferrari in interventional radiology clinic in 2 weeks. 5. If patient demonstrates any evidence of bleeding and needs to come off of his anticoagulation in the future, he must have an IVC filter replaced. Signed, Criselda Peaches, MD [facility] Radiology Electronically Signed   By: Jacqulynn Cadet M.D.   On: 01/07/2018 10:46    Scheduled Meds: . chlorhexidine gluconate (MEDLINE KIT)  15 mL Mouth Rinse BID  . Chlorhexidine Gluconate Cloth  6 each Topical Daily  . docusate  100 mg Per Tube BID  . feeding supplement (ENSURE ENLIVE)  120 mL Oral TID  . gabapentin  100 mg Per Tube Q8H  . mouth rinse  15 mL Mouth Rinse QID  . mirtazapine  15 mg Per Tube QHS  . multivitamin with minerals  1 tablet Oral Daily  . mupirocin ointment  1 application Nasal BID  . pantoprazole (PROTONIX) IV  40 mg Intravenous Q12H    Continuous Infusions: . heparin 800 Units/hr (01/07/18 0228)     LOS: 6 days     Kayleen Memos, MD [facility] Pager [phone]  If 7PM-7AM, please contact night-coverage [email or web address] Password [facility] 01/07/2018, 12:21 PM

## 2018-01-07 NOTE — Progress Notes (Signed)
Nutrition Follow-up  DOCUMENTATION CODES:   Severe malnutrition in context of chronic illness  INTERVENTION:   Continue Ensure Enlive po TID, each supplement provides 350 kcal and 20 grams of protein  NUTRITION DIAGNOSIS:   Severe Malnutrition related to cancer and cancer related treatments, chronic illness, catabolic illness as evidenced by severe fat depletion, severe muscle depletion.  Ongoing   GOAL:   Patient will meet greater than or equal to 90% of their needs  Progressing  MONITOR:   PO intake, Supplement acceptance, Weight trends, Labs, I & O's  ASSESSMENT:   57 y.o. 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  Pt extubated this morning, diet advanced to heart healthy at 1045. Per chart, pt consumed 100% of lunch tray.  Palliative care consulted for GOC and symptom management.  Pt weight increased since last RD visit. Per chart, pt net positive 5 L since admission.   Spoke with pt at bedside continues to report no nutrition impact symptoms.  Reports a good appetite this afternoon and was willing to consume an Ensure, RN provided.   Labs reviewed; CBG 138, Hemoglobin 7.5 Medications reviewed; Remeron, multivitamin, Protonix, Colace   Diet Order:  Diet Heart Room service appropriate? Yes; Fluid consistency: Thin  EDUCATION NEEDS:   No education needs have been identified at this time  Skin:  Skin Assessment: Reviewed RN Assessment  Last BM:  01/07/18  Height:   Ht Readings from Last 1 Encounters:  01/05/18 5\' 9"  (1.753 m)   Weight:   Wt Readings from Last 1 Encounters:  01/06/18 139 lb 8.8 oz (63.3 kg)   Ideal Body Weight:  72.7 kg  BMI:  Body mass index is 20.61 kg/m.  Estimated Nutritional Needs:    Kcal:  1800-2000  Protein:  90-100g  Fluid:  2L/day  Parks Ranger, MS, RDN, LDN 01/07/2018 3:42 PM

## 2018-01-07 NOTE — Progress Notes (Signed)
Extubated earlier at 40. Patient has done well extubated.  Weaned off o2. Patient has various complaints, of being cold, having some fluid on his skin, ( had sputum  From extubation) met patient needs with each complaint, full bath given. Feet dry , moisturized with lotion. Taking ice chips and tolerated well, advancing diet. Daughter called with update. Pulses doppler in feet, legs elevated on pillow, heels off bed, discussed with pharmacy heparin fractionation. Orders in for transfer.

## 2018-01-07 NOTE — Progress Notes (Signed)
Extubated pt per order.. Placed on 2L [place]  SPO2 100%.  Pt has strong productive cough.  Pt able to state name clearly.  RT to monitor.

## 2018-01-08 ENCOUNTER — Inpatient Hospital Stay (HOSPITAL_COMMUNITY): Payer: Medicaid Other

## 2018-01-08 LAB — URINALYSIS, ROUTINE W REFLEX MICROSCOPIC
Bilirubin Urine: NEGATIVE
Glucose, UA: NEGATIVE mg/dL
Ketones, ur: NEGATIVE mg/dL
Leukocytes, UA: NEGATIVE
Nitrite: NEGATIVE
Protein, ur: NEGATIVE mg/dL
Specific Gravity, Urine: 1.017 (ref 1.005–1.030)
pH: 7 (ref 5.0–8.0)

## 2018-01-08 LAB — BASIC METABOLIC PANEL
ANION GAP: 5 (ref 5–15)
BUN: 11 mg/dL (ref 6–20)
CALCIUM: 7.3 mg/dL — AB (ref 8.9–10.3)
CO2: 27 mmol/L (ref 22–32)
Chloride: 106 mmol/L (ref 101–111)
Creatinine, Ser: 0.75 mg/dL (ref 0.61–1.24)
Glucose, Bld: 114 mg/dL — ABNORMAL HIGH (ref 65–99)
POTASSIUM: 3.7 mmol/L (ref 3.5–5.1)
SODIUM: 138 mmol/L (ref 135–145)

## 2018-01-08 LAB — CBC
HEMATOCRIT: 25.8 % — AB (ref 39.0–52.0)
HEMOGLOBIN: 8.4 g/dL — AB (ref 13.0–17.0)
MCH: 27.4 pg (ref 26.0–34.0)
MCHC: 32.6 g/dL (ref 30.0–36.0)
MCV: 84 fL (ref 78.0–100.0)
Platelets: 96 10*3/uL — ABNORMAL LOW (ref 150–400)
RBC: 3.07 MIL/uL — ABNORMAL LOW (ref 4.22–5.81)
RDW: 16.8 % — ABNORMAL HIGH (ref 11.5–15.5)
WBC: 6.3 10*3/uL (ref 4.0–10.5)

## 2018-01-08 LAB — CBC WITH DIFFERENTIAL/PLATELET
BASOS ABS: 0 10*3/uL (ref 0.0–0.1)
BASOS PCT: 1 %
EOS ABS: 0.1 10*3/uL (ref 0.0–0.7)
EOS PCT: 1 %
HCT: 18.5 % — ABNORMAL LOW (ref 39.0–52.0)
Hemoglobin: 5.9 g/dL — CL (ref 13.0–17.0)
LYMPHS PCT: 28 %
Lymphs Abs: 1.7 10*3/uL (ref 0.7–4.0)
MCH: 26.9 pg (ref 26.0–34.0)
MCHC: 31.9 g/dL (ref 30.0–36.0)
MCV: 84.5 fL (ref 78.0–100.0)
Monocytes Absolute: 0.7 10*3/uL (ref 0.1–1.0)
Monocytes Relative: 12 %
Neutro Abs: 3.5 10*3/uL (ref 1.7–7.7)
Neutrophils Relative %: 58 %
Platelets: 102 10*3/uL — ABNORMAL LOW (ref 150–400)
RBC: 2.19 MIL/uL — AB (ref 4.22–5.81)
RDW: 17.2 % — AB (ref 11.5–15.5)
WBC: 6 10*3/uL (ref 4.0–10.5)

## 2018-01-08 LAB — HEMOGLOBIN AND HEMATOCRIT, BLOOD
HEMATOCRIT: 25.1 % — AB (ref 39.0–52.0)
Hemoglobin: 8.1 g/dL — ABNORMAL LOW (ref 13.0–17.0)

## 2018-01-08 LAB — HEPARIN LEVEL (UNFRACTIONATED): Heparin Unfractionated: <0.1 IU/mL — ABNORMAL LOW (ref 0.30–0.70)

## 2018-01-08 LAB — MAGNESIUM: MAGNESIUM: 1 mg/dL — AB (ref 1.7–2.4)

## 2018-01-08 LAB — PREPARE RBC (CROSSMATCH)

## 2018-01-08 LAB — PHOSPHORUS: Phosphorus: 2.2 mg/dL — ABNORMAL LOW (ref 2.5–4.6)

## 2018-01-08 MED ORDER — SODIUM CHLORIDE 0.9 % IV BOLUS (SEPSIS)
1000.0000 mL | Freq: Once | INTRAVENOUS | Status: AC
  Administered 2018-01-08: 1000 mL via INTRAVENOUS

## 2018-01-08 MED ORDER — ACETAMINOPHEN 160 MG/5ML PO SOLN: 650.0000 mg | Freq: Four times a day (QID) | ORAL | Status: DC | PRN

## 2018-01-08 MED ORDER — ACETAMINOPHEN 160 MG/5ML PO SOLN
325.0000 mg | Freq: Once | ORAL | Status: AC
  Administered 2018-01-08: 325 mg
  Filled 2018-01-08: qty 20.3

## 2018-01-08 MED ORDER — SODIUM CHLORIDE 0.9 % IV SOLN
Freq: Once | INTRAVENOUS | Status: AC
  Administered 2018-01-08: 04:00:00 via INTRAVENOUS

## 2018-01-08 MED ORDER — HEPARIN (PORCINE) IN NACL 100-0.45 UNIT/ML-% IJ SOLN
1100.0000 [IU]/h | INTRAMUSCULAR | Status: AC
  Administered 2018-01-08: 950 [IU]/h via INTRAVENOUS
  Administered 2018-01-09: 1100 [IU]/h via INTRAVENOUS
  Filled 2018-01-08 (×2): qty 250

## 2018-01-08 NOTE — Progress Notes (Signed)
CRITICAL VALUE ALERT  Critical Value:  HGB 5.9  Date & Time Notied: 0340  Provider Notified: Bodenheimer  Orders Received/Actions taken: Order to transfuse 2 units of Blood

## 2018-01-08 NOTE — Progress Notes (Signed)
Referring Physician(s): Feng,Y  Supervising Physician: Marybelle Killings  Patient Status:  [facility] - In-pt  Chief Complaint: LLE DVT and caval thrombosis     Subjective: Pt with recent drop in hgb ; reported some blood via ostomy earlier, but none apparent now; denies CP,N/V or worsening abd pain; does have some left leg soreness but not as pronounced as prior to thrombectomy; notes sl less left leg edema; yellow urine in Foley   Allergies: Patient has no known allergies.  Medications: Prior to Admission medications   Medication Sig Start Date End Date Taking? Authorizing Provider  diphenoxylate-atropine (LOMOTIL) 2.5-0.025 MG tablet Take 1-2 tablets by mouth 4 (four) times daily as needed for diarrhea or loose stools. 12/13/17  Yes [name], MD  docusate sodium (COLACE) 100 MG capsule Take 100 mg by mouth 2 (two) times daily.   Yes [provider]  gabapentin (NEURONTIN) 100 MG capsule Take 1 capsule (100 mg total) 3 (three) times daily by mouth. 11/15/17  Yes [name], MD  Nutritional Supplements (NUTRITIONAL SUPPLEMENT PO) House Supplement - give 120 ml by mouth three times daily between meals for supplement   Yes [provider]  Oxycodone HCl 10 MG TABS Take 1 tablet (10 mg total) by mouth every 6 (six) hours as needed. 12/13/17  Yes [name], MD  Clindamycin Phos-Benzoyl Perox gel Apply 1 application topically 2 (two) times daily. Patient not taking: Reported on 12/30/2017 12/27/17   [name], MD  ferrous sulfate 325 (65 FE) MG tablet Take 1 tablet (325 mg total) by mouth 2 (two) times daily with a meal. Patient not taking: Reported on 12/27/2017 03/29/17   Maryanna Shape, NP  hydrocortisone 2.5 % cream Apply topically 2 (two) times daily. Patient not taking: Reported on 12/30/2017 10/18/17   Alla Feeling, NP  loperamide (IMODIUM A-D) 2 MG tablet Take 1 tablet (2 mg total) by mouth 4 (four) times daily as needed for diarrhea or loose stools. Patient not  taking: Reported on 12/30/2017 03/02/17   [name], MD  magnesium oxide (MAG-OX) 400 (241.3 Mg) MG tablet Take 1 tablet (400 mg total) by mouth 2 (two) times daily. Patient not taking: Reported on 12/30/2017 12/13/17   [name], MD  magnesium oxide (MAG-OX) 400 (241.3 Mg) MG tablet Take 1 tablet (400 mg total) by mouth daily. Patient not taking: Reported on 12/30/2017 12/27/17   [name], MD  mirtazapine (REMERON) 15 MG tablet Take 1 tablet (15 mg total) by mouth at bedtime. Patient not taking: Reported on 12/27/2017 06/22/17   Gardenia Phlegm, NP  ondansetron (ZOFRAN) 8 MG tablet Take 1 tablet (8 mg total) by mouth 2 (two) times daily as needed for refractory nausea / vomiting. Start on day 3 after chemotherapy. Patient not taking: Reported on 12/30/2017 05/25/17   [name], MD  potassium chloride SA (K-DUR,KLOR-CON) 20 MEQ tablet Take 1 tablet (20 mEq total) by mouth 2 (two) times daily. Patient not taking: Reported on 12/30/2017 12/27/17   [name], MD  prochlorperazine (COMPAZINE) 10 MG tablet Take 1 tablet (10 mg total) by mouth every 6 (six) hours as needed (Nausea or vomiting). Patient not taking: Reported on 12/30/2017 05/25/17   [name], MD     Vital Signs: BP 110/68 (BP Location: Right Arm)   Pulse (!) 117   Temp 99 F (37.2 C) (Oral)   Resp 18   Ht 5\' 9"  (1.753 m)   Wt 145 lb 4.5 oz (65.9 kg)  SpO2 100%   BMI 21.45 kg/m   Physical Exam  Skin: There is erythema.   awake/alert; rt neck/groin access sites ok, purse string sutures removed; abd soft,NT; LLE with 1-2+ edema; sens /motor fxn ok  Imaging: Ct Abdomen Pelvis Wo Contrast  Result Date: 01/08/2018 CLINICAL DATA:  Hematuria. Rule out intra-abdominal bleed. Metastatic colon cancer. DVT, recent thrombectomy. EXAM: CT ABDOMEN AND PELVIS WITHOUT CONTRAST TECHNIQUE: Multidetector CT imaging of the abdomen and pelvis was performed following the standard protocol without IV contrast. COMPARISON:  Procedure note of  01/06/2018. Contrast-enhanced CT of 12/30/2017. FINDINGS: Lower chest: Bibasilar atelectasis. Normal heart size with trace bilateral pleural effusion. Hepatobiliary: Grossly normal liver, gallbladder, biliary tract. Pancreas: Normal noncontrast appearance of the pancreas. Spleen: Soft tissue fullness within the splenic hilum, corresponding to the mass detailed on the prior contrast-enhanced exam. Grossly similar, including at 5.2 x 4.4 cm today. Adrenals/Urinary Tract: Normal adrenal glands. No renal calculi or hydronephrosis. Foley catheter within the urinary bladder. Stomach/Bowel: Normal stomach, without wall thickening. Double-barrel transverse colostomy. Normal caliber small bowel loops. Vascular/Lymphatic: Aortic and branch vessel atherosclerosis. Limited evaluation for abdominal adenopathy. No pelvic sidewall adenopathy. Left common and external iliac venous stent. Reproductive: Normal prostate. Other: No free intraperitoneal air. Diffuse anasarca. Mesenteric edema, without significant fluid. No evidence of intraperitoneal hemorrhage. Musculoskeletal: Right proximal femoral fixation. Remote right rib trauma. IMPRESSION: 1. Limited exam, without oral or IV contrast. 2. No evidence of abdominopelvic hemorrhage. 3. Small bilateral pleural effusions. 4. Grossly similar mass within the splenic hilum. 5.  Aortic Atherosclerosis (ICD10-I70.0). Electronically Signed   By: Abigail Miyamoto M.D.   On: 01/08/2018 13:12   Mr Jeri Cos WR Contrast  Result Date: 01/05/2018 CLINICAL DATA:  Metastatic colon cancer staging EXAM: MRI HEAD WITHOUT AND WITH CONTRAST TECHNIQUE: Multiplanar, multiecho pulse sequences of the brain and surrounding structures were obtained without and with intravenous contrast. CONTRAST:  49mL MULTIHANCE GADOBENATE DIMEGLUMINE 529 MG/ML IV SOLN COMPARISON:  CT head 11/30/2014 FINDINGS: Brain: Ventricle size and cerebral volume normal. Negative for infarct, hemorrhage, or mass. No edema. Normal  enhancement following contrast infusion. Vascular: Normal arterial flow voids Skull and upper cervical spine: Negative Sinuses/Orbits: Negative Other: None IMPRESSION: Negative MRI head with contrast.  Negative for metastatic disease. Electronically Signed   By: Franchot Gallo M.D.   On: 01/05/2018 11:40   Ir Veno/ext/bi  Result Date: 01/07/2018 INDICATION: 57 year-old male with metastatic colon cancer an acute severe iliocaval and left lower extremity DVT. He presents for thrombectomy. EXAM: 1. Ultrasound-guided vascular access left popliteal vein 2. Ultrasound-guided access right common femoral vein 3. Ultrasound-guided access right internal jugular vein 4. Second ultrasound-guided access right internal jugular vein 5. Left lower extremity venogram 6. Right lower extremity venogram 7. Inferior vena cavogram 8. Combined pharmacomechanical thrombolysis/thrombectomy using tPA, the AngioVac device, and the Angiojet device 9. IVC filter retrieval 10. Recanalization and balloon angioplasty of the right common iliac vein 11. Recanalization and angioplasty with subsequent stent placement in the left iliac vein 12. Balloon angioplasty of the left common femoral and femoral veins Operating Physicians: Corrie Mckusick, DO and Jacqulynn Cadet, MD COMPARISON:  CT scan 12/30/2017 MEDICATIONS: 12 mg tPA administered directly into thrombus throughout the course of the procedure ANESTHESIA/SEDATION: General anesthesia provided by the anesthesiology service FLUOROSCOPY TIME:  Fluoroscopy Time: 67 minutes 6 seconds (816.6 mGy). COMPLICATIONS: None immediate. TECHNIQUE: Informed written consent was obtained from the patient after a thorough discussion of the procedural risks, benefits and alternatives. All questions were addressed.  Maximal Sterile Barrier Technique was utilized including caps, mask, sterile gowns, sterile gloves, sterile drape, hand hygiene and skin antiseptic. A timeout was performed prior to the initiation of the  procedure. The patient was initially positioned prone. The left popliteal vein was interrogated with ultrasound and found to be widely patent. An image was obtained and stored for the medical record. Local anesthesia was attained by infiltration with 1% lidocaine. A small dermatotomy was made. Under real-time sonographic guidance, the vessel was punctured with a 21 gauge micropuncture needle. Using standard technique, the initial micro needle was exchanged over a 0.018 micro wire for a transitional 4 [place] micro sheath. The micro sheath was then exchanged over a 0.035 wire for a 45 cm 6 [place] braided Arrow sheath. A left lower extremity venogram was performed. The femoral vein is completely occluded in the thigh. The popliteal vein remains patent. 6 mg tPA was laced throughout the thrombus within the common and femoral veins. The patient was repositioned supine and intubated. The right common femoral was interrogated with ultrasound and found to be patent with changes of chronic DVT. An image was obtained and stored for the medical record. Local anesthesia was attained by infiltration with 1% lidocaine. A small dermatotomy was made. Under real-time sonographic guidance, the vessel was punctured with a 21 gauge micropuncture needle. Using standard technique, the initial micro needle was exchanged over a 0.018 micro wire for a transitional 4 [place] micro sheath. The micro sheath was then exchanged over a 0.035 wire for a 6 French vascular sheath. Right lower extremity venography was performed. The common femoral and external iliac veins are patent. 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 All venous drainage is via parallel spinous collaterals. An angled catheter and glidewire were introduced through the sheath. With some manipulation, the catheter was successfully advanced into the suprarenal IVC. An inferior vena cavagram was performed confirming patency of both renal veins and the super renal IVC. A  superstiff Amplatz wire was advanced into the right heart. A 10 x 40 mm Conquest balloon was introduced over the wire the occluded segment of the common iliac vein was angioplastied. Repeat venography demonstrates recreation of a small vascular channel. A 10 x 40 mm Conquest balloon was then selected and introduced over the wire. Venography demonstrates successful recanalization of the common iliac vein. There is flow through the IVC. 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 The 6 French sheath was exchanged in the soft tissue tract dilated to 24 [place]. A 26 French Gore dry seal sheath was then advanced over the wire and positioned in the right common iliac vein. The right internal jugular vein was interrogated with ultrasound and found to be widely patent. An image was obtained and stored for the medical record. Local anesthesia was attained by infiltration with 1% lidocaine. A small dermatotomy was made. Under real-time sonographic guidance, the vessel was punctured with a 21 gauge micropuncture needle. Using standard technique, the initial micro needle was exchanged over a 0.018 micro wire for a transitional 4 [place] micro sheath. The micro sheath was then exchanged over a 0.035 wire for a 6 French vascular sheath. A superstiff Amplatz wire was advanced into the suprarenal IVC. The skin tract was then serially dilated and an 53 French reperfusion catheter was advanced and position with the tip at the cavoatrial junction. At this time, the patient was fully heparinized until the ACT level was greater than 300 and the AngioVac circuit was  established. The angio [facility] device was advanced through the 17 French sheath from the right common femoral approach and suction thrombectomy was performed throughout the infrarenal inferior vena cava. Working from the popliteal access, additional left lower extremity venography images were performed focusing on the iliac and  common femoral vasculature. Thrombus is present within the common femoral vein and the external iliac vein. The left common iliac vein is completely occluded similar to the other side. Attempts were made to pass through the occluded left common iliac vein from below. This was unsuccessful. Therefore, a second right internal jugular venous access was required. The right internal jugular vein was was again interrogated with ultrasound and found to be widely patent. An image was obtained and stored for the medical record. Local anesthesia was attained by infiltration with 1% lidocaine. A small dermatotomy was made. Under real-time sonographic guidance, the vessel was punctured with a 21 gauge micropuncture needle. Using standard technique, the initial micro needle was exchanged over a 0.018 micro wire for a transitional 4 [place] micro sheath. The micro sheath was then exchanged over a 0.035 wire for a 6 French vascular sheath. An angled catheter and glidewire were advanced through the sheath, through the IVC filter, through the infrarenal IVC and the catheter successfully navigated across the chronically occluded left common iliac vein. The glidewire was then exchanged for an exchange length Glidewire and snared from the femoral vein in the midthigh and brought through the popliteal sheath access resulting in successful through and through access from the right internal jugular vein through the left popliteal vein. A catheter was then advanced over the wire into the femoral vein and the glidewire exchanged for a superstiff Amplatz wire. At this time, another cavagram was performed demonstrating successful debulking of the caval thrombus. At this point it was decided to proceed with removal of the IVC filter. Over the superstiff Amplatz wire from the right jugular vein approach, the skin tract was dilated and a second 26 [place] Gore dry seal sheath was advanced and positioned in the suprarenal IVC. A Bard IVC filter  retrieval set was then advanced coaxially through this device next to the Amplatz wire. The Bard [place] IVC filter was successfully retrieved using standard technique. Additional thrombectomy was then performed using the angio VAC device. This yielded significant material. Repeat vena cavagram demonstrates complete removal of thrombus from the inferior vena cava. Attention was next turned to the occluded left common iliac artery. The perfusion circuit was reversed and the reperfusion catheter was placed in the right femoral sheath and the angio back device was advanced through the right IJ sheath. Suction thrombectomy was performed in the right common iliac vein. Working from the popliteal access, angioplasty was performed of the common femoral, external iliac and common iliac veins and thrombus was pushed into the AngioVac. There appears to be a significant stenosis at the transition from the common iliac to the external iliac vein. This was resistant to initial angioplasty, so a 14 x 60 mm wall stent was selected, placed across the lesion and deployed. The wall stent was then post dilated to 12 mm using the 12 by 40 Conquest balloon. Catheter venography demonstrates significant near occlusive thrombus throughout the common femoral vein, and femoral vein in the thigh. Therefore, the decision was made to proceed with additional thrombectomy from the popliteal access using the [facility]. The 6 French left popliteal sheath was exchanged over the wire for a 9 French sheath. The is a long  take catheter was advanced over the wire and real lytic thrombectomy performed throughout the femoral, common femoral and iliac veins. Follow-up angiography demonstrates significant improvement with restoration of in-line flow. There is a significant residual stenosis at the common femoral vein. This was angioplastied to 12 mm using the 12 x 40 mm Conquest balloon. On the final angiographic runs, there is  successful restoration of in-line flow from the popliteal access through the IVC as well as successful recanalization of both iliac veins. All wires and devices were removed. Once the patient's ACT drop below 200, the sheaths were removed. Hemostasis was attained with a combination of manual pressure and pursestring sutures at both 26 [place] sheath sites. 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 Significant stenosis at the transition from the left common to the left external iliac vein 3. 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 Successful recanalization and angioplasty of the chronically occluded right common iliac vein 2. Successful thrombolysis/thrombectomy of the inferior vena cava, left-sided iliac veins and left lower extremity deep venous system to the level of the popliteal vein. 3. Stent assisted angioplasty of a flow limiting stenosis at the interface of the left common and external iliac veins. 4. Angioplasty of a stenotic segment of the left common femoral vein. 5. The IVC filter was retrieved.  No additional filter was placed. PLAN: 1. Continue full-dose heparin and transitioned to weight based Lovenox prior to discharge. 2. Progress toward ambulation as soon as clinically possible. 3. Compression garment or Ace wraps to the left lower extremity if needed to facilitate edema reduction. 4. Follow-up with Dr. Laurence Ferrari in interventional radiology clinic in 2 weeks. 5. If patient demonstrates any evidence of bleeding and needs to come off of his anticoagulation in the future, he must have an IVC filter replaced. Signed, Criselda Peaches, MD [facility] Electronically Signed   By: Jacqulynn Cadet M.D.   On: 01/07/2018 10:46   Ir Mariana Arn Ivc  Result Date: 01/07/2018 INDICATION: 57 year-old male with metastatic colon cancer an acute severe iliocaval and left lower extremity DVT. He presents  for thrombectomy. EXAM: 1. Ultrasound-guided vascular access left popliteal vein 2. Ultrasound-guided access right common femoral vein 3. Ultrasound-guided access right internal jugular vein 4. Second ultrasound-guided access right internal jugular vein 5. Left lower extremity venogram 6. Right lower extremity venogram 7. Inferior vena cavogram 8. Combined pharmacomechanical thrombolysis/thrombectomy using tPA, the AngioVac device, and the Angiojet device 9. IVC filter retrieval 10. Recanalization and balloon angioplasty of the right common iliac vein 11. Recanalization and angioplasty with subsequent stent placement in the left iliac vein 12. Balloon angioplasty of the left common femoral and femoral veins Operating Physicians: Corrie Mckusick, DO and Jacqulynn Cadet, MD COMPARISON:  CT scan 12/30/2017 MEDICATIONS: 12 mg tPA administered directly into thrombus throughout the course of the procedure ANESTHESIA/SEDATION: General anesthesia provided by the anesthesiology service FLUOROSCOPY TIME:  Fluoroscopy Time: 67 minutes 6 seconds (816.6 mGy). COMPLICATIONS: None immediate. TECHNIQUE: Informed written consent was obtained from the patient after a thorough discussion of the procedural risks, benefits and alternatives. All questions were addressed. Maximal Sterile Barrier Technique was utilized including caps, mask, sterile gowns, sterile gloves, sterile drape, hand hygiene and skin antiseptic. A timeout was performed prior to the initiation of the procedure. The patient was initially positioned prone. The left popliteal vein was interrogated with ultrasound and found to be widely patent. An image was obtained and stored for  the medical record. Local anesthesia was attained by infiltration with 1% lidocaine. A small dermatotomy was made. Under real-time sonographic guidance, the vessel was punctured with a 21 gauge micropuncture needle. Using standard technique, the initial micro needle was exchanged over a 0.018  micro wire for a transitional 4 [place] micro sheath. The micro sheath was then exchanged over a 0.035 wire for a 45 cm 6 [place] braided Arrow sheath. A left lower extremity venogram was performed. The femoral vein is completely occluded in the thigh. The popliteal vein remains patent. 6 mg tPA was laced throughout the thrombus within the common and femoral veins. The patient was repositioned supine and intubated. The right common femoral was interrogated with ultrasound and found to be patent with changes of chronic DVT. An image was obtained and stored for the medical record. Local anesthesia was attained by infiltration with 1% lidocaine. A small dermatotomy was made. Under real-time sonographic guidance, the vessel was punctured with a 21 gauge micropuncture needle. Using standard technique, the initial micro needle was exchanged over a 0.018 micro wire for a transitional 4 [place] micro sheath. The micro sheath was then exchanged over a 0.035 wire for a 6 French vascular sheath. Right lower extremity venography was performed. The common femoral and external iliac veins are patent. 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 All venous drainage is via parallel spinous collaterals. An angled catheter and glidewire were introduced through the sheath. With some manipulation, the catheter was successfully advanced into the suprarenal IVC. An inferior vena cavagram was performed confirming patency of both renal veins and the super renal IVC. A superstiff Amplatz wire was advanced into the right heart. A 10 x 40 mm Conquest balloon was introduced over the wire the occluded segment of the common iliac vein was angioplastied. Repeat venography demonstrates recreation of a small vascular channel. A 10 x 40 mm Conquest balloon was then selected and introduced over the wire. Venography demonstrates successful recanalization of the common iliac vein. There is flow through the IVC. Extensive filling defects within the vena  cava filter consistent with known caval thrombosis. The 6 French sheath was exchanged in the soft tissue tract dilated to 24 [place]. A 26 French Gore dry seal sheath was then advanced over the wire and positioned in the right common iliac vein. The right internal jugular vein was interrogated with ultrasound and found to be widely patent. An image was obtained and stored for the medical record. Local anesthesia was attained by infiltration with 1% lidocaine. A small dermatotomy was made. Under real-time sonographic guidance, the vessel was punctured with a 21 gauge micropuncture needle. Using standard technique, the initial micro needle was exchanged over a 0.018 micro wire for a transitional 4 [place] micro sheath. The micro sheath was then exchanged over a 0.035 wire for a 6 French vascular sheath. A superstiff Amplatz wire was advanced into the suprarenal IVC. The skin tract was then serially dilated and an 79 French reperfusion catheter was advanced and position with the tip at the cavoatrial junction. At this time, the patient was fully heparinized until the ACT level was greater than 300 and the AngioVac circuit was established. The angio [facility] device was advanced through the 36 French sheath from the right common femoral approach and suction thrombectomy was performed throughout the infrarenal inferior vena cava. Working from the popliteal access, additional left lower extremity venography images were performed focusing on the iliac and common femoral vasculature. Thrombus is present within the common femoral vein  and the external iliac vein. The left common iliac vein is completely occluded similar to the other side. Attempts were made to pass through the occluded left common iliac vein from below. This was unsuccessful. Therefore, a second right internal jugular venous access was required. The right internal jugular vein was was again interrogated with ultrasound and found to be widely patent. An image was  obtained and stored for the medical record. Local anesthesia was attained by infiltration with 1% lidocaine. A small dermatotomy was made. Under real-time sonographic guidance, the vessel was punctured with a 21 gauge micropuncture needle. Using standard technique, the initial micro needle was exchanged over a 0.018 micro wire for a transitional 4 [place] micro sheath. The micro sheath was then exchanged over a 0.035 wire for a 6 French vascular sheath. An angled catheter and glidewire were advanced through the sheath, through the IVC filter, through the infrarenal IVC and the catheter successfully navigated across the chronically occluded left common iliac vein. The glidewire was then exchanged for an exchange length Glidewire and snared from the femoral vein in the midthigh and brought through the popliteal sheath access resulting in successful through and through access from the right internal jugular vein through the left popliteal vein. A catheter was then advanced over the wire into the femoral vein and the glidewire exchanged for a superstiff Amplatz wire. At this time, another cavagram was performed demonstrating successful debulking of the caval thrombus. At this point it was decided to proceed with removal of the IVC filter. Over the superstiff Amplatz wire from the right jugular vein approach, the skin tract was dilated and a second 26 [place] Gore dry seal sheath was advanced and positioned in the suprarenal IVC. A Bard IVC filter retrieval set was then advanced coaxially through this device next to the Amplatz wire. The Bard [place] IVC filter was successfully retrieved using standard technique. Additional thrombectomy was then performed using the angio VAC device. This yielded significant material. Repeat vena cavagram demonstrates complete removal of thrombus from the inferior vena cava. Attention was next turned to the occluded left common iliac artery. The perfusion circuit was reversed and the  reperfusion catheter was placed in the right femoral sheath and the angio back device was advanced through the right IJ sheath. Suction thrombectomy was performed in the right common iliac vein. Working from the popliteal access, angioplasty was performed of the common femoral, external iliac and common iliac veins and thrombus was pushed into the AngioVac. There appears to be a significant stenosis at the transition from the common iliac to the external iliac vein. This was resistant to initial angioplasty, so a 14 x 60 mm wall stent was selected, placed across the lesion and deployed. The wall stent was then post dilated to 12 mm using the 12 by 40 Conquest balloon. Catheter venography demonstrates significant near occlusive thrombus throughout the common femoral vein, and femoral vein in the thigh. Therefore, the decision was made to proceed with additional thrombectomy from the popliteal access using the [facility]. The 6 French left popliteal sheath was exchanged over the wire for a 9 French sheath. The is a long take catheter was advanced over the wire and real lytic thrombectomy performed throughout the femoral, common femoral and iliac veins. Follow-up angiography demonstrates significant improvement with restoration of in-line flow. There is a significant residual stenosis at the common femoral vein. This was angioplastied to 12 mm using the 12 x 40 mm Conquest balloon. On the final  angiographic runs, there is successful restoration of in-line flow from the popliteal access through the IVC as well as successful recanalization of both iliac veins. All wires and devices were removed. Once the patient's ACT drop below 200, the sheaths were removed. Hemostasis was attained with a combination of manual pressure and pursestring sutures at both 26 [place] sheath sites. 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 Significant stenosis at the transition from the left common  to the left external iliac vein 3. 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 Successful recanalization and angioplasty of the chronically occluded right common iliac vein 2. Successful thrombolysis/thrombectomy of the inferior vena cava, left-sided iliac veins and left lower extremity deep venous system to the level of the popliteal vein. 3. Stent assisted angioplasty of a flow limiting stenosis at the interface of the left common and external iliac veins. 4. Angioplasty of a stenotic segment of the left common femoral vein. 5. The IVC filter was retrieved.  No additional filter was placed. PLAN: 1. Continue full-dose heparin and transitioned to weight based Lovenox prior to discharge. 2. Progress toward ambulation as soon as clinically possible. 3. Compression garment or Ace wraps to the left lower extremity if needed to facilitate edema reduction. 4. Follow-up with Dr. Laurence Ferrari in interventional radiology clinic in 2 weeks. 5. If patient demonstrates any evidence of bleeding and needs to come off of his anticoagulation in the future, he must have an IVC filter replaced. Signed, Criselda Peaches, MD [facility] Radiology Electronically Signed   By: Jacqulynn Cadet M.D.   On: 01/07/2018 10:46   Ir Thrombect Veno Mech Mod Sed  Result Date: 01/07/2018 INDICATION: 57 year-old male with metastatic colon cancer an acute severe iliocaval and left lower extremity DVT. He presents for thrombectomy. EXAM: 1. Ultrasound-guided vascular access left popliteal vein 2. Ultrasound-guided access right common femoral vein 3. Ultrasound-guided access right internal jugular vein 4. Second ultrasound-guided access right internal jugular vein 5. Left lower extremity venogram 6. Right lower extremity venogram 7. Inferior vena cavogram 8. Combined pharmacomechanical thrombolysis/thrombectomy using tPA, the AngioVac device, and the Angiojet device 9. IVC filter  retrieval 10. Recanalization and balloon angioplasty of the right common iliac vein 11. Recanalization and angioplasty with subsequent stent placement in the left iliac vein 12. Balloon angioplasty of the left common femoral and femoral veins Operating Physicians: Corrie Mckusick, DO and Jacqulynn Cadet, MD COMPARISON:  CT scan 12/30/2017 MEDICATIONS: 12 mg tPA administered directly into thrombus throughout the course of the procedure ANESTHESIA/SEDATION: General anesthesia provided by the anesthesiology service FLUOROSCOPY TIME:  Fluoroscopy Time: 67 minutes 6 seconds (816.6 mGy). COMPLICATIONS: None immediate. TECHNIQUE: Informed written consent was obtained from the patient after a thorough discussion of the procedural risks, benefits and alternatives. All questions were addressed. Maximal Sterile Barrier Technique was utilized including caps, mask, sterile gowns, sterile gloves, sterile drape, hand hygiene and skin antiseptic. A timeout was performed prior to the initiation of the procedure. The patient was initially positioned prone. The left popliteal vein was interrogated with ultrasound and found to be widely patent. An image was obtained and stored for the medical record. Local anesthesia was attained by infiltration with 1% lidocaine. A small dermatotomy was made. Under real-time sonographic guidance, the vessel was punctured with a 21 gauge micropuncture needle. Using standard technique, the initial micro needle was exchanged over a 0.018 micro wire for a transitional 4 [place] micro sheath. The micro sheath  was then exchanged over a 0.035 wire for a 45 cm 6 [place] braided Arrow sheath. A left lower extremity venogram was performed. The femoral vein is completely occluded in the thigh. The popliteal vein remains patent. 6 mg tPA was laced throughout the thrombus within the common and femoral veins. The patient was repositioned supine and intubated. The right common femoral was interrogated with ultrasound  and found to be patent with changes of chronic DVT. An image was obtained and stored for the medical record. Local anesthesia was attained by infiltration with 1% lidocaine. A small dermatotomy was made. Under real-time sonographic guidance, the vessel was punctured with a 21 gauge micropuncture needle. Using standard technique, the initial micro needle was exchanged over a 0.018 micro wire for a transitional 4 [place] micro sheath. The micro sheath was then exchanged over a 0.035 wire for a 6 French vascular sheath. Right lower extremity venography was performed. The common femoral and external iliac veins are patent. 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 All venous drainage is via parallel spinous collaterals. An angled catheter and glidewire were introduced through the sheath. With some manipulation, the catheter was successfully advanced into the suprarenal IVC. An inferior vena cavagram was performed confirming patency of both renal veins and the super renal IVC. A superstiff Amplatz wire was advanced into the right heart. A 10 x 40 mm Conquest balloon was introduced over the wire the occluded segment of the common iliac vein was angioplastied. Repeat venography demonstrates recreation of a small vascular channel. A 10 x 40 mm Conquest balloon was then selected and introduced over the wire. Venography demonstrates successful recanalization of the common iliac vein. There is flow through the IVC. 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 The 6 French sheath was exchanged in the soft tissue tract dilated to 24 [place]. A 26 [place] Gore dry seal sheath was then advanced over the wire and positioned in the right common iliac vein. The right internal jugular vein was interrogated with ultrasound and found to be widely patent. An image was obtained and stored for the medical record. Local anesthesia was attained by infiltration with 1% lidocaine. A small  dermatotomy was made. Under real-time sonographic guidance, the vessel was punctured with a 21 gauge micropuncture needle. Using standard technique, the initial micro needle was exchanged over a 0.018 micro wire for a transitional 4 [place] micro sheath. The micro sheath was then exchanged over a 0.035 wire for a 6 French vascular sheath. A superstiff Amplatz wire was advanced into the suprarenal IVC. The skin tract was then serially dilated and an 39 French reperfusion catheter was advanced and position with the tip at the cavoatrial junction. At this time, the patient was fully heparinized until the ACT level was greater than 300 and the AngioVac circuit was established. The angio [facility] device was advanced through the 53 French sheath from the right common femoral approach and suction thrombectomy was performed throughout the infrarenal inferior vena cava. Working from the popliteal access, additional left lower extremity venography images were performed focusing on the iliac and common femoral vasculature. Thrombus is present within the common femoral vein and the external iliac vein. The left common iliac vein is completely occluded similar to the other side. Attempts were made to pass through the occluded left common iliac vein from below. This was unsuccessful. Therefore, a second right internal jugular venous access was required. The right internal jugular vein was was again interrogated with  ultrasound and found to be widely patent. An image was obtained and stored for the medical record. Local anesthesia was attained by infiltration with 1% lidocaine. A small dermatotomy was made. Under real-time sonographic guidance, the vessel was punctured with a 21 gauge micropuncture needle. Using standard technique, the initial micro needle was exchanged over a 0.018 micro wire for a transitional 4 [place] micro sheath. The micro sheath was then exchanged over a 0.035 wire for a 6 French vascular sheath. An angled  catheter and glidewire were advanced through the sheath, through the IVC filter, through the infrarenal IVC and the catheter successfully navigated across the chronically occluded left common iliac vein. The glidewire was then exchanged for an exchange length Glidewire and snared from the femoral vein in the midthigh and brought through the popliteal sheath access resulting in successful through and through access from the right internal jugular vein through the left popliteal vein. A catheter was then advanced over the wire into the femoral vein and the glidewire exchanged for a superstiff Amplatz wire. At this time, another cavagram was performed demonstrating successful debulking of the caval thrombus. At this point it was decided to proceed with removal of the IVC filter. Over the superstiff Amplatz wire from the right jugular vein approach, the skin tract was dilated and a second 26 [place] Gore dry seal sheath was advanced and positioned in the suprarenal IVC. A Bard IVC filter retrieval set was then advanced coaxially through this device next to the Amplatz wire. The Bard [place] IVC filter was successfully retrieved using standard technique. Additional thrombectomy was then performed using the angio VAC device. This yielded significant material. Repeat vena cavagram demonstrates complete removal of thrombus from the inferior vena cava. Attention was next turned to the occluded left common iliac artery. The perfusion circuit was reversed and the reperfusion catheter was placed in the right femoral sheath and the angio back device was advanced through the right IJ sheath. Suction thrombectomy was performed in the right common iliac vein. Working from the popliteal access, angioplasty was performed of the common femoral, external iliac and common iliac veins and thrombus was pushed into the AngioVac. There appears to be a significant stenosis at the transition from the common iliac to the external iliac vein.  This was resistant to initial angioplasty, so a 14 x 60 mm wall stent was selected, placed across the lesion and deployed. The wall stent was then post dilated to 12 mm using the 12 by 40 Conquest balloon. Catheter venography demonstrates significant near occlusive thrombus throughout the common femoral vein, and femoral vein in the thigh. Therefore, the decision was made to proceed with additional thrombectomy from the popliteal access using the [facility]. The 6 French left popliteal sheath was exchanged over the wire for a 9 French sheath. The is a long take catheter was advanced over the wire and real lytic thrombectomy performed throughout the femoral, common femoral and iliac veins. Follow-up angiography demonstrates significant improvement with restoration of in-line flow. There is a significant residual stenosis at the common femoral vein. This was angioplastied to 12 mm using the 12 x 40 mm Conquest balloon. On the final angiographic runs, there is successful restoration of in-line flow from the popliteal access through the IVC as well as successful recanalization of both iliac veins. All wires and devices were removed. Once the patient's ACT drop below 200, the sheaths were removed. Hemostasis was attained with a combination of manual pressure and pursestring sutures at  both 26 [place] sheath sites. 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 Significant stenosis at the transition from the left common to the left external iliac vein 3. 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 Successful recanalization and angioplasty of the chronically occluded right common iliac vein 2. Successful thrombolysis/thrombectomy of the inferior vena cava, left-sided iliac veins and left lower extremity deep venous system to the level of the popliteal vein. 3. Stent assisted angioplasty of a flow limiting stenosis at the interface of the left common and external  iliac veins. 4. Angioplasty of a stenotic segment of the left common femoral vein. 5. The IVC filter was retrieved.  No additional filter was placed. PLAN: 1. Continue full-dose heparin and transitioned to weight based Lovenox prior to discharge. 2. Progress toward ambulation as soon as clinically possible. 3. Compression garment or Ace wraps to the left lower extremity if needed to facilitate edema reduction. 4. Follow-up with Dr. Laurence Ferrari in interventional radiology clinic in 2 weeks. 5. If patient demonstrates any evidence of bleeding and needs to come off of his anticoagulation in the future, he must have an IVC filter replaced. Signed, Criselda Peaches, MD [facility] Radiology Electronically Signed   By: Jacqulynn Cadet M.D.   On: 01/07/2018 10:46   Ir US Guide Vasc Access Left  Result Date: 01/07/2018 INDICATION: 57 year-old male with metastatic colon cancer an acute severe iliocaval and left lower extremity DVT. He presents for thrombectomy. EXAM: 1. Ultrasound-guided vascular access left popliteal vein 2. Ultrasound-guided access right common femoral vein 3. Ultrasound-guided access right internal jugular vein 4. Second ultrasound-guided access right internal jugular vein 5. Left lower extremity venogram 6. Right lower extremity venogram 7. Inferior vena cavogram 8. Combined pharmacomechanical thrombolysis/thrombectomy using tPA, the AngioVac device, and the Angiojet device 9. IVC filter retrieval 10. Recanalization and balloon angioplasty of the right common iliac vein 11. Recanalization and angioplasty with subsequent stent placement in the left iliac vein 12. Balloon angioplasty of the left common femoral and femoral veins Operating Physicians: Corrie Mckusick, DO and Jacqulynn Cadet, MD COMPARISON:  CT scan 12/30/2017 MEDICATIONS: 12 mg tPA administered directly into thrombus throughout the course of the procedure ANESTHESIA/SEDATION: General  anesthesia provided by the anesthesiology service FLUOROSCOPY TIME:  Fluoroscopy Time: 67 minutes 6 seconds (816.6 mGy). COMPLICATIONS: None immediate. TECHNIQUE: Informed written consent was obtained from the patient after a thorough discussion of the procedural risks, benefits and alternatives. All questions were addressed. Maximal Sterile Barrier Technique was utilized including caps, mask, sterile gowns, sterile gloves, sterile drape, hand hygiene and skin antiseptic. A timeout was performed prior to the initiation of the procedure. The patient was initially positioned prone. The left popliteal vein was interrogated with ultrasound and found to be widely patent. An image was obtained and stored for the medical record. Local anesthesia was attained by infiltration with 1% lidocaine. A small dermatotomy was made. Under real-time sonographic guidance, the vessel was punctured with a 21 gauge micropuncture needle. Using standard technique, the initial micro needle was exchanged over a 0.018 micro wire for a transitional 4 [place] micro sheath. The micro sheath was then exchanged over a 0.035 wire for a 45 cm 6 [place] braided Arrow sheath. A left lower extremity venogram was performed. The femoral vein is completely occluded in the thigh. The popliteal vein remains patent. 6 mg tPA was laced throughout the thrombus within the common and femoral veins. The patient was repositioned  supine and intubated. The right common femoral was interrogated with ultrasound and found to be patent with changes of chronic DVT. An image was obtained and stored for the medical record. Local anesthesia was attained by infiltration with 1% lidocaine. A small dermatotomy was made. Under real-time sonographic guidance, the vessel was punctured with a 21 gauge micropuncture needle. Using standard technique, the initial micro needle was exchanged over a 0.018 micro wire for a transitional 4 [place] micro sheath. The micro sheath was then  exchanged over a 0.035 wire for a 6 French vascular sheath. Right lower extremity venography was performed. The common femoral and external iliac veins are patent. 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 All venous drainage is via parallel spinous collaterals. An angled catheter and glidewire were introduced through the sheath. With some manipulation, the catheter was successfully advanced into the suprarenal IVC. An inferior vena cavagram was performed confirming patency of both renal veins and the super renal IVC. A superstiff Amplatz wire was advanced into the right heart. A 10 x 40 mm Conquest balloon was introduced over the wire the occluded segment of the common iliac vein was angioplastied. Repeat venography demonstrates recreation of a small vascular channel. A 10 x 40 mm Conquest balloon was then selected and introduced over the wire. Venography demonstrates successful recanalization of the common iliac vein. There is flow through the IVC. 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 The 6 French sheath was exchanged in the soft tissue tract dilated to 24 [place]. A 26 [place] Gore dry seal sheath was then advanced over the wire and positioned in the right common iliac vein. The right internal jugular vein was interrogated with ultrasound and found to be widely patent. An image was obtained and stored for the medical record. Local anesthesia was attained by infiltration with 1% lidocaine. A small dermatotomy was made. Under real-time sonographic guidance, the vessel was punctured with a 21 gauge micropuncture needle. Using standard technique, the initial micro needle was exchanged over a 0.018 micro wire for a transitional 4 [place] micro sheath. The micro sheath was then exchanged over a 0.035 wire for a 6 French vascular sheath. A superstiff Amplatz wire was advanced into the suprarenal IVC. The skin tract was then serially dilated and an 77 French  reperfusion catheter was advanced and position with the tip at the cavoatrial junction. At this time, the patient was fully heparinized until the ACT level was greater than 300 and the AngioVac circuit was established. The angio [facility] device was advanced through the 44 [place] sheath from the right common femoral approach and suction thrombectomy was performed throughout the infrarenal inferior vena cava. Working from the popliteal access, additional left lower extremity venography images were performed focusing on the iliac and common femoral vasculature. Thrombus is present within the common femoral vein and the external iliac vein. The left common iliac vein is completely occluded similar to the other side. Attempts were made to pass through the occluded left common iliac vein from below. This was unsuccessful. Therefore, a second right internal jugular venous access was required. The right internal jugular vein was was again interrogated with ultrasound and found to be widely patent. An image was obtained and stored for the medical record. Local anesthesia was attained by infiltration with 1% lidocaine. A small dermatotomy was made. Under real-time sonographic guidance, the vessel was punctured with a 21 gauge micropuncture needle. Using standard technique, the initial micro needle was exchanged over  a 0.018 micro wire for a transitional 4 [place] micro sheath. The micro sheath was then exchanged over a 0.035 wire for a 6 French vascular sheath. An angled catheter and glidewire were advanced through the sheath, through the IVC filter, through the infrarenal IVC and the catheter successfully navigated across the chronically occluded left common iliac vein. The glidewire was then exchanged for an exchange length Glidewire and snared from the femoral vein in the midthigh and brought through the popliteal sheath access resulting in successful through and through access from the right internal jugular vein through the  left popliteal vein. A catheter was then advanced over the wire into the femoral vein and the glidewire exchanged for a superstiff Amplatz wire. At this time, another cavagram was performed demonstrating successful debulking of the caval thrombus. At this point it was decided to proceed with removal of the IVC filter. Over the superstiff Amplatz wire from the right jugular vein approach, the skin tract was dilated and a second 26 [place] Gore dry seal sheath was advanced and positioned in the suprarenal IVC. A Bard IVC filter retrieval set was then advanced coaxially through this device next to the Amplatz wire. The Bard [place] IVC filter was successfully retrieved using standard technique. Additional thrombectomy was then performed using the angio VAC device. This yielded significant material. Repeat vena cavagram demonstrates complete removal of thrombus from the inferior vena cava. Attention was next turned to the occluded left common iliac artery. The perfusion circuit was reversed and the reperfusion catheter was placed in the right femoral sheath and the angio back device was advanced through the right IJ sheath. Suction thrombectomy was performed in the right common iliac vein. Working from the popliteal access, angioplasty was performed of the common femoral, external iliac and common iliac veins and thrombus was pushed into the AngioVac. There appears to be a significant stenosis at the transition from the common iliac to the external iliac vein. This was resistant to initial angioplasty, so a 14 x 60 mm wall stent was selected, placed across the lesion and deployed. The wall stent was then post dilated to 12 mm using the 12 by 40 Conquest balloon. Catheter venography demonstrates significant near occlusive thrombus throughout the common femoral vein, and femoral vein in the thigh. Therefore, the decision was made to proceed with additional thrombectomy from the popliteal access using the [facility]. The 6 French left popliteal sheath was exchanged over the wire for a 9 French sheath. The is a long take catheter was advanced over the wire and real lytic thrombectomy performed throughout the femoral, common femoral and iliac veins. Follow-up angiography demonstrates significant improvement with restoration of in-line flow. There is a significant residual stenosis at the common femoral vein. This was angioplastied to 12 mm using the 12 x 40 mm Conquest balloon. On the final angiographic runs, there is successful restoration of in-line flow from the popliteal access through the IVC as well as successful recanalization of both iliac veins. All wires and devices were removed. Once the patient's ACT drop below 200, the sheaths were removed. Hemostasis was attained with a combination of manual pressure and pursestring sutures at both 26 [place] sheath sites. 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 Significant stenosis at the transition from the left common to the left external iliac vein 3. 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 Successful recanalization and angioplasty of the chronically occluded right common iliac vein 2.  Successful thrombolysis/thrombectomy of the inferior vena cava, left-sided iliac veins and left lower extremity deep venous system to the level of the popliteal vein. 3. Stent assisted angioplasty of a flow limiting stenosis at the interface of the left common and external iliac veins. 4. Angioplasty of a stenotic segment of the left common femoral vein. 5. The IVC filter was retrieved.  No additional filter was placed. PLAN: 1. Continue full-dose heparin and transitioned to weight based Lovenox prior to discharge. 2. Progress toward ambulation as soon as clinically possible. 3. Compression garment or Ace wraps to the left lower extremity if needed to facilitate edema reduction. 4. Follow-up with Dr. Laurence Ferrari in interventional  radiology clinic in 2 weeks. 5. If patient demonstrates any evidence of bleeding and needs to come off of his anticoagulation in the future, he must have an IVC filter replaced. Signed, Criselda Peaches, MD [facility] Electronically Signed   By: Jacqulynn Cadet M.D.   On: 01/07/2018 10:46   Ir US Guide Vasc Access Right  Result Date: 01/07/2018 INDICATION: 57 year-old male with metastatic colon cancer an acute severe iliocaval and left lower extremity DVT. He presents for thrombectomy. EXAM: 1. Ultrasound-guided vascular access left popliteal vein 2. Ultrasound-guided access right common femoral vein 3. Ultrasound-guided access right internal jugular vein 4. Second ultrasound-guided access right internal jugular vein 5. Left lower extremity venogram 6. Right lower extremity venogram 7. Inferior vena cavogram 8. Combined pharmacomechanical thrombolysis/thrombectomy using tPA, the AngioVac device, and the Angiojet device 9. IVC filter retrieval 10. Recanalization and balloon angioplasty of the right common iliac vein 11. Recanalization and angioplasty with subsequent stent placement in the left iliac vein 12. Balloon angioplasty of the left common femoral and femoral veins Operating Physicians: Corrie Mckusick, DO and Jacqulynn Cadet, MD COMPARISON:  CT scan 12/30/2017 MEDICATIONS: 12 mg tPA administered directly into thrombus throughout the course of the procedure ANESTHESIA/SEDATION: General anesthesia provided by the anesthesiology service FLUOROSCOPY TIME:  Fluoroscopy Time: 67 minutes 6 seconds (816.6 mGy). COMPLICATIONS: None immediate. TECHNIQUE: Informed written consent was obtained from the patient after a thorough discussion of the procedural risks, benefits and alternatives. All questions were addressed. Maximal Sterile Barrier Technique was utilized including caps, mask, sterile gowns, sterile gloves, sterile drape, hand hygiene and skin  antiseptic. A timeout was performed prior to the initiation of the procedure. The patient was initially positioned prone. The left popliteal vein was interrogated with ultrasound and found to be widely patent. An image was obtained and stored for the medical record. Local anesthesia was attained by infiltration with 1% lidocaine. A small dermatotomy was made. Under real-time sonographic guidance, the vessel was punctured with a 21 gauge micropuncture needle. Using standard technique, the initial micro needle was exchanged over a 0.018 micro wire for a transitional 4 [place] micro sheath. The micro sheath was then exchanged over a 0.035 wire for a 45 cm 6 [place] braided Arrow sheath. A left lower extremity venogram was performed. The femoral vein is completely occluded in the thigh. The popliteal vein remains patent. 6 mg tPA was laced throughout the thrombus within the common and femoral veins. The patient was repositioned supine and intubated. The right common femoral was interrogated with ultrasound and found to be patent with changes of chronic DVT. An image was obtained and stored for the medical record. Local anesthesia was attained by infiltration with 1% lidocaine. A small dermatotomy was made. Under real-time sonographic guidance, the vessel was punctured with a  21 gauge micropuncture needle. Using standard technique, the initial micro needle was exchanged over a 0.018 micro wire for a transitional 4 [place] micro sheath. The micro sheath was then exchanged over a 0.035 wire for a 6 French vascular sheath. Right lower extremity venography was performed. The common femoral and external iliac veins are patent. 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 All venous drainage is via parallel spinous collaterals. An angled catheter and glidewire were introduced through the sheath. With some manipulation, the catheter was successfully advanced into the suprarenal IVC. An inferior vena cavagram was performed  confirming patency of both renal veins and the super renal IVC. A superstiff Amplatz wire was advanced into the right heart. A 10 x 40 mm Conquest balloon was introduced over the wire the occluded segment of the common iliac vein was angioplastied. Repeat venography demonstrates recreation of a small vascular channel. A 10 x 40 mm Conquest balloon was then selected and introduced over the wire. Venography demonstrates successful recanalization of the common iliac vein. There is flow through the IVC. 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 The 6 French sheath was exchanged in the soft tissue tract dilated to 24 [place]. A 26 French Gore dry seal sheath was then advanced over the wire and positioned in the right common iliac vein. The right internal jugular vein was interrogated with ultrasound and found to be widely patent. An image was obtained and stored for the medical record. Local anesthesia was attained by infiltration with 1% lidocaine. A small dermatotomy was made. Under real-time sonographic guidance, the vessel was punctured with a 21 gauge micropuncture needle. Using standard technique, the initial micro needle was exchanged over a 0.018 micro wire for a transitional 4 [place] micro sheath. The micro sheath was then exchanged over a 0.035 wire for a 6 French vascular sheath. A superstiff Amplatz wire was advanced into the suprarenal IVC. The skin tract was then serially dilated and an 21 French reperfusion catheter was advanced and position with the tip at the cavoatrial junction. At this time, the patient was fully heparinized until the ACT level was greater than 300 and the AngioVac circuit was established. The angio [facility] device was advanced through the 73 French sheath from the right common femoral approach and suction thrombectomy was performed throughout the infrarenal inferior vena cava. Working from the popliteal access, additional left lower  extremity venography images were performed focusing on the iliac and common femoral vasculature. Thrombus is present within the common femoral vein and the external iliac vein. The left common iliac vein is completely occluded similar to the other side. Attempts were made to pass through the occluded left common iliac vein from below. This was unsuccessful. Therefore, a second right internal jugular venous access was required. The right internal jugular vein was was again interrogated with ultrasound and found to be widely patent. An image was obtained and stored for the medical record. Local anesthesia was attained by infiltration with 1% lidocaine. A small dermatotomy was made. Under real-time sonographic guidance, the vessel was punctured with a 21 gauge micropuncture needle. Using standard technique, the initial micro needle was exchanged over a 0.018 micro wire for a transitional 4 [place] micro sheath. The micro sheath was then exchanged over a 0.035 wire for a 6 French vascular sheath. An angled catheter and glidewire were advanced through the sheath, through the IVC filter, through the infrarenal IVC and the catheter successfully navigated across the chronically occluded left  common iliac vein. The glidewire was then exchanged for an exchange length Glidewire and snared from the femoral vein in the midthigh and brought through the popliteal sheath access resulting in successful through and through access from the right internal jugular vein through the left popliteal vein. A catheter was then advanced over the wire into the femoral vein and the glidewire exchanged for a superstiff Amplatz wire. At this time, another cavagram was performed demonstrating successful debulking of the caval thrombus. At this point it was decided to proceed with removal of the IVC filter. Over the superstiff Amplatz wire from the right jugular vein approach, the skin tract was dilated and a second 26 [place] Gore dry seal sheath was  advanced and positioned in the suprarenal IVC. A Bard IVC filter retrieval set was then advanced coaxially through this device next to the Amplatz wire. The Bard [place] IVC filter was successfully retrieved using standard technique. Additional thrombectomy was then performed using the angio VAC device. This yielded significant material. Repeat vena cavagram demonstrates complete removal of thrombus from the inferior vena cava. Attention was next turned to the occluded left common iliac artery. The perfusion circuit was reversed and the reperfusion catheter was placed in the right femoral sheath and the angio back device was advanced through the right IJ sheath. Suction thrombectomy was performed in the right common iliac vein. Working from the popliteal access, angioplasty was performed of the common femoral, external iliac and common iliac veins and thrombus was pushed into the AngioVac. There appears to be a significant stenosis at the transition from the common iliac to the external iliac vein. This was resistant to initial angioplasty, so a 14 x 60 mm wall stent was selected, placed across the lesion and deployed. The wall stent was then post dilated to 12 mm using the 12 by 40 Conquest balloon. Catheter venography demonstrates significant near occlusive thrombus throughout the common femoral vein, and femoral vein in the thigh. Therefore, the decision was made to proceed with additional thrombectomy from the popliteal access using the [facility]. The 6 French left popliteal sheath was exchanged over the wire for a 9 French sheath. The is a long take catheter was advanced over the wire and real lytic thrombectomy performed throughout the femoral, common femoral and iliac veins. Follow-up angiography demonstrates significant improvement with restoration of in-line flow. There is a significant residual stenosis at the common femoral vein. This was angioplastied to 12 mm using the 12 x 40 mm  Conquest balloon. On the final angiographic runs, there is successful restoration of in-line flow from the popliteal access through the IVC as well as successful recanalization of both iliac veins. All wires and devices were removed. Once the patient's ACT drop below 200, the sheaths were removed. Hemostasis was attained with a combination of manual pressure and pursestring sutures at both 26 [place] sheath sites. 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 Significant stenosis at the transition from the left common to the left external iliac vein 3. 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 Successful recanalization and angioplasty of the chronically occluded right common iliac vein 2. Successful thrombolysis/thrombectomy of the inferior vena cava, left-sided iliac veins and left lower extremity deep venous system to the level of the popliteal vein. 3. Stent assisted angioplasty of a flow limiting stenosis at the interface of the left common and external iliac veins. 4. Angioplasty of a stenotic segment of the left common femoral  vein. 5. The IVC filter was retrieved.  No additional filter was placed. PLAN: 1. Continue full-dose heparin and transitioned to weight based Lovenox prior to discharge. 2. Progress toward ambulation as soon as clinically possible. 3. Compression garment or Ace wraps to the left lower extremity if needed to facilitate edema reduction. 4. Follow-up with Dr. Laurence Ferrari in interventional radiology clinic in 2 weeks. 5. If patient demonstrates any evidence of bleeding and needs to come off of his anticoagulation in the future, he must have an IVC filter replaced. Signed, Criselda Peaches, MD [facility] Radiology Electronically Signed   By: Jacqulynn Cadet M.D.   On: 01/07/2018 10:46   Ir US Guide Vasc Access Right  Result Date: 01/07/2018 INDICATION: 57 year-old male with metastatic colon cancer  an acute severe iliocaval and left lower extremity DVT. He presents for thrombectomy. EXAM: 1. Ultrasound-guided vascular access left popliteal vein 2. Ultrasound-guided access right common femoral vein 3. Ultrasound-guided access right internal jugular vein 4. Second ultrasound-guided access right internal jugular vein 5. Left lower extremity venogram 6. Right lower extremity venogram 7. Inferior vena cavogram 8. Combined pharmacomechanical thrombolysis/thrombectomy using tPA, the AngioVac device, and the Angiojet device 9. IVC filter retrieval 10. Recanalization and balloon angioplasty of the right common iliac vein 11. Recanalization and angioplasty with subsequent stent placement in the left iliac vein 12. Balloon angioplasty of the left common femoral and femoral veins Operating Physicians: Corrie Mckusick, DO and Jacqulynn Cadet, MD COMPARISON:  CT scan 12/30/2017 MEDICATIONS: 12 mg tPA administered directly into thrombus throughout the course of the procedure ANESTHESIA/SEDATION: General anesthesia provided by the anesthesiology service FLUOROSCOPY TIME:  Fluoroscopy Time: 67 minutes 6 seconds (816.6 mGy). COMPLICATIONS: None immediate. TECHNIQUE: Informed written consent was obtained from the patient after a thorough discussion of the procedural risks, benefits and alternatives. All questions were addressed. Maximal Sterile Barrier Technique was utilized including caps, mask, sterile gowns, sterile gloves, sterile drape, hand hygiene and skin antiseptic. A timeout was performed prior to the initiation of the procedure. The patient was initially positioned prone. The left popliteal vein was interrogated with ultrasound and found to be widely patent. An image was obtained and stored for the medical record. Local anesthesia was attained by infiltration with 1% lidocaine. A small dermatotomy was made. Under real-time sonographic guidance, the vessel was punctured with a 21 gauge micropuncture needle. Using  standard technique, the initial micro needle was exchanged over a 0.018 micro wire for a transitional 4 [place] micro sheath. The micro sheath was then exchanged over a 0.035 wire for a 45 cm 6 [place] braided Arrow sheath. A left lower extremity venogram was performed. The femoral vein is completely occluded in the thigh. The popliteal vein remains patent. 6 mg tPA was laced throughout the thrombus within the common and femoral veins. The patient was repositioned supine and intubated. The right common femoral was interrogated with ultrasound and found to be patent with changes of chronic DVT. An image was obtained and stored for the medical record. Local anesthesia was attained by infiltration with 1% lidocaine. A small dermatotomy was made. Under real-time sonographic guidance, the vessel was punctured with a 21 gauge micropuncture needle. Using standard technique, the initial micro needle was exchanged over a 0.018 micro wire for a transitional 4 [place] micro sheath. The micro sheath was then exchanged over a 0.035 wire for a 6 French vascular sheath. Right lower extremity venography was performed. The common femoral and external iliac veins are  patent. 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 All venous drainage is via parallel spinous collaterals. An angled catheter and glidewire were introduced through the sheath. With some manipulation, the catheter was successfully advanced into the suprarenal IVC. An inferior vena cavagram was performed confirming patency of both renal veins and the super renal IVC. A superstiff Amplatz wire was advanced into the right heart. A 10 x 40 mm Conquest balloon was introduced over the wire the occluded segment of the common iliac vein was angioplastied. Repeat venography demonstrates recreation of a small vascular channel. A 10 x 40 mm Conquest balloon was then selected and introduced over the wire. Venography demonstrates successful recanalization of the common iliac vein.  There is flow through the IVC. 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 The 6 French sheath was exchanged in the soft tissue tract dilated to 24 [place]. A 26 French Gore dry seal sheath was then advanced over the wire and positioned in the right common iliac vein. The right internal jugular vein was interrogated with ultrasound and found to be widely patent. An image was obtained and stored for the medical record. Local anesthesia was attained by infiltration with 1% lidocaine. A small dermatotomy was made. Under real-time sonographic guidance, the vessel was punctured with a 21 gauge micropuncture needle. Using standard technique, the initial micro needle was exchanged over a 0.018 micro wire for a transitional 4 [place] micro sheath. The micro sheath was then exchanged over a 0.035 wire for a 6 French vascular sheath. A superstiff Amplatz wire was advanced into the suprarenal IVC. The skin tract was then serially dilated and an 47 French reperfusion catheter was advanced and position with the tip at the cavoatrial junction. At this time, the patient was fully heparinized until the ACT level was greater than 300 and the AngioVac circuit was established. The angio [facility] device was advanced through the 79 [place] sheath from the right common femoral approach and suction thrombectomy was performed throughout the infrarenal inferior vena cava. Working from the popliteal access, additional left lower extremity venography images were performed focusing on the iliac and common femoral vasculature. Thrombus is present within the common femoral vein and the external iliac vein. The left common iliac vein is completely occluded similar to the other side. Attempts were made to pass through the occluded left common iliac vein from below. This was unsuccessful. Therefore, a second right internal jugular venous access was required. The right internal jugular vein was was again  interrogated with ultrasound and found to be widely patent. An image was obtained and stored for the medical record. Local anesthesia was attained by infiltration with 1% lidocaine. A small dermatotomy was made. Under real-time sonographic guidance, the vessel was punctured with a 21 gauge micropuncture needle. Using standard technique, the initial micro needle was exchanged over a 0.018 micro wire for a transitional 4 [place] micro sheath. The micro sheath was then exchanged over a 0.035 wire for a 6 French vascular sheath. An angled catheter and glidewire were advanced through the sheath, through the IVC filter, through the infrarenal IVC and the catheter successfully navigated across the chronically occluded left common iliac vein. The glidewire was then exchanged for an exchange length Glidewire and snared from the femoral vein in the midthigh and brought through the popliteal sheath access resulting in successful through and through access from the right internal jugular vein through the left popliteal vein. A catheter was then advanced over the wire  into the femoral vein and the glidewire exchanged for a superstiff Amplatz wire. At this time, another cavagram was performed demonstrating successful debulking of the caval thrombus. At this point it was decided to proceed with removal of the IVC filter. Over the superstiff Amplatz wire from the right jugular vein approach, the skin tract was dilated and a second 26 [place] Gore dry seal sheath was advanced and positioned in the suprarenal IVC. A Bard IVC filter retrieval set was then advanced coaxially through this device next to the Amplatz wire. The Bard [place] IVC filter was successfully retrieved using standard technique. Additional thrombectomy was then performed using the angio VAC device. This yielded significant material. Repeat vena cavagram demonstrates complete removal of thrombus from the inferior vena cava. Attention was next turned to the occluded  left common iliac artery. The perfusion circuit was reversed and the reperfusion catheter was placed in the right femoral sheath and the angio back device was advanced through the right IJ sheath. Suction thrombectomy was performed in the right common iliac vein. Working from the popliteal access, angioplasty was performed of the common femoral, external iliac and common iliac veins and thrombus was pushed into the AngioVac. There appears to be a significant stenosis at the transition from the common iliac to the external iliac vein. This was resistant to initial angioplasty, so a 14 x 60 mm wall stent was selected, placed across the lesion and deployed. The wall stent was then post dilated to 12 mm using the 12 by 40 Conquest balloon. Catheter venography demonstrates significant near occlusive thrombus throughout the common femoral vein, and femoral vein in the thigh. Therefore, the decision was made to proceed with additional thrombectomy from the popliteal access using the [facility]. The 6 French left popliteal sheath was exchanged over the wire for a 9 French sheath. The is a long take catheter was advanced over the wire and real lytic thrombectomy performed throughout the femoral, common femoral and iliac veins. Follow-up angiography demonstrates significant improvement with restoration of in-line flow. There is a significant residual stenosis at the common femoral vein. This was angioplastied to 12 mm using the 12 x 40 mm Conquest balloon. On the final angiographic runs, there is successful restoration of in-line flow from the popliteal access through the IVC as well as successful recanalization of both iliac veins. All wires and devices were removed. Once the patient's ACT drop below 200, the sheaths were removed. Hemostasis was attained with a combination of manual pressure and pursestring sutures at both 26 [place] sheath sites. FINDINGS: 1. Chronic occlusion of the right common  iliac vein 2. Significant stenosis at the transition from the left common to the left external iliac vein 3. 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 Successful recanalization and angioplasty of the chronically occluded right common iliac vein 2. Successful thrombolysis/thrombectomy of the inferior vena cava, left-sided iliac veins and left lower extremity deep venous system to the level of the popliteal vein. 3. Stent assisted angioplasty of a flow limiting stenosis at the interface of the left common and external iliac veins. 4. Angioplasty of a stenotic segment of the left common femoral vein. 5. The IVC filter was retrieved.  No additional filter was placed. PLAN: 1. Continue full-dose heparin and transitioned to weight based Lovenox prior to discharge. 2. Progress toward ambulation as soon as clinically possible. 3. Compression garment or Ace wraps to the left lower extremity if needed to facilitate edema reduction. 4. Follow-up  with Dr. Laurence Ferrari in interventional radiology clinic in 2 weeks. 5. If patient demonstrates any evidence of bleeding and needs to come off of his anticoagulation in the future, he must have an IVC filter replaced. Signed, Criselda Peaches, MD [facility] Radiology Electronically Signed   By: Jacqulynn Cadet M.D.   On: 01/07/2018 10:46   Dg Chest Port 1 View  Result Date: 01/08/2018 CLINICAL DATA:  Fever EXAM: PORTABLE CHEST 1 VIEW COMPARISON:  01/06/2018, 08/06/2017 chest x-ray FINDINGS: Right-sided central venous port tip overlies the SVC. No consolidation or effusion. Normal heart size. Negative for a pneumothorax. Endotracheal tube has been removed. IMPRESSION: No active disease. Electronically Signed   By: Donavan Foil M.D.   On: 01/08/2018 03:05   Dg Chest Port 1 View  Result Date: 01/07/2018 CLINICAL DATA:  Respiratory failure EXAM: PORTABLE CHEST 1 VIEW COMPARISON:  None. FINDINGS: The heart  size and mediastinal contours are within normal limits. Both lungs are clear. Endotracheal tube tip is 4.3 cm above the carina in satisfactory position. Port catheter tip is noted in the distal SVC. No effusion or pneumothorax. The visualized skeletal structures are unremarkable. IMPRESSION: Satisfactory support line and tubes.  Clear lungs. Electronically Signed   By: Ashley Royalty M.D.   On: 01/07/2018 03:30   Ir Pta Venous Except Dialysis Circuit  Result Date: 01/07/2018 INDICATION: 57 year-old male with metastatic colon cancer an acute severe iliocaval and left lower extremity DVT. He presents for thrombectomy. EXAM: 1. Ultrasound-guided vascular access left popliteal vein 2. Ultrasound-guided access right common femoral vein 3. Ultrasound-guided access right internal jugular vein 4. Second ultrasound-guided access right internal jugular vein 5. Left lower extremity venogram 6. Right lower extremity venogram 7. Inferior vena cavogram 8. Combined pharmacomechanical thrombolysis/thrombectomy using tPA, the AngioVac device, and the Angiojet device 9. IVC filter retrieval 10. Recanalization and balloon angioplasty of the right common iliac vein 11. Recanalization and angioplasty with subsequent stent placement in the left iliac vein 12. Balloon angioplasty of the left common femoral and femoral veins Operating Physicians: Corrie Mckusick, DO and Jacqulynn Cadet, MD COMPARISON:  CT scan 12/30/2017 MEDICATIONS: 12 mg tPA administered directly into thrombus throughout the course of the procedure ANESTHESIA/SEDATION: General anesthesia provided by the anesthesiology service FLUOROSCOPY TIME:  Fluoroscopy Time: 67 minutes 6 seconds (816.6 mGy). COMPLICATIONS: None immediate. TECHNIQUE: Informed written consent was obtained from the patient after a thorough discussion of the procedural risks, benefits and alternatives. All questions were addressed. Maximal Sterile Barrier Technique was utilized including caps, mask,  sterile gowns, sterile gloves, sterile drape, hand hygiene and skin antiseptic. A timeout was performed prior to the initiation of the procedure. The patient was initially positioned prone. The left popliteal vein was interrogated with ultrasound and found to be widely patent. An image was obtained and stored for the medical record. Local anesthesia was attained by infiltration with 1% lidocaine. A small dermatotomy was made. Under real-time sonographic guidance, the vessel was punctured with a 21 gauge micropuncture needle. Using standard technique, the initial micro needle was exchanged over a 0.018 micro wire for a transitional 4 [place] micro sheath. The micro sheath was then exchanged over a 0.035 wire for a 45 cm 6 [place] braided Arrow sheath. A left lower extremity venogram was performed. The femoral vein is completely occluded in the thigh. The popliteal vein remains patent. 6 mg tPA was laced throughout the thrombus within the common and femoral veins. The patient was repositioned supine and intubated.  The right common femoral was interrogated with ultrasound and found to be patent with changes of chronic DVT. An image was obtained and stored for the medical record. Local anesthesia was attained by infiltration with 1% lidocaine. A small dermatotomy was made. Under real-time sonographic guidance, the vessel was punctured with a 21 gauge micropuncture needle. Using standard technique, the initial micro needle was exchanged over a 0.018 micro wire for a transitional 4 [place] micro sheath. The micro sheath was then exchanged over a 0.035 wire for a 6 French vascular sheath. Right lower extremity venography was performed. The common femoral and external iliac veins are patent. 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 All venous drainage is via parallel spinous collaterals. An angled catheter and glidewire were introduced through the sheath. With some manipulation, the catheter was successfully advanced  into the suprarenal IVC. An inferior vena cavagram was performed confirming patency of both renal veins and the super renal IVC. A superstiff Amplatz wire was advanced into the right heart. A 10 x 40 mm Conquest balloon was introduced over the wire the occluded segment of the common iliac vein was angioplastied. Repeat venography demonstrates recreation of a small vascular channel. A 10 x 40 mm Conquest balloon was then selected and introduced over the wire. Venography demonstrates successful recanalization of the common iliac vein. There is flow through the IVC. 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 The 6 French sheath was exchanged in the soft tissue tract dilated to 24 [place]. A 26 [place] Gore dry seal sheath was then advanced over the wire and positioned in the right common iliac vein. The right internal jugular vein was interrogated with ultrasound and found to be widely patent. An image was obtained and stored for the medical record. Local anesthesia was attained by infiltration with 1% lidocaine. A small dermatotomy was made. Under real-time sonographic guidance, the vessel was punctured with a 21 gauge micropuncture needle. Using standard technique, the initial micro needle was exchanged over a 0.018 micro wire for a transitional 4 [place] micro sheath. The micro sheath was then exchanged over a 0.035 wire for a 6 French vascular sheath. A superstiff Amplatz wire was advanced into the suprarenal IVC. The skin tract was then serially dilated and an 62 French reperfusion catheter was advanced and position with the tip at the cavoatrial junction. At this time, the patient was fully heparinized until the ACT level was greater than 300 and the AngioVac circuit was established. The angio [facility] device was advanced through the 62 [place] sheath from the right common femoral approach and suction thrombectomy was performed throughout the infrarenal inferior vena  cava. Working from the popliteal access, additional left lower extremity venography images were performed focusing on the iliac and common femoral vasculature. Thrombus is present within the common femoral vein and the external iliac vein. The left common iliac vein is completely occluded similar to the other side. Attempts were made to pass through the occluded left common iliac vein from below. This was unsuccessful. Therefore, a second right internal jugular venous access was required. The right internal jugular vein was was again interrogated with ultrasound and found to be widely patent. An image was obtained and stored for the medical record. Local anesthesia was attained by infiltration with 1% lidocaine. A small dermatotomy was made. Under real-time sonographic guidance, the vessel was punctured with a 21 gauge micropuncture needle. Using standard technique, the initial micro needle was exchanged over a 0.018 micro  wire for a transitional 4 [place] micro sheath. The micro sheath was then exchanged over a 0.035 wire for a 6 French vascular sheath. An angled catheter and glidewire were advanced through the sheath, through the IVC filter, through the infrarenal IVC and the catheter successfully navigated across the chronically occluded left common iliac vein. The glidewire was then exchanged for an exchange length Glidewire and snared from the femoral vein in the midthigh and brought through the popliteal sheath access resulting in successful through and through access from the right internal jugular vein through the left popliteal vein. A catheter was then advanced over the wire into the femoral vein and the glidewire exchanged for a superstiff Amplatz wire. At this time, another cavagram was performed demonstrating successful debulking of the caval thrombus. At this point it was decided to proceed with removal of the IVC filter. Over the superstiff Amplatz wire from the right jugular vein approach, the skin  tract was dilated and a second 26 [place] Gore dry seal sheath was advanced and positioned in the suprarenal IVC. A Bard IVC filter retrieval set was then advanced coaxially through this device next to the Amplatz wire. The Bard [place] IVC filter was successfully retrieved using standard technique. Additional thrombectomy was then performed using the angio VAC device. This yielded significant material. Repeat vena cavagram demonstrates complete removal of thrombus from the inferior vena cava. Attention was next turned to the occluded left common iliac artery. The perfusion circuit was reversed and the reperfusion catheter was placed in the right femoral sheath and the angio back device was advanced through the right IJ sheath. Suction thrombectomy was performed in the right common iliac vein. Working from the popliteal access, angioplasty was performed of the common femoral, external iliac and common iliac veins and thrombus was pushed into the AngioVac. There appears to be a significant stenosis at the transition from the common iliac to the external iliac vein. This was resistant to initial angioplasty, so a 14 x 60 mm wall stent was selected, placed across the lesion and deployed. The wall stent was then post dilated to 12 mm using the 12 by 40 Conquest balloon. Catheter venography demonstrates significant near occlusive thrombus throughout the common femoral vein, and femoral vein in the thigh. Therefore, the decision was made to proceed with additional thrombectomy from the popliteal access using the [facility]. The 6 French left popliteal sheath was exchanged over the wire for a 9 French sheath. The is a long take catheter was advanced over the wire and real lytic thrombectomy performed throughout the femoral, common femoral and iliac veins. Follow-up angiography demonstrates significant improvement with restoration of in-line flow. There is a significant residual stenosis at the common  femoral vein. This was angioplastied to 12 mm using the 12 x 40 mm Conquest balloon. On the final angiographic runs, there is successful restoration of in-line flow from the popliteal access through the IVC as well as successful recanalization of both iliac veins. All wires and devices were removed. Once the patient's ACT drop below 200, the sheaths were removed. Hemostasis was attained with a combination of manual pressure and pursestring sutures at both 26 [place] sheath sites. 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 Significant stenosis at the transition from the left common to the left external iliac vein 3. 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 Successful recanalization and angioplasty of the chronically occluded right common iliac vein 2. Successful thrombolysis/thrombectomy of  the inferior vena cava, left-sided iliac veins and left lower extremity deep venous system to the level of the popliteal vein. 3. Stent assisted angioplasty of a flow limiting stenosis at the interface of the left common and external iliac veins. 4. Angioplasty of a stenotic segment of the left common femoral vein. 5. The IVC filter was retrieved.  No additional filter was placed. PLAN: 1. Continue full-dose heparin and transitioned to weight based Lovenox prior to discharge. 2. Progress toward ambulation as soon as clinically possible. 3. Compression garment or Ace wraps to the left lower extremity if needed to facilitate edema reduction. 4. Follow-up with Dr. Laurence Ferrari in interventional radiology clinic in 2 weeks. 5. If patient demonstrates any evidence of bleeding and needs to come off of his anticoagulation in the future, he must have an IVC filter replaced. Signed, Criselda Peaches, MD [facility] Radiology Electronically Signed   By: Jacqulynn Cadet M.D.   On: 01/07/2018 10:46   Ir Pta Venous Addl Except Dialysis  Circuit  Result Date: 01/07/2018 INDICATION: 57 year-old male with metastatic colon cancer an acute severe iliocaval and left lower extremity DVT. He presents for thrombectomy. EXAM: 1. Ultrasound-guided vascular access left popliteal vein 2. Ultrasound-guided access right common femoral vein 3. Ultrasound-guided access right internal jugular vein 4. Second ultrasound-guided access right internal jugular vein 5. Left lower extremity venogram 6. Right lower extremity venogram 7. Inferior vena cavogram 8. Combined pharmacomechanical thrombolysis/thrombectomy using tPA, the AngioVac device, and the Angiojet device 9. IVC filter retrieval 10. Recanalization and balloon angioplasty of the right common iliac vein 11. Recanalization and angioplasty with subsequent stent placement in the left iliac vein 12. Balloon angioplasty of the left common femoral and femoral veins Operating Physicians: Corrie Mckusick, DO and Jacqulynn Cadet, MD COMPARISON:  CT scan 12/30/2017 MEDICATIONS: 12 mg tPA administered directly into thrombus throughout the course of the procedure ANESTHESIA/SEDATION: General anesthesia provided by the anesthesiology service FLUOROSCOPY TIME:  Fluoroscopy Time: 67 minutes 6 seconds (816.6 mGy). COMPLICATIONS: None immediate. TECHNIQUE: Informed written consent was obtained from the patient after a thorough discussion of the procedural risks, benefits and alternatives. All questions were addressed. Maximal Sterile Barrier Technique was utilized including caps, mask, sterile gowns, sterile gloves, sterile drape, hand hygiene and skin antiseptic. A timeout was performed prior to the initiation of the procedure. The patient was initially positioned prone. The left popliteal vein was interrogated with ultrasound and found to be widely patent. An image was obtained and stored for the medical record. Local anesthesia was attained by infiltration with 1% lidocaine. A small dermatotomy was made. Under real-time  sonographic guidance, the vessel was punctured with a 21 gauge micropuncture needle. Using standard technique, the initial micro needle was exchanged over a 0.018 micro wire for a transitional 4 [place] micro sheath. The micro sheath was then exchanged over a 0.035 wire for a 45 cm 6 [place] braided Arrow sheath. A left lower extremity venogram was performed. The femoral vein is completely occluded in the thigh. The popliteal vein remains patent. 6 mg tPA was laced throughout the thrombus within the common and femoral veins. The patient was repositioned supine and intubated. The right common femoral was interrogated with ultrasound and found to be patent with changes of chronic DVT. An image was obtained and stored for the medical record. Local anesthesia was attained by infiltration with 1% lidocaine. A small dermatotomy was made. Under real-time sonographic guidance, the vessel was punctured with a 21 gauge  micropuncture needle. Using standard technique, the initial micro needle was exchanged over a 0.018 micro wire for a transitional 4 [place] micro sheath. The micro sheath was then exchanged over a 0.035 wire for a 6 French vascular sheath. Right lower extremity venography was performed. The common femoral and external iliac veins are patent. 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 All venous drainage is via parallel spinous collaterals. An angled catheter and glidewire were introduced through the sheath. With some manipulation, the catheter was successfully advanced into the suprarenal IVC. An inferior vena cavagram was performed confirming patency of both renal veins and the super renal IVC. A superstiff Amplatz wire was advanced into the right heart. A 10 x 40 mm Conquest balloon was introduced over the wire the occluded segment of the common iliac vein was angioplastied. Repeat venography demonstrates recreation of a small vascular channel. A 10 x 40 mm Conquest balloon was then selected and introduced  over the wire. Venography demonstrates successful recanalization of the common iliac vein. There is flow through the IVC. 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 The 6 French sheath was exchanged in the soft tissue tract dilated to 24 [place]. A 26 [place] Gore dry seal sheath was then advanced over the wire and positioned in the right common iliac vein. The right internal jugular vein was interrogated with ultrasound and found to be widely patent. An image was obtained and stored for the medical record. Local anesthesia was attained by infiltration with 1% lidocaine. A small dermatotomy was made. Under real-time sonographic guidance, the vessel was punctured with a 21 gauge micropuncture needle. Using standard technique, the initial micro needle was exchanged over a 0.018 micro wire for a transitional 4 [place] micro sheath. The micro sheath was then exchanged over a 0.035 wire for a 6 French vascular sheath. A superstiff Amplatz wire was advanced into the suprarenal IVC. The skin tract was then serially dilated and an 7 French reperfusion catheter was advanced and position with the tip at the cavoatrial junction. At this time, the patient was fully heparinized until the ACT level was greater than 300 and the AngioVac circuit was established. The angio [facility] device was advanced through the 35 [place] sheath from the right common femoral approach and suction thrombectomy was performed throughout the infrarenal inferior vena cava. Working from the popliteal access, additional left lower extremity venography images were performed focusing on the iliac and common femoral vasculature. Thrombus is present within the common femoral vein and the external iliac vein. The left common iliac vein is completely occluded similar to the other side. Attempts were made to pass through the occluded left common iliac vein from below. This was unsuccessful. Therefore, a second right  internal jugular venous access was required. The right internal jugular vein was was again interrogated with ultrasound and found to be widely patent. An image was obtained and stored for the medical record. Local anesthesia was attained by infiltration with 1% lidocaine. A small dermatotomy was made. Under real-time sonographic guidance, the vessel was punctured with a 21 gauge micropuncture needle. Using standard technique, the initial micro needle was exchanged over a 0.018 micro wire for a transitional 4 [place] micro sheath. The micro sheath was then exchanged over a 0.035 wire for a 6 French vascular sheath. An angled catheter and glidewire were advanced through the sheath, through the IVC filter, through the infrarenal IVC and the catheter successfully navigated across the chronically occluded left common iliac  vein. The glidewire was then exchanged for an exchange length Glidewire and snared from the femoral vein in the midthigh and brought through the popliteal sheath access resulting in successful through and through access from the right internal jugular vein through the left popliteal vein. A catheter was then advanced over the wire into the femoral vein and the glidewire exchanged for a superstiff Amplatz wire. At this time, another cavagram was performed demonstrating successful debulking of the caval thrombus. At this point it was decided to proceed with removal of the IVC filter. Over the superstiff Amplatz wire from the right jugular vein approach, the skin tract was dilated and a second 26 [place] Gore dry seal sheath was advanced and positioned in the suprarenal IVC. A Bard IVC filter retrieval set was then advanced coaxially through this device next to the Amplatz wire. The Bard [place] IVC filter was successfully retrieved using standard technique. Additional thrombectomy was then performed using the angio VAC device. This yielded significant material. Repeat vena cavagram demonstrates complete  removal of thrombus from the inferior vena cava. Attention was next turned to the occluded left common iliac artery. The perfusion circuit was reversed and the reperfusion catheter was placed in the right femoral sheath and the angio back device was advanced through the right IJ sheath. Suction thrombectomy was performed in the right common iliac vein. Working from the popliteal access, angioplasty was performed of the common femoral, external iliac and common iliac veins and thrombus was pushed into the AngioVac. There appears to be a significant stenosis at the transition from the common iliac to the external iliac vein. This was resistant to initial angioplasty, so a 14 x 60 mm wall stent was selected, placed across the lesion and deployed. The wall stent was then post dilated to 12 mm using the 12 by 40 Conquest balloon. Catheter venography demonstrates significant near occlusive thrombus throughout the common femoral vein, and femoral vein in the thigh. Therefore, the decision was made to proceed with additional thrombectomy from the popliteal access using the [facility]. The 6 French left popliteal sheath was exchanged over the wire for a 9 French sheath. The is a long take catheter was advanced over the wire and real lytic thrombectomy performed throughout the femoral, common femoral and iliac veins. Follow-up angiography demonstrates significant improvement with restoration of in-line flow. There is a significant residual stenosis at the common femoral vein. This was angioplastied to 12 mm using the 12 x 40 mm Conquest balloon. On the final angiographic runs, there is successful restoration of in-line flow from the popliteal access through the IVC as well as successful recanalization of both iliac veins. All wires and devices were removed. Once the patient's ACT drop below 200, the sheaths were removed. Hemostasis was attained with a combination of manual pressure and pursestring  sutures at both 26 [place] sheath sites. 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 Significant stenosis at the transition from the left common to the left external iliac vein 3. 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 Successful recanalization and angioplasty of the chronically occluded right common iliac vein 2. Successful thrombolysis/thrombectomy of the inferior vena cava, left-sided iliac veins and left lower extremity deep venous system to the level of the popliteal vein. 3. Stent assisted angioplasty of a flow limiting stenosis at the interface of the left common and external iliac veins. 4. Angioplasty of a stenotic segment of the left common femoral vein. 5.  The IVC filter was retrieved.  No additional filter was placed. PLAN: 1. Continue full-dose heparin and transitioned to weight based Lovenox prior to discharge. 2. Progress toward ambulation as soon as clinically possible. 3. Compression garment or Ace wraps to the left lower extremity if needed to facilitate edema reduction. 4. Follow-up with Dr. Laurence Ferrari in interventional radiology clinic in 2 weeks. 5. If patient demonstrates any evidence of bleeding and needs to come off of his anticoagulation in the future, he must have an IVC filter replaced. Signed, Criselda Peaches, MD [facility] Electronically Signed   By: Jacqulynn Cadet M.D.   On: 01/07/2018 10:46   Ir [employer] Stent  Initial Vein  Inc Angioplasty  Result Date: 01/07/2018 INDICATION: 57 year-old male with metastatic colon cancer an acute severe iliocaval and left lower extremity DVT. He presents for thrombectomy. EXAM: 1. Ultrasound-guided vascular access left popliteal vein 2. Ultrasound-guided access right common femoral vein 3. Ultrasound-guided access right internal jugular vein 4. Second ultrasound-guided access right internal jugular vein 5. Left lower extremity  venogram 6. Right lower extremity venogram 7. Inferior vena cavogram 8. Combined pharmacomechanical thrombolysis/thrombectomy using tPA, the AngioVac device, and the Angiojet device 9. IVC filter retrieval 10. Recanalization and balloon angioplasty of the right common iliac vein 11. Recanalization and angioplasty with subsequent stent placement in the left iliac vein 12. Balloon angioplasty of the left common femoral and femoral veins Operating Physicians: Corrie Mckusick, DO and Jacqulynn Cadet, MD COMPARISON:  CT scan 12/30/2017 MEDICATIONS: 12 mg tPA administered directly into thrombus throughout the course of the procedure ANESTHESIA/SEDATION: General anesthesia provided by the anesthesiology service FLUOROSCOPY TIME:  Fluoroscopy Time: 67 minutes 6 seconds (816.6 mGy). COMPLICATIONS: None immediate. TECHNIQUE: Informed written consent was obtained from the patient after a thorough discussion of the procedural risks, benefits and alternatives. All questions were addressed. Maximal Sterile Barrier Technique was utilized including caps, mask, sterile gowns, sterile gloves, sterile drape, hand hygiene and skin antiseptic. A timeout was performed prior to the initiation of the procedure. The patient was initially positioned prone. The left popliteal vein was interrogated with ultrasound and found to be widely patent. An image was obtained and stored for the medical record. Local anesthesia was attained by infiltration with 1% lidocaine. A small dermatotomy was made. Under real-time sonographic guidance, the vessel was punctured with a 21 gauge micropuncture needle. Using standard technique, the initial micro needle was exchanged over a 0.018 micro wire for a transitional 4 [place] micro sheath. The micro sheath was then exchanged over a 0.035 wire for a 45 cm 6 [place] braided Arrow sheath. A left lower extremity venogram was performed. The femoral vein is completely occluded in the thigh. The popliteal vein remains  patent. 6 mg tPA was laced throughout the thrombus within the common and femoral veins. The patient was repositioned supine and intubated. The right common femoral was interrogated with ultrasound and found to be patent with changes of chronic DVT. An image was obtained and stored for the medical record. Local anesthesia was attained by infiltration with 1% lidocaine. A small dermatotomy was made. Under real-time sonographic guidance, the vessel was punctured with a 21 gauge micropuncture needle. Using standard technique, the initial micro needle was exchanged over a 0.018 micro wire for a transitional 4 [place] micro sheath. The micro sheath was then exchanged over a 0.035 wire for a 6 French vascular sheath. Right lower extremity venography was performed. The common femoral and external iliac  veins are patent. 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 All venous drainage is via parallel spinous collaterals. An angled catheter and glidewire were introduced through the sheath. With some manipulation, the catheter was successfully advanced into the suprarenal IVC. An inferior vena cavagram was performed confirming patency of both renal veins and the super renal IVC. A superstiff Amplatz wire was advanced into the right heart. A 10 x 40 mm Conquest balloon was introduced over the wire the occluded segment of the common iliac vein was angioplastied. Repeat venography demonstrates recreation of a small vascular channel. A 10 x 40 mm Conquest balloon was then selected and introduced over the wire. Venography demonstrates successful recanalization of the common iliac vein. There is flow through the IVC. 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 The 6 French sheath was exchanged in the soft tissue tract dilated to 24 [place]. A 26 French Gore dry seal sheath was then advanced over the wire and positioned in the right common iliac vein. The right internal jugular vein  was interrogated with ultrasound and found to be widely patent. An image was obtained and stored for the medical record. Local anesthesia was attained by infiltration with 1% lidocaine. A small dermatotomy was made. Under real-time sonographic guidance, the vessel was punctured with a 21 gauge micropuncture needle. Using standard technique, the initial micro needle was exchanged over a 0.018 micro wire for a transitional 4 [place] micro sheath. The micro sheath was then exchanged over a 0.035 wire for a 6 French vascular sheath. A superstiff Amplatz wire was advanced into the suprarenal IVC. The skin tract was then serially dilated and an 28 French reperfusion catheter was advanced and position with the tip at the cavoatrial junction. At this time, the patient was fully heparinized until the ACT level was greater than 300 and the AngioVac circuit was established. The angio [facility] device was advanced through the 71 French sheath from the right common femoral approach and suction thrombectomy was performed throughout the infrarenal inferior vena cava. Working from the popliteal access, additional left lower extremity venography images were performed focusing on the iliac and common femoral vasculature. Thrombus is present within the common femoral vein and the external iliac vein. The left common iliac vein is completely occluded similar to the other side. Attempts were made to pass through the occluded left common iliac vein from below. This was unsuccessful. Therefore, a second right internal jugular venous access was required. The right internal jugular vein was was again interrogated with ultrasound and found to be widely patent. An image was obtained and stored for the medical record. Local anesthesia was attained by infiltration with 1% lidocaine. A small dermatotomy was made. Under real-time sonographic guidance, the vessel was punctured with a 21 gauge micropuncture needle. Using standard technique, the initial  micro needle was exchanged over a 0.018 micro wire for a transitional 4 [place] micro sheath. The micro sheath was then exchanged over a 0.035 wire for a 6 French vascular sheath. An angled catheter and glidewire were advanced through the sheath, through the IVC filter, through the infrarenal IVC and the catheter successfully navigated across the chronically occluded left common iliac vein. The glidewire was then exchanged for an exchange length Glidewire and snared from the femoral vein in the midthigh and brought through the popliteal sheath access resulting in successful through and through access from the right internal jugular vein through the left popliteal vein. A catheter was then advanced over  the wire into the femoral vein and the glidewire exchanged for a superstiff Amplatz wire. At this time, another cavagram was performed demonstrating successful debulking of the caval thrombus. At this point it was decided to proceed with removal of the IVC filter. Over the superstiff Amplatz wire from the right jugular vein approach, the skin tract was dilated and a second 26 [place] Gore dry seal sheath was advanced and positioned in the suprarenal IVC. A Bard IVC filter retrieval set was then advanced coaxially through this device next to the Amplatz wire. The Bard [place] IVC filter was successfully retrieved using standard technique. Additional thrombectomy was then performed using the angio VAC device. This yielded significant material. Repeat vena cavagram demonstrates complete removal of thrombus from the inferior vena cava. Attention was next turned to the occluded left common iliac artery. The perfusion circuit was reversed and the reperfusion catheter was placed in the right femoral sheath and the angio back device was advanced through the right IJ sheath. Suction thrombectomy was performed in the right common iliac vein. Working from the popliteal access, angioplasty was performed of the common femoral,  external iliac and common iliac veins and thrombus was pushed into the AngioVac. There appears to be a significant stenosis at the transition from the common iliac to the external iliac vein. This was resistant to initial angioplasty, so a 14 x 60 mm wall stent was selected, placed across the lesion and deployed. The wall stent was then post dilated to 12 mm using the 12 by 40 Conquest balloon. Catheter venography demonstrates significant near occlusive thrombus throughout the common femoral vein, and femoral vein in the thigh. Therefore, the decision was made to proceed with additional thrombectomy from the popliteal access using the [facility]. The 6 French left popliteal sheath was exchanged over the wire for a 9 French sheath. The is a long take catheter was advanced over the wire and real lytic thrombectomy performed throughout the femoral, common femoral and iliac veins. Follow-up angiography demonstrates significant improvement with restoration of in-line flow. There is a significant residual stenosis at the common femoral vein. This was angioplastied to 12 mm using the 12 x 40 mm Conquest balloon. On the final angiographic runs, there is successful restoration of in-line flow from the popliteal access through the IVC as well as successful recanalization of both iliac veins. All wires and devices were removed. Once the patient's ACT drop below 200, the sheaths were removed. Hemostasis was attained with a combination of manual pressure and pursestring sutures at both 26 [place] sheath sites. 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 Significant stenosis at the transition from the left common to the left external iliac vein 3. 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 Successful recanalization and angioplasty of the chronically occluded right common iliac vein 2. Successful thrombolysis/thrombectomy of the inferior vena cava, left-sided iliac  veins and left lower extremity deep venous system to the level of the popliteal vein. 3. Stent assisted angioplasty of a flow limiting stenosis at the interface of the left common and external iliac veins. 4. Angioplasty of a stenotic segment of the left common femoral vein. 5. The IVC filter was retrieved.  No additional filter was placed. PLAN: 1. Continue full-dose heparin and transitioned to weight based Lovenox prior to discharge. 2. Progress toward ambulation as soon as clinically possible. 3. Compression garment or Ace wraps to the left lower extremity if needed to facilitate edema reduction.  4. Follow-up with Dr. Laurence Ferrari in interventional radiology clinic in 2 weeks. 5. If patient demonstrates any evidence of bleeding and needs to come off of his anticoagulation in the future, he must have an IVC filter replaced. Signed, Criselda Peaches, MD [facility] Radiology Electronically Signed   By: Jacqulynn Cadet M.D.   On: 01/07/2018 10:46   Ir Ivc Filter Retrieval / S&i /img Guid/mod Sed  Result Date: 01/07/2018 INDICATION: 57 year-old male with metastatic colon cancer an acute severe iliocaval and left lower extremity DVT. He presents for thrombectomy. EXAM: 1. Ultrasound-guided vascular access left popliteal vein 2. Ultrasound-guided access right common femoral vein 3. Ultrasound-guided access right internal jugular vein 4. Second ultrasound-guided access right internal jugular vein 5. Left lower extremity venogram 6. Right lower extremity venogram 7. Inferior vena cavogram 8. Combined pharmacomechanical thrombolysis/thrombectomy using tPA, the AngioVac device, and the Angiojet device 9. IVC filter retrieval 10. Recanalization and balloon angioplasty of the right common iliac vein 11. Recanalization and angioplasty with subsequent stent placement in the left iliac vein 12. Balloon angioplasty of the left common femoral and femoral veins Operating  Physicians: Corrie Mckusick, DO and Jacqulynn Cadet, MD COMPARISON:  CT scan 12/30/2017 MEDICATIONS: 12 mg tPA administered directly into thrombus throughout the course of the procedure ANESTHESIA/SEDATION: General anesthesia provided by the anesthesiology service FLUOROSCOPY TIME:  Fluoroscopy Time: 67 minutes 6 seconds (816.6 mGy). COMPLICATIONS: None immediate. TECHNIQUE: Informed written consent was obtained from the patient after a thorough discussion of the procedural risks, benefits and alternatives. All questions were addressed. Maximal Sterile Barrier Technique was utilized including caps, mask, sterile gowns, sterile gloves, sterile drape, hand hygiene and skin antiseptic. A timeout was performed prior to the initiation of the procedure. The patient was initially positioned prone. The left popliteal vein was interrogated with ultrasound and found to be widely patent. An image was obtained and stored for the medical record. Local anesthesia was attained by infiltration with 1% lidocaine. A small dermatotomy was made. Under real-time sonographic guidance, the vessel was punctured with a 21 gauge micropuncture needle. Using standard technique, the initial micro needle was exchanged over a 0.018 micro wire for a transitional 4 [place] micro sheath. The micro sheath was then exchanged over a 0.035 wire for a 45 cm 6 [place] braided Arrow sheath. A left lower extremity venogram was performed. The femoral vein is completely occluded in the thigh. The popliteal vein remains patent. 6 mg tPA was laced throughout the thrombus within the common and femoral veins. The patient was repositioned supine and intubated. The right common femoral was interrogated with ultrasound and found to be patent with changes of chronic DVT. An image was obtained and stored for the medical record. Local anesthesia was attained by infiltration with 1% lidocaine. A small dermatotomy was made. Under real-time sonographic guidance, the vessel  was punctured with a 21 gauge micropuncture needle. Using standard technique, the initial micro needle was exchanged over a 0.018 micro wire for a transitional 4 [place] micro sheath. The micro sheath was then exchanged over a 0.035 wire for a 6 French vascular sheath. Right lower extremity venography was performed. The common femoral and external iliac veins are patent. 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 All venous drainage is via parallel spinous collaterals. An angled catheter and glidewire were introduced through the sheath. With some manipulation, the catheter was successfully advanced into the suprarenal IVC. An inferior vena cavagram was performed confirming patency of both renal veins  and the super renal IVC. A superstiff Amplatz wire was advanced into the right heart. A 10 x 40 mm Conquest balloon was introduced over the wire the occluded segment of the common iliac vein was angioplastied. Repeat venography demonstrates recreation of a small vascular channel. A 10 x 40 mm Conquest balloon was then selected and introduced over the wire. Venography demonstrates successful recanalization of the common iliac vein. There is flow through the IVC. 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 The 6 French sheath was exchanged in the soft tissue tract dilated to 24 [place]. A 26 French Gore dry seal sheath was then advanced over the wire and positioned in the right common iliac vein. The right internal jugular vein was interrogated with ultrasound and found to be widely patent. An image was obtained and stored for the medical record. Local anesthesia was attained by infiltration with 1% lidocaine. A small dermatotomy was made. Under real-time sonographic guidance, the vessel was punctured with a 21 gauge micropuncture needle. Using standard technique, the initial micro needle was exchanged over a 0.018 micro wire for a transitional 4 [place] micro sheath. The  micro sheath was then exchanged over a 0.035 wire for a 6 French vascular sheath. A superstiff Amplatz wire was advanced into the suprarenal IVC. The skin tract was then serially dilated and an 1 French reperfusion catheter was advanced and position with the tip at the cavoatrial junction. At this time, the patient was fully heparinized until the ACT level was greater than 300 and the AngioVac circuit was established. The angio [facility] device was advanced through the 59 French sheath from the right common femoral approach and suction thrombectomy was performed throughout the infrarenal inferior vena cava. Working from the popliteal access, additional left lower extremity venography images were performed focusing on the iliac and common femoral vasculature. Thrombus is present within the common femoral vein and the external iliac vein. The left common iliac vein is completely occluded similar to the other side. Attempts were made to pass through the occluded left common iliac vein from below. This was unsuccessful. Therefore, a second right internal jugular venous access was required. The right internal jugular vein was was again interrogated with ultrasound and found to be widely patent. An image was obtained and stored for the medical record. Local anesthesia was attained by infiltration with 1% lidocaine. A small dermatotomy was made. Under real-time sonographic guidance, the vessel was punctured with a 21 gauge micropuncture needle. Using standard technique, the initial micro needle was exchanged over a 0.018 micro wire for a transitional 4 [place] micro sheath. The micro sheath was then exchanged over a 0.035 wire for a 6 French vascular sheath. An angled catheter and glidewire were advanced through the sheath, through the IVC filter, through the infrarenal IVC and the catheter successfully navigated across the chronically occluded left common iliac vein. The glidewire was then exchanged for an exchange length  Glidewire and snared from the femoral vein in the midthigh and brought through the popliteal sheath access resulting in successful through and through access from the right internal jugular vein through the left popliteal vein. A catheter was then advanced over the wire into the femoral vein and the glidewire exchanged for a superstiff Amplatz wire. At this time, another cavagram was performed demonstrating successful debulking of the caval thrombus. At this point it was decided to proceed with removal of the IVC filter. Over the superstiff Amplatz wire from the right jugular  vein approach, the skin tract was dilated and a second 26 [place] Gore dry seal sheath was advanced and positioned in the suprarenal IVC. A Bard IVC filter retrieval set was then advanced coaxially through this device next to the Amplatz wire. The Bard [place] IVC filter was successfully retrieved using standard technique. Additional thrombectomy was then performed using the angio VAC device. This yielded significant material. Repeat vena cavagram demonstrates complete removal of thrombus from the inferior vena cava. Attention was next turned to the occluded left common iliac artery. The perfusion circuit was reversed and the reperfusion catheter was placed in the right femoral sheath and the angio back device was advanced through the right IJ sheath. Suction thrombectomy was performed in the right common iliac vein. Working from the popliteal access, angioplasty was performed of the common femoral, external iliac and common iliac veins and thrombus was pushed into the AngioVac. There appears to be a significant stenosis at the transition from the common iliac to the external iliac vein. This was resistant to initial angioplasty, so a 14 x 60 mm wall stent was selected, placed across the lesion and deployed. The wall stent was then post dilated to 12 mm using the 12 by 40 Conquest balloon. Catheter venography demonstrates significant near  occlusive thrombus throughout the common femoral vein, and femoral vein in the thigh. Therefore, the decision was made to proceed with additional thrombectomy from the popliteal access using the [facility]. The 6 French left popliteal sheath was exchanged over the wire for a 9 French sheath. The is a long take catheter was advanced over the wire and real lytic thrombectomy performed throughout the femoral, common femoral and iliac veins. Follow-up angiography demonstrates significant improvement with restoration of in-line flow. There is a significant residual stenosis at the common femoral vein. This was angioplastied to 12 mm using the 12 x 40 mm Conquest balloon. On the final angiographic runs, there is successful restoration of in-line flow from the popliteal access through the IVC as well as successful recanalization of both iliac veins. All wires and devices were removed. Once the patient's ACT drop below 200, the sheaths were removed. Hemostasis was attained with a combination of manual pressure and pursestring sutures at both 26 [place] sheath sites. 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 Significant stenosis at the transition from the left common to the left external iliac vein 3. 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 Successful recanalization and angioplasty of the chronically occluded right common iliac vein 2. Successful thrombolysis/thrombectomy of the inferior vena cava, left-sided iliac veins and left lower extremity deep venous system to the level of the popliteal vein. 3. Stent assisted angioplasty of a flow limiting stenosis at the interface of the left common and external iliac veins. 4. Angioplasty of a stenotic segment of the left common femoral vein. 5. The IVC filter was retrieved.  No additional filter was placed. PLAN: 1. Continue full-dose heparin and transitioned to weight based Lovenox prior to discharge. 2. Progress  toward ambulation as soon as clinically possible. 3. Compression garment or Ace wraps to the left lower extremity if needed to facilitate edema reduction. 4. Follow-up with Dr. Laurence Ferrari in interventional radiology clinic in 2 weeks. 5. If patient demonstrates any evidence of bleeding and needs to come off of his anticoagulation in the future, he must have an IVC filter replaced. Signed, Criselda Peaches, MD [facility] Radiology Electronically Signed  By: Jacqulynn Cadet M.D.   On: 01/07/2018 10:46    Labs:  CBC: Recent Labs    01/06/18 0749 01/06/18 1503 01/06/18 2311 01/07/18 0351 01/08/18 0249  WBC 5.4  --  5.0 6.7 6.0  HGB 7.8* 10.5* 7.9* 7.5* 5.9*  HCT 25.8* 31.0* 23.9* 23.1* 18.5*  PLT 160  --  115* 128* 102*    COAGS: Recent Labs    01/29/17 1256 07/11/17 0340  08/12/17 0527 08/19/17 1047  01/02/18 1815 01/03/18 0158 01/03/18 0829 01/04/18 0615  INR 1.07 1.27  --  1.02 1.77  --   --   --   --   --   APTT 31  --    < >  --   --    < > 44* 72* 82* 91*   < > = values in this interval not displayed.    BMP: Recent Labs    01/02/18 0537 01/06/18 1503 01/06/18 2311 01/07/18 0351 01/08/18 0249  NA 132* 138 134* 136 138  K 3.5 3.8 4.4 4.5 3.7  CL 99*  --  102 104 106  CO2 26  --  23 24 27   GLUCOSE 101* 111* 151* 136* 114*  BUN 8  --  9 11 11   CALCIUM 8.1*  --  7.5* 7.7* 7.3*  CREATININE 0.69  --  0.73 0.80 0.75  GFRNONAA >60  --  >60 >60 >60  GFRAA >60  --  >60 >60 >60    LIVER FUNCTION TESTS: Recent Labs    11/29/17 0950 12/13/17 0835 12/27/17 0820 12/30/17 1907  BILITOT 0.34 0.31 0.52 0.7  AST 21 29 16 22   ALT 9 10 6  8*  ALKPHOS 94 95 69 66  PROT 7.9 8.2 7.8 8.4*  ALBUMIN 3.6 3.7 3.0* 3.2*    Assessment and Plan: Acute iliocaval thrombosis, with extension into the left CFV and femoral vein, secondary to filter and hx of malignancy.  S/PIVC filter retrieval AngioVac aspiration  thrombectomy of IVC and Iliac thrombus Balloon angioplasty and stenting of chronic left external iliac vein occlusion Balloon angioplasty for maceration of LLE thrombus AngioJet aspiration thrombectomy of the L femoral, CFV, and iliac system for DVT treatment By Dr. Laurence Ferrari and Dr. Earleen Newport 01/07/2018  Temp 99; hgb 5.9 prior to transfusion today(2 units), now 8.4; creat nl; IV heparin held; CT A/P today-  No evidence of abd-pelvic hemorrhage; rec serial hgb checks; resumption of anticoagulation dependent upon stability of hgb and no further clinical evidence of bleeding.If sig bleeding occurs may have to replace IVC filter.  Electronically Signed: D. Rowe Robert, PA-C 01/08/2018, 1:33 PM   I spent a total of 20 minutes  at the the patient's bedside AND on the patient's hospital floor or unit, greater than 50% of which was counseling/coordinating care for     Patient ID: VERLE WHEELING, male   [name]: [birth year], 59 y.o.   MRN: [id number]

## 2018-01-08 NOTE — Progress Notes (Signed)
PROGRESS NOTE  Jeff [name] NWG:[id number] [name]: [birth year] [name]: 12/30/2017 PCP: [name], MD  HPI/Recap of past 24 hours: Jeff [name] is a 57 y.o. 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 Iimaging studies notable for worsening left LE DVT. Initially hesitant to start anticoagulation due to known history of GI bleed while on Xarelto. Started on Eliquis and transferred to [facility] for thrombectomy. Post op #2. Transferred to ICU for vent management. Extubated today 01/07/18 and breathing well on ambient air.  Febrile, hypotensive with acute hg drop to 5.9 from 7.5 on heparin drip. Blood cx x2 in process. U/A unremarkable for infective process, +hematuria. CXR unremarkable for active disease. Transfusing 2U PRBCs. CT abd pelvis wo contrast ordered, ruled out intraabdominal hemorrhage.  Assessment/Plan: Principal Problem:   Evaluation by psychiatric service required Active Problems:   DVT (deep venous thrombosis) (HCC)   Malignant neoplasm of colon ([place])   Palliative care by specialist   Advance care planning   DVT, bilateral lower limbs ([name])  Left lowerextremity DVT, acute post thrombectomy POD#2 by IR -Continue heparin drip -Transferred from [facility] long hospital to [facility] on 01/05/18 -CT scan showed extensive thrombosis in the IVC, common iliac, left iliac vein and femoral vein -prior history of GI bleeding from his left colon mass when he was onmaintenance Xarelto,he is at high risk for recurrent bleeding from anticoagulation,  -MRI brain without metastatic disease -Pt agrees to continueanticoagulation after procedure, will need Lovenox  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 -on palliative chemotherapy 5-FU and Panitumumab -last chemo session 11/27/2017, follows with  Dr Burr Medico  Acute on chronic chronic anemia of chronic disease -anemia in the setting of underlying colon cancer & secondary to chemotherapy, high risk for bleeding and further blood loss and on anticoagulation watch closely and transfuse as clinically indicated -No evidence of bleeding -CBC am  Moderate Protein and calorie malnutrition with Anorexia -supplements advised dietitian consult requested, c/n Remeron for appetite stimulation   Code Status: full  Family Communication: no family members at bedside   Disposition Plan: will stay another midnight to continue current management   Consultants:  IR   CCM for vent management while pt was intubated   Procedures:  Intubation 01/06/18  thombectomy 01/06/18  Antimicrobials:  None  DVT prophylaxis:  heparin drip   Objective: Vitals:   01/08/18 0500 01/08/18 0600 01/08/18 0644 01/08/18 0801  BP:   101/61 (!) 90/46  Pulse: (!) 113 (!) 103 (!) 102 (!) 106  Resp: 17 15 17 18   Temp:   98.9 F (37.2 C) 98.4 F (36.9 C)  TempSrc:   Tympanic Oral  SpO2: 100% 100% 100% 100%  Weight:      Height:        Intake/Output Summary (Last 24 hours) at 01/08/2018 0832 Last data filed at 01/08/2018 0801 Gross per 24 hour  Intake 2728.58 ml  Output 2175 ml  Net 553.58 ml   Filed Weights   01/06/18 2200 01/07/18 2200 01/08/18 0426  Weight: 63.3 kg (139 lb 8.8 oz) 63.3 kg (139 lb 8.8 oz) 65.9 kg (145 lb 4.5 oz)    Exam:   General:  57 yo AAM WD WN NAD A&o x3  Cardiovascular: RRR no rubs or gallops   Respiratory: CTA no wheezes or rales   Abdomen: colonic  pouch on right abd quadrant NBS x4 NT ND  Musculoskeletal: non focal, Trace edema in RLE and 1+ pitting edema in LLE  Skin: no open sores  Psychiatry: mood is appropriate for condition and setting   Data Reviewed: CBC: Recent Labs  Lab 01/05/18 0601 01/06/18 0749 01/06/18 1503 01/06/18 2311 01/07/18 0351 01/08/18 0249  WBC 5.5 5.4  --  5.0 6.7 6.0    NEUTROABS  --   --   --  3.7  --  3.5  HGB 8.4* 7.8* 10.5* 7.9* 7.5* 5.9*  HCT 26.3* 25.8* 31.0* 23.9* 23.1* 18.5*  MCV 84.3 84.6  --  82.7 82.5 84.5  PLT 180 160  --  115* 128* 948*   Basic Metabolic Panel: Recent Labs  Lab 01/02/18 0537 01/06/18 1503 01/06/18 2311 01/07/18 0351 01/08/18 0249  NA 132* 138 134* 136 138  K 3.5 3.8 4.4 4.5 3.7  CL 99*  --  102 104 106  CO2 26  --  23 24 27   GLUCOSE 101* 111* 151* 136* 114*  BUN 8  --  9 11 11   CREATININE 0.69  --  0.73 0.80 0.75  CALCIUM 8.1*  --  7.5* 7.7* 7.3*  MG 0.9*  --  0.9*  --  1.0*  PHOS  --   --  3.7  --  2.2*   GFR: Estimated Creatinine Clearance: 93.8 mL/min (by C-G formula based on SCr of 0.75 mg/dL). Liver Function Tests: No results for input(s): AST, ALT, ALKPHOS, BILITOT, PROT, ALBUMIN in the last 168 hours. No results for input(s): LIPASE, AMYLASE in the last 168 hours. No results for input(s): AMMONIA in the last 168 hours. Coagulation Profile: No results for input(s): INR, PROTIME in the last 168 hours. Cardiac Enzymes: No results for input(s): CKTOTAL, CKMB, CKMBINDEX, TROPONINI in the last 168 hours. BNP (last 3 results) No results for input(s): PROBNP in the last 8760 hours. HbA1C: No results for input(s): HGBA1C in the last 72 hours. CBG: Recent Labs  Lab 01/06/18 2205  GLUCAP 138*   Lipid Profile: Recent Labs    01/06/18 2311  TRIG 84   Thyroid Function Tests: No results for input(s): TSH, T4TOTAL, FREET4, T3FREE, THYROIDAB in the last 72 hours. Anemia Panel: No results for input(s): VITAMINB12, FOLATE, FERRITIN, TIBC, IRON, RETICCTPCT in the last 72 hours. Urine analysis:    Component Value Date/Time   COLORURINE YELLOW 01/08/2018 0247   APPEARANCEUR CLEAR 01/08/2018 0247   LABSPEC 1.017 01/08/2018 0247   PHURINE 7.0 01/08/2018 0247   GLUCOSEU NEGATIVE 01/08/2018 0247   HGBUR MODERATE (A) 01/08/2018 0247   BILIRUBINUR NEGATIVE 01/08/2018 0247   KETONESUR NEGATIVE 01/08/2018  0247   PROTEINUR NEGATIVE 01/08/2018 0247   NITRITE NEGATIVE 01/08/2018 0247   LEUKOCYTESUR NEGATIVE 01/08/2018 0247   Sepsis Labs: @LABRCNTIP (procalcitonin:4,lacticidven:4)  ) Recent Results (from the past 240 hour(s))  Urine culture     Status: None   Collection Time: 12/30/17  8:50 PM  Result Value Ref Range Status   Specimen Description URINE, CLEAN CATCH  Final   Special Requests NONE  Final   Culture   Final    NO GROWTH Performed at [facility], [place] [address], [place], [place] [address]    Report Status 01/01/2018 FINAL  Final  Surgical pcr screen     Status: Abnormal   Collection Time: 01/05/18  6:15 PM  Result Value Ref Range Status   MRSA, PCR NEGATIVE NEGATIVE Final   Staphylococcus aureus POSITIVE (A) NEGATIVE  Final    Comment: (NOTE) The Xpert SA Assay (FDA approved for NASAL specimens in patients 48 years of age and older), is one component of a comprehensive surveillance program. It is not intended to diagnose infection nor to guide or monitor treatment.       Studies: Dg Chest Port 1 View  Result Date: 01/08/2018 CLINICAL DATA:  Fever EXAM: PORTABLE CHEST 1 VIEW COMPARISON:  01/06/2018, 08/06/2017 chest x-ray FINDINGS: Right-sided central venous port tip overlies the SVC. No consolidation or effusion. Normal heart size. Negative for a pneumothorax. Endotracheal tube has been removed. IMPRESSION: No active disease. Electronically Signed   By: Donavan Foil M.D.   On: 01/08/2018 03:05    Scheduled Meds: . Chlorhexidine Gluconate Cloth  6 each Topical Daily  . docusate sodium  100 mg Oral BID  . feeding supplement (ENSURE ENLIVE)  120 mL Oral TID  . gabapentin  100 mg Oral Q8H  . mirtazapine  15 mg Per Tube QHS  . multivitamin with minerals  1 tablet Oral Daily  . mupirocin ointment  1 application Nasal BID  . pantoprazole  40 mg Oral BID    Continuous Infusions:    LOS: 7 days     Kayleen Memos, MD [facility] Pager  [phone]  If 7PM-7AM, please contact night-coverage [email or web address] Password [facility] 01/08/2018, 8:32 AM

## 2018-01-08 NOTE — Progress Notes (Signed)
Provider on call notified about patient elevated temp. of 102.5, low Bp. 84/45 and HR in the 120 to 140's. Patient has about 4 blankets on with room temp at 76. Does not want to reduce the heat or number of blankets. Daughter called and updated. Ask daughter to call back with question.

## 2018-01-08 NOTE — Progress Notes (Signed)
ANTICOAGULATION CONSULT NOTE - Follow-Up Consult  Pharmacy Consult for heparin Indication: DVT  No Known Allergies  Patient Measurements: Height: 5\' 9"  (175.3 cm) Weight: 145 lb 4.5 oz (65.9 kg) IBW/kg (Calculated) : 70.7 Heparin Dosing Weight: 59.4 kg  Vital Signs: Temp: 99 F (37.2 C) (01/12 1255) Temp Source: Oral (01/12 1255) BP: 110/68 (01/12 1255) Pulse Rate: 117 (01/12 1255)  Labs: Recent Labs    01/06/18 2311 01/07/18 0351 01/07/18 0837 01/07/18 1949 01/08/18 0249 01/08/18 1224  HGB 7.9* 7.5*  --   --  5.9* 8.4*  HCT 23.9* 23.1*  --   --  18.5* 25.8*  PLT 115* 128*  --   --  102* 96*  HEPARINUNFRC 0.72* 0.55 0.16* <0.10* <0.10*  <0.10*  --   CREATININE 0.73 0.80  --   --  0.75  --     Estimated Creatinine Clearance: 93.8 mL/min (by C-G formula based on SCr of 0.75 mg/dL).   Medical History: Past Medical History:  Diagnosis Date  . Acute deep vein thrombosis (DVT) of right lower extremity ([place]) 02/18/2017  . 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 Microcytic anemia    /notes 01/11/2017    Assessment: 57 yo M with new DVT.  Pharmacy consulted to dose low dose heparin with no bolus after pt experienced a nosebleed with Eliquis.  LLE acute DVT diagnosed 1/3, CT scan showed extensive thrombosis in the IVC, common iliac, left iliac vein and femoral vein.  Pt has history of GI bleeding while on Xarelto and also reported a nosebleed while on Eliquis.  Last dose of Eliquis was 1/4 evening.  Heparin was discontinued this morning ~0500 due to concern for bleeding. Patient received multiple units of PRBC. CT ab showing no evidence of abdominopelvic hemorrhage. Hgb increased from 5.9 to 8.4 appropriately (platelts 102 to 96 today). Talked with MD and given H/H after transfusion plus CT findings will continue with heparin infusion in setting of thrombosis. Continue with low dose and no bolus as previous. Prior to discontinuation was receiving 950  units/hr due to undetectable heparin levels. No signs/symptoms of active bleed.  Goal of Therapy:  Heparin level 0.2 - 0.5 - low goal per MD request Monitor platelets by anticoagulation protocol: Yes   Plan:  -Restart heparin infusion at 950 units/hr at 1600 -Obtain 6 hour heparin level -Monitor CBC, HL daily -Monitor for signs/symptoms of bleeding  Doylene Canard, PharmD Clinical Pharmacist  Pager: [phone] Clinical Phone for 01/08/2018 until 3:30pm: [phone] If after 3:30pm, please call main pharmacy at [phone] 01/08/2018 3:51 PM

## 2018-01-08 NOTE — Progress Notes (Signed)
Patient HGB 5.9. Provider notified. Order received for blood transfusion.

## 2018-01-08 NOTE — Progress Notes (Signed)
ANTICOAGULATION CONSULT NOTE - Follow-Up Consult  Pharmacy Consult for heparin Indication: DVT  No Known Allergies  Patient Measurements: Height: 5\' 9"  (175.3 cm) Weight: 145 lb 4.5 oz (65.9 kg) IBW/kg (Calculated) : 70.7 Heparin Dosing Weight: 59.4 kg  Vital Signs: Temp: 99.1 F (37.3 C) (01/12 0426) Temp Source: Oral (01/12 0426) BP: 90/61 (01/12 0426) Pulse Rate: 116 (01/12 0420)  Labs: Recent Labs    01/06/18 2311 01/07/18 0351 01/07/18 0837 01/07/18 1949 01/08/18 0249  HGB 7.9* 7.5*  --   --  5.9*  HCT 23.9* 23.1*  --   --  18.5*  PLT 115* 128*  --   --  102*  HEPARINUNFRC 0.72* 0.55 0.16* <0.10* <0.10*  <0.10*  CREATININE 0.73 0.80  --   --  0.75    Estimated Creatinine Clearance: 93.8 mL/min (by C-G formula based on SCr of 0.75 mg/dL).   Medical History: Past Medical History:  Diagnosis Date  . Acute deep vein thrombosis (DVT) of right lower extremity ([place]) 02/18/2017  . 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 Microcytic anemia    /notes 01/11/2017    Assessment: 57 yo M with new DVT.  Pharmacy consulted to dose low dose heparin with no bolus after pt experienced a nosebleed with Eliquis.  LLE acute DVT diagnosed 1/3, CT scan showed extensive thrombosis in the IVC, common iliac, left iliac vein and femoral vein.  Pt has history of GI bleeding while on Xarelto and also reported a nosebleed while on Eliquis.  Last dose of Eliquis was 1/4 evening.   On 1/11: Pt continues on heparin in the interim.  MD request NO BOLUS and low goal. RN reported bleeding from multiple sites on, due to the necessity of heparin, decision was made to keep patient on heparin and monitor.   On 1/12: hgb and plts are now low, patient is actively receivng prbc transfusion HL is undetectable I spoke with the covering provider, decision was made to hold heparin with hopeful plan of resuming later today - TBD by day time med team Pt is now s/p  thrombectomy   Goal of Therapy:  Heparin level 0.2 - 0.5 - low goal per MD request Monitor platelets by anticoagulation protocol: Yes   Plan:  -Hold heparin  -F/u plan with day time team   Harvel Quale  01/08/2018 4:39 AM

## 2018-01-09 LAB — CBC
HCT: 27.5 % — ABNORMAL LOW (ref 39.0–52.0)
Hemoglobin: 8.8 g/dL — ABNORMAL LOW (ref 13.0–17.0)
MCH: 26.8 pg (ref 26.0–34.0)
MCHC: 32 g/dL (ref 30.0–36.0)
MCV: 83.8 fL (ref 78.0–100.0)
PLATELETS: 101 10*3/uL — AB (ref 150–400)
RBC: 3.28 MIL/uL — ABNORMAL LOW (ref 4.22–5.81)
RDW: 16.8 % — AB (ref 11.5–15.5)
WBC: 5.4 10*3/uL (ref 4.0–10.5)

## 2018-01-09 LAB — BLOOD CULTURE ID PANEL (REFLEXED)

## 2018-01-09 LAB — BPAM RBC
BLOOD PRODUCT EXPIRATION DATE: 201902022359
BLOOD PRODUCT EXPIRATION DATE: 201902042359
Blood Product Expiration Date: 201902032359
Blood Product Expiration Date: 201902042359
ISSUE DATE / TIME: 201901101224
ISSUE DATE / TIME: 201901120354
ISSUE DATE / TIME: 201901101224
ISSUE DATE / TIME: 201901120704
UNIT TYPE AND RH: 5100
UNIT TYPE AND RH: 5100
Unit Type and Rh: 5100
Unit Type and Rh: 5100

## 2018-01-09 LAB — TYPE AND SCREEN
ABO/RH(D): O POS
ANTIBODY SCREEN: NEGATIVE
UNIT DIVISION: 0
UNIT DIVISION: 0
UNIT DIVISION: 0
Unit division: 0

## 2018-01-09 LAB — MAGNESIUM: Magnesium: 0.8 mg/dL — CL (ref 1.7–2.4)

## 2018-01-09 LAB — HEPARIN LEVEL (UNFRACTIONATED): HEPARIN UNFRACTIONATED: 0.22 [IU]/mL — AB (ref 0.30–0.70)

## 2018-01-09 MED ORDER — ENOXAPARIN (LOVENOX) PATIENT EDUCATION KIT
PACK | Freq: Once | Status: AC
  Administered 2018-01-09: 20:00:00
  Filled 2018-01-09: qty 1

## 2018-01-09 MED ORDER — ENOXAPARIN SODIUM 60 MG/0.6ML ~~LOC~~ SOLN
60.0000 mg | Freq: Two times a day (BID) | SUBCUTANEOUS | Status: DC
  Administered 2018-01-09: 60 mg via SUBCUTANEOUS
  Filled 2018-01-09 (×2): qty 0.6

## 2018-01-09 MED ORDER — ENOXAPARIN SODIUM 60 MG/0.6ML ~~LOC~~ SOLN: 60.0000 mg | Freq: Two times a day (BID) | SUBCUTANEOUS | Status: DC

## 2018-01-09 MED ORDER — MAGNESIUM SULFATE 2 GM/50ML IV SOLN
2.0000 g | Freq: Once | INTRAVENOUS | Status: AC
  Administered 2018-01-09: 2 g via INTRAVENOUS

## 2018-01-09 MED ORDER — MAGNESIUM SULFATE 2 GM/50ML IV SOLN
INTRAVENOUS | Status: AC
  Filled 2018-01-09: qty 50

## 2018-01-09 NOTE — Progress Notes (Signed)
PHARMACY - PHYSICIAN COMMUNICATION CRITICAL VALUE ALERT - BLOOD CULTURE IDENTIFICATION (BCID)  Jeff [name] is an 57 y.o. male who presented to [facility] on 12/30/2017 with metastatic colon cancer and acute LLE DVT. Now with 1/2 blood cx with MRSE.  Name of physician (or Provider) Contacted: Baltazar Najjar  Current antibiotics: N/A  Changes to prescribed antibiotics recommended:  Recommendations accepted by provider  Results for orders placed or performed during the hospital encounter of 12/30/17  Blood Culture ID Panel (Reflexed) (Collected: 01/08/2018  2:56 AM)  Result Value Ref Range   Enterococcus species NOT DETECTED NOT DETECTED   Listeria monocytogenes NOT DETECTED NOT DETECTED   Staphylococcus species DETECTED (A) NOT DETECTED   Staphylococcus aureus NOT DETECTED NOT DETECTED   Methicillin resistance DETECTED (A) NOT DETECTED   Streptococcus species NOT DETECTED NOT DETECTED   Streptococcus agalactiae NOT DETECTED NOT DETECTED   Streptococcus pneumoniae NOT DETECTED NOT DETECTED   Streptococcus pyogenes NOT DETECTED NOT DETECTED   Acinetobacter baumannii NOT DETECTED NOT DETECTED   Enterobacteriaceae species NOT DETECTED NOT DETECTED   Enterobacter cloacae complex NOT DETECTED NOT DETECTED   Escherichia coli NOT DETECTED NOT DETECTED   Klebsiella oxytoca NOT DETECTED NOT DETECTED   Klebsiella pneumoniae NOT DETECTED NOT DETECTED   Proteus species NOT DETECTED NOT DETECTED   Serratia marcescens NOT DETECTED NOT DETECTED   Haemophilus influenzae NOT DETECTED NOT DETECTED   Neisseria meningitidis NOT DETECTED NOT DETECTED   Pseudomonas aeruginosa NOT DETECTED NOT DETECTED   Candida albicans NOT DETECTED NOT DETECTED   Candida glabrata NOT DETECTED NOT DETECTED   Candida krusei NOT DETECTED NOT DETECTED   Candida parapsilosis NOT DETECTED NOT DETECTED   Candida tropicalis NOT DETECTED NOT DETECTED    Harvel Quale 01/09/2018  3:29 AM
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
## 2018-01-09 NOTE — Progress Notes (Signed)
ANTICOAGULATION CONSULT NOTE  Pharmacy Consult for heparin Indication: DVT  No Known Allergies  Patient Measurements: Height: 5\' 9"  (175.3 cm) Weight: 135 lb 9.3 oz (61.5 kg) IBW/kg (Calculated) : 70.7 Heparin Dosing Weight: 59.4 kg  Vital Signs: Temp: 98.5 F (36.9 C) (01/13 0527) Temp Source: Oral (01/13 0527) BP: 115/76 (01/13 1235) Pulse Rate: 99 (01/13 0527)  Labs: Recent Labs    01/06/18 2311 01/07/18 0351  01/08/18 0249 01/08/18 1224 01/08/18 1457 01/08/18 2143 01/09/18 0624 01/09/18 1156  HGB 7.9* 7.5*  --  5.9* 8.4* 8.1*  --  8.8*  --   HCT 23.9* 23.1*  --  18.5* 25.8* 25.1*  --  27.5*  --   PLT 115* 128*  --  102* 96*  --   --  101*  --   HEPARINUNFRC 0.72* 0.55   < > <0.10*  <0.10*  --   --  <0.10*  --  0.22*  CREATININE 0.73 0.80  --  0.75  --   --   --   --   --    < > = values in this interval not displayed.   Estimated Creatinine Clearance: 87.6 mL/min (by C-G formula based on SCr of 0.75 mg/dL).  Assessment: 44 yoM presents with new LLE DVT. CT scan showed extensive thrombosis in the IVC, common iliac, left iliac vein, and femoral vein. Of note, patient has a history of GIB on Xarelto and nosebleed on Eliquis. Pharmacy consulted to dose heparin without bolus, also targeting a lower goal range.   Heparin discontinued 1/12 @ ~0500 due to concern for bleeding. Patient received multiple units of PRBC. CT ab showing no evidence of abdominopelvic hemorrhage. Hgb increased appropriately from 5.9 to 8.8. Spoke with MD and given H/H after transfusion plus CT findings, will continue with heparin infusion in setting of thrombosis.   Heparin level now within goal range at 0.22. No bleeding noted.  Goal of Therapy:  Heparin level 0.2 - 0.5 Monitor platelets by anticoagulation protocol: Yes  Plan:  -Continue heparin gtt at 1100 units/hr -Obtain confirmatory heparin level in 6 hours -Daily heparin level and CBC -Monitor for s/sx of bleeding  Yostin Malacara N. Gerarda Fraction,  PharmD PGY1 Pharmacy Resident Pager: [phone] 01/09/2018 12:52 PM

## 2018-01-09 NOTE — Progress Notes (Signed)
ANTICOAGULATION CONSULT NOTE  Pharmacy Consult for heparin Indication: DVT  No Known Allergies  Patient Measurements: Height: 5\' 9"  (175.3 cm) Weight: 145 lb 4.5 oz (65.9 kg) IBW/kg (Calculated) : 70.7 Heparin Dosing Weight: 59.4 kg  Vital Signs: Temp: 99.5 F (37.5 C) (01/12 2107) Temp Source: Oral (01/12 2107) BP: 107/80 (01/12 2107) Pulse Rate: 107 (01/12 2107)  Labs: Recent Labs    01/06/18 2311 01/07/18 0351  01/07/18 1949 01/08/18 0249 01/08/18 1224 01/08/18 1457 01/08/18 2143  HGB 7.9* 7.5*  --   --  5.9* 8.4* 8.1*  --   HCT 23.9* 23.1*  --   --  18.5* 25.8* 25.1*  --   PLT 115* 128*  --   --  102* 96*  --   --   HEPARINUNFRC 0.72* 0.55   < > <0.10* <0.10*  <0.10*  --   --  <0.10*  CREATININE 0.73 0.80  --   --  0.75  --   --   --    < > = values in this interval not displayed.    Estimated Creatinine Clearance: 93.8 mL/min (by C-G formula based on SCr of 0.75 mg/dL).   Medical History: Past Medical History:  Diagnosis Date  . Acute deep vein thrombosis (DVT) of right lower extremity ([place]) 02/18/2017  . Malignant neoplasm of abdomen ([place])    [name] 01/11/2017  . Microcytic anemia    /notes 01/11/2017    Assessment: 57 yo M with new DVT.  Pharmacy consulted to dose low dose heparin with no bolus after pt experienced a nosebleed with Eliquis.  LLE acute DVT diagnosed 1/3, CT scan showed extensive thrombosis in the IVC, common iliac, left iliac vein and femoral vein.  Pt has history of GI bleeding while on Xarelto and also reported a nosebleed while on Eliquis.  Last dose of Eliquis was 1/4 evening.  Heparin was discontinued this morning ~0500 due to concern for bleeding. Patient received multiple units of PRBC. CT ab showing no evidence of abdominopelvic hemorrhage. Hgb increased from 5.9 to 8.4 appropriately (platelts 102 to 96 today). Talked with MD and given H/H after transfusion plus CT findings will continue with heparin infusion in setting of  thrombosis. Continue with low dose and no bolus as previous.   Heparin level returned undetectable. I spoke with the RN, there have not been any problems with the infusion.   Goal of Therapy:  Heparin level 0.2 - 0.5 Monitor platelets by anticoagulation protocol: Yes    Plan:  -Increase heparin 1100 units/hr -Obtain 6 hour heparin level -Monitor CBC, HL daily -Monitor for signs/symptoms of bleeding   Harvel Quale  01/09/2018 12:17 AM

## 2018-01-09 NOTE — Progress Notes (Addendum)
ANTICOAGULATION CONSULT NOTE  Pharmacy Consult:  Transition from Heparin to Lovenox Indication: LLE DVT  No Known Allergies  Patient Measurements: Height: 5' 9"  (175.3 cm) Weight: 135 lb 9.3 oz (61.5 kg) IBW/kg (Calculated) : 70.7 Heparin Dosing Weight: 59.4 kg  Vital Signs: Temp: 98.5 F (36.9 C) (01/13 0527) Temp Source: Oral (01/13 0527) BP: 102/71 (01/13 1605) Pulse Rate: 99 (01/13 0527)  Labs: Recent Labs    01/06/18 2311 01/07/18 0351  01/08/18 0249 01/08/18 1224 01/08/18 1457 01/08/18 2143 01/09/18 0624 01/09/18 1156  HGB 7.9* 7.5*  --  5.9* 8.4* 8.1*  --  8.8*  --   HCT 23.9* 23.1*  --  18.5* 25.8* 25.1*  --  27.5*  --   PLT 115* 128*  --  102* 96*  --   --  101*  --   HEPARINUNFRC 0.72* 0.55   < > <0.10*  <0.10*  --   --  <0.10*  --  0.22*  CREATININE 0.73 0.80  --  0.75  --   --   --   --   --    < > = values in this interval not displayed.   Estimated Creatinine Clearance: 87.6 mL/min (by C-G formula based on SCr of 0.75 mg/dL).  Assessment: 38 yoM presents with new LLE DVT. CT scan showed extensive thrombosis in the IVC, common iliac, left iliac vein, and femoral vein. Of note, patient has a history of GIB on Xarelto and nosebleed on Eliquis. Pharmacy initially consulted to dose heparin without bolus started on 01/01/18 ,  targeting a lower goal range.  Acute post thrombectomy POD#3 by IR. Heparin resumed then Heparin discontinued 1/12 @ ~0500 due to concern for bleeding.  Patient received multiple units of PRBC. CT ab showing no evidence of abdominopelvic hemorrhage. Hgb increased appropriately from 5.9 to 8.8., pltc 101k.  Heparin level within goal range at 0.22 today. No bleeding noted.  Now pharmacy consulted to transition from IV heparin drip to Lovenox for LLE DVT, in preparation for discharge. Weight 61.5 kg , SCr 0.75,CrCl 87 ml/min No reported overt bleeding.   Goal of Therapy:  antiXa level 0.6-1 unit/ml (4hours after lovenox dose  given) Monitor platelets by anticoagulation protocol: Yes  Plan:  -Discontinue heparin gtt at 8PM and 1hour later start Lovenox 53m SQ q12h  After heparin stopped. RN to educate patient how to self administer lovenox Lovenox discharge kit ordered. -CBC q72h  -Monitor for s/sx of bleeding   RNicole Cella RPh Clinical Pharmacist Pager: [phone]/13/2019 4:38 PM

## 2018-01-09 NOTE — Plan of Care (Signed)
Pt instructed on how to use the call light and his phone with numbers of RN/NT to call for assistance. Pt informed that the bed alarm would be on throughout the night and he was okay with that. Pain issues dealt with by giving his pain meds, will continue to monitor, VSS with temps slightly elevated, will continue to monitor. Pulses palpable and his sites are all C/D/I with no s/s of complications, skin warm and dry with palpable pulses throughout.

## 2018-01-09 NOTE — Progress Notes (Signed)
PROGRESS NOTE  Jeff [name] HYI:[id number] [name]: Dec 22, 1961 [name]: 12/30/2017 PCP: [name], MD  HPI/Recap of past 24 hours: Jeff [name] is a 57 y.o. 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 Iimaging studies notable for worsening left LE DVT. Initially hesitant to start anticoagulation due to known history of GI bleed while on Xarelto. Started on Eliquis and transferred to [facility] for thrombectomy. Post op #2. Transferred to ICU for vent management. Extubated today 01/07/18 and breathing well on ambient air.  Pt seen and examined with no family member at his bedside. No reported overt bleeding. Will start lovenox with education for discharge planning. Ambulate with assist as tolerated. PT re-ordered.   Assessment/Plan: Principal Problem:   Evaluation by psychiatric service required Active Problems:   DVT (deep venous thrombosis) (HCC)   Malignant neoplasm of colon ([place])   Palliative care by specialist   Advance care planning   DVT, bilateral lower limbs ([place])  Left lowerextremity DVT, acute post thrombectomy POD#3 by IR -start lovenox injection with education for discharge planning -Transferred from [facility] to [facility] on 01/05/18 -CT scan showed extensive thrombosis in the IVC, common iliac, left iliac vein and femoral vein -prior history of GI bleeding from his left colon mass when he was onmaintenance Xarelto,he is at high risk for recurrent bleeding from anticoagulation,  -MRI brain without metastatic disease -Pt agrees to continueanticoagulation after procedure, will need Lovenox  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 -on palliative chemotherapy 5-FU and Panitumumab -last chemo session 11/27/2017, follows with Dr Burr Medico  Acute on chronic chronic  anemia of chronic disease -anemia in the setting of underlying colon cancer & secondary to chemotherapy, high risk for bleeding and further blood loss and on anticoagulation watch closely and transfuse as clinically indicated -No evidence of bleeding -CBC am  Moderate Protein and calorie malnutrition with Anorexia -supplements advised dietitian consult requested, c/n Remeron for appetite stimulation  Debilitating -PT to evaluate and treat -ambulate with assist as tolerated   Code Status: full  Family Communication: no family members at bedside   Disposition Plan: will stay another midnight to continue treatment for DVT and close monitoring of drop in hg or bleeding   Consultants:  IR   CCM for vent management while pt was intubated   Pharmacy for lovenox management  Procedures:  Intubation 01/06/18  thombectomy 01/06/18  Antimicrobials:  None  DVT prophylaxis: lovenox injection   Objective: Vitals:   01/08/18 2107 01/09/18 0005 01/09/18 0405 01/09/18 0527  BP: 107/80 103/67 118/83 (!) 127/94  Pulse: (!) 107   99  Resp: 17   20  Temp: 99.5 F (37.5 C)   98.5 F (36.9 C)  TempSrc: Oral   Oral  SpO2: 99% 99% 100% 100%  Weight:    61.5 kg (135 lb 9.3 oz)  Height:        Intake/Output Summary (Last 24 hours) at 01/09/2018 0846 Last data filed at 01/09/2018 0600 Gross per 24 hour  Intake 953 ml  Output 2250 ml  Net -1297 ml   Filed Weights   01/07/18 2200 01/08/18 0426 01/09/18 0527  Weight: 63.3 kg (139 lb 8.8 oz) 65.9 kg (145 lb 4.5 oz) 61.5 kg (135 lb 9.3 oz)    Exam:   General:  57 yo AAM WD WN  NAD A&o x3  Cardiovascular: RRR no rubs or gallops   Respiratory: CTA no wheezes or rales   Abdomen: colonic pouch on right abd quadrant NBS x4 NT ND  Musculoskeletal: non focal, Trace edema in RLE and 1+ pitting edema in LLE  Skin: no open sores  Psychiatry: mood is appropriate for condition and setting   Data Reviewed: CBC: Recent Labs  Lab  01/06/18 2311 01/07/18 0351 01/08/18 0249 01/08/18 1224 01/08/18 1457 01/09/18 0624  WBC 5.0 6.7 6.0 6.3  --  5.4  NEUTROABS 3.7  --  3.5  --   --   --   HGB 7.9* 7.5* 5.9* 8.4* 8.1* 8.8*  HCT 23.9* 23.1* 18.5* 25.8* 25.1* 27.5*  MCV 82.7 82.5 84.5 84.0  --  83.8  PLT 115* 128* 102* 96*  --  144*   Basic Metabolic Panel: Recent Labs  Lab 01/06/18 1503 01/06/18 2311 01/07/18 0351 01/08/18 0249 01/09/18 0624  NA 138 134* 136 138  --   K 3.8 4.4 4.5 3.7  --   CL  --  102 104 106  --   CO2  --  23 24 27   --   GLUCOSE 111* 151* 136* 114*  --   BUN  --  9 11 11   --   CREATININE  --  0.73 0.80 0.75  --   CALCIUM  --  7.5* 7.7* 7.3*  --   MG  --  0.9*  --  1.0* 0.8*  PHOS  --  3.7  --  2.2*  --    GFR: Estimated Creatinine Clearance: 87.6 mL/min (by C-G formula based on SCr of 0.75 mg/dL). Liver Function Tests: No results for input(s): AST, ALT, ALKPHOS, BILITOT, PROT, ALBUMIN in the last 168 hours. No results for input(s): LIPASE, AMYLASE in the last 168 hours. No results for input(s): AMMONIA in the last 168 hours. Coagulation Profile: No results for input(s): INR, PROTIME in the last 168 hours. Cardiac Enzymes: No results for input(s): CKTOTAL, CKMB, CKMBINDEX, TROPONINI in the last 168 hours. BNP (last 3 results) No results for input(s): PROBNP in the last 8760 hours. HbA1C: No results for input(s): HGBA1C in the last 72 hours. CBG: Recent Labs  Lab 01/06/18 2205  GLUCAP 138*   Lipid Profile: Recent Labs    01/06/18 2311  TRIG 84   Thyroid Function Tests: No results for input(s): TSH, T4TOTAL, FREET4, T3FREE, THYROIDAB in the last 72 hours. Anemia Panel: No results for input(s): VITAMINB12, FOLATE, FERRITIN, TIBC, IRON, RETICCTPCT in the last 72 hours. Urine analysis:    Component Value Date/Time   COLORURINE YELLOW 01/08/2018 0247   APPEARANCEUR CLEAR 01/08/2018 0247   LABSPEC 1.017 01/08/2018 0247   PHURINE 7.0 01/08/2018 0247   GLUCOSEU NEGATIVE  01/08/2018 0247   HGBUR MODERATE (A) 01/08/2018 0247   BILIRUBINUR NEGATIVE 01/08/2018 0247   KETONESUR NEGATIVE 01/08/2018 0247   PROTEINUR NEGATIVE 01/08/2018 0247   NITRITE NEGATIVE 01/08/2018 0247   LEUKOCYTESUR NEGATIVE 01/08/2018 0247   Sepsis Labs: @LABRCNTIP (procalcitonin:4,lacticidven:4)  ) Recent Results (from the past 240 hour(s))  Urine culture     Status: None   Collection Time: 12/30/17  8:50 PM  Result Value Ref Range Status   Specimen Description URINE, CLEAN CATCH  Final   Special Requests NONE  Final   Culture   Final    NO GROWTH Performed at [facility], [place] [address] [place], [place] [address]    Report Status 01/01/2018 FINAL  Final  Surgical pcr screen     Status: Abnormal   Collection Time: 01/05/18  6:15 PM  Result Value Ref Range Status   MRSA, PCR NEGATIVE NEGATIVE Final   Staphylococcus aureus POSITIVE (A) NEGATIVE Final    Comment: (NOTE) The Xpert SA Assay (FDA approved for NASAL specimens in patients 87 years of age and older), is one component of a comprehensive surveillance program. It is not intended to diagnose infection nor to guide or monitor treatment.   Culture, blood (routine x 2)     Status: None (Preliminary result)   Collection Time: 01/08/18  2:45 AM  Result Value Ref Range Status   Specimen Description BLOOD RIGHT ARM  Final   Special Requests IN PEDIATRIC BOTTLE Blood Culture adequate volume  Final   Culture  Setup Time   Final    GRAM POSITIVE COCCI IN CLUSTERS IN PEDIATRIC BOTTLE CRITICAL VALUE NOTED.  VALUE IS CONSISTENT WITH PREVIOUSLY REPORTED AND CALLED VALUE.    Culture GRAM POSITIVE COCCI  Final   Report Status PENDING  Incomplete  Culture, blood (routine x 2)     Status: None (Preliminary result)   Collection Time: 01/08/18  2:56 AM  Result Value Ref Range Status   Specimen Description BLOOD RIGHT ARM  Final   Special Requests IN PEDIATRIC BOTTLE Blood Culture adequate volume  Final   Culture  Setup  Time   Final    GRAM POSITIVE COCCI IN CLUSTERS IN PEDIATRIC BOTTLE CRITICAL RESULT CALLED TO, READ BACK BY AND VERIFIED WITH: A. MASTERS,PHARMD [id number] 01/09/2018 T. Rosalia Hammers    Culture GRAM POSITIVE COCCI  Final   Report Status PENDING  Incomplete  Blood Culture ID Panel (Reflexed)     Status: Abnormal   Collection Time: 01/08/18  2:56 AM  Result Value Ref Range Status   Enterococcus species NOT DETECTED NOT DETECTED Final   Listeria monocytogenes NOT DETECTED NOT DETECTED Final   Staphylococcus species DETECTED (A) NOT DETECTED Final    Comment: Methicillin (oxacillin) resistant coagulase negative staphylococcus. Possible blood culture contaminant (unless isolated from more than one blood culture draw or clinical case suggests pathogenicity). No antibiotic treatment is indicated for blood  culture contaminants. CRITICAL RESULT CALLED TO, READ BACK BY AND VERIFIED WITH: A. MASTERS,PHARMD [id number] 01/09/2018 T. TYSOR    Staphylococcus aureus NOT DETECTED NOT DETECTED Final   Methicillin resistance DETECTED (A) NOT DETECTED Final    Comment: CRITICAL RESULT CALLED TO, READ BACK BY AND VERIFIED WITH: A. MASTERS,PHARMD [id number] 01/09/2018 T. TYSOR    Streptococcus species NOT DETECTED NOT DETECTED Final   Streptococcus agalactiae NOT DETECTED NOT DETECTED Final   Streptococcus pneumoniae NOT DETECTED NOT DETECTED Final   Streptococcus pyogenes NOT DETECTED NOT DETECTED Final   Acinetobacter baumannii NOT DETECTED NOT DETECTED Final   Enterobacteriaceae species NOT DETECTED NOT DETECTED Final   Enterobacter cloacae complex NOT DETECTED NOT DETECTED Final   Escherichia coli NOT DETECTED NOT DETECTED Final   Klebsiella oxytoca NOT DETECTED NOT DETECTED Final   Klebsiella pneumoniae NOT DETECTED NOT DETECTED Final   Proteus species NOT DETECTED NOT DETECTED Final   Serratia marcescens NOT DETECTED NOT DETECTED Final   Haemophilus influenzae NOT DETECTED NOT DETECTED Final   Neisseria meningitidis NOT  DETECTED NOT DETECTED Final   Pseudomonas aeruginosa NOT DETECTED NOT DETECTED Final   Candida albicans NOT DETECTED NOT DETECTED Final   Candida glabrata NOT DETECTED NOT DETECTED Final   Candida krusei NOT DETECTED NOT DETECTED Final   Candida parapsilosis NOT  DETECTED NOT DETECTED Final   Candida tropicalis NOT DETECTED NOT DETECTED Final      Studies: Ct Abdomen Pelvis Wo Contrast  Result Date: 01/08/2018 CLINICAL DATA:  Hematuria. Rule out intra-abdominal bleed. Metastatic colon cancer. DVT, recent thrombectomy. EXAM: CT ABDOMEN AND PELVIS WITHOUT CONTRAST TECHNIQUE: Multidetector CT imaging of the abdomen and pelvis was performed following the standard protocol without IV contrast. COMPARISON:  Procedure note of 01/06/2018. Contrast-enhanced CT of 12/30/2017. FINDINGS: Lower chest: Bibasilar atelectasis. Normal heart size with trace bilateral pleural effusion. Hepatobiliary: Grossly normal liver, gallbladder, biliary tract. Pancreas: Normal noncontrast appearance of the pancreas. Spleen: Soft tissue fullness within the splenic hilum, corresponding to the mass detailed on the prior contrast-enhanced exam. Grossly similar, including at 5.2 x 4.4 cm today. Adrenals/Urinary Tract: Normal adrenal glands. No renal calculi or hydronephrosis. Foley catheter within the urinary bladder. Stomach/Bowel: Normal stomach, without wall thickening. Double-barrel transverse colostomy. Normal caliber small bowel loops. Vascular/Lymphatic: Aortic and branch vessel atherosclerosis. Limited evaluation for abdominal adenopathy. No pelvic sidewall adenopathy. Left common and external iliac venous stent. Reproductive: Normal prostate. Other: No free intraperitoneal air. Diffuse anasarca. Mesenteric edema, without significant fluid. No evidence of intraperitoneal hemorrhage. Musculoskeletal: Right proximal femoral fixation. Remote right rib trauma. IMPRESSION: 1. Limited exam, without oral or IV contrast. 2. No evidence of  abdominopelvic hemorrhage. 3. Small bilateral pleural effusions. 4. Grossly similar mass within the splenic hilum. 5.  Aortic Atherosclerosis (ICD10-I70.0). Electronically Signed   By: Abigail Miyamoto M.D.   On: 01/08/2018 13:12    Scheduled Meds: . Chlorhexidine Gluconate Cloth  6 each Topical Daily  . docusate sodium  100 mg Oral BID  . feeding supplement (ENSURE ENLIVE)  120 mL Oral TID  . gabapentin  100 mg Oral Q8H  . mirtazapine  15 mg Per Tube QHS  . multivitamin with minerals  1 tablet Oral Daily  . mupirocin ointment  1 application Nasal BID  . pantoprazole  40 mg Oral BID    Continuous Infusions: . heparin 1,100 Units/hr (01/09/18 0011)  . magnesium sulfate    . magnesium sulfate 1 - 4 g bolus IVPB 2 g (01/09/18 0806)     LOS: 8 days     Kayleen Memos, MD [facility] Pager [phone]  If 7PM-7AM, please contact night-coverage [email or web address] Password [email or web address] 01/09/2018, 8:46 AM

## 2018-01-10 ENCOUNTER — Ambulatory Visit: Payer: Medicaid Other

## 2018-01-10 ENCOUNTER — Other Ambulatory Visit: Payer: Medicaid Other

## 2018-01-10 ENCOUNTER — Inpatient Hospital Stay: Payer: Medicaid Other | Admitting: Nutrition

## 2018-01-10 ENCOUNTER — Ambulatory Visit: Payer: Medicaid Other | Admitting: Nurse Practitioner

## 2018-01-10 DIAGNOSIS — I824Z9 Acute embolism and thrombosis of unspecified deep veins of unspecified distal lower extremity: Secondary | ICD-10-CM

## 2018-01-10 LAB — CBC
HEMATOCRIT: 27.5 % — AB (ref 39.0–52.0)
HEMOGLOBIN: 8.7 g/dL — AB (ref 13.0–17.0)
MCH: 26.9 pg (ref 26.0–34.0)
MCHC: 31.6 g/dL (ref 30.0–36.0)
MCV: 85.1 fL (ref 78.0–100.0)
Platelets: 117 10*3/uL — ABNORMAL LOW (ref 150–400)
RBC: 3.23 MIL/uL — AB (ref 4.22–5.81)
RDW: 16.7 % — ABNORMAL HIGH (ref 11.5–15.5)
WBC: 5.3 10*3/uL (ref 4.0–10.5)

## 2018-01-10 LAB — MAGNESIUM: MAGNESIUM: 1.1 mg/dL — AB (ref 1.7–2.4)

## 2018-01-10 MED ORDER — RIVAROXABAN 15 MG PO TABS
15.0000 mg | ORAL_TABLET | Freq: Two times a day (BID) | ORAL | Status: DC
  Administered 2018-01-10 – 2018-01-11 (×2): 15 mg via ORAL
  Filled 2018-01-10 (×2): qty 1

## 2018-01-10 MED ORDER — MAGNESIUM SULFATE 2 GM/50ML IV SOLN
2.0000 g | Freq: Once | INTRAVENOUS | Status: AC
  Administered 2018-01-10: 2 g via INTRAVENOUS
  Filled 2018-01-10: qty 50

## 2018-01-10 NOTE — Evaluation (Signed)
Physical Therapy Evaluation Patient Details Name: Jeff [name] MRN: [id number] [name]: 06/21/1961 Today's Date: 01/10/2018   History of Present Illness  Pt is a 57 yo with metastatic descending colon cancer to peritoneum, on palliative chemotherapy, right lower extremity DVT on Feb 2018 s/p IVC filter  In Aug 2018 due to GIB while on xarelto. Admitted 12/31/17 for worsening LE DVT . CT scan had shown extensive thrombosis in the IVC, common iliac, left iliac vein, and femoral vein. MRI brain does not show any brain mets. He underwent thrombectomy 1/10, extubated 1/11.   Clinical Impression  Pt admitted with above diagnosis. Pt currently with functional limitations due to the deficits listed below (see PT Problem List). At the time of PT eval pt was able to perform transfers with modified independence and ambulation/stair training with up to min assist for balance support and safety. Pt with slip on bedroom shoes which pt was stepping out of on the L when attempting to ascend stairs. Pt declines HHPT but feel he would benefit if pt changes his mind. Acutely, pt will benefit from skilled PT to increase their independence and safety with mobility to allow discharge to the venue listed below.       Follow Up Recommendations Home health PT(pt refusing)    Equipment Recommendations  None recommended by PT    Recommendations for Other Services       Precautions / Restrictions Precautions Precautions: Fall Restrictions Weight Bearing Restrictions: No      Mobility  Bed Mobility Overal bed mobility: Modified Independent             General bed mobility comments: HOB slightly elevated. Pt was able to transition to EOB without difficulty.   Transfers Overall transfer level: Modified independent Equipment used: None Transfers: Sit to/from Stand           General transfer comment: Pt demonstrated proper hand placement on seated surface for safety. Required some momentum to stand from  low bed height, however no unsteadiness noted and no assist required.   Ambulation/Gait Ambulation/Gait assistance: Supervision Ambulation Distance (Feet): 500 Feet Assistive device: None Gait Pattern/deviations: Step-through pattern;Decreased stride length;Trunk flexed   Gait velocity interpretation: Below normal speed for age/gender General Gait Details: Pt guarded but overall steady with ambulation. No AD, no unsteadiness or LOB noted.   Stairs Stairs: Yes Stairs assistance: Min assist Stair Management: One rail Left;Alternating pattern;Forwards Number of Stairs: 5 General stair comments: 1 LOB as bedroom shoe slipped off L foot as he was stepping up. Assist provided to recover and pt was able to complete the rest of the stairs without difficulty. Overall slow and guarded.   Wheelchair Mobility    Modified Rankin (Stroke Patients Only)       Balance Overall balance assessment: Needs assistance Sitting-balance support: Feet supported;No upper extremity supported Sitting balance-Leahy Scale: Good     Standing balance support: No upper extremity supported;During functional activity Standing balance-Leahy Scale: Fair                               Pertinent Vitals/Pain Pain Assessment: Faces Faces Pain Scale: Hurts a little bit Pain Location: General discomfort with ambulation Pain Descriptors / Indicators: Discomfort Pain Intervention(s): Limited activity within patient's tolerance;Monitored during session;Repositioned    Home Living Family/patient expects to be discharged to:: Private residence Living Arrangements: Alone Available Help at Discharge: Family;Available PRN/intermittently Type of Home: Apartment Home Access: Stairs to enter  Entrance Stairs-Rails: Right Entrance Stairs-Number of Steps: 4 Home Layout: One level Home Equipment: Grab bars - tub/shower      Prior Function Level of Independence: Independent               Hand  Dominance   Dominant Hand: Right    Extremity/Trunk Assessment   Upper Extremity Assessment Upper Extremity Assessment: Overall WFL for tasks assessed    Lower Extremity Assessment Lower Extremity Assessment: Generalized weakness    Cervical / Trunk Assessment Cervical / Trunk Assessment: Normal  Communication   Communication: No difficulties  Cognition Arousal/Alertness: Awake/alert Behavior During Therapy: Flat affect Overall Cognitive Status: Within Functional Limits for tasks assessed                                        General Comments      Exercises     Assessment/Plan    PT Assessment Patient needs continued PT services  PT Problem List Decreased strength;Decreased activity tolerance;Decreased balance;Decreased mobility;Decreased safety awareness       PT Treatment Interventions DME instruction;Gait training;Stair training;Functional mobility training;Therapeutic activities;Therapeutic exercise;Neuromuscular re-education;Patient/family education    PT Goals (Current goals can be found in the Care Plan section)  Acute Rehab PT Goals Patient Stated Goal: Home today; this morning PT Goal Formulation: With patient Time For Goal Achievement: 01/24/18 Potential to Achieve Goals: Good    Frequency Min 3X/week   Barriers to discharge        Co-evaluation               AM-PAC PT "6 Clicks" Daily Activity  Outcome Measure Difficulty turning over in bed (including adjusting bedclothes, sheets and blankets)?: None Difficulty moving from lying on back to sitting on the side of the bed? : None Difficulty sitting down on and standing up from a chair with arms (e.g., wheelchair, bedside commode, etc,.)?: None Help needed moving to and from a bed to chair (including a wheelchair)?: A Little Help needed walking in hospital room?: A Little Help needed climbing 3-5 steps with a railing? : A Little 6 Click Score: 21    End of Session  Equipment Utilized During Treatment: Gait belt Activity Tolerance: Patient tolerated treatment well Patient left: in bed;with call bell/phone within reach;with nursing/sitter in room(Sitting EOB) Nurse Communication: Mobility status PT Visit Diagnosis: Unsteadiness on feet (R26.81);Difficulty in walking, not elsewhere classified (R26.2)    Time: 4562-5638 PT Time Calculation (min) (ACUTE ONLY): 24 min   Charges:   PT Evaluation $PT Eval Moderate Complexity: 1 Mod PT Treatments $Gait Training: 8-22 mins   PT G Codes:        Rolinda Roan, PT, DPT Acute Rehabilitation Services Pager: [phone]   Thelma Comp 01/10/2018, 12:39 PM

## 2018-01-10 NOTE — Progress Notes (Signed)
Pt is refusing to take lovenox, states that it is too painful. Spoke with pharmacist to see if there would be oral alternative that would not interfere with oral chemo agent. Will notify Dr. Nevada Crane. Have made Jannifer Franklin PA aware. Cont to monitor. Carroll Kinds RN

## 2018-01-10 NOTE — Progress Notes (Signed)
PROGRESS NOTE  Jeff [name] ZDG:[id number] [name]: [birth year] [name]: 12/30/2017 PCP: [name], MD  HPI/Recap of past 24 hours: Jeff [name] is a 57 y.o. 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 Iimaging studies notable for worsening left LE DVT. Initially hesitant to start anticoagulation due to known history of GI bleed while on Xarelto. Started on Eliquis and transferred to [facility] for thrombectomy. Post op #3. Transferred to ICU for vent management. Extubated 01/07/18 and breathing well on ambient air.  Pt seen and examined with no family member at his bedside. No reported overt bleeding. Will start xarelto. Patient refuses lovenox injection. Pharmacy consulted to manage anticoagulation. Severe hypomagnesemia despite repletion. Will continue to replete and repeat magnesium level in the am. Possible discharge tomorrow 01/11/18 if no acute events overnight.  Assessment/Plan: Principal Problem:   Evaluation by psychiatric service required Active Problems:   DVT (deep venous thrombosis) (HCC)   Malignant neoplasm of colon ([place])   Palliative care by specialist   Advance care planning   DVT, bilateral lower limbs ([place])  Left lowerextremity DVT, acute post thrombectomy POD#4 by IR -start xarelto today 01/10/18 will need to watch for drop in hg/bleeding -education for discharge planning -Transferred from [facility] hospital to [facility] on 01/05/18 -CT scan showed extensive thrombosis in the IVC, common iliac, left iliac vein and femoral vein -prior history of GI bleeding from his left colon mass when he was onmaintenance Xarelto,he is at high risk for recurrent bleeding from anticoagulation,  -MRI brain without metastatic disease -Pt agrees to continueanticoagulation after procedure -patient declines  lovenox injection, agrees to po anticoagulation with xarelto  Severe hypomagnesemia -mg2+ 1.0 from 0.8 -2gm IV mag2+(01/10/18) -repeat Mg2+ level am, BMP am  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 -on palliative chemotherapy 5-FU and Panitumumab -last chemo session 11/27/2017, follows with Dr Burr Medico  Acute on chronic chronic anemia of chronic disease -anemia in the setting of underlying colon cancer & secondary to chemotherapy -high risk for bleeding and further blood loss -on anticoagulation watch closely and transfuse as clinically indicated -No evidence of bleeding -CBC am  Moderate Protein and calorie malnutrition with Anorexia -c/w oral supplements -c/w Remeron for appetite stimulation  Debilitating -PT to evaluate and treat -ambulate with assist as tolerated -PT recommends HHPT   Code Status: full  Family Communication: no family members at bedside   Disposition Plan: will stay another midnight for magnesium replacement in the setting of severe hypomagnesemia; started on new medication xarelto, and close monitoring of hg or bleeding.   Consultants:  IR   CCM for vent management while pt was intubated   Pharmacy for lovenox management  Procedures:  Intubation 01/06/18  thombectomy 01/06/18  Antimicrobials:  None  DVT prophylaxis: xarelto 01/10/18   Objective: Vitals:   01/09/18 2357 01/10/18 0432 01/10/18 0811 01/10/18 1421  BP: 105/72 106/80 112/77 109/86  Pulse:   (!) 101 85  Resp: 17 17 18 20   Temp: 98.5 F (36.9 C) 99.3 F (37.4 C) 98.3 F (36.8 C) 98.1 F (36.7 C)  TempSrc: Oral Oral Oral Oral  SpO2: 99% 100% 99%   Weight:  59.9 kg (132 lb)    Height:        Intake/Output Summary (Last 24 hours) at 01/10/2018 1542 Last data filed at 01/10/2018 1313 Gross  per 24 hour  Intake 801 ml  Output 2300 ml  Net -1499 ml   Filed Weights   01/08/18 0426 01/09/18 0527 01/10/18 0432  Weight: 65.9 kg (145 lb 4.5 oz) 61.5 kg  (135 lb 9.3 oz) 59.9 kg (132 lb)    Exam:   General:  57 yo AAM WD WN NAD A&o x3  Cardiovascular: RRR no rubs or gallops   Respiratory: CTA no wheezes or rales   Abdomen: colonic pouch on right abd quadrant NBS x4 NT ND  Musculoskeletal: non focal, Trace edema in RLE and 1+ pitting edema in LLE  Skin: no open sores  Psychiatry: mood is appropriate for condition and setting   Data Reviewed: CBC: Recent Labs  Lab 01/06/18 2311 01/07/18 0351 01/08/18 0249 01/08/18 1224 01/08/18 1457 01/09/18 0624 01/10/18 0653  WBC 5.0 6.7 6.0 6.3  --  5.4 5.3  NEUTROABS 3.7  --  3.5  --   --   --   --   HGB 7.9* 7.5* 5.9* 8.4* 8.1* 8.8* 8.7*  HCT 23.9* 23.1* 18.5* 25.8* 25.1* 27.5* 27.5*  MCV 82.7 82.5 84.5 84.0  --  83.8 85.1  PLT 115* 128* 102* 96*  --  101* 939*   Basic Metabolic Panel: Recent Labs  Lab 01/06/18 1503 01/06/18 2311 01/07/18 0351 01/08/18 0249 01/09/18 0624 01/10/18 0653  NA 138 134* 136 138  --   --   K 3.8 4.4 4.5 3.7  --   --   CL  --  102 104 106  --   --   CO2  --  23 24 27   --   --   GLUCOSE 111* 151* 136* 114*  --   --   BUN  --  9 11 11   --   --   CREATININE  --  0.73 0.80 0.75  --   --   CALCIUM  --  7.5* 7.7* 7.3*  --   --   MG  --  0.9*  --  1.0* 0.8* 1.1*  PHOS  --  3.7  --  2.2*  --   --    GFR: Estimated Creatinine Clearance: 85.3 mL/min (by C-G formula based on SCr of 0.75 mg/dL). Liver Function Tests: No results for input(s): AST, ALT, ALKPHOS, BILITOT, PROT, ALBUMIN in the last 168 hours. No results for input(s): LIPASE, AMYLASE in the last 168 hours. No results for input(s): AMMONIA in the last 168 hours. Coagulation Profile: No results for input(s): INR, PROTIME in the last 168 hours. Cardiac Enzymes: No results for input(s): CKTOTAL, CKMB, CKMBINDEX, TROPONINI in the last 168 hours. BNP (last 3 results) No results for input(s): PROBNP in the last 8760 hours. HbA1C: No results for input(s): HGBA1C in the last 72  hours. CBG: Recent Labs  Lab 01/06/18 2205  GLUCAP 138*   Lipid Profile: No results for input(s): CHOL, HDL, LDLCALC, TRIG, CHOLHDL, LDLDIRECT in the last 72 hours. Thyroid Function Tests: No results for input(s): TSH, T4TOTAL, FREET4, T3FREE, THYROIDAB in the last 72 hours. Anemia Panel: No results for input(s): VITAMINB12, FOLATE, FERRITIN, TIBC, IRON, RETICCTPCT in the last 72 hours. Urine analysis:    Component Value Date/Time   COLORURINE YELLOW 01/08/2018 [place] 01/08/2018 0247   LABSPEC 1.017 01/08/2018 0247   PHURINE 7.0 01/08/2018 0247   GLUCOSEU NEGATIVE 01/08/2018 0247   HGBUR MODERATE (A) 01/08/2018 0247   BILIRUBINUR NEGATIVE 01/08/2018 0247   KETONESUR NEGATIVE 01/08/2018 0247   PROTEINUR NEGATIVE  01/08/2018 0247   NITRITE NEGATIVE 01/08/2018 0247   LEUKOCYTESUR NEGATIVE 01/08/2018 0247   Sepsis Labs: @LABRCNTIP (procalcitonin:4,lacticidven:4)  ) Recent Results (from the past 240 hour(s))  Surgical pcr screen     Status: Abnormal   Collection Time: 01/05/18  6:15 PM  Result Value Ref Range Status   MRSA, PCR NEGATIVE NEGATIVE Final   Staphylococcus aureus POSITIVE (A) NEGATIVE Final    Comment: (NOTE) The Xpert SA Assay (FDA approved for NASAL specimens in patients 74 years of age and older), is one component of a comprehensive surveillance program. It is not intended to diagnose infection nor to guide or monitor treatment.   Culture, blood (routine x 2)     Status: Abnormal (Preliminary result)   Collection Time: 01/08/18  2:45 AM  Result Value Ref Range Status   Specimen Description BLOOD RIGHT ARM  Final   Special Requests IN PEDIATRIC BOTTLE Blood Culture adequate volume  Final   Culture  Setup Time   Final    GRAM POSITIVE COCCI IN CLUSTERS IN PEDIATRIC BOTTLE CRITICAL VALUE NOTED.  VALUE IS CONSISTENT WITH PREVIOUSLY REPORTED AND CALLED VALUE.    Culture (A)  Final    STAPHYLOCOCCUS SPECIES (COAGULASE  NEGATIVE) SUSCEPTIBILITIES TO FOLLOW    Report Status PENDING  Incomplete  Culture, blood (routine x 2)     Status: Abnormal (Preliminary result)   Collection Time: 01/08/18  2:56 AM  Result Value Ref Range Status   Specimen Description BLOOD RIGHT ARM  Final   Special Requests IN PEDIATRIC BOTTLE Blood Culture adequate volume  Final   Culture  Setup Time   Final    GRAM POSITIVE COCCI IN CLUSTERS IN PEDIATRIC BOTTLE CRITICAL RESULT CALLED TO, READ BACK BY AND VERIFIED WITH: A. MASTERS,PHARMD [id number] 01/09/2018 T. TYSOR    Culture STAPHYLOCOCCUS SPECIES (COAGULASE NEGATIVE) (A)  Final   Report Status PENDING  Incomplete  Blood Culture ID Panel (Reflexed)     Status: Abnormal   Collection Time: 01/08/18  2:56 AM  Result Value Ref Range Status   Enterococcus species NOT DETECTED NOT DETECTED Final   Listeria monocytogenes NOT DETECTED NOT DETECTED Final   Staphylococcus species DETECTED (A) NOT DETECTED Final    Comment: Methicillin (oxacillin) resistant coagulase negative staphylococcus. Possible blood culture contaminant (unless isolated from more than one blood culture draw or clinical case suggests pathogenicity). No antibiotic treatment is indicated for blood  culture contaminants. CRITICAL RESULT CALLED TO, READ BACK BY AND VERIFIED WITH: A. MASTERS,PHARMD [id number] 01/09/2018 T. TYSOR    Staphylococcus aureus NOT DETECTED NOT DETECTED Final   Methicillin resistance DETECTED (A) NOT DETECTED Final    Comment: CRITICAL RESULT CALLED TO, READ BACK BY AND VERIFIED WITH: A. MASTERS,PHARMD [id number] 01/09/2018 T. TYSOR    Streptococcus species NOT DETECTED NOT DETECTED Final   Streptococcus agalactiae NOT DETECTED NOT DETECTED Final   Streptococcus pneumoniae NOT DETECTED NOT DETECTED Final   Streptococcus pyogenes NOT DETECTED NOT DETECTED Final   Acinetobacter baumannii NOT DETECTED NOT DETECTED Final   Enterobacteriaceae species NOT DETECTED NOT DETECTED Final   Enterobacter cloacae  complex NOT DETECTED NOT DETECTED Final   Escherichia coli NOT DETECTED NOT DETECTED Final   Klebsiella oxytoca NOT DETECTED NOT DETECTED Final   Klebsiella pneumoniae NOT DETECTED NOT DETECTED Final   Proteus species NOT DETECTED NOT DETECTED Final   Serratia marcescens NOT DETECTED NOT DETECTED Final   Haemophilus influenzae NOT DETECTED NOT DETECTED Final   Neisseria meningitidis NOT DETECTED NOT DETECTED Final  Pseudomonas aeruginosa NOT DETECTED NOT DETECTED Final   Candida albicans NOT DETECTED NOT DETECTED Final   Candida glabrata NOT DETECTED NOT DETECTED Final   Candida krusei NOT DETECTED NOT DETECTED Final   Candida parapsilosis NOT DETECTED NOT DETECTED Final   Candida tropicalis NOT DETECTED NOT DETECTED Final      Studies: No results found.  Scheduled Meds: . Chlorhexidine Gluconate Cloth  6 each Topical Daily  . docusate sodium  100 mg Oral BID  . enoxaparin (LOVENOX) injection  60 mg Subcutaneous Q12H  . feeding supplement (ENSURE ENLIVE)  120 mL Oral TID  . gabapentin  100 mg Oral Q8H  . mirtazapine  15 mg Per Tube QHS  . multivitamin with minerals  1 tablet Oral Daily  . mupirocin ointment  1 application Nasal BID  . pantoprazole  40 mg Oral BID    Continuous Infusions:    LOS: 9 days     Kayleen Memos, MD [facility] Pager [phone]  If 7PM-7AM, please contact night-coverage [email or web address] Password [facility] 01/10/2018, 3:42 PM

## 2018-01-10 NOTE — Progress Notes (Addendum)
ANTICOAGULATION CONSULT NOTE - Follow Up Consult  Pharmacy Consult for Lovenox -> Xarelto Indication: DVT  No Known Allergies  Patient Measurements: Height: 5\' 9"  (175.3 cm) Weight: 132 lb (59.9 kg) IBW/kg (Calculated) : 70.7  Vital Signs: Temp: 98.1 F (36.7 C) (01/14 1421) Temp Source: Oral (01/14 1421) BP: 109/86 (01/14 1421) Pulse Rate: 85 (01/14 1421)  Labs: Recent Labs    01/08/18 0249 01/08/18 1224 01/08/18 1457 01/08/18 2143 01/09/18 0624 01/09/18 1156 01/10/18 0653  HGB 5.9* 8.4* 8.1*  --  8.8*  --  8.7*  HCT 18.5* 25.8* 25.1*  --  27.5*  --  27.5*  PLT 102* 96*  --   --  101*  --  117*  HEPARINUNFRC <0.10*  <0.10*  --   --  <0.10*  --  0.22*  --   CREATININE 0.75  --   --   --   --   --   --     Estimated Creatinine Clearance: 85.3 mL/min (by C-G formula based on SCr of 0.75 mg/dL).  Assessment:  18 yoM presented with new LLE DVT. CT scan showed extensive thrombosis in the IVC, common iliac, left iliac vein, and femoral vein. Of note, patient has a history of GIB on Xarelto (for bilateral DVT in 2018) and mild nosebleed on Eliquis this admission after 2 doses on 1/4.    S/p thrombectomy on 01/06/18.  Transitioned from IV heparin to Lovenox on 1/13 pm. Lovenox given last night, but patient unwilling to continue with Lovenox, refused this morning. Changing to Xarelto, which is willing to resume.  On Protonix 40 mg PO BID. No interactions between Xarelto and chemotherapy meds.  Goal of Therapy:  appropriate Xarelto dose for indication Monitor platelets by anticoagulation protocol: Yes   Plan:   Xarelto 15 mg BID x 21 days, then 20 mg daily with supper.  Discussed with patient.  He will report any bleeding.  Please prescribe Xarelto starter pack at discharge.  Arty Baumgartner, [place] Pager: [phone] 01/10/2018,5:27 PM

## 2018-01-10 NOTE — Progress Notes (Signed)
PT Cancellation Note  Patient Details Name: FISHEL WAMBLE MRN: [id number] [name]: 27-Mar-1961   Cancelled Treatment:    Reason Eval/Treat Not Completed: Pain limiting ability to participate. Pt reports severe headache and does not wish to participate with PT at this time. Reports he has already had pain medication and would like to rest and let it "kick in". Will check back as schedule allows to complete PT eval.    Thelma Comp 01/10/2018, 7:52 AM   Rolinda Roan, PT, DPT Acute Rehabilitation Services Pager: [phone]

## 2018-01-10 NOTE — Discharge Instructions (Addendum)
Bleeding Precautions When on Anticoagulant Therapy WHAT IS ANTICOAGULANT THERAPY? Anticoagulant therapy is taking medicine to prevent or reduce blood clots. It is also called blood thinner therapy. Blood clots that form in your blood vessels can be dangerous. They can break loose and travel to your heart, lungs, or brain. This increases your risk of a heart attack or stroke. Anticoagulant therapy causes blood to clot more slowly. You may need anticoagulant therapy if you have:  A medical condition that increases the likelihood that blood clots will form.  A heart defect or a problem with heart rhythm. It is also a common treatment after heart surgery, such as valve replacement. WHAT ARE COMMON TYPES OF ANTICOAGULANT THERAPY? Anticoagulant medicine can be injected or taken by mouth.If you need anticoagulant therapy quickly at the hospital, the medicine may be injected under your skin or given through an IV tube. Heparin is a common example of an anticoagulant that you may get at the hospital. Most anticoagulant therapy is in the form of pills that you take at home every day. These may include:  Aspirin. This common blood thinner works by preventing blood cells (platelets) from sticking together to form a clot. Aspirin is not as strong as anticoagulants that slow down the time that it takes for your body to form a clot.  Clopidogrel. This is a newer type of drug that affects platelets. It is stronger than aspirin.  Warfarin. This is the most common anticoagulant. It changes the way your body uses vitamin K, a vitamin that helps your blood to clot. The risk of bleeding is higher with warfarin than with aspirin. You will need frequent blood tests to make sure you are taking the safest amount.  New anticoagulants. Several new drugs have been approved. They are all taken by mouth. Studies show that these drugs work as well as warfarin. They do not require blood testing. They may cause less bleeding  risk than warfarin. WHAT DO I NEED TO REMEMBER WHEN TAKING ANTICOAGULANT THERAPY? Anticoagulant therapy decreases your risk of forming a blood clot, but it increases your risk of bleeding. Work closely with your health care provider to make sure you are taking your medicine safely. These tips can help:  Learn ways to reduce your risk of bleeding.  If you are taking warfarin: ? Have blood tests as ordered by your health care provider. ? Do not make any sudden changes to your diet. Vitamin K in your diet can make warfarin less effective. ? Do not get pregnant. This medicine may cause birth defects.  Take your medicine at the same time every day. If you forget to take your medicine, take it as soon as you remember. If you miss a whole day, do not double your dose of medicine. Take your normal dose and call your health care provider to check in.  Do not stop taking your medicine on your own.  Tell your health care provider before you start taking any new medicine, vitamin, or herbal product. Some of these could interfere with your therapy.  Tell all of your health care providers that you are on anticoagulant therapy.  Do not have surgery, medical procedures, or dental work until you tell your health care provider that you are on anticoagulant therapy. WHAT CAN AFFECT HOW ANTICOAGULANTS WORK? Certain foods, vitamins, medicines, supplements, and herbal medicines change the way that anticoagulant therapy works. They may increase or decrease the effects of your anticoagulant therapy. Either result can be dangerous for you.  Many over-the-counter medicines for pain, colds, or stomach problems interfere with anticoagulant therapy. Take these only as told by your health care provider.  Do not drink alcohol. It can interfere with your medicine and increase your risk of an injury that causes bleeding.  If you are taking warfarin, do not begin eating more foods that contain vitamin K. These include  leafy green vegetables. Ask your health care provider if you should avoid any foods. WHAT ARE SOME WAYS TO PREVENT BLEEDING? You can prevent bleeding by taking certain precautions:  Be extra careful when you use knives, scissors, or other sharp objects.  Use an electric razor instead of a blade.  Do not use toothpicks.  Use a soft toothbrush.  Wear shoes that have nonskid soles.  Use bath mats and handrails in your bathroom.  Wear gloves while you do yard work.  Wear a helmet when you ride a bike.  Wear your seat belt.  Prevent falls by removing loose rugs and extension cords from areas where you walk.  Do not play contact sports or participate in other activities that have a high risk of injury. [place] PROVIDER? Call your health care provider if:  You miss a dose of medicine: ? And you are not sure what to do. ? For more than one day.  You have: ? Menstrual bleeding that is heavier than normal. ? Blood in your urine. ? A bloody nose or bleeding gums. ? Easy bruising. ? Blood in your stool (feces) or have black and tarry stool. ? Side effects from your medicine.  You feel weak or dizzy.  You become pregnant. Seek immediate medical care if:  You have bleeding that will not stop.  You have sudden and severe headache or belly pain.  You vomit or you cough up bright red blood.  You have a severe blow to your head. WHAT ARE SOME QUESTIONS TO ASK MY HEALTH CARE PROVIDER?  What is the best anticoagulant therapy for my condition?  What side effects should I watch for?  When should I take my medicine? What should I do if I forget to take it?  Will I need to have regular blood tests?  Do I need to change my diet? Are there foods or drinks that I should avoid?  What activities are safe for me?  What should I do if I want to get pregnant? This information is not intended to replace advice given to you by your health care provider.  Make sure you discuss any questions you have with your health care provider. Document Released: 11/25/2015 Document Reviewed: 11/25/2015 Elsevier Interactive Patient Education  2017 [place] [employer] How will my loved one be cared for?  Talk with your loved one about how he or she would like to be cared for in his or her last days. Care options may include:  Home care. Many people choose to die in their own home or in the home of a family member. This may require family members to take on the role of caregivers.  Hospice care. Hospice is a service that is designed to provide medical, spiritual, and psychological support to people who are terminally ill and their families. Hospice care can take place in a variety of settings, including at home.  Hospital care. Some people prefer the comfort of having nurses and doctors nearby at all times.  Nursing home care. Nursing homes have medical staff  on duty at all times.  Spiritual care. A priest, minister, or spiritual leader can be included as a member of the caregiving team to provide guidance and counseling along the way.  You may choose to combine several care options. What changes will I see in my loved one? Your loved one will go through some changes when death is near. He or she may:  Sleep more.  Urinate less and have fewer bowel movements.  Have mucus in the mouth.  Feel colder and look bluer in color.  Be restless and behave unusually. For example, he or she may pull on the sheets, talk to people who are not in the room, or talk to people who have already died.  Have breathing changes. Breathing may not be as deep, or it may become deeper and sound like snoring. There may be as many as 30 seconds between breaths. Breathing changes may come and go.  How can I help? Here are some things that you can do to keep your loved one comfortable:  Keep a light on in your loved one's room. He or she may  not be able to see well when awake.  Wait until your loved one wakes up to give any needed medicine.  Do not force your loved one to eat, drink, or take medicine.  If your loved one has thick mucus in the throat, it may help to raise his or her head and shoulders on pillows.  Put bed pads under your loved one. Change them when needed to keep them clean and dry.  If your loved one talks to people who are not there, do not try to correct him or her.  Comfort your loved one if he or she seems scared.  Call a health care provider if your loved one expresses having a need to urinate and being unable to do so.  Keep your loved one's mouth moist for comfort. To do this you may: ? Offer chips of ice if he or she is still able to chew. ? Put petroleum jelly on his or her lips to keep them from being too dry. ? Gently wipe out his or her mouth with wet mouth sponges. You can get these sponges from your health care provider.  What happens at the time of death? At the time of death:  Breathing stops.  The heart stops beating.  The eyes may be partly open.  The mouth may drop open a little.  These are all natural occurrences. Feelings of grief or relief at this time are normal. Talk with your caregiving team about any questions or needs you have. This information is not intended to replace advice given to you by your health care provider. Make sure you discuss any questions you have with your health care provider. Document Released: 12/02/2009 Document Revised: 09/23/2016 Document Reviewed: 11/21/2014 Elsevier Interactive Patient Education  2018 [employer]. Please call Dr. Ernestina Penna office tomorrow morning first thing for an appointment to discuss your pain and blood thinner options.   ==========================================================================  Information on my medicine - XARELTO (rivaroxaban)  This medication education was reviewed with me or my healthcare  representative as part of my discharge preparation.  The pharmacist that spoke with me during my hospital stay was:  Arty Baumgartner, [place]? Xarelto was prescribed to treat blood clots that may have been found in the veins of your legs (deep vein thrombosis) or in your lungs (pulmonary embolism) and  to reduce the risk of them occurring again.  What do you need to know about Xarelto? The starting dose is one 15 mg tablet taken TWICE daily with food for the FIRST 21 DAYS then on (enter date)  02/01/17  the dose is changed to one 20 mg tablet taken ONCE A DAY with your evening meal.  DO NOT stop taking Xarelto without talking to the health care provider who prescribed the medication.  Refill your prescription for 20 mg tablets before you run out.  After discharge, you should have regular check-up appointments with your healthcare provider that is prescribing your Xarelto.  In the future your dose may need to be changed if your kidney function changes by a significant amount.  What do you do if you miss a dose? If you are taking Xarelto TWICE DAILY and you miss a dose, take it as soon as you remember. You may take two 15 mg tablets (total 30 mg) at the same time then resume your regularly scheduled 15 mg twice daily the next day.  If you are taking Xarelto ONCE DAILY and you miss a dose, take it as soon as you remember on the same day then continue your regularly scheduled once daily regimen the next day. Do not take two doses of Xarelto at the same time.   Important Safety Information Xarelto is a blood thinner medicine that can cause bleeding. You should call your healthcare provider right away if you experience any of the following: ? Bleeding from an injury or your nose that does not stop. ? Unusual colored urine (red or dark brown) or unusual colored stools (red or black). ? Unusual bruising for unknown reasons. ? A serious fall or if you hit your  head (even if there is no bleeding).  Some medicines may interact with Xarelto and might increase your risk of bleeding while on Xarelto. To help avoid this, consult your healthcare provider or pharmacist prior to using any new prescription or non-prescription medications, including herbals, vitamins, non-steroidal anti-inflammatory drugs (NSAIDs) and supplements.  This website has more information on Xarelto: [email or web address]

## 2018-01-10 NOTE — Care Management Note (Addendum)
Case Management Note  Patient Details  Name: Jeff [name] MRN: [id number] Date of Birth: March 19, 1961  Subjective/Objective:  Pt presented for LE DVT- Plan for Xarelto for home. PTA from home with support of daughter. Plan will be home with new Xarelto. Pt uses the Outpatient [facility] is available. Pt will need HHRN, PT, SW once stable for d/c.                  Action/Plan: Agency List provided and pt chose [facility]. CM did make referral with [facility] and Liaison to check with Office to see if can schedule. Pt wants to see if he can qualify for PCS- daughter is aware who she can contact for this information. CM awaiting call back in regards to [facility].   Expected Discharge Date:  (unknown)               Expected Discharge Plan:  [place]  In-House Referral:  Clinical Social Work  Discharge planning Services  CM Consult  Post Acute Care Choice:  Home Health Choice offered to:  Adult Children, Patient  DME Arranged:  N/A DME Agency:  NA  HH Arranged:  RN, PT, Nurse's Aide, Disease Management, Social Work [employer] Agency:  [facility]  Status of Service:  Completed, signed off  If discussed at [employer], dates discussed:    Additional Comments: [id number] 01-10-18 Jacqlyn Krauss, RN,BSN [id number] CM did speak with Alvis Lemmings and unable to take the patient. Pt states [facility] is appropriate. CM did make referral and SOC to begin within 24-48 hours post d/c. Daughter is aware and SW to assist with navigation for [employer] No further needs from CM at this time.  Bethena Roys, RN 01/10/2018, 12:35 PM

## 2018-01-10 NOTE — Progress Notes (Signed)
Referring Physician(s): Dr Aileen Fass  Supervising Physician: Arne Cleveland  Patient Status:  [facility] - In-pt  Chief Complaint:  57 year-old Jeff [name] with metastatic colon cancer an acute severe iliocaval and left lower extremity DVT. He presents for thrombectomy.  Subjective:  1/10 procedure: 1. Ultrasound-guided vascular access left popliteal vein 2. Ultrasound-guided access right common femoral vein 3. Ultrasound-guided access right internal jugular vein 4. Second ultrasound-guided access right internal jugular vein 5. Left lower extremity venogram 6. Right lower extremity venogram 7. Inferior vena cavogram 8. Combined pharmacomechanical thrombolysis/thrombectomy using tPA, the AngioVac device, and the Angiojet device 9. IVC filter retrieval 10. Recanalization and balloon angioplasty of the right common iliac vein 11. Recanalization and angioplasty with subsequent stent placement in the left iliac vein 12. Balloon angioplasty of the left common femoral and femoral veins  Pt doing well States Lovenox is painful ---not easily tolerated per pt. RN tells me "Lovenox may interfere with his chemo meds" per MD They are discussing alternatives. I will inform Dr Laurence Ferrari  Allergies: Patient has no known allergies.  Medications: Prior to Admission medications   Medication Sig Start Date End Date Taking? Authorizing Provider  diphenoxylate-atropine (LOMOTIL) 2.5-0.025 MG tablet Take 1-2 tablets by mouth 4 (four) times daily as needed for diarrhea or loose stools. 12/13/17  Yes [name], MD  docusate sodium (COLACE) 100 MG capsule Take 100 mg by mouth 2 (two) times daily.   Yes [provider]  gabapentin (NEURONTIN) 100 MG capsule Take 1 capsule (100 mg total) 3 (three) times daily by mouth. 11/15/17  Yes [name], MD  Nutritional Supplements (NUTRITIONAL SUPPLEMENT PO) House Supplement - give 120 ml by mouth three times daily between meals for supplement   Yes  [provider]  Oxycodone HCl 10 MG TABS Take 1 tablet (10 mg total) by mouth every 6 (six) hours as needed. 12/13/17  Yes [name], MD  Clindamycin Phos-Benzoyl Perox gel Apply 1 application topically 2 (two) times daily. Patient not taking: Reported on 12/30/2017 12/27/17   [name], MD  ferrous sulfate 325 (Jeff FE) MG tablet Take 1 tablet (325 mg total) by mouth 2 (two) times daily with a meal. Patient not taking: Reported on 12/27/2017 03/29/17   Maryanna Shape, NP  hydrocortisone 2.5 % cream Apply topically 2 (two) times daily. Patient not taking: Reported on 12/30/2017 10/18/17   Alla Feeling, NP  loperamide (IMODIUM A-D) 2 MG tablet Take 1 tablet (2 mg total) by mouth 4 (four) times daily as needed for diarrhea or loose stools. Patient not taking: Reported on 12/30/2017 03/02/17   [name], MD  magnesium oxide (MAG-OX) 400 (241.3 Mg) MG tablet Take 1 tablet (400 mg total) by mouth 2 (two) times daily. Patient not taking: Reported on 12/30/2017 12/13/17   [name], MD  magnesium oxide (MAG-OX) 400 (241.3 Mg) MG tablet Take 1 tablet (400 mg total) by mouth daily. Patient not taking: Reported on 12/30/2017 12/27/17   [name], MD  mirtazapine (REMERON) Jeff MG tablet Take 1 tablet (Jeff mg total) by mouth at bedtime. Patient not taking: Reported on 12/27/2017 06/22/17   Gardenia Phlegm, NP  ondansetron (ZOFRAN) 8 MG tablet Take 1 tablet (8 mg total) by mouth 2 (two) times daily as needed for refractory nausea / vomiting. Start on day 3 after chemotherapy. Patient not taking: Reported on 12/30/2017 05/25/17   [name], MD  potassium chloride SA (K-DUR,KLOR-CON) 20 MEQ tablet Take 1 tablet (20 mEq  total) by mouth 2 (two) times daily. Patient not taking: Reported on 12/30/2017 12/27/17   [name], MD  prochlorperazine (COMPAZINE) 10 MG tablet Take 1 tablet (10 mg total) by mouth every 6 (six) hours as needed (Nausea or vomiting). Patient not taking: Reported on 12/30/2017 05/25/17   [name], MD     Vital Signs: BP 112/77 (BP Location: Right Arm)   Pulse (!) 101   Temp 98.3 F (36.8 C) (Oral)   Resp 18   Ht 5\' 9"  (1.753 m)   Wt 132 lb (59.9 kg)   SpO2 99%   BMI 19.49 kg/m   Physical Exam  Constitutional: He is oriented to person, place, and time.  Pulmonary/Chest: Effort normal and breath sounds normal.  Abdominal: Soft.  Musculoskeletal: Normal range of motion. He exhibits no tenderness.  Neurological: He is alert and oriented to person, place, and time.  Skin: Skin is warm and dry.  Psychiatric: He has a normal mood and affect. His behavior is normal.  Nursing note and vitals reviewed.   Imaging: Ct Abdomen Pelvis Wo Contrast  Result Date: 01/08/2018 CLINICAL DATA:  Hematuria. Rule out intra-abdominal bleed. Metastatic colon cancer. DVT, recent thrombectomy. EXAM: CT ABDOMEN AND PELVIS WITHOUT CONTRAST TECHNIQUE: Multidetector CT imaging of the abdomen and pelvis was performed following the standard protocol without IV contrast. COMPARISON:  Procedure note of 01/06/2018. Contrast-enhanced CT of 12/30/2017. FINDINGS: Lower chest: Bibasilar atelectasis. Normal heart size with trace bilateral pleural effusion. Hepatobiliary: Grossly normal liver, gallbladder, biliary tract. Pancreas: Normal noncontrast appearance of the pancreas. Spleen: Soft tissue fullness within the splenic hilum, corresponding to the mass detailed on the prior contrast-enhanced exam. Grossly similar, including at 5.2 x 4.4 cm today. Adrenals/Urinary Tract: Normal adrenal glands. No renal calculi or hydronephrosis. Foley catheter within the urinary bladder. Stomach/Bowel: Normal stomach, without wall thickening. Double-barrel transverse colostomy. Normal caliber small bowel loops. Vascular/Lymphatic: Aortic and branch vessel atherosclerosis. Limited evaluation for abdominal adenopathy. No pelvic sidewall adenopathy. Left common and external iliac venous stent. Reproductive: Normal prostate. Other: No  free intraperitoneal air. Diffuse anasarca. Mesenteric edema, without significant fluid. No evidence of intraperitoneal hemorrhage. Musculoskeletal: Right proximal femoral fixation. Remote right rib trauma. IMPRESSION: 1. Limited exam, without oral or IV contrast. 2. No evidence of abdominopelvic hemorrhage. 3. Small bilateral pleural effusions. 4. Grossly similar mass within the splenic hilum. 5.  Aortic Atherosclerosis (ICD10-I70.0). Electronically Signed   By: Abigail Miyamoto M.D.   On: 01/08/2018 13:12   Ir Veno/ext/bi  Result Date: 01/07/2018 INDICATION: Jeff Jeff [name] with metastatic colon cancer an acute severe iliocaval and left lower extremity DVT. He presents for thrombectomy. EXAM: 1. Ultrasound-guided vascular access left popliteal vein 2. Ultrasound-guided access right common femoral vein 3. Ultrasound-guided access right internal jugular vein 4. Second ultrasound-guided access right internal jugular vein 5. Left lower extremity venogram 6. Right lower extremity venogram 7. Inferior vena cavogram 8. Combined pharmacomechanical thrombolysis/thrombectomy using tPA, the AngioVac device, and the Angiojet device 9. IVC filter retrieval 10. Recanalization and balloon angioplasty of the right common iliac vein 11. Recanalization and angioplasty with subsequent stent placement in the left iliac vein 12. Balloon angioplasty of the left common femoral and femoral veins Operating Physicians: Corrie Mckusick, DO and Jacqulynn Cadet, MD COMPARISON:  CT scan 12/30/2017 MEDICATIONS: 12 mg tPA administered directly into thrombus throughout the course of the procedure ANESTHESIA/SEDATION: General anesthesia provided by the anesthesiology service FLUOROSCOPY TIME:  Fluoroscopy Time: 67 minutes 6 seconds (816.6 mGy). COMPLICATIONS: None immediate. TECHNIQUE:  Informed written consent was obtained from the patient after a thorough discussion of the procedural risks, benefits and alternatives. All questions were addressed.  Maximal Sterile Barrier Technique was utilized including caps, mask, sterile gowns, sterile gloves, sterile drape, hand hygiene and skin antiseptic. A timeout was performed prior to the initiation of the procedure. The patient was initially positioned prone. The left popliteal vein was interrogated with ultrasound and found to be widely patent. An image was obtained and stored for the medical record. Local anesthesia was attained by infiltration with 1% lidocaine. A small dermatotomy was made. Under real-time sonographic guidance, the vessel was punctured with a 21 gauge micropuncture needle. Using standard technique, the initial micro needle was exchanged over a 0.018 micro wire for a transitional 4 [place] micro sheath. The micro sheath was then exchanged over a 0.035 wire for a 45 cm 6 [place] braided Arrow sheath. A left lower extremity venogram was performed. The femoral vein is completely occluded in the thigh. The popliteal vein remains patent. 6 mg tPA was laced throughout the thrombus within the common and femoral veins. The patient was repositioned supine and intubated. The right common femoral was interrogated with ultrasound and found to be patent with changes of chronic DVT. An image was obtained and stored for the medical record. Local anesthesia was attained by infiltration with 1% lidocaine. A small dermatotomy was made. Under real-time sonographic guidance, the vessel was punctured with a 21 gauge micropuncture needle. Using standard technique, the initial micro needle was exchanged over a 0.018 micro wire for a transitional 4 [place] micro sheath. The micro sheath was then exchanged over a 0.035 wire for a 6 French vascular sheath. Right lower extremity venography was performed. The common femoral and external iliac veins are patent. 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 All venous drainage is via parallel spinous collaterals. An angled catheter and glidewire were introduced through the  sheath. With some manipulation, the catheter was successfully advanced into the suprarenal IVC. An inferior vena cavagram was performed confirming patency of both renal veins and the super renal IVC. A superstiff Amplatz wire was advanced into the right heart. A 10 x 40 mm Conquest balloon was introduced over the wire the occluded segment of the common iliac vein was angioplastied. Repeat venography demonstrates recreation of a small vascular channel. A 10 x 40 mm Conquest balloon was then selected and introduced over the wire. Venography demonstrates successful recanalization of the common iliac vein. There is flow through the IVC. 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 The 6 French sheath was exchanged in the soft tissue tract dilated to 24 [place]. A 26 French Gore dry seal sheath was then advanced over the wire and positioned in the right common iliac vein. The right internal jugular vein was interrogated with ultrasound and found to be widely patent. An image was obtained and stored for the medical record. Local anesthesia was attained by infiltration with 1% lidocaine. A small dermatotomy was made. Under real-time sonographic guidance, the vessel was punctured with a 21 gauge micropuncture needle. Using standard technique, the initial micro needle was exchanged over a 0.018 micro wire for a transitional 4 [place] micro sheath. The micro sheath was then exchanged over a 0.035 wire for a 6 French vascular sheath. A superstiff Amplatz wire was advanced into the suprarenal IVC. The skin tract was then serially dilated and an 67 French reperfusion catheter was advanced and position with the tip at the  cavoatrial junction. At this time, the patient was fully heparinized until the ACT level was greater than 300 and the AngioVac circuit was established. The angio [facility] device was advanced through the 68 French sheath from the right common femoral approach and suction  thrombectomy was performed throughout the infrarenal inferior vena cava. Working from the popliteal access, additional left lower extremity venography images were performed focusing on the iliac and common femoral vasculature. Thrombus is present within the common femoral vein and the external iliac vein. The left common iliac vein is completely occluded similar to the other side. Attempts were made to pass through the occluded left common iliac vein from below. This was unsuccessful. Therefore, a second right internal jugular venous access was required. The right internal jugular vein was was again interrogated with ultrasound and found to be widely patent. An image was obtained and stored for the medical record. Local anesthesia was attained by infiltration with 1% lidocaine. A small dermatotomy was made. Under real-time sonographic guidance, the vessel was punctured with a 21 gauge micropuncture needle. Using standard technique, the initial micro needle was exchanged over a 0.018 micro wire for a transitional 4 [place] micro sheath. The micro sheath was then exchanged over a 0.035 wire for a 6 French vascular sheath. An angled catheter and glidewire were advanced through the sheath, through the IVC filter, through the infrarenal IVC and the catheter successfully navigated across the chronically occluded left common iliac vein. The glidewire was then exchanged for an exchange length Glidewire and snared from the femoral vein in the midthigh and brought through the popliteal sheath access resulting in successful through and through access from the right internal jugular vein through the left popliteal vein. A catheter was then advanced over the wire into the femoral vein and the glidewire exchanged for a superstiff Amplatz wire. At this time, another cavagram was performed demonstrating successful debulking of the caval thrombus. At this point it was decided to proceed with removal of the IVC filter. Over the  superstiff Amplatz wire from the right jugular vein approach, the skin tract was dilated and a second 26 [place] Gore dry seal sheath was advanced and positioned in the suprarenal IVC. A Bard IVC filter retrieval set was then advanced coaxially through this device next to the Amplatz wire. The Bard [place] IVC filter was successfully retrieved using standard technique. Additional thrombectomy was then performed using the angio VAC device. This yielded significant material. Repeat vena cavagram demonstrates complete removal of thrombus from the inferior vena cava. Attention was next turned to the occluded left common iliac artery. The perfusion circuit was reversed and the reperfusion catheter was placed in the right femoral sheath and the angio back device was advanced through the right IJ sheath. Suction thrombectomy was performed in the right common iliac vein. Working from the popliteal access, angioplasty was performed of the common femoral, external iliac and common iliac veins and thrombus was pushed into the AngioVac. There appears to be a significant stenosis at the transition from the common iliac to the external iliac vein. This was resistant to initial angioplasty, so a 14 x 60 mm wall stent was selected, placed across the lesion and deployed. The wall stent was then post dilated to 12 mm using the 12 by 40 Conquest balloon. Catheter venography demonstrates significant near occlusive thrombus throughout the common femoral vein, and femoral vein in the thigh. Therefore, the decision was made to proceed with additional thrombectomy from the popliteal access using the [facility]  Gaffer. The 6 French left popliteal sheath was exchanged over the wire for a 9 French sheath. The is a long take catheter was advanced over the wire and real lytic thrombectomy performed throughout the femoral, common femoral and iliac veins. Follow-up angiography demonstrates significant improvement with restoration  of in-line flow. There is a significant residual stenosis at the common femoral vein. This was angioplastied to 12 mm using the 12 x 40 mm Conquest balloon. On the final angiographic runs, there is successful restoration of in-line flow from the popliteal access through the IVC as well as successful recanalization of both iliac veins. All wires and devices were removed. Once the patient's ACT drop below 200, the sheaths were removed. Hemostasis was attained with a combination of manual pressure and pursestring sutures at both 26 [place] sheath sites. 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 Significant stenosis at the transition from the left common to the left external iliac vein 3. 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 Successful recanalization and angioplasty of the chronically occluded right common iliac vein 2. Successful thrombolysis/thrombectomy of the inferior vena cava, left-sided iliac veins and left lower extremity deep venous system to the level of the popliteal vein. 3. Stent assisted angioplasty of a flow limiting stenosis at the interface of the left common and external iliac veins. 4. Angioplasty of a stenotic segment of the left common femoral vein. 5. The IVC filter was retrieved.  No additional filter was placed. PLAN: 1. Continue full-dose heparin and transitioned to weight based Lovenox prior to discharge. 2. Progress toward ambulation as soon as clinically possible. 3. Compression garment or Ace wraps to the left lower extremity if needed to facilitate edema reduction. 4. Follow-up with Dr. Laurence Ferrari in interventional radiology clinic in 2 weeks. 5. If patient demonstrates any evidence of bleeding and needs to come off of his anticoagulation in the future, he must have an IVC filter replaced. Signed, Criselda Peaches, MD [facility] Radiology Electronically Signed   By: Jacqulynn Cadet M.D.    On: 01/07/2018 10:46   Ir Mariana Arn Ivc  Result Date: 01/07/2018 INDICATION: Jeff Jeff [name] with metastatic colon cancer an acute severe iliocaval and left lower extremity DVT. He presents for thrombectomy. EXAM: 1. Ultrasound-guided vascular access left popliteal vein 2. Ultrasound-guided access right common femoral vein 3. Ultrasound-guided access right internal jugular vein 4. Second ultrasound-guided access right internal jugular vein 5. Left lower extremity venogram 6. Right lower extremity venogram 7. Inferior vena cavogram 8. Combined pharmacomechanical thrombolysis/thrombectomy using tPA, the AngioVac device, and the Angiojet device 9. IVC filter retrieval 10. Recanalization and balloon angioplasty of the right common iliac vein 11. Recanalization and angioplasty with subsequent stent placement in the left iliac vein 12. Balloon angioplasty of the left common femoral and femoral veins Operating Physicians: Corrie Mckusick, DO and Jacqulynn Cadet, MD COMPARISON:  CT scan 12/30/2017 MEDICATIONS: 12 mg tPA administered directly into thrombus throughout the course of the procedure ANESTHESIA/SEDATION: General anesthesia provided by the anesthesiology service FLUOROSCOPY TIME:  Fluoroscopy Time: 67 minutes 6 seconds (816.6 mGy). COMPLICATIONS: None immediate. TECHNIQUE: Informed written consent was obtained from the patient after a thorough discussion of the procedural risks, benefits and alternatives. All questions were addressed. Maximal Sterile Barrier Technique was utilized including caps, mask, sterile gowns, sterile gloves, sterile drape, hand hygiene and skin antiseptic. A timeout was performed prior to the initiation of the procedure. The patient was initially  positioned prone. The left popliteal vein was interrogated with ultrasound and found to be widely patent. An image was obtained and stored for the medical record. Local anesthesia was attained by infiltration with 1% lidocaine. A small  dermatotomy was made. Under real-time sonographic guidance, the vessel was punctured with a 21 gauge micropuncture needle. Using standard technique, the initial micro needle was exchanged over a 0.018 micro wire for a transitional 4 [place] micro sheath. The micro sheath was then exchanged over a 0.035 wire for a 45 cm 6 [place] braided Arrow sheath. A left lower extremity venogram was performed. The femoral vein is completely occluded in the thigh. The popliteal vein remains patent. 6 mg tPA was laced throughout the thrombus within the common and femoral veins. The patient was repositioned supine and intubated. The right common femoral was interrogated with ultrasound and found to be patent with changes of chronic DVT. An image was obtained and stored for the medical record. Local anesthesia was attained by infiltration with 1% lidocaine. A small dermatotomy was made. Under real-time sonographic guidance, the vessel was punctured with a 21 gauge micropuncture needle. Using standard technique, the initial micro needle was exchanged over a 0.018 micro wire for a transitional 4 [place] micro sheath. The micro sheath was then exchanged over a 0.035 wire for a 6 French vascular sheath. Right lower extremity venography was performed. The common femoral and external iliac veins are patent. 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 All venous drainage is via parallel spinous collaterals. An angled catheter and glidewire were introduced through the sheath. With some manipulation, the catheter was successfully advanced into the suprarenal IVC. An inferior vena cavagram was performed confirming patency of both renal veins and the super renal IVC. A superstiff Amplatz wire was advanced into the right heart. A 10 x 40 mm Conquest balloon was introduced over the wire the occluded segment of the common iliac vein was angioplastied. Repeat venography demonstrates recreation of a small vascular channel. A 10 x 40 mm Conquest  balloon was then selected and introduced over the wire. Venography demonstrates successful recanalization of the common iliac vein. There is flow through the IVC. 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 The 6 French sheath was exchanged in the soft tissue tract dilated to 24 [place]. A 26 French Gore dry seal sheath was then advanced over the wire and positioned in the right common iliac vein. The right internal jugular vein was interrogated with ultrasound and found to be widely patent. An image was obtained and stored for the medical record. Local anesthesia was attained by infiltration with 1% lidocaine. A small dermatotomy was made. Under real-time sonographic guidance, the vessel was punctured with a 21 gauge micropuncture needle. Using standard technique, the initial micro needle was exchanged over a 0.018 micro wire for a transitional 4 [place] micro sheath. The micro sheath was then exchanged over a 0.035 wire for a 6 French vascular sheath. A superstiff Amplatz wire was advanced into the suprarenal IVC. The skin tract was then serially dilated and an 3 French reperfusion catheter was advanced and position with the tip at the cavoatrial junction. At this time, the patient was fully heparinized until the ACT level was greater than 300 and the AngioVac circuit was established. The angio [facility] device was advanced through the 66 French sheath from the right common femoral approach and suction thrombectomy was performed throughout the infrarenal inferior vena cava. Working from the popliteal access, additional  left lower extremity venography images were performed focusing on the iliac and common femoral vasculature. Thrombus is present within the common femoral vein and the external iliac vein. The left common iliac vein is completely occluded similar to the other side. Attempts were made to pass through the occluded left common iliac vein from below. This was  unsuccessful. Therefore, a second right internal jugular venous access was required. The right internal jugular vein was was again interrogated with ultrasound and found to be widely patent. An image was obtained and stored for the medical record. Local anesthesia was attained by infiltration with 1% lidocaine. A small dermatotomy was made. Under real-time sonographic guidance, the vessel was punctured with a 21 gauge micropuncture needle. Using standard technique, the initial micro needle was exchanged over a 0.018 micro wire for a transitional 4 [place] micro sheath. The micro sheath was then exchanged over a 0.035 wire for a 6 French vascular sheath. An angled catheter and glidewire were advanced through the sheath, through the IVC filter, through the infrarenal IVC and the catheter successfully navigated across the chronically occluded left common iliac vein. The glidewire was then exchanged for an exchange length Glidewire and snared from the femoral vein in the midthigh and brought through the popliteal sheath access resulting in successful through and through access from the right internal jugular vein through the left popliteal vein. A catheter was then advanced over the wire into the femoral vein and the glidewire exchanged for a superstiff Amplatz wire. At this time, another cavagram was performed demonstrating successful debulking of the caval thrombus. At this point it was decided to proceed with removal of the IVC filter. Over the superstiff Amplatz wire from the right jugular vein approach, the skin tract was dilated and a second 26 [place] Gore dry seal sheath was advanced and positioned in the suprarenal IVC. A Bard IVC filter retrieval set was then advanced coaxially through this device next to the Amplatz wire. The Bard [place] IVC filter was successfully retrieved using standard technique. Additional thrombectomy was then performed using the angio VAC device. This yielded significant material. Repeat  vena cavagram demonstrates complete removal of thrombus from the inferior vena cava. Attention was next turned to the occluded left common iliac artery. The perfusion circuit was reversed and the reperfusion catheter was placed in the right femoral sheath and the angio back device was advanced through the right IJ sheath. Suction thrombectomy was performed in the right common iliac vein. Working from the popliteal access, angioplasty was performed of the common femoral, external iliac and common iliac veins and thrombus was pushed into the AngioVac. There appears to be a significant stenosis at the transition from the common iliac to the external iliac vein. This was resistant to initial angioplasty, so a 14 x 60 mm wall stent was selected, placed across the lesion and deployed. The wall stent was then post dilated to 12 mm using the 12 by 40 Conquest balloon. Catheter venography demonstrates significant near occlusive thrombus throughout the common femoral vein, and femoral vein in the thigh. Therefore, the decision was made to proceed with additional thrombectomy from the popliteal access using the [facility]. The 6 French left popliteal sheath was exchanged over the wire for a 9 French sheath. The is a long take catheter was advanced over the wire and real lytic thrombectomy performed throughout the femoral, common femoral and iliac veins. Follow-up angiography demonstrates significant improvement with restoration of in-line flow. There is a significant residual  stenosis at the common femoral vein. This was angioplastied to 12 mm using the 12 x 40 mm Conquest balloon. On the final angiographic runs, there is successful restoration of in-line flow from the popliteal access through the IVC as well as successful recanalization of both iliac veins. All wires and devices were removed. Once the patient's ACT drop below 200, the sheaths were removed. Hemostasis was attained with a combination of  manual pressure and pursestring sutures at both 26 [place] sheath sites. 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 Significant stenosis at the transition from the left common to the left external iliac vein 3. 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 Successful recanalization and angioplasty of the chronically occluded right common iliac vein 2. Successful thrombolysis/thrombectomy of the inferior vena cava, left-sided iliac veins and left lower extremity deep venous system to the level of the popliteal vein. 3. Stent assisted angioplasty of a flow limiting stenosis at the interface of the left common and external iliac veins. 4. Angioplasty of a stenotic segment of the left common femoral vein. 5. The IVC filter was retrieved.  No additional filter was placed. PLAN: 1. Continue full-dose heparin and transitioned to weight based Lovenox prior to discharge. 2. Progress toward ambulation as soon as clinically possible. 3. Compression garment or Ace wraps to the left lower extremity if needed to facilitate edema reduction. 4. Follow-up with Dr. Laurence Ferrari in interventional radiology clinic in 2 weeks. 5. If patient demonstrates any evidence of bleeding and needs to come off of his anticoagulation in the future, he must have an IVC filter replaced. Signed, Criselda Peaches, MD [facility] Radiology Electronically Signed   By: Jacqulynn Cadet M.D.   On: 01/07/2018 10:46   Ir Thrombect Veno Mech Mod Sed  Result Date: 01/07/2018 INDICATION: Jeff Jeff [name] with metastatic colon cancer an acute severe iliocaval and left lower extremity DVT. He presents for thrombectomy. EXAM: 1. Ultrasound-guided vascular access left popliteal vein 2. Ultrasound-guided access right common femoral vein 3. Ultrasound-guided access right internal jugular vein 4. Second ultrasound-guided access right internal jugular vein 5. Left lower  extremity venogram 6. Right lower extremity venogram 7. Inferior vena cavogram 8. Combined pharmacomechanical thrombolysis/thrombectomy using tPA, the AngioVac device, and the Angiojet device 9. IVC filter retrieval 10. Recanalization and balloon angioplasty of the right common iliac vein 11. Recanalization and angioplasty with subsequent stent placement in the left iliac vein 12. Balloon angioplasty of the left common femoral and femoral veins Operating Physicians: Corrie Mckusick, DO and Jacqulynn Cadet, MD COMPARISON:  CT scan 12/30/2017 MEDICATIONS: 12 mg tPA administered directly into thrombus throughout the course of the procedure ANESTHESIA/SEDATION: General anesthesia provided by the anesthesiology service FLUOROSCOPY TIME:  Fluoroscopy Time: 67 minutes 6 seconds (816.6 mGy). COMPLICATIONS: None immediate. TECHNIQUE: Informed written consent was obtained from the patient after a thorough discussion of the procedural risks, benefits and alternatives. All questions were addressed. Maximal Sterile Barrier Technique was utilized including caps, mask, sterile gowns, sterile gloves, sterile drape, hand hygiene and skin antiseptic. A timeout was performed prior to the initiation of the procedure. The patient was initially positioned prone. The left popliteal vein was interrogated with ultrasound and found to be widely patent. An image was obtained and stored for the medical record. Local anesthesia was attained by infiltration with 1% lidocaine. A small dermatotomy was made. Under real-time sonographic guidance, the vessel was punctured with a 21 gauge micropuncture needle. Using  standard technique, the initial micro needle was exchanged over a 0.018 micro wire for a transitional 4 [place] micro sheath. The micro sheath was then exchanged over a 0.035 wire for a 45 cm 6 [place] braided Arrow sheath. A left lower extremity venogram was performed. The femoral vein is completely occluded in the thigh. The popliteal vein  remains patent. 6 mg tPA was laced throughout the thrombus within the common and femoral veins. The patient was repositioned supine and intubated. The right common femoral was interrogated with ultrasound and found to be patent with changes of chronic DVT. An image was obtained and stored for the medical record. Local anesthesia was attained by infiltration with 1% lidocaine. A small dermatotomy was made. Under real-time sonographic guidance, the vessel was punctured with a 21 gauge micropuncture needle. Using standard technique, the initial micro needle was exchanged over a 0.018 micro wire for a transitional 4 [place] micro sheath. The micro sheath was then exchanged over a 0.035 wire for a 6 French vascular sheath. Right lower extremity venography was performed. The common femoral and external iliac veins are patent. 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 All venous drainage is via parallel spinous collaterals. An angled catheter and glidewire were introduced through the sheath. With some manipulation, the catheter was successfully advanced into the suprarenal IVC. An inferior vena cavagram was performed confirming patency of both renal veins and the super renal IVC. A superstiff Amplatz wire was advanced into the right heart. A 10 x 40 mm Conquest balloon was introduced over the wire the occluded segment of the common iliac vein was angioplastied. Repeat venography demonstrates recreation of a small vascular channel. A 10 x 40 mm Conquest balloon was then selected and introduced over the wire. Venography demonstrates successful recanalization of the common iliac vein. There is flow through the IVC. 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 The 6 French sheath was exchanged in the soft tissue tract dilated to 24 [place]. A 26 French Gore dry seal sheath was then advanced over the wire and positioned in the right common iliac vein. The right internal  jugular vein was interrogated with ultrasound and found to be widely patent. An image was obtained and stored for the medical record. Local anesthesia was attained by infiltration with 1% lidocaine. A small dermatotomy was made. Under real-time sonographic guidance, the vessel was punctured with a 21 gauge micropuncture needle. Using standard technique, the initial micro needle was exchanged over a 0.018 micro wire for a transitional 4 [place] micro sheath. The micro sheath was then exchanged over a 0.035 wire for a 6 French vascular sheath. A superstiff Amplatz wire was advanced into the suprarenal IVC. The skin tract was then serially dilated and an 82 French reperfusion catheter was advanced and position with the tip at the cavoatrial junction. At this time, the patient was fully heparinized until the ACT level was greater than 300 and the AngioVac circuit was established. The angio [facility] device was advanced through the 52 French sheath from the right common femoral approach and suction thrombectomy was performed throughout the infrarenal inferior vena cava. Working from the popliteal access, additional left lower extremity venography images were performed focusing on the iliac and common femoral vasculature. Thrombus is present within the common femoral vein and the external iliac vein. The left common iliac vein is completely occluded similar to the other side. Attempts were made to pass through the occluded left common iliac vein from below.  This was unsuccessful. Therefore, a second right internal jugular venous access was required. The right internal jugular vein was was again interrogated with ultrasound and found to be widely patent. An image was obtained and stored for the medical record. Local anesthesia was attained by infiltration with 1% lidocaine. A small dermatotomy was made. Under real-time sonographic guidance, the vessel was punctured with a 21 gauge micropuncture needle. Using standard technique,  the initial micro needle was exchanged over a 0.018 micro wire for a transitional 4 [place] micro sheath. The micro sheath was then exchanged over a 0.035 wire for a 6 French vascular sheath. An angled catheter and glidewire were advanced through the sheath, through the IVC filter, through the infrarenal IVC and the catheter successfully navigated across the chronically occluded left common iliac vein. The glidewire was then exchanged for an exchange length Glidewire and snared from the femoral vein in the midthigh and brought through the popliteal sheath access resulting in successful through and through access from the right internal jugular vein through the left popliteal vein. A catheter was then advanced over the wire into the femoral vein and the glidewire exchanged for a superstiff Amplatz wire. At this time, another cavagram was performed demonstrating successful debulking of the caval thrombus. At this point it was decided to proceed with removal of the IVC filter. Over the superstiff Amplatz wire from the right jugular vein approach, the skin tract was dilated and a second 26 [place] Gore dry seal sheath was advanced and positioned in the suprarenal IVC. A Bard IVC filter retrieval set was then advanced coaxially through this device next to the Amplatz wire. The Bard [place] IVC filter was successfully retrieved using standard technique. Additional thrombectomy was then performed using the angio VAC device. This yielded significant material. Repeat vena cavagram demonstrates complete removal of thrombus from the inferior vena cava. Attention was next turned to the occluded left common iliac artery. The perfusion circuit was reversed and the reperfusion catheter was placed in the right femoral sheath and the angio back device was advanced through the right IJ sheath. Suction thrombectomy was performed in the right common iliac vein. Working from the popliteal access, angioplasty was performed of the common  femoral, external iliac and common iliac veins and thrombus was pushed into the AngioVac. There appears to be a significant stenosis at the transition from the common iliac to the external iliac vein. This was resistant to initial angioplasty, so a 14 x 60 mm wall stent was selected, placed across the lesion and deployed. The wall stent was then post dilated to 12 mm using the 12 by 40 Conquest balloon. Catheter venography demonstrates significant near occlusive thrombus throughout the common femoral vein, and femoral vein in the thigh. Therefore, the decision was made to proceed with additional thrombectomy from the popliteal access using the [facility]. The 6 French left popliteal sheath was exchanged over the wire for a 9 French sheath. The is a long take catheter was advanced over the wire and real lytic thrombectomy performed throughout the femoral, common femoral and iliac veins. Follow-up angiography demonstrates significant improvement with restoration of in-line flow. There is a significant residual stenosis at the common femoral vein. This was angioplastied to 12 mm using the 12 x 40 mm Conquest balloon. On the final angiographic runs, there is successful restoration of in-line flow from the popliteal access through the IVC as well as successful recanalization of both iliac veins. All wires and devices were removed. Once  the patient's ACT drop below 200, the sheaths were removed. Hemostasis was attained with a combination of manual pressure and pursestring sutures at both 26 [place] sheath sites. 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 Significant stenosis at the transition from the left common to the left external iliac vein 3. 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 Successful recanalization and angioplasty of the chronically occluded right common iliac vein 2. Successful thrombolysis/thrombectomy of the inferior vena cava, left-sided  iliac veins and left lower extremity deep venous system to the level of the popliteal vein. 3. Stent assisted angioplasty of a flow limiting stenosis at the interface of the left common and external iliac veins. 4. Angioplasty of a stenotic segment of the left common femoral vein. 5. The IVC filter was retrieved.  No additional filter was placed. PLAN: 1. Continue full-dose heparin and transitioned to weight based Lovenox prior to discharge. 2. Progress toward ambulation as soon as clinically possible. 3. Compression garment or Ace wraps to the left lower extremity if needed to facilitate edema reduction. 4. Follow-up with Dr. Laurence Ferrari in interventional radiology clinic in 2 weeks. 5. If patient demonstrates any evidence of bleeding and needs to come off of his anticoagulation in the future, he must have an IVC filter replaced. Signed, Criselda Peaches, MD [facility] Radiology Electronically Signed   By: Jacqulynn Cadet M.D.   On: 01/07/2018 10:46   Ir US Guide Vasc Access Left  Result Date: 01/07/2018 INDICATION: 57 year-old Jeff [name] with metastatic colon cancer an acute severe iliocaval and left lower extremity DVT. He presents for thrombectomy. EXAM: 1. Ultrasound-guided vascular access left popliteal vein 2. Ultrasound-guided access right common femoral vein 3. Ultrasound-guided access right internal jugular vein 4. Second ultrasound-guided access right internal jugular vein 5. Left lower extremity venogram 6. Right lower extremity venogram 7. Inferior vena cavogram 8. Combined pharmacomechanical thrombolysis/thrombectomy using tPA, the AngioVac device, and the Angiojet device 9. IVC filter retrieval 10. Recanalization and balloon angioplasty of the right common iliac vein 11. Recanalization and angioplasty with subsequent stent placement in the left iliac vein 12. Balloon angioplasty of the left common femoral and femoral veins Operating Physicians: Corrie Mckusick, DO and Jacqulynn Cadet, MD COMPARISON:  CT scan 12/30/2017 MEDICATIONS: 12 mg tPA administered directly into thrombus throughout the course of the procedure ANESTHESIA/SEDATION: General anesthesia provided by the anesthesiology service FLUOROSCOPY TIME:  Fluoroscopy Time: 67 minutes 6 seconds (816.6 mGy). COMPLICATIONS: None immediate. TECHNIQUE: Informed written consent was obtained from the patient after a thorough discussion of the procedural risks, benefits and alternatives. All questions were addressed. Maximal Sterile Barrier Technique was utilized including caps, mask, sterile gowns, sterile gloves, sterile drape, hand hygiene and skin antiseptic. A timeout was performed prior to the initiation of the procedure. The patient was initially positioned prone. The left popliteal vein was interrogated with ultrasound and found to be widely patent. An image was obtained and stored for the medical record. Local anesthesia was attained by infiltration with 1% lidocaine. A small dermatotomy was made. Under real-time sonographic guidance, the vessel was punctured with a 21 gauge micropuncture needle. Using standard technique, the initial micro needle was exchanged over a 0.018 micro wire for a transitional 4 [place] micro sheath. The micro sheath was then exchanged over a 0.035 wire for a 45 cm 6 [place] braided Arrow sheath. A left lower extremity venogram was performed. The femoral vein is completely occluded in the thigh.  The popliteal vein remains patent. 6 mg tPA was laced throughout the thrombus within the common and femoral veins. The patient was repositioned supine and intubated. The right common femoral was interrogated with ultrasound and found to be patent with changes of chronic DVT. An image was obtained and stored for the medical record. Local anesthesia was attained by infiltration with 1% lidocaine. A small dermatotomy was made. Under real-time sonographic guidance, the vessel was punctured with a  21 gauge micropuncture needle. Using standard technique, the initial micro needle was exchanged over a 0.018 micro wire for a transitional 4 [place] micro sheath. The micro sheath was then exchanged over a 0.035 wire for a 6 French vascular sheath. Right lower extremity venography was performed. The common femoral and external iliac veins are patent. 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 All venous drainage is via parallel spinous collaterals. An angled catheter and glidewire were introduced through the sheath. With some manipulation, the catheter was successfully advanced into the suprarenal IVC. An inferior vena cavagram was performed confirming patency of both renal veins and the super renal IVC. A superstiff Amplatz wire was advanced into the right heart. A 10 x 40 mm Conquest balloon was introduced over the wire the occluded segment of the common iliac vein was angioplastied. Repeat venography demonstrates recreation of a small vascular channel. A 10 x 40 mm Conquest balloon was then selected and introduced over the wire. Venography demonstrates successful recanalization of the common iliac vein. There is flow through the IVC. 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 The 6 French sheath was exchanged in the soft tissue tract dilated to 24 [place]. A 26 French Gore dry seal sheath was then advanced over the wire and positioned in the right common iliac vein. The right internal jugular vein was interrogated with ultrasound and found to be widely patent. An image was obtained and stored for the medical record. Local anesthesia was attained by infiltration with 1% lidocaine. A small dermatotomy was made. Under real-time sonographic guidance, the vessel was punctured with a 21 gauge micropuncture needle. Using standard technique, the initial micro needle was exchanged over a 0.018 micro wire for a transitional 4 [place] micro sheath. The micro sheath was  then exchanged over a 0.035 wire for a 6 French vascular sheath. A superstiff Amplatz wire was advanced into the suprarenal IVC. The skin tract was then serially dilated and an Jeff French reperfusion catheter was advanced and position with the tip at the cavoatrial junction. At this time, the patient was fully heparinized until the ACT level was greater than 300 and the AngioVac circuit was established. The angio [facility] device was advanced through the 30 French sheath from the right common femoral approach and suction thrombectomy was performed throughout the infrarenal inferior vena cava. Working from the popliteal access, additional left lower extremity venography images were performed focusing on the iliac and common femoral vasculature. Thrombus is present within the common femoral vein and the external iliac vein. The left common iliac vein is completely occluded similar to the other side. Attempts were made to pass through the occluded left common iliac vein from below. This was unsuccessful. Therefore, a second right internal jugular venous access was required. The right internal jugular vein was was again interrogated with ultrasound and found to be widely patent. An image was obtained and stored for the medical record. Local anesthesia was attained by infiltration with 1% lidocaine. A small dermatotomy was made. Under  real-time sonographic guidance, the vessel was punctured with a 21 gauge micropuncture needle. Using standard technique, the initial micro needle was exchanged over a 0.018 micro wire for a transitional 4 [place] micro sheath. The micro sheath was then exchanged over a 0.035 wire for a 6 French vascular sheath. An angled catheter and glidewire were advanced through the sheath, through the IVC filter, through the infrarenal IVC and the catheter successfully navigated across the chronically occluded left common iliac vein. The glidewire was then exchanged for an exchange length Glidewire and snared  from the femoral vein in the midthigh and brought through the popliteal sheath access resulting in successful through and through access from the right internal jugular vein through the left popliteal vein. A catheter was then advanced over the wire into the femoral vein and the glidewire exchanged for a superstiff Amplatz wire. At this time, another cavagram was performed demonstrating successful debulking of the caval thrombus. At this point it was decided to proceed with removal of the IVC filter. Over the superstiff Amplatz wire from the right jugular vein approach, the skin tract was dilated and a second 26 [place] Gore dry seal sheath was advanced and positioned in the suprarenal IVC. A Bard IVC filter retrieval set was then advanced coaxially through this device next to the Amplatz wire. The Bard [place] IVC filter was successfully retrieved using standard technique. Additional thrombectomy was then performed using the angio VAC device. This yielded significant material. Repeat vena cavagram demonstrates complete removal of thrombus from the inferior vena cava. Attention was next turned to the occluded left common iliac artery. The perfusion circuit was reversed and the reperfusion catheter was placed in the right femoral sheath and the angio back device was advanced through the right IJ sheath. Suction thrombectomy was performed in the right common iliac vein. Working from the popliteal access, angioplasty was performed of the common femoral, external iliac and common iliac veins and thrombus was pushed into the AngioVac. There appears to be a significant stenosis at the transition from the common iliac to the external iliac vein. This was resistant to initial angioplasty, so a 14 x 60 mm wall stent was selected, placed across the lesion and deployed. The wall stent was then post dilated to 12 mm using the 12 by 40 Conquest balloon. Catheter venography demonstrates significant near occlusive thrombus  throughout the common femoral vein, and femoral vein in the thigh. Therefore, the decision was made to proceed with additional thrombectomy from the popliteal access using the [facility]. The 6 French left popliteal sheath was exchanged over the wire for a 9 French sheath. The is a long take catheter was advanced over the wire and real lytic thrombectomy performed throughout the femoral, common femoral and iliac veins. Follow-up angiography demonstrates significant improvement with restoration of in-line flow. There is a significant residual stenosis at the common femoral vein. This was angioplastied to 12 mm using the 12 x 40 mm Conquest balloon. On the final angiographic runs, there is successful restoration of in-line flow from the popliteal access through the IVC as well as successful recanalization of both iliac veins. All wires and devices were removed. Once the patient's ACT drop below 200, the sheaths were removed. Hemostasis was attained with a combination of manual pressure and pursestring sutures at both 26 [place] sheath sites. 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 Significant stenosis at the transition from the left common to the left external iliac vein 3.  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 Successful recanalization and angioplasty of the chronically occluded right common iliac vein 2. Successful thrombolysis/thrombectomy of the inferior vena cava, left-sided iliac veins and left lower extremity deep venous system to the level of the popliteal vein. 3. Stent assisted angioplasty of a flow limiting stenosis at the interface of the left common and external iliac veins. 4. Angioplasty of a stenotic segment of the left common femoral vein. 5. The IVC filter was retrieved.  No additional filter was placed. PLAN: 1. Continue full-dose heparin and transitioned to weight based Lovenox prior to discharge. 2. Progress toward ambulation  as soon as clinically possible. 3. Compression garment or Ace wraps to the left lower extremity if needed to facilitate edema reduction. 4. Follow-up with Dr. Laurence Ferrari in interventional radiology clinic in 2 weeks. 5. If patient demonstrates any evidence of bleeding and needs to come off of his anticoagulation in the future, he must have an IVC filter replaced. Signed, Criselda Peaches, MD [facility] Radiology Electronically Signed   By: Jacqulynn Cadet M.D.   On: 01/07/2018 10:46   Ir US Guide Vasc Access Right  Result Date: 01/07/2018 INDICATION: 57 year-old Jeff [name] with metastatic colon cancer an acute severe iliocaval and left lower extremity DVT. He presents for thrombectomy. EXAM: 1. Ultrasound-guided vascular access left popliteal vein 2. Ultrasound-guided access right common femoral vein 3. Ultrasound-guided access right internal jugular vein 4. Second ultrasound-guided access right internal jugular vein 5. Left lower extremity venogram 6. Right lower extremity venogram 7. Inferior vena cavogram 8. Combined pharmacomechanical thrombolysis/thrombectomy using tPA, the AngioVac device, and the Angiojet device 9. IVC filter retrieval 10. Recanalization and balloon angioplasty of the right common iliac vein 11. Recanalization and angioplasty with subsequent stent placement in the left iliac vein 12. Balloon angioplasty of the left common femoral and femoral veins Operating Physicians: Corrie Mckusick, DO and Jacqulynn Cadet, MD COMPARISON:  CT scan 12/30/2017 MEDICATIONS: 12 mg tPA administered directly into thrombus throughout the course of the procedure ANESTHESIA/SEDATION: General anesthesia provided by the anesthesiology service FLUOROSCOPY TIME:  Fluoroscopy Time: 67 minutes 6 seconds (816.6 mGy). COMPLICATIONS: None immediate. TECHNIQUE: Informed written consent was obtained from the patient after a thorough discussion of the procedural risks, benefits  and alternatives. All questions were addressed. Maximal Sterile Barrier Technique was utilized including caps, mask, sterile gowns, sterile gloves, sterile drape, hand hygiene and skin antiseptic. A timeout was performed prior to the initiation of the procedure. The patient was initially positioned prone. The left popliteal vein was interrogated with ultrasound and found to be widely patent. An image was obtained and stored for the medical record. Local anesthesia was attained by infiltration with 1% lidocaine. A small dermatotomy was made. Under real-time sonographic guidance, the vessel was punctured with a 21 gauge micropuncture needle. Using standard technique, the initial micro needle was exchanged over a 0.018 micro wire for a transitional 4 [place] micro sheath. The micro sheath was then exchanged over a 0.035 wire for a 45 cm 6 [place] braided Arrow sheath. A left lower extremity venogram was performed. The femoral vein is completely occluded in the thigh. The popliteal vein remains patent. 6 mg tPA was laced throughout the thrombus within the common and femoral veins. The patient was repositioned supine and intubated. The right common femoral was interrogated with ultrasound and found to be patent with changes of chronic DVT. An image was obtained and stored for the medical record. Local  anesthesia was attained by infiltration with 1% lidocaine. A small dermatotomy was made. Under real-time sonographic guidance, the vessel was punctured with a 21 gauge micropuncture needle. Using standard technique, the initial micro needle was exchanged over a 0.018 micro wire for a transitional 4 [place] micro sheath. The micro sheath was then exchanged over a 0.035 wire for a 6 French vascular sheath. Right lower extremity venography was performed. The common femoral and external iliac veins are patent. 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 All venous drainage is via parallel spinous collaterals. An angled catheter  and glidewire were introduced through the sheath. With some manipulation, the catheter was successfully advanced into the suprarenal IVC. An inferior vena cavagram was performed confirming patency of both renal veins and the super renal IVC. A superstiff Amplatz wire was advanced into the right heart. A 10 x 40 mm Conquest balloon was introduced over the wire the occluded segment of the common iliac vein was angioplastied. Repeat venography demonstrates recreation of a small vascular channel. A 10 x 40 mm Conquest balloon was then selected and introduced over the wire. Venography demonstrates successful recanalization of the common iliac vein. There is flow through the IVC. 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 The 6 French sheath was exchanged in the soft tissue tract dilated to 24 [place]. A 26 French Gore dry seal sheath was then advanced over the wire and positioned in the right common iliac vein. The right internal jugular vein was interrogated with ultrasound and found to be widely patent. An image was obtained and stored for the medical record. Local anesthesia was attained by infiltration with 1% lidocaine. A small dermatotomy was made. Under real-time sonographic guidance, the vessel was punctured with a 21 gauge micropuncture needle. Using standard technique, the initial micro needle was exchanged over a 0.018 micro wire for a transitional 4 [place] micro sheath. The micro sheath was then exchanged over a 0.035 wire for a 6 French vascular sheath. A superstiff Amplatz wire was advanced into the suprarenal IVC. The skin tract was then serially dilated and an 62 French reperfusion catheter was advanced and position with the tip at the cavoatrial junction. At this time, the patient was fully heparinized until the ACT level was greater than 300 and the AngioVac circuit was established. The angio [facility] device was advanced through the Jeff French sheath from the  right common femoral approach and suction thrombectomy was performed throughout the infrarenal inferior vena cava. Working from the popliteal access, additional left lower extremity venography images were performed focusing on the iliac and common femoral vasculature. Thrombus is present within the common femoral vein and the external iliac vein. The left common iliac vein is completely occluded similar to the other side. Attempts were made to pass through the occluded left common iliac vein from below. This was unsuccessful. Therefore, a second right internal jugular venous access was required. The right internal jugular vein was was again interrogated with ultrasound and found to be widely patent. An image was obtained and stored for the medical record. Local anesthesia was attained by infiltration with 1% lidocaine. A small dermatotomy was made. Under real-time sonographic guidance, the vessel was punctured with a 21 gauge micropuncture needle. Using standard technique, the initial micro needle was exchanged over a 0.018 micro wire for a transitional 4 [place] micro sheath. The micro sheath was then exchanged over a 0.035 wire for a 6 French vascular sheath. An angled catheter and glidewire  were advanced through the sheath, through the IVC filter, through the infrarenal IVC and the catheter successfully navigated across the chronically occluded left common iliac vein. The glidewire was then exchanged for an exchange length Glidewire and snared from the femoral vein in the midthigh and brought through the popliteal sheath access resulting in successful through and through access from the right internal jugular vein through the left popliteal vein. A catheter was then advanced over the wire into the femoral vein and the glidewire exchanged for a superstiff Amplatz wire. At this time, another cavagram was performed demonstrating successful debulking of the caval thrombus. At this point it was decided to proceed with  removal of the IVC filter. Over the superstiff Amplatz wire from the right jugular vein approach, the skin tract was dilated and a second 26 [place] Gore dry seal sheath was advanced and positioned in the suprarenal IVC. A Bard IVC filter retrieval set was then advanced coaxially through this device next to the Amplatz wire. The Bard [place] IVC filter was successfully retrieved using standard technique. Additional thrombectomy was then performed using the angio VAC device. This yielded significant material. Repeat vena cavagram demonstrates complete removal of thrombus from the inferior vena cava. Attention was next turned to the occluded left common iliac artery. The perfusion circuit was reversed and the reperfusion catheter was placed in the right femoral sheath and the angio back device was advanced through the right IJ sheath. Suction thrombectomy was performed in the right common iliac vein. Working from the popliteal access, angioplasty was performed of the common femoral, external iliac and common iliac veins and thrombus was pushed into the AngioVac. There appears to be a significant stenosis at the transition from the common iliac to the external iliac vein. This was resistant to initial angioplasty, so a 14 x 60 mm wall stent was selected, placed across the lesion and deployed. The wall stent was then post dilated to 12 mm using the 12 by 40 Conquest balloon. Catheter venography demonstrates significant near occlusive thrombus throughout the common femoral vein, and femoral vein in the thigh. Therefore, the decision was made to proceed with additional thrombectomy from the popliteal access using the [facility]. The 6 French left popliteal sheath was exchanged over the wire for a 9 French sheath. The is a long take catheter was advanced over the wire and real lytic thrombectomy performed throughout the femoral, common femoral and iliac veins. Follow-up angiography demonstrates  significant improvement with restoration of in-line flow. There is a significant residual stenosis at the common femoral vein. This was angioplastied to 12 mm using the 12 x 40 mm Conquest balloon. On the final angiographic runs, there is successful restoration of in-line flow from the popliteal access through the IVC as well as successful recanalization of both iliac veins. All wires and devices were removed. Once the patient's ACT drop below 200, the sheaths were removed. Hemostasis was attained with a combination of manual pressure and pursestring sutures at both 26 [place] sheath sites. 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 Significant stenosis at the transition from the left common to the left external iliac vein 3. 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 Successful recanalization and angioplasty of the chronically occluded right common iliac vein 2. Successful thrombolysis/thrombectomy of the inferior vena cava, left-sided iliac veins and left lower extremity deep venous system to the level of the popliteal vein. 3. Stent assisted angioplasty of a flow limiting  stenosis at the interface of the left common and external iliac veins. 4. Angioplasty of a stenotic segment of the left common femoral vein. 5. The IVC filter was retrieved.  No additional filter was placed. PLAN: 1. Continue full-dose heparin and transitioned to weight based Lovenox prior to discharge. 2. Progress toward ambulation as soon as clinically possible. 3. Compression garment or Ace wraps to the left lower extremity if needed to facilitate edema reduction. 4. Follow-up with Dr. Laurence Ferrari in interventional radiology clinic in 2 weeks. 5. If patient demonstrates any evidence of bleeding and needs to come off of his anticoagulation in the future, he must have an IVC filter replaced. Signed, Criselda Peaches, MD [facility] Radiology  Electronically Signed   By: Jacqulynn Cadet M.D.   On: 01/07/2018 10:46   Ir US Guide Vasc Access Right  Result Date: 01/07/2018 INDICATION: Jeff Jeff [name] with metastatic colon cancer an acute severe iliocaval and left lower extremity DVT. He presents for thrombectomy. EXAM: 1. Ultrasound-guided vascular access left popliteal vein 2. Ultrasound-guided access right common femoral vein 3. Ultrasound-guided access right internal jugular vein 4. Second ultrasound-guided access right internal jugular vein 5. Left lower extremity venogram 6. Right lower extremity venogram 7. Inferior vena cavogram 8. Combined pharmacomechanical thrombolysis/thrombectomy using tPA, the AngioVac device, and the Angiojet device 9. IVC filter retrieval 10. Recanalization and balloon angioplasty of the right common iliac vein 11. Recanalization and angioplasty with subsequent stent placement in the left iliac vein 12. Balloon angioplasty of the left common femoral and femoral veins Operating Physicians: Corrie Mckusick, DO and Jacqulynn Cadet, MD COMPARISON:  CT scan 12/30/2017 MEDICATIONS: 12 mg tPA administered directly into thrombus throughout the course of the procedure ANESTHESIA/SEDATION: General anesthesia provided by the anesthesiology service FLUOROSCOPY TIME:  Fluoroscopy Time: 67 minutes 6 seconds (816.6 mGy). COMPLICATIONS: None immediate. TECHNIQUE: Informed written consent was obtained from the patient after a thorough discussion of the procedural risks, benefits and alternatives. All questions were addressed. Maximal Sterile Barrier Technique was utilized including caps, mask, sterile gowns, sterile gloves, sterile drape, hand hygiene and skin antiseptic. A timeout was performed prior to the initiation of the procedure. The patient was initially positioned prone. The left popliteal vein was interrogated with ultrasound and found to be widely patent. An image was obtained and stored for the medical record. Local anesthesia  was attained by infiltration with 1% lidocaine. A small dermatotomy was made. Under real-time sonographic guidance, the vessel was punctured with a 21 gauge micropuncture needle. Using standard technique, the initial micro needle was exchanged over a 0.018 micro wire for a transitional 4 [place] micro sheath. The micro sheath was then exchanged over a 0.035 wire for a 45 cm 6 [place] braided Arrow sheath. A left lower extremity venogram was performed. The femoral vein is completely occluded in the thigh. The popliteal vein remains patent. 6 mg tPA was laced throughout the thrombus within the common and femoral veins. The patient was repositioned supine and intubated. The right common femoral was interrogated with ultrasound and found to be patent with changes of chronic DVT. An image was obtained and stored for the medical record. Local anesthesia was attained by infiltration with 1% lidocaine. A small dermatotomy was made. Under real-time sonographic guidance, the vessel was punctured with a 21 gauge micropuncture needle. Using standard technique, the initial micro needle was exchanged over a 0.018 micro wire for a transitional 4 [place] micro sheath. The micro sheath was then exchanged over  a 0.035 wire for a 6 [place] vascular sheath. Right lower extremity venography was performed. The common femoral and external iliac veins are patent. 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 All venous drainage is via parallel spinous collaterals. An angled catheter and glidewire were introduced through the sheath. With some manipulation, the catheter was successfully advanced into the suprarenal IVC. An inferior vena cavagram was performed confirming patency of both renal veins and the super renal IVC. A superstiff Amplatz wire was advanced into the right heart. A 10 x 40 mm Conquest balloon was introduced over the wire the occluded segment of the common iliac vein was angioplastied. Repeat venography demonstrates recreation  of a small vascular channel. A 10 x 40 mm Conquest balloon was then selected and introduced over the wire. Venography demonstrates successful recanalization of the common iliac vein. There is flow through the IVC. 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 The 6 French sheath was exchanged in the soft tissue tract dilated to 24 [place]. A 26 [place] Gore dry seal sheath was then advanced over the wire and positioned in the right common iliac vein. The right internal jugular vein was interrogated with ultrasound and found to be widely patent. An image was obtained and stored for the medical record. Local anesthesia was attained by infiltration with 1% lidocaine. A small dermatotomy was made. Under real-time sonographic guidance, the vessel was punctured with a 21 gauge micropuncture needle. Using standard technique, the initial micro needle was exchanged over a 0.018 micro wire for a transitional 4 [place] micro sheath. The micro sheath was then exchanged over a 0.035 wire for a 6 French vascular sheath. A superstiff Amplatz wire was advanced into the suprarenal IVC. The skin tract was then serially dilated and an 34 French reperfusion catheter was advanced and position with the tip at the cavoatrial junction. At this time, the patient was fully heparinized until the ACT level was greater than 300 and the AngioVac circuit was established. The angio [facility] device was advanced through the 106 [place] sheath from the right common femoral approach and suction thrombectomy was performed throughout the infrarenal inferior vena cava. Working from the popliteal access, additional left lower extremity venography images were performed focusing on the iliac and common femoral vasculature. Thrombus is present within the common femoral vein and the external iliac vein. The left common iliac vein is completely occluded similar to the other side. Attempts were made to pass through the  occluded left common iliac vein from below. This was unsuccessful. Therefore, a second right internal jugular venous access was required. The right internal jugular vein was was again interrogated with ultrasound and found to be widely patent. An image was obtained and stored for the medical record. Local anesthesia was attained by infiltration with 1% lidocaine. A small dermatotomy was made. Under real-time sonographic guidance, the vessel was punctured with a 21 gauge micropuncture needle. Using standard technique, the initial micro needle was exchanged over a 0.018 micro wire for a transitional 4 [place] micro sheath. The micro sheath was then exchanged over a 0.035 wire for a 6 French vascular sheath. An angled catheter and glidewire were advanced through the sheath, through the IVC filter, through the infrarenal IVC and the catheter successfully navigated across the chronically occluded left common iliac vein. The glidewire was then exchanged for an exchange length Glidewire and snared from the femoral vein in the midthigh and brought through the popliteal sheath access resulting in successful  through and through access from the right internal jugular vein through the left popliteal vein. A catheter was then advanced over the wire into the femoral vein and the glidewire exchanged for a superstiff Amplatz wire. At this time, another cavagram was performed demonstrating successful debulking of the caval thrombus. At this point it was decided to proceed with removal of the IVC filter. Over the superstiff Amplatz wire from the right jugular vein approach, the skin tract was dilated and a second 26 [place] Gore dry seal sheath was advanced and positioned in the suprarenal IVC. A Bard IVC filter retrieval set was then advanced coaxially through this device next to the Amplatz wire. The Bard [place] IVC filter was successfully retrieved using standard technique. Additional thrombectomy was then performed using the angio  VAC device. This yielded significant material. Repeat vena cavagram demonstrates complete removal of thrombus from the inferior vena cava. Attention was next turned to the occluded left common iliac artery. The perfusion circuit was reversed and the reperfusion catheter was placed in the right femoral sheath and the angio back device was advanced through the right IJ sheath. Suction thrombectomy was performed in the right common iliac vein. Working from the popliteal access, angioplasty was performed of the common femoral, external iliac and common iliac veins and thrombus was pushed into the AngioVac. There appears to be a significant stenosis at the transition from the common iliac to the external iliac vein. This was resistant to initial angioplasty, so a 14 x 60 mm wall stent was selected, placed across the lesion and deployed. The wall stent was then post dilated to 12 mm using the 12 by 40 Conquest balloon. Catheter venography demonstrates significant near occlusive thrombus throughout the common femoral vein, and femoral vein in the thigh. Therefore, the decision was made to proceed with additional thrombectomy from the popliteal access using the [facility]. The 6 French left popliteal sheath was exchanged over the wire for a 9 French sheath. The is a long take catheter was advanced over the wire and real lytic thrombectomy performed throughout the femoral, common femoral and iliac veins. Follow-up angiography demonstrates significant improvement with restoration of in-line flow. There is a significant residual stenosis at the common femoral vein. This was angioplastied to 12 mm using the 12 x 40 mm Conquest balloon. On the final angiographic runs, there is successful restoration of in-line flow from the popliteal access through the IVC as well as successful recanalization of both iliac veins. All wires and devices were removed. Once the patient's ACT drop below 200, the sheaths were  removed. Hemostasis was attained with a combination of manual pressure and pursestring sutures at both 26 [place] sheath sites. 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 Significant stenosis at the transition from the left common to the left external iliac vein 3. 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 Successful recanalization and angioplasty of the chronically occluded right common iliac vein 2. Successful thrombolysis/thrombectomy of the inferior vena cava, left-sided iliac veins and left lower extremity deep venous system to the level of the popliteal vein. 3. Stent assisted angioplasty of a flow limiting stenosis at the interface of the left common and external iliac veins. 4. Angioplasty of a stenotic segment of the left common femoral vein. 5. The IVC filter was retrieved.  No additional filter was placed. PLAN: 1. Continue full-dose heparin and transitioned to weight based Lovenox prior to discharge. 2. Progress toward ambulation as  soon as clinically possible. 3. Compression garment or Ace wraps to the left lower extremity if needed to facilitate edema reduction. 4. Follow-up with Dr. Laurence Ferrari in interventional radiology clinic in 2 weeks. 5. If patient demonstrates any evidence of bleeding and needs to come off of his anticoagulation in the future, he must have an IVC filter replaced. Signed, Criselda Peaches, MD [facility] Radiology Electronically Signed   By: Jacqulynn Cadet M.D.   On: 01/07/2018 10:46   Dg Chest Port 1 View  Result Date: 01/08/2018 CLINICAL DATA:  Fever EXAM: PORTABLE CHEST 1 VIEW COMPARISON:  01/06/2018, 08/06/2017 chest x-ray FINDINGS: Right-sided central venous port tip overlies the SVC. No consolidation or effusion. Normal heart size. Negative for a pneumothorax. Endotracheal tube has been removed. IMPRESSION: No active disease. Electronically Signed   By: Donavan Foil M.D.    On: 01/08/2018 03:05   Dg Chest Port 1 View  Result Date: 01/07/2018 CLINICAL DATA:  Respiratory failure EXAM: PORTABLE CHEST 1 VIEW COMPARISON:  None. FINDINGS: The heart size and mediastinal contours are within normal limits. Both lungs are clear. Endotracheal tube tip is 4.3 cm above the carina in satisfactory position. Port catheter tip is noted in the distal SVC. No effusion or pneumothorax. The visualized skeletal structures are unremarkable. IMPRESSION: Satisfactory support line and tubes.  Clear lungs. Electronically Signed   By: Ashley Royalty M.D.   On: 01/07/2018 03:30   Ir Pta Venous Except Dialysis Circuit  Result Date: 01/07/2018 INDICATION: 57 year-old Jeff [name] with metastatic colon cancer an acute severe iliocaval and left lower extremity DVT. He presents for thrombectomy. EXAM: 1. Ultrasound-guided vascular access left popliteal vein 2. Ultrasound-guided access right common femoral vein 3. Ultrasound-guided access right internal jugular vein 4. Second ultrasound-guided access right internal jugular vein 5. Left lower extremity venogram 6. Right lower extremity venogram 7. Inferior vena cavogram 8. Combined pharmacomechanical thrombolysis/thrombectomy using tPA, the AngioVac device, and the Angiojet device 9. IVC filter retrieval 10. Recanalization and balloon angioplasty of the right common iliac vein 11. Recanalization and angioplasty with subsequent stent placement in the left iliac vein 12. Balloon angioplasty of the left common femoral and femoral veins Operating Physicians: Corrie Mckusick, DO and Jacqulynn Cadet, MD COMPARISON:  CT scan 12/30/2017 MEDICATIONS: 12 mg tPA administered directly into thrombus throughout the course of the procedure ANESTHESIA/SEDATION: General anesthesia provided by the anesthesiology service FLUOROSCOPY TIME:  Fluoroscopy Time: 67 minutes 6 seconds (816.6 mGy). COMPLICATIONS: None immediate. TECHNIQUE: Informed written consent was obtained from the patient after a  thorough discussion of the procedural risks, benefits and alternatives. All questions were addressed. Maximal Sterile Barrier Technique was utilized including caps, mask, sterile gowns, sterile gloves, sterile drape, hand hygiene and skin antiseptic. A timeout was performed prior to the initiation of the procedure. The patient was initially positioned prone. The left popliteal vein was interrogated with ultrasound and found to be widely patent. An image was obtained and stored for the medical record. Local anesthesia was attained by infiltration with 1% lidocaine. A small dermatotomy was made. Under real-time sonographic guidance, the vessel was punctured with a 21 gauge micropuncture needle. Using standard technique, the initial micro needle was exchanged over a 0.018 micro wire for a transitional 4 [place] micro sheath. The micro sheath was then exchanged over a 0.035 wire for a 45 cm 6 [place] braided Arrow sheath. A left lower extremity venogram was performed. The femoral vein is completely occluded in the thigh. The popliteal vein  remains patent. 6 mg tPA was laced throughout the thrombus within the common and femoral veins. The patient was repositioned supine and intubated. The right common femoral was interrogated with ultrasound and found to be patent with changes of chronic DVT. An image was obtained and stored for the medical record. Local anesthesia was attained by infiltration with 1% lidocaine. A small dermatotomy was made. Under real-time sonographic guidance, the vessel was punctured with a 21 gauge micropuncture needle. Using standard technique, the initial micro needle was exchanged over a 0.018 micro wire for a transitional 4 [place] micro sheath. The micro sheath was then exchanged over a 0.035 wire for a 6 French vascular sheath. Right lower extremity venography was performed. The common femoral and external iliac veins are patent. 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 All venous drainage  is via parallel spinous collaterals. An angled catheter and glidewire were introduced through the sheath. With some manipulation, the catheter was successfully advanced into the suprarenal IVC. An inferior vena cavagram was performed confirming patency of both renal veins and the super renal IVC. A superstiff Amplatz wire was advanced into the right heart. A 10 x 40 mm Conquest balloon was introduced over the wire the occluded segment of the common iliac vein was angioplastied. Repeat venography demonstrates recreation of a small vascular channel. A 10 x 40 mm Conquest balloon was then selected and introduced over the wire. Venography demonstrates successful recanalization of the common iliac vein. There is flow through the IVC. 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 The 6 French sheath was exchanged in the soft tissue tract dilated to 24 [place]. A 26 French Gore dry seal sheath was then advanced over the wire and positioned in the right common iliac vein. The right internal jugular vein was interrogated with ultrasound and found to be widely patent. An image was obtained and stored for the medical record. Local anesthesia was attained by infiltration with 1% lidocaine. A small dermatotomy was made. Under real-time sonographic guidance, the vessel was punctured with a 21 gauge micropuncture needle. Using standard technique, the initial micro needle was exchanged over a 0.018 micro wire for a transitional 4 [place] micro sheath. The micro sheath was then exchanged over a 0.035 wire for a 6 French vascular sheath. A superstiff Amplatz wire was advanced into the suprarenal IVC. The skin tract was then serially dilated and an 22 French reperfusion catheter was advanced and position with the tip at the cavoatrial junction. At this time, the patient was fully heparinized until the ACT level was greater than 300 and the AngioVac circuit was established. The angio [facility]  device was advanced through the 6 French sheath from the right common femoral approach and suction thrombectomy was performed throughout the infrarenal inferior vena cava. Working from the popliteal access, additional left lower extremity venography images were performed focusing on the iliac and common femoral vasculature. Thrombus is present within the common femoral vein and the external iliac vein. The left common iliac vein is completely occluded similar to the other side. Attempts were made to pass through the occluded left common iliac vein from below. This was unsuccessful. Therefore, a second right internal jugular venous access was required. The right internal jugular vein was was again interrogated with ultrasound and found to be widely patent. An image was obtained and stored for the medical record. Local anesthesia was attained by infiltration with 1% lidocaine. A small dermatotomy was made. Under real-time sonographic guidance,  the vessel was punctured with a 21 gauge micropuncture needle. Using standard technique, the initial micro needle was exchanged over a 0.018 micro wire for a transitional 4 [place] micro sheath. The micro sheath was then exchanged over a 0.035 wire for a 6 French vascular sheath. An angled catheter and glidewire were advanced through the sheath, through the IVC filter, through the infrarenal IVC and the catheter successfully navigated across the chronically occluded left common iliac vein. The glidewire was then exchanged for an exchange length Glidewire and snared from the femoral vein in the midthigh and brought through the popliteal sheath access resulting in successful through and through access from the right internal jugular vein through the left popliteal vein. A catheter was then advanced over the wire into the femoral vein and the glidewire exchanged for a superstiff Amplatz wire. At this time, another cavagram was performed demonstrating successful debulking of the  caval thrombus. At this point it was decided to proceed with removal of the IVC filter. Over the superstiff Amplatz wire from the right jugular vein approach, the skin tract was dilated and a second 26 [place] Gore dry seal sheath was advanced and positioned in the suprarenal IVC. A Bard IVC filter retrieval set was then advanced coaxially through this device next to the Amplatz wire. The Bard [place] IVC filter was successfully retrieved using standard technique. Additional thrombectomy was then performed using the angio VAC device. This yielded significant material. Repeat vena cavagram demonstrates complete removal of thrombus from the inferior vena cava. Attention was next turned to the occluded left common iliac artery. The perfusion circuit was reversed and the reperfusion catheter was placed in the right femoral sheath and the angio back device was advanced through the right IJ sheath. Suction thrombectomy was performed in the right common iliac vein. Working from the popliteal access, angioplasty was performed of the common femoral, external iliac and common iliac veins and thrombus was pushed into the AngioVac. There appears to be a significant stenosis at the transition from the common iliac to the external iliac vein. This was resistant to initial angioplasty, so a 14 x 60 mm wall stent was selected, placed across the lesion and deployed. The wall stent was then post dilated to 12 mm using the 12 by 40 Conquest balloon. Catheter venography demonstrates significant near occlusive thrombus throughout the common femoral vein, and femoral vein in the thigh. Therefore, the decision was made to proceed with additional thrombectomy from the popliteal access using the [facility]. The 6 French left popliteal sheath was exchanged over the wire for a 9 French sheath. The is a long take catheter was advanced over the wire and real lytic thrombectomy performed throughout the femoral, common  femoral and iliac veins. Follow-up angiography demonstrates significant improvement with restoration of in-line flow. There is a significant residual stenosis at the common femoral vein. This was angioplastied to 12 mm using the 12 x 40 mm Conquest balloon. On the final angiographic runs, there is successful restoration of in-line flow from the popliteal access through the IVC as well as successful recanalization of both iliac veins. All wires and devices were removed. Once the patient's ACT drop below 200, the sheaths were removed. Hemostasis was attained with a combination of manual pressure and pursestring sutures at both 26 [place] sheath sites. 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 Significant stenosis at the transition from the left common to the left external iliac vein 3. Severe ileo caval  and left lower extremity DVT IMPRESSION: 1. Successful recanalization and angioplasty of the chronically occluded right common iliac vein 2. Successful thrombolysis/thrombectomy of the inferior vena cava, left-sided iliac veins and left lower extremity deep venous system to the level of the popliteal vein. 3. Stent assisted angioplasty of a flow limiting stenosis at the interface of the left common and external iliac veins. 4. Angioplasty of a stenotic segment of the left common femoral vein. 5. The IVC filter was retrieved.  No additional filter was placed. PLAN: 1. Continue full-dose heparin and transitioned to weight based Lovenox prior to discharge. 2. Progress toward ambulation as soon as clinically possible. 3. Compression garment or Ace wraps to the left lower extremity if needed to facilitate edema reduction. 4. Follow-up with Dr. Laurence Ferrari in interventional radiology clinic in 2 weeks. 5. If patient demonstrates any evidence of bleeding and needs to come off of his anticoagulation in the future, he must have an IVC filter replaced. Signed, Criselda Peaches, MD [facility]  [facility] Radiology Electronically Signed   By: Jacqulynn Cadet M.D.   On: 01/07/2018 10:46   Ir Pta Venous Addl Except Dialysis Circuit  Result Date: 01/07/2018 INDICATION: Jeff Jeff [name] with metastatic colon cancer an acute severe iliocaval and left lower extremity DVT. He presents for thrombectomy. EXAM: 1. Ultrasound-guided vascular access left popliteal vein 2. Ultrasound-guided access right common femoral vein 3. Ultrasound-guided access right internal jugular vein 4. Second ultrasound-guided access right internal jugular vein 5. Left lower extremity venogram 6. Right lower extremity venogram 7. Inferior vena cavogram 8. Combined pharmacomechanical thrombolysis/thrombectomy using tPA, the AngioVac device, and the Angiojet device 9. IVC filter retrieval 10. Recanalization and balloon angioplasty of the right common iliac vein 11. Recanalization and angioplasty with subsequent stent placement in the left iliac vein 12. Balloon angioplasty of the left common femoral and femoral veins Operating Physicians: Corrie Mckusick, DO and Jacqulynn Cadet, MD COMPARISON:  CT scan 12/30/2017 MEDICATIONS: 12 mg tPA administered directly into thrombus throughout the course of the procedure ANESTHESIA/SEDATION: General anesthesia provided by the anesthesiology service FLUOROSCOPY TIME:  Fluoroscopy Time: 67 minutes 6 seconds (816.6 mGy). COMPLICATIONS: None immediate. TECHNIQUE: Informed written consent was obtained from the patient after a thorough discussion of the procedural risks, benefits and alternatives. All questions were addressed. Maximal Sterile Barrier Technique was utilized including caps, mask, sterile gowns, sterile gloves, sterile drape, hand hygiene and skin antiseptic. A timeout was performed prior to the initiation of the procedure. The patient was initially positioned prone. The left popliteal vein was interrogated with ultrasound and found to be widely patent. An  image was obtained and stored for the medical record. Local anesthesia was attained by infiltration with 1% lidocaine. A small dermatotomy was made. Under real-time sonographic guidance, the vessel was punctured with a 21 gauge micropuncture needle. Using standard technique, the initial micro needle was exchanged over a 0.018 micro wire for a transitional 4 [place] micro sheath. The micro sheath was then exchanged over a 0.035 wire for a 45 cm 6 [place] braided Arrow sheath. A left lower extremity venogram was performed. The femoral vein is completely occluded in the thigh. The popliteal vein remains patent. 6 mg tPA was laced throughout the thrombus within the common and femoral veins. The patient was repositioned supine and intubated. The right common femoral was interrogated with ultrasound and found to be patent with changes of chronic DVT. An image was obtained and stored for the medical record. Local anesthesia was  attained by infiltration with 1% lidocaine. A small dermatotomy was made. Under real-time sonographic guidance, the vessel was punctured with a 21 gauge micropuncture needle. Using standard technique, the initial micro needle was exchanged over a 0.018 micro wire for a transitional 4 [place] micro sheath. The micro sheath was then exchanged over a 0.035 wire for a 6 French vascular sheath. Right lower extremity venography was performed. The common femoral and external iliac veins are patent. 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 All venous drainage is via parallel spinous collaterals. An angled catheter and glidewire were introduced through the sheath. With some manipulation, the catheter was successfully advanced into the suprarenal IVC. An inferior vena cavagram was performed confirming patency of both renal veins and the super renal IVC. A superstiff Amplatz wire was advanced into the right heart. A 10 x 40 mm Conquest balloon was introduced over the wire the occluded segment of the common  iliac vein was angioplastied. Repeat venography demonstrates recreation of a small vascular channel. A 10 x 40 mm Conquest balloon was then selected and introduced over the wire. Venography demonstrates successful recanalization of the common iliac vein. There is flow through the IVC. 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 The 6 French sheath was exchanged in the soft tissue tract dilated to 24 [place]. A 26 [place] Gore dry seal sheath was then advanced over the wire and positioned in the right common iliac vein. The right internal jugular vein was interrogated with ultrasound and found to be widely patent. An image was obtained and stored for the medical record. Local anesthesia was attained by infiltration with 1% lidocaine. A small dermatotomy was made. Under real-time sonographic guidance, the vessel was punctured with a 21 gauge micropuncture needle. Using standard technique, the initial micro needle was exchanged over a 0.018 micro wire for a transitional 4 [place] micro sheath. The micro sheath was then exchanged over a 0.035 wire for a 6 French vascular sheath. A superstiff Amplatz wire was advanced into the suprarenal IVC. The skin tract was then serially dilated and an 29 French reperfusion catheter was advanced and position with the tip at the cavoatrial junction. At this time, the patient was fully heparinized until the ACT level was greater than 300 and the AngioVac circuit was established. The angio [facility] device was advanced through the 67 [place] sheath from the right common femoral approach and suction thrombectomy was performed throughout the infrarenal inferior vena cava. Working from the popliteal access, additional left lower extremity venography images were performed focusing on the iliac and common femoral vasculature. Thrombus is present within the common femoral vein and the external iliac vein. The left common iliac vein is completely  occluded similar to the other side. Attempts were made to pass through the occluded left common iliac vein from below. This was unsuccessful. Therefore, a second right internal jugular venous access was required. The right internal jugular vein was was again interrogated with ultrasound and found to be widely patent. An image was obtained and stored for the medical record. Local anesthesia was attained by infiltration with 1% lidocaine. A small dermatotomy was made. Under real-time sonographic guidance, the vessel was punctured with a 21 gauge micropuncture needle. Using standard technique, the initial micro needle was exchanged over a 0.018 micro wire for a transitional 4 [place] micro sheath. The micro sheath was then exchanged over a 0.035 wire for a 6 French vascular sheath. An angled catheter and glidewire were advanced  through the sheath, through the IVC filter, through the infrarenal IVC and the catheter successfully navigated across the chronically occluded left common iliac vein. The glidewire was then exchanged for an exchange length Glidewire and snared from the femoral vein in the midthigh and brought through the popliteal sheath access resulting in successful through and through access from the right internal jugular vein through the left popliteal vein. A catheter was then advanced over the wire into the femoral vein and the glidewire exchanged for a superstiff Amplatz wire. At this time, another cavagram was performed demonstrating successful debulking of the caval thrombus. At this point it was decided to proceed with removal of the IVC filter. Over the superstiff Amplatz wire from the right jugular vein approach, the skin tract was dilated and a second 26 [place] Gore dry seal sheath was advanced and positioned in the suprarenal IVC. A Bard IVC filter retrieval set was then advanced coaxially through this device next to the Amplatz wire. The Bard [place] IVC filter was successfully retrieved using  standard technique. Additional thrombectomy was then performed using the angio VAC device. This yielded significant material. Repeat vena cavagram demonstrates complete removal of thrombus from the inferior vena cava. Attention was next turned to the occluded left common iliac artery. The perfusion circuit was reversed and the reperfusion catheter was placed in the right femoral sheath and the angio back device was advanced through the right IJ sheath. Suction thrombectomy was performed in the right common iliac vein. Working from the popliteal access, angioplasty was performed of the common femoral, external iliac and common iliac veins and thrombus was pushed into the AngioVac. There appears to be a significant stenosis at the transition from the common iliac to the external iliac vein. This was resistant to initial angioplasty, so a 14 x 60 mm wall stent was selected, placed across the lesion and deployed. The wall stent was then post dilated to 12 mm using the 12 by 40 Conquest balloon. Catheter venography demonstrates significant near occlusive thrombus throughout the common femoral vein, and femoral vein in the thigh. Therefore, the decision was made to proceed with additional thrombectomy from the popliteal access using the [facility]. The 6 French left popliteal sheath was exchanged over the wire for a 9 French sheath. The is a long take catheter was advanced over the wire and real lytic thrombectomy performed throughout the femoral, common femoral and iliac veins. Follow-up angiography demonstrates significant improvement with restoration of in-line flow. There is a significant residual stenosis at the common femoral vein. This was angioplastied to 12 mm using the 12 x 40 mm Conquest balloon. On the final angiographic runs, there is successful restoration of in-line flow from the popliteal access through the IVC as well as successful recanalization of both iliac veins. All wires and  devices were removed. Once the patient's ACT drop below 200, the sheaths were removed. Hemostasis was attained with a combination of manual pressure and pursestring sutures at both 26 [place] sheath sites. 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 Significant stenosis at the transition from the left common to the left external iliac vein 3. 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 Successful recanalization and angioplasty of the chronically occluded right common iliac vein 2. Successful thrombolysis/thrombectomy of the inferior vena cava, left-sided iliac veins and left lower extremity deep venous system to the level of the popliteal vein. 3. Stent assisted angioplasty of a flow limiting stenosis at  the interface of the left common and external iliac veins. 4. Angioplasty of a stenotic segment of the left common femoral vein. 5. The IVC filter was retrieved.  No additional filter was placed. PLAN: 1. Continue full-dose heparin and transitioned to weight based Lovenox prior to discharge. 2. Progress toward ambulation as soon as clinically possible. 3. Compression garment or Ace wraps to the left lower extremity if needed to facilitate edema reduction. 4. Follow-up with Dr. Laurence Ferrari in interventional radiology clinic in 2 weeks. 5. If patient demonstrates any evidence of bleeding and needs to come off of his anticoagulation in the future, he must have an IVC filter replaced. Signed, Criselda Peaches, MD [facility] Radiology Electronically Signed   By: Jacqulynn Cadet M.D.   On: 01/07/2018 10:46   Ir [facility] [employer] Stent  Initial Vein  Inc Angioplasty  Result Date: 01/07/2018 INDICATION: Jeff Jeff [name] with metastatic colon cancer an acute severe iliocaval and left lower extremity DVT. He presents for thrombectomy. EXAM: 1. Ultrasound-guided vascular access left popliteal vein 2. Ultrasound-guided access right  common femoral vein 3. Ultrasound-guided access right internal jugular vein 4. Second ultrasound-guided access right internal jugular vein 5. Left lower extremity venogram 6. Right lower extremity venogram 7. Inferior vena cavogram 8. Combined pharmacomechanical thrombolysis/thrombectomy using tPA, the AngioVac device, and the Angiojet device 9. IVC filter retrieval 10. Recanalization and balloon angioplasty of the right common iliac vein 11. Recanalization and angioplasty with subsequent stent placement in the left iliac vein 12. Balloon angioplasty of the left common femoral and femoral veins Operating Physicians: Corrie Mckusick, DO and Jacqulynn Cadet, MD COMPARISON:  CT scan 12/30/2017 MEDICATIONS: 12 mg tPA administered directly into thrombus throughout the course of the procedure ANESTHESIA/SEDATION: General anesthesia provided by the anesthesiology service FLUOROSCOPY TIME:  Fluoroscopy Time: 67 minutes 6 seconds (816.6 mGy). COMPLICATIONS: None immediate. TECHNIQUE: Informed written consent was obtained from the patient after a thorough discussion of the procedural risks, benefits and alternatives. All questions were addressed. Maximal Sterile Barrier Technique was utilized including caps, mask, sterile gowns, sterile gloves, sterile drape, hand hygiene and skin antiseptic. A timeout was performed prior to the initiation of the procedure. The patient was initially positioned prone. The left popliteal vein was interrogated with ultrasound and found to be widely patent. An image was obtained and stored for the medical record. Local anesthesia was attained by infiltration with 1% lidocaine. A small dermatotomy was made. Under real-time sonographic guidance, the vessel was punctured with a 21 gauge micropuncture needle. Using standard technique, the initial micro needle was exchanged over a 0.018 micro wire for a transitional 4 [place] micro sheath. The micro sheath was then exchanged over a 0.035 wire for a 45 cm  6 [place] braided Arrow sheath. A left lower extremity venogram was performed. The femoral vein is completely occluded in the thigh. The popliteal vein remains patent. 6 mg tPA was laced throughout the thrombus within the common and femoral veins. The patient was repositioned supine and intubated. The right common femoral was interrogated with ultrasound and found to be patent with changes of chronic DVT. An image was obtained and stored for the medical record. Local anesthesia was attained by infiltration with 1% lidocaine. A small dermatotomy was made. Under real-time sonographic guidance, the vessel was punctured with a 21 gauge micropuncture needle. Using standard technique, the initial micro needle was exchanged over a 0.018 micro wire for a transitional 4 [place] micro sheath. The micro sheath was then  exchanged over a 0.035 wire for a 6 French vascular sheath. Right lower extremity venography was performed. The common femoral and external iliac veins are patent. 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 All venous drainage is via parallel spinous collaterals. An angled catheter and glidewire were introduced through the sheath. With some manipulation, the catheter was successfully advanced into the suprarenal IVC. An inferior vena cavagram was performed confirming patency of both renal veins and the super renal IVC. A superstiff Amplatz wire was advanced into the right heart. A 10 x 40 mm Conquest balloon was introduced over the wire the occluded segment of the common iliac vein was angioplastied. Repeat venography demonstrates recreation of a small vascular channel. A 10 x 40 mm Conquest balloon was then selected and introduced over the wire. Venography demonstrates successful recanalization of the common iliac vein. There is flow through the IVC. 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 The 6 French sheath was exchanged in the soft tissue tract dilated  to 24 [place]. A 26 [place] Gore dry seal sheath was then advanced over the wire and positioned in the right common iliac vein. The right internal jugular vein was interrogated with ultrasound and found to be widely patent. An image was obtained and stored for the medical record. Local anesthesia was attained by infiltration with 1% lidocaine. A small dermatotomy was made. Under real-time sonographic guidance, the vessel was punctured with a 21 gauge micropuncture needle. Using standard technique, the initial micro needle was exchanged over a 0.018 micro wire for a transitional 4 [place] micro sheath. The micro sheath was then exchanged over a 0.035 wire for a 6 French vascular sheath. A superstiff Amplatz wire was advanced into the suprarenal IVC. The skin tract was then serially dilated and an 30 French reperfusion catheter was advanced and position with the tip at the cavoatrial junction. At this time, the patient was fully heparinized until the ACT level was greater than 300 and the AngioVac circuit was established. The angio [facility] device was advanced through the 92 French sheath from the right common femoral approach and suction thrombectomy was performed throughout the infrarenal inferior vena cava. Working from the popliteal access, additional left lower extremity venography images were performed focusing on the iliac and common femoral vasculature. Thrombus is present within the common femoral vein and the external iliac vein. The left common iliac vein is completely occluded similar to the other side. Attempts were made to pass through the occluded left common iliac vein from below. This was unsuccessful. Therefore, a second right internal jugular venous access was required. The right internal jugular vein was was again interrogated with ultrasound and found to be widely patent. An image was obtained and stored for the medical record. Local anesthesia was attained by infiltration with 1% lidocaine. A small  dermatotomy was made. Under real-time sonographic guidance, the vessel was punctured with a 21 gauge micropuncture needle. Using standard technique, the initial micro needle was exchanged over a 0.018 micro wire for a transitional 4 [place] micro sheath. The micro sheath was then exchanged over a 0.035 wire for a 6 French vascular sheath. An angled catheter and glidewire were advanced through the sheath, through the IVC filter, through the infrarenal IVC and the catheter successfully navigated across the chronically occluded left common iliac vein. The glidewire was then exchanged for an exchange length Glidewire and snared from the femoral vein in the midthigh and brought through the popliteal sheath access resulting  in successful through and through access from the right internal jugular vein through the left popliteal vein. A catheter was then advanced over the wire into the femoral vein and the glidewire exchanged for a superstiff Amplatz wire. At this time, another cavagram was performed demonstrating successful debulking of the caval thrombus. At this point it was decided to proceed with removal of the IVC filter. Over the superstiff Amplatz wire from the right jugular vein approach, the skin tract was dilated and a second 26 [place] Gore dry seal sheath was advanced and positioned in the suprarenal IVC. A Bard IVC filter retrieval set was then advanced coaxially through this device next to the Amplatz wire. The Bard [place] IVC filter was successfully retrieved using standard technique. Additional thrombectomy was then performed using the angio VAC device. This yielded significant material. Repeat vena cavagram demonstrates complete removal of thrombus from the inferior vena cava. Attention was next turned to the occluded left common iliac artery. The perfusion circuit was reversed and the reperfusion catheter was placed in the right femoral sheath and the angio back device was advanced through the right IJ  sheath. Suction thrombectomy was performed in the right common iliac vein. Working from the popliteal access, angioplasty was performed of the common femoral, external iliac and common iliac veins and thrombus was pushed into the AngioVac. There appears to be a significant stenosis at the transition from the common iliac to the external iliac vein. This was resistant to initial angioplasty, so a 14 x 60 mm wall stent was selected, placed across the lesion and deployed. The wall stent was then post dilated to 12 mm using the 12 by 40 Conquest balloon. Catheter venography demonstrates significant near occlusive thrombus throughout the common femoral vein, and femoral vein in the thigh. Therefore, the decision was made to proceed with additional thrombectomy from the popliteal access using the [facility]. The 6 French left popliteal sheath was exchanged over the wire for a 9 French sheath. The is a long take catheter was advanced over the wire and real lytic thrombectomy performed throughout the femoral, common femoral and iliac veins. Follow-up angiography demonstrates significant improvement with restoration of in-line flow. There is a significant residual stenosis at the common femoral vein. This was angioplastied to 12 mm using the 12 x 40 mm Conquest balloon. On the final angiographic runs, there is successful restoration of in-line flow from the popliteal access through the IVC as well as successful recanalization of both iliac veins. All wires and devices were removed. Once the patient's ACT drop below 200, the sheaths were removed. Hemostasis was attained with a combination of manual pressure and pursestring sutures at both 26 [place] sheath sites. 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 Significant stenosis at the transition from the left common to the left external iliac vein 3. 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 Successful  recanalization and angioplasty of the chronically occluded right common iliac vein 2. Successful thrombolysis/thrombectomy of the inferior vena cava, left-sided iliac veins and left lower extremity deep venous system to the level of the popliteal vein. 3. Stent assisted angioplasty of a flow limiting stenosis at the interface of the left common and external iliac veins. 4. Angioplasty of a stenotic segment of the left common femoral vein. 5. The IVC filter was retrieved.  No additional filter was placed. PLAN: 1. Continue full-dose heparin and transitioned to weight based Lovenox prior to discharge. 2. Progress toward  ambulation as soon as clinically possible. 3. Compression garment or Ace wraps to the left lower extremity if needed to facilitate edema reduction. 4. Follow-up with Dr. Laurence Ferrari in interventional radiology clinic in 2 weeks. 5. If patient demonstrates any evidence of bleeding and needs to come off of his anticoagulation in the future, he must have an IVC filter replaced. Signed, Criselda Peaches, MD [facility] Radiology Electronically Signed   By: Jacqulynn Cadet M.D.   On: 01/07/2018 10:46   Ir Ivc Filter Retrieval / S&i /img Guid/mod Sed  Result Date: 01/07/2018 INDICATION: Jeff Jeff [name] with metastatic colon cancer an acute severe iliocaval and left lower extremity DVT. He presents for thrombectomy. EXAM: 1. Ultrasound-guided vascular access left popliteal vein 2. Ultrasound-guided access right common femoral vein 3. Ultrasound-guided access right internal jugular vein 4. Second ultrasound-guided access right internal jugular vein 5. Left lower extremity venogram 6. Right lower extremity venogram 7. Inferior vena cavogram 8. Combined pharmacomechanical thrombolysis/thrombectomy using tPA, the AngioVac device, and the Angiojet device 9. IVC filter retrieval 10. Recanalization and balloon angioplasty of the right common iliac vein 11.  Recanalization and angioplasty with subsequent stent placement in the left iliac vein 12. Balloon angioplasty of the left common femoral and femoral veins Operating Physicians: Corrie Mckusick, DO and Jacqulynn Cadet, MD COMPARISON:  CT scan 12/30/2017 MEDICATIONS: 12 mg tPA administered directly into thrombus throughout the course of the procedure ANESTHESIA/SEDATION: General anesthesia provided by the anesthesiology service FLUOROSCOPY TIME:  Fluoroscopy Time: 67 minutes 6 seconds (816.6 mGy). COMPLICATIONS: None immediate. TECHNIQUE: Informed written consent was obtained from the patient after a thorough discussion of the procedural risks, benefits and alternatives. All questions were addressed. Maximal Sterile Barrier Technique was utilized including caps, mask, sterile gowns, sterile gloves, sterile drape, hand hygiene and skin antiseptic. A timeout was performed prior to the initiation of the procedure. The patient was initially positioned prone. The left popliteal vein was interrogated with ultrasound and found to be widely patent. An image was obtained and stored for the medical record. Local anesthesia was attained by infiltration with 1% lidocaine. A small dermatotomy was made. Under real-time sonographic guidance, the vessel was punctured with a 21 gauge micropuncture needle. Using standard technique, the initial micro needle was exchanged over a 0.018 micro wire for a transitional 4 [place] micro sheath. The micro sheath was then exchanged over a 0.035 wire for a 45 cm 6 [place] braided Arrow sheath. A left lower extremity venogram was performed. The femoral vein is completely occluded in the thigh. The popliteal vein remains patent. 6 mg tPA was laced throughout the thrombus within the common and femoral veins. The patient was repositioned supine and intubated. The right common femoral was interrogated with ultrasound and found to be patent with changes of chronic DVT. An image was obtained and stored for  the medical record. Local anesthesia was attained by infiltration with 1% lidocaine. A small dermatotomy was made. Under real-time sonographic guidance, the vessel was punctured with a 21 gauge micropuncture needle. Using standard technique, the initial micro needle was exchanged over a 0.018 micro wire for a transitional 4 [place] micro sheath. The micro sheath was then exchanged over a 0.035 wire for a 6 French vascular sheath. Right lower extremity venography was performed. The common femoral and external iliac veins are patent. 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 All venous drainage is via parallel spinous collaterals. An angled catheter and glidewire were introduced through the sheath. With  some manipulation, the catheter was successfully advanced into the suprarenal IVC. An inferior vena cavagram was performed confirming patency of both renal veins and the super renal IVC. A superstiff Amplatz wire was advanced into the right heart. A 10 x 40 mm Conquest balloon was introduced over the wire the occluded segment of the common iliac vein was angioplastied. Repeat venography demonstrates recreation of a small vascular channel. A 10 x 40 mm Conquest balloon was then selected and introduced over the wire. Venography demonstrates successful recanalization of the common iliac vein. There is flow through the IVC. 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 The 6 French sheath was exchanged in the soft tissue tract dilated to 24 [place]. A 26 [place] Gore dry seal sheath was then advanced over the wire and positioned in the right common iliac vein. The right internal jugular vein was interrogated with ultrasound and found to be widely patent. An image was obtained and stored for the medical record. Local anesthesia was attained by infiltration with 1% lidocaine. A small dermatotomy was made. Under real-time sonographic guidance, the vessel was punctured with a  21 gauge micropuncture needle. Using standard technique, the initial micro needle was exchanged over a 0.018 micro wire for a transitional 4 [place] micro sheath. The micro sheath was then exchanged over a 0.035 wire for a 6 French vascular sheath. A superstiff Amplatz wire was advanced into the suprarenal IVC. The skin tract was then serially dilated and an 109 French reperfusion catheter was advanced and position with the tip at the cavoatrial junction. At this time, the patient was fully heparinized until the ACT level was greater than 300 and the AngioVac circuit was established. The angio [facility] device was advanced through the 57 [place] sheath from the right common femoral approach and suction thrombectomy was performed throughout the infrarenal inferior vena cava. Working from the popliteal access, additional left lower extremity venography images were performed focusing on the iliac and common femoral vasculature. Thrombus is present within the common femoral vein and the external iliac vein. The left common iliac vein is completely occluded similar to the other side. Attempts were made to pass through the occluded left common iliac vein from below. This was unsuccessful. Therefore, a second right internal jugular venous access was required. The right internal jugular vein was was again interrogated with ultrasound and found to be widely patent. An image was obtained and stored for the medical record. Local anesthesia was attained by infiltration with 1% lidocaine. A small dermatotomy was made. Under real-time sonographic guidance, the vessel was punctured with a 21 gauge micropuncture needle. Using standard technique, the initial micro needle was exchanged over a 0.018 micro wire for a transitional 4 [place] micro sheath. The micro sheath was then exchanged over a 0.035 wire for a 6 French vascular sheath. An angled catheter and glidewire were advanced through the sheath, through the IVC filter, through the  infrarenal IVC and the catheter successfully navigated across the chronically occluded left common iliac vein. The glidewire was then exchanged for an exchange length Glidewire and snared from the femoral vein in the midthigh and brought through the popliteal sheath access resulting in successful through and through access from the right internal jugular vein through the left popliteal vein. A catheter was then advanced over the wire into the femoral vein and the glidewire exchanged for a superstiff Amplatz wire. At this time, another cavagram was performed demonstrating successful debulking of the caval thrombus.  At this point it was decided to proceed with removal of the IVC filter. Over the superstiff Amplatz wire from the right jugular vein approach, the skin tract was dilated and a second 26 [place] Gore dry seal sheath was advanced and positioned in the suprarenal IVC. A Bard IVC filter retrieval set was then advanced coaxially through this device next to the Amplatz wire. The Bard [place] IVC filter was successfully retrieved using standard technique. Additional thrombectomy was then performed using the angio VAC device. This yielded significant material. Repeat vena cavagram demonstrates complete removal of thrombus from the inferior vena cava. Attention was next turned to the occluded left common iliac artery. The perfusion circuit was reversed and the reperfusion catheter was placed in the right femoral sheath and the angio back device was advanced through the right IJ sheath. Suction thrombectomy was performed in the right common iliac vein. Working from the popliteal access, angioplasty was performed of the common femoral, external iliac and common iliac veins and thrombus was pushed into the AngioVac. There appears to be a significant stenosis at the transition from the common iliac to the external iliac vein. This was resistant to initial angioplasty, so a 14 x 60 mm wall stent was selected, placed  across the lesion and deployed. The wall stent was then post dilated to 12 mm using the 12 by 40 Conquest balloon. Catheter venography demonstrates significant near occlusive thrombus throughout the common femoral vein, and femoral vein in the thigh. Therefore, the decision was made to proceed with additional thrombectomy from the popliteal access using the [facility]. The 6 French left popliteal sheath was exchanged over the wire for a 9 French sheath. The is a long take catheter was advanced over the wire and real lytic thrombectomy performed throughout the femoral, common femoral and iliac veins. Follow-up angiography demonstrates significant improvement with restoration of in-line flow. There is a significant residual stenosis at the common femoral vein. This was angioplastied to 12 mm using the 12 x 40 mm Conquest balloon. On the final angiographic runs, there is successful restoration of in-line flow from the popliteal access through the IVC as well as successful recanalization of both iliac veins. All wires and devices were removed. Once the patient's ACT drop below 200, the sheaths were removed. Hemostasis was attained with a combination of manual pressure and pursestring sutures at both 26 [place] sheath sites. 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 Significant stenosis at the transition from the left common to the left external iliac vein 3. 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 Successful recanalization and angioplasty of the chronically occluded right common iliac vein 2. Successful thrombolysis/thrombectomy of the inferior vena cava, left-sided iliac veins and left lower extremity deep venous system to the level of the popliteal vein. 3. Stent assisted angioplasty of a flow limiting stenosis at the interface of the left common and external iliac veins. 4. Angioplasty of a stenotic segment of the left common femoral vein. 5. The IVC  filter was retrieved.  No additional filter was placed. PLAN: 1. Continue full-dose heparin and transitioned to weight based Lovenox prior to discharge. 2. Progress toward ambulation as soon as clinically possible. 3. Compression garment or Ace wraps to the left lower extremity if needed to facilitate edema reduction. 4. Follow-up with Dr. Laurence Ferrari in interventional radiology clinic in 2 weeks. 5. If patient demonstrates any evidence of bleeding and needs to come off of his anticoagulation in  the future, he must have an IVC filter replaced. Signed, Criselda Peaches, MD [facility] Radiology Electronically Signed   By: Jacqulynn Cadet M.D.   On: 01/07/2018 10:46    Labs:  CBC: Recent Labs    01/08/18 0249 01/08/18 1224 01/08/18 1457 01/09/18 0624 01/10/18 0653  WBC 6.0 6.3  --  5.4 5.3  HGB 5.9* 8.4* 8.1* 8.8* 8.7*  HCT 18.5* 25.8* 25.1* 27.5* 27.5*  PLT 102* 96*  --  101* 117*    COAGS: Recent Labs    01/29/17 1256 07/Jeff/18 0340  08/12/17 0527 08/19/17 1047  01/02/18 1815 01/03/18 0158 01/03/18 0829 01/04/18 0615  INR 1.07 1.27  --  1.02 1.77  --   --   --   --   --   APTT 31  --    < >  --   --    < > 44* Jeff* 82* 91*   < > = values in this interval not displayed.    BMP: Recent Labs    01/02/18 0537 01/06/18 1503 01/06/18 2311 01/07/18 0351 01/08/18 0249  NA 132* 138 134* 136 138  K 3.5 3.8 4.4 4.5 3.7  CL 99*  --  102 104 106  CO2 26  --  23 24 27   GLUCOSE 101* 111* 151* 136* 114*  BUN 8  --  9 11 11   CALCIUM 8.1*  --  7.5* 7.7* 7.3*  CREATININE 0.69  --  0.73 0.80 0.75  GFRNONAA >60  --  >60 >60 >60  GFRAA >60  --  >60 >60 >60    LIVER FUNCTION TESTS: Recent Labs    11/29/17 0950 12/13/17 0835 12/27/17 0820 12/30/17 1907  BILITOT 0.34 0.31 0.52 0.7  AST 21 29 16 22   ALT 9 10 6  8*  ALKPHOS 94 95 69 66  PROT 7.9 8.2 7.8 8.4*  ALBUMIN 3.6 3.7 3.0* 3.2*    Assessment and Plan:  1/10 IR  procedure: Combined pharmacomechanical thrombolysis/thrombectomy using tPA, the AngioVac device, and the Angiojet device IVC filter retrieval  Recanalization and balloon angioplasty of the right common iliac Vein  Recanalization and angioplasty with subsequent stent placement in the left iliac vein Balloon angioplasty of the left common femoral and femoral veins  Plan for follow up 2 weeks with Dr Laurence Ferrari-- orders in place Continue anticoagulation  Electronically Signed: Tenita Cue A, PA-C 01/10/2018, 10:33 AM   I spent a total of 25 Minutes at the the patient's bedside AND on the patient's hospital floor or unit, greater than 50% of which was counseling/coordinating care for iliac veins; IVC thrombolysis intervention

## 2018-01-10 NOTE — Plan of Care (Signed)
Pt has a colostomy and has been draining mushy soft formed brown stool small to medium amount with stoma pink. Pt has a foley catheter, explained to patient the importance of removing the catheter and trying to see if he will be able to void on his own, will D/C as per protocol and continue to monitor. Pt was instructed on importance of using the IS and was able to return demonstrate that he understood how to use it. Pt was assisted to side of bed to dangle, will get OOB after foley catheter pulled and will continue to monitor.

## 2018-01-11 ENCOUNTER — Other Ambulatory Visit: Payer: Self-pay | Admitting: Hematology

## 2018-01-11 DIAGNOSIS — R5381 Other malaise: Secondary | ICD-10-CM

## 2018-01-11 DIAGNOSIS — R5081 Fever presenting with conditions classified elsewhere: Secondary | ICD-10-CM

## 2018-01-11 DIAGNOSIS — C189 Malignant neoplasm of colon, unspecified: Secondary | ICD-10-CM

## 2018-01-11 LAB — CULTURE, BLOOD (ROUTINE X 2)
Special Requests: ADEQUATE
Special Requests: ADEQUATE

## 2018-01-11 LAB — CBC
HCT: 28 % — ABNORMAL LOW (ref 39.0–52.0)
Hemoglobin: 8.7 g/dL — ABNORMAL LOW (ref 13.0–17.0)
MCH: 27 pg (ref 26.0–34.0)
MCHC: 31.1 g/dL (ref 30.0–36.0)
MCV: 87 fL (ref 78.0–100.0)
PLATELETS: 135 10*3/uL — AB (ref 150–400)
RBC: 3.22 MIL/uL — ABNORMAL LOW (ref 4.22–5.81)
RDW: 16.7 % — AB (ref 11.5–15.5)
WBC: 5.3 10*3/uL (ref 4.0–10.5)

## 2018-01-11 LAB — COMPREHENSIVE METABOLIC PANEL
ALBUMIN: 2.2 g/dL — AB (ref 3.5–5.0)
ALT: 9 U/L — ABNORMAL LOW (ref 17–63)
AST: 24 U/L (ref 15–41)
Alkaline Phosphatase: 56 U/L (ref 38–126)
Anion gap: 8 (ref 5–15)
BUN: 8 mg/dL (ref 6–20)
CHLORIDE: 105 mmol/L (ref 101–111)
CO2: 25 mmol/L (ref 22–32)
Calcium: 8.6 mg/dL — ABNORMAL LOW (ref 8.9–10.3)
Creatinine, Ser: 0.63 mg/dL (ref 0.61–1.24)
GFR calc Af Amer: >60 mL/min (ref >60)
GFR calc non Af Amer: >60 mL/min (ref >60)
GLUCOSE: 99 mg/dL (ref 65–99)
POTASSIUM: 4.4 mmol/L (ref 3.5–5.1)
Sodium: 138 mmol/L (ref 135–145)
Total Bilirubin: 0.5 mg/dL (ref 0.3–1.2)
Total Protein: 6.2 g/dL — ABNORMAL LOW (ref 6.5–8.1)

## 2018-01-11 LAB — PHOSPHORUS: PHOSPHORUS: 3.8 mg/dL (ref 2.5–4.6)

## 2018-01-11 LAB — MAGNESIUM: Magnesium: 1.3 mg/dL — ABNORMAL LOW (ref 1.7–2.4)

## 2018-01-11 MED ORDER — MAGNESIUM SULFATE 2 GM/50ML IV SOLN
2.0000 g | Freq: Once | INTRAVENOUS | Status: AC
  Administered 2018-01-11: 2 g via INTRAVENOUS
  Filled 2018-01-11: qty 50

## 2018-01-11 MED ORDER — RIVAROXABAN (XARELTO) VTE STARTER PACK (15 & 20 MG): ORAL_TABLET | ORAL | 0 refills | Status: DC

## 2018-01-11 MED ORDER — OXYCODONE HCL 10 MG PO TABS: 10.0000 mg | ORAL_TABLET | Freq: Four times a day (QID) | ORAL | 0 refills | Status: DC | PRN

## 2018-01-11 MED ORDER — ENSURE ENLIVE PO LIQD: 1.0000 | Freq: Three times a day (TID) | ORAL | 0 refills | Status: DC

## 2018-01-11 MED FILL — CLINDAMYCIN PHOS-BENZOYL PE: 1.2-2.5 | 30 days supply | Qty: 50 | Fill #0

## 2018-01-11 MED FILL — oxyCODONE HCL 10 MG TABS: 10 | 22 days supply | Qty: 90 | Fill #0

## 2018-01-11 MED FILL — XARELTO STARTER PACK: 15 & 20 | 30 days supply | Qty: 51 | Fill #0

## 2018-01-11 NOTE — Discharge Summary (Signed)
Discharge Summary  Maxtyn Nuzum Gunnarson ZOX:[id number] [name]: [birth year]  PCP: [name], MD  Admit date: 12/30/2017 Discharge date: 01/11/2018  Time spent: 25 minutes  Recommendations for Outpatient Follow-up:  1. Follow up with oncology post discharge 2. Follow up with your PCP within a week 3. Take your medications as prescribed 4. Fall precaution 5. Continue PT at home  Discharge Diagnoses:  Active Hospital Problems   Diagnosis Date Noted  . Evaluation by psychiatric service required 01/04/2018  . DVT, bilateral lower limbs ([place]) 01/06/2018  . Malignant neoplasm of colon ([place])   . Palliative care by specialist   . Advance care planning   . DVT (deep venous thrombosis) ([place]) 12/31/2017    Resolved Hospital Problems  No resolved problems to display.    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 Iimaging studies notable for worsening left LE DVT. Initially hesitant to start anticoagulation due to known history of GI bleed while on Xarelto. Started on Eliquis and transferred to [facility] for thrombectomy. Post op #5. Transferred to ICU for vent management. Extubated 01/07/18 and breathing well on ambient air.  After thrombectomy was continued on heparin drip. No reported overt bleeding. 01/10/18 started xarelto. Patient refused lovenox injection. Pharmacy consulted to manage anticoagulation. Severe hypomagnesemia despite repletion. Improving after several infusions of IV magnesium.   On the day of discharge the patient  was hemodynamically stable. He will need to follow up with his oncologist and his PCP post hospitalization. He will also need to continue physical therapy at home to improve physical debilitation.   Hospital Course:  Principal Problem:   Evaluation by psychiatric service required Active Problems:   DVT (deep venous thrombosis) (HCC)   Malignant neoplasm of colon ([place])   Palliative care by specialist   Advance care planning   DVT, bilateral lower limbs ([place])  Left lowerextremity DVT, acute post thrombectomy POD#5 by IR -started xarelto 01/10/18- no drop in hg/bleeding -education for discharge planning -Transferred from [facility] long hospital to [facility] on 01/05/18 -CT scan showed extensive thrombosis in the IVC, common iliac, left iliac vein and femoral vein -prior history of GI bleeding from his left colon mass when he was onmaintenance Xarelto,he is at high risk for recurrent bleeding from anticoagulation,  -MRI brain without metastatic disease -Pt agrees to continueanticoagulation after procedure -01/10/18: patient declines lovenox injection, agrees to po anticoagulation with xarelto  Severe hypomagnesemia -mg2+ 1.3 from 1.0 from 0.8 -2gm IV mag2+(01/10/18) and 2gm IV mg2+ (01/11/18) -Follow up with PCP to monitor electrolytes  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 -on palliative chemotherapy 5-FU and Panitumumab -last chemo session 11/27/2017, follows with Dr Burr Medico  Acute on chronic chronic anemia of chronic disease -anemia in the setting of underlying colon cancer & secondary to chemotherapy -high risk for bleeding and further blood loss -on anticoagulation watch closely and transfuse as clinically indicated -No evidence of bleeding -Follow up with PCP/oncology to monitor hg/anemia  Moderate Protein and calorie malnutrition with Anorexia -c/w oral supplements -  c/w Remeron for appetite stimulation  Debilitating -PT recommends HHPT -continue  physical therapy at home  Consultants:  IR   CCM for vent management while pt was intubated   Pharmacy for lovenox management  Procedures:  Intubation 01/06/18  thombectomy 01/06/18  Discharge Exam: BP 94/70 (BP Location: Right Arm)   Pulse (!) 102   Temp 98.1 F (36.7 C) (Oral)   Resp 18   Ht 5\' 9"  (1.753 m)   Wt 59.4 kg (131 lb)   SpO2 100%   BMI 19.35 kg/m   General: 57 yo AAM WD WN NAD A&O x 3  Cardiovascular: RRR no rubs or gallops  Respiratory: CTA no wheezes or rales  Discharge Instructions You were cared for by a hospitalist during your hospital stay. If you have any questions about your discharge medications or the care you received while you were in the hospital after you are discharged, you can call the unit and asked to speak with the hospitalist on call if the hospitalist that took care of you is not available. Once you are discharged, your primary care physician will handle any further medical issues. Please note that NO REFILLS for any discharge medications will be authorized once you are discharged, as it is imperative that you return to your primary care physician (or establish a relationship with a primary care physician if you do not have one) for your aftercare needs so that they can reassess your need for medications and monitor your lab values.  Discharge Instructions    Face-to-face encounter (required for Medicare/Medicaid patients)   Complete by:  As directed    I Blanchie Dessert certify that this patient is under my care and that I, or a nurse practitioner or physician's assistant working with me, had a face-to-face encounter that meets the physician face-to-face encounter requirements with this patient on 12/31/2017. The encounter with the patient was in whole, or in part for the following medical condition(s) which is the primary reason for home health care (List medical condition): Patient with a history of metastatic cancer with prior GI bleeding who  has an IVC filter but worsening DVT with worsening left leg pain and swelling it making it almost impossible to bear weight and walk due to severe pain.  All other questions or further needs can contact [name] the pt's oncologist   The encounter with the patient was in whole, or in part, for the following medical condition, which is the primary reason for home health care:  severe left leg pain and swelling related to DVT from metastatic cancer making difficult to walk   I certify that, based on my findings, the following services are medically necessary home health services:   Nursing Physical therapy     Reason for Medically Necessary Home Health Services:  Therapy- Personnel officer, Public librarian   My clinical findings support the need for the above services:  Pain interferes with ambulation/mobility   Further, I certify that my clinical findings support that this patient is homebound due to:  Pain interferes with ambulation/mobility   Home Health   Complete by:  As directed    To provide the following care/treatments:   PT [place]       Allergies as of 01/11/2018   No Known Allergies     Medication List    STOP taking these medications   Clindamycin Phos-Benzoyl Perox gel   diphenoxylate-atropine 2.5-0.025 MG tablet Commonly known as:  LOMOTIL  loperamide 2 MG tablet Commonly known as:  IMODIUM A-D     TAKE these medications   docusate sodium 100 MG capsule Commonly known as:  COLACE Take 100 mg by mouth 2 (two) times daily.   ferrous sulfate 325 (65 FE) MG tablet Take 1 tablet (325 mg total) by mouth 2 (two) times daily with a meal.   gabapentin 100 MG capsule Commonly known as:  NEURONTIN Take 1 capsule (100 mg total) 3 (three) times daily by mouth.   hydrocortisone 2.5 % cream Apply topically 2 (two) times daily.   magnesium oxide 400 (241.3 Mg) MG tablet Commonly known as:  MAG-OX Take 1 tablet (400 mg total) by mouth  daily. What changed:  Another medication with the same name was removed. Continue taking this medication, and follow the directions you see here.   mirtazapine 15 MG tablet Commonly known as:  REMERON Take 1 tablet (15 mg total) by mouth at bedtime.   NUTRITIONAL SUPPLEMENT PO House Supplement - give 120 ml by mouth three times daily between meals for supplement What changed:  Another medication with the same name was added. Make sure you understand how and when to take each.   feeding supplement (ENSURE ENLIVE) Liqd Take 237 mLs by mouth 3 (three) times daily. What changed:  You were already taking a medication with the same name, and this prescription was added. Make sure you understand how and when to take each.   ondansetron 8 MG tablet Commonly known as:  ZOFRAN Take 1 tablet (8 mg total) by mouth 2 (two) times daily as needed for refractory nausea / vomiting. Start on day 3 after chemotherapy.   Oxycodone HCl 10 MG Tabs Take 1 tablet (10 mg total) by mouth every 6 (six) hours as needed.   potassium chloride SA 20 MEQ tablet Commonly known as:  K-DUR,KLOR-CON Take 1 tablet (20 mEq total) by mouth 2 (two) times daily.   prochlorperazine 10 MG tablet Commonly known as:  COMPAZINE Take 1 tablet (10 mg total) by mouth every 6 (six) hours as needed (Nausea or vomiting).   Rivaroxaban 15 & 20 MG Tbpk Take as directed on package: Start with one 15mg  tablet by mouth twice a day with food. On Day 22, switch to one 20mg  tablet once a day with food.      No Known Allergies Follow-up Information    Schedule an appointment as soon as possible for a visit with [name], MD.   Specialties:  Hematology, Oncology Contact information: [place] [address] [phone]        Jacqulynn Cadet, MD Follow up in 2 week(s).   Specialties:  Interventional Radiology, Radiology Why:  follow up with Dr Laurence Ferrari; call [phone] if any needs; pt will hear from  scheduler for time and date Contact information: [address]  [place] [address] [phone]        [facility] Follow up.   Specialty:  Home Health Services Why:  Registered Nurse, Physical Therapy, Social Worker. Contact information: [place] [address] [phone]        [name], MD Follow up.   Specialty:  Family Medicine Contact information: [place] [address] [phone]            The results of significant diagnostics from this hospitalization (including imaging, microbiology, ancillary and laboratory) are listed below for reference.    Significant Diagnostic Studies: Ct Abdomen  Pelvis Wo Contrast  Result Date: 01/08/2018 CLINICAL DATA:  Hematuria. Rule out intra-abdominal bleed. Metastatic colon cancer. DVT, recent thrombectomy. EXAM: CT ABDOMEN AND PELVIS WITHOUT CONTRAST TECHNIQUE: Multidetector CT imaging of the abdomen and pelvis was performed following the standard protocol without IV contrast. COMPARISON:  Procedure note of 01/06/2018. Contrast-enhanced CT of 12/30/2017. FINDINGS: Lower chest: Bibasilar atelectasis. Normal heart size with trace bilateral pleural effusion. Hepatobiliary: Grossly normal liver, gallbladder, biliary tract. Pancreas: Normal noncontrast appearance of the pancreas. Spleen: Soft tissue fullness within the splenic hilum, corresponding to the mass detailed on the prior contrast-enhanced exam. Grossly similar, including at 5.2 x 4.4 cm today. Adrenals/Urinary Tract: Normal adrenal glands. No renal calculi or hydronephrosis. Foley catheter within the urinary bladder. Stomach/Bowel: Normal stomach, without wall thickening. Double-barrel transverse colostomy. Normal caliber small bowel loops. Vascular/Lymphatic: Aortic and branch vessel atherosclerosis. Limited evaluation for abdominal adenopathy. No pelvic sidewall adenopathy. Left common and external  iliac venous stent. Reproductive: Normal prostate. Other: No free intraperitoneal air. Diffuse anasarca. Mesenteric edema, without significant fluid. No evidence of intraperitoneal hemorrhage. Musculoskeletal: Right proximal femoral fixation. Remote right rib trauma. IMPRESSION: 1. Limited exam, without oral or IV contrast. 2. No evidence of abdominopelvic hemorrhage. 3. Small bilateral pleural effusions. 4. Grossly similar mass within the splenic hilum. 5.  Aortic Atherosclerosis (ICD10-I70.0). Electronically Signed   By: Abigail Miyamoto M.D.   On: 01/08/2018 13:12   Mr Jeri Cos AS Contrast  Result Date: 01/05/2018 CLINICAL DATA:  Metastatic colon cancer staging EXAM: MRI HEAD WITHOUT AND WITH CONTRAST TECHNIQUE: Multiplanar, multiecho pulse sequences of the brain and surrounding structures were obtained without and with intravenous contrast. CONTRAST:  9mL MULTIHANCE GADOBENATE DIMEGLUMINE 529 MG/ML IV SOLN COMPARISON:  CT head 11/30/2014 FINDINGS: Brain: Ventricle size and cerebral volume normal. Negative for infarct, hemorrhage, or mass. No edema. Normal enhancement following contrast infusion. Vascular: Normal arterial flow voids Skull and upper cervical spine: Negative Sinuses/Orbits: Negative Other: None IMPRESSION: Negative MRI head with contrast.  Negative for metastatic disease. Electronically Signed   By: Franchot Gallo M.D.   On: 01/05/2018 11:40   Ct Abdomen Pelvis W Contrast  Result Date: 12/30/2017 CLINICAL DATA:  57 year old male with abdominal, left pelvic and left leg pain and swelling for several days. History of stage IV colon cancer and currently on chemotherapy. EXAM: CT ABDOMEN AND PELVIS WITH CONTRAST TECHNIQUE: Multidetector CT imaging of the abdomen and pelvis was performed using the standard protocol following bolus administration of intravenous contrast. CONTRAST:  167mL ISOVUE-300 IOPAMIDOL (ISOVUE-300) INJECTION 61% COMPARISON:  11/25/2017 CT and prior studies FINDINGS: Lower chest:  No acute abnormality. Hepatobiliary: No significant hepatic or gallbladder abnormalities. No biliary dilatation. Pancreas: Unremarkable Spleen: A 4.2 x 5.9 cm complex cystic mass in the splenic hilum is unchanged. No new abnormalities identified. Adrenals/Urinary Tract: The kidneys, adrenal glands and bladder are unremarkable except for mild circumferential bladder wall thickening. 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 Wall thickening of the colon at the splenic flexure is unchanged. No bowel obstruction, other definite bowel wall thickening or inflammatory changes. Vascular/Lymphatic: An infrarenal IVC filter is noted with thrombus in the IVC, common iliac veins, left iliac and femoral veins. There is enlargement and inflammation along the left iliac and common femoral vein since the prior study. Aortic atherosclerotic calcifications noted without aneurysm. No definite enlarged lymph nodes. Reproductive: No definite prostate abnormality Other: Stranding in the subcutaneous and mesenteric fat noted. A trace amount of free fluid within the pelvis is noted. No pneumoperitoneum or  definite abscess. Musculoskeletal: No acute abnormality or suspicious bony lesion. IMPRESSION: 1. 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 However, there is now increasing thrombus within the left iliac and femoral veins with adjacent inflammation. 2. Mild circumferential bladder wall thickening which may be reactive or could indicate cystitis. Correlate clinically. 3. Unchanged complex cystic mass in the splenic hilum and focal wall thickening of the splenic flexure. 4.  Aortic Atherosclerosis (ICD10-I70.0). Electronically Signed   By: Margarette Canada M.D.   On: 12/30/2017 20:36   Ir Veno/ext/bi  Result Date: 01/07/2018 INDICATION: 57 year-old male with metastatic colon cancer an acute severe iliocaval and left lower extremity DVT. He presents for thrombectomy. EXAM: 1. Ultrasound-guided vascular access left  popliteal vein 2. Ultrasound-guided access right common femoral vein 3. Ultrasound-guided access right internal jugular vein 4. Second ultrasound-guided access right internal jugular vein 5. Left lower extremity venogram 6. Right lower extremity venogram 7. Inferior vena cavogram 8. Combined pharmacomechanical thrombolysis/thrombectomy using tPA, the AngioVac device, and the Angiojet device 9. IVC filter retrieval 10. Recanalization and balloon angioplasty of the right common iliac vein 11. Recanalization and angioplasty with subsequent stent placement in the left iliac vein 12. Balloon angioplasty of the left common femoral and femoral veins Operating Physicians: Corrie Mckusick, DO and Jacqulynn Cadet, MD COMPARISON:  CT scan 12/30/2017 MEDICATIONS: 12 mg tPA administered directly into thrombus throughout the course of the procedure ANESTHESIA/SEDATION: General anesthesia provided by the anesthesiology service FLUOROSCOPY TIME:  Fluoroscopy Time: 67 minutes 6 seconds (816.6 mGy). COMPLICATIONS: None immediate. TECHNIQUE: Informed written consent was obtained from the patient after a thorough discussion of the procedural risks, benefits and alternatives. All questions were addressed. Maximal Sterile Barrier Technique was utilized including caps, mask, sterile gowns, sterile gloves, sterile drape, hand hygiene and skin antiseptic. A timeout was performed prior to the initiation of the procedure. The patient was initially positioned prone. The left popliteal vein was interrogated with ultrasound and found to be widely patent. An image was obtained and stored for the medical record. Local anesthesia was attained by infiltration with 1% lidocaine. A small dermatotomy was made. Under real-time sonographic guidance, the vessel was punctured with a 21 gauge micropuncture needle. Using standard technique, the initial micro needle was exchanged over a 0.018 micro wire for a transitional 4 [place] micro sheath. The micro sheath  was then exchanged over a 0.035 wire for a 45 cm 6 [place] braided Arrow sheath. A left lower extremity venogram was performed. The femoral vein is completely occluded in the thigh. The popliteal vein remains patent. 6 mg tPA was laced throughout the thrombus within the common and femoral veins. The patient was repositioned supine and intubated. The right common femoral was interrogated with ultrasound and found to be patent with changes of chronic DVT. An image was obtained and stored for the medical record. Local anesthesia was attained by infiltration with 1% lidocaine. A small dermatotomy was made. Under real-time sonographic guidance, the vessel was punctured with a 21 gauge micropuncture needle. Using standard technique, the initial micro needle was exchanged over a 0.018 micro wire for a transitional 4 [place] micro sheath. The micro sheath was then exchanged over a 0.035 wire for a 6 French vascular sheath. Right lower extremity venography was performed. The common femoral and external iliac veins are patent. 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 All venous drainage is via parallel spinous collaterals. An angled catheter and glidewire were introduced through the sheath. With some manipulation, the catheter was  successfully advanced into the suprarenal IVC. An inferior vena cavagram was performed confirming patency of both renal veins and the super renal IVC. A superstiff Amplatz wire was advanced into the right heart. A 10 x 40 mm Conquest balloon was introduced over the wire the occluded segment of the common iliac vein was angioplastied. Repeat venography demonstrates recreation of a small vascular channel. A 10 x 40 mm Conquest balloon was then selected and introduced over the wire. Venography demonstrates successful recanalization of the common iliac vein. There is flow through the IVC. 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 The 6 French  sheath was exchanged in the soft tissue tract dilated to 24 [place]. A 26 French Gore dry seal sheath was then advanced over the wire and positioned in the right common iliac vein. The right internal jugular vein was interrogated with ultrasound and found to be widely patent. An image was obtained and stored for the medical record. Local anesthesia was attained by infiltration with 1% lidocaine. A small dermatotomy was made. Under real-time sonographic guidance, the vessel was punctured with a 21 gauge micropuncture needle. Using standard technique, the initial micro needle was exchanged over a 0.018 micro wire for a transitional 4 [place] micro sheath. The micro sheath was then exchanged over a 0.035 wire for a 6 French vascular sheath. A superstiff Amplatz wire was advanced into the suprarenal IVC. The skin tract was then serially dilated and an 68 French reperfusion catheter was advanced and position with the tip at the cavoatrial junction. At this time, the patient was fully heparinized until the ACT level was greater than 300 and the AngioVac circuit was established. The angio [facility] device was advanced through the 1 French sheath from the right common femoral approach and suction thrombectomy was performed throughout the infrarenal inferior vena cava. Working from the popliteal access, additional left lower extremity venography images were performed focusing on the iliac and common femoral vasculature. Thrombus is present within the common femoral vein and the external iliac vein. The left common iliac vein is completely occluded similar to the other side. Attempts were made to pass through the occluded left common iliac vein from below. This was unsuccessful. Therefore, a second right internal jugular venous access was required. The right internal jugular vein was was again interrogated with ultrasound and found to be widely patent. An image was obtained and stored for the medical record. Local anesthesia was  attained by infiltration with 1% lidocaine. A small dermatotomy was made. Under real-time sonographic guidance, the vessel was punctured with a 21 gauge micropuncture needle. Using standard technique, the initial micro needle was exchanged over a 0.018 micro wire for a transitional 4 [place] micro sheath. The micro sheath was then exchanged over a 0.035 wire for a 6 French vascular sheath. An angled catheter and glidewire were advanced through the sheath, through the IVC filter, through the infrarenal IVC and the catheter successfully navigated across the chronically occluded left common iliac vein. The glidewire was then exchanged for an exchange length Glidewire and snared from the femoral vein in the midthigh and brought through the popliteal sheath access resulting in successful through and through access from the right internal jugular vein through the left popliteal vein. A catheter was then advanced over the wire into the femoral vein and the glidewire exchanged for a superstiff Amplatz wire. At this time, another cavagram was performed demonstrating successful debulking of the caval thrombus. At this point it was  decided to proceed with removal of the IVC filter. Over the superstiff Amplatz wire from the right jugular vein approach, the skin tract was dilated and a second 26 [place] Gore dry seal sheath was advanced and positioned in the suprarenal IVC. A Bard IVC filter retrieval set was then advanced coaxially through this device next to the Amplatz wire. The Bard Armstrong IVC filter was successfully retrieved using standard technique. Additional thrombectomy was then performed using the angio VAC device. This yielded significant material. Repeat vena cavagram demonstrates complete removal of thrombus from the inferior vena cava. Attention was next turned to the occluded left common iliac artery. The perfusion circuit was reversed and the reperfusion catheter was placed in the right femoral sheath and the  angio back device was advanced through the right IJ sheath. Suction thrombectomy was performed in the right common iliac vein. Working from the popliteal access, angioplasty was performed of the common femoral, external iliac and common iliac veins and thrombus was pushed into the AngioVac. There appears to be a significant stenosis at the transition from the common iliac to the external iliac vein. This was resistant to initial angioplasty, so a 14 x 60 mm wall stent was selected, placed across the lesion and deployed. The wall stent was then post dilated to 12 mm using the 12 by 40 Conquest balloon. Catheter venography demonstrates significant near occlusive thrombus throughout the common femoral vein, and femoral vein in the thigh. Therefore, the decision was made to proceed with additional thrombectomy from the popliteal access using the [facility]. The 6 French left popliteal sheath was exchanged over the wire for a 9 French sheath. The is a long take catheter was advanced over the wire and real lytic thrombectomy performed throughout the femoral, common femoral and iliac veins. Follow-up angiography demonstrates significant improvement with restoration of in-line flow. There is a significant residual stenosis at the common femoral vein. This was angioplastied to 12 mm using the 12 x 40 mm Conquest balloon. On the final angiographic runs, there is successful restoration of in-line flow from the popliteal access through the IVC as well as successful recanalization of both iliac veins. All wires and devices were removed. Once the patient's ACT drop below 200, the sheaths were removed. Hemostasis was attained with a combination of manual pressure and pursestring sutures at both 26 [place] sheath sites. 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 Significant stenosis at the transition from the left common to the left external iliac vein 3. Severe ileo caval and left lower  extremity DVT IMPRESSION: 1. Successful recanalization and angioplasty of the chronically occluded right common iliac vein 2. Successful thrombolysis/thrombectomy of the inferior vena cava, left-sided iliac veins and left lower extremity deep venous system to the level of the popliteal vein. 3. Stent assisted angioplasty of a flow limiting stenosis at the interface of the left common and external iliac veins. 4. Angioplasty of a stenotic segment of the left common femoral vein. 5. The IVC filter was retrieved.  No additional filter was placed. PLAN: 1. Continue full-dose heparin and transitioned to weight based Lovenox prior to discharge. 2. Progress toward ambulation as soon as clinically possible. 3. Compression garment or Ace wraps to the left lower extremity if needed to facilitate edema reduction. 4. Follow-up with Dr. Laurence Ferrari in interventional radiology clinic in 2 weeks. 5. If patient demonstrates any evidence of bleeding and needs to come off of his anticoagulation in the future, he must have  an IVC filter replaced. Signed, Criselda Peaches, MD [facility] Radiology Electronically Signed   By: Jacqulynn Cadet M.D.   On: 01/07/2018 10:46   Ir Mariana Arn Ivc  Result Date: 01/07/2018 INDICATION: 57 year-old male with metastatic colon cancer an acute severe iliocaval and left lower extremity DVT. He presents for thrombectomy. EXAM: 1. Ultrasound-guided vascular access left popliteal vein 2. Ultrasound-guided access right common femoral vein 3. Ultrasound-guided access right internal jugular vein 4. Second ultrasound-guided access right internal jugular vein 5. Left lower extremity venogram 6. Right lower extremity venogram 7. Inferior vena cavogram 8. Combined pharmacomechanical thrombolysis/thrombectomy using tPA, the AngioVac device, and the Angiojet device 9. IVC filter retrieval 10. Recanalization and balloon angioplasty of the right common iliac  vein 11. Recanalization and angioplasty with subsequent stent placement in the left iliac vein 12. Balloon angioplasty of the left common femoral and femoral veins Operating Physicians: Corrie Mckusick, DO and Jacqulynn Cadet, MD COMPARISON:  CT scan 12/30/2017 MEDICATIONS: 12 mg tPA administered directly into thrombus throughout the course of the procedure ANESTHESIA/SEDATION: General anesthesia provided by the anesthesiology service FLUOROSCOPY TIME:  Fluoroscopy Time: 67 minutes 6 seconds (816.6 mGy). COMPLICATIONS: None immediate. TECHNIQUE: Informed written consent was obtained from the patient after a thorough discussion of the procedural risks, benefits and alternatives. All questions were addressed. Maximal Sterile Barrier Technique was utilized including caps, mask, sterile gowns, sterile gloves, sterile drape, hand hygiene and skin antiseptic. A timeout was performed prior to the initiation of the procedure. The patient was initially positioned prone. The left popliteal vein was interrogated with ultrasound and found to be widely patent. An image was obtained and stored for the medical record. Local anesthesia was attained by infiltration with 1% lidocaine. A small dermatotomy was made. Under real-time sonographic guidance, the vessel was punctured with a 21 gauge micropuncture needle. Using standard technique, the initial micro needle was exchanged over a 0.018 micro wire for a transitional 4 [place] micro sheath. The micro sheath was then exchanged over a 0.035 wire for a 45 cm 6 [place] braided Arrow sheath. A left lower extremity venogram was performed. The femoral vein is completely occluded in the thigh. The popliteal vein remains patent. 6 mg tPA was laced throughout the thrombus within the common and femoral veins. The patient was repositioned supine and intubated. The right common femoral was interrogated with ultrasound and found to be patent with changes of chronic DVT. An image was obtained and  stored for the medical record. Local anesthesia was attained by infiltration with 1% lidocaine. A small dermatotomy was made. Under real-time sonographic guidance, the vessel was punctured with a 21 gauge micropuncture needle. Using standard technique, the initial micro needle was exchanged over a 0.018 micro wire for a transitional 4 [place] micro sheath. The micro sheath was then exchanged over a 0.035 wire for a 6 French vascular sheath. Right lower extremity venography was performed. The common femoral and external iliac veins are patent. 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 All venous drainage is via parallel spinous collaterals. An angled catheter and glidewire were introduced through the sheath. With some manipulation, the catheter was successfully advanced into the suprarenal IVC. An inferior vena cavagram was performed confirming patency of both renal veins and the super renal IVC. A superstiff Amplatz wire was advanced into the right heart. A 10 x 40 mm Conquest balloon was introduced over the wire the occluded segment of the common iliac vein was angioplastied. Repeat venography demonstrates  recreation of a small vascular channel. A 10 x 40 mm Conquest balloon was then selected and introduced over the wire. Venography demonstrates successful recanalization of the common iliac vein. There is flow through the IVC. 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 The 6 French sheath was exchanged in the soft tissue tract dilated to 24 [place]. A 26 French Gore dry seal sheath was then advanced over the wire and positioned in the right common iliac vein. The right internal jugular vein was interrogated with ultrasound and found to be widely patent. An image was obtained and stored for the medical record. Local anesthesia was attained by infiltration with 1% lidocaine. A small dermatotomy was made. Under real-time sonographic guidance, the vessel was  punctured with a 21 gauge micropuncture needle. Using standard technique, the initial micro needle was exchanged over a 0.018 micro wire for a transitional 4 [place] micro sheath. The micro sheath was then exchanged over a 0.035 wire for a 6 French vascular sheath. A superstiff Amplatz wire was advanced into the suprarenal IVC. The skin tract was then serially dilated and an 41 French reperfusion catheter was advanced and position with the tip at the cavoatrial junction. At this time, the patient was fully heparinized until the ACT level was greater than 300 and the AngioVac circuit was established. The angio [facility] device was advanced through the 43 French sheath from the right common femoral approach and suction thrombectomy was performed throughout the infrarenal inferior vena cava. Working from the popliteal access, additional left lower extremity venography images were performed focusing on the iliac and common femoral vasculature. Thrombus is present within the common femoral vein and the external iliac vein. The left common iliac vein is completely occluded similar to the other side. Attempts were made to pass through the occluded left common iliac vein from below. This was unsuccessful. Therefore, a second right internal jugular venous access was required. The right internal jugular vein was was again interrogated with ultrasound and found to be widely patent. An image was obtained and stored for the medical record. Local anesthesia was attained by infiltration with 1% lidocaine. A small dermatotomy was made. Under real-time sonographic guidance, the vessel was punctured with a 21 gauge micropuncture needle. Using standard technique, the initial micro needle was exchanged over a 0.018 micro wire for a transitional 4 [place] micro sheath. The micro sheath was then exchanged over a 0.035 wire for a 6 French vascular sheath. An angled catheter and glidewire were advanced through the sheath, through the IVC filter,  through the infrarenal IVC and the catheter successfully navigated across the chronically occluded left common iliac vein. The glidewire was then exchanged for an exchange length Glidewire and snared from the femoral vein in the midthigh and brought through the popliteal sheath access resulting in successful through and through access from the right internal jugular vein through the left popliteal vein. A catheter was then advanced over the wire into the femoral vein and the glidewire exchanged for a superstiff Amplatz wire. At this time, another cavagram was performed demonstrating successful debulking of the caval thrombus. At this point it was decided to proceed with removal of the IVC filter. Over the superstiff Amplatz wire from the right jugular vein approach, the skin tract was dilated and a second 26 [place] Gore dry seal sheath was advanced and positioned in the suprarenal IVC. A Bard IVC filter retrieval set was then advanced coaxially through this device next to the  Amplatz wire. The Bard [place] IVC filter was successfully retrieved using standard technique. Additional thrombectomy was then performed using the angio VAC device. This yielded significant material. Repeat vena cavagram demonstrates complete removal of thrombus from the inferior vena cava. Attention was next turned to the occluded left common iliac artery. The perfusion circuit was reversed and the reperfusion catheter was placed in the right femoral sheath and the angio back device was advanced through the right IJ sheath. Suction thrombectomy was performed in the right common iliac vein. Working from the popliteal access, angioplasty was performed of the common femoral, external iliac and common iliac veins and thrombus was pushed into the AngioVac. There appears to be a significant stenosis at the transition from the common iliac to the external iliac vein. This was resistant to initial angioplasty, so a 14 x 60 mm wall stent was selected,  placed across the lesion and deployed. The wall stent was then post dilated to 12 mm using the 12 by 40 Conquest balloon. Catheter venography demonstrates significant near occlusive thrombus throughout the common femoral vein, and femoral vein in the thigh. Therefore, the decision was made to proceed with additional thrombectomy from the popliteal access using the [facility]. The 6 French left popliteal sheath was exchanged over the wire for a 9 French sheath. The is a long take catheter was advanced over the wire and real lytic thrombectomy performed throughout the femoral, common femoral and iliac veins. Follow-up angiography demonstrates significant improvement with restoration of in-line flow. There is a significant residual stenosis at the common femoral vein. This was angioplastied to 12 mm using the 12 x 40 mm Conquest balloon. On the final angiographic runs, there is successful restoration of in-line flow from the popliteal access through the IVC as well as successful recanalization of both iliac veins. All wires and devices were removed. Once the patient's ACT drop below 200, the sheaths were removed. Hemostasis was attained with a combination of manual pressure and pursestring sutures at both 26 [place] sheath sites. 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 Significant stenosis at the transition from the left common to the left external iliac vein 3. 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 Successful recanalization and angioplasty of the chronically occluded right common iliac vein 2. Successful thrombolysis/thrombectomy of the inferior vena cava, left-sided iliac veins and left lower extremity deep venous system to the level of the popliteal vein. 3. Stent assisted angioplasty of a flow limiting stenosis at the interface of the left common and external iliac veins. 4. Angioplasty of a stenotic segment of the left common femoral vein. 5.  The IVC filter was retrieved.  No additional filter was placed. PLAN: 1. Continue full-dose heparin and transitioned to weight based Lovenox prior to discharge. 2. Progress toward ambulation as soon as clinically possible. 3. Compression garment or Ace wraps to the left lower extremity if needed to facilitate edema reduction. 4. Follow-up with Dr. Laurence Ferrari in interventional radiology clinic in 2 weeks. 5. If patient demonstrates any evidence of bleeding and needs to come off of his anticoagulation in the future, he must have an IVC filter replaced. Signed, Criselda Peaches, MD [facility] Radiology Electronically Signed   By: Jacqulynn Cadet M.D.   On: 01/07/2018 10:46   Ir Thrombect Veno Mech Mod Sed  Result Date: 01/07/2018 INDICATION: 57 year-old male with metastatic colon cancer an acute severe iliocaval and left lower extremity  DVT. He presents for thrombectomy. EXAM: 1. Ultrasound-guided vascular access left popliteal vein 2. Ultrasound-guided access right common femoral vein 3. Ultrasound-guided access right internal jugular vein 4. Second ultrasound-guided access right internal jugular vein 5. Left lower extremity venogram 6. Right lower extremity venogram 7. Inferior vena cavogram 8. Combined pharmacomechanical thrombolysis/thrombectomy using tPA, the AngioVac device, and the Angiojet device 9. IVC filter retrieval 10. Recanalization and balloon angioplasty of the right common iliac vein 11. Recanalization and angioplasty with subsequent stent placement in the left iliac vein 12. Balloon angioplasty of the left common femoral and femoral veins Operating Physicians: Corrie Mckusick, DO and Jacqulynn Cadet, MD COMPARISON:  CT scan 12/30/2017 MEDICATIONS: 12 mg tPA administered directly into thrombus throughout the course of the procedure ANESTHESIA/SEDATION: General anesthesia provided by the anesthesiology service FLUOROSCOPY TIME:  Fluoroscopy Time:  67 minutes 6 seconds (816.6 mGy). COMPLICATIONS: None immediate. TECHNIQUE: Informed written consent was obtained from the patient after a thorough discussion of the procedural risks, benefits and alternatives. All questions were addressed. Maximal Sterile Barrier Technique was utilized including caps, mask, sterile gowns, sterile gloves, sterile drape, hand hygiene and skin antiseptic. A timeout was performed prior to the initiation of the procedure. The patient was initially positioned prone. The left popliteal vein was interrogated with ultrasound and found to be widely patent. An image was obtained and stored for the medical record. Local anesthesia was attained by infiltration with 1% lidocaine. A small dermatotomy was made. Under real-time sonographic guidance, the vessel was punctured with a 21 gauge micropuncture needle. Using standard technique, the initial micro needle was exchanged over a 0.018 micro wire for a transitional 4 [place] micro sheath. The micro sheath was then exchanged over a 0.035 wire for a 45 cm 6 [place] braided Arrow sheath. A left lower extremity venogram was performed. The femoral vein is completely occluded in the thigh. The popliteal vein remains patent. 6 mg tPA was laced throughout the thrombus within the common and femoral veins. The patient was repositioned supine and intubated. The right common femoral was interrogated with ultrasound and found to be patent with changes of chronic DVT. An image was obtained and stored for the medical record. Local anesthesia was attained by infiltration with 1% lidocaine. A small dermatotomy was made. Under real-time sonographic guidance, the vessel was punctured with a 21 gauge micropuncture needle. Using standard technique, the initial micro needle was exchanged over a 0.018 micro wire for a transitional 4 [place] micro sheath. The micro sheath was then exchanged over a 0.035 wire for a 6 French vascular sheath. Right lower extremity venography  was performed. The common femoral and external iliac veins are patent. 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 All venous drainage is via parallel spinous collaterals. An angled catheter and glidewire were introduced through the sheath. With some manipulation, the catheter was successfully advanced into the suprarenal IVC. An inferior vena cavagram was performed confirming patency of both renal veins and the super renal IVC. A superstiff Amplatz wire was advanced into the right heart. A 10 x 40 mm Conquest balloon was introduced over the wire the occluded segment of the common iliac vein was angioplastied. Repeat venography demonstrates recreation of a small vascular channel. A 10 x 40 mm Conquest balloon was then selected and introduced over the wire. Venography demonstrates successful recanalization of the common iliac vein. There is flow through the IVC. 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 The 6 French sheath was exchanged  in the soft tissue tract dilated to 24 [place]. A 26 French Gore dry seal sheath was then advanced over the wire and positioned in the right common iliac vein. The right internal jugular vein was interrogated with ultrasound and found to be widely patent. An image was obtained and stored for the medical record. Local anesthesia was attained by infiltration with 1% lidocaine. A small dermatotomy was made. Under real-time sonographic guidance, the vessel was punctured with a 21 gauge micropuncture needle. Using standard technique, the initial micro needle was exchanged over a 0.018 micro wire for a transitional 4 [place] micro sheath. The micro sheath was then exchanged over a 0.035 wire for a 6 French vascular sheath. A superstiff Amplatz wire was advanced into the suprarenal IVC. The skin tract was then serially dilated and an 16 French reperfusion catheter was advanced and position with the tip at the cavoatrial junction. At this  time, the patient was fully heparinized until the ACT level was greater than 300 and the AngioVac circuit was established. The angio [facility] device was advanced through the 10 French sheath from the right common femoral approach and suction thrombectomy was performed throughout the infrarenal inferior vena cava. Working from the popliteal access, additional left lower extremity venography images were performed focusing on the iliac and common femoral vasculature. Thrombus is present within the common femoral vein and the external iliac vein. The left common iliac vein is completely occluded similar to the other side. Attempts were made to pass through the occluded left common iliac vein from below. This was unsuccessful. Therefore, a second right internal jugular venous access was required. The right internal jugular vein was was again interrogated with ultrasound and found to be widely patent. An image was obtained and stored for the medical record. Local anesthesia was attained by infiltration with 1% lidocaine. A small dermatotomy was made. Under real-time sonographic guidance, the vessel was punctured with a 21 gauge micropuncture needle. Using standard technique, the initial micro needle was exchanged over a 0.018 micro wire for a transitional 4 [place] micro sheath. The micro sheath was then exchanged over a 0.035 wire for a 6 French vascular sheath. An angled catheter and glidewire were advanced through the sheath, through the IVC filter, through the infrarenal IVC and the catheter successfully navigated across the chronically occluded left common iliac vein. The glidewire was then exchanged for an exchange length Glidewire and snared from the femoral vein in the midthigh and brought through the popliteal sheath access resulting in successful through and through access from the right internal jugular vein through the left popliteal vein. A catheter was then advanced over the wire into the femoral vein and the  glidewire exchanged for a superstiff Amplatz wire. At this time, another cavagram was performed demonstrating successful debulking of the caval thrombus. At this point it was decided to proceed with removal of the IVC filter. Over the superstiff Amplatz wire from the right jugular vein approach, the skin tract was dilated and a second 26 [place] Gore dry seal sheath was advanced and positioned in the suprarenal IVC. A Bard IVC filter retrieval set was then advanced coaxially through this device next to the Amplatz wire. The Bard [place] IVC filter was successfully retrieved using standard technique. Additional thrombectomy was then performed using the angio VAC device. This yielded significant material. Repeat vena cavagram demonstrates complete removal of thrombus from the inferior vena cava. Attention was next turned to the occluded left common iliac artery. The perfusion circuit was  reversed and the reperfusion catheter was placed in the right femoral sheath and the angio back device was advanced through the right IJ sheath. Suction thrombectomy was performed in the right common iliac vein. Working from the popliteal access, angioplasty was performed of the common femoral, external iliac and common iliac veins and thrombus was pushed into the AngioVac. There appears to be a significant stenosis at the transition from the common iliac to the external iliac vein. This was resistant to initial angioplasty, so a 14 x 60 mm wall stent was selected, placed across the lesion and deployed. The wall stent was then post dilated to 12 mm using the 12 by 40 Conquest balloon. Catheter venography demonstrates significant near occlusive thrombus throughout the common femoral vein, and femoral vein in the thigh. Therefore, the decision was made to proceed with additional thrombectomy from the popliteal access using the [employer]. The 6 French left popliteal sheath was exchanged over the wire for a 9 French  sheath. The is a long take catheter was advanced over the wire and real lytic thrombectomy performed throughout the femoral, common femoral and iliac veins. Follow-up angiography demonstrates significant improvement with restoration of in-line flow. There is a significant residual stenosis at the common femoral vein. This was angioplastied to 12 mm using the 12 x 40 mm Conquest balloon. On the final angiographic runs, there is successful restoration of in-line flow from the popliteal access through the IVC as well as successful recanalization of both iliac veins. All wires and devices were removed. Once the patient's ACT drop below 200, the sheaths were removed. Hemostasis was attained with a combination of manual pressure and pursestring sutures at both 26 [place] sheath sites. 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 Significant stenosis at the transition from the left common to the left external iliac vein 3. 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 Successful recanalization and angioplasty of the chronically occluded right common iliac vein 2. Successful thrombolysis/thrombectomy of the inferior vena cava, left-sided iliac veins and left lower extremity deep venous system to the level of the popliteal vein. 3. Stent assisted angioplasty of a flow limiting stenosis at the interface of the left common and external iliac veins. 4. Angioplasty of a stenotic segment of the left common femoral vein. 5. The IVC filter was retrieved.  No additional filter was placed. PLAN: 1. Continue full-dose heparin and transitioned to weight based Lovenox prior to discharge. 2. Progress toward ambulation as soon as clinically possible. 3. Compression garment or Ace wraps to the left lower extremity if needed to facilitate edema reduction. 4. Follow-up with Dr. Laurence Ferrari in interventional radiology clinic in 2 weeks. 5. If patient demonstrates any evidence of bleeding and needs to come off  of his anticoagulation in the future, he must have an IVC filter replaced. Signed, Criselda Peaches, MD [facility] Radiology Electronically Signed   By: Jacqulynn Cadet M.D.   On: 01/07/2018 10:46   Ir US Guide Vasc Access Left  Result Date: 01/07/2018 INDICATION: 57 year-old male with metastatic colon cancer an acute severe iliocaval and left lower extremity DVT. He presents for thrombectomy. EXAM: 1. Ultrasound-guided vascular access left popliteal vein 2. Ultrasound-guided access right common femoral vein 3. Ultrasound-guided access right internal jugular vein 4. Second ultrasound-guided access right internal jugular vein 5. Left lower extremity venogram 6. Right lower extremity venogram 7. Inferior vena cavogram 8. Combined pharmacomechanical thrombolysis/thrombectomy using tPA,  the AngioVac device, and the Angiojet device 9. IVC filter retrieval 10. Recanalization and balloon angioplasty of the right common iliac vein 11. Recanalization and angioplasty with subsequent stent placement in the left iliac vein 12. Balloon angioplasty of the left common femoral and femoral veins Operating Physicians: Corrie Mckusick, DO and Jacqulynn Cadet, MD COMPARISON:  CT scan 12/30/2017 MEDICATIONS: 12 mg tPA administered directly into thrombus throughout the course of the procedure ANESTHESIA/SEDATION: General anesthesia provided by the anesthesiology service FLUOROSCOPY TIME:  Fluoroscopy Time: 67 minutes 6 seconds (816.6 mGy). COMPLICATIONS: None immediate. TECHNIQUE: Informed written consent was obtained from the patient after a thorough discussion of the procedural risks, benefits and alternatives. All questions were addressed. Maximal Sterile Barrier Technique was utilized including caps, mask, sterile gowns, sterile gloves, sterile drape, hand hygiene and skin antiseptic. A timeout was performed prior to the initiation of the procedure. The patient was initially  positioned prone. The left popliteal vein was interrogated with ultrasound and found to be widely patent. An image was obtained and stored for the medical record. Local anesthesia was attained by infiltration with 1% lidocaine. A small dermatotomy was made. Under real-time sonographic guidance, the vessel was punctured with a 21 gauge micropuncture needle. Using standard technique, the initial micro needle was exchanged over a 0.018 micro wire for a transitional 4 [place] micro sheath. The micro sheath was then exchanged over a 0.035 wire for a 45 cm 6 [place] braided Arrow sheath. A left lower extremity venogram was performed. The femoral vein is completely occluded in the thigh. The popliteal vein remains patent. 6 mg tPA was laced throughout the thrombus within the common and femoral veins. The patient was repositioned supine and intubated. The right common femoral was interrogated with ultrasound and found to be patent with changes of chronic DVT. An image was obtained and stored for the medical record. Local anesthesia was attained by infiltration with 1% lidocaine. A small dermatotomy was made. Under real-time sonographic guidance, the vessel was punctured with a 21 gauge micropuncture needle. Using standard technique, the initial micro needle was exchanged over a 0.018 micro wire for a transitional 4 [place] micro sheath. The micro sheath was then exchanged over a 0.035 wire for a 6 French vascular sheath. Right lower extremity venography was performed. The common femoral and external iliac veins are patent. 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 All venous drainage is via parallel spinous collaterals. An angled catheter and glidewire were introduced through the sheath. With some manipulation, the catheter was successfully advanced into the suprarenal IVC. An inferior vena cavagram was performed confirming patency of both renal veins and the super renal IVC. A superstiff Amplatz wire was advanced into  the right heart. A 10 x 40 mm Conquest balloon was introduced over the wire the occluded segment of the common iliac vein was angioplastied. Repeat venography demonstrates recreation of a small vascular channel. A 10 x 40 mm Conquest balloon was then selected and introduced over the wire. Venography demonstrates successful recanalization of the common iliac vein. There is flow through the IVC. 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 The 6 French sheath was exchanged in the soft tissue tract dilated to 24 [place]. A 26 French Gore dry seal sheath was then advanced over the wire and positioned in the right common iliac vein. The right internal jugular vein was interrogated with ultrasound and found to be widely patent. An image was obtained and stored for the medical record.  Local anesthesia was attained by infiltration with 1% lidocaine. A small dermatotomy was made. Under real-time sonographic guidance, the vessel was punctured with a 21 gauge micropuncture needle. Using standard technique, the initial micro needle was exchanged over a 0.018 micro wire for a transitional 4 [place] micro sheath. The micro sheath was then exchanged over a 0.035 wire for a 6 French vascular sheath. A superstiff Amplatz wire was advanced into the suprarenal IVC. The skin tract was then serially dilated and an 73 French reperfusion catheter was advanced and position with the tip at the cavoatrial junction. At this time, the patient was fully heparinized until the ACT level was greater than 300 and the AngioVac circuit was established. The angio [facility] device was advanced through the 47 French sheath from the right common femoral approach and suction thrombectomy was performed throughout the infrarenal inferior vena cava. Working from the popliteal access, additional left lower extremity venography images were performed focusing on the iliac and common femoral vasculature. Thrombus is  present within the common femoral vein and the external iliac vein. The left common iliac vein is completely occluded similar to the other side. Attempts were made to pass through the occluded left common iliac vein from below. This was unsuccessful. Therefore, a second right internal jugular venous access was required. The right internal jugular vein was was again interrogated with ultrasound and found to be widely patent. An image was obtained and stored for the medical record. Local anesthesia was attained by infiltration with 1% lidocaine. A small dermatotomy was made. Under real-time sonographic guidance, the vessel was punctured with a 21 gauge micropuncture needle. Using standard technique, the initial micro needle was exchanged over a 0.018 micro wire for a transitional 4 [place] micro sheath. The micro sheath was then exchanged over a 0.035 wire for a 6 French vascular sheath. An angled catheter and glidewire were advanced through the sheath, through the IVC filter, through the infrarenal IVC and the catheter successfully navigated across the chronically occluded left common iliac vein. The glidewire was then exchanged for an exchange length Glidewire and snared from the femoral vein in the midthigh and brought through the popliteal sheath access resulting in successful through and through access from the right internal jugular vein through the left popliteal vein. A catheter was then advanced over the wire into the femoral vein and the glidewire exchanged for a superstiff Amplatz wire. At this time, another cavagram was performed demonstrating successful debulking of the caval thrombus. At this point it was decided to proceed with removal of the IVC filter. Over the superstiff Amplatz wire from the right jugular vein approach, the skin tract was dilated and a second 26 [place] Gore dry seal sheath was advanced and positioned in the suprarenal IVC. A Bard IVC filter retrieval set was then advanced coaxially  through this device next to the Amplatz wire. The Bard Addy IVC filter was successfully retrieved using standard technique. Additional thrombectomy was then performed using the angio VAC device. This yielded significant material. Repeat vena cavagram demonstrates complete removal of thrombus from the inferior vena cava. Attention was next turned to the occluded left common iliac artery. The perfusion circuit was reversed and the reperfusion catheter was placed in the right femoral sheath and the angio back device was advanced through the right IJ sheath. Suction thrombectomy was performed in the right common iliac vein. Working from the popliteal access, angioplasty was performed of the common femoral, external iliac and common iliac veins and thrombus was  pushed into the AngioVac. There appears to be a significant stenosis at the transition from the common iliac to the external iliac vein. This was resistant to initial angioplasty, so a 14 x 60 mm wall stent was selected, placed across the lesion and deployed. The wall stent was then post dilated to 12 mm using the 12 by 40 Conquest balloon. Catheter venography demonstrates significant near occlusive thrombus throughout the common femoral vein, and femoral vein in the thigh. Therefore, the decision was made to proceed with additional thrombectomy from the popliteal access using the [facility]. The 6 French left popliteal sheath was exchanged over the wire for a 9 French sheath. The is a long take catheter was advanced over the wire and real lytic thrombectomy performed throughout the femoral, common femoral and iliac veins. Follow-up angiography demonstrates significant improvement with restoration of in-line flow. There is a significant residual stenosis at the common femoral vein. This was angioplastied to 12 mm using the 12 x 40 mm Conquest balloon. On the final angiographic runs, there is successful restoration of in-line flow from the  popliteal access through the IVC as well as successful recanalization of both iliac veins. All wires and devices were removed. Once the patient's ACT drop below 200, the sheaths were removed. Hemostasis was attained with a combination of manual pressure and pursestring sutures at both 26 [place] sheath sites. 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 Significant stenosis at the transition from the left common to the left external iliac vein 3. 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 Successful recanalization and angioplasty of the chronically occluded right common iliac vein 2. Successful thrombolysis/thrombectomy of the inferior vena cava, left-sided iliac veins and left lower extremity deep venous system to the level of the popliteal vein. 3. Stent assisted angioplasty of a flow limiting stenosis at the interface of the left common and external iliac veins. 4. Angioplasty of a stenotic segment of the left common femoral vein. 5. The IVC filter was retrieved.  No additional filter was placed. PLAN: 1. Continue full-dose heparin and transitioned to weight based Lovenox prior to discharge. 2. Progress toward ambulation as soon as clinically possible. 3. Compression garment or Ace wraps to the left lower extremity if needed to facilitate edema reduction. 4. Follow-up with Dr. Laurence Ferrari in interventional radiology clinic in 2 weeks. 5. If patient demonstrates any evidence of bleeding and needs to come off of his anticoagulation in the future, he must have an IVC filter replaced. Signed, Criselda Peaches, MD [facility] Radiology Electronically Signed   By: Jacqulynn Cadet M.D.   On: 01/07/2018 10:46   Ir US Guide Vasc Access Right  Result Date: 01/07/2018 INDICATION: 57 year-old male with metastatic colon cancer an acute severe iliocaval and left lower extremity DVT. He presents for thrombectomy. EXAM: 1.  Ultrasound-guided vascular access left popliteal vein 2. Ultrasound-guided access right common femoral vein 3. Ultrasound-guided access right internal jugular vein 4. Second ultrasound-guided access right internal jugular vein 5. Left lower extremity venogram 6. Right lower extremity venogram 7. Inferior vena cavogram 8. Combined pharmacomechanical thrombolysis/thrombectomy using tPA, the AngioVac device, and the Angiojet device 9. IVC filter retrieval 10. Recanalization and balloon angioplasty of the right common iliac vein 11. Recanalization and angioplasty with subsequent stent placement in the left iliac vein 12. Balloon angioplasty of the left common femoral and femoral veins Operating Physicians: Corrie Mckusick, DO and Jacqulynn Cadet, MD  COMPARISON:  CT scan 12/30/2017 MEDICATIONS: 12 mg tPA administered directly into thrombus throughout the course of the procedure ANESTHESIA/SEDATION: General anesthesia provided by the anesthesiology service FLUOROSCOPY TIME:  Fluoroscopy Time: 67 minutes 6 seconds (816.6 mGy). COMPLICATIONS: None immediate. TECHNIQUE: Informed written consent was obtained from the patient after a thorough discussion of the procedural risks, benefits and alternatives. All questions were addressed. Maximal Sterile Barrier Technique was utilized including caps, mask, sterile gowns, sterile gloves, sterile drape, hand hygiene and skin antiseptic. A timeout was performed prior to the initiation of the procedure. The patient was initially positioned prone. The left popliteal vein was interrogated with ultrasound and found to be widely patent. An image was obtained and stored for the medical record. Local anesthesia was attained by infiltration with 1% lidocaine. A small dermatotomy was made. Under real-time sonographic guidance, the vessel was punctured with a 21 gauge micropuncture needle. Using standard technique, the initial micro needle was exchanged over a 0.018 micro wire for a transitional  4 [place] micro sheath. The micro sheath was then exchanged over a 0.035 wire for a 45 cm 6 [place] braided Arrow sheath. A left lower extremity venogram was performed. The femoral vein is completely occluded in the thigh. The popliteal vein remains patent. 6 mg tPA was laced throughout the thrombus within the common and femoral veins. The patient was repositioned supine and intubated. The right common femoral was interrogated with ultrasound and found to be patent with changes of chronic DVT. An image was obtained and stored for the medical record. Local anesthesia was attained by infiltration with 1% lidocaine. A small dermatotomy was made. Under real-time sonographic guidance, the vessel was punctured with a 21 gauge micropuncture needle. Using standard technique, the initial micro needle was exchanged over a 0.018 micro wire for a transitional 4 [place] micro sheath. The micro sheath was then exchanged over a 0.035 wire for a 6 French vascular sheath. Right lower extremity venography was performed. The common femoral and external iliac veins are patent. 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 All venous drainage is via parallel spinous collaterals. An angled catheter and glidewire were introduced through the sheath. With some manipulation, the catheter was successfully advanced into the suprarenal IVC. An inferior vena cavagram was performed confirming patency of both renal veins and the super renal IVC. A superstiff Amplatz wire was advanced into the right heart. A 10 x 40 mm Conquest balloon was introduced over the wire the occluded segment of the common iliac vein was angioplastied. Repeat venography demonstrates recreation of a small vascular channel. A 10 x 40 mm Conquest balloon was then selected and introduced over the wire. Venography demonstrates successful recanalization of the common iliac vein. There is flow through the IVC. Extensive filling defects within the vena cava filter consistent with  known caval thrombosis. The 6 French sheath was exchanged in the soft tissue tract dilated to 24 [place]. A 26 French Gore dry seal sheath was then advanced over the wire and positioned in the right common iliac vein. The right internal jugular vein was interrogated with ultrasound and found to be widely patent. An image was obtained and stored for the medical record. Local anesthesia was attained by infiltration with 1% lidocaine. A small dermatotomy was made. Under real-time sonographic guidance, the vessel was punctured with a 21 gauge micropuncture needle. Using standard technique, the initial micro needle was exchanged over a 0.018 micro wire for a transitional 4 [place] micro sheath. The micro sheath was then exchanged  over a 0.035 wire for a 6 [place] vascular sheath. A superstiff Amplatz wire was advanced into the suprarenal IVC. The skin tract was then serially dilated and an 39 French reperfusion catheter was advanced and position with the tip at the cavoatrial junction. At this time, the patient was fully heparinized until the ACT level was greater than 300 and the AngioVac circuit was established. The angio [facility] device was advanced through the 89 French sheath from the right common femoral approach and suction thrombectomy was performed throughout the infrarenal inferior vena cava. Working from the popliteal access, additional left lower extremity venography images were performed focusing on the iliac and common femoral vasculature. Thrombus is present within the common femoral vein and the external iliac vein. The left common iliac vein is completely occluded similar to the other side. Attempts were made to pass through the occluded left common iliac vein from below. This was unsuccessful. Therefore, a second right internal jugular venous access was required. The right internal jugular vein was was again interrogated with ultrasound and found to be widely patent. An image was obtained and stored for the  medical record. Local anesthesia was attained by infiltration with 1% lidocaine. A small dermatotomy was made. Under real-time sonographic guidance, the vessel was punctured with a 21 gauge micropuncture needle. Using standard technique, the initial micro needle was exchanged over a 0.018 micro wire for a transitional 4 [place] micro sheath. The micro sheath was then exchanged over a 0.035 wire for a 6 French vascular sheath. An angled catheter and glidewire were advanced through the sheath, through the IVC filter, through the infrarenal IVC and the catheter successfully navigated across the chronically occluded left common iliac vein. The glidewire was then exchanged for an exchange length Glidewire and snared from the femoral vein in the midthigh and brought through the popliteal sheath access resulting in successful through and through access from the right internal jugular vein through the left popliteal vein. A catheter was then advanced over the wire into the femoral vein and the glidewire exchanged for a superstiff Amplatz wire. At this time, another cavagram was performed demonstrating successful debulking of the caval thrombus. At this point it was decided to proceed with removal of the IVC filter. Over the superstiff Amplatz wire from the right jugular vein approach, the skin tract was dilated and a second 26 [place] Gore dry seal sheath was advanced and positioned in the suprarenal IVC. A Bard IVC filter retrieval set was then advanced coaxially through this device next to the Amplatz wire. The Bard [facility] IVC filter was successfully retrieved using standard technique. Additional thrombectomy was then performed using the angio VAC device. This yielded significant material. Repeat vena cavagram demonstrates complete removal of thrombus from the inferior vena cava. Attention was next turned to the occluded left common iliac artery. The perfusion circuit was reversed and the reperfusion catheter was placed in  the right femoral sheath and the angio back device was advanced through the right IJ sheath. Suction thrombectomy was performed in the right common iliac vein. Working from the popliteal access, angioplasty was performed of the common femoral, external iliac and common iliac veins and thrombus was pushed into the AngioVac. There appears to be a significant stenosis at the transition from the common iliac to the external iliac vein. This was resistant to initial angioplasty, so a 14 x 60 mm wall stent was selected, placed across the lesion and deployed. The wall stent was then post dilated to 12 mm  using the 12 by 40 Conquest balloon. Catheter venography demonstrates significant near occlusive thrombus throughout the common femoral vein, and femoral vein in the thigh. Therefore, the decision was made to proceed with additional thrombectomy from the popliteal access using the [facility]. The 6 French left popliteal sheath was exchanged over the wire for a 9 French sheath. The is a long take catheter was advanced over the wire and real lytic thrombectomy performed throughout the femoral, common femoral and iliac veins. Follow-up angiography demonstrates significant improvement with restoration of in-line flow. There is a significant residual stenosis at the common femoral vein. This was angioplastied to 12 mm using the 12 x 40 mm Conquest balloon. On the final angiographic runs, there is successful restoration of in-line flow from the popliteal access through the IVC as well as successful recanalization of both iliac veins. All wires and devices were removed. Once the patient's ACT drop below 200, the sheaths were removed. Hemostasis was attained with a combination of manual pressure and pursestring sutures at both 26 [place] sheath sites. 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 Significant stenosis at the transition from the left common to the left external iliac vein 3.  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 Successful recanalization and angioplasty of the chronically occluded right common iliac vein 2. Successful thrombolysis/thrombectomy of the inferior vena cava, left-sided iliac veins and left lower extremity deep venous system to the level of the popliteal vein. 3. Stent assisted angioplasty of a flow limiting stenosis at the interface of the left common and external iliac veins. 4. Angioplasty of a stenotic segment of the left common femoral vein. 5. The IVC filter was retrieved.  No additional filter was placed. PLAN: 1. Continue full-dose heparin and transitioned to weight based Lovenox prior to discharge. 2. Progress toward ambulation as soon as clinically possible. 3. Compression garment or Ace wraps to the left lower extremity if needed to facilitate edema reduction. 4. Follow-up with Dr. Laurence Ferrari in interventional radiology clinic in 2 weeks. 5. If patient demonstrates any evidence of bleeding and needs to come off of his anticoagulation in the future, he must have an IVC filter replaced. Signed, Criselda Peaches, MD [facility] Inter[facility]iology [facility] Radiology Electronically Signed   By: Jacqulynn Cadet M.D.   On: 01/07/2018 10:46   Ir US Guide Vasc Access Right  Result Date: 01/07/2018 INDICATION: 57 year-old male with metastatic colon cancer an acute severe iliocaval and left lower extremity DVT. He presents for thrombectomy. EXAM: 1. Ultrasound-guided vascular access left popliteal vein 2. Ultrasound-guided access right common femoral vein 3. Ultrasound-guided access right internal jugular vein 4. Second ultrasound-guided access right internal jugular vein 5. Left lower extremity venogram 6. Right lower extremity venogram 7. Inferior vena cavogram 8. Combined pharmacomechanical thrombolysis/thrombectomy using tPA, the AngioVac device, and the Angiojet device 9. IVC filter retrieval 10. Recanalization and  balloon angioplasty of the right common iliac vein 11. Recanalization and angioplasty with subsequent stent placement in the left iliac vein 12. Balloon angioplasty of the left common femoral and femoral veins Operating Physicians: Corrie Mckusick, DO and Jacqulynn Cadet, MD COMPARISON:  CT scan 12/30/2017 MEDICATIONS: 12 mg tPA administered directly into thrombus throughout the course of the procedure ANESTHESIA/SEDATION: General anesthesia provided by the anesthesiology service FLUOROSCOPY TIME:  Fluoroscopy Time: 67 minutes 6 seconds (816.6 mGy). COMPLICATIONS: None immediate. TECHNIQUE: Informed written consent was obtained from the patient after a thorough discussion of  the procedural risks, benefits and alternatives. All questions were addressed. Maximal Sterile Barrier Technique was utilized including caps, mask, sterile gowns, sterile gloves, sterile drape, hand hygiene and skin antiseptic. A timeout was performed prior to the initiation of the procedure. The patient was initially positioned prone. The left popliteal vein was interrogated with ultrasound and found to be widely patent. An image was obtained and stored for the medical record. Local anesthesia was attained by infiltration with 1% lidocaine. A small dermatotomy was made. Under real-time sonographic guidance, the vessel was punctured with a 21 gauge micropuncture needle. Using standard technique, the initial micro needle was exchanged over a 0.018 micro wire for a transitional 4 [place] micro sheath. The micro sheath was then exchanged over a 0.035 wire for a 45 cm 6 [place] braided Arrow sheath. A left lower extremity venogram was performed. The femoral vein is completely occluded in the thigh. The popliteal vein remains patent. 6 mg tPA was laced throughout the thrombus within the common and femoral veins. The patient was repositioned supine and intubated. The right common femoral was interrogated with ultrasound and found to be patent with changes  of chronic DVT. An image was obtained and stored for the medical record. Local anesthesia was attained by infiltration with 1% lidocaine. A small dermatotomy was made. Under real-time sonographic guidance, the vessel was punctured with a 21 gauge micropuncture needle. Using standard technique, the initial micro needle was exchanged over a 0.018 micro wire for a transitional 4 [place] micro sheath. The micro sheath was then exchanged over a 0.035 wire for a 6 French vascular sheath. Right lower extremity venography was performed. The common femoral and external iliac veins are patent. 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 All venous drainage is via parallel spinous collaterals. An angled catheter and glidewire were introduced through the sheath. With some manipulation, the catheter was successfully advanced into the suprarenal IVC. An inferior vena cavagram was performed confirming patency of both renal veins and the super renal IVC. A superstiff Amplatz wire was advanced into the right heart. A 10 x 40 mm Conquest balloon was introduced over the wire the occluded segment of the common iliac vein was angioplastied. Repeat venography demonstrates recreation of a small vascular channel. A 10 x 40 mm Conquest balloon was then selected and introduced over the wire. Venography demonstrates successful recanalization of the common iliac vein. There is flow through the IVC. 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 The 6 French sheath was exchanged in the soft tissue tract dilated to 24 [place]. A 26 French Gore dry seal sheath was then advanced over the wire and positioned in the right common iliac vein. The right internal jugular vein was interrogated with ultrasound and found to be widely patent. An image was obtained and stored for the medical record. Local anesthesia was attained by infiltration with 1% lidocaine. A small dermatotomy was made. Under real-time  sonographic guidance, the vessel was punctured with a 21 gauge micropuncture needle. Using standard technique, the initial micro needle was exchanged over a 0.018 micro wire for a transitional 4 [place] micro sheath. The micro sheath was then exchanged over a 0.035 wire for a 6 French vascular sheath. A superstiff Amplatz wire was advanced into the suprarenal IVC. The skin tract was then serially dilated and an 64 French reperfusion catheter was advanced and position with the tip at the cavoatrial junction. At this time, the patient was fully heparinized until the ACT  level was greater than 300 and the AngioVac circuit was established. The angio [facility] device was advanced through the 76 French sheath from the right common femoral approach and suction thrombectomy was performed throughout the infrarenal inferior vena cava. Working from the popliteal access, additional left lower extremity venography images were performed focusing on the iliac and common femoral vasculature. Thrombus is present within the common femoral vein and the external iliac vein. The left common iliac vein is completely occluded similar to the other side. Attempts were made to pass through the occluded left common iliac vein from below. This was unsuccessful. Therefore, a second right internal jugular venous access was required. The right internal jugular vein was was again interrogated with ultrasound and found to be widely patent. An image was obtained and stored for the medical record. Local anesthesia was attained by infiltration with 1% lidocaine. A small dermatotomy was made. Under real-time sonographic guidance, the vessel was punctured with a 21 gauge micropuncture needle. Using standard technique, the initial micro needle was exchanged over a 0.018 micro wire for a transitional 4 [place] micro sheath. The micro sheath was then exchanged over a 0.035 wire for a 6 French vascular sheath. An angled catheter and glidewire were advanced through  the sheath, through the IVC filter, through the infrarenal IVC and the catheter successfully navigated across the chronically occluded left common iliac vein. The glidewire was then exchanged for an exchange length Glidewire and snared from the femoral vein in the midthigh and brought through the popliteal sheath access resulting in successful through and through access from the right internal jugular vein through the left popliteal vein. A catheter was then advanced over the wire into the femoral vein and the glidewire exchanged for a superstiff Amplatz wire. At this time, another cavagram was performed demonstrating successful debulking of the caval thrombus. At this point it was decided to proceed with removal of the IVC filter. Over the superstiff Amplatz wire from the right jugular vein approach, the skin tract was dilated and a second 26 [place] Gore dry seal sheath was advanced and positioned in the suprarenal IVC. A Bard IVC filter retrieval set was then advanced coaxially through this device next to the Amplatz wire. The Bard [place] IVC filter was successfully retrieved using standard technique. Additional thrombectomy was then performed using the angio VAC device. This yielded significant material. Repeat vena cavagram demonstrates complete removal of thrombus from the inferior vena cava. Attention was next turned to the occluded left common iliac artery. The perfusion circuit was reversed and the reperfusion catheter was placed in the right femoral sheath and the angio back device was advanced through the right IJ sheath. Suction thrombectomy was performed in the right common iliac vein. Working from the popliteal access, angioplasty was performed of the common femoral, external iliac and common iliac veins and thrombus was pushed into the AngioVac. There appears to be a significant stenosis at the transition from the common iliac to the external iliac vein. This was resistant to initial angioplasty, so a  14 x 60 mm wall stent was selected, placed across the lesion and deployed. The wall stent was then post dilated to 12 mm using the 12 by 40 Conquest balloon. Catheter venography demonstrates significant near occlusive thrombus throughout the common femoral vein, and femoral vein in the thigh. Therefore, the decision was made to proceed with additional thrombectomy from the popliteal access using the [facility]. The 6 French left popliteal sheath was exchanged over the  wire for a 9 French sheath. The is a long take catheter was advanced over the wire and real lytic thrombectomy performed throughout the femoral, common femoral and iliac veins. Follow-up angiography demonstrates significant improvement with restoration of in-line flow. There is a significant residual stenosis at the common femoral vein. This was angioplastied to 12 mm using the 12 x 40 mm Conquest balloon. On the final angiographic runs, there is successful restoration of in-line flow from the popliteal access through the IVC as well as successful recanalization of both iliac veins. All wires and devices were removed. Once the patient's ACT drop below 200, the sheaths were removed. Hemostasis was attained with a combination of manual pressure and pursestring sutures at both 26 [place] sheath sites. 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 Significant stenosis at the transition from the left common to the left external iliac vein 3. 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 Successful recanalization and angioplasty of the chronically occluded right common iliac vein 2. Successful thrombolysis/thrombectomy of the inferior vena cava, left-sided iliac veins and left lower extremity deep venous system to the level of the popliteal vein. 3. Stent assisted angioplasty of a flow limiting stenosis at the interface of the left common and external iliac veins. 4. Angioplasty of a stenotic segment  of the left common femoral vein. 5. The IVC filter was retrieved.  No additional filter was placed. PLAN: 1. Continue full-dose heparin and transitioned to weight based Lovenox prior to discharge. 2. Progress toward ambulation as soon as clinically possible. 3. Compression garment or Ace wraps to the left lower extremity if needed to facilitate edema reduction. 4. Follow-up with Dr. Laurence Ferrari in interventional radiology clinic in 2 weeks. 5. If patient demonstrates any evidence of bleeding and needs to come off of his anticoagulation in the future, he must have an IVC filter replaced. Signed, Criselda Peaches, MD [facility] Radiology Electronically Signed   By: Jacqulynn Cadet M.D.   On: 01/07/2018 10:46   Dg Chest Port 1 View  Result Date: 01/08/2018 CLINICAL DATA:  Fever EXAM: PORTABLE CHEST 1 VIEW COMPARISON:  01/06/2018, 08/06/2017 chest x-ray FINDINGS: Right-sided central venous port tip overlies the SVC. No consolidation or effusion. Normal heart size. Negative for a pneumothorax. Endotracheal tube has been removed. IMPRESSION: No active disease. Electronically Signed   By: Donavan Foil M.D.   On: 01/08/2018 03:05   Dg Chest Port 1 View  Result Date: 01/07/2018 CLINICAL DATA:  Respiratory failure EXAM: PORTABLE CHEST 1 VIEW COMPARISON:  None. FINDINGS: The heart size and mediastinal contours are within normal limits. Both lungs are clear. Endotracheal tube tip is 4.3 cm above the carina in satisfactory position. Port catheter tip is noted in the distal SVC. No effusion or pneumothorax. The visualized skeletal structures are unremarkable. IMPRESSION: Satisfactory support line and tubes.  Clear lungs. Electronically Signed   By: Ashley Royalty M.D.   On: 01/07/2018 03:30   Ir Pta Venous Except Dialysis Circuit  Result Date: 01/07/2018 INDICATION: 57 year-old male with metastatic colon cancer an acute severe iliocaval and left lower extremity DVT.  He presents for thrombectomy. EXAM: 1. Ultrasound-guided vascular access left popliteal vein 2. Ultrasound-guided access right common femoral vein 3. Ultrasound-guided access right internal jugular vein 4. Second ultrasound-guided access right internal jugular vein 5. Left lower extremity venogram 6. Right lower extremity venogram 7. Inferior vena cavogram 8. Combined pharmacomechanical thrombolysis/thrombectomy using tPA, the AngioVac device,  and the Angiojet device 9. IVC filter retrieval 10. Recanalization and balloon angioplasty of the right common iliac vein 11. Recanalization and angioplasty with subsequent stent placement in the left iliac vein 12. Balloon angioplasty of the left common femoral and femoral veins Operating Physicians: Corrie Mckusick, DO and Jacqulynn Cadet, MD COMPARISON:  CT scan 12/30/2017 MEDICATIONS: 12 mg tPA administered directly into thrombus throughout the course of the procedure ANESTHESIA/SEDATION: General anesthesia provided by the anesthesiology service FLUOROSCOPY TIME:  Fluoroscopy Time: 67 minutes 6 seconds (816.6 mGy). COMPLICATIONS: None immediate. TECHNIQUE: Informed written consent was obtained from the patient after a thorough discussion of the procedural risks, benefits and alternatives. All questions were addressed. Maximal Sterile Barrier Technique was utilized including caps, mask, sterile gowns, sterile gloves, sterile drape, hand hygiene and skin antiseptic. A timeout was performed prior to the initiation of the procedure. The patient was initially positioned prone. The left popliteal vein was interrogated with ultrasound and found to be widely patent. An image was obtained and stored for the medical record. Local anesthesia was attained by infiltration with 1% lidocaine. A small dermatotomy was made. Under real-time sonographic guidance, the vessel was punctured with a 21 gauge micropuncture needle. Using standard technique, the initial micro needle was exchanged over  a 0.018 micro wire for a transitional 4 [place] micro sheath. The micro sheath was then exchanged over a 0.035 wire for a 45 cm 6 [place] braided Arrow sheath. A left lower extremity venogram was performed. The femoral vein is completely occluded in the thigh. The popliteal vein remains patent. 6 mg tPA was laced throughout the thrombus within the common and femoral veins. The patient was repositioned supine and intubated. The right common femoral was interrogated with ultrasound and found to be patent with changes of chronic DVT. An image was obtained and stored for the medical record. Local anesthesia was attained by infiltration with 1% lidocaine. A small dermatotomy was made. Under real-time sonographic guidance, the vessel was punctured with a 21 gauge micropuncture needle. Using standard technique, the initial micro needle was exchanged over a 0.018 micro wire for a transitional 4 [place] micro sheath. The micro sheath was then exchanged over a 0.035 wire for a 6 French vascular sheath. Right lower extremity venography was performed. The common femoral and external iliac veins are patent. 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 All venous drainage is via parallel spinous collaterals. An angled catheter and glidewire were introduced through the sheath. With some manipulation, the catheter was successfully advanced into the suprarenal IVC. An inferior vena cavagram was performed confirming patency of both renal veins and the super renal IVC. A superstiff Amplatz wire was advanced into the right heart. A 10 x 40 mm Conquest balloon was introduced over the wire the occluded segment of the common iliac vein was angioplastied. Repeat venography demonstrates recreation of a small vascular channel. A 10 x 40 mm Conquest balloon was then selected and introduced over the wire. Venography demonstrates successful recanalization of the common iliac vein. There is flow through the IVC. Extensive filling defects within  the vena cava filter consistent with known caval thrombosis. The 6 French sheath was exchanged in the soft tissue tract dilated to 24 [place]. A 26 French Gore dry seal sheath was then advanced over the wire and positioned in the right common iliac vein. The right internal jugular vein was interrogated with ultrasound and found to be widely patent. An image was obtained and stored for the medical record. Local anesthesia was  attained by infiltration with 1% lidocaine. A small dermatotomy was made. Under real-time sonographic guidance, the vessel was punctured with a 21 gauge micropuncture needle. Using standard technique, the initial micro needle was exchanged over a 0.018 micro wire for a transitional 4 [place] micro sheath. The micro sheath was then exchanged over a 0.035 wire for a 6 French vascular sheath. A superstiff Amplatz wire was advanced into the suprarenal IVC. The skin tract was then serially dilated and an 35 French reperfusion catheter was advanced and position with the tip at the cavoatrial junction. At this time, the patient was fully heparinized until the ACT level was greater than 300 and the AngioVac circuit was established. The angio [facility] device was advanced through the 76 French sheath from the right common femoral approach and suction thrombectomy was performed throughout the infrarenal inferior vena cava. Working from the popliteal access, additional left lower extremity venography images were performed focusing on the iliac and common femoral vasculature. Thrombus is present within the common femoral vein and the external iliac vein. The left common iliac vein is completely occluded similar to the other side. Attempts were made to pass through the occluded left common iliac vein from below. This was unsuccessful. Therefore, a second right internal jugular venous access was required. The right internal jugular vein was was again interrogated with ultrasound and found to be widely patent. An  image was obtained and stored for the medical record. Local anesthesia was attained by infiltration with 1% lidocaine. A small dermatotomy was made. Under real-time sonographic guidance, the vessel was punctured with a 21 gauge micropuncture needle. Using standard technique, the initial micro needle was exchanged over a 0.018 micro wire for a transitional 4 [place] micro sheath. The micro sheath was then exchanged over a 0.035 wire for a 6 French vascular sheath. An angled catheter and glidewire were advanced through the sheath, through the IVC filter, through the infrarenal IVC and the catheter successfully navigated across the chronically occluded left common iliac vein. The glidewire was then exchanged for an exchange length Glidewire and snared from the femoral vein in the midthigh and brought through the popliteal sheath access resulting in successful through and through access from the right internal jugular vein through the left popliteal vein. A catheter was then advanced over the wire into the femoral vein and the glidewire exchanged for a superstiff Amplatz wire. At this time, another cavagram was performed demonstrating successful debulking of the caval thrombus. At this point it was decided to proceed with removal of the IVC filter. Over the superstiff Amplatz wire from the right jugular vein approach, the skin tract was dilated and a second 26 [place] Gore dry seal sheath was advanced and positioned in the suprarenal IVC. A Bard IVC filter retrieval set was then advanced coaxially through this device next to the Amplatz wire. The Bard [place] IVC filter was successfully retrieved using standard technique. Additional thrombectomy was then performed using the angio VAC device. This yielded significant material. Repeat vena cavagram demonstrates complete removal of thrombus from the inferior vena cava. Attention was next turned to the occluded left common iliac artery. The perfusion circuit was reversed and  the reperfusion catheter was placed in the right femoral sheath and the angio back device was advanced through the right IJ sheath. Suction thrombectomy was performed in the right common iliac vein. Working from the popliteal access, angioplasty was performed of the common femoral, external iliac and common iliac veins and thrombus was pushed into the  AngioVac. There appears to be a significant stenosis at the transition from the common iliac to the external iliac vein. This was resistant to initial angioplasty, so a 14 x 60 mm wall stent was selected, placed across the lesion and deployed. The wall stent was then post dilated to 12 mm using the 12 by 40 Conquest balloon. Catheter venography demonstrates significant near occlusive thrombus throughout the common femoral vein, and femoral vein in the thigh. Therefore, the decision was made to proceed with additional thrombectomy from the popliteal access using the [facility]. The 6 French left popliteal sheath was exchanged over the wire for a 9 French sheath. The is a long take catheter was advanced over the wire and real lytic thrombectomy performed throughout the femoral, common femoral and iliac veins. Follow-up angiography demonstrates significant improvement with restoration of in-line flow. There is a significant residual stenosis at the common femoral vein. This was angioplastied to 12 mm using the 12 x 40 mm Conquest balloon. On the final angiographic runs, there is successful restoration of in-line flow from the popliteal access through the IVC as well as successful recanalization of both iliac veins. All wires and devices were removed. Once the patient's ACT drop below 200, the sheaths were removed. Hemostasis was attained with a combination of manual pressure and pursestring sutures at both 26 [place] sheath sites. 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 Significant stenosis at the transition from the left  common to the left external iliac vein 3. 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 Successful recanalization and angioplasty of the chronically occluded right common iliac vein 2. Successful thrombolysis/thrombectomy of the inferior vena cava, left-sided iliac veins and left lower extremity deep venous system to the level of the popliteal vein. 3. Stent assisted angioplasty of a flow limiting stenosis at the interface of the left common and external iliac veins. 4. Angioplasty of a stenotic segment of the left common femoral vein. 5. The IVC filter was retrieved.  No additional filter was placed. PLAN: 1. Continue full-dose heparin and transitioned to weight based Lovenox prior to discharge. 2. Progress toward ambulation as soon as clinically possible. 3. Compression garment or Ace wraps to the left lower extremity if needed to facilitate edema reduction. 4. Follow-up with Dr. Laurence Ferrari in interventional radiology clinic in 2 weeks. 5. If patient demonstrates any evidence of bleeding and needs to come off of his anticoagulation in the future, he must have an IVC filter replaced. Signed, Criselda Peaches, MD [facility] Radiology Electronically Signed   By: Jacqulynn Cadet M.D.   On: 01/07/2018 10:46   Ir Pta Venous Addl Except Dialysis Circuit  Result Date: 01/07/2018 INDICATION: 57 year-old male with metastatic colon cancer an acute severe iliocaval and left lower extremity DVT. He presents for thrombectomy. EXAM: 1. Ultrasound-guided vascular access left popliteal vein 2. Ultrasound-guided access right common femoral vein 3. Ultrasound-guided access right internal jugular vein 4. Second ultrasound-guided access right internal jugular vein 5. Left lower extremity venogram 6. Right lower extremity venogram 7. Inferior vena cavogram 8. Combined pharmacomechanical thrombolysis/thrombectomy using tPA, the AngioVac device, and the Angiojet  device 9. IVC filter retrieval 10. Recanalization and balloon angioplasty of the right common iliac vein 11. Recanalization and angioplasty with subsequent stent placement in the left iliac vein 12. Balloon angioplasty of the left common femoral and femoral veins Operating Physicians: Corrie Mckusick, DO and Jacqulynn Cadet, MD COMPARISON:  CT scan 12/30/2017 MEDICATIONS: 12 mg tPA administered directly into thrombus throughout the course of the procedure ANESTHESIA/SEDATION: General anesthesia provided by the anesthesiology service FLUOROSCOPY TIME:  Fluoroscopy Time: 67 minutes 6 seconds (816.6 mGy). COMPLICATIONS: None immediate. TECHNIQUE: Informed written consent was obtained from the patient after a thorough discussion of the procedural risks, benefits and alternatives. All questions were addressed. Maximal Sterile Barrier Technique was utilized including caps, mask, sterile gowns, sterile gloves, sterile drape, hand hygiene and skin antiseptic. A timeout was performed prior to the initiation of the procedure. The patient was initially positioned prone. The left popliteal vein was interrogated with ultrasound and found to be widely patent. An image was obtained and stored for the medical record. Local anesthesia was attained by infiltration with 1% lidocaine. A small dermatotomy was made. Under real-time sonographic guidance, the vessel was punctured with a 21 gauge micropuncture needle. Using standard technique, the initial micro needle was exchanged over a 0.018 micro wire for a transitional 4 [place] micro sheath. The micro sheath was then exchanged over a 0.035 wire for a 45 cm 6 [place] braided Arrow sheath. A left lower extremity venogram was performed. The femoral vein is completely occluded in the thigh. The popliteal vein remains patent. 6 mg tPA was laced throughout the thrombus within the common and femoral veins. The patient was repositioned supine and intubated. The right common femoral was  interrogated with ultrasound and found to be patent with changes of chronic DVT. An image was obtained and stored for the medical record. Local anesthesia was attained by infiltration with 1% lidocaine. A small dermatotomy was made. Under real-time sonographic guidance, the vessel was punctured with a 21 gauge micropuncture needle. Using standard technique, the initial micro needle was exchanged over a 0.018 micro wire for a transitional 4 [place] micro sheath. The micro sheath was then exchanged over a 0.035 wire for a 6 French vascular sheath. Right lower extremity venography was performed. The common femoral and external iliac veins are patent. 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 All venous drainage is via parallel spinous collaterals. An angled catheter and glidewire were introduced through the sheath. With some manipulation, the catheter was successfully advanced into the suprarenal IVC. An inferior vena cavagram was performed confirming patency of both renal veins and the super renal IVC. A superstiff Amplatz wire was advanced into the right heart. A 10 x 40 mm Conquest balloon was introduced over the wire the occluded segment of the common iliac vein was angioplastied. Repeat venography demonstrates recreation of a small vascular channel. A 10 x 40 mm Conquest balloon was then selected and introduced over the wire. Venography demonstrates successful recanalization of the common iliac vein. There is flow through the IVC. 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 The 6 French sheath was exchanged in the soft tissue tract dilated to 24 [place]. A 26 French Gore dry seal sheath was then advanced over the wire and positioned in the right common iliac vein. The right internal jugular vein was interrogated with ultrasound and found to be widely patent. An image was obtained and stored for the medical record. Local anesthesia was attained by infiltration  with 1% lidocaine. A small dermatotomy was made. Under real-time sonographic guidance, the vessel was punctured with a 21 gauge micropuncture needle. Using standard technique, the initial micro needle was exchanged over a 0.018 micro wire for a transitional 4 [place] micro sheath. The micro sheath was then exchanged over a  0.035 wire for a 6 [place] vascular sheath. A superstiff Amplatz wire was advanced into the suprarenal IVC. The skin tract was then serially dilated and an 48 French reperfusion catheter was advanced and position with the tip at the cavoatrial junction. At this time, the patient was fully heparinized until the ACT level was greater than 300 and the AngioVac circuit was established. The angio [facility] device was advanced through the 21 French sheath from the right common femoral approach and suction thrombectomy was performed throughout the infrarenal inferior vena cava. Working from the popliteal access, additional left lower extremity venography images were performed focusing on the iliac and common femoral vasculature. Thrombus is present within the common femoral vein and the external iliac vein. The left common iliac vein is completely occluded similar to the other side. Attempts were made to pass through the occluded left common iliac vein from below. This was unsuccessful. Therefore, a second right internal jugular venous access was required. The right internal jugular vein was was again interrogated with ultrasound and found to be widely patent. An image was obtained and stored for the medical record. Local anesthesia was attained by infiltration with 1% lidocaine. A small dermatotomy was made. Under real-time sonographic guidance, the vessel was punctured with a 21 gauge micropuncture needle. Using standard technique, the initial micro needle was exchanged over a 0.018 micro wire for a transitional 4 [place] micro sheath. The micro sheath was then exchanged over a 0.035 wire for a 6 French  vascular sheath. An angled catheter and glidewire were advanced through the sheath, through the IVC filter, through the infrarenal IVC and the catheter successfully navigated across the chronically occluded left common iliac vein. The glidewire was then exchanged for an exchange length Glidewire and snared from the femoral vein in the midthigh and brought through the popliteal sheath access resulting in successful through and through access from the right internal jugular vein through the left popliteal vein. A catheter was then advanced over the wire into the femoral vein and the glidewire exchanged for a superstiff Amplatz wire. At this time, another cavagram was performed demonstrating successful debulking of the caval thrombus. At this point it was decided to proceed with removal of the IVC filter. Over the superstiff Amplatz wire from the right jugular vein approach, the skin tract was dilated and a second 26 [place] Gore dry seal sheath was advanced and positioned in the suprarenal IVC. A Bard IVC filter retrieval set was then advanced coaxially through this device next to the Amplatz wire. The Bard [place] IVC filter was successfully retrieved using standard technique. Additional thrombectomy was then performed using the angio VAC device. This yielded significant material. Repeat vena cavagram demonstrates complete removal of thrombus from the inferior vena cava. Attention was next turned to the occluded left common iliac artery. The perfusion circuit was reversed and the reperfusion catheter was placed in the right femoral sheath and the angio back device was advanced through the right IJ sheath. Suction thrombectomy was performed in the right common iliac vein. Working from the popliteal access, angioplasty was performed of the common femoral, external iliac and common iliac veins and thrombus was pushed into the AngioVac. There appears to be a significant stenosis at the transition from the common iliac to  the external iliac vein. This was resistant to initial angioplasty, so a 14 x 60 mm wall stent was selected, placed across the lesion and deployed. The wall stent was then post dilated to 12 mm using the  12 by 40 Conquest balloon. Catheter venography demonstrates significant near occlusive thrombus throughout the common femoral vein, and femoral vein in the thigh. Therefore, the decision was made to proceed with additional thrombectomy from the popliteal access using the [facility]. The 6 French left popliteal sheath was exchanged over the wire for a 9 French sheath. The is a long take catheter was advanced over the wire and real lytic thrombectomy performed throughout the femoral, common femoral and iliac veins. Follow-up angiography demonstrates significant improvement with restoration of in-line flow. There is a significant residual stenosis at the common femoral vein. This was angioplastied to 12 mm using the 12 x 40 mm Conquest balloon. On the final angiographic runs, there is successful restoration of in-line flow from the popliteal access through the IVC as well as successful recanalization of both iliac veins. All wires and devices were removed. Once the patient's ACT drop below 200, the sheaths were removed. Hemostasis was attained with a combination of manual pressure and pursestring sutures at both 26 [place] sheath sites. 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 Significant stenosis at the transition from the left common to the left external iliac vein 3. 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 Successful recanalization and angioplasty of the chronically occluded right common iliac vein 2. Successful thrombolysis/thrombectomy of the inferior vena cava, left-sided iliac veins and left lower extremity deep venous system to the level of the popliteal vein. 3. Stent assisted angioplasty of a flow limiting stenosis at the interface of the  left common and external iliac veins. 4. Angioplasty of a stenotic segment of the left common femoral vein. 5. The IVC filter was retrieved.  No additional filter was placed. PLAN: 1. Continue full-dose heparin and transitioned to weight based Lovenox prior to discharge. 2. Progress toward ambulation as soon as clinically possible. 3. Compression garment or Ace wraps to the left lower extremity if needed to facilitate edema reduction. 4. Follow-up with Dr. Laurence Ferrari in interventional radiology clinic in 2 weeks. 5. If patient demonstrates any evidence of bleeding and needs to come off of his anticoagulation in the future, he must have an IVC filter replaced. Signed, Criselda Peaches, MD [facility] Radiology Electronically Signed   By: Jacqulynn Cadet M.D.   On: 01/07/2018 10:46   Ir [facility] [employer] [facility] Stent  Initial Vein  Inc Angioplasty  Result Date: 01/07/2018 INDICATION: 57 year-old male with metastatic colon cancer an acute severe iliocaval and left lower extremity DVT. He presents for thrombectomy. EXAM: 1. Ultrasound-guided vascular access left popliteal vein 2. Ultrasound-guided access right common femoral vein 3. Ultrasound-guided access right internal jugular vein 4. Second ultrasound-guided access right internal jugular vein 5. Left lower extremity venogram 6. Right lower extremity venogram 7. Inferior vena cavogram 8. Combined pharmacomechanical thrombolysis/thrombectomy using tPA, the AngioVac device, and the Angiojet device 9. IVC filter retrieval 10. Recanalization and balloon angioplasty of the right common iliac vein 11. Recanalization and angioplasty with subsequent stent placement in the left iliac vein 12. Balloon angioplasty of the left common femoral and femoral veins Operating Physicians: Corrie Mckusick, DO and Jacqulynn Cadet, MD COMPARISON:  CT scan 12/30/2017 MEDICATIONS: 12 mg tPA administered directly into thrombus throughout the course  of the procedure ANESTHESIA/SEDATION: General anesthesia provided by the anesthesiology service FLUOROSCOPY TIME:  Fluoroscopy Time: 67 minutes 6 seconds (816.6 mGy). COMPLICATIONS: None immediate. TECHNIQUE: Informed written consent was obtained from the patient after a thorough  discussion of the procedural risks, benefits and alternatives. All questions were addressed. Maximal Sterile Barrier Technique was utilized including caps, mask, sterile gowns, sterile gloves, sterile drape, hand hygiene and skin antiseptic. A timeout was performed prior to the initiation of the procedure. The patient was initially positioned prone. The left popliteal vein was interrogated with ultrasound and found to be widely patent. An image was obtained and stored for the medical record. Local anesthesia was attained by infiltration with 1% lidocaine. A small dermatotomy was made. Under real-time sonographic guidance, the vessel was punctured with a 21 gauge micropuncture needle. Using standard technique, the initial micro needle was exchanged over a 0.018 micro wire for a transitional 4 [place] micro sheath. The micro sheath was then exchanged over a 0.035 wire for a 45 cm 6 [place] braided Arrow sheath. A left lower extremity venogram was performed. The femoral vein is completely occluded in the thigh. The popliteal vein remains patent. 6 mg tPA was laced throughout the thrombus within the common and femoral veins. The patient was repositioned supine and intubated. The right common femoral was interrogated with ultrasound and found to be patent with changes of chronic DVT. An image was obtained and stored for the medical record. Local anesthesia was attained by infiltration with 1% lidocaine. A small dermatotomy was made. Under real-time sonographic guidance, the vessel was punctured with a 21 gauge micropuncture needle. Using standard technique, the initial micro needle was exchanged over a 0.018 micro wire for a transitional 4 [place]  micro sheath. The micro sheath was then exchanged over a 0.035 wire for a 6 French vascular sheath. Right lower extremity venography was performed. The common femoral and external iliac veins are patent. 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 All venous drainage is via parallel spinous collaterals. An angled catheter and glidewire were introduced through the sheath. With some manipulation, the catheter was successfully advanced into the suprarenal IVC. An inferior vena cavagram was performed confirming patency of both renal veins and the super renal IVC. A superstiff Amplatz wire was advanced into the right heart. A 10 x 40 mm Conquest balloon was introduced over the wire the occluded segment of the common iliac vein was angioplastied. Repeat venography demonstrates recreation of a small vascular channel. A 10 x 40 mm Conquest balloon was then selected and introduced over the wire. Venography demonstrates successful recanalization of the common iliac vein. There is flow through the IVC. 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 The 6 French sheath was exchanged in the soft tissue tract dilated to 24 [place]. A 26 French Gore dry seal sheath was then advanced over the wire and positioned in the right common iliac vein. The right internal jugular vein was interrogated with ultrasound and found to be widely patent. An image was obtained and stored for the medical record. Local anesthesia was attained by infiltration with 1% lidocaine. A small dermatotomy was made. Under real-time sonographic guidance, the vessel was punctured with a 21 gauge micropuncture needle. Using standard technique, the initial micro needle was exchanged over a 0.018 micro wire for a transitional 4 [place] micro sheath. The micro sheath was then exchanged over a 0.035 wire for a 6 French vascular sheath. A superstiff Amplatz wire was advanced into the suprarenal IVC. The skin tract was  then serially dilated and an 47 French reperfusion catheter was advanced and position with the tip at the cavoatrial junction. At this time, the patient was fully heparinized until  the ACT level was greater than 300 and the AngioVac circuit was established. The angio [facility] device was advanced through the 81 French sheath from the right common femoral approach and suction thrombectomy was performed throughout the infrarenal inferior vena cava. Working from the popliteal access, additional left lower extremity venography images were performed focusing on the iliac and common femoral vasculature. Thrombus is present within the common femoral vein and the external iliac vein. The left common iliac vein is completely occluded similar to the other side. Attempts were made to pass through the occluded left common iliac vein from below. This was unsuccessful. Therefore, a second right internal jugular venous access was required. The right internal jugular vein was was again interrogated with ultrasound and found to be widely patent. An image was obtained and stored for the medical record. Local anesthesia was attained by infiltration with 1% lidocaine. A small dermatotomy was made. Under real-time sonographic guidance, the vessel was punctured with a 21 gauge micropuncture needle. Using standard technique, the initial micro needle was exchanged over a 0.018 micro wire for a transitional 4 [place] micro sheath. The micro sheath was then exchanged over a 0.035 wire for a 6 French vascular sheath. An angled catheter and glidewire were advanced through the sheath, through the IVC filter, through the infrarenal IVC and the catheter successfully navigated across the chronically occluded left common iliac vein. The glidewire was then exchanged for an exchange length Glidewire and snared from the femoral vein in the midthigh and brought through the popliteal sheath access resulting in successful through and through access from the  right internal jugular vein through the left popliteal vein. A catheter was then advanced over the wire into the femoral vein and the glidewire exchanged for a superstiff Amplatz wire. At this time, another cavagram was performed demonstrating successful debulking of the caval thrombus. At this point it was decided to proceed with removal of the IVC filter. Over the superstiff Amplatz wire from the right jugular vein approach, the skin tract was dilated and a second 26 [place] Gore dry seal sheath was advanced and positioned in the suprarenal IVC. A Bard IVC filter retrieval set was then advanced coaxially through this device next to the Amplatz wire. The Bard [place] IVC filter was successfully retrieved using standard technique. Additional thrombectomy was then performed using the angio VAC device. This yielded significant material. Repeat vena cavagram demonstrates complete removal of thrombus from the inferior vena cava. Attention was next turned to the occluded left common iliac artery. The perfusion circuit was reversed and the reperfusion catheter was placed in the right femoral sheath and the angio back device was advanced through the right IJ sheath. Suction thrombectomy was performed in the right common iliac vein. Working from the popliteal access, angioplasty was performed of the common femoral, external iliac and common iliac veins and thrombus was pushed into the AngioVac. There appears to be a significant stenosis at the transition from the common iliac to the external iliac vein. This was resistant to initial angioplasty, so a 14 x 60 mm wall stent was selected, placed across the lesion and deployed. The wall stent was then post dilated to 12 mm using the 12 by 40 Conquest balloon. Catheter venography demonstrates significant near occlusive thrombus throughout the common femoral vein, and femoral vein in the thigh. Therefore, the decision was made to proceed with additional thrombectomy from the  popliteal access using the [facility]. The 6 French left popliteal sheath was exchanged  over the wire for a 9 French sheath. The is a long take catheter was advanced over the wire and real lytic thrombectomy performed throughout the femoral, common femoral and iliac veins. Follow-up angiography demonstrates significant improvement with restoration of in-line flow. There is a significant residual stenosis at the common femoral vein. This was angioplastied to 12 mm using the 12 x 40 mm Conquest balloon. On the final angiographic runs, there is successful restoration of in-line flow from the popliteal access through the IVC as well as successful recanalization of both iliac veins. All wires and devices were removed. Once the patient's ACT drop below 200, the sheaths were removed. Hemostasis was attained with a combination of manual pressure and pursestring sutures at both 26 [place] sheath sites. 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 Significant stenosis at the transition from the left common to the left external iliac vein 3. 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 Successful recanalization and angioplasty of the chronically occluded right common iliac vein 2. Successful thrombolysis/thrombectomy of the inferior vena cava, left-sided iliac veins and left lower extremity deep venous system to the level of the popliteal vein. 3. Stent assisted angioplasty of a flow limiting stenosis at the interface of the left common and external iliac veins. 4. Angioplasty of a stenotic segment of the left common femoral vein. 5. The IVC filter was retrieved.  No additional filter was placed. PLAN: 1. Continue full-dose heparin and transitioned to weight based Lovenox prior to discharge. 2. Progress toward ambulation as soon as clinically possible. 3. Compression garment or Ace wraps to the left lower extremity if needed to facilitate edema reduction. 4. Follow-up  with Dr. Laurence Ferrari in interventional radiology clinic in 2 weeks. 5. If patient demonstrates any evidence of bleeding and needs to come off of his anticoagulation in the future, he must have an IVC filter replaced. Signed, Criselda Peaches, MD [facility] Radiology Electronically Signed   By: Jacqulynn Cadet M.D.   On: 01/07/2018 10:46   Ir Ivc Filter Retrieval / S&i /img Guid/mod Sed  Result Date: 01/07/2018 INDICATION: 57 year-old male with metastatic colon cancer an acute severe iliocaval and left lower extremity DVT. He presents for thrombectomy. EXAM: 1. Ultrasound-guided vascular access left popliteal vein 2. Ultrasound-guided access right common femoral vein 3. Ultrasound-guided access right internal jugular vein 4. Second ultrasound-guided access right internal jugular vein 5. Left lower extremity venogram 6. Right lower extremity venogram 7. Inferior vena cavogram 8. Combined pharmacomechanical thrombolysis/thrombectomy using tPA, the AngioVac device, and the Angiojet device 9. IVC filter retrieval 10. Recanalization and balloon angioplasty of the right common iliac vein 11. Recanalization and angioplasty with subsequent stent placement in the left iliac vein 12. Balloon angioplasty of the left common femoral and femoral veins Operating Physicians: Corrie Mckusick, DO and Jacqulynn Cadet, MD COMPARISON:  CT scan 12/30/2017 MEDICATIONS: 12 mg tPA administered directly into thrombus throughout the course of the procedure ANESTHESIA/SEDATION: General anesthesia provided by the anesthesiology service FLUOROSCOPY TIME:  Fluoroscopy Time: 67 minutes 6 seconds (816.6 mGy). COMPLICATIONS: None immediate. TECHNIQUE: Informed written consent was obtained from the patient after a thorough discussion of the procedural risks, benefits and alternatives. All questions were addressed. Maximal Sterile Barrier Technique was utilized including caps, mask, sterile gowns,  sterile gloves, sterile drape, hand hygiene and skin antiseptic. A timeout was performed prior to the initiation of the procedure. The patient was initially positioned prone. The left popliteal  vein was interrogated with ultrasound and found to be widely patent. An image was obtained and stored for the medical record. Local anesthesia was attained by infiltration with 1% lidocaine. A small dermatotomy was made. Under real-time sonographic guidance, the vessel was punctured with a 21 gauge micropuncture needle. Using standard technique, the initial micro needle was exchanged over a 0.018 micro wire for a transitional 4 [place] micro sheath. The micro sheath was then exchanged over a 0.035 wire for a 45 cm 6 [place] braided Arrow sheath. A left lower extremity venogram was performed. The femoral vein is completely occluded in the thigh. The popliteal vein remains patent. 6 mg tPA was laced throughout the thrombus within the common and femoral veins. The patient was repositioned supine and intubated. The right common femoral was interrogated with ultrasound and found to be patent with changes of chronic DVT. An image was obtained and stored for the medical record. Local anesthesia was attained by infiltration with 1% lidocaine. A small dermatotomy was made. Under real-time sonographic guidance, the vessel was punctured with a 21 gauge micropuncture needle. Using standard technique, the initial micro needle was exchanged over a 0.018 micro wire for a transitional 4 [place] micro sheath. The micro sheath was then exchanged over a 0.035 wire for a 6 French vascular sheath. Right lower extremity venography was performed. The common femoral and external iliac veins are patent. 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 All venous drainage is via parallel spinous collaterals. An angled catheter and glidewire were introduced through the sheath. With some manipulation, the catheter was successfully advanced into the  suprarenal IVC. An inferior vena cavagram was performed confirming patency of both renal veins and the super renal IVC. A superstiff Amplatz wire was advanced into the right heart. A 10 x 40 mm Conquest balloon was introduced over the wire the occluded segment of the common iliac vein was angioplastied. Repeat venography demonstrates recreation of a small vascular channel. A 10 x 40 mm Conquest balloon was then selected and introduced over the wire. Venography demonstrates successful recanalization of the common iliac vein. There is flow through the IVC. 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 The 6 French sheath was exchanged in the soft tissue tract dilated to 24 [place]. A 26 French Gore dry seal sheath was then advanced over the wire and positioned in the right common iliac vein. The right internal jugular vein was interrogated with ultrasound and found to be widely patent. An image was obtained and stored for the medical record. Local anesthesia was attained by infiltration with 1% lidocaine. A small dermatotomy was made. Under real-time sonographic guidance, the vessel was punctured with a 21 gauge micropuncture needle. Using standard technique, the initial micro needle was exchanged over a 0.018 micro wire for a transitional 4 [place] micro sheath. The micro sheath was then exchanged over a 0.035 wire for a 6 French vascular sheath. A superstiff Amplatz wire was advanced into the suprarenal IVC. The skin tract was then serially dilated and an 60 French reperfusion catheter was advanced and position with the tip at the cavoatrial junction. At this time, the patient was fully heparinized until the ACT level was greater than 300 and the AngioVac circuit was established. The angio [facility] device was advanced through the 66 French sheath from the right common femoral approach and suction thrombectomy was performed throughout the infrarenal inferior vena cava. Working  from the popliteal access, additional left lower extremity venography images  were performed focusing on the iliac and common femoral vasculature. Thrombus is present within the common femoral vein and the external iliac vein. The left common iliac vein is completely occluded similar to the other side. Attempts were made to pass through the occluded left common iliac vein from below. This was unsuccessful. Therefore, a second right internal jugular venous access was required. The right internal jugular vein was was again interrogated with ultrasound and found to be widely patent. An image was obtained and stored for the medical record. Local anesthesia was attained by infiltration with 1% lidocaine. A small dermatotomy was made. Under real-time sonographic guidance, the vessel was punctured with a 21 gauge micropuncture needle. Using standard technique, the initial micro needle was exchanged over a 0.018 micro wire for a transitional 4 [place] micro sheath. The micro sheath was then exchanged over a 0.035 wire for a 6 French vascular sheath. An angled catheter and glidewire were advanced through the sheath, through the IVC filter, through the infrarenal IVC and the catheter successfully navigated across the chronically occluded left common iliac vein. The glidewire was then exchanged for an exchange length Glidewire and snared from the femoral vein in the midthigh and brought through the popliteal sheath access resulting in successful through and through access from the right internal jugular vein through the left popliteal vein. A catheter was then advanced over the wire into the femoral vein and the glidewire exchanged for a superstiff Amplatz wire. At this time, another cavagram was performed demonstrating successful debulking of the caval thrombus. At this point it was decided to proceed with removal of the IVC filter. Over the superstiff Amplatz wire from the right jugular vein approach, the skin tract was  dilated and a second 26 [place] Gore dry seal sheath was advanced and positioned in the suprarenal IVC. A Bard IVC filter retrieval set was then advanced coaxially through this device next to the Amplatz wire. The Bard [place] IVC filter was successfully retrieved using standard technique. Additional thrombectomy was then performed using the angio VAC device. This yielded significant material. Repeat vena cavagram demonstrates complete removal of thrombus from the inferior vena cava. Attention was next turned to the occluded left common iliac artery. The perfusion circuit was reversed and the reperfusion catheter was placed in the right femoral sheath and the angio back device was advanced through the right IJ sheath. Suction thrombectomy was performed in the right common iliac vein. Working from the popliteal access, angioplasty was performed of the common femoral, external iliac and common iliac veins and thrombus was pushed into the AngioVac. There appears to be a significant stenosis at the transition from the common iliac to the external iliac vein. This was resistant to initial angioplasty, so a 14 x 60 mm wall stent was selected, placed across the lesion and deployed. The wall stent was then post dilated to 12 mm using the 12 by 40 Conquest balloon. Catheter venography demonstrates significant near occlusive thrombus throughout the common femoral vein, and femoral vein in the thigh. Therefore, the decision was made to proceed with additional thrombectomy from the popliteal access using the [facility]. The 6 French left popliteal sheath was exchanged over the wire for a 9 French sheath. The is a long take catheter was advanced over the wire and real lytic thrombectomy performed throughout the femoral, common femoral and iliac veins. Follow-up angiography demonstrates significant improvement with restoration of in-line flow. There is a significant residual stenosis at the common femoral  vein. This was angioplastied to 12 mm using the 12 x 40 mm Conquest balloon. On the final angiographic runs, there is successful restoration of in-line flow from the popliteal access through the IVC as well as successful recanalization of both iliac veins. All wires and devices were removed. Once the patient's ACT drop below 200, the sheaths were removed. Hemostasis was attained with a combination of manual pressure and pursestring sutures at both 26 [place] sheath sites. 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 Significant stenosis at the transition from the left common to the left external iliac vein 3. 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 Successful recanalization and angioplasty of the chronically occluded right common iliac vein 2. Successful thrombolysis/thrombectomy of the inferior vena cava, left-sided iliac veins and left lower extremity deep venous system to the level of the popliteal vein. 3. Stent assisted angioplasty of a flow limiting stenosis at the interface of the left common and external iliac veins. 4. Angioplasty of a stenotic segment of the left common femoral vein. 5. The IVC filter was retrieved.  No additional filter was placed. PLAN: 1. Continue full-dose heparin and transitioned to weight based Lovenox prior to discharge. 2. Progress toward ambulation as soon as clinically possible. 3. Compression garment or Ace wraps to the left lower extremity if needed to facilitate edema reduction. 4. Follow-up with Dr. Laurence Ferrari in interventional radiology clinic in 2 weeks. 5. If patient demonstrates any evidence of bleeding and needs to come off of his anticoagulation in the future, he must have an IVC filter replaced. Signed, Criselda Peaches, MD [facility] Radiology Electronically Signed   By: Jacqulynn Cadet M.D.   On: 01/07/2018 10:46    Microbiology: Recent Results (from the past 240  hour(s))  Surgical pcr screen     Status: Abnormal   Collection Time: 01/05/18  6:15 PM  Result Value Ref Range Status   MRSA, PCR NEGATIVE NEGATIVE Final   Staphylococcus aureus POSITIVE (A) NEGATIVE Final    Comment: (NOTE) The Xpert SA Assay (FDA approved for NASAL specimens in patients 48 years of age and older), is one component of a comprehensive surveillance program. It is not intended to diagnose infection nor to guide or monitor treatment.   Culture, blood (routine x 2)     Status: Abnormal (Preliminary result)   Collection Time: 01/08/18  2:45 AM  Result Value Ref Range Status   Specimen Description BLOOD RIGHT ARM  Final   Special Requests IN PEDIATRIC BOTTLE Blood Culture adequate volume  Final   Culture  Setup Time   Final    GRAM POSITIVE COCCI IN CLUSTERS IN PEDIATRIC BOTTLE CRITICAL VALUE NOTED.  VALUE IS CONSISTENT WITH PREVIOUSLY REPORTED AND CALLED VALUE.    Culture (A)  Final    STAPHYLOCOCCUS SPECIES (COAGULASE NEGATIVE) SUSCEPTIBILITIES TO FOLLOW    Report Status PENDING  Incomplete  Culture, blood (routine x 2)     Status: Abnormal (Preliminary result)   Collection Time: 01/08/18  2:56 AM  Result Value Ref Range Status   Specimen Description BLOOD RIGHT ARM  Final   Special Requests IN PEDIATRIC BOTTLE Blood Culture adequate volume  Final   Culture  Setup Time   Final    GRAM POSITIVE COCCI IN CLUSTERS IN PEDIATRIC BOTTLE CRITICAL RESULT CALLED TO, READ BACK BY AND VERIFIED WITH: A. MASTERS,PHARMD [id number] 01/09/2018 T. TYSOR    Culture STAPHYLOCOCCUS SPECIES (COAGULASE NEGATIVE) (A)  Final  Report Status PENDING  Incomplete  Blood Culture ID Panel (Reflexed)     Status: Abnormal   Collection Time: 01/08/18  2:56 AM  Result Value Ref Range Status   Enterococcus species NOT DETECTED NOT DETECTED Final   Listeria monocytogenes NOT DETECTED NOT DETECTED Final   Staphylococcus species DETECTED (A) NOT DETECTED Final    Comment: Methicillin (oxacillin)  resistant coagulase negative staphylococcus. Possible blood culture contaminant (unless isolated from more than one blood culture draw or clinical case suggests pathogenicity). No antibiotic treatment is indicated for blood  culture contaminants. CRITICAL RESULT CALLED TO, READ BACK BY AND VERIFIED WITH: A. MASTERS,PHARMD [id number] 01/09/2018 T. TYSOR    Staphylococcus aureus NOT DETECTED NOT DETECTED Final   Methicillin resistance DETECTED (A) NOT DETECTED Final    Comment: CRITICAL RESULT CALLED TO, READ BACK BY AND VERIFIED WITH: A. MASTERS,PHARMD [id number] 01/09/2018 T. TYSOR    Streptococcus species NOT DETECTED NOT DETECTED Final   Streptococcus agalactiae NOT DETECTED NOT DETECTED Final   Streptococcus pneumoniae NOT DETECTED NOT DETECTED Final   Streptococcus pyogenes NOT DETECTED NOT DETECTED Final   Acinetobacter baumannii NOT DETECTED NOT DETECTED Final   Enterobacteriaceae species NOT DETECTED NOT DETECTED Final   Enterobacter cloacae complex NOT DETECTED NOT DETECTED Final   Escherichia coli NOT DETECTED NOT DETECTED Final   Klebsiella oxytoca NOT DETECTED NOT DETECTED Final   Klebsiella pneumoniae NOT DETECTED NOT DETECTED Final   Proteus species NOT DETECTED NOT DETECTED Final   Serratia marcescens NOT DETECTED NOT DETECTED Final   Haemophilus influenzae NOT DETECTED NOT DETECTED Final   Neisseria meningitidis NOT DETECTED NOT DETECTED Final   Pseudomonas aeruginosa NOT DETECTED NOT DETECTED Final   Candida albicans NOT DETECTED NOT DETECTED Final   Candida glabrata NOT DETECTED NOT DETECTED Final   Candida krusei NOT DETECTED NOT DETECTED Final   Candida parapsilosis NOT DETECTED NOT DETECTED Final   Candida tropicalis NOT DETECTED NOT DETECTED Final     Labs: Basic Metabolic Panel: Recent Labs  Lab 01/06/18 1503 01/06/18 2311 01/07/18 0351 01/08/18 0249 01/09/18 0624 01/10/18 0653 01/11/18 0523  NA 138 134* 136 138  --   --  138  K 3.8 4.4 4.5 3.7  --   --  4.4  CL   --  102 104 106  --   --  105  CO2  --  23 24 27   --   --  25  GLUCOSE 111* 151* 136* 114*  --   --  99  BUN  --  9 11 11   --   --  8  CREATININE  --  0.73 0.80 0.75  --   --  0.63  CALCIUM  --  7.5* 7.7* 7.3*  --   --  8.6*  MG  --  0.9*  --  1.0* 0.8* 1.1* 1.3*  PHOS  --  3.7  --  2.2*  --   --  3.8   Liver Function Tests: Recent Labs  Lab 01/11/18 0523  AST 24  ALT 9*  ALKPHOS 56  BILITOT 0.5  PROT 6.2*  ALBUMIN 2.2*   No results for input(s): LIPASE, AMYLASE in the last 168 hours. No results for input(s): AMMONIA in the last 168 hours. CBC: Recent Labs  Lab 01/06/18 2311  01/08/18 0249 01/08/18 1224 01/08/18 1457 01/09/18 0624 01/10/18 0653 01/11/18 0523  WBC 5.0   < > 6.0 6.3  --  5.4 5.3 5.3  NEUTROABS 3.7  --  3.5  --   --   --   --   --  HGB 7.9*   < > 5.9* 8.4* 8.1* 8.8* 8.7* 8.7*  HCT 23.9*   < > 18.5* 25.8* 25.1* 27.5* 27.5* 28.0*  MCV 82.7   < > 84.5 84.0  --  83.8 85.1 87.0  PLT 115*   < > 102* 96*  --  101* 117* 135*   < > = values in this interval not displayed.   Cardiac Enzymes: No results for input(s): CKTOTAL, CKMB, CKMBINDEX, TROPONINI in the last 168 hours. BNP: BNP (last 3 results) No results for input(s): BNP in the last 8760 hours.  ProBNP (last 3 results) No results for input(s): PROBNP in the last 8760 hours.  CBG: Recent Labs  Lab 01/06/18 2205  GLUCAP 138*       Signed:  Kayleen Memos, MD [facility] 01/11/2018, 7:44 AM

## 2018-01-11 NOTE — Plan of Care (Signed)
All problems were completed at d/c

## 2018-01-11 NOTE — Progress Notes (Signed)
Oncology brief note  I spoke with pt yesterday, he is going home today. Pt declined lovenox, will go home with Xarelto (I think 20mg  daily will be appropriate due to his history of GI bleeding), I will refill his oxycodone and see him back within one week.  [name]  01/11/2018

## 2018-01-12 ENCOUNTER — Other Ambulatory Visit (HOSPITAL_COMMUNITY): Payer: Self-pay | Admitting: Interventional Radiology

## 2018-01-12 ENCOUNTER — Telehealth: Payer: Self-pay | Admitting: Hematology

## 2018-01-12 DIAGNOSIS — I824Z9 Acute embolism and thrombosis of unspecified deep veins of unspecified distal lower extremity: Secondary | ICD-10-CM

## 2018-01-12 NOTE — Telephone Encounter (Signed)
Scheduled appt per 1/15 sch msg - spoke with patient regarding appts.

## 2018-01-14 ENCOUNTER — Telehealth: Payer: Self-pay | Admitting: Family Medicine

## 2018-01-14 NOTE — Telephone Encounter (Signed)
[facility] called requesting verbal orders to continue skilled nursing visits. 1 week 1 and 2 week 2. Please f/u

## 2018-01-18 NOTE — Telephone Encounter (Signed)
[facility] was called and given verbal orders.

## 2018-01-19 ENCOUNTER — Other Ambulatory Visit: Payer: Self-pay | Admitting: Hematology

## 2018-01-19 DIAGNOSIS — C189 Malignant neoplasm of colon, unspecified: Secondary | ICD-10-CM

## 2018-01-19 NOTE — Progress Notes (Signed)
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 region. The tube is been removed. 4. Continued thickening of the colon as above consistent with the patient's known malignancy. 5. Atherosclerotic changes in the aorta. Aortic Atherosclerosis (ICD10-I70.0).   CT AP W Contrast 07/29/17 IMPRESSION: 1. Bowel obstruction at the level of the proximal descending colon at the location of a percutaneous drainage catheter. This could be secondary to wall thickening by the adjacent inflammatory process. Wall thickening due to colon neoplasm also remains a possibility. 2. Dilated, fluid-filled appendix, similar to the dilated loops of colon and small bowel, compatible with dilatation due to the bowel obstruction. There are no findings to indicate appendicitis. 3. Small amount of free peritoneal fluid. 4. The percutaneously drained fluid collection in the left mid abdomen has with resolved. 5. Mild decrease in size of the multiloculated fluid collection in the splenic hilum and distal tail of the pancreas. 6. Stable 8 mm diffusely enhancing mass  in the dome of the liver on the right. This is most likely benign. A solitary enhancing metastasis is less likely. 7. Moderate prostatic hypertrophy.  CT CP W Contrast 07/10/17 IMPRESSION: The salient finding is a new abscess measuring 3.8 x 4.6 cm inferior to the pancreatic tail and spleen.  CT CAP w contrast 07/05/2017  IMPRESSION: 1. Today's study demonstrates some positive response to therapy with decreased size of mass associated with the descending colon, decreased size of the invasive mass extending from the tail of the pancreas into the splenic hilum and decreased size of adjacent peritoneal lesions. There has also been regression of upper abdominal lymphadenopathy. 2. No new pulmonary or hepatic lesions identified to suggest progressive metastatic disease. 3. Aortic atherosclerosis. 4. Additional incidental findings, as above.  CT CAP w Contrast 04/09/2017 IMPRESSION: 1. Response to therapy with decreased peritoneal tumor volume throughout the abdomen. 2. No well-defined residual colonic mass. No bowel obstruction or other acute complication. 3. Decrease in gastrohepatic ligament adenopathy. 4. No new sites of disease. 5.  No acute process or evidence of metastatic disease in the chest. 6.  Aortic atherosclerosis. 7. Mild gynecomastia.  Colonoscopy 01/12/2017 Impression: - Malignant near obstructing tumor in the descending colon and at the splenic flexure. Biopsied. Tattooed. - One 25 mm polyp at the recto-sigmoid colon, removed with a hot snare. Resected and retrieved. Clips (MR unsafe) were placed.  Lower Extremity Ultrasound 02/16/17 Bilateral lower extremity venous duplex complete. There is evidence of deep vein thrombosis involving the femoral, popliteal, and peroneal veins of the right lower extremity.  There is no evidence of superficial vein thrombosis involving the right lower extremity. There is evidence of superficial vein thrombosis involving the lesser  saphenous vein of the left lower extremity. There is no evidence of deep vein thrombosis involving the left lower extremity. There is no evidence of a Baker's cyst bilaterally.  ASSESSMENT & PLAN:  57 y.o.  African-American male, without a significant past medical history, no routine medical care, presented with intermittent rectal bleeding, anemia  and worsening abdominal pain for 3 years.   1. 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  -His case was previously reviewed in our GI tumor Board, unfortunately he has peritoneum metastasis, with a large 11 cm peritoneal metastasis in the  left upper quadrant, which is consistent with peritoneal metastasis. -We previously reviewed the natural history of metastatic colon cancer. Unfortunately his cancer is incurable at this stage. We discussed the goal of therapy is palliative, to prolong his life and improve his quality of life. -He has started first line chemo FOLFOX. Due to history of pancytopenia, I'll skip the 5-FU bolus. -I previously discussed the [facility] genomic test result, which showed wild type KRAS, NRAS, and BRAF, MSI-stable. Based on this and his left-sided colon cancer, he will significantly benefit from EGFR inhibitor. He has started on panitumumab and has been tolerating well. -I previously reviewed the CT scan from 04/09/17 with the patient in detail. He has had excellent partial response to the chemotherapy treatment, no other new lesions, we'll continue current chemotherapy regimen. -I reviewed his restaging CT scan from 07/05/2017, which showed a continuous response, no new lesions. We'll continue current treatment. -He previously developed a moderately thrombocytopenia, chemotherapy dose was reduced  --Due to Pancreatic Abscess,  His recent hospitalization,  chemotherapy has been held. - he has  Being recovering well from his recent hospitalization, still in rehabilitation, but ambulates  Without any difficulty, he is also eating better. - due to his recent thrombosis and GI bleeding, he is not a candidate for Avastin for now. Given questionable disease progression, I recommend him to continue panitumumab for now - due to his recent bowel obstruction, I think he probably has had disease progression, although scan showed stable disease . I recommend to switch his chemotherapy to irinotecan alone, or FOLFIRI if he is able to tolerate.  - the goal of therapy is palliative. - we again discussed the incurable nature of his cancer, an overall poor prognosis. The goal of therapy is palliative. -Started irinotecan and panitumumab on 09/21/17. Tolerated well with increased ostomy output. Added 5-fu back with cycle 2 on 10/04/17 and reduced FOLFIRI due to overall limited PS. Increased to full dose FOLFIRI on 10/18/17, he tolerated well overall with mild to moderate diarrhea. -encouraged him to manage his diarrhea with imodium and lomotil  -Due to his poor toleration of full dose FOLFIRI with diarrhea and emesis, I will reduce irinotecan back to 137m/m2. He has just recovered last cycle chemotherapy, [place] I will reduce his chemo dose further on 11/15/17 for cyle 5 -I suggest he increase his Lomotil to up to 6-8 tablets daily if he experiences significant diarrhea again.   -We previously discussed his CT CAP on 11/25/17 that indicates response to therapy of intraperitoneal metastasis including within the splenic hilum; no evidence of metastatic disease in the chest -Pt did not tolerated the cycle 5 FOLFIRI well, despite dose reduction on both agents.  He is very reluctant to continue same regiment. Given his restaging CT scan showed mixed response, but overall stable disease,  I will hold Irinotecan, and continue  5-FU/leucovorin and Panitumumab as maintenance therapy. He agreed and started on 11/29/17 -he has tolerated maintenance therapy well, without significant diarrhea.  -Due to his recent episode of extensive left lower extremity DVT, his treatment was held.  He now has recovered well, lab reviewed, adequate post treatment, will resume his treatment today  -He had a CT abdomen and pelvis without contrast when he was in the hospital on January 08, 2018, which showed overall stable peritoneal metastasis. -f/u in 2 weeks     2. History of right LE DVT, and extensive LLE DVT  -I previously reviewed his  ultrasound results from 02/17/17, which showed DVT involving the femoral, popliteal, and peritoneal vein of the right lower extremity from 2/22. -He had history of right lower extremity DVT after surgery before  - his Xarelto has been held due to GI bleeding he has a IVC placed -No recurrence of bleeding since IVC placement.  -Pt was admitted to the hospital on 12/30/17 and discharge on 01/11/18. He presented with progressively worsening pain and swelling in the left leg. Iimaging studies notable for worsening left LE DVT. Initially hesitant to start anticoagulation due to known history of GI bleed while on Xarelto. Started on Eliquis and transferred to [facility] for thrombectomy. His left leg edema resolved after thrombectomy, he was discharged with Xarelto (he refused lovenox) 23m bid -He noticed recurrent GI bleeding since 2 days ago (01/25/2018). Due to his bleeding, he has stopped taking Xarelto. I advised him to stay off Xarelto for now, but resume when when bleeding stops, and reduce the dose to 15 mg daily.   3. 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 -- continue oxycodone as needed, pain controlled  -Has also developed some back pain in the past few weeks, related to chemo.  Using oxycodone for the pain. -I called in Neurontin 1036mfor his  shooting leg pain (11/15/17). He can start low dose with 1 tab at night and increase up to 3 tablets in the evening if he can tolerate it.  -Refilled Oxycodone on 12/13/17 -pain controlled -I advised him to take his Neurontin at night, he can take 200 mg.  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 -He has received IV Feraheme twice, but his anemia has not improved. He likely has component of anemia of chronic disease secondary to his underlying malignancy. -Ferritin 196 on 10/04/17, WNL, Hgb 9.8; will continue to monitor  -Persistent anemia, Hg improved to 11.2 (12/13/17). Continue ferrous Sulfate, no need for blood transfusion today.   5. Weight loss, malnutrition, anorexia -Secondary to chemotherapy and cancer -The patient will continue to drink dietary supplements. -He has gained some weight lately - I again strongly encouraged him to try to eat more to avoid losing excess weight. I again recommended he drink 2 ensure or boost supplements a day to help. -I previously prescribed mirtazapine on 05/25/17 in attempts to help his appetite. He knows to take it once every evening. - continue follow-up with dietitian, previous f/u 10/22 -His weight is stable overall lately   6. History of pancreatic Abscess -Presents to hospital on 07/09/17 for abdominal pain. Based on SP CT from 07/10/17 he found to have an abscess. Draining tube placed and he received prolonged course of antibiotics  - His percutaneous drainage tube has been removed  -this has resolved   7. Bowel obstruction due to his colon mass at the splenic flexure and surrounding inflammation due to recent abscess, s/p diverting colostomy  -He was hospitalized on 07/29/17 for SBO  -He had sigmoidoscopy and loop Colostomy and has a J tube placed.  - he is tolerating diet well now. -Bowel movements overall have been normal, but sometimes he  has softer stools and are more loose.  -I again reviewed the usage of both imodium and colace and in what context he should be using them both for his stool consistencies.  -He has colostomy prolapse, he knows to avoid  lifting and carry heavy things -He will see his surgeon early 10/2017  -Diarrhea controlled  -He has loose to watery stools which improves a few days after chemo. I prescribed lomotil on 11/01/17. He should take imodium first and if not enough he should take lomotil to control his diarrhea.    8. Goal of care discussion  -We again discussed the incurable nature of his cancer, and the overall poor prognosis, especially if he does not have good response to chemotherapy or progress on chemo -The patient understands the goal of care is palliative. -I have previously recommended DNR/DNI, he will think about it.  9. Hypokalemia  -10/18/17 Cmet indicates hypokalemia 3.3, decreased from 3.4 at last visit.  -I previously prescribed oral K 20 mEq 1 tablet daily. -potassium previously increased to 3.6 on (11/15/17). Will continue potassium supplement for now.  -K 3.2 today, will increase KCL to 110mq bid  -K is at 4.2 today (01/24/18)  10 Skin rash  -Moderate pruritic acne type skin rash with boils to mid abdomen, likely secondary to vectibix, although the location is unusual with no rash elsewhere on the body - He used cleocin in the past; I refilled prescription on 11/29/17. He will use clindamycin with benzoyl peroxide gel BID. If this does not improve, he will need oral antibiotics.  -Much improved   Plan: -Lab, flush, chemo 5-fu pump and panitumumab today and every 2 weeks, -Refilled Potassium and Oxycodone   -He will hold Xarelto for now due to GI bleeding, and restart xarelto when bleeding stops, decrease to 138monce daily -increase Neurontin to 200 mg at night and continue 10075mn am and pm -f/u in 2 weeks   All questions were answered. The patient knows to call the  clinic with any problems, questions or concerns.  I spent 20 minutes counseling the patient face to face. The total time spent in the appointment was 25 minutes and more than 50% was on counseling.    YanTruitt MerleD 01/24/2018    This document serves as a record of services personally performed by YanTruitt MerleD. It was created on her behalf by DanTheresia Bough trained medical scribe. The creation of this record is based on the scribe's personal observations and the provider's statements to them.   I have reviewed the above documentation for accuracy and completeness, and I agree with the above.

## 2018-01-20 ENCOUNTER — Other Ambulatory Visit: Payer: Self-pay

## 2018-01-20 ENCOUNTER — Ambulatory Visit: Payer: Self-pay | Admitting: Hematology

## 2018-01-24 ENCOUNTER — Inpatient Hospital Stay: Payer: Medicaid Other

## 2018-01-24 ENCOUNTER — Inpatient Hospital Stay: Payer: Medicaid Other | Attending: Hematology

## 2018-01-24 ENCOUNTER — Ambulatory Visit: Payer: Medicaid Other | Admitting: Nutrition

## 2018-01-24 ENCOUNTER — Encounter: Payer: Self-pay | Admitting: Hematology

## 2018-01-24 ENCOUNTER — Inpatient Hospital Stay (HOSPITAL_BASED_OUTPATIENT_CLINIC_OR_DEPARTMENT_OTHER): Payer: Medicaid Other | Admitting: Hematology

## 2018-01-24 VITALS — BP 120/81 | HR 98 | Temp 98.4°F | Resp 20 | Ht 69.0 in | Wt 134.4 lb

## 2018-01-24 DIAGNOSIS — I82402 Acute embolism and thrombosis of unspecified deep veins of left lower extremity: Secondary | ICD-10-CM | POA: Insufficient documentation

## 2018-01-24 DIAGNOSIS — C186 Malignant neoplasm of descending colon: Secondary | ICD-10-CM | POA: Diagnosis not present

## 2018-01-24 DIAGNOSIS — C786 Secondary malignant neoplasm of retroperitoneum and peritoneum: Secondary | ICD-10-CM | POA: Diagnosis not present

## 2018-01-24 DIAGNOSIS — R630 Anorexia: Secondary | ICD-10-CM | POA: Insufficient documentation

## 2018-01-24 DIAGNOSIS — D509 Iron deficiency anemia, unspecified: Secondary | ICD-10-CM | POA: Insufficient documentation

## 2018-01-24 DIAGNOSIS — C189 Malignant neoplasm of colon, unspecified: Secondary | ICD-10-CM

## 2018-01-24 DIAGNOSIS — Z7901 Long term (current) use of anticoagulants: Secondary | ICD-10-CM

## 2018-01-24 DIAGNOSIS — R634 Abnormal weight loss: Secondary | ICD-10-CM | POA: Insufficient documentation

## 2018-01-24 DIAGNOSIS — D500 Iron deficiency anemia secondary to blood loss (chronic): Secondary | ICD-10-CM

## 2018-01-24 DIAGNOSIS — R21 Rash and other nonspecific skin eruption: Secondary | ICD-10-CM | POA: Insufficient documentation

## 2018-01-24 DIAGNOSIS — Z95828 Presence of other vascular implants and grafts: Secondary | ICD-10-CM

## 2018-01-24 DIAGNOSIS — Z86718 Personal history of other venous thrombosis and embolism: Secondary | ICD-10-CM

## 2018-01-24 DIAGNOSIS — D630 Anemia in neoplastic disease: Secondary | ICD-10-CM | POA: Insufficient documentation

## 2018-01-24 DIAGNOSIS — Z79899 Other long term (current) drug therapy: Secondary | ICD-10-CM | POA: Diagnosis not present

## 2018-01-24 DIAGNOSIS — E876 Hypokalemia: Secondary | ICD-10-CM | POA: Diagnosis not present

## 2018-01-24 DIAGNOSIS — C762 Malignant neoplasm of abdomen: Secondary | ICD-10-CM

## 2018-01-24 LAB — COMPREHENSIVE METABOLIC PANEL
ALBUMIN: 3.2 g/dL — AB (ref 3.5–5.0)
ALK PHOS: 97 U/L (ref 40–150)
AST: 18 U/L (ref 5–34)
Anion gap: 8 (ref 3–11)
BILIRUBIN TOTAL: 0.3 mg/dL (ref 0.2–1.2)
BUN: 11 mg/dL (ref 7–26)
CALCIUM: 9 mg/dL (ref 8.4–10.4)
CO2: 21 mmol/L — ABNORMAL LOW (ref 22–29)
CREATININE: 0.81 mg/dL (ref 0.70–1.30)
Chloride: 112 mmol/L — ABNORMAL HIGH (ref 98–109)
GFR calc Af Amer: >60 mL/min (ref >60)
GLUCOSE: 80 mg/dL (ref 70–140)
POTASSIUM: 4.2 mmol/L (ref 3.5–5.1)
Sodium: 141 mmol/L (ref 136–145)
TOTAL PROTEIN: 8.1 g/dL (ref 6.4–8.3)

## 2018-01-24 LAB — CBC WITH DIFFERENTIAL/PLATELET
BASOS ABS: 0 10*3/uL (ref 0.0–0.1)
BASOS PCT: 1 %
EOS ABS: 0.2 10*3/uL (ref 0.0–0.5)
EOS PCT: 4 %
HCT: 31.9 % — ABNORMAL LOW (ref 38.4–49.9)
Hemoglobin: 10 g/dL — ABNORMAL LOW (ref 13.0–17.1)
LYMPHS PCT: 25 %
Lymphs Abs: 1.6 10*3/uL (ref 0.9–3.3)
MCH: 28 pg (ref 27.2–33.4)
MCHC: 31.3 g/dL — ABNORMAL LOW (ref 32.0–36.0)
MCV: 89.4 fL (ref 79.3–98.0)
MONO ABS: 0.5 10*3/uL (ref 0.1–0.9)
Monocytes Relative: 8 %
Neutro Abs: 4 10*3/uL (ref 1.5–6.5)
Neutrophils Relative %: 62 %
PLATELETS: 153 10*3/uL (ref 140–400)
RBC: 3.57 MIL/uL — AB (ref 4.20–5.82)
RDW: 17.5 % — AB (ref 11.0–15.6)
WBC: 6.4 10*3/uL (ref 4.0–10.3)

## 2018-01-24 LAB — IRON AND TIBC
IRON: 67 ug/dL (ref 42–163)
Saturation Ratios: 25 % — ABNORMAL LOW (ref 42–163)
TIBC: 268 ug/dL (ref 202–409)
UIBC: 200 ug/dL

## 2018-01-24 LAB — MAGNESIUM: Magnesium: 1.6 mg/dL (ref 1.5–2.5)

## 2018-01-24 LAB — FERRITIN: FERRITIN: 138 ng/mL (ref 22–316)

## 2018-01-24 LAB — CEA (IN HOUSE-CHCC): CEA (CHCC-IN HOUSE): 7.57 ng/mL — AB (ref 0.00–5.00)

## 2018-01-24 MED ORDER — SODIUM CHLORIDE 0.9% FLUSH
10.0000 mL | INTRAVENOUS | Status: DC | PRN
  Administered 2018-01-24: 10 mL via INTRAVENOUS
  Filled 2018-01-24: qty 10

## 2018-01-24 MED ORDER — SODIUM CHLORIDE 0.9 % IV SOLN
Freq: Once | INTRAVENOUS | Status: AC
  Administered 2018-01-24: 11:00:00 via INTRAVENOUS

## 2018-01-24 MED ORDER — OXYCODONE HCL 10 MG PO TABS
10.0000 mg | ORAL_TABLET | ORAL | 0 refills | Status: DC | PRN
Start: 2018-01-24 — End: 2018-02-15

## 2018-01-24 MED ORDER — SODIUM CHLORIDE 0.9 % IV SOLN
2400.0000 mg/m2 | INTRAVENOUS | Status: DC
  Administered 2018-01-24: 3950 mg via INTRAVENOUS
  Filled 2018-01-24: qty 79

## 2018-01-24 MED ORDER — SODIUM CHLORIDE 0.9 % IV SOLN
6.5000 mg/kg | Freq: Once | INTRAVENOUS | Status: AC
  Administered 2018-01-24: 400 mg via INTRAVENOUS
  Filled 2018-01-24: qty 20

## 2018-01-24 MED ORDER — SODIUM CHLORIDE 0.9 % IV SOLN: Freq: Once | INTRAVENOUS | Status: DC

## 2018-01-24 MED ORDER — LEUCOVORIN CALCIUM INJECTION 350 MG
400.0000 mg/m2 | Freq: Once | INTRAVENOUS | Status: AC
  Administered 2018-01-24: 660 mg via INTRAVENOUS
  Filled 2018-01-24: qty 33

## 2018-01-24 MED ORDER — POTASSIUM CHLORIDE CRYS ER 20 MEQ PO TBCR: 20.0000 meq | EXTENDED_RELEASE_TABLET | Freq: Every day | ORAL | 2 refills | Status: DC

## 2018-01-24 MED ORDER — PROCHLORPERAZINE MALEATE 10 MG PO TABS
ORAL_TABLET | ORAL | Status: AC
  Filled 2018-01-24: qty 1

## 2018-01-24 MED ORDER — PROCHLORPERAZINE MALEATE 10 MG PO TABS
10.0000 mg | ORAL_TABLET | Freq: Once | ORAL | Status: AC
  Administered 2018-01-24: 10 mg via ORAL

## 2018-01-24 MED FILL — oxyCODONE HCL 10 MG TABS: 10 | 15 days supply | Qty: 90 | Fill #0

## 2018-01-24 MED FILL — POTASSIUM CL ER 20 MEQ TABL: 20 | 30 days supply | Qty: 30 | Fill #0

## 2018-01-24 NOTE — Progress Notes (Signed)
Nutrition follow-up completed with patient during infusion for metastatic colon cancer. Weight has improved and was documented as 134 pounds January 28 increased from 126.8 pounds December 17. Patient denies nutrition impact symptoms. Reports he found blood in his colostomy.  M.D. is aware. Patient reports he would like some additional Ensure Plus.  Nutrition diagnosis: Inadequate oral intake improved.  Intervention: Patient should continue oral nutrition supplements for additional calories and protein. Stressed importance of increased fluids as needed. Provided third complementary case of Ensure Plus. Teach back method used.  Monitoring, evaluation, goals: Patient will continue to work to increase calories and protein for weight stabilization.  Next visit: Scheduled as needed.  **Disclaimer: This note was dictated with voice recognition software. Similar sounding words can inadvertently be transcribed and this note may contain transcription errors which may not have been corrected upon publication of note.**

## 2018-01-24 NOTE — Patient Instructions (Signed)
[facility] Discharge Instructions for Patients Receiving Chemotherapy  Today you received the following chemotherapy agents: Vectibix, Leucovorin & (5FU) Florouracil.  To help prevent nausea and vomiting after your treatment, we encourage you to take your nausea medication as directed  If you develop nausea and vomiting that is not controlled by your nausea medication, call the clinic.   BELOW ARE SYMPTOMS THAT SHOULD BE REPORTED IMMEDIATELY:  *FEVER GREATER THAN 100.5 F  *CHILLS WITH OR WITHOUT FEVER  NAUSEA AND VOMITING THAT IS NOT CONTROLLED WITH YOUR NAUSEA MEDICATION  *UNUSUAL SHORTNESS OF BREATH  *UNUSUAL BRUISING OR BLEEDING  TENDERNESS IN MOUTH AND THROAT WITH OR WITHOUT PRESENCE OF ULCERS  *URINARY PROBLEMS  *BOWEL PROBLEMS  UNUSUAL RASH Items with * indicate a potential emergency and should be followed up as soon as possible.  Feel free to call the clinic you have any questions or concerns. The clinic phone number is [phone].

## 2018-01-25 ENCOUNTER — Telehealth: Payer: Self-pay

## 2018-01-25 NOTE — Telephone Encounter (Signed)
Per 1/28  No los

## 2018-01-26 ENCOUNTER — Inpatient Hospital Stay: Payer: Medicaid Other

## 2018-01-26 VITALS — BP 120/82 | HR 88 | Temp 98.2°F | Resp 18

## 2018-01-26 DIAGNOSIS — D500 Iron deficiency anemia secondary to blood loss (chronic): Secondary | ICD-10-CM

## 2018-01-26 DIAGNOSIS — Z95828 Presence of other vascular implants and grafts: Secondary | ICD-10-CM

## 2018-01-26 DIAGNOSIS — C186 Malignant neoplasm of descending colon: Secondary | ICD-10-CM | POA: Diagnosis not present

## 2018-01-26 MED ORDER — HEPARIN SOD (PORK) LOCK FLUSH 100 UNIT/ML IV SOLN
500.0000 [IU] | INTRAVENOUS | Status: DC | PRN
  Administered 2018-01-26: 500 [IU] via INTRAVENOUS
  Filled 2018-01-26: qty 5

## 2018-01-26 MED ORDER — SODIUM CHLORIDE 0.9% FLUSH
10.0000 mL | INTRAVENOUS | Status: DC | PRN
  Administered 2018-01-26: 10 mL via INTRAVENOUS
  Filled 2018-01-26: qty 10

## 2018-02-03 ENCOUNTER — Ambulatory Visit
Admission: RE | Admit: 2018-02-03 | Discharge: 2018-02-03 | Disposition: A | Discharge status: Home / Self Care | Payer: Medicaid Other | Source: Ambulatory Visit | Attending: Interventional Radiology | Admitting: Interventional Radiology

## 2018-02-03 ENCOUNTER — Encounter: Payer: Self-pay | Admitting: Radiology

## 2018-02-03 ENCOUNTER — Ambulatory Visit
Admission: RE | Admit: 2018-02-03 | Discharge: 2018-02-03 | Disposition: A | Discharge status: Home / Self Care | Payer: Medicaid Other | Source: Ambulatory Visit | Attending: Radiology | Admitting: Radiology

## 2018-02-03 DIAGNOSIS — I824Z9 Acute embolism and thrombosis of unspecified deep veins of unspecified distal lower extremity: Secondary | ICD-10-CM

## 2018-02-03 HISTORY — PX: IR RADIOLOGIST EVAL & MGMT: IMG5224

## 2018-02-03 NOTE — Progress Notes (Signed)
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 collection in the  left mid abdomen has with resolved. 5. Mild decrease in size of the multiloculated fluid collection in the splenic hilum and distal tail of the pancreas. 6. Stable 8 mm diffusely enhancing mass in the dome of the liver on the right. This is most likely benign. A solitary enhancing metastasis is less likely. 7. Moderate prostatic hypertrophy.  CT CP W Contrast 07/10/17 IMPRESSION: The salient finding is a new abscess measuring 3.8 x 4.6 cm inferior to the pancreatic tail and spleen.  CT CAP w contrast 07/05/2017  IMPRESSION: 1. Today's study demonstrates some positive response to therapy with decreased size of mass associated with the descending colon, decreased size of the invasive mass extending from the tail of the pancreas into the splenic hilum and decreased size of adjacent peritoneal lesions. There has also been regression of upper abdominal lymphadenopathy. 2. No new pulmonary or hepatic lesions identified to suggest progressive  metastatic disease. 3. Aortic atherosclerosis. 4. Additional incidental findings, as above.  CT CAP w Contrast 04/09/2017 IMPRESSION: 1. Response to therapy with decreased peritoneal tumor volume throughout the abdomen. 2. No well-defined residual colonic mass. No bowel obstruction or other acute complication. 3. Decrease in gastrohepatic ligament adenopathy. 4. No new sites of disease. 5.  No acute process or evidence of metastatic disease in the chest. 6.  Aortic atherosclerosis. 7. Mild gynecomastia.  Colonoscopy 01/12/2017 Impression: - Malignant near obstructing tumor in the descending colon and at the splenic flexure. Biopsied. Tattooed. - One 25 mm polyp at the recto-sigmoid colon, removed with a hot snare. Resected and retrieved. Clips (MR unsafe) were placed.  Lower Extremity Ultrasound 02/16/17 Bilateral lower extremity venous duplex complete. There is evidence of deep vein thrombosis involving the femoral, popliteal, and peroneal veins of the right lower extremity.  There is no evidence of superficial vein thrombosis involving the right lower extremity. There is evidence of superficial vein thrombosis involving the lesser saphenous vein of the left lower extremity. There is no evidence of deep vein thrombosis involving the left lower extremity. There is no evidence of a Baker's cyst bilaterally.  ASSESSMENT & PLAN:  57 y.o.  African-American male, without a significant past medical history, no routine medical care, presented with intermittent rectal bleeding, anemia  and worsening abdominal pain for 3 years.   1. 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  -His case was previously reviewed in our GI tumor Board, unfortunately he has peritoneum metastasis, with a large 11 cm peritoneal metastasis in the  left upper quadrant, which is consistent with peritoneal metastasis. -We previously reviewed the natural history of metastatic colon cancer. Unfortunately his cancer is incurable at this stage. We discussed the goal of therapy is palliative, to prolong his life and improve his quality of life. -He has started first line chemo FOLFOX. Due to history of pancytopenia, I'll skip the 5-FU bolus. -I previously discussed the [facility] genomic test result, which showed wild type KRAS, NRAS, and BRAF, MSI-stable. Based on this and his left-sided colon cancer, he will significantly benefit from EGFR inhibitor. He has started on panitumumab and has been tolerating well. -I previously reviewed the CT scan from 04/09/17 with the patient in detail. He has had excellent partial response to the chemotherapy treatment, no other new lesions, we'll continue current chemotherapy regimen. -I reviewed his restaging CT scan from 07/05/2017, which showed a continuous response, no  new lesions. We'll continue current treatment. -He previously developed a moderately thrombocytopenia, chemotherapy dose was reduced  --Due to Pancreatic Abscess,  His recent hospitalization, chemotherapy has been held. - he has  Being recovering well from his recent hospitalization, still in rehabilitation, but ambulates  Without any difficulty, he is also eating better. - due to his recent thrombosis and GI bleeding, he is not a candidate for Avastin for now. Given questionable disease progression, I recommend him to continue panitumumab for now - due to his recent bowel obstruction, I think he probably has had disease progression, although scan showed stable disease . I recommend to switch his chemotherapy to irinotecan alone, or FOLFIRI if he is able to tolerate.  - the goal of therapy is palliative. - we again discussed the incurable nature of his cancer, an overall poor prognosis. The goal of therapy is palliative. -Started irinotecan and  panitumumab on 09/21/17. Tolerated well with increased ostomy output. Added 5-fu back with cycle 2 on 10/04/17 and reduced FOLFIRI due to overall limited PS. Increased to full dose FOLFIRI on 10/18/17, he tolerated well overall with mild to moderate diarrhea. -encouraged him to manage his diarrhea with imodium and lomotil  -Due to his poor toleration of full dose FOLFIRI with diarrhea and emesis, I will reduce irinotecan back to 150m/m2. He has just recovered last cycle chemotherapy, [place] I will reduce his chemo dose further on 11/15/17 for cyle 5 -I suggest he increase his Lomotil to up to 6-8 tablets daily if he experiences significant diarrhea again.   -We previously discussed his CT CAP on 11/25/17 that indicates response to therapy of intraperitoneal metastasis including within the splenic hilum; no evidence of metastatic disease in the chest -Pt did not tolerated the cycle 5 FOLFIRI well, despite dose reduction on both agents.  He is very reluctant to continue same regiment. Given his restaging CT scan showed mixed response, but overall stable disease,  I will hold Irinotecan, and continue 5-FU/leucovorin and Panitumumab as maintenance therapy. He agreed and started on 11/29/17 -he has tolerated maintenance therapy well, without significant diarrhea.  -Due to his recent episode of extensive left lower extremity DVT, his treatment was held.  Chemo treatment restarted after he recovered from DVT -He had a CT abdomen and pelvis without contrast when he was in the hospital on January 08, 2018, which showed overall stable peritoneal metastasis. -Lab reviewed, adequate for treatment, will proceed 5-FU and Panitumumab today, and continue every 2 weeks     2. History of right LE DVT, and extensive LLE DVT  -I previously reviewed his ultrasound results from 02/17/17, which showed DVT involving the femoral, popliteal, and peritoneal vein of the right  lower extremity from 2/22. -He had history of right lower extremity DVT after surgery before  - his Xarelto has been held due to GI bleeding he has a IVC placed -No recurrence of bleeding since IVC placement.  -Pt was admitted to the hospital on 12/30/17 and discharge on 01/11/18. He presented with progressively worsening pain and swelling in the left leg. Iimaging studies notable for worsening left LE DVT. Initially hesitant to start anticoagulation due to known history of GI bleed while on Xarelto. [facility]. His left leg edema resolved after thrombectomy, he was discharged with Xarelto (he refused lovenox) 167mbid -He noticed recurrent GI bleeding around 01/25/2018, resolved after holding Xarelto.  Has restarted Xarelto at  50 mg daily dose, tolerating well without bleeding.  3. 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 -- continue oxycodone as needed, pain controlled  -Has also developed some back pain in the past few weeks, related to chemo.  Using oxycodone for the pain. -I called in Neurontin 155m for his shooting leg pain (11/15/17). He can start low dose with 1 tab at night and increase up to 3 tablets in the evening if he can tolerate it.  -Refilled Oxycodone on 12/13/17 -pain controlled -I advised him to take his Neurontin at night, he can take 200 mg.  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 -He has received IV Feraheme twice, but his anemia has not improved. He likely has component of anemia of chronic disease secondary to his underlying malignancy. -Ferritin 196 on 10/04/17, WNL, Hgb 9.8; will continue to monitor  -Persistent anemia, Hg improved to 11.2 (12/13/17). Continue ferrous Sulfate, no need for blood transfusion today.   5. Weight loss, malnutrition,  anorexia -Secondary to chemotherapy and cancer -The patient will continue to drink dietary supplements. -He has gained some weight lately - I again strongly encouraged him to try to eat more to avoid losing excess weight. I again recommended he drink 2 ensure or boost supplements a day to help. -I previously prescribed mirtazapine on 05/25/17 in attempts to help his appetite. He knows to take it once every evening. - continue follow-up with dietitian, previous f/u 10/22 -His weight is stable overall lately   6. History of pancreatic Abscess -Presents to hospital on 07/09/17 for abdominal pain. Based on SP CT from 07/10/17 he found to have an abscess. Draining tube placed and he received prolonged course of antibiotics  - His percutaneous drainage tube has been removed  -this has resolved   7. Bowel obstruction due to his colon mass at the splenic flexure and surrounding inflammation due to recent abscess, s/p diverting colostomy  -He was hospitalized on 07/29/17 for SBO  -He had sigmoidoscopy and loop Colostomy and has a J tube placed.  - he is tolerating diet well now. -Bowel movements overall have been normal, but sometimes he has softer stools and are more loose.  -I again reviewed the usage of both imodium and colace and in what context he should be using them both for his stool consistencies.  -He has colostomy prolapse, he knows to avoid lifting and carry heavy things -He will see his surgeon early 10/2017  -Diarrhea controlled  -He has loose to watery stools which improves a few days after chemo. I prescribed lomotil on 11/01/17. He should take imodium first and if not enough he should take lomotil to control his diarrhea.    8. Goal of care discussion  -We again discussed the incurable nature of his cancer, and the overall poor prognosis, especially if he does not have good response to chemotherapy or progress on chemo -The patient understands the goal of care is palliative. -I have  previously recommended DNR/DNI, he will think about it.  9. Skin rash  -Moderate pruritic acne type skin rash with boils to mid abdomen, likely secondary to vectibix, although the location is unusual with no rash elsewhere on the body - He used cleocin in the past; I refilled prescription on 11/29/17. He will use clindamycin with benzoyl peroxide gel BID. If this does not improve, he  will need oral antibiotics.  -Much improved, overall stable    Plan: -Reviewed, adequate for treatment, will proceed 5-FU and Panitumumab -We will continue Xarelto 15 mg daily -f/u in 2 weeks     All questions were answered. The patient knows to call the clinic with any problems, questions or concerns.  I spent 20 minutes counseling the patient face to face. The total time spent in the appointment was 25 minutes and more than 50% was on counseling.    [name], MD 02/07/2018
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
## 2018-02-07 ENCOUNTER — Inpatient Hospital Stay: Payer: Medicaid Other

## 2018-02-07 ENCOUNTER — Encounter: Payer: Self-pay | Admitting: Nutrition

## 2018-02-07 ENCOUNTER — Inpatient Hospital Stay (HOSPITAL_BASED_OUTPATIENT_CLINIC_OR_DEPARTMENT_OTHER): Payer: Medicaid Other | Admitting: Hematology

## 2018-02-07 ENCOUNTER — Inpatient Hospital Stay: Payer: Medicaid Other | Attending: Hematology

## 2018-02-07 VITALS — BP 115/79 | HR 96 | Temp 98.5°F | Resp 18 | Ht 69.0 in | Wt 132.0 lb

## 2018-02-07 DIAGNOSIS — Z86718 Personal history of other venous thrombosis and embolism: Secondary | ICD-10-CM | POA: Insufficient documentation

## 2018-02-07 DIAGNOSIS — C786 Secondary malignant neoplasm of retroperitoneum and peritoneum: Secondary | ICD-10-CM | POA: Insufficient documentation

## 2018-02-07 DIAGNOSIS — C259 Malignant neoplasm of pancreas, unspecified: Secondary | ICD-10-CM | POA: Diagnosis not present

## 2018-02-07 DIAGNOSIS — Z933 Colostomy status: Secondary | ICD-10-CM

## 2018-02-07 DIAGNOSIS — N400 Enlarged prostate without lower urinary tract symptoms: Secondary | ICD-10-CM | POA: Insufficient documentation

## 2018-02-07 DIAGNOSIS — C7889 Secondary malignant neoplasm of other digestive organs: Secondary | ICD-10-CM | POA: Insufficient documentation

## 2018-02-07 DIAGNOSIS — C189 Malignant neoplasm of colon, unspecified: Secondary | ICD-10-CM

## 2018-02-07 DIAGNOSIS — Z7901 Long term (current) use of anticoagulants: Secondary | ICD-10-CM

## 2018-02-07 DIAGNOSIS — Z79899 Other long term (current) drug therapy: Secondary | ICD-10-CM | POA: Diagnosis not present

## 2018-02-07 DIAGNOSIS — D509 Iron deficiency anemia, unspecified: Secondary | ICD-10-CM

## 2018-02-07 DIAGNOSIS — R21 Rash and other nonspecific skin eruption: Secondary | ICD-10-CM | POA: Insufficient documentation

## 2018-02-07 DIAGNOSIS — I700 Atherosclerosis of aorta: Secondary | ICD-10-CM | POA: Diagnosis not present

## 2018-02-07 DIAGNOSIS — C762 Malignant neoplasm of abdomen: Secondary | ICD-10-CM

## 2018-02-07 DIAGNOSIS — C186 Malignant neoplasm of descending colon: Secondary | ICD-10-CM | POA: Insufficient documentation

## 2018-02-07 DIAGNOSIS — Z95828 Presence of other vascular implants and grafts: Secondary | ICD-10-CM

## 2018-02-07 DIAGNOSIS — D630 Anemia in neoplastic disease: Secondary | ICD-10-CM

## 2018-02-07 DIAGNOSIS — E46 Unspecified protein-calorie malnutrition: Secondary | ICD-10-CM

## 2018-02-07 DIAGNOSIS — R634 Abnormal weight loss: Secondary | ICD-10-CM | POA: Diagnosis not present

## 2018-02-07 DIAGNOSIS — D500 Iron deficiency anemia secondary to blood loss (chronic): Secondary | ICD-10-CM

## 2018-02-07 LAB — COMPREHENSIVE METABOLIC PANEL
ALK PHOS: 85 U/L (ref 40–150)
ALT: 6 U/L (ref 0–55)
AST: 19 U/L (ref 5–34)
Albumin: 3.3 g/dL — ABNORMAL LOW (ref 3.5–5.0)
Anion gap: 7 (ref 3–11)
BILIRUBIN TOTAL: 0.3 mg/dL (ref 0.2–1.2)
BUN: 11 mg/dL (ref 7–26)
CALCIUM: 8.9 mg/dL (ref 8.4–10.4)
CHLORIDE: 108 mmol/L (ref 98–109)
CO2: 23 mmol/L (ref 22–29)
CREATININE: 0.81 mg/dL (ref 0.70–1.30)
Glucose, Bld: 84 mg/dL (ref 70–140)
Potassium: 4.2 mmol/L (ref 3.5–5.1)
Sodium: 138 mmol/L (ref 136–145)
TOTAL PROTEIN: 7.7 g/dL (ref 6.4–8.3)

## 2018-02-07 LAB — CBC WITH DIFFERENTIAL/PLATELET
BASOS ABS: 0 10*3/uL (ref 0.0–0.1)
BASOS PCT: 0 %
EOS ABS: 0.1 10*3/uL (ref 0.0–0.5)
EOS PCT: 2 %
HCT: 33.7 % — ABNORMAL LOW (ref 38.4–49.9)
HEMOGLOBIN: 10.7 g/dL — AB (ref 13.0–17.1)
Lymphocytes Relative: 24 %
Lymphs Abs: 1.3 10*3/uL (ref 0.9–3.3)
MCH: 28.6 pg (ref 27.2–33.4)
MCHC: 31.8 g/dL — ABNORMAL LOW (ref 32.0–36.0)
MCV: 90.1 fL (ref 79.3–98.0)
Monocytes Absolute: 0.5 10*3/uL (ref 0.1–0.9)
Monocytes Relative: 9 %
NEUTROS PCT: 65 %
Neutro Abs: 3.3 10*3/uL (ref 1.5–6.5)
PLATELETS: 115 10*3/uL — AB (ref 140–400)
RBC: 3.74 MIL/uL — AB (ref 4.20–5.82)
RDW: 16.9 % — ABNORMAL HIGH (ref 11.0–14.6)
WBC: 5.2 10*3/uL (ref 4.0–10.3)

## 2018-02-07 LAB — MAGNESIUM: MAGNESIUM: 1.5 mg/dL (ref 1.5–2.5)

## 2018-02-07 MED ORDER — SODIUM CHLORIDE 0.9 % IV SOLN
Freq: Once | INTRAVENOUS | Status: AC
  Administered 2018-02-07: 11:00:00 via INTRAVENOUS

## 2018-02-07 MED ORDER — LEUCOVORIN CALCIUM INJECTION 350 MG
400.0000 mg/m2 | Freq: Once | INTRAVENOUS | Status: AC
  Administered 2018-02-07: 660 mg via INTRAVENOUS
  Filled 2018-02-07: qty 33

## 2018-02-07 MED ORDER — SODIUM CHLORIDE 0.9 % IV SOLN
6.5000 mg/kg | Freq: Once | INTRAVENOUS | Status: AC
  Administered 2018-02-07: 400 mg via INTRAVENOUS
  Filled 2018-02-07: qty 20

## 2018-02-07 MED ORDER — SODIUM CHLORIDE 0.9% FLUSH
10.0000 mL | INTRAVENOUS | Status: DC | PRN
  Administered 2018-02-07: 10 mL via INTRAVENOUS
  Filled 2018-02-07: qty 10

## 2018-02-07 MED ORDER — PROCHLORPERAZINE MALEATE 10 MG PO TABS
10.0000 mg | ORAL_TABLET | Freq: Once | ORAL | Status: AC
  Administered 2018-02-07: 10 mg via ORAL

## 2018-02-07 MED ORDER — SODIUM CHLORIDE 0.9 % IV SOLN
2400.0000 mg/m2 | INTRAVENOUS | Status: DC
  Administered 2018-02-07: 3950 mg via INTRAVENOUS
  Filled 2018-02-07: qty 79

## 2018-02-07 MED ORDER — PROCHLORPERAZINE MALEATE 10 MG PO TABS
ORAL_TABLET | ORAL | Status: AC
  Filled 2018-02-07: qty 1

## 2018-02-07 NOTE — Patient Instructions (Signed)
[facility] Discharge Instructions for Patients Receiving Chemotherapy  Today you received the following chemotherapy agents Vectibix, leucovorin, and 5FU  To help prevent nausea and vomiting after your treatment, we encourage you to take your nausea medication as directed   If you develop nausea and vomiting that is not controlled by your nausea medication, call the clinic.   BELOW ARE SYMPTOMS THAT SHOULD BE REPORTED IMMEDIATELY:  *FEVER GREATER THAN 100.5 F  *CHILLS WITH OR WITHOUT FEVER  NAUSEA AND VOMITING THAT IS NOT CONTROLLED WITH YOUR NAUSEA MEDICATION  *UNUSUAL SHORTNESS OF BREATH  *UNUSUAL BRUISING OR BLEEDING  TENDERNESS IN MOUTH AND THROAT WITH OR WITHOUT PRESENCE OF ULCERS  *URINARY PROBLEMS  *BOWEL PROBLEMS  UNUSUAL RASH Items with * indicate a potential emergency and should be followed up as soon as possible.  Feel free to call the clinic should you have any questions or concerns. The clinic phone number is [phone].  Please show the [facility] at check-in to the Emergency Department and triage nurse.

## 2018-02-08 ENCOUNTER — Telehealth: Payer: Self-pay | Admitting: Hematology

## 2018-02-08 NOTE — Telephone Encounter (Signed)
Spoke to patient regarding upcoming February appointment updates.

## 2018-02-09 ENCOUNTER — Inpatient Hospital Stay: Payer: Medicaid Other

## 2018-02-09 VITALS — BP 98/62 | HR 86 | Temp 98.1°F | Resp 18

## 2018-02-09 DIAGNOSIS — C186 Malignant neoplasm of descending colon: Secondary | ICD-10-CM | POA: Diagnosis not present

## 2018-02-09 DIAGNOSIS — C189 Malignant neoplasm of colon, unspecified: Secondary | ICD-10-CM

## 2018-02-09 MED ORDER — SODIUM CHLORIDE 0.9% FLUSH
10.0000 mL | INTRAVENOUS | Status: DC | PRN
  Administered 2018-02-09: 10 mL
  Filled 2018-02-09: qty 10

## 2018-02-09 MED ORDER — HEPARIN SOD (PORK) LOCK FLUSH 100 UNIT/ML IV SOLN
500.0000 [IU] | Freq: Once | INTRAVENOUS | Status: AC | PRN
  Administered 2018-02-09: 500 [IU]
  Filled 2018-02-09: qty 5

## 2018-02-12 ENCOUNTER — Encounter: Payer: Self-pay | Admitting: Hematology

## 2018-02-14 ENCOUNTER — Telehealth: Payer: Self-pay | Admitting: Medical Oncology

## 2018-02-14 NOTE — Telephone Encounter (Signed)
2 calls re refills -first was just for oxycodone then 2nd call " I need all my  medicines refilled".

## 2018-02-15 ENCOUNTER — Telehealth: Payer: Self-pay | Admitting: *Deleted

## 2018-02-15 ENCOUNTER — Other Ambulatory Visit: Payer: Self-pay | Admitting: Nurse Practitioner

## 2018-02-15 ENCOUNTER — Other Ambulatory Visit: Payer: Self-pay | Admitting: Hematology

## 2018-02-15 DIAGNOSIS — C189 Malignant neoplasm of colon, unspecified: Secondary | ICD-10-CM

## 2018-02-15 MED ORDER — OXYCODONE HCL 10 MG PO TABS: 10.0000 mg | ORAL_TABLET | ORAL | 0 refills | Status: DC | PRN

## 2018-02-15 MED FILL — HYDROCORTISONE 2.5% CREAM: 2.5 | 10 days supply | Qty: 30 | Fill #1

## 2018-02-15 MED FILL — oxyCODONE HCL 10 MG TABS: 10 | 15 days supply | Qty: 90 | Fill #0

## 2018-02-15 MED FILL — XARELTO 20 MG TABLET: 20 | 30 days supply | Qty: 30 | Fill #2

## 2018-02-15 NOTE — Telephone Encounter (Signed)
Daughter informed narcotic refill for pain needs to be picked up from [facility] with this call reporting "going back and forth, the pharmacy has not yet received the pain refill."  Please call daughter or patient when prescription ready for pick up.

## 2018-02-15 NOTE — Telephone Encounter (Signed)
Pt called requesting refill for Oxycodone and all of his other medications.  Stated he is not out of any meds.  Instructed pt to call his pharmacy to request refills of his regular meds.  [name] will be notified of Oxycodone refill. Pt has f/u appt on  02/21/18.

## 2018-02-15 NOTE — Telephone Encounter (Signed)
Jeff [name],  Please see the telephone notes in his chart. He has multiple pharmacy in chart, could you call pt today and refill his oxycodone and other meds he needs to the pharmacy he uses? It will take a few days to get other meds filled through his pharmacy's request. Thanks much.    [name] MD

## 2018-02-15 NOTE — Telephone Encounter (Signed)
Patient's daughter called requesting refill for all medications.  Return his call to [phone] or call me at [phone].Marland Kitchen

## 2018-02-18 ENCOUNTER — Other Ambulatory Visit: Payer: Self-pay | Admitting: *Deleted
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
## 2018-02-21 ENCOUNTER — Inpatient Hospital Stay: Payer: Medicaid Other

## 2018-02-21 ENCOUNTER — Telehealth: Payer: Self-pay | Admitting: Emergency Medicine

## 2018-02-21 ENCOUNTER — Inpatient Hospital Stay: Payer: Medicaid Other | Admitting: Nutrition

## 2018-02-21 ENCOUNTER — Inpatient Hospital Stay (HOSPITAL_BASED_OUTPATIENT_CLINIC_OR_DEPARTMENT_OTHER): Payer: Medicaid Other | Admitting: Medical

## 2018-02-21 ENCOUNTER — Telehealth: Payer: Self-pay | Admitting: *Deleted

## 2018-02-21 VITALS — BP 109/83 | HR 81 | Temp 97.9°F | Resp 20 | Ht 69.0 in

## 2018-02-21 DIAGNOSIS — C259 Malignant neoplasm of pancreas, unspecified: Secondary | ICD-10-CM

## 2018-02-21 DIAGNOSIS — C762 Malignant neoplasm of abdomen: Secondary | ICD-10-CM

## 2018-02-21 DIAGNOSIS — D500 Iron deficiency anemia secondary to blood loss (chronic): Secondary | ICD-10-CM

## 2018-02-21 DIAGNOSIS — R531 Weakness: Secondary | ICD-10-CM | POA: Diagnosis not present

## 2018-02-21 DIAGNOSIS — C189 Malignant neoplasm of colon, unspecified: Secondary | ICD-10-CM | POA: Diagnosis not present

## 2018-02-21 DIAGNOSIS — C186 Malignant neoplasm of descending colon: Secondary | ICD-10-CM | POA: Diagnosis not present

## 2018-02-21 DIAGNOSIS — Z95828 Presence of other vascular implants and grafts: Secondary | ICD-10-CM

## 2018-02-21 DIAGNOSIS — C786 Secondary malignant neoplasm of retroperitoneum and peritoneum: Secondary | ICD-10-CM

## 2018-02-21 LAB — CBC WITH DIFFERENTIAL/PLATELET
BASOS ABS: 0.1 10*3/uL (ref 0.0–0.1)
Basophils Relative: 1 %
EOS ABS: 0.1 10*3/uL (ref 0.0–0.5)
EOS PCT: 2 %
HCT: 38.2 % — ABNORMAL LOW (ref 38.4–49.9)
HEMOGLOBIN: 12.2 g/dL — AB (ref 13.0–17.1)
LYMPHS PCT: 26 %
Lymphs Abs: 1.7 10*3/uL (ref 0.9–3.3)
MCH: 28.7 pg (ref 27.2–33.4)
MCHC: 31.9 g/dL — ABNORMAL LOW (ref 32.0–36.0)
MCV: 90.1 fL (ref 79.3–98.0)
Monocytes Absolute: 0.5 10*3/uL (ref 0.1–0.9)
Monocytes Relative: 8 %
NEUTROS PCT: 63 %
Neutro Abs: 4.2 10*3/uL (ref 1.5–6.5)
PLATELETS: 153 10*3/uL (ref 140–400)
RBC: 4.24 MIL/uL (ref 4.20–5.82)
RDW: 16.7 % — ABNORMAL HIGH (ref 11.0–14.6)
WBC: 6.7 10*3/uL (ref 4.0–10.3)

## 2018-02-21 LAB — COMPREHENSIVE METABOLIC PANEL
ALT: 9 U/L (ref 0–55)
AST: 24 U/L (ref 5–34)
Albumin: 3.8 g/dL (ref 3.5–5.0)
Alkaline Phosphatase: 70 U/L (ref 40–150)
Anion gap: 8 (ref 3–11)
BILIRUBIN TOTAL: 0.5 mg/dL (ref 0.2–1.2)
BUN: 7 mg/dL (ref 7–26)
CO2: 23 mmol/L (ref 22–29)
CREATININE: 0.94 mg/dL (ref 0.70–1.30)
Calcium: 9.4 mg/dL (ref 8.4–10.4)
Chloride: 106 mmol/L (ref 98–109)
Glucose, Bld: 88 mg/dL (ref 70–140)
POTASSIUM: 4.2 mmol/L (ref 3.5–5.1)
Sodium: 137 mmol/L (ref 136–145)
Total Protein: 8.7 g/dL — ABNORMAL HIGH (ref 6.4–8.3)

## 2018-02-21 LAB — MAGNESIUM: MAGNESIUM: 1.4 mg/dL — AB (ref 1.5–2.5)

## 2018-02-21 LAB — CEA (IN HOUSE-CHCC): CEA (CHCC-In House): 9.14 ng/mL — ABNORMAL HIGH (ref 0.00–5.00)

## 2018-02-21 LAB — FERRITIN: Ferritin: 137 ng/mL (ref 22–316)

## 2018-02-21 MED ORDER — MAGNESIUM SULFATE 2 GM/50ML IV SOLN
2.0000 g | Freq: Once | INTRAVENOUS | Status: AC
Start: 2018-02-21 — End: 2018-02-21
  Administered 2018-02-21: 2 g via INTRAVENOUS
  Filled 2018-02-21: qty 50

## 2018-02-21 MED ORDER — ALTEPLASE 2 MG IJ SOLR
2.0000 mg | Freq: Once | INTRAMUSCULAR | Status: DC | PRN
  Filled 2018-02-21: qty 2

## 2018-02-21 MED ORDER — HEPARIN SOD (PORK) LOCK FLUSH 100 UNIT/ML IV SOLN
250.0000 [IU] | Freq: Once | INTRAVENOUS | Status: DC | PRN
  Filled 2018-02-21: qty 5

## 2018-02-21 MED ORDER — HEPARIN SOD (PORK) LOCK FLUSH 100 UNIT/ML IV SOLN
500.0000 [IU] | Freq: Once | INTRAVENOUS | Status: AC | PRN
  Administered 2018-02-21: 500 [IU]
  Filled 2018-02-21: qty 5

## 2018-02-21 MED ORDER — ALTEPLASE 2 MG IJ SOLR
INTRAMUSCULAR | Status: AC
  Filled 2018-02-21: qty 2

## 2018-02-21 MED ORDER — SODIUM CHLORIDE 0.9% FLUSH
3.0000 mL | Freq: Once | INTRAVENOUS | Status: AC | PRN
  Administered 2018-02-21: 15:00:00 via INTRAVENOUS
  Filled 2018-02-21: qty 10

## 2018-02-21 MED ORDER — MAGNESIUM SULFATE 50 % IJ SOLN
2000.0000 mg | Freq: Once | INTRAMUSCULAR | Status: DC
  Filled 2018-02-21: qty 4

## 2018-02-21 MED ORDER — ALTEPLASE 2 MG IJ SOLR
2.0000 mg | Freq: Once | INTRAMUSCULAR | Status: AC | PRN
  Administered 2018-02-21: 2 mg
  Filled 2018-02-21: qty 2

## 2018-02-21 NOTE — Patient Instructions (Signed)
Implanted Port Home Guide An implanted port is a type of central line that is placed under the skin. Central lines are used to provide IV access when treatment or nutrition needs to be given through a person's veins. Implanted ports are used for long-term IV access. An implanted port may be placed because:  You need IV medicine that would be irritating to the small veins in your hands or arms.  You need long-term IV medicines, such as antibiotics.  You need IV nutrition for a long period.  You need frequent blood draws for lab tests.  You need dialysis.  Implanted ports are usually placed in the chest area, but they can also be placed in the upper arm, the abdomen, or the leg. An implanted port has two main parts:  Reservoir. The reservoir is round and will appear as a small, raised area under your skin. The reservoir is the part where a needle is inserted to give medicines or draw blood.  Catheter. The catheter is a thin, flexible tube that extends from the reservoir. The catheter is placed into a large vein. Medicine that is inserted into the reservoir goes into the catheter and then into the vein.  How will I care for my incision site? Do not get the incision site wet. Bathe or shower as directed by your health care provider. How is my port accessed? Special steps must be taken to access the port:  Before the port is accessed, a numbing cream can be placed on the skin. This helps numb the skin over the port site.  Your health care provider uses a sterile technique to access the port. ? Your health care provider must put on a mask and sterile gloves. ? The skin over your port is cleaned carefully with an antiseptic and allowed to dry. ? The port is gently pinched between sterile gloves, and a needle is inserted into the port.  Only "non-coring" port needles should be used to access the port. Once the port is accessed, a blood return should be checked. This helps ensure that the port  is in the vein and is not clogged.  If your port needs to remain accessed for a constant infusion, a clear (transparent) bandage will be placed over the needle site. The bandage and needle will need to be changed every week, or as directed by your health care provider.  Keep the bandage covering the needle clean and dry. Do not get it wet. Follow your health care provider's instructions on how to take a shower or bath while the port is accessed.  If your port does not need to stay accessed, no bandage is needed over the port.  What is flushing? Flushing helps keep the port from getting clogged. Follow your health care provider's instructions on how and when to flush the port. Ports are usually flushed with saline solution or a medicine called heparin. The need for flushing will depend on how the port is used.  If the port is used for intermittent medicines or blood draws, the port will need to be flushed: ? After medicines have been given. ? After blood has been drawn. ? As part of routine maintenance.  If a constant infusion is running, the port may not need to be flushed.  How long will my port stay implanted? The port can stay in for as long as your health care provider thinks it is needed. When it is time for the port to come out, surgery will be   done to remove it. The procedure is similar to the one performed when the port was put in. When should I seek immediate medical care? When you have an implanted port, you should seek immediate medical care if:  You notice a bad smell coming from the incision site.  You have swelling, redness, or drainage at the incision site.  You have more swelling or pain at the port site or the surrounding area.  You have a fever that is not controlled with medicine.  This information is not intended to replace advice given to you by your health care provider. Make sure you discuss any questions you have with your health care provider. Document  Released: 12/14/2005 Document Revised: 05/21/2016 Document Reviewed: 08/21/2013 Elsevier Interactive Patient Education  2017 [employer]  

## 2018-02-21 NOTE — Telephone Encounter (Signed)
"  I'm at work.  My father is in the waiting room.  He is not feeling well.  His appointment was at 8:00 am.  A nurse needs e to see him.  He's having stomach pain, problems with bowels."    Noted patient qued for today's appointments for lab, flush, A.P.P. Burton at time of call.   Scheduled for infusion at 10:30 am.  [name] notified of appointment status.  Just in case symptoms not shared with provider, Triage seeking to notify provider or nurse.

## 2018-02-21 NOTE — Telephone Encounter (Signed)
PT arrived to [facility] 1 hour after lab appt time. Pt states he did no want his labs drawn or infusion today. Only wants to see [facility] NP. After speaking to pt, he stated he wanted to go to the ER because he felt so bad. C/o of Nausea/weakness/diarrhea. Informed NP of pt feeling bad. NP requested if symptom management would see pt. Sandi Mealy PA agreed to see patient for symptom management. Talked to patient again who agreed to labs and port flush. Elmyra Ricks RN drawing labs and flushing pts port.
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
## 2018-03-05 NOTE — Progress Notes (Signed)
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 process. Wall thickening due to colon neoplasm also remains a possibility. 2. Dilated, fluid-filled appendix, similar to the dilated loops of colon and small bowel, compatible with dilatation due to the bowel obstruction. There are no findings to indicate appendicitis. 3. Small amount of free peritoneal fluid. 4. The percutaneously drained fluid collection in the left mid abdomen has with resolved. 5. Mild decrease in size of the multiloculated fluid collection in the splenic hilum and distal tail of the pancreas. 6. Stable 8 mm diffusely enhancing mass in the dome of the liver on the right. This is most likely benign. A solitary enhancing metastasis is less likely. 7. Moderate prostatic hypertrophy.  CT CP W Contrast 07/10/17 IMPRESSION: The salient finding is a new abscess measuring 3.8 x 4.6 cm inferior to the pancreatic tail and spleen.  CT CAP w contrast 07/05/2017  IMPRESSION: 1. Today's study demonstrates some positive response to therapy with decreased size  of mass associated with the descending colon, decreased size of the invasive mass extending from the tail of the pancreas into the splenic hilum and decreased size of adjacent peritoneal lesions. There has also been regression of upper abdominal lymphadenopathy. 2. No new pulmonary or hepatic lesions identified to suggest progressive metastatic disease. 3. Aortic atherosclerosis. 4. Additional incidental findings, as above.  CT CAP w Contrast 04/09/2017 IMPRESSION: 1. Response to therapy with decreased peritoneal tumor volume throughout the abdomen. 2. No well-defined residual colonic mass. No bowel obstruction or other acute complication. 3. Decrease in gastrohepatic ligament adenopathy. 4. No new sites of disease. 5.  No acute process or evidence of metastatic disease in the chest. 6.  Aortic atherosclerosis. 7. Mild gynecomastia.  Colonoscopy 01/12/2017 Impression: - Malignant near obstructing tumor in the descending colon and at the splenic flexure. Biopsied. Tattooed. - One 25 mm polyp at the recto-sigmoid colon, removed with a hot snare. Resected and retrieved. Clips (MR unsafe) were placed.  Lower Extremity Ultrasound 02/16/17 Bilateral lower extremity venous duplex complete. There is evidence of deep vein thrombosis involving the femoral, popliteal, and peroneal veins of the right lower extremity.  There is no evidence of superficial vein thrombosis involving the right lower extremity. There is evidence of superficial vein thrombosis involving the lesser saphenous vein of the left lower extremity. There is no evidence of deep vein thrombosis involving the left lower extremity. There is no evidence of a Baker's cyst bilaterally.  ASSESSMENT & PLAN:  57 y.o.  African-American male, without a significant past medical history, no routine medical care, presented with intermittent rectal bleeding, anemia  and worsening abdominal pain for 3 years.   1. 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  -His case was previously reviewed in our GI tumor Board, unfortunately he has peritoneum metastasis, with a large 11 cm peritoneal metastasis in the left upper quadrant, which is consistent with peritoneal metastasis. -We previously reviewed the natural history of metastatic colon cancer. Unfortunately his cancer is incurable at this stage. We discussed the goal of therapy is palliative, to prolong his life and improve his quality of life. -He has started first line chemo FOLFOX. Due to history of pancytopenia, I'll skip the 5-FU bolus. -I previously discussed the [employer] [facility] genomic test result, which showed wild type KRAS, NRAS, and BRAF, MSI-stable. Based on this and his  left-sided colon cancer, he will significantly benefit from EGFR inhibitor. He has started on panitumumab and has been tolerating well. -I previously reviewed the CT scan from 04/09/17 with the patient in detail. He has had excellent partial response to the chemotherapy treatment, no other new lesions, we'll continue current chemotherapy regimen. -I reviewed his restaging CT scan from 07/05/2017, which showed a continuous response, no new lesions. We'll continue current treatment. -He previously developed a moderately thrombocytopenia, chemotherapy dose was reduced  --Due to Pancreatic Abscess,  His recent hospitalization, chemotherapy has been held. - he has  Being recovering well from his recent hospitalization, still in rehabilitation, but ambulates  Without any difficulty, he is also eating better. - due to his recent thrombosis and GI bleeding, he is not a candidate for Avastin for now. Given questionable disease progression, I recommend him to continue panitumumab for now - due to his recent bowel obstruction, I think he probably has had disease  progression, although scan showed stable disease . I recommend to switch his chemotherapy to irinotecan alone, or FOLFIRI if he is able to tolerate.  - the goal of therapy is palliative. - we again discussed the incurable nature of his cancer, an overall poor prognosis. The goal of therapy is palliative. -Started irinotecan and panitumumab on 09/21/17. Tolerated well with increased ostomy output. Added 5-fu back with cycle 2 on 10/04/17 and reduced FOLFIRI due to overall limited PS. Increased to full dose FOLFIRI on 10/18/17, he tolerated well overall with mild to moderate diarrhea. -encouraged him to manage his diarrhea with imodium and lomotil  -I suggest he increase his Lomotil to up to 6-8 tablets daily if he experiences significant diarrhea again.   -We previously discussed his CT CAP on 11/25/17 that indicates response to therapy of intraperitoneal metastasis including within the splenic hilum; no evidence of metastatic disease in the chest -Pt did not tolerated the cycle 5 FOLFIRI well, despite dose reduction on both agents.  He is very reluctant to continue same regiment. Given his restaging CT scan showed mixed response, but overall stable disease,  I will hold Irinotecan, and continue 5-FU/leucovorin and Panitumumab as maintenance therapy. He agreed and started on 11/29/17 -he has tolerated maintenance therapy well, without significant diarrhea.  -During his episode of extensive left lower extremity DVT on 01/25/18, his treatment was held.  Chemo treatment restarted after he recovered from DVT -He had a CT abdomen and pelvis without contrast when he was in the hospital on January 08, 2018, which showed overall stable peritoneal metastasis. -Pt did not want to proceed with chemo on 02/21/18 due to feeling down and cold symptoms.  -CT CAP W Contrast in 4 weeks  -Lab reviewed, adequate for treatment, will proceed 5-FU and Panitumumab today, and continue every 2 weeks     2. History of right LE DVT,  and extensive LLE DVT  -I previously reviewed his ultrasound results from 02/17/17, which showed DVT involving the femoral, popliteal, and peritoneal vein of the right lower extremity from 2/22. -He had history of right lower extremity DVT after surgery before  - his Xarelto has been held due to GI bleeding he has a IVC placed -No recurrence of bleeding since IVC placement.  -Pt was admitted to the hospital on 12/30/17 and discharge on 01/11/18. He presented with progressively worsening pain and swelling in the left leg. Iimaging studies notable for worsening left LE DVT. Initially hesitant to start anticoagulation due to known history of GI bleed while on Xarelto.  Started on Eliquis and transferred to [facility] for thrombectomy. His left leg edema resolved after thrombectomy, he was discharged with Xarelto (he refused lovenox) 37m bid -He noticed recurrent GI bleeding around 01/25/2018, resolved after holding Xarelto.  Has restarted Xarelto at 20 mg daily dose, tolerating well without bleeding. -I will refill his Xarelto. He will switch to 15 mg next bottle.   3. 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 -- continue oxycodone as needed, pain controlled  -Has also developed some back pain in the past few weeks, related to chemo.  Using oxycodone for the pain. -I called in Neurontin 102mfor his shooting leg pain (11/15/17). He can start low dose with 1 tab at night and increase up to 3 tablets in the evening if he can tolerate it.  -Refilled Oxycodone on 12/13/17 -pain controlled -I advised him to take his Neurontin at night, he can take 200 mg.  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 -He has received IV Feraheme twice, but his anemia has not improved. He likely has component of anemia of chronic disease secondary to his underlying  malignancy. -Ferritin 196 on 10/04/17, WNL, Hgb 9.8; will continue to monitor  -Persistent anemia, Hg improved to 12.3 (03/07/18). Continue ferrous Sulfate, no need for blood transfusion today.  5. Weight loss, malnutrition, anorexia -Secondary to chemotherapy and cancer -The patient will continue to drink dietary supplements. -He has gained some weight lately - I again strongly encouraged him to try to eat more to avoid losing excess weight. I again recommended he drink 2 ensure or boost supplements a day to help. -I previously prescribed mirtazapine on 05/25/17 in attempts to help his appetite. He knows to take it once every evening. - continue follow-up with dietitian, previous f/u 10/22 -His weight is stable overall lately   6. History of pancreatic Abscess -Presents to hospital on 07/09/17 for abdominal pain. Based on SP CT from 07/10/17 he found to have an abscess. Draining tube placed and he received prolonged course of antibiotics  - His percutaneous drainage tube has been removed  -this has resolved   7. Bowel obstruction due to his colon mass at the splenic flexure and surrounding inflammation due to recent abscess, s/p diverting colostomy  -He was hospitalized on 07/29/17 for SBO  -He had sigmoidoscopy and loop Colostomy and has a J tube placed.  - he is tolerating diet well now. -Bowel movements overall have been normal, but sometimes he has softer stools and are more loose.  -I again reviewed the usage of both imodium and colace and in what context he should be using them both for his stool consistencies.  -He has colostomy prolapse, he knows to avoid lifting and carry heavy things -He will see his surgeon early 10/2017  -Diarrhea controlled  -He has loose to watery stools which improves a few days after chemo. I prescribed lomotil on 11/01/17. He should take imodium first and if not enough he should take lomotil to control his diarrhea.    8. Goal of care discussion  -We again  discussed the incurable nature of his cancer, and the overall poor prognosis, especially if he does not have good response to chemotherapy or progress on chemo -The patient understands the goal of care is palliative. -I have previously recommended DNR/DNI, he will think about it.  9.  Skin rash  -Moderate pruritic acne type skin rash with boils to mid abdomen, likely secondary to vectibix, although the location is unusual with no rash elsewhere on the body - He used cleocin in the past; I refilled prescription on 11/29/17. He will use clindamycin with benzoyl peroxide gel BID. If this does not improve, he will need oral antibiotics.  -Much improved, overall stable    Plan: -Labs reviewed, adequate for treatment, will proceed 5-FU and Panitumumab today and continue every 2 weeks  -We will chang Xarelto from 50m to 15 mg daily after he finishes his current prescription due to his risk of GI bleeding, refilled today -f/u in 2 weeks with lacie -CT CAP W Contrast in 4 weeks and F/u with me after    All questions were answered. The patient knows to call the clinic with any problems, questions or concerns.  I spent 20 minutes counseling the patient face to face. The total time spent in the appointment was 25 minutes and more than 50% was on counseling.  This document serves as a record of services personally performed by YTruitt Merle MD. It was created on her behalf by DTheresia Bough a trained medical scribe. The creation of this record is based on the scribe's personal observations and the provider's statements to them.   I have reviewed the above documentation for accuracy and completeness, and I agree with the above.    YTruitt Merle MD 03/07/18

## 2018-03-07 ENCOUNTER — Inpatient Hospital Stay: Payer: Medicaid Other

## 2018-03-07 ENCOUNTER — Inpatient Hospital Stay (HOSPITAL_BASED_OUTPATIENT_CLINIC_OR_DEPARTMENT_OTHER): Payer: Medicaid Other | Admitting: Hematology

## 2018-03-07 ENCOUNTER — Telehealth: Payer: Self-pay | Admitting: Hematology

## 2018-03-07 ENCOUNTER — Inpatient Hospital Stay: Payer: Medicaid Other | Attending: Hematology

## 2018-03-07 ENCOUNTER — Encounter: Payer: Self-pay | Admitting: Hematology

## 2018-03-07 ENCOUNTER — Other Ambulatory Visit: Payer: Self-pay | Admitting: Nurse Practitioner

## 2018-03-07 VITALS — BP 110/89 | HR 89 | Temp 97.9°F | Resp 18 | Ht 69.0 in | Wt 135.0 lb

## 2018-03-07 DIAGNOSIS — C186 Malignant neoplasm of descending colon: Secondary | ICD-10-CM

## 2018-03-07 DIAGNOSIS — K922 Gastrointestinal hemorrhage, unspecified: Secondary | ICD-10-CM | POA: Insufficient documentation

## 2018-03-07 DIAGNOSIS — M79606 Pain in leg, unspecified: Secondary | ICD-10-CM

## 2018-03-07 DIAGNOSIS — Z5112 Encounter for antineoplastic immunotherapy: Secondary | ICD-10-CM | POA: Diagnosis present

## 2018-03-07 DIAGNOSIS — Z7901 Long term (current) use of anticoagulants: Secondary | ICD-10-CM | POA: Insufficient documentation

## 2018-03-07 DIAGNOSIS — C786 Secondary malignant neoplasm of retroperitoneum and peritoneum: Secondary | ICD-10-CM

## 2018-03-07 DIAGNOSIS — L270 Generalized skin eruption due to drugs and medicaments taken internally: Secondary | ICD-10-CM

## 2018-03-07 DIAGNOSIS — C189 Malignant neoplasm of colon, unspecified: Secondary | ICD-10-CM

## 2018-03-07 DIAGNOSIS — R630 Anorexia: Secondary | ICD-10-CM

## 2018-03-07 DIAGNOSIS — C762 Malignant neoplasm of abdomen: Secondary | ICD-10-CM

## 2018-03-07 DIAGNOSIS — G893 Neoplasm related pain (acute) (chronic): Secondary | ICD-10-CM | POA: Diagnosis not present

## 2018-03-07 DIAGNOSIS — Z79899 Other long term (current) drug therapy: Secondary | ICD-10-CM | POA: Insufficient documentation

## 2018-03-07 DIAGNOSIS — D630 Anemia in neoplastic disease: Secondary | ICD-10-CM | POA: Diagnosis not present

## 2018-03-07 DIAGNOSIS — D509 Iron deficiency anemia, unspecified: Secondary | ICD-10-CM

## 2018-03-07 DIAGNOSIS — Z933 Colostomy status: Secondary | ICD-10-CM

## 2018-03-07 DIAGNOSIS — E876 Hypokalemia: Secondary | ICD-10-CM | POA: Diagnosis not present

## 2018-03-07 DIAGNOSIS — Z86718 Personal history of other venous thrombosis and embolism: Secondary | ICD-10-CM

## 2018-03-07 LAB — COMPREHENSIVE METABOLIC PANEL
ALK PHOS: 94 U/L (ref 40–150)
ALT: 7 U/L (ref 0–55)
AST: 18 U/L (ref 5–34)
Albumin: 3.4 g/dL — ABNORMAL LOW (ref 3.5–5.0)
Anion gap: 5 (ref 3–11)
BUN: 8 mg/dL (ref 7–26)
CALCIUM: 9.1 mg/dL (ref 8.4–10.4)
CO2: 23 mmol/L (ref 22–29)
CREATININE: 0.81 mg/dL (ref 0.70–1.30)
Chloride: 108 mmol/L (ref 98–109)
GFR calc non Af Amer: >60 mL/min (ref >60)
Glucose, Bld: 67 mg/dL — ABNORMAL LOW (ref 70–140)
Potassium: 4.6 mmol/L (ref 3.5–5.1)
SODIUM: 136 mmol/L (ref 136–145)
Total Bilirubin: 0.3 mg/dL (ref 0.2–1.2)
Total Protein: 8.2 g/dL (ref 6.4–8.3)

## 2018-03-07 LAB — CBC WITH DIFFERENTIAL/PLATELET
BASOS PCT: 1 %
Basophils Absolute: 0 10*3/uL (ref 0.0–0.1)
Eosinophils Absolute: 0.1 10*3/uL (ref 0.0–0.5)
Eosinophils Relative: 2 %
HCT: 38.1 % — ABNORMAL LOW (ref 38.4–49.9)
HEMOGLOBIN: 12.3 g/dL — AB (ref 13.0–17.1)
Lymphocytes Relative: 18 %
Lymphs Abs: 1.3 10*3/uL (ref 0.9–3.3)
MCH: 28.8 pg (ref 27.2–33.4)
MCHC: 32.2 g/dL (ref 32.0–36.0)
MCV: 89.4 fL (ref 79.3–98.0)
Monocytes Absolute: 0.8 10*3/uL (ref 0.1–0.9)
Monocytes Relative: 12 %
NEUTROS PCT: 67 %
Neutro Abs: 4.6 10*3/uL (ref 1.5–6.5)
Platelets: 163 10*3/uL (ref 140–400)
RBC: 4.26 MIL/uL (ref 4.20–5.82)
RDW: 15.9 % — ABNORMAL HIGH (ref 11.0–14.6)
WBC: 6.9 10*3/uL (ref 4.0–10.3)

## 2018-03-07 LAB — MAGNESIUM: MAGNESIUM: 1.8 mg/dL (ref 1.7–2.4)

## 2018-03-07 MED ORDER — ALTEPLASE 2 MG IJ SOLR
2.0000 mg | Freq: Once | INTRAMUSCULAR | Status: AC | PRN
  Administered 2018-03-07: 2 mg
  Filled 2018-03-07: qty 2

## 2018-03-07 MED ORDER — POTASSIUM CHLORIDE CRYS ER 20 MEQ PO TBCR: 20.0000 meq | EXTENDED_RELEASE_TABLET | Freq: Every day | ORAL | 2 refills | Status: DC

## 2018-03-07 MED ORDER — PROCHLORPERAZINE MALEATE 10 MG PO TABS
ORAL_TABLET | ORAL | Status: AC
Start: 2018-03-07 — End: 2018-03-07
  Filled 2018-03-07: qty 1

## 2018-03-07 MED ORDER — RIVAROXABAN 15 MG PO TABS: 15.0000 mg | ORAL_TABLET | Freq: Two times a day (BID) | ORAL | 2 refills | Status: DC

## 2018-03-07 MED ORDER — SODIUM CHLORIDE 0.9 % IV SOLN
Freq: Once | INTRAVENOUS | Status: AC
  Administered 2018-03-07: 13:00:00 via INTRAVENOUS

## 2018-03-07 MED ORDER — SODIUM CHLORIDE 0.9 % IV SOLN
2400.0000 mg/m2 | INTRAVENOUS | Status: DC
  Administered 2018-03-07: 3950 mg via INTRAVENOUS
  Filled 2018-03-07: qty 79

## 2018-03-07 MED ORDER — ALTEPLASE 2 MG IJ SOLR
INTRAMUSCULAR | Status: AC
  Filled 2018-03-07: qty 2

## 2018-03-07 MED ORDER — PANITUMUMAB CHEMO INJECTION 100 MG/5ML
6.5000 mg/kg | Freq: Once | INTRAVENOUS | Status: AC
  Administered 2018-03-07: 400 mg via INTRAVENOUS
  Filled 2018-03-07: qty 20

## 2018-03-07 MED ORDER — GABAPENTIN 100 MG PO CAPS: 100.0000 mg | ORAL_CAPSULE | Freq: Three times a day (TID) | ORAL | 1 refills | Status: DC

## 2018-03-07 MED ORDER — LEUCOVORIN CALCIUM INJECTION 350 MG
400.0000 mg/m2 | Freq: Once | INTRAVENOUS | Status: AC
  Administered 2018-03-07: 660 mg via INTRAVENOUS
  Filled 2018-03-07: qty 33

## 2018-03-07 MED ORDER — PROCHLORPERAZINE MALEATE 10 MG PO TABS
10.0000 mg | ORAL_TABLET | Freq: Once | ORAL | Status: AC
  Administered 2018-03-07: 10 mg via ORAL

## 2018-03-07 MED FILL — GABAPENTIN 100 MG CAPSULE: 100 | 30 days supply | Qty: 90 | Fill #0

## 2018-03-07 MED FILL — POTASSIUM CL ER 20 MEQ TABL: 20 | 30 days supply | Qty: 30 | Fill #1

## 2018-03-07 MED FILL — XARELTO 15 MG TABLET: 15 | 15 days supply | Qty: 30 | Fill #0

## 2018-03-07 NOTE — Patient Instructions (Signed)
Return to [facility] @ 2:30 pm on Wednesday (03/09/2018) for pump removal.  Discharge Instructions for Patients Receiving Chemotherapy  Today you received the following chemotherapy agents:  Vectibix, Leucovorin, and 5FU.  To help prevent nausea and vomiting after your treatment, we encourage you to take your nausea medication as directed.   If you develop nausea and vomiting that is not controlled by your nausea medication, call the clinic.   BELOW ARE SYMPTOMS THAT SHOULD BE REPORTED IMMEDIATELY:  *FEVER GREATER THAN 100.5 F  *CHILLS WITH OR WITHOUT FEVER  NAUSEA AND VOMITING THAT IS NOT CONTROLLED WITH YOUR NAUSEA MEDICATION  *UNUSUAL SHORTNESS OF BREATH  *UNUSUAL BRUISING OR BLEEDING  TENDERNESS IN MOUTH AND THROAT WITH OR WITHOUT PRESENCE OF ULCERS  *URINARY PROBLEMS  *BOWEL PROBLEMS  UNUSUAL RASH Items with * indicate a potential emergency and should be followed up as soon as possible.  Feel free to call the clinic should you have any questions or concerns. The clinic phone number is [phone].  Please show the [place] at check-in to the Emergency Department and triage nurse.

## 2018-03-07 NOTE — Telephone Encounter (Signed)
Patient scheduled per 3/11 los. Patient will receive updated copy of schedule in treatment area.

## 2018-03-08 ENCOUNTER — Other Ambulatory Visit: Payer: Self-pay | Admitting: Nurse Practitioner

## 2018-03-08 ENCOUNTER — Telehealth: Payer: Self-pay | Admitting: *Deleted

## 2018-03-08 DIAGNOSIS — C189 Malignant neoplasm of colon, unspecified: Secondary | ICD-10-CM

## 2018-03-08 MED ORDER — OXYCODONE HCL 10 MG PO TABS: 10.0000 mg | ORAL_TABLET | ORAL | 0 refills | Status: DC | PRN

## 2018-03-08 MED FILL — oxyCODONE HCL 10 MG TABS: 10 | 11 days supply | Qty: 70 | Fill #0

## 2018-03-08 NOTE — Telephone Encounter (Signed)
Received call from daughter Jeff [name] requesting refill of Oxycodone for pt. Pt's    Phone      [phone].

## 2018-03-08 NOTE — Telephone Encounter (Signed)
Done. Thanks.

## 2018-03-09 ENCOUNTER — Inpatient Hospital Stay: Payer: Medicaid Other

## 2018-03-09 VITALS — BP 138/92 | HR 95 | Temp 98.0°F | Resp 18

## 2018-03-09 DIAGNOSIS — Z5112 Encounter for antineoplastic immunotherapy: Secondary | ICD-10-CM | POA: Diagnosis not present

## 2018-03-09 DIAGNOSIS — C189 Malignant neoplasm of colon, unspecified: Secondary | ICD-10-CM

## 2018-03-09 MED ORDER — HEPARIN SOD (PORK) LOCK FLUSH 100 UNIT/ML IV SOLN
500.0000 [IU] | Freq: Once | INTRAVENOUS | Status: AC | PRN
  Administered 2018-03-09: 500 [IU]
  Filled 2018-03-09: qty 5

## 2018-03-09 MED ORDER — SODIUM CHLORIDE 0.9% FLUSH
10.0000 mL | INTRAVENOUS | Status: DC | PRN
  Administered 2018-03-09: 10 mL
  Filled 2018-03-09: qty 10

## 2018-03-09 NOTE — Patient Instructions (Signed)

## 2018-03-09 NOTE — Progress Notes (Signed)
Pt here for pump disconnected.  BP   138/98  LA ;   138/92  RA.   Stated he ate salty foods this am breakfast and every morning -  Bacon, sausage.   Instructed pt to watch for salty foods, and to monitor for increased BP.   Instructed pt to notify his  PCP if BP continues to increase.  Spoke with daughter and informed of same info.  Both voiced understanding.

## 2018-03-21 ENCOUNTER — Inpatient Hospital Stay (HOSPITAL_BASED_OUTPATIENT_CLINIC_OR_DEPARTMENT_OTHER): Payer: Medicaid Other | Admitting: Nurse Practitioner

## 2018-03-21 ENCOUNTER — Inpatient Hospital Stay: Payer: Medicaid Other

## 2018-03-21 ENCOUNTER — Inpatient Hospital Stay: Payer: Medicaid Other | Admitting: Nutrition

## 2018-03-21 ENCOUNTER — Encounter: Payer: Self-pay | Admitting: Nurse Practitioner

## 2018-03-21 ENCOUNTER — Telehealth: Payer: Self-pay | Admitting: Nurse Practitioner

## 2018-03-21 VITALS — BP 122/81 | HR 99 | Temp 98.0°F | Resp 18 | Ht 69.0 in | Wt 130.5 lb

## 2018-03-21 DIAGNOSIS — C786 Secondary malignant neoplasm of retroperitoneum and peritoneum: Secondary | ICD-10-CM | POA: Diagnosis not present

## 2018-03-21 DIAGNOSIS — Z79899 Other long term (current) drug therapy: Secondary | ICD-10-CM

## 2018-03-21 DIAGNOSIS — R630 Anorexia: Secondary | ICD-10-CM

## 2018-03-21 DIAGNOSIS — C186 Malignant neoplasm of descending colon: Secondary | ICD-10-CM

## 2018-03-21 DIAGNOSIS — E876 Hypokalemia: Secondary | ICD-10-CM | POA: Diagnosis not present

## 2018-03-21 DIAGNOSIS — Z933 Colostomy status: Secondary | ICD-10-CM

## 2018-03-21 DIAGNOSIS — M79606 Pain in leg, unspecified: Secondary | ICD-10-CM

## 2018-03-21 DIAGNOSIS — C189 Malignant neoplasm of colon, unspecified: Secondary | ICD-10-CM

## 2018-03-21 DIAGNOSIS — C762 Malignant neoplasm of abdomen: Secondary | ICD-10-CM

## 2018-03-21 DIAGNOSIS — D630 Anemia in neoplastic disease: Secondary | ICD-10-CM | POA: Diagnosis not present

## 2018-03-21 DIAGNOSIS — Z95828 Presence of other vascular implants and grafts: Secondary | ICD-10-CM

## 2018-03-21 DIAGNOSIS — Z5112 Encounter for antineoplastic immunotherapy: Secondary | ICD-10-CM | POA: Diagnosis not present

## 2018-03-21 DIAGNOSIS — G893 Neoplasm related pain (acute) (chronic): Secondary | ICD-10-CM

## 2018-03-21 LAB — CBC WITH DIFFERENTIAL/PLATELET
Basophils Absolute: 0.1 10*3/uL (ref 0.0–0.1)
Basophils Relative: 1 %
EOS PCT: 2 %
Eosinophils Absolute: 0.1 10*3/uL (ref 0.0–0.5)
HEMATOCRIT: 37.2 % — AB (ref 38.4–49.9)
HEMOGLOBIN: 11.8 g/dL — AB (ref 13.0–17.1)
LYMPHS ABS: 1.7 10*3/uL (ref 0.9–3.3)
Lymphocytes Relative: 22 %
MCH: 28.5 pg (ref 27.2–33.4)
MCHC: 31.7 g/dL — ABNORMAL LOW (ref 32.0–36.0)
MCV: 89.9 fL (ref 79.3–98.0)
Monocytes Absolute: 0.7 10*3/uL (ref 0.1–0.9)
Monocytes Relative: 9 %
Neutro Abs: 5.1 10*3/uL (ref 1.5–6.5)
Neutrophils Relative %: 66 %
Platelets: 166 10*3/uL (ref 140–400)
RBC: 4.14 MIL/uL — AB (ref 4.20–5.82)
RDW: 14.8 % — ABNORMAL HIGH (ref 11.0–14.6)
WBC: 7.7 10*3/uL (ref 4.0–10.3)

## 2018-03-21 LAB — COMPREHENSIVE METABOLIC PANEL
ALBUMIN: 3.4 g/dL — AB (ref 3.5–5.0)
ALT: 7 U/L (ref 0–55)
AST: 22 U/L (ref 5–34)
Alkaline Phosphatase: 97 U/L (ref 40–150)
Anion gap: 11 (ref 3–11)
BUN: 9 mg/dL (ref 7–26)
CHLORIDE: 106 mmol/L (ref 98–109)
CO2: 21 mmol/L — ABNORMAL LOW (ref 22–29)
Calcium: 9.3 mg/dL (ref 8.4–10.4)
Creatinine, Ser: 0.92 mg/dL (ref 0.70–1.30)
GFR calc Af Amer: >60 mL/min (ref >60)
Glucose, Bld: 116 mg/dL (ref 70–140)
POTASSIUM: 3.2 mmol/L — AB (ref 3.5–5.1)
Sodium: 138 mmol/L (ref 136–145)
Total Bilirubin: 0.3 mg/dL (ref 0.2–1.2)
Total Protein: 8.1 g/dL (ref 6.4–8.3)

## 2018-03-21 LAB — IRON AND TIBC
IRON: 60 ug/dL (ref 42–163)
Saturation Ratios: 20 % — ABNORMAL LOW (ref 42–163)
TIBC: 296 ug/dL (ref 202–409)
UIBC: 237 ug/dL

## 2018-03-21 LAB — FERRITIN: FERRITIN: 101 ng/mL (ref 22–316)

## 2018-03-21 LAB — CEA (IN HOUSE-CHCC): CEA (CHCC-IN HOUSE): 9.87 ng/mL — AB (ref 0.00–5.00)

## 2018-03-21 LAB — MAGNESIUM: Magnesium: 1.9 mg/dL (ref 1.5–2.5)

## 2018-03-21 MED ORDER — SODIUM CHLORIDE 0.9 % IV SOLN
Freq: Once | INTRAVENOUS | Status: AC
  Administered 2018-03-21: 10:00:00 via INTRAVENOUS

## 2018-03-21 MED ORDER — LEUCOVORIN CALCIUM INJECTION 350 MG
400.0000 mg/m2 | Freq: Once | INTRAVENOUS | Status: AC
  Administered 2018-03-21: 660 mg via INTRAVENOUS
  Filled 2018-03-21: qty 33

## 2018-03-21 MED ORDER — PROCHLORPERAZINE MALEATE 10 MG PO TABS: 10.0000 mg | ORAL_TABLET | Freq: Once | ORAL | Status: DC

## 2018-03-21 MED ORDER — SODIUM CHLORIDE 0.9 % IV SOLN
6.5000 mg/kg | Freq: Once | INTRAVENOUS | Status: AC
  Administered 2018-03-21: 400 mg via INTRAVENOUS
  Filled 2018-03-21: qty 20

## 2018-03-21 MED ORDER — SODIUM CHLORIDE 0.9% FLUSH
10.0000 mL | INTRAVENOUS | Status: DC | PRN
  Administered 2018-03-21: 10 mL via INTRAVENOUS
  Filled 2018-03-21: qty 10

## 2018-03-21 MED ORDER — PROCHLORPERAZINE MALEATE 10 MG PO TABS
ORAL_TABLET | ORAL | Status: AC
  Filled 2018-03-21: qty 1

## 2018-03-21 MED ORDER — SODIUM CHLORIDE 0.9 % IV SOLN
2400.0000 mg/m2 | INTRAVENOUS | Status: DC
  Administered 2018-03-21: 3950 mg via INTRAVENOUS
  Filled 2018-03-21: qty 79

## 2018-03-21 MED ORDER — OXYCODONE HCL 10 MG PO TABS: 10.0000 mg | ORAL_TABLET | ORAL | 0 refills | Status: DC | PRN

## 2018-03-21 MED FILL — oxyCODONE HCL 10 MG TABS: 10 | 11 days supply | Qty: 70 | Fill #0

## 2018-03-21 NOTE — Patient Instructions (Signed)
[facility] Discharge Instructions for Patients Receiving Chemotherapy  Today you received the following chemotherapy agents Leucovorin and 5FU  To help prevent nausea and vomiting after your treatment, we encourage you to take your nausea medication as directed If you develop nausea and vomiting that is not controlled by your nausea medication, call the clinic.   BELOW ARE SYMPTOMS THAT SHOULD BE REPORTED IMMEDIATELY:  *FEVER GREATER THAN 100.5 F  *CHILLS WITH OR WITHOUT FEVER  NAUSEA AND VOMITING THAT IS NOT CONTROLLED WITH YOUR NAUSEA MEDICATION  *UNUSUAL SHORTNESS OF BREATH  *UNUSUAL BRUISING OR BLEEDING  TENDERNESS IN MOUTH AND THROAT WITH OR WITHOUT PRESENCE OF ULCERS  *URINARY PROBLEMS  *BOWEL PROBLEMS  UNUSUAL RASH Items with * indicate a potential emergency and should be followed up as soon as possible.  Feel free to call the clinic should you have any questions or concerns. The clinic phone number is [phone].  Please show the CHEMO ALERT CARD at check-in to the Emergency Department and triage nurse.   

## 2018-03-21 NOTE — Progress Notes (Signed)
Nutrition follow-up completed with patient during infusion for metastatic colon cancer. Weight decreased and documented as 130.5 pounds March 25 decreased from 135 pounds March 11. Patient continues to deny nutrition impact symptoms. Patient is asking for additional Ensure Plus. He denies questions or concerns.  Nutrition diagnosis: Inadequate oral intake continues.  Intervention: Provided an additional case of Ensure Plus.  Also gave patient some coupons for purchasing oral nutrition supplements. Recommended increasing calories and protein small frequent meals and snacks.  Monitoring evaluation goals: Patient will tolerate increased calories and protein to minimize weight loss.  Next visit: Monday, April 22 during infusion.  **Disclaimer: This note was dictated with voice recognition software. Similar sounding words can inadvertently be transcribed and this note may contain transcription errors which may not have been corrected upon publication of note.**

## 2018-03-21 NOTE — Telephone Encounter (Signed)
No 3/25 los - per Lacie keep appts as scheduled.
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
## 2018-03-23 ENCOUNTER — Inpatient Hospital Stay: Payer: Medicaid Other

## 2018-03-23 VITALS — BP 128/78 | HR 88 | Temp 98.6°F | Resp 17

## 2018-03-23 DIAGNOSIS — C189 Malignant neoplasm of colon, unspecified: Secondary | ICD-10-CM

## 2018-03-23 DIAGNOSIS — Z5112 Encounter for antineoplastic immunotherapy: Secondary | ICD-10-CM | POA: Diagnosis not present

## 2018-03-23 MED ORDER — HEPARIN SOD (PORK) LOCK FLUSH 100 UNIT/ML IV SOLN
500.0000 [IU] | Freq: Once | INTRAVENOUS | Status: AC | PRN
  Administered 2018-03-23: 500 [IU]
  Filled 2018-03-23: qty 5

## 2018-03-23 MED ORDER — SODIUM CHLORIDE 0.9% FLUSH
10.0000 mL | INTRAVENOUS | Status: DC | PRN
  Administered 2018-03-23: 10 mL
  Filled 2018-03-23: qty 10

## 2018-03-31 ENCOUNTER — Ambulatory Visit (HOSPITAL_COMMUNITY)
Admission: RE | Admit: 2018-03-31 | Discharge: 2018-03-31 | Disposition: A | Discharge status: Home / Self Care | Payer: Medicaid Other | Source: Ambulatory Visit | Attending: Hematology | Admitting: Hematology

## 2018-03-31 DIAGNOSIS — M799 Soft tissue disorder, unspecified: Secondary | ICD-10-CM | POA: Diagnosis not present

## 2018-03-31 DIAGNOSIS — D739 Disease of spleen, unspecified: Secondary | ICD-10-CM | POA: Insufficient documentation

## 2018-03-31 DIAGNOSIS — C189 Malignant neoplasm of colon, unspecified: Secondary | ICD-10-CM | POA: Diagnosis not present

## 2018-03-31 MED ORDER — IOPAMIDOL (ISOVUE-300) INJECTION 61%
100.0000 mL | Freq: Once | INTRAVENOUS | Status: AC | PRN
  Administered 2018-03-31: 100 mL via INTRAVENOUS

## 2018-03-31 MED ORDER — IOPAMIDOL (ISOVUE-300) INJECTION 61%
INTRAVENOUS | Status: AC
  Filled 2018-03-31: qty 100

## 2018-04-04 ENCOUNTER — Telehealth: Payer: Self-pay | Admitting: Nurse Practitioner

## 2018-04-04 ENCOUNTER — Inpatient Hospital Stay (HOSPITAL_BASED_OUTPATIENT_CLINIC_OR_DEPARTMENT_OTHER): Payer: Medicaid Other | Admitting: Nurse Practitioner

## 2018-04-04 ENCOUNTER — Other Ambulatory Visit: Payer: Self-pay | Admitting: *Deleted

## 2018-04-04 ENCOUNTER — Inpatient Hospital Stay: Payer: Medicaid Other

## 2018-04-04 ENCOUNTER — Inpatient Hospital Stay: Payer: Medicaid Other | Attending: Hematology

## 2018-04-04 ENCOUNTER — Encounter (INDEPENDENT_AMBULATORY_CARE_PROVIDER_SITE_OTHER): Payer: Self-pay

## 2018-04-04 ENCOUNTER — Encounter: Payer: Self-pay | Admitting: Nurse Practitioner

## 2018-04-04 VITALS — BP 118/76 | HR 90 | Temp 98.5°F | Resp 17 | Ht 69.0 in | Wt 134.0 lb

## 2018-04-04 DIAGNOSIS — M79606 Pain in leg, unspecified: Secondary | ICD-10-CM | POA: Insufficient documentation

## 2018-04-04 DIAGNOSIS — Z86718 Personal history of other venous thrombosis and embolism: Secondary | ICD-10-CM | POA: Diagnosis not present

## 2018-04-04 DIAGNOSIS — G893 Neoplasm related pain (acute) (chronic): Secondary | ICD-10-CM | POA: Insufficient documentation

## 2018-04-04 DIAGNOSIS — Z95828 Presence of other vascular implants and grafts: Secondary | ICD-10-CM

## 2018-04-04 DIAGNOSIS — Z7901 Long term (current) use of anticoagulants: Secondary | ICD-10-CM

## 2018-04-04 DIAGNOSIS — Z933 Colostomy status: Secondary | ICD-10-CM

## 2018-04-04 DIAGNOSIS — E876 Hypokalemia: Secondary | ICD-10-CM

## 2018-04-04 DIAGNOSIS — C189 Malignant neoplasm of colon, unspecified: Secondary | ICD-10-CM

## 2018-04-04 DIAGNOSIS — C186 Malignant neoplasm of descending colon: Secondary | ICD-10-CM

## 2018-04-04 DIAGNOSIS — Z79899 Other long term (current) drug therapy: Secondary | ICD-10-CM

## 2018-04-04 DIAGNOSIS — C762 Malignant neoplasm of abdomen: Secondary | ICD-10-CM

## 2018-04-04 DIAGNOSIS — C786 Secondary malignant neoplasm of retroperitoneum and peritoneum: Secondary | ICD-10-CM | POA: Diagnosis not present

## 2018-04-04 DIAGNOSIS — Z5112 Encounter for antineoplastic immunotherapy: Secondary | ICD-10-CM | POA: Diagnosis present

## 2018-04-04 DIAGNOSIS — K922 Gastrointestinal hemorrhage, unspecified: Secondary | ICD-10-CM | POA: Insufficient documentation

## 2018-04-04 DIAGNOSIS — D630 Anemia in neoplastic disease: Secondary | ICD-10-CM

## 2018-04-04 DIAGNOSIS — R630 Anorexia: Secondary | ICD-10-CM | POA: Insufficient documentation

## 2018-04-04 DIAGNOSIS — L270 Generalized skin eruption due to drugs and medicaments taken internally: Secondary | ICD-10-CM | POA: Diagnosis not present

## 2018-04-04 DIAGNOSIS — D500 Iron deficiency anemia secondary to blood loss (chronic): Secondary | ICD-10-CM

## 2018-04-04 LAB — CBC WITH DIFFERENTIAL/PLATELET
BASOS PCT: 1 %
Basophils Absolute: 0 10*3/uL (ref 0.0–0.1)
EOS ABS: 0.2 10*3/uL (ref 0.0–0.5)
EOS PCT: 3 %
HCT: 36.2 % — ABNORMAL LOW (ref 38.4–49.9)
Hemoglobin: 11.6 g/dL — ABNORMAL LOW (ref 13.0–17.1)
LYMPHS ABS: 1.7 10*3/uL (ref 0.9–3.3)
Lymphocytes Relative: 22 %
MCH: 28.6 pg (ref 27.2–33.4)
MCHC: 32 g/dL (ref 32.0–36.0)
MCV: 89.4 fL (ref 79.3–98.0)
MONOS PCT: 6 %
Monocytes Absolute: 0.5 10*3/uL (ref 0.1–0.9)
Neutro Abs: 5.3 10*3/uL (ref 1.5–6.5)
Neutrophils Relative %: 68 %
PLATELETS: 142 10*3/uL (ref 140–400)
RBC: 4.05 MIL/uL — ABNORMAL LOW (ref 4.20–5.82)
RDW: 15.1 % — ABNORMAL HIGH (ref 11.0–14.6)
WBC: 7.8 10*3/uL (ref 4.0–10.3)

## 2018-04-04 LAB — COMPREHENSIVE METABOLIC PANEL
ALK PHOS: 86 U/L (ref 40–150)
ALT: 9 U/L (ref 0–55)
AST: 21 U/L (ref 5–34)
Albumin: 3.4 g/dL — ABNORMAL LOW (ref 3.5–5.0)
Anion gap: 8 (ref 3–11)
BUN: 8 mg/dL (ref 7–26)
CALCIUM: 9 mg/dL (ref 8.4–10.4)
CHLORIDE: 105 mmol/L (ref 98–109)
CO2: 24 mmol/L (ref 22–29)
CREATININE: 0.83 mg/dL (ref 0.70–1.30)
GFR calc Af Amer: >60 mL/min (ref >60)
GFR calc non Af Amer: >60 mL/min (ref >60)
Glucose, Bld: 134 mg/dL (ref 70–140)
Potassium: 3.7 mmol/L (ref 3.5–5.1)
Sodium: 137 mmol/L (ref 136–145)
Total Bilirubin: 0.3 mg/dL (ref 0.2–1.2)
Total Protein: 7.8 g/dL (ref 6.4–8.3)

## 2018-04-04 LAB — MAGNESIUM: MAGNESIUM: 1.3 mg/dL — AB (ref 1.5–2.5)

## 2018-04-04 MED ORDER — PROCHLORPERAZINE MALEATE 10 MG PO TABS: 10.0000 mg | ORAL_TABLET | Freq: Once | ORAL | Status: DC

## 2018-04-04 MED ORDER — LEUCOVORIN CALCIUM INJECTION 350 MG
400.0000 mg/m2 | Freq: Once | INTRAVENOUS | Status: AC
  Administered 2018-04-04: 660 mg via INTRAVENOUS
  Filled 2018-04-04: qty 33

## 2018-04-04 MED ORDER — SODIUM CHLORIDE 0.9 % IV SOLN
Freq: Once | INTRAVENOUS | Status: AC
  Administered 2018-04-04: 12:00:00 via INTRAVENOUS

## 2018-04-04 MED ORDER — MAGNESIUM OXIDE 400 (241.3 MG) MG PO TABS: 400.0000 mg | ORAL_TABLET | Freq: Every day | ORAL | 0 refills | Status: DC

## 2018-04-04 MED ORDER — OXYCODONE HCL 10 MG PO TABS: 10.0000 mg | ORAL_TABLET | ORAL | 0 refills | Status: DC | PRN

## 2018-04-04 MED ORDER — SODIUM CHLORIDE 0.9% FLUSH
10.0000 mL | INTRAVENOUS | Status: DC | PRN
  Administered 2018-04-04: 10 mL via INTRAVENOUS
  Filled 2018-04-04: qty 10

## 2018-04-04 MED ORDER — DIPHENOXYLATE-ATROPINE 2.5-0.025 MG PO TABS: 2.0000 | ORAL_TABLET | Freq: Four times a day (QID) | ORAL | 0 refills | Status: DC | PRN

## 2018-04-04 MED ORDER — SODIUM CHLORIDE 0.9 % IV SOLN
6.5000 mg/kg | Freq: Once | INTRAVENOUS | Status: AC
  Administered 2018-04-04: 400 mg via INTRAVENOUS
  Filled 2018-04-04: qty 20

## 2018-04-04 MED ORDER — SODIUM CHLORIDE 0.9 % IV SOLN
2400.0000 mg/m2 | INTRAVENOUS | Status: DC
  Administered 2018-04-04: 3950 mg via INTRAVENOUS
  Filled 2018-04-04: qty 79

## 2018-04-04 MED FILL — DIPHENOXYLATE-ATROP 2.5-0.0: 2.5-0.025 | 7 days supply | Qty: 60 | Fill #0

## 2018-04-04 MED FILL — oxyCODONE HCL 10 MG TABS: 10 | 11 days supply | Qty: 70 | Fill #0

## 2018-04-04 NOTE — Telephone Encounter (Signed)
Scheduled appt per 4/8 los - Gave patient aVS and calender per los.  

## 2018-04-04 NOTE — Progress Notes (Signed)
Per patient, patient does not take any premeds prior to chemotherapy. Compazine refused this visit.

## 2018-04-04 NOTE — Patient Instructions (Signed)
[facility] Discharge Instructions for Patients Receiving Chemotherapy  Today you received the following chemotherapy agents Vectibix, Leucovorin, Adrucil.   To help prevent nausea and vomiting after your treatment, we encourage you to take your nausea medication as directed   If you develop nausea and vomiting that is not controlled by your nausea medication, call the clinic.   BELOW ARE SYMPTOMS THAT SHOULD BE REPORTED IMMEDIATELY:  *FEVER GREATER THAN 100.5 F  *CHILLS WITH OR WITHOUT FEVER  NAUSEA AND VOMITING THAT IS NOT CONTROLLED WITH YOUR NAUSEA MEDICATION  *UNUSUAL SHORTNESS OF BREATH  *UNUSUAL BRUISING OR BLEEDING  TENDERNESS IN MOUTH AND THROAT WITH OR WITHOUT PRESENCE OF ULCERS  *URINARY PROBLEMS  *BOWEL PROBLEMS  UNUSUAL RASH Items with * indicate a potential emergency and should be followed up as soon as possible.  Feel free to call the clinic should you have any questions or concerns. The clinic phone number is [phone].  Please show the [facility] at check-in to the Emergency Department and triage nurse.
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
## 2018-04-06 ENCOUNTER — Inpatient Hospital Stay: Payer: Medicaid Other

## 2018-04-06 VITALS — BP 88/56 | HR 99 | Temp 98.4°F | Resp 18

## 2018-04-06 DIAGNOSIS — Z5112 Encounter for antineoplastic immunotherapy: Secondary | ICD-10-CM | POA: Diagnosis not present

## 2018-04-06 DIAGNOSIS — C189 Malignant neoplasm of colon, unspecified: Secondary | ICD-10-CM

## 2018-04-06 MED ORDER — SODIUM CHLORIDE 0.9% FLUSH
10.0000 mL | INTRAVENOUS | Status: DC | PRN
  Administered 2018-04-06: 10 mL
  Filled 2018-04-06: qty 10

## 2018-04-06 MED ORDER — HEPARIN SOD (PORK) LOCK FLUSH 100 UNIT/ML IV SOLN
500.0000 [IU] | Freq: Once | INTRAVENOUS | Status: AC | PRN
  Administered 2018-04-06: 500 [IU]
  Filled 2018-04-06: qty 5
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
## 2018-04-18 ENCOUNTER — Inpatient Hospital Stay: Payer: Medicaid Other | Admitting: Nutrition

## 2018-04-18 ENCOUNTER — Inpatient Hospital Stay: Payer: Medicaid Other

## 2018-04-18 ENCOUNTER — Telehealth: Payer: Self-pay | Admitting: Hematology

## 2018-04-18 ENCOUNTER — Other Ambulatory Visit: Payer: Self-pay

## 2018-04-18 ENCOUNTER — Inpatient Hospital Stay (HOSPITAL_BASED_OUTPATIENT_CLINIC_OR_DEPARTMENT_OTHER): Payer: Medicaid Other | Admitting: Nurse Practitioner

## 2018-04-18 ENCOUNTER — Other Ambulatory Visit: Payer: Self-pay | Admitting: Nurse Practitioner

## 2018-04-18 ENCOUNTER — Encounter: Payer: Self-pay | Admitting: Nurse Practitioner

## 2018-04-18 VITALS — BP 107/76 | HR 73 | Temp 97.9°F | Resp 17 | Ht 69.0 in | Wt 127.2 lb

## 2018-04-18 DIAGNOSIS — C186 Malignant neoplasm of descending colon: Secondary | ICD-10-CM

## 2018-04-18 DIAGNOSIS — R197 Diarrhea, unspecified: Secondary | ICD-10-CM

## 2018-04-18 DIAGNOSIS — G893 Neoplasm related pain (acute) (chronic): Secondary | ICD-10-CM

## 2018-04-18 DIAGNOSIS — C786 Secondary malignant neoplasm of retroperitoneum and peritoneum: Secondary | ICD-10-CM

## 2018-04-18 DIAGNOSIS — Z5112 Encounter for antineoplastic immunotherapy: Secondary | ICD-10-CM | POA: Diagnosis not present

## 2018-04-18 DIAGNOSIS — Z79899 Other long term (current) drug therapy: Secondary | ICD-10-CM

## 2018-04-18 DIAGNOSIS — C189 Malignant neoplasm of colon, unspecified: Secondary | ICD-10-CM

## 2018-04-18 DIAGNOSIS — Z933 Colostomy status: Secondary | ICD-10-CM | POA: Diagnosis not present

## 2018-04-18 DIAGNOSIS — E876 Hypokalemia: Secondary | ICD-10-CM | POA: Diagnosis not present

## 2018-04-18 DIAGNOSIS — R630 Anorexia: Secondary | ICD-10-CM

## 2018-04-18 DIAGNOSIS — D630 Anemia in neoplastic disease: Secondary | ICD-10-CM

## 2018-04-18 DIAGNOSIS — C762 Malignant neoplasm of abdomen: Secondary | ICD-10-CM

## 2018-04-18 DIAGNOSIS — M79606 Pain in leg, unspecified: Secondary | ICD-10-CM | POA: Diagnosis not present

## 2018-04-18 LAB — COMPREHENSIVE METABOLIC PANEL
ALBUMIN: 3.9 g/dL (ref 3.5–5.0)
ALT: 7 U/L (ref 0–55)
AST: 22 U/L (ref 5–34)
Alkaline Phosphatase: 77 U/L (ref 40–150)
Anion gap: 11 (ref 3–11)
BUN: 9 mg/dL (ref 7–26)
CHLORIDE: 103 mmol/L (ref 98–109)
CO2: 21 mmol/L — AB (ref 22–29)
CREATININE: 1.04 mg/dL (ref 0.70–1.30)
Calcium: 9.6 mg/dL (ref 8.4–10.4)
GFR calc non Af Amer: >60 mL/min (ref >60)
GLUCOSE: 88 mg/dL (ref 70–140)
Potassium: 4 mmol/L (ref 3.5–5.1)
SODIUM: 135 mmol/L — AB (ref 136–145)
Total Bilirubin: 0.6 mg/dL (ref 0.2–1.2)
Total Protein: 8.7 g/dL — ABNORMAL HIGH (ref 6.4–8.3)

## 2018-04-18 LAB — MAGNESIUM: Magnesium: 1.1 mg/dL — CL (ref 1.7–2.4)

## 2018-04-18 LAB — CBC WITH DIFFERENTIAL/PLATELET
Basophils Absolute: 0.1 10*3/uL (ref 0.0–0.1)
Basophils Relative: 1 %
EOS ABS: 0.2 10*3/uL (ref 0.0–0.5)
Eosinophils Relative: 2 %
HEMATOCRIT: 40.2 % (ref 38.4–49.9)
HEMOGLOBIN: 13.1 g/dL (ref 13.0–17.1)
LYMPHS ABS: 1.5 10*3/uL (ref 0.9–3.3)
Lymphocytes Relative: 18 %
MCH: 29.1 pg (ref 27.2–33.4)
MCHC: 32.6 g/dL (ref 32.0–36.0)
MCV: 89.2 fL (ref 79.3–98.0)
MONO ABS: 0.7 10*3/uL (ref 0.1–0.9)
MONOS PCT: 9 %
Neutro Abs: 5.7 10*3/uL (ref 1.5–6.5)
Neutrophils Relative %: 70 %
Platelets: 139 10*3/uL — ABNORMAL LOW (ref 140–400)
RBC: 4.51 MIL/uL (ref 4.20–5.82)
RDW: 15.8 % — ABNORMAL HIGH (ref 11.0–14.6)
WBC: 8.2 10*3/uL (ref 4.0–10.3)

## 2018-04-18 LAB — CEA (IN HOUSE-CHCC): CEA (CHCC-In House): 13.41 ng/mL — ABNORMAL HIGH (ref 0.00–5.00)

## 2018-04-18 LAB — FERRITIN: FERRITIN: 121 ng/mL (ref 22–316)

## 2018-04-18 MED ORDER — DEXAMETHASONE SODIUM PHOSPHATE 10 MG/ML IJ SOLN
INTRAMUSCULAR | Status: AC
  Filled 2018-04-18: qty 1

## 2018-04-18 MED ORDER — ONDANSETRON HCL 8 MG PO TABS: 8.0000 mg | ORAL_TABLET | Freq: Two times a day (BID) | ORAL | 3 refills | Status: DC | PRN

## 2018-04-18 MED ORDER — ATROPINE SULFATE 1 MG/ML IJ SOLN
INTRAMUSCULAR | Status: AC
Start: 2018-04-18 — End: 2018-04-18
  Filled 2018-04-18: qty 1

## 2018-04-18 MED ORDER — PALONOSETRON HCL INJECTION 0.25 MG/5ML
0.2500 mg | Freq: Once | INTRAVENOUS | Status: AC
  Administered 2018-04-18: 0.25 mg via INTRAVENOUS

## 2018-04-18 MED ORDER — ATROPINE SULFATE 1 MG/ML IJ SOLN
0.5000 mg | Freq: Once | INTRAMUSCULAR | Status: AC | PRN
  Administered 2018-04-18: 0.5 mg via INTRAVENOUS

## 2018-04-18 MED ORDER — PROCHLORPERAZINE MALEATE 10 MG PO TABS: 10.0000 mg | ORAL_TABLET | Freq: Four times a day (QID) | ORAL | 3 refills | Status: DC | PRN

## 2018-04-18 MED ORDER — SODIUM CHLORIDE 0.9 % IJ SOLN
10.0000 mL | Freq: Once | INTRAMUSCULAR | Status: AC
  Administered 2018-04-18: 10 mL via INTRAVENOUS
  Filled 2018-04-18: qty 10

## 2018-04-18 MED ORDER — DEXAMETHASONE SODIUM PHOSPHATE 10 MG/ML IJ SOLN
10.0000 mg | Freq: Once | INTRAMUSCULAR | Status: AC
  Administered 2018-04-18: 10 mg via INTRAVENOUS

## 2018-04-18 MED ORDER — LEUCOVORIN CALCIUM INJECTION 350 MG
400.0000 mg/m2 | Freq: Once | INTRAVENOUS | Status: AC
  Administered 2018-04-18: 660 mg via INTRAVENOUS
  Filled 2018-04-18: qty 33

## 2018-04-18 MED ORDER — SODIUM CHLORIDE 0.9 % IV SOLN
Freq: Once | INTRAVENOUS | Status: AC
  Administered 2018-04-18: 12:00:00 via INTRAVENOUS

## 2018-04-18 MED ORDER — IRINOTECAN HCL CHEMO INJECTION 100 MG/5ML
120.0000 mg/m2 | Freq: Once | INTRAVENOUS | Status: AC
  Administered 2018-04-18: 200 mg via INTRAVENOUS
  Filled 2018-04-18: qty 10

## 2018-04-18 MED ORDER — PALONOSETRON HCL INJECTION 0.25 MG/5ML
INTRAVENOUS | Status: AC
  Filled 2018-04-18: qty 5

## 2018-04-18 MED ORDER — SODIUM CHLORIDE 0.9 % IV SOLN
2.0000 g | Freq: Once | INTRAVENOUS | Status: AC
  Administered 2018-04-18: 2 g via INTRAVENOUS
  Filled 2018-04-18: qty 4

## 2018-04-18 MED ORDER — SODIUM CHLORIDE 0.9 % IV SOLN
2200.0000 mg/m2 | INTRAVENOUS | Status: DC
  Administered 2018-04-18: 3650 mg via INTRAVENOUS
  Filled 2018-04-18: qty 73

## 2018-04-18 MED ORDER — OXYCODONE HCL 10 MG PO TABS: 10.0000 mg | ORAL_TABLET | ORAL | 0 refills | Status: DC | PRN

## 2018-04-18 MED FILL — ONDANSETRON HCL 8 MG TABLET: 8 | 15 days supply | Qty: 30 | Fill #0

## 2018-04-18 MED FILL — oxyCODONE HCL 10 MG TABS: 10 | 11 days supply | Qty: 70 | Fill #0

## 2018-04-18 MED FILL — PROCHLORPERAZINE 5 MG TAB: 5 | 7 days supply | Qty: 60 | Fill #0

## 2018-04-18 NOTE — Progress Notes (Signed)
Brief nutrition follow-up completed with patient during infusion for metastatic colon cancer. Weight decreased was documented as 127 pounds on April 22 down from 134 pounds April 8. Patient reports this is because of loose stools through his colostomy. He reports he always gains his weight back. He is requesting additional Ensure Plus.  Nutrition diagnosis: Inadequate oral intake continues.  Intervention: Provided 1 complementary case of Ensure Plus.   Encourage patient to continue to follow strategies for improved consistency of colostomy output.  Monitoring, evaluation, goals: Patient will work to increase calories and protein to minimize further weight loss.  Next visit: To be scheduled as needed.  **Disclaimer: This note was dictated with voice recognition software. Similar sounding words can inadvertently be transcribed and this note may contain transcription errors which may not have been corrected upon publication of note.**

## 2018-04-18 NOTE — Progress Notes (Signed)
04/18/18 @ 1220  Verified Fluorouracil CIV dose at 2200 mg/m2 over 46 hours with Dr Burr Medico.  T.O. Dr Lavonda Jumbo, PharmD

## 2018-04-18 NOTE — Telephone Encounter (Signed)
Appointments scheduled patient to get new schedule from infusion / per 4/22 los

## 2018-04-18 NOTE — Patient Instructions (Signed)

## 2018-04-18 NOTE — Patient Instructions (Signed)
[facility] Discharge Instructions for Patients Receiving Chemotherapy  Today you received the following chemotherapy agents: Irinotecan, Leucovorin, and 5FU.  To help prevent nausea and vomiting after your treatment, we encourage you to take your nausea medication as directed.   If you develop nausea and vomiting that is not controlled by your nausea medication, call the clinic.   BELOW ARE SYMPTOMS THAT SHOULD BE REPORTED IMMEDIATELY:  *FEVER GREATER THAN 100.5 F  *CHILLS WITH OR WITHOUT FEVER  NAUSEA AND VOMITING THAT IS NOT CONTROLLED WITH YOUR NAUSEA MEDICATION  *UNUSUAL SHORTNESS OF BREATH  *UNUSUAL BRUISING OR BLEEDING  TENDERNESS IN MOUTH AND THROAT WITH OR WITHOUT PRESENCE OF ULCERS  *URINARY PROBLEMS  *BOWEL PROBLEMS  UNUSUAL RASH Items with * indicate a potential emergency and should be followed up as soon as possible.  Feel free to call the clinic should you have any questions or concerns. The clinic phone number is [phone].  Please show the [facility] at check-in to the Emergency Department and triage nurse.  Hypomagnesemia Hypomagnesemia is a condition in which the level of magnesium in the blood is low. Magnesium is a mineral that is found in many foods. It is used in many different processes in the body. Hypomagnesemia can affect every organ in the body. It can cause life-threatening problems. What are the causes? Causes of hypomagnesemia include:  Not getting enough magnesium in your diet.  Malnutrition.  Problems with absorbing magnesium from the intestines.  Dehydration.  Alcohol abuse.  Vomiting.  Severe diarrhea.  Some medicines, including medicines that make you urinate more.  Certain diseases, such as kidney disease, diabetes, and overactive thyroid.  What are the signs or symptoms?  Involuntary shaking or trembling of a body part (tremor).  Confusion.  Muscle weakness.  Sensitivity to light, sound, and  touch.  Psychiatric issues, such as depression, irritability, or psychosis.  Sudden tightening of muscles (muscle spasms).  Tingling in the arms and legs.  A feeling of fluttering of the heart. These symptoms are more severe if magnesium levels drop suddenly. How is this diagnosed? To make a diagnosis, your health care provider will do a physical exam and order blood and urine tests. How is this treated? Treatment will depend on the cause and the severity of your condition. It may involve:  A magnesium supplement. This can be taken in pill form. It can also be given through an IV tube. This is usually done if the condition is severe.  Changes to your diet. You may be directed to eat foods that have a lot of magnesium, such as green leafy vegetables, peas, beans, and nuts.  Eliminating alcohol from your diet.  Follow these instructions at home:  Include foods with magnesium in your diet. Foods that are rich in magnesium include green vegetables, beans, nuts and seeds, and whole grains.  Take medicines only as directed by your health care provider.  Take magnesium supplements if your health care provider instructs you to do that. Take them as directed.  Have your magnesium levels monitored as directed by your health care provider.  When you are active, drink fluids that contain electrolytes.  Keep all follow-up visits as directed by your health care provider. This is important. Contact a health care provider if:  You get worse instead of better.  Your symptoms return. Get help right away if:  Your symptoms are severe. This information is not intended to replace advice given to you by your health care  provider. Make sure you discuss any questions you have with your health care provider. Document Released: 09/09/2005 Document Revised: 05/21/2016 Document Reviewed: 07/30/2014 Elsevier Interactive Patient Education  2018 [employer].

## 2018-04-20 ENCOUNTER — Inpatient Hospital Stay: Payer: Medicaid Other

## 2018-04-20 VITALS — BP 127/80 | HR 86 | Temp 98.4°F | Resp 18

## 2018-04-20 DIAGNOSIS — C189 Malignant neoplasm of colon, unspecified: Secondary | ICD-10-CM

## 2018-04-20 DIAGNOSIS — Z5112 Encounter for antineoplastic immunotherapy: Secondary | ICD-10-CM | POA: Diagnosis not present

## 2018-04-20 MED ORDER — SODIUM CHLORIDE 0.9% FLUSH
10.0000 mL | INTRAVENOUS | Status: DC | PRN
  Administered 2018-04-20: 10 mL
  Filled 2018-04-20: qty 10

## 2018-04-20 MED ORDER — HEPARIN SOD (PORK) LOCK FLUSH 100 UNIT/ML IV SOLN
500.0000 [IU] | Freq: Once | INTRAVENOUS | Status: AC | PRN
  Administered 2018-04-20: 500 [IU]
  Filled 2018-04-20: qty 5

## 2018-04-22 ENCOUNTER — Telehealth: Payer: Self-pay

## 2018-04-22 NOTE — Telephone Encounter (Signed)
Daughter called to verify appointment. Per 4/26 phone que

## 2018-04-23 NOTE — Progress Notes (Signed)
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 in the chest. 3. Persistent splenic flexure colonic wall thickening, with right-sided colostomy in place. 4.  Aortic Atherosclerosis (ICD10-I70.0).  CT AP W Contrast 08/15/17 IMPRESSION: 1. Early or new small bowel obstruction with a transition point in the right abdomen as above. The adjacent swirling vessels are most consistent with an internal hernia. 2. The mass involving the pancreatic tail and spleen is similar in the interval. 3. The previously identified abscess inferior to the pancreatic mass has resolved with minimal fluid in this region. The tube is been removed. 4. Continued thickening of the colon as above consistent with the patient's known malignancy. 5. Atherosclerotic changes in the aorta. Aortic Atherosclerosis (ICD10-I70.0).   CT AP W Contrast 07/29/17 IMPRESSION: 1. Bowel obstruction at the level of the proximal descending colon at the location of a percutaneous drainage catheter. This could be secondary to wall thickening  by the adjacent inflammatory process. Wall thickening due to colon neoplasm also remains a possibility. 2. Dilated, fluid-filled appendix, similar to the dilated loops of colon and small bowel, compatible with dilatation due to the bowel obstruction. There are no findings to indicate appendicitis. 3. Small amount of free peritoneal fluid. 4. The percutaneously drained fluid collection in the left mid abdomen has with resolved. 5. Mild decrease in size of the multiloculated fluid collection in the splenic hilum and distal tail of the pancreas. 6. Stable 8 mm diffusely enhancing mass in the dome of the liver on the right. This is most likely benign. A solitary enhancing metastasis is less likely. 7. Moderate prostatic hypertrophy.  CT CP W Contrast 07/10/17 IMPRESSION: The salient finding is a new abscess measuring 3.8 x 4.6 cm inferior to the pancreatic tail and spleen.  CT CAP w contrast 07/05/2017  IMPRESSION: 1. Today's study demonstrates some positive response to therapy with decreased size of mass associated with the descending colon, decreased size of the invasive mass extending from the tail of the pancreas into the splenic hilum and decreased size of adjacent peritoneal lesions. There has also been regression of upper abdominal lymphadenopathy. 2. No new pulmonary or hepatic lesions identified to suggest progressive metastatic disease. 3. Aortic atherosclerosis. 4. Additional incidental findings, as above.  CT CAP w Contrast 04/09/2017 IMPRESSION: 1. Response to therapy with decreased peritoneal tumor volume throughout the abdomen. 2. No well-defined residual colonic mass. No bowel obstruction or other acute complication. 3. Decrease in gastrohepatic ligament adenopathy. 4. No new sites of disease. 5.  No acute process or evidence of metastatic disease in the chest. 6.  Aortic atherosclerosis. 7. Mild gynecomastia.  Colonoscopy 01/12/2017 Impression: - Malignant near  obstructing tumor in the descending colon and at the splenic flexure. Biopsied. Tattooed. - One 25 mm polyp at the recto-sigmoid colon, removed with a hot snare. Resected and retrieved. Clips (MR unsafe) were placed.  Lower Extremity Ultrasound 02/16/17 Bilateral lower extremity venous duplex complete. There is evidence of deep vein thrombosis involving the femoral, popliteal, and peroneal veins of the right lower extremity.  There is no evidence of superficial vein thrombosis involving the right lower extremity. There is evidence of superficial vein thrombosis involving the lesser saphenous vein of the left lower extremity. There  is no evidence of deep vein thrombosis involving the left lower extremity. There is no evidence of a Baker's cyst bilaterally.  ASSESSMENT & PLAN:  57 y.o.  African-American male, without a significant past medical history, no routine medical care, presented with intermittent rectal bleeding, anemia  and worsening abdominal pain for 3 years.   1. 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  -His case was previously reviewed in our GI tumor Board, unfortunately he has peritoneum metastasis, with a large 11 cm peritoneal metastasis in the left upper quadrant, which is consistent with peritoneal metastasis. -We previously reviewed the natural history of metastatic colon cancer. Unfortunately his cancer is incurable at this stage. We discussed the goal of therapy is palliative, to prolong his life and improve his quality of life. -He has started first line chemo FOLFOX. Due to history of pancytopenia, I'll skip the 5-FU bolus. -I previously discussed the [facility] genomic test result, which showed wild type KRAS, NRAS, and BRAF, MSI-stable. Based on this and his left-sided colon cancer, he will significantly benefit  from EGFR inhibitor. He has started on panitumumab and has been tolerating well. -I previously reviewed the CT scan from 04/09/17 with the patient in detail. He has had excellent partial response to the chemotherapy treatment, no other new lesions, we'll continue current chemotherapy regimen. -I reviewed his restaging CT scan from 07/05/2017, which showed a continuous response, no new lesions. We'll continue current treatment. -He previously developed a moderately thrombocytopenia, chemotherapy dose was reduced  --Due to Pancreatic Abscess,  His recent hospitalization, chemotherapy has been held. - he has  Being recovering well from his recent hospitalization, still in rehabilitation, but ambulates  Without any difficulty, he is also eating better. - due to his recent thrombosis and GI bleeding, he is not a candidate for Avastin for now. Given questionable disease progression, I recommend him to continue panitumumab for now - due to his recent bowel obstruction, I think he probably has had disease progression, although scan showed stable disease . I recommend to switch his chemotherapy to irinotecan alone, or FOLFIRI if he is able to tolerate.  - the goal of therapy is palliative. - we again discussed the incurable nature of his cancer, an overall poor prognosis. The goal of therapy is palliative. -Started irinotecan and panitumumab on 09/21/17. Tolerated well with increased ostomy output. Added 5-fu back with cycle 2 on 10/04/17 and reduced FOLFIRI due to overall limited PS. Increased to full dose FOLFIRI on 10/18/17, he tolerated well overall with mild to moderate diarrhea. -encouraged him to manage his diarrhea with imodium and lomotil  -I suggest he increase his Lomotil to up to 6-8 tablets daily if he experiences significant diarrhea again.   -We previously discussed his CT CAP on 11/25/17 that indicates response to therapy of intraperitoneal metastasis including within the splenic hilum; no evidence  of metastatic disease in the chest -Pt did not tolerate the cycle 5 FOLFIRI well, despite dose reduction on both agents.  He is very reluctant to continue same regiment. Given his restaging CT scan showed mixed response, but overall stable disease,  I will hold Irinotecan, and continue 5-FU/leucovorin and Panitumumab as maintenance therapy. He agreed and started on 11/29/17 -he has tolerated maintenance therapy well, without significant diarrhea.  -During his episode of extensive left lower extremity DVT on 01/25/18, his treatment was held.    Chemo treatment restarted after he recovered from DVT -He had a CT abdomen and pelvis without contrast when he was in the hospital on January 08, 2018, which showed overall stable peritoneal metastasis. -Pt did not want to proceed with chemo on 02/21/18 due to feeling down and cold symptoms.  -CT CAP W Contrast  From 03/31/18 revealed disease progression of the splenic hilum mass, now 7.7 cm from 5.2 cm. -I recommended adding irinotecan back, which was stopped due to diarrhea. I will reduce the dose.  Patient agrees, but wishes to change in the next cycle in 2 weeks. He has likely developed resistance to Panitumumab, so I will discontinue.  Due to his extensive history of thrombosis and GI bleeding, I do not think he is a candidate for Avastin.  I also discussed the clinical trial options at [facility], especially [id number], Phase II single-arm study of palbociclib and cetuximab in RAS/RAF wildtype metastatic CRC. He is interested. -He restarted FOLFIRI on 04/18/18 with dose reduction.  -He reports diarrhea since starting FOLFIRI, he has lomotil and imodium at home. I reviewed how to use the anti-diarrheal medical with him today, he is not using adequately.   -continue chemo every 2 weeks. Cycle 2 FOLFIRI in 1 week and f/u in 3 weeks with Jeff [name]     2. History of right LE DVT, and extensive LLE DVT  -I previously reviewed his ultrasound results from 02/17/17, which  showed DVT involving the femoral, popliteal, and peritoneal vein of the right lower extremity from 2/22. -He had history of right lower extremity DVT after surgery before  - his Xarelto has been held due to GI bleeding he has a IVC placed -No recurrence of bleeding since IVC placement.  -Pt was admitted to the hospital on 12/30/17 and discharged on 01/11/18. He presented with progressively worsening pain and swelling in the left leg. Iimaging studies notable for worsening left LE DVT. Initially hesitant to start anticoagulation due to known history of GI bleed while on Xarelto. Started on Eliquis and transferred to [facility] for thrombectomy. His left leg edema resolved after thrombectomy, he was discharged with Xarelto (he refused lovenox) 15mg bid -He noticed recurrent GI bleeding around 01/25/2018, resolved after holding Xarelto.  Has restarted Xarelto at 20 mg daily dose, tolerating well without bleeding. -I will refill his Xarelto. He will switch to 15 mg next bottle.   3. 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 -- continue oxycodone as needed, pain controlled  -Has also developed some back pain in the past few weeks, related to chemo.  Using oxycodone for the pain. -I called in Neurontin 100mg for his shooting leg pain (11/15/17). He can start low dose with 1 tab at night and increase up to 3 tablets in the evening if he can tolerate it.  -pain controlled -I advised him to take his Neurontin at night, he can take 200 mg.   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 -He has received IV Feraheme twice, but his anemia has not improved. He likely has component of anemia of chronic disease secondary to his underlying malignancy. -Ferritin 196 on 10/04/17, WNL, Hgb 9.8; will continue to monitor  -Persistent anemia, Hg improved to 12.4 (04/25/18). Continue ferrous Sulfate, no  need for blood transfusion today.  5. Weight loss, malnutrition, anorexia -Secondary to chemotherapy and cancer -The patient will continue   to drink dietary supplements. -He has gained some weight lately - I again strongly encouraged him to try to eat more to avoid losing excess weight. I again recommended he drink 2 ensure or boost supplements a day to help. -I previously prescribed mirtazapine on 05/25/17 in attempts to help his appetite. He knows to take it once every evening. - continue follow-up with dietitian, previous f/u 10/22 -His weight is stable overall lately   6. History of pancreatic Abscess -Presented to hospital on 07/09/17 for abdominal pain. Based on SP CT from 07/10/17 he found to have an abscess. Draining tube placed and he received prolonged course of antibiotics  - His percutaneous drainage tube has been removed  -this has resolved   7. Bowel obstruction due to his colon mass at the splenic flexure and surrounding inflammation due to recent abscess, s/p diverting colostomy  -He was hospitalized on 07/29/17 for SBO  -He had sigmoidoscopy and loop Colostomy and has a J tube placed.  - he is tolerating diet well now. -Bowel movements overall have been normal, but sometimes he has softer stools and are more loose.  -I again reviewed the usage of both imodium and colace and in what context he should be using them both for his stool consistencies.  -He has colostomy prolapse, he knows to avoid lifting and carry heavy things -He will see his surgeon early 10/2017  -Diarrhea controlled  -He has loose to watery stools which improves a few days after chemo. I prescribed lomotil on 11/01/17. He should take imodium first and if not enough he should take lomotil to control his diarrhea.   8. Diarrhea  -Second to chemotherapy -Recommend him to take Lomotil 1 to 2 tablets 3 times daily before each meals, and use Imodium 2 tablets every 6-8 hours as needed in between  9. Goal of care  discussion  -We previously discussed the incurable nature of his cancer, and the overall poor prognosis, especially if he does not have good response to chemotherapy or progress on chemo -The patient understands the goal of care is palliative. -I have previously recommended DNR/DNI, he will think about it.    Plan: -anti-diarrheal medication use reviewed today  -K supplement refilled  -cycle 2 FOLFIRI in 1 week with same dose and f/u in 3 weeks with Jeff [name]  -he will try to have his daughter come with him next time and I can discuss clinical trial options again with them at that time   All questions were answered. The patient knows to call the clinic with any problems, questions or concerns.  I spent 20 minutes counseling the patient face to face. The total time spent in the appointment was 25 minutes and more than 50% was on counseling.  This document serves as a record of services personally performed by  , MD. It was created on her behalf by Dana Waskiewicz, a trained medical scribe. The creation of this record is based on the scribe's personal observations and the provider's statements to them.   I have reviewed the above documentation for accuracy and completeness, and I agree with the above.     , MD 04/25/18            

## 2018-04-25 ENCOUNTER — Inpatient Hospital Stay (HOSPITAL_BASED_OUTPATIENT_CLINIC_OR_DEPARTMENT_OTHER): Payer: Medicaid Other | Admitting: Hematology

## 2018-04-25 ENCOUNTER — Telehealth: Payer: Self-pay | Admitting: Hematology

## 2018-04-25 ENCOUNTER — Inpatient Hospital Stay: Payer: Medicaid Other

## 2018-04-25 ENCOUNTER — Encounter: Payer: Self-pay | Admitting: Hematology

## 2018-04-25 VITALS — BP 100/69 | HR 88 | Temp 98.0°F | Resp 17 | Ht 69.0 in | Wt 127.6 lb

## 2018-04-25 DIAGNOSIS — L270 Generalized skin eruption due to drugs and medicaments taken internally: Secondary | ICD-10-CM

## 2018-04-25 DIAGNOSIS — C786 Secondary malignant neoplasm of retroperitoneum and peritoneum: Secondary | ICD-10-CM

## 2018-04-25 DIAGNOSIS — D509 Iron deficiency anemia, unspecified: Secondary | ICD-10-CM

## 2018-04-25 DIAGNOSIS — C186 Malignant neoplasm of descending colon: Secondary | ICD-10-CM

## 2018-04-25 DIAGNOSIS — G893 Neoplasm related pain (acute) (chronic): Secondary | ICD-10-CM

## 2018-04-25 DIAGNOSIS — M79606 Pain in leg, unspecified: Secondary | ICD-10-CM | POA: Diagnosis not present

## 2018-04-25 DIAGNOSIS — C762 Malignant neoplasm of abdomen: Secondary | ICD-10-CM

## 2018-04-25 DIAGNOSIS — Z79899 Other long term (current) drug therapy: Secondary | ICD-10-CM

## 2018-04-25 DIAGNOSIS — C189 Malignant neoplasm of colon, unspecified: Secondary | ICD-10-CM

## 2018-04-25 DIAGNOSIS — Z86718 Personal history of other venous thrombosis and embolism: Secondary | ICD-10-CM | POA: Diagnosis not present

## 2018-04-25 DIAGNOSIS — E876 Hypokalemia: Secondary | ICD-10-CM

## 2018-04-25 DIAGNOSIS — Z933 Colostomy status: Secondary | ICD-10-CM | POA: Diagnosis not present

## 2018-04-25 DIAGNOSIS — D630 Anemia in neoplastic disease: Secondary | ICD-10-CM | POA: Diagnosis not present

## 2018-04-25 DIAGNOSIS — R630 Anorexia: Secondary | ICD-10-CM

## 2018-04-25 DIAGNOSIS — Z7901 Long term (current) use of anticoagulants: Secondary | ICD-10-CM

## 2018-04-25 DIAGNOSIS — Z5112 Encounter for antineoplastic immunotherapy: Secondary | ICD-10-CM | POA: Diagnosis not present

## 2018-04-25 LAB — CBC WITH DIFFERENTIAL/PLATELET
Basophils Absolute: 0.1 10*3/uL (ref 0.0–0.1)
Basophils Relative: 1 %
EOS PCT: 3 %
Eosinophils Absolute: 0.2 10*3/uL (ref 0.0–0.5)
HEMATOCRIT: 38.2 % — AB (ref 38.4–49.9)
Hemoglobin: 12.4 g/dL — ABNORMAL LOW (ref 13.0–17.1)
LYMPHS ABS: 1.5 10*3/uL (ref 0.9–3.3)
LYMPHS PCT: 28 %
MCH: 28.9 pg (ref 27.2–33.4)
MCHC: 32.5 g/dL (ref 32.0–36.0)
MCV: 89 fL (ref 79.3–98.0)
Monocytes Absolute: 0.6 10*3/uL (ref 0.1–0.9)
Monocytes Relative: 10 %
NEUTROS ABS: 3.1 10*3/uL (ref 1.5–6.5)
Neutrophils Relative %: 58 %
Platelets: 144 10*3/uL (ref 140–400)
RBC: 4.29 MIL/uL (ref 4.20–5.82)
RDW: 14.6 % (ref 11.0–14.6)
WBC: 5.4 10*3/uL (ref 4.0–10.3)

## 2018-04-25 LAB — COMPREHENSIVE METABOLIC PANEL
ALT: 8 U/L (ref 0–55)
AST: 28 U/L (ref 5–34)
Albumin: 4.1 g/dL (ref 3.5–5.0)
Alkaline Phosphatase: 81 U/L (ref 40–150)
Anion gap: 7 (ref 3–11)
BUN: 12 mg/dL (ref 7–26)
CHLORIDE: 104 mmol/L (ref 98–109)
CO2: 26 mmol/L (ref 22–29)
Calcium: 10 mg/dL (ref 8.4–10.4)
Creatinine, Ser: 0.94 mg/dL (ref 0.70–1.30)
GFR calc Af Amer: >60 mL/min (ref >60)
Glucose, Bld: 99 mg/dL (ref 70–140)
POTASSIUM: 4 mmol/L (ref 3.5–5.1)
Sodium: 137 mmol/L (ref 136–145)
Total Bilirubin: 0.4 mg/dL (ref 0.2–1.2)
Total Protein: 8.9 g/dL — ABNORMAL HIGH (ref 6.4–8.3)

## 2018-04-25 MED ORDER — POTASSIUM CHLORIDE CRYS ER 20 MEQ PO TBCR: 20.0000 meq | EXTENDED_RELEASE_TABLET | Freq: Every day | ORAL | 2 refills | Status: DC

## 2018-04-25 MED FILL — POTASSIUM CL ER 20 MEQ TABL: 20 | 30 days supply | Qty: 30 | Fill #0

## 2018-04-25 NOTE — Telephone Encounter (Signed)
No LOS 4/29

## 2018-05-02 ENCOUNTER — Other Ambulatory Visit: Payer: Self-pay | Admitting: Hematology

## 2018-05-02 ENCOUNTER — Telehealth: Payer: Self-pay

## 2018-05-02 ENCOUNTER — Other Ambulatory Visit: Payer: Self-pay

## 2018-05-02 ENCOUNTER — Inpatient Hospital Stay: Payer: Medicaid Other

## 2018-05-02 ENCOUNTER — Inpatient Hospital Stay: Payer: Medicaid Other | Attending: Hematology

## 2018-05-02 VITALS — BP 124/91 | HR 88 | Temp 98.8°F | Resp 18 | Wt 129.0 lb

## 2018-05-02 DIAGNOSIS — C189 Malignant neoplasm of colon, unspecified: Secondary | ICD-10-CM

## 2018-05-02 DIAGNOSIS — C786 Secondary malignant neoplasm of retroperitoneum and peritoneum: Secondary | ICD-10-CM | POA: Insufficient documentation

## 2018-05-02 DIAGNOSIS — Z452 Encounter for adjustment and management of vascular access device: Secondary | ICD-10-CM | POA: Diagnosis not present

## 2018-05-02 DIAGNOSIS — D630 Anemia in neoplastic disease: Secondary | ICD-10-CM | POA: Insufficient documentation

## 2018-05-02 DIAGNOSIS — C186 Malignant neoplasm of descending colon: Secondary | ICD-10-CM | POA: Insufficient documentation

## 2018-05-02 DIAGNOSIS — Z933 Colostomy status: Secondary | ICD-10-CM | POA: Insufficient documentation

## 2018-05-02 DIAGNOSIS — Z7189 Other specified counseling: Secondary | ICD-10-CM | POA: Insufficient documentation

## 2018-05-02 DIAGNOSIS — Z5111 Encounter for antineoplastic chemotherapy: Secondary | ICD-10-CM | POA: Insufficient documentation

## 2018-05-02 DIAGNOSIS — C762 Malignant neoplasm of abdomen: Secondary | ICD-10-CM

## 2018-05-02 DIAGNOSIS — R109 Unspecified abdominal pain: Secondary | ICD-10-CM | POA: Diagnosis not present

## 2018-05-02 DIAGNOSIS — Z95828 Presence of other vascular implants and grafts: Secondary | ICD-10-CM

## 2018-05-02 DIAGNOSIS — D500 Iron deficiency anemia secondary to blood loss (chronic): Secondary | ICD-10-CM

## 2018-05-02 LAB — CBC WITH DIFFERENTIAL/PLATELET
Basophils Absolute: 0 10*3/uL (ref 0.0–0.1)
Basophils Relative: 1 %
EOS ABS: 0.3 10*3/uL (ref 0.0–0.5)
EOS PCT: 4 %
HCT: 33.9 % — ABNORMAL LOW (ref 38.4–49.9)
Hemoglobin: 11.1 g/dL — ABNORMAL LOW (ref 13.0–17.1)
LYMPHS ABS: 1.2 10*3/uL (ref 0.9–3.3)
Lymphocytes Relative: 21 %
MCH: 29.1 pg (ref 27.2–33.4)
MCHC: 32.8 g/dL (ref 32.0–36.0)
MCV: 88.8 fL (ref 79.3–98.0)
MONOS PCT: 7 %
Monocytes Absolute: 0.4 10*3/uL (ref 0.1–0.9)
NEUTROS PCT: 67 %
Neutro Abs: 4 10*3/uL (ref 1.5–6.5)
PLATELETS: 141 10*3/uL (ref 140–400)
RBC: 3.81 MIL/uL — ABNORMAL LOW (ref 4.20–5.82)
RDW: 15.8 % — ABNORMAL HIGH (ref 11.0–14.6)
WBC: 6 10*3/uL (ref 4.0–10.3)

## 2018-05-02 LAB — COMPREHENSIVE METABOLIC PANEL
ALK PHOS: 76 U/L (ref 40–150)
ALT: 6 U/L (ref 0–55)
AST: 27 U/L (ref 5–34)
Albumin: 3.6 g/dL (ref 3.5–5.0)
Anion gap: 8 (ref 3–11)
BILIRUBIN TOTAL: 0.2 mg/dL (ref 0.2–1.2)
BUN: 10 mg/dL (ref 7–26)
CALCIUM: 8.9 mg/dL (ref 8.4–10.4)
CO2: 22 mmol/L (ref 22–29)
Chloride: 108 mmol/L (ref 98–109)
Creatinine, Ser: 1.09 mg/dL (ref 0.70–1.30)
Glucose, Bld: 86 mg/dL (ref 70–140)
Potassium: 4 mmol/L (ref 3.5–5.1)
SODIUM: 138 mmol/L (ref 136–145)
TOTAL PROTEIN: 7.9 g/dL (ref 6.4–8.3)

## 2018-05-02 LAB — MAGNESIUM: Magnesium: 1.4 mg/dL — CL (ref 1.7–2.4)

## 2018-05-02 MED ORDER — SODIUM CHLORIDE 0.9% FLUSH
10.0000 mL | INTRAVENOUS | Status: DC | PRN
  Administered 2018-05-02: 10 mL
  Filled 2018-05-02: qty 10

## 2018-05-02 MED ORDER — SODIUM CHLORIDE 0.9 % IV SOLN
Freq: Once | INTRAVENOUS | Status: AC
  Administered 2018-05-02: 10:00:00 via INTRAVENOUS

## 2018-05-02 MED ORDER — SODIUM CHLORIDE 0.9 % IV SOLN
2200.0000 mg/m2 | INTRAVENOUS | Status: DC
  Administered 2018-05-02: 3650 mg via INTRAVENOUS
  Filled 2018-05-02: qty 73

## 2018-05-02 MED ORDER — ATROPINE SULFATE 1 MG/ML IJ SOLN
0.5000 mg | Freq: Once | INTRAMUSCULAR | Status: AC | PRN
  Administered 2018-05-02: 0.5 mg via INTRAVENOUS

## 2018-05-02 MED ORDER — PALONOSETRON HCL INJECTION 0.25 MG/5ML
0.2500 mg | Freq: Once | INTRAVENOUS | Status: AC
  Administered 2018-05-02: 0.25 mg via INTRAVENOUS

## 2018-05-02 MED ORDER — ATROPINE SULFATE 1 MG/ML IJ SOLN
INTRAMUSCULAR | Status: AC
  Filled 2018-05-02: qty 1

## 2018-05-02 MED ORDER — RIVAROXABAN 15 MG PO TABS: 15.0000 mg | ORAL_TABLET | Freq: Two times a day (BID) | ORAL | 3 refills | Status: DC

## 2018-05-02 MED ORDER — DEXTROSE 5 % IV SOLN
120.0000 mg/m2 | Freq: Once | INTRAVENOUS | Status: AC
  Administered 2018-05-02: 200 mg via INTRAVENOUS
  Filled 2018-05-02: qty 10

## 2018-05-02 MED ORDER — DEXAMETHASONE SODIUM PHOSPHATE 10 MG/ML IJ SOLN
INTRAMUSCULAR | Status: AC
  Filled 2018-05-02: qty 1

## 2018-05-02 MED ORDER — LEUCOVORIN CALCIUM INJECTION 350 MG
400.0000 mg/m2 | Freq: Once | INTRAVENOUS | Status: AC
  Administered 2018-05-02: 660 mg via INTRAVENOUS
  Filled 2018-05-02: qty 33

## 2018-05-02 MED ORDER — PALONOSETRON HCL INJECTION 0.25 MG/5ML
INTRAVENOUS | Status: AC
  Filled 2018-05-02: qty 5

## 2018-05-02 MED ORDER — DIPHENOXYLATE-ATROPINE 2.5-0.025 MG PO TABS: 2.0000 | ORAL_TABLET | Freq: Four times a day (QID) | ORAL | 1 refills | Status: DC | PRN

## 2018-05-02 MED ORDER — OXYCODONE HCL 10 MG PO TABS: 10.0000 mg | ORAL_TABLET | ORAL | 0 refills | Status: DC | PRN

## 2018-05-02 MED ORDER — DEXAMETHASONE SODIUM PHOSPHATE 10 MG/ML IJ SOLN
10.0000 mg | Freq: Once | INTRAMUSCULAR | Status: AC
  Administered 2018-05-02: 10 mg via INTRAVENOUS

## 2018-05-02 MED FILL — XARELTO 15 MG TABLET: 15 | 15 days supply | Qty: 30 | Fill #0

## 2018-05-02 MED FILL — oxyCODONE HCL 10 MG TABS: 10 | 15 days supply | Qty: 90 | Fill #0

## 2018-05-02 MED FILL — DIPHENOXYLATE-ATROPINE 2.5-: 2.5-0.025 | 12 days supply | Qty: 90 | Fill #0

## 2018-05-02 NOTE — Patient Instructions (Signed)
[facility] Discharge Instructions for Patients Receiving Chemotherapy  Today you received the following chemotherapy agents Irinotecan, Leucovorin, Adrucil  To help prevent nausea and vomiting after your treatment, we encourage you to take your nausea medication as directed.   If you develop nausea and vomiting that is not controlled by your nausea medication, call the clinic.   BELOW ARE SYMPTOMS THAT SHOULD BE REPORTED IMMEDIATELY:  *FEVER GREATER THAN 100.5 F  *CHILLS WITH OR WITHOUT FEVER  NAUSEA AND VOMITING THAT IS NOT CONTROLLED WITH YOUR NAUSEA MEDICATION  *UNUSUAL SHORTNESS OF BREATH  *UNUSUAL BRUISING OR BLEEDING  TENDERNESS IN MOUTH AND THROAT WITH OR WITHOUT PRESENCE OF ULCERS  *URINARY PROBLEMS  *BOWEL PROBLEMS  UNUSUAL RASH Items with * indicate a potential emergency and should be followed up as soon as possible.  Feel free to call the clinic should you have any questions or concerns. The clinic phone number is [phone].  Please show the CHEMO ALERT CARD at check-in to the Emergency Department and triage nurse.   

## 2018-05-02 NOTE — Telephone Encounter (Signed)
Instructed patient while in Infusion I to take Magnesium 400 mg twice daily x 1 week every day.  Patient states he is currently taking it once a day.  Emphasized to take it every day. Do not miss a dose.  Patient verbalized an understanding.

## 2018-05-04 ENCOUNTER — Inpatient Hospital Stay: Payer: Medicaid Other

## 2018-05-04 DIAGNOSIS — Z5111 Encounter for antineoplastic chemotherapy: Secondary | ICD-10-CM | POA: Diagnosis not present

## 2018-05-04 DIAGNOSIS — Z95828 Presence of other vascular implants and grafts: Secondary | ICD-10-CM

## 2018-05-04 DIAGNOSIS — D500 Iron deficiency anemia secondary to blood loss (chronic): Secondary | ICD-10-CM

## 2018-05-04 MED ORDER — HEPARIN SOD (PORK) LOCK FLUSH 100 UNIT/ML IV SOLN
500.0000 [IU] | Freq: Once | INTRAVENOUS | Status: AC
  Administered 2018-05-04: 500 [IU]
  Filled 2018-05-04: qty 5

## 2018-05-04 MED ORDER — SODIUM CHLORIDE 0.9% FLUSH
10.0000 mL | Freq: Once | INTRAVENOUS | Status: AC | PRN
  Administered 2018-05-04: 10 mL
  Filled 2018-05-04: qty 10

## 2018-05-04 NOTE — Patient Instructions (Signed)
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
## 2018-05-16 ENCOUNTER — Encounter: Payer: Self-pay | Admitting: Nurse Practitioner

## 2018-05-16 ENCOUNTER — Inpatient Hospital Stay (HOSPITAL_BASED_OUTPATIENT_CLINIC_OR_DEPARTMENT_OTHER): Payer: Medicaid Other | Admitting: Nurse Practitioner

## 2018-05-16 ENCOUNTER — Inpatient Hospital Stay: Payer: Medicaid Other

## 2018-05-16 VITALS — BP 127/92 | HR 93 | Temp 98.1°F | Resp 18 | Ht 69.0 in | Wt 129.9 lb

## 2018-05-16 DIAGNOSIS — C186 Malignant neoplasm of descending colon: Secondary | ICD-10-CM

## 2018-05-16 DIAGNOSIS — C786 Secondary malignant neoplasm of retroperitoneum and peritoneum: Secondary | ICD-10-CM | POA: Diagnosis not present

## 2018-05-16 DIAGNOSIS — Z7189 Other specified counseling: Secondary | ICD-10-CM

## 2018-05-16 DIAGNOSIS — R109 Unspecified abdominal pain: Secondary | ICD-10-CM

## 2018-05-16 DIAGNOSIS — Z933 Colostomy status: Secondary | ICD-10-CM

## 2018-05-16 DIAGNOSIS — B370 Candidal stomatitis: Secondary | ICD-10-CM

## 2018-05-16 DIAGNOSIS — C762 Malignant neoplasm of abdomen: Secondary | ICD-10-CM

## 2018-05-16 DIAGNOSIS — D630 Anemia in neoplastic disease: Secondary | ICD-10-CM

## 2018-05-16 DIAGNOSIS — C189 Malignant neoplasm of colon, unspecified: Secondary | ICD-10-CM

## 2018-05-16 DIAGNOSIS — Z95828 Presence of other vascular implants and grafts: Secondary | ICD-10-CM

## 2018-05-16 DIAGNOSIS — Z5111 Encounter for antineoplastic chemotherapy: Secondary | ICD-10-CM | POA: Diagnosis not present

## 2018-05-16 LAB — CBC WITH DIFFERENTIAL/PLATELET
BASOS ABS: 0.1 10*3/uL (ref 0.0–0.1)
BASOS PCT: 1 %
Eosinophils Absolute: 0.2 10*3/uL (ref 0.0–0.5)
Eosinophils Relative: 4 %
HCT: 35.4 % — ABNORMAL LOW (ref 38.4–49.9)
HEMOGLOBIN: 11.5 g/dL — AB (ref 13.0–17.1)
Lymphocytes Relative: 21 %
Lymphs Abs: 1.2 10*3/uL (ref 0.9–3.3)
MCH: 29.1 pg (ref 27.2–33.4)
MCHC: 32.6 g/dL (ref 32.0–36.0)
MCV: 89.4 fL (ref 79.3–98.0)
Monocytes Absolute: 0.5 10*3/uL (ref 0.1–0.9)
Monocytes Relative: 8 %
NEUTROS ABS: 3.9 10*3/uL (ref 1.5–6.5)
NEUTROS PCT: 66 %
PLATELETS: 151 10*3/uL (ref 140–400)
RBC: 3.96 MIL/uL — AB (ref 4.20–5.82)
RDW: 15.8 % — AB (ref 11.0–14.6)
WBC: 5.9 10*3/uL (ref 4.0–10.3)

## 2018-05-16 LAB — COMPREHENSIVE METABOLIC PANEL
ALK PHOS: 86 U/L (ref 40–150)
ALT: 10 U/L (ref 0–55)
AST: 22 U/L (ref 5–34)
Albumin: 3.7 g/dL (ref 3.5–5.0)
Anion gap: 9 (ref 3–11)
BILIRUBIN TOTAL: 0.4 mg/dL (ref 0.2–1.2)
BUN: 7 mg/dL (ref 7–26)
CALCIUM: 9.4 mg/dL (ref 8.4–10.4)
CO2: 24 mmol/L (ref 22–29)
Chloride: 105 mmol/L (ref 98–109)
Creatinine, Ser: 0.83 mg/dL (ref 0.70–1.30)
GFR calc non Af Amer: >60 mL/min (ref >60)
Glucose, Bld: 82 mg/dL (ref 70–140)
Potassium: 3.9 mmol/L (ref 3.5–5.1)
Sodium: 138 mmol/L (ref 136–145)
TOTAL PROTEIN: 8.1 g/dL (ref 6.4–8.3)

## 2018-05-16 LAB — CEA (IN HOUSE-CHCC): CEA (CHCC-IN HOUSE): 16.15 ng/mL — AB (ref 0.00–5.00)

## 2018-05-16 LAB — MAGNESIUM: MAGNESIUM: 1.6 mg/dL — AB (ref 1.7–2.4)

## 2018-05-16 LAB — IRON AND TIBC
Iron: 60 ug/dL (ref 42–163)
Saturation Ratios: 21 % — ABNORMAL LOW (ref 42–163)
TIBC: 290 ug/dL (ref 202–409)
UIBC: 231 ug/dL

## 2018-05-16 LAB — FERRITIN: FERRITIN: 177 ng/mL (ref 22–316)

## 2018-05-16 MED ORDER — IRINOTECAN HCL CHEMO INJECTION 100 MG/5ML
120.0000 mg/m2 | Freq: Once | INTRAVENOUS | Status: AC
  Administered 2018-05-16: 200 mg via INTRAVENOUS
  Filled 2018-05-16: qty 10

## 2018-05-16 MED ORDER — PALONOSETRON HCL INJECTION 0.25 MG/5ML
INTRAVENOUS | Status: AC
  Filled 2018-05-16: qty 5

## 2018-05-16 MED ORDER — SODIUM CHLORIDE 0.9% FLUSH
10.0000 mL | INTRAVENOUS | Status: DC | PRN
  Administered 2018-05-16: 10 mL via INTRAVENOUS
  Filled 2018-05-16: qty 10

## 2018-05-16 MED ORDER — ATROPINE SULFATE 1 MG/ML IJ SOLN
INTRAMUSCULAR | Status: AC
  Filled 2018-05-16: qty 1

## 2018-05-16 MED ORDER — SODIUM CHLORIDE 0.9 % IV SOLN
Freq: Once | INTRAVENOUS | Status: AC
  Administered 2018-05-16: 11:00:00 via INTRAVENOUS

## 2018-05-16 MED ORDER — ATROPINE SULFATE 1 MG/ML IJ SOLN
0.5000 mg | Freq: Once | INTRAMUSCULAR | Status: AC | PRN
  Administered 2018-05-16: 0.5 mg via INTRAVENOUS

## 2018-05-16 MED ORDER — DEXAMETHASONE SODIUM PHOSPHATE 10 MG/ML IJ SOLN
INTRAMUSCULAR | Status: AC
  Filled 2018-05-16: qty 1

## 2018-05-16 MED ORDER — DEXAMETHASONE SODIUM PHOSPHATE 10 MG/ML IJ SOLN
10.0000 mg | Freq: Once | INTRAMUSCULAR | Status: AC
  Administered 2018-05-16: 10 mg via INTRAVENOUS

## 2018-05-16 MED ORDER — NYSTATIN 100000 UNIT/ML MT SUSP: 5.0000 mL | Freq: Four times a day (QID) | OROMUCOSAL | 0 refills | Status: DC

## 2018-05-16 MED ORDER — PALONOSETRON HCL INJECTION 0.25 MG/5ML
0.2500 mg | Freq: Once | INTRAVENOUS | Status: AC
  Administered 2018-05-16: 0.25 mg via INTRAVENOUS

## 2018-05-16 MED ORDER — DEXTROSE 5 % IV SOLN
400.0000 mg/m2 | Freq: Once | INTRAVENOUS | Status: AC
  Administered 2018-05-16: 660 mg via INTRAVENOUS
  Filled 2018-05-16: qty 33

## 2018-05-16 MED ORDER — OXYCODONE HCL 10 MG PO TABS: 10.0000 mg | ORAL_TABLET | ORAL | 0 refills | Status: DC | PRN

## 2018-05-16 MED ORDER — SODIUM CHLORIDE 0.9 % IV SOLN
2200.0000 mg/m2 | INTRAVENOUS | Status: DC
  Administered 2018-05-16: 3650 mg via INTRAVENOUS
  Filled 2018-05-16: qty 73

## 2018-05-16 MED FILL — NYSTATIN 100,000 UNITS/ML S: 100000 | 3 days supply | Qty: 60 | Fill #0

## 2018-05-16 NOTE — Patient Instructions (Signed)
[facility] Discharge Instructions for Patients Receiving Chemotherapy  Today you received the following chemotherapy agents leucovorin/irinotecan/florouracil   To help prevent nausea and vomiting after your treatment, we encourage you to take your nausea medication as directed  If you develop nausea and vomiting that is not controlled by your nausea medication, call the clinic.   BELOW ARE SYMPTOMS THAT SHOULD BE REPORTED IMMEDIATELY:  *FEVER GREATER THAN 100.5 F  *CHILLS WITH OR WITHOUT FEVER  NAUSEA AND VOMITING THAT IS NOT CONTROLLED WITH YOUR NAUSEA MEDICATION  *UNUSUAL SHORTNESS OF BREATH  *UNUSUAL BRUISING OR BLEEDING  TENDERNESS IN MOUTH AND THROAT WITH OR WITHOUT PRESENCE OF ULCERS  *URINARY PROBLEMS  *BOWEL PROBLEMS  UNUSUAL RASH Items with * indicate a potential emergency and should be followed up as soon as possible.  Feel free to call the clinic you have any questions or concerns. The clinic phone number is [phone].  

## 2018-05-17 ENCOUNTER — Telehealth: Payer: Self-pay | Admitting: Hematology

## 2018-05-17 MED FILL — oxyCODONE HCL 10 MG TABS: 10 | 15 days supply | Qty: 90 | Fill #0

## 2018-05-17 NOTE — Telephone Encounter (Signed)
Appointments scheduled patient notified and he will  Pick up new schedule on next visit/ per 5/20 los

## 2018-05-18 ENCOUNTER — Inpatient Hospital Stay: Payer: Medicaid Other

## 2018-05-18 VITALS — BP 128/78 | HR 91 | Temp 98.2°F | Resp 17

## 2018-05-18 DIAGNOSIS — Z5111 Encounter for antineoplastic chemotherapy: Secondary | ICD-10-CM | POA: Diagnosis not present

## 2018-05-18 DIAGNOSIS — C189 Malignant neoplasm of colon, unspecified: Secondary | ICD-10-CM

## 2018-05-18 MED ORDER — SODIUM CHLORIDE 0.9% FLUSH
10.0000 mL | INTRAVENOUS | Status: DC | PRN
  Administered 2018-05-18: 10 mL
  Filled 2018-05-18: qty 10

## 2018-05-18 MED ORDER — HEPARIN SOD (PORK) LOCK FLUSH 100 UNIT/ML IV SOLN
500.0000 [IU] | Freq: Once | INTRAVENOUS | Status: AC | PRN
  Administered 2018-05-18: 500 [IU]
  Filled 2018-05-18: qty 5

## 2018-05-18 NOTE — Patient Instructions (Signed)

## 2018-05-30 ENCOUNTER — Ambulatory Visit: Payer: Self-pay

## 2018-05-30 ENCOUNTER — Telehealth: Payer: Self-pay

## 2018-05-30 ENCOUNTER — Inpatient Hospital Stay: Payer: Medicaid Other

## 2018-05-30 ENCOUNTER — Inpatient Hospital Stay: Payer: Medicaid Other | Admitting: Hematology

## 2018-05-30 ENCOUNTER — Telehealth: Payer: Self-pay | Admitting: Hematology

## 2018-05-30 NOTE — Telephone Encounter (Signed)
patieint called due to transportation trouble wanting to cancel. Per 6/3 phone qwue

## 2018-05-30 NOTE — Telephone Encounter (Signed)
Rescheduled appts to tomorrow - infusion is ok per charge. Waiting to add follow-up until desk nurse gives me a specific time due to lack of availability. Left voicemail for pt's daughter re appts for tomorrow.

## 2018-05-31 ENCOUNTER — Inpatient Hospital Stay: Payer: Medicaid Other | Attending: Hematology

## 2018-05-31 ENCOUNTER — Inpatient Hospital Stay: Payer: Medicaid Other

## 2018-05-31 DIAGNOSIS — Z5111 Encounter for antineoplastic chemotherapy: Secondary | ICD-10-CM | POA: Insufficient documentation

## 2018-05-31 DIAGNOSIS — Z86718 Personal history of other venous thrombosis and embolism: Secondary | ICD-10-CM | POA: Insufficient documentation

## 2018-05-31 DIAGNOSIS — C186 Malignant neoplasm of descending colon: Secondary | ICD-10-CM | POA: Insufficient documentation

## 2018-05-31 DIAGNOSIS — Z933 Colostomy status: Secondary | ICD-10-CM | POA: Insufficient documentation

## 2018-05-31 DIAGNOSIS — Z9221 Personal history of antineoplastic chemotherapy: Secondary | ICD-10-CM | POA: Insufficient documentation

## 2018-05-31 DIAGNOSIS — G893 Neoplasm related pain (acute) (chronic): Secondary | ICD-10-CM | POA: Insufficient documentation

## 2018-05-31 DIAGNOSIS — D630 Anemia in neoplastic disease: Secondary | ICD-10-CM | POA: Insufficient documentation

## 2018-05-31 DIAGNOSIS — M25511 Pain in right shoulder: Secondary | ICD-10-CM | POA: Insufficient documentation

## 2018-05-31 DIAGNOSIS — C786 Secondary malignant neoplasm of retroperitoneum and peritoneum: Secondary | ICD-10-CM | POA: Insufficient documentation

## 2018-05-31 DIAGNOSIS — Z7901 Long term (current) use of anticoagulants: Secondary | ICD-10-CM | POA: Insufficient documentation

## 2018-06-01 ENCOUNTER — Telehealth: Payer: Self-pay | Admitting: *Deleted

## 2018-06-01 ENCOUNTER — Telehealth: Payer: Self-pay | Admitting: Hematology

## 2018-06-01 NOTE — Telephone Encounter (Signed)
I called back to be sure that I spoke directly with patient regarding added infusion appointment for 6/6 per 6/5 sch msg

## 2018-06-01 NOTE — Telephone Encounter (Signed)
Daughter called requesting to reschedule pt's appts for chemo.  Noted that pt was NO SHOW on 05/31/18.  Spoke with pt and was informed that pt did not listen to voice mail until late yesterday;  therefore , pt missed apps. Informed pt that a scheduler will contact pt with new appts. Pt also requested refill of Oxycodone 10 mg - pt uses [facility]. Schedule message sent.

## 2018-06-01 NOTE — Telephone Encounter (Signed)
Appointments moved per 6/4 phone message from patient and patient notified

## 2018-06-01 NOTE — Telephone Encounter (Signed)
Appointments scheduled/ moved from 6/10 and patient notified via voicemail per 6/5 sch msg

## 2018-06-02 ENCOUNTER — Other Ambulatory Visit: Payer: Self-pay | Admitting: Hematology

## 2018-06-02 ENCOUNTER — Inpatient Hospital Stay: Payer: Medicaid Other

## 2018-06-02 ENCOUNTER — Telehealth: Payer: Self-pay

## 2018-06-02 ENCOUNTER — Other Ambulatory Visit: Payer: Self-pay | Admitting: Nurse Practitioner

## 2018-06-02 ENCOUNTER — Telehealth: Payer: Self-pay | Admitting: *Deleted

## 2018-06-02 ENCOUNTER — Inpatient Hospital Stay: Payer: Medicaid Other | Admitting: Nurse Practitioner

## 2018-06-02 DIAGNOSIS — C189 Malignant neoplasm of colon, unspecified: Secondary | ICD-10-CM

## 2018-06-02 MED ORDER — OXYCODONE HCL 10 MG PO TABS: 10.0000 mg | ORAL_TABLET | ORAL | 0 refills | Status: DC | PRN

## 2018-06-02 MED FILL — oxyCODONE HCL 10 MG TABS: 10 | 15 days supply | Qty: 90 | Fill #0

## 2018-06-02 NOTE — Telephone Encounter (Signed)
"  Dates are messed up.  I am scheduled Monday but someone called saying I was supposed to be there today.  I have a paper that says I am to come in Monday.  I spoke with someone yesterday about coming in Monday.  Now what am I supposed to do?"  Transferred to scheduling to reschedule.

## 2018-06-02 NOTE — Telephone Encounter (Signed)
His last FOLFIRI was on 5/20, he is overdue for chemo.   [name], please check and schedule lab, flush, f/u and chemo ASAP, cancel his appointment on 6/8.   [name] MD

## 2018-06-02 NOTE — Telephone Encounter (Signed)
Called patient's daughter [name] and informed patient did not come for all his appointments today, when called the patient states he is coming on Monday 6/10.  Explained to her that he cannot come on Monday as we are already at capacity.  I have sent a scheduling message to reschedule for one day next week and call the daughter with appointments. She assures me she will make sure he comes.

## 2018-06-04 ENCOUNTER — Inpatient Hospital Stay: Payer: Medicaid Other

## 2018-06-06 ENCOUNTER — Telehealth: Payer: Self-pay | Admitting: Hematology

## 2018-06-06 ENCOUNTER — Other Ambulatory Visit: Payer: Medicaid Other

## 2018-06-06 ENCOUNTER — Ambulatory Visit: Payer: Self-pay

## 2018-06-06 ENCOUNTER — Ambulatory Visit: Payer: Self-pay | Admitting: Nurse Practitioner

## 2018-06-06 NOTE — Telephone Encounter (Signed)
Called pt's daughter re appts that were added per 6/6 sch msg - left vm with appointments.

## 2018-06-07 MED FILL — ONDANSETRON HCL 8 MG TABLET: 8 | 15 days supply | Qty: 30 | Fill #1
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
## 2018-06-08 ENCOUNTER — Telehealth: Payer: Self-pay | Admitting: Nurse Practitioner

## 2018-06-08 ENCOUNTER — Inpatient Hospital Stay: Payer: Medicaid Other

## 2018-06-08 ENCOUNTER — Encounter: Payer: Self-pay | Admitting: Nurse Practitioner

## 2018-06-08 ENCOUNTER — Inpatient Hospital Stay (HOSPITAL_BASED_OUTPATIENT_CLINIC_OR_DEPARTMENT_OTHER): Payer: Medicaid Other | Admitting: Nurse Practitioner

## 2018-06-08 VITALS — BP 118/82 | HR 89 | Temp 98.5°F | Resp 18 | Ht 69.0 in | Wt 129.9 lb

## 2018-06-08 DIAGNOSIS — Z7901 Long term (current) use of anticoagulants: Secondary | ICD-10-CM | POA: Diagnosis not present

## 2018-06-08 DIAGNOSIS — D500 Iron deficiency anemia secondary to blood loss (chronic): Secondary | ICD-10-CM

## 2018-06-08 DIAGNOSIS — Z9221 Personal history of antineoplastic chemotherapy: Secondary | ICD-10-CM

## 2018-06-08 DIAGNOSIS — Z86718 Personal history of other venous thrombosis and embolism: Secondary | ICD-10-CM

## 2018-06-08 DIAGNOSIS — Z5111 Encounter for antineoplastic chemotherapy: Secondary | ICD-10-CM | POA: Diagnosis not present

## 2018-06-08 DIAGNOSIS — C186 Malignant neoplasm of descending colon: Secondary | ICD-10-CM

## 2018-06-08 DIAGNOSIS — C786 Secondary malignant neoplasm of retroperitoneum and peritoneum: Secondary | ICD-10-CM

## 2018-06-08 DIAGNOSIS — D630 Anemia in neoplastic disease: Secondary | ICD-10-CM | POA: Diagnosis not present

## 2018-06-08 DIAGNOSIS — Z933 Colostomy status: Secondary | ICD-10-CM

## 2018-06-08 DIAGNOSIS — C762 Malignant neoplasm of abdomen: Secondary | ICD-10-CM

## 2018-06-08 DIAGNOSIS — M25511 Pain in right shoulder: Secondary | ICD-10-CM | POA: Diagnosis not present

## 2018-06-08 DIAGNOSIS — G893 Neoplasm related pain (acute) (chronic): Secondary | ICD-10-CM | POA: Diagnosis not present

## 2018-06-08 DIAGNOSIS — Z95828 Presence of other vascular implants and grafts: Secondary | ICD-10-CM

## 2018-06-08 DIAGNOSIS — C189 Malignant neoplasm of colon, unspecified: Secondary | ICD-10-CM

## 2018-06-08 DIAGNOSIS — R197 Diarrhea, unspecified: Secondary | ICD-10-CM

## 2018-06-08 LAB — CBC WITH DIFFERENTIAL/PLATELET
Basophils Absolute: 0.1 10*3/uL (ref 0.0–0.1)
Basophils Relative: 1 %
EOS PCT: 3 %
Eosinophils Absolute: 0.1 10*3/uL (ref 0.0–0.5)
HCT: 34.9 % — ABNORMAL LOW (ref 38.4–49.9)
Hemoglobin: 11.5 g/dL — ABNORMAL LOW (ref 13.0–17.1)
LYMPHS ABS: 1.1 10*3/uL (ref 0.9–3.3)
LYMPHS PCT: 22 %
MCH: 29.6 pg (ref 27.2–33.4)
MCHC: 32.9 g/dL (ref 32.0–36.0)
MCV: 90 fL (ref 79.3–98.0)
MONO ABS: 0.7 10*3/uL (ref 0.1–0.9)
Monocytes Relative: 12 %
Neutro Abs: 3.3 10*3/uL (ref 1.5–6.5)
Neutrophils Relative %: 62 %
Platelets: 151 10*3/uL (ref 140–400)
RBC: 3.87 MIL/uL — AB (ref 4.20–5.82)
RDW: 16.9 % — AB (ref 11.0–14.6)
WBC: 5.3 10*3/uL (ref 4.0–10.3)

## 2018-06-08 LAB — COMPREHENSIVE METABOLIC PANEL
ALBUMIN: 3.8 g/dL (ref 3.5–5.0)
ALT: <6 U/L (ref 0–55)
AST: 22 U/L (ref 5–34)
Alkaline Phosphatase: 71 U/L (ref 40–150)
Anion gap: 10 (ref 3–11)
BUN: 6 mg/dL — AB (ref 7–26)
CHLORIDE: 106 mmol/L (ref 98–109)
CO2: 24 mmol/L (ref 22–29)
Calcium: 9.5 mg/dL (ref 8.4–10.4)
Creatinine, Ser: 0.74 mg/dL (ref 0.70–1.30)
GFR calc Af Amer: >60 mL/min (ref >60)
GFR calc non Af Amer: >60 mL/min (ref >60)
GLUCOSE: 86 mg/dL (ref 70–140)
Potassium: 3.7 mmol/L (ref 3.5–5.1)
SODIUM: 140 mmol/L (ref 136–145)
Total Bilirubin: 0.4 mg/dL (ref 0.2–1.2)
Total Protein: 8.1 g/dL (ref 6.4–8.3)

## 2018-06-08 LAB — MAGNESIUM: MAGNESIUM: 1.8 mg/dL (ref 1.7–2.4)

## 2018-06-08 MED ORDER — ATROPINE SULFATE 1 MG/ML IJ SOLN
INTRAMUSCULAR | Status: AC
  Filled 2018-06-08: qty 1

## 2018-06-08 MED ORDER — LEUCOVORIN CALCIUM INJECTION 350 MG
400.0000 mg/m2 | Freq: Once | INTRAMUSCULAR | Status: AC
  Administered 2018-06-08: 660 mg via INTRAVENOUS
  Filled 2018-06-08: qty 33

## 2018-06-08 MED ORDER — DEXAMETHASONE SODIUM PHOSPHATE 10 MG/ML IJ SOLN
INTRAMUSCULAR | Status: AC
  Filled 2018-06-08: qty 1

## 2018-06-08 MED ORDER — PALONOSETRON HCL INJECTION 0.25 MG/5ML
INTRAVENOUS | Status: AC
  Filled 2018-06-08: qty 5

## 2018-06-08 MED ORDER — IRINOTECAN HCL CHEMO INJECTION 100 MG/5ML
120.0000 mg/m2 | Freq: Once | INTRAVENOUS | Status: AC
  Administered 2018-06-08: 200 mg via INTRAVENOUS
  Filled 2018-06-08: qty 10

## 2018-06-08 MED ORDER — PALONOSETRON HCL INJECTION 0.25 MG/5ML
0.2500 mg | Freq: Once | INTRAVENOUS | Status: AC
  Administered 2018-06-08: 0.25 mg via INTRAVENOUS

## 2018-06-08 MED ORDER — SODIUM CHLORIDE 0.9 % IV SOLN
Freq: Once | INTRAVENOUS | Status: AC
  Administered 2018-06-08: 10:00:00 via INTRAVENOUS

## 2018-06-08 MED ORDER — MAGNESIUM OXIDE 400 (241.3 MG) MG PO TABS: 400.0000 mg | ORAL_TABLET | Freq: Every day | ORAL | 0 refills | Status: DC

## 2018-06-08 MED ORDER — ATROPINE SULFATE 1 MG/ML IJ SOLN
0.5000 mg | Freq: Once | INTRAMUSCULAR | Status: AC | PRN
  Administered 2018-06-08: 0.5 mg via INTRAVENOUS

## 2018-06-08 MED ORDER — SODIUM CHLORIDE 0.9% FLUSH
10.0000 mL | INTRAVENOUS | Status: DC | PRN
  Administered 2018-06-08: 10 mL via INTRAVENOUS
  Filled 2018-06-08: qty 10

## 2018-06-08 MED ORDER — FLUOROURACIL CHEMO INJECTION 5 GM/100ML
2200.0000 mg/m2 | INTRAVENOUS | Status: DC
  Administered 2018-06-08: 3650 mg via INTRAVENOUS
  Filled 2018-06-08: qty 73

## 2018-06-08 MED ORDER — DEXAMETHASONE SODIUM PHOSPHATE 10 MG/ML IJ SOLN
10.0000 mg | Freq: Once | INTRAMUSCULAR | Status: AC
  Administered 2018-06-08: 10 mg via INTRAVENOUS

## 2018-06-08 NOTE — Telephone Encounter (Signed)
No LOS 6/12

## 2018-06-08 NOTE — Patient Instructions (Signed)
[facility] Discharge Instructions for Patients Receiving Chemotherapy  Today you received the following chemotherapy agents:  Irinotecan, Leucovorin, and 5FU.   To help prevent nausea and vomiting after your treatment, we encourage you to take your nausea medication as directed.   If you develop nausea and vomiting that is not controlled by your nausea medication, call the clinic.   BELOW ARE SYMPTOMS THAT SHOULD BE REPORTED IMMEDIATELY:  *FEVER GREATER THAN 100.5 F  *CHILLS WITH OR WITHOUT FEVER  NAUSEA AND VOMITING THAT IS NOT CONTROLLED WITH YOUR NAUSEA MEDICATION  *UNUSUAL SHORTNESS OF BREATH  *UNUSUAL BRUISING OR BLEEDING  TENDERNESS IN MOUTH AND THROAT WITH OR WITHOUT PRESENCE OF ULCERS  *URINARY PROBLEMS  *BOWEL PROBLEMS  UNUSUAL RASH Items with * indicate a potential emergency and should be followed up as soon as possible.  Feel free to call the clinic should you have any questions or concerns. The clinic phone number is [phone].  Please show the CHEMO ALERT CARD at check-in to the Emergency Department and triage nurse.   

## 2018-06-10 ENCOUNTER — Inpatient Hospital Stay: Payer: Medicaid Other

## 2018-06-10 VITALS — BP 128/74 | HR 72 | Temp 98.6°F | Resp 17

## 2018-06-10 DIAGNOSIS — Z5111 Encounter for antineoplastic chemotherapy: Secondary | ICD-10-CM | POA: Diagnosis not present

## 2018-06-10 DIAGNOSIS — C189 Malignant neoplasm of colon, unspecified: Secondary | ICD-10-CM

## 2018-06-10 MED ORDER — HEPARIN SOD (PORK) LOCK FLUSH 100 UNIT/ML IV SOLN
500.0000 [IU] | Freq: Once | INTRAVENOUS | Status: AC | PRN
  Administered 2018-06-10: 500 [IU]
  Filled 2018-06-10: qty 5

## 2018-06-10 MED ORDER — SODIUM CHLORIDE 0.9% FLUSH
10.0000 mL | INTRAVENOUS | Status: DC | PRN
  Administered 2018-06-10: 10 mL
  Filled 2018-06-10: qty 10

## 2018-06-14 ENCOUNTER — Ambulatory Visit: Payer: Self-pay | Admitting: Nurse Practitioner

## 2018-06-14 ENCOUNTER — Other Ambulatory Visit: Payer: Self-pay

## 2018-06-14 ENCOUNTER — Ambulatory Visit: Payer: Self-pay

## 2018-06-16 NOTE — Progress Notes (Signed)
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 1. 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 However, there is now increasing thrombus within the left iliac and femoral veins with adjacent inflammation. 2. Mild circumferential bladder wall thickening which may be reactive or could indicate cystitis. Correlate clinically. 3. Unchanged complex cystic mass in the splenic hilum and focal wall thickening of the splenic flexure. 4.  Aortic Atherosclerosis (ICD10-I70.0).  CT CAP W Contrast 11/25/17 IMPRESSION: 1. Response to therapy of intraperitoneal metastasis, including within the splenic hilum. 2.  No acute process or evidence of metastatic disease in the chest. 3. Persistent splenic flexure colonic wall thickening, with right-sided colostomy in place. 4.  Aortic Atherosclerosis (ICD10-I70.0).  CT AP W Contrast 08/15/17 IMPRESSION: 1. Early or new small bowel obstruction with a transition point in the right abdomen as above. The adjacent swirling vessels are most consistent with an internal hernia. 2. The mass involving the pancreatic tail and spleen is similar in the  interval. 3. The previously identified abscess inferior to the pancreatic mass has resolved with minimal fluid in this region. The tube is been removed. 4. Continued thickening of the colon as above consistent with the patient's known malignancy. 5. Atherosclerotic changes in the aorta. Aortic Atherosclerosis (ICD10-I70.0).   CT AP W Contrast 07/29/17 IMPRESSION: 1. Bowel obstruction at the level of the proximal descending colon at the location of a percutaneous drainage catheter. This could be secondary to wall thickening by the adjacent inflammatory process. Wall thickening due to colon neoplasm also remains a possibility. 2. Dilated, fluid-filled appendix, similar to the dilated loops of colon and small bowel, compatible with dilatation due to the bowel obstruction. There are no findings to indicate appendicitis. 3. Small amount of free peritoneal fluid. 4. The percutaneously drained fluid collection in the left mid abdomen has with resolved. 5. Mild decrease in size of the multiloculated fluid collection in the splenic hilum and distal tail of the pancreas. 6. Stable 8 mm diffusely enhancing mass in the dome of the liver on the right. This is most likely benign. A solitary enhancing metastasis is less likely. 7. Moderate prostatic hypertrophy.  CT CP W Contrast 07/10/17 IMPRESSION: The salient finding is a new abscess measuring 3.8 x 4.6 cm inferior to the pancreatic tail and spleen.  CT CAP w contrast 07/05/2017  IMPRESSION: 1. Today's study demonstrates some positive response to therapy with decreased size of mass associated with the descending colon, decreased size of the invasive mass extending from the tail of the pancreas into the splenic hilum and decreased size of adjacent peritoneal lesions. There has also been regression of upper abdominal lymphadenopathy. 2. No new pulmonary or hepatic lesions identified to suggest progressive metastatic disease. 3. Aortic  atherosclerosis. 4. Additional incidental findings, as above.  CT CAP w Contrast 04/09/2017 IMPRESSION: 1. Response to therapy with decreased peritoneal tumor volume throughout the abdomen. 2. No well-defined residual colonic mass. No bowel obstruction or other acute complication. 3. Decrease in gastrohepatic ligament adenopathy. 4.  No new sites of disease. 5.  No acute process or evidence of metastatic disease in the chest. 6.  Aortic atherosclerosis. 7. Mild gynecomastia.  Colonoscopy 01/12/2017 Impression: - Malignant near obstructing tumor in the descending colon and at the splenic flexure. Biopsied. Tattooed. - One 25 mm polyp at the recto-sigmoid colon, removed with a hot snare. Resected and retrieved. Clips (MR unsafe) were placed.  Lower Extremity Ultrasound 02/16/17 Bilateral lower extremity venous duplex complete. There is evidence of deep vein thrombosis involving the femoral, popliteal, and peroneal veins of the right lower extremity.  There is no evidence of superficial vein thrombosis involving the right lower extremity. There is evidence of superficial vein thrombosis involving the lesser saphenous vein of the left lower extremity. There is no evidence of deep vein thrombosis involving the left lower extremity. There is no evidence of a Baker's cyst bilaterally.  ASSESSMENT & PLAN: Jeff [name] is a 57 y.o.  African-American male, without a significant past medical history, no routine medical care, presented with intermittent rectal bleeding, anemia  and worsening abdominal pain for 3 years.   1. 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  -His case was previously reviewed in our GI tumor Board, unfortunately he has peritoneum metastasis, with a large 11 cm peritoneal metastasis in  the left upper quadrant, which is consistent with peritoneal metastasis. -We previously reviewed the natural history of metastatic colon cancer. Unfortunately his cancer is incurable at this stage. We discussed the goal of therapy is palliative, to prolong his life and improve his quality of life. -He has started first line chemo FOLFOX. Due to history of pancytopenia, I'll skip the 5-FU bolus. -I previously discussed the [facility] genomic test result, which showed wild type KRAS, NRAS, and BRAF, MSI-stable. Based on this and his left-sided colon cancer, he will significantly benefit from EGFR inhibitor. He has started on panitumumab and has been tolerating well. -I previously reviewed the CT scan from 04/09/17 with the patient in detail. He has had excellent partial response to the chemotherapy treatment, no other new lesions, we'll continue current chemotherapy regimen. -I reviewed his restaging CT scan from 07/05/2017, which showed a continuous response, no new lesions. We'll continue current treatment. -He previously developed a moderately thrombocytopenia, chemotherapy dose was reduced  --Due to Pancreatic Abscess,  His recent hospitalization, chemotherapy has been held. - he has  Being recovering well from his recent hospitalization, still in rehabilitation, but ambulates  Without any difficulty, he is also eating better. - due to his recent thrombosis and GI bleeding, he is not a candidate for Avastin for now. Given questionable disease progression, I recommend him to continue panitumumab for now - due to his recent bowel obstruction, I think he probably has had disease progression, although scan showed stable disease . I recommend to switch his chemotherapy to irinotecan alone, or FOLFIRI if he is able to tolerate.  - the goal of therapy is palliative. - we again discussed the incurable nature of his cancer, an overall poor prognosis. The goal of therapy is palliative. -Started irinotecan  and panitumumab on 09/21/17. Tolerated well with increased ostomy output. Added 5-fu back with cycle 2 on 10/04/17 and reduced FOLFIRI due to overall limited PS. Increased to full dose FOLFIRI on 10/18/17, he tolerated well overall with mild to moderate diarrhea. -encouraged him to  manage his diarrhea with imodium and lomotil  -I suggest he increase his Lomotil to up to 6-8 tablets daily if he experiences significant diarrhea again.   -We previously discussed his CT CAP on 11/25/17 that indicates response to therapy of intraperitoneal metastasis including within the splenic hilum; no evidence of metastatic disease in the chest -Pt did not tolerate the cycle 5 FOLFIRI well, despite dose reduction on both agents.  He is very reluctant to continue same regiment. Given his restaging CT scan showed mixed response, but overall stable disease,  I will hold Irinotecan, and continue 5-FU/leucovorin and Panitumumab as maintenance therapy. He agreed and started on 11/29/17 -he has tolerated maintenance therapy well, without significant diarrhea.  -During his episode of extensive left lower extremity DVT on 01/25/18, his treatment was held.  Chemo treatment restarted after he recovered from DVT -He had a CT abdomen and pelvis without contrast when he was in the hospital on January 08, 2018, which showed overall stable peritoneal metastasis. -Pt did not want to proceed with chemo on 02/21/18 due to feeling down and cold symptoms.  -CT CAP W Contrast  From 03/31/18 revealed disease progression of the splenic hilum mass, now 7.7 cm from 5.2 cm. -He has restarted FOLFIRI, I do not think he is a candidate for Avastin.  Vectibix was stopped due to disease progression  -I previously discussed the clinical trial options at [facility], especially [id number], Phase II single-arm study of palbociclib and cetuximab in RAS/RAF wildtype metastatic CRC. He is interested. -He is tolerating dose reduced FOLFIRI well, no worsening  diarrhea, it is manageable. -Lab reviewed, adequate for treatment, will proceed cycle 5 FOLFIRI today -Restaging CT chest, abdomen and pelvis with contrast next week -Her tumor marker CA has been trending up, he also developed some right shoulder pain, concerning for disease progression.    2. History of right LE DVT, and extensive LLE DVT  -I previously reviewed his ultrasound results from 02/17/17, which showed DVT involving the femoral, popliteal, and peritoneal vein of the right lower extremity from 2/22. -He had history of right lower extremity DVT after surgery before  - his Xarelto has been held due to GI bleeding he has a IVC placed -No recurrence of bleeding since IVC placement.  -Pt was admitted to the hospital on 12/30/17 and discharged on 01/11/18. He presented with progressively worsening pain and swelling in the left leg. Iimaging studies notable for worsening left LE DVT. Initially hesitant to start anticoagulation due to known history of GI bleed while on Xarelto. Started on Eliquis and transferred to [facility] for thrombectomy. His left leg edema resolved after thrombectomy, he was discharged with Xarelto (he refused lovenox) '15mg'$  bid -He noticed recurrent GI bleeding around 01/25/2018, resolved after holding Xarelto.  Has restarted Xarelto at 20 mg daily dose, tolerating well without bleeding. -I will refill his Xarelto. He is on 15 mg daily, dose reduced due to her previous severe GI bleeding.  3. 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 -- continue oxycodone as needed, pain controlled  -Has also developed some back pain in the past few weeks, related to chemo.  Using oxycodone for the pain. -I called in Neurontin '100mg'$  for his shooting leg pain (11/15/17). He can start low dose with 1 tab at night and increase up to 3 tablets in the evening if he can tolerate it.  -pain controlled overall, he is on oxycodone '10mg'$  4-5  tab daily   -I advised him to take his Neurontin at night, he can take 200 mg for the next few weeks, and I changed dose to 300 mg 3 times a day for next refill.   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 -He has received IV Feraheme twice, but his anemia has not improved. He likely has component of anemia of chronic disease secondary to his underlying malignancy. -Ferritin 196 on 10/04/17, WNL, Hgb 9.8; will continue to monitor  -Persistent anemia, Hg improved to 12.4 (04/25/18). Continue ferrous Sulfate, no need for blood transfusion today.  5. Weight loss, malnutrition, anorexia -Secondary to chemotherapy and cancer -The patient will continue to drink dietary supplements. -He has gained some weight lately - I again strongly encouraged him to try to eat more to avoid losing excess weight. I again recommended he drink 2 ensure or boost supplements a day to help. -I previously prescribed mirtazapine on 05/25/17 in attempts to help his appetite. He knows to take it once every evening. - continue follow-up with dietitian, previous f/u 10/22 -His weight is stable overall lately   6. History of pancreatic Abscess -Presented to hospital on 07/09/17 for abdominal pain. Based on SP CT from 07/10/17 he found to have an abscess. Draining tube placed and he received prolonged course of antibiotics  - His percutaneous drainage tube has been removed  -this has resolved   7. Bowel obstruction due to his colon mass at the splenic flexure and surrounding inflammation due to recent abscess, s/p diverting colostomy  -He was hospitalized on 07/29/17 for SBO  -He had sigmoidoscopy and loop Colostomy and has a J tube placed.  - he is tolerating diet well now. -Bowel movements overall have been normal, but sometimes he has softer stools and are more loose.  -I again reviewed the usage of both imodium and colace and in what context he should be using them  both for his stool consistencies.  -He has colostomy prolapse, he knows to avoid lifting and carry heavy things -He will see his surgeon early 10/2017  -Diarrhea controlled  -He has loose to watery stools which improves a few days after chemo. I prescribed lomotil on 11/01/17. He should take imodium first and if not enough he should take lomotil to control his diarrhea.   8. Diarrhea  -Second to chemotherapy -Recommend him to take Lomotil 1 to 2 tablets 3 times daily before each meals, and use Imodium 2 tablets every 6-8 hours as needed in between  9. Goal of care discussion  -We previously discussed the incurable nature of his cancer, and the overall poor prognosis, especially if he does not have good response to chemotherapy or progress on chemo -The patient understands the goal of care is palliative. -I have previously recommended DNR/DNI, he will think about it.    Plan: -Lab reviewed, adequate for treatment, will proceed FOLFIRI today and continue every 2 weeks -Restaging CT CAP with contrast in 1-2 weeks before next f/u and chemo in 2 weeks  -I refilled oxycodone, mirtazapine, Neurontin, potassium for him today.  All questions were answered. The patient knows to call the clinic with any problems, questions or concerns.  I spent 20 minutes counseling the patient face to face. The total time spent in the appointment was 25 minutes and more than 50% was on counseling.  Dierdre Searles Dweik am acting as scribe for Dr. [name].  I have  reviewed the above documentation for accuracy and completeness, and I agree with the above.    [name], MD 06/20/18

## 2018-06-20 ENCOUNTER — Inpatient Hospital Stay: Payer: Medicaid Other

## 2018-06-20 ENCOUNTER — Encounter: Payer: Self-pay | Admitting: Hematology

## 2018-06-20 ENCOUNTER — Telehealth: Payer: Self-pay | Admitting: Hematology

## 2018-06-20 ENCOUNTER — Inpatient Hospital Stay: Payer: Medicaid Other | Admitting: Nutrition

## 2018-06-20 ENCOUNTER — Inpatient Hospital Stay (HOSPITAL_BASED_OUTPATIENT_CLINIC_OR_DEPARTMENT_OTHER): Payer: Medicaid Other | Admitting: Hematology

## 2018-06-20 VITALS — BP 136/90 | HR 93 | Temp 98.5°F | Resp 18 | Wt 123.1 lb

## 2018-06-20 DIAGNOSIS — C786 Secondary malignant neoplasm of retroperitoneum and peritoneum: Secondary | ICD-10-CM

## 2018-06-20 DIAGNOSIS — Z7901 Long term (current) use of anticoagulants: Secondary | ICD-10-CM

## 2018-06-20 DIAGNOSIS — Z5111 Encounter for antineoplastic chemotherapy: Secondary | ICD-10-CM | POA: Diagnosis not present

## 2018-06-20 DIAGNOSIS — D630 Anemia in neoplastic disease: Secondary | ICD-10-CM | POA: Diagnosis not present

## 2018-06-20 DIAGNOSIS — C186 Malignant neoplasm of descending colon: Secondary | ICD-10-CM

## 2018-06-20 DIAGNOSIS — Z86718 Personal history of other venous thrombosis and embolism: Secondary | ICD-10-CM | POA: Diagnosis not present

## 2018-06-20 DIAGNOSIS — G893 Neoplasm related pain (acute) (chronic): Secondary | ICD-10-CM | POA: Diagnosis not present

## 2018-06-20 DIAGNOSIS — M25511 Pain in right shoulder: Secondary | ICD-10-CM

## 2018-06-20 DIAGNOSIS — C189 Malignant neoplasm of colon, unspecified: Secondary | ICD-10-CM

## 2018-06-20 DIAGNOSIS — D509 Iron deficiency anemia, unspecified: Secondary | ICD-10-CM

## 2018-06-20 DIAGNOSIS — C762 Malignant neoplasm of abdomen: Secondary | ICD-10-CM

## 2018-06-20 DIAGNOSIS — D500 Iron deficiency anemia secondary to blood loss (chronic): Secondary | ICD-10-CM

## 2018-06-20 DIAGNOSIS — Z933 Colostomy status: Secondary | ICD-10-CM | POA: Diagnosis not present

## 2018-06-20 DIAGNOSIS — Z95828 Presence of other vascular implants and grafts: Secondary | ICD-10-CM

## 2018-06-20 DIAGNOSIS — Z9221 Personal history of antineoplastic chemotherapy: Secondary | ICD-10-CM | POA: Diagnosis not present

## 2018-06-20 DIAGNOSIS — E876 Hypokalemia: Secondary | ICD-10-CM

## 2018-06-20 LAB — COMPREHENSIVE METABOLIC PANEL
ALT: 6 U/L (ref 0–55)
AST: 24 U/L (ref 5–34)
Albumin: 3.9 g/dL (ref 3.5–5.0)
Alkaline Phosphatase: 79 U/L (ref 40–150)
Anion gap: 11 (ref 3–11)
BUN: 11 mg/dL (ref 7–26)
CO2: 22 mmol/L (ref 22–29)
CREATININE: 0.87 mg/dL (ref 0.70–1.30)
Calcium: 9.6 mg/dL (ref 8.4–10.4)
Chloride: 106 mmol/L (ref 98–109)
Glucose, Bld: 78 mg/dL (ref 70–140)
Potassium: 4 mmol/L (ref 3.5–5.1)
Sodium: 139 mmol/L (ref 136–145)
Total Bilirubin: 0.3 mg/dL (ref 0.2–1.2)
Total Protein: 8.3 g/dL (ref 6.4–8.3)

## 2018-06-20 LAB — CBC WITH DIFFERENTIAL/PLATELET
BASOS ABS: 0 10*3/uL (ref 0.0–0.1)
BASOS PCT: 1 %
EOS ABS: 0.1 10*3/uL (ref 0.0–0.5)
EOS PCT: 2 %
HCT: 35.8 % — ABNORMAL LOW (ref 38.4–49.9)
HEMOGLOBIN: 11.8 g/dL — AB (ref 13.0–17.1)
Lymphocytes Relative: 22 %
Lymphs Abs: 1.3 10*3/uL (ref 0.9–3.3)
MCH: 29.9 pg (ref 27.2–33.4)
MCHC: 32.8 g/dL (ref 32.0–36.0)
MCV: 91 fL (ref 79.3–98.0)
Monocytes Absolute: 0.6 10*3/uL (ref 0.1–0.9)
Monocytes Relative: 10 %
Neutro Abs: 4 10*3/uL (ref 1.5–6.5)
Neutrophils Relative %: 65 %
PLATELETS: 167 10*3/uL (ref 140–400)
RBC: 3.94 MIL/uL — ABNORMAL LOW (ref 4.20–5.82)
RDW: 16.6 % — ABNORMAL HIGH (ref 11.0–14.6)
WBC: 6.1 10*3/uL (ref 4.0–10.3)

## 2018-06-20 LAB — MAGNESIUM: MAGNESIUM: 1.7 mg/dL (ref 1.7–2.4)

## 2018-06-20 LAB — CEA (IN HOUSE-CHCC): CEA (CHCC-In House): 13.06 ng/mL — ABNORMAL HIGH (ref 0.00–5.00)

## 2018-06-20 LAB — FERRITIN: FERRITIN: 194 ng/mL (ref 22–316)

## 2018-06-20 MED ORDER — ATROPINE SULFATE 1 MG/ML IJ SOLN
0.5000 mg | Freq: Once | INTRAMUSCULAR | Status: AC | PRN
  Administered 2018-06-20: 0.5 mg via INTRAVENOUS

## 2018-06-20 MED ORDER — MIRTAZAPINE 15 MG PO TABS: 15.0000 mg | ORAL_TABLET | Freq: Every day | ORAL | 2 refills | Status: DC

## 2018-06-20 MED ORDER — FLUOROURACIL CHEMO INJECTION 5 GM/100ML
2200.0000 mg/m2 | INTRAVENOUS | Status: DC
  Administered 2018-06-20: 3650 mg via INTRAVENOUS
  Filled 2018-06-20: qty 73

## 2018-06-20 MED ORDER — GABAPENTIN 300 MG PO CAPS: 300.0000 mg | ORAL_CAPSULE | Freq: Three times a day (TID) | ORAL | 2 refills | Status: DC

## 2018-06-20 MED ORDER — DEXAMETHASONE SODIUM PHOSPHATE 10 MG/ML IJ SOLN
10.0000 mg | Freq: Once | INTRAMUSCULAR | Status: AC
  Administered 2018-06-20: 10 mg via INTRAVENOUS

## 2018-06-20 MED ORDER — PALONOSETRON HCL INJECTION 0.25 MG/5ML
0.2500 mg | Freq: Once | INTRAVENOUS | Status: AC
  Administered 2018-06-20: 0.25 mg via INTRAVENOUS

## 2018-06-20 MED ORDER — POTASSIUM CHLORIDE CRYS ER 20 MEQ PO TBCR: 20.0000 meq | EXTENDED_RELEASE_TABLET | Freq: Every day | ORAL | 2 refills | Status: DC

## 2018-06-20 MED ORDER — RIVAROXABAN 15 MG PO TABS: 15.0000 mg | ORAL_TABLET | Freq: Every day | ORAL | 3 refills | Status: DC

## 2018-06-20 MED ORDER — ATROPINE SULFATE 1 MG/ML IJ SOLN
INTRAMUSCULAR | Status: AC
  Filled 2018-06-20: qty 1

## 2018-06-20 MED ORDER — SODIUM CHLORIDE 0.9 % IV SOLN
Freq: Once | INTRAVENOUS | Status: AC
  Administered 2018-06-20: 12:00:00 via INTRAVENOUS

## 2018-06-20 MED ORDER — DEXAMETHASONE SODIUM PHOSPHATE 10 MG/ML IJ SOLN
INTRAMUSCULAR | Status: AC
  Filled 2018-06-20: qty 1

## 2018-06-20 MED ORDER — PALONOSETRON HCL INJECTION 0.25 MG/5ML
INTRAVENOUS | Status: AC
  Filled 2018-06-20: qty 5

## 2018-06-20 MED ORDER — LEUCOVORIN CALCIUM INJECTION 350 MG
400.0000 mg/m2 | Freq: Once | INTRAVENOUS | Status: AC
  Administered 2018-06-20: 660 mg via INTRAVENOUS
  Filled 2018-06-20: qty 33

## 2018-06-20 MED ORDER — IRINOTECAN HCL CHEMO INJECTION 100 MG/5ML
120.0000 mg/m2 | Freq: Once | INTRAVENOUS | Status: AC
  Administered 2018-06-20: 200 mg via INTRAVENOUS
  Filled 2018-06-20: qty 10

## 2018-06-20 MED ORDER — OXYCODONE HCL 10 MG PO TABS: 10.0000 mg | ORAL_TABLET | ORAL | 0 refills | Status: DC | PRN

## 2018-06-20 MED FILL — oxyCODONE HCL 10 MG TABS: 10 | 15 days supply | Qty: 90 | Fill #0

## 2018-06-20 MED FILL — MIRTAZAPINE 15 MG TABLET: 15 | 30 days supply | Qty: 30 | Fill #0

## 2018-06-20 MED FILL — GABAPENTIN 300 MG CAPSULE: 300 | 30 days supply | Qty: 90 | Fill #0

## 2018-06-20 MED FILL — XARELTO 15 MG TABLET: 15 | 30 days supply | Qty: 30 | Fill #0

## 2018-06-20 MED FILL — POTASSIUM CL ER 20 MEQ TABL: 20 | 30 days supply | Qty: 30 | Fill #0

## 2018-06-20 NOTE — Patient Instructions (Signed)
[facility] Discharge Instructions for Patients Receiving Chemotherapy  Today you received the following chemotherapy agents:  Irinotecan, Leucovorin, and 5FU.   To help prevent nausea and vomiting after your treatment, we encourage you to take your nausea medication as directed.   If you develop nausea and vomiting that is not controlled by your nausea medication, call the clinic.   BELOW ARE SYMPTOMS THAT SHOULD BE REPORTED IMMEDIATELY:  *FEVER GREATER THAN 100.5 F  *CHILLS WITH OR WITHOUT FEVER  NAUSEA AND VOMITING THAT IS NOT CONTROLLED WITH YOUR NAUSEA MEDICATION  *UNUSUAL SHORTNESS OF BREATH  *UNUSUAL BRUISING OR BLEEDING  TENDERNESS IN MOUTH AND THROAT WITH OR WITHOUT PRESENCE OF ULCERS  *URINARY PROBLEMS  *BOWEL PROBLEMS  UNUSUAL RASH Items with * indicate a potential emergency and should be followed up as soon as possible.  Feel free to call the clinic should you have any questions or concerns. The clinic phone number is [phone].  Please show the CHEMO ALERT CARD at check-in to the Emergency Department and triage nurse.   

## 2018-06-20 NOTE — Progress Notes (Signed)
Nutrition follow-up completed with patient during infusion for metastatic colon cancer. Patient denies nausea and vomiting. Reports stool consistency has improved. Weight decreased and was documented as 123.1 pounds June 24 down from 127 pounds April 22. Patient acknowledges weight loss. Requesting additional case of Ensure Plus.  Nutrition diagnosis: Inadequate oral intake continues.  Intervention: Provided 1 complementary case of Ensure Plus. Encourage patient to increase high-calorie high-protein foods. Teach back method used.  Monitoring, evaluation, goals: Patient will work to increase calories and protein to minimize weight loss.  Next visit: Monday, July 22 during infusion.  **Disclaimer: This note was dictated with voice recognition software. Similar sounding words can inadvertently be transcribed and this note may contain transcription errors which may not have been corrected upon publication of note.**

## 2018-06-20 NOTE — Patient Instructions (Signed)
Implanted Port Home Guide An implanted port is a type of central line that is placed under the skin. Central lines are used to provide IV access when treatment or nutrition needs to be given through a person's veins. Implanted ports are used for long-term IV access. An implanted port may be placed because:  You need IV medicine that would be irritating to the small veins in your hands or arms.  You need long-term IV medicines, such as antibiotics.  You need IV nutrition for a long period.  You need frequent blood draws for lab tests.  You need dialysis.  Implanted ports are usually placed in the chest area, but they can also be placed in the upper arm, the abdomen, or the leg. An implanted port has two main parts:  Reservoir. The reservoir is round and will appear as a small, raised area under your skin. The reservoir is the part where a needle is inserted to give medicines or draw blood.  Catheter. The catheter is a thin, flexible tube that extends from the reservoir. The catheter is placed into a large vein. Medicine that is inserted into the reservoir goes into the catheter and then into the vein.  How will I care for my incision site? Do not get the incision site wet. Bathe or shower as directed by your health care provider. How is my port accessed? Special steps must be taken to access the port:  Before the port is accessed, a numbing cream can be placed on the skin. This helps numb the skin over the port site.  Your health care provider uses a sterile technique to access the port. ? Your health care provider must put on a mask and sterile gloves. ? The skin over your port is cleaned carefully with an antiseptic and allowed to dry. ? The port is gently pinched between sterile gloves, and a needle is inserted into the port.  Only "non-coring" port needles should be used to access the port. Once the port is accessed, a blood return should be checked. This helps ensure that the port  is in the vein and is not clogged.  If your port needs to remain accessed for a constant infusion, a clear (transparent) bandage will be placed over the needle site. The bandage and needle will need to be changed every week, or as directed by your health care provider.  Keep the bandage covering the needle clean and dry. Do not get it wet. Follow your health care provider's instructions on how to take a shower or bath while the port is accessed.  If your port does not need to stay accessed, no bandage is needed over the port.  What is flushing? Flushing helps keep the port from getting clogged. Follow your health care provider's instructions on how and when to flush the port. Ports are usually flushed with saline solution or a medicine called heparin. The need for flushing will depend on how the port is used.  If the port is used for intermittent medicines or blood draws, the port will need to be flushed: ? After medicines have been given. ? After blood has been drawn. ? As part of routine maintenance.  If a constant infusion is running, the port may not need to be flushed.  How long will my port stay implanted? The port can stay in for as long as your health care provider thinks it is needed. When it is time for the port to come out, surgery will be   done to remove it. The procedure is similar to the one performed when the port was put in. When should I seek immediate medical care? When you have an implanted port, you should seek immediate medical care if:  You notice a bad smell coming from the incision site.  You have swelling, redness, or drainage at the incision site.  You have more swelling or pain at the port site or the surrounding area.  You have a fever that is not controlled with medicine.  This information is not intended to replace advice given to you by your health care provider. Make sure you discuss any questions you have with your health care provider. Document  Released: 12/14/2005 Document Revised: 05/21/2016 Document Reviewed: 08/21/2013 Elsevier Interactive Patient Education  2017 [employer]  

## 2018-06-20 NOTE — Telephone Encounter (Signed)
CT CAP with contrast in 1-2 weeks per 6/24 los

## 2018-06-22 ENCOUNTER — Inpatient Hospital Stay: Payer: Medicaid Other

## 2018-06-22 VITALS — BP 122/78 | HR 88 | Temp 98.1°F | Resp 18

## 2018-06-22 DIAGNOSIS — Z5111 Encounter for antineoplastic chemotherapy: Secondary | ICD-10-CM | POA: Diagnosis not present

## 2018-06-22 DIAGNOSIS — C189 Malignant neoplasm of colon, unspecified: Secondary | ICD-10-CM

## 2018-06-22 MED ORDER — HEPARIN SOD (PORK) LOCK FLUSH 100 UNIT/ML IV SOLN
500.0000 [IU] | Freq: Once | INTRAVENOUS | Status: AC | PRN
  Administered 2018-06-22: 500 [IU]
  Filled 2018-06-22: qty 5

## 2018-06-22 MED ORDER — SODIUM CHLORIDE 0.9% FLUSH
10.0000 mL | INTRAVENOUS | Status: DC | PRN
  Administered 2018-06-22: 10 mL
  Filled 2018-06-22: qty 10

## 2018-06-27 ENCOUNTER — Ambulatory Visit: Payer: Self-pay | Admitting: Hematology

## 2018-06-27 ENCOUNTER — Encounter: Payer: Self-pay | Admitting: Nutrition

## 2018-06-27 ENCOUNTER — Other Ambulatory Visit: Payer: Self-pay

## 2018-06-27 ENCOUNTER — Ambulatory Visit: Payer: Self-pay

## 2018-06-29 NOTE — Progress Notes (Signed)
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 abdomen as above. The adjacent swirling vessels are most consistent with an internal hernia. 2. The mass involving the pancreatic tail and spleen is similar in the interval. 3. The previously identified abscess inferior to the pancreatic mass has resolved with minimal fluid in this region. The tube is been removed. 4. Continued thickening of the colon as above consistent with the patient's known malignancy. 5. Atherosclerotic changes in the aorta. Aortic Atherosclerosis (ICD10-I70.0).   CT AP W Contrast 07/29/17 IMPRESSION: 1. Bowel obstruction at the level of the proximal descending colon at the location of a percutaneous drainage catheter. This could be secondary to wall thickening by the adjacent inflammatory process. Wall thickening due to colon neoplasm also remains a possibility. 2. Dilated, fluid-filled appendix, similar to the dilated loops of colon and small bowel, compatible with dilatation due to the bowel obstruction. There are no findings to indicate appendicitis. 3. Small amount of  free peritoneal fluid. 4. The percutaneously drained fluid collection in the left mid abdomen has with resolved. 5. Mild decrease in size of the multiloculated fluid collection in the splenic hilum and distal tail of the pancreas. 6. Stable 8 mm diffusely enhancing mass in the dome of the liver on the right. This is most likely benign. A solitary enhancing metastasis is less likely. 7. Moderate prostatic hypertrophy.  CT CP W Contrast 07/10/17 IMPRESSION: The salient finding is a new abscess measuring 3.8 x 4.6 cm inferior to the pancreatic tail and spleen.  CT CAP w contrast 07/05/2017  IMPRESSION: 1. Today's study demonstrates some positive response to therapy with decreased size of mass associated with the descending colon, decreased size of the invasive mass extending from the tail of the pancreas into the splenic hilum and decreased size of adjacent peritoneal lesions. There has also been regression of upper abdominal lymphadenopathy. 2. No new pulmonary or hepatic lesions identified to suggest progressive metastatic disease. 3. Aortic atherosclerosis. 4. Additional incidental findings, as above.  CT CAP w Contrast 04/09/2017 IMPRESSION: 1. Response to therapy with decreased peritoneal tumor volume throughout the abdomen. 2. No well-defined residual colonic mass. No bowel obstruction or other acute complication. 3. Decrease in gastrohepatic ligament adenopathy. 4. No new sites of disease. 5.  No acute process or evidence of metastatic disease in the chest. 6.  Aortic atherosclerosis. 7. Mild gynecomastia.  Colonoscopy 01/12/2017 Impression: - Malignant near obstructing tumor in the descending colon and at the splenic flexure. Biopsied. Tattooed. - One 25 mm polyp at the recto-sigmoid colon, removed with a hot snare. Resected and retrieved. Clips (MR unsafe) were placed.  Lower Extremity Ultrasound 02/16/17 Bilateral lower extremity venous duplex complete. There is  evidence of deep vein thrombosis involving the femoral, popliteal, and peroneal veins of the right lower extremity.  There is no evidence of superficial vein thrombosis involving the right lower extremity. There is evidence of superficial vein thrombosis involving the lesser saphenous vein of the left lower extremity. There is no evidence of deep vein thrombosis involving the left lower extremity. There is no evidence of a Baker's cyst bilaterally.  ASSESSMENT & PLAN: Jeff [name] is a 57 y.o.  African-American male, without a significant past medical history, no routine medical care, presented with intermittent rectal bleeding, anemia  and worsening abdominal pain for 3 years.  1. 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  -His case was previously reviewed in our GI tumor Board, unfortunately he has peritoneum metastasis, with a large 11 cm peritoneal metastasis in the left upper quadrant, which is consistent with peritoneal metastasis. -We previously reviewed the natural history of metastatic colon cancer. Unfortunately his cancer is incurable at this stage. We discussed the goal of therapy is palliative, to prolong his life and improve his quality of life. -He has started first line chemo FOLFOX. Due to history of pancytopenia, I'll skip the 5-FU bolus. -I previously discussed the [facility] genomic test result, which showed wild type KRAS, NRAS, and BRAF, MSI-stable. Based on this and his left-sided colon cancer, he will significantly benefit from EGFR inhibitor. He has started on panitumumab and has been tolerating well. -I previously reviewed the CT scan from 04/09/17 with the patient in detail. He has had excellent partial response to the chemotherapy treatment, no other new lesions, we'll continue  current chemotherapy regimen. -I reviewed his restaging CT scan from 07/05/2017, which showed a continuous response, no new lesions. We'll continue current treatment. -He previously developed a moderately thrombocytopenia, chemotherapy dose was reduced  --Due to Pancreatic Abscess,  His recent hospitalization, chemotherapy has been held. - he has  Being recovering well from his recent hospitalization, still in rehabilitation, but ambulates  Without any difficulty, he is also eating better. - due to his recent thrombosis and GI bleeding, he is not a candidate for Avastin for now. Given questionable disease progression, I recommend him to continue panitumumab for now - due to his recent bowel obstruction, I think he probably has had disease progression, although scan showed stable disease . I recommend to switch his chemotherapy to irinotecan alone, or FOLFIRI if he is able to tolerate.  - the goal of therapy is palliative. - we again discussed the incurable nature of his cancer, an overall poor prognosis. The goal of therapy is palliative. -Started irinotecan and panitumumab on 09/21/17. Tolerated well with increased ostomy output. Added 5-fu back with cycle 2 on 10/04/17 and reduced FOLFIRI due to overall limited PS. Increased to full dose FOLFIRI on 10/18/17, he tolerated well overall with mild to moderate diarrhea. -Pt did not tolerate the cycle 5 FOLFIRI well, despite dose reduction on both agents.  He is very reluctant to continue same regiment. Given his restaging CT scan showed mixed response, but overall stable disease,  I held Irinotecan, and continue 5-FU/leucovorin and Panitumumab as maintenance therapy on 11/29/17 -he has tolerated maintenance therapy well, without significant diarrhea.  -During his episode of extensive left lower extremity DVT on 01/25/18, his treatment was held.  Chemo treatment restarted after he recovered from DVT -He had a CT abdomen and pelvis without contrast when he was  in the hospital on January 08, 2018, which showed overall stable peritoneal metastasis. -CT CAP W Contrast  From 03/31/18 revealed disease progression of the splenic hilum mass, now 7.7 cm from 5.2 cm. -He has restarted FOLFIRI, I do not think he is a candidate for Avastin.  Vectibix was stopped due to disease progression  -I previously discussed the clinical trial options at [facility], especially [id number], Phase II single-arm study of palbociclib and cetuximab in RAS/RAF wildtype metastatic CRC. He is interested. -He is tolerating dose reduced FOLFIRI well, no worsening diarrhea, it  is manageable. -Restaging CT chest, abdomen and pelvis with contrast is scheduled for 07/07/18 -He is clinically doing well, his abdominal pain is controlled, he is on oxycodone 10 mg as needed, 5 to 6 tablets a day, no other new concerns. -Lab reviewed, adequate for treatment, will proceed chemo today. -If he shows disease progression on his restaging scan this week, I will likely refer him to [facility] for clinical trial.    2. History of right LE DVT, and extensive LLE DVT  -I previously reviewed his ultrasound results from 02/17/17, which showed DVT involving the femoral, popliteal, and peritoneal vein of the right lower extremity from 2/22. -He had history of right lower extremity DVT after surgery before  - his Xarelto has been held due to GI bleeding he has a IVC placed -No recurrence of bleeding since IVC placement.  -Pt was admitted to the hospital on 12/30/17 and discharged on 01/11/18. He presented with progressively worsening pain and swelling in the left leg. Iimaging studies notable for worsening left LE DVT. Initially hesitant to start anticoagulation due to known history of GI bleed while on Xarelto. Started on Eliquis and transferred to [facility] for thrombectomy. His left leg edema resolved after thrombectomy, he was discharged with Xarelto (he refused lovenox) '15mg'$  bid -He noticed recurrent GI  bleeding around 01/25/2018, resolved after holding Xarelto.  Has restarted Xarelto at 20 mg daily dose, tolerating well without bleeding. -I will refill his Xarelto. He is on 15 mg daily, dose reduced due to her previous severe GI bleeding.  3. 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 -- continue oxycodone as needed, pain controlled  -Has also developed some back pain in the past few weeks, related to chemo.  Using oxycodone for the pain. -I called in Neurontin '100mg'$  for his shooting leg pain (11/15/17). He can start low dose with 1 tab at night and increase up to 3 tablets in the evening if he can tolerate it.  -pain controlled overall, he is on oxycodone '10mg'$  5-6 tab daily  -I advised him to take his Neurontin at night, he can take 200 mg for the next few weeks, and I changed dose to 300 mg 3 times a day for next refill.   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 -He has received IV Feraheme twice, but his anemia has not improved. He likely has component of anemia of chronic disease secondary to his underlying malignancy. -Continue ferrous Sulfate  5. Weight loss, malnutrition, anorexia -Secondary to chemotherapy and cancer -The patient will continue to drink dietary supplements. -He has gained some weight lately - I again strongly encouraged him to try to eat more to avoid losing excess weight. I again recommended he drink 2 ensure or boost supplements a day to help. -I previously prescribed mirtazapine on 05/25/17 in attempts to help his appetite. He knows to take it once every evening. - continue follow-up with dietitian, previous f/u 10/22 -His weight is stable overall lately   6. History of pancreatic Abscess -Presented to hospital on 07/09/17 for abdominal pain. Based on SP CT from 07/10/17 he found to have an abscess. Draining tube placed and he received prolonged  course of antibiotics  - His percutaneous drainage tube has been removed  -this has resolved   7. Bowel obstruction due to his colon mass at the splenic flexure and surrounding inflammation  due to recent abscess, s/p diverting colostomy  -He was hospitalized on 07/29/17 for SBO  -He had sigmoidoscopy and loop Colostomy and has a J tube placed.  - he is tolerating diet well now. -Bowel movements overall have been normal, but sometimes he has softer stools and are more loose.  -I again reviewed the usage of both imodium and colace and in what context he should be using them both for his stool consistencies.  -He has colostomy prolapse, he knows to avoid lifting and carry heavy things -He saw his surgeon early 10/2017  - Diarrhea much improved  8. Diarrhea  -Second to chemotherapy -Recommend him to take Lomotil 1 to 2 tablets 3 times daily before each meals, and use Imodium 2 tablets every 6-8 hours as needed in between -Diarrhea improved  9. Goal of care discussion  -We previously discussed the incurable nature of his cancer, and the overall poor prognosis, especially if he does not have good response to chemotherapy or progress on chemo -The patient understands the goal of care is palliative. -I have previously recommended DNR/DNI, he will think about it.   Plan: -Lab reviewed, adequate for treatment, will proceed FOLFIRI today and continue every 2 weeks -Restaging CT CAP scheduled for 7/11, If he has disease progression on scan, I will call him to discuss referral to [facility] for clinical trial  -f/u in 2 weeks    All questions were answered. The patient knows to call the clinic with any problems, questions or concerns.  I spent 20 minutes counseling the patient face to face. The total time spent in the appointment was 25 minutes and more than 50% was on counseling.  Dierdre Searles Dweik am acting as scribe for Dr. [name].  I have reviewed the above documentation for accuracy and  completeness, and I agree with the above.    [name], MD 07/04/18

## 2018-07-01 ENCOUNTER — Ambulatory Visit (HOSPITAL_COMMUNITY): Payer: Medicaid Other

## 2018-07-04 ENCOUNTER — Inpatient Hospital Stay: Payer: Medicaid Other

## 2018-07-04 ENCOUNTER — Inpatient Hospital Stay (HOSPITAL_BASED_OUTPATIENT_CLINIC_OR_DEPARTMENT_OTHER): Payer: Medicaid Other | Admitting: *Deleted

## 2018-07-04 ENCOUNTER — Inpatient Hospital Stay (HOSPITAL_BASED_OUTPATIENT_CLINIC_OR_DEPARTMENT_OTHER): Payer: Medicaid Other | Admitting: Hematology

## 2018-07-04 ENCOUNTER — Telehealth: Payer: Self-pay | Admitting: Hematology

## 2018-07-04 ENCOUNTER — Encounter: Payer: Self-pay | Admitting: Hematology

## 2018-07-04 ENCOUNTER — Inpatient Hospital Stay: Payer: Medicaid Other | Attending: Hematology

## 2018-07-04 VITALS — BP 113/88 | HR 74 | Temp 98.2°F | Resp 16

## 2018-07-04 DIAGNOSIS — Z86718 Personal history of other venous thrombosis and embolism: Secondary | ICD-10-CM

## 2018-07-04 DIAGNOSIS — Z7901 Long term (current) use of anticoagulants: Secondary | ICD-10-CM

## 2018-07-04 DIAGNOSIS — D500 Iron deficiency anemia secondary to blood loss (chronic): Secondary | ICD-10-CM

## 2018-07-04 DIAGNOSIS — C186 Malignant neoplasm of descending colon: Secondary | ICD-10-CM | POA: Insufficient documentation

## 2018-07-04 DIAGNOSIS — C786 Secondary malignant neoplasm of retroperitoneum and peritoneum: Secondary | ICD-10-CM | POA: Diagnosis present

## 2018-07-04 DIAGNOSIS — Z933 Colostomy status: Secondary | ICD-10-CM

## 2018-07-04 DIAGNOSIS — M79605 Pain in left leg: Secondary | ICD-10-CM | POA: Insufficient documentation

## 2018-07-04 DIAGNOSIS — G893 Neoplasm related pain (acute) (chronic): Secondary | ICD-10-CM

## 2018-07-04 DIAGNOSIS — Z9221 Personal history of antineoplastic chemotherapy: Secondary | ICD-10-CM | POA: Diagnosis not present

## 2018-07-04 DIAGNOSIS — D630 Anemia in neoplastic disease: Secondary | ICD-10-CM | POA: Diagnosis not present

## 2018-07-04 DIAGNOSIS — Z5111 Encounter for antineoplastic chemotherapy: Secondary | ICD-10-CM | POA: Insufficient documentation

## 2018-07-04 DIAGNOSIS — C189 Malignant neoplasm of colon, unspecified: Secondary | ICD-10-CM

## 2018-07-04 DIAGNOSIS — C762 Malignant neoplasm of abdomen: Secondary | ICD-10-CM

## 2018-07-04 DIAGNOSIS — Z95828 Presence of other vascular implants and grafts: Secondary | ICD-10-CM

## 2018-07-04 DIAGNOSIS — D509 Iron deficiency anemia, unspecified: Secondary | ICD-10-CM

## 2018-07-04 LAB — CBC WITH DIFFERENTIAL/PLATELET
BASOS PCT: 1 %
Basophils Absolute: 0 10*3/uL (ref 0.0–0.1)
EOS ABS: 0.2 10*3/uL (ref 0.0–0.5)
EOS PCT: 4 %
HCT: 33 % — ABNORMAL LOW (ref 38.4–49.9)
Hemoglobin: 10.9 g/dL — ABNORMAL LOW (ref 13.0–17.1)
Lymphocytes Relative: 21 %
Lymphs Abs: 1.1 10*3/uL (ref 0.9–3.3)
MCH: 30.4 pg (ref 27.2–33.4)
MCHC: 32.9 g/dL (ref 32.0–36.0)
MCV: 92.3 fL (ref 79.3–98.0)
MONOS PCT: 10 %
Monocytes Absolute: 0.5 10*3/uL (ref 0.1–0.9)
Neutro Abs: 3.5 10*3/uL (ref 1.5–6.5)
Neutrophils Relative %: 64 %
PLATELETS: 152 10*3/uL (ref 140–400)
RBC: 3.57 MIL/uL — ABNORMAL LOW (ref 4.20–5.82)
RDW: 16.2 % — ABNORMAL HIGH (ref 11.0–14.6)
WBC: 5.4 10*3/uL (ref 4.0–10.3)

## 2018-07-04 LAB — COMPREHENSIVE METABOLIC PANEL
ALK PHOS: 81 U/L (ref 38–126)
ALT: 6 U/L (ref 0–44)
AST: 17 U/L (ref 15–41)
Albumin: 3.6 g/dL (ref 3.5–5.0)
Anion gap: 7 (ref 5–15)
BUN: 12 mg/dL (ref 6–20)
CALCIUM: 9.4 mg/dL (ref 8.9–10.3)
CO2: 27 mmol/L (ref 22–32)
CREATININE: 0.92 mg/dL (ref 0.61–1.24)
Chloride: 107 mmol/L (ref 98–111)
Glucose, Bld: 104 mg/dL — ABNORMAL HIGH (ref 70–99)
Potassium: 3.9 mmol/L (ref 3.5–5.1)
Sodium: 141 mmol/L (ref 135–145)
Total Bilirubin: <0.2 mg/dL — ABNORMAL LOW (ref 0.3–1.2)
Total Protein: 7.7 g/dL (ref 6.5–8.1)

## 2018-07-04 LAB — MAGNESIUM: Magnesium: 1.9 mg/dL (ref 1.7–2.4)

## 2018-07-04 LAB — IRON AND TIBC
Iron: 64 ug/dL (ref 42–163)
Saturation Ratios: 23 % — ABNORMAL LOW (ref 42–163)
TIBC: 276 ug/dL (ref 202–409)
UIBC: 212 ug/dL

## 2018-07-04 MED ORDER — PALONOSETRON HCL INJECTION 0.25 MG/5ML
0.2500 mg | Freq: Once | INTRAVENOUS | Status: AC
  Administered 2018-07-04: 0.25 mg via INTRAVENOUS

## 2018-07-04 MED ORDER — SODIUM CHLORIDE 0.9% FLUSH
10.0000 mL | INTRAVENOUS | Status: DC | PRN
  Administered 2018-07-04 (×2): 10 mL via INTRAVENOUS
  Filled 2018-07-04: qty 10

## 2018-07-04 MED ORDER — ATROPINE SULFATE 1 MG/ML IJ SOLN
INTRAMUSCULAR | Status: AC
  Filled 2018-07-04: qty 1

## 2018-07-04 MED ORDER — DEXTROSE 5 % IV SOLN
400.0000 mg/m2 | Freq: Once | INTRAVENOUS | Status: AC
  Administered 2018-07-04: 660 mg via INTRAVENOUS
  Filled 2018-07-04: qty 33

## 2018-07-04 MED ORDER — SODIUM CHLORIDE 0.9 % IV SOLN
Freq: Once | INTRAVENOUS | Status: AC
  Administered 2018-07-04: 10:00:00 via INTRAVENOUS

## 2018-07-04 MED ORDER — SODIUM CHLORIDE 0.9% FLUSH
10.0000 mL | INTRAVENOUS | Status: DC | PRN
  Filled 2018-07-04: qty 10

## 2018-07-04 MED ORDER — HEPARIN SOD (PORK) LOCK FLUSH 100 UNIT/ML IV SOLN
500.0000 [IU] | Freq: Once | INTRAVENOUS | Status: DC | PRN
  Filled 2018-07-04: qty 5

## 2018-07-04 MED ORDER — ATROPINE SULFATE 1 MG/ML IJ SOLN
0.5000 mg | Freq: Once | INTRAMUSCULAR | Status: AC | PRN
  Administered 2018-07-04: 0.5 mg via INTRAVENOUS

## 2018-07-04 MED ORDER — IRINOTECAN HCL CHEMO INJECTION 100 MG/5ML
120.0000 mg/m2 | Freq: Once | INTRAVENOUS | Status: AC
  Administered 2018-07-04: 200 mg via INTRAVENOUS
  Filled 2018-07-04: qty 10

## 2018-07-04 MED ORDER — PALONOSETRON HCL INJECTION 0.25 MG/5ML
INTRAVENOUS | Status: AC
  Filled 2018-07-04: qty 5

## 2018-07-04 MED ORDER — FLUOROURACIL CHEMO INJECTION 5 GM/100ML
2200.0000 mg/m2 | INTRAVENOUS | Status: DC
  Administered 2018-07-04: 3650 mg via INTRAVENOUS
  Filled 2018-07-04: qty 73

## 2018-07-04 MED ORDER — DEXAMETHASONE SODIUM PHOSPHATE 10 MG/ML IJ SOLN
10.0000 mg | Freq: Once | INTRAMUSCULAR | Status: AC
  Administered 2018-07-04: 10 mg via INTRAVENOUS

## 2018-07-04 MED ORDER — DEXAMETHASONE SODIUM PHOSPHATE 10 MG/ML IJ SOLN
INTRAMUSCULAR | Status: AC
  Filled 2018-07-04: qty 1

## 2018-07-04 NOTE — Patient Instructions (Signed)
[facility] Discharge Instructions for Patients Receiving Chemotherapy  Today you received the following chemotherapy agents:  Irinotecan, Leucovorin, and 5FU.   To help prevent nausea and vomiting after your treatment, we encourage you to take your nausea medication as directed.   If you develop nausea and vomiting that is not controlled by your nausea medication, call the clinic.   BELOW ARE SYMPTOMS THAT SHOULD BE REPORTED IMMEDIATELY:  *FEVER GREATER THAN 100.5 F  *CHILLS WITH OR WITHOUT FEVER  NAUSEA AND VOMITING THAT IS NOT CONTROLLED WITH YOUR NAUSEA MEDICATION  *UNUSUAL SHORTNESS OF BREATH  *UNUSUAL BRUISING OR BLEEDING  TENDERNESS IN MOUTH AND THROAT WITH OR WITHOUT PRESENCE OF ULCERS  *URINARY PROBLEMS  *BOWEL PROBLEMS  UNUSUAL RASH Items with * indicate a potential emergency and should be followed up as soon as possible.  Feel free to call the clinic should you have any questions or concerns. The clinic phone number is [phone].  Please show the CHEMO ALERT CARD at check-in to the Emergency Department and triage nurse.   

## 2018-07-04 NOTE — Telephone Encounter (Signed)
FMLA paperwork faxed to [employer] @ [phone].  Patient would like a copy for personal records mailed to home address.

## 2018-07-04 NOTE — Progress Notes (Signed)
Completed 07/04/18 

## 2018-07-04 NOTE — Telephone Encounter (Signed)
No LOS 7/8 °

## 2018-07-06 ENCOUNTER — Inpatient Hospital Stay (HOSPITAL_BASED_OUTPATIENT_CLINIC_OR_DEPARTMENT_OTHER): Payer: Medicaid Other

## 2018-07-06 VITALS — BP 88/66 | HR 100 | Temp 98.1°F | Resp 18

## 2018-07-06 DIAGNOSIS — C189 Malignant neoplasm of colon, unspecified: Secondary | ICD-10-CM

## 2018-07-06 DIAGNOSIS — Z5111 Encounter for antineoplastic chemotherapy: Secondary | ICD-10-CM | POA: Diagnosis not present

## 2018-07-06 MED ORDER — HEPARIN SOD (PORK) LOCK FLUSH 100 UNIT/ML IV SOLN
500.0000 [IU] | Freq: Once | INTRAVENOUS | Status: AC | PRN
  Administered 2018-07-06: 500 [IU]
  Filled 2018-07-06: qty 5

## 2018-07-06 MED ORDER — SODIUM CHLORIDE 0.9% FLUSH
10.0000 mL | INTRAVENOUS | Status: DC | PRN
  Administered 2018-07-06: 10 mL
  Filled 2018-07-06: qty 10

## 2018-07-07 ENCOUNTER — Other Ambulatory Visit: Payer: Self-pay | Admitting: Nurse Practitioner

## 2018-07-07 ENCOUNTER — Ambulatory Visit (HOSPITAL_COMMUNITY)
Admission: RE | Admit: 2018-07-07 | Discharge: 2018-07-07 | Disposition: A | Discharge status: Home / Self Care | Payer: Medicaid Other | Source: Ambulatory Visit | Attending: Hematology | Admitting: Hematology

## 2018-07-07 DIAGNOSIS — I700 Atherosclerosis of aorta: Secondary | ICD-10-CM | POA: Insufficient documentation

## 2018-07-07 DIAGNOSIS — D734 Cyst of spleen: Secondary | ICD-10-CM | POA: Insufficient documentation

## 2018-07-07 DIAGNOSIS — C189 Malignant neoplasm of colon, unspecified: Secondary | ICD-10-CM | POA: Diagnosis present

## 2018-07-07 DIAGNOSIS — J439 Emphysema, unspecified: Secondary | ICD-10-CM | POA: Diagnosis not present

## 2018-07-07 MED ORDER — IOPAMIDOL (ISOVUE-300) INJECTION 61%
INTRAVENOUS | Status: AC
  Filled 2018-07-07: qty 100

## 2018-07-07 MED ORDER — OXYCODONE HCL 10 MG PO TABS: 10.0000 mg | ORAL_TABLET | ORAL | 0 refills | Status: DC | PRN

## 2018-07-07 MED ORDER — IOPAMIDOL (ISOVUE-300) INJECTION 61%
100.0000 mL | Freq: Once | INTRAVENOUS | Status: AC | PRN
  Administered 2018-07-07: 100 mL via INTRAVENOUS

## 2018-07-07 MED FILL — oxyCODONE HCL 10 MG TABS: 10 | 15 days supply | Qty: 90 | Fill #0

## 2018-07-08 ENCOUNTER — Telehealth: Payer: Self-pay | Admitting: *Deleted

## 2018-07-08 NOTE — Telephone Encounter (Signed)
Voicemail received from patient's daughter requesting "Getting a message to Dr.Feng in regards to pain refill for Clabe Seal."   Message forwarded to collaborative nurse to assist with today's request.

## 2018-07-11 ENCOUNTER — Other Ambulatory Visit: Payer: Self-pay

## 2018-07-11 ENCOUNTER — Telehealth: Payer: Self-pay

## 2018-07-11 DIAGNOSIS — C189 Malignant neoplasm of colon, unspecified: Secondary | ICD-10-CM

## 2018-07-11 MED ORDER — DIPHENOXYLATE-ATROPINE 2.5-0.025 MG PO TABS: 2.0000 | ORAL_TABLET | Freq: Four times a day (QID) | ORAL | 1 refills | Status: DC | PRN

## 2018-07-11 MED FILL — DIPHENOXYLATE-ATROPINE 2.5-: 2.5-0.025 | 12 days supply | Qty: 90 | Fill #1

## 2018-07-11 NOTE — Telephone Encounter (Signed)
Daughter called and left a message requesting refill on Lomotil. Rx called into pharmacy.
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
## 2018-07-18 ENCOUNTER — Inpatient Hospital Stay: Payer: Medicaid Other

## 2018-07-18 ENCOUNTER — Inpatient Hospital Stay: Payer: Medicaid Other | Admitting: Nutrition

## 2018-07-18 ENCOUNTER — Inpatient Hospital Stay (HOSPITAL_BASED_OUTPATIENT_CLINIC_OR_DEPARTMENT_OTHER): Payer: Medicaid Other | Admitting: Nurse Practitioner

## 2018-07-18 ENCOUNTER — Encounter: Payer: Self-pay | Admitting: Nurse Practitioner

## 2018-07-18 VITALS — BP 123/85 | HR 93 | Temp 98.6°F | Resp 18 | Ht 69.0 in | Wt 126.8 lb

## 2018-07-18 DIAGNOSIS — Z95828 Presence of other vascular implants and grafts: Secondary | ICD-10-CM

## 2018-07-18 DIAGNOSIS — C786 Secondary malignant neoplasm of retroperitoneum and peritoneum: Secondary | ICD-10-CM

## 2018-07-18 DIAGNOSIS — Z933 Colostomy status: Secondary | ICD-10-CM

## 2018-07-18 DIAGNOSIS — Z5111 Encounter for antineoplastic chemotherapy: Secondary | ICD-10-CM | POA: Diagnosis not present

## 2018-07-18 DIAGNOSIS — D500 Iron deficiency anemia secondary to blood loss (chronic): Secondary | ICD-10-CM

## 2018-07-18 DIAGNOSIS — D630 Anemia in neoplastic disease: Secondary | ICD-10-CM

## 2018-07-18 DIAGNOSIS — C189 Malignant neoplasm of colon, unspecified: Secondary | ICD-10-CM

## 2018-07-18 DIAGNOSIS — C762 Malignant neoplasm of abdomen: Secondary | ICD-10-CM

## 2018-07-18 DIAGNOSIS — Z86718 Personal history of other venous thrombosis and embolism: Secondary | ICD-10-CM

## 2018-07-18 DIAGNOSIS — G893 Neoplasm related pain (acute) (chronic): Secondary | ICD-10-CM

## 2018-07-18 DIAGNOSIS — Z9221 Personal history of antineoplastic chemotherapy: Secondary | ICD-10-CM

## 2018-07-18 DIAGNOSIS — M79605 Pain in left leg: Secondary | ICD-10-CM

## 2018-07-18 DIAGNOSIS — Z7901 Long term (current) use of anticoagulants: Secondary | ICD-10-CM

## 2018-07-18 DIAGNOSIS — C186 Malignant neoplasm of descending colon: Secondary | ICD-10-CM | POA: Diagnosis not present

## 2018-07-18 LAB — COMPREHENSIVE METABOLIC PANEL
ALT: 6 U/L (ref 0–44)
ANION GAP: 7 (ref 5–15)
AST: 19 U/L (ref 15–41)
Albumin: 3.6 g/dL (ref 3.5–5.0)
Alkaline Phosphatase: 78 U/L (ref 38–126)
BUN: 6 mg/dL (ref 6–20)
CALCIUM: 9.2 mg/dL (ref 8.9–10.3)
CHLORIDE: 108 mmol/L (ref 98–111)
CO2: 24 mmol/L (ref 22–32)
Creatinine, Ser: 0.88 mg/dL (ref 0.61–1.24)
Glucose, Bld: 137 mg/dL — ABNORMAL HIGH (ref 70–99)
Potassium: 3.5 mmol/L (ref 3.5–5.1)
Sodium: 139 mmol/L (ref 135–145)
Total Bilirubin: 0.3 mg/dL (ref 0.3–1.2)
Total Protein: 7.7 g/dL (ref 6.5–8.1)

## 2018-07-18 LAB — CBC WITH DIFFERENTIAL/PLATELET
BASOS PCT: 1 %
Basophils Absolute: 0.1 10*3/uL (ref 0.0–0.1)
EOS ABS: 0.1 10*3/uL (ref 0.0–0.5)
Eosinophils Relative: 2 %
HCT: 35.4 % — ABNORMAL LOW (ref 38.4–49.9)
HEMOGLOBIN: 11.3 g/dL — AB (ref 13.0–17.1)
LYMPHS ABS: 1.2 10*3/uL (ref 0.9–3.3)
Lymphocytes Relative: 22 %
MCH: 29.9 pg (ref 27.2–33.4)
MCHC: 32 g/dL (ref 32.0–36.0)
MCV: 93.5 fL (ref 79.3–98.0)
Monocytes Absolute: 0.4 10*3/uL (ref 0.1–0.9)
Monocytes Relative: 7 %
NEUTROS PCT: 68 %
Neutro Abs: 3.9 10*3/uL (ref 1.5–6.5)
Platelets: 141 10*3/uL (ref 140–400)
RBC: 3.79 MIL/uL — AB (ref 4.20–5.82)
RDW: 16.7 % — ABNORMAL HIGH (ref 11.0–14.6)
WBC: 5.8 10*3/uL (ref 4.0–10.3)

## 2018-07-18 LAB — FERRITIN: FERRITIN: 161 ng/mL (ref 24–336)

## 2018-07-18 LAB — CEA (IN HOUSE-CHCC): CEA (CHCC-In House): 19.66 ng/mL — ABNORMAL HIGH (ref 0.00–5.00)

## 2018-07-18 LAB — MAGNESIUM: MAGNESIUM: 1.5 mg/dL — AB (ref 1.7–2.4)

## 2018-07-18 MED ORDER — TRIFLURIDINE-TIPIRACIL 20-8.19 MG PO TABS: 35.0000 mg/m2 | ORAL_TABLET | Freq: Two times a day (BID) | ORAL | 1 refills | Status: DC

## 2018-07-18 MED ORDER — SODIUM CHLORIDE 0.9% FLUSH
10.0000 mL | INTRAVENOUS | Status: DC | PRN
  Administered 2018-07-18: 10 mL via INTRAVENOUS
  Filled 2018-07-18: qty 10

## 2018-07-19 ENCOUNTER — Telehealth: Payer: Self-pay | Admitting: Pharmacist

## 2018-07-19 ENCOUNTER — Telehealth: Payer: Self-pay | Admitting: Nurse Practitioner

## 2018-07-19 DIAGNOSIS — C189 Malignant neoplasm of colon, unspecified: Secondary | ICD-10-CM

## 2018-07-19 MED ORDER — TRIFLURIDINE-TIPIRACIL 20-8.19 MG PO TABS: ORAL_TABLET | ORAL | 1 refills | Status: DC

## 2018-07-19 NOTE — Telephone Encounter (Signed)
Scheduled appt per 7/22 los - pt is aware of appt date and time.

## 2018-07-19 NOTE — Telephone Encounter (Signed)
Oral Oncology Pharmacist Encounter  Received new prescription for Lonsurf (trifluridine / tipiracil) for the treatment of previously treated, metastatic colon cancer (Kras wild-type), planned duration until disease progression or unacceptable toxixicty.  Labs from 07/18/2018 assessed, OK for treatment.  Current medication list in Epic reviewed, no DDIs with Lonsurf identified.  Lonsurf will be dosed at ~35.9 mg/m2 BID on days 1-5 & 8-12 of each 28 day cycle Plan to start 07/25/2018  Prescription has been e-scribed to the [facility] for benefits analysis and approval.  Oral Oncology Clinic will continue to follow for insurance authorization, copayment issues, initial counseling and start date.  Johny Drilling, PharmD, BCPS, BCOP  07/19/2018 9:03 AM Oral Oncology Clinic [phone]

## 2018-07-19 NOTE — Telephone Encounter (Signed)
Oral Chemotherapy Pharmacist Encounter     I spoke with patient for overview of: Lonsurf (trifluridine/tipiracil).    Counseled patient on administration, dosing, side effects, monitoring, drug-food interactions, safe handling, storage, and disposal.   Patient will take Lonsurf 20mg  (trifluridine component) tablets, 3 tablets (60mg  trifluridine) by mouth twice daily, within 1 hour hour of finishing AM & PM meals, on days 1-5 and days 8-12, every 28 days.  Patient plans to take Lonsurf M-F, Saturday and Sunday off days, Lonsurf M-F again, then no tablets for the next 2 weeks. Patient will start D1 of each cycle on Mondays.  Lonsurf start date: 07/25/2018   Adverse effects include but are not limited to: fatigue, nausea, vomiting, diarrhea, and decreased blood counts.    Patient has anti-emetic on hand and knows to take it if nausea develops.   Patient will obtain anti diarrheal and alert the office of 4 or more loose stools above baseline.  Patient updated about CBC check on Cycle 1 Day 14. Someone from the office will call to schedule lab check and office visit for the week on 08/08/2018.   Reviewed with patient importance of keeping a medication schedule and plan for any missed doses.   Mr. Garriga voiced understanding and appreciation.    All questions answered.  Medication reconciliation performed and medication/allergy list updated.  Patient will pick-up his Lonsurf from the [facility] tomorrow (07/20/2018) after 2pm for copayment $3 with active Medicaid prescription coverage.  Patient states he will give my contact # to his daughter in case she has any additional questions.   Patient knows to call the office with questions or concerns. [facility] Oncology Clinic will continue to follow.   Thank you,   Johny Drilling, PharmD, BCPS, BCOP  07/19/2018   11:25 AM [facility] Oncology Clinic [phone]

## 2018-07-19 NOTE — Telephone Encounter (Signed)
Oral Oncology Pharmacist Encounter  Received call from patient's daughter, [name], with questions about initial counseling for Lonsurf.  [name] and I discussed medication acquisition and administration of Lonsurf. She states they feel comfortable with initiation of new medication. Confirmed office visits on August 13 with Katisha.  [name] with multiple questions about enrollment into clinical trials at either [facility], [place], or [facility]. She requests follow-up from [name] with directions on possible enrollment in clinical trial.  [name] specifically asks if she should be contacting any facilities or if [name] will be doing that on her dad's behalf. Note will be forwarded to MD to follow-up with patient's daughter.  All questions answered. [name] expressed understanding and appreciation. She knows to call the office with any additional questions or concerns.  Johny Drilling, PharmD, BCPS, BCOP  07/19/2018 1:36 PM Oral Oncology Clinic [phone]

## 2018-07-19 NOTE — Telephone Encounter (Signed)
[name], please let pt's daughter know that I am contacting [facility] for clinical trial options, will call her when I have more info. Thanks   [name] MD

## 2018-07-20 ENCOUNTER — Other Ambulatory Visit: Payer: Self-pay | Admitting: Nurse Practitioner

## 2018-07-20 ENCOUNTER — Telehealth: Payer: Self-pay

## 2018-07-20 DIAGNOSIS — C189 Malignant neoplasm of colon, unspecified: Secondary | ICD-10-CM

## 2018-07-20 MED ORDER — OXYCODONE HCL 10 MG PO TABS: 10.0000 mg | ORAL_TABLET | ORAL | 0 refills | Status: DC | PRN

## 2018-07-20 MED FILL — oxyCODONE HCL 10 MG TABS: 10 | 15 days supply | Qty: 90 | Fill #0

## 2018-07-20 MED FILL — LONSURF 20 MG-8.19 MG TAB: 20-8.19 | 28 days supply | Qty: 60 | Fill #0

## 2018-07-20 NOTE — Telephone Encounter (Signed)
Patient's daughter calls for refill on his pain medication Oxycodone 10 mg

## 2018-07-20 NOTE — Telephone Encounter (Signed)
Called patient's daughter [name] per [name] informed her [name] checking into clinical trials at [facility], we will call her when we have more information.  She verbalized an understanding.

## 2018-07-22 NOTE — Telephone Encounter (Signed)
Oral Oncology Patient Advocate Encounter  Frankey Poot was picked up from [facility] on 07-21-18.  [place] Patient [facility] Phone [phone] Fax [phone]

## 2018-08-08 NOTE — Progress Notes (Signed)
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 03/31/18 IMPRESSION: 1. Interval increase in size, particularly craniocaudal extent, of the cystic mass within the splenic hilum. 2. Similar-appearing partially calcified soft tissue mass within the mesentery. 3. Interval increase in wall thickening of the descending colon.  CT AP WO Contrast 01/08/18 IMPRESSION: 1. Limited exam, without oral or IV contrast. 2. No evidence of abdominopelvic hemorrhage. 3. Small bilateral pleural effusions. 4. Grossly similar mass within the splenic hilum. 5.  Aortic Atherosclerosis (ICD10-I70.0).  Brain MRI WO Contrast 01/05/18 IMPRESSION: Negative MRI head with contrast.  Negative for metastatic disease.  CT AP W Contrast 12/30/17 IMPRESSION: 1. 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 However, there is now increasing thrombus within the left iliac and femoral veins with adjacent inflammation. 2. Mild circumferential bladder wall thickening which may be reactive or could indicate cystitis. Correlate clinically. 3. Unchanged complex cystic mass in the  splenic hilum and focal wall thickening of the splenic flexure. 4.  Aortic Atherosclerosis (ICD10-I70.0).  CT CAP W Contrast 11/25/17 IMPRESSION: 1. Response to therapy of intraperitoneal metastasis, including within the splenic hilum. 2.  No acute process or evidence of metastatic disease in the chest. 3. Persistent splenic flexure colonic wall thickening, with right-sided colostomy in place. 4.  Aortic Atherosclerosis (ICD10-I70.0).  CT AP W Contrast 08/15/17 IMPRESSION: 1. Early or new small bowel obstruction with a transition point in the right abdomen as above. The adjacent swirling vessels are most consistent with an internal hernia. 2. The mass involving the pancreatic tail and spleen is similar in the interval. 3. The previously identified abscess inferior to the pancreatic mass has resolved with minimal fluid in this region. The tube is been removed. 4. Continued thickening of the colon as above consistent with the patient's known malignancy. 5. Atherosclerotic changes in the aorta. Aortic Atherosclerosis (ICD10-I70.0).   CT AP W Contrast 07/29/17 IMPRESSION: 1. Bowel obstruction at the level of the proximal descending colon at the location of a percutaneous drainage catheter. This could be secondary to wall thickening by the adjacent inflammatory process. Wall thickening due to colon neoplasm also remains a possibility. 2. Dilated, fluid-filled appendix, similar to the dilated loops of colon and small bowel, compatible with dilatation due to the bowel obstruction. There are no findings to indicate appendicitis. 3. Small amount of free peritoneal fluid. 4. The percutaneously drained fluid collection in the left mid abdomen has with resolved. 5. Mild decrease in size of the multiloculated fluid collection in the splenic hilum and distal tail of the pancreas. 6. Stable 8 mm diffusely enhancing mass in the dome of the liver on the right. This is most likely benign. A  solitary enhancing metastasis is less likely. 7. Moderate prostatic hypertrophy.  CT CP W Contrast 07/10/17 IMPRESSION: The salient finding is a new abscess measuring 3.8 x 4.6 cm inferior to the pancreatic tail and spleen.  CT CAP w contrast 07/05/2017  IMPRESSION: 1. Today's study demonstrates some positive response to therapy with decreased size of mass associated with the descending colon, decreased size of the invasive mass extending from the tail of the pancreas into the splenic hilum and decreased size of adjacent peritoneal lesions. There has also been regression of upper abdominal lymphadenopathy. 2. No new  pulmonary or hepatic lesions identified to suggest progressive metastatic disease. 3. Aortic atherosclerosis. 4. Additional incidental findings, as above.  CT CAP w Contrast 04/09/2017 IMPRESSION: 1. Response to therapy with decreased peritoneal tumor volume throughout the abdomen. 2. No well-defined residual colonic mass. No bowel obstruction or other acute complication. 3. Decrease in gastrohepatic ligament adenopathy. 4. No new sites of disease. 5.  No acute process or evidence of metastatic disease in the chest. 6.  Aortic atherosclerosis. 7. Mild gynecomastia.  Colonoscopy 01/12/2017 Impression: - Malignant near obstructing tumor in the descending colon and at the splenic flexure. Biopsied. Tattooed. - One 25 mm polyp at the recto-sigmoid colon, removed with a hot snare. Resected and retrieved. Clips (MR unsafe) were placed.  Lower Extremity Ultrasound 02/16/17 Bilateral lower extremity venous duplex complete. There is evidence of deep vein thrombosis involving the femoral, popliteal, and peroneal veins of the right lower extremity.  There is no evidence of superficial vein thrombosis involving the right lower extremity. There is evidence of superficial vein thrombosis involving the lesser saphenous vein of the left lower extremity. There is no evidence of  deep vein thrombosis involving the left lower extremity. There is no evidence of a Baker's cyst bilaterally.  ASSESSMENT & PLAN: Jeff [name] is a 57 y.o.  African-American male, without a significant past medical history, no routine medical care, presented with intermittent rectal bleeding, anemia  and worsening abdominal pain for 3 years.   1. 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  -His case was previously reviewed in our GI tumor Board, unfortunately he has peritoneum metastasis, with a large 11 cm peritoneal metastasis in the left upper quadrant, which is consistent with peritoneal metastasis. -We previously reviewed the natural history of metastatic colon cancer. Unfortunately his cancer is incurable at this stage. We discussed the goal of therapy is palliative, to prolong his life and improve his quality of life. -He started first line chemo FOLFOX on 02/01/17. Due to history of pancytopenia, I skipped the 5-FU bolus. -I previously discussed the [facility] genomic test result, which showed wild type KRAS, NRAS, and BRAF, MSI-stable. Based on this and his left-sided colon cancer, he will significantly benefit from EGFR inhibitor. He has started on panitumumab and has been tolerating well. -I previously reviewed the CT scan from 04/09/17 with the patient in detail. He has had excellent partial response to the chemotherapy treatment, no other new lesions, we'll continue current chemotherapy regimen. -I reviewed his restaging CT scan from 07/05/2017, which showed a continuous response, no new lesions. We'll continue current treatment. -Due to his thrombosis and GI bleeding, he is not a candidate for Avastin for now. Given questionable disease progression, I recommend him to continue panitumumab only after  cycle 11 on 07/06/18.  -Due to his recent bowel obstruction, I think he probably has had disease progression, although scan showed stable disease . I recommend to switch his chemotherapy to irinotecan alone, or FOLFIRI if he is able to tolerate. The goal of therapy is palliative. -We again discussed the incurable nature of his cancer, an overall poor prognosis. The goal of therapy is palliative. -Started irinotecan and panitumumab on 09/21/17. Tolerated well with increased ostomy output. Added 5-fu back with cycle 2 on 10/04/17. Increased to full dose FOLFIRI on 10/18/17. Pt did not tolerate the cycle 5 FOLFIRI well, despite  dose reduction on both agents. He is very reluctant to continue same regiment.  -Given his restaging CT scan showed mixed response, but overall stable disease,  I held Irinotecan, and continue 5-FU/leucovorin and Panitumumab as maintenance therapy on 11/29/17. He has tolerated maintenance therapy well.  -During his episode of extensive left lower extremity DVT on 01/25/18, his treatment was held. Chemo treatment restarted after he recovered from DVT -He had a CT AP when he was in the hospital on January 08, 2018, which showed overall stable peritoneal metastasis. -CT CAP W Contrast from 03/31/18 revealed disease progression of the splenic hilum mass, now 7.7 cm from 5.2 cm. -He has restarted FOLFIRI on 04/18/18, I do not think he is a candidate for Avastin. Vectibix was stopped due to disease progression. He is tolerating dose reduced FOLFIRI well, no worsening diarrhea, it is manageable. -I previously reviewed his restaging CT from 07/07/18 with patient and daughter which shows slow, but significant progression since 01/2018.  -I recommended changing his treatment. He has had progressed through FOLFOX and FOLFIRI, I previously discussed the option of Lonsurf vs clinical trail. Due to his previous GI bleeding, extensive thrombosis, he is not a candidate for regorafenib. I think he would be a  good candidate for the [id number], A Phase II single-arm study of palbociclib and cetuximab in RAS/RAF wildtype metastatic CRC, which is open at [facility].  -The patient expressed interest, however his daughter is concerned the distance and travel it may require if he participates in a trial with it.  -he has started third line chemo Lonsurg on 07/25/18  -He has tolerated Lonsurf moderately well with decreased appetite, diarrhea, increased abdominal and right shoulder pain, and fatigue. Will monitor him at same dose for another cycle. Start next cycle on 08/22/18.  -However if his abdominal pain gets worse in the next two week, which likely indicating disease progression, then I may switch him back to FOLFIRI.  -I spoke with his daughter about the above, she agrees  -F/u lately next week      2. History of right LE DVT, and extensive LLE DVT  -I previously reviewed his ultrasound results from 02/17/17, which showed DVT involving the femoral, popliteal, and peritoneal vein of the right lower extremity from 2/22. -He had history of right lower extremity DVT post-surgery in the past -His Xarelto has been held due to GI bleeding he has a IVC placement -No recurrence of bleeding since IVC placement.  -Pt was admitted to the hospital on 12/30/17 and discharged on 01/11/18. He presented with progressively worsening pain and swelling in the left leg. Imaging studies notable for worsening left LE DVT. Initially hesitant to start anticoagulation due to known history of GI bleed while on Xarelto. Started on Eliquis and transferred to [facility] for thrombectomy. His left leg edema resolved after thrombectomy, he was discharged with Xarelto (he refused lovenox) 67m bid -He noticed recurrent GI bleeding around 01/25/2018, resolved after holding Xarelto.  Has restarted Xarelto at 20 mg daily dose, tolerating well without bleeding. -Continue Xarelto 15 mg daily, dose reduced due to her previous severe GI  bleeding. -He experienced another episode of GI bleeding on Xarelto and held for 6 days to recover before restarting. I advised him to hold Xarelto as needed when he experiences GI Bleeding.   3. Abdominal and knee and leg pain  -He has had left abdominal pain due to his metastasis and then back pain related to his chemo -I previously called  in Neurontin 141m for his shooting leg pain (11/15/17).  -pain controlled overall, he is on oxycodone 128m5-6 tab daily  -He is on Neurontin at night, he can take 200 mg for the next few weeks, and I previously changed dose to 300 mg TID.  -He notes new onset of right shoulder joint pain and increase of abdominal pain while on Lonsurf. He has been taking oxycodone q4hours to control his pain. I refilled oxycodone today (08/09/18)  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 -He has received IV Feraheme twice, but his anemia has not improved. He likely has component of anemia of chronic disease secondary to his underlying malignancy. -Hg at 10.7 today (08/09/18), Continue ferrous Sulfate  5. Weight loss, malnutrition, anorexia -Secondary to chemotherapy and cancer -The patient will continue to drink dietary supplements. -He has gained some weight lately - I again strongly encouraged him to try to eat more to avoid losing excess weight. I again recommended he drink 2 ensure or boost supplements a day to help. -I previously prescribed mirtazapine on 05/25/17 in attempts to help his appetite. He knows to take it once every evening. -Continue follow-up with dietitian, previous f/u 10/22 -His appetite has decreased on Lonsurf, but his weight is stable overall  6. History of pancreatic Abscess -Presented to hospital on 07/09/17 for abdominal pain. Based on SP CT from 07/10/17 he found to have an abscess. Draining tube placed and he received prolonged course of antibiotics  -His percutaneous  drainage tube has been removed  -Resolved   7. Bowel obstruction due to his colon mass at the splenic flexure and surrounding inflammation due to recent abscess, s/p diverting colostomy  -He was hospitalized on 07/29/17 for SBO  -He had sigmoidoscopy and loop Colostomy and has a J tube placed.  - he is tolerating diet well now. -Bowel movements overall have been normal, but sometimes he has softer stools and are more loose.  -I again reviewed the usage of both imodium and colace and in what context he should be using them both for his stool consistencies.  -He has colostomy prolapse, he knows to avoid lifting and carry heavy things -He saw his surgeon early 10/2017  -Diarrhea controlled   8. Diarrhea  -Second to chemotherapy -Recommend him to take Lomotil 1 to 2 tablets 3 times daily before each meals, and use Imodium 2 tablets every 6-8 hours as needed in between -Diarrhea increased with Lonsurf but controlled with lomotil and imodium.   9. Goal of care discussion  -We previously discussed the incurable nature of his cancer, and the overall poor prognosis, especially if he does not have good response to chemotherapy or progress on chemo -The patient understands the goal of care is palliative. -I have previously recommended DNR/DNI, he will think about it.    Plan: -I refilled his oxycodone, zofran and potassium today  -Lab,flush and f/u with me or Lacie on 8/22 or 8/23  -Plan to restart next cycle on 8/26, or switch back to FOLFIRI if abdominal pain worsen   All questions were answered. The patient knows to call the clinic with any problems, questions or concerns.  I spent 20 minutes counseling the patient face to face. The total time spent in the appointment was 25 minutes and more than 50% was on counseling.  I,Oneal Deputyam acting as scribe for YaTruitt MerleMD.   I have reviewed the above documentation for  accuracy and completeness, and I agree with the above.     [name],  MD 08/09/18

## 2018-08-09 ENCOUNTER — Inpatient Hospital Stay: Payer: Medicaid Other

## 2018-08-09 ENCOUNTER — Encounter: Payer: Self-pay | Admitting: Hematology

## 2018-08-09 ENCOUNTER — Inpatient Hospital Stay: Payer: Medicaid Other | Attending: Hematology | Admitting: Hematology

## 2018-08-09 ENCOUNTER — Telehealth: Payer: Self-pay | Admitting: Hematology

## 2018-08-09 VITALS — BP 105/77 | HR 100 | Temp 98.3°F | Resp 16 | Ht 69.0 in | Wt 125.2 lb

## 2018-08-09 DIAGNOSIS — Z86711 Personal history of pulmonary embolism: Secondary | ICD-10-CM | POA: Diagnosis not present

## 2018-08-09 DIAGNOSIS — C786 Secondary malignant neoplasm of retroperitoneum and peritoneum: Secondary | ICD-10-CM | POA: Insufficient documentation

## 2018-08-09 DIAGNOSIS — G893 Neoplasm related pain (acute) (chronic): Secondary | ICD-10-CM | POA: Diagnosis not present

## 2018-08-09 DIAGNOSIS — R066 Hiccough: Secondary | ICD-10-CM | POA: Diagnosis not present

## 2018-08-09 DIAGNOSIS — K9409 Other complications of colostomy: Secondary | ICD-10-CM | POA: Insufficient documentation

## 2018-08-09 DIAGNOSIS — C762 Malignant neoplasm of abdomen: Secondary | ICD-10-CM

## 2018-08-09 DIAGNOSIS — Z5111 Encounter for antineoplastic chemotherapy: Secondary | ICD-10-CM | POA: Diagnosis present

## 2018-08-09 DIAGNOSIS — D630 Anemia in neoplastic disease: Secondary | ICD-10-CM

## 2018-08-09 DIAGNOSIS — Z86718 Personal history of other venous thrombosis and embolism: Secondary | ICD-10-CM | POA: Diagnosis not present

## 2018-08-09 DIAGNOSIS — Z7901 Long term (current) use of anticoagulants: Secondary | ICD-10-CM | POA: Insufficient documentation

## 2018-08-09 DIAGNOSIS — Z95828 Presence of other vascular implants and grafts: Secondary | ICD-10-CM

## 2018-08-09 DIAGNOSIS — C189 Malignant neoplasm of colon, unspecified: Secondary | ICD-10-CM

## 2018-08-09 DIAGNOSIS — E876 Hypokalemia: Secondary | ICD-10-CM

## 2018-08-09 DIAGNOSIS — B370 Candidal stomatitis: Secondary | ICD-10-CM | POA: Diagnosis present

## 2018-08-09 DIAGNOSIS — M25511 Pain in right shoulder: Secondary | ICD-10-CM | POA: Diagnosis not present

## 2018-08-09 DIAGNOSIS — Z79899 Other long term (current) drug therapy: Secondary | ICD-10-CM | POA: Insufficient documentation

## 2018-08-09 DIAGNOSIS — C186 Malignant neoplasm of descending colon: Secondary | ICD-10-CM | POA: Diagnosis present

## 2018-08-09 DIAGNOSIS — K922 Gastrointestinal hemorrhage, unspecified: Secondary | ICD-10-CM | POA: Diagnosis not present

## 2018-08-09 DIAGNOSIS — D509 Iron deficiency anemia, unspecified: Secondary | ICD-10-CM

## 2018-08-09 DIAGNOSIS — D500 Iron deficiency anemia secondary to blood loss (chronic): Secondary | ICD-10-CM

## 2018-08-09 LAB — COMPREHENSIVE METABOLIC PANEL
ALBUMIN: 3.7 g/dL (ref 3.5–5.0)
ALT: <6 U/L (ref 0–44)
ANION GAP: 13 (ref 5–15)
AST: 18 U/L (ref 15–41)
Alkaline Phosphatase: 65 U/L (ref 38–126)
BUN: 9 mg/dL (ref 6–20)
CO2: 21 mmol/L — AB (ref 22–32)
Calcium: 8.9 mg/dL (ref 8.9–10.3)
Chloride: 103 mmol/L (ref 98–111)
Creatinine, Ser: 0.77 mg/dL (ref 0.61–1.24)
GFR calc Af Amer: >60 mL/min (ref >60)
GFR calc non Af Amer: >60 mL/min (ref >60)
GLUCOSE: 99 mg/dL (ref 70–99)
POTASSIUM: 3.6 mmol/L (ref 3.5–5.1)
SODIUM: 137 mmol/L (ref 135–145)
Total Bilirubin: 0.4 mg/dL (ref 0.3–1.2)
Total Protein: 8.1 g/dL (ref 6.5–8.1)

## 2018-08-09 LAB — CBC WITH DIFFERENTIAL/PLATELET
BASOS ABS: 0 10*3/uL (ref 0.0–0.1)
Basophils Relative: 1 %
Eosinophils Absolute: 0.1 10*3/uL (ref 0.0–0.5)
Eosinophils Relative: 2 %
HEMATOCRIT: 32.7 % — AB (ref 38.4–49.9)
Hemoglobin: 10.7 g/dL — ABNORMAL LOW (ref 13.0–17.1)
LYMPHS PCT: 21 %
Lymphs Abs: 1.3 10*3/uL (ref 0.9–3.3)
MCH: 30.1 pg (ref 27.2–33.4)
MCHC: 32.7 g/dL (ref 32.0–36.0)
MCV: 91.9 fL (ref 79.3–98.0)
MONO ABS: 0.3 10*3/uL (ref 0.1–0.9)
MONOS PCT: 5 %
NEUTROS ABS: 4.6 10*3/uL (ref 1.5–6.5)
Neutrophils Relative %: 71 %
Platelets: 155 10*3/uL (ref 140–400)
RBC: 3.56 MIL/uL — ABNORMAL LOW (ref 4.20–5.82)
RDW: 14.6 % (ref 11.0–14.6)
WBC: 6.4 10*3/uL (ref 4.0–10.3)

## 2018-08-09 MED ORDER — SODIUM CHLORIDE 0.9% FLUSH
10.0000 mL | INTRAVENOUS | Status: DC | PRN
  Administered 2018-08-09: 10 mL via INTRAVENOUS
  Filled 2018-08-09: qty 10

## 2018-08-09 MED ORDER — HEPARIN SOD (PORK) LOCK FLUSH 100 UNIT/ML IV SOLN
500.0000 [IU] | INTRAVENOUS | Status: DC | PRN
  Administered 2018-08-09: 500 [IU] via INTRAVENOUS
  Filled 2018-08-09: qty 5

## 2018-08-09 MED ORDER — POTASSIUM CHLORIDE CRYS ER 20 MEQ PO TBCR
20.0000 meq | EXTENDED_RELEASE_TABLET | Freq: Every day | ORAL | 2 refills | Status: DC
Start: 2018-08-09 — End: 2018-09-08

## 2018-08-09 MED ORDER — OXYCODONE HCL 10 MG PO TABS: 10.0000 mg | ORAL_TABLET | ORAL | 0 refills | Status: DC | PRN

## 2018-08-09 MED ORDER — ONDANSETRON HCL 8 MG PO TABS: 8.0000 mg | ORAL_TABLET | Freq: Two times a day (BID) | ORAL | 3 refills | Status: DC | PRN

## 2018-08-09 MED FILL — ONDANSETRON HCL 8 MG TABLET: 8 | 15 days supply | Qty: 30 | Fill #0

## 2018-08-09 MED FILL — POTASSIUM CL ER 20 MEQ TABL: 20 | 30 days supply | Qty: 30 | Fill #0

## 2018-08-09 MED FILL — oxyCODONE HCL 10 MG TABS: 10 | 15 days supply | Qty: 90 | Fill #0

## 2018-08-09 NOTE — Telephone Encounter (Signed)
Scheduled appt per 8/13 los - gave patient aVS and calender per los.

## 2018-08-18 NOTE — Progress Notes (Signed)
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 Interval increase in size, particularly craniocaudal extent, of the cystic mass within the splenic hilum. 2. Similar-appearing partially calcified soft tissue mass within the mesentery. 3. Interval increase in wall thickening of the descending colon.  CT AP WO Contrast 01/08/18 IMPRESSION: 1. Limited exam, without oral or IV contrast. 2. No evidence of abdominopelvic hemorrhage. 3. Small bilateral pleural effusions. 4. Grossly similar mass within the splenic hilum. 5.  Aortic Atherosclerosis (ICD10-I70.0).  Brain MRI WO Contrast 01/05/18 IMPRESSION: Negative MRI head with contrast.  Negative for metastatic disease.  CT AP W Contrast 12/30/17 IMPRESSION: 1. 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 However, there is now increasing thrombus within the left iliac and femoral veins with adjacent inflammation. 2. Mild circumferential bladder wall thickening which may be reactive or could indicate cystitis. Correlate clinically. 3. Unchanged complex cystic mass in the splenic hilum and focal  wall thickening of the splenic flexure. 4.  Aortic Atherosclerosis (ICD10-I70.0).  CT CAP W Contrast 11/25/17 IMPRESSION: 1. Response to therapy of intraperitoneal metastasis, including within the splenic hilum. 2.  No acute process or evidence of metastatic disease in the chest. 3. Persistent splenic flexure colonic wall thickening, with right-sided colostomy in place. 4.  Aortic Atherosclerosis (ICD10-I70.0).  CT AP W Contrast 08/15/17 IMPRESSION: 1. Early or new small bowel obstruction with a transition point in the right abdomen as above. The adjacent swirling vessels are most consistent with an internal hernia. 2. The mass involving the pancreatic tail and spleen is similar in the interval. 3. The previously identified abscess inferior to the pancreatic mass has resolved with minimal fluid in this region. The tube is been removed. 4. Continued thickening of the colon as above consistent with the patient's known malignancy. 5. Atherosclerotic changes in the aorta. Aortic Atherosclerosis (ICD10-I70.0).   CT AP W Contrast 07/29/17 IMPRESSION: 1. Bowel obstruction at the level of the proximal descending colon at the location of a percutaneous drainage catheter. This could be secondary to wall thickening by the adjacent inflammatory process. Wall thickening due to colon neoplasm also remains a possibility. 2. Dilated, fluid-filled appendix, similar to the dilated loops of colon and small bowel, compatible with dilatation due to the bowel obstruction. There are no findings to indicate appendicitis. 3. Small amount of free peritoneal fluid. 4. The percutaneously drained fluid collection in the left mid abdomen has with resolved. 5. Mild decrease in size of the multiloculated fluid collection in the splenic hilum and distal tail of the pancreas. 6. Stable 8 mm diffusely enhancing mass in the dome of the liver on the right. This is most likely benign. A solitary  enhancing metastasis is less likely. 7. Moderate prostatic hypertrophy.  CT CP W Contrast 07/10/17 IMPRESSION: The salient finding is a new abscess measuring 3.8 x 4.6 cm inferior to the pancreatic tail and spleen.  CT CAP w contrast 07/05/2017  IMPRESSION: 1. Today's study demonstrates some positive response to therapy with decreased size of mass associated with the descending colon, decreased size of the invasive mass extending from the tail of the pancreas into the splenic hilum and decreased size of adjacent peritoneal lesions. There has also been regression of upper abdominal lymphadenopathy. 2. No new pulmonary or hepatic lesions  identified to suggest progressive metastatic disease. 3. Aortic atherosclerosis. 4. Additional incidental findings, as above.  CT CAP w Contrast 04/09/2017 IMPRESSION: 1. Response to therapy with decreased peritoneal tumor volume throughout the abdomen. 2. No well-defined residual colonic mass. No bowel obstruction or other acute complication. 3. Decrease in gastrohepatic ligament adenopathy. 4. No new sites of disease. 5.  No acute process or evidence of metastatic disease in the chest. 6.  Aortic atherosclerosis. 7. Mild gynecomastia.  Colonoscopy 01/12/2017 Impression: - Malignant near obstructing tumor in the descending colon and at the splenic flexure. Biopsied. Tattooed. - One 25 mm polyp at the recto-sigmoid colon, removed with a hot snare. Resected and retrieved. Clips (MR unsafe) were placed.  Lower Extremity Ultrasound 02/16/17 Bilateral lower extremity venous duplex complete. There is evidence of deep vein thrombosis involving the femoral, popliteal, and peroneal veins of the right lower extremity.  There is no evidence of superficial vein thrombosis involving the right lower extremity. There is evidence of superficial vein thrombosis involving the lesser saphenous vein of the left lower extremity. There is no evidence of deep vein  thrombosis involving the left lower extremity. There is no evidence of a Baker's cyst bilaterally.  ASSESSMENT & PLAN: Jeff [name] is a 57 y.o.  African-American male, without a significant past medical history, no routine medical care, presented with intermittent rectal bleeding, anemia  and worsening abdominal pain for 3 years.   1. 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  -His case was previously reviewed in our GI tumor Board, unfortunately he has peritoneum metastasis, with a large 11 cm peritoneal metastasis in the left upper quadrant, which is consistent with peritoneal metastasis. -We previously reviewed the natural history of metastatic colon cancer. Unfortunately his cancer is incurable at this stage. We discussed the goal of therapy is palliative, to prolong his life and improve his quality of life. -He started first line chemo FOLFOX on 02/01/17. Due to history of pancytopenia, I skipped the 5-FU bolus. -I previously discussed the [facility] genomic test result, which showed wild type KRAS, NRAS, and BRAF, MSI-stable. Based on this and his left-sided colon cancer, he will significantly benefit from EGFR inhibitor. He has started on panitumumab and has been tolerating well. -I previously reviewed the CT scan from 04/09/17 with the patient in detail. He has had excellent partial response to the chemotherapy treatment, no other new lesions, we'll continue current chemotherapy regimen. -I reviewed his restaging CT scan from 07/05/2017, which showed a continuous response, no new lesions. We'll continue current treatment. -Due to his thrombosis and GI bleeding, he is not a candidate for Avastin for now. Given questionable disease progression, I recommend him to continue panitumumab only after cycle 11 on  07/06/18.  -Due to his recent bowel obstruction, I think he probably has had disease progression, although scan showed stable disease . I recommend to switch his chemotherapy to irinotecan alone, or FOLFIRI if he is able to tolerate. The goal of therapy is palliative. -We had again discussed the incurable nature of his cancer, an overall poor prognosis. The goal of therapy is palliative. -Started irinotecan and panitumumab on 09/21/17. Tolerated well with increased ostomy output. Added 5-fu back with cycle 2 on 10/04/17. Increased to full dose FOLFIRI on 10/18/17. Pt did not tolerate the cycle 5 FOLFIRI well, despite dose reduction on  both agents. He is very reluctant to continue same regiment.  -Given his restaging CT scan showed mixed response, but overall stable disease,  I held Irinotecan, and continue 5-FU/leucovorin and Panitumumab as maintenance therapy on 11/29/17. He has tolerated maintenance therapy well.  -During his episode of extensive left lower extremity DVT on 01/25/18, his treatment was held. Chemo treatment restarted after he recovered from DVT -He had a CT AP when he was in the hospital on January 08, 2018, which showed overall stable peritoneal metastasis. -CT CAP W Contrast from 03/31/18 revealed disease progression of the splenic hilum mass, now 7.7 cm from 5.2 cm. -He restarted FOLFIRI on 04/18/18, I do not think he is a candidate for Avastin. Vectibix was stopped due to disease progression. He is tolerating dose reduced FOLFIRI well, no worsening diarrhea, it is manageable. -I previously reviewed his restaging CT from 07/07/18 with patient and daughter which shows slow, but significant progression since 01/2018.  -I recommended changing his treatment. He has had progressed through FOLFOX and FOLFIRI, I previously discussed the option of Lonsurf vs clinical trail. Due to his previous GI bleeding, extensive thrombosis, he is not a candidate for regorafenib. I think he would be a good candidate  for the [id number], A Phase II single-arm study of palbociclib and cetuximab in RAS/RAF wildtype metastatic CRC, which is open at [facility].  -The patient expressed interest, however his daughter is concerned the distance and travel it may require if he participates in a trial with it.  -He started third line chemo Lonsurf on 07/25/18  -As he continues Lonsurf his abdominal pain and fatigue worsened, with significant diarrhea, no appetite and weight loss. I do not think he can tolerate Lonsurf any further, and he clinically did not response (with worsening pain), so I will stop.  -I suggest he restart FOLFIRI which he was able to tolerate better while we continue to look into clinical trails. He and his daughter are agreeable. Will start next week. He has not have any significant GI bleeding lately, may consider adding Avastin in near future   -F/u in 3 weeks      2. History of right LE DVT, and extensive LLE DVT  -I previously reviewed his ultrasound results from 02/17/17, which showed DVT involving the femoral, popliteal, and peritoneal vein of the right lower extremity from 2/22. -He had history of right lower extremity DVT post-surgery in the past -His Xarelto has been held due to GI bleeding he has a IVC placement -No recurrence of bleeding since IVC placement.  -Pt was admitted to the hospital on 12/30/17 and discharged on 01/11/18. He presented with progressively worsening pain and swelling in the left leg. Imaging studies notable for worsening left LE DVT. Initially hesitant to start anticoagulation due to known history of GI bleed while on Xarelto. Started on Eliquis and transferred to [facility] for thrombectomy. His left leg edema resolved after thrombectomy, he was discharged with Xarelto (he refused lovenox) 19m bid -He noticed recurrent GI bleeding around 01/25/2018, resolved after holding Xarelto.  Has restarted Xarelto at 20 mg daily dose, tolerating well without  bleeding. -Continue Xarelto 15 mg daily, dose reduced due to her previous severe GI bleeding. -He experienced another episode of GI bleeding on Xarelto and held for 6 days to recover before restarting. I previously advised him to hold Xarelto as needed when he experiences GI Bleeding.   3. Abdominal and knee and leg pain   -He has had left  abdominal pain due to his metastasis and then back pain related to his chemo -I previously called in Neurontin 139m for his shooting leg pain (11/15/17).  -pain controlled overall, he is on oxycodone 110m5-6 tab daily  -He is on Neurontin at night, he can take 200 mg for the next few weeks, and I previously changed dose to 300 mg TID.  -His abdominal pain continued to worsen on Lonsurf. I stopped his Lonsurf and prescribed his MS Contin q12hours (08/19/18). I refilled his Oxycodone q6-8hours to take as needed for breakthrough pain.   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 -He has received IV Feraheme twice, but his anemia has not improved. He likely has component of anemia of chronic disease secondary to his underlying malignancy. -Continue ferrous Sulfate  5. Weight loss, malnutrition, anorexia -Secondary to chemotherapy and cancer -The patient will continue to drink dietary supplements. -He has gained some weight lately - I again strongly encouraged him to try to eat more to avoid losing excess weight. I again recommended he drink 2 ensure or boost supplements a day to help. -I previously prescribed mirtazapine on 05/25/17 in attempts to help his appetite. He knows to take it once every evening. -Continue follow-up with dietitian  -On Lonsurf his appetite continued to decrease and began loosing weight. I stopped Lonsurf. He will continue Mirtazapine. His appetite will likely improve once abdominal pain is controlled.   6. History of pancreatic Abscess -Presented to hospital on  07/09/17 for abdominal pain. Based on SP CT from 07/10/17 he found to have an abscess. Draining tube placed and he received prolonged course of antibiotics  -His percutaneous drainage tube has been removed  -Resolved   7. Bowel obstruction due to his colon mass at the splenic flexure and surrounding inflammation due to recent abscess, s/p diverting colostomy  -He was hospitalized on 07/29/17 for SBO  -He had sigmoidoscopy and loop Colostomy and has a J tube placed.  - he is tolerating diet well now. -Bowel movements overall have been normal, but sometimes he has softer stools and are more loose.  -I again reviewed the usage of both imodium and colace and in what context he should be using them both for his stool consistencies.  -He has colostomy prolapse, he knows to avoid lifting and carry heavy things -He saw his surgeon early in 10/2017    8. Diarrhea  -Second to chemotherapy -Recommend him to take Lomotil 1 to 2 tablets 3 times daily before each meals, and use Imodium 2 tablets every 6-8 hours as needed in between. This controlled his diarrhea.  -Diarrhea had increased with Lonsurf. I stopped Lonsurf and refilled diarrhea medication.     9. Goal of care discussion  -We previously discussed the incurable nature of his cancer, and the overall poor prognosis, especially if he does not have good response to chemotherapy or progress on chemo -The patient understands the goal of care is palliative. -I have previously recommended DNR/DNI, he will think about it.    Plan: -Will stop Lonsurf due to poor toleration and lack of clinical benefit -I prescribed MS Contin and refilled Lomotil, and oxycodone today  -Lab, flush and FOLFIRI next week and 2 weeks after  -F/u with second cycle chemo    All questions were answered. The patient knows to call the clinic with any problems, questions or concerns.  I spent 20 minutes counseling the patient face to face.  The total time spent in the  appointment was 25 minutes and more than 50% was on counseling.  Oneal Deputy, am acting as scribe for [name], MD.   I have reviewed the above documentation for accuracy and completeness, and I agree with the above.      [name], MD 08/20/18

## 2018-08-19 ENCOUNTER — Inpatient Hospital Stay: Payer: Medicaid Other

## 2018-08-19 ENCOUNTER — Encounter: Payer: Self-pay | Admitting: Hematology

## 2018-08-19 ENCOUNTER — Telehealth: Payer: Self-pay | Admitting: Hematology

## 2018-08-19 ENCOUNTER — Encounter: Payer: Self-pay | Admitting: General Practice

## 2018-08-19 ENCOUNTER — Inpatient Hospital Stay (HOSPITAL_BASED_OUTPATIENT_CLINIC_OR_DEPARTMENT_OTHER): Payer: Medicaid Other | Admitting: Hematology

## 2018-08-19 VITALS — BP 112/77 | HR 108 | Temp 99.3°F | Resp 18 | Ht 69.0 in | Wt 121.3 lb

## 2018-08-19 DIAGNOSIS — C786 Secondary malignant neoplasm of retroperitoneum and peritoneum: Secondary | ICD-10-CM | POA: Diagnosis not present

## 2018-08-19 DIAGNOSIS — D630 Anemia in neoplastic disease: Secondary | ICD-10-CM

## 2018-08-19 DIAGNOSIS — Z7901 Long term (current) use of anticoagulants: Secondary | ICD-10-CM

## 2018-08-19 DIAGNOSIS — Z79899 Other long term (current) drug therapy: Secondary | ICD-10-CM

## 2018-08-19 DIAGNOSIS — Z5111 Encounter for antineoplastic chemotherapy: Secondary | ICD-10-CM | POA: Diagnosis not present

## 2018-08-19 DIAGNOSIS — G893 Neoplasm related pain (acute) (chronic): Secondary | ICD-10-CM

## 2018-08-19 DIAGNOSIS — Z95828 Presence of other vascular implants and grafts: Secondary | ICD-10-CM

## 2018-08-19 DIAGNOSIS — D500 Iron deficiency anemia secondary to blood loss (chronic): Secondary | ICD-10-CM

## 2018-08-19 DIAGNOSIS — C189 Malignant neoplasm of colon, unspecified: Secondary | ICD-10-CM

## 2018-08-19 DIAGNOSIS — Z86718 Personal history of other venous thrombosis and embolism: Secondary | ICD-10-CM

## 2018-08-19 DIAGNOSIS — K9409 Other complications of colostomy: Secondary | ICD-10-CM

## 2018-08-19 DIAGNOSIS — D509 Iron deficiency anemia, unspecified: Secondary | ICD-10-CM

## 2018-08-19 DIAGNOSIS — C762 Malignant neoplasm of abdomen: Secondary | ICD-10-CM

## 2018-08-19 DIAGNOSIS — Z86711 Personal history of pulmonary embolism: Secondary | ICD-10-CM

## 2018-08-19 DIAGNOSIS — C186 Malignant neoplasm of descending colon: Secondary | ICD-10-CM

## 2018-08-19 DIAGNOSIS — K922 Gastrointestinal hemorrhage, unspecified: Secondary | ICD-10-CM

## 2018-08-19 DIAGNOSIS — M25511 Pain in right shoulder: Secondary | ICD-10-CM

## 2018-08-19 LAB — CBC WITH DIFFERENTIAL/PLATELET
Basophils Absolute: 0 10*3/uL (ref 0.0–0.1)
Basophils Relative: 0 %
EOS ABS: 0 10*3/uL (ref 0.0–0.5)
Eosinophils Relative: 0 %
HCT: 32.5 % — ABNORMAL LOW (ref 38.4–49.9)
Hemoglobin: 10.6 g/dL — ABNORMAL LOW (ref 13.0–17.1)
LYMPHS ABS: 1.4 10*3/uL (ref 0.9–3.3)
Lymphocytes Relative: 21 %
MCH: 29.9 pg (ref 27.2–33.4)
MCHC: 32.6 g/dL (ref 32.0–36.0)
MCV: 91.5 fL (ref 79.3–98.0)
MONOS PCT: 19 %
Monocytes Absolute: 1.3 10*3/uL — ABNORMAL HIGH (ref 0.1–0.9)
Neutro Abs: 4.1 10*3/uL (ref 1.5–6.5)
Neutrophils Relative %: 60 %
PLATELETS: 167 10*3/uL (ref 140–400)
RBC: 3.55 MIL/uL — ABNORMAL LOW (ref 4.20–5.82)
RDW: 14.5 % (ref 11.0–14.6)
WBC: 6.9 10*3/uL (ref 4.0–10.3)

## 2018-08-19 LAB — COMPREHENSIVE METABOLIC PANEL
AST: 14 U/L — ABNORMAL LOW (ref 15–41)
Albumin: 3.1 g/dL — ABNORMAL LOW (ref 3.5–5.0)
Alkaline Phosphatase: 67 U/L (ref 38–126)
Anion gap: 9 (ref 5–15)
BUN: 12 mg/dL (ref 6–20)
CHLORIDE: 99 mmol/L (ref 98–111)
CO2: 24 mmol/L (ref 22–32)
CREATININE: 0.95 mg/dL (ref 0.61–1.24)
Calcium: 9.4 mg/dL (ref 8.9–10.3)
GFR calc Af Amer: >60 mL/min (ref >60)
Glucose, Bld: 166 mg/dL — ABNORMAL HIGH (ref 70–99)
Potassium: 3.8 mmol/L (ref 3.5–5.1)
Sodium: 132 mmol/L — ABNORMAL LOW (ref 135–145)
Total Bilirubin: 0.7 mg/dL (ref 0.3–1.2)
Total Protein: 8.6 g/dL — ABNORMAL HIGH (ref 6.5–8.1)

## 2018-08-19 LAB — FERRITIN: FERRITIN: 426 ng/mL — AB (ref 24–336)

## 2018-08-19 LAB — CEA (IN HOUSE-CHCC): CEA (CHCC-In House): 16.77 ng/mL — ABNORMAL HIGH (ref 0.00–5.00)

## 2018-08-19 MED ORDER — MORPHINE SULFATE ER 15 MG PO TBCR: 15.0000 mg | EXTENDED_RELEASE_TABLET | Freq: Two times a day (BID) | ORAL | 0 refills | Status: DC

## 2018-08-19 MED ORDER — DIPHENOXYLATE-ATROPINE 2.5-0.025 MG PO TABS: 2.0000 | ORAL_TABLET | Freq: Four times a day (QID) | ORAL | 1 refills | Status: DC | PRN

## 2018-08-19 MED ORDER — OXYCODONE HCL 10 MG PO TABS: 10.0000 mg | ORAL_TABLET | Freq: Four times a day (QID) | ORAL | 0 refills | Status: DC | PRN

## 2018-08-19 MED ORDER — HEPARIN SOD (PORK) LOCK FLUSH 100 UNIT/ML IV SOLN
500.0000 [IU] | INTRAVENOUS | Status: DC | PRN
  Administered 2018-08-19: 500 [IU] via INTRAVENOUS
  Filled 2018-08-19: qty 5

## 2018-08-19 MED ORDER — SODIUM CHLORIDE 0.9% FLUSH
10.0000 mL | INTRAVENOUS | Status: DC | PRN
  Administered 2018-08-19: 10 mL via INTRAVENOUS
  Filled 2018-08-19: qty 10

## 2018-08-19 MED FILL — DIPHENOXYLATE-ATROPINE 2.5-: 2.5-0.025 | 12 days supply | Qty: 90 | Fill #0

## 2018-08-19 MED FILL — MORPHINE SULF ER 15 MG TAB: 15 | 30 days supply | Qty: 60 | Fill #0

## 2018-08-19 MED FILL — oxyCODONE HCL 10 MG TABS: 10 | 22 days supply | Qty: 90 | Fill #0

## 2018-08-19 NOTE — Progress Notes (Signed)
Spoke with York Spaniel SW regarding obtaining Ensure for patient, she will supply some today and have Dory Peru follow up with patient.

## 2018-08-19 NOTE — Telephone Encounter (Signed)
Scheduled appt per 8/23 los - spoke with daughter and she is aware of appts for 8/29 and 9/12 - reminder letter also sent in the mail with app date and time.

## 2018-08-19 NOTE — Telephone Encounter (Signed)
Scheduled appt per 8/23 los - unable to schedule next treatment due to cap - logged - will contact patient daughter when apt is scheduled.

## 2018-08-19 NOTE — Progress Notes (Signed)
[place] CSW Progress Note  Call from Dr [employer] desk nurse.  Requests patient get case of Ensure, unable to reach dietitian.  CSW reviewed chart, noted that patient has seen dietitian, has been given last case of Ensure approx June 2019.  Provided case for patient today, notified dietitian.  Edwyna Shell, LCSW Clinical Social Worker Phone:  [phone]

## 2018-08-25 ENCOUNTER — Inpatient Hospital Stay (HOSPITAL_BASED_OUTPATIENT_CLINIC_OR_DEPARTMENT_OTHER): Payer: Medicaid Other | Admitting: Medical

## 2018-08-25 ENCOUNTER — Inpatient Hospital Stay: Payer: Medicaid Other

## 2018-08-25 VITALS — BP 109/78 | HR 89 | Temp 98.4°F | Resp 16

## 2018-08-25 DIAGNOSIS — B370 Candidal stomatitis: Secondary | ICD-10-CM

## 2018-08-25 DIAGNOSIS — Z5111 Encounter for antineoplastic chemotherapy: Secondary | ICD-10-CM | POA: Diagnosis not present

## 2018-08-25 DIAGNOSIS — C189 Malignant neoplasm of colon, unspecified: Secondary | ICD-10-CM

## 2018-08-25 DIAGNOSIS — R066 Hiccough: Secondary | ICD-10-CM | POA: Diagnosis not present

## 2018-08-25 DIAGNOSIS — C186 Malignant neoplasm of descending colon: Secondary | ICD-10-CM

## 2018-08-25 DIAGNOSIS — C786 Secondary malignant neoplasm of retroperitoneum and peritoneum: Secondary | ICD-10-CM

## 2018-08-25 DIAGNOSIS — C762 Malignant neoplasm of abdomen: Secondary | ICD-10-CM

## 2018-08-25 LAB — COMPREHENSIVE METABOLIC PANEL
ALBUMIN: 2.7 g/dL — AB (ref 3.5–5.0)
ALT: 11 U/L (ref 0–44)
ANION GAP: 11 (ref 5–15)
AST: 24 U/L (ref 15–41)
Alkaline Phosphatase: 93 U/L (ref 38–126)
BILIRUBIN TOTAL: 0.3 mg/dL (ref 0.3–1.2)
BUN: 17 mg/dL (ref 6–20)
CO2: 27 mmol/L (ref 22–32)
Calcium: 9.6 mg/dL (ref 8.9–10.3)
Chloride: 95 mmol/L — ABNORMAL LOW (ref 98–111)
Creatinine, Ser: 0.82 mg/dL (ref 0.61–1.24)
GFR calc Af Amer: >60 mL/min (ref >60)
GFR calc non Af Amer: >60 mL/min (ref >60)
Glucose, Bld: 132 mg/dL — ABNORMAL HIGH (ref 70–99)
POTASSIUM: 3.9 mmol/L (ref 3.5–5.1)
Sodium: 133 mmol/L — ABNORMAL LOW (ref 135–145)
TOTAL PROTEIN: 9 g/dL — AB (ref 6.5–8.1)

## 2018-08-25 LAB — CBC WITH DIFFERENTIAL/PLATELET
BASOS PCT: 0 %
Basophils Absolute: 0 10*3/uL (ref 0.0–0.1)
EOS ABS: 0.1 10*3/uL (ref 0.0–0.5)
Eosinophils Relative: 1 %
HEMATOCRIT: 31.1 % — AB (ref 38.4–49.9)
HEMOGLOBIN: 10 g/dL — AB (ref 13.0–17.1)
LYMPHS PCT: 17 %
Lymphs Abs: 1.5 10*3/uL (ref 0.9–3.3)
MCH: 29.2 pg (ref 27.2–33.4)
MCHC: 32.2 g/dL (ref 32.0–36.0)
MCV: 90.9 fL (ref 79.3–98.0)
Monocytes Absolute: 1 10*3/uL — ABNORMAL HIGH (ref 0.1–0.9)
Monocytes Relative: 12 %
NEUTROS ABS: 6.2 10*3/uL (ref 1.5–6.5)
NEUTROS PCT: 70 %
Platelets: 251 10*3/uL (ref 140–400)
RBC: 3.42 MIL/uL — ABNORMAL LOW (ref 4.20–5.82)
RDW: 14.4 % (ref 11.0–14.6)
WBC: 8.9 10*3/uL (ref 4.0–10.3)

## 2018-08-25 MED ORDER — SODIUM CHLORIDE 0.9 % IV SOLN
2200.0000 mg/m2 | INTRAVENOUS | Status: DC
  Administered 2018-08-25: 3650 mg via INTRAVENOUS
  Filled 2018-08-25: qty 73

## 2018-08-25 MED ORDER — DEXAMETHASONE SODIUM PHOSPHATE 10 MG/ML IJ SOLN
10.0000 mg | Freq: Once | INTRAMUSCULAR | Status: AC
  Administered 2018-08-25: 10 mg via INTRAVENOUS

## 2018-08-25 MED ORDER — PALONOSETRON HCL INJECTION 0.25 MG/5ML
0.2500 mg | Freq: Once | INTRAVENOUS | Status: AC
  Administered 2018-08-25: 0.25 mg via INTRAVENOUS

## 2018-08-25 MED ORDER — SODIUM CHLORIDE 0.9 % IV SOLN
Freq: Once | INTRAVENOUS | Status: DC
  Filled 2018-08-25: qty 250

## 2018-08-25 MED ORDER — SODIUM CHLORIDE 0.9 % IV SOLN
Freq: Once | INTRAVENOUS | Status: AC
  Administered 2018-08-25: 10:00:00 via INTRAVENOUS
  Filled 2018-08-25: qty 250

## 2018-08-25 MED ORDER — IRINOTECAN HCL CHEMO INJECTION 100 MG/5ML
120.0000 mg/m2 | Freq: Once | INTRAVENOUS | Status: AC
  Administered 2018-08-25: 200 mg via INTRAVENOUS
  Filled 2018-08-25: qty 10

## 2018-08-25 MED ORDER — LEUCOVORIN CALCIUM INJECTION 350 MG
400.0000 mg/m2 | Freq: Once | INTRAVENOUS | Status: AC
  Administered 2018-08-25: 660 mg via INTRAVENOUS
  Filled 2018-08-25: qty 33

## 2018-08-25 MED ORDER — PALONOSETRON HCL INJECTION 0.25 MG/5ML
INTRAVENOUS | Status: AC
  Filled 2018-08-25: qty 5

## 2018-08-25 MED ORDER — SODIUM CHLORIDE 0.9 % IV SOLN
Freq: Once | INTRAVENOUS | Status: AC
  Administered 2018-08-25: 12:00:00 via INTRAVENOUS
  Filled 2018-08-25: qty 250

## 2018-08-25 MED ORDER — ATROPINE SULFATE 1 MG/ML IJ SOLN
0.5000 mg | Freq: Once | INTRAMUSCULAR | Status: AC | PRN
  Administered 2018-08-25: 0.5 mg via INTRAVENOUS

## 2018-08-25 MED ORDER — NYSTATIN 100000 UNIT/ML MT SUSP: 5.0000 mL | Freq: Four times a day (QID) | OROMUCOSAL | 0 refills | Status: DC

## 2018-08-25 MED ORDER — ATROPINE SULFATE 1 MG/ML IJ SOLN
INTRAMUSCULAR | Status: AC
  Filled 2018-08-25: qty 1

## 2018-08-25 MED ORDER — DEXAMETHASONE SODIUM PHOSPHATE 10 MG/ML IJ SOLN
INTRAMUSCULAR | Status: AC
  Filled 2018-08-25: qty 1

## 2018-08-25 MED ORDER — HEPARIN SOD (PORK) LOCK FLUSH 100 UNIT/ML IV SOLN
500.0000 [IU] | Freq: Once | INTRAVENOUS | Status: DC | PRN
  Filled 2018-08-25: qty 5

## 2018-08-25 MED ORDER — CHLORPROMAZINE HCL 10 MG PO TABS: 10.0000 mg | ORAL_TABLET | Freq: Three times a day (TID) | ORAL | 1 refills | Status: DC | PRN

## 2018-08-25 MED ORDER — SODIUM CHLORIDE 0.9% FLUSH
10.0000 mL | INTRAVENOUS | Status: DC | PRN
  Filled 2018-08-25: qty 10

## 2018-08-25 MED FILL — NYSTATIN 100,000 UNITS/ML S: 100000 | 12 days supply | Qty: 240 | Fill #0

## 2018-08-25 MED FILL — CHLORPROMAZINE 10 MG TABLET: 10 | 15 days supply | Qty: 45 | Fill #0

## 2018-08-25 NOTE — Patient Instructions (Signed)
[facility] Discharge Instructions for Patients Receiving Chemotherapy  Today you received the following chemotherapy agents:  Irinotecan, Leucovorin, and 5FU.   To help prevent nausea and vomiting after your treatment, we encourage you to take your nausea medication as directed.   If you develop nausea and vomiting that is not controlled by your nausea medication, call the clinic.   BELOW ARE SYMPTOMS THAT SHOULD BE REPORTED IMMEDIATELY:  *FEVER GREATER THAN 100.5 F  *CHILLS WITH OR WITHOUT FEVER  NAUSEA AND VOMITING THAT IS NOT CONTROLLED WITH YOUR NAUSEA MEDICATION  *UNUSUAL SHORTNESS OF BREATH  *UNUSUAL BRUISING OR BLEEDING  TENDERNESS IN MOUTH AND THROAT WITH OR WITHOUT PRESENCE OF ULCERS  *URINARY PROBLEMS  *BOWEL PROBLEMS  UNUSUAL RASH Items with * indicate a potential emergency and should be followed up as soon as possible.  Feel free to call the clinic should you have any questions or concerns. The clinic phone number is [phone].  Please show the CHEMO ALERT CARD at check-in to the Emergency Department and triage nurse.   

## 2018-08-26 MED FILL — PROCHLORPERAZINE 5 MG TAB: 5 | 7 days supply | Qty: 60 | Fill #1
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
## 2018-08-27 ENCOUNTER — Inpatient Hospital Stay: Payer: Medicaid Other

## 2018-08-27 VITALS — BP 99/75 | HR 87 | Temp 98.1°F | Resp 18 | Ht 69.0 in

## 2018-08-27 DIAGNOSIS — Z5111 Encounter for antineoplastic chemotherapy: Secondary | ICD-10-CM | POA: Diagnosis not present

## 2018-08-27 DIAGNOSIS — C189 Malignant neoplasm of colon, unspecified: Secondary | ICD-10-CM

## 2018-08-27 MED ORDER — SODIUM CHLORIDE 0.9% FLUSH
10.0000 mL | INTRAVENOUS | Status: DC | PRN
  Administered 2018-08-27: 10 mL
  Filled 2018-08-27: qty 10

## 2018-08-27 MED ORDER — HEPARIN SOD (PORK) LOCK FLUSH 100 UNIT/ML IV SOLN
500.0000 [IU] | Freq: Once | INTRAVENOUS | Status: AC | PRN
  Administered 2018-08-27: 500 [IU]
  Filled 2018-08-27: qty 5

## 2018-09-06 NOTE — Progress Notes (Signed)
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  Alkaline Phos 38 - 126 U/L 78 93 67  AST 15 - 41 U/L 10(L) 24 14(L)  ALT 0 - 44 U/L <6 11 <6    CEA (0-5NG/ML) 01/22/2017: 12.31 02/16/17: 21.24 03/16/2017: 15.14 04/26/2017: 3.37 05/25/2017: 4.31 06/22/2017: 5.53 07/11/17: 4.1 07/26/17: 4.24 08/31/2017: 5.83 10/04/17: 6.07 11/01/17: 7.97 11/29/17: 9.37 12/27/17: 8.01 01/24/18: 7.57 02/21/18: 9.14 03/21/18: 9.87 04/18/18: 13.41 05/16/18: 16.15 06/20/18: 13.06 07/18/18: 19.66 08/19/18: 16.77    PATHOLOGY Diagnosis 01/12/2017 1. Colon, biopsy, splenic flexure - INVASIVE ADENOCARCINOMA. 2. 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 - POLYPECTOMY RESECTION MARGIN IS NEGATIVE FOR DYSPLASIA.     RADIOGRAPHIC STUDIES: I have personally reviewed the radiological images as listed and agreed with the findings in the report.  07/07/2018 CT CAP W contrast IMPRESSION: 1. Mild interval increase in size of cystic mass within the splenic hilum. 2. Similar-appearing to mildly increased calcified soft tissue mass within the mesentery and wall thickening of the descending colon. 3. Aortic  Atherosclerosis (ICD10-I70.0) and Emphysema (ICD10-J43.9).  CT CAP W Contrast 03/31/18 IMPRESSION: 1. Interval increase in size, particularly craniocaudal extent, of the cystic mass within the splenic hilum. 2. Similar-appearing partially calcified soft tissue mass within the mesentery. 3. Interval increase in wall thickening of the descending colon.  CT AP WO Contrast 01/08/18 IMPRESSION: 1. Limited exam, without oral or IV contrast. 2. No evidence of abdominopelvic hemorrhage. 3. Small bilateral pleural effusions. 4. Grossly similar mass within the splenic hilum. 5.  Aortic Atherosclerosis (ICD10-I70.0).  Brain MRI WO Contrast 01/05/18 IMPRESSION: Negative MRI head with contrast.  Negative for metastatic disease.  CT AP W Contrast 12/30/17 IMPRESSION: 1. 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 However, there is now increasing thrombus within the left iliac and femoral veins with adjacent inflammation. 2. Mild circumferential bladder wall thickening which may be reactive or could indicate cystitis. Correlate clinically. 3. Unchanged complex cystic mass in the splenic hilum and focal wall thickening of the splenic flexure. 4.  Aortic Atherosclerosis (ICD10-I70.0).  CT CAP W Contrast 11/25/17 IMPRESSION: 1. Response to therapy of intraperitoneal metastasis, including within the splenic hilum. 2.  No acute process or evidence of metastatic disease in the chest. 3. Persistent splenic flexure colonic wall thickening, with right-sided colostomy in place. 4.  Aortic Atherosclerosis (ICD10-I70.0).  CT AP W Contrast 08/15/17 IMPRESSION: 1. Early or new small bowel obstruction with a transition point in the right abdomen as above. The adjacent swirling vessels are most consistent with an internal hernia. 2. The mass involving the pancreatic tail and spleen is similar in the interval. 3. The previously identified abscess inferior to the pancreatic mass has resolved with  minimal fluid in this region. The tube is been removed. 4. Continued thickening of the colon as above consistent with the patient's known malignancy. 5. Atherosclerotic changes in the aorta. Aortic Atherosclerosis (ICD10-I70.0).   CT AP W Contrast 07/29/17 IMPRESSION: 1. Bowel obstruction at the level of the proximal descending colon at the location of a percutaneous drainage catheter. This could be secondary to wall thickening by the  adjacent inflammatory process. Wall thickening due to colon neoplasm also remains a possibility. 2. Dilated, fluid-filled appendix, similar to the dilated loops of colon and small bowel, compatible with dilatation due to the bowel obstruction. There are no findings to indicate appendicitis. 3. Small amount of free peritoneal fluid. 4. The percutaneously drained fluid collection in the left mid abdomen has with resolved. 5. Mild decrease in size of the multiloculated fluid collection in the splenic hilum and distal tail of the pancreas. 6. Stable 8 mm diffusely enhancing mass in the dome of the liver on the right. This is most likely benign. A solitary enhancing metastasis is less likely. 7. Moderate prostatic hypertrophy.  CT CP W Contrast 07/10/17 IMPRESSION: The salient finding is a new abscess measuring 3.8 x 4.6 cm inferior to the pancreatic tail and spleen.  CT CAP w contrast 07/05/2017  IMPRESSION: 1. Today's study demonstrates some positive response to therapy with decreased size of mass associated with the descending colon, decreased size of the invasive mass extending from the tail of the pancreas into the splenic hilum and decreased size of adjacent peritoneal lesions. There has also been regression of upper abdominal lymphadenopathy. 2. No new pulmonary or hepatic lesions identified to suggest progressive metastatic disease. 3. Aortic atherosclerosis. 4. Additional incidental findings, as above.  CT CAP w Contrast  04/09/2017 IMPRESSION: 1. Response to therapy with decreased peritoneal tumor volume throughout the abdomen. 2. No well-defined residual colonic mass. No bowel obstruction or other acute complication. 3. Decrease in gastrohepatic ligament adenopathy. 4. No new sites of disease. 5.  No acute process or evidence of metastatic disease in the chest. 6.  Aortic atherosclerosis. 7. Mild gynecomastia.  Colonoscopy 01/12/2017 Impression: - Malignant near obstructing tumor in the descending colon and at the splenic flexure. Biopsied. Tattooed. - One 25 mm polyp at the recto-sigmoid colon, removed with a hot snare. Resected and retrieved. Clips (MR unsafe) were placed.  Lower Extremity Ultrasound 02/16/17 Bilateral lower extremity venous duplex complete. There is evidence of deep vein thrombosis involving the femoral, popliteal, and peroneal veins of the right lower extremity.  There is no evidence of superficial vein thrombosis involving the right lower extremity. There is evidence of superficial vein thrombosis involving the lesser saphenous vein of the left lower extremity. There is no evidence of deep vein thrombosis involving the left lower extremity. There is no evidence of a Baker's cyst bilaterally.  ASSESSMENT & PLAN: IRVING BLOOR is a 57 y.o.  African-American male, without a significant past medical history, no routine medical care, presented with intermittent rectal bleeding, anemia  and worsening abdominal pain for 3 years.   1. 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  -His case was previously reviewed in our GI tumor Board, unfortunately he has peritoneum metastasis, with a large 11 cm peritoneal metastasis in the left upper quadrant, which is consistent with peritoneal metastasis. -We  previously reviewed the natural history of metastatic colon cancer. Unfortunately his cancer is incurable at this stage. We discussed the goal of therapy is palliative, to prolong his life and improve his quality of life. -He started first line chemo FOLFOX on 02/01/17. Due to history of pancytopenia, I skipped the 5-FU bolus. -I previously discussed the [facility] genomic test result, which showed wild type KRAS, NRAS, and BRAF, MSI-stable. Based on this and his  left-sided colon cancer, he will significantly benefit from EGFR inhibitor. He has started on panitumumab and has been tolerating well. -I previously reviewed the CT scan from 04/09/17 with the patient in detail. He has had excellent partial response to the chemotherapy treatment, no other new lesions, we'll continue current chemotherapy regimen. -I reviewed his restaging CT scan from 07/05/2017, which showed a continuous response, no new lesions. We'll continue current treatment. -Due to his thrombosis and GI bleeding, he is not a candidate for Avastin for now. Given questionable disease progression, I recommend him to continue panitumumab only after cycle 11 on 07/06/18.  -Due to his recent bowel obstruction, I think he probably has had disease progression, although scan showed stable disease . I recommend to switch his chemotherapy to irinotecan alone, or FOLFIRI if he is able to tolerate. The goal of therapy is palliative. -We had again discussed the incurable nature of his cancer, an overall poor prognosis. The goal of therapy is palliative. -Started irinotecan and panitumumab on 09/21/17. Tolerated well with increased ostomy output. Added 5-fu back with cycle 2 on 10/04/17. Increased to full dose FOLFIRI on 10/18/17. Pt did not tolerate the cycle 5 FOLFIRI well, despite dose reduction on both agents. He is very reluctant to continue same regiment.  -Given his restaging CT scan showed mixed response, but overall stable disease,  I held  Irinotecan, and continue 5-FU/leucovorin and Panitumumab as maintenance therapy on 11/29/17. He has tolerated maintenance therapy well.  -During his episode of extensive left lower extremity DVT on 01/25/18, his treatment was held. Chemo treatment restarted after he recovered from DVT -He had a CT AP when he was in the hospital on January 08, 2018, which showed overall stable peritoneal metastasis. -CT CAP W Contrast from 03/31/18 revealed disease progression of the splenic hilum mass, now 7.7 cm from 5.2 cm. -He restarted FOLFIRI on 04/18/18, I do not think he is a candidate for Avastin due to his thrombosis. Vectibix was stopped due to disease progression. He is tolerating dose reduced FOLFIRI well, no worsening diarrhea, it is manageable. -I previously reviewed his restaging CT from 07/07/18 with patient and daughter which shows slow, but significant progression since 01/2018.  -He started third line chemo Lonsurf on 07/25/18  -As he continues Lonsurf his abdominal pain and fatigue worsened, with significant diarrhea, no appetite and weight loss. I do not think he can tolerate Lonsurf any further, and he clinically did not response (with worsening pain), so I stopped after one cycle.  -He has restarted FOLFIRI. Since his thrombosis has been very well controlled, he is tolerating Xarelto well, I will add Avastin to his regiment, starting today.  The potential benefit and side effects, especially thrombosis, hemorrhage, bowel perforation, hypertension, proteinuria, etc. were discussed with patient in details, he agrees to proceed. -I discussed referring him to [facility] for a clinical Trial. He is agreeable.  -F/u in 2 weeks     2. History of right LE DVT, and extensive LLE DVT  -I previously reviewed his ultrasound results from 02/17/17, which showed DVT involving the femoral, popliteal, and peritoneal vein of the right lower extremity from 2/22. -He had history of right lower extremity DVT post-surgery in  the past -His Xarelto has been held due to GI bleeding he has a IVC placement -No recurrence of bleeding since IVC placement.  -Pt was admitted to the hospital on 12/30/17 and discharged on 01/11/18. He presented with progressively worsening pain and swelling in the left leg. Imaging  studies notable for worsening left LE DVT. Initially hesitant to start anticoagulation due to known history of GI bleed while on Xarelto. Started on Eliquis and transferred to [facility] for thrombectomy. His left leg edema resolved after thrombectomy, he was discharged with Xarelto (he refused lovenox) 9m bid -He noticed recurrent GI bleeding around 01/25/2018, resolved after holding Xarelto.  Has restarted Xarelto at 20 mg daily dose, tolerating well without bleeding. -Continue Xarelto 15 mg daily, dose reduced due to her previous severe GI bleeding. -He experienced another episode of GI bleeding on Xarelto and held for 6 days to recover before restarting. I previously advised him to hold Xarelto as needed when he experiences GI Bleeding.   3. Abdominal and knee and leg pain   -He has had left abdominal pain due to his metastasis and then back pain related to his chemo -I previously called in Neurontin 1093mfor his shooting leg pain (11/15/17).  -pain controlled overall, he is on oxycodone 1051m-6 tab daily  -He is on Neurontin at night, he can take 200 mg for the next few weeks, and I previously changed dose to 300 mg TID.  -His abdominal pain continued to worsen on Lonsurf. I stopped his Lonsurf and prescribed his MS Contin q12hours (08/19/18). - I refilled his Oxycodone q6-8hours to take as needed for breakthrough pain. 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 -He has received IV Feraheme twice, but his anemia has not improved. He likely has component of anemia of chronic disease secondary to his underlying  malignancy. -Continue ferrous Sulfate  5. Weight loss, malnutrition, anorexia -Secondary to chemotherapy and cancer -The patient will continue to drink dietary supplements. -He has gained some weight lately - I again strongly encouraged him to try to eat more to avoid losing excess weight. I again recommended he drink 2 ensure or boost supplements a day to help. -I previously prescribed mirtazapine on 05/25/17 in attempts to help his appetite. He knows to take it once every evening. -Continue follow-up with dietitian  -On Lonsurf his appetite continued to decrease and began loosing weight. I stopped Lonsurf. He will continue Mirtazapine. His appetite will likely improve once abdominal pain is controlled.   6. History of pancreatic Abscess -Presented to hospital on 07/09/17 for abdominal pain. Based on SP CT from 07/10/17 he found to have an abscess. Draining tube placed and he received prolonged course of antibiotics  -His percutaneous drainage tube has been removed  -Resolved   7. Bowel obstruction due to his colon mass at the splenic flexure and surrounding inflammation due to recent abscess, s/p diverting colostomy  -He was hospitalized on 07/29/17 for SBO, resolved  -Bowel movements overall have been normal, but sometimes he has softer stools and are more loose.  -I again reviewed the usage of both imodium and colace and in what context he should be using them both for his stool consistencies.  -He has colostomy prolapse, he knows to avoid lifting and carry heavy things   8. Diarrhea  -Second to chemotherapy -Recommend him to take Lomotil 1 to 2 tablets 3 times daily before each meals, and use Imodium 2 tablets every 6-8 hours as needed in between. This controlled his diarrhea.  -Diarrhea had increased with Lonsurf. I stopped Lonsurf.  9. Goal of care discussion  -We previously discussed the incurable nature of his cancer, and the overall poor  prognosis, especially if he does not have  good response to chemotherapy or progress on chemo -The patient understands the goal of care is palliative. -I have previously recommended DNR/DNI, he will think about it.  10. Oral candidiasis:  -He saw Sandi Mealy PA-C for this on 08/25/2018 -The patient was given a prescription for nystatin swish and swallow. It helps   Plan: -I refilled Oxycodone today, he will need MS contin refill in 1-2 weeks  -Lab, flush and FOLFIRI and Avastin today -I will order urine test before starting Avastin -F/u in 2 weeks -will check with [facility] to see if they have clinical trial option for him    All questions were answered. The patient knows to call the clinic with any problems, questions or concerns.  I spent 20 minutes counseling the patient face to face. The total time spent in the appointment was 25 minutes and more than 50% was on counseling.  Dierdre Searles Dweik am acting as scribe for Dr. [name].  I have reviewed the above documentation for accuracy and completeness, and I agree with the above.     [name], MD 09/08/18

## 2018-09-08 ENCOUNTER — Inpatient Hospital Stay (HOSPITAL_BASED_OUTPATIENT_CLINIC_OR_DEPARTMENT_OTHER): Payer: Medicaid Other | Admitting: Hematology

## 2018-09-08 ENCOUNTER — Inpatient Hospital Stay: Payer: Medicaid Other | Attending: Hematology

## 2018-09-08 ENCOUNTER — Inpatient Hospital Stay: Payer: Medicaid Other

## 2018-09-08 ENCOUNTER — Inpatient Hospital Stay: Payer: Medicaid Other | Admitting: Nutrition

## 2018-09-08 ENCOUNTER — Encounter: Payer: Self-pay | Admitting: Hematology

## 2018-09-08 VITALS — BP 94/60 | HR 99 | Temp 99.6°F | Resp 18

## 2018-09-08 VITALS — BP 105/63 | HR 102 | Temp 98.2°F | Resp 17 | Ht 69.0 in | Wt 121.8 lb

## 2018-09-08 DIAGNOSIS — C762 Malignant neoplasm of abdomen: Secondary | ICD-10-CM

## 2018-09-08 DIAGNOSIS — M25511 Pain in right shoulder: Secondary | ICD-10-CM

## 2018-09-08 DIAGNOSIS — C189 Malignant neoplasm of colon, unspecified: Secondary | ICD-10-CM

## 2018-09-08 DIAGNOSIS — B370 Candidal stomatitis: Secondary | ICD-10-CM

## 2018-09-08 DIAGNOSIS — K922 Gastrointestinal hemorrhage, unspecified: Secondary | ICD-10-CM | POA: Diagnosis not present

## 2018-09-08 DIAGNOSIS — D630 Anemia in neoplastic disease: Secondary | ICD-10-CM | POA: Insufficient documentation

## 2018-09-08 DIAGNOSIS — Z79899 Other long term (current) drug therapy: Secondary | ICD-10-CM | POA: Diagnosis not present

## 2018-09-08 DIAGNOSIS — K9409 Other complications of colostomy: Secondary | ICD-10-CM

## 2018-09-08 DIAGNOSIS — D500 Iron deficiency anemia secondary to blood loss (chronic): Secondary | ICD-10-CM

## 2018-09-08 DIAGNOSIS — Z86718 Personal history of other venous thrombosis and embolism: Secondary | ICD-10-CM

## 2018-09-08 DIAGNOSIS — G893 Neoplasm related pain (acute) (chronic): Secondary | ICD-10-CM | POA: Diagnosis not present

## 2018-09-08 DIAGNOSIS — Z86711 Personal history of pulmonary embolism: Secondary | ICD-10-CM | POA: Insufficient documentation

## 2018-09-08 DIAGNOSIS — Z7901 Long term (current) use of anticoagulants: Secondary | ICD-10-CM

## 2018-09-08 DIAGNOSIS — C786 Secondary malignant neoplasm of retroperitoneum and peritoneum: Secondary | ICD-10-CM | POA: Diagnosis not present

## 2018-09-08 DIAGNOSIS — E876 Hypokalemia: Secondary | ICD-10-CM

## 2018-09-08 DIAGNOSIS — C186 Malignant neoplasm of descending colon: Secondary | ICD-10-CM

## 2018-09-08 DIAGNOSIS — Z5111 Encounter for antineoplastic chemotherapy: Secondary | ICD-10-CM | POA: Diagnosis present

## 2018-09-08 DIAGNOSIS — Z95828 Presence of other vascular implants and grafts: Secondary | ICD-10-CM

## 2018-09-08 LAB — CBC WITH DIFFERENTIAL/PLATELET
Basophils Absolute: 0.1 10*3/uL (ref 0.0–0.1)
Basophils Relative: 2 %
EOS ABS: 0.1 10*3/uL (ref 0.0–0.5)
EOS PCT: 3 %
HCT: 30.9 % — ABNORMAL LOW (ref 38.4–49.9)
HEMOGLOBIN: 9.9 g/dL — AB (ref 13.0–17.1)
LYMPHS PCT: 33 %
Lymphs Abs: 1.2 10*3/uL (ref 0.9–3.3)
MCH: 28.7 pg (ref 27.2–33.4)
MCHC: 32 g/dL (ref 32.0–36.0)
MCV: 89.8 fL (ref 79.3–98.0)
MONOS PCT: 12 %
Monocytes Absolute: 0.4 10*3/uL (ref 0.1–0.9)
Neutro Abs: 1.8 10*3/uL (ref 1.5–6.5)
Neutrophils Relative %: 50 %
PLATELETS: 161 10*3/uL (ref 140–400)
RBC: 3.44 MIL/uL — AB (ref 4.20–5.82)
RDW: 15.7 % — ABNORMAL HIGH (ref 11.0–14.6)
WBC: 3.6 10*3/uL — AB (ref 4.0–10.3)

## 2018-09-08 LAB — COMPREHENSIVE METABOLIC PANEL
ALT: <6 U/L (ref 0–44)
AST: 10 U/L — AB (ref 15–41)
Albumin: 2.5 g/dL — ABNORMAL LOW (ref 3.5–5.0)
Alkaline Phosphatase: 78 U/L (ref 38–126)
Anion gap: 10 (ref 5–15)
BUN: 7 mg/dL (ref 6–20)
CHLORIDE: 102 mmol/L (ref 98–111)
CO2: 24 mmol/L (ref 22–32)
CREATININE: 0.74 mg/dL (ref 0.61–1.24)
Calcium: 9.5 mg/dL (ref 8.9–10.3)
GFR calc Af Amer: >60 mL/min (ref >60)
GFR calc non Af Amer: >60 mL/min (ref >60)
Glucose, Bld: 138 mg/dL — ABNORMAL HIGH (ref 70–99)
Potassium: 3.9 mmol/L (ref 3.5–5.1)
SODIUM: 136 mmol/L (ref 135–145)
Total Bilirubin: 0.3 mg/dL (ref 0.3–1.2)
Total Protein: 8.2 g/dL — ABNORMAL HIGH (ref 6.5–8.1)

## 2018-09-08 LAB — IRON AND TIBC
Iron: 9 ug/dL — ABNORMAL LOW (ref 42–163)
Saturation Ratios: 5 % — ABNORMAL LOW (ref 42–163)
TIBC: 170 ug/dL — ABNORMAL LOW (ref 202–409)
UIBC: 161 ug/dL

## 2018-09-08 LAB — FERRITIN: Ferritin: 432 ng/mL — ABNORMAL HIGH (ref 24–336)

## 2018-09-08 LAB — TOTAL PROTEIN, URINE DIPSTICK: Protein, ur: NEGATIVE mg/dL

## 2018-09-08 LAB — CEA (IN HOUSE-CHCC): CEA (CHCC-IN HOUSE): 13.97 ng/mL — AB (ref 0.00–5.00)

## 2018-09-08 MED ORDER — LEUCOVORIN CALCIUM INJECTION 350 MG
400.0000 mg/m2 | Freq: Once | INTRAVENOUS | Status: AC
  Administered 2018-09-08: 660 mg via INTRAVENOUS
  Filled 2018-09-08: qty 33

## 2018-09-08 MED ORDER — IRINOTECAN HCL CHEMO INJECTION 100 MG/5ML
120.0000 mg/m2 | Freq: Once | INTRAVENOUS | Status: AC
  Administered 2018-09-08: 200 mg via INTRAVENOUS
  Filled 2018-09-08: qty 10

## 2018-09-08 MED ORDER — SODIUM CHLORIDE 0.9 % IV SOLN
5.5000 mg/kg | Freq: Once | INTRAVENOUS | Status: AC
  Administered 2018-09-08: 300 mg via INTRAVENOUS
  Filled 2018-09-08: qty 12

## 2018-09-08 MED ORDER — PALONOSETRON HCL INJECTION 0.25 MG/5ML
0.2500 mg | Freq: Once | INTRAVENOUS | Status: AC
  Administered 2018-09-08: 0.25 mg via INTRAVENOUS

## 2018-09-08 MED ORDER — ATROPINE SULFATE 1 MG/ML IJ SOLN
0.5000 mg | Freq: Once | INTRAMUSCULAR | Status: AC | PRN
  Administered 2018-09-08: 0.5 mg via INTRAVENOUS

## 2018-09-08 MED ORDER — POTASSIUM CHLORIDE CRYS ER 20 MEQ PO TBCR: 20.0000 meq | EXTENDED_RELEASE_TABLET | Freq: Every day | ORAL | 2 refills | Status: DC

## 2018-09-08 MED ORDER — DEXAMETHASONE SODIUM PHOSPHATE 10 MG/ML IJ SOLN
10.0000 mg | Freq: Once | INTRAMUSCULAR | Status: AC
  Administered 2018-09-08: 10 mg via INTRAVENOUS

## 2018-09-08 MED ORDER — SODIUM CHLORIDE 0.9 % IV SOLN
2200.0000 mg/m2 | INTRAVENOUS | Status: DC
  Administered 2018-09-08: 3650 mg via INTRAVENOUS
  Filled 2018-09-08: qty 73

## 2018-09-08 MED ORDER — ATROPINE SULFATE 1 MG/ML IJ SOLN
INTRAMUSCULAR | Status: AC
  Filled 2018-09-08: qty 1

## 2018-09-08 MED ORDER — SODIUM CHLORIDE 0.9 % IV SOLN
Freq: Once | INTRAVENOUS | Status: AC
  Administered 2018-09-08: 09:00:00 via INTRAVENOUS
  Filled 2018-09-08: qty 250

## 2018-09-08 MED ORDER — PALONOSETRON HCL INJECTION 0.25 MG/5ML
INTRAVENOUS | Status: AC
  Filled 2018-09-08: qty 5

## 2018-09-08 MED ORDER — DEXAMETHASONE SODIUM PHOSPHATE 10 MG/ML IJ SOLN
INTRAMUSCULAR | Status: AC
  Filled 2018-09-08: qty 1

## 2018-09-08 MED ORDER — OXYCODONE HCL 10 MG PO TABS: 10.0000 mg | ORAL_TABLET | Freq: Four times a day (QID) | ORAL | 0 refills | Status: DC | PRN

## 2018-09-08 MED ORDER — SODIUM CHLORIDE 0.9% FLUSH
10.0000 mL | INTRAVENOUS | Status: DC | PRN
  Administered 2018-09-08: 10 mL via INTRAVENOUS
  Filled 2018-09-08: qty 10

## 2018-09-08 MED ORDER — RIVAROXABAN 15 MG PO TABS: 15.0000 mg | ORAL_TABLET | Freq: Every day | ORAL | 3 refills | Status: DC

## 2018-09-08 MED FILL — XARELTO 15 MG TABLET: 15 | 30 days supply | Qty: 30 | Fill #0

## 2018-09-08 MED FILL — POTASSIUM CL ER 20 MEQ TABL: 20 | 30 days supply | Qty: 30 | Fill #0

## 2018-09-08 MED FILL — oxyCODONE HCL 10 MG TABS: 10 | 23 days supply | Qty: 90 | Fill #0

## 2018-09-08 NOTE — Patient Instructions (Signed)
[facility] Discharge Instructions for Patients Receiving Chemotherapy  Today you received the following chemotherapy agents: Bevacizumab (Avastin), Irinotecan (Camptosar), Leucovorin, and Fluorouracil (Adrucil, 5-FU)  To help prevent nausea and vomiting after your treatment, we encourage you to take your nausea medication as directed. Received Aloxi during treatment today-->Take Compazine (not Zofran) for the next 3 days as needed.    If you develop nausea and vomiting that is not controlled by your nausea medication, call the clinic.   BELOW ARE SYMPTOMS THAT SHOULD BE REPORTED IMMEDIATELY:  *FEVER GREATER THAN 100.5 F  *CHILLS WITH OR WITHOUT FEVER  NAUSEA AND VOMITING THAT IS NOT CONTROLLED WITH YOUR NAUSEA MEDICATION  *UNUSUAL SHORTNESS OF BREATH  *UNUSUAL BRUISING OR BLEEDING  TENDERNESS IN MOUTH AND THROAT WITH OR WITHOUT PRESENCE OF ULCERS  *URINARY PROBLEMS  *BOWEL PROBLEMS  UNUSUAL RASH Items with * indicate a potential emergency and should be followed up as soon as possible.  Feel free to call the clinic should you have any questions or concerns. The clinic phone number is [phone].  Please show the [facility] at check-in to the Emergency Department and triage nurse.  Bevacizumab injection What is this medicine? BEVACIZUMAB (be va SIZ yoo mab) is a monoclonal antibody. It is used to treat many types of cancer. This medicine may be used for other purposes; ask your health care provider or pharmacist if you have questions. COMMON BRAND NAME(S): Avastin What should I tell my health care provider before I take this medicine? They need to know if you have any of these conditions: -diabetes -heart disease -high blood pressure -history of coughing up blood -prior anthracycline chemotherapy (e.g., doxorubicin, daunorubicin, epirubicin) -recent or ongoing radiation therapy -recent or planning to have surgery -stroke -an unusual or allergic  reaction to bevacizumab, hamster proteins, mouse proteins, other medicines, foods, dyes, or preservatives -pregnant or trying to get pregnant -breast-feeding How should I use this medicine? This medicine is for infusion into a vein. It is given by a health care professional in a hospital or clinic setting. Talk to your pediatrician regarding the use of this medicine in children. Special care may be needed. Overdosage: If you think you have taken too much of this medicine contact a poison control center or emergency room at once. NOTE: This medicine is only for you. Do not share this medicine with others. What if I miss a dose? It is important not to miss your dose. Call your doctor or health care professional if you are unable to keep an appointment. What may interact with this medicine? Interactions are not expected. This list may not describe all possible interactions. Give your health care provider a list of all the medicines, herbs, non-prescription drugs, or dietary supplements you use. Also tell them if you smoke, drink alcohol, or use illegal drugs. Some items may interact with your medicine. What should I watch for while using this medicine? Your condition will be monitored carefully while you are receiving this medicine. You will need important blood work and urine testing done while you are taking this medicine. This medicine may increase your risk to bruise or bleed. Call your doctor or health care professional if you notice any unusual bleeding. This medicine should be started at least 28 days following major surgery and the site of the surgery should be totally healed. Check with your doctor before scheduling dental work or surgery while you are receiving this treatment. Talk to your doctor if you have recently  had surgery or if you have a wound that has not healed. Do not become pregnant while taking this medicine or for 6 months after stopping it. Women should inform their doctor if  they wish to become pregnant or think they might be pregnant. There is a potential for serious side effects to an unborn child. Talk to your health care professional or pharmacist for more information. Do not breast-feed an infant while taking this medicine and for 6 months after the last dose. This medicine has caused ovarian failure in some women. This medicine may interfere with the ability to have a child. You should talk to your doctor or health care professional if you are concerned about your fertility. What side effects may I notice from receiving this medicine? Side effects that you should report to your doctor or health care professional as soon as possible: -allergic reactions like skin rash, itching or hives, swelling of the face, lips, or tongue -chest pain or chest tightness -chills -coughing up blood -high fever -seizures -severe constipation -signs and symptoms of bleeding such as bloody or black, tarry stools; red or dark-brown urine; spitting up blood or brown material that looks like coffee grounds; red spots on the skin; unusual bruising or bleeding from the eye, gums, or nose -signs and symptoms of a blood clot such as breathing problems; chest pain; severe, sudden headache; pain, swelling, warmth in the leg -signs and symptoms of a stroke like changes in vision; confusion; trouble speaking or understanding; severe headaches; sudden numbness or weakness of the face, arm or leg; trouble walking; dizziness; loss of balance or coordination -stomach pain -sweating -swelling of legs or ankles -vomiting -weight gain Side effects that usually do not require medical attention (report to your doctor or health care professional if they continue or are bothersome): -back pain -changes in taste -decreased appetite -dry skin -nausea -tiredness This list may not describe all possible side effects. Call your doctor for medical advice about side effects. You may report side effects to  FDA at 1-800-FDA-1088. Where should I keep my medicine? This drug is given in a hospital or clinic and will not be stored at home. NOTE: This sheet is a summary. It may not cover all possible information. If you have questions about this medicine, talk to your doctor, pharmacist, or health care provider.  2018 Elsevier/Gold Standard (2016-12-11 14:33:29)

## 2018-09-08 NOTE — Progress Notes (Signed)
Nutrition follow-up completed with patient during infusion for metastatic colon cancer. Patient continues to have very little to offer during consultation. Weight has decreased to 121.8 pounds from 123.1 pounds June 24. Patient reports he continues to drink Ensure Plus.  Nutrition diagnosis: Inadequate oral intake continue  Intervention: Provided support and encouragement for patient to increase oral intake as tolerated. Provided coupons for oral nutrition supplements.  Monitoring, evaluation, goals: Patient will work to increase calories and protein to minimize weight loss.  Next visit to be scheduled as needed.  **Disclaimer: This note was dictated with voice recognition software. Similar sounding words can inadvertently be transcribed and this note may contain transcription errors which may not have been corrected upon publication of note.**

## 2018-09-08 NOTE — Progress Notes (Signed)
Spoke with Ward in pharmacy :OK to release Avastin and Folfiri treatment plans and obtain urine sample for urine protein before pt leaves.

## 2018-09-08 NOTE — Progress Notes (Signed)
Per [name] patient will receive Avastin as well, needs urine sample can be done in Infusion,  Information given to [place] RN in Infusion.

## 2018-09-10 ENCOUNTER — Inpatient Hospital Stay: Payer: Medicaid Other

## 2018-09-10 VITALS — BP 101/73 | HR 79 | Temp 98.4°F | Resp 17

## 2018-09-10 DIAGNOSIS — C189 Malignant neoplasm of colon, unspecified: Secondary | ICD-10-CM

## 2018-09-10 DIAGNOSIS — Z5111 Encounter for antineoplastic chemotherapy: Secondary | ICD-10-CM | POA: Diagnosis not present

## 2018-09-10 MED ORDER — SODIUM CHLORIDE 0.9% FLUSH
10.0000 mL | INTRAVENOUS | Status: DC | PRN
  Administered 2018-09-10: 10 mL
  Filled 2018-09-10: qty 10

## 2018-09-10 MED ORDER — HEPARIN SOD (PORK) LOCK FLUSH 100 UNIT/ML IV SOLN
500.0000 [IU] | Freq: Once | INTRAVENOUS | Status: AC | PRN
  Administered 2018-09-10: 500 [IU]
  Filled 2018-09-10: qty 5

## 2018-09-10 NOTE — Patient Instructions (Signed)
Implanted Port Home Guide An implanted port is a type of central line that is placed under the skin. Central lines are used to provide IV access when treatment or nutrition needs to be given through a person's veins. Implanted ports are used for long-term IV access. An implanted port may be placed because:  You need IV medicine that would be irritating to the small veins in your hands or arms.  You need long-term IV medicines, such as antibiotics.  You need IV nutrition for a long period.  You need frequent blood draws for lab tests.  You need dialysis.  Implanted ports are usually placed in the chest area, but they can also be placed in the upper arm, the abdomen, or the leg. An implanted port has two main parts:  Reservoir. The reservoir is round and will appear as a small, raised area under your skin. The reservoir is the part where a needle is inserted to give medicines or draw blood.  Catheter. The catheter is a thin, flexible tube that extends from the reservoir. The catheter is placed into a large vein. Medicine that is inserted into the reservoir goes into the catheter and then into the vein.  How will I care for my incision site? Do not get the incision site wet. Bathe or shower as directed by your health care provider. How is my port accessed? Special steps must be taken to access the port:  Before the port is accessed, a numbing cream can be placed on the skin. This helps numb the skin over the port site.  Your health care provider uses a sterile technique to access the port. ? Your health care provider must put on a mask and sterile gloves. ? The skin over your port is cleaned carefully with an antiseptic and allowed to dry. ? The port is gently pinched between sterile gloves, and a needle is inserted into the port.  Only "non-coring" port needles should be used to access the port. Once the port is accessed, a blood return should be checked. This helps ensure that the port  is in the vein and is not clogged.  If your port needs to remain accessed for a constant infusion, a clear (transparent) bandage will be placed over the needle site. The bandage and needle will need to be changed every week, or as directed by your health care provider.  Keep the bandage covering the needle clean and dry. Do not get it wet. Follow your health care provider's instructions on how to take a shower or bath while the port is accessed.  If your port does not need to stay accessed, no bandage is needed over the port.  What is flushing? Flushing helps keep the port from getting clogged. Follow your health care provider's instructions on how and when to flush the port. Ports are usually flushed with saline solution or a medicine called heparin. The need for flushing will depend on how the port is used.  If the port is used for intermittent medicines or blood draws, the port will need to be flushed: ? After medicines have been given. ? After blood has been drawn. ? As part of routine maintenance.  If a constant infusion is running, the port may not need to be flushed.  How long will my port stay implanted? The port can stay in for as long as your health care provider thinks it is needed. When it is time for the port to come out, surgery will be   done to remove it. The procedure is similar to the one performed when the port was put in. When should I seek immediate medical care? When you have an implanted port, you should seek immediate medical care if:  You notice a bad smell coming from the incision site.  You have swelling, redness, or drainage at the incision site.  You have more swelling or pain at the port site or the surrounding area.  You have a fever that is not controlled with medicine.  This information is not intended to replace advice given to you by your health care provider. Make sure you discuss any questions you have with your health care provider. Document  Released: 12/14/2005 Document Revised: 05/21/2016 Document Reviewed: 08/21/2013 Elsevier Interactive Patient Education  2017 [employer]  

## 2018-09-12 ENCOUNTER — Other Ambulatory Visit: Payer: Self-pay

## 2018-09-12 ENCOUNTER — Telehealth: Payer: Self-pay | Admitting: Hematology

## 2018-09-12 ENCOUNTER — Emergency Department (HOSPITAL_COMMUNITY): Payer: Medicaid Other

## 2018-09-12 ENCOUNTER — Encounter (HOSPITAL_COMMUNITY): Payer: Self-pay | Admitting: Emergency Medicine

## 2018-09-12 ENCOUNTER — Inpatient Hospital Stay (HOSPITAL_COMMUNITY)
Admission: EM | Admit: 2018-09-12 | Discharge: 2018-09-16 | DRG: 808 | Disposition: A | Discharge status: Home / Self Care | Payer: Medicaid Other | Attending: Family Medicine | Admitting: Family Medicine

## 2018-09-12 DIAGNOSIS — D61810 Antineoplastic chemotherapy induced pancytopenia: Secondary | ICD-10-CM | POA: Diagnosis present

## 2018-09-12 DIAGNOSIS — K521 Toxic gastroenteritis and colitis: Secondary | ICD-10-CM | POA: Diagnosis present

## 2018-09-12 DIAGNOSIS — Z8589 Personal history of malignant neoplasm of other organs and systems: Secondary | ICD-10-CM | POA: Diagnosis not present

## 2018-09-12 DIAGNOSIS — E43 Unspecified severe protein-calorie malnutrition: Secondary | ICD-10-CM | POA: Diagnosis present

## 2018-09-12 DIAGNOSIS — Z7901 Long term (current) use of anticoagulants: Secondary | ICD-10-CM

## 2018-09-12 DIAGNOSIS — D630 Anemia in neoplastic disease: Secondary | ICD-10-CM | POA: Diagnosis present

## 2018-09-12 DIAGNOSIS — Z95828 Presence of other vascular implants and grafts: Secondary | ICD-10-CM | POA: Diagnosis not present

## 2018-09-12 DIAGNOSIS — Z79899 Other long term (current) drug therapy: Secondary | ICD-10-CM

## 2018-09-12 DIAGNOSIS — D701 Agranulocytosis secondary to cancer chemotherapy: Secondary | ICD-10-CM | POA: Diagnosis not present

## 2018-09-12 DIAGNOSIS — Z86718 Personal history of other venous thrombosis and embolism: Secondary | ICD-10-CM

## 2018-09-12 DIAGNOSIS — Z933 Colostomy status: Secondary | ICD-10-CM | POA: Diagnosis not present

## 2018-09-12 DIAGNOSIS — E860 Dehydration: Secondary | ICD-10-CM | POA: Diagnosis present

## 2018-09-12 DIAGNOSIS — R509 Fever, unspecified: Secondary | ICD-10-CM | POA: Diagnosis present

## 2018-09-12 DIAGNOSIS — Z9221 Personal history of antineoplastic chemotherapy: Secondary | ICD-10-CM | POA: Diagnosis not present

## 2018-09-12 DIAGNOSIS — B370 Candidal stomatitis: Secondary | ICD-10-CM | POA: Diagnosis present

## 2018-09-12 DIAGNOSIS — D649 Anemia, unspecified: Secondary | ICD-10-CM

## 2018-09-12 DIAGNOSIS — R502 Drug induced fever: Secondary | ICD-10-CM | POA: Diagnosis present

## 2018-09-12 DIAGNOSIS — R6510 Systemic inflammatory response syndrome (SIRS) of non-infectious origin without acute organ dysfunction: Secondary | ICD-10-CM | POA: Diagnosis present

## 2018-09-12 DIAGNOSIS — T451X5A Adverse effect of antineoplastic and immunosuppressive drugs, initial encounter: Secondary | ICD-10-CM | POA: Diagnosis present

## 2018-09-12 DIAGNOSIS — E440 Moderate protein-calorie malnutrition: Secondary | ICD-10-CM | POA: Diagnosis not present

## 2018-09-12 DIAGNOSIS — C189 Malignant neoplasm of colon, unspecified: Secondary | ICD-10-CM | POA: Diagnosis not present

## 2018-09-12 DIAGNOSIS — Z85038 Personal history of other malignant neoplasm of large intestine: Secondary | ICD-10-CM | POA: Diagnosis not present

## 2018-09-12 DIAGNOSIS — C7889 Secondary malignant neoplasm of other digestive organs: Secondary | ICD-10-CM | POA: Diagnosis present

## 2018-09-12 DIAGNOSIS — A419 Sepsis, unspecified organism: Secondary | ICD-10-CM | POA: Diagnosis not present

## 2018-09-12 DIAGNOSIS — Z79891 Long term (current) use of opiate analgesic: Secondary | ICD-10-CM | POA: Diagnosis not present

## 2018-09-12 LAB — COMPREHENSIVE METABOLIC PANEL
ALT: 8 U/L (ref 0–44)
ANION GAP: 11 (ref 5–15)
AST: 21 U/L (ref 15–41)
Albumin: 2.3 g/dL — ABNORMAL LOW (ref 3.5–5.0)
Alkaline Phosphatase: 64 U/L (ref 38–126)
BUN: 12 mg/dL (ref 6–20)
CHLORIDE: 101 mmol/L (ref 98–111)
CO2: 22 mmol/L (ref 22–32)
Calcium: 8.3 mg/dL — ABNORMAL LOW (ref 8.9–10.3)
Creatinine, Ser: 0.89 mg/dL (ref 0.61–1.24)
Glucose, Bld: 158 mg/dL — ABNORMAL HIGH (ref 70–99)
POTASSIUM: 3.7 mmol/L (ref 3.5–5.1)
SODIUM: 134 mmol/L — AB (ref 135–145)
Total Bilirubin: 0.4 mg/dL (ref 0.3–1.2)
Total Protein: 6.8 g/dL (ref 6.5–8.1)

## 2018-09-12 LAB — CBC WITH DIFFERENTIAL/PLATELET
BASOS ABS: 0 10*3/uL (ref 0.0–0.1)
Basophils Relative: 1 %
Eosinophils Absolute: 0 10*3/uL (ref 0.0–0.7)
Eosinophils Relative: 1 %
HEMATOCRIT: 26.3 % — AB (ref 39.0–52.0)
HEMOGLOBIN: 8.7 g/dL — AB (ref 13.0–17.0)
LYMPHS ABS: 0.2 10*3/uL — AB (ref 0.7–4.0)
LYMPHS PCT: 11 %
MCH: 28.7 pg (ref 26.0–34.0)
MCHC: 33.1 g/dL (ref 30.0–36.0)
MCV: 86.8 fL (ref 78.0–100.0)
Monocytes Absolute: 0 10*3/uL — ABNORMAL LOW (ref 0.1–1.0)
Monocytes Relative: 1 %
NEUTROS ABS: 1.4 10*3/uL — AB (ref 1.7–7.7)
NEUTROS PCT: 86 %
Platelets: 161 10*3/uL (ref 150–400)
RBC: 3.03 MIL/uL — AB (ref 4.22–5.81)
RDW: 15.4 % (ref 11.5–15.5)
WBC: 1.6 10*3/uL — AB (ref 4.0–10.5)

## 2018-09-12 LAB — PROTIME-INR
INR: 2.35
Prothrombin Time: 25.5 seconds — ABNORMAL HIGH (ref 11.4–15.2)

## 2018-09-12 LAB — I-STAT CG4 LACTIC ACID, ED: LACTIC ACID, VENOUS: 2.81 mmol/L — AB (ref 0.5–1.9)

## 2018-09-12 MED ORDER — LACTATED RINGERS IV BOLUS (SEPSIS)
250.0000 mL | Freq: Once | INTRAVENOUS | Status: AC
  Administered 2018-09-12: 250 mL via INTRAVENOUS

## 2018-09-12 MED ORDER — METRONIDAZOLE IN NACL 5-0.79 MG/ML-% IV SOLN: 500.0000 mg | Freq: Three times a day (TID) | INTRAVENOUS | Status: DC

## 2018-09-12 MED ORDER — SODIUM CHLORIDE 0.9 % IV SOLN
2.0000 g | Freq: Once | INTRAVENOUS | Status: AC
  Administered 2018-09-12: 2 g via INTRAVENOUS
  Filled 2018-09-12: qty 2

## 2018-09-12 MED ORDER — LACTATED RINGERS IV BOLUS (SEPSIS)
1000.0000 mL | Freq: Once | INTRAVENOUS | Status: AC
  Administered 2018-09-12: 1000 mL via INTRAVENOUS

## 2018-09-12 MED ORDER — ACETAMINOPHEN 500 MG PO TABS: 1000.0000 mg | ORAL_TABLET | Freq: Once | ORAL | Status: DC

## 2018-09-12 MED ORDER — SODIUM CHLORIDE 0.9 % IV BOLUS
1000.0000 mL | Freq: Once | INTRAVENOUS | Status: AC
  Administered 2018-09-12: 1000 mL via INTRAVENOUS

## 2018-09-12 MED ORDER — VANCOMYCIN HCL IN DEXTROSE 1-5 GM/200ML-% IV SOLN
1000.0000 mg | Freq: Once | INTRAVENOUS | Status: AC
  Administered 2018-09-12: 1000 mg via INTRAVENOUS
  Filled 2018-09-12: qty 200

## 2018-09-12 MED ORDER — METRONIDAZOLE IN NACL 5-0.79 MG/ML-% IV SOLN
500.0000 mg | Freq: Three times a day (TID) | INTRAVENOUS | Status: DC
  Administered 2018-09-12 – 2018-09-16 (×11): 500 mg via INTRAVENOUS
  Filled 2018-09-12 (×11): qty 100

## 2018-09-12 MED ORDER — LACTATED RINGERS IV BOLUS (SEPSIS)
500.0000 mL | Freq: Once | INTRAVENOUS | Status: AC
  Administered 2018-09-12: 500 mL via INTRAVENOUS
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
## 2018-09-12 NOTE — Progress Notes (Signed)
A consult was received from an ED provider for cefepime and vancomycin per pharmacy dosing.  The patient's profile has been reviewed for ht/wt/allergies/indication/available labs.    A one time order has been placed for vancomycin 1000 mg IV and cefepime 2 g IV once.  Further antibiotics/pharmacy consults should be ordered by admitting physician if indicated.                       Thank you, Lenis Noon, PharmD, BCPS Clinical Pharmacist 09/12/2018  9:34 PM

## 2018-09-12 NOTE — ED Provider Notes (Signed)
  Face-to-face evaluation   History: Patient complains of fever and chills for 2 days, following chemotherapy, 2 days ago.  Decreased appetite for several days.  He is being treated for metastatic colon cancer.  Physical exam: Malnourished patient appears older than stated age.  Oral mucous members are dry, with white plaques.  He is lucid.  No respiratory distress.  Medical screening examination/treatment/procedure(s) were conducted as a shared visit with non-physician practitioner(s) and myself.  I personally evaluated the patient during the encounter    Daleen Bo, MD 09/15/18 2149

## 2018-09-12 NOTE — ED Triage Notes (Addendum)
Per EMS, patient from home, weakness and fever x2 days. Reports decreased appetite x3 days. Hx stage 4 colon cancer. Reports last chemo last week.   18g L FA 558ml NS with EMS 1000mg  tylenol with EMS at 1950 BP 92/50 HR 120 CBG 134

## 2018-09-12 NOTE — ED Notes (Signed)
CXR at bedside

## 2018-09-12 NOTE — ED Notes (Signed)
ED Provider at bedside. 

## 2018-09-12 NOTE — ED Notes (Addendum)
Ed provider Eulis Foster notified that patient has critical lactic acid value of 2.81

## 2018-09-12 NOTE — ED Notes (Signed)
Patient provided with urinal and made aware urine sample is needed. 
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
## 2018-09-12 NOTE — Telephone Encounter (Signed)
No los 9/12 °

## 2018-09-13 ENCOUNTER — Encounter (HOSPITAL_COMMUNITY): Payer: Self-pay

## 2018-09-13 ENCOUNTER — Inpatient Hospital Stay (HOSPITAL_COMMUNITY): Payer: Medicaid Other

## 2018-09-13 ENCOUNTER — Other Ambulatory Visit: Payer: Self-pay

## 2018-09-13 DIAGNOSIS — E860 Dehydration: Secondary | ICD-10-CM | POA: Diagnosis present

## 2018-09-13 DIAGNOSIS — C189 Malignant neoplasm of colon, unspecified: Secondary | ICD-10-CM

## 2018-09-13 LAB — RESPIRATORY PANEL BY PCR
ADENOVIRUS-RVPPCR: NOT DETECTED
BORDETELLA PERTUSSIS-RVPCR: NOT DETECTED
CORONAVIRUS 229E-RVPPCR: NOT DETECTED
CORONAVIRUS HKU1-RVPPCR: NOT DETECTED
CORONAVIRUS NL63-RVPPCR: NOT DETECTED
Chlamydophila pneumoniae: NOT DETECTED
Coronavirus OC43: NOT DETECTED
Influenza A: NOT DETECTED
Influenza B: NOT DETECTED
Metapneumovirus: NOT DETECTED
Mycoplasma pneumoniae: NOT DETECTED
PARAINFLUENZA VIRUS 3-RVPPCR: NOT DETECTED
Parainfluenza Virus 1: NOT DETECTED
Parainfluenza Virus 2: NOT DETECTED
Parainfluenza Virus 4: NOT DETECTED
RHINOVIRUS / ENTEROVIRUS - RVPPCR: NOT DETECTED
Respiratory Syncytial Virus: NOT DETECTED

## 2018-09-13 LAB — URINALYSIS, ROUTINE W REFLEX MICROSCOPIC
BILIRUBIN URINE: NEGATIVE
Glucose, UA: NEGATIVE mg/dL
Hgb urine dipstick: NEGATIVE
KETONES UR: NEGATIVE mg/dL
LEUKOCYTES UA: NEGATIVE
NITRITE: NEGATIVE
Protein, ur: NEGATIVE mg/dL
Specific Gravity, Urine: 1.008 (ref 1.005–1.030)
pH: 6 (ref 5.0–8.0)

## 2018-09-13 LAB — LACTIC ACID, PLASMA
Lactic Acid, Venous: 1 mmol/L (ref 0.5–1.9)
Lactic Acid, Venous: 0.9 mmol/L (ref 0.5–1.9)

## 2018-09-13 LAB — COMPREHENSIVE METABOLIC PANEL
ALBUMIN: 2.1 g/dL — AB (ref 3.5–5.0)
ALT: 8 U/L (ref 0–44)
ANION GAP: 10 (ref 5–15)
AST: 13 U/L — AB (ref 15–41)
Alkaline Phosphatase: 65 U/L (ref 38–126)
BUN: 10 mg/dL (ref 6–20)
CHLORIDE: 103 mmol/L (ref 98–111)
CO2: 24 mmol/L (ref 22–32)
Calcium: 8.1 mg/dL — ABNORMAL LOW (ref 8.9–10.3)
Creatinine, Ser: 0.68 mg/dL (ref 0.61–1.24)
GFR calc Af Amer: >60 mL/min (ref >60)
GLUCOSE: 114 mg/dL — AB (ref 70–99)
POTASSIUM: 3.8 mmol/L (ref 3.5–5.1)
Sodium: 137 mmol/L (ref 135–145)
Total Bilirubin: 0.5 mg/dL (ref 0.3–1.2)
Total Protein: 6.2 g/dL — ABNORMAL LOW (ref 6.5–8.1)

## 2018-09-13 LAB — CBC
HEMATOCRIT: 24.8 % — AB (ref 39.0–52.0)
HEMOGLOBIN: 8 g/dL — AB (ref 13.0–17.0)
MCH: 28.3 pg (ref 26.0–34.0)
MCHC: 32.3 g/dL (ref 30.0–36.0)
MCV: 87.6 fL (ref 78.0–100.0)
Platelets: 138 10*3/uL — ABNORMAL LOW (ref 150–400)
RBC: 2.83 MIL/uL — ABNORMAL LOW (ref 4.22–5.81)
RDW: 15.5 % (ref 11.5–15.5)
WBC: 3 10*3/uL — AB (ref 4.0–10.5)

## 2018-09-13 LAB — HIV ANTIBODY (ROUTINE TESTING W REFLEX): HIV Screen 4th Generation wRfx: NONREACTIVE

## 2018-09-13 LAB — PROCALCITONIN: PROCALCITONIN: 30.32 ng/mL

## 2018-09-13 LAB — APTT: aPTT: 39 seconds — ABNORMAL HIGH (ref 24–36)

## 2018-09-13 LAB — PHOSPHORUS: PHOSPHORUS: 3.3 mg/dL (ref 2.5–4.6)

## 2018-09-13 LAB — PREALBUMIN: PREALBUMIN: 5.3 mg/dL — AB (ref 18–38)

## 2018-09-13 LAB — MAGNESIUM: MAGNESIUM: 1.6 mg/dL — AB (ref 1.7–2.4)

## 2018-09-13 LAB — TSH: TSH: 0.676 u[IU]/mL (ref 0.350–4.500)

## 2018-09-13 LAB — MRSA PCR SCREENING: MRSA BY PCR: NEGATIVE

## 2018-09-13 MED ORDER — ACETAMINOPHEN 325 MG PO TABS
650.0000 mg | ORAL_TABLET | Freq: Four times a day (QID) | ORAL | Status: DC | PRN
  Administered 2018-09-13 – 2018-09-16 (×3): 650 mg via ORAL
  Filled 2018-09-13 (×3): qty 2

## 2018-09-13 MED ORDER — SODIUM CHLORIDE 0.9 % IV BOLUS
500.0000 mL | Freq: Once | INTRAVENOUS | Status: AC
  Administered 2018-09-14: 500 mL via INTRAVENOUS

## 2018-09-13 MED ORDER — RIVAROXABAN 15 MG PO TABS
15.0000 mg | ORAL_TABLET | Freq: Every day | ORAL | Status: DC
  Administered 2018-09-13 – 2018-09-16 (×4): 15 mg via ORAL
  Filled 2018-09-13 (×4): qty 1

## 2018-09-13 MED ORDER — ONDANSETRON HCL 4 MG PO TABS: 4.0000 mg | ORAL_TABLET | Freq: Four times a day (QID) | ORAL | Status: DC | PRN

## 2018-09-13 MED ORDER — IBUPROFEN 200 MG PO TABS
400.0000 mg | ORAL_TABLET | Freq: Once | ORAL | Status: AC
  Administered 2018-09-13: 400 mg via ORAL
  Filled 2018-09-13: qty 2

## 2018-09-13 MED ORDER — OXYCODONE HCL 5 MG PO TABS
10.0000 mg | ORAL_TABLET | Freq: Four times a day (QID) | ORAL | Status: DC | PRN
  Administered 2018-09-13 – 2018-09-15 (×3): 10 mg via ORAL
  Filled 2018-09-13 (×3): qty 2

## 2018-09-13 MED ORDER — ENSURE ENLIVE PO LIQD
237.0000 mL | Freq: Three times a day (TID) | ORAL | Status: DC
  Administered 2018-09-13 – 2018-09-15 (×7): 237 mL via ORAL

## 2018-09-13 MED ORDER — MORPHINE SULFATE ER 15 MG PO TBCR
15.0000 mg | EXTENDED_RELEASE_TABLET | Freq: Two times a day (BID) | ORAL | Status: DC
  Administered 2018-09-13 – 2018-09-16 (×8): 15 mg via ORAL
  Filled 2018-09-13 (×8): qty 1

## 2018-09-13 MED ORDER — SODIUM CHLORIDE 0.9 % IV BOLUS
500.0000 mL | Freq: Once | INTRAVENOUS | Status: AC
  Administered 2018-09-13: 500 mL via INTRAVENOUS

## 2018-09-13 MED ORDER — SODIUM CHLORIDE 0.9 % IV SOLN
INTRAVENOUS | Status: AC
  Administered 2018-09-13: 12:00:00 via INTRAVENOUS

## 2018-09-13 MED ORDER — ACETAMINOPHEN 325 MG PO TABS
325.0000 mg | ORAL_TABLET | Freq: Once | ORAL | Status: AC
  Administered 2018-09-14: 325 mg via ORAL
  Filled 2018-09-13: qty 1

## 2018-09-13 MED ORDER — MIRTAZAPINE 15 MG PO TABS
15.0000 mg | ORAL_TABLET | Freq: Every day | ORAL | Status: DC
  Administered 2018-09-13 – 2018-09-15 (×4): 15 mg via ORAL
  Filled 2018-09-13 (×4): qty 1

## 2018-09-13 MED ORDER — SODIUM CHLORIDE 0.9 % IV SOLN
INTRAVENOUS | Status: AC
  Administered 2018-09-13: 02:00:00 via INTRAVENOUS

## 2018-09-13 MED ORDER — CHLORPROMAZINE HCL 10 MG PO TABS
10.0000 mg | ORAL_TABLET | Freq: Three times a day (TID) | ORAL | Status: DC | PRN
  Filled 2018-09-13: qty 1

## 2018-09-13 MED ORDER — MAGNESIUM SULFATE 2 GM/50ML IV SOLN
2.0000 g | Freq: Once | INTRAVENOUS | Status: AC
  Administered 2018-09-13: 2 g via INTRAVENOUS
  Filled 2018-09-13: qty 50

## 2018-09-13 MED ORDER — NYSTATIN 100000 UNIT/ML MT SUSP
5.0000 mL | Freq: Four times a day (QID) | OROMUCOSAL | Status: DC
  Administered 2018-09-13 – 2018-09-15 (×9): 500000 [IU] via ORAL
  Filled 2018-09-13 (×10): qty 5

## 2018-09-13 MED ORDER — SODIUM CHLORIDE 0.9 % IV SOLN
2.0000 g | Freq: Three times a day (TID) | INTRAVENOUS | Status: DC
  Administered 2018-09-13 – 2018-09-16 (×10): 2 g via INTRAVENOUS
  Filled 2018-09-13 (×12): qty 2

## 2018-09-13 MED ORDER — IOHEXOL 300 MG/ML  SOLN: 25.0000 mL | Freq: Once | INTRAMUSCULAR | Status: DC | PRN

## 2018-09-13 MED ORDER — ONDANSETRON HCL 4 MG/2ML IJ SOLN: 4.0000 mg | Freq: Four times a day (QID) | INTRAMUSCULAR | Status: DC | PRN

## 2018-09-13 MED ORDER — IOPAMIDOL (ISOVUE-300) INJECTION 61%
100.0000 mL | Freq: Once | INTRAVENOUS | Status: AC | PRN
  Administered 2018-09-13: 100 mL via INTRAVENOUS

## 2018-09-13 MED ORDER — ACETAMINOPHEN 650 MG RE SUPP: 650.0000 mg | Freq: Four times a day (QID) | RECTAL | Status: DC | PRN

## 2018-09-13 MED ORDER — ORAL CARE MOUTH RINSE
15.0000 mL | Freq: Two times a day (BID) | OROMUCOSAL | Status: DC
  Administered 2018-09-13 – 2018-09-15 (×6): 15 mL via OROMUCOSAL

## 2018-09-13 MED ORDER — VANCOMYCIN HCL IN DEXTROSE 750-5 MG/150ML-% IV SOLN
750.0000 mg | Freq: Two times a day (BID) | INTRAVENOUS | Status: DC
  Administered 2018-09-13 – 2018-09-16 (×7): 750 mg via INTRAVENOUS
  Filled 2018-09-13 (×8): qty 150

## 2018-09-13 MED ORDER — TRAMADOL HCL 50 MG PO TABS
50.0000 mg | ORAL_TABLET | Freq: Four times a day (QID) | ORAL | Status: DC | PRN
  Administered 2018-09-15: 50 mg via ORAL
  Filled 2018-09-13: qty 1

## 2018-09-13 NOTE — Progress Notes (Signed)
Pharmacy Antibiotic Note  Jeff [name] is a 57 y.o. male admitted on 09/12/2018 with sepsis.  Pharmacy has been consulted for Vancomycin, cefepime dosing.  Plan: Cefepime 2gm iv q8hr  Vancomycin 1gm iv x1, then 750mg  iv q12hr  Goal AUC = 400 - 500 for all indications, except meningitis (goal AUC > 500 and Cmin 15-20 mcg/mL)   Height: 5\' 9"  (175.3 cm) Weight: 121 lb 7.6 oz (55.1 kg) IBW/kg (Calculated) : 70.7  Temp (24hrs), Avg:100.5 F (38.1 C), Min:99.1 F (37.3 C), Max:103 F (39.4 C)  Recent Labs  Lab 09/08/18 0751 09/12/18 2047 09/12/18 2051 09/12/18 2328 09/13/18 0239  WBC 3.6* 1.6*  --   --  3.0*  CREATININE 0.74 0.89  --   --  0.68  LATICACIDVEN  --   --  2.81* 1.0 0.9    Estimated Creatinine Clearance: 77.5 mL/min (by C-G formula based on SCr of 0.68 mg/dL).    No Known Allergies  Antimicrobials this admission: Vancomycin 09/12/2018 >> Cefepime 09/12/2018 >>   Dose adjustments this admission:   Microbiology results:   Thank you for allowing pharmacy to be a part of this patient's care.  [name] 09/13/2018 5:01 AM

## 2018-09-13 NOTE — Progress Notes (Signed)
Initial Nutrition Assessment  DOCUMENTATION CODES:   Underweight, Severe malnutrition in context of chronic illness  INTERVENTION:   Ensure Enlive po TID, each supplement provides 350 kcal and 20 grams of protein  NUTRITION DIAGNOSIS:   Severe Malnutrition related to chronic illness, cancer and cancer related treatments as evidenced by energy intake < or equal to 75% for > or equal to 1 month, moderate fat depletion, severe muscle depletion, percent weight loss.  GOAL:   Patient will meet greater than or equal to 90% of their needs  MONITOR:   PO intake, Supplement acceptance, Labs, Weight trends, I & O's  REASON FOR ASSESSMENT:   Consult Assessment of nutrition requirement/status  ASSESSMENT:   Patient with PMH significant for stage IV colon cancer s/p colostomy on chemotherapy, DVT status post IVC filter, anemia, and pancreatic abscess. Presents this admission with sepsis from unclear source.    Pt currently followed by outpatient [facility] (last visit on 9/12). They discussed ways to increase calories and protein to minimize weight loss. Pt was offered a case of Ensure Plus and coupons. Pt endorses having a loss in appetite over last several months due to taste changes from chemotherapy. He describes the food as having "no taste." Intake is off and on but when he can tolerate food he takes a couple bites of hotdogs, hamburgers, and chicken. Pt tries to increase his Ensure consumption to 3 times/day if he knows he hasn't ate enough. He denies any issues with swallow, nausea, or vomiting. He has a history of chronic diarrhea likely related to chemotherapy. This RD reiterated what [facility] RD discussed last visit. Pt's diet just advanced to heart healthy this afternoon. RD to provide supplements.    Pt endorses a UBW of 135 lb and a recent wt loss of 20 lb. Records indicate pt weighed 134 lb on 04/04/18 and 121 lb this admission (9.7% wt loss in 5 months, significant for  time frame). Nutrition-Focused physical exam completed.   Medications reviewed and include: Remeron once daily Labs reviewed: Mg 1.6 (L)  NUTRITION - FOCUSED PHYSICAL EXAM:    Most Recent Value  Orbital Region  Moderate depletion  Upper Arm Region  Moderate depletion  Thoracic and Lumbar Region  Unable to assess  Buccal Region  Moderate depletion  Temple Region  Moderate depletion  Clavicle Bone Region  Severe depletion  Clavicle and Acromion Bone Region  Severe depletion  Scapular Bone Region  Unable to assess  Dorsal Hand  Mild depletion  Patellar Region  Severe depletion  Anterior Thigh Region  Severe depletion  Posterior Calf Region  Moderate depletion  Edema (RD Assessment)  None     Diet Order:   Diet Order            Diet Heart Room service appropriate? Yes; Fluid consistency: Thin  Diet effective now              EDUCATION NEEDS:   Education needs have been addressed  Skin:  Skin Assessment: Reviewed RN Assessment  Last BM:  9/17- 100 ml colostomy  Height:   Ht Readings from Last 1 Encounters:  09/13/18 5\' 9"  (1.753 m)    Weight:   Wt Readings from Last 1 Encounters:  09/13/18 55.1 kg    Ideal Body Weight:  72.7 kg  BMI:  Body mass index is 17.94 kg/m.  Estimated Nutritional Needs:   Kcal:  1800-2000 kcal  Protein:  90-105 grams  Fluid:  >/= 1.8 L/day  Mariana Single RD, LDN Clinical Nutrition Pager # [phone]

## 2018-09-13 NOTE — Progress Notes (Signed)
Patient ID: Jeff [name], male   [name]: January 17, 1961, 57 y.o.   MRN: [id number]  PROGRESS NOTE    Jeff [name]  VPX:[id number] [name]: 1961/05/30 [name]: 09/12/2018 PCP: [name], MD   Brief Narrative:  57 year old male with history of stage IV colon cancer status post colostomy on chemotherapy, DVT status post IVC filter, anemia, pancreatic abscess presented on 09/12/2018 with fever.  Patient was started on broad-spectrum antibiotics.  CT of the abdomen and pelvis did not show any obvious source of fever.  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 -Respiratory panel PCR negative -CT of the abdomen and pelvis was negative for any obvious source of infection -Follow cultures -If the patient continues to spike temperatures, might have to get CT of the chest -Repeat a.m. labs -She has normal saline to 75 cc an hour  Metastatic colon cancer with history of colostomy -Currently on chemotherapy.  [name] notified via epic  History of DVT -Continue Xarelto  Anemia of chronic disease secondary to cancer -Stable.  Monitor  Pancytopenia -Probably secondary to chemotherapy.  Monitor.  No signs of bleeding  Malnutrition of moderate degree -Nutrition consult  Hypomagnesemia-replace.  Repeat a.m. labs  DVT prophylaxis: Xarelto Code Status: Full Family Communication: None at bedside Disposition Plan: Home in 1 to 2 days  Consultants: Notified oncology/[name] via epic  Procedures: None  Antimicrobials: Cefepime, vancomycin and Flagyl from 09/12/2018 onwards   Subjective: Patient seen and examined at bedside.  He feels slightly better.   Denies current nausea, vomiting, abdominal pain.  Objective: Vitals:   09/13/18 0008 09/13/18 0034 09/13/18 0040 09/13/18 0640  BP:  97/64  104/74  Pulse:  87  77  Resp:  17  17  Temp: 99.1 F (37.3 C) 99.3 F (37.4 C)  98.4 F (36.9 C)  TempSrc: Oral Oral  Oral  SpO2:  100%  99%  Weight:   55.1 kg   Height:   5\' 9"  (1.753 m)     Intake/Output Summary (Last 24 hours) at 09/13/2018 1141 Last data filed at 09/13/2018 1056 Gross per 24 hour  Intake 2830.01 ml  Output 2500 ml  Net 330.01 ml   Filed Weights   09/12/18 2048 09/13/18 0040  Weight: 54 kg 55.1 kg    Examination:  General exam: Appears calm and comfortable  Respiratory system: Bilateral decreased breath sounds at bases Cardiovascular system: S1 & S2 heard, Rate controlled Gastrointestinal system: Abdomen is nondistended, soft and nontender.  Colostomy in place.  Normal bowel sounds heard. Extremities: No cyanosis, clubbing, edema  Central nervous system: Alert and oriented. No focal neurological deficits. Moving extremities Skin: No rashes, lesions or ulcers Psychiatry: Flat affect    Data Reviewed: I have personally reviewed following labs and imaging studies  CBC: Recent Labs  Lab 09/08/18 0751 09/12/18 2047 09/13/18 0239  WBC 3.6* 1.6* 3.0*  NEUTROABS 1.8 1.4*  --   HGB 9.9* 8.7* 8.0*  HCT 30.9* 26.3* 24.8*  MCV 89.8 86.8 87.6  PLT 161 161 462*   Basic Metabolic Panel: Recent Labs  Lab 09/08/18 0751 09/12/18 2047 09/13/18 0239  NA  136 134* 137  K 3.9 3.7 3.8  CL 102 101 103  CO2 24 22 24   GLUCOSE 138* 158* 114*  BUN 7 12 10   CREATININE 0.74 0.89 0.68  CALCIUM 9.5 8.3* 8.1*  MG  --   --  1.6*  PHOS  --   --  3.3   GFR: Estimated Creatinine Clearance: 77.5 mL/min (by C-G formula based on SCr of 0.68 mg/dL). Liver Function Tests: Recent Labs  Lab 09/08/18 0751 09/12/18 2047 09/13/18 0239  AST 10* 21 13*  ALT 6 8 8   ALKPHOS 78 64 65  BILITOT 0.3 0.4 0.5  PROT 8.2* 6.8 6.2*    ALBUMIN 2.5* 2.3* 2.1*   No results for input(s): LIPASE, AMYLASE in the last 168 hours. No results for input(s): AMMONIA in the last 168 hours. Coagulation Profile: Recent Labs  Lab 09/12/18 2047  INR 2.35   Cardiac Enzymes: No results for input(s): CKTOTAL, CKMB, CKMBINDEX, TROPONINI in the last 168 hours. BNP (last 3 results) No results for input(s): PROBNP in the last 8760 hours. HbA1C: No results for input(s): HGBA1C in the last 72 hours. CBG: No results for input(s): GLUCAP in the last 168 hours. Lipid Profile: No results for input(s): CHOL, HDL, LDLCALC, TRIG, CHOLHDL, LDLDIRECT in the last 72 hours. Thyroid Function Tests: Recent Labs    09/13/18 0239  TSH 0.676   Anemia Panel: No results for input(s): VITAMINB12, FOLATE, FERRITIN, TIBC, IRON, RETICCTPCT in the last 72 hours. Sepsis Labs: Recent Labs  Lab 09/12/18 2051 09/12/18 2328 09/13/18 0239  PROCALCITON  --  30.32  --   LATICACIDVEN 2.81* 1.0 0.9    Recent Results (from the past 240 hour(s))  Culture, blood (Routine x 2)     Status: None (Preliminary result)   Collection Time: 09/12/18 10:40 PM  Result Value Ref Range Status   Specimen Description BLOOD RIGHT FOREARM  Final   Special Requests   Final    BOTTLES DRAWN AEROBIC AND ANAEROBIC Blood Culture results may not be optimal due to an inadequate volume of blood received in culture bottles Performed at [facility] [address] [place], [place] [address]    Culture PENDING  Incomplete   Report Status PENDING  Incomplete  Respiratory Panel by PCR     Status: None   Collection Time: 09/13/18  1:44 AM  Result Value Ref Range Status   Adenovirus NOT DETECTED NOT DETECTED Final   Coronavirus 229E NOT DETECTED NOT DETECTED Final   Coronavirus HKU1 NOT DETECTED NOT DETECTED Final   Coronavirus NL63 NOT DETECTED NOT DETECTED Final   Coronavirus OC43 NOT DETECTED NOT DETECTED Final   Metapneumovirus NOT DETECTED NOT DETECTED Final    Rhinovirus / Enterovirus NOT DETECTED NOT DETECTED Final   Influenza A NOT DETECTED NOT DETECTED Final   Influenza B NOT DETECTED NOT DETECTED Final   Parainfluenza Virus 1 NOT DETECTED NOT DETECTED Final   Parainfluenza Virus 2 NOT DETECTED NOT DETECTED Final   Parainfluenza Virus 3 NOT DETECTED NOT DETECTED Final   Parainfluenza Virus 4 NOT DETECTED NOT DETECTED Final   Respiratory Syncytial Virus NOT DETECTED NOT DETECTED Final   Bordetella pertussis NOT DETECTED NOT DETECTED Final   Chlamydophila pneumoniae NOT DETECTED NOT DETECTED Final   Mycoplasma pneumoniae NOT DETECTED NOT DETECTED Final    Comment: Performed at [facility], [place] [address], [place], [place] [address]  MRSA PCR Screening     Status: None   Collection Time:  09/13/18  1:44 AM  Result Value Ref Range Status   MRSA by PCR NEGATIVE NEGATIVE Final    Comment:        The GeneXpert MRSA Assay (FDA approved for NASAL specimens only), is one component of a comprehensive MRSA colonization surveillance program. It is not intended to diagnose MRSA infection nor to guide or monitor treatment for MRSA infections. Performed at [facility], [place] [address], [place], [place] [address]          Radiology Studies: Ct Abdomen Pelvis W Contrast  Result Date: 09/13/2018 CLINICAL DATA:  Abdominal pain, known stomach cancer. Stage IV colon cancer metastatic to the spleen. EXAM: CT ABDOMEN AND PELVIS WITH CONTRAST TECHNIQUE: Multidetector CT imaging of the abdomen and pelvis was performed using the standard protocol following bolus administration of intravenous contrast. CONTRAST:  131mL ISOVUE-300 IOPAMIDOL (ISOVUE-300) INJECTION 61% COMPARISON:  CT 03/31/2018 and 07/07/2018 FINDINGS: Lower chest: The included heart size is normal without pericardial effusion or thickening. New small left and trace right pleural effusions are identified since the most recent comparison. No confluent airspace disease.  Subsegmental atelectasis is seen at lung bases. No suspicious pulmonary masses at the lung bases. Hepatobiliary: Subcapsular hypervascular blush measuring 8 mm, series 2/15 is noted in the region right hepatic lobe, slightly more prominent than on prior exam. Hypervascular metastasis is not excluded. Gallbladder is unremarkable and free of stones. Pancreas: No ductal dilatation or enhancing mass. Hypodense appearance in the region of the pancreatic tail appears to be extrinsic to the pancreas on coronal reformats and contiguous with known cystic mass in the left upper quadrant at the level of the splenic hilum. Spleen: Interval increase in hypodense mass at the splenic hilum with local invasion of the splenic parenchyma, now measuring 6.8 x 6.6 cm, series 2/20 versus 7.2 x 4.7 cm, series 2/65 previously. More linear hypodensities are seen within the main year of the splenic parenchyma, nonspecific but could represent further invasion of the spleen. Adrenals/Urinary Tract: Normal bilateral adrenal glands. Symmetric cortical enhancement of both kidneys. No renal mass nor obstructive uropathy. Stomach/Bowel: Small to moderate-sized hiatal hernia. Fluid and air-filled stomach without focal mural thickening. Mild transmural thickening of the third and fourth portions of the duodenum some which could be due to duodenitis and/or underdistention. Redemonstration of right anterior abdominal wall colostomy. Thickened masslike abnormality of the proximal descending colon is again noted without significant interval change measuring at least 5.3 x 4.5 x 7.2 cm, series 2/32 and coronal image 49. Vascular/Lymphatic: Stent in the left iliac vein. Normal caliber atherosclerotic abdominal aorta. Patent branch vessels. The cystic mass at the splenic hilum appears to attenuate a branch off the splenic artery, series 2/20. Smaller left para-aortic lymph node measuring 7 mm versus 10 mm previously. Reproductive: Enlarged prostate.   Normal seminal vesicles. Other: Previously suspected peritoneal implant in the left upper quadrant is more difficult to separate from the adjacent colonic lesion and splenic metastasis. It appears to measure slightly less though than on prior exam, estimated at 2.8 x 1.5 cm versus 4.2 x 2.7 cm previously. Musculoskeletal: No aggressive osseous lesions. Degenerative disc disease L5-S1. Bridging osteophyte of the left SI joint. IMPRESSION: 1. Trace right and small left pleural effusions, new since prior. 2. Faint 8 mm hypervascular blush in the right hepatic lobe, nonspecific but given history of colon cancer, cannot exclude a hypervascular metastatic focus. 3. Interval increase in hypodense mass at the splenic hilum insinuating into the splenic parenchyma. 4. Mild thickening  of the duodenum query duodenitis. 5. Similar thickened abnormal appearance of the proximal descending colon as before. 6. Slightly smaller appearing peritoneal implants in the left upper quadrant. Electronically Signed   By: Ashley Royalty M.D.   On: 09/13/2018 03:25   Dg Chest Port 1 View  Result Date: 09/12/2018 CLINICAL DATA:  Weakness and fever EXAM: PORTABLE CHEST 1 VIEW COMPARISON:  CT 07/07/2018, radiograph 01/08/2018 FINDINGS: Right-sided central venous port tip over the SVC. No focal opacity or pleural effusion. Normal cardiomediastinal silhouette. No pneumothorax. IMPRESSION: No active disease. Electronically Signed   By: Donavan Foil M.D.   On: 09/12/2018 21:26        Scheduled Meds: . mouth rinse  15 mL Mouth Rinse BID  . mirtazapine  15 mg Oral QHS  . morphine  15 mg Oral Q12H  . nystatin  5 mL Oral Q6H  . Rivaroxaban  15 mg Oral Q lunch   Continuous Infusions: . ceFEPime (MAXIPIME) IV 2 g (09/13/18 4827)  . magnesium sulfate 1 - 4 g bolus IVPB 2 g (09/13/18 1045)  . metronidazole 500 mg (09/13/18 0659)  . vancomycin 750 mg (09/13/18 0945)     LOS: 1 day        Aline August, MD [facility] Pager [phone]  If 7PM-7AM, please contact night-coverage [email or web address] Password [email or web address] 09/13/2018, 11:41 AM

## 2018-09-13 NOTE — ED Notes (Signed)
ED TO INPATIENT HANDOFF REPORT  Name/Age/Gender Jeff [name] 57 y.o. male  Code Status Code Status History    Date Active Date Inactive Code Status Order ID Comments User Context   12/31/2017 1227 01/11/2018 1446 Full Code [id number]  Theodis Blaze, MD Inpatient   08/19/2017 1755 08/21/2017 2326 Full Code [id number]  Vashti Hey, MD Inpatient   07/29/2017 1738 08/17/2017 1844 Full Code [id number]  Vianne Bulls, MD ED   07/10/2017 0041 07/14/2017 1635 Full Code [id number]  Ivor Costa, MD ED   01/11/2017 0150 01/19/2017 2008 Full Code [id number]  Etta Quill, DO ED      Home/SNF/Other Home  Chief Complaint Cancer Patient/Fever and Weakness  Level of Care/Admitting Diagnosis ED Disposition    ED Disposition [facility] Area: [facility] [[id number]]  Level of Care: Telemetry [5]  Admit to tele based on following criteria: Other see comments  Comments: tachycardia  Diagnosis: Sepsis [facility]) [[id number]]  Admitting Physician: Toy Baker [[id number]]  Attending Physician: Toy Baker [[id number]]  Estimated length of stay: 3 - 4 days  Certification:: I certify this patient will need inpatient services for at least 2 midnights  PT Class (Do Not Modify): Inpatient [101]  PT Acc Code (Do Not Modify): Private [1]       Medical History Past Medical History:  Diagnosis Date  . Acute deep vein thrombosis (DVT) of right lower extremity ([place]) 02/18/2017  . Malignant neoplasm of abdomen ([place])    [name] 01/11/2017  . Microcytic anemia    /notes 01/11/2017    Allergies No Known Allergies  IV Location/Drains/Wounds Patient Lines/Drains/Airways Status   Active Line/Drains/Airways    Name:   Placement date:   Placement time:   Site:   Days:   Implanted Port 01/29/17 Right Chest   01/29/17    -    Chest   592   Peripheral IV 01/06/18 Right;Lateral Forearm   01/06/18    1130    Forearm   250   Peripheral IV 03/31/18 Left Antecubital    03/31/18    0931    Antecubital   166   Peripheral IV 09/12/18 Left Forearm   09/12/18    2128    Forearm   1   Peripheral IV 09/12/18 Left Hand   09/12/18    2137    Hand   1   Colostomy RUQ   08/04/17    1312    RUQ   405          Labs/Imaging Results for orders placed or performed during the hospital encounter of 09/12/18 (from the past 48 hour(s))  Comprehensive metabolic panel     Status: Abnormal   Collection Time: 09/12/18  8:47 PM  Result Value Ref Range   Sodium 134 (L) 135 - 145 mmol/L   Potassium 3.7 3.5 - 5.1 mmol/L   Chloride 101 98 - 111 mmol/L   CO2 22 22 - 32 mmol/L   Glucose, Bld 158 (H) 70 - 99 mg/dL   BUN 12 6 - 20 mg/dL   Creatinine, Ser 0.89 0.61 - 1.24 mg/dL   Calcium 8.3 (L) 8.9 - 10.3 mg/dL   Total Protein 6.8 6.5 - 8.1 g/dL   Albumin 2.3 (L) 3.5 - 5.0 g/dL   AST 21 15 - 41 U/L   ALT 8 0 - 44 U/L   Alkaline Phosphatase 64 38 - 126 U/L   Total Bilirubin 0.4 0.3 -  1.2 mg/dL   GFR calc non Af Amer >60 >60 mL/min   GFR calc Af Amer >60 >60 mL/min    Comment: (NOTE) The eGFR has been calculated using the CKD EPI equation. This calculation has not been validated in all clinical situations. eGFR's persistently <60 mL/min signify possible Chronic Kidney Disease.    Anion gap 11 5 - 15    Comment: Performed at [facility], [place] [address], [place], [place] [address]  CBC with Differential     Status: Abnormal   Collection Time: 09/12/18  8:47 PM  Result Value Ref Range   WBC 1.6 (L) 4.0 - 10.5 K/uL   RBC 3.03 (L) 4.22 - 5.81 MIL/uL   Hemoglobin 8.7 (L) 13.0 - 17.0 g/dL   HCT 26.3 (L) 39.0 - 52.0 %   MCV 86.8 78.0 - 100.0 fL   MCH 28.7 26.0 - 34.0 pg   MCHC 33.1 30.0 - 36.0 g/dL   RDW 15.4 11.5 - 15.5 %   Platelets 161 150 - 400 K/uL   Neutrophils Relative % 86 %   Neutro Abs 1.4 (L) 1.7 - 7.7 K/uL   Lymphocytes Relative 11 %   Lymphs Abs 0.2 (L) 0.7 - 4.0 K/uL   Monocytes Relative 1 %   Monocytes Absolute 0.0 (L) 0.1 - 1.0 K/uL    Eosinophils Relative 1 %   Eosinophils Absolute 0.0 0.0 - 0.7 K/uL   Basophils Relative 1 %   Basophils Absolute 0.0 0.0 - 0.1 K/uL    Comment: Performed at [facility], [place] [address] [place], [place] [address]  Protime-INR     Status: Abnormal   Collection Time: 09/12/18  8:47 PM  Result Value Ref Range   Prothrombin Time 25.5 (H) 11.4 - 15.2 seconds   INR 2.35     Comment: Performed at [facility], [place] [address] [place], [place] [address]  I-Stat CG4 Lactic Acid, ED     Status: Abnormal   Collection Time: 09/12/18  8:51 PM  Result Value Ref Range   Lactic Acid, Venous 2.81 (HH) 0.5 - 1.9 mmol/L  APTT     Status: Abnormal   Collection Time: 09/12/18 11:28 PM  Result Value Ref Range   aPTT 39 (H) 24 - 36 seconds    Comment:        IF BASELINE aPTT IS ELEVATED, SUGGEST PATIENT RISK ASSESSMENT BE USED TO DETERMINE APPROPRIATE ANTICOAGULANT THERAPY. Performed at [facility], [place] [address], [place],  [address]    Dg Chest Port 1 View  Result Date: 09/12/2018 CLINICAL DATA:  Weakness and fever EXAM: PORTABLE CHEST 1 VIEW COMPARISON:  CT 07/07/2018, radiograph 01/08/2018 FINDINGS: Right-sided central venous port tip over the SVC. No focal opacity or pleural effusion. Normal cardiomediastinal silhouette. No pneumothorax. IMPRESSION: No active disease. Electronically Signed   By: Donavan Foil M.D.   On: 09/12/2018 21:26    Pending Labs Unresulted Labs (From admission, onward)    Start     Ordered   09/12/18 2316  Lactic acid, plasma  STAT,   R     09/12/18 2318   09/12/18 2316  Lactic acid, plasma  STAT Now then every 3 hours,   STAT     09/12/18 2318   09/12/18 2316  Procalcitonin  STAT,   R     09/12/18 2318   09/12/18 2103  Urine culture  STAT,   STAT     09/12/18 2102  09/12/18 2045  Culture, blood (Routine x 2)  BLOOD CULTURE X 2,   STAT     09/12/18 2045   09/12/18 2045  Urinalysis, Routine w  reflex microscopic  STAT,   STAT     09/12/18 2045   Signed and Held  HIV antibody (Routine Testing)  Tomorrow morning,   R     Signed and Held   Signed and Held  Magnesium  Tomorrow morning,   R    Comments:  Call MD if <1.5    Signed and Held   Signed and Held  Phosphorus  Tomorrow morning,   R     Signed and Held   Signed and Held  TSH  Once,   R    Comments:  Cancel if already done within 1 month and notify MD    Signed and Held   Signed and Held  Comprehensive metabolic panel  Once,   R    Comments:  Cal MD for K<3.5 or >5.0    Signed and Held   Signed and Held  CBC  Once,   R    Comments:  Call for hg <8.0    Signed and Held          Vitals/Pain Today's Vitals   09/12/18 2300 09/12/18 2315 09/12/18 2330 09/13/18 0000  BP: 102/71 95/65 97/67  99/65  Pulse: (!) 101 97 93 88  Resp: 17 12 15 19   Temp:      TempSrc:      SpO2: 98% 99% 100% 99%  Weight:      Height:        Isolation Precautions No active isolations  Medications Medications  metroNIDAZOLE (FLAGYL) IVPB 500 mg (500 mg Intravenous New Bag/Given 09/12/18 2241)  vancomycin (VANCOCIN) IVPB 1000 mg/200 mL premix (1,000 mg Intravenous New Bag/Given 09/12/18 2341)  sodium chloride 0.9 % bolus 1,000 mL (1,000 mLs Intravenous New Bag/Given 09/12/18 2347)  lactated ringers bolus 1,000 mL (0 mLs Intravenous Stopped 09/12/18 2342)    And  lactated ringers bolus 500 mL (0 mLs Intravenous Stopped 09/12/18 2342)    And  lactated ringers bolus 250 mL (0 mLs Intravenous Stopped 09/12/18 2342)  ceFEPIme (MAXIPIME) 2 g in sodium chloride 0.9 % 100 mL IVPB (0 g Intravenous Stopped 09/12/18 2239)    Mobility walks

## 2018-09-14 ENCOUNTER — Inpatient Hospital Stay (HOSPITAL_COMMUNITY): Payer: Medicaid Other

## 2018-09-14 DIAGNOSIS — Z86718 Personal history of other venous thrombosis and embolism: Secondary | ICD-10-CM

## 2018-09-14 DIAGNOSIS — Z7901 Long term (current) use of anticoagulants: Secondary | ICD-10-CM

## 2018-09-14 DIAGNOSIS — D630 Anemia in neoplastic disease: Secondary | ICD-10-CM

## 2018-09-14 DIAGNOSIS — Z933 Colostomy status: Secondary | ICD-10-CM

## 2018-09-14 DIAGNOSIS — Z95828 Presence of other vascular implants and grafts: Secondary | ICD-10-CM

## 2018-09-14 DIAGNOSIS — R6510 Systemic inflammatory response syndrome (SIRS) of non-infectious origin without acute organ dysfunction: Secondary | ICD-10-CM

## 2018-09-14 DIAGNOSIS — E440 Moderate protein-calorie malnutrition: Secondary | ICD-10-CM

## 2018-09-14 LAB — CBC WITH DIFFERENTIAL/PLATELET
BASOS ABS: 0 10*3/uL (ref 0.0–0.1)
BASOS PCT: 0 %
Eosinophils Absolute: 0.1 10*3/uL (ref 0.0–0.7)
Eosinophils Relative: 3 %
HEMATOCRIT: 26.6 % — AB (ref 39.0–52.0)
HEMOGLOBIN: 8.7 g/dL — AB (ref 13.0–17.0)
LYMPHS PCT: 24 %
Lymphs Abs: 0.9 10*3/uL (ref 0.7–4.0)
MCH: 28.6 pg (ref 26.0–34.0)
MCHC: 32.7 g/dL (ref 30.0–36.0)
MCV: 87.5 fL (ref 78.0–100.0)
MONO ABS: 0.3 10*3/uL (ref 0.1–1.0)
Monocytes Relative: 8 %
NEUTROS ABS: 2.4 10*3/uL (ref 1.7–7.7)
NEUTROS PCT: 65 %
Platelets: 130 10*3/uL — ABNORMAL LOW (ref 150–400)
RBC: 3.04 MIL/uL — AB (ref 4.22–5.81)
RDW: 15.5 % (ref 11.5–15.5)
WBC: 3.7 10*3/uL — ABNORMAL LOW (ref 4.0–10.5)

## 2018-09-14 LAB — COMPREHENSIVE METABOLIC PANEL
ALK PHOS: 64 U/L (ref 38–126)
ALT: 7 U/L (ref 0–44)
ANION GAP: 7 (ref 5–15)
AST: 11 U/L — AB (ref 15–41)
Albumin: 1.9 g/dL — ABNORMAL LOW (ref 3.5–5.0)
BILIRUBIN TOTAL: 0.6 mg/dL (ref 0.3–1.2)
BUN: 8 mg/dL (ref 6–20)
CHLORIDE: 109 mmol/L (ref 98–111)
CO2: 23 mmol/L (ref 22–32)
Calcium: 8 mg/dL — ABNORMAL LOW (ref 8.9–10.3)
Creatinine, Ser: 0.8 mg/dL (ref 0.61–1.24)
GFR calc Af Amer: >60 mL/min (ref >60)
GFR calc non Af Amer: >60 mL/min (ref >60)
Glucose, Bld: 124 mg/dL — ABNORMAL HIGH (ref 70–99)
POTASSIUM: 3.6 mmol/L (ref 3.5–5.1)
Sodium: 139 mmol/L (ref 135–145)
TOTAL PROTEIN: 6.1 g/dL — AB (ref 6.5–8.1)

## 2018-09-14 LAB — MAGNESIUM: Magnesium: 2 mg/dL (ref 1.7–2.4)

## 2018-09-14 LAB — URINE CULTURE: CULTURE: NO GROWTH

## 2018-09-14 MED ORDER — IOHEXOL 300 MG/ML  SOLN
75.0000 mL | Freq: Once | INTRAMUSCULAR | Status: AC | PRN
  Administered 2018-09-14: 75 mL via INTRAVENOUS

## 2018-09-14 MED ORDER — SODIUM CHLORIDE 0.9 % IJ SOLN
INTRAMUSCULAR | Status: AC
  Administered 2018-09-14: 16:00:00
  Filled 2018-09-14: qty 50

## 2018-09-14 NOTE — Progress Notes (Signed)
PROGRESS NOTE    Jeff [name]  TKW:[id number] [name]: 14-May-1961 [name]: 09/12/2018 PCP: [name], MD   Brief Narrative: Jeff [name] is a 57 y.o. male with history of stage IV colon cancer status post colostomy on chemotherapy, DVT status post IVC filter, anemia, pancreatic abscess. He presented with persistent fevers with concern for sepsis. He was started on empiric antibiotics. No source identified to date. Clinically, is doing better but not stable for discharge.   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 Fever and leukopenia in setting of chemotherapy. Started on empiric antibiotics. Urine culture without growth. Blood culture no growth to date. CT abdomen/pelvis not helpful for diagnosis. Respiratory panel negative. -Continue Vancomycin, Cefepime, Flagyl -Blood cultures -CT head, maxillofacial, chest  Metastatic colon cancer Patient is on chemotherapy. Follows with [name]  History of DVT -Continue Xarelto  Pancytopenia In setting of chemotherapy.  Moderate malnutrition  Hypomagnesemia Resolved with supplementation.   DVT prophylaxis: Xarelto Code Status:   Code Status: Full Code Family Communication: None at bedside Disposition Plan: Discharge likely in 48+ hours pending identification of source and/or improvement of fevers   Consultants:   Oncology  Procedures:   None  Antimicrobials:  Vancomycin  Cefepime  Flagyl    Subjective: Patient feels better today. No symptoms.  Objective: Vitals:   09/13/18 2200 09/13/18 2302 09/14/18 0051 09/14/18 0534  BP: 125/69   98/69  Pulse: 98   69  Resp: 17   16  Temp: (!) 103 F (39.4  C) (!) 103.2 F (39.6 C) 99.3 F (37.4 C) 97.8 F (36.6 C)  TempSrc: Oral Oral Oral Oral  SpO2: 96%   99%  Weight:      Height:        Intake/Output Summary (Last 24 hours) at 09/14/2018 1407 Last data filed at 09/14/2018 1200 Gross per 24 hour  Intake 2810 ml  Output 400 ml  Net 2410 ml   Filed Weights   09/12/18 2048 09/13/18 0040  Weight: 54 kg 55.1 kg    Examination:  General exam: Appears calm and comfortable HEENT: poor dentition with some broken teeth. No erythema, thrush or swelling Respiratory system: Clear to auscultation. Respiratory effort normal. Cardiovascular system: S1 & S2 heard, RRR. No murmurs, rubs, gallops or clicks. Gastrointestinal system: Abdomen is nondistended, soft and nontender. No organomegaly or masses felt. Normal bowel sounds heard. Central nervous system: Alert and oriented to person, place but not time. Does not know who the president is at time of exam. No focal neurological deficits. Extremities: No edema. No calf tenderness Skin: No cyanosis. No rashes Psychiatry: Judgement and insight appear normal. Mood & affect appropriate.     Data Reviewed: I have personally reviewed following labs and imaging studies  CBC: Recent Labs  Lab 09/08/18 0751 09/12/18 2047 09/13/18 0239 09/14/18 0359  WBC 3.6* 1.6* 3.0* 3.7*  NEUTROABS 1.8 1.4*  --  2.4  HGB 9.9* 8.7* 8.0* 8.7*  HCT 30.9* 26.3* 24.8* 26.6*  MCV 89.8 86.8 87.6 87.5  PLT 161 161 138* 924*   Basic Metabolic Panel: Recent Labs  Lab 09/08/18 0751 09/12/18 2047  09/13/18 0239 09/14/18 0359  NA 136 134* 137 139  K 3.9 3.7 3.8 3.6  CL 102 101 103 109  CO2 24 22 24 23   GLUCOSE 138* 158* 114* 124*  BUN 7 12 10 8   CREATININE 0.74 0.89 0.68 0.80  CALCIUM 9.5 8.3* 8.1* 8.0*  MG  --   --  1.6* 2.0  PHOS  --   --  3.3  --    GFR: Estimated Creatinine Clearance: 77.5 mL/min (by C-G formula based on SCr of 0.8 mg/dL). Liver Function Tests: Recent Labs  Lab 09/08/18 0751  09/12/18 2047 09/13/18 0239 09/14/18 0359  AST 10* 21 13* 11*  ALT 6 8 8 7   ALKPHOS 78 64 65 64  BILITOT 0.3 0.4 0.5 0.6  PROT 8.2* 6.8 6.2* 6.1*  ALBUMIN 2.5* 2.3* 2.1* 1.9*   No results for input(s): LIPASE, AMYLASE in the last 168 hours. No results for input(s): AMMONIA in the last 168 hours. Coagulation Profile: Recent Labs  Lab 09/12/18 2047  INR 2.35   Cardiac Enzymes: No results for input(s): CKTOTAL, CKMB, CKMBINDEX, TROPONINI in the last 168 hours. BNP (last 3 results) No results for input(s): PROBNP in the last 8760 hours. HbA1C: No results for input(s): HGBA1C in the last 72 hours. CBG: No results for input(s): GLUCAP in the last 168 hours. Lipid Profile: No results for input(s): CHOL, HDL, LDLCALC, TRIG, CHOLHDL, LDLDIRECT in the last 72 hours. Thyroid Function Tests: Recent Labs    09/13/18 0239  TSH 0.676   Anemia Panel: No results for input(s): VITAMINB12, FOLATE, FERRITIN, TIBC, IRON, RETICCTPCT in the last 72 hours. Sepsis Labs: Recent Labs  Lab 09/12/18 2051 09/12/18 2328 09/13/18 0239  PROCALCITON  --  30.32  --   LATICACIDVEN 2.81* 1.0 0.9    Recent Results (from the past 240 hour(s))  Culture, blood (Routine x 2)     Status: None (Preliminary result)   Collection Time: 09/12/18  9:38 PM  Result Value Ref Range Status   Specimen Description   Final    BLOOD RIGHT HAND Performed at [facility], [place] [address], [place], [place] [address]    Special Requests   Final    BOTTLES DRAWN AEROBIC AND ANAEROBIC Blood Culture adequate volume Performed at [place] [address], [place], [place] [address]    Culture   Final    NO GROWTH 1 DAY Performed at [facility], [place] [address], [place], [place] [address]    Report Status PENDING  Incomplete  Culture, blood (Routine x 2)     Status: None (Preliminary result)   Collection Time: 09/12/18 10:40 PM  Result Value Ref Range Status   Specimen  Description BLOOD RIGHT FOREARM  Final   Special Requests   Final    BOTTLES DRAWN AEROBIC AND ANAEROBIC Blood Culture results may not be optimal due to an inadequate volume of blood received in culture bottles   Culture   Final    NO GROWTH 1 DAY Performed at [facility], [place] [address], [place], [place] [address]    Report Status PENDING  Incomplete  Urine culture     Status: None   Collection Time: 09/12/18 11:27 PM  Result Value Ref Range Status   Specimen Description   Final    URINE, CLEAN CATCH Performed at [facility], [place] [address] [place], [place] [address]    Special Requests   Final  NONE Performed at [facility], [place] [address] [place], [place] [address]    Culture   Final    NO GROWTH Performed at [facility], [place] [address], [place], [place] [address]    Report Status 09/14/2018 FINAL  Final  Respiratory Panel by PCR     Status: None   Collection Time: 09/13/18  1:44 AM  Result Value Ref Range Status   Adenovirus NOT DETECTED NOT DETECTED Final   Coronavirus 229E NOT DETECTED NOT DETECTED Final   Coronavirus HKU1 NOT DETECTED NOT DETECTED Final   Coronavirus NL63 NOT DETECTED NOT DETECTED Final   Coronavirus OC43 NOT DETECTED NOT DETECTED Final   Metapneumovirus NOT DETECTED NOT DETECTED Final   Rhinovirus / Enterovirus NOT DETECTED NOT DETECTED Final   Influenza A NOT DETECTED NOT DETECTED Final   Influenza B NOT DETECTED NOT DETECTED Final   Parainfluenza Virus 1 NOT DETECTED NOT DETECTED Final   Parainfluenza Virus 2 NOT DETECTED NOT DETECTED Final   Parainfluenza Virus 3 NOT DETECTED NOT DETECTED Final   Parainfluenza Virus 4 NOT DETECTED NOT DETECTED Final   Respiratory Syncytial Virus NOT DETECTED NOT DETECTED Final   Bordetella pertussis NOT DETECTED NOT DETECTED Final   Chlamydophila pneumoniae NOT DETECTED NOT DETECTED Final   Mycoplasma pneumoniae NOT DETECTED NOT DETECTED Final    Comment:  Performed at [facility], [place] [address] [place], [place] [address]  MRSA PCR Screening     Status: None   Collection Time: 09/13/18  1:44 AM  Result Value Ref Range Status   MRSA by PCR NEGATIVE NEGATIVE Final    Comment:        The GeneXpert MRSA Assay (FDA approved for NASAL specimens only), is one component of a comprehensive MRSA colonization surveillance program. It is not intended to diagnose MRSA infection nor to guide or monitor treatment for MRSA infections. Performed at [facility], [place] [address] [place], [place] [address]          Radiology Studies: Ct Abdomen Pelvis W Contrast  Result Date: 09/13/2018 CLINICAL DATA:  Abdominal pain, known stomach cancer. Stage IV colon cancer metastatic to the spleen. EXAM: CT ABDOMEN AND PELVIS WITH CONTRAST TECHNIQUE: Multidetector CT imaging of the abdomen and pelvis was performed using the standard protocol following bolus administration of intravenous contrast. CONTRAST:  118mL ISOVUE-300 IOPAMIDOL (ISOVUE-300) INJECTION 61% COMPARISON:  CT 03/31/2018 and 07/07/2018 FINDINGS: Lower chest: The included heart size is normal without pericardial effusion or thickening. New small left and trace right pleural effusions are identified since the most recent comparison. No confluent airspace disease. Subsegmental atelectasis is seen at lung bases. No suspicious pulmonary masses at the lung bases. Hepatobiliary: Subcapsular hypervascular blush measuring 8 mm, series 2/15 is noted in the region right hepatic lobe, slightly more prominent than on prior exam. Hypervascular metastasis is not excluded. Gallbladder is unremarkable and free of stones. Pancreas: No ductal dilatation or enhancing mass. Hypodense appearance in the region of the pancreatic tail appears to be extrinsic to the pancreas on coronal reformats and contiguous with known cystic mass in the left upper quadrant at the level of the splenic hilum. Spleen:  Interval increase in hypodense mass at the splenic hilum with local invasion of the splenic parenchyma, now measuring 6.8 x 6.6 cm, series 2/20 versus 7.2 x 4.7 cm, series 2/65 previously. More linear hypodensities are seen within the main year of the splenic parenchyma, nonspecific but could represent further invasion  of the spleen. Adrenals/Urinary Tract: Normal bilateral adrenal glands. Symmetric cortical enhancement of both kidneys. No renal mass nor obstructive uropathy. Stomach/Bowel: Small to moderate-sized hiatal hernia. Fluid and air-filled stomach without focal mural thickening. Mild transmural thickening of the third and fourth portions of the duodenum some which could be due to duodenitis and/or underdistention. Redemonstration of right anterior abdominal wall colostomy. Thickened masslike abnormality of the proximal descending colon is again noted without significant interval change measuring at least 5.3 x 4.5 x 7.2 cm, series 2/32 and coronal image 49. Vascular/Lymphatic: Stent in the left iliac vein. Normal caliber atherosclerotic abdominal aorta. Patent branch vessels. The cystic mass at the splenic hilum appears to attenuate a branch off the splenic artery, series 2/20. Smaller left para-aortic lymph node measuring 7 mm versus 10 mm previously. Reproductive: Enlarged prostate.  Normal seminal vesicles. Other: Previously suspected peritoneal implant in the left upper quadrant is more difficult to separate from the adjacent colonic lesion and splenic metastasis. It appears to measure slightly less though than on prior exam, estimated at 2.8 x 1.5 cm versus 4.2 x 2.7 cm previously. Musculoskeletal: No aggressive osseous lesions. Degenerative disc disease L5-S1. Bridging osteophyte of the left SI joint. IMPRESSION: 1. Trace right and small left pleural effusions, new since prior. 2. Faint 8 mm hypervascular blush in the right hepatic lobe, nonspecific but given history of colon cancer, cannot exclude a  hypervascular metastatic focus. 3. Interval increase in hypodense mass at the splenic hilum insinuating into the splenic parenchyma. 4. Mild thickening of the duodenum query duodenitis. 5. Similar thickened abnormal appearance of the proximal descending colon as before. 6. Slightly smaller appearing peritoneal implants in the left upper quadrant. Electronically Signed   By: Ashley Royalty M.D.   On: 09/13/2018 03:25   Dg Chest Port 1 View  Result Date: 09/12/2018 CLINICAL DATA:  Weakness and fever EXAM: PORTABLE CHEST 1 VIEW COMPARISON:  CT 07/07/2018, radiograph 01/08/2018 FINDINGS: Right-sided central venous port tip over the SVC. No focal opacity or pleural effusion. Normal cardiomediastinal silhouette. No pneumothorax. IMPRESSION: No active disease. Electronically Signed   By: Donavan Foil M.D.   On: 09/12/2018 21:26        Scheduled Meds: . feeding supplement (ENSURE ENLIVE)  237 mL Oral TID BM  . mouth rinse  15 mL Mouth Rinse BID  . mirtazapine  15 mg Oral QHS  . morphine  15 mg Oral Q12H  . nystatin  5 mL Oral Q6H  . Rivaroxaban  15 mg Oral Q lunch   Continuous Infusions: . ceFEPime (MAXIPIME) IV 2 g (09/14/18 0511)  . metronidazole 500 mg (09/14/18 0552)  . vancomycin 750 mg (09/14/18 1040)     LOS: 2 days     Cordelia Poche, MD [facility] 09/14/2018, 2:07 PM Pager: [phone]  If 7PM-7AM, please contact night-coverage [email or web address] 09/14/2018, 2:07 PM

## 2018-09-14 NOTE — Plan of Care (Signed)
  Problem: Fluid Volume: Goal: Hemodynamic stability will improve Outcome: Progressing   Problem: Clinical Measurements: Goal: Diagnostic test results will improve Outcome: Progressing Goal: Signs and symptoms of infection will decrease Outcome: Progressing   Problem: Respiratory: Goal: Ability to maintain adequate ventilation will improve Outcome: Progressing   Problem: Education: Goal: Knowledge of General Education information will improve Description Including pain rating scale, medication(s)/side effects and non-pharmacologic comfort measures Outcome: Progressing   Problem: Health Behavior/Discharge Planning: Goal: Ability to manage health-related needs will improve Outcome: Progressing   Problem: Clinical Measurements: Goal: Ability to maintain clinical measurements within normal limits will improve Outcome: Progressing Goal: Will remain free from infection Outcome: Progressing Goal: Diagnostic test results will improve Outcome: Progressing Goal: Respiratory complications will improve Outcome: Progressing Goal: Cardiovascular complication will be avoided Outcome: Progressing   Problem: Activity: Goal: Risk for activity intolerance will decrease Outcome: Progressing   Problem: Nutrition: Goal: Adequate nutrition will be maintained Outcome: Progressing   Problem: Coping: Goal: Level of anxiety will decrease Outcome: Progressing   Problem: Pain Managment: Goal: General experience of comfort will improve Outcome: Progressing   Problem: Safety: Goal: Ability to remain free from injury will improve Outcome: Progressing   Problem: Elimination: Goal: Will not experience complications related to bowel motility Outcome: Progressing Goal: Will not experience complications related to urinary retention Outcome: Progressing   Problem: Skin Integrity: Goal: Risk for impaired skin integrity will decrease Outcome: Progressing   Problem: Pain Managment: Goal:  General experience of comfort will improve Outcome: Progressing   Problem: Skin Integrity: Goal: Risk for impaired skin integrity will decrease Outcome: Progressing

## 2018-09-15 DIAGNOSIS — E860 Dehydration: Secondary | ICD-10-CM

## 2018-09-15 NOTE — Progress Notes (Signed)
PROGRESS NOTE    Jeff [name]  OVZ:[id number] [name]: May 03, 1961 [name]: 09/12/2018 PCP: [name], MD   Brief Narrative: Jeff [name] is a 57 y.o. male with history of stage IV colon cancer status post colostomy on chemotherapy, DVT status post IVC filter, anemia, pancreatic abscess. He presented with persistent fevers with concern for sepsis. He was started on empiric antibiotics. No source identified to date. Clinically, is doing better but not stable for discharge.   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 Fever and leukopenia in setting of chemotherapy. Started on empiric antibiotics. Urine culture without growth. Blood culture no growth to date. CT abdomen/pelvis not helpful for diagnosis. Respiratory panel negative. CT head/chest negative for source of infection. CT maxillofacial significant for no abscess. -Continue Vancomycin, Cefepime, Flagyl -Blood cultures  Metastatic colon cancer Patient is on chemotherapy. Follows with [name]  History of DVT -Continue Xarelto  Pancytopenia In setting of chemotherapy.  Severe malnutrition Dietitian consulted -Ensure Enlive  Hypomagnesemia Resolved with supplementation.   DVT prophylaxis: Xarelto Code Status:   Code Status: Full Code Family Communication: None at bedside. Daughter on telephone Disposition Plan: Discharge likely in 24 hours if continues to be afebrile   Consultants:   None  Procedures:   None  Antimicrobials:  Vancomycin  Cefepime  Flagyl    Subjective: No issues overnight.  Objective: Vitals:   09/14/18 0051  09/14/18 0534 09/14/18 2050 09/15/18 0406  BP:  98/69 110/85 122/89  Pulse:  69 76 78  Resp:  16 18 18   Temp: 99.3 F (37.4 C) 97.8 F (36.6 C) 99.6 F (37.6 C) 98.5 F (36.9 C)  TempSrc: Oral Oral Oral Oral  SpO2:  99% 100% 100%  Weight:      Height:        Intake/Output Summary (Last 24 hours) at 09/15/2018 0749 Last data filed at 09/15/2018 0615 Gross per 24 hour  Intake 1293.31 ml  Output 2000 ml  Net -706.69 ml   Filed Weights   09/12/18 2048 09/13/18 0040  Weight: 54 kg 55.1 kg    Examination:  General exam: Appears calm and comfortable Respiratory system: Clear to auscultation. Respiratory effort normal. Cardiovascular system: S1 & S2 heard, RRR. No murmurs, rubs, gallops or clicks. Gastrointestinal system: Abdomen is nondistended, soft and nontender. No organomegaly or masses felt. Normal bowel sounds heard. Central nervous system: Alert and oriented. No focal neurological deficits. Extremities: No edema. No calf tenderness Skin: No cyanosis. No rashes Psychiatry: Judgement and insight appear normal. Mood & affect appropriate.     Data Reviewed: I have personally reviewed following labs and imaging studies  CBC: Recent Labs  Lab 09/08/18 0751 09/12/18 2047 09/13/18 0239 09/14/18 0359  WBC 3.6* 1.6* 3.0* 3.7*  NEUTROABS 1.8 1.4*  --  2.4  HGB 9.9* 8.7* 8.0* 8.7*  HCT 30.9* 26.3* 24.8* 26.6*  MCV 89.8 86.8 87.6 87.5  PLT 161 161 138* 412*   Basic Metabolic Panel: Recent Labs  Lab 09/08/18 0751 09/12/18 2047 09/13/18 0239 09/14/18 0359  NA 136 134* 137 139  K 3.9 3.7 3.8  3.6  CL 102 101 103 109  CO2 24 22 24 23   GLUCOSE 138* 158* 114* 124*  BUN 7 12 10 8   CREATININE 0.74 0.89 0.68 0.80  CALCIUM 9.5 8.3* 8.1* 8.0*  MG  --   --  1.6* 2.0  PHOS  --   --  3.3  --    GFR: Estimated Creatinine Clearance: 77.5 mL/min (by C-G formula based on SCr of 0.8 mg/dL). Liver Function Tests: Recent Labs  Lab 09/08/18 0751 09/12/18 2047 09/13/18 0239  09/14/18 0359  AST 10* 21 13* 11*  ALT 6 8 8 7   ALKPHOS 78 64 65 64  BILITOT 0.3 0.4 0.5 0.6  PROT 8.2* 6.8 6.2* 6.1*  ALBUMIN 2.5* 2.3* 2.1* 1.9*   No results for input(s): LIPASE, AMYLASE in the last 168 hours. No results for input(s): AMMONIA in the last 168 hours. Coagulation Profile: Recent Labs  Lab 09/12/18 2047  INR 2.35   Cardiac Enzymes: No results for input(s): CKTOTAL, CKMB, CKMBINDEX, TROPONINI in the last 168 hours. BNP (last 3 results) No results for input(s): PROBNP in the last 8760 hours. HbA1C: No results for input(s): HGBA1C in the last 72 hours. CBG: No results for input(s): GLUCAP in the last 168 hours. Lipid Profile: No results for input(s): CHOL, HDL, LDLCALC, TRIG, CHOLHDL, LDLDIRECT in the last 72 hours. Thyroid Function Tests: Recent Labs    09/13/18 0239  TSH 0.676   Anemia Panel: No results for input(s): VITAMINB12, FOLATE, FERRITIN, TIBC, IRON, RETICCTPCT in the last 72 hours. Sepsis Labs: Recent Labs  Lab 09/12/18 2051 09/12/18 2328 09/13/18 0239  PROCALCITON  --  30.32  --   LATICACIDVEN 2.81* 1.0 0.9    Recent Results (from the past 240 hour(s))  Culture, blood (Routine x 2)     Status: None (Preliminary result)   Collection Time: 09/12/18  9:38 PM  Result Value Ref Range Status   Specimen Description   Final    BLOOD RIGHT HAND Performed at [facility], [place] [address], [place], [place] [address]    Special Requests   Final    BOTTLES DRAWN AEROBIC AND ANAEROBIC Blood Culture adequate volume Performed at [place] [address] [place], [place] [address]    Culture   Final    NO GROWTH 2 DAYS Performed at [place] [address], [place], [place] [address]    Report Status PENDING  Incomplete  Culture, blood (Routine x 2)     Status: None (Preliminary result)   Collection Time: 09/12/18 10:40 PM  Result Value Ref Range Status   Specimen Description BLOOD RIGHT  FOREARM  Final   Special Requests   Final    BOTTLES DRAWN AEROBIC AND ANAEROBIC Blood Culture results may not be optimal due to an inadequate volume of blood received in culture bottles   Culture   Final    NO GROWTH 2 DAYS Performed at [facility], [place] [address] [place], [place] [address]    Report Status PENDING  Incomplete  Urine culture     Status: None   Collection Time: 09/12/18 11:27 PM  Result Value Ref Range Status   Specimen Description   Final    URINE, CLEAN CATCH Performed at [facility], [place] [address], [place], [place] [address]    Special Requests   Final    NONE Performed at [facility], [place] [address] [place], [place] [address]  Culture   Final    NO GROWTH Performed at [facility], [place] [address] [place], [place] [address]    Report Status 09/14/2018 FINAL  Final  Respiratory Panel by PCR     Status: None   Collection Time: 09/13/18  1:44 AM  Result Value Ref Range Status   Adenovirus NOT DETECTED NOT DETECTED Final   Coronavirus 229E NOT DETECTED NOT DETECTED Final   Coronavirus HKU1 NOT DETECTED NOT DETECTED Final   Coronavirus NL63 NOT DETECTED NOT DETECTED Final   Coronavirus OC43 NOT DETECTED NOT DETECTED Final   Metapneumovirus NOT DETECTED NOT DETECTED Final   Rhinovirus / Enterovirus NOT DETECTED NOT DETECTED Final   Influenza A NOT DETECTED NOT DETECTED Final   Influenza B NOT DETECTED NOT DETECTED Final   Parainfluenza Virus 1 NOT DETECTED NOT DETECTED Final   Parainfluenza Virus 2 NOT DETECTED NOT DETECTED Final   Parainfluenza Virus 3 NOT DETECTED NOT DETECTED Final   Parainfluenza Virus 4 NOT DETECTED NOT DETECTED Final   Respiratory Syncytial Virus NOT DETECTED NOT DETECTED Final   Bordetella pertussis NOT DETECTED NOT DETECTED Final   Chlamydophila pneumoniae NOT DETECTED NOT DETECTED Final   Mycoplasma pneumoniae NOT DETECTED NOT DETECTED Final    Comment: Performed at [facility], [place] [address] [place], [place] [address]  MRSA PCR Screening     Status: None   Collection Time: 09/13/18  1:44 AM  Result Value Ref Range Status   MRSA by PCR NEGATIVE NEGATIVE Final    Comment:        The GeneXpert MRSA Assay (FDA approved for NASAL specimens only), is one component of a comprehensive MRSA colonization surveillance program. It is not intended to diagnose MRSA infection nor to guide or monitor treatment for MRSA infections. Performed at [facility], [place] [address], [place], [place] [address]          Radiology Studies: Ct Head Wo Contrast  Result Date: 09/14/2018 CLINICAL DATA:  Altered mental status and possible sepsis. EXAM: CT HEAD WITHOUT CONTRAST CT MAXILLOFACIAL WITHOUT CONTRAST TECHNIQUE: Multidetector CT imaging of the head and maxillofacial structures were performed using the standard protocol without intravenous contrast. Multiplanar CT image reconstructions of the maxillofacial structures were also generated. COMPARISON:  Head CT 11/30/2014 FINDINGS: CT HEAD FINDINGS Brain: There is no mass, hemorrhage or extra-axial collection. The size and configuration of the ventricles and extra-axial CSF spaces are normal. There is no acute or chronic infarction. The brain parenchyma is normal. Vascular: No hyperdense vessel or unexpected vascular calcification. Skull: The visualized skull base, calvarium and extracranial soft tissues are normal. CT MAXILLOFACIAL FINDINGS Osseous: --Complex facial fracture types: No LeFort, zygomaticomaxillary complex or nasoorbitoethmoidal fracture. --Simple fracture types: None. --Mandible, hard palate and teeth: No acute abnormality. Multiple dental caries and periapical lucencies. Orbits: The globes are intact. Normal appearance of the intra- and extraconal fat. Symmetric extraocular muscles. Sinuses: No fluid levels or advanced mucosal thickening. Soft tissues: Normal visualized extracranial soft  tissues. IMPRESSION: 1. No acute intracranial abnormality. 2. Clear paranasal sinuses. 3. Poor dentition with multiple dental caries and periapical lucencies but no odontogenic abscess or cellulitis. Electronically Signed   By: Ulyses Jarred M.D.   On: 09/14/2018 16:39   Ct Chest W Contrast  Result Date: 09/14/2018 CLINICAL DATA:  Fever with concern for sepsis. History of stage IV colon cancer with colostomy on chemotherapy. History of DVT and pancreatic abscess. EXAM: CT CHEST WITH CONTRAST TECHNIQUE: Multidetector  CT imaging of the chest was performed during intravenous contrast administration. CONTRAST:  46mL OMNIPAQUE IOHEXOL 300 MG/ML  SOLN COMPARISON:  CT chest 07/07/2018.  Abdominopelvic CT 09/13/2018. FINDINGS: Cardiovascular: Right IJ Port-A-Cath extends to the level of the mid SVC. No acute vascular findings. The heart size is normal. There is no pericardial effusion. Mediastinum/Nodes: There are no enlarged mediastinal, hilar or axillary lymph nodes.There is a stable 8 mm right pericardiac node on image 124/2. The thyroid gland, trachea and esophagus demonstrate no significant findings. Lungs/Pleura: New small left-greater-than-right pleural effusions with associated dependent bibasilar atelectasis compared with previous chest CT, not significantly changed from yesterday's abdominal CT. There is no confluent airspace opacity or suspicious pulmonary nodule. Upper abdomen: Irregular mass involving the splenic hilum measures approximately 6.8 x 6.4 cm on image 160/2, similar to recent abdominal CT. Musculoskeletal/Chest wall: There is no chest wall mass or suspicious osseous finding. Degenerative disc disease noted in the lower cervical spine. Paucity of subcutaneous fat with suspected mild generalized soft tissue edema. IMPRESSION: 1. No definite source of infection identified within the chest. 2. Small bilateral pleural effusions with bibasilar atelectasis, unchanged from abdominal CT done yesterday.  3. No thoracic adenopathy or suspicious pulmonary nodule. 4. Grossly stable appearance of the visualized upper abdomen with a known mass invading the splenic hilum. Electronically Signed   By: Richardean Sale M.D.   On: 09/14/2018 18:16   Ct Maxillofacial W Contrast  Result Date: 09/14/2018 CLINICAL DATA:  Altered mental status and possible sepsis. EXAM: CT HEAD WITHOUT CONTRAST CT MAXILLOFACIAL WITHOUT CONTRAST TECHNIQUE: Multidetector CT imaging of the head and maxillofacial structures were performed using the standard protocol without intravenous contrast. Multiplanar CT image reconstructions of the maxillofacial structures were also generated. COMPARISON:  Head CT 11/30/2014 FINDINGS: CT HEAD FINDINGS Brain: There is no mass, hemorrhage or extra-axial collection. The size and configuration of the ventricles and extra-axial CSF spaces are normal. There is no acute or chronic infarction. The brain parenchyma is normal. Vascular: No hyperdense vessel or unexpected vascular calcification. Skull: The visualized skull base, calvarium and extracranial soft tissues are normal. CT MAXILLOFACIAL FINDINGS Osseous: --Complex facial fracture types: No LeFort, zygomaticomaxillary complex or nasoorbitoethmoidal fracture. --Simple fracture types: None. --Mandible, hard palate and teeth: No acute abnormality. Multiple dental caries and periapical lucencies. Orbits: The globes are intact. Normal appearance of the intra- and extraconal fat. Symmetric extraocular muscles. Sinuses: No fluid levels or advanced mucosal thickening. Soft tissues: Normal visualized extracranial soft tissues. IMPRESSION: 1. No acute intracranial abnormality. 2. Clear paranasal sinuses. 3. Poor dentition with multiple dental caries and periapical lucencies but no odontogenic abscess or cellulitis. Electronically Signed   By: Ulyses Jarred M.D.   On: 09/14/2018 16:39        Scheduled Meds: . feeding supplement (ENSURE ENLIVE)  237 mL Oral TID BM   . mouth rinse  15 mL Mouth Rinse BID  . mirtazapine  15 mg Oral QHS  . morphine  15 mg Oral Q12H  . nystatin  5 mL Oral Q6H  . Rivaroxaban  15 mg Oral Q lunch   Continuous Infusions: . ceFEPime (MAXIPIME) IV 2 g (09/15/18 0615)  . metronidazole 500 mg (09/15/18 0505)  . vancomycin 750 mg (09/15/18 0745)     LOS: 3 days     Cordelia Poche, MD [facility] 09/15/2018, 7:49 AM Pager: [phone]  If 7PM-7AM, please contact night-coverage [email or web address] 09/15/2018, 7:49 AM

## 2018-09-16 DIAGNOSIS — C7889 Secondary malignant neoplasm of other digestive organs: Secondary | ICD-10-CM

## 2018-09-16 NOTE — Discharge Summary (Signed)
Physician Discharge Summary  Jeff [name] VEH:[id number] [name]: 08-01-1961 [name]: 09/12/2018  PCP: [name], MD  Admit date: 09/12/2018 Discharge date: 09/16/2018  Admitted From: Home Disposition: Home  Recommendations for Outpatient Follow-up:  1. Follow up with PCP in 1 week 2. Please obtain BMP/CBC in one week 3. Please follow up on the following pending results: Blood cultures  Home Health: None Equipment/Devices: None  Discharge Condition: Stable CODE STATUS: Full code Diet recommendation: Regular   Brief/Interim Summary:  Admission HPI written by Toy Baker, MD   HPI: Jeff [name] is a 57 y.o. male with medical history significant of stage IV colon cancer, DVT Feb 2018    Presented with for the past 2 days have been overall weak has not been eating or drinking anything.  She denies past had intra-abdominal abscess 1 year ago this time he feels its a bit different because he is not having as much of abdominal pain.  No sick contacts no travel history.  Has chronic cough but it is mild nonproductive otherwise no other complaints. Family  called 911 due to  persistent fevers at home  initially on arrival blood pressure 92/50 heart rate 120 he received IV fluids and Tylenol. At baseline blood pressure runs in 100 over 60s Last chemotherapy infusion was 12 September. 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 Fever and leukopenia in setting of chemotherapy. Tmax of 103.2 degrees farenheit. Urine culture without growth. Blood culture no growth to date. CT abdomen/pelvis not helpful for diagnosis. Respiratory panel negative. CT head/chest negative for source of infection. CT maxillofacial significant for no abscess.  Patient empirically treated with Vancomycin, Cefepime, Flagyl for 4 days which were discontinued prior to discharge. Discussed with ID and recommendations to discontinue antibiotics on discharge. Blood cultures pending until final.  Metastatic colon cancer Patient is on chemotherapy. Follows with [name]  History of DVT Continued Xarelto  Pancytopenia In setting of chemotherapy.  Severe malnutrition Dietitian consulted. Ensure Enlive  Hypomagnesemia Resolved with supplementation.  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   Malnutrition of moderate degree   Colostomy in place [facility])   Presence of IVC filter   Dehydration    Discharge Instructions  Discharge Instructions    Call MD for:  difficulty breathing, headache or visual disturbances   Complete by:  As directed    Call MD for:  extreme fatigue   Complete by:  As directed    Call MD for:  persistant dizziness or light-headedness   Complete by:  As directed    Call MD for:  persistant nausea and vomiting   Complete by:  As directed    Call MD for:  temperature >100.4   Complete by:  As directed    Diet - low sodium heart healthy   Complete by:  As directed    Increase activity slowly   Complete by:  As directed      Allergies as of 09/16/2018   No Known Allergies     Medication List    TAKE these  medications   chlorproMAZINE 10 MG tablet Commonly known as:  THORAZINE Take 1 tablet (10 mg total) by mouth 3 (three) times daily as needed for hiccoughs.   Clindamycin Phos-Benzoyl Perox gel Apply 1 application topically daily as needed (rash).   diphenoxylate-atropine 2.5-0.025 MG tablet Commonly known as:  LOMOTIL Take 2 tablets by mouth 4 (four) times daily as needed for  diarrhea or loose stools.   docusate sodium 100 MG capsule Commonly known as:  COLACE Take 100 mg by mouth 2 (two) times daily as needed for mild constipation.   feeding supplement (ENSURE ENLIVE) Liqd Take 237 mLs by mouth 3 (three) times daily.   ferrous sulfate 325 (65 FE) MG tablet Take 1 tablet (325 mg total) by mouth 2 (two) times daily with a meal.   gabapentin 300 MG capsule Commonly known as:  NEURONTIN Take 1 capsule (300 mg total) by mouth 3 (three) times daily.   hydrocortisone 2.5 % cream Apply topically 2 (two) times daily. What changed:    how much to take  when to take this  reasons to take this   magnesium oxide 400 (241.3 Mg) MG tablet Commonly known as:  MAG-OX Take 1 tablet (400 mg total) by mouth daily.   mirtazapine 15 MG tablet Commonly known as:  REMERON Take 1 tablet (15 mg total) by mouth at bedtime.   morphine 15 MG 12 hr tablet Commonly known as:  MS CONTIN Take 1 tablet (15 mg total) by mouth every 12 (twelve) hours.   nystatin 100000 UNIT/ML suspension Commonly known as:  MYCOSTATIN Take 5 mLs (500,000 Units total) by mouth 4 (four) times daily. What changed:  Another medication with the same name was removed. Continue taking this medication, and follow the directions you see here.   ondansetron 8 MG tablet Commonly known as:  ZOFRAN Take 1 tablet (8 mg total) by mouth 2 (two) times daily as needed for refractory nausea / vomiting. Start on day 3 after chemotherapy.   Oxycodone HCl 10 MG Tabs Take 1 tablet (10 mg total) by mouth every 6 (six) hours as needed. What changed:  reasons to take this   potassium chloride SA 20 MEQ tablet Commonly known as:  K-DUR,KLOR-CON Take 1 tablet (20 mEq total) by mouth daily.   prochlorperazine 10 MG tablet Commonly known as:  COMPAZINE Take 1 tablet (10 mg total) by mouth every 6 (six) hours as needed (Nausea or vomiting).   Rivaroxaban 15 MG Tabs tablet Commonly known as:  XARELTO Take 1  tablet (15 mg total) by mouth daily.   trifluridine-tipiracil 20-8.19 MG tablet Commonly known as:  LONSURF Take 3 tablets (60mg  trifluridine) by mouth 2 times daily, within 1 hr after AM & PM meals. Take on days 1-5 & 8-12 of each 28d cycle      Follow-up Information    [name], MD. Schedule an appointment as soon as possible for a visit in 1 week(s).   Specialties:  Hematology, Oncology Contact information: [place] [address] [phone]          No Known Allergies  Consultations:  Infectious disease   Procedures/Studies: Ct Head Wo Contrast  Result Date: 09/14/2018 CLINICAL DATA:  Altered mental status and possible sepsis. EXAM: CT HEAD WITHOUT CONTRAST CT MAXILLOFACIAL WITHOUT CONTRAST TECHNIQUE: Multidetector CT imaging of the head and maxillofacial structures were performed using the standard protocol without intravenous contrast. Multiplanar CT image reconstructions of the maxillofacial structures were also generated. COMPARISON:  Head CT 11/30/2014 FINDINGS: CT HEAD FINDINGS Brain: There is no mass, hemorrhage or extra-axial collection. The size and configuration of the ventricles and extra-axial CSF spaces are normal. There is no acute or chronic infarction. The brain parenchyma is normal. Vascular: No hyperdense vessel or unexpected vascular calcification. Skull: The visualized skull base, calvarium and extracranial soft tissues are normal. CT MAXILLOFACIAL FINDINGS Osseous: --Complex facial fracture types: No LeFort, zygomaticomaxillary complex or nasoorbitoethmoidal fracture. --Simple fracture types: None. --Mandible, hard palate and teeth: No acute abnormality. Multiple dental caries and periapical lucencies. Orbits: The globes are intact. Normal appearance of the intra- and extraconal fat. Symmetric extraocular muscles. Sinuses: No fluid levels or advanced mucosal thickening. Soft tissues: Normal visualized extracranial soft tissues.  IMPRESSION: 1. No acute intracranial abnormality. 2. Clear paranasal sinuses. 3. Poor dentition with multiple dental caries and periapical lucencies but no odontogenic abscess or cellulitis. Electronically Signed   By: Ulyses Jarred M.D.   On: 09/14/2018 16:39   Ct Chest W Contrast  Result Date: 09/14/2018 CLINICAL DATA:  Fever with concern for sepsis. History of stage IV colon cancer with colostomy on chemotherapy. History of DVT and pancreatic abscess. EXAM: CT CHEST WITH CONTRAST TECHNIQUE: Multidetector CT imaging of the chest was performed during intravenous contrast administration. CONTRAST:  61mL OMNIPAQUE IOHEXOL 300 MG/ML  SOLN COMPARISON:  CT chest 07/07/2018.  Abdominopelvic CT 09/13/2018. FINDINGS: Cardiovascular: Right IJ Port-A-Cath extends to the level of the mid SVC. No acute vascular findings. The heart size is normal. There is no pericardial effusion. Mediastinum/Nodes: There are no enlarged mediastinal, hilar or axillary lymph nodes.There is a stable 8 mm right pericardiac node on image 124/2. The thyroid gland, trachea and esophagus demonstrate no significant findings. Lungs/Pleura: New small left-greater-than-right pleural effusions with associated dependent bibasilar atelectasis compared with previous chest CT, not significantly changed from yesterday's abdominal CT. There is no confluent airspace opacity or suspicious pulmonary nodule. Upper abdomen: Irregular mass involving the splenic hilum measures approximately 6.8 x 6.4 cm on image 160/2, similar to recent abdominal CT. Musculoskeletal/Chest wall: There is no chest wall mass or suspicious osseous finding. Degenerative disc disease noted in the lower cervical spine. Paucity of subcutaneous fat with suspected mild generalized soft tissue edema. IMPRESSION: 1. No definite source of infection identified within the chest. 2. Small bilateral pleural effusions with bibasilar atelectasis, unchanged from abdominal CT done yesterday. 3. No  thoracic adenopathy or suspicious pulmonary nodule. 4. Grossly stable appearance of the visualized upper abdomen with a known mass invading the splenic hilum. Electronically Signed   By: Richardean Sale M.D.   On: 09/14/2018 18:16   Ct Abdomen Pelvis W Contrast  Result Date: 09/13/2018 CLINICAL DATA:  Abdominal pain, known stomach cancer. Stage IV colon cancer metastatic to the spleen. EXAM: CT ABDOMEN AND PELVIS WITH CONTRAST TECHNIQUE: Multidetector CT imaging of the abdomen and pelvis was performed using the standard protocol following bolus administration of intravenous contrast. CONTRAST:  157mL ISOVUE-300 IOPAMIDOL (ISOVUE-300) INJECTION 61% COMPARISON:  CT 03/31/2018 and 07/07/2018 FINDINGS: Lower chest: The included heart size is normal without pericardial effusion or thickening. New small left and trace right pleural effusions are identified since the most recent comparison. No confluent airspace disease. Subsegmental atelectasis is seen at lung bases. No suspicious pulmonary masses at the lung bases. Hepatobiliary: Subcapsular hypervascular blush measuring 8 mm, series 2/15 is noted in the region right hepatic lobe, slightly more prominent than on prior exam. Hypervascular metastasis is not excluded. Gallbladder is unremarkable and free of stones.  Pancreas: No ductal dilatation or enhancing mass. Hypodense appearance in the region of the pancreatic tail appears to be extrinsic to the pancreas on coronal reformats and contiguous with known cystic mass in the left upper quadrant at the level of the splenic hilum. Spleen: Interval increase in hypodense mass at the splenic hilum with local invasion of the splenic parenchyma, now measuring 6.8 x 6.6 cm, series 2/20 versus 7.2 x 4.7 cm, series 2/65 previously. More linear hypodensities are seen within the main year of the splenic parenchyma, nonspecific but could represent further invasion of the spleen. Adrenals/Urinary Tract: Normal bilateral adrenal  glands. Symmetric cortical enhancement of both kidneys. No renal mass nor obstructive uropathy. Stomach/Bowel: Small to moderate-sized hiatal hernia. Fluid and air-filled stomach without focal mural thickening. Mild transmural thickening of the third and fourth portions of the duodenum some which could be due to duodenitis and/or underdistention. Redemonstration of right anterior abdominal wall colostomy. Thickened masslike abnormality of the proximal descending colon is again noted without significant interval change measuring at least 5.3 x 4.5 x 7.2 cm, series 2/32 and coronal image 49. Vascular/Lymphatic: Stent in the left iliac vein. Normal caliber atherosclerotic abdominal aorta. Patent branch vessels. The cystic mass at the splenic hilum appears to attenuate a branch off the splenic artery, series 2/20. Smaller left para-aortic lymph node measuring 7 mm versus 10 mm previously. Reproductive: Enlarged prostate.  Normal seminal vesicles. Other: Previously suspected peritoneal implant in the left upper quadrant is more difficult to separate from the adjacent colonic lesion and splenic metastasis. It appears to measure slightly less though than on prior exam, estimated at 2.8 x 1.5 cm versus 4.2 x 2.7 cm previously. Musculoskeletal: No aggressive osseous lesions. Degenerative disc disease L5-S1. Bridging osteophyte of the left SI joint. IMPRESSION: 1. Trace right and small left pleural effusions, new since prior. 2. Faint 8 mm hypervascular blush in the right hepatic lobe, nonspecific but given history of colon cancer, cannot exclude a hypervascular metastatic focus. 3. Interval increase in hypodense mass at the splenic hilum insinuating into the splenic parenchyma. 4. Mild thickening of the duodenum query duodenitis. 5. Similar thickened abnormal appearance of the proximal descending colon as before. 6. Slightly smaller appearing peritoneal implants in the left upper quadrant. Electronically Signed   By: Ashley Royalty M.D.   On: 09/13/2018 03:25   Ct Maxillofacial W Contrast  Result Date: 09/14/2018 CLINICAL DATA:  Altered mental status and possible sepsis. EXAM: CT HEAD WITHOUT CONTRAST CT MAXILLOFACIAL WITHOUT CONTRAST TECHNIQUE: Multidetector CT imaging of the head and maxillofacial structures were performed using the standard protocol without intravenous contrast. Multiplanar CT image reconstructions of the maxillofacial structures were also generated. COMPARISON:  Head CT 11/30/2014 FINDINGS: CT HEAD FINDINGS Brain: There is no mass, hemorrhage or extra-axial collection. The size and configuration of the ventricles and extra-axial CSF spaces are normal. There is no acute or chronic infarction. The brain parenchyma is normal. Vascular: No hyperdense vessel or unexpected vascular calcification. Skull: The visualized skull base, calvarium and extracranial soft tissues are normal. CT MAXILLOFACIAL FINDINGS Osseous: --Complex facial fracture types: No LeFort, zygomaticomaxillary complex or nasoorbitoethmoidal fracture. --Simple fracture types: None. --Mandible, hard palate and teeth: No acute abnormality. Multiple dental caries and periapical lucencies. Orbits: The globes are intact. Normal appearance of the intra- and extraconal fat. Symmetric extraocular muscles. Sinuses: No fluid levels or advanced mucosal thickening. Soft tissues: Normal visualized extracranial soft tissues. IMPRESSION: 1. No acute intracranial abnormality. 2. Clear paranasal sinuses. 3. Poor dentition with  multiple dental caries and periapical lucencies but no odontogenic abscess or cellulitis. Electronically Signed   By: Ulyses Jarred M.D.   On: 09/14/2018 16:39   Dg Chest Port 1 View  Result Date: 09/12/2018 CLINICAL DATA:  Weakness and fever EXAM: PORTABLE CHEST 1 VIEW COMPARISON:  CT 07/07/2018, radiograph 01/08/2018 FINDINGS: Right-sided central venous port tip over the SVC. No focal opacity or pleural effusion. Normal cardiomediastinal  silhouette. No pneumothorax. IMPRESSION: No active disease. Electronically Signed   By: Donavan Foil M.D.   On: 09/12/2018 21:26      Subjective: No complaints.  Discharge Exam: Vitals:   09/15/18 2056 09/16/18 0540  BP: 115/85 117/87  Pulse: 73 71  Resp: 18 18  Temp: 98.7 F (37.1 C) 98.1 F (36.7 C)  SpO2: 100% 100%   Vitals:   09/15/18 0406 09/15/18 1248 09/15/18 2056 09/16/18 0540  BP: 122/89 111/84 115/85 117/87  Pulse: 78 71 73 71  Resp: 18  18 18   Temp: 98.5 F (36.9 C) 99 F (37.2 C) 98.7 F (37.1 C) 98.1 F (36.7 C)  TempSrc: Oral Oral Oral Oral  SpO2: 100% 100% 100% 100%  Weight:      Height:        General: Pt is alert, awake, not in acute distress Cardiovascular: RRR, S1/S2 +, no rubs, no gallops Respiratory: CTA bilaterally, no wheezing, no rhonchi Abdominal: Soft, NT, ND, bowel sounds + Extremities: no edema, no cyanosis    The results of significant diagnostics from this hospitalization (including imaging, microbiology, ancillary and laboratory) are listed below for reference.     Microbiology: Recent Results (from the past 240 hour(s))  Culture, blood (Routine x 2)     Status: None (Preliminary result)   Collection Time: 09/12/18  9:38 PM  Result Value Ref Range Status   Specimen Description   Final    BLOOD RIGHT HAND Performed at [facility] [address] [place], [place] [address]    Special Requests   Final    BOTTLES DRAWN AEROBIC AND ANAEROBIC Blood Culture adequate volume Performed at [facility] [address], [place], [place] [address]    Culture   Final    NO GROWTH 3 DAYS Performed at [facility], [place] [address], [place], [place] [address]    Report Status PENDING  Incomplete  Culture, blood (Routine x 2)     Status: None (Preliminary result)   Collection Time: 09/12/18 10:40 PM  Result Value Ref Range Status   Specimen Description BLOOD RIGHT FOREARM  Final   Special  Requests   Final    BOTTLES DRAWN AEROBIC AND ANAEROBIC Blood Culture results may not be optimal due to an inadequate volume of blood received in culture bottles   Culture   Final    NO GROWTH 3 DAYS Performed at [facility], [place] [address] [place], [place] [address]    Report Status PENDING  Incomplete  Urine culture     Status: None   Collection Time: 09/12/18 11:27 PM  Result Value Ref Range Status   Specimen Description   Final    URINE, CLEAN CATCH Performed at [facility], [place] [address], [place], [place] [address]    Special Requests   Final    NONE Performed at [facility], [place] [address] [place], [place] [address]    Culture   Final    NO GROWTH Performed at [facility],  [address] [place], [place] [address]    Report Status 09/14/2018 FINAL  Final  Respiratory Panel by PCR     Status: None   Collection Time: 09/13/18  1:44 AM  Result Value Ref Range Status   Adenovirus NOT DETECTED NOT DETECTED Final   Coronavirus 229E NOT DETECTED NOT DETECTED Final   Coronavirus HKU1 NOT DETECTED NOT DETECTED Final   Coronavirus NL63 NOT DETECTED NOT DETECTED Final   Coronavirus OC43 NOT DETECTED NOT DETECTED Final   Metapneumovirus NOT DETECTED NOT DETECTED Final   Rhinovirus / Enterovirus NOT DETECTED NOT DETECTED Final   Influenza A NOT DETECTED NOT DETECTED Final   Influenza B NOT DETECTED NOT DETECTED Final   Parainfluenza Virus 1 NOT DETECTED NOT DETECTED Final   Parainfluenza Virus 2 NOT DETECTED NOT DETECTED Final   Parainfluenza Virus 3 NOT DETECTED NOT DETECTED Final   Parainfluenza Virus 4 NOT DETECTED NOT DETECTED Final   Respiratory Syncytial Virus NOT DETECTED NOT DETECTED Final   Bordetella pertussis NOT DETECTED NOT DETECTED Final   Chlamydophila pneumoniae NOT DETECTED NOT DETECTED Final   Mycoplasma pneumoniae NOT DETECTED NOT DETECTED Final    Comment: Performed at [facility], [place]  [address] [place], [place] [address]  MRSA PCR Screening     Status: None   Collection Time: 09/13/18  1:44 AM  Result Value Ref Range Status   MRSA by PCR NEGATIVE NEGATIVE Final    Comment:        The GeneXpert MRSA Assay (FDA approved for NASAL specimens only), is one component of a comprehensive MRSA colonization surveillance program. It is not intended to diagnose MRSA infection nor to guide or monitor treatment for MRSA infections. Performed at [facility], [place] [address], [place], [place] [address]      Labs: BNP (last 3 results) No results for input(s): BNP in the last 8760 hours. Basic Metabolic Panel: Recent Labs  Lab 09/12/18 2047 09/13/18 0239 09/14/18 0359  NA 134* 137 139  K 3.7 3.8 3.6  CL 101 103 109  CO2 22 24 23   GLUCOSE 158* 114* 124*  BUN 12 10 8   CREATININE 0.89 0.68 0.80  CALCIUM 8.3* 8.1* 8.0*  MG  --  1.6* 2.0  PHOS  --  3.3  --    Liver Function Tests: Recent Labs  Lab 09/12/18 2047 09/13/18 0239 09/14/18 0359  AST 21 13* 11*  ALT 8 8 7   ALKPHOS 64 65 64  BILITOT 0.4 0.5 0.6  PROT 6.8 6.2* 6.1*  ALBUMIN 2.3* 2.1* 1.9*   No results for input(s): LIPASE, AMYLASE in the last 168 hours. No results for input(s): AMMONIA in the last 168 hours. CBC: Recent Labs  Lab 09/12/18 2047 09/13/18 0239 09/14/18 0359  WBC 1.6* 3.0* 3.7*  NEUTROABS 1.4*  --  2.4  HGB 8.7* 8.0* 8.7*  HCT 26.3* 24.8* 26.6*  MCV 86.8 87.6 87.5  PLT 161 138* 130*   Cardiac Enzymes: No results for input(s): CKTOTAL, CKMB, CKMBINDEX, TROPONINI in the last 168 hours. BNP: Invalid input(s): POCBNP CBG: No results for input(s): GLUCAP in the last 168 hours. D-Dimer No results for input(s): DDIMER in the last 72 hours. Hgb A1c No results for input(s): HGBA1C in the last 72 hours. Lipid Profile No results for input(s): CHOL, HDL, LDLCALC, TRIG, CHOLHDL, LDLDIRECT in the last 72 hours. Thyroid function studies No results for input(s): TSH,  T4TOTAL, T3FREE, THYROIDAB in the last 72 hours.  Invalid  input(s): FREET3 Anemia work up No results for input(s): VITAMINB12, FOLATE, FERRITIN, TIBC, IRON, RETICCTPCT in the last 72 hours. Urinalysis    Component Value Date/Time   COLORURINE YELLOW 09/12/2018 2327   APPEARANCEUR CLEAR 09/12/2018 2327   LABSPEC 1.008 09/12/2018 2327   PHURINE 6.0 09/12/2018 2327   GLUCOSEU NEGATIVE 09/12/2018 2327   HGBUR NEGATIVE 09/12/2018 2327   BILIRUBINUR NEGATIVE 09/12/2018 2327   KETONESUR NEGATIVE 09/12/2018 2327   PROTEINUR NEGATIVE 09/12/2018 2327   NITRITE NEGATIVE 09/12/2018 2327   LEUKOCYTESUR NEGATIVE 09/12/2018 2327   Sepsis Labs Invalid input(s): PROCALCITONIN,  WBC,  LACTICIDVEN Microbiology Recent Results (from the past 240 hour(s))  Culture, blood (Routine x 2)     Status: None (Preliminary result)   Collection Time: 09/12/18  9:38 PM  Result Value Ref Range Status   Specimen Description   Final    BLOOD RIGHT HAND Performed at [facility], [place] [address] [place], [place] [address]    Special Requests   Final    BOTTLES DRAWN AEROBIC AND ANAEROBIC Blood Culture adequate volume Performed at [facility] [address], [place], [place] [address]    Culture   Final    NO GROWTH 3 DAYS Performed at [facility], [place] [address] [place], [place] [address]    Report Status PENDING  Incomplete  Culture, blood (Routine x 2)     Status: None (Preliminary result)   Collection Time: 09/12/18 10:40 PM  Result Value Ref Range Status   Specimen Description BLOOD RIGHT FOREARM  Final   Special Requests   Final    BOTTLES DRAWN AEROBIC AND ANAEROBIC Blood Culture results may not be optimal due to an inadequate volume of blood received in culture bottles   Culture   Final    NO GROWTH 3 DAYS Performed at [facility], [place] [address] [place], [place] [address]    Report Status PENDING  Incomplete  Urine culture     Status: None    Collection Time: 09/12/18 11:27 PM  Result Value Ref Range Status   Specimen Description   Final    URINE, CLEAN CATCH Performed at [facility], [place] [address], [place], [place] [address]    Special Requests   Final    NONE Performed at [facility], [place] [address], [place], [place] [address]    Culture   Final    NO GROWTH Performed at [facility], [place] [address], [place], [place] [address]    Report Status 09/14/2018 FINAL  Final  Respiratory Panel by PCR     Status: None   Collection Time: 09/13/18  1:44 AM  Result Value Ref Range Status   Adenovirus NOT DETECTED NOT DETECTED Final   Coronavirus 229E NOT DETECTED NOT DETECTED Final   Coronavirus HKU1 NOT DETECTED NOT DETECTED Final   Coronavirus NL63 NOT DETECTED NOT DETECTED Final   Coronavirus OC43 NOT DETECTED NOT DETECTED Final   Metapneumovirus NOT DETECTED NOT DETECTED Final   Rhinovirus / Enterovirus NOT DETECTED NOT DETECTED Final   Influenza A NOT DETECTED NOT DETECTED Final   Influenza B NOT DETECTED NOT DETECTED Final   Parainfluenza Virus 1 NOT DETECTED NOT DETECTED Final   Parainfluenza Virus 2 NOT DETECTED NOT DETECTED Final   Parainfluenza Virus 3 NOT DETECTED NOT DETECTED Final   Parainfluenza Virus 4 NOT DETECTED NOT DETECTED Final   Respiratory Syncytial Virus NOT DETECTED NOT DETECTED Final  Bordetella pertussis NOT DETECTED NOT DETECTED Final   Chlamydophila pneumoniae NOT DETECTED NOT DETECTED Final   Mycoplasma pneumoniae NOT DETECTED NOT DETECTED Final    Comment: Performed at [facility], [place] [address] [place], [place] [address]  MRSA PCR Screening     Status: None   Collection Time: 09/13/18  1:44 AM  Result Value Ref Range Status   MRSA by PCR NEGATIVE NEGATIVE Final    Comment:        The GeneXpert MRSA Assay (FDA approved for NASAL specimens only), is one component of a comprehensive MRSA colonization surveillance program. It is  not intended to diagnose MRSA infection nor to guide or monitor treatment for MRSA infections. Performed at [facility], [place] [address] [place], [place] [address]      SIGNED:   Cordelia Poche, MD [facility] 09/16/2018, 12:56 PM

## 2018-09-16 NOTE — Discharge Instructions (Signed)
Doyle were admitted because of fever and low inflammatory cell count. You were given antibiotics but no sources of infection were found. You will go home with no antibiotics for now. Please follow-up with [name]. Please return immediately if you have worsening symptoms of chills, fever, etc.

## 2018-09-18 LAB — CULTURE, BLOOD (ROUTINE X 2)
Culture: NO GROWTH
Culture: NO GROWTH
SPECIAL REQUESTS: ADEQUATE

## 2018-09-19 ENCOUNTER — Telehealth: Payer: Self-pay | Admitting: Hematology

## 2018-09-19 ENCOUNTER — Telehealth: Payer: Self-pay | Admitting: *Deleted

## 2018-09-19 NOTE — Telephone Encounter (Signed)
Yes, I will send a schedule message to see him this week. Thanks   [name] MD

## 2018-09-19 NOTE — Telephone Encounter (Signed)
Spoke with patient - patient is aware of appt date and time. - per 9/23 sch message.

## 2018-09-19 NOTE — Telephone Encounter (Signed)
Received call from daughter, [name] asking when pt's next appt is.  She reports that he was in the hospital last week & probably needs to f/u.  No appt scheduled.  Informed that message would be sent to Dr Burr Medico & someone should call her.

## 2018-09-20 NOTE — Progress Notes (Signed)
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 38.4 - 49.9 % 24.9(L) 26.6(L) 24.8(L)  Platelets 140 - 400 K/uL 247 130(L) 138(L)   CMP Latest Ref Rng &  Units 09/21/2018 09/14/2018 09/13/2018  Glucose 70 - 99 mg/dL 100(H) 124(H) 114(H)  BUN 6 - 20 mg/dL 4(L) 8 10  Creatinine 0.61 - 1.24 mg/dL 0.65 0.80 0.68  Sodium 135 - 145 mmol/L 134(L) 139 137  Potassium 3.5 - 5.1 mmol/L 4.1 3.6 3.8  Chloride 98 - 111 mmol/L 101 109 103  CO2 22 - 32 mmol/L _0 Calcium 8.9 - 10.3 mg/dL 8.8(L) 8.0(L) 8.1(L)  Total Protein 6.5 - 8.1 g/dL 7.3 6.1(L) 6.2(L)  Total Bilirubin 0.3 - 1.2 mg/dL <0.2(L) 0.6 0.5  Alkaline Phos 38 - 126 U/L 84 64 65  AST 15 - 41 U/L 9(L) 11(L) 13(L)  ALT 0 - 44 U/L <_1 CEA (0-5NG/ML) 01/22/2017: 12.31 02/16/17: 21.24 03/16/2017: 15.14 04/26/2017: 3.37 05/25/2017: 4.31 06/22/2017: 5.53 07/11/17: 4.1 07/26/17: 4.24 08/31/2017: 5.83 10/04/17: 6.07 11/01/17: 7.97 11/29/17: 9.37 12/27/17: 8.01 01/24/18: 7.57 02/21/18: 9.14 03/21/18: 9.87 04/18/18: 13.41 05/16/18: 16.15 06/20/18: 13.06 07/18/18: 19.66 08/19/18: 16.77    PATHOLOGY Diagnosis 01/12/2017 1. Colon, biopsy, splenic flexure - INVASIVE  ADENOCARCINOMA. 2. 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 - POLYPECTOMY RESECTION MARGIN IS NEGATIVE FOR DYSPLASIA.     RADIOGRAPHIC STUDIES: I have personally reviewed the radiological images as listed and agreed with the findings in the report.  09/14/2018 CT Chest IMPRESSION: 1. No definite source of infection identified within the chest. 2. Small bilateral pleural effusions with bibasilar atelectasis, unchanged from abdominal CT done yesterday. 3. No thoracic adenopathy or suspicious pulmonary nodule. 4. Grossly stable appearance of the visualized upper abdomen with a known mass invading the splenic hilum.  09/13/2018 CT AP IMPRESSION: 1. Trace right and small left pleural effusions, new since prior. 2. Faint 8 mm hypervascular blush in the right hepatic lobe, nonspecific but given history of colon cancer, cannot exclude a hypervascular metastatic focus. 3. Interval increase in hypodense mass at the splenic hilum insinuating into the splenic parenchyma. 4. Mild thickening of the duodenum query duodenitis. 5. Similar thickened abnormal appearance of the proximal descending colon as before. 6. Slightly smaller appearing peritoneal implants in the left upper quadrant.  07/07/2018 CT CAP W contrast IMPRESSION: 1. Mild interval increase in size of cystic mass within the splenic hilum. 2. Similar-appearing to mildly increased calcified soft tissue mass within the mesentery and wall thickening of the descending colon. 3. Aortic Atherosclerosis (ICD10-I70.0) and Emphysema (ICD10-J43.9).  CT CAP W Contrast 03/31/18 IMPRESSION: 1. Interval increase in size, particularly craniocaudal extent, of the cystic mass within the splenic hilum. 2. Similar-appearing partially calcified soft tissue mass within the mesentery. 3. Interval increase in wall thickening of the descending colon.  CT AP WO Contrast 01/08/18 IMPRESSION: 1. Limited  exam, without oral or IV contrast. 2. No evidence of abdominopelvic hemorrhage. 3. Small bilateral pleural effusions. 4. Grossly similar mass within the splenic hilum. 5.  Aortic Atherosclerosis (ICD10-I70.0).  Brain MRI WO Contrast 01/05/18 IMPRESSION: Negative MRI head with contrast.  Negative for metastatic disease.  CT AP W Contrast 12/30/17 IMPRESSION: 1. 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 However, there is now increasing thrombus within the left iliac and femoral veins with adjacent inflammation. 2. Mild circumferential bladder wall thickening which may be reactive or could indicate cystitis. Correlate clinically. 3. Unchanged complex cystic mass in the splenic hilum and focal wall thickening of the splenic flexure. 4.  Aortic Atherosclerosis (ICD10-I70.0).  CT CAP W Contrast 11/25/17 IMPRESSION: 1. Response to therapy of intraperitoneal  metastasis, including within the splenic hilum. 2.  No acute process or evidence of metastatic disease in the chest. 3. Persistent splenic flexure colonic wall thickening, with right-sided colostomy in place. 4.  Aortic Atherosclerosis (ICD10-I70.0).  CT AP W Contrast 08/15/17 IMPRESSION: 1. Early or new small bowel obstruction with a transition point in the right abdomen as above. The adjacent swirling vessels are most consistent with an internal hernia. 2. The mass involving the pancreatic tail and spleen is similar in the interval. 3. The previously identified abscess inferior to the pancreatic mass has resolved with minimal fluid in this region. The tube is been removed. 4. Continued thickening of the colon as above consistent with the patient's known malignancy. 5. Atherosclerotic changes in the aorta. Aortic Atherosclerosis (ICD10-I70.0).   CT AP W Contrast 07/29/17 IMPRESSION: 1. Bowel obstruction at the level of the proximal descending colon at the location of a percutaneous drainage catheter. This could  be secondary to wall thickening by the adjacent inflammatory process. Wall thickening due to colon neoplasm also remains a possibility. 2. Dilated, fluid-filled appendix, similar to the dilated loops of colon and small bowel, compatible with dilatation due to the bowel obstruction. There are no findings to indicate appendicitis. 3. Small amount of free peritoneal fluid. 4. The percutaneously drained fluid collection in the left mid abdomen has with resolved. 5. Mild decrease in size of the multiloculated fluid collection in the splenic hilum and distal tail of the pancreas. 6. Stable 8 mm diffusely enhancing mass in the dome of the liver on the right. This is most likely benign. A solitary enhancing metastasis is less likely. 7. Moderate prostatic hypertrophy.  CT CP W Contrast 07/10/17 IMPRESSION: The salient finding is a new abscess measuring 3.8 x 4.6 cm inferior to the pancreatic tail and spleen.  CT CAP w contrast 07/05/2017  IMPRESSION: 1. Today's study demonstrates some positive response to therapy with decreased size of mass associated with the descending colon, decreased size of the invasive mass extending from the tail of the pancreas into the splenic hilum and decreased size of adjacent peritoneal lesions. There has also been regression of upper abdominal lymphadenopathy. 2. No new pulmonary or hepatic lesions identified to suggest progressive metastatic disease. 3. Aortic atherosclerosis. 4. Additional incidental findings, as above.  CT CAP w Contrast 04/09/2017 IMPRESSION: 1. Response to therapy with decreased peritoneal tumor volume throughout the abdomen. 2. No well-defined residual colonic mass. No bowel obstruction or other acute complication. 3. Decrease in gastrohepatic ligament adenopathy. 4. No new sites of disease. 5.  No acute process or evidence of metastatic disease in the chest. 6.  Aortic atherosclerosis. 7. Mild gynecomastia.  Colonoscopy  01/12/2017 Impression: - Malignant near obstructing tumor in the descending colon and at the splenic flexure. Biopsied. Tattooed. - One 25 mm polyp at the recto-sigmoid colon, removed with a hot snare. Resected and retrieved. Clips (MR unsafe) were placed.  Lower Extremity Ultrasound 02/16/17 Bilateral lower extremity venous duplex complete. There is evidence of deep vein thrombosis involving the femoral, popliteal, and peroneal veins of the right lower extremity.  There is no evidence of superficial vein thrombosis involving the right lower extremity. There is evidence of superficial vein thrombosis involving the lesser saphenous vein of the left lower extremity. There is no evidence of deep vein thrombosis involving the left lower extremity. There is no evidence of a Baker's cyst bilaterally.  ASSESSMENT & PLAN: Jeff [name] is a 57 y.o.  African-American male, without a significant past medical  history, no routine medical care, presented with intermittent rectal bleeding, anemia  and worsening abdominal pain for 3 years.   1. 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  -His case was previously reviewed in our GI tumor Board, unfortunately he has peritoneum metastasis, with a large 11 cm peritoneal metastasis in the left upper quadrant, which is consistent with peritoneal metastasis. -We previously reviewed the natural history of metastatic colon cancer. Unfortunately his cancer is incurable at this stage. We discussed the goal of therapy is palliative, to prolong his life and improve his quality of life. -He started first line chemo FOLFOX on 02/01/17. Due to history of pancytopenia, I skipped the 5-FU bolus. -I previously discussed the [facility] genomic test result, which showed wild type KRAS, NRAS,  and BRAF, MSI-stable. Based on this and his left-sided colon cancer, he will significantly benefit from EGFR inhibitor. He has started on panitumumab and has been tolerating well. -I previously reviewed the CT scan from 04/09/17 with the patient in detail. He has had excellent partial response to the chemotherapy treatment, no other new lesions, we'll continue current chemotherapy regimen. -I reviewed his restaging CT scan from 07/05/2017, which showed a continuous response, no new lesions. We'll continue current treatment. -Due to his thrombosis and GI bleeding, he is not a candidate for Avastin for now. Given questionable disease progression, I recommend him to continue panitumumab only after cycle 11 on 07/06/18.  -Due to his recent bowel obstruction, I think he probably has had disease progression, although scan showed stable disease . I recommend to switch his chemotherapy to irinotecan alone, or FOLFIRI if he is able to tolerate. The goal of therapy is palliative. -We had again discussed the incurable nature of his cancer, an overall poor prognosis. The goal of therapy is palliative. -Started irinotecan and panitumumab on 09/21/17. Tolerated well with increased ostomy output. Added 5-fu back with cycle 2 on 10/04/17. Increased to full dose FOLFIRI on 10/18/17. Pt did not tolerate the cycle 5 FOLFIRI well, despite dose reduction on both agents. He is very reluctant to continue same regiment.  -Given his restaging CT scan showed mixed response, but overall stable disease,  I held Irinotecan, and continue 5-FU/leucovorin and Panitumumab as maintenance therapy on 11/29/17. He has tolerated maintenance therapy well.  -During his episode of extensive left lower extremity DVT on 01/25/18, his treatment was held. Chemo treatment restarted after he recovered from DVT -He had a CT AP when he was in the hospital on January 08, 2018, which showed overall stable peritoneal metastasis. -CT CAP W Contrast from 03/31/18  revealed disease progression of the splenic hilum mass, now 7.7 cm from 5.2 cm. -He restarted FOLFIRI on 04/18/18, I do not think he is a candidate for Avastin due to his thrombosis. Vectibix was stopped due to disease progression. He is tolerating dose reduced FOLFIRI well, no worsening diarrhea, it is manageable. -I previously reviewed his restaging CT from 07/07/18 with patient and daughter which shows slow, but significant progression since 01/2018.  -He started third line chemo Lonsurf on 07/25/18  -As he continues Lonsurf his abdominal pain and fatigue worsened, with significant diarrhea, no appetite and weight loss. I do not think he can tolerate Lonsurf any further, and he clinically did not response (with worsening pain), so I stopped after one cycle.  -He had  restarted FOLFIRI. Since his thrombosis has been very well controlled, he is tolerating Xarelto well, I added Avastin to his regiment, starting 09/08/2018.   -He was recently hospitalized for fever, diarrhea, and fatigue after last cycle chemo.  ID work-up was negative, she was treated empirically with antibiotics in the hospital.  He now has recovered very well. -He underwent CT scan in the hospital, which showed mixed response, the peritoneal lesion in the splenic helium has increased, slightly smaller other peritoneal implants.  I think his response to FOLFIRI has much decreased, but will continue for now in the lack of other effective therapy  -I previously discussed referring him to [facility] for a clinical Trial. He is agreeable. I will contact [facility] now  -He recovered well after his recent hospitalization. He denies being in pain and states that his energy and appetite have improved.  -Labs reviewed, CBC showed Hg 8.1.  -F/u next week to restart chemotherapy.     2. History of right LE DVT, and extensive LLE DVT  -I previously reviewed his ultrasound results from 02/17/17, which showed DVT involving the femoral, popliteal, and  peritoneal vein of the right lower extremity from 2/22. -He had history of right lower extremity DVT post-surgery in the past -His Xarelto has been held due to GI bleeding he has a IVC placement -No recurrence of bleeding since IVC placement.  -Pt was admitted to the hospital on 12/30/17 and discharged on 01/11/18. He presented with progressively worsening pain and swelling in the left leg. Imaging studies notable for worsening left LE DVT. Initially hesitant to start anticoagulation due to known history of GI bleed while on Xarelto. Started on Eliquis and transferred to [facility] for thrombectomy. His left leg edema resolved after thrombectomy, he was discharged with Xarelto (he refused lovenox) 36m bid -He noticed recurrent GI bleeding around 01/25/2018, resolved after holding Xarelto.  Has restarted Xarelto at 20 mg daily dose, tolerating well without bleeding. -Continue Xarelto 15 mg daily, dose reduced due to her previous severe GI bleeding. -He experienced another episode of GI bleeding on Xarelto and held for 6 days to recover before restarting. I previously advised him to hold Xarelto as needed when he experiences GI Bleeding.   3. Abdominal and knee and leg pain   -He has had left abdominal pain due to his metastasis and then back pain related to his chemo -I previously called in Neurontin 1062mfor his shooting leg pain (11/15/17).  -pain controlled overall, he is on oxycodone 1025m-6 tab daily  -He is on Neurontin at night, he can take 200 mg for the next few weeks, and I previously changed dose to 300 mg TID.  -His abdominal pain continued to worsen on Lonsurf. I stopped his Lonsurf and prescribed his MS Contin q12hours (08/19/18). - I refilled his Oxycodone q6-8hours to take as needed for breakthrough pain. 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 -He has received IV Feraheme twice,  but his anemia has not improved. He likely has component of anemia of chronic disease secondary to his underlying malignancy. -Continue ferrous Sulfate  5. Weight loss, malnutrition, anorexia -Secondary to chemotherapy and cancer -The patient will continue to drink dietary supplements. -He has gained some weight lately - I again strongly encouraged him to try to eat more to avoid losing excess weight. I again recommended he drink 2 ensure or  boost supplements a day to help. -I previously prescribed mirtazapine on 05/25/17 in attempts to help his appetite. He knows to take it once every evening. -Continue follow-up with dietitian  -On Lonsurf, his appetite continued to decrease and began loosing weight. I previously stopped Lonsurf. He will continue Mirtazapine. His appetite will likely improve once abdominal pain is controlled.   6. History of pancreatic Abscess -Presented to hospital on 07/09/17 for abdominal pain. Based on SP CT from 07/10/17 he found to have an abscess. Draining tube placed and he received prolonged course of antibiotics  -His percutaneous drainage tube has been removed  -Resolved   7. Bowel obstruction due to his colon mass at the splenic flexure and surrounding inflammation due to recent abscess, s/p diverting colostomy  -He was hospitalized on 07/29/17 for SBO, resolved  -Bowel movements overall have been normal, but sometimes he has softer stools and are more loose.  -I again reviewed the usage of both imodium and colace and in what context he should be using them both for his stool consistencies.  -He has colostomy prolapse, he knows to avoid lifting and carry heavy things   8. Diarrhea  -Second to chemotherapy -Recommend him to take Lomotil 1 to 2 tablets 3 times daily before each meals, and use Imodium 2 tablets every 6-8 hours as needed in between. This controlled his diarrhea.  -Diarrhea had increased with Lonsurf. I previously stopped Lonsurf.  9. Goal of care  discussion  -We previously discussed the incurable nature of his cancer, and the overall poor prognosis, especially if he does not have good response to chemotherapy or progress on chemo -The patient understands the goal of care is palliative. -I have previously recommended DNR/DNI, he will think about it.   Plan: -F/u next week and restart chemo FOLFIRI and Avastin  -will check with [facility] for clinical trial  -refilled morphine and oxycodone today    All questions were answered. The patient knows to call the clinic with any problems, questions or concerns.  I spent 20 minutes counseling the patient face to face. The total time spent in the appointment was 25 minutes and more than 50% was on counseling.  Dierdre Searles Dweik am acting as scribe for Dr. [name].  I have reviewed the above documentation for accuracy and completeness, and I agree with the above.     [name], MD 09/21/18

## 2018-09-21 ENCOUNTER — Inpatient Hospital Stay: Payer: Medicaid Other

## 2018-09-21 ENCOUNTER — Inpatient Hospital Stay (HOSPITAL_BASED_OUTPATIENT_CLINIC_OR_DEPARTMENT_OTHER): Payer: Medicaid Other | Admitting: Hematology

## 2018-09-21 ENCOUNTER — Encounter: Payer: Self-pay | Admitting: Hematology

## 2018-09-21 VITALS — BP 112/83 | HR 97 | Temp 99.1°F | Resp 18 | Ht 69.0 in | Wt 126.8 lb

## 2018-09-21 DIAGNOSIS — C786 Secondary malignant neoplasm of retroperitoneum and peritoneum: Secondary | ICD-10-CM | POA: Diagnosis not present

## 2018-09-21 DIAGNOSIS — C186 Malignant neoplasm of descending colon: Secondary | ICD-10-CM

## 2018-09-21 DIAGNOSIS — B370 Candidal stomatitis: Secondary | ICD-10-CM

## 2018-09-21 DIAGNOSIS — M25511 Pain in right shoulder: Secondary | ICD-10-CM

## 2018-09-21 DIAGNOSIS — Z5111 Encounter for antineoplastic chemotherapy: Secondary | ICD-10-CM | POA: Diagnosis not present

## 2018-09-21 DIAGNOSIS — K9409 Other complications of colostomy: Secondary | ICD-10-CM

## 2018-09-21 DIAGNOSIS — Z86718 Personal history of other venous thrombosis and embolism: Secondary | ICD-10-CM | POA: Diagnosis not present

## 2018-09-21 DIAGNOSIS — C189 Malignant neoplasm of colon, unspecified: Secondary | ICD-10-CM

## 2018-09-21 DIAGNOSIS — D630 Anemia in neoplastic disease: Secondary | ICD-10-CM | POA: Diagnosis not present

## 2018-09-21 DIAGNOSIS — D509 Iron deficiency anemia, unspecified: Secondary | ICD-10-CM

## 2018-09-21 DIAGNOSIS — K922 Gastrointestinal hemorrhage, unspecified: Secondary | ICD-10-CM

## 2018-09-21 DIAGNOSIS — Z7901 Long term (current) use of anticoagulants: Secondary | ICD-10-CM | POA: Diagnosis not present

## 2018-09-21 DIAGNOSIS — D500 Iron deficiency anemia secondary to blood loss (chronic): Secondary | ICD-10-CM

## 2018-09-21 DIAGNOSIS — C762 Malignant neoplasm of abdomen: Secondary | ICD-10-CM

## 2018-09-21 DIAGNOSIS — Z79899 Other long term (current) drug therapy: Secondary | ICD-10-CM

## 2018-09-21 DIAGNOSIS — G893 Neoplasm related pain (acute) (chronic): Secondary | ICD-10-CM | POA: Diagnosis not present

## 2018-09-21 DIAGNOSIS — Z95828 Presence of other vascular implants and grafts: Secondary | ICD-10-CM

## 2018-09-21 DIAGNOSIS — Z86711 Personal history of pulmonary embolism: Secondary | ICD-10-CM | POA: Diagnosis not present

## 2018-09-21 LAB — COMPREHENSIVE METABOLIC PANEL
ANION GAP: 6 (ref 5–15)
AST: 9 U/L — AB (ref 15–41)
Albumin: 2.3 g/dL — ABNORMAL LOW (ref 3.5–5.0)
Alkaline Phosphatase: 84 U/L (ref 38–126)
BUN: 4 mg/dL — ABNORMAL LOW (ref 6–20)
CO2: 27 mmol/L (ref 22–32)
Calcium: 8.8 mg/dL — ABNORMAL LOW (ref 8.9–10.3)
Chloride: 101 mmol/L (ref 98–111)
Creatinine, Ser: 0.65 mg/dL (ref 0.61–1.24)
GFR calc Af Amer: >60 mL/min (ref >60)
GLUCOSE: 100 mg/dL — AB (ref 70–99)
POTASSIUM: 4.1 mmol/L (ref 3.5–5.1)
Sodium: 134 mmol/L — ABNORMAL LOW (ref 135–145)
Total Protein: 7.3 g/dL (ref 6.5–8.1)

## 2018-09-21 LAB — CBC WITH DIFFERENTIAL/PLATELET
BASOS ABS: 0 10*3/uL (ref 0.0–0.1)
Basophils Relative: 1 %
Eosinophils Absolute: 0.1 10*3/uL (ref 0.0–0.5)
Eosinophils Relative: 2 %
HEMATOCRIT: 24.9 % — AB (ref 38.4–49.9)
Hemoglobin: 8.1 g/dL — ABNORMAL LOW (ref 13.0–17.1)
LYMPHS PCT: 25 %
Lymphs Abs: 1.2 10*3/uL (ref 0.9–3.3)
MCH: 28.3 pg (ref 27.2–33.4)
MCHC: 32.5 g/dL (ref 32.0–36.0)
MCV: 86.9 fL (ref 79.3–98.0)
MONO ABS: 0.6 10*3/uL (ref 0.1–0.9)
Monocytes Relative: 12 %
NEUTROS ABS: 3 10*3/uL (ref 1.5–6.5)
Neutrophils Relative %: 60 %
Platelets: 247 10*3/uL (ref 140–400)
RBC: 2.86 MIL/uL — AB (ref 4.20–5.82)
RDW: 16.9 % — ABNORMAL HIGH (ref 11.0–14.6)
WBC: 4.9 10*3/uL (ref 4.0–10.3)

## 2018-09-21 LAB — TOTAL PROTEIN, URINE DIPSTICK: Protein, ur: NEGATIVE mg/dL

## 2018-09-21 MED ORDER — HEPARIN SOD (PORK) LOCK FLUSH 100 UNIT/ML IV SOLN
500.0000 [IU] | INTRAVENOUS | Status: DC | PRN
  Administered 2018-09-21: 500 [IU] via INTRAVENOUS
  Filled 2018-09-21: qty 5

## 2018-09-21 MED ORDER — MORPHINE SULFATE ER 15 MG PO TBCR: 15.0000 mg | EXTENDED_RELEASE_TABLET | Freq: Two times a day (BID) | ORAL | 0 refills | Status: DC

## 2018-09-21 MED ORDER — OXYCODONE HCL 10 MG PO TABS: 10.0000 mg | ORAL_TABLET | ORAL | 0 refills | Status: DC | PRN

## 2018-09-21 MED ORDER — SODIUM CHLORIDE 0.9% FLUSH
10.0000 mL | INTRAVENOUS | Status: DC | PRN
  Administered 2018-09-21: 10 mL via INTRAVENOUS
  Filled 2018-09-21: qty 10

## 2018-09-21 MED FILL — MORPHINE SULF ER 15 MG TAB: 15 | 30 days supply | Qty: 60 | Fill #0

## 2018-09-21 MED FILL — oxyCODONE HCL 10 MG TABS: 10 | 15 days supply | Qty: 90 | Fill #0

## 2018-09-23 ENCOUNTER — Telehealth: Payer: Self-pay | Admitting: Hematology

## 2018-09-23 NOTE — Telephone Encounter (Signed)
Scheduled appt per 9/25 los - left message for patient with appt date and time.

## 2018-09-24 ENCOUNTER — Other Ambulatory Visit: Payer: Self-pay | Admitting: Hematology

## 2018-09-24 DIAGNOSIS — D500 Iron deficiency anemia secondary to blood loss (chronic): Secondary | ICD-10-CM

## 2018-09-26 ENCOUNTER — Telehealth: Payer: Self-pay | Admitting: Hematology

## 2018-09-26 NOTE — Telephone Encounter (Signed)
Left message re 10/1 lab delay and asked that patient still arrive at 7:45 am for check in.

## 2018-09-27 ENCOUNTER — Telehealth: Payer: Self-pay | Admitting: Hematology

## 2018-09-27 ENCOUNTER — Inpatient Hospital Stay: Payer: Medicaid Other

## 2018-09-27 ENCOUNTER — Inpatient Hospital Stay: Payer: Medicaid Other | Attending: Hematology | Admitting: Nurse Practitioner

## 2018-09-27 ENCOUNTER — Encounter: Payer: Self-pay | Admitting: Nurse Practitioner

## 2018-09-27 VITALS — BP 120/85 | HR 92 | Temp 97.8°F | Resp 18 | Ht 69.0 in | Wt 122.5 lb

## 2018-09-27 VITALS — BP 107/76 | HR 86

## 2018-09-27 DIAGNOSIS — K56609 Unspecified intestinal obstruction, unspecified as to partial versus complete obstruction: Secondary | ICD-10-CM

## 2018-09-27 DIAGNOSIS — Z5111 Encounter for antineoplastic chemotherapy: Secondary | ICD-10-CM | POA: Diagnosis present

## 2018-09-27 DIAGNOSIS — C186 Malignant neoplasm of descending colon: Secondary | ICD-10-CM | POA: Diagnosis not present

## 2018-09-27 DIAGNOSIS — D500 Iron deficiency anemia secondary to blood loss (chronic): Secondary | ICD-10-CM

## 2018-09-27 DIAGNOSIS — Z933 Colostomy status: Secondary | ICD-10-CM

## 2018-09-27 DIAGNOSIS — Z79899 Other long term (current) drug therapy: Secondary | ICD-10-CM | POA: Diagnosis not present

## 2018-09-27 DIAGNOSIS — D630 Anemia in neoplastic disease: Secondary | ICD-10-CM

## 2018-09-27 DIAGNOSIS — Z86718 Personal history of other venous thrombosis and embolism: Secondary | ICD-10-CM | POA: Diagnosis not present

## 2018-09-27 DIAGNOSIS — C786 Secondary malignant neoplasm of retroperitoneum and peritoneum: Secondary | ICD-10-CM

## 2018-09-27 DIAGNOSIS — C762 Malignant neoplasm of abdomen: Secondary | ICD-10-CM

## 2018-09-27 DIAGNOSIS — E46 Unspecified protein-calorie malnutrition: Secondary | ICD-10-CM | POA: Diagnosis not present

## 2018-09-27 DIAGNOSIS — C189 Malignant neoplasm of colon, unspecified: Secondary | ICD-10-CM

## 2018-09-27 DIAGNOSIS — Z95828 Presence of other vascular implants and grafts: Secondary | ICD-10-CM

## 2018-09-27 LAB — COMPREHENSIVE METABOLIC PANEL
ALBUMIN: 2.5 g/dL — AB (ref 3.5–5.0)
ALT: 6 U/L (ref 0–44)
ANION GAP: 11 (ref 5–15)
AST: 12 U/L — ABNORMAL LOW (ref 15–41)
Alkaline Phosphatase: 90 U/L (ref 38–126)
BUN: 3 mg/dL — ABNORMAL LOW (ref 6–20)
CALCIUM: 9.1 mg/dL (ref 8.9–10.3)
CO2: 27 mmol/L (ref 22–32)
Chloride: 101 mmol/L (ref 98–111)
Creatinine, Ser: 0.77 mg/dL (ref 0.61–1.24)
GFR calc non Af Amer: >60 mL/min (ref >60)
GLUCOSE: 131 mg/dL — AB (ref 70–99)
POTASSIUM: 3.7 mmol/L (ref 3.5–5.1)
SODIUM: 139 mmol/L (ref 135–145)
TOTAL PROTEIN: 8.1 g/dL (ref 6.5–8.1)

## 2018-09-27 LAB — CBC WITH DIFFERENTIAL/PLATELET
BASOS PCT: 1 %
Basophils Absolute: 0.1 10*3/uL (ref 0.0–0.1)
EOS ABS: 0.2 10*3/uL (ref 0.0–0.5)
Eosinophils Relative: 3 %
HCT: 26.6 % — ABNORMAL LOW (ref 38.4–49.9)
Hemoglobin: 8.6 g/dL — ABNORMAL LOW (ref 13.0–17.1)
LYMPHS ABS: 1.6 10*3/uL (ref 0.9–3.3)
Lymphocytes Relative: 30 %
MCH: 28.1 pg (ref 27.2–33.4)
MCHC: 32.3 g/dL (ref 32.0–36.0)
MCV: 86.9 fL (ref 79.3–98.0)
MONO ABS: 0.6 10*3/uL (ref 0.1–0.9)
MONOS PCT: 11 %
Neutro Abs: 2.9 10*3/uL (ref 1.5–6.5)
Neutrophils Relative %: 55 %
Platelets: 263 10*3/uL (ref 140–400)
RBC: 3.07 MIL/uL — ABNORMAL LOW (ref 4.20–5.82)
RDW: 17.3 % — AB (ref 11.0–14.6)
WBC: 5.3 10*3/uL (ref 4.0–10.3)

## 2018-09-27 LAB — SAMPLE TO BLOOD BANK

## 2018-09-27 MED ORDER — DEXAMETHASONE SODIUM PHOSPHATE 10 MG/ML IJ SOLN
INTRAMUSCULAR | Status: AC
  Filled 2018-09-27: qty 1

## 2018-09-27 MED ORDER — SODIUM CHLORIDE 0.9 % IV SOLN
2200.0000 mg/m2 | INTRAVENOUS | Status: DC
  Administered 2018-09-27: 3650 mg via INTRAVENOUS
  Filled 2018-09-27: qty 73

## 2018-09-27 MED ORDER — DEXAMETHASONE SODIUM PHOSPHATE 10 MG/ML IJ SOLN
10.0000 mg | Freq: Once | INTRAMUSCULAR | Status: AC
  Administered 2018-09-27: 10 mg via INTRAVENOUS

## 2018-09-27 MED ORDER — GABAPENTIN 300 MG PO CAPS: 300.0000 mg | ORAL_CAPSULE | Freq: Three times a day (TID) | ORAL | 2 refills | Status: DC

## 2018-09-27 MED ORDER — SODIUM CHLORIDE 0.9 % IV SOLN
Freq: Once | INTRAVENOUS | Status: AC
  Administered 2018-09-27: 10:00:00 via INTRAVENOUS
  Filled 2018-09-27: qty 250

## 2018-09-27 MED ORDER — PALONOSETRON HCL INJECTION 0.25 MG/5ML
0.2500 mg | Freq: Once | INTRAVENOUS | Status: AC
  Administered 2018-09-27: 0.25 mg via INTRAVENOUS

## 2018-09-27 MED ORDER — ATROPINE SULFATE 1 MG/ML IJ SOLN
0.5000 mg | Freq: Once | INTRAMUSCULAR | Status: AC | PRN
  Administered 2018-09-27: 0.5 mg via INTRAVENOUS

## 2018-09-27 MED ORDER — HEPARIN SOD (PORK) LOCK FLUSH 100 UNIT/ML IV SOLN
500.0000 [IU] | Freq: Once | INTRAVENOUS | Status: DC | PRN
  Filled 2018-09-27: qty 5

## 2018-09-27 MED ORDER — IRINOTECAN HCL CHEMO INJECTION 100 MG/5ML
120.0000 mg/m2 | Freq: Once | INTRAVENOUS | Status: AC
  Administered 2018-09-27: 200 mg via INTRAVENOUS
  Filled 2018-09-27: qty 10

## 2018-09-27 MED ORDER — PALONOSETRON HCL INJECTION 0.25 MG/5ML
INTRAVENOUS | Status: AC
  Filled 2018-09-27: qty 5

## 2018-09-27 MED ORDER — SODIUM CHLORIDE 0.9% FLUSH
10.0000 mL | INTRAVENOUS | Status: DC | PRN
  Administered 2018-09-27: 10 mL via INTRAVENOUS
  Filled 2018-09-27: qty 10

## 2018-09-27 MED ORDER — SODIUM CHLORIDE 0.9% FLUSH
10.0000 mL | INTRAVENOUS | Status: DC | PRN
  Administered 2018-09-27: 10 mL
  Filled 2018-09-27: qty 10

## 2018-09-27 MED ORDER — LEUCOVORIN CALCIUM INJECTION 350 MG
400.0000 mg/m2 | Freq: Once | INTRAVENOUS | Status: AC
  Administered 2018-09-27: 660 mg via INTRAVENOUS
  Filled 2018-09-27: qty 33

## 2018-09-27 MED ORDER — ATROPINE SULFATE 1 MG/ML IJ SOLN
INTRAMUSCULAR | Status: AC
  Filled 2018-09-27: qty 1

## 2018-09-27 MED ORDER — SODIUM CHLORIDE 0.9 % IV SOLN
5.4000 mg/kg | Freq: Once | INTRAVENOUS | Status: AC
  Administered 2018-09-27: 300 mg via INTRAVENOUS
  Filled 2018-09-27: qty 12

## 2018-09-27 MED FILL — GABAPENTIN 300 MG CAPSULE: 300 | 30 days supply | Qty: 90 | Fill #0
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
## 2018-09-27 NOTE — Patient Instructions (Signed)
[facility] Discharge Instructions for Patients Receiving Chemotherapy  Today you received the following chemotherapy agents bevacizumab, leucovorin, irinotecan, and fluorouracil.  To help prevent nausea and vomiting after your treatment, we encourage you to take your nausea medication as directed by your doctor.   If you develop nausea and vomiting that is not controlled by your nausea medication, call the clinic.   BELOW ARE SYMPTOMS THAT SHOULD BE REPORTED IMMEDIATELY:  *FEVER GREATER THAN 100.5 F  *CHILLS WITH OR WITHOUT FEVER  NAUSEA AND VOMITING THAT IS NOT CONTROLLED WITH YOUR NAUSEA MEDICATION  *UNUSUAL SHORTNESS OF BREATH  *UNUSUAL BRUISING OR BLEEDING  TENDERNESS IN MOUTH AND THROAT WITH OR WITHOUT PRESENCE OF ULCERS  *URINARY PROBLEMS  *BOWEL PROBLEMS  UNUSUAL RASH Items with * indicate a potential emergency and should be followed up as soon as possible.  Feel free to call the clinic should you have any questions or concerns. The clinic phone number is [phone].  Please show the [place] at check-in to the Emergency Department and triage nurse.

## 2018-09-27 NOTE — Telephone Encounter (Signed)
Appts scheduled AVS/Calendar printed per 10/1 los °

## 2018-09-29 ENCOUNTER — Inpatient Hospital Stay: Payer: Medicaid Other

## 2018-09-29 VITALS — BP 115/75 | HR 88 | Temp 98.1°F

## 2018-09-29 DIAGNOSIS — C189 Malignant neoplasm of colon, unspecified: Secondary | ICD-10-CM

## 2018-09-29 DIAGNOSIS — Z5111 Encounter for antineoplastic chemotherapy: Secondary | ICD-10-CM | POA: Diagnosis not present

## 2018-09-29 MED ORDER — SODIUM CHLORIDE 0.9% FLUSH
10.0000 mL | INTRAVENOUS | Status: DC | PRN
  Administered 2018-09-29: 10 mL
  Filled 2018-09-29: qty 10

## 2018-09-29 MED ORDER — HEPARIN SOD (PORK) LOCK FLUSH 100 UNIT/ML IV SOLN
500.0000 [IU] | Freq: Once | INTRAVENOUS | Status: AC | PRN
  Administered 2018-09-29: 500 [IU]
  Filled 2018-09-29: qty 5

## 2018-10-10 ENCOUNTER — Encounter: Payer: Self-pay | Admitting: Nurse Practitioner

## 2018-10-10 ENCOUNTER — Inpatient Hospital Stay: Payer: Medicaid Other

## 2018-10-10 ENCOUNTER — Inpatient Hospital Stay (HOSPITAL_BASED_OUTPATIENT_CLINIC_OR_DEPARTMENT_OTHER): Payer: Medicaid Other | Admitting: Nurse Practitioner

## 2018-10-10 VITALS — BP 104/77 | HR 92 | Temp 98.7°F | Resp 19 | Ht 69.0 in | Wt 117.3 lb

## 2018-10-10 DIAGNOSIS — K56609 Unspecified intestinal obstruction, unspecified as to partial versus complete obstruction: Secondary | ICD-10-CM | POA: Diagnosis not present

## 2018-10-10 DIAGNOSIS — Z79899 Other long term (current) drug therapy: Secondary | ICD-10-CM

## 2018-10-10 DIAGNOSIS — Z933 Colostomy status: Secondary | ICD-10-CM

## 2018-10-10 DIAGNOSIS — C186 Malignant neoplasm of descending colon: Secondary | ICD-10-CM | POA: Diagnosis not present

## 2018-10-10 DIAGNOSIS — R066 Hiccough: Secondary | ICD-10-CM

## 2018-10-10 DIAGNOSIS — C189 Malignant neoplasm of colon, unspecified: Secondary | ICD-10-CM

## 2018-10-10 DIAGNOSIS — C786 Secondary malignant neoplasm of retroperitoneum and peritoneum: Secondary | ICD-10-CM | POA: Diagnosis not present

## 2018-10-10 DIAGNOSIS — E46 Unspecified protein-calorie malnutrition: Secondary | ICD-10-CM

## 2018-10-10 DIAGNOSIS — Z86718 Personal history of other venous thrombosis and embolism: Secondary | ICD-10-CM | POA: Diagnosis not present

## 2018-10-10 DIAGNOSIS — D500 Iron deficiency anemia secondary to blood loss (chronic): Secondary | ICD-10-CM

## 2018-10-10 DIAGNOSIS — D630 Anemia in neoplastic disease: Secondary | ICD-10-CM

## 2018-10-10 DIAGNOSIS — Z95828 Presence of other vascular implants and grafts: Secondary | ICD-10-CM

## 2018-10-10 DIAGNOSIS — Z5111 Encounter for antineoplastic chemotherapy: Secondary | ICD-10-CM | POA: Diagnosis not present

## 2018-10-10 DIAGNOSIS — C762 Malignant neoplasm of abdomen: Secondary | ICD-10-CM

## 2018-10-10 LAB — COMPREHENSIVE METABOLIC PANEL
ALT: 6 U/L (ref 0–44)
ANION GAP: 8 (ref 5–15)
AST: 18 U/L (ref 15–41)
Albumin: 3.2 g/dL — ABNORMAL LOW (ref 3.5–5.0)
Alkaline Phosphatase: 82 U/L (ref 38–126)
BUN: 9 mg/dL (ref 6–20)
CHLORIDE: 105 mmol/L (ref 98–111)
CO2: 27 mmol/L (ref 22–32)
CREATININE: 0.79 mg/dL (ref 0.61–1.24)
Calcium: 9.3 mg/dL (ref 8.9–10.3)
Glucose, Bld: 118 mg/dL — ABNORMAL HIGH (ref 70–99)
Potassium: 4 mmol/L (ref 3.5–5.1)
SODIUM: 140 mmol/L (ref 135–145)
Total Bilirubin: 0.3 mg/dL (ref 0.3–1.2)
Total Protein: 7.7 g/dL (ref 6.5–8.1)

## 2018-10-10 LAB — CBC WITH DIFFERENTIAL/PLATELET
Abs Immature Granulocytes: 0.03 10*3/uL (ref 0.00–0.07)
BASOS PCT: 1 %
Basophils Absolute: 0.1 10*3/uL (ref 0.0–0.1)
EOS ABS: 0.1 10*3/uL (ref 0.0–0.5)
EOS PCT: 1 %
HCT: 31 % — ABNORMAL LOW (ref 39.0–52.0)
Hemoglobin: 9.7 g/dL — ABNORMAL LOW (ref 13.0–17.0)
Immature Granulocytes: 0 %
Lymphocytes Relative: 22 %
Lymphs Abs: 1.9 10*3/uL (ref 0.7–4.0)
MCH: 27.5 pg (ref 26.0–34.0)
MCHC: 31.3 g/dL (ref 30.0–36.0)
MCV: 87.8 fL (ref 80.0–100.0)
MONO ABS: 0.7 10*3/uL (ref 0.1–1.0)
MONOS PCT: 8 %
Neutro Abs: 5.8 10*3/uL (ref 1.7–7.7)
Neutrophils Relative %: 68 %
PLATELETS: 194 10*3/uL (ref 150–400)
RBC: 3.53 MIL/uL — AB (ref 4.22–5.81)
RDW: 18.8 % — AB (ref 11.5–15.5)
WBC: 8.5 10*3/uL (ref 4.0–10.5)
nRBC: 0 % (ref 0.0–0.2)

## 2018-10-10 LAB — TOTAL PROTEIN, URINE DIPSTICK: PROTEIN: NEGATIVE mg/dL

## 2018-10-10 MED ORDER — ATROPINE SULFATE 1 MG/ML IJ SOLN
INTRAMUSCULAR | Status: AC
  Filled 2018-10-10: qty 1

## 2018-10-10 MED ORDER — DEXAMETHASONE SODIUM PHOSPHATE 10 MG/ML IJ SOLN
10.0000 mg | Freq: Once | INTRAMUSCULAR | Status: AC
  Administered 2018-10-10: 10 mg via INTRAVENOUS

## 2018-10-10 MED ORDER — PALONOSETRON HCL INJECTION 0.25 MG/5ML
INTRAVENOUS | Status: AC
  Filled 2018-10-10: qty 5

## 2018-10-10 MED ORDER — LEUCOVORIN CALCIUM INJECTION 350 MG
400.0000 mg/m2 | Freq: Once | INTRAVENOUS | Status: AC
  Administered 2018-10-10: 660 mg via INTRAVENOUS
  Filled 2018-10-10: qty 33

## 2018-10-10 MED ORDER — SODIUM CHLORIDE 0.9 % IV SOLN
Freq: Once | INTRAVENOUS | Status: AC
  Administered 2018-10-10: 13:00:00 via INTRAVENOUS
  Filled 2018-10-10: qty 250

## 2018-10-10 MED ORDER — IRINOTECAN HCL CHEMO INJECTION 100 MG/5ML
120.0000 mg/m2 | Freq: Once | INTRAVENOUS | Status: AC
  Administered 2018-10-10: 200 mg via INTRAVENOUS
  Filled 2018-10-10: qty 10

## 2018-10-10 MED ORDER — PALONOSETRON HCL INJECTION 0.25 MG/5ML
0.2500 mg | Freq: Once | INTRAVENOUS | Status: AC
  Administered 2018-10-10: 0.25 mg via INTRAVENOUS

## 2018-10-10 MED ORDER — ATROPINE SULFATE 1 MG/ML IJ SOLN
0.5000 mg | Freq: Once | INTRAMUSCULAR | Status: AC | PRN
  Administered 2018-10-10: 0.5 mg via INTRAVENOUS

## 2018-10-10 MED ORDER — HEPARIN SOD (PORK) LOCK FLUSH 100 UNIT/ML IV SOLN
500.0000 [IU] | INTRAVENOUS | Status: DC | PRN
  Filled 2018-10-10: qty 5

## 2018-10-10 MED ORDER — SODIUM CHLORIDE 0.9% FLUSH
10.0000 mL | INTRAVENOUS | Status: DC | PRN
  Administered 2018-10-10: 10 mL via INTRAVENOUS
  Filled 2018-10-10: qty 10

## 2018-10-10 MED ORDER — DEXAMETHASONE SODIUM PHOSPHATE 10 MG/ML IJ SOLN
INTRAMUSCULAR | Status: AC
  Filled 2018-10-10: qty 1

## 2018-10-10 MED ORDER — SODIUM CHLORIDE 0.9 % IV SOLN
Freq: Once | INTRAVENOUS | Status: AC
  Administered 2018-10-10: 14:00:00 via INTRAVENOUS
  Filled 2018-10-10: qty 250

## 2018-10-10 MED ORDER — CHLORPROMAZINE HCL 10 MG PO TABS: 10.0000 mg | ORAL_TABLET | Freq: Three times a day (TID) | ORAL | 1 refills | Status: DC | PRN

## 2018-10-10 MED ORDER — SODIUM CHLORIDE 0.9 % IV SOLN
2200.0000 mg/m2 | INTRAVENOUS | Status: DC
  Administered 2018-10-10: 3650 mg via INTRAVENOUS
  Filled 2018-10-10: qty 73

## 2018-10-10 MED FILL — CHLORPROMAZINE 10 MG TABLET: 10 | 15 days supply | Qty: 45 | Fill #0
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
## 2018-10-10 NOTE — Patient Instructions (Signed)
[place] Discharge Instructions for Patients Receiving Chemotherapy  Today you received the following chemotherapy agents leucovorin, irinotecan, and fluorouracil.  To help prevent nausea and vomiting after your treatment, we encourage you to take your nausea medication as directed by your doctor.   If you develop nausea and vomiting that is not controlled by your nausea medication, call the clinic.   BELOW ARE SYMPTOMS THAT SHOULD BE REPORTED IMMEDIATELY:  *FEVER GREATER THAN 100.5 F  *CHILLS WITH OR WITHOUT FEVER  NAUSEA AND VOMITING THAT IS NOT CONTROLLED WITH YOUR NAUSEA MEDICATION  *UNUSUAL SHORTNESS OF BREATH  *UNUSUAL BRUISING OR BLEEDING  TENDERNESS IN MOUTH AND THROAT WITH OR WITHOUT PRESENCE OF ULCERS  *URINARY PROBLEMS  *BOWEL PROBLEMS  UNUSUAL RASH Items with * indicate a potential emergency and should be followed up as soon as possible.  Feel free to call the clinic should you have any questions or concerns. The clinic phone number is [phone].  Please show the [place] at check-in to the Emergency Department and triage nurse.

## 2018-10-11 LAB — FERRITIN: Ferritin: 430 ng/mL — ABNORMAL HIGH (ref 24–336)

## 2018-10-11 LAB — CEA (IN HOUSE-CHCC): CEA (CHCC-IN HOUSE): 16.08 ng/mL — AB (ref 0.00–5.00)

## 2018-10-12 ENCOUNTER — Inpatient Hospital Stay: Payer: Medicaid Other

## 2018-10-12 VITALS — BP 101/82 | HR 112 | Temp 98.3°F | Resp 18

## 2018-10-12 DIAGNOSIS — C189 Malignant neoplasm of colon, unspecified: Secondary | ICD-10-CM

## 2018-10-12 DIAGNOSIS — Z5111 Encounter for antineoplastic chemotherapy: Secondary | ICD-10-CM | POA: Diagnosis not present

## 2018-10-12 MED ORDER — SODIUM CHLORIDE 0.9% FLUSH
10.0000 mL | INTRAVENOUS | Status: DC | PRN
  Administered 2018-10-12: 10 mL
  Filled 2018-10-12: qty 10

## 2018-10-12 MED ORDER — HEPARIN SOD (PORK) LOCK FLUSH 100 UNIT/ML IV SOLN
500.0000 [IU] | Freq: Once | INTRAVENOUS | Status: AC | PRN
  Administered 2018-10-12: 500 [IU]
  Filled 2018-10-12: qty 5

## 2018-10-14 ENCOUNTER — Other Ambulatory Visit: Payer: Self-pay | Admitting: Nurse Practitioner

## 2018-10-14 ENCOUNTER — Other Ambulatory Visit: Payer: Self-pay | Admitting: Hematology

## 2018-10-14 DIAGNOSIS — C189 Malignant neoplasm of colon, unspecified: Secondary | ICD-10-CM

## 2018-10-14 MED ORDER — OXYCODONE HCL 10 MG PO TABS: 10.0000 mg | ORAL_TABLET | ORAL | 0 refills | Status: DC | PRN

## 2018-10-14 MED ORDER — DIPHENOXYLATE-ATROPINE 2.5-0.025 MG PO TABS: 2.0000 | ORAL_TABLET | Freq: Four times a day (QID) | ORAL | 1 refills | Status: DC | PRN

## 2018-10-14 MED FILL — DIPHENOXYLATE-ATROPINE 2.5-: 2.5-0.025 | 12 days supply | Qty: 90 | Fill #1

## 2018-10-14 MED FILL — oxyCODONE HCL 10 MG TABS: 10 | 15 days supply | Qty: 90 | Fill #0

## 2018-10-26 ENCOUNTER — Inpatient Hospital Stay: Payer: Medicaid Other

## 2018-10-26 ENCOUNTER — Telehealth: Payer: Self-pay | Admitting: Hematology

## 2018-10-26 ENCOUNTER — Encounter: Payer: Self-pay | Admitting: Nurse Practitioner

## 2018-10-26 ENCOUNTER — Inpatient Hospital Stay (HOSPITAL_BASED_OUTPATIENT_CLINIC_OR_DEPARTMENT_OTHER): Payer: Medicaid Other | Admitting: Nurse Practitioner

## 2018-10-26 VITALS — BP 117/80 | HR 105 | Temp 98.3°F | Resp 17 | Ht 69.0 in | Wt 121.1 lb

## 2018-10-26 VITALS — HR 101

## 2018-10-26 DIAGNOSIS — Z86718 Personal history of other venous thrombosis and embolism: Secondary | ICD-10-CM | POA: Diagnosis not present

## 2018-10-26 DIAGNOSIS — D500 Iron deficiency anemia secondary to blood loss (chronic): Secondary | ICD-10-CM

## 2018-10-26 DIAGNOSIS — Z933 Colostomy status: Secondary | ICD-10-CM

## 2018-10-26 DIAGNOSIS — K56609 Unspecified intestinal obstruction, unspecified as to partial versus complete obstruction: Secondary | ICD-10-CM

## 2018-10-26 DIAGNOSIS — C189 Malignant neoplasm of colon, unspecified: Secondary | ICD-10-CM

## 2018-10-26 DIAGNOSIS — E46 Unspecified protein-calorie malnutrition: Secondary | ICD-10-CM

## 2018-10-26 DIAGNOSIS — C186 Malignant neoplasm of descending colon: Secondary | ICD-10-CM

## 2018-10-26 DIAGNOSIS — Z5111 Encounter for antineoplastic chemotherapy: Secondary | ICD-10-CM | POA: Diagnosis not present

## 2018-10-26 DIAGNOSIS — C762 Malignant neoplasm of abdomen: Secondary | ICD-10-CM

## 2018-10-26 DIAGNOSIS — C786 Secondary malignant neoplasm of retroperitoneum and peritoneum: Secondary | ICD-10-CM | POA: Diagnosis not present

## 2018-10-26 DIAGNOSIS — Z79899 Other long term (current) drug therapy: Secondary | ICD-10-CM

## 2018-10-26 DIAGNOSIS — Z95828 Presence of other vascular implants and grafts: Secondary | ICD-10-CM

## 2018-10-26 LAB — CBC WITH DIFFERENTIAL/PLATELET
Abs Immature Granulocytes: 0.01 10*3/uL (ref 0.00–0.07)
Basophils Absolute: 0 10*3/uL (ref 0.0–0.1)
Basophils Relative: 1 %
EOS ABS: 0.2 10*3/uL (ref 0.0–0.5)
EOS PCT: 4 %
HCT: 30.9 % — ABNORMAL LOW (ref 39.0–52.0)
HEMOGLOBIN: 9.7 g/dL — AB (ref 13.0–17.0)
Immature Granulocytes: 0 %
LYMPHS PCT: 38 %
Lymphs Abs: 1.6 10*3/uL (ref 0.7–4.0)
MCH: 27.2 pg (ref 26.0–34.0)
MCHC: 31.4 g/dL (ref 30.0–36.0)
MCV: 86.8 fL (ref 80.0–100.0)
MONO ABS: 0.4 10*3/uL (ref 0.1–1.0)
Monocytes Relative: 9 %
Neutro Abs: 2 10*3/uL (ref 1.7–7.7)
Neutrophils Relative %: 48 %
Platelets: 129 10*3/uL — ABNORMAL LOW (ref 150–400)
RBC: 3.56 MIL/uL — ABNORMAL LOW (ref 4.22–5.81)
RDW: 19.2 % — AB (ref 11.5–15.5)
WBC: 4.3 10*3/uL (ref 4.0–10.5)
nRBC: 0 % (ref 0.0–0.2)

## 2018-10-26 LAB — COMPREHENSIVE METABOLIC PANEL
ALBUMIN: 3.1 g/dL — AB (ref 3.5–5.0)
ALT: <6 U/L (ref 0–44)
AST: 17 U/L (ref 15–41)
Alkaline Phosphatase: 87 U/L (ref 38–126)
Anion gap: 11 (ref 5–15)
BUN: 6 mg/dL (ref 6–20)
CALCIUM: 8.9 mg/dL (ref 8.9–10.3)
CO2: 26 mmol/L (ref 22–32)
Chloride: 108 mmol/L (ref 98–111)
Creatinine, Ser: 0.78 mg/dL (ref 0.61–1.24)
GFR calc Af Amer: >60 mL/min (ref >60)
GFR calc non Af Amer: >60 mL/min (ref >60)
GLUCOSE: 96 mg/dL (ref 70–99)
POTASSIUM: 3.8 mmol/L (ref 3.5–5.1)
SODIUM: 145 mmol/L (ref 135–145)
Total Bilirubin: 0.2 mg/dL — ABNORMAL LOW (ref 0.3–1.2)
Total Protein: 7.6 g/dL (ref 6.5–8.1)

## 2018-10-26 MED ORDER — SODIUM CHLORIDE 0.9% FLUSH
10.0000 mL | INTRAVENOUS | Status: DC | PRN
  Administered 2018-10-26: 10 mL via INTRAVENOUS
  Filled 2018-10-26: qty 10

## 2018-10-26 MED ORDER — ATROPINE SULFATE 1 MG/ML IJ SOLN
0.5000 mg | Freq: Once | INTRAMUSCULAR | Status: AC | PRN
  Administered 2018-10-26: 0.5 mg via INTRAVENOUS

## 2018-10-26 MED ORDER — PALONOSETRON HCL INJECTION 0.25 MG/5ML
INTRAVENOUS | Status: AC
  Filled 2018-10-26: qty 5

## 2018-10-26 MED ORDER — SODIUM CHLORIDE 0.9 % IV SOLN
2200.0000 mg/m2 | INTRAVENOUS | Status: DC
  Administered 2018-10-26: 3650 mg via INTRAVENOUS
  Filled 2018-10-26: qty 73

## 2018-10-26 MED ORDER — SODIUM CHLORIDE 0.9 % IV SOLN
Freq: Once | INTRAVENOUS | Status: AC
  Administered 2018-10-26: 09:00:00 via INTRAVENOUS
  Filled 2018-10-26: qty 250

## 2018-10-26 MED ORDER — DEXAMETHASONE SODIUM PHOSPHATE 10 MG/ML IJ SOLN
INTRAMUSCULAR | Status: AC
  Filled 2018-10-26: qty 1

## 2018-10-26 MED ORDER — LEUCOVORIN CALCIUM INJECTION 350 MG
400.0000 mg/m2 | Freq: Once | INTRAVENOUS | Status: AC
  Administered 2018-10-26: 660 mg via INTRAVENOUS
  Filled 2018-10-26: qty 33

## 2018-10-26 MED ORDER — ATROPINE SULFATE 1 MG/ML IJ SOLN
INTRAMUSCULAR | Status: AC
  Filled 2018-10-26: qty 1

## 2018-10-26 MED ORDER — PALONOSETRON HCL INJECTION 0.25 MG/5ML
0.2500 mg | Freq: Once | INTRAVENOUS | Status: AC
  Administered 2018-10-26: 0.25 mg via INTRAVENOUS

## 2018-10-26 MED ORDER — DEXAMETHASONE SODIUM PHOSPHATE 10 MG/ML IJ SOLN
10.0000 mg | Freq: Once | INTRAMUSCULAR | Status: AC
  Administered 2018-10-26: 10 mg via INTRAVENOUS

## 2018-10-26 MED ORDER — MORPHINE SULFATE ER 15 MG PO TBCR: 15.0000 mg | EXTENDED_RELEASE_TABLET | Freq: Two times a day (BID) | ORAL | 0 refills | Status: DC

## 2018-10-26 MED ORDER — SODIUM CHLORIDE 0.9% FLUSH
10.0000 mL | INTRAVENOUS | Status: DC | PRN
  Filled 2018-10-26: qty 10

## 2018-10-26 MED ORDER — OXYCODONE HCL 10 MG PO TABS: 10.0000 mg | ORAL_TABLET | ORAL | 0 refills | Status: DC | PRN

## 2018-10-26 MED ORDER — IRINOTECAN HCL CHEMO INJECTION 100 MG/5ML
120.0000 mg/m2 | Freq: Once | INTRAVENOUS | Status: AC
  Administered 2018-10-26: 200 mg via INTRAVENOUS
  Filled 2018-10-26: qty 10

## 2018-10-26 MED ORDER — HEPARIN SOD (PORK) LOCK FLUSH 100 UNIT/ML IV SOLN
500.0000 [IU] | Freq: Once | INTRAVENOUS | Status: DC | PRN
  Filled 2018-10-26: qty 5

## 2018-10-26 MED ORDER — SODIUM CHLORIDE 0.9 % IV SOLN
5.4500 mg/kg | Freq: Once | INTRAVENOUS | Status: AC
  Administered 2018-10-26: 300 mg via INTRAVENOUS
  Filled 2018-10-26: qty 12

## 2018-10-26 MED FILL — MORPHINE SULF ER 15 MG TAB: 15 | 30 days supply | Qty: 60 | Fill #0

## 2018-10-26 NOTE — Telephone Encounter (Signed)
No LOS 10/30

## 2018-10-26 NOTE — Progress Notes (Signed)
Ok to treat HR 101 per MD Burr Medico

## 2018-10-26 NOTE — Patient Instructions (Signed)
[facility] Discharge Instructions for Patients Receiving Chemotherapy  Today you received the following chemotherapy agents: Avastin, Irinotecan, Leucovorin and Adrucil   To help prevent nausea and vomiting after your treatment, we encourage you to take your nausea medication as directed.    If you develop nausea and vomiting that is not controlled by your nausea medication, call the clinic.   BELOW ARE SYMPTOMS THAT SHOULD BE REPORTED IMMEDIATELY:  *FEVER GREATER THAN 100.5 F  *CHILLS WITH OR WITHOUT FEVER  NAUSEA AND VOMITING THAT IS NOT CONTROLLED WITH YOUR NAUSEA MEDICATION  *UNUSUAL SHORTNESS OF BREATH  *UNUSUAL BRUISING OR BLEEDING  TENDERNESS IN MOUTH AND THROAT WITH OR WITHOUT PRESENCE OF ULCERS  *URINARY PROBLEMS  *BOWEL PROBLEMS  UNUSUAL RASH Items with * indicate a potential emergency and should be followed up as soon as possible.  Feel free to call the clinic should you have any questions or concerns. The clinic phone number is [phone].  Please show the CHEMO ALERT CARD at check-in to the Emergency Department and triage nurse.   
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
## 2018-10-27 MED FILL — POTASSIUM CL ER 20 MEQ TABL: 20 | 30 days supply | Qty: 30 | Fill #1

## 2018-10-27 MED FILL — XARELTO 15 MG TABLET: 15 | 30 days supply | Qty: 30 | Fill #1

## 2018-10-28 ENCOUNTER — Inpatient Hospital Stay: Payer: Medicaid Other | Attending: Hematology

## 2018-10-28 VITALS — BP 123/98 | HR 86 | Temp 97.9°F | Resp 16

## 2018-10-28 DIAGNOSIS — C186 Malignant neoplasm of descending colon: Secondary | ICD-10-CM | POA: Diagnosis not present

## 2018-10-28 DIAGNOSIS — Z7901 Long term (current) use of anticoagulants: Secondary | ICD-10-CM | POA: Diagnosis not present

## 2018-10-28 DIAGNOSIS — D630 Anemia in neoplastic disease: Secondary | ICD-10-CM | POA: Insufficient documentation

## 2018-10-28 DIAGNOSIS — Z933 Colostomy status: Secondary | ICD-10-CM | POA: Insufficient documentation

## 2018-10-28 DIAGNOSIS — C189 Malignant neoplasm of colon, unspecified: Secondary | ICD-10-CM

## 2018-10-28 DIAGNOSIS — Z5111 Encounter for antineoplastic chemotherapy: Secondary | ICD-10-CM | POA: Diagnosis not present

## 2018-10-28 DIAGNOSIS — Z79899 Other long term (current) drug therapy: Secondary | ICD-10-CM | POA: Insufficient documentation

## 2018-10-28 DIAGNOSIS — Z86718 Personal history of other venous thrombosis and embolism: Secondary | ICD-10-CM | POA: Insufficient documentation

## 2018-10-28 DIAGNOSIS — C786 Secondary malignant neoplasm of retroperitoneum and peritoneum: Secondary | ICD-10-CM | POA: Diagnosis not present

## 2018-10-28 MED ORDER — SODIUM CHLORIDE 0.9% FLUSH
10.0000 mL | INTRAVENOUS | Status: DC | PRN
  Administered 2018-10-28: 10 mL
  Filled 2018-10-28: qty 10

## 2018-10-28 MED ORDER — HEPARIN SOD (PORK) LOCK FLUSH 100 UNIT/ML IV SOLN
500.0000 [IU] | Freq: Once | INTRAVENOUS | Status: AC | PRN
  Administered 2018-10-28: 500 [IU]
  Filled 2018-10-28: qty 5

## 2018-10-28 MED FILL — oxyCODONE HCL 10 MG TABS: 10 | 15 days supply | Qty: 90 | Fill #0

## 2018-11-07 NOTE — Progress Notes (Signed)
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 Latest Ref Rng & Units  10/26/2018 10/10/2018 09/27/2018  Glucose 70 - 99 mg/dL 96 118(H) 131(H)  BUN 6 - 20 mg/dL 6 9 3(L)  Creatinine 0.61 - 1.24 mg/dL 0.78 0.79 0.77  Sodium 135 - 145 mmol/L 145 140 139  Potassium 3.5 - 5.1 mmol/L 3.8 4.0 3.7  Chloride 98 - 111 mmol/L 108 105 101  CO2 22 - 32 mmol/L '26 27 27  '$ Calcium 8.9 - 10.3 mg/dL 8.9 9.3 9.1  Total Protein 6.5 - 8.1 g/dL 7.6 7.7 8.1  Total Bilirubin 0.3 - 1.2 mg/dL 0.2(L) 0.3 <0.2(L)  Alkaline Phos 38 - 126 U/L 87 82 90  AST 15 - 41 U/L 17 18 12(L)  ALT 0 - 44 U/L '6 6 6    '$ CEA (0-5NG/ML) 01/22/2017: 12.31 02/16/17: 21.24 03/16/2017: 15.14 04/26/2017: 3.37 05/25/2017: 4.31 06/22/2017: 5.53 07/11/17: 4.1 07/26/17: 4.24 08/31/2017: 5.83 10/04/17: 6.07 11/01/17: 7.97 11/29/17: 9.37 12/27/17: 8.01 01/24/18: 7.57 02/21/18: 9.14 03/21/18: 9.87 04/18/18: 13.41 05/16/18: 16.15 06/20/18: 13.06 07/18/18: 19.66 08/19/18: 16.77 09/08/18:  13.97 10/10/18: 16.08    PATHOLOGY Diagnosis 01/12/2017 1. Colon, biopsy, splenic flexure - INVASIVE ADENOCARCINOMA. 2. Colon, polyp(s), sigmoid - TUBULOVILLOUS ADENOMA WITH FOCAL HIGH  GRADE DYSPLASIA (5%). - POLYPECTOMY RESECTION MARGIN IS NEGATIVE FOR DYSPLASIA.     RADIOGRAPHIC STUDIES: I have personally reviewed the radiological images as listed and agreed with the findings in the report.  09/14/2018 CT Chest IMPRESSION: 1. No definite source of infection identified within the chest. 2. Small bilateral pleural effusions with bibasilar atelectasis, unchanged from abdominal CT done yesterday. 3. No thoracic adenopathy or suspicious pulmonary nodule. 4. Grossly stable appearance of the visualized upper abdomen with a known mass invading the splenic hilum.  09/13/2018 CT AP IMPRESSION: 1. Trace right and small left pleural effusions, new since prior. 2. Faint 8 mm hypervascular blush in the right hepatic lobe, nonspecific but given history of colon cancer, cannot exclude a hypervascular metastatic focus. 3. Interval increase in hypodense mass at the splenic hilum insinuating into the splenic parenchyma. 4. Mild thickening of the duodenum query duodenitis. 5. Similar thickened abnormal appearance of the proximal descending colon as before. 6. Slightly smaller appearing peritoneal implants in the left upper quadrant.  07/07/2018 CT CAP W contrast IMPRESSION: 1. Mild interval increase in size of cystic mass within the splenic hilum. 2. Similar-appearing to mildly increased calcified soft tissue mass within the mesentery and wall thickening of the descending colon. 3. Aortic Atherosclerosis (ICD10-I70.0) and Emphysema (ICD10-J43.9).  CT CAP W Contrast 03/31/18 IMPRESSION: 1. Interval increase in size, particularly craniocaudal extent, of the cystic mass within the splenic hilum. 2. Similar-appearing partially calcified soft tissue mass within the mesentery. 3. Interval increase in wall thickening of the descending colon.  CT AP WO Contrast 01/08/18 IMPRESSION: 1. Limited exam, without oral or IV contrast. 2. No evidence of abdominopelvic hemorrhage. 3.  Small bilateral pleural effusions. 4. Grossly similar mass within the splenic hilum. 5.  Aortic Atherosclerosis (ICD10-I70.0).  Brain MRI WO Contrast 01/05/18 IMPRESSION: Negative MRI head with contrast.  Negative for metastatic disease.  CT AP W Contrast 12/30/17 IMPRESSION: 1. 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 However, there is now increasing thrombus within the left iliac and femoral veins with adjacent inflammation. 2. Mild circumferential bladder wall thickening which may be reactive or could indicate cystitis. Correlate clinically. 3. Unchanged complex cystic mass in the splenic hilum and focal wall thickening of the splenic flexure. 4.  Aortic Atherosclerosis (ICD10-I70.0).  CT CAP W Contrast 11/25/17 IMPRESSION: 1. Response  to therapy of intraperitoneal metastasis, including within the splenic hilum. 2.  No acute process or evidence of metastatic disease in the chest. 3. Persistent splenic flexure colonic wall thickening, with right-sided colostomy in place. 4.  Aortic Atherosclerosis (ICD10-I70.0).  CT AP W Contrast 08/15/17 IMPRESSION: 1. Early or new small bowel obstruction with a transition point in the right abdomen as above. The adjacent swirling vessels are most consistent with an internal hernia. 2. The mass involving the pancreatic tail and spleen is similar in the interval. 3. The previously identified abscess inferior to the pancreatic mass has resolved with minimal fluid in this region. The tube is been removed. 4. Continued thickening of the colon as above consistent with the patient's known malignancy. 5. Atherosclerotic changes in the aorta. Aortic Atherosclerosis (ICD10-I70.0).   CT AP W Contrast 07/29/17 IMPRESSION: 1. Bowel obstruction at the level of the proximal descending colon at the location of a percutaneous drainage catheter. This could be secondary to wall thickening by the adjacent inflammatory process. Wall thickening due  to colon neoplasm also remains a possibility. 2. Dilated, fluid-filled appendix, similar to the dilated loops of colon and small bowel, compatible with dilatation due to the bowel obstruction. There are no findings to indicate appendicitis. 3. Small amount of free peritoneal fluid. 4. The percutaneously drained fluid collection in the left mid abdomen has with resolved. 5. Mild decrease in size of the multiloculated fluid collection in the splenic hilum and distal tail of the pancreas. 6. Stable 8 mm diffusely enhancing mass in the dome of the liver on the right. This is most likely benign. A solitary enhancing metastasis is less likely. 7. Moderate prostatic hypertrophy.  CT CP W Contrast 07/10/17 IMPRESSION: The salient finding is a new abscess measuring 3.8 x 4.6 cm inferior to the pancreatic tail and spleen.  CT CAP w contrast 07/05/2017  IMPRESSION: 1. Today's study demonstrates some positive response to therapy with decreased size of mass associated with the descending colon, decreased size of the invasive mass extending from the tail of the pancreas into the splenic hilum and decreased size of adjacent peritoneal lesions. There has also been regression of upper abdominal lymphadenopathy. 2. No new pulmonary or hepatic lesions identified to suggest progressive metastatic disease. 3. Aortic atherosclerosis. 4. Additional incidental findings, as above.  CT CAP w Contrast 04/09/2017 IMPRESSION: 1. Response to therapy with decreased peritoneal tumor volume throughout the abdomen. 2. No well-defined residual colonic mass. No bowel obstruction or other acute complication. 3. Decrease in gastrohepatic ligament adenopathy. 4. No new sites of disease. 5.  No acute process or evidence of metastatic disease in the chest. 6.  Aortic atherosclerosis. 7. Mild gynecomastia.  Colonoscopy 01/12/2017 Impression: - Malignant near obstructing tumor in the descending colon and at the  splenic flexure. Biopsied. Tattooed. - One 25 mm polyp at the recto-sigmoid colon, removed with a hot snare. Resected and retrieved. Clips (MR unsafe) were placed.  Lower Extremity Ultrasound 02/16/17 Bilateral lower extremity venous duplex complete. There is evidence of deep vein thrombosis involving the femoral, popliteal, and peroneal veins of the right lower extremity.  There is no evidence of superficial vein thrombosis involving the right lower extremity. There is evidence of superficial vein thrombosis involving the lesser saphenous vein of the left lower extremity. There is no evidence of deep vein thrombosis involving the left lower extremity. There is no evidence of a Baker's cyst bilaterally.  ASSESSMENT & PLAN: Jeff [name] is a 57 y.o.  African-American male, without  a significant past medical history, no routine medical care, presented with intermittent rectal bleeding, anemia  and worsening abdominal pain for 3 years.   1. 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  -His case was previously reviewed in our GI tumor Board, unfortunately he has peritoneum metastasis, with a large 11 cm peritoneal metastasis in the left upper quadrant, which is consistent with peritoneal metastasis. -We previously reviewed the natural history of metastatic colon cancer. Unfortunately his cancer is incurable at this stage. We discussed the goal of therapy is palliative, to prolong his life and improve his quality of life. -He started first line chemo FOLFOX on 02/01/17. Due to history of pancytopenia, I skipped the 5-FU bolus. -I previously discussed the [facility] genomic test result, which showed wild type KRAS, NRAS, and BRAF, MSI-stable. Based on this and his left-sided colon cancer, he will significantly  benefit from EGFR inhibitor. He has started on panitumumab and has been tolerating well. -I previously reviewed the CT scan from 04/09/17 with the patient in detail. He has had excellent partial response to the chemotherapy treatment, no other new lesions, we'll continue current chemotherapy regimen. -I reviewed his restaging CT scan from 07/05/2017, which showed a continuous response, no new lesions. We'll continue current treatment. -Due to his thrombosis and GI bleeding, he is not a candidate for Avastin for now. Given questionable disease progression, I recommend him to continue panitumumab only after cycle 11 on 07/06/18.  -Due to his recent bowel obstruction, I think he probably has had disease progression, although scan showed stable disease . I recommend to switch his chemotherapy to irinotecan alone, or FOLFIRI if he is able to tolerate. The goal of therapy is palliative. -We had again discussed the incurable nature of his cancer, an overall poor prognosis. The goal of therapy is palliative. -Started irinotecan and panitumumab on 09/21/17. Tolerated well with increased ostomy output. Added 5-fu back with cycle 2 on 10/04/17. Increased to full dose FOLFIRI on 10/18/17. Pt did not tolerate the cycle 5 FOLFIRI well, despite dose reduction on both agents. He is very reluctant to continue same regiment.  -Given his restaging CT scan showed mixed response, but overall stable disease,  I held Irinotecan, and continue 5-FU/leucovorin and Panitumumab as maintenance therapy on 11/29/17. He has tolerated maintenance therapy well.  -During his episode of extensive left lower extremity DVT on 01/25/18, his treatment was held. Chemo treatment restarted after he recovered from DVT -He had a CT AP when he was in the hospital on January 08, 2018, which showed overall stable peritoneal metastasis. -CT CAP W Contrast from 03/31/18 revealed disease progression of the splenic hilum mass, now 7.7 cm from 5.2 cm. -He restarted  FOLFIRI on 04/18/18, I do not think he is a candidate for Avastin due to his thrombosis. Vectibix was stopped due to disease progression. He is tolerating dose reduced FOLFIRI well, no worsening diarrhea, it is manageable. -I previously reviewed his restaging CT from 07/07/18 with patient and daughter which shows slow, but significant progression since 01/2018.  -He started third line chemo Lonsurf on 07/25/18  -As he continues Lonsurf his abdominal pain and fatigue worsened, with significant diarrhea, no appetite and weight loss. I do not think he can tolerate Lonsurf any further, and he clinically did not response (with worsening pain), so I stopped after one  cycle.  -He had restarted FOLFIRI. Since his thrombosis has been very well controlled, he is tolerating Xarelto well, I added Avastin to his regiment, starting 09/08/2018.   --I previously discussed referring him to [facility] for a clinical Trial. He agreed.  But there is no suitable clinical trial available at [facility] for now. -He is clinically doing well, abdominal pain resolved, his main concern is left leg pain, controlled with pain medication. -Labs reviewed, CBC showed Hg 10.7. Good to proceed with chemo today. -Will repeat scan after 2 more cycles -F/u 4 weeks      2. History of right LE DVT, and extensive LLE DVT  -I previously reviewed his ultrasound results from 02/17/17, which showed DVT involving the femoral, popliteal, and peritoneal vein of the right lower extremity from 2/22. -He had history of right lower extremity DVT post-surgery in the past -His Xarelto has been held due to GI bleeding he has a IVC placement -No recurrence of bleeding since IVC placement.  -Pt was admitted to the hospital on 12/30/17 and discharged on 01/11/18. He presented with progressively worsening pain and swelling in the left leg. Imaging studies notable for worsening left LE DVT. Initially hesitant to start anticoagulation due to known history of GI  bleed while on Xarelto. Started on Eliquis and transferred to [facility] for thrombectomy. His left leg edema resolved after thrombectomy, he was discharged with Xarelto (he refused lovenox) '15mg'$  bid -He noticed recurrent GI bleeding around 01/25/2018, resolved after holding Xarelto.  Has restarted Xarelto at 20 mg daily dose, tolerating well without bleeding. -Continue Xarelto 15 mg daily, dose reduced due to her previous severe GI bleeding. - I previously advised him to hold Xarelto as needed when he experiences GI Bleeding.   3. Abdominal and knee and leg pain   -He has had left abdominal pain due to his metastasis and then back pain related to his chemo -pain controlled overall, he is on oxycodone '10mg'$  5-6 tab daily  -He is on Neurontin at night, he can take 200 mg for the next few weeks, and I previously changed dose to 300 mg TID.  -He states that he uses oxycodone 4-6 times a day. I educated him about the use of oxycodone and advised him to use as needed only.    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 -He has received IV Feraheme twice, but his anemia has not improved. He likely has component of anemia of chronic disease secondary to his underlying malignancy. -Continue ferrous Sulfate  5. Weight loss, malnutrition, anorexia -Secondary to chemotherapy and cancer -The patient will continue to drink dietary supplements. -He has gained some weight lately - I again strongly encouraged him to try to eat more to avoid losing excess weight. I again recommended he drink 2 ensure or boost supplements a day to help. -I previously prescribed mirtazapine on 05/25/17 in attempts to help his appetite. He knows to take it once every evening. -Continue follow-up with dietitian  -On Lonsurf, his appetite continued to decrease and began loosing weight. I previously stopped Lonsurf. He will continue Mirtazapine. His appetite  will likely improve once abdominal pain is controlled.    6. Bowel obstruction due to his colon mass at the splenic flexure and surrounding inflammation due to recent abscess, s/p diverting colostomy  -He was hospitalized on 07/29/17 for SBO, resolved  -Bowel movements overall have been normal, but sometimes he has  softer stools and are more loose.  -I again reviewed the usage of both imodium and colace and in what context he should be using them both for his stool consistencies.  -He has colostomy prolapse, he knows to avoid lifting and carry heavy things   7. Diarrhea  -Second to chemotherapy -Recommend him to take Lomotil 1 to 2 tablets 3 times daily before each meals, and use Imodium 2 tablets every 6-8 hours as needed in between. This controlled his diarrhea.  -Diarrhea had increased with Lonsurf. I previously stopped Lonsurf.  8. Goal of care discussion  -We previously discussed the incurable nature of his cancer, and the overall poor prognosis, especially if he does not have good response to chemotherapy or progress on chemo -The patient understands the goal of care is palliative. -I have previously recommended DNR/DNI, he will think about it.   Plan: -Labs reviewed, good to proceed with chemo FOLFIRI and Avastin today and continue every 2 weeks -f/u in 4 weeks  -refilled thorazine and oxycodone today  -I will repeat scan after 2 more chemo cycles   All questions were answered. The patient knows to call the clinic with any problems, questions or concerns.  I spent 20 minutes counseling the patient face to face. The total time spent in the appointment was 25 minutes and more than 50% was on counseling.  Dierdre Searles Dweik am acting as scribe for Dr. [name].  I have reviewed the above documentation for accuracy and completeness, and I agree with the above.     [name], MD 11/09/18

## 2018-11-09 ENCOUNTER — Inpatient Hospital Stay: Payer: Medicaid Other

## 2018-11-09 ENCOUNTER — Telehealth: Payer: Self-pay

## 2018-11-09 ENCOUNTER — Other Ambulatory Visit: Payer: Self-pay | Admitting: Hematology

## 2018-11-09 ENCOUNTER — Inpatient Hospital Stay (HOSPITAL_BASED_OUTPATIENT_CLINIC_OR_DEPARTMENT_OTHER): Payer: Medicaid Other | Admitting: Hematology

## 2018-11-09 ENCOUNTER — Encounter: Payer: Self-pay | Admitting: Hematology

## 2018-11-09 VITALS — BP 120/86 | HR 93 | Temp 97.7°F | Resp 18 | Ht 69.0 in | Wt 121.3 lb

## 2018-11-09 DIAGNOSIS — C189 Malignant neoplasm of colon, unspecified: Secondary | ICD-10-CM

## 2018-11-09 DIAGNOSIS — D509 Iron deficiency anemia, unspecified: Secondary | ICD-10-CM

## 2018-11-09 DIAGNOSIS — C186 Malignant neoplasm of descending colon: Secondary | ICD-10-CM | POA: Diagnosis not present

## 2018-11-09 DIAGNOSIS — C762 Malignant neoplasm of abdomen: Secondary | ICD-10-CM

## 2018-11-09 DIAGNOSIS — Z95828 Presence of other vascular implants and grafts: Secondary | ICD-10-CM

## 2018-11-09 DIAGNOSIS — Z5111 Encounter for antineoplastic chemotherapy: Secondary | ICD-10-CM | POA: Diagnosis not present

## 2018-11-09 DIAGNOSIS — Z7901 Long term (current) use of anticoagulants: Secondary | ICD-10-CM | POA: Diagnosis not present

## 2018-11-09 DIAGNOSIS — Z79899 Other long term (current) drug therapy: Secondary | ICD-10-CM

## 2018-11-09 DIAGNOSIS — Z86718 Personal history of other venous thrombosis and embolism: Secondary | ICD-10-CM | POA: Diagnosis not present

## 2018-11-09 DIAGNOSIS — Z933 Colostomy status: Secondary | ICD-10-CM

## 2018-11-09 DIAGNOSIS — R066 Hiccough: Secondary | ICD-10-CM

## 2018-11-09 DIAGNOSIS — C786 Secondary malignant neoplasm of retroperitoneum and peritoneum: Secondary | ICD-10-CM

## 2018-11-09 DIAGNOSIS — D500 Iron deficiency anemia secondary to blood loss (chronic): Secondary | ICD-10-CM

## 2018-11-09 DIAGNOSIS — D630 Anemia in neoplastic disease: Secondary | ICD-10-CM

## 2018-11-09 LAB — CBC WITH DIFFERENTIAL/PLATELET
Abs Immature Granulocytes: 0.01 10*3/uL (ref 0.00–0.07)
BASOS PCT: 1 %
Basophils Absolute: 0 10*3/uL (ref 0.0–0.1)
Eosinophils Absolute: 0.1 10*3/uL (ref 0.0–0.5)
Eosinophils Relative: 2 %
HEMATOCRIT: 34.3 % — AB (ref 39.0–52.0)
HEMOGLOBIN: 10.7 g/dL — AB (ref 13.0–17.0)
Immature Granulocytes: 0 %
LYMPHS ABS: 1.8 10*3/uL (ref 0.7–4.0)
LYMPHS PCT: 31 %
MCH: 27.5 pg (ref 26.0–34.0)
MCHC: 31.2 g/dL (ref 30.0–36.0)
MCV: 88.2 fL (ref 80.0–100.0)
MONO ABS: 0.6 10*3/uL (ref 0.1–1.0)
MONOS PCT: 11 %
NEUTROS ABS: 3.1 10*3/uL (ref 1.7–7.7)
Neutrophils Relative %: 55 %
Platelets: 184 10*3/uL (ref 150–400)
RBC: 3.89 MIL/uL — ABNORMAL LOW (ref 4.22–5.81)
RDW: 18.7 % — ABNORMAL HIGH (ref 11.5–15.5)
WBC: 5.6 10*3/uL (ref 4.0–10.5)
nRBC: 0 % (ref 0.0–0.2)

## 2018-11-09 LAB — COMPREHENSIVE METABOLIC PANEL
ALT: <6 U/L (ref 0–44)
ANION GAP: 6 (ref 5–15)
AST: 18 U/L (ref 15–41)
Albumin: 3.4 g/dL — ABNORMAL LOW (ref 3.5–5.0)
Alkaline Phosphatase: 79 U/L (ref 38–126)
BUN: 7 mg/dL (ref 6–20)
CALCIUM: 9.3 mg/dL (ref 8.9–10.3)
CO2: 25 mmol/L (ref 22–32)
Chloride: 105 mmol/L (ref 98–111)
Creatinine, Ser: 0.83 mg/dL (ref 0.61–1.24)
GFR calc non Af Amer: >60 mL/min (ref >60)
GLUCOSE: 122 mg/dL — AB (ref 70–99)
POTASSIUM: 3.9 mmol/L (ref 3.5–5.1)
SODIUM: 136 mmol/L (ref 135–145)
TOTAL PROTEIN: 8 g/dL (ref 6.5–8.1)
Total Bilirubin: 0.4 mg/dL (ref 0.3–1.2)

## 2018-11-09 LAB — IRON AND TIBC
IRON: 63 ug/dL (ref 42–163)
SATURATION RATIOS: 23 % (ref 20–55)
TIBC: 268 ug/dL (ref 202–409)
UIBC: 205 ug/dL (ref 117–376)

## 2018-11-09 LAB — TOTAL PROTEIN, URINE DIPSTICK: PROTEIN: NEGATIVE mg/dL

## 2018-11-09 LAB — CEA (IN HOUSE-CHCC): CEA (CHCC-In House): 14.27 ng/mL — ABNORMAL HIGH (ref 0.00–5.00)

## 2018-11-09 LAB — FERRITIN: Ferritin: 308 ng/mL (ref 24–336)

## 2018-11-09 MED ORDER — IRINOTECAN HCL CHEMO INJECTION 100 MG/5ML
120.0000 mg/m2 | Freq: Once | INTRAVENOUS | Status: AC
  Administered 2018-11-09: 200 mg via INTRAVENOUS
  Filled 2018-11-09: qty 10

## 2018-11-09 MED ORDER — DEXAMETHASONE SODIUM PHOSPHATE 10 MG/ML IJ SOLN
10.0000 mg | Freq: Once | INTRAMUSCULAR | Status: AC
  Administered 2018-11-09: 10 mg via INTRAVENOUS

## 2018-11-09 MED ORDER — PALONOSETRON HCL INJECTION 0.25 MG/5ML
INTRAVENOUS | Status: AC
  Filled 2018-11-09: qty 5

## 2018-11-09 MED ORDER — ATROPINE SULFATE 1 MG/ML IJ SOLN
0.5000 mg | Freq: Once | INTRAMUSCULAR | Status: AC | PRN
  Administered 2018-11-09: 0.5 mg via INTRAVENOUS

## 2018-11-09 MED ORDER — DEXAMETHASONE SODIUM PHOSPHATE 10 MG/ML IJ SOLN
INTRAMUSCULAR | Status: AC
  Filled 2018-11-09: qty 1

## 2018-11-09 MED ORDER — CHLORPROMAZINE HCL 10 MG PO TABS: 10.0000 mg | ORAL_TABLET | Freq: Three times a day (TID) | ORAL | 1 refills | Status: DC | PRN

## 2018-11-09 MED ORDER — SODIUM CHLORIDE 0.9% FLUSH
10.0000 mL | Freq: Once | INTRAVENOUS | Status: AC | PRN
  Administered 2018-11-09: 10 mL
  Filled 2018-11-09: qty 10

## 2018-11-09 MED ORDER — ATROPINE SULFATE 1 MG/ML IJ SOLN
INTRAMUSCULAR | Status: AC
  Filled 2018-11-09: qty 1

## 2018-11-09 MED ORDER — SODIUM CHLORIDE 0.9 % IV SOLN
2200.0000 mg/m2 | INTRAVENOUS | Status: DC
  Administered 2018-11-09: 3650 mg via INTRAVENOUS
  Filled 2018-11-09: qty 73

## 2018-11-09 MED ORDER — PALONOSETRON HCL INJECTION 0.25 MG/5ML
0.2500 mg | Freq: Once | INTRAVENOUS | Status: AC
  Administered 2018-11-09: 0.25 mg via INTRAVENOUS

## 2018-11-09 MED ORDER — LEUCOVORIN CALCIUM INJECTION 350 MG
400.0000 mg/m2 | Freq: Once | INTRAVENOUS | Status: AC
  Administered 2018-11-09: 660 mg via INTRAVENOUS
  Filled 2018-11-09: qty 33

## 2018-11-09 MED ORDER — SODIUM CHLORIDE 0.9 % IV SOLN
300.0000 mg | Freq: Once | INTRAVENOUS | Status: AC
  Administered 2018-11-09: 300 mg via INTRAVENOUS
  Filled 2018-11-09: qty 12

## 2018-11-09 MED ORDER — OXYCODONE HCL 10 MG PO TABS: 10.0000 mg | ORAL_TABLET | ORAL | 0 refills | Status: DC | PRN

## 2018-11-09 MED ORDER — SODIUM CHLORIDE 0.9 % IV SOLN
Freq: Once | INTRAVENOUS | Status: AC
  Administered 2018-11-09: 09:00:00 via INTRAVENOUS
  Filled 2018-11-09: qty 250

## 2018-11-09 MED ORDER — SODIUM CHLORIDE 0.9 % IV SOLN
Freq: Once | INTRAVENOUS | Status: DC
  Filled 2018-11-09: qty 250

## 2018-11-09 MED ORDER — SODIUM CHLORIDE 0.9% FLUSH
10.0000 mL | INTRAVENOUS | Status: DC | PRN
  Administered 2018-11-09: 10 mL
  Filled 2018-11-09: qty 10

## 2018-11-09 MED ORDER — HEPARIN SOD (PORK) LOCK FLUSH 100 UNIT/ML IV SOLN
500.0000 [IU] | Freq: Once | INTRAVENOUS | Status: DC | PRN
  Filled 2018-11-09: qty 5

## 2018-11-09 MED FILL — CHLORPROMAZINE 10 MG TABLET: 10 | 10 days supply | Qty: 30 | Fill #0

## 2018-11-09 NOTE — Telephone Encounter (Signed)
Printed avs and calender of upcoming appointment. Per 11/13 los added folfiri for 12/26 in the book

## 2018-11-09 NOTE — Patient Instructions (Signed)
[facility] Discharge Instructions for Patients Receiving Chemotherapy  Today you received the following chemotherapy agents: Avastin, Irinotecan, Leucovorin and Adrucil   To help prevent nausea and vomiting after your treatment, we encourage you to take your nausea medication as directed.    If you develop nausea and vomiting that is not controlled by your nausea medication, call the clinic.   BELOW ARE SYMPTOMS THAT SHOULD BE REPORTED IMMEDIATELY:  *FEVER GREATER THAN 100.5 F  *CHILLS WITH OR WITHOUT FEVER  NAUSEA AND VOMITING THAT IS NOT CONTROLLED WITH YOUR NAUSEA MEDICATION  *UNUSUAL SHORTNESS OF BREATH  *UNUSUAL BRUISING OR BLEEDING  TENDERNESS IN MOUTH AND THROAT WITH OR WITHOUT PRESENCE OF ULCERS  *URINARY PROBLEMS  *BOWEL PROBLEMS  UNUSUAL RASH Items with * indicate a potential emergency and should be followed up as soon as possible.  Feel free to call the clinic should you have any questions or concerns. The clinic phone number is [phone].  Please show the CHEMO ALERT CARD at check-in to the Emergency Department and triage nurse.   

## 2018-11-11 ENCOUNTER — Inpatient Hospital Stay: Payer: Medicaid Other

## 2018-11-11 VITALS — BP 106/70 | HR 99 | Temp 98.1°F | Resp 18

## 2018-11-11 DIAGNOSIS — C189 Malignant neoplasm of colon, unspecified: Secondary | ICD-10-CM

## 2018-11-11 DIAGNOSIS — Z5111 Encounter for antineoplastic chemotherapy: Secondary | ICD-10-CM | POA: Diagnosis not present

## 2018-11-11 MED ORDER — HEPARIN SOD (PORK) LOCK FLUSH 100 UNIT/ML IV SOLN
500.0000 [IU] | Freq: Once | INTRAVENOUS | Status: AC | PRN
  Administered 2018-11-11: 500 [IU]
  Filled 2018-11-11: qty 5

## 2018-11-11 MED ORDER — SODIUM CHLORIDE 0.9% FLUSH
10.0000 mL | INTRAVENOUS | Status: DC | PRN
  Administered 2018-11-11: 10 mL
  Filled 2018-11-11: qty 10

## 2018-11-11 MED FILL — oxyCODONE HCL 10 MG TABS: 10 | 15 days supply | Qty: 90 | Fill #0

## 2018-11-23 ENCOUNTER — Inpatient Hospital Stay: Payer: Medicaid Other

## 2018-11-23 ENCOUNTER — Other Ambulatory Visit: Payer: Self-pay

## 2018-11-23 ENCOUNTER — Ambulatory Visit: Payer: Self-pay | Admitting: Hematology

## 2018-11-23 VITALS — BP 128/96 | HR 88 | Temp 98.6°F | Resp 16

## 2018-11-23 DIAGNOSIS — Z5111 Encounter for antineoplastic chemotherapy: Secondary | ICD-10-CM | POA: Diagnosis not present

## 2018-11-23 DIAGNOSIS — C762 Malignant neoplasm of abdomen: Secondary | ICD-10-CM

## 2018-11-23 DIAGNOSIS — D500 Iron deficiency anemia secondary to blood loss (chronic): Secondary | ICD-10-CM

## 2018-11-23 DIAGNOSIS — C189 Malignant neoplasm of colon, unspecified: Secondary | ICD-10-CM

## 2018-11-23 DIAGNOSIS — Z95828 Presence of other vascular implants and grafts: Secondary | ICD-10-CM

## 2018-11-23 LAB — CBC WITH DIFFERENTIAL/PLATELET
Abs Immature Granulocytes: 0.02 K/uL (ref 0.00–0.07)
Basophils Absolute: 0.1 K/uL (ref 0.0–0.1)
Basophils Relative: 1 %
Eosinophils Absolute: 0.1 K/uL (ref 0.0–0.5)
Eosinophils Relative: 2 %
HCT: 31.4 % — ABNORMAL LOW (ref 39.0–52.0)
Hemoglobin: 9.9 g/dL — ABNORMAL LOW (ref 13.0–17.0)
Immature Granulocytes: 0 %
Lymphocytes Relative: 29 %
Lymphs Abs: 1.4 K/uL (ref 0.7–4.0)
MCH: 27 pg (ref 26.0–34.0)
MCHC: 31.5 g/dL (ref 30.0–36.0)
MCV: 85.8 fL (ref 80.0–100.0)
Monocytes Absolute: 0.6 K/uL (ref 0.1–1.0)
Monocytes Relative: 12 %
Neutro Abs: 2.7 K/uL (ref 1.7–7.7)
Neutrophils Relative %: 56 %
Platelets: 145 K/uL — ABNORMAL LOW (ref 150–400)
RBC: 3.66 MIL/uL — ABNORMAL LOW (ref 4.22–5.81)
RDW: 17.9 % — ABNORMAL HIGH (ref 11.5–15.5)
WBC: 4.7 K/uL (ref 4.0–10.5)
nRBC: 0 % (ref 0.0–0.2)

## 2018-11-23 LAB — COMPREHENSIVE METABOLIC PANEL WITH GFR
ALT: <6 U/L (ref 0–44)
AST: 17 U/L (ref 15–41)
Albumin: 3.2 g/dL — ABNORMAL LOW (ref 3.5–5.0)
Alkaline Phosphatase: 91 U/L (ref 38–126)
Anion gap: 10 (ref 5–15)
BUN: 8 mg/dL (ref 6–20)
CO2: 25 mmol/L (ref 22–32)
Calcium: 9 mg/dL (ref 8.9–10.3)
Chloride: 105 mmol/L (ref 98–111)
Creatinine, Ser: 0.8 mg/dL (ref 0.61–1.24)
GFR calc Af Amer: >60 mL/min (ref >60)
GFR calc non Af Amer: >60 mL/min (ref >60)
Glucose, Bld: 100 mg/dL — ABNORMAL HIGH (ref 70–99)
Potassium: 4.1 mmol/L (ref 3.5–5.1)
Sodium: 140 mmol/L (ref 135–145)
Total Bilirubin: 0.3 mg/dL (ref 0.3–1.2)
Total Protein: 7.5 g/dL (ref 6.5–8.1)

## 2018-11-23 LAB — TOTAL PROTEIN, URINE DIPSTICK: Protein, ur: NEGATIVE mg/dL

## 2018-11-23 MED ORDER — PALONOSETRON HCL INJECTION 0.25 MG/5ML
INTRAVENOUS | Status: AC
  Filled 2018-11-23: qty 5

## 2018-11-23 MED ORDER — SODIUM CHLORIDE 0.9 % IV SOLN: 5.0000 mg/kg | Freq: Once | INTRAVENOUS | Status: DC

## 2018-11-23 MED ORDER — ATROPINE SULFATE 1 MG/ML IJ SOLN
0.5000 mg | Freq: Once | INTRAMUSCULAR | Status: AC | PRN
  Administered 2018-11-23: 0.5 mg via INTRAVENOUS

## 2018-11-23 MED ORDER — SODIUM CHLORIDE 0.9 % IV SOLN
300.0000 mg | Freq: Once | INTRAVENOUS | Status: AC
  Administered 2018-11-23: 300 mg via INTRAVENOUS
  Filled 2018-11-23: qty 12

## 2018-11-23 MED ORDER — ACETAMINOPHEN 325 MG PO TABS
ORAL_TABLET | ORAL | Status: AC
  Filled 2018-11-23: qty 2

## 2018-11-23 MED ORDER — SODIUM CHLORIDE 0.9 % IV SOLN
2200.0000 mg/m2 | INTRAVENOUS | Status: DC
  Administered 2018-11-23: 3650 mg via INTRAVENOUS
  Filled 2018-11-23: qty 73

## 2018-11-23 MED ORDER — IRINOTECAN HCL CHEMO INJECTION 100 MG/5ML
120.0000 mg/m2 | Freq: Once | INTRAVENOUS | Status: AC
  Administered 2018-11-23: 200 mg via INTRAVENOUS
  Filled 2018-11-23: qty 10

## 2018-11-23 MED ORDER — ACETAMINOPHEN 325 MG PO TABS
650.0000 mg | ORAL_TABLET | Freq: Once | ORAL | Status: AC
  Administered 2018-11-23: 650 mg via ORAL

## 2018-11-23 MED ORDER — DEXAMETHASONE SODIUM PHOSPHATE 10 MG/ML IJ SOLN
INTRAMUSCULAR | Status: AC
  Filled 2018-11-23: qty 1

## 2018-11-23 MED ORDER — LEUCOVORIN CALCIUM INJECTION 350 MG
400.0000 mg/m2 | Freq: Once | INTRAVENOUS | Status: AC
  Administered 2018-11-23: 660 mg via INTRAVENOUS
  Filled 2018-11-23: qty 33

## 2018-11-23 MED ORDER — DEXAMETHASONE SODIUM PHOSPHATE 10 MG/ML IJ SOLN
10.0000 mg | Freq: Once | INTRAMUSCULAR | Status: AC
  Administered 2018-11-23: 10 mg via INTRAVENOUS

## 2018-11-23 MED ORDER — PALONOSETRON HCL INJECTION 0.25 MG/5ML
0.2500 mg | Freq: Once | INTRAVENOUS | Status: AC
  Administered 2018-11-23: 0.25 mg via INTRAVENOUS

## 2018-11-23 MED ORDER — ATROPINE SULFATE 1 MG/ML IJ SOLN
INTRAMUSCULAR | Status: AC
  Filled 2018-11-23: qty 1

## 2018-11-23 MED ORDER — SODIUM CHLORIDE 0.9 % IV SOLN
Freq: Once | INTRAVENOUS | Status: AC
  Administered 2018-11-23: 12:00:00 via INTRAVENOUS
  Filled 2018-11-23: qty 250

## 2018-11-23 MED ORDER — SODIUM CHLORIDE 0.9 % IV SOLN
Freq: Once | INTRAVENOUS | Status: AC
  Administered 2018-11-23: 11:00:00 via INTRAVENOUS
  Filled 2018-11-23: qty 250

## 2018-11-23 MED ORDER — SODIUM CHLORIDE 0.9% FLUSH
10.0000 mL | INTRAVENOUS | Status: DC | PRN
  Administered 2018-11-23: 10 mL via INTRAVENOUS
  Filled 2018-11-23: qty 10

## 2018-11-23 NOTE — Patient Instructions (Addendum)
We will see you Friday at 12:15 Friday! Safe holiday travels!  [facility] Discharge Instructions for Patients Receiving Chemotherapy  Today you received the following chemotherapy agents Avastin, Irinotecan, Leucovorin and Adrucil   To help prevent nausea and vomiting after your treatment, we encourage you to take your nausea medication as directed.    If you develop nausea and vomiting that is not controlled by your nausea medication, call the clinic.   BELOW ARE SYMPTOMS THAT SHOULD BE REPORTED IMMEDIATELY:  *FEVER GREATER THAN 100.5 F  *CHILLS WITH OR WITHOUT FEVER  NAUSEA AND VOMITING THAT IS NOT CONTROLLED WITH YOUR NAUSEA MEDICATION  *UNUSUAL SHORTNESS OF BREATH  *UNUSUAL BRUISING OR BLEEDING  TENDERNESS IN MOUTH AND THROAT WITH OR WITHOUT PRESENCE OF ULCERS  *URINARY PROBLEMS  *BOWEL PROBLEMS  UNUSUAL RASH Items with * indicate a potential emergency and should be followed up as soon as possible.  Feel free to call the clinic should you have any questions or concerns. The clinic phone number is [phone].  Please show the [place] at check-in to the Emergency Department and triage nurse.

## 2018-11-25 ENCOUNTER — Inpatient Hospital Stay: Payer: Medicaid Other

## 2018-11-25 DIAGNOSIS — C189 Malignant neoplasm of colon, unspecified: Secondary | ICD-10-CM

## 2018-11-25 DIAGNOSIS — Z5111 Encounter for antineoplastic chemotherapy: Secondary | ICD-10-CM | POA: Diagnosis not present

## 2018-11-25 MED ORDER — SODIUM CHLORIDE 0.9% FLUSH
10.0000 mL | INTRAVENOUS | Status: DC | PRN
  Administered 2018-11-25: 10 mL
  Filled 2018-11-25: qty 10

## 2018-11-25 MED ORDER — HEPARIN SOD (PORK) LOCK FLUSH 100 UNIT/ML IV SOLN
500.0000 [IU] | Freq: Once | INTRAVENOUS | Status: AC | PRN
  Administered 2018-11-25: 500 [IU]
  Filled 2018-11-25: qty 5

## 2018-11-28 ENCOUNTER — Telehealth: Payer: Self-pay

## 2018-11-28 ENCOUNTER — Other Ambulatory Visit: Payer: Self-pay | Admitting: Hematology

## 2018-11-28 DIAGNOSIS — C189 Malignant neoplasm of colon, unspecified: Secondary | ICD-10-CM

## 2018-11-28 MED FILL — XARELTO 15 MG TABLET: 15 | 30 days supply | Qty: 30 | Fill #2

## 2018-11-28 MED FILL — POTASSIUM CHLORIDE CRYS ER: 20 | 30 days supply | Qty: 30 | Fill #2

## 2018-11-28 MED FILL — DIPHENOXYLATE-ATROPINE 2.5-: 2.5-0.025 | 11 days supply | Qty: 90 | Fill #0

## 2018-11-28 MED FILL — GABAPENTIN 300 MG CAPSULE: 300 | 30 days supply | Qty: 90 | Fill #1

## 2018-12-01 ENCOUNTER — Other Ambulatory Visit: Payer: Self-pay | Admitting: Nurse Practitioner

## 2018-12-01 ENCOUNTER — Telehealth: Payer: Self-pay | Admitting: *Deleted

## 2018-12-01 DIAGNOSIS — C189 Malignant neoplasm of colon, unspecified: Secondary | ICD-10-CM

## 2018-12-01 NOTE — Telephone Encounter (Signed)
Attempt made to contact patient regarding rx request for Oxycodone- left a voice message for a return call.

## 2018-12-02 ENCOUNTER — Telehealth: Payer: Self-pay

## 2018-12-02 ENCOUNTER — Other Ambulatory Visit: Payer: Self-pay | Admitting: Nurse Practitioner

## 2018-12-02 DIAGNOSIS — C189 Malignant neoplasm of colon, unspecified: Secondary | ICD-10-CM

## 2018-12-02 MED ORDER — OXYCODONE HCL 10 MG PO TABS: 10.0000 mg | ORAL_TABLET | ORAL | 0 refills | Status: DC | PRN

## 2018-12-02 MED FILL — oxyCODONE HCL 10 MG TABS: 10 | 15 days supply | Qty: 90 | Fill #0

## 2018-12-02 NOTE — Telephone Encounter (Signed)
Done. Thanks, Lacie 

## 2018-12-02 NOTE — Telephone Encounter (Signed)
Patient's daughter request phone call regarding his refill for Oxycodone.  [phone]

## 2018-12-07 ENCOUNTER — Inpatient Hospital Stay: Payer: Medicaid Other

## 2018-12-07 ENCOUNTER — Telehealth: Payer: Self-pay | Admitting: Hematology

## 2018-12-07 ENCOUNTER — Inpatient Hospital Stay: Payer: Medicaid Other | Admitting: Hematology

## 2018-12-07 NOTE — Telephone Encounter (Signed)
Message was given to [name] to call patient's daughter.

## 2018-12-07 NOTE — Telephone Encounter (Signed)
R/s appt from today to tomorrow per 12/11 sch message - pt is aware of appt
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
## 2018-12-08 ENCOUNTER — Inpatient Hospital Stay: Payer: Medicaid Other | Attending: Hematology

## 2018-12-08 ENCOUNTER — Inpatient Hospital Stay: Payer: Medicaid Other

## 2018-12-08 ENCOUNTER — Encounter: Payer: Self-pay | Admitting: Hematology

## 2018-12-08 ENCOUNTER — Inpatient Hospital Stay (HOSPITAL_BASED_OUTPATIENT_CLINIC_OR_DEPARTMENT_OTHER): Payer: Medicaid Other | Admitting: Hematology

## 2018-12-08 ENCOUNTER — Other Ambulatory Visit: Payer: Self-pay | Admitting: Emergency Medicine

## 2018-12-08 VITALS — BP 117/79 | HR 92 | Temp 98.9°F | Resp 16

## 2018-12-08 DIAGNOSIS — Z86718 Personal history of other venous thrombosis and embolism: Secondary | ICD-10-CM | POA: Insufficient documentation

## 2018-12-08 DIAGNOSIS — Z933 Colostomy status: Secondary | ICD-10-CM

## 2018-12-08 DIAGNOSIS — D630 Anemia in neoplastic disease: Secondary | ICD-10-CM

## 2018-12-08 DIAGNOSIS — Z79899 Other long term (current) drug therapy: Secondary | ICD-10-CM | POA: Insufficient documentation

## 2018-12-08 DIAGNOSIS — R911 Solitary pulmonary nodule: Secondary | ICD-10-CM | POA: Diagnosis not present

## 2018-12-08 DIAGNOSIS — R12 Heartburn: Secondary | ICD-10-CM | POA: Diagnosis not present

## 2018-12-08 DIAGNOSIS — R066 Hiccough: Secondary | ICD-10-CM | POA: Diagnosis not present

## 2018-12-08 DIAGNOSIS — Z7901 Long term (current) use of anticoagulants: Secondary | ICD-10-CM

## 2018-12-08 DIAGNOSIS — C186 Malignant neoplasm of descending colon: Secondary | ICD-10-CM | POA: Diagnosis not present

## 2018-12-08 DIAGNOSIS — Z5111 Encounter for antineoplastic chemotherapy: Secondary | ICD-10-CM | POA: Diagnosis not present

## 2018-12-08 DIAGNOSIS — Z95828 Presence of other vascular implants and grafts: Secondary | ICD-10-CM

## 2018-12-08 DIAGNOSIS — D500 Iron deficiency anemia secondary to blood loss (chronic): Secondary | ICD-10-CM

## 2018-12-08 DIAGNOSIS — C189 Malignant neoplasm of colon, unspecified: Secondary | ICD-10-CM

## 2018-12-08 DIAGNOSIS — I2699 Other pulmonary embolism without acute cor pulmonale: Secondary | ICD-10-CM | POA: Insufficient documentation

## 2018-12-08 DIAGNOSIS — C786 Secondary malignant neoplasm of retroperitoneum and peritoneum: Secondary | ICD-10-CM

## 2018-12-08 DIAGNOSIS — C762 Malignant neoplasm of abdomen: Secondary | ICD-10-CM

## 2018-12-08 LAB — COMPREHENSIVE METABOLIC PANEL
ALK PHOS: 86 U/L (ref 38–126)
ANION GAP: 11 (ref 5–15)
AST: 11 U/L — ABNORMAL LOW (ref 15–41)
Albumin: 3 g/dL — ABNORMAL LOW (ref 3.5–5.0)
BILIRUBIN TOTAL: 0.5 mg/dL (ref 0.3–1.2)
BUN: 8 mg/dL (ref 6–20)
CHLORIDE: 103 mmol/L (ref 98–111)
CO2: 23 mmol/L (ref 22–32)
Calcium: 9 mg/dL (ref 8.9–10.3)
Creatinine, Ser: 0.85 mg/dL (ref 0.61–1.24)
GFR calc Af Amer: >60 mL/min (ref >60)
Glucose, Bld: 140 mg/dL — ABNORMAL HIGH (ref 70–99)
POTASSIUM: 3.9 mmol/L (ref 3.5–5.1)
Sodium: 137 mmol/L (ref 135–145)
Total Protein: 8.2 g/dL — ABNORMAL HIGH (ref 6.5–8.1)

## 2018-12-08 LAB — CBC WITH DIFFERENTIAL/PLATELET
Abs Immature Granulocytes: 0.03 10*3/uL (ref 0.00–0.07)
Basophils Absolute: 0.1 10*3/uL (ref 0.0–0.1)
Basophils Relative: 1 %
EOS PCT: 1 %
Eosinophils Absolute: 0.1 10*3/uL (ref 0.0–0.5)
HCT: 36.5 % — ABNORMAL LOW (ref 39.0–52.0)
Hemoglobin: 11.2 g/dL — ABNORMAL LOW (ref 13.0–17.0)
IMMATURE GRANULOCYTES: 0 %
Lymphocytes Relative: 19 %
Lymphs Abs: 1.5 10*3/uL (ref 0.7–4.0)
MCH: 26.2 pg (ref 26.0–34.0)
MCHC: 30.7 g/dL (ref 30.0–36.0)
MCV: 85.3 fL (ref 80.0–100.0)
Monocytes Absolute: 0.9 10*3/uL (ref 0.1–1.0)
Monocytes Relative: 11 %
NRBC: 0 % (ref 0.0–0.2)
Neutro Abs: 5.4 10*3/uL (ref 1.7–7.7)
Neutrophils Relative %: 68 %
Platelets: 131 10*3/uL — ABNORMAL LOW (ref 150–400)
RBC: 4.28 MIL/uL (ref 4.22–5.81)
RDW: 17.2 % — ABNORMAL HIGH (ref 11.5–15.5)
WBC: 8 10*3/uL (ref 4.0–10.5)

## 2018-12-08 LAB — URINALYSIS, COMPLETE (UACMP) WITH MICROSCOPIC
Bilirubin Urine: NEGATIVE
GLUCOSE, UA: NEGATIVE mg/dL
Hgb urine dipstick: NEGATIVE
Ketones, ur: NEGATIVE mg/dL
Leukocytes, UA: NEGATIVE
NITRITE: NEGATIVE
Protein, ur: NEGATIVE mg/dL
Specific Gravity, Urine: 1.013 (ref 1.005–1.030)
pH: 5 (ref 5.0–8.0)

## 2018-12-08 LAB — TOTAL PROTEIN, URINE DIPSTICK: Protein, ur: <30 mg/dL — AB

## 2018-12-08 LAB — FERRITIN: FERRITIN: 266 ng/mL (ref 24–336)

## 2018-12-08 LAB — CEA (IN HOUSE-CHCC): CEA (CHCC-IN HOUSE): 10.7 ng/mL — AB (ref 0.00–5.00)

## 2018-12-08 MED ORDER — SODIUM CHLORIDE 0.9% FLUSH
10.0000 mL | Freq: Once | INTRAVENOUS | Status: AC | PRN
  Administered 2018-12-08: 10 mL
  Filled 2018-12-08: qty 10

## 2018-12-08 MED ORDER — HEPARIN SOD (PORK) LOCK FLUSH 100 UNIT/ML IV SOLN
500.0000 [IU] | Freq: Once | INTRAVENOUS | Status: AC
  Administered 2018-12-08: 500 [IU] via INTRAVENOUS
  Filled 2018-12-08: qty 5

## 2018-12-08 MED ORDER — MORPHINE SULFATE ER 15 MG PO TBCR: 15.0000 mg | EXTENDED_RELEASE_TABLET | Freq: Two times a day (BID) | ORAL | 0 refills | Status: DC

## 2018-12-08 MED ORDER — SODIUM CHLORIDE 0.9 % IV SOLN
INTRAVENOUS | Status: AC
  Administered 2018-12-08: 11:00:00 via INTRAVENOUS
  Filled 2018-12-08 (×2): qty 250

## 2018-12-08 MED ORDER — AMOXICILLIN-POT CLAVULANATE 875-125 MG PO TABS: 1.0000 | ORAL_TABLET | Freq: Two times a day (BID) | ORAL | 0 refills | Status: DC

## 2018-12-08 MED ORDER — SODIUM CHLORIDE 0.9% FLUSH
10.0000 mL | Freq: Once | INTRAVENOUS | Status: AC
  Administered 2018-12-08: 10 mL via INTRAVENOUS
  Filled 2018-12-08: qty 10

## 2018-12-08 MED FILL — MORPHINE SULF ER 15 MG TAB: 15 | 30 days supply | Qty: 60 | Fill #0

## 2018-12-08 MED FILL — AMOX-CLAV 875-125 MG TABLET: 875-125 | 7 days supply | Qty: 14 | Fill #0

## 2018-12-08 MED FILL — MIRTAZAPINE 15 MG TABLET: 15 | 30 days supply | Qty: 30 | Fill #1

## 2018-12-08 NOTE — Progress Notes (Signed)
Pt arrived today with c/o fever, fatigue, nausea, and diarrhea. Reported to [name] during assessment chairside. Treatment cancelled today. Urine and blood cultures ordered to determine presence of infection. Oral abx sent to pharmacy to take at home. 1L NS ordered to administer over 2 hours. Pt aware of plan. Ok to d/c after IVF completed and VS reassessed.

## 2018-12-08 NOTE — Patient Instructions (Addendum)
Dehydration, Adult Dehydration is a condition in which there is not enough fluid or water in the body. This happens when you lose more fluids than you take in. Important organs, such as the kidneys, brain, and heart, cannot function without a proper amount of fluids. Any loss of fluids from the body can lead to dehydration. Dehydration can range from mild to severe. This condition should be treated right away to prevent it from becoming severe. What are the causes? This condition may be caused by:  Vomiting.  Diarrhea.  Excessive sweating, such as from heat exposure or exercise.  Not drinking enough fluid, especially: ? When ill. ? While doing activity that requires a lot of energy.  Excessive urination.  Fever.  Infection.  Certain medicines, such as medicines that cause the body to lose excess fluid (diuretics).  Inability to access safe drinking water.  Reduced physical ability to get adequate water and food.  What increases the risk? This condition is more likely to develop in people:  Who have a poorly controlled long-term (chronic) illness, such as diabetes, heart disease, or kidney disease.  Who are age 65 or older.  Who are disabled.  Who live in a place with high altitude.  Who play endurance sports.  What are the signs or symptoms? Symptoms of mild dehydration may include:  Thirst.  Dry lips.  Slightly dry mouth.  Dry, warm skin.  Dizziness. Symptoms of moderate dehydration may include:  Very dry mouth.  Muscle cramps.  Dark urine. Urine may be the color of tea.  Decreased urine production.  Decreased tear production.  Heartbeat that is irregular or faster than normal (palpitations).  Headache.  Light-headedness, especially when you stand up from a sitting position.  Fainting (syncope). Symptoms of severe dehydration may include:  Changes in skin, such as: ? Cold and clammy skin. ? Blotchy (mottled) or pale skin. ? Skin that does  not quickly return to normal after being lightly pinched and released (poor skin turgor).  Changes in body fluids, such as: ? Extreme thirst. ? No tear production. ? Inability to sweat when body temperature is high, such as in hot weather. ? Very little urine production.  Changes in vital signs, such as: ? Weak pulse. ? Pulse that is more than 100 beats a minute when sitting still. ? Rapid breathing. ? Low blood pressure.  Other changes, such as: ? Sunken eyes. ? Cold hands and feet. ? Confusion. ? Lack of energy (lethargy). ? Difficulty waking up from sleep. ? Short-term weight loss. ? Unconsciousness. How is this diagnosed? This condition is diagnosed based on your symptoms and a physical exam. Blood and urine tests may be done to help confirm the diagnosis. How is this treated? Treatment for this condition depends on the severity. Mild or moderate dehydration can often be treated at home. Treatment should be started right away. Do not wait until dehydration becomes severe. Severe dehydration is an emergency and it needs to be treated in a hospital. Treatment for mild dehydration may include:  Drinking more fluids.  Replacing salts and minerals in your blood (electrolytes) that you may have lost. Treatment for moderate dehydration may include:  Drinking an oral rehydration solution (ORS). This is a drink that helps you replace fluids and electrolytes (rehydrate). It can be found at pharmacies and retail stores. Treatment for severe dehydration may include:  Receiving fluids through an IV tube.  Receiving an electrolyte solution through a feeding tube that is passed through your nose   and into your stomach (nasogastric tube, or NG tube).  Correcting any abnormalities in electrolytes.  Treating the underlying cause of dehydration. Follow these instructions at home:  If directed by your health care provider, drink an ORS: ? Make an ORS by following instructions on the  package. ? Start by drinking small amounts, about  cup (120 mL) every 5-10 minutes. ? Slowly increase how much you drink until you have taken the amount recommended by your health care provider.  Drink enough clear fluid to keep your urine clear or pale yellow. If you were told to drink an ORS, finish the ORS first, then start slowly drinking other clear fluids. Drink fluids such as: ? Water. Do not drink only water. Doing that can lead to having too little salt (sodium) in the body (hyponatremia). ? Ice chips. ? Fruit juice that you have added water to (diluted fruit juice). ? Low-calorie sports drinks.  Avoid: ? Alcohol. ? Drinks that contain a lot of sugar. These include high-calorie sports drinks, fruit juice that is not diluted, and soda. ? Caffeine. ? Foods that are greasy or contain a lot of fat or sugar.  Take over-the-counter and prescription medicines only as told by your health care provider.  Do not take sodium tablets. This can lead to having too much sodium in the body (hypernatremia).  Eat foods that contain a healthy balance of electrolytes, such as bananas, oranges, potatoes, tomatoes, and spinach.  Keep all follow-up visits as told by your health care provider. This is important. Contact a health care provider if:  You have abdominal pain that: ? Gets worse. ? Stays in one area (localizes).  You have a rash.  You have a stiff neck.  You are more irritable than usual.  You are sleepier or more difficult to wake up than usual.  You feel weak or dizzy.  You feel very thirsty.  You have urinated only a small amount of very dark urine over 6-8 hours. Get help right away if:  You have symptoms of severe dehydration.  You cannot drink fluids without vomiting.  Your symptoms get worse with treatment.  You have a fever.  You have a severe headache.  You have vomiting or diarrhea that: ? Gets worse. ? Does not go away.  You have blood or green matter  (bile) in your vomit.  You have blood in your stool. This may cause stool to look black and tarry.  You have not urinated in 6-8 hours.  You faint.  Your heart rate while sitting still is over 100 beats a minute.  You have trouble breathing. This information is not intended to replace advice given to you by your health care provider. Make sure you discuss any questions you have with your health care provider. Document Released: 12/14/2005 Document Revised: 07/10/2016 Document Reviewed: 02/07/2016 [employer] Interactive Patient Education  2018 [employer]  

## 2018-12-09 ENCOUNTER — Inpatient Hospital Stay: Payer: Medicaid Other

## 2018-12-09 LAB — URINE CULTURE: Culture: <10000 — AB

## 2018-12-12 ENCOUNTER — Encounter: Payer: Self-pay | Admitting: Emergency Medicine

## 2018-12-13 LAB — CULTURE, BLOOD (SINGLE)
Culture: NO GROWTH
Culture: NO GROWTH

## 2018-12-15 ENCOUNTER — Telehealth: Payer: Self-pay

## 2018-12-15 ENCOUNTER — Other Ambulatory Visit: Payer: Self-pay | Admitting: Hematology

## 2018-12-15 ENCOUNTER — Telehealth: Payer: Self-pay | Admitting: *Deleted

## 2018-12-15 DIAGNOSIS — C189 Malignant neoplasm of colon, unspecified: Secondary | ICD-10-CM

## 2018-12-15 MED ORDER — OXYCODONE HCL 10 MG PO TABS: 10.0000 mg | ORAL_TABLET | ORAL | 0 refills | Status: DC | PRN

## 2018-12-15 MED FILL — oxyCODONE HCL 10 MG TABS: 10 | 20 days supply | Qty: 120 | Fill #0

## 2018-12-15 NOTE — Telephone Encounter (Signed)
I have refilled his oxycodone.   [name] MD

## 2018-12-15 NOTE — Telephone Encounter (Signed)
Patient requesting pain refill.  Denies further needs or questions at this time.  FYI Scheduling call transfer received.  Jeff [name] needs to discuss pain.  Assessment: "No.  Not having any signs, symptoms or problems related to treatment to triage.  I need a refill for pain medications."  Denies new pain or changes with this call.  Call transferred.

## 2018-12-15 NOTE — Telephone Encounter (Signed)
Spoke with patient concerning his add on infusion for 12/26. Per 12/19 follow up

## 2018-12-16 NOTE — Telephone Encounter (Signed)
Spoke with Jeff [name] to notify of pain refill being sent to [facility] by [name].  No further needs or questions at this time.

## 2018-12-19 ENCOUNTER — Ambulatory Visit (HOSPITAL_COMMUNITY)
Admission: RE | Admit: 2018-12-19 | Discharge: 2018-12-19 | Disposition: A | Discharge status: Home / Self Care | Payer: Medicaid Other | Source: Ambulatory Visit | Attending: Hematology | Admitting: Hematology

## 2018-12-19 DIAGNOSIS — C189 Malignant neoplasm of colon, unspecified: Secondary | ICD-10-CM

## 2018-12-19 MED ORDER — IOHEXOL 300 MG/ML  SOLN
100.0000 mL | Freq: Once | INTRAMUSCULAR | Status: AC | PRN
  Administered 2018-12-19: 100 mL via INTRAVENOUS

## 2018-12-19 MED ORDER — SODIUM CHLORIDE (PF) 0.9 % IJ SOLN
INTRAMUSCULAR | Status: AC
  Filled 2018-12-19: qty 50

## 2018-12-19 NOTE — Progress Notes (Signed)
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 response to chemotherapy or progress on chemo -The patient understands the goal of care is palliative. -I have previously recommendedDNR/DNI, he will think about it.   Plan  -I prescribed protonix for his heartburn -He will increase Xarelto to 15 mg twice daily for 3 weeks, then change to 20 mg once daily -Labs reviewed, good to proceed with chemo and avastin today and continue every 2 weeks  -f/u in 4 weeks with labs and flush -I will order a   shower chair as per his request  No problem-specific Assessment & Plan notes found for this encounter.   No orders of the defined types were placed in this encounter.  All questions were answered. The patient knows to call the clinic with any problems, questions or concerns. No barriers to learning was detected. I spent 20 minutes counseling the patient face to face. The total time spent in the appointment was 25 minutes and more than 50% was on counseling and review of test results  I, Noor Dweik  am acting as scribe for Dr.  .  I have reviewed the above documentation for accuracy and completeness, and I agree with the above.      , MD 12/22/2018  

## 2018-12-21 ENCOUNTER — Telehealth: Payer: Self-pay | Admitting: Hematology

## 2018-12-21 NOTE — Telephone Encounter (Signed)
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 I called pt he is on xarelto 57m daily due to his previous GI bleeding.  He denies any significant chest pain or new dyspnea.  He met him to increase Xarelto to 15 mg twice daily, for 3 weeks, then change to 262mdaily maintenance dose, if he has no significant recurrent GI bleeding.  He voiced good understanding and agrees with the plan.  He is scheduled to see me tomorrow for chemo.  YaTruitt Merle12/25/2019

## 2018-12-22 ENCOUNTER — Inpatient Hospital Stay: Payer: Medicaid Other

## 2018-12-22 ENCOUNTER — Encounter: Payer: Self-pay | Admitting: Hematology

## 2018-12-22 ENCOUNTER — Inpatient Hospital Stay (HOSPITAL_BASED_OUTPATIENT_CLINIC_OR_DEPARTMENT_OTHER): Payer: Medicaid Other | Admitting: Hematology

## 2018-12-22 ENCOUNTER — Other Ambulatory Visit: Payer: Self-pay | Admitting: Hematology

## 2018-12-22 ENCOUNTER — Telehealth: Payer: Self-pay | Admitting: Hematology

## 2018-12-22 VITALS — BP 118/80 | HR 82 | Temp 98.6°F | Resp 17 | Ht 69.0 in | Wt 119.0 lb

## 2018-12-22 DIAGNOSIS — I2699 Other pulmonary embolism without acute cor pulmonale: Secondary | ICD-10-CM | POA: Diagnosis not present

## 2018-12-22 DIAGNOSIS — C186 Malignant neoplasm of descending colon: Secondary | ICD-10-CM

## 2018-12-22 DIAGNOSIS — Z5111 Encounter for antineoplastic chemotherapy: Secondary | ICD-10-CM | POA: Diagnosis not present

## 2018-12-22 DIAGNOSIS — C189 Malignant neoplasm of colon, unspecified: Secondary | ICD-10-CM

## 2018-12-22 DIAGNOSIS — Z79899 Other long term (current) drug therapy: Secondary | ICD-10-CM

## 2018-12-22 DIAGNOSIS — R066 Hiccough: Secondary | ICD-10-CM

## 2018-12-22 DIAGNOSIS — D630 Anemia in neoplastic disease: Secondary | ICD-10-CM

## 2018-12-22 DIAGNOSIS — R12 Heartburn: Secondary | ICD-10-CM

## 2018-12-22 DIAGNOSIS — Z86718 Personal history of other venous thrombosis and embolism: Secondary | ICD-10-CM

## 2018-12-22 DIAGNOSIS — C762 Malignant neoplasm of abdomen: Secondary | ICD-10-CM

## 2018-12-22 DIAGNOSIS — R911 Solitary pulmonary nodule: Secondary | ICD-10-CM

## 2018-12-22 DIAGNOSIS — Z7901 Long term (current) use of anticoagulants: Secondary | ICD-10-CM

## 2018-12-22 DIAGNOSIS — Z933 Colostomy status: Secondary | ICD-10-CM

## 2018-12-22 DIAGNOSIS — C786 Secondary malignant neoplasm of retroperitoneum and peritoneum: Secondary | ICD-10-CM | POA: Diagnosis not present

## 2018-12-22 DIAGNOSIS — D509 Iron deficiency anemia, unspecified: Secondary | ICD-10-CM

## 2018-12-22 LAB — CBC WITH DIFFERENTIAL/PLATELET
Abs Immature Granulocytes: 0.02 10*3/uL (ref 0.00–0.07)
Basophils Absolute: 0.1 10*3/uL (ref 0.0–0.1)
Basophils Relative: 1 %
Eosinophils Absolute: 0.2 10*3/uL (ref 0.0–0.5)
Eosinophils Relative: 3 %
HCT: 34.9 % — ABNORMAL LOW (ref 39.0–52.0)
Hemoglobin: 10.7 g/dL — ABNORMAL LOW (ref 13.0–17.0)
Immature Granulocytes: 0 %
Lymphocytes Relative: 25 %
Lymphs Abs: 1.6 10*3/uL (ref 0.7–4.0)
MCH: 25.7 pg — ABNORMAL LOW (ref 26.0–34.0)
MCHC: 30.7 g/dL (ref 30.0–36.0)
MCV: 83.9 fL (ref 80.0–100.0)
MONOS PCT: 8 %
Monocytes Absolute: 0.5 10*3/uL (ref 0.1–1.0)
Neutro Abs: 4.1 10*3/uL (ref 1.7–7.7)
Neutrophils Relative %: 63 %
Platelets: 224 10*3/uL (ref 150–400)
RBC: 4.16 MIL/uL — ABNORMAL LOW (ref 4.22–5.81)
RDW: 17.6 % — ABNORMAL HIGH (ref 11.5–15.5)
WBC: 6.5 10*3/uL (ref 4.0–10.5)
nRBC: 0 % (ref 0.0–0.2)

## 2018-12-22 LAB — COMPREHENSIVE METABOLIC PANEL
ALT: <6 U/L (ref 0–44)
AST: 13 U/L — ABNORMAL LOW (ref 15–41)
Albumin: 3 g/dL — ABNORMAL LOW (ref 3.5–5.0)
Alkaline Phosphatase: 82 U/L (ref 38–126)
Anion gap: 8 (ref 5–15)
BUN: 5 mg/dL — ABNORMAL LOW (ref 6–20)
CO2: 24 mmol/L (ref 22–32)
Calcium: 9 mg/dL (ref 8.9–10.3)
Chloride: 106 mmol/L (ref 98–111)
Creatinine, Ser: 0.81 mg/dL (ref 0.61–1.24)
GFR calc Af Amer: >60 mL/min (ref >60)
Glucose, Bld: 114 mg/dL — ABNORMAL HIGH (ref 70–99)
Potassium: 3.7 mmol/L (ref 3.5–5.1)
Sodium: 138 mmol/L (ref 135–145)
TOTAL PROTEIN: 8 g/dL (ref 6.5–8.1)
Total Bilirubin: <0.2 mg/dL — ABNORMAL LOW (ref 0.3–1.2)

## 2018-12-22 LAB — TOTAL PROTEIN, URINE DIPSTICK: Protein, ur: NEGATIVE mg/dL

## 2018-12-22 MED ORDER — IRINOTECAN HCL CHEMO INJECTION 100 MG/5ML
120.0000 mg/m2 | Freq: Once | INTRAVENOUS | Status: AC
  Administered 2018-12-22: 200 mg via INTRAVENOUS
  Filled 2018-12-22: qty 10

## 2018-12-22 MED ORDER — ATROPINE SULFATE 1 MG/ML IJ SOLN
INTRAMUSCULAR | Status: AC
  Filled 2018-12-22: qty 1

## 2018-12-22 MED ORDER — DEXAMETHASONE SODIUM PHOSPHATE 10 MG/ML IJ SOLN
10.0000 mg | Freq: Once | INTRAMUSCULAR | Status: AC
  Administered 2018-12-22: 10 mg via INTRAVENOUS

## 2018-12-22 MED ORDER — PANTOPRAZOLE SODIUM 40 MG PO TBEC: 40.0000 mg | DELAYED_RELEASE_TABLET | Freq: Every day | ORAL | 1 refills | Status: DC

## 2018-12-22 MED ORDER — SODIUM CHLORIDE 0.9 % IV SOLN
2200.0000 mg/m2 | INTRAVENOUS | Status: DC
  Administered 2018-12-22: 3650 mg via INTRAVENOUS
  Filled 2018-12-22: qty 73

## 2018-12-22 MED ORDER — PALONOSETRON HCL INJECTION 0.25 MG/5ML
INTRAVENOUS | Status: AC
  Filled 2018-12-22: qty 5

## 2018-12-22 MED ORDER — LEUCOVORIN CALCIUM INJECTION 350 MG
400.0000 mg/m2 | Freq: Once | INTRAVENOUS | Status: AC
  Administered 2018-12-22: 660 mg via INTRAVENOUS
  Filled 2018-12-22: qty 33

## 2018-12-22 MED ORDER — SODIUM CHLORIDE 0.9 % IV SOLN
5.3000 mg/kg | Freq: Once | INTRAVENOUS | Status: AC
  Administered 2018-12-22: 300 mg via INTRAVENOUS
  Filled 2018-12-22: qty 12

## 2018-12-22 MED ORDER — SODIUM CHLORIDE 0.9 % IV SOLN
Freq: Once | INTRAVENOUS | Status: AC
  Administered 2018-12-22: 11:00:00 via INTRAVENOUS
  Filled 2018-12-22: qty 250

## 2018-12-22 MED ORDER — PALONOSETRON HCL INJECTION 0.25 MG/5ML
0.2500 mg | Freq: Once | INTRAVENOUS | Status: AC
  Administered 2018-12-22: 0.25 mg via INTRAVENOUS

## 2018-12-22 MED ORDER — ATROPINE SULFATE 1 MG/ML IJ SOLN
0.5000 mg | Freq: Once | INTRAMUSCULAR | Status: AC | PRN
  Administered 2018-12-22: 0.5 mg via INTRAVENOUS

## 2018-12-22 MED ORDER — DEXAMETHASONE SODIUM PHOSPHATE 10 MG/ML IJ SOLN
INTRAMUSCULAR | Status: AC
  Filled 2018-12-22: qty 1

## 2018-12-22 MED ORDER — SODIUM CHLORIDE 0.9 % IV SOLN
INTRAVENOUS | Status: DC
  Administered 2018-12-22: 12:00:00 via INTRAVENOUS
  Filled 2018-12-22 (×2): qty 250

## 2018-12-22 MED FILL — XARELTO 15 MG TABLET: 15 | 30 days supply | Qty: 30 | Fill #3

## 2018-12-22 MED FILL — PANTOPRAZOLE SOD DR 40 MG T: 40 | 30 days supply | Qty: 30 | Fill #0

## 2018-12-22 NOTE — Telephone Encounter (Signed)
Per 12/26 los scheduled appointment, called and spoke with patient about those scheduled appointmetns.  Per patient request, printed and mailed calendar.

## 2018-12-22 NOTE — Patient Instructions (Signed)
[facility] Discharge Instructions for Patients Receiving Chemotherapy  Today you received the following chemotherapy agents Irinotecan, Leucovorin and Adrucil. To help prevent nausea and vomiting after your treatment, we encourage you to take your nausea medication as directed BUT NO ZOFRAN FOR 3 DAYS AFTER CHEMO.    If you develop nausea and vomiting that is not controlled by your nausea medication, call the clinic.   BELOW ARE SYMPTOMS THAT SHOULD BE REPORTED IMMEDIATELY:  *FEVER GREATER THAN 100.5 F  *CHILLS WITH OR WITHOUT FEVER  NAUSEA AND VOMITING THAT IS NOT CONTROLLED WITH YOUR NAUSEA MEDICATION  *UNUSUAL SHORTNESS OF BREATH  *UNUSUAL BRUISING OR BLEEDING  TENDERNESS IN MOUTH AND THROAT WITH OR WITHOUT PRESENCE OF ULCERS  *URINARY PROBLEMS  *BOWEL PROBLEMS  UNUSUAL RASH Items with * indicate a potential emergency and should be followed up as soon as possible.  Feel free to call the clinic you have any questions or concerns. The clinic phone number is [phone].  Please show the [facility] at check-in to the Emergency Department and triage nurse.

## 2018-12-24 ENCOUNTER — Inpatient Hospital Stay: Payer: Medicaid Other

## 2018-12-24 VITALS — BP 102/70 | HR 90 | Temp 98.4°F | Resp 16

## 2018-12-24 DIAGNOSIS — C189 Malignant neoplasm of colon, unspecified: Secondary | ICD-10-CM

## 2018-12-24 DIAGNOSIS — Z5111 Encounter for antineoplastic chemotherapy: Secondary | ICD-10-CM | POA: Diagnosis not present

## 2018-12-24 MED ORDER — SODIUM CHLORIDE 0.9% FLUSH
10.0000 mL | INTRAVENOUS | Status: DC | PRN
  Administered 2018-12-24: 10 mL
  Filled 2018-12-24: qty 10

## 2018-12-24 MED ORDER — HEPARIN SOD (PORK) LOCK FLUSH 100 UNIT/ML IV SOLN
500.0000 [IU] | Freq: Once | INTRAVENOUS | Status: AC | PRN
  Administered 2018-12-24: 500 [IU]
  Filled 2018-12-24: qty 5

## 2018-12-24 NOTE — Patient Instructions (Signed)

## 2018-12-27 ENCOUNTER — Other Ambulatory Visit: Payer: Self-pay

## 2019-01-02 ENCOUNTER — Inpatient Hospital Stay: Payer: Medicaid Other

## 2019-01-02 ENCOUNTER — Inpatient Hospital Stay: Payer: Medicaid Other | Attending: Hematology | Admitting: Medical

## 2019-01-02 ENCOUNTER — Telehealth: Payer: Self-pay

## 2019-01-02 VITALS — BP 122/82 | HR 98 | Temp 98.6°F | Resp 18 | Ht 69.0 in | Wt 116.9 lb

## 2019-01-02 DIAGNOSIS — K589 Irritable bowel syndrome without diarrhea: Secondary | ICD-10-CM

## 2019-01-02 DIAGNOSIS — Z79899 Other long term (current) drug therapy: Secondary | ICD-10-CM | POA: Insufficient documentation

## 2019-01-02 DIAGNOSIS — C189 Malignant neoplasm of colon, unspecified: Secondary | ICD-10-CM

## 2019-01-02 DIAGNOSIS — Z5111 Encounter for antineoplastic chemotherapy: Secondary | ICD-10-CM | POA: Insufficient documentation

## 2019-01-02 DIAGNOSIS — Z7901 Long term (current) use of anticoagulants: Secondary | ICD-10-CM | POA: Insufficient documentation

## 2019-01-02 DIAGNOSIS — Z86718 Personal history of other venous thrombosis and embolism: Secondary | ICD-10-CM | POA: Diagnosis not present

## 2019-01-02 DIAGNOSIS — D638 Anemia in other chronic diseases classified elsewhere: Secondary | ICD-10-CM

## 2019-01-02 DIAGNOSIS — C762 Malignant neoplasm of abdomen: Secondary | ICD-10-CM

## 2019-01-02 DIAGNOSIS — E46 Unspecified protein-calorie malnutrition: Secondary | ICD-10-CM | POA: Insufficient documentation

## 2019-01-02 DIAGNOSIS — C185 Malignant neoplasm of splenic flexure: Secondary | ICD-10-CM | POA: Diagnosis not present

## 2019-01-02 DIAGNOSIS — G893 Neoplasm related pain (acute) (chronic): Secondary | ICD-10-CM | POA: Diagnosis not present

## 2019-01-02 DIAGNOSIS — E876 Hypokalemia: Secondary | ICD-10-CM

## 2019-01-02 DIAGNOSIS — C786 Secondary malignant neoplasm of retroperitoneum and peritoneum: Secondary | ICD-10-CM | POA: Diagnosis not present

## 2019-01-02 DIAGNOSIS — E45 Retarded development following protein-calorie malnutrition: Secondary | ICD-10-CM

## 2019-01-02 LAB — TOTAL PROTEIN, URINE DIPSTICK: Protein, ur: NEGATIVE mg/dL

## 2019-01-02 MED ORDER — HYOSCYAMINE SULFATE 0.125 MG SL SUBL: 0.1250 mg | SUBLINGUAL_TABLET | Freq: Four times a day (QID) | SUBLINGUAL | 2 refills | Status: DC | PRN

## 2019-01-02 MED ORDER — POTASSIUM CHLORIDE CRYS ER 20 MEQ PO TBCR: 20.0000 meq | EXTENDED_RELEASE_TABLET | Freq: Every day | ORAL | 2 refills | Status: DC

## 2019-01-02 MED FILL — POTASSIUM CHLORIDE CRYS ER: 20 | 30 days supply | Qty: 30 | Fill #0

## 2019-01-02 MED FILL — OSCIMIN SL 0.125 MG TABLET: 0.125 | 7 days supply | Qty: 30 | Fill #0

## 2019-01-02 NOTE — Telephone Encounter (Signed)
Patient's daughter call with patient c/o pain at colostomy site x 3 days, this is new, arranged for patient to be seen this afternoon in [facility].  Patient/daughter are aware of times.

## 2019-01-03 ENCOUNTER — Other Ambulatory Visit: Payer: Self-pay | Admitting: Hematology

## 2019-01-03 DIAGNOSIS — C189 Malignant neoplasm of colon, unspecified: Secondary | ICD-10-CM

## 2019-01-03 LAB — IRON AND TIBC
Iron: 16 ug/dL — ABNORMAL LOW (ref 42–163)
Saturation Ratios: 7 % — ABNORMAL LOW (ref 20–55)
TIBC: 220 ug/dL (ref 202–409)
UIBC: 204 ug/dL (ref 117–376)

## 2019-01-03 LAB — CEA (IN HOUSE-CHCC): CEA (CHCC-In House): 12.04 ng/mL — ABNORMAL HIGH (ref 0.00–5.00)

## 2019-01-03 LAB — FERRITIN: FERRITIN: 226 ng/mL (ref 24–336)

## 2019-01-04 ENCOUNTER — Emergency Department (HOSPITAL_COMMUNITY)
Admission: EM | Admit: 2019-01-04 | Discharge: 2019-01-04 | Disposition: A | Discharge status: Home / Self Care | Payer: Medicaid Other | Attending: Emergency Medicine | Admitting: Emergency Medicine

## 2019-01-04 ENCOUNTER — Other Ambulatory Visit: Payer: Self-pay | Admitting: General Surgery

## 2019-01-04 ENCOUNTER — Inpatient Hospital Stay: Payer: Medicaid Other

## 2019-01-04 ENCOUNTER — Telehealth: Payer: Self-pay

## 2019-01-04 ENCOUNTER — Emergency Department (HOSPITAL_COMMUNITY): Payer: Medicaid Other

## 2019-01-04 ENCOUNTER — Encounter (HOSPITAL_COMMUNITY): Payer: Self-pay | Admitting: Emergency Medicine

## 2019-01-04 ENCOUNTER — Other Ambulatory Visit: Payer: Self-pay

## 2019-01-04 DIAGNOSIS — Z933 Colostomy status: Secondary | ICD-10-CM | POA: Diagnosis not present

## 2019-01-04 DIAGNOSIS — K921 Melena: Secondary | ICD-10-CM | POA: Insufficient documentation

## 2019-01-04 DIAGNOSIS — Z79899 Other long term (current) drug therapy: Secondary | ICD-10-CM | POA: Diagnosis not present

## 2019-01-04 DIAGNOSIS — K9409 Other complications of colostomy: Secondary | ICD-10-CM

## 2019-01-04 HISTORY — DX: Malignant neoplasm of colon, unspecified: C18.9

## 2019-01-04 LAB — COMPREHENSIVE METABOLIC PANEL
ALBUMIN: 3.6 g/dL (ref 3.5–5.0)
ALT: 6 U/L (ref 0–44)
AST: 14 U/L — ABNORMAL LOW (ref 15–41)
Alkaline Phosphatase: 80 U/L (ref 38–126)
Anion gap: 9 (ref 5–15)
BUN: 5 mg/dL — ABNORMAL LOW (ref 6–20)
CO2: 23 mmol/L (ref 22–32)
Calcium: 8.7 mg/dL — ABNORMAL LOW (ref 8.9–10.3)
Chloride: 106 mmol/L (ref 98–111)
Creatinine, Ser: 0.67 mg/dL (ref 0.61–1.24)
GFR calc Af Amer: >60 mL/min (ref >60)
GFR calc non Af Amer: >60 mL/min (ref >60)
Glucose, Bld: 68 mg/dL — ABNORMAL LOW (ref 70–99)
Potassium: 4 mmol/L (ref 3.5–5.1)
Sodium: 138 mmol/L (ref 135–145)
Total Bilirubin: 0.5 mg/dL (ref 0.3–1.2)
Total Protein: 8.4 g/dL — ABNORMAL HIGH (ref 6.5–8.1)

## 2019-01-04 LAB — CBC WITH DIFFERENTIAL/PLATELET
Abs Immature Granulocytes: 0.03 10*3/uL (ref 0.00–0.07)
Basophils Absolute: 0 10*3/uL (ref 0.0–0.1)
Basophils Relative: 1 %
EOS PCT: 2 %
Eosinophils Absolute: 0.1 10*3/uL (ref 0.0–0.5)
HCT: 38.4 % — ABNORMAL LOW (ref 39.0–52.0)
Hemoglobin: 11.5 g/dL — ABNORMAL LOW (ref 13.0–17.0)
Immature Granulocytes: 1 %
Lymphocytes Relative: 24 %
Lymphs Abs: 1.5 10*3/uL (ref 0.7–4.0)
MCH: 25.5 pg — AB (ref 26.0–34.0)
MCHC: 29.9 g/dL — ABNORMAL LOW (ref 30.0–36.0)
MCV: 85.1 fL (ref 80.0–100.0)
MONOS PCT: 8 %
Monocytes Absolute: 0.5 10*3/uL (ref 0.1–1.0)
Neutro Abs: 4.4 10*3/uL (ref 1.7–7.7)
Neutrophils Relative %: 64 %
Platelets: 142 10*3/uL — ABNORMAL LOW (ref 150–400)
RBC: 4.51 MIL/uL (ref 4.22–5.81)
RDW: 17.6 % — ABNORMAL HIGH (ref 11.5–15.5)
WBC: 6.6 10*3/uL (ref 4.0–10.5)
nRBC: 0 % (ref 0.0–0.2)

## 2019-01-04 LAB — URINALYSIS, ROUTINE W REFLEX MICROSCOPIC
Bilirubin Urine: NEGATIVE
Glucose, UA: NEGATIVE mg/dL
Hgb urine dipstick: NEGATIVE
Ketones, ur: NEGATIVE mg/dL
LEUKOCYTES UA: NEGATIVE
Nitrite: NEGATIVE
Protein, ur: NEGATIVE mg/dL
Specific Gravity, Urine: 1.005 (ref 1.005–1.030)
pH: 5 (ref 5.0–8.0)

## 2019-01-04 LAB — TYPE AND SCREEN
ABO/RH(D): O POS
Antibody Screen: NEGATIVE

## 2019-01-04 LAB — ETHANOL: Alcohol, Ethyl (B): 73 mg/dL — ABNORMAL HIGH (ref <10)

## 2019-01-04 MED ORDER — HYDROCODONE-ACETAMINOPHEN 5-325 MG PO TABS: 1.0000 | ORAL_TABLET | Freq: Four times a day (QID) | ORAL | 0 refills | Status: AC | PRN

## 2019-01-04 MED ORDER — IOPAMIDOL (ISOVUE-300) INJECTION 61%
INTRAVENOUS | Status: AC
  Filled 2019-01-04: qty 100

## 2019-01-04 MED ORDER — HEPARIN SOD (PORK) LOCK FLUSH 100 UNIT/ML IV SOLN
500.0000 [IU] | Freq: Once | INTRAVENOUS | Status: AC
  Administered 2019-01-04: 500 [IU]
  Filled 2019-01-04: qty 5

## 2019-01-04 MED ORDER — SODIUM CHLORIDE (PF) 0.9 % IJ SOLN
INTRAMUSCULAR | Status: AC
  Filled 2019-01-04: qty 50

## 2019-01-04 MED ORDER — IOPAMIDOL (ISOVUE-300) INJECTION 61%
100.0000 mL | Freq: Once | INTRAVENOUS | Status: AC | PRN
  Administered 2019-01-04: 80 mL via INTRAVENOUS

## 2019-01-04 MED ORDER — IOHEXOL 300 MG/ML  SOLN
30.0000 mL | Freq: Once | INTRAMUSCULAR | Status: AC | PRN
  Administered 2019-01-04: 30 mL via INTRAVENOUS

## 2019-01-04 MED ORDER — HYDROCODONE-ACETAMINOPHEN 5-325 MG PO TABS
1.0000 | ORAL_TABLET | Freq: Once | ORAL | Status: AC
  Administered 2019-01-04: 1 via ORAL
  Filled 2019-01-04: qty 1

## 2019-01-04 MED FILL — HYDROCODON-APAP 5-325: 5-325 | 2 days supply | Qty: 10 | Fill #0

## 2019-01-04 NOTE — ED Provider Notes (Signed)
I assumed care of this patient from Dr. Florina Ou at 7 am.  Please see their note for further details of Hx, PE.  Briefly patient is a 58 y.o. male who presented with blood in ostomy.   Current plan is to f/u CT scan of ab/pelvis.  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  Otherwise unremarkable scan.  General surgery was consulted and came down to the ED to evaluate the patient given the edema and blood in his ostomy.  They will closely follow the patient outpatient and will likely repeat imaging.  There is no acute indication for any surgical process or intervention at this time.  Patient otherwise with reassuring labs.  Hemoglobin stable.  Vitals stable.  Patient given Norco while in the ED.  Will give short prescription for narcotic pain medicine until he can follow-up with his oncologist.  Discharged from ED in good condition given return precautions.  This chart was dictated using voice recognition software.  Despite best efforts to proofread,  errors can occur which can change the documentation meaning.    Lennice Sites, DO 01/04/19 [phone]
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
## 2019-01-04 NOTE — Progress Notes (Signed)
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 reschedule to next week  -I will hold Avastin due to his recent complaints of bleeding and high risk of thrombosis. -f/u in 3 weeks   2. Acute PE and History of right LE DVT, and extensive LLE DVT.  -He has been on Xarelto 15 mg once daily, which I  increased to twice daily two weeks ago when he had acute PE.  Unfortunately he developed GI bleeding again on high-dose Xarelto, and has been off for 3 to 4 days.  His GI bleeding has resolved, I will restart 15 mg of Xarelto once a day. I also advised him to stay active.   3. 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 Currently on Ferrous Sulfate. Continue. -Labs reviewed, CBC showed Hg 11.5. STABLE  4. Abdominal pain -He has chronic left upper quadrant abdominal pain, secondary to tumor.  He has developed worsening right-sided abdominal pain, probably related to colostomy prolapse  -he uses up to 6 pills of oxycodone '10mg'$  daily for his abdominal pain which has been worsened lately. I discussed increasing MS Contin dose from '15mg'$  to '30mg'$  twice dialy, and reduce oxycodone to 10 mg every 6 hours.  He understands and agrees. -I advised him not to take more than 4 oxycodone pills a day.   5. Weight loss, malnutrition, anorexia, heartburn, fatigue, hiccups  -Related to chemo and cancer. -Currently on Mirtazapine and protonix. Continue and f/u with dietician  6. Diarrhea  -Chemo related.  Currently on Imodium and Lomotil as needed.  7. Goal of care discussion  -We previously discussed Jeff [name] incurable nature of his cancer, and Jeff [name] overall poor prognosis, especially if he does not have good response to chemotherapy or progress on chemo  -Jeff [name] Jeff [name] [name] understands Jeff [name] goal of care is palliative.  -I have previously recommended DNR/DNI, he will think about it.    Plan  -I will increase MS-Contin dose to 30 mg bid, prescription called in  -I refilled oxycodone '10mg'$ , he will take no more than 4 tabs a day  -He doesn't want to have chemo today due to fatigue  -Will cancel Avastin due to his recent PE and bleeding -Labs, flush, and chemo next week -f/u in 3 weeks with labs,flush and chemo  No problem-specific Assessment & Plan notes found for this encounter.   No orders of  Jeff [name] defined types were placed in this encounter.  All questions were answered. Jeff [name] Jeff [name] [name] knows to call Jeff [name] clinic with any problems, questions or concerns. No barriers to learning was detected. I spent 20 minutes counseling Jeff [name] Jeff [name] [name] face to face. Jeff [name] total time spent in Jeff [name] appointment was 25 minutes and more than 50% was on counseling and review of test results  I, Noor Dweik am acting as scribe for Dr. Truitt [name].  I have reviewed Jeff [name] above documentation for accuracy and completeness, and I agree with Jeff [name] above.     Jeff [name] Merle, MD 01/05/2019
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
## 2019-01-04 NOTE — Telephone Encounter (Signed)
Patient's daughter called stating patient is in ED at [facility] due to having a lot of blood coming out of his colostomy bag.  Cancelled today's appointments will reschedule.  [name] made aware.

## 2019-01-04 NOTE — Progress Notes (Signed)
Patient ID: Jeff [name], male   [name]: 18-Jul-1961, 58 y.o.   MRN: [id number]       Subjective: Patient is known to our service from 2018 when he underwent a diverting loop transverse colostomy secondary to a colonic obstruction at splenic flexure secondary to metastatic left colon cancer by Dr. Redmond Pulling.  He is being followed by [name] with oncology.  He noticed over the last several days a little bit of blood in his bag.  He states the left side of his ostomy has been slightly prolapsed for a long while now, but his "right side" proximal side has been working well with no issues.  He noticed the mucosa on the distal side has turned from pink to more red in the last several days and more blood in his bag today.  He presented to the ED for further evaluation.  He denies any abdominal pain, N/V, or difficulty eating.  He admits to some pain at his ostomy site particularly on that distal side. He underwent a CT scan that revealed some left-side mild prolapse and edema with colon cancer of the splenic flexure with extraluminal tumor and stable nodal disease.  We have been asked to see him for further evaluation and recommendations.  Objective: Vital signs in last 24 hours: Temp:  [98.6 F (37 C)] 98.6 F (37 C) (01/08 0440) Pulse Rate:  [96-107] 96 (01/08 0725) Resp:  [17-20] 20 (01/08 0725) BP: (132-139)/(96-98) 139/97 (01/08 0725) SpO2:  [99 %-100 %] 100 % (01/08 0725)    Intake/Output from previous day: No intake/output data recorded. Intake/Output this shift: No intake/output data recorded.  PE: Gen: NAD Heart: regular Lungs: CTAB Abd: soft, NT, ND, +BS, transverse loop colostomy noted with mild tenderness to palpation of the distal barrel of the ostomy.  This distal side does have a small amount of prolapse and some hyperemic changes but no necrosis or true ischemia noted.  He does have some dark old blood mixed with serous fluid in the colostomy pouch.  Both barrels were digitized.  No  definitive tumor was palpated on distal side as much as I could extended my finger given limitations with the appliance still in place.  The proximal side produced plenty of liquid stool while I was present and manipulating his colostomy.  Lab Results:  Recent Labs    01/04/19 0344  WBC 6.6  HGB 11.5*  HCT 38.4*  PLT 142*   BMET Recent Labs    01/04/19 0344  NA 138  K 4.0  CL 106  CO2 23  GLUCOSE 68*  BUN 5*  CREATININE 0.67  CALCIUM 8.7*   PT/INR No results for input(s): LABPROT, INR in the last 72 hours. CMP     Component Value Date/Time   NA 138 01/04/2019 0344   NA 134 (L) 12/27/2017 0820   K 4.0 01/04/2019 0344   K 3.2 (L) 12/27/2017 0820   CL 106 01/04/2019 0344   CO2 23 01/04/2019 0344   CO2 23 12/27/2017 0820   GLUCOSE 68 (L) 01/04/2019 0344   GLUCOSE 135 12/27/2017 0820   BUN 5 (L) 01/04/2019 0344   BUN 11.3 12/27/2017 0820   CREATININE 0.67 01/04/2019 0344   CREATININE 0.9 12/27/2017 0820   CALCIUM 8.7 (L) 01/04/2019 0344   CALCIUM 8.3 (L) 12/27/2017 0820   PROT 8.4 (H) 01/04/2019 0344   PROT 7.8 12/27/2017 0820   ALBUMIN 3.6 01/04/2019 0344   ALBUMIN 3.0 (L) 12/27/2017 0820   AST 14 (  L) 01/04/2019 0344   AST 16 12/27/2017 0820   ALT 6 01/04/2019 0344   ALT 6 12/27/2017 0820   ALKPHOS 80 01/04/2019 0344   ALKPHOS 69 12/27/2017 0820   BILITOT 0.5 01/04/2019 0344   BILITOT 0.52 12/27/2017 0820   GFRNONAA >60 01/04/2019 0344   GFRAA >60 01/04/2019 0344   Lipase     Component Value Date/Time   LIPASE 14 07/29/2017 1122       Studies/Results: Ct Abdomen Pelvis W Contrast  Result Date: 01/04/2019 CLINICAL DATA:  Bleeding colostomy. EXAM: CT ABDOMEN AND PELVIS WITH CONTRAST TECHNIQUE: Multidetector CT imaging of the abdomen and pelvis was performed using the standard protocol following bolus administration of intravenous contrast. CONTRAST:  28mL ISOVUE-300 IOPAMIDOL (ISOVUE-300) INJECTION 61% COMPARISON:  12/19/2018 FINDINGS: Lower chest:  No  contributory findings. Hepatobiliary: No focal liver abnormality.No evidence of biliary obstruction or stone. Pancreas: Stable low-density mass invading the tail. Spleen: Stable low-density mass with dystrophic calcifications at the lower hilum. Extraluminal gas is resolved. Adrenals/Urinary Tract: Negative adrenals. No hydronephrosis or stone. Unremarkable bladder. Stomach/Bowel: Bulky tumor at the splenic flexure with extraluminal extension invading the pancreatic tail and spleen. On axial slices the mass at the level of the flexure measures up to 5.1 cm, stable. Resolved extraluminal gas. Proximal transverse dual-lumen colostomy with interval prolapse. There is an edematous appearance along the left lumen. 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 Vascular/Lymphatic: Left iliac vein stenting with no luminal opacification, chronic . chronic soft tissue density about the upper SMA, presumably adenopathy. Dimensions are 14 mm, stable if not decreased. Reproductive:Negative Other: No ascites or pneumoperitoneum. Musculoskeletal: No acute abnormalities. IMPRESSION: 1. Transverse colostomy with interval prolapse and edema of the left-sided lumen. 2. 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 3. Colon cancer at the splenic flexure with extraluminal tumor. Extraluminal gas has resolved since staging scan 12/19/2018. Stable soft tissue along the SMA, presumably nodal disease. 4. 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 Electronically Signed   By: Monte Fantasia M.D.   On: 01/04/2019 07:29    Anti-infectives: Anti-infectives (From admission, onward)   None       Assessment/Plan Colon cancer, s/p diverting transverse loop colostomy There appears to be some slightly hyperemic changes to the distal side of his  colostomy with minimal prolapse.  This barrel is patent at least as far as can be digitized.  It is unclear why these changes have recently occurred.  The "bleeding" he is noting is likely irritation and some sloughing of the mucosa.  No immediate intervention is warranted as the bowel is not compromised and the ostomy is still functioning well.  I have discussed this with Dr. Harlow Asa who recommends having him get a contrasted study through the distal portion of his ostomy to assure that he is not becoming obstructed more distally contributing to this problem.  We will then have him follow up with Dr. Redmond Pulling in the office who did his surgery.  He is stable for DC home from the ED.  This was all explained to the patient.    LOS: 0 days    Henreitta Cea , [facility] Surgery 01/04/2019, 9:18 AM Pager: [phone]

## 2019-01-04 NOTE — ED Notes (Signed)
Pt aware urine specimen needed

## 2019-01-04 NOTE — ED Triage Notes (Signed)
Pt arriving with blood in stools. Pt reports he noticed this approx 5 days ago. Pt has colostomy.

## 2019-01-05 ENCOUNTER — Inpatient Hospital Stay: Payer: Medicaid Other

## 2019-01-05 ENCOUNTER — Inpatient Hospital Stay (HOSPITAL_BASED_OUTPATIENT_CLINIC_OR_DEPARTMENT_OTHER): Payer: Medicaid Other | Admitting: Hematology

## 2019-01-05 ENCOUNTER — Encounter: Payer: Self-pay | Admitting: Hematology

## 2019-01-05 DIAGNOSIS — D638 Anemia in other chronic diseases classified elsewhere: Secondary | ICD-10-CM | POA: Diagnosis not present

## 2019-01-05 DIAGNOSIS — Z5111 Encounter for antineoplastic chemotherapy: Secondary | ICD-10-CM | POA: Diagnosis not present

## 2019-01-05 DIAGNOSIS — G893 Neoplasm related pain (acute) (chronic): Secondary | ICD-10-CM

## 2019-01-05 DIAGNOSIS — C185 Malignant neoplasm of splenic flexure: Secondary | ICD-10-CM

## 2019-01-05 DIAGNOSIS — C189 Malignant neoplasm of colon, unspecified: Secondary | ICD-10-CM

## 2019-01-05 DIAGNOSIS — C786 Secondary malignant neoplasm of retroperitoneum and peritoneum: Secondary | ICD-10-CM

## 2019-01-05 MED ORDER — MORPHINE SULFATE ER 30 MG PO TBCR: 30.0000 mg | EXTENDED_RELEASE_TABLET | Freq: Two times a day (BID) | ORAL | 0 refills | Status: DC

## 2019-01-05 MED ORDER — OXYCODONE HCL 10 MG PO TABS: 10.0000 mg | ORAL_TABLET | Freq: Four times a day (QID) | ORAL | 0 refills | Status: DC

## 2019-01-05 MED FILL — oxyCODONE HCL 10 MG TABS: 10 | 30 days supply | Qty: 120 | Fill #0

## 2019-01-05 MED FILL — MORPHINE SULF ER 30 MG TAB: 30 | 30 days supply | Qty: 60 | Fill #0

## 2019-01-06 ENCOUNTER — Telehealth: Payer: Self-pay | Admitting: Hematology

## 2019-01-06 NOTE — Telephone Encounter (Signed)
Spoke with patient daughter about scheduled appointments per 01/09 los.  Per patient request print and mail calendar.

## 2019-01-09 MED FILL — DIPHENOXYLATE-ATROPINE 2.5-: 2.5-0.025 | 11 days supply | Qty: 90 | Fill #1

## 2019-01-10 ENCOUNTER — Other Ambulatory Visit: Payer: Self-pay | Admitting: *Deleted

## 2019-01-10 DIAGNOSIS — C189 Malignant neoplasm of colon, unspecified: Secondary | ICD-10-CM

## 2019-01-11 ENCOUNTER — Inpatient Hospital Stay: Payer: Medicaid Other

## 2019-01-11 DIAGNOSIS — Z95828 Presence of other vascular implants and grafts: Secondary | ICD-10-CM

## 2019-01-11 DIAGNOSIS — Z5111 Encounter for antineoplastic chemotherapy: Secondary | ICD-10-CM | POA: Diagnosis not present

## 2019-01-11 DIAGNOSIS — C189 Malignant neoplasm of colon, unspecified: Secondary | ICD-10-CM

## 2019-01-11 LAB — CBC WITH DIFFERENTIAL (CANCER CENTER ONLY)
Abs Immature Granulocytes: 0.01 10*3/uL (ref 0.00–0.07)
Basophils Absolute: 0.1 10*3/uL (ref 0.0–0.1)
Basophils Relative: 1 %
EOS PCT: 3 %
Eosinophils Absolute: 0.2 10*3/uL (ref 0.0–0.5)
HCT: 40.9 % (ref 39.0–52.0)
Hemoglobin: 12.7 g/dL — ABNORMAL LOW (ref 13.0–17.0)
Immature Granulocytes: 0 %
Lymphocytes Relative: 27 %
Lymphs Abs: 1.5 10*3/uL (ref 0.7–4.0)
MCH: 26 pg (ref 26.0–34.0)
MCHC: 31.1 g/dL (ref 30.0–36.0)
MCV: 83.6 fL (ref 80.0–100.0)
Monocytes Absolute: 0.5 10*3/uL (ref 0.1–1.0)
Monocytes Relative: 10 %
Neutro Abs: 3.2 10*3/uL (ref 1.7–7.7)
Neutrophils Relative %: 59 %
Platelet Count: 171 10*3/uL (ref 150–400)
RBC: 4.89 MIL/uL (ref 4.22–5.81)
RDW: 18.6 % — ABNORMAL HIGH (ref 11.5–15.5)
WBC: 5.5 10*3/uL (ref 4.0–10.5)
nRBC: 0 % (ref 0.0–0.2)

## 2019-01-11 LAB — CMP (CANCER CENTER ONLY)
ALT: <6 U/L (ref 0–44)
AST: 11 U/L — ABNORMAL LOW (ref 15–41)
Albumin: 3.3 g/dL — ABNORMAL LOW (ref 3.5–5.0)
Alkaline Phosphatase: 93 U/L (ref 38–126)
Anion gap: 6 (ref 5–15)
BUN: 4 mg/dL — ABNORMAL LOW (ref 6–20)
CO2: 28 mmol/L (ref 22–32)
Calcium: 9.4 mg/dL (ref 8.9–10.3)
Chloride: 104 mmol/L (ref 98–111)
Creatinine: 0.82 mg/dL (ref 0.61–1.24)
GFR, Estimated: >60 mL/min (ref >60)
Glucose, Bld: 115 mg/dL — ABNORMAL HIGH (ref 70–99)
Potassium: 4.4 mmol/L (ref 3.5–5.1)
Sodium: 138 mmol/L (ref 135–145)
Total Bilirubin: 0.2 mg/dL — ABNORMAL LOW (ref 0.3–1.2)
Total Protein: 7.9 g/dL (ref 6.5–8.1)

## 2019-01-11 MED ORDER — SODIUM CHLORIDE 0.9% FLUSH
10.0000 mL | INTRAVENOUS | Status: DC | PRN
  Filled 2019-01-11: qty 10

## 2019-01-11 MED ORDER — HEPARIN SOD (PORK) LOCK FLUSH 100 UNIT/ML IV SOLN
500.0000 [IU] | Freq: Once | INTRAVENOUS | Status: DC
  Filled 2019-01-11: qty 5

## 2019-01-11 NOTE — Progress Notes (Signed)
Per Pt. Peripheral stick done today. Pt. Tolerated well.

## 2019-01-12 ENCOUNTER — Telehealth: Payer: Self-pay | Admitting: Hematology

## 2019-01-12 ENCOUNTER — Other Ambulatory Visit: Payer: Self-pay

## 2019-01-12 ENCOUNTER — Inpatient Hospital Stay: Payer: Medicaid Other

## 2019-01-12 VITALS — BP 117/82 | HR 97 | Temp 98.1°F | Resp 12

## 2019-01-12 DIAGNOSIS — Z5111 Encounter for antineoplastic chemotherapy: Secondary | ICD-10-CM | POA: Diagnosis not present

## 2019-01-12 DIAGNOSIS — C189 Malignant neoplasm of colon, unspecified: Secondary | ICD-10-CM

## 2019-01-12 MED ORDER — PALONOSETRON HCL INJECTION 0.25 MG/5ML
0.2500 mg | Freq: Once | INTRAVENOUS | Status: AC
  Administered 2019-01-12: 0.25 mg via INTRAVENOUS

## 2019-01-12 MED ORDER — LEUCOVORIN CALCIUM INJECTION 350 MG
400.0000 mg/m2 | Freq: Once | INTRAVENOUS | Status: AC
  Administered 2019-01-12: 660 mg via INTRAVENOUS
  Filled 2019-01-12: qty 33

## 2019-01-12 MED ORDER — PALONOSETRON HCL INJECTION 0.25 MG/5ML
INTRAVENOUS | Status: AC
  Filled 2019-01-12: qty 5

## 2019-01-12 MED ORDER — DEXAMETHASONE SODIUM PHOSPHATE 10 MG/ML IJ SOLN
INTRAMUSCULAR | Status: AC
  Filled 2019-01-12: qty 1

## 2019-01-12 MED ORDER — SODIUM CHLORIDE 0.9 % IV SOLN
2000.0000 mg/m2 | INTRAVENOUS | Status: DC
  Administered 2019-01-12: 3300 mg via INTRAVENOUS
  Filled 2019-01-12: qty 66

## 2019-01-12 MED ORDER — DEXAMETHASONE SODIUM PHOSPHATE 10 MG/ML IJ SOLN
10.0000 mg | Freq: Once | INTRAMUSCULAR | Status: AC
  Administered 2019-01-12: 10 mg via INTRAVENOUS

## 2019-01-12 MED ORDER — IRINOTECAN HCL CHEMO INJECTION 100 MG/5ML
120.0000 mg/m2 | Freq: Once | INTRAVENOUS | Status: AC
  Administered 2019-01-12: 200 mg via INTRAVENOUS
  Filled 2019-01-12: qty 10

## 2019-01-12 MED ORDER — SODIUM CHLORIDE 0.9 % IV SOLN
Freq: Once | INTRAVENOUS | Status: AC
  Administered 2019-01-12: 09:00:00 via INTRAVENOUS
  Filled 2019-01-12: qty 250

## 2019-01-12 NOTE — Progress Notes (Signed)
Pt reports that he doesn't want to go home with the pump today due to it makes him sick & has a bad taste in his mouth the whole time it is running.  Informed Dr Burr Medico & she came over & discussed with him.  She states that she will reduce dosage to see if that helps.

## 2019-01-12 NOTE — Addendum Note (Signed)
Addended by: [name] on: 01/12/2019 08:44 AM   Modules accepted: Orders

## 2019-01-12 NOTE — Patient Instructions (Signed)
  [facility] Discharge Instructions for Patients Receiving Chemotherapy  Today you received the following chemotherapy agents:  Leucovorin, camptosar & Fluorouracil  To help prevent nausea and vomiting after your treatment, we encourage you to take your nausea medication as described.    If you develop nausea and vomiting that is not controlled by your nausea medication, call the clinic.   BELOW ARE SYMPTOMS THAT SHOULD BE REPORTED IMMEDIATELY:  *FEVER GREATER THAN 100.5 F  *CHILLS WITH OR WITHOUT FEVER  NAUSEA AND VOMITING THAT IS NOT CONTROLLED WITH YOUR NAUSEA MEDICATION  *UNUSUAL SHORTNESS OF BREATH  *UNUSUAL BRUISING OR BLEEDING  TENDERNESS IN MOUTH AND THROAT WITH OR WITHOUT PRESENCE OF ULCERS  *URINARY PROBLEMS  *BOWEL PROBLEMS  UNUSUAL RASH Items with * indicate a potential emergency and should be followed up as soon as possible.  Feel free to call the clinic should you have any questions or concerns. The clinic phone number is [phone].  Please show the [place] at check-in to the Emergency Department and triage nurse.

## 2019-01-12 NOTE — Telephone Encounter (Signed)
Patient told his infusion nurse that the he does not want 5-FU pump today.  I went to the infusion, and I talked to him.  He is concerned about the nausea, vomiting, and mild sensitive during the pump infusion.  I offered the option of decreased dose, and encouraged him to stay on the same regiment, which seems working.  Unfortunately he does not have many other treatment options if he feels this regimen.  He finally agreed with the dose reduction. Chemo treatment plan updated.  I called and spoke with his daughter, updated her, and answer the question.  She knows to call us if she has any concerns.  [name]  01/12/2019

## 2019-01-13 ENCOUNTER — Inpatient Hospital Stay: Admission: RE | Admit: 2019-01-13 | Payer: Self-pay | Source: Ambulatory Visit

## 2019-01-13 ENCOUNTER — Other Ambulatory Visit: Payer: Self-pay

## 2019-01-13 ENCOUNTER — Ambulatory Visit: Payer: Self-pay

## 2019-01-14 ENCOUNTER — Inpatient Hospital Stay: Payer: Medicaid Other

## 2019-01-14 VITALS — BP 118/82 | HR 92 | Temp 98.2°F | Resp 16

## 2019-01-14 DIAGNOSIS — C189 Malignant neoplasm of colon, unspecified: Secondary | ICD-10-CM

## 2019-01-14 DIAGNOSIS — Z5111 Encounter for antineoplastic chemotherapy: Secondary | ICD-10-CM | POA: Diagnosis not present

## 2019-01-14 MED ORDER — SODIUM CHLORIDE 0.9% FLUSH
10.0000 mL | INTRAVENOUS | Status: DC | PRN
  Administered 2019-01-14: 10 mL
  Filled 2019-01-14: qty 10

## 2019-01-14 MED ORDER — HEPARIN SOD (PORK) LOCK FLUSH 100 UNIT/ML IV SOLN
500.0000 [IU] | Freq: Once | INTRAVENOUS | Status: AC | PRN
  Administered 2019-01-14: 500 [IU]
  Filled 2019-01-14: qty 5

## 2019-01-16 ENCOUNTER — Other Ambulatory Visit: Payer: Self-pay | Admitting: General Surgery

## 2019-01-16 ENCOUNTER — Ambulatory Visit
Admission: RE | Admit: 2019-01-16 | Discharge: 2019-01-16 | Disposition: A | Discharge status: Home / Self Care | Payer: Medicaid Other | Source: Ambulatory Visit | Attending: General Surgery | Admitting: General Surgery

## 2019-01-16 DIAGNOSIS — K9409 Other complications of colostomy: Secondary | ICD-10-CM

## 2019-01-19 ENCOUNTER — Other Ambulatory Visit: Payer: Self-pay

## 2019-01-19 ENCOUNTER — Ambulatory Visit: Payer: Self-pay

## 2019-01-19 ENCOUNTER — Ambulatory Visit: Payer: Self-pay | Admitting: Nurse Practitioner

## 2019-01-26 ENCOUNTER — Other Ambulatory Visit: Payer: Self-pay

## 2019-01-26 ENCOUNTER — Inpatient Hospital Stay: Payer: Medicaid Other

## 2019-01-26 ENCOUNTER — Encounter: Payer: Self-pay | Admitting: Nurse Practitioner

## 2019-01-26 ENCOUNTER — Ambulatory Visit: Payer: Medicaid Other

## 2019-01-26 ENCOUNTER — Inpatient Hospital Stay (HOSPITAL_BASED_OUTPATIENT_CLINIC_OR_DEPARTMENT_OTHER): Payer: Medicaid Other | Admitting: Nurse Practitioner

## 2019-01-26 DIAGNOSIS — G893 Neoplasm related pain (acute) (chronic): Secondary | ICD-10-CM

## 2019-01-26 DIAGNOSIS — D638 Anemia in other chronic diseases classified elsewhere: Secondary | ICD-10-CM | POA: Diagnosis not present

## 2019-01-26 DIAGNOSIS — Z79899 Other long term (current) drug therapy: Secondary | ICD-10-CM

## 2019-01-26 DIAGNOSIS — C185 Malignant neoplasm of splenic flexure: Secondary | ICD-10-CM

## 2019-01-26 DIAGNOSIS — C786 Secondary malignant neoplasm of retroperitoneum and peritoneum: Secondary | ICD-10-CM | POA: Diagnosis not present

## 2019-01-26 DIAGNOSIS — C189 Malignant neoplasm of colon, unspecified: Secondary | ICD-10-CM

## 2019-01-26 DIAGNOSIS — Z86718 Personal history of other venous thrombosis and embolism: Secondary | ICD-10-CM

## 2019-01-26 DIAGNOSIS — E46 Unspecified protein-calorie malnutrition: Secondary | ICD-10-CM

## 2019-01-26 DIAGNOSIS — Z5111 Encounter for antineoplastic chemotherapy: Secondary | ICD-10-CM | POA: Diagnosis not present

## 2019-01-26 DIAGNOSIS — E876 Hypokalemia: Secondary | ICD-10-CM

## 2019-01-26 DIAGNOSIS — C762 Malignant neoplasm of abdomen: Secondary | ICD-10-CM

## 2019-01-26 DIAGNOSIS — Z7901 Long term (current) use of anticoagulants: Secondary | ICD-10-CM

## 2019-01-26 LAB — CMP (CANCER CENTER ONLY)
ALK PHOS: 90 U/L (ref 38–126)
ALT: <6 U/L (ref 0–44)
AST: 12 U/L — ABNORMAL LOW (ref 15–41)
Albumin: 3.3 g/dL — ABNORMAL LOW (ref 3.5–5.0)
Anion gap: 8 (ref 5–15)
BUN: 6 mg/dL (ref 6–20)
CO2: 25 mmol/L (ref 22–32)
Calcium: 8.8 mg/dL — ABNORMAL LOW (ref 8.9–10.3)
Chloride: 106 mmol/L (ref 98–111)
Creatinine: 0.8 mg/dL (ref 0.61–1.24)
GFR, Est AFR Am: >60 mL/min (ref >60)
GFR, Estimated: >60 mL/min (ref >60)
Glucose, Bld: 70 mg/dL (ref 70–99)
Potassium: 4.3 mmol/L (ref 3.5–5.1)
Sodium: 139 mmol/L (ref 135–145)
Total Bilirubin: <0.2 mg/dL — ABNORMAL LOW (ref 0.3–1.2)
Total Protein: 7.5 g/dL (ref 6.5–8.1)

## 2019-01-26 LAB — CEA (IN HOUSE-CHCC): CEA (CHCC-In House): 12.82 ng/mL — ABNORMAL HIGH (ref 0.00–5.00)

## 2019-01-26 LAB — CBC WITH DIFFERENTIAL (CANCER CENTER ONLY)
Abs Immature Granulocytes: 0.02 10*3/uL (ref 0.00–0.07)
Basophils Absolute: 0 10*3/uL (ref 0.0–0.1)
Basophils Relative: 1 %
EOS PCT: 2 %
Eosinophils Absolute: 0.1 10*3/uL (ref 0.0–0.5)
HEMATOCRIT: 37.4 % — AB (ref 39.0–52.0)
HEMOGLOBIN: 11.5 g/dL — AB (ref 13.0–17.0)
Immature Granulocytes: 0 %
Lymphocytes Relative: 20 %
Lymphs Abs: 1.2 10*3/uL (ref 0.7–4.0)
MCH: 26.1 pg (ref 26.0–34.0)
MCHC: 30.7 g/dL (ref 30.0–36.0)
MCV: 85 fL (ref 80.0–100.0)
MONO ABS: 0.6 10*3/uL (ref 0.1–1.0)
Monocytes Relative: 10 %
Neutro Abs: 3.8 10*3/uL (ref 1.7–7.7)
Neutrophils Relative %: 67 %
Platelet Count: 141 10*3/uL — ABNORMAL LOW (ref 150–400)
RBC: 4.4 MIL/uL (ref 4.22–5.81)
RDW: 18.8 % — ABNORMAL HIGH (ref 11.5–15.5)
WBC Count: 5.7 10*3/uL (ref 4.0–10.5)
nRBC: 0 % (ref 0.0–0.2)

## 2019-01-26 LAB — FERRITIN: Ferritin: 137 ng/mL (ref 24–336)

## 2019-01-26 MED ORDER — IRINOTECAN HCL CHEMO INJECTION 100 MG/5ML
120.0000 mg/m2 | Freq: Once | INTRAVENOUS | Status: AC
  Administered 2019-01-26: 200 mg via INTRAVENOUS
  Filled 2019-01-26: qty 10

## 2019-01-26 MED ORDER — ATROPINE SULFATE 1 MG/ML IJ SOLN
0.5000 mg | Freq: Once | INTRAMUSCULAR | Status: AC | PRN
  Administered 2019-01-26: 0.5 mg via INTRAVENOUS

## 2019-01-26 MED ORDER — ATROPINE SULFATE 1 MG/ML IJ SOLN
INTRAMUSCULAR | Status: AC
  Filled 2019-01-26: qty 1

## 2019-01-26 MED ORDER — GABAPENTIN 300 MG PO CAPS: 300.0000 mg | ORAL_CAPSULE | Freq: Three times a day (TID) | ORAL | 0 refills | Status: DC

## 2019-01-26 MED ORDER — SODIUM CHLORIDE 0.9 % IV SOLN
Freq: Once | INTRAVENOUS | Status: AC
  Administered 2019-01-26: 12:00:00 via INTRAVENOUS
  Filled 2019-01-26: qty 250

## 2019-01-26 MED ORDER — SODIUM CHLORIDE 0.9 % IV SOLN
2000.0000 mg/m2 | INTRAVENOUS | Status: DC
  Administered 2019-01-26: 3300 mg via INTRAVENOUS
  Filled 2019-01-26: qty 66

## 2019-01-26 MED ORDER — PALONOSETRON HCL INJECTION 0.25 MG/5ML
0.2500 mg | Freq: Once | INTRAVENOUS | Status: AC
  Administered 2019-01-26: 0.25 mg via INTRAVENOUS

## 2019-01-26 MED ORDER — PALONOSETRON HCL INJECTION 0.25 MG/5ML
INTRAVENOUS | Status: AC
  Filled 2019-01-26: qty 5

## 2019-01-26 MED ORDER — LEUCOVORIN CALCIUM INJECTION 350 MG
400.0000 mg/m2 | Freq: Once | INTRAVENOUS | Status: AC
  Administered 2019-01-26: 660 mg via INTRAVENOUS
  Filled 2019-01-26: qty 33

## 2019-01-26 MED ORDER — DEXAMETHASONE SODIUM PHOSPHATE 10 MG/ML IJ SOLN
10.0000 mg | Freq: Once | INTRAMUSCULAR | Status: AC
  Administered 2019-01-26: 10 mg via INTRAVENOUS

## 2019-01-26 MED ORDER — SODIUM CHLORIDE 0.9 % IV SOLN
Freq: Once | INTRAVENOUS | Status: AC
  Administered 2019-01-26: 11:00:00 via INTRAVENOUS
  Filled 2019-01-26: qty 250

## 2019-01-26 MED ORDER — DEXAMETHASONE SODIUM PHOSPHATE 10 MG/ML IJ SOLN
INTRAMUSCULAR | Status: AC
  Filled 2019-01-26: qty 1

## 2019-01-26 MED FILL — GABAPENTIN 300 MG CAPSULE: 300 | 30 days supply | Qty: 90 | Fill #0

## 2019-01-26 NOTE — Patient Instructions (Signed)
  [facility] Discharge Instructions for Patients Receiving Chemotherapy  Today you received the following chemotherapy agents:  Leucovorin, camptosar & Fluorouracil  To help prevent nausea and vomiting after your treatment, we encourage you to take your nausea medication as described.    If you develop nausea and vomiting that is not controlled by your nausea medication, call the clinic.   BELOW ARE SYMPTOMS THAT SHOULD BE REPORTED IMMEDIATELY:  *FEVER GREATER THAN 100.5 F  *CHILLS WITH OR WITHOUT FEVER  NAUSEA AND VOMITING THAT IS NOT CONTROLLED WITH YOUR NAUSEA MEDICATION  *UNUSUAL SHORTNESS OF BREATH  *UNUSUAL BRUISING OR BLEEDING  TENDERNESS IN MOUTH AND THROAT WITH OR WITHOUT PRESENCE OF ULCERS  *URINARY PROBLEMS  *BOWEL PROBLEMS  UNUSUAL RASH Items with * indicate a potential emergency and should be followed up as soon as possible.  Feel free to call the clinic should you have any questions or concerns. The clinic phone number is [phone].  Please show the [facility] at check-in to the Emergency Department and triage nurse.

## 2019-01-26 NOTE — Progress Notes (Signed)
  [facility] OFFICE PROGRESS NOTE   Diagnosis: Metastatic colon cancer  CURRENT THERAPY FOLFIRI q2weeksrestarted on8/30/19, Avastin added on 09/08/2018, stopped after 12/22/2018 due to acute PE and GI bleeding  INTERVAL HISTORY:   [name] returns as scheduled.  He completed another cycle of FOLFIRI 01/12/2019.  He denies nausea/vomiting.  No mouth sores.  No diarrhea.  No bleeding.  No shortness of breath.  No hand or foot pain or redness.  He notes palms are dry.  He applies lotion liberally.  He reports a good appetite.  He reports left abdominal and right leg pain controlled with a combination of MS Contin and oxycodone as needed.  Objective:  Vital signs in last 24 hours:  Blood pressure 110/77, pulse 85, temperature 98.3 F (36.8 C), temperature source Oral, resp. rate 18, height _0  (1.753 m), weight 117 lb 8 oz (53.3 kg), SpO2 100 %.    HEENT: No thrush or ulcers. Resp: Lungs clear bilaterally. Cardio: Regular rate and rhythm. GI: Abdomen soft and nontender.  No hepatomegaly.  Colostomy bag with green stool. Vascular: No leg edema.  Right lower leg is larger than the left lower leg. Skin: Palms with hyperpigmentation, dry appearing. Port-A-Cath without erythema.   Lab Results:  Lab Results  Component Value Date   WBC 5.5 01/11/2019   HGB 12.7 (L) 01/11/2019   HCT 40.9 01/11/2019   MCV 83.6 01/11/2019   PLT 171 01/11/2019   NEUTROABS 3.2 01/11/2019    Imaging:  No results found.  Medications: I have reviewed the patient's current medications.  Assessment/Plan: 1. 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 2. Acute PE and history of right lower extremity DVT, extensive left lower extremity DVT currently on Xarelto 15 mg daily 3. Anemia 4. Abdominal pain secondary to #1 5. Weight loss, malnutrition,  anorexia, heartburn, fatigue, hiccups related to chemotherapy and cancer  Disposition: Mr. Bonczek appears stable.  He seems to be tolerating the chemotherapy well.  Plan to continue FOLFIRI every 2 weeks.   CBC from today reviewed.  Counts adequate for treatment.  Chemistry panel pending.  He will return for lab, follow-up and FOLFIRI in 2 weeks.  He will contact the office in the interim with any problems.  Plan reviewed with Dr. Harold Barban ANP/GNP-BC   01/26/2019  10:40 AM

## 2019-01-28 ENCOUNTER — Inpatient Hospital Stay: Payer: Medicaid Other | Attending: Hematology

## 2019-01-28 VITALS — BP 117/80 | HR 98 | Temp 98.0°F | Resp 18

## 2019-01-28 DIAGNOSIS — G893 Neoplasm related pain (acute) (chronic): Secondary | ICD-10-CM | POA: Insufficient documentation

## 2019-01-28 DIAGNOSIS — C185 Malignant neoplasm of splenic flexure: Secondary | ICD-10-CM | POA: Diagnosis not present

## 2019-01-28 DIAGNOSIS — Z79899 Other long term (current) drug therapy: Secondary | ICD-10-CM | POA: Insufficient documentation

## 2019-01-28 DIAGNOSIS — E46 Unspecified protein-calorie malnutrition: Secondary | ICD-10-CM | POA: Diagnosis not present

## 2019-01-28 DIAGNOSIS — Z86718 Personal history of other venous thrombosis and embolism: Secondary | ICD-10-CM | POA: Diagnosis not present

## 2019-01-28 DIAGNOSIS — C786 Secondary malignant neoplasm of retroperitoneum and peritoneum: Secondary | ICD-10-CM | POA: Diagnosis not present

## 2019-01-28 DIAGNOSIS — D638 Anemia in other chronic diseases classified elsewhere: Secondary | ICD-10-CM | POA: Diagnosis not present

## 2019-01-28 DIAGNOSIS — E876 Hypokalemia: Secondary | ICD-10-CM | POA: Diagnosis not present

## 2019-01-28 DIAGNOSIS — Z7901 Long term (current) use of anticoagulants: Secondary | ICD-10-CM | POA: Diagnosis not present

## 2019-01-28 DIAGNOSIS — C189 Malignant neoplasm of colon, unspecified: Secondary | ICD-10-CM

## 2019-01-28 MED ORDER — HEPARIN SOD (PORK) LOCK FLUSH 100 UNIT/ML IV SOLN
500.0000 [IU] | Freq: Once | INTRAVENOUS | Status: AC | PRN
  Administered 2019-01-28: 500 [IU]
  Filled 2019-01-28: qty 5

## 2019-01-28 MED ORDER — SODIUM CHLORIDE 0.9% FLUSH
10.0000 mL | INTRAVENOUS | Status: DC | PRN
  Administered 2019-01-28: 10 mL
  Filled 2019-01-28: qty 10

## 2019-01-31 MED ORDER — DIPHENHYDRAMINE HCL 25 MG PO CAPS
ORAL_CAPSULE | ORAL | Status: AC
  Filled 2019-01-31: qty 2

## 2019-01-31 MED ORDER — ACETAMINOPHEN 325 MG PO TABS
ORAL_TABLET | ORAL | Status: AC
  Filled 2019-01-31: qty 2

## 2019-02-01 ENCOUNTER — Encounter (HOSPITAL_COMMUNITY): Payer: Self-pay | Admitting: Emergency Medicine

## 2019-02-01 ENCOUNTER — Emergency Department (HOSPITAL_COMMUNITY)
Admission: EM | Admit: 2019-02-01 | Discharge: 2019-02-01 | Disposition: A | Discharge status: Home / Self Care | Payer: Medicaid Other | Attending: Emergency Medicine | Admitting: Emergency Medicine

## 2019-02-01 ENCOUNTER — Emergency Department (HOSPITAL_COMMUNITY): Payer: Medicaid Other

## 2019-02-01 ENCOUNTER — Telehealth: Payer: Self-pay

## 2019-02-01 DIAGNOSIS — S52532A Colles' fracture of left radius, initial encounter for closed fracture: Secondary | ICD-10-CM | POA: Diagnosis not present

## 2019-02-01 DIAGNOSIS — Y999 Unspecified external cause status: Secondary | ICD-10-CM | POA: Insufficient documentation

## 2019-02-01 DIAGNOSIS — Z7901 Long term (current) use of anticoagulants: Secondary | ICD-10-CM | POA: Diagnosis not present

## 2019-02-01 DIAGNOSIS — S6992XA Unspecified injury of left wrist, hand and finger(s), initial encounter: Secondary | ICD-10-CM | POA: Diagnosis present

## 2019-02-01 DIAGNOSIS — X509XXA Other and unspecified overexertion or strenuous movements or postures, initial encounter: Secondary | ICD-10-CM | POA: Insufficient documentation

## 2019-02-01 DIAGNOSIS — Y9289 Other specified places as the place of occurrence of the external cause: Secondary | ICD-10-CM | POA: Insufficient documentation

## 2019-02-01 DIAGNOSIS — C189 Malignant neoplasm of colon, unspecified: Secondary | ICD-10-CM | POA: Insufficient documentation

## 2019-02-01 DIAGNOSIS — Z79899 Other long term (current) drug therapy: Secondary | ICD-10-CM | POA: Diagnosis not present

## 2019-02-01 DIAGNOSIS — T148XXA Other injury of unspecified body region, initial encounter: Secondary | ICD-10-CM

## 2019-02-01 DIAGNOSIS — Y9389 Activity, other specified: Secondary | ICD-10-CM | POA: Diagnosis not present

## 2019-02-01 MED ORDER — HEPARIN SOD (PORK) LOCK FLUSH 100 UNIT/ML IV SOLN
500.0000 [IU] | Freq: Once | INTRAVENOUS | Status: AC | PRN
  Administered 2019-02-01: 500 [IU]
  Filled 2019-02-01: qty 5

## 2019-02-01 MED ORDER — ETOMIDATE 2 MG/ML IV SOLN
0.2000 mg/kg | Freq: Once | INTRAVENOUS | Status: AC
  Administered 2019-02-01: 10.66 mg via INTRAVENOUS
  Filled 2019-02-01: qty 10

## 2019-02-01 MED ORDER — LIDOCAINE-EPINEPHRINE (PF) 2 %-1:200000 IJ SOLN
20.0000 mL | Freq: Once | INTRAMUSCULAR | Status: AC
  Administered 2019-02-01: 20 mL
  Filled 2019-02-01: qty 20

## 2019-02-01 MED ORDER — HYDROMORPHONE HCL 1 MG/ML IJ SOLN
1.0000 mg | Freq: Once | INTRAMUSCULAR | Status: AC
  Administered 2019-02-01: 1 mg via INTRAVENOUS
  Filled 2019-02-01: qty 1

## 2019-02-01 NOTE — Discharge Instructions (Addendum)
Please call the orthopedic hand surgeon for follow up
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
## 2019-02-01 NOTE — ED Triage Notes (Addendum)
Patient here from home with complaints of left wrist pain and injury after fall down stairs with a refrigerator. Deformity noted. Bleeding controlled.

## 2019-02-01 NOTE — Telephone Encounter (Signed)
Faxed signed order back to [facility] for shower chair, sent to HIM for scanning.

## 2019-02-02 ENCOUNTER — Other Ambulatory Visit: Payer: Self-pay

## 2019-02-02 ENCOUNTER — Other Ambulatory Visit: Payer: Self-pay | Admitting: Nurse Practitioner

## 2019-02-02 ENCOUNTER — Ambulatory Visit: Payer: Self-pay

## 2019-02-02 ENCOUNTER — Ambulatory Visit: Payer: Self-pay | Admitting: Hematology

## 2019-02-02 ENCOUNTER — Other Ambulatory Visit: Payer: Self-pay | Admitting: Hematology

## 2019-02-02 DIAGNOSIS — C189 Malignant neoplasm of colon, unspecified: Secondary | ICD-10-CM

## 2019-02-02 MED ORDER — OXYCODONE HCL 10 MG PO TABS: 10.0000 mg | ORAL_TABLET | Freq: Four times a day (QID) | ORAL | 0 refills | Status: DC

## 2019-02-02 MED ORDER — MORPHINE SULFATE ER 30 MG PO TBCR: 30.0000 mg | EXTENDED_RELEASE_TABLET | Freq: Two times a day (BID) | ORAL | 0 refills | Status: DC

## 2019-02-02 MED ORDER — DIPHENOXYLATE-ATROPINE 2.5-0.025 MG PO TABS: 2.0000 | ORAL_TABLET | Freq: Four times a day (QID) | ORAL | 1 refills | Status: DC | PRN

## 2019-02-02 MED FILL — MORPHINE SULF ER 30 MG TAB: 30 | 30 days supply | Qty: 60 | Fill #0

## 2019-02-02 MED FILL — OSCIMIN SL 0.125 MG TABLET: 0.125 | 7 days supply | Qty: 30 | Fill #1

## 2019-02-02 MED FILL — DIPHENOXYLATE-ATROPINE 2.5-: 2.5-0.025 | 12 days supply | Qty: 90 | Fill #0

## 2019-02-02 MED FILL — oxyCODONE HCL 10 MG TABS: 10 | 30 days supply | Qty: 120 | Fill #0

## 2019-02-02 MED FILL — PANTOPRAZOLE SOD DR 40 MG T: 40 | 30 days supply | Qty: 30 | Fill #1

## 2019-02-02 MED FILL — CHLORPROMAZINE 10 MG TABLET: 10 | 10 days supply | Qty: 30 | Fill #1

## 2019-02-02 MED FILL — POTASSIUM CHLORIDE CRYS ER: 20 | 30 days supply | Qty: 30 | Fill #1

## 2019-02-02 MED FILL — MIRTAZAPINE 15 MG TABLET: 15 | 30 days supply | Qty: 30 | Fill #2

## 2019-02-07 NOTE — Progress Notes (Signed)
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 Imodium and Lomotil as needed. -His diarrhea is overall controlled in the mild.  7. Goal of care discussion  -We previously discussed the incurable nature of his cancer, and the overall poor prognosis, especially if he does not have good response to chemotherapy or progress on chemo  -The patient understands the goal of care is palliative.  -I have previously recommended DNR/DNI, he will think about  it.    Plan  - I will hold chemotherapy today due to his left wrist fracture  - I will copy his orthopaedic surgeon Dr. Caralyn Guile - Lab, flush, f/u and chemo FOLFIRI in 3 weeks   No problem-specific Assessment & Plan notes found for this encounter.   No orders of the defined types were placed in this encounter.  All questions were answered. The patient knows to call the clinic with any problems, questions or concerns. No barriers to learning was detected. I spent 20 minutes counseling the patient face to face. The total time spent in the appointment was 25 minutes and more than 50% was on counseling and review of test  results  I, Manson Allan am acting as scribe for Dr. [name].  I have reviewed the above documentation for accuracy and completeness, and I agree with the above.     [name], MD 02/09/2019

## 2019-02-09 ENCOUNTER — Inpatient Hospital Stay: Payer: Medicaid Other

## 2019-02-09 ENCOUNTER — Inpatient Hospital Stay (HOSPITAL_BASED_OUTPATIENT_CLINIC_OR_DEPARTMENT_OTHER): Payer: Medicaid Other | Admitting: Hematology

## 2019-02-09 ENCOUNTER — Telehealth: Payer: Self-pay | Admitting: Hematology

## 2019-02-09 VITALS — BP 129/75 | HR 110 | Temp 98.2°F | Resp 17 | Ht 69.0 in | Wt 123.1 lb

## 2019-02-09 DIAGNOSIS — D638 Anemia in other chronic diseases classified elsewhere: Secondary | ICD-10-CM

## 2019-02-09 DIAGNOSIS — G893 Neoplasm related pain (acute) (chronic): Secondary | ICD-10-CM | POA: Diagnosis not present

## 2019-02-09 DIAGNOSIS — Z79899 Other long term (current) drug therapy: Secondary | ICD-10-CM

## 2019-02-09 DIAGNOSIS — C189 Malignant neoplasm of colon, unspecified: Secondary | ICD-10-CM

## 2019-02-09 DIAGNOSIS — C762 Malignant neoplasm of abdomen: Secondary | ICD-10-CM

## 2019-02-09 DIAGNOSIS — E46 Unspecified protein-calorie malnutrition: Secondary | ICD-10-CM

## 2019-02-09 DIAGNOSIS — Z86718 Personal history of other venous thrombosis and embolism: Secondary | ICD-10-CM

## 2019-02-09 DIAGNOSIS — Z7901 Long term (current) use of anticoagulants: Secondary | ICD-10-CM

## 2019-02-09 DIAGNOSIS — C185 Malignant neoplasm of splenic flexure: Secondary | ICD-10-CM

## 2019-02-09 DIAGNOSIS — C786 Secondary malignant neoplasm of retroperitoneum and peritoneum: Secondary | ICD-10-CM

## 2019-02-09 DIAGNOSIS — Z95828 Presence of other vascular implants and grafts: Secondary | ICD-10-CM

## 2019-02-09 DIAGNOSIS — E876 Hypokalemia: Secondary | ICD-10-CM

## 2019-02-09 DIAGNOSIS — D500 Iron deficiency anemia secondary to blood loss (chronic): Secondary | ICD-10-CM

## 2019-02-09 DIAGNOSIS — D509 Iron deficiency anemia, unspecified: Secondary | ICD-10-CM

## 2019-02-09 DIAGNOSIS — D630 Anemia in neoplastic disease: Secondary | ICD-10-CM

## 2019-02-09 LAB — COMPREHENSIVE METABOLIC PANEL
ALBUMIN: 3.1 g/dL — AB (ref 3.5–5.0)
ALT: <6 U/L (ref 0–44)
ANION GAP: 9 (ref 5–15)
AST: 13 U/L — ABNORMAL LOW (ref 15–41)
Alkaline Phosphatase: 92 U/L (ref 38–126)
BUN: 8 mg/dL (ref 6–20)
CO2: 26 mmol/L (ref 22–32)
Calcium: 8.1 mg/dL — ABNORMAL LOW (ref 8.9–10.3)
Chloride: 99 mmol/L (ref 98–111)
Creatinine, Ser: 0.78 mg/dL (ref 0.61–1.24)
GFR calc Af Amer: >60 mL/min (ref >60)
GFR calc non Af Amer: >60 mL/min (ref >60)
Glucose, Bld: 115 mg/dL — ABNORMAL HIGH (ref 70–99)
Potassium: 3.8 mmol/L (ref 3.5–5.1)
Sodium: 134 mmol/L — ABNORMAL LOW (ref 135–145)
Total Bilirubin: 0.5 mg/dL (ref 0.3–1.2)
Total Protein: 6.8 g/dL (ref 6.5–8.1)

## 2019-02-09 LAB — CEA (IN HOUSE-CHCC): CEA (CHCC-In House): 13.75 ng/mL — ABNORMAL HIGH (ref 0.00–5.00)

## 2019-02-09 LAB — IRON AND TIBC
Iron: 9 ug/dL — ABNORMAL LOW (ref 42–163)
SATURATION RATIOS: 4 % — AB (ref 20–55)
TIBC: 216 ug/dL (ref 202–409)
UIBC: 207 ug/dL (ref 117–376)

## 2019-02-09 LAB — FERRITIN: Ferritin: 250 ng/mL (ref 24–336)

## 2019-02-09 MED ORDER — HEPARIN SOD (PORK) LOCK FLUSH 100 UNIT/ML IV SOLN
500.0000 [IU] | Freq: Once | INTRAVENOUS | Status: AC
  Administered 2019-02-09: 500 [IU] via INTRAVENOUS
  Filled 2019-02-09: qty 5

## 2019-02-09 MED ORDER — RIVAROXABAN 20 MG PO TABS: 20.0000 mg | ORAL_TABLET | Freq: Every day | ORAL | 1 refills | Status: DC

## 2019-02-09 MED ORDER — ALTEPLASE 2 MG IJ SOLR
2.0000 mg | Freq: Once | INTRAMUSCULAR | Status: DC | PRN
  Filled 2019-02-09: qty 2

## 2019-02-09 MED ORDER — SODIUM CHLORIDE 0.9% FLUSH
10.0000 mL | INTRAVENOUS | Status: DC | PRN
  Administered 2019-02-09: 10 mL via INTRAVENOUS
  Filled 2019-02-09: qty 10

## 2019-02-09 MED FILL — XARELTO 20 MG TABLET: 20 | 30 days supply | Qty: 30 | Fill #0

## 2019-02-09 NOTE — Telephone Encounter (Signed)
Scheduled appt per 2/13 los.  Added treatment in the book for approval.  Will call patient once treatment has been added with appt date and time.

## 2019-02-11 ENCOUNTER — Encounter: Payer: Self-pay | Admitting: Hematology

## 2019-02-11 IMAGING — CT CT ABD-PELV W/ CM
2 of 5 series · 15 of 46 positions shown, 17 images · IV contrast (iopamidol)
Comparison: 11/25/2017 CT and prior studies

CLINICAL DATA: 56-year-old male with abdominal, left pelvic and
left leg pain and swelling for several days. History of stage IV
colon cancer and currently on chemotherapy.

EXAM:
CT ABDOMEN AND PELVIS WITH CONTRAST
TECHNIQUE: Multidetector CT imaging of the abdomen and pelvis was performed
using the standard protocol following bolus administration of
intravenous contrast.
CONTRAST:  100mL XQYI94-[id number] IOPAMIDOL (XQYI94-WAA) INJECTION 61%

[Series 2: axial st · axial · 0.75mm/px · z∈[-390,+26]mm · 12 of 97 slices shown, 14 images]
[im 7/97  soft-tissue]
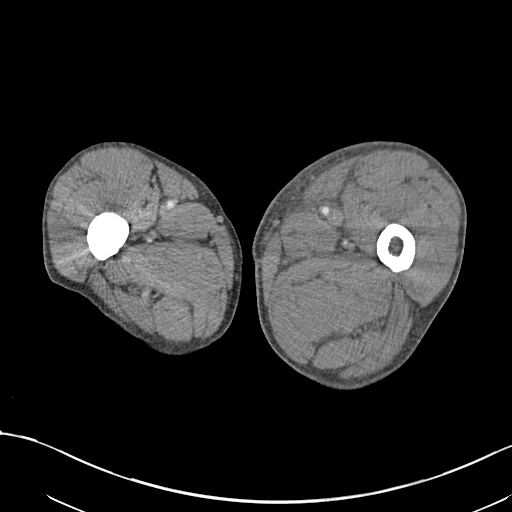
[im 7/97  bone]
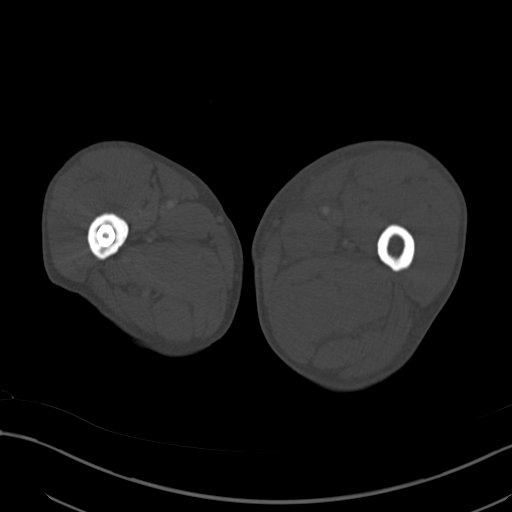
[im 14/97  soft-tissue]
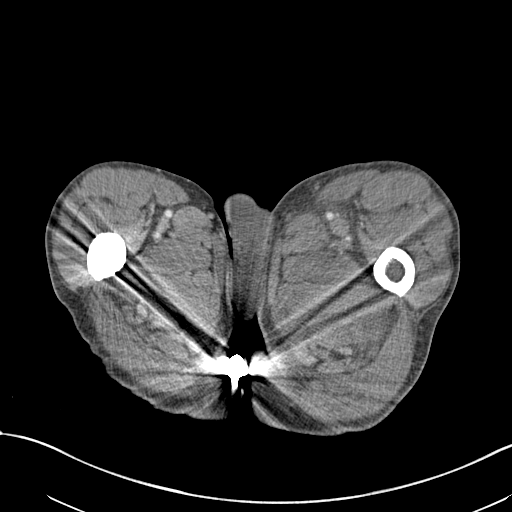
[im 21/97  soft-tissue]
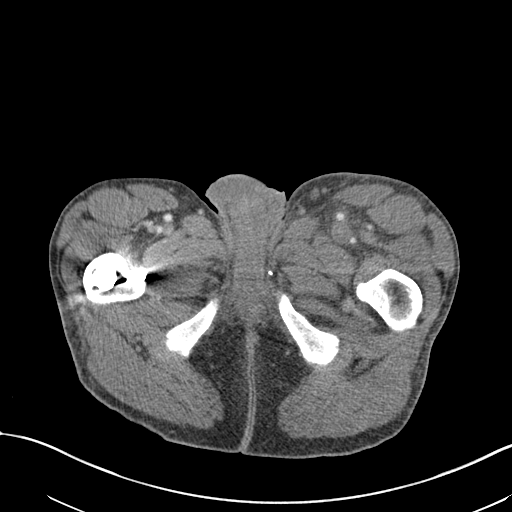
[im 28/97  soft-tissue]
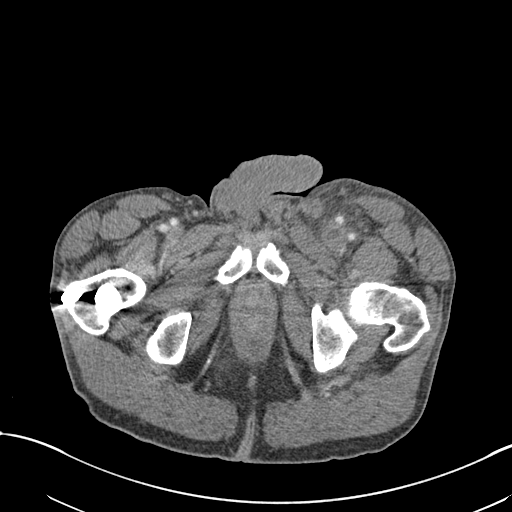
[im 35/97  soft-tissue]
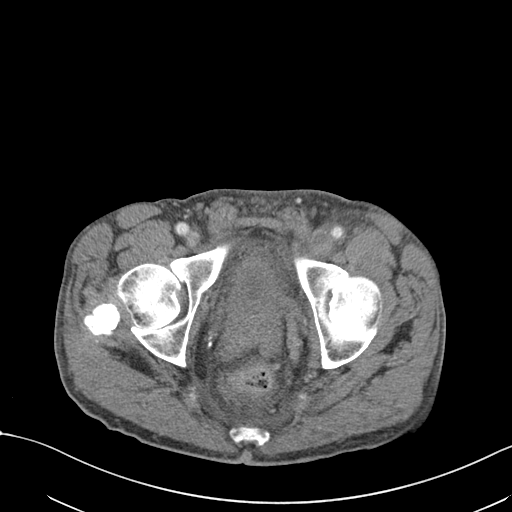
[im 42/97  soft-tissue]
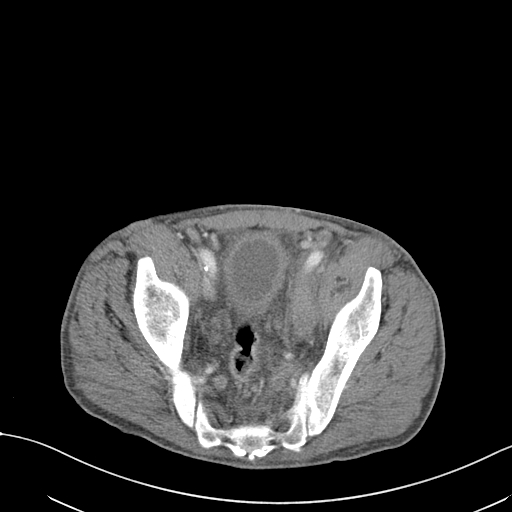
[im 55/97  soft-tissue]
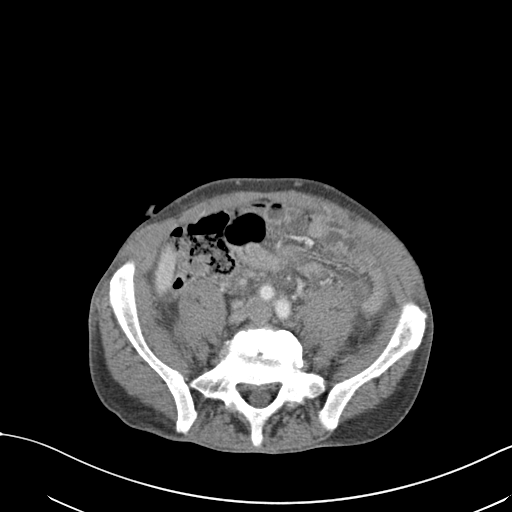
[im 62/97  soft-tissue]
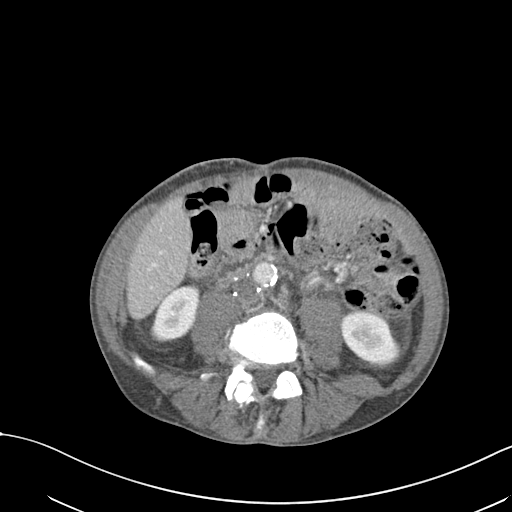
[im 69/97  soft-tissue]
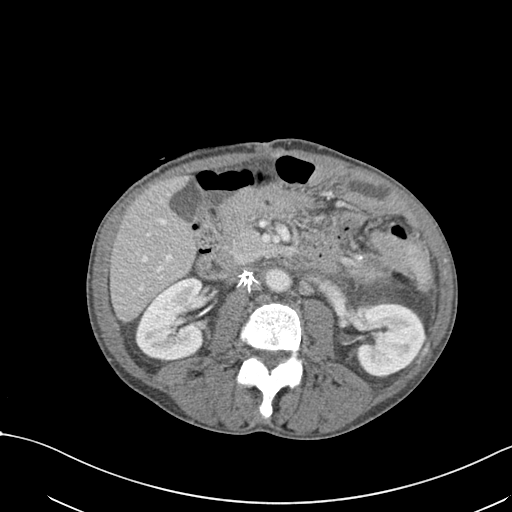
[im 69/97  bone]
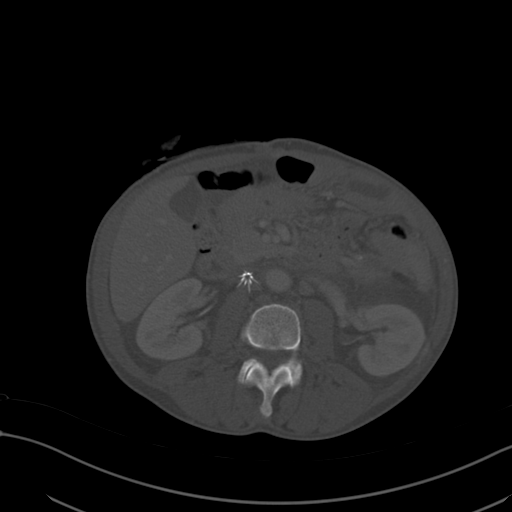
[im 76/97  soft-tissue]
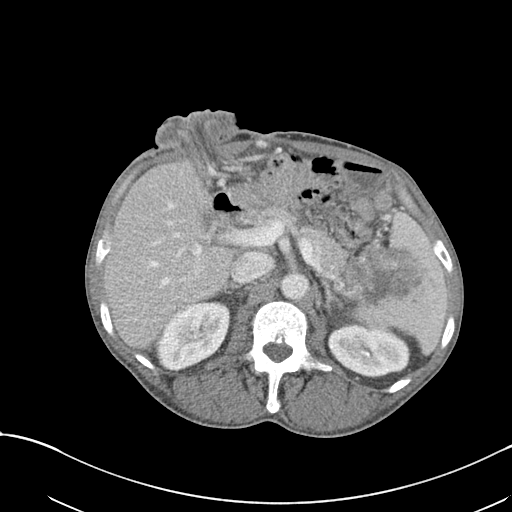
[im 83/97  soft-tissue]
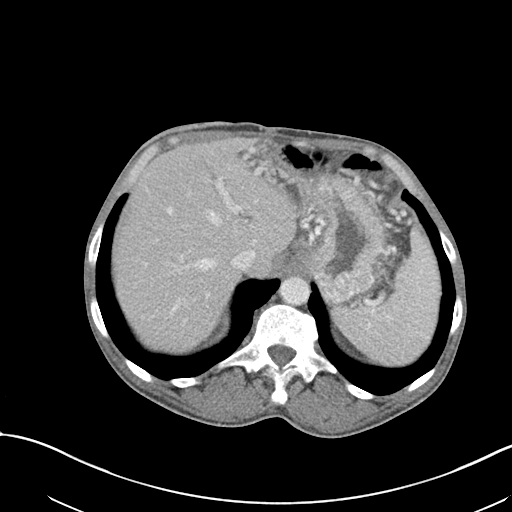
[im 90/97  soft-tissue]
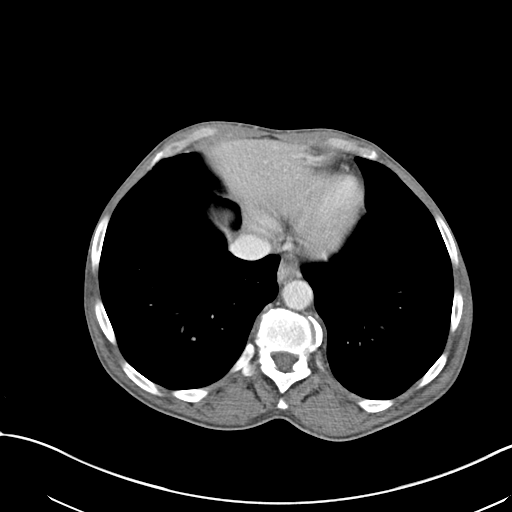

[Series 5: coronal st · coronal · 0.69mm/px · 3 of 92 slices shown]
[im 31/92  soft-tissue]
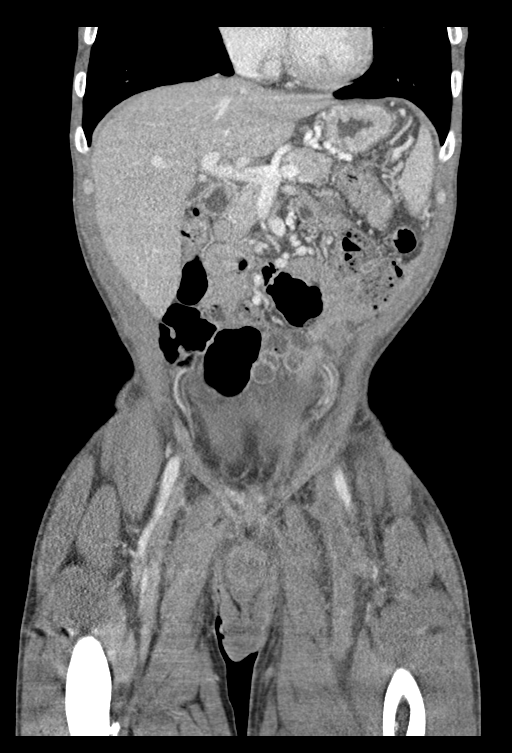
[im 41/92  soft-tissue]
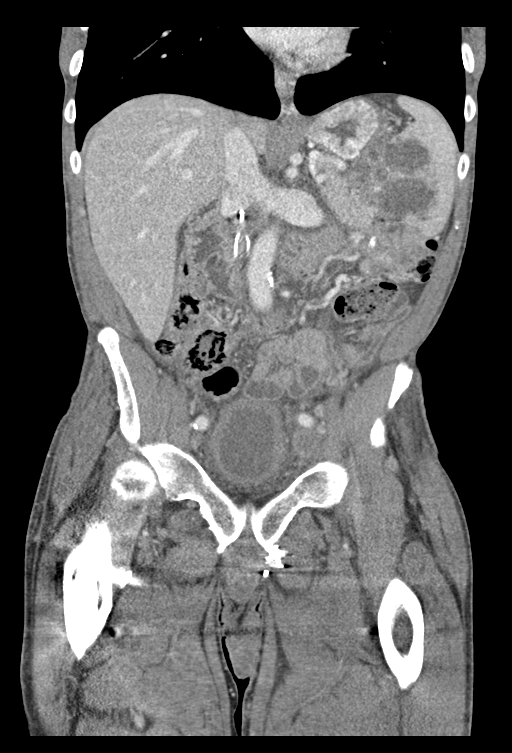
[im 51/92  soft-tissue]
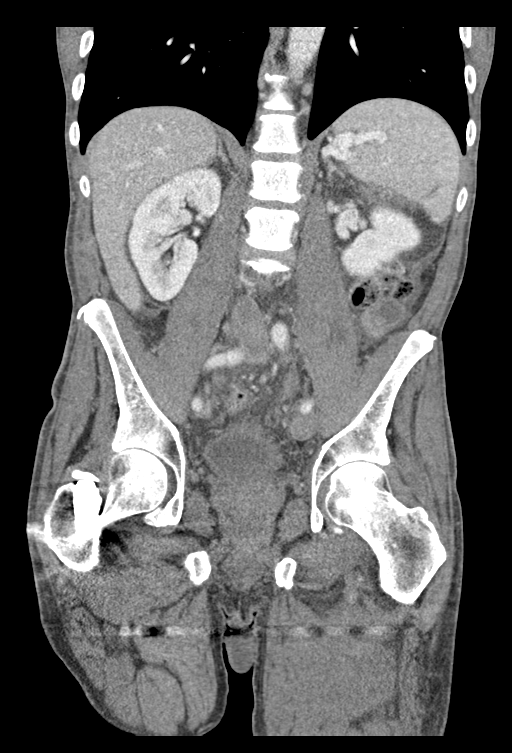

[15 of 46 positions shown; findings below may reference images not displayed]

FINDINGS: Lower chest: No acute abnormality.

Hepatobiliary: No significant hepatic or gallbladder abnormalities.
No biliary dilatation.

Pancreas: Unremarkable

Spleen: A 4.2 x 5.9 cm complex cystic mass in the splenic hilum is
unchanged. No new abnormalities identified.

Adrenals/Urinary Tract: The kidneys, adrenal glands and bladder are
unremarkable except for mild circumferential bladder wall
thickening.

Stomach/Bowel: A right colostomy is again identified. Wall
thickening of the colon at the splenic flexure is unchanged. No
bowel obstruction, other definite bowel wall thickening or
inflammatory changes.

Vascular/Lymphatic: An infrarenal IVC filter is noted with thrombus
in the IVC, common iliac veins, left iliac and femoral veins. There
is enlargement and inflammation along the left iliac and common
femoral vein since the prior study.

Aortic atherosclerotic calcifications noted without aneurysm. No
definite enlarged lymph nodes.

Reproductive: No definite prostate abnormality

Other: Stranding in the subcutaneous and mesenteric fat noted. A
trace amount of free fluid within the pelvis is noted. No
pneumoperitoneum or definite abscess.

Musculoskeletal: No acute abnormality or suspicious bony lesion.
IMPRESSION: 1. IVC filter with IVC and iliac thrombus again noted. However,
there is now increasing thrombus within the left iliac and femoral
veins with adjacent inflammation.
2. Mild circumferential bladder wall thickening which may be
reactive or could indicate cystitis. Correlate clinically.
3. Unchanged complex cystic mass in the splenic hilum and focal wall
thickening of the splenic flexure.
4.  Aortic Atherosclerosis ([id number]).

## 2019-02-12 ENCOUNTER — Other Ambulatory Visit: Payer: Self-pay | Admitting: Hematology

## 2019-02-14 ENCOUNTER — Telehealth: Payer: Self-pay | Admitting: Hematology

## 2019-02-14 ENCOUNTER — Telehealth: Payer: Self-pay

## 2019-02-14 ENCOUNTER — Other Ambulatory Visit: Payer: Self-pay

## 2019-02-14 ENCOUNTER — Encounter (HOSPITAL_COMMUNITY): Payer: Self-pay | Admitting: Emergency Medicine

## 2019-02-14 DIAGNOSIS — I82502 Chronic embolism and thrombosis of unspecified deep veins of left lower extremity: Secondary | ICD-10-CM | POA: Diagnosis present

## 2019-02-14 DIAGNOSIS — G893 Neoplasm related pain (acute) (chronic): Secondary | ICD-10-CM | POA: Diagnosis present

## 2019-02-14 DIAGNOSIS — S62102D Fracture of unspecified carpal bone, left wrist, subsequent encounter for fracture with routine healing: Secondary | ICD-10-CM

## 2019-02-14 DIAGNOSIS — Z79899 Other long term (current) drug therapy: Secondary | ICD-10-CM

## 2019-02-14 DIAGNOSIS — E860 Dehydration: Secondary | ICD-10-CM | POA: Diagnosis present

## 2019-02-14 DIAGNOSIS — R64 Cachexia: Secondary | ICD-10-CM | POA: Diagnosis present

## 2019-02-14 DIAGNOSIS — K631 Perforation of intestine (nontraumatic): Principal | ICD-10-CM | POA: Diagnosis present

## 2019-02-14 DIAGNOSIS — I82891 Chronic embolism and thrombosis of other specified veins: Secondary | ICD-10-CM | POA: Diagnosis present

## 2019-02-14 DIAGNOSIS — E43 Unspecified severe protein-calorie malnutrition: Secondary | ICD-10-CM | POA: Diagnosis present

## 2019-02-14 DIAGNOSIS — Z7901 Long term (current) use of anticoagulants: Secondary | ICD-10-CM

## 2019-02-14 DIAGNOSIS — Z681 Body mass index (BMI) 19 or less, adult: Secondary | ICD-10-CM

## 2019-02-14 DIAGNOSIS — I82522 Chronic embolism and thrombosis of left iliac vein: Secondary | ICD-10-CM | POA: Diagnosis present

## 2019-02-14 DIAGNOSIS — Z86711 Personal history of pulmonary embolism: Secondary | ICD-10-CM

## 2019-02-14 DIAGNOSIS — D6481 Anemia due to antineoplastic chemotherapy: Secondary | ICD-10-CM | POA: Diagnosis present

## 2019-02-14 DIAGNOSIS — E871 Hypo-osmolality and hyponatremia: Secondary | ICD-10-CM | POA: Diagnosis present

## 2019-02-14 DIAGNOSIS — Z933 Colostomy status: Secondary | ICD-10-CM

## 2019-02-14 DIAGNOSIS — D630 Anemia in neoplastic disease: Secondary | ICD-10-CM | POA: Diagnosis present

## 2019-02-14 DIAGNOSIS — R748 Abnormal levels of other serum enzymes: Secondary | ICD-10-CM | POA: Diagnosis present

## 2019-02-14 DIAGNOSIS — D509 Iron deficiency anemia, unspecified: Secondary | ICD-10-CM | POA: Diagnosis present

## 2019-02-14 DIAGNOSIS — Z9049 Acquired absence of other specified parts of digestive tract: Secondary | ICD-10-CM

## 2019-02-14 DIAGNOSIS — E876 Hypokalemia: Secondary | ICD-10-CM | POA: Diagnosis present

## 2019-02-14 DIAGNOSIS — Z9221 Personal history of antineoplastic chemotherapy: Secondary | ICD-10-CM

## 2019-02-14 DIAGNOSIS — I864 Gastric varices: Secondary | ICD-10-CM | POA: Diagnosis present

## 2019-02-14 DIAGNOSIS — C7889 Secondary malignant neoplasm of other digestive organs: Secondary | ICD-10-CM | POA: Diagnosis present

## 2019-02-14 DIAGNOSIS — Z809 Family history of malignant neoplasm, unspecified: Secondary | ICD-10-CM

## 2019-02-14 DIAGNOSIS — Z85038 Personal history of other malignant neoplasm of large intestine: Secondary | ICD-10-CM

## 2019-02-14 NOTE — ED Triage Notes (Signed)
Patient c/o abdominal pain and dark urine x2 days. Hx colon cancer mets. Denies N/V. States last treatment approximately 3 weeks ago. Patient has a colostomy.

## 2019-02-14 NOTE — Telephone Encounter (Signed)
-----   Message from [name], MD sent at 02/12/2019 10:44 AM EST ----- Please let pt know his iron level is low, encourage him to take oral iron pill 1-2 tabs daily, thanks   [name]  02/12/2019

## 2019-02-14 NOTE — Telephone Encounter (Signed)
Spoke with patient's daughter [name] regarding lab resutls, iron level is low, per [name], he is currently taking one iron pill a day, instructed to go up to two a day.  She verbalized an understanding.

## 2019-02-14 NOTE — Telephone Encounter (Signed)
Called patient to inform the patient that his treatment has been added for 3/5.  Patient aware of appt date and time.

## 2019-02-15 ENCOUNTER — Encounter (HOSPITAL_COMMUNITY): Payer: Self-pay

## 2019-02-15 ENCOUNTER — Other Ambulatory Visit: Payer: Self-pay

## 2019-02-15 ENCOUNTER — Inpatient Hospital Stay (HOSPITAL_COMMUNITY)
Admission: EM | Admit: 2019-02-15 | Discharge: 2019-02-20 | DRG: 393 | Disposition: A | Discharge status: Home / Self Care | Payer: Medicaid Other | Attending: Internal Medicine | Admitting: Internal Medicine

## 2019-02-15 ENCOUNTER — Emergency Department (HOSPITAL_COMMUNITY): Payer: Medicaid Other

## 2019-02-15 DIAGNOSIS — K631 Perforation of intestine (nontraumatic): Secondary | ICD-10-CM | POA: Diagnosis present

## 2019-02-15 DIAGNOSIS — E43 Unspecified severe protein-calorie malnutrition: Secondary | ICD-10-CM | POA: Diagnosis present

## 2019-02-15 DIAGNOSIS — Z95828 Presence of other vascular implants and grafts: Secondary | ICD-10-CM

## 2019-02-15 DIAGNOSIS — D509 Iron deficiency anemia, unspecified: Secondary | ICD-10-CM | POA: Diagnosis present

## 2019-02-15 DIAGNOSIS — Z7901 Long term (current) use of anticoagulants: Secondary | ICD-10-CM | POA: Diagnosis not present

## 2019-02-15 DIAGNOSIS — Z86718 Personal history of other venous thrombosis and embolism: Secondary | ICD-10-CM | POA: Diagnosis not present

## 2019-02-15 DIAGNOSIS — Z933 Colostomy status: Secondary | ICD-10-CM | POA: Diagnosis not present

## 2019-02-15 DIAGNOSIS — R64 Cachexia: Secondary | ICD-10-CM | POA: Diagnosis present

## 2019-02-15 DIAGNOSIS — I82522 Chronic embolism and thrombosis of left iliac vein: Secondary | ICD-10-CM | POA: Diagnosis present

## 2019-02-15 DIAGNOSIS — I825Z2 Chronic embolism and thrombosis of unspecified deep veins of left distal lower extremity: Secondary | ICD-10-CM | POA: Diagnosis not present

## 2019-02-15 DIAGNOSIS — Z85038 Personal history of other malignant neoplasm of large intestine: Secondary | ICD-10-CM | POA: Diagnosis not present

## 2019-02-15 DIAGNOSIS — R531 Weakness: Secondary | ICD-10-CM | POA: Diagnosis not present

## 2019-02-15 DIAGNOSIS — S62102D Fracture of unspecified carpal bone, left wrist, subsequent encounter for fracture with routine healing: Secondary | ICD-10-CM | POA: Diagnosis not present

## 2019-02-15 DIAGNOSIS — Z79899 Other long term (current) drug therapy: Secondary | ICD-10-CM | POA: Diagnosis not present

## 2019-02-15 DIAGNOSIS — I959 Hypotension, unspecified: Secondary | ICD-10-CM | POA: Diagnosis not present

## 2019-02-15 DIAGNOSIS — C189 Malignant neoplasm of colon, unspecified: Secondary | ICD-10-CM | POA: Diagnosis not present

## 2019-02-15 DIAGNOSIS — D630 Anemia in neoplastic disease: Secondary | ICD-10-CM | POA: Diagnosis present

## 2019-02-15 DIAGNOSIS — E871 Hypo-osmolality and hyponatremia: Secondary | ICD-10-CM | POA: Diagnosis present

## 2019-02-15 DIAGNOSIS — R109 Unspecified abdominal pain: Secondary | ICD-10-CM | POA: Diagnosis not present

## 2019-02-15 DIAGNOSIS — E860 Dehydration: Secondary | ICD-10-CM | POA: Diagnosis present

## 2019-02-15 DIAGNOSIS — E876 Hypokalemia: Secondary | ICD-10-CM | POA: Diagnosis present

## 2019-02-15 DIAGNOSIS — G893 Neoplasm related pain (acute) (chronic): Secondary | ICD-10-CM | POA: Diagnosis present

## 2019-02-15 DIAGNOSIS — R509 Fever, unspecified: Secondary | ICD-10-CM

## 2019-02-15 DIAGNOSIS — Z809 Family history of malignant neoplasm, unspecified: Secondary | ICD-10-CM | POA: Diagnosis not present

## 2019-02-15 DIAGNOSIS — I82891 Chronic embolism and thrombosis of other specified veins: Secondary | ICD-10-CM | POA: Diagnosis present

## 2019-02-15 DIAGNOSIS — R748 Abnormal levels of other serum enzymes: Secondary | ICD-10-CM | POA: Diagnosis present

## 2019-02-15 DIAGNOSIS — Z66 Do not resuscitate: Secondary | ICD-10-CM | POA: Diagnosis not present

## 2019-02-15 DIAGNOSIS — I864 Gastric varices: Secondary | ICD-10-CM | POA: Diagnosis present

## 2019-02-15 DIAGNOSIS — D6481 Anemia due to antineoplastic chemotherapy: Secondary | ICD-10-CM | POA: Diagnosis present

## 2019-02-15 DIAGNOSIS — I82502 Chronic embolism and thrombosis of unspecified deep veins of left lower extremity: Secondary | ICD-10-CM | POA: Diagnosis present

## 2019-02-15 DIAGNOSIS — Z86711 Personal history of pulmonary embolism: Secondary | ICD-10-CM | POA: Diagnosis not present

## 2019-02-15 DIAGNOSIS — C7889 Secondary malignant neoplasm of other digestive organs: Secondary | ICD-10-CM | POA: Diagnosis present

## 2019-02-15 DIAGNOSIS — Z681 Body mass index (BMI) 19 or less, adult: Secondary | ICD-10-CM | POA: Diagnosis not present

## 2019-02-15 DIAGNOSIS — C799 Secondary malignant neoplasm of unspecified site: Secondary | ICD-10-CM | POA: Diagnosis not present

## 2019-02-15 LAB — URINALYSIS, ROUTINE W REFLEX MICROSCOPIC
Bilirubin Urine: NEGATIVE
Glucose, UA: NEGATIVE mg/dL
Hgb urine dipstick: NEGATIVE
Ketones, ur: NEGATIVE mg/dL
Leukocytes,Ua: NEGATIVE
Nitrite: NEGATIVE
Protein, ur: NEGATIVE mg/dL
Specific Gravity, Urine: 1.025 (ref 1.005–1.030)
pH: 5 (ref 5.0–8.0)

## 2019-02-15 LAB — CBC
HCT: 28.5 % — ABNORMAL LOW (ref 39.0–52.0)
Hemoglobin: 8.6 g/dL — ABNORMAL LOW (ref 13.0–17.0)
MCH: 25.5 pg — ABNORMAL LOW (ref 26.0–34.0)
MCHC: 30.2 g/dL (ref 30.0–36.0)
MCV: 84.6 fL (ref 80.0–100.0)
Platelets: 159 10*3/uL (ref 150–400)
RBC: 3.37 MIL/uL — ABNORMAL LOW (ref 4.22–5.81)
RDW: 18.7 % — ABNORMAL HIGH (ref 11.5–15.5)
WBC: 7.7 10*3/uL (ref 4.0–10.5)
nRBC: 0 % (ref 0.0–0.2)

## 2019-02-15 LAB — COMPREHENSIVE METABOLIC PANEL
ALT: 15 U/L (ref 0–44)
ANION GAP: 7 (ref 5–15)
AST: 25 U/L (ref 15–41)
Albumin: 2.6 g/dL — ABNORMAL LOW (ref 3.5–5.0)
Alkaline Phosphatase: 165 U/L — ABNORMAL HIGH (ref 38–126)
BUN: 10 mg/dL (ref 6–20)
CO2: 25 mmol/L (ref 22–32)
Calcium: 8.7 mg/dL — ABNORMAL LOW (ref 8.9–10.3)
Chloride: 102 mmol/L (ref 98–111)
Creatinine, Ser: 0.88 mg/dL (ref 0.61–1.24)
GFR calc Af Amer: >60 mL/min (ref >60)
GFR calc non Af Amer: >60 mL/min (ref >60)
GLUCOSE: 107 mg/dL — AB (ref 70–99)
Potassium: 3.7 mmol/L (ref 3.5–5.1)
Sodium: 134 mmol/L — ABNORMAL LOW (ref 135–145)
Total Bilirubin: 0.5 mg/dL (ref 0.3–1.2)
Total Protein: 7 g/dL (ref 6.5–8.1)

## 2019-02-15 LAB — LIPASE, BLOOD: Lipase: 28 U/L (ref 11–51)

## 2019-02-15 LAB — LACTIC ACID, PLASMA
Lactic Acid, Venous: 0.8 mmol/L (ref 0.5–1.9)
Lactic Acid, Venous: 0.7 mmol/L (ref 0.5–1.9)

## 2019-02-15 LAB — OCCULT BLOOD X 1 CARD TO LAB, STOOL: Fecal Occult Bld: NEGATIVE

## 2019-02-15 MED ORDER — IOPAMIDOL (ISOVUE-300) INJECTION 61%
100.0000 mL | Freq: Once | INTRAVENOUS | Status: AC | PRN
  Administered 2019-02-15: 100 mL via INTRAVENOUS

## 2019-02-15 MED ORDER — MORPHINE SULFATE (PF) 2 MG/ML IV SOLN
2.0000 mg | INTRAVENOUS | Status: DC | PRN
  Administered 2019-02-16: 2 mg via INTRAVENOUS
  Filled 2019-02-15 (×2): qty 1

## 2019-02-15 MED ORDER — SODIUM CHLORIDE 0.9 % IV SOLN
INTRAVENOUS | Status: DC
  Administered 2019-02-15: 13:00:00 via INTRAVENOUS

## 2019-02-15 MED ORDER — PANTOPRAZOLE SODIUM 40 MG PO TBEC
40.0000 mg | DELAYED_RELEASE_TABLET | Freq: Every day | ORAL | Status: DC
  Administered 2019-02-15: 40 mg via ORAL
  Filled 2019-02-15: qty 1

## 2019-02-15 MED ORDER — SODIUM CHLORIDE 0.9 % IV SOLN
2.0000 g | Freq: Once | INTRAVENOUS | Status: AC
  Administered 2019-02-15: 2 g via INTRAVENOUS
  Filled 2019-02-15: qty 20

## 2019-02-15 MED ORDER — ACETAMINOPHEN 325 MG PO TABS
650.0000 mg | ORAL_TABLET | Freq: Four times a day (QID) | ORAL | Status: DC | PRN
  Administered 2019-02-15 – 2019-02-16 (×2): 650 mg via ORAL
  Filled 2019-02-15 (×2): qty 2

## 2019-02-15 MED ORDER — SODIUM CHLORIDE 0.9 % IV SOLN
2.0000 g | INTRAVENOUS | Status: DC
  Administered 2019-02-16 – 2019-02-20 (×5): 2 g via INTRAVENOUS
  Filled 2019-02-15 (×5): qty 2

## 2019-02-15 MED ORDER — METRONIDAZOLE IN NACL 5-0.79 MG/ML-% IV SOLN
500.0000 mg | Freq: Three times a day (TID) | INTRAVENOUS | Status: DC
  Administered 2019-02-15 – 2019-02-19 (×12): 500 mg via INTRAVENOUS
  Filled 2019-02-15 (×12): qty 100

## 2019-02-15 MED ORDER — MIRTAZAPINE 15 MG PO TABS
15.0000 mg | ORAL_TABLET | Freq: Every day | ORAL | Status: DC
  Administered 2019-02-15 – 2019-02-19 (×5): 15 mg via ORAL
  Filled 2019-02-15 (×5): qty 1

## 2019-02-15 MED ORDER — MORPHINE SULFATE ER 30 MG PO TBCR
30.0000 mg | EXTENDED_RELEASE_TABLET | Freq: Two times a day (BID) | ORAL | Status: DC
  Administered 2019-02-15 – 2019-02-20 (×11): 30 mg via ORAL
  Filled 2019-02-15 (×11): qty 1

## 2019-02-15 MED ORDER — ALBUTEROL SULFATE (2.5 MG/3ML) 0.083% IN NEBU: 2.5000 mg | INHALATION_SOLUTION | RESPIRATORY_TRACT | Status: DC | PRN

## 2019-02-15 MED ORDER — SODIUM CHLORIDE 0.9 % IV BOLUS
500.0000 mL | Freq: Once | INTRAVENOUS | Status: AC
  Administered 2019-02-15: 500 mL via INTRAVENOUS

## 2019-02-15 MED ORDER — GABAPENTIN 300 MG PO CAPS
300.0000 mg | ORAL_CAPSULE | Freq: Three times a day (TID) | ORAL | Status: DC
  Administered 2019-02-15 – 2019-02-20 (×16): 300 mg via ORAL
  Filled 2019-02-15 (×16): qty 1

## 2019-02-15 MED ORDER — ONDANSETRON HCL 4 MG/2ML IJ SOLN: 4.0000 mg | Freq: Four times a day (QID) | INTRAMUSCULAR | Status: DC | PRN

## 2019-02-15 MED ORDER — ONDANSETRON HCL 4 MG PO TABS: 4.0000 mg | ORAL_TABLET | Freq: Four times a day (QID) | ORAL | Status: DC | PRN

## 2019-02-15 MED ORDER — IOPAMIDOL (ISOVUE-300) INJECTION 61%
INTRAVENOUS | Status: AC
  Administered 2019-02-15: 100 mL via INTRAVENOUS
  Filled 2019-02-15: qty 100

## 2019-02-15 MED ORDER — MORPHINE SULFATE (PF) 4 MG/ML IV SOLN
4.0000 mg | Freq: Once | INTRAVENOUS | Status: AC
  Administered 2019-02-15: 4 mg via INTRAVENOUS
  Filled 2019-02-15: qty 1

## 2019-02-15 MED ORDER — CHLORPROMAZINE HCL 10 MG PO TABS
10.0000 mg | ORAL_TABLET | Freq: Three times a day (TID) | ORAL | Status: DC | PRN
  Filled 2019-02-15: qty 1

## 2019-02-15 MED ORDER — SODIUM CHLORIDE (PF) 0.9 % IJ SOLN
INTRAMUSCULAR | Status: AC
  Filled 2019-02-15: qty 50

## 2019-02-15 MED ORDER — ACETAMINOPHEN 650 MG RE SUPP: 650.0000 mg | Freq: Four times a day (QID) | RECTAL | Status: DC | PRN

## 2019-02-15 NOTE — ED Notes (Signed)
ED TO INPATIENT HANDOFF REPORT  Name/Age/Gender Jeff [name] 58 y.o. male  Code Status    Code Status Orders  (From admission, onward)         Start     Ordered   02/15/19 1045  Full code  Continuous     02/15/19 1044        Code Status History    Date Active Date Inactive Code Status Order ID Comments User Context   09/13/2018 0042 09/16/2018 1648 Full Code [id number]  Toy Baker, MD Inpatient   12/31/2017 1227 01/11/2018 1446 Full Code [id number]  Theodis Blaze, MD Inpatient   08/19/2017 1755 08/21/2017 2326 Full Code [id number]  Vashti Hey, MD Inpatient   07/29/2017 1738 08/17/2017 1844 Full Code [id number]  Vianne Bulls, MD ED   07/10/2017 0041 07/14/2017 1635 Full Code [id number]  Ivor Costa, MD ED   01/11/2017 0150 01/19/2017 2008 Full Code [id number]  Etta Quill, DO ED    Advance Directive Documentation     Most Recent Value  Type of Advance Directive  Healthcare Power of Attorney, Living will  Pre-existing out of facility DNR order (yellow form or pink MOST form)  -  "MOST" Form in Place?  -      Home/SNF/Other Home  Chief Complaint CA Pt; Abdominal Pain  Level of Care/Admitting Diagnosis ED Disposition    ED Disposition Condition [place]: [facility] [[id number]]  Level of Care: Med-Surg [16]  Diagnosis: Bowel perforation [facility]) [[id number]]  Admitting Physician: Bonnielee Haff [[id number]]  Attending Physician: Bonnielee Haff [[id number]]  Estimated length of stay: past midnight tomorrow  Certification:: I certify this patient will need inpatient services for at least 2 midnights  PT Class (Do Not Modify): Inpatient [101]  PT Acc Code (Do Not Modify): Private [1]       Medical History Past Medical History:  Diagnosis Date  . Acute deep vein thrombosis (DVT) of right lower extremity ([place]) 02/18/2017  . Colon cancer ([place])   . Microcytic anemia    /notes 01/11/2017    Allergies No Known Allergies  IV  Location/Drains/Wounds Patient Lines/Drains/Airways Status   Active Line/Drains/Airways    Name:   Placement date:   Placement time:   Site:   Days:   Implanted Port Right Chest   -    -    Chest      Peripheral IV 02/15/19 Right Forearm   02/15/19    0628    Forearm   less than 1   Colostomy RUQ   08/04/17    1312    RUQ   560          Labs/Imaging Results for orders placed or performed during the hospital encounter of 02/15/19 (from the past 48 hour(s))  Lipase, blood     Status: None   Collection Time: 02/15/19  4:29 AM  Result Value Ref Range   Lipase 28 11 - 51 U/L    Comment: Performed at [facility], [place] [address] [place],  [address]  Comprehensive metabolic panel     Status: Abnormal   Collection Time: 02/15/19  4:29 AM  Result Value Ref Range   Sodium 134 (L) 135 - 145 mmol/L   Potassium 3.7 3.5 - 5.1 mmol/L   Chloride 102 98 - 111 mmol/L   CO2 25 22 - 32 mmol/L   Glucose, Bld 107 (H) 70 - 99 mg/dL   BUN 10  6 - 20 mg/dL   Creatinine, Ser 0.88 0.61 - 1.24 mg/dL   Calcium 8.7 (L) 8.9 - 10.3 mg/dL   Total Protein 7.0 6.5 - 8.1 g/dL   Albumin 2.6 (L) 3.5 - 5.0 g/dL   AST 25 15 - 41 U/L   ALT 15 0 - 44 U/L   Alkaline Phosphatase 165 (H) 38 - 126 U/L   Total Bilirubin 0.5 0.3 - 1.2 mg/dL   GFR calc non Af Amer >60 >60 mL/min   GFR calc Af Amer >60 >60 mL/min   Anion gap 7 5 - 15    Comment: Performed at [facility], [place] [address], [place], [place] [address]  CBC     Status: Abnormal   Collection Time: 02/15/19  4:29 AM  Result Value Ref Range   WBC 7.7 4.0 - 10.5 K/uL   RBC 3.37 (L) 4.22 - 5.81 MIL/uL   Hemoglobin 8.6 (L) 13.0 - 17.0 g/dL   HCT 28.5 (L) 39.0 - 52.0 %   MCV 84.6 80.0 - 100.0 fL   MCH 25.5 (L) 26.0 - 34.0 pg   MCHC 30.2 30.0 - 36.0 g/dL   RDW 18.7 (H) 11.5 - 15.5 %   Platelets 159 150 - 400 K/uL   nRBC 0.0 0.0 - 0.2 %    Comment: Performed at [facility], [place] [address]  [place], [place] [address]  Lactic acid, plasma     Status: None   Collection Time: 02/15/19  4:30 AM  Result Value Ref Range   Lactic Acid, Venous 0.8 0.5 - 1.9 mmol/L    Comment: Performed at [facility], [place] [address] [place], [place] [address]  Urinalysis, Routine w reflex microscopic- may I&O cath if menses     Status: None   Collection Time: 02/15/19  6:28 AM  Result Value Ref Range   Color, Urine YELLOW YELLOW   APPearance CLEAR CLEAR   Specific Gravity, Urine 1.025 1.005 - 1.030   pH 5.0 5.0 - 8.0   Glucose, UA NEGATIVE NEGATIVE mg/dL   Hgb urine dipstick NEGATIVE NEGATIVE   Bilirubin Urine NEGATIVE NEGATIVE   Ketones, ur NEGATIVE NEGATIVE mg/dL   Protein, ur NEGATIVE NEGATIVE mg/dL   Nitrite NEGATIVE NEGATIVE   Leukocytes,Ua NEGATIVE NEGATIVE    Comment: Performed at [place] [address], [place], [place] [address]  Lactic acid, plasma     Status: None   Collection Time: 02/15/19  6:28 AM  Result Value Ref Range   Lactic Acid, Venous 0.7 0.5 - 1.9 mmol/L    Comment: Performed at [facility], [place] [address] [place], [place] [address]  Occult blood card to lab, stool     Status: None   Collection Time: 02/15/19  6:41 AM  Result Value Ref Range   Fecal Occult Bld NEGATIVE NEGATIVE    Comment: Performed at [facility], [place] [address], [place], [place] [address]   Ct Abdomen Pelvis W Contrast  Result Date: 02/15/2019 CLINICAL DATA:  Abdominal pain.  History of colon cancer. EXAM: CT ABDOMEN AND PELVIS WITH CONTRAST TECHNIQUE: Multidetector CT imaging of the abdomen and pelvis was performed using the standard protocol following bolus administration of intravenous contrast. CONTRAST:  111mL ISOVUE-300 IOPAMIDOL (ISOVUE-300) INJECTION 61% COMPARISON:  01/04/2019 CT abdomen pelvis FINDINGS: Lower chest: Lung bases are clear. Hepatobiliary: Normal patent contours. No biliary dilatation. The  gallbladder is normal. No focal liver lesion. Pancreas: Hypoattenuating focus  at the tail of the pancreas is unchanged, likely invading the pancreatic parenchyma. No dilatation of the pancreatic duct. Spleen: Low-density mass at the inferior splenic hilum is unchanged. Adrenals/Urinary Tract: The adrenal glands are normal. Both kidneys are normal. Urinary bladder is distended. Stomach/Bowel: Right lower quadrant dual lumen colostomy without evidence of obstruction. Edematous appearance on the prior scan has improved. Soft tissue mass at the splenic flexure, invading the splenic hilum and pancreatic tail is unchanged, measuring approximately 5.1 cm. Small foci of adjacent gas may be extraluminal (coronal image 51, axial image 22). No dilated small bowel. Vascular/Lymphatic: There is aortic atherosclerotic calcification. Extensive anterior abdominal collaterals are again demonstrated, in the context of chronic occlusion of the splenic vein. There is a stent within the left iliac vein without opacification. Soft tissue density surrounding the superior mesenteric artery is unchanged. Reproductive: Prostate is unremarkable. Other: None Musculoskeletal: No acute or significant osseous findings. IMPRESSION: 1. Unchanged size of colonic mass at the splenic flexure with extraluminal extension and invasion of the splenic hilum and pancreatic tail. Multiple nearby punctate foci of gas may be extraluminal, which would suggest microperforation. No large volume pneumoperitoneum. 2. Right lower quadrant transverse colostomy with decreased edema of the efferent limb. 3. 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 4. Soft tissue mass along the anterior aspect of the proximal superior mesenteric artery, unchanged. Electronically Signed   By: Ulyses Jarred M.D.   On: 02/15/2019 06:07    Pending Labs Unresulted Labs (From admission, onward)    Start      Ordered   02/16/19 0500  Comprehensive metabolic panel  Tomorrow morning,   R     02/15/19 1044   02/16/19 0500  CBC  Tomorrow morning,   R     02/15/19 1044   02/15/19 0338  Blood Culture (routine x 2)  BLOOD CULTURE X 2,   STAT     02/15/19 0339   02/15/19 0338  Urine culture  ONCE - STAT,   STAT     02/15/19 0339          Vitals/Pain Today's Vitals   02/15/19 0930 02/15/19 1000 02/15/19 1030 02/15/19 1100  BP: 108/78 105/80 105/78 102/77  Pulse: 98 (!) 102 98 100  Resp: 15 (!) 9 10 16   Temp:      TempSrc:      SpO2: 100% 100% 100% 100%  Weight:      Height:      PainSc:        Isolation Precautions No active isolations  Medications Medications  sodium chloride (PF) 0.9 % injection (has no administration in time range)  metroNIDAZOLE (FLAGYL) IVPB 500 mg (0 mg Intravenous Stopped 02/15/19 0919)  cefTRIAXone (ROCEPHIN) 2 g in sodium chloride 0.9 % 100 mL IVPB (has no administration in time range)  morphine (MS CONTIN) 12 hr tablet 30 mg (has no administration in time range)  morphine 2 MG/ML injection 2 mg (has no administration in time range)  chlorproMAZINE (THORAZINE) tablet 10 mg (has no administration in time range)  mirtazapine (REMERON) tablet 15 mg (has no administration in time range)  pantoprazole (PROTONIX) EC tablet 40 mg (has no administration in time range)  gabapentin (NEURONTIN) capsule 300 mg (has no administration in time range)  0.9 %  sodium chloride infusion (has no administration in time range)  acetaminophen (TYLENOL) tablet 650 mg (has no administration in time range)    Or  acetaminophen (TYLENOL) suppository 650 mg (has no administration in time range)  ondansetron (ZOFRAN) tablet 4 mg (has no administration in time range)    Or  ondansetron (ZOFRAN) injection 4 mg (has no administration in time range)  albuterol (PROVENTIL) (2.5 MG/3ML) 0.083% nebulizer solution 2.5 mg (has no administration in time range)  sodium chloride 0.9 % bolus 500  mL (0 mLs Intravenous Stopped 02/15/19 0528)  morphine 4 MG/ML injection 4 mg (4 mg Intravenous Given 02/15/19 0429)  iopamidol (ISOVUE-300) 61 % injection 100 mL (100 mLs Intravenous Contrast Given 02/15/19 0531)  cefTRIAXone (ROCEPHIN) 2 g in sodium chloride 0.9 % 100 mL IVPB (0 g Intravenous Stopped 02/15/19 0817)    Mobility walks

## 2019-02-15 NOTE — ED Provider Notes (Signed)
Hx of colon CA s/p colectomy here with abd pain.  Has microperforation in the splenic flexure area on CT scan.  Surgery will see pt.  Admit obs by medicine.      Domenic Moras, PA-C 02/16/19 1452    Little, Wenda Overland, MD 02/17/19 [phone]
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
## 2019-02-15 NOTE — ED Notes (Signed)
Surgery at bedside to see pt. PA removed colostomy and cleaned around the area. New ostomy placed via pt and awaiting hospitalist admission.

## 2019-02-15 NOTE — ED Notes (Signed)
Blood draw delayed, pt sts he has a port and would rather have it accessed.

## 2019-02-15 NOTE — ED Notes (Addendum)
Report called to Wyatt Portela, RN at this time and will be transported to [id number] with NA at this time.
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
## 2019-02-15 NOTE — ED Notes (Signed)
Attempted to call report due to pts bed being ready for a while and no handoff report put in. Per 6E:No handoff put in. Handoff put in at this time. Kaitlyn RN to call back for report in 10 min

## 2019-02-16 ENCOUNTER — Inpatient Hospital Stay (HOSPITAL_COMMUNITY): Payer: Medicaid Other

## 2019-02-16 ENCOUNTER — Ambulatory Visit: Payer: Self-pay

## 2019-02-16 ENCOUNTER — Other Ambulatory Visit: Payer: Self-pay

## 2019-02-16 ENCOUNTER — Ambulatory Visit: Payer: Self-pay | Admitting: Hematology

## 2019-02-16 DIAGNOSIS — C799 Secondary malignant neoplasm of unspecified site: Secondary | ICD-10-CM

## 2019-02-16 DIAGNOSIS — D630 Anemia in neoplastic disease: Secondary | ICD-10-CM

## 2019-02-16 DIAGNOSIS — Z86711 Personal history of pulmonary embolism: Secondary | ICD-10-CM

## 2019-02-16 DIAGNOSIS — Z7901 Long term (current) use of anticoagulants: Secondary | ICD-10-CM

## 2019-02-16 DIAGNOSIS — I825Z2 Chronic embolism and thrombosis of unspecified deep veins of left distal lower extremity: Secondary | ICD-10-CM

## 2019-02-16 DIAGNOSIS — Z95828 Presence of other vascular implants and grafts: Secondary | ICD-10-CM

## 2019-02-16 DIAGNOSIS — Z86718 Personal history of other venous thrombosis and embolism: Secondary | ICD-10-CM

## 2019-02-16 DIAGNOSIS — C189 Malignant neoplasm of colon, unspecified: Secondary | ICD-10-CM

## 2019-02-16 DIAGNOSIS — Z933 Colostomy status: Secondary | ICD-10-CM

## 2019-02-16 DIAGNOSIS — Z681 Body mass index (BMI) 19 or less, adult: Secondary | ICD-10-CM

## 2019-02-16 DIAGNOSIS — E43 Unspecified severe protein-calorie malnutrition: Secondary | ICD-10-CM

## 2019-02-16 DIAGNOSIS — K631 Perforation of intestine (nontraumatic): Principal | ICD-10-CM

## 2019-02-16 LAB — CBC
HCT: 28.1 % — ABNORMAL LOW (ref 39.0–52.0)
Hemoglobin: 8.3 g/dL — ABNORMAL LOW (ref 13.0–17.0)
MCH: 25.4 pg — ABNORMAL LOW (ref 26.0–34.0)
MCHC: 29.5 g/dL — ABNORMAL LOW (ref 30.0–36.0)
MCV: 85.9 fL (ref 80.0–100.0)
Platelets: 179 10*3/uL (ref 150–400)
RBC: 3.27 MIL/uL — ABNORMAL LOW (ref 4.22–5.81)
RDW: 18.9 % — AB (ref 11.5–15.5)
WBC: 7.7 10*3/uL (ref 4.0–10.5)
nRBC: 0 % (ref 0.0–0.2)

## 2019-02-16 LAB — URINE CULTURE: Culture: NO GROWTH

## 2019-02-16 LAB — IRON AND TIBC
Iron: 9 ug/dL — ABNORMAL LOW (ref 45–182)
Saturation Ratios: 5 % — ABNORMAL LOW (ref 17.9–39.5)
TIBC: 171 ug/dL — ABNORMAL LOW (ref 250–450)
UIBC: 162 ug/dL

## 2019-02-16 LAB — COMPREHENSIVE METABOLIC PANEL
ALT: 13 U/L (ref 0–44)
AST: 19 U/L (ref 15–41)
Albumin: 2.4 g/dL — ABNORMAL LOW (ref 3.5–5.0)
Alkaline Phosphatase: 130 U/L — ABNORMAL HIGH (ref 38–126)
Anion gap: 8 (ref 5–15)
BUN: 12 mg/dL (ref 6–20)
CO2: 23 mmol/L (ref 22–32)
CREATININE: 0.98 mg/dL (ref 0.61–1.24)
Calcium: 8.2 mg/dL — ABNORMAL LOW (ref 8.9–10.3)
Chloride: 105 mmol/L (ref 98–111)
GFR calc Af Amer: >60 mL/min (ref >60)
GFR calc non Af Amer: >60 mL/min (ref >60)
Glucose, Bld: 73 mg/dL (ref 70–99)
Potassium: 4.4 mmol/L (ref 3.5–5.1)
SODIUM: 136 mmol/L (ref 135–145)
Total Bilirubin: 0.6 mg/dL (ref 0.3–1.2)
Total Protein: 6.7 g/dL (ref 6.5–8.1)

## 2019-02-16 LAB — FOLATE: Folate: 30.4 ng/mL (ref >5.9)

## 2019-02-16 LAB — VITAMIN B12: Vitamin B-12: 524 pg/mL (ref 180–914)

## 2019-02-16 LAB — MAGNESIUM: Magnesium: 1.6 mg/dL — ABNORMAL LOW (ref 1.7–2.4)

## 2019-02-16 LAB — FERRITIN: Ferritin: 277 ng/mL (ref 24–336)

## 2019-02-16 MED ORDER — PANTOPRAZOLE SODIUM 40 MG IV SOLR
40.0000 mg | INTRAVENOUS | Status: DC
  Administered 2019-02-16 – 2019-02-17 (×2): 40 mg via INTRAVENOUS
  Filled 2019-02-16 (×2): qty 40

## 2019-02-16 MED ORDER — ENOXAPARIN SODIUM 30 MG/0.3ML ~~LOC~~ SOLN
30.0000 mg | SUBCUTANEOUS | Status: DC
  Administered 2019-02-16: 30 mg via SUBCUTANEOUS
  Filled 2019-02-16: qty 0.3

## 2019-02-16 MED ORDER — FERROUS SULFATE 325 (65 FE) MG PO TABS
325.0000 mg | ORAL_TABLET | Freq: Two times a day (BID) | ORAL | Status: DC
  Administered 2019-02-17 – 2019-02-20 (×7): 325 mg via ORAL
  Filled 2019-02-16 (×7): qty 1

## 2019-02-16 MED ORDER — OXYCODONE HCL 5 MG PO TABS
10.0000 mg | ORAL_TABLET | Freq: Four times a day (QID) | ORAL | Status: DC
  Administered 2019-02-17 – 2019-02-20 (×13): 10 mg via ORAL
  Filled 2019-02-16 (×14): qty 2

## 2019-02-16 MED ORDER — PROCHLORPERAZINE EDISYLATE 10 MG/2ML IJ SOLN: 10.0000 mg | Freq: Four times a day (QID) | INTRAMUSCULAR | Status: DC | PRN

## 2019-02-16 MED ORDER — MAGNESIUM SULFATE 4 GM/100ML IV SOLN
4.0000 g | Freq: Once | INTRAVENOUS | Status: AC
  Administered 2019-02-16: 4 g via INTRAVENOUS
  Filled 2019-02-16: qty 100

## 2019-02-16 MED ORDER — OXYCODONE HCL 5 MG PO TABS
10.0000 mg | ORAL_TABLET | Freq: Four times a day (QID) | ORAL | Status: DC
  Filled 2019-02-16 (×3): qty 2

## 2019-02-16 MED ORDER — SODIUM CHLORIDE 0.9 % IV SOLN
INTRAVENOUS | Status: DC
  Administered 2019-02-17: 13:00:00 via INTRAVENOUS

## 2019-02-16 MED ORDER — HYDROMORPHONE HCL 1 MG/ML IJ SOLN
1.0000 mg | INTRAMUSCULAR | Status: DC | PRN
  Administered 2019-02-16 – 2019-02-20 (×2): 1 mg via INTRAVENOUS
  Filled 2019-02-16 (×2): qty 1

## 2019-02-16 MED ORDER — ENOXAPARIN (LOVENOX) PATIENT EDUCATION KIT
PACK | Freq: Once | Status: DC
  Filled 2019-02-16: qty 1

## 2019-02-16 NOTE — Progress Notes (Signed)
    CC:  Abdominal pain  Subjective: Pt had some pain earlier, but I cannot tell distinguish between chronic and acute pain.  No pain currently on exam.   Objective: Vital signs in last 24 hours: Temp:  [99.4 F (37.4 C)-102.6 F (39.2 C)] 99.4 F (37.4 C) (02/20 0643) Pulse Rate:  [94-106] 97 (02/20 0643) Resp:  [9-18] 16 (02/20 0643) BP: (101-108)/(70-93) 107/72 (02/20 0643) SpO2:  [96 %-100 %] 100 % (02/20 0643)  481 IV 250 urine Afebrile, VSS one temp spike to 102.6 yesterday PM CMP is stable Mag 1.6 WBC is stable at 7.7 Intake/Output from previous day: 02/19 0701 - 02/20 0700 In: 481 [I.V.:237.7; IV Piggyback:243.2] Out: 250 [Urine:250] Intake/Output this shift: No intake/output data recorded.  General appearance: alert, cooperative and no distress GI: soft, non-tender; bowel sounds normal; no masses,  no organomegaly and ostomy is working well  Lab Results:  Recent Labs    02/15/19 0429 02/16/19 0343  WBC 7.7 7.7  HGB 8.6* 8.3*  HCT 28.5* 28.1*  PLT 159 179    BMET Recent Labs    02/15/19 0429 02/16/19 0343  NA 134* 136  K 3.7 4.4  CL 102 105  CO2 25 23  GLUCOSE 107* 73  BUN 10 12  CREATININE 0.88 0.98  CALCIUM 8.7* 8.2*   PT/INR No results for input(s): LABPROT, INR in the last 72 hours.  Recent Labs  Lab 02/09/19 1200 02/15/19 0429 02/16/19 0343  AST 13* 25 19  ALT <_0 ALKPHOS 92 165* 130*  BILITOT 0.5 0.5 0.6  PROT 6.8 7.0 6.7  ALBUMIN 3.1* 2.6* 2.4*     Lipase     Component Value Date/Time   LIPASE 28 02/15/2019 0429     Medications: . gabapentin  300 mg Oral TID  . mirtazapine  15 mg Oral QHS  . morphine  30 mg Oral Q12H  . pantoprazole (PROTONIX) IV  40 mg Intravenous Q24H   . sodium chloride    . cefTRIAXone (ROCEPHIN)  IV    . metronidazole 500 mg (02/16/19 0731)    Assessment/Plan DVT/PE on Xarelto - last dose this AM Severe malnutrition/deconditioning Hx pancytopenia on chemotherapy Anemia Recent  left radial fracture Chronic pain on morphine and oxycodone   Abdominal discomfort with possible colonic microperforation Metastatic adenocarcinoma of the colon with metastasis to the spleen and pancreas, cTxNxM1c, MSS, KRAS/NRS/BRAF wild type  -Transverse loop colostomy 08/04/2017, Dr. [name]  - Chemotherapy Dr. [name]  -Hx pancreatic tail abscess with IR drain 06/2017   - No pain 2/20 >> clear diet   FEN:  IV fluidsNS @ 100 ml/hr  >>  Clear diet ID:  Flagyl/Cipro 2/19 >>day 2 DVT:  SCD- he can be on heparin or Lovenox from our standpoint, hold Xarelto until we are sure he does not need surgery Follow up:  TBD   Plan: Advance to clear liquids continue antibiotics, mobilize, I-S, he can have heparin or Lovenox from our standpoint to protect him from further DVT/PE.      LOS: 1 day    Earnstine Regal 02/16/2019 [phone]

## 2019-02-16 NOTE — Evaluation (Signed)
Occupational Therapy Evaluation Patient Details Name: Jeff [name] MRN: [id number] [name]: [birth year] Today's Date: 02/16/2019    History of Present Illness 58 yo male admitted with bowel perforation. Recent L wrist fx-currently in cast. Hx of colon ca with mets, PE, DVT, IVC filter, colostomy   Clinical Impression   Pt admitted with bowel performation. Pt currently with functional limitations due to the deficits listed below (see OT Problem List).  Pt will benefit from skilled OT to increase their safety and independence with ADL and functional mobility for ADL to facilitate discharge to venue listed below.   Spoke with daughter regarding L wrist fx. Pt saw Dr Caralyn Guile and daughter stated pt was able to move his fingers.  Pt did do this with OT but was hesistant.  Encouraged AROM as well as AAROM for L fingers. Pt agreed     Follow Up Recommendations  Home health OT;Supervision/Assistance - 24 hour    Equipment Recommendations  None recommended by OT    Recommendations for Other Services       Precautions / Restrictions Precautions Precautions: Fall Required Braces or Orthoses: Splint/Cast Splint/Cast: L wrist Restrictions Weight Bearing Restrictions: No      Mobility Bed Mobility Overal bed mobility: Modified Independent             General bed mobility comments: pt inchair  Transfers Overall transfer level: Needs assistance   Transfers: Sit to/from Stand Sit to Stand: Min guard         General transfer comment: pt declined    Balance Overall balance assessment: Mild deficits observed, not formally tested                                         ADL either performed or assessed with clinical judgement   ADL Overall ADL's : Needs assistance/impaired Eating/Feeding: Minimal assistance;Sitting   Grooming: Minimal assistance;Sitting                                 General ADL Comments: Pt declined standing as he has just  gotten to chaitr.  OT spoke with pts daughter on the phone regarding pts PLOF.  Pt was I with simple ADL activity.  Daughter checks in a few times a day     Vision Patient Visual Report: No change from baseline              Pertinent Vitals/Pain Pain Assessment: No/denies pain     Hand Dominance  right   Extremity/Trunk Assessment Upper Extremity Assessment Upper Extremity Assessment: LUE deficits/detail LUE Deficits / Details: L forearm/wrist in cast/splint.  Pt able to use shoulder as well as fingers.  Encouraged active movement of fingers LUE: Unable to fully assess due to immobilization LUE Coordination: decreased fine motor;decreased gross motor   Lower Extremity Assessment Lower Extremity Assessment: Overall WFL for tasks assessed   Cervical / Trunk Assessment Cervical / Trunk Assessment: Normal   Communication Communication Communication: No difficulties   Cognition Arousal/Alertness: Awake/alert Behavior During Therapy: WFL for tasks assessed/performed Overall Cognitive Status: Within Functional Limits for tasks assessed  Home Living Family/patient expects to be discharged to:: Private residence Living Arrangements: Alone Available Help at Discharge: Family;Available PRN/intermittently Type of Home: House       Home Layout: One level     Bathroom Shower/Tub: Teacher, early years/pre: Standard     Home Equipment: None          Prior Functioning/Environment Level of Independence: Independent                 OT Problem List: Decreased strength;Decreased range of motion;Decreased activity tolerance;Impaired balance (sitting and/or standing);Decreased safety awareness;Decreased knowledge of use of DME or AE;Decreased knowledge of precautions;Impaired UE functional use      OT Treatment/Interventions: Self-care/ADL training;DME and/or AE instruction;Patient/family  education;Therapeutic activities    OT Goals(Current goals can be found in the care plan section) Acute Rehab OT Goals Patient Stated Goal: none stated OT Goal Formulation: With patient Time For Goal Achievement: 02/23/19  OT Frequency: Min 2X/week   Barriers to D/C: Decreased caregiver support             AM-PAC OT "6 Clicks" Daily Activity     Outcome Measure Help from another person eating meals?: A Little Help from another person taking care of personal grooming?: A Little Help from another person toileting, which includes using toliet, bedpan, or urinal?: A Little Help from another person bathing (including washing, rinsing, drying)?: A Little Help from another person to put on and taking off regular upper body clothing?: A Little Help from another person to put on and taking off regular lower body clothing?: A Little 6 Click Score: 18   End of Session    Activity Tolerance: Other (comment)(limited participation) Patient left: in chair;with chair alarm set;with call bell/phone within reach  OT Visit Diagnosis: Unsteadiness on feet (R26.81);Other abnormalities of gait and mobility (R26.89);Muscle weakness (generalized) (M62.81)                Time: 1655-3748 OT Time Calculation (min): 17 min Charges:  OT General Charges $OT Visit: 1 Visit OT Evaluation $OT Eval Moderate Complexity: 1 Mod  Kari Baars, [facility] [phone] Office- [phone], Edwena Felty D 02/16/2019, 3:54 PM

## 2019-02-16 NOTE — Progress Notes (Signed)
PROGRESS NOTE    Jeff [name]  WYO:[id number] [name]: 1961-08-20 [name]: 02/15/2019 PCP: Patient, [name]   Brief Narrative:  Patient is a 58 year old gentleman history of metastatic colon cancer being followed by oncology was on chemotherapy which is currently on hold, status post colostomy, history of PE and right lower extremity DVT status post thrombectomy, IVC filter placement and currently on Xarelto presented with abdominal pain on the left side of his abdomen with some nausea and no emesis.  CT scan done in the ED concerning for possible bowel perforation.  General surgery consulted.  Patient placed empirically on IV ciprofloxacin and IV Flagyl.   Assessment & Plan:   Principal Problem:   Bowel perforation (HCC) Active Problems:   Protein-calorie malnutrition, severe   Primary colon cancer with metastasis to other site [facility])   Anemia in neoplastic disease   Abdominal pain   Chronic anticoagulation   Lower leg DVT (deep venous thromboembolism), chronic, left (HCC)   Status post insertion of inferior vena caval filter   Colostomy in place [facility])   Malignant neoplasm of colon ([place])  1?  Bowel perforation in the setting of known metastatic colon cancer Patient had presented with abdominal pain.  CT scan done was concerning for bowel perforation.  Patient on bowel rest.  Patient pancultured.  Patient placed empirically on IV Rocephin and Flagyl.  Patient seen in consultation by general surgery and was felt patient may have possible colonic micro perforation.  Patient improved clinically.  Patient placed on clear liquids per general surgery.  Mobilize.  Continue empiric IV Rocephin and Flagyl.  2.  Metastatic colon cancer Patient was on chemotherapy which is currently on hold.  Patient was seen by his oncologist, [name] today.  Outpatient follow-up.  3.  History of left lower extremity DVT and PE.   Patient status post thrombectomy.  Was on Xarelto which was held in anticipation  of possible surgical intervention.  Patient improving clinically.  Patient on conservative treatment.  Patient has started on Lovenox today and per general surgery could likely be resumed on Xarelto.  If patient is stable will resume Xarelto tomorrow.  4.  Severe protein calorie malnutrition Likely secondary to malignancy.  Once patient on a full diet will likely need nutritional supplementation.  5.  Normocytic anemia Hemoglobin noted to be 8.6 on admission, which is lower than his baseline.  Hemoglobin currently at 8.3.  Patient with no overt bleeding.  Check an anemia panel.  Follow H&H.  6.  Cancer associated pain.   Patient was placed back on home regimen of morphine sulfate.  Patient was given some IV morphine overnight however per RN patient stated was having some hallucinations.  DC IV morphine and placed on IV Dilaudid as needed.  Resume home regimen of oxycodone.  Follow.  7.  Left wrist fracture Patient currently in a cast.  Outpatient follow-up with orthopedics, Dr. Apolonio Schneiders.  8.  Hypomagnesemia Replete.   DVT prophylaxis: Lovenox Code Status: Full Family Communication: Updated patient.  No family at bedside. Disposition Plan: To be determined.  Likely home when clinically improved.   Consultants:   General surgery: Dr. Ninfa Linden 02/15/2019  Oncology: [name] 02/16/2019  Procedures:   CT abdomen and pelvis 02/15/2019  Chest x-ray 02/16/2019  Antimicrobials:   IV Rocephin 02/15/2019  IV Flagyl 02/15/2019   Subjective: Patient sitting up in chair.  Denies any abdominal pain.  Denies any nausea or emesis.  Feeling better.  T-max of 102.6 last  night.  Objective: Vitals:   02/15/19 1433 02/15/19 2101 02/15/19 2314 02/16/19 0643  BP: 101/71 101/70 101/72 107/72  Pulse: (!) 105 (!) 106 94 97  Resp:  16 16 16   Temp: 99.9 F (37.7 C) (!) 102.6 F (39.2 C) 99.4 F (37.4 C) 99.4 F (37.4 C)  TempSrc: Oral Oral Oral Oral  SpO2: 100% 96% 98% 100%  Weight:        Height:        Intake/Output Summary (Last 24 hours) at 02/16/2019 1257 Last data filed at 02/16/2019 0834 Gross per 24 hour  Intake 281.72 ml  Output 650 ml  Net -368.28 ml   Filed Weights   02/15/19 0625  Weight: 56.7 kg    Examination:  General exam: Appears calm and comfortable  Respiratory system: Clear to auscultation. Respiratory effort normal. Cardiovascular system: S1 & S2 heard, RRR. No JVD, murmurs, rubs, gallops or clicks. No pedal edema. Gastrointestinal system: Abdomen is nondistended, soft and nontender. No organomegaly or masses felt. Normal bowel sounds heard.  Colostomy bag with liquid stool. Central nervous system: Alert and oriented. No focal neurological deficits. Extremities: Left upper extremity in cast.  Symmetric 5 x 5 power. Skin: No rashes, lesions or ulcers Psychiatry: Judgement and insight appear normal. Mood & affect appropriate.     Data Reviewed: I have personally reviewed following labs and imaging studies  CBC: Recent Labs  Lab 02/15/19 0429 02/16/19 0343  WBC 7.7 7.7  HGB 8.6* 8.3*  HCT 28.5* 28.1*  MCV 84.6 85.9  PLT 159 149   Basic Metabolic Panel: Recent Labs  Lab 02/15/19 0429 02/16/19 0343  NA 134* 136  K 3.7 4.4  CL 102 105  CO2 25 23  GLUCOSE 107* 73  BUN 10 12  CREATININE 0.88 0.98  CALCIUM 8.7* 8.2*  MG  --  1.6*   GFR: Estimated Creatinine Clearance: 65.1 mL/min (by C-G formula based on SCr of 0.98 mg/dL). Liver Function Tests: Recent Labs  Lab 02/15/19 0429 02/16/19 0343  AST 25 19  ALT 15 13  ALKPHOS 165* 130*  BILITOT 0.5 0.6  PROT 7.0 6.7  ALBUMIN 2.6* 2.4*   Recent Labs  Lab 02/15/19 0429  LIPASE 28   No results for input(s): AMMONIA in the last 168 hours. Coagulation Profile: No results for input(s): INR, PROTIME in the last 168 hours. Cardiac Enzymes: No results for input(s): CKTOTAL, CKMB, CKMBINDEX, TROPONINI in the last 168 hours. BNP (last 3 results) No results for input(s): PROBNP  in the last 8760 hours. HbA1C: No results for input(s): HGBA1C in the last 72 hours. CBG: No results for input(s): GLUCAP in the last 168 hours. Lipid Profile: No results for input(s): CHOL, HDL, LDLCALC, TRIG, CHOLHDL, LDLDIRECT in the last 72 hours. Thyroid Function Tests: No results for input(s): TSH, T4TOTAL, FREET4, T3FREE, THYROIDAB in the last 72 hours. Anemia Panel: No results for input(s): VITAMINB12, FOLATE, FERRITIN, TIBC, IRON, RETICCTPCT in the last 72 hours. Sepsis Labs: Recent Labs  Lab 02/15/19 0430 02/15/19 7026  LATICACIDVEN 0.8 0.7    Recent Results (from the past 240 hour(s))  Blood Culture (routine x 2)     Status: None (Preliminary result)   Collection Time: 02/15/19  4:30 AM  Result Value Ref Range Status   Specimen Description   Final    BLOOD PORTA CATH Performed at [facility] [address] [place], [place] [address]    Special Requests   Final    BOTTLES DRAWN  AEROBIC AND ANAEROBIC Blood Culture adequate volume Performed at [facility] [address], [place], [place] [address]    Culture   Final    NO GROWTH 1 DAY Performed at [facility], [place] [address] [place], [place] [address]    Report Status PENDING  Incomplete  Blood Culture (routine x 2)     Status: None (Preliminary result)   Collection Time: 02/15/19  6:28 AM  Result Value Ref Range Status   Specimen Description   Final    BLOOD BLOOD RIGHT FOREARM Performed at [facility] [address], [place], [place] [address]    Special Requests   Final    BOTTLES DRAWN AEROBIC AND ANAEROBIC Blood Culture adequate volume Performed at [facility] [address] [place] [address], [place], [place] [address]    Culture   Final    NO GROWTH 1 DAY Performed at [facility], [place] [address], [place], [place] [address]    Report Status PENDING  Incomplete  Urine culture     Status: None   Collection Time: 02/15/19   6:28 AM  Result Value Ref Range Status   Specimen Description   Final    URINE, CLEAN CATCH Performed at [facility], [place] [address], [place], [place] [address]    Special Requests   Final    NONE Performed at [facility], [place] [address], [place], [place] [address]    Culture   Final    NO GROWTH Performed at [facility], [place] [address] [place], [place] [address]    Report Status 02/16/2019 FINAL  Final         Radiology Studies: Dg Chest 2 View  Result Date: 02/16/2019 CLINICAL DATA:  Colon cancer. Weakness. EXAM: CHEST - 2 VIEW COMPARISON:  CT chest 12/19/2018. Chest radiograph 01/08/2018. FINDINGS: Unchanged Port-A-Cath. Normal heart size. No consolidation, edema, or pulmonary nodules. No visible free air. Apparent severe malnutrition. No acute osseous findings. IMPRESSION: 1. No active cardiopulmonary disease. Electronically Signed   By: Staci Righter M.D.   On: 02/16/2019 10:15   Ct Abdomen Pelvis W Contrast  Result Date: 02/15/2019 CLINICAL DATA:  Abdominal pain.  History of colon cancer. EXAM: CT ABDOMEN AND PELVIS WITH CONTRAST TECHNIQUE: Multidetector CT imaging of the abdomen and pelvis was performed using the standard protocol following bolus administration of intravenous contrast. CONTRAST:  144mL ISOVUE-300 IOPAMIDOL (ISOVUE-300) INJECTION 61% COMPARISON:  01/04/2019 CT abdomen pelvis FINDINGS: Lower chest: Lung bases are clear. Hepatobiliary: Normal patent contours. No biliary dilatation. The gallbladder is normal. No focal liver lesion. Pancreas: Hypoattenuating focus at the tail of the pancreas is unchanged, likely invading the pancreatic parenchyma. No dilatation of the pancreatic duct. Spleen: Low-density mass at the inferior splenic hilum is unchanged. Adrenals/Urinary Tract: The adrenal glands are normal. Both kidneys are normal. Urinary bladder is distended. Stomach/Bowel: Right lower quadrant dual lumen colostomy  without evidence of obstruction. Edematous appearance on the prior scan has improved. Soft tissue mass at the splenic flexure, invading the splenic hilum and pancreatic tail is unchanged, measuring approximately 5.1 cm. Small foci of adjacent gas may be extraluminal (coronal image 51, axial image 22). No dilated small bowel. Vascular/Lymphatic: There is aortic atherosclerotic calcification. Extensive anterior abdominal collaterals are again demonstrated, in the context of chronic occlusion of the splenic vein. There is a stent within the left iliac vein without opacification. Soft tissue density surrounding the superior mesenteric artery is  unchanged. Reproductive: Prostate is unremarkable. Other: None Musculoskeletal: No acute or significant osseous findings. IMPRESSION: 1. Unchanged size of colonic mass at the splenic flexure with extraluminal extension and invasion of the splenic hilum and pancreatic tail. Multiple nearby punctate foci of gas may be extraluminal, which would suggest microperforation. No large volume pneumoperitoneum. 2. Right lower quadrant transverse colostomy with decreased edema of the efferent limb. 3. 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 4. Soft tissue mass along the anterior aspect of the proximal superior mesenteric artery, unchanged. Electronically Signed   By: Ulyses Jarred M.D.   On: 02/15/2019 06:07        Scheduled Meds: . enoxaparin (LOVENOX) injection  30 mg Subcutaneous Q24H  . enoxaparin   Does not apply Once  . gabapentin  300 mg Oral TID  . mirtazapine  15 mg Oral QHS  . morphine  30 mg Oral Q12H  . pantoprazole (PROTONIX) IV  40 mg Intravenous Q24H   Continuous Infusions: . sodium chloride 100 mL/hr at 02/16/19 0929  . cefTRIAXone (ROCEPHIN)  IV 2 g (02/16/19 0933)  . magnesium sulfate 1 - 4 g bolus IVPB    . metronidazole 500 mg (02/16/19 0731)     LOS: 1 day     Time spent: 40 minutes    Irine Seal, MD [facility]  If 7PM-7AM, please contact night-coverage [email or web address] 02/16/2019, 12:57 PM

## 2019-02-16 NOTE — Consult Note (Signed)
[place] Nurse ostomy consult note Stoma type/location: RLQ colostomy.  Since 2018.  Independent with care.  Wears one piece convex pouch.  POuch is 1/2 full of stool. He has just walked with therapy.  Is ordering food try now and states he will empty after eating.  Stomal assessment/size: pink patent and producing soft brown stool Peristomal assessment: not assessed Was last changed 02/15/19 Treatment options for stomal/peristomal skin: Does not want barrier ring Output soft brown stool Ostomy pouching: 1pc.convex pouch Education provided: Patient independent.  Enrolled patient in [place] program: No Will not follow at this time.  Please re-consult if needed.  Domenic Moras MSN, RN, FNP-BC CWON Wound, Ostomy, Continence Nurse Pager [phone]

## 2019-02-16 NOTE — Evaluation (Addendum)
Physical Therapy Evaluation Patient Details Name: Jeff [name] MRN: [id number] [name]: 27-Sep-1961 Today's Date: 02/16/2019   History of Present Illness  58 yo male admitted with bowel perforation. Recent L wrist fx-currently in cast. Hx of colon ca with mets, PE, DVT, IVC filter, colostomy  Clinical Impression  On eval, pt w3s Min guard assist for mobility. He walked ~115 feet around the unit. Mildly unsteady at times but no LOB. Will follow during hospital stay. Do not anticipate any f/u PT needs after discharge.     Follow Up Recommendations Supervision - Intermittent    Equipment Recommendations  None recommended by PT    Recommendations for Other Services       Precautions / Restrictions Precautions Precautions: Fall Required Braces or Orthoses: Splint/Cast Splint/Cast: L wrist Restrictions     Mobility  Bed Mobility Overal bed mobility: Modified Independent             General bed mobility comments: Increased time.   Transfers Overall transfer level: Needs assistance   Transfers: Sit to/from Stand Sit to Stand: Min guard         General transfer comment: for safety. Increased time.   Ambulation/Gait Ambulation/Gait assistance: Min guard Gait Distance (Feet): 115 Feet Assistive device: None Gait Pattern/deviations: Step-through pattern;Decreased stride length     General Gait Details: close guard for safety. slow gait speed. mildly unsteady but no LOB.   Stairs            Wheelchair Mobility    Modified Rankin (Stroke Patients Only)       Balance Overall balance assessment: Mild deficits observed, not formally tested                                           Pertinent Vitals/Pain Pain Assessment: No/denies pain    Home Living Family/patient expects to be discharged to:: Private residence Living Arrangements: Alone           Home Layout: One level Home Equipment: None      Prior Function Level of  Independence: Independent               Hand Dominance        Extremity/Trunk Assessment   Upper Extremity Assessment Upper Extremity Assessment: Defer to OT evaluation;LUE deficits/detail LUE Deficits / Details: L forearm/wrist in cast/splint    Lower Extremity Assessment Lower Extremity Assessment: Overall WFL for tasks assessed    Cervical / Trunk Assessment Cervical / Trunk Assessment: Normal  Communication   Communication: No difficulties  Cognition Arousal/Alertness: Awake/alert Behavior During Therapy: WFL for tasks assessed/performed Overall Cognitive Status: Within Functional Limits for tasks assessed                                        General Comments      Exercises     Assessment/Plan    PT Assessment Patient needs continued PT services  PT Problem List Decreased mobility       PT Treatment Interventions Gait training;Therapeutic activities;Functional mobility training;Balance training;Therapeutic exercise;Patient/family education    PT Goals (Current goals can be found in the Care Plan section)  Acute Rehab PT Goals Patient Stated Goal: none stated PT Goal Formulation: With patient Time For Goal Achievement: 03/02/19 Potential to Achieve Goals: Good  Frequency Min 3X/week   Barriers to discharge        Co-evaluation               AM-PAC PT "6 Clicks" Mobility  Outcome Measure Help needed turning from your back to your side while in a flat bed without using bedrails?: None Help needed moving from lying on your back to sitting on the side of a flat bed without using bedrails?: A Little Help needed moving to and from a bed to a chair (including a wheelchair)?: A Little Help needed standing up from a chair using your arms (e.g., wheelchair or bedside chair)?: A Little Help needed to walk in hospital room?: A Little Help needed climbing 3-5 steps with a railing? : A Little 6 Click Score: 19    End of Session  Equipment Utilized During Treatment: Gait belt Activity Tolerance: Patient tolerated treatment well Patient left: in bed;with call bell/phone within reach;with nursing/sitter in room   PT Visit Diagnosis: Unsteadiness on feet (R26.81);History of falling (Z91.81)    Time: 5830-9407 PT Time Calculation (min) (ACUTE ONLY): 25 min   Charges:   PT Evaluation $PT Eval Moderate Complexity: 1 Mod PT Treatments $Gait Training: 8-22 mins          Weston Anna, PT Acute Rehabilitation Services Pager: [phone] Office: [phone]

## 2019-02-16 NOTE — Progress Notes (Signed)
Jeff [name]   [name]:05/13/1961   MH#:[id number]   [id number]#:[id number]  Oncology follow-up note  Subjective: Patient is well-known to me, under my care for his metastatic colon cancer.  He is on palliative chemotherapy, last dose on January 26, 2019.  Chemotherapy was held last week, due to his recent left wrist fracture.  He ended to hospital with abdominal pain, which gradually started a few days before admission.  CT scan in the ED was concerning for possible bowel perforation.  He has been seen by general surgery, on IV antibiotics, and clear liquid diet.  He states his abdominal pain has much improved.  His Xarelto has been held since admission.   Objective:  Vitals:   02/16/19 0643 02/16/19 1434  BP: 107/72 117/79  Pulse: 97 (!) 102  Resp: 16 18  Temp: 99.4 F (37.4 C) 99.4 F (37.4 C)  SpO2: 100% 100%    Body mass index is 18.46 kg/m.  Intake/Output Summary (Last 24 hours) at 02/16/2019 2000 Last data filed at 02/16/2019 1807 Gross per 24 hour  Intake 360 ml  Output 950 ml  Net -590 ml     Sclerae unicteric  Oropharynx clear  No peripheral adenopathy  Lungs clear -- no rales or rhonchi  Heart regular rate and rhythm  Abdomen soft, mild diffuse abdominal tenderness, bowel sound normal.   MSK no focal spinal tenderness, no peripheral edema  Neuro nonfocal   CBG (last 3)  No results for input(s): GLUCAP in the last 72 hours.   Labs:  Lab Results  Component Value Date   WBC 7.7 02/16/2019   HGB 8.3 (L) 02/16/2019   HCT 28.1 (L) 02/16/2019   MCV 85.9 02/16/2019   PLT 179 02/16/2019   NEUTROABS 3.8 01/26/2019   CMP Latest Ref Rng & Units 02/16/2019 02/15/2019 02/09/2019  Glucose 70 - 99 mg/dL 73 107(H) 115(H)  BUN 6 - 20 mg/dL 12 10 8   Creatinine 0.61 - 1.24 mg/dL 0.98 0.88 0.78  Sodium 135 - 145 mmol/L 136 134(L) 134(L)  Potassium 3.5 - 5.1 mmol/L 4.4 3.7 3.8  Chloride 98 - 111 mmol/L 105 102 99  CO2 22 - 32 mmol/L 23 25 26   Calcium 8.9 - 10.3 mg/dL 8.2(L) 8.7(L)  8.1(L)  Total Protein 6.5 - 8.1 g/dL 6.7 7.0 6.8  Total Bilirubin 0.3 - 1.2 mg/dL 0.6 0.5 0.5  Alkaline Phos 38 - 126 U/L 130(H) 165(H) 92  AST 15 - 41 U/L 19 25 13(L)  ALT 0 - 44 U/L 13 15 <6     Urine Studies No results for input(s): UHGB, CRYS in the last 72 hours.  Invalid input(s): UACOL, UAPR, USPG, UPH, UTP, UGL, UKET, UBIL, UNIT, UROB, ULEU, UEPI, UWBC, URBC, UBAC, CAST, UCOM, BILUA  Basic Metabolic Panel: Recent Labs  Lab 02/15/19 0429 02/16/19 0343  NA 134* 136  K 3.7 4.4  CL 102 105  CO2 25 23  GLUCOSE 107* 73  BUN 10 12  CREATININE 0.88 0.98  CALCIUM 8.7* 8.2*  MG  --  1.6*   GFR Estimated Creatinine Clearance: 65.1 mL/min (by C-G formula based on SCr of 0.98 mg/dL). Liver Function Tests: Recent Labs  Lab 02/15/19 0429 02/16/19 0343  AST 25 19  ALT 15 13  ALKPHOS 165* 130*  BILITOT 0.5 0.6  PROT 7.0 6.7  ALBUMIN 2.6* 2.4*   Recent Labs  Lab 02/15/19 0429  LIPASE 28   No results for input(s): AMMONIA in the last 168 hours. Coagulation profile No  results for input(s): INR, PROTIME in the last 168 hours.  CBC: Recent Labs  Lab 02/15/19 0429 02/16/19 0343  WBC 7.7 7.7  HGB 8.6* 8.3*  HCT 28.5* 28.1*  MCV 84.6 85.9  PLT 159 179   Cardiac Enzymes: No results for input(s): CKTOTAL, CKMB, CKMBINDEX, TROPONINI in the last 168 hours. BNP: Invalid input(s): POCBNP CBG: No results for input(s): GLUCAP in the last 168 hours. D-Dimer No results for input(s): DDIMER in the last 72 hours. Hgb A1c No results for input(s): HGBA1C in the last 72 hours. Lipid Profile No results for input(s): CHOL, HDL, LDLCALC, TRIG, CHOLHDL, LDLDIRECT in the last 72 hours. Thyroid function studies No results for input(s): TSH, T4TOTAL, T3FREE, THYROIDAB in the last 72 hours.  Invalid input(s): FREET3 Anemia work up Recent Labs    02/16/19 1404  VITAMINB12 524  FERRITIN 277  TIBC 171*  IRON 9*   Microbiology Recent Results (from the past 240 hour(s))   Blood Culture (routine x 2)     Status: None (Preliminary result)   Collection Time: 02/15/19  4:30 AM  Result Value Ref Range Status   Specimen Description   Final    BLOOD PORTA CATH Performed at [facility], [place] [address] [place], [place] [address]    Special Requests   Final    BOTTLES DRAWN AEROBIC AND ANAEROBIC Blood Culture adequate volume Performed at [place] [address], [place], [place] [address]    Culture   Final    NO GROWTH 1 DAY Performed at [facility], [place] [address], [place], [place] [address]    Report Status PENDING  Incomplete  Blood Culture (routine x 2)     Status: None (Preliminary result)   Collection Time: 02/15/19  6:28 AM  Result Value Ref Range Status   Specimen Description   Final    BLOOD BLOOD RIGHT FOREARM Performed at [facility] [address], [place], [place] [address]    Special Requests   Final    BOTTLES DRAWN AEROBIC AND ANAEROBIC Blood Culture adequate volume Performed at [facility] [address] [place], [place] [address]    Culture   Final    NO GROWTH 1 DAY Performed at [facility], [place] [address] [place], [name] [address]    Report Status PENDING  Incomplete  Urine culture     Status: None   Collection Time: 02/15/19  6:28 AM  Result Value Ref Range Status   Specimen Description   Final    URINE, CLEAN CATCH Performed at [facility], [place] [address], [place], [place] [address]    Special Requests   Final    NONE Performed at [facility], [place] [address], [place], [place] [address]    Culture   Final    NO GROWTH Performed at [facility], [place] [address], [place], [place] [address]    Report Status 02/16/2019 FINAL  Final      Studies:  Dg Chest 2 View  Result Date: 02/16/2019 CLINICAL DATA:  Colon cancer. Weakness. EXAM: CHEST - 2 VIEW COMPARISON:  CT chest  12/19/2018. Chest radiograph 01/08/2018. FINDINGS: Unchanged Port-A-Cath. Normal heart size. No consolidation, edema, or pulmonary nodules. No visible free air. Apparent severe malnutrition. No acute osseous findings. IMPRESSION: 1. No active cardiopulmonary disease. Electronically Signed   By: Staci Righter M.D.   On: 02/16/2019 10:15   Ct Abdomen  Pelvis W Contrast  Result Date: 02/15/2019 CLINICAL DATA:  Abdominal pain.  History of colon cancer. EXAM: CT ABDOMEN AND PELVIS WITH CONTRAST TECHNIQUE: Multidetector CT imaging of the abdomen and pelvis was performed using the standard protocol following bolus administration of intravenous contrast. CONTRAST:  127mL ISOVUE-300 IOPAMIDOL (ISOVUE-300) INJECTION 61% COMPARISON:  01/04/2019 CT abdomen pelvis FINDINGS: Lower chest: Lung bases are clear. Hepatobiliary: Normal patent contours. No biliary dilatation. The gallbladder is normal. No focal liver lesion. Pancreas: Hypoattenuating focus at the tail of the pancreas is unchanged, likely invading the pancreatic parenchyma. No dilatation of the pancreatic duct. Spleen: Low-density mass at the inferior splenic hilum is unchanged. Adrenals/Urinary Tract: The adrenal glands are normal. Both kidneys are normal. Urinary bladder is distended. Stomach/Bowel: Right lower quadrant dual lumen colostomy without evidence of obstruction. Edematous appearance on the prior scan has improved. Soft tissue mass at the splenic flexure, invading the splenic hilum and pancreatic tail is unchanged, measuring approximately 5.1 cm. Small foci of adjacent gas may be extraluminal (coronal image 51, axial image 22). No dilated small bowel. Vascular/Lymphatic: There is aortic atherosclerotic calcification. Extensive anterior abdominal collaterals are again demonstrated, in the context of chronic occlusion of the splenic vein. There is a stent within the left iliac vein without opacification. Soft tissue density surrounding the superior  mesenteric artery is unchanged. Reproductive: Prostate is unremarkable. Other: None Musculoskeletal: No acute or significant osseous findings. IMPRESSION: 1. Unchanged size of colonic mass at the splenic flexure with extraluminal extension and invasion of the splenic hilum and pancreatic tail. Multiple nearby punctate foci of gas may be extraluminal, which would suggest microperforation. No large volume pneumoperitoneum. 2. Right lower quadrant transverse colostomy with decreased edema of the efferent limb. 3. 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 4. Soft tissue mass along the anterior aspect of the proximal superior mesenteric artery, unchanged. Electronically Signed   By: Ulyses Jarred M.D.   On: 02/15/2019 06:07    Assessment: 58 y.o. African-American male, with history of metastatic colon cancer, on palliative chemotherapy, status post colostomy, history of PE and bilateral lower extremity DVT, presented with abdominal pain  1. ? Bowel microperforation  2.  Metastatic colon cancer 3.  3 of PE and lower extremity DVT, status post IVC filter, and thrombectomy, on low dose Xarelto due to history of GI bleeding, on hold now  4. Anemia, worse, secondary to chemo, metastatic cancer, and IV fluids and dilution 5. Left wrist fracture  6.  Severe malnutrition  Plan:  -appreciate surgical team input, no plan for surgical intervention at this point, he is on antibiotics  -his H/H has dropped since admission, no overt GI bleeding, some component hem dilution from IVF, may consider blood transfusion if Hg<7.5  -he is at very high risk of thrombosis (multiple PE and DVT before), please consider increase lovenox from 30mg  to 60mg  daily (lower than therapeutic dose due to his risk of GI bleeding), and OK to switch back to Xarelto 15 mg dose on discharge  -advance diet per surgical team  -His chemo has been held due to his  recent wrist fracture  -I will f/u as needed    [name], MD 02/16/2019  8:00 PM

## 2019-02-16 NOTE — Progress Notes (Signed)
PT Cancellation Note  Patient Details Name: Jeff [name] MRN: [id number] [name]: [birth year]   Cancelled Treatment:    Reason Eval/Treat Not Completed: Attempted PT eval-pt requested PT check back another time. Will check back as schedule allows.    Weston Anna, PT Acute Rehabilitation Services Pager: [phone] Office: [phone]

## 2019-02-17 LAB — CBC
HCT: 28.1 % — ABNORMAL LOW (ref 39.0–52.0)
Hemoglobin: 8.5 g/dL — ABNORMAL LOW (ref 13.0–17.0)
MCH: 25.4 pg — AB (ref 26.0–34.0)
MCHC: 30.2 g/dL (ref 30.0–36.0)
MCV: 84.1 fL (ref 80.0–100.0)
Platelets: 190 10*3/uL (ref 150–400)
RBC: 3.34 MIL/uL — AB (ref 4.22–5.81)
RDW: 19 % — ABNORMAL HIGH (ref 11.5–15.5)
WBC: 9.2 10*3/uL (ref 4.0–10.5)
nRBC: 0 % (ref 0.0–0.2)

## 2019-02-17 LAB — BASIC METABOLIC PANEL
Anion gap: 8 (ref 5–15)
BUN: 8 mg/dL (ref 6–20)
CO2: 23 mmol/L (ref 22–32)
Calcium: 8 mg/dL — ABNORMAL LOW (ref 8.9–10.3)
Chloride: 102 mmol/L (ref 98–111)
Creatinine, Ser: 0.91 mg/dL (ref 0.61–1.24)
GFR calc Af Amer: >60 mL/min (ref >60)
GFR calc non Af Amer: >60 mL/min (ref >60)
Glucose, Bld: 106 mg/dL — ABNORMAL HIGH (ref 70–99)
Potassium: 3.8 mmol/L (ref 3.5–5.1)
SODIUM: 133 mmol/L — AB (ref 135–145)

## 2019-02-17 LAB — MAGNESIUM: Magnesium: 1.9 mg/dL (ref 1.7–2.4)

## 2019-02-17 MED ORDER — ENOXAPARIN SODIUM 60 MG/0.6ML ~~LOC~~ SOLN
60.0000 mg | SUBCUTANEOUS | Status: DC
  Administered 2019-02-17: 60 mg via SUBCUTANEOUS
  Filled 2019-02-17: qty 0.6

## 2019-02-17 MED ORDER — RIVAROXABAN 15 MG PO TABS
15.0000 mg | ORAL_TABLET | Freq: Every day | ORAL | Status: DC
  Administered 2019-02-18 – 2019-02-20 (×3): 15 mg via ORAL
  Filled 2019-02-17 (×3): qty 1

## 2019-02-17 NOTE — Progress Notes (Signed)
    QG:BEEFEOFHQ pain  Subjective: Pt denies any pain, tolerating clears, ostomy working well.  Fever at night is still an issue, nor obvious source NO IS in room.  Objective: Vital signs in last 24 hours: Temp:  [98 F (36.7 C)-103.2 F (39.6 C)] 98.6 F (37 C) (02/21 0712) Pulse Rate:  [98-118] 98 (02/21 0712) Resp:  [16-20] 16 (02/21 0712) BP: (114-128)/(71-94) 120/90 (02/21 0712) SpO2:  [96 %-100 %] 99 % (02/21 0712) Last BM Date: 02/17/19 360 PO recorded 2000 IV 1525 urine  Stool   5o recorded yesterday/250 this AM Fever up to 103 again last PM @ 1900 and 100.6 2300 hrs last PM, afebrile since that time BP up some Sats good on room air Labs are stable CXR 2/20:  Unchanged Port-A-Cath. Normal heart size. No consolidation, edema, or pulmonary nodules. No visible free air. Apparent severe malnutrition. No acute osseous findings. WBC 9.2   Intake/Output from previous day: 02/20 0701 - 02/21 0700 In: 2534.8 [P.O.:360; I.V.:1618.8; IV Piggyback:556.1] Out: 1575 [Urine:1525; Stool:50] Intake/Output this shift: Total I/O In: -  Out: 250 [Stool:250]  General appearance: alert, cooperative and no distress Resp: clear to auscultation bilaterally GI: soft, non-tender; bowel sounds normal; no masses,  no organomegaly and stool in the ostomy bag  Lab Results:  Recent Labs    02/16/19 0343 02/17/19 0400  WBC 7.7 9.2  HGB 8.3* 8.5*  HCT 28.1* 28.1*  PLT 179 190    BMET Recent Labs    02/16/19 0343 02/17/19 0400  NA 136 133*  K 4.4 3.8  CL 105 102  CO2 23 23  GLUCOSE 73 106*  BUN 12 8  CREATININE 0.98 0.91  CALCIUM 8.2* 8.0*   PT/INR No results for input(s): LABPROT, INR in the last 72 hours.  Recent Labs  Lab 02/15/19 0429 02/16/19 0343  AST 25 19  ALT 15 13  ALKPHOS 165* 130*  BILITOT 0.5 0.6  PROT 7.0 6.7  ALBUMIN 2.6* 2.4*     Lipase     Component Value Date/Time   LIPASE 28 02/15/2019 0429     Medications: . enoxaparin (LOVENOX)  injection  60 mg Subcutaneous Q24H  . ferrous sulfate  325 mg Oral BID WC  . gabapentin  300 mg Oral TID  . mirtazapine  15 mg Oral QHS  . morphine  30 mg Oral Q12H  . oxyCODONE  10 mg Oral Q6H  . 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 [name] -Chemotherapy Dr. 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 Will defer w/u of fever to Dr. Grandville Silos.  I put in another order for IS.  LOS: 2 days    Jeff [name] 02/17/2019 [phone]

## 2019-02-17 NOTE — Progress Notes (Signed)
Physical Therapy Treatment/Discharge Note Patient Details Name: Jeff [name] MRN: [id number] [name]: July 24, 1961 Today's Date: 02/17/2019    History of Present Illness 58 yo male admitted with bowel perforation. Recent L wrist fx-currently in cast. Hx of colon ca with mets, PE, DVT, IVC filter, colostomy    PT Comments    Progressing well with mobility. No LOB during session. Pt is currently Mod Ind with mobility. No further PT needs. Will sign off. Recommend daily ambulation with nursing/family supervision.    Follow Up Recommendations  Supervision - Intermittent     Equipment Recommendations  None recommended by PT    Recommendations for Other Services       Precautions / Restrictions Precautions Precautions: Fall Required Braces or Orthoses: Splint/Cast Splint/Cast: L wrist    Mobility  Bed Mobility Overal bed mobility: Modified Independent                Transfers Overall transfer level: Modified independent                  Ambulation/Gait Ambulation/Gait assistance: Modified independent (Device/Increase time) Gait Distance (Feet): 400 Feet Assistive device: None       General Gait Details: No LOB. Pt tolerated distance well.   Stairs             Wheelchair Mobility    Modified Rankin (Stroke Patients Only)       Balance Overall balance assessment: Modified Independent                                          Cognition Arousal/Alertness: Awake/alert Behavior During Therapy: WFL for tasks assessed/performed Overall Cognitive Status: Within Functional Limits for tasks assessed                                        Exercises      General Comments        Pertinent Vitals/Pain Pain Assessment: No/denies pain    Home Living                      Prior Function            PT Goals (current goals can now be found in the care plan section) Progress towards PT goals: Goals  met/education completed, patient discharged from PT    Frequency    Min 3X/week      PT Plan      Co-evaluation              AM-PAC PT "6 Clicks" Mobility   Outcome Measure  Help needed turning from your back to your side while in a flat bed without using bedrails?: None Help needed moving from lying on your back to sitting on the side of a flat bed without using bedrails?: A Little Help needed moving to and from a bed to a chair (including a wheelchair)?: None Help needed standing up from a chair using your arms (e.g., wheelchair or bedside chair)?: None Help needed to walk in hospital room?: None Help needed climbing 3-5 steps with a railing? : None 6 Click Score: 23    End of Session   Activity Tolerance: Patient tolerated treatment well Patient left: in bed;with call bell/phone within reach  Time: 1855-0158 PT Time Calculation (min) (ACUTE ONLY): 28 min  Charges:  $Gait Training: 23-37 mins                       Weston Anna, PT Acute Rehabilitation Services Pager: [phone] Office: [phone]

## 2019-02-17 NOTE — Progress Notes (Addendum)
ANTICOAGULATION CONSULT NOTE - Initial Consult  Pharmacy Consult for enoxaparin Indication: hx of DVT  No Known Allergies  Patient Measurements: Height: 5\' 9"  (175.3 cm) Weight: 125 lb (56.7 kg) IBW/kg (Calculated) : 70.7   Vital Signs: Temp: 98.6 F (37 C) (02/21 0712) Temp Source: Oral (02/21 0712) BP: 120/90 (02/21 0712) Pulse Rate: 98 (02/21 0712)  Labs: Recent Labs    02/15/19 0429 02/16/19 0343 02/17/19 0400  HGB 8.6* 8.3* 8.5*  HCT 28.5* 28.1* 28.1*  PLT 159 179 190  CREATININE 0.88 0.98 0.91    Estimated Creatinine Clearance: 70.1 mL/min (by C-G formula based on SCr of 0.91 mg/dL).   Medical History: Past Medical History:  Diagnosis Date  . Acute deep vein thrombosis (DVT) of right lower extremity ([place]) 02/18/2017  . Colon cancer ([place])   . Microcytic anemia    /notes 01/11/2017     Assessment: 58 y.o. African-American male, with history of metastatic colon cancer, on palliative chemotherapy, status post colostomy, history of PE and bilateral lower extremity DVT, presented with abdominal pain. Xarelto on hold, may need surgery.  Goal of Therapy:  Monitor platelets by anticoagulation protocol    Plan:  -Per Dr. Ernestina Penna note on 2/20 increased enoxaparin to 60mg  SQ q24h (lower than therapeutic dose due to pt risk of GI bleeding) -may switch back to xarelto 15mg  on discharge (per [name])  Dolly Rias RPh 02/17/2019, 12:10 PM Pager [phone]  PM update:  d/w Dr. Grandville Silos change to xarelto starting tomorrow Plan: D/c enoxaparin Starting 2/22 at 1200, xarelto 15mg  po daily F/u s/s bleeding  Dolly Rias RPh 02/17/2019, 2:02 PM Pager [phone]   F/u s/s bleeding

## 2019-02-17 NOTE — Progress Notes (Signed)
PROGRESS NOTE    Jeff [name]  IWP:[id number] [name]: [birth year] [name]: 02/15/2019 PCP: Patient, [name]   Brief Narrative:  Patient is a 58 year old gentleman history of metastatic colon cancer being followed by oncology was on chemotherapy which is currently on hold, status post colostomy, history of PE and right lower extremity DVT status post thrombectomy, IVC filter placement and currently on Xarelto presented with abdominal pain on the left side of his abdomen with some nausea and no emesis.  CT scan done in the ED concerning for possible bowel perforation.  General surgery consulted.  Patient placed empirically on IV ciprofloxacin and IV Flagyl.   Assessment & Plan:   Principal Problem:   Bowel perforation (HCC) Active Problems:   Protein-calorie malnutrition, severe   Primary colon cancer with metastasis to other site [facility])   Anemia in neoplastic disease   Abdominal pain   Chronic anticoagulation   Lower leg DVT (deep venous thromboembolism), chronic, left (HCC)   Status post insertion of inferior vena caval filter   Colostomy in place [facility])   Malignant neoplasm of colon ([place])  1?  Colonic microperforation in the setting of known metastatic adenocarcinoma of the colon. Patient had presented with abdominal pain.  CT scan done was concerning for bowel perforation.  Patient on bowel rest.  Patient pancultured.  Patient placed empirically on IV Rocephin and Flagyl.  Patient seen in consultation by general surgery and was felt patient may have possible colonic micro perforation.  Patient improved clinically.  Patient with no further abdominal pain.  No nausea or emesis.  Tolerating clear liquids.  Diet has been advanced to full liquid diet per general surgery.  Continue empiric IV Rocephin and Flagyl.  General surgery following and appreciate input and recommendations.   2.  Metastatic colon cancer Patient was on chemotherapy which is currently on hold.  Patient was seen by his  oncologist, [name].  Outpatient follow-up.  3.  History of left lower extremity DVT and PE.   Patient status post thrombectomy.  Was on Xarelto which was held in anticipation of possible surgical intervention.  Patient improving clinically.  Patient on conservative treatment.  Patient was started back on prophylactic dose Lovenox and was to be started on Xarelto however patient received dose of Lovenox and as such we will get full dose Lovenox today and Xarelto will be started tomorrow.   4.  Severe protein calorie malnutrition Likely secondary to malignancy.  Once patient on a full diet will likely need nutritional supplementation.  5.  Normocytic anemia/iron deficiency anemia. Hemoglobin noted to be 8.6 on admission, which is lower than his baseline.  Hemoglobin currently at 8.5.  Patient with no overt bleeding.  Anemia panel with iron level of 9, TIBC of 171, folate of 30.4.  Vitamin B12 of 524.  May need oral iron supplementation on discharge.  May consider IV iron during this hospitalization or in the outpatient setting of follow-up with oncology.  6.  Cancer associated pain.   Patient was placed back on home regimen of morphine sulfate.  Patient was given some IV morphine the evening of 02/15/2019, however per RN patient stated was having some hallucinations.  IV morphine has been discontinued and patient placed on IV Dilaudid as needed.  Resume home regimen of oxycodone.  Follow.  7.  Left wrist fracture Patient currently in a cast.  Outpatient follow-up with orthopedics, Dr. Apolonio Schneiders.  8.  Hypomagnesemia Magnesium at 1.9 after IV magnesium given yesterday.  DVT prophylaxis: Lovenox Code Status: Full Family Communication: Updated patient and daughter at bedside. Disposition Plan: To be determined.  Likely home when clinically improved, afebrile for 24 to 48 hours and tolerating a solid diet when okay with general surgery..   Consultants:   General surgery: Dr. Ninfa Linden  02/15/2019  Oncology: [name] 02/16/2019  Procedures:   CT abdomen and pelvis 02/15/2019  Chest x-ray 02/16/2019  Antimicrobials:   IV Rocephin 02/15/2019  IV Flagyl 02/15/2019   Subjective: Patient laying in bed.  Denies any nausea or emesis.  Denies any abdominal pain.  Sitting up in chair.  Denies any abdominal pain.  Patient noted to have a T-max of 103.2 at 2226 hrs. on 02/16/2019.  Clear liquids.  Ostomy with liquid stools.  Objective: Vitals:   02/17/19 0042 02/17/19 0314 02/17/19 0712 02/17/19 1414  BP: 114/71 (!) 128/94 120/90 111/72  Pulse: (!) 106 98 98 100  Resp: 16 16 16 18   Temp: (!) 100.6 F (38.1 C) 98 F (36.7 C) 98.6 F (37 C) 99.4 F (37.4 C)  TempSrc: Oral Oral Oral Oral  SpO2: 96% 98% 99% 100%  Weight:      Height:        Intake/Output Summary (Last 24 hours) at 02/17/2019 1955 Last data filed at 02/17/2019 1821 Gross per 24 hour  Intake 2534.8 ml  Output 1325 ml  Net 1209.8 ml   Filed Weights   02/15/19 0625  Weight: 56.7 kg    Examination:  General exam: Cachectic.  Frail.  Chronically ill-appearing.  NAD. Respiratory system: Lungs clear to auscultation bilaterally anterior lung fields.  No wheezes, no crackles, no rhonchi.  Normal respiratory effort.  Cardiovascular system: Regular rate and rhythm no murmurs rubs or gallops.  No JVD.  No lower extremity edema.  Gastrointestinal system: Abdomen is soft, nontender, nondistended, positive bowel sounds.  No rebound.  No guarding.  Colostomy bag with liquid stool.   Central nervous system: Alert and oriented. No focal neurological deficits. Extremities: Left upper extremity in cast.  Symmetric 5 x 5 power. Skin: No rashes, lesions or ulcers Psychiatry: Judgement and insight appear normal. Mood & affect appropriate.     Data Reviewed: I have personally reviewed following labs and imaging studies  CBC: Recent Labs  Lab 02/15/19 0429 02/16/19 0343 02/17/19 0400  WBC 7.7 7.7 9.2  HGB 8.6*  8.3* 8.5*  HCT 28.5* 28.1* 28.1*  MCV 84.6 85.9 84.1  PLT 159 179 616   Basic Metabolic Panel: Recent Labs  Lab 02/15/19 0429 02/16/19 0343 02/17/19 0400  NA 134* 136 133*  K 3.7 4.4 3.8  CL 102 105 102  CO2 25 23 23   GLUCOSE 107* 73 106*  BUN 10 12 8   CREATININE 0.88 0.98 0.91  CALCIUM 8.7* 8.2* 8.0*  MG  --  1.6* 1.9   GFR: Estimated Creatinine Clearance: 70.1 mL/min (by C-G formula based on SCr of 0.91 mg/dL). Liver Function Tests: Recent Labs  Lab 02/15/19 0429 02/16/19 0343  AST 25 19  ALT 15 13  ALKPHOS 165* 130*  BILITOT 0.5 0.6  PROT 7.0 6.7  ALBUMIN 2.6* 2.4*   Recent Labs  Lab 02/15/19 0429  LIPASE 28   No results for input(s): AMMONIA in the last 168 hours. Coagulation Profile: No results for input(s): INR, PROTIME in the last 168 hours. Cardiac Enzymes: No results for input(s): CKTOTAL, CKMB, CKMBINDEX, TROPONINI in the last 168 hours. BNP (last 3 results) No results for input(s): PROBNP in the  last 8760 hours. HbA1C: No results for input(s): HGBA1C in the last 72 hours. CBG: No results for input(s): GLUCAP in the last 168 hours. Lipid Profile: No results for input(s): CHOL, HDL, LDLCALC, TRIG, CHOLHDL, LDLDIRECT in the last 72 hours. Thyroid Function Tests: No results for input(s): TSH, T4TOTAL, FREET4, T3FREE, THYROIDAB in the last 72 hours. Anemia Panel: Recent Labs    02/16/19 1404  VITAMINB12 524  FOLATE 30.4  FERRITIN 277  TIBC 171*  IRON 9*   Sepsis Labs: Recent Labs  Lab 02/15/19 0430 02/15/19 3790  LATICACIDVEN 0.8 0.7    Recent Results (from the past 240 hour(s))  Blood Culture (routine x 2)     Status: None (Preliminary result)   Collection Time: 02/15/19  4:30 AM  Result Value Ref Range Status   Specimen Description BLOOD PORTA CATH  Final   Special Requests   Final    BOTTLES DRAWN AEROBIC AND ANAEROBIC Blood Culture adequate volume Performed at [facility] [address], [place],  [place] [address]    Culture NO GROWTH 2 DAYS  Final   Report Status PENDING  Incomplete  Blood Culture (routine x 2)     Status: None (Preliminary result)   Collection Time: 02/15/19  6:28 AM  Result Value Ref Range Status   Specimen Description BLOOD BLOOD RIGHT FOREARM  Final   Special Requests   Final    BOTTLES DRAWN AEROBIC AND ANAEROBIC Blood Culture adequate volume Performed at [facility] [address], [place], [place] [address]    Culture NO GROWTH 2 DAYS  Final   Report Status PENDING  Incomplete  Urine culture     Status: None   Collection Time: 02/15/19  6:28 AM  Result Value Ref Range Status   Specimen Description   Final    URINE, CLEAN CATCH Performed at [name], [place] [address], [place], [place] [address]    Special Requests   Final    NONE Performed at [facility], [place] [address], [place], [place] [address]    Culture   Final    NO GROWTH Performed at [facility], [place] [address], [place], [place] [address]    Report Status 02/16/2019 FINAL  Final         Radiology Studies: Dg Chest 2 View  Result Date: 02/16/2019 CLINICAL DATA:  Colon cancer. Weakness. EXAM: CHEST - 2 VIEW COMPARISON:  CT chest 12/19/2018. Chest radiograph 01/08/2018. FINDINGS: Unchanged Port-A-Cath. Normal heart size. No consolidation, edema, or pulmonary nodules. No visible free air. Apparent severe malnutrition. No acute osseous findings. IMPRESSION: 1. No active cardiopulmonary disease. Electronically Signed   By: Staci Righter M.D.   On: 02/16/2019 10:15        Scheduled Meds: . ferrous sulfate  325 mg Oral BID WC  . gabapentin  300 mg Oral TID  . mirtazapine  15 mg Oral QHS  . morphine  30 mg Oral Q12H  . oxyCODONE  10 mg Oral Q6H  . pantoprazole (PROTONIX) IV  40 mg Intravenous Q24H  . [START ON 02/18/2019] rivaroxaban  15 mg Oral Daily   Continuous Infusions: . sodium chloride 75 mL/hr at 02/17/19 1320  .  cefTRIAXone (ROCEPHIN)  IV 2 g (02/17/19 0754)  . metronidazole 500 mg (02/17/19 1805)     LOS: 2 days    Time spent: 35 minutes    Irine Seal, MD [facility]  If 7PM-7AM, please contact night-coverage  [email or web address] 02/17/2019, 7:55 PM

## 2019-02-18 LAB — MAGNESIUM: MAGNESIUM: 1.5 mg/dL — AB (ref 1.7–2.4)

## 2019-02-18 LAB — CBC WITH DIFFERENTIAL/PLATELET
Abs Immature Granulocytes: 0.12 10*3/uL — ABNORMAL HIGH (ref 0.00–0.07)
Basophils Absolute: 0 10*3/uL (ref 0.0–0.1)
Basophils Relative: 1 %
Eosinophils Absolute: 0.2 10*3/uL (ref 0.0–0.5)
Eosinophils Relative: 2 %
HCT: 28.2 % — ABNORMAL LOW (ref 39.0–52.0)
Hemoglobin: 8.4 g/dL — ABNORMAL LOW (ref 13.0–17.0)
Immature Granulocytes: 1 %
Lymphocytes Relative: 21 %
Lymphs Abs: 1.8 10*3/uL (ref 0.7–4.0)
MCH: 25.4 pg — AB (ref 26.0–34.0)
MCHC: 29.8 g/dL — ABNORMAL LOW (ref 30.0–36.0)
MCV: 85.2 fL (ref 80.0–100.0)
Monocytes Absolute: 1 10*3/uL (ref 0.1–1.0)
Monocytes Relative: 11 %
NEUTROS ABS: 5.7 10*3/uL (ref 1.7–7.7)
Neutrophils Relative %: 64 %
Platelets: 208 10*3/uL (ref 150–400)
RBC: 3.31 MIL/uL — ABNORMAL LOW (ref 4.22–5.81)
RDW: 19.1 % — ABNORMAL HIGH (ref 11.5–15.5)
WBC: 8.8 10*3/uL (ref 4.0–10.5)
nRBC: 0 % (ref 0.0–0.2)

## 2019-02-18 LAB — BASIC METABOLIC PANEL
Anion gap: 5 (ref 5–15)
CO2: 23 mmol/L (ref 22–32)
Calcium: 8.2 mg/dL — ABNORMAL LOW (ref 8.9–10.3)
Chloride: 106 mmol/L (ref 98–111)
Creatinine, Ser: 0.78 mg/dL (ref 0.61–1.24)
GFR calc Af Amer: >60 mL/min (ref >60)
GFR calc non Af Amer: >60 mL/min (ref >60)
GLUCOSE: 100 mg/dL — AB (ref 70–99)
Potassium: 3.6 mmol/L (ref 3.5–5.1)
Sodium: 134 mmol/L — ABNORMAL LOW (ref 135–145)

## 2019-02-18 MED ORDER — MAGNESIUM SULFATE 4 GM/100ML IV SOLN
4.0000 g | Freq: Once | INTRAVENOUS | Status: AC
  Administered 2019-02-18: 4 g via INTRAVENOUS
  Filled 2019-02-18: qty 100

## 2019-02-18 MED ORDER — PANTOPRAZOLE SODIUM 40 MG PO TBEC
40.0000 mg | DELAYED_RELEASE_TABLET | Freq: Every day | ORAL | Status: DC
  Administered 2019-02-18 – 2019-02-20 (×3): 40 mg via ORAL
  Filled 2019-02-18 (×3): qty 1

## 2019-02-18 NOTE — Progress Notes (Signed)
PROGRESS NOTE    Jeff [name]  RWE:[id number] [name]: 11/08/1961 [name]: 02/15/2019 PCP: Patient, [name]   Brief Narrative:  Patient is a 58 year old gentleman history of metastatic colon cancer being followed by oncology was on chemotherapy which is currently on hold, status post colostomy, history of PE and right lower extremity DVT status post thrombectomy, IVC filter placement and currently on Xarelto presented with abdominal pain on the left side of his abdomen with some nausea and no emesis.  CT scan done in the ED concerning for possible bowel perforation.  General surgery consulted.  Patient placed empirically on IV ciprofloxacin and IV Flagyl.   Assessment & Plan:   Principal Problem:   Bowel perforation (HCC) Active Problems:   Protein-calorie malnutrition, severe   Primary colon cancer with metastasis to other site [facility])   Anemia in neoplastic disease   Abdominal pain   Chronic anticoagulation   Lower leg DVT (deep venous thromboembolism), chronic, left (HCC)   Status post insertion of inferior vena caval filter   Colostomy in place [facility])   Malignant neoplasm of colon ([place])  1?  Colonic microperforation in the setting of known metastatic adenocarcinoma of the colon. Patient had presented with abdominal pain.  CT scan done was concerning for bowel perforation.  Patient on bowel rest.  Patient pancultured.  Patient placed empirically on IV Rocephin and Flagyl.  Patient seen in consultation by general surgery and was felt patient may have probable colonic micro perforation.  Patient improved clinically.  Patient with no further abdominal pain.  No nausea or emesis.  Tolerating full liquid diet.  Diet has been advanced to a soft diet.  General surgery. Continue empiric IV Rocephin and Flagyl.  General surgery following and appreciate input and recommendations.   2.  Metastatic colon cancer Patient was on chemotherapy which is currently on hold.  Patient was seen by his  oncologist, [name].  Outpatient follow-up.  3.  History of left lower extremity DVT and PE.   Patient status post thrombectomy.  Was on Xarelto which was held in anticipation of possible surgical intervention.  Patient improving clinically.  Patient on conservative treatment.  Patient was started back on prophylactic dose Lovenox and was to be started on Xarelto however patient received dose of Lovenox and as such was given full dose Lovenox on 02/17/2019.  Lovenox has been discontinued and patient will be started on Xarelto 15 mg daily due to patient's risk of GI bleed and per hematology/oncology recommendations.   4.  Severe protein calorie malnutrition Likely secondary to malignancy.  Once patient on a full diet will likely need nutritional supplementation.  5.  Normocytic anemia/iron deficiency anemia. Hemoglobin noted to be 8.6 on admission, which is lower than his baseline.  Hemoglobin currently at 8.4.  Patient with no overt bleeding.  Anemia panel with iron level of 9, TIBC of 171, folate of 30.4.  Vitamin B12 of 524.  May need oral iron supplementation on discharge.  May consider IV iron during this hospitalization or in the outpatient setting with follow-up with oncology.  6.  Cancer associated pain.   Patient was placed back on home regimen of morphine sulfate.  Patient was given some IV morphine the evening of 02/15/2019, however per RN patient stated was having some hallucinations.  IV morphine has been discontinued and patient placed on IV Dilaudid as needed.  Continue home regimen oxycodone.  Follow.  7.  Left wrist fracture Patient currently in a cast.  Outpatient  follow-up with orthopedics, Dr. Apolonio Schneiders.  8.  Hypomagnesemia Magnesium at 1.5 this morning.  We will give 4 g of IV magnesium x1.  Repeat labs in the morning.   9.  Fever Likely secondary to problem #1.  Fever curve trending down.  No leukocytosis.  Urinalysis unremarkable.  Chest x-ray negative for any acute  cardiopulmonary abnormality.  Blood cultures with no growth to date x3 days.  Urine cultures negative.  Continue current empiric IV antibiotics of Rocephin and Flagyl.   DVT prophylaxis: Lovenox Code Status: Full Family Communication: Updated patient and daughter at bedside. Disposition Plan: To be determined.  Likely home when clinically improved, afebrile for 24 to 48 hours and tolerating a solid diet when okay with general surgery..   Consultants:   General surgery: Dr. Ninfa Linden 02/15/2019  Oncology: [name] 02/16/2019  Procedures:   CT abdomen and pelvis 02/15/2019  Chest x-ray 02/16/2019  Antimicrobials:   IV Rocephin 02/15/2019  IV Flagyl 02/15/2019   Subjective: Patient in bed.  Daughter at bedside.  Denies any nausea or vomiting.  Denies any abdominal pain.  Fever curve trending down.  Tolerating full liquid diet.  Ostomy functioning.   Objective: Vitals:   02/17/19 0712 02/17/19 1414 02/17/19 2057 02/18/19 0452  BP: 120/90 111/72 111/80 117/82  Pulse: 98 100 (!) 101 100  Resp: 16 18 15 16   Temp: 98.6 F (37 C) 99.4 F (37.4 C) 100 F (37.8 C) 99.1 F (37.3 C)  TempSrc: Oral Oral Oral Oral  SpO2: 99% 100% 100% 100%  Weight:      Height:        Intake/Output Summary (Last 24 hours) at 02/18/2019 1214 Last data filed at 02/17/2019 2100 Gross per 24 hour  Intake 600 ml  Output 550 ml  Net 50 ml   Filed Weights   02/15/19 0625  Weight: 56.7 kg    Examination:  General exam: Cachectic.  Frail.  Chronically ill-appearing.  NAD. Respiratory system: CTAB.  No wheezes, no crackles, no rhonchi.  Normal respiratory effort.   Cardiovascular system: RRR no murmurs rubs or gallops.  No JVD.  No lower extremity edema.  Gastrointestinal system: Abdomen is nontender, nondistended, soft, positive bowel sounds.  No rebound.  No guarding.  Colostomy bag with liquid stool noted.  Central nervous system: Alert and oriented. No focal neurological deficits. Extremities:  Left upper extremity in cast.  Symmetric 5 x 5 power. Skin: No rashes, lesions or ulcers Psychiatry: Judgement and insight appear normal. Mood & affect appropriate.     Data Reviewed: I have personally reviewed following labs and imaging studies  CBC: Recent Labs  Lab 02/15/19 0429 02/16/19 0343 02/17/19 0400 02/18/19 0500  WBC 7.7 7.7 9.2 8.8  NEUTROABS  --   --   --  5.7  HGB 8.6* 8.3* 8.5* 8.4*  HCT 28.5* 28.1* 28.1* 28.2*  MCV 84.6 85.9 84.1 85.2  PLT 159 179 190 948   Basic Metabolic Panel: Recent Labs  Lab 02/15/19 0429 02/16/19 0343 02/17/19 0400 02/18/19 0500  NA 134* 136 133* 134*  K 3.7 4.4 3.8 3.6  CL 102 105 102 106  CO2 25 23 23 23   GLUCOSE 107* 73 106* 100*  BUN 10 12 8  <5*  CREATININE 0.88 0.98 0.91 0.78  CALCIUM 8.7* 8.2* 8.0* 8.2*  MG  --  1.6* 1.9 1.5*   GFR: Estimated Creatinine Clearance: 79.7 mL/min (by C-G formula based on SCr of 0.78 mg/dL). Liver Function Tests: Recent Labs  Lab  02/15/19 0429 02/16/19 0343  AST 25 19  ALT 15 13  ALKPHOS 165* 130*  BILITOT 0.5 0.6  PROT 7.0 6.7  ALBUMIN 2.6* 2.4*   Recent Labs  Lab 02/15/19 0429  LIPASE 28   No results for input(s): AMMONIA in the last 168 hours. Coagulation Profile: No results for input(s): INR, PROTIME in the last 168 hours. Cardiac Enzymes: No results for input(s): CKTOTAL, CKMB, CKMBINDEX, TROPONINI in the last 168 hours. BNP (last 3 results) No results for input(s): PROBNP in the last 8760 hours. HbA1C: No results for input(s): HGBA1C in the last 72 hours. CBG: No results for input(s): GLUCAP in the last 168 hours. Lipid Profile: No results for input(s): CHOL, HDL, LDLCALC, TRIG, CHOLHDL, LDLDIRECT in the last 72 hours. Thyroid Function Tests: No results for input(s): TSH, T4TOTAL, FREET4, T3FREE, THYROIDAB in the last 72 hours. Anemia Panel: Recent Labs    02/16/19 1404  VITAMINB12 524  FOLATE 30.4  FERRITIN 277  TIBC 171*  IRON 9*   Sepsis Labs: Recent  Labs  Lab 02/15/19 0430 02/15/19 2595  LATICACIDVEN 0.8 0.7    Recent Results (from the past 240 hour(s))  Blood Culture (routine x 2)     Status: None (Preliminary result)   Collection Time: 02/15/19  4:30 AM  Result Value Ref Range Status   Specimen Description BLOOD PORTA CATH  Final   Special Requests   Final    BOTTLES DRAWN AEROBIC AND ANAEROBIC Blood Culture adequate volume Performed at [facility] [address], [place], [place] [address]    Culture NO GROWTH 2 DAYS  Final   Report Status PENDING  Incomplete  Blood Culture (routine x 2)     Status: None (Preliminary result)   Collection Time: 02/15/19  6:28 AM  Result Value Ref Range Status   Specimen Description BLOOD BLOOD RIGHT FOREARM  Final   Special Requests   Final    BOTTLES DRAWN AEROBIC AND ANAEROBIC Blood Culture adequate volume Performed at [facility] [address] [place], [place] [address]    Culture NO GROWTH 2 DAYS  Final   Report Status PENDING  Incomplete  Urine culture     Status: None   Collection Time: 02/15/19  6:28 AM  Result Value Ref Range Status   Specimen Description   Final    URINE, CLEAN CATCH Performed at [facility], [place] [address], [place], [place] [address]    Special Requests   Final    NONE Performed at [facility], [place] [address] [place], [place] [address]    Culture   Final    NO GROWTH Performed at [facility], [place] [address] [place], [place] [address]    Report Status 02/16/2019 FINAL  Final         Radiology Studies: No results found.      Scheduled Meds: . ferrous sulfate  325 mg Oral BID WC  . gabapentin  300 mg Oral TID  . mirtazapine  15 mg Oral QHS  . morphine  30 mg Oral Q12H  . oxyCODONE  10 mg Oral Q6H  . pantoprazole  40 mg Oral Daily  . rivaroxaban  15 mg Oral Daily   Continuous Infusions: . cefTRIAXone (ROCEPHIN)  IV 2 g (02/18/19 0807)  .  magnesium sulfate 1 - 4 g bolus IVPB 4 g (02/18/19 1020)  . metronidazole 500 mg (02/18/19 0844)     LOS: 3 days  Time spent: 35 minutes    Irine Seal, MD [facility]  If 7PM-7AM, please contact night-coverage [email or web address] 02/18/2019, 12:14 PM

## 2019-02-18 NOTE — Progress Notes (Signed)
   Subjective/Chief Complaint: Complains that he is not getting enough food. Denies pain   Objective: Vital signs in last 24 hours: Temp:  [99.1 F (37.3 C)-100 F (37.8 C)] 99.1 F (37.3 C) (02/22 0452) Pulse Rate:  [100-101] 100 (02/22 0452) Resp:  [15-18] 16 (02/22 0452) BP: (111-117)/(72-82) 117/82 (02/22 0452) SpO2:  [100 %] 100 % (02/22 0452) Last BM Date: 02/17/19  Intake/Output from previous day: 02/21 0701 - 02/22 0700 In: 720 [P.O.:720] Out: 1100 [Urine:700; Stool:400] Intake/Output this shift: No intake/output data recorded.  General appearance: alert and cooperative Resp: clear to auscultation bilaterally Cardio: regular rate and rhythm GI: soft, nontender. ostomy productive  Lab Results:  Recent Labs    02/17/19 0400 02/18/19 0500  WBC 9.2 8.8  HGB 8.5* 8.4*  HCT 28.1* 28.2*  PLT 190 208   BMET Recent Labs    02/17/19 0400 02/18/19 0500  NA 133* 134*  K 3.8 3.6  CL 102 106  CO2 23 23  GLUCOSE 106* 100*  BUN 8 <5*  CREATININE 0.91 0.78  CALCIUM 8.0* 8.2*   PT/INR No results for input(s): LABPROT, INR in the last 72 hours. ABG No results for input(s): PHART, HCO3 in the last 72 hours.  Invalid input(s): PCO2, PO2  Studies/Results: Dg Chest 2 View  Result Date: 02/16/2019 CLINICAL DATA:  Colon cancer. Weakness. EXAM: CHEST - 2 VIEW COMPARISON:  CT chest 12/19/2018. Chest radiograph 01/08/2018. FINDINGS: Unchanged Port-A-Cath. Normal heart size. No consolidation, edema, or pulmonary nodules. No visible free air. Apparent severe malnutrition. No acute osseous findings. IMPRESSION: 1. No active cardiopulmonary disease. Electronically Signed   By: Staci Righter M.D.   On: 02/16/2019 10:15    Anti-infectives: Anti-infectives (From admission, onward)   Start     Dose/Rate Route Frequency Ordered Stop   02/16/19 0800  cefTRIAXone (ROCEPHIN) 2 g in sodium chloride 0.9 % 100 mL IVPB     2 g 200 mL/hr over 30 Minutes Intravenous Every 24 hours  02/15/19 1044     02/15/19 0700  metroNIDAZOLE (FLAGYL) IVPB 500 mg     500 mg 100 mL/hr over 60 Minutes Intravenous Every 8 hours 02/15/19 0646     02/15/19 0700  cefTRIAXone (ROCEPHIN) 2 g in sodium chloride 0.9 % 100 mL IVPB     2 g 200 mL/hr over 30 Minutes Intravenous  Once 02/15/19 0646 02/15/19 0817      Assessment/Plan: s/p * No surgery found * Advance diet  DVT/PE on Xarelto - last dose this AM Severe malnutrition/deconditioning Hx pancytopenia on chemotherapy Anemia Recent left radial fracture Chronic pain on morphine and oxycodone   Abdominal discomfort with possible colonic microperforation Metastatic adenocarcinoma of the colon 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 [name] -Chemotherapy Dr. 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 Advance to soft diet   FEN: IV fluidsNS @ 100 ml/hr >>soft diet ID: Flagyl/Cipro 2/19 >>day 3 DVT: SCD- he can be on heparin or Lovenox from our standpoint, hold Xarelto until we are sure he does not need surgery Follow up: TBD  Plan:  Full liquids, advance to soft in AM. Will defer w/u of fever to Dr. Grandville Silos.  I put in another order for IS.  LOS: 3 days    Jeff [name] 02/18/2019

## 2019-02-19 LAB — CBC
HCT: 27.7 % — ABNORMAL LOW (ref 39.0–52.0)
Hemoglobin: 8.2 g/dL — ABNORMAL LOW (ref 13.0–17.0)
MCH: 25.3 pg — AB (ref 26.0–34.0)
MCHC: 29.6 g/dL — AB (ref 30.0–36.0)
MCV: 85.5 fL (ref 80.0–100.0)
Platelets: 219 10*3/uL (ref 150–400)
RBC: 3.24 MIL/uL — ABNORMAL LOW (ref 4.22–5.81)
RDW: 18.9 % — ABNORMAL HIGH (ref 11.5–15.5)
WBC: 7.7 10*3/uL (ref 4.0–10.5)
nRBC: 0 % (ref 0.0–0.2)

## 2019-02-19 LAB — BASIC METABOLIC PANEL
Anion gap: 7 (ref 5–15)
BUN: <5 mg/dL — ABNORMAL LOW (ref 6–20)
CO2: 23 mmol/L (ref 22–32)
Calcium: 8 mg/dL — ABNORMAL LOW (ref 8.9–10.3)
Chloride: 104 mmol/L (ref 98–111)
Creatinine, Ser: 0.68 mg/dL (ref 0.61–1.24)
GFR calc Af Amer: >60 mL/min (ref >60)
GFR calc non Af Amer: >60 mL/min (ref >60)
GLUCOSE: 116 mg/dL — AB (ref 70–99)
Potassium: 3.2 mmol/L — ABNORMAL LOW (ref 3.5–5.1)
Sodium: 134 mmol/L — ABNORMAL LOW (ref 135–145)

## 2019-02-19 LAB — MAGNESIUM: Magnesium: 1.9 mg/dL (ref 1.7–2.4)

## 2019-02-19 MED ORDER — METRONIDAZOLE IN NACL 5-0.79 MG/ML-% IV SOLN
500.0000 mg | Freq: Three times a day (TID) | INTRAVENOUS | Status: DC
  Administered 2019-02-19 – 2019-02-20 (×4): 500 mg via INTRAVENOUS
  Filled 2019-02-19 (×4): qty 100

## 2019-02-19 MED ORDER — SODIUM CHLORIDE 0.9 % IV SOLN
510.0000 mg | Freq: Once | INTRAVENOUS | Status: AC
  Administered 2019-02-19: 510 mg via INTRAVENOUS
  Filled 2019-02-19: qty 17

## 2019-02-19 MED ORDER — POTASSIUM CHLORIDE CRYS ER 20 MEQ PO TBCR
40.0000 meq | EXTENDED_RELEASE_TABLET | ORAL | Status: AC
  Administered 2019-02-19 (×2): 40 meq via ORAL
  Filled 2019-02-19 (×2): qty 2

## 2019-02-19 NOTE — Progress Notes (Addendum)
PROGRESS NOTE    Jeff [name]  UKG:[id number] [name]: 1961/02/25 [name]: 02/15/2019 PCP: Patient, [name]   Brief Narrative:  Patient is a 58 year old gentleman history of metastatic colon cancer being followed by oncology was on chemotherapy which is currently on hold, status post colostomy, history of PE and right lower extremity DVT status post thrombectomy, IVC filter placement and currently on Xarelto presented with abdominal pain on the left side of his abdomen with some nausea and no emesis.  CT scan done in the ED concerning for possible bowel perforation.  General surgery consulted.  Patient placed empirically on IV ciprofloxacin and IV Flagyl.   Assessment & Plan:   Principal Problem:   Bowel perforation (HCC) Active Problems:   Protein-calorie malnutrition, severe   Primary colon cancer with metastasis to other site [facility])   Anemia in neoplastic disease   Abdominal pain   Chronic anticoagulation   Lower leg DVT (deep venous thromboembolism), chronic, left (HCC)   Status post insertion of inferior vena caval filter   Colostomy in place [facility])   Malignant neoplasm of colon ([place])  1?  Colonic microperforation in the setting of known metastatic adenocarcinoma of the colon. Patient had presented with abdominal pain.  CT scan done was concerning for bowel perforation.  Patient on bowel rest.  Patient pancultured.  Patient placed empirically on IV Rocephin and Flagyl.  Patient seen in consultation by general surgery and was felt patient may have probable colonic micro perforation.  Patient improved clinically.  Patient with no further abdominal pain.  No nausea or emesis.  Tolerating soft diet. General surgery. Continue empiric IV Rocephin and Flagyl.  General surgery following and appreciate input and recommendations.   2.  Metastatic colon cancer Patient was on chemotherapy which is currently on hold.  Patient was seen by his oncologist, [name].  Outpatient follow-up.  3.   History of left lower extremity DVT and PE.   Patient status post thrombectomy.  Was on Xarelto which was held in anticipation of possible surgical intervention.  Patient improving clinically.  Patient on conservative treatment.  Patient was started back on prophylactic dose Lovenox and was to be started on Xarelto however patient received dose of Lovenox and as such was given full dose Lovenox on 02/17/2019.  Lovenox has been discontinued and patient will be started back on Xarelto 15 mg daily due to patient's risk of GI bleed and per hematology/oncology recommendations.   4.  Severe protein calorie malnutrition Likely secondary to malignancy.  Patient has been advanced to a soft diet.  Follow.   5.  Normocytic anemia/iron deficiency anemia. Hemoglobin noted to be 8.6 on admission, which is lower than his baseline.  Hemoglobin currently at 8.2.  Patient with no overt bleeding.  Anemia panel with iron level of 9, TIBC of 171, folate of 30.4.  Vitamin B12 of 524.  May need oral iron supplementation on discharge.  We will give a dose of IV Feraheme.  Outpatient follow-up with hematology/oncology.    6.  Cancer associated pain.   Patient was placed back on home regimen of morphine sulfate.  Patient was given some IV morphine the evening of 02/15/2019, however per RN patient stated was having some hallucinations.  IV morphine has been discontinued and patient placed on IV Dilaudid as needed.  Continue home regimen oxycodone.  Follow.  7.  Left wrist fracture Patient currently in a cast.  Outpatient follow-up with orthopedics, Dr. Apolonio Schneiders.  8.  Hypomagnesemia Magnesium at  1.9 this morning.  Follow.  9.  Hypokalemia Replete.  10.  Fever Likely secondary to problem #1.  Fever curve trending down.  No leukocytosis.  Urinalysis unremarkable.  Chest x-ray negative for any acute cardiopulmonary abnormality.  Blood cultures with no growth to date x3 days.  Urine cultures negative.  Continue current empiric IV  antibiotics of Rocephin and Flagyl.   DVT prophylaxis: Lovenox Code Status: Full Family Communication: Updated patient.  No family at bedside.  Disposition Plan: To be determined.  Likely home when clinically improved, afebrile for 24 to 48 hours and tolerating a solid diet when okay with general surgery hopefully tomorrow..   Consultants:   General surgery: Dr. Ninfa Linden 02/15/2019  Oncology: [name] 02/16/2019  Procedures:   CT abdomen and pelvis 02/15/2019  Chest x-ray 02/16/2019  Antimicrobials:   IV Rocephin 02/15/2019  IV Flagyl 02/15/2019>> oral Flagyl 02/19/2019   Subjective: Patient laying in bed.  No family at bedside.  Denies any nausea or vomiting.  No abdominal pain.  Tolerating current soft diet.  Ostomy functioning.  Fever curve trending down.   Objective: Vitals:   02/18/19 1331 02/18/19 2127 02/19/19 0041 02/19/19 0558  BP: 112/75 118/83  133/88  Pulse: (!) 103 95  91  Resp: 16 16  19   Temp: 98.9 F (37.2 C) 100.1 F (37.8 C) 99.3 F (37.4 C) 99 F (37.2 C)  TempSrc: Oral Oral Oral Oral  SpO2: 98% 100%  100%  Weight:      Height:        Intake/Output Summary (Last 24 hours) at 02/19/2019 1235 Last data filed at 02/19/2019 0920 Gross per 24 hour  Intake 1000 ml  Output 800 ml  Net 200 ml   Filed Weights   02/15/19 0625  Weight: 56.7 kg    Examination:  General exam: Chronically ill-appearing.  Cachectic.  Frail. Respiratory system: Lungs clear to auscultation bilaterally anterior lung fields.  No wheezes, no crackles, no rhonchi.  Normal respiratory effort.  Cardiovascular system: Regular rate rhythm no murmurs rubs or gallops.  No JVD.  No lower extremity edema. Gastrointestinal system: Abdomen is soft, nontender, nondistended, positive bowel sounds.  No rebound.  No guarding.  Colostomy bag with some liquid stool.  Central nervous system: Alert.  Oriented.  No focal neurological deficits.  Moving extremities spontaneously.   Extremities:  Left upper extremity in cast.  Symmetric 5 x 5 power. Skin: No rashes, lesions or ulcers Psychiatry: Judgement and insight appear normal. Mood & affect appropriate.     Data Reviewed: I have personally reviewed following labs and imaging studies  CBC: Recent Labs  Lab 02/15/19 0429 02/16/19 0343 02/17/19 0400 02/18/19 0500 02/19/19 0546  WBC 7.7 7.7 9.2 8.8 7.7  NEUTROABS  --   --   --  5.7  --   HGB 8.6* 8.3* 8.5* 8.4* 8.2*  HCT 28.5* 28.1* 28.1* 28.2* 27.7*  MCV 84.6 85.9 84.1 85.2 85.5  PLT 159 179 190 208 056   Basic Metabolic Panel: Recent Labs  Lab 02/15/19 0429 02/16/19 0343 02/17/19 0400 02/18/19 0500 02/19/19 0546  NA 134* 136 133* 134* 134*  K 3.7 4.4 3.8 3.6 3.2*  CL 102 105 102 106 104  CO2 25 23 23 23 23   GLUCOSE 107* 73 106* 100* 116*  BUN 10 12 8  <5* <5*  CREATININE 0.88 0.98 0.91 0.78 0.68  CALCIUM 8.7* 8.2* 8.0* 8.2* 8.0*  MG  --  1.6* 1.9 1.5* 1.9   GFR: Estimated  Creatinine Clearance: 79.7 mL/min (by C-G formula based on SCr of 0.68 mg/dL). Liver Function Tests: Recent Labs  Lab 02/15/19 0429 02/16/19 0343  AST 25 19  ALT 15 13  ALKPHOS 165* 130*  BILITOT 0.5 0.6  PROT 7.0 6.7  ALBUMIN 2.6* 2.4*   Recent Labs  Lab 02/15/19 0429  LIPASE 28   No results for input(s): AMMONIA in the last 168 hours. Coagulation Profile: No results for input(s): INR, PROTIME in the last 168 hours. Cardiac Enzymes: No results for input(s): CKTOTAL, CKMB, CKMBINDEX, TROPONINI in the last 168 hours. BNP (last 3 results) No results for input(s): PROBNP in the last 8760 hours. HbA1C: No results for input(s): HGBA1C in the last 72 hours. CBG: No results for input(s): GLUCAP in the last 168 hours. Lipid Profile: No results for input(s): CHOL, HDL, LDLCALC, TRIG, CHOLHDL, LDLDIRECT in the last 72 hours. Thyroid Function Tests: No results for input(s): TSH, T4TOTAL, FREET4, T3FREE, THYROIDAB in the last 72 hours. Anemia Panel: Recent Labs     02/16/19 1404  VITAMINB12 524  FOLATE 30.4  FERRITIN 277  TIBC 171*  IRON 9*   Sepsis Labs: Recent Labs  Lab 02/15/19 0430 02/15/19 9622  LATICACIDVEN 0.8 0.7    Recent Results (from the past 240 hour(s))  Blood Culture (routine x 2)     Status: None (Preliminary result)   Collection Time: 02/15/19  4:30 AM  Result Value Ref Range Status   Specimen Description BLOOD PORTA CATH  Final   Special Requests   Final    BOTTLES DRAWN AEROBIC AND ANAEROBIC Blood Culture adequate volume Performed at [facility] [address] [place], [place] [address]    Culture NO GROWTH 4 DAYS  Final   Report Status PENDING  Incomplete  Blood Culture (routine x 2)     Status: None (Preliminary result)   Collection Time: 02/15/19  6:28 AM  Result Value Ref Range Status   Specimen Description BLOOD BLOOD RIGHT FOREARM  Final   Special Requests   Final    BOTTLES DRAWN AEROBIC AND ANAEROBIC Blood Culture adequate volume Performed at [facility] [address] [place], [place] [address]    Culture NO GROWTH 4 DAYS  Final   Report Status PENDING  Incomplete  Urine culture     Status: None   Collection Time: 02/15/19  6:28 AM  Result Value Ref Range Status   Specimen Description   Final    URINE, CLEAN CATCH Performed at [facility], [place] [address], [place], [place] [address]    Special Requests   Final    NONE Performed at [facility], [place] [address], [place], [place] [address]    Culture   Final    NO GROWTH Performed at [facility], [place] [address] [place], [place] [address]    Report Status 02/16/2019 FINAL  Final         Radiology Studies: No results found.      Scheduled Meds: . ferrous sulfate  325 mg Oral BID WC  . gabapentin  300 mg Oral TID  . mirtazapine  15 mg Oral QHS  . morphine  30 mg Oral Q12H  . oxyCODONE  10 mg Oral Q6H  . pantoprazole  40 mg Oral Daily  .  rivaroxaban  15 mg Oral Daily   Continuous Infusions: . cefTRIAXone (ROCEPHIN)  IV 2 g (02/19/19 0713)  . metronidazole 500 mg (02/19/19 1228)  LOS: 4 days    Time spent: 35 minutes    Irine Seal, MD [facility]  If 7PM-7AM, please contact night-coverage [email or web address] 02/19/2019, 12:35 PM

## 2019-02-19 NOTE — Progress Notes (Signed)
PHARMACIST - PHYSICIAN COMMUNICATION DR:   Grandville Silos CONCERNING: Antibiotic IV to Oral Route Change Policy  RECOMMENDATION: This patient is receiving flagyl by the intravenous route.  Based on criteria approved by the Pharmacy and Therapeutics Committee, the antibiotic(s) is/are being converted to the equivalent oral dose form(s).   DESCRIPTION: These criteria include:  Patient being treated for a respiratory tract infection, urinary tract infection, cellulitis or clostridium difficile associated diarrhea if on metronidazole  The patient is not neutropenic and does not exhibit a GI malabsorption state  The patient is eating (either orally or via tube) and/or has been taking other orally administered medications for a least 24 hours  The patient is improving clinically and has a Tmax < 100.5  If you have questions about this conversion, please contact the Pharmacy Department  []   [phone] )  [facility] []   [phone] )  [facility] []   [phone] )  [facility] []   [phone] )  [facility] [x]   [phone] )  [place], PharmD, BCPS 02/19/2019 7:57 AM

## 2019-02-19 NOTE — Progress Notes (Signed)
   Subjective/Chief Complaint: No complaints. Happy to have a diet   Objective: Vital signs in last 24 hours: Temp:  [98.9 F (37.2 C)-100.1 F (37.8 C)] 99 F (37.2 C) (02/23 0558) Pulse Rate:  [91-103] 91 (02/23 0558) Resp:  [16-19] 19 (02/23 0558) BP: (112-133)/(75-88) 133/88 (02/23 0558) SpO2:  [98 %-100 %] 100 % (02/23 0558) Last BM Date: 02/18/19  Intake/Output from previous day: 02/22 0701 - 02/23 0700 In: 850 [P.O.:150; IV Piggyback:700] Out: 800 [Urine:800] Intake/Output this shift: No intake/output data recorded.  General appearance: alert and cooperative Resp: clear to auscultation bilaterally Cardio: regular rate and rhythm GI: soft, nontender  Lab Results:  Recent Labs    02/18/19 0500 02/19/19 0546  WBC 8.8 7.7  HGB 8.4* 8.2*  HCT 28.2* 27.7*  PLT 208 219   BMET Recent Labs    02/18/19 0500 02/19/19 0546  NA 134* 134*  K 3.6 3.2*  CL 106 104  CO2 23 23  GLUCOSE 100* 116*  BUN <5* <5*  CREATININE 0.78 0.68  CALCIUM 8.2* 8.0*   PT/INR No results for input(s): LABPROT, INR in the last 72 hours. ABG No results for input(s): PHART, HCO3 in the last 72 hours.  Invalid input(s): PCO2, PO2  Studies/Results: No results found.  Anti-infectives: Anti-infectives (From admission, onward)   Start     Dose/Rate Route Frequency Ordered Stop   02/16/19 0800  cefTRIAXone (ROCEPHIN) 2 g in sodium chloride 0.9 % 100 mL IVPB     2 g 200 mL/hr over 30 Minutes Intravenous Every 24 hours 02/15/19 1044     02/15/19 0700  metroNIDAZOLE (FLAGYL) IVPB 500 mg     500 mg 100 mL/hr over 60 Minutes Intravenous Every 8 hours 02/15/19 0646     02/15/19 0700  cefTRIAXone (ROCEPHIN) 2 g in sodium chloride 0.9 % 100 mL IVPB     2 g 200 mL/hr over 30 Minutes Intravenous  Once 02/15/19 0646 02/15/19 0817      Assessment/Plan: s/p * No surgery found * Advance diet  Likely home when medically stable With no fever or wbc would probably stop abx at discharge  unless oncology wants him on them for empiric reasons especially if they are going to give him chemo  LOS: 4 days    Autumn Messing III 02/19/2019

## 2019-02-20 DIAGNOSIS — R509 Fever, unspecified: Secondary | ICD-10-CM

## 2019-02-20 LAB — CBC
HCT: 27.5 % — ABNORMAL LOW (ref 39.0–52.0)
HEMOGLOBIN: 8.2 g/dL — AB (ref 13.0–17.0)
MCH: 25.2 pg — ABNORMAL LOW (ref 26.0–34.0)
MCHC: 29.8 g/dL — ABNORMAL LOW (ref 30.0–36.0)
MCV: 84.6 fL (ref 80.0–100.0)
Platelets: 228 10*3/uL (ref 150–400)
RBC: 3.25 MIL/uL — ABNORMAL LOW (ref 4.22–5.81)
RDW: 19 % — ABNORMAL HIGH (ref 11.5–15.5)
WBC: 8 10*3/uL (ref 4.0–10.5)
nRBC: 0 % (ref 0.0–0.2)

## 2019-02-20 LAB — BASIC METABOLIC PANEL
Anion gap: 5 (ref 5–15)
CO2: 24 mmol/L (ref 22–32)
Calcium: 8.2 mg/dL — ABNORMAL LOW (ref 8.9–10.3)
Chloride: 106 mmol/L (ref 98–111)
Creatinine, Ser: 0.77 mg/dL (ref 0.61–1.24)
GFR calc Af Amer: >60 mL/min (ref >60)
GFR calc non Af Amer: >60 mL/min (ref >60)
Glucose, Bld: 100 mg/dL — ABNORMAL HIGH (ref 70–99)
POTASSIUM: 4 mmol/L (ref 3.5–5.1)
Sodium: 135 mmol/L (ref 135–145)

## 2019-02-20 LAB — CULTURE, BLOOD (ROUTINE X 2)
CULTURE: NO GROWTH
Culture: NO GROWTH
Special Requests: ADEQUATE
Special Requests: ADEQUATE

## 2019-02-20 MED ORDER — RIVAROXABAN 20 MG PO TABS: 20.0000 mg | ORAL_TABLET | Freq: Every day | ORAL | 0 refills | Status: DC

## 2019-02-20 MED ORDER — HEPARIN SOD (PORK) LOCK FLUSH 100 UNIT/ML IV SOLN
500.0000 [IU] | INTRAVENOUS | Status: DC | PRN
  Filled 2019-02-20: qty 5

## 2019-02-20 MED ORDER — RIVAROXABAN 15 MG PO TABS: 15.0000 mg | ORAL_TABLET | Freq: Every day | ORAL | 1 refills | Status: DC

## 2019-02-20 MED ORDER — HEPARIN SOD (PORK) LOCK FLUSH 100 UNIT/ML IV SOLN: 500.0000 [IU] | INTRAVENOUS | Status: DC

## 2019-02-20 NOTE — Progress Notes (Signed)
[facility] Progress Note     Subjective: CC-  Sitting up in bed. Waiting on breakfast. Denies any abdominal pain. Appetite is back to normal. States that he ate pot roast and mac and cheese for dinner last night without increased abdominal pain, nausea, or vomiting. Ostomy functioning. Hoping to go home today. WBC 8, TMAX 99.  Objective: Vital signs in last 24 hours: Temp:  [98.7 F (37.1 C)-99 F (37.2 C)] 99 F (37.2 C) (02/24 0612) Pulse Rate:  [91-98] 94 (02/24 0612) Resp:  [17-18] 17 (02/24 0612) BP: (107-117)/(82-87) 116/84 (02/24 0612) SpO2:  [100 %] 100 % (02/24 0612) Last BM Date: 02/19/19  Intake/Output from previous day: 02/23 0701 - 02/24 0700 In: 550 [P.O.:350; IV Piggyback:200] Out: 450 [Urine:400; Stool:50] Intake/Output this shift: No intake/output data recorded.  PE: Gen:  Alert, NAD HEENT: EOM's intact, pupils equal and round Pulm:  effort normal Abd: Soft, NT/ND, +BS, no HSM, some stool in ostomy bag Skin: warm and dry  Lab Results:  Recent Labs    02/19/19 0546 02/20/19 0409  WBC 7.7 8.0  HGB 8.2* 8.2*  HCT 27.7* 27.5*  PLT 219 228   BMET Recent Labs    02/19/19 0546 02/20/19 0409  NA 134* 135  K 3.2* 4.0  CL 104 106  CO2 23 24  GLUCOSE 116* 100*  BUN <5* <5*  CREATININE 0.68 0.77  CALCIUM 8.0* 8.2*   PT/INR No results for input(s): LABPROT, INR in the last 72 hours. CMP     Component Value Date/Time   NA 135 02/20/2019 0409   NA 134 (L) 12/27/2017 0820   K 4.0 02/20/2019 0409   K 3.2 (L) 12/27/2017 0820   CL 106 02/20/2019 0409   CO2 24 02/20/2019 0409   CO2 23 12/27/2017 0820   GLUCOSE 100 (H) 02/20/2019 0409   GLUCOSE 135 12/27/2017 0820   BUN <5 (L) 02/20/2019 0409   BUN 11.3 12/27/2017 0820   CREATININE 0.77 02/20/2019 0409   CREATININE 0.80 01/26/2019 1013   CREATININE 0.9 12/27/2017 0820   CALCIUM 8.2 (L) 02/20/2019 0409   CALCIUM 8.3 (L) 12/27/2017 0820   PROT 6.7 02/16/2019 0343   PROT 7.8  12/27/2017 0820   ALBUMIN 2.4 (L) 02/16/2019 0343   ALBUMIN 3.0 (L) 12/27/2017 0820   AST 19 02/16/2019 0343   AST 12 (L) 01/26/2019 1013   AST 16 12/27/2017 0820   ALT 13 02/16/2019 0343   ALT <6 01/26/2019 1013   ALT 6 12/27/2017 0820   ALKPHOS 130 (H) 02/16/2019 0343   ALKPHOS 69 12/27/2017 0820   BILITOT 0.6 02/16/2019 0343   BILITOT <0.2 (L) 01/26/2019 1013   BILITOT 0.52 12/27/2017 0820   GFRNONAA >60 02/20/2019 0409   GFRNONAA >60 01/26/2019 1013   GFRAA >60 02/20/2019 0409   GFRAA >60 01/26/2019 1013   Lipase     Component Value Date/Time   LIPASE 28 02/15/2019 0429       Studies/Results: No results found.  Anti-infectives: Anti-infectives (From admission, onward)   Start     Dose/Rate Route Frequency Ordered Stop   02/19/19 1200  metroNIDAZOLE (FLAGYL) IVPB 500 mg     500 mg 100 mL/hr over 60 Minutes Intravenous Every 8 hours 02/19/19 0758     02/16/19 0800  cefTRIAXone (ROCEPHIN) 2 g in sodium chloride 0.9 % 100 mL IVPB     2 g 200 mL/hr over 30 Minutes Intravenous Every 24 hours 02/15/19 1044     02/15/19  0700  metroNIDAZOLE (FLAGYL) IVPB 500 mg  Status:  Discontinued     500 mg 100 mL/hr over 60 Minutes Intravenous Every 8 hours 02/15/19 0646 02/19/19 0758   02/15/19 0700  cefTRIAXone (ROCEPHIN) 2 g in sodium chloride 0.9 % 100 mL IVPB     2 g 200 mL/hr over 30 Minutes Intravenous  Once 02/15/19 0646 02/15/19 0817       Assessment/Plan DVT/PE on Xarelto Severe malnutrition/deconditioning Hx pancytopenia on chemotherapy Anemia Recent left radial fracture Chronic pain on morphine and oxycodone  Abdominal discomfort with possible colonic microperforation Metastatic adenocarcinoma of the colon with 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 [name] -Chemotherapy Dr. 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 WBC WNL, afebrile. Stable for discharge from surgical standpoint. Ok to stop abx at discharge unless oncology wants him on them for empiric reasons. He does not need any surgical follow up.   LOS: 5 days    Wellington Hampshire , [facility] 02/20/2019, 8:37 AM Pager: [phone] Mon-Thurs 7:00 am-4:30 pm Fri 7:00 am -11:30 AM Sat-Sun 7:00 am-11:30 am

## 2019-02-20 NOTE — Discharge Summary (Signed)
Physician Discharge Summary  Jeff [name] QAS:[id number] [name]: [birth year] [name]: 02/15/2019  Jeff [name]  Admit date: 02/15/2019 Discharge date: 02/20/2019  Time spent: 50 minutes  Recommendations for Outpatient Follow-up:  1. Follow-up with Jeff [name], hematology/oncology as scheduled. 2. Follow-up with PCP in 2 weeks. 3. Follow-up with Jeff [name], orthopedics as scheduled.   Discharge Diagnoses:  Principal Problem:   Bowel perforation (HCC) Active Problems:   Protein-calorie malnutrition, severe   Primary colon cancer with metastasis to other site [facility])   Anemia in neoplastic disease   Abdominal pain   Chronic anticoagulation   Lower leg DVT (deep venous thromboembolism), chronic, left (HCC)   Status post insertion of inferior vena caval filter   Colostomy in place [facility])   Malignant neoplasm of colon ([place])   Fever   Discharge Condition: Stable and improved  Diet recommendation: Regular  Filed Weights   02/15/19 0625  Weight: 56.7 kg    History of present illness:  [name] Jeff [name] is a 58 y.o. male with a past medical history of metastatic colon cancer, followed by medical oncology, had been receiving chemotherapy which is on hold currently.  Patient has a colostomy.  Patient also has a history of PE and a right lower extremity DVT and is status post thrombectomy, IVC filter placement and currently on Xarelto. Patient presented with abdominal pain worse than usual.  Located in the left side of the abdomen.  Associated with some nausea but no vomiting.  No fever no chills.  No dizziness or lightheadedness.  Since his symptoms were not improving he decided to come into the emergency department.  In the emergency department patient underwent a CT scan which raised concern for bowel perforation.  He will need hospitalization for further management.  Hospital Course:  1?  Colonic microperforation in the setting of known metastatic adenocarcinoma of  the colon. Patient had presented with abdominal pain.  CT scan done was concerning for bowel perforation.  Patient was initially placed on bowel rest, pancultured and placed empirically on IV Rocephin and Flagyl.  Patient seen in consultation by general surgery and was felt patient may have probable colonic micro perforation.  Patient was followed by general surgery during the hospitalization. Patient improved clinically.  Patient with no further abdominal pain.  No nausea or emesis.  Patient started on a clear liquid diet and subsequently advanced to a soft diet which he tolerated.  Patient received about 5 days of empiric IV antibiotics.  Patient remained afebrile.  No leukocytosis.  Improving clinically and tolerating a diet by day of discharge.  Patient needs no further antibiotics on discharge and be discharged home in stable and improved condition.  Outpatient follow-up with PCP.  2.  Metastatic colon cancer Patient was on chemotherapy which has been on hold due to patient's recent wrist fracture. Patient was seen by his oncologist, Jeff [name].  Outpatient follow-up.  3.  History of left lower extremity DVT and PE.   Patient status post thrombectomy.  Was on Xarelto which was held in anticipation of possible surgical intervention.  Patient improved clinically on empiric IV antibiotics.  Patient on conservative treatment.  Patient was started back on prophylactic dose Lovenox and was to be started on Xarelto however patient received dose of Lovenox and as such was given full dose Lovenox on 02/17/2019.  Lovenox was subsequently discontinued and patient started on Xarelto 15 mg daily due to patient's risk of GI bleeding [name] hematology/oncology recommendations.  Patient will be discharged home on Xarelto 20 mg daily as directed by hematology/oncology.  Outpatient follow-up.   4.  Severe protein calorie malnutrition Likely secondary to malignancy.  Patient has been advanced to a soft diet.  Patient will  be discharged on nutritional supplementation.   5.  Normocytic anemia/iron deficiency anemia. Hemoglobin noted to be 8.6 on admission, which was lower than his baseline.  Hemoglobin stabilized at 8.2.  Patient with no overt bleeding.  Anemia panel with iron level of 9, TIBC of 171, folate of 30.4.  Vitamin B12 of 524.    Patient was given IV Feraheme during the hospitalization.  Outpatient follow-up with hematology/oncology.     6.  Cancer associated pain.   Patient was placed back on home regimen of morphine sulfate.  Patient was given some IV morphine the evening of 02/15/2019, however per RN patient stated was having some hallucinations.  IV morphine has been discontinued and patient placed on IV Dilaudid as needed.  Patient resumed back on home regimen of oxycodone.  Patient's pain was controlled during the hospitalization.  Outpatient follow-up.  7.  Left wrist fracture Patient was in a cast.  Outpatient follow-up with orthopedics, Jeff [name].  8.  Hypomagnesemia Magnesium was repleted.  Outpatient follow-up.   9.  Hypokalemia Repleted.  10.  Fever Likely secondary to problem #1.  Fever curve trended down.  No leukocytosis.  Urinalysis unremarkable.  Chest x-ray negative for any acute cardiopulmonary abnormality.  Blood cultures with no growth to date x45 days.  Urine cultures negative.    Patient was placed empirically on IV Rocephin and IV Flagyl on admission and improved clinically during the hospitalization.  Patient was followed by general surgery.  Patient had remained afebrile during greater than 48 hours prior to discharge.  No antibiotics needed on discharge.   Procedures:  CT abdomen and pelvis 02/15/2019  Chest x-ray 02/16/2019  Consultations:  General surgery: Jeff [name] 02/15/2019  Oncology: Jeff [name] 02/16/2019   Discharge Exam: Vitals:   02/20/19 0612 02/20/19 1407  BP: 116/84 102/68  Pulse: 94 99  Resp: 17 16  Temp: 99 F (37.2 C) 98.7 F (37.1 C)   SpO2: 100% 96%    General: NAD Cardiovascular: RRR Respiratory: CTAB  Discharge Instructions   Discharge Instructions    Diet general   Complete by:  As directed    Increase activity slowly   Complete by:  As directed      Allergies as of 02/20/2019   No Known Allergies     Medication List    STOP taking these medications   nystatin 100000 UNIT/ML suspension Commonly known as:  MYCOSTATIN     TAKE these medications   chlorproMAZINE 10 MG tablet Commonly known as:  THORAZINE Take 1 tablet (10 mg total) by mouth 3 (three) times daily as needed for hiccoughs.   diphenoxylate-atropine 2.5-0.025 MG tablet Commonly known as:  LOMOTIL Take 2 tablets by mouth 4 (four) times daily as needed for diarrhea or loose stools.   feeding supplement (ENSURE ENLIVE) Liqd Take 237 mLs by mouth 3 (three) times daily.   ferrous sulfate 325 (65 FE) MG tablet Take 1 tablet (325 mg total) by mouth 2 (two) times daily with a meal.   gabapentin 300 MG capsule Commonly known as:  NEURONTIN Take 1 capsule (300 mg total) by mouth 3 (three) times daily.   hydrocortisone 2.5 % cream Apply topically 2 (two) times daily.   hyoscyamine 0.125 MG SL tablet Commonly  known as:  LEVSIN SL Place 1 tablet (0.125 mg total) under the tongue every 6 (six) hours as needed. What changed:  reasons to take this   magnesium oxide 400 (241.3 Mg) MG tablet Commonly known as:  MAG-OX Take 1 tablet (400 mg total) by mouth daily.   mirtazapine 15 MG tablet Commonly known as:  REMERON Take 1 tablet (15 mg total) by mouth at bedtime.   morphine 30 MG 12 hr tablet Commonly known as:  MS CONTIN Take 1 tablet (30 mg total) by mouth every 12 (twelve) hours.   ondansetron 8 MG tablet Commonly known as:  ZOFRAN Take 1 tablet (8 mg total) by mouth 2 (two) times daily as needed for refractory nausea / vomiting. Start on day 3 after chemotherapy.   Oxycodone HCl 10 MG Tabs Take 1 tablet (10 mg total) by mouth  every 6 (six) hours.   pantoprazole 40 MG tablet Commonly known as:  PROTONIX Take 1 tablet (40 mg total) by mouth daily.   potassium chloride SA 20 MEQ tablet Commonly known as:  K-DUR,KLOR-CON Take 1 tablet (20 mEq total) by mouth daily.   prochlorperazine 10 MG tablet Commonly known as:  COMPAZINE Take 1 tablet (10 mg total) by mouth every 6 (six) hours as needed (Nausea or vomiting).   rivaroxaban 20 MG Tabs tablet Commonly known as:  XARELTO Take 1 tablet (20 mg total) by mouth daily with supper.      No Known Allergies Follow-up Information    [name], MD Follow up.   Specialties:  Hematology, Oncology Why:  Follow-up as scheduled. Contact information: [place] [address] [phone]        PCP. Schedule an appointment as soon as possible for a visit in 2 week(s).   Why:  Follow-up in 2 to 3 weeks.       [name], MD Follow up.   Specialty:  Orthopedic Surgery Why:  Follow-up as scheduled. Contact information: [address] [place] [address] [phone]            The results of significant diagnostics from this hospitalization (including imaging, microbiology, ancillary and laboratory) are listed below for reference.    Significant Diagnostic Studies: Dg Chest 2 View  Result Date: 02/16/2019 CLINICAL DATA:  Colon cancer. Weakness. EXAM: CHEST - 2 VIEW COMPARISON:  CT chest 12/19/2018. Chest radiograph 01/08/2018. FINDINGS: Unchanged Port-A-Cath. Normal heart size. No consolidation, edema, or pulmonary nodules. No visible free air. Apparent severe malnutrition. No acute osseous findings. IMPRESSION: 1. No active cardiopulmonary disease. Electronically Signed   By: Staci Righter M.D.   On: 02/16/2019 10:15   Dg Wrist 2 Views Left  Result Date: 02/01/2019 CLINICAL DATA:  Status post reduction of left wrist fracture. EXAM: LEFT WRIST - 2 VIEW COMPARISON:  Radiographs of same day. FINDINGS: Status  post casting immobilization of left wrist fracture. Partial reduction of distal left radial fracture is noted with improved alignment of fracture components. The ulna is unremarkable IMPRESSION: Improved alignment of distal left radial fracture fragments status post reduction and casting. Electronically Signed   By: Marijo Conception, M.D.   On: 02/01/2019 14:01   Dg Wrist Complete Left  Result Date: 02/01/2019 CLINICAL DATA:  Pain following fall EXAM: LEFT WRIST - COMPLETE 3+ VIEW COMPARISON:  None. FINDINGS: Frontal, oblique, and lateral views obtained. There is a fracture of the distal radial metaphysis with lateral and dorsal displacement and dorsal angulation distally with approximately 1.5  cm of overriding of fracture fragments. No other fracture. No dislocation. No appreciable arthropathic change. IMPRESSION: Fracture distal radial metaphysis with angulation and displacement at the fracture site. Approximately 1.5 cm of overriding of fracture fragments noted. No dislocation. No evident arthropathy. Electronically Signed   By: Lowella Grip III M.D.   On: 02/01/2019 12:25   Ct Abdomen Pelvis W Contrast  Result Date: 02/15/2019 CLINICAL DATA:  Abdominal pain.  History of colon cancer. EXAM: CT ABDOMEN AND PELVIS WITH CONTRAST TECHNIQUE: Multidetector CT imaging of the abdomen and pelvis was performed using the standard protocol following bolus administration of intravenous contrast. CONTRAST:  176mL ISOVUE-300 IOPAMIDOL (ISOVUE-300) INJECTION 61% COMPARISON:  01/04/2019 CT abdomen pelvis FINDINGS: Lower chest: Lung bases are clear. Hepatobiliary: Normal patent contours. No biliary dilatation. The gallbladder is normal. No focal liver lesion. Pancreas: Hypoattenuating focus at the tail of the pancreas is unchanged, likely invading the pancreatic parenchyma. No dilatation of the pancreatic duct. Spleen: Low-density mass at the inferior splenic hilum is unchanged. Adrenals/Urinary Tract: The adrenal  glands are normal. Both kidneys are normal. Urinary bladder is distended. Stomach/Bowel: Right lower quadrant dual lumen colostomy without evidence of obstruction. Edematous appearance on the prior scan has improved. Soft tissue mass at the splenic flexure, invading the splenic hilum and pancreatic tail is unchanged, measuring approximately 5.1 cm. Small foci of adjacent gas may be extraluminal (coronal image 51, axial image 22). No dilated small bowel. Vascular/Lymphatic: There is aortic atherosclerotic calcification. Extensive anterior abdominal collaterals are again demonstrated, in the context of chronic occlusion of the splenic vein. There is a stent within the left iliac vein without opacification. Soft tissue density surrounding the superior mesenteric artery is unchanged. Reproductive: Prostate is unremarkable. Other: None Musculoskeletal: No acute or significant osseous findings. IMPRESSION: 1. Unchanged size of colonic mass at the splenic flexure with extraluminal extension and invasion of the splenic hilum and pancreatic tail. Multiple nearby punctate foci of gas may be extraluminal, which would suggest microperforation. No large volume pneumoperitoneum. 2. Right lower quadrant transverse colostomy with decreased edema of the efferent limb. 3. 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 4. Soft tissue mass along the anterior aspect of the proximal superior mesenteric artery, unchanged. Electronically Signed   By: Ulyses Jarred M.D.   On: 02/15/2019 06:07   Dg Hand Complete Left  Result Date: 02/01/2019 CLINICAL DATA:  Pain following fall EXAM: LEFT HAND - COMPLETE 3+ VIEW COMPARISON:  None. FINDINGS: Frontal, oblique, and lateral views obtained. There is a fracture of the distal radial metaphysis. There is dorsal and lateral displacement of the distal fracture fragment with approximately 1.5 cm proximal displacement of the  distal fracture fragment. No other fractures are appreciable. No dislocation. There is flexion of all MCP joints as well as the third, fourth, and fifth PIP and DIP joints. There is no appreciable joint space narrowing or erosion. IMPRESSION: Fracture of the distal radial metaphysis with dorsal and lateral displacement and dorsal angulation distally. Approximately 1.5 cm of overriding of fracture fragments noted. No other fractures are appreciable. No dislocation. No appreciable arthropathy. Note flexion of multiple distal joints. Electronically Signed   By: Lowella Grip III M.D.   On: 02/01/2019 12:24    Microbiology: Recent Results (from the past 240 hour(s))  Blood Culture (routine x 2)     Status: None (Preliminary result)   Collection Time: 02/15/19  4:30 AM  Result Value Ref Range Status  Specimen Description BLOOD PORTA CATH  Final   Special Requests   Final    BOTTLES DRAWN AEROBIC AND ANAEROBIC Blood Culture adequate volume Performed at [facility] [address] [place], [place] [address]    Culture NO GROWTH 4 DAYS  Final   Report Status PENDING  Incomplete  Blood Culture (routine x 2)     Status: None (Preliminary result)   Collection Time: 02/15/19  6:28 AM  Result Value Ref Range Status   Specimen Description BLOOD BLOOD RIGHT FOREARM  Final   Special Requests   Final    BOTTLES DRAWN AEROBIC AND ANAEROBIC Blood Culture adequate volume Performed at [facility] [address], [place], [place] [address]    Culture NO GROWTH 4 DAYS  Final   Report Status PENDING  Incomplete  Urine culture     Status: None   Collection Time: 02/15/19  6:28 AM  Result Value Ref Range Status   Specimen Description   Final    URINE, CLEAN CATCH Performed at [facility], [place] [address] [place], [place] [address]    Special Requests   Final    NONE Performed at [facility], [place] [address],  [place], [place] [address]    Culture   Final    NO GROWTH Performed at [facility], [place] [address] [place], [place] [address]    Report Status 02/16/2019 FINAL  Final     Labs: Basic Metabolic Panel: Recent Labs  Lab 02/16/19 0343 02/17/19 0400 02/18/19 0500 02/19/19 0546 02/20/19 0409  NA 136 133* 134* 134* 135  K 4.4 3.8 3.6 3.2* 4.0  CL 105 102 106 104 106  CO2 23 23 23 23 24   GLUCOSE 73 106* 100* 116* 100*  BUN 12 8 <5* <5* <5*  CREATININE 0.98 0.91 0.78 0.68 0.77  CALCIUM 8.2* 8.0* 8.2* 8.0* 8.2*  MG 1.6* 1.9 1.5* 1.9  --    Liver Function Tests: Recent Labs  Lab 02/15/19 0429 02/16/19 0343  AST 25 19  ALT 15 13  ALKPHOS 165* 130*  BILITOT 0.5 0.6  PROT 7.0 6.7  ALBUMIN 2.6* 2.4*   Recent Labs  Lab 02/15/19 0429  LIPASE 28   No results for input(s): AMMONIA in the last 168 hours. CBC: Recent Labs  Lab 02/16/19 0343 02/17/19 0400 02/18/19 0500 02/19/19 0546 02/20/19 0409  WBC 7.7 9.2 8.8 7.7 8.0  NEUTROABS  --   --  5.7  --   --   HGB 8.3* 8.5* 8.4* 8.2* 8.2*  HCT 28.1* 28.1* 28.2* 27.7* 27.5*  MCV 85.9 84.1 85.2 85.5 84.6  PLT 179 190 208 219 228   Cardiac Enzymes: No results for input(s): CKTOTAL, CKMB, CKMBINDEX, TROPONINI in the last 168 hours. BNP: BNP (last 3 results) No results for input(s): BNP in the last 8760 hours.  ProBNP (last 3 results) No results for input(s): PROBNP in the last 8760 hours.  CBG: No results for input(s): GLUCAP in the last 168 hours.     Signed:  Irine Seal MD.  [facility] 02/20/2019, 2:13 PM

## 2019-02-20 NOTE — Progress Notes (Signed)
Pt discharged home with daughter in stable condition. Discharge instructions and script given. Pt verbalized understanding. No immediate questions or concerns at this time. Patient discharged from unit via wheelchair.

## 2019-02-23 ENCOUNTER — Other Ambulatory Visit: Payer: Self-pay

## 2019-02-23 ENCOUNTER — Ambulatory Visit: Payer: Self-pay | Admitting: Hematology

## 2019-02-23 ENCOUNTER — Ambulatory Visit: Payer: Self-pay

## 2019-02-24 ENCOUNTER — Other Ambulatory Visit: Payer: Self-pay | Admitting: Hematology

## 2019-02-24 ENCOUNTER — Telehealth: Payer: Self-pay

## 2019-02-24 DIAGNOSIS — C189 Malignant neoplasm of colon, unspecified: Secondary | ICD-10-CM

## 2019-02-24 MED ORDER — OXYCODONE HCL 10 MG PO TABS: 10.0000 mg | ORAL_TABLET | ORAL | 0 refills | Status: DC | PRN

## 2019-02-24 MED FILL — OSCIMIN SL 0.125 MG TABLET: 0.125 | 7 days supply | Qty: 30 | Fill #2

## 2019-02-24 MED FILL — oxyCODONE HCL 10 MG TABS: 10 | 15 days supply | Qty: 90 | Fill #0

## 2019-02-24 MED FILL — DIPHENOXYLATE-ATROPINE 2.5-: 2.5-0.025 | 12 days supply | Qty: 90 | Fill #1

## 2019-02-24 MED FILL — GABAPENTIN 300 MG CAPSULE: 300 | 30 days supply | Qty: 90 | Fill #2

## 2019-02-24 NOTE — Telephone Encounter (Signed)
Spoke with his daughter regarding his cold, no fever, per [name] may use OT Mucinex, Tylenol, drink plenty of fluids.  She verbalized an understanding.
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
## 2019-03-02 ENCOUNTER — Telehealth: Payer: Self-pay

## 2019-03-02 ENCOUNTER — Inpatient Hospital Stay (HOSPITAL_COMMUNITY)
Admission: AD | Admit: 2019-03-02 | Discharge: 2019-03-06 | DRG: 640 | Disposition: A | Discharge status: Hospice | Payer: Medicaid Other | Source: Ambulatory Visit | Attending: Internal Medicine | Admitting: Internal Medicine

## 2019-03-02 ENCOUNTER — Inpatient Hospital Stay: Payer: Medicaid Other

## 2019-03-02 ENCOUNTER — Inpatient Hospital Stay (HOSPITAL_BASED_OUTPATIENT_CLINIC_OR_DEPARTMENT_OTHER): Payer: Medicaid Other | Admitting: Hematology

## 2019-03-02 ENCOUNTER — Encounter: Payer: Self-pay | Admitting: Hematology

## 2019-03-02 ENCOUNTER — Telehealth: Payer: Self-pay | Admitting: Hematology

## 2019-03-02 ENCOUNTER — Inpatient Hospital Stay: Payer: Medicaid Other | Attending: Hematology

## 2019-03-02 ENCOUNTER — Other Ambulatory Visit: Payer: Self-pay

## 2019-03-02 VITALS — BP 98/73 | HR 94 | Temp 99.6°F | Resp 16

## 2019-03-02 VITALS — BP 73/55 | HR 92 | Temp 98.2°F | Resp 16 | Ht 69.0 in | Wt 102.1 lb

## 2019-03-02 DIAGNOSIS — D630 Anemia in neoplastic disease: Secondary | ICD-10-CM

## 2019-03-02 DIAGNOSIS — D500 Iron deficiency anemia secondary to blood loss (chronic): Secondary | ICD-10-CM

## 2019-03-02 DIAGNOSIS — Z95828 Presence of other vascular implants and grafts: Secondary | ICD-10-CM

## 2019-03-02 DIAGNOSIS — R627 Adult failure to thrive: Secondary | ICD-10-CM | POA: Diagnosis present

## 2019-03-02 DIAGNOSIS — Z7901 Long term (current) use of anticoagulants: Secondary | ICD-10-CM | POA: Insufficient documentation

## 2019-03-02 DIAGNOSIS — Z7189 Other specified counseling: Secondary | ICD-10-CM | POA: Diagnosis not present

## 2019-03-02 DIAGNOSIS — Z66 Do not resuscitate: Secondary | ICD-10-CM | POA: Diagnosis present

## 2019-03-02 DIAGNOSIS — N179 Acute kidney failure, unspecified: Secondary | ICD-10-CM | POA: Diagnosis present

## 2019-03-02 DIAGNOSIS — S62102A Fracture of unspecified carpal bone, left wrist, initial encounter for closed fracture: Secondary | ICD-10-CM

## 2019-03-02 DIAGNOSIS — E861 Hypovolemia: Principal | ICD-10-CM | POA: Diagnosis present

## 2019-03-02 DIAGNOSIS — S62102D Fracture of unspecified carpal bone, left wrist, subsequent encounter for fracture with routine healing: Secondary | ICD-10-CM | POA: Diagnosis not present

## 2019-03-02 DIAGNOSIS — C786 Secondary malignant neoplasm of retroperitoneum and peritoneum: Secondary | ICD-10-CM | POA: Insufficient documentation

## 2019-03-02 DIAGNOSIS — C185 Malignant neoplasm of splenic flexure: Secondary | ICD-10-CM | POA: Diagnosis not present

## 2019-03-02 DIAGNOSIS — C189 Malignant neoplasm of colon, unspecified: Secondary | ICD-10-CM

## 2019-03-02 DIAGNOSIS — X500XXD Overexertion from strenuous movement or load, subsequent encounter: Secondary | ICD-10-CM

## 2019-03-02 DIAGNOSIS — T451X5A Adverse effect of antineoplastic and immunosuppressive drugs, initial encounter: Secondary | ICD-10-CM | POA: Diagnosis present

## 2019-03-02 DIAGNOSIS — Z681 Body mass index (BMI) 19 or less, adult: Secondary | ICD-10-CM

## 2019-03-02 DIAGNOSIS — Z79891 Long term (current) use of opiate analgesic: Secondary | ICD-10-CM | POA: Diagnosis not present

## 2019-03-02 DIAGNOSIS — Z9221 Personal history of antineoplastic chemotherapy: Secondary | ICD-10-CM

## 2019-03-02 DIAGNOSIS — Z86718 Personal history of other venous thrombosis and embolism: Secondary | ICD-10-CM | POA: Diagnosis not present

## 2019-03-02 DIAGNOSIS — Z86711 Personal history of pulmonary embolism: Secondary | ICD-10-CM | POA: Diagnosis not present

## 2019-03-02 DIAGNOSIS — R5381 Other malaise: Secondary | ICD-10-CM | POA: Diagnosis present

## 2019-03-02 DIAGNOSIS — I959 Hypotension, unspecified: Secondary | ICD-10-CM

## 2019-03-02 DIAGNOSIS — D509 Iron deficiency anemia, unspecified: Secondary | ICD-10-CM

## 2019-03-02 DIAGNOSIS — I9589 Other hypotension: Secondary | ICD-10-CM | POA: Diagnosis not present

## 2019-03-02 DIAGNOSIS — R64 Cachexia: Secondary | ICD-10-CM | POA: Diagnosis present

## 2019-03-02 DIAGNOSIS — Z79899 Other long term (current) drug therapy: Secondary | ICD-10-CM | POA: Insufficient documentation

## 2019-03-02 DIAGNOSIS — R630 Anorexia: Secondary | ICD-10-CM

## 2019-03-02 DIAGNOSIS — E43 Unspecified severe protein-calorie malnutrition: Secondary | ICD-10-CM | POA: Diagnosis present

## 2019-03-02 DIAGNOSIS — X58XXXD Exposure to other specified factors, subsequent encounter: Secondary | ICD-10-CM | POA: Diagnosis present

## 2019-03-02 DIAGNOSIS — R197 Diarrhea, unspecified: Secondary | ICD-10-CM | POA: Diagnosis present

## 2019-03-02 DIAGNOSIS — R531 Weakness: Secondary | ICD-10-CM

## 2019-03-02 DIAGNOSIS — C799 Secondary malignant neoplasm of unspecified site: Secondary | ICD-10-CM | POA: Diagnosis present

## 2019-03-02 DIAGNOSIS — F329 Major depressive disorder, single episode, unspecified: Secondary | ICD-10-CM | POA: Diagnosis present

## 2019-03-02 DIAGNOSIS — Z933 Colostomy status: Secondary | ICD-10-CM

## 2019-03-02 DIAGNOSIS — Z515 Encounter for palliative care: Secondary | ICD-10-CM | POA: Diagnosis present

## 2019-03-02 DIAGNOSIS — D6481 Anemia due to antineoplastic chemotherapy: Secondary | ICD-10-CM | POA: Diagnosis present

## 2019-03-02 DIAGNOSIS — C9002 Multiple myeloma in relapse: Secondary | ICD-10-CM

## 2019-03-02 DIAGNOSIS — G893 Neoplasm related pain (acute) (chronic): Secondary | ICD-10-CM | POA: Diagnosis present

## 2019-03-02 DIAGNOSIS — E860 Dehydration: Secondary | ICD-10-CM | POA: Diagnosis present

## 2019-03-02 LAB — COMPREHENSIVE METABOLIC PANEL
ALT: 13 U/L (ref 0–44)
ANION GAP: 11 (ref 5–15)
AST: 32 U/L (ref 15–41)
Albumin: 3.1 g/dL — ABNORMAL LOW (ref 3.5–5.0)
Alkaline Phosphatase: 64 U/L (ref 38–126)
BUN: 33 mg/dL — ABNORMAL HIGH (ref 6–20)
CO2: 23 mmol/L (ref 22–32)
Calcium: 8.9 mg/dL (ref 8.9–10.3)
Chloride: 98 mmol/L (ref 98–111)
Creatinine, Ser: 1.22 mg/dL (ref 0.61–1.24)
GFR calc Af Amer: >60 mL/min (ref >60)
GFR calc non Af Amer: >60 mL/min (ref >60)
Glucose, Bld: 142 mg/dL — ABNORMAL HIGH (ref 70–99)
Potassium: 3.7 mmol/L (ref 3.5–5.1)
Sodium: 132 mmol/L — ABNORMAL LOW (ref 135–145)
Total Bilirubin: 0.6 mg/dL (ref 0.3–1.2)
Total Protein: 9.1 g/dL — ABNORMAL HIGH (ref 6.5–8.1)

## 2019-03-02 LAB — CBC WITH DIFFERENTIAL (CANCER CENTER ONLY)
Abs Immature Granulocytes: 0.09 10*3/uL — ABNORMAL HIGH (ref 0.00–0.07)
BASOS PCT: 0 %
Basophils Absolute: 0 10*3/uL (ref 0.0–0.1)
EOS ABS: 0 10*3/uL (ref 0.0–0.5)
Eosinophils Relative: 0 %
HCT: 31 % — ABNORMAL LOW (ref 39.0–52.0)
Hemoglobin: 9.4 g/dL — ABNORMAL LOW (ref 13.0–17.0)
Immature Granulocytes: 1 %
Lymphocytes Relative: 15 %
Lymphs Abs: 1.8 10*3/uL (ref 0.7–4.0)
MCH: 25 pg — ABNORMAL LOW (ref 26.0–34.0)
MCHC: 30.3 g/dL (ref 30.0–36.0)
MCV: 82.4 fL (ref 80.0–100.0)
Monocytes Absolute: 0.9 10*3/uL (ref 0.1–1.0)
Monocytes Relative: 8 %
Neutro Abs: 8.6 10*3/uL — ABNORMAL HIGH (ref 1.7–7.7)
Neutrophils Relative %: 76 %
PLATELETS: 138 10*3/uL — AB (ref 150–400)
RBC: 3.76 MIL/uL — ABNORMAL LOW (ref 4.22–5.81)
RDW: 19.7 % — ABNORMAL HIGH (ref 11.5–15.5)
WBC Count: 11.4 10*3/uL — ABNORMAL HIGH (ref 4.0–10.5)
nRBC: 0 % (ref 0.0–0.2)

## 2019-03-02 LAB — MRSA PCR SCREENING: MRSA by PCR: NEGATIVE

## 2019-03-02 MED ORDER — PANTOPRAZOLE SODIUM 40 MG PO TBEC
40.0000 mg | DELAYED_RELEASE_TABLET | Freq: Every day | ORAL | Status: DC
  Administered 2019-03-02 – 2019-03-06 (×4): 40 mg via ORAL
  Filled 2019-03-02 (×6): qty 1

## 2019-03-02 MED ORDER — SODIUM CHLORIDE 0.9 % IV SOLN
INTRAVENOUS | Status: DC
  Administered 2019-03-02 – 2019-03-06 (×7): via INTRAVENOUS

## 2019-03-02 MED ORDER — PROCHLORPERAZINE MALEATE 10 MG PO TABS: 10.0000 mg | ORAL_TABLET | Freq: Four times a day (QID) | ORAL | Status: DC | PRN

## 2019-03-02 MED ORDER — GABAPENTIN 300 MG PO CAPS
300.0000 mg | ORAL_CAPSULE | Freq: Three times a day (TID) | ORAL | Status: DC
  Administered 2019-03-02 – 2019-03-06 (×11): 300 mg via ORAL
  Filled 2019-03-02 (×11): qty 1

## 2019-03-02 MED ORDER — RIVAROXABAN 20 MG PO TABS
20.0000 mg | ORAL_TABLET | Freq: Every day | ORAL | Status: DC
  Administered 2019-03-03 – 2019-03-06 (×4): 20 mg via ORAL
  Filled 2019-03-02 (×4): qty 1

## 2019-03-02 MED ORDER — MIRTAZAPINE 15 MG PO TABS
15.0000 mg | ORAL_TABLET | Freq: Every day | ORAL | Status: DC
  Administered 2019-03-02 – 2019-03-05 (×4): 15 mg via ORAL
  Filled 2019-03-02 (×4): qty 1

## 2019-03-02 MED ORDER — SODIUM CHLORIDE 0.9% FLUSH
10.0000 mL | INTRAVENOUS | Status: DC | PRN
  Administered 2019-03-02: 10 mL via INTRAVENOUS
  Filled 2019-03-02: qty 10

## 2019-03-02 MED ORDER — POTASSIUM CHLORIDE CRYS ER 10 MEQ PO TBCR
20.0000 meq | EXTENDED_RELEASE_TABLET | Freq: Every day | ORAL | Status: DC
  Administered 2019-03-02 – 2019-03-06 (×4): 20 meq via ORAL
  Filled 2019-03-02: qty 1
  Filled 2019-03-02 (×4): qty 2

## 2019-03-02 MED ORDER — MORPHINE SULFATE ER 30 MG PO TBCR
30.0000 mg | EXTENDED_RELEASE_TABLET | Freq: Two times a day (BID) | ORAL | Status: DC
  Administered 2019-03-02 – 2019-03-06 (×8): 30 mg via ORAL
  Filled 2019-03-02 (×8): qty 1

## 2019-03-02 MED ORDER — OXYCODONE HCL 5 MG PO TABS
10.0000 mg | ORAL_TABLET | ORAL | Status: DC | PRN
  Administered 2019-03-05: 10 mg via ORAL
  Filled 2019-03-02: qty 2

## 2019-03-02 MED ORDER — MAGNESIUM OXIDE 400 (241.3 MG) MG PO TABS
400.0000 mg | ORAL_TABLET | Freq: Every day | ORAL | Status: DC
  Administered 2019-03-02 – 2019-03-06 (×4): 400 mg via ORAL
  Filled 2019-03-02 (×6): qty 1

## 2019-03-02 MED ORDER — FERROUS SULFATE 325 (65 FE) MG PO TABS
325.0000 mg | ORAL_TABLET | Freq: Two times a day (BID) | ORAL | Status: DC
  Administered 2019-03-02 – 2019-03-06 (×7): 325 mg via ORAL
  Filled 2019-03-02 (×8): qty 1

## 2019-03-02 MED ORDER — SODIUM CHLORIDE 0.9 % IV SOLN
INTRAVENOUS | Status: DC
  Administered 2019-03-02: 13:00:00 via INTRAVENOUS
  Filled 2019-03-02 (×2): qty 250

## 2019-03-02 MED ORDER — SODIUM CHLORIDE 0.9 % IV SOLN
INTRAVENOUS | Status: AC
  Administered 2019-03-02: 11:00:00 via INTRAVENOUS
  Filled 2019-03-02 (×2): qty 250

## 2019-03-02 MED ORDER — DIPHENOXYLATE-ATROPINE 2.5-0.025 MG PO TABS: 2.0000 | ORAL_TABLET | Freq: Four times a day (QID) | ORAL | Status: DC | PRN

## 2019-03-02 MED ORDER — SODIUM CHLORIDE 0.9% FLUSH
10.0000 mL | INTRAVENOUS | Status: DC | PRN
  Administered 2019-03-05: 10 mL
  Filled 2019-03-02: qty 40

## 2019-03-02 MED ORDER — CHLORHEXIDINE GLUCONATE CLOTH 2 % EX PADS
6.0000 | MEDICATED_PAD | Freq: Every day | CUTANEOUS | Status: DC
  Administered 2019-03-02 – 2019-03-05 (×4): 6 via TOPICAL

## 2019-03-02 MED ORDER — MEGESTROL ACETATE 400 MG/10ML PO SUSP
400.0000 mg | Freq: Every day | ORAL | Status: DC
  Administered 2019-03-03 – 2019-03-06 (×3): 400 mg via ORAL
  Filled 2019-03-02 (×5): qty 10
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
## 2019-03-02 NOTE — Telephone Encounter (Signed)
No los per 3/5. 

## 2019-03-02 NOTE — Patient Instructions (Signed)

## 2019-03-02 NOTE — Progress Notes (Signed)
Manufacturing engineer [facility])  Hospice  Received request for home hospice services after discharge from [name].  Attempted to contact daughter without success and unable to leave VM.  ACC will visit with family 3/6 am and determine discharge plan.  We have tentatively scheduled a RN home visit for 3/6 between 5-6 pm, should the pt be ready to discharge back home tomorrow.  If discharge will not be until later, we will follow and anticipate those needs.  Thank you, Venia Carbon BSN, RN  [place] (# in [place] under [facility]) [phone] main #

## 2019-03-02 NOTE — Telephone Encounter (Signed)
Faxed referral to Hospice with daughter's contact information to [place]. Included office note from today.

## 2019-03-02 NOTE — Progress Notes (Signed)
Pt BP 91/73 post 2 hour IVF.  Per [name], administer another 500 mLs of NS over one hour and re assess pt.   Report called to [place], RN on [place] at [place] [facility].  Pt transferred to floor with IV saline locked  Via wheelchair.  All belongings transferred with pt.   While transferring pt to [address] [place], this RN received a call from the nurse on [address] that the patient would need to go to the Step Down Unit on the second floor.  Pt transferred to stepdown unit room 1234 and report given to [facility].

## 2019-03-03 ENCOUNTER — Encounter (HOSPITAL_COMMUNITY): Payer: Self-pay

## 2019-03-03 ENCOUNTER — Other Ambulatory Visit: Payer: Self-pay | Admitting: *Deleted

## 2019-03-03 DIAGNOSIS — Z66 Do not resuscitate: Secondary | ICD-10-CM

## 2019-03-03 DIAGNOSIS — E860 Dehydration: Secondary | ICD-10-CM

## 2019-03-03 DIAGNOSIS — Z515 Encounter for palliative care: Secondary | ICD-10-CM

## 2019-03-03 DIAGNOSIS — Z7189 Other specified counseling: Secondary | ICD-10-CM

## 2019-03-03 DIAGNOSIS — D6481 Anemia due to antineoplastic chemotherapy: Secondary | ICD-10-CM

## 2019-03-03 DIAGNOSIS — I959 Hypotension, unspecified: Secondary | ICD-10-CM

## 2019-03-03 DIAGNOSIS — C799 Secondary malignant neoplasm of unspecified site: Secondary | ICD-10-CM

## 2019-03-03 DIAGNOSIS — C189 Malignant neoplasm of colon, unspecified: Secondary | ICD-10-CM

## 2019-03-03 LAB — BASIC METABOLIC PANEL
Anion gap: 5 (ref 5–15)
BUN: 23 mg/dL — ABNORMAL HIGH (ref 6–20)
CO2: 23 mmol/L (ref 22–32)
Calcium: 8.3 mg/dL — ABNORMAL LOW (ref 8.9–10.3)
Chloride: 109 mmol/L (ref 98–111)
Creatinine, Ser: 0.99 mg/dL (ref 0.61–1.24)
GFR calc Af Amer: >60 mL/min (ref >60)
Glucose, Bld: 100 mg/dL — ABNORMAL HIGH (ref 70–99)
Potassium: 3.7 mmol/L (ref 3.5–5.1)
Sodium: 137 mmol/L (ref 135–145)

## 2019-03-03 LAB — CBC
HCT: 25 % — ABNORMAL LOW (ref 39.0–52.0)
Hemoglobin: 7.3 g/dL — ABNORMAL LOW (ref 13.0–17.0)
MCH: 25.3 pg — ABNORMAL LOW (ref 26.0–34.0)
MCHC: 29.2 g/dL — ABNORMAL LOW (ref 30.0–36.0)
MCV: 86.8 fL (ref 80.0–100.0)
Platelets: 106 10*3/uL — ABNORMAL LOW (ref 150–400)
RBC: 2.88 MIL/uL — ABNORMAL LOW (ref 4.22–5.81)
RDW: 19.9 % — ABNORMAL HIGH (ref 11.5–15.5)
WBC: 7 10*3/uL (ref 4.0–10.5)
nRBC: 0 % (ref 0.0–0.2)

## 2019-03-03 MED ORDER — ADULT MULTIVITAMIN W/MINERALS CH
1.0000 | ORAL_TABLET | Freq: Every day | ORAL | Status: DC
  Administered 2019-03-05 – 2019-03-06 (×2): 1 via ORAL
  Filled 2019-03-03 (×3): qty 1

## 2019-03-03 NOTE — Care Management Note (Signed)
Case Management Note  Patient Details  Name: Jeff [name] MRN: [id number] Date of Birth: 1961-12-19  Subjective/Objective:                    Action/Plan:spoke with pt's daughter [name] [phone], Daughter selected [facility]. Referral called for Home with Hospice.    Expected Discharge Date:  03/05/19               Expected Discharge Plan:  Home w Hospice Care  In-House Referral:  Clinical Social Work  Discharge planning Services  CM Consult  Post Acute Care Choice:    Choice offered to:  Adult Children  DME Arranged:    DME Agency:     HH Arranged:  RN [place] Agency:  [facility]  Status of Service:  In process, will continue to follow  If discussed at Long Length of Stay Meetings, dates discussed:    Additional CommentsPurcell Mouton, RN 03/03/2019, 2:57 PM

## 2019-03-03 NOTE — Consult Note (Signed)
Consultation Note Date: 03/03/2019   Patient Name: Jeff [name]  [name]: 1961-01-28  MRN: [id number]  Age / Sex: 58 y.o., male  PCP: Patient, [name] Referring Physician: Shelly Coss, MD  Reason for Consultation: Establishing goals of care  HPI/Patient Profile: 58 y.o. male     admitted on 03/02/2019     Clinical Assessment and Goals of Care:  58 yo gentleman who lives alone in [place], [place]. Patient's daughter Jeff [name] lives nearby. The patient has a life limiting illness of metastatic colon cancer diagnosed in January 2018.  He sees Dr Burr Medico from med onc. He also has a history of pulmonary embolism, right lower extremity DVT on anticoagulation and IVC filter placement, recent left wrist fracture when he was trying to help his friend move a refrigerator, he was sent from cancer center by his oncologist for direct admission for management of generalized weakness and poor appetite.   After further discussions with his oncologist at the cancer center, a code status of DNR was established, additionally,no further chemotherapy was also discussed.   He is currently admitted to hospital medicine service step down unit at [facility]. Patient and his family are interested in exploring hospice services.   A palliative consult has hence been requested.   The patient has a flat affect, he avoids eye contact, his daughter is in the room. A new cast has been applied to his wrist.   I introduced myself and palliative care as follows: Palliative medicine is specialized medical care for people living with serious illness. It focuses on providing relief from the symptoms and stress of a serious illness. The goal is to improve quality of life for both the patient and the family.  We discussed in detail about hospice philosophy of care. Home with  Hospice option was discussed in detail. The patient doesn't want  SNF, doesn't want residential hospice either.   We discussed about the patient being at home in his familiar surroundings, his daughter lives close by as well. Discussed guarded prognosis with his malignancy, focused on making sure the patient was as well as he can for as long as he can.   All of the patient's and daughter's questions addressed to the best of my ability, see below.    NEXT OF KIN  daughter Jeff [name] is primary caregiver. Patient has 3 kids.   SUMMARY OF RECOMMENDATIONS    DNR DNI Home with hospice. Patient needs medical equipment such as hospital bed, bedside commode, O2, suction etc.  Continue current mode of care  Code Status/Advance Care Planning:  DNR    Symptom Management:    as above   Palliative Prophylaxis:   Delirium Protocol  Psycho-social/Spiritual:   Desire for further Chaplaincy support:yes  Additional Recommendations: Education on Hospice  Prognosis:   < 3 months  Discharge Planning: Home with Hospice      Primary Diagnoses: Present on Admission: . Primary colon cancer with metastasis to other site [facility]) . Protein-calorie malnutrition, severe . Anemia in neoplastic disease  I have reviewed the medical record, interviewed the patient and family, and examined the patient. The following aspects are pertinent.  Past Medical History:  Diagnosis Date  . Acute deep vein thrombosis (DVT) of right lower extremity ([place]) 02/18/2017  . Colon cancer ([place])   . Microcytic anemia    /notes 01/11/2017   Social History   Socioeconomic History  . Marital status: Single    Spouse name: Not on file  . Number of children: Not on file  . Years of education: Not on file  . Highest education level: Not on file  Occupational History  . Not on file  Social Needs  . Financial resource strain: Not on file  . Food insecurity:    Worry: Not on file    Inability: Not on file  . Transportation needs:    Medical: Not on file    Non-medical: Not on  file  Tobacco Use  . Smoking status: Never Smoker  . Smokeless tobacco: Never Used  Substance and Sexual Activity  . Alcohol use: Yes    Alcohol/week: 6.0 standard drinks    Types: 6 Cans of beer per week    Comment: nothing since the 14th of january  . Drug use: No  . Sexual activity: Not Currently  Lifestyle  . Physical activity:    Days per week: Not on file    Minutes per session: Not on file  . Stress: Not on file  Relationships  . Social connections:    Talks on phone: Not on file    Gets together: Not on file    Attends religious service: Not on file    Active member of club or organization: Not on file    Attends meetings of clubs or organizations: Not on file    Relationship status: Not on file  Other Topics Concern  . Not on file  Social History Narrative   Single, lives alone   Daughter,Jeff [name] is primary caregiver   Family History  Problem Relation Age of Onset  . Cancer Mother    Scheduled Meds: . Chlorhexidine Gluconate Cloth  6 each Topical Daily  . ferrous sulfate  325 mg Oral BID WC  . gabapentin  300 mg Oral TID  . magnesium oxide  400 mg Oral Daily  . megestrol  400 mg Oral Daily  . mirtazapine  15 mg Oral QHS  . morphine  30 mg Oral Q12H  . multivitamin with minerals  1 tablet Oral Daily  . pantoprazole  40 mg Oral Daily  . potassium chloride  20 mEq Oral Daily  . rivaroxaban  20 mg Oral Q breakfast   Continuous Infusions: . sodium chloride 125 mL/hr at 03/03/19 0900   PRN Meds:.diphenoxylate-atropine, oxyCODONE, prochlorperazine, sodium chloride flush Medications Prior to Admission:  Prior to Admission medications   Medication Sig Start Date End Date Taking? Authorizing Provider  diphenoxylate-atropine (LOMOTIL) 2.5-0.025 MG tablet Take 2 tablets by mouth 4 (four) times daily as needed for diarrhea or loose stools. 02/02/19  Yes [name], MD  feeding supplement, ENSURE ENLIVE, (ENSURE ENLIVE) LIQD Take 237 mLs by mouth 3 (three) times  daily. 01/11/18  Yes Irene Pap N, DO  ferrous sulfate 325 (65 FE) MG tablet Take 1 tablet (325 mg total) by mouth 2 (two) times daily with a meal. 03/29/17  Yes Curcio, Roselie Awkward, NP  gabapentin (NEURONTIN) 300 MG capsule Take 1 capsule (300 mg total) by mouth 3 (three) times daily. 01/26/19  Yes Ned Card  K, NP  hyoscyamine (LEVSIN SL) 0.125 MG SL tablet Place 1 tablet (0.125 mg total) under the tongue every 6 (six) hours as needed. Patient taking differently: Place 0.125 mg under the tongue every 6 (six) hours as needed for cramping.  01/02/19  Yes Tanner, Lyndon Code., PA-C  mirtazapine (REMERON) 15 MG tablet Take 1 tablet (15 mg total) by mouth at bedtime. 06/20/18  Yes [name], MD  morphine (MS CONTIN) 30 MG 12 hr tablet Take 1 tablet (30 mg total) by mouth every 12 (twelve) hours. 02/02/19  Yes [name], MD  ondansetron (ZOFRAN) 8 MG tablet Take 1 tablet (8 mg total) by mouth 2 (two) times daily as needed for refractory nausea / vomiting. Start on day 3 after chemotherapy. 08/09/18  Yes [name], MD  Oxycodone HCl 10 MG TABS Take 1 tablet (10 mg total) by mouth every 6 (six) hours. 02/02/19  Yes [name], MD  pantoprazole (PROTONIX) 40 MG tablet Take 1 tablet (40 mg total) by mouth daily. 12/22/18  Yes [name], MD  potassium chloride SA (K-DUR,KLOR-CON) 20 MEQ tablet Take 1 tablet (20 mEq total) by mouth daily. 01/02/19  Yes Tanner, Lyndon Code., PA-C  prochlorperazine (COMPAZINE) 10 MG tablet Take 1 tablet (10 mg total) by mouth every 6 (six) hours as needed (Nausea or vomiting). 04/18/18  Yes Alla Feeling, NP  rivaroxaban (XARELTO) 20 MG TABS tablet Take 1 tablet (20 mg total) by mouth daily with supper. 02/20/19  Yes Eugenie Filler, MD  chlorproMAZINE (THORAZINE) 10 MG tablet Take 1 tablet (10 mg total) by mouth 3 (three) times daily as needed for hiccoughs. Patient not taking: Reported on 03/02/2019 11/09/18   [name], MD  hydrocortisone 2.5 % cream Apply topically 2 (two) times daily. Patient not  taking: Reported on 03/02/2019 10/18/17   Alla Feeling, NP  magnesium oxide (MAG-OX) 400 (241.3 Mg) MG tablet Take 1 tablet (400 mg total) by mouth daily. Patient not taking: Reported on 03/02/2019 06/08/18   Alla Feeling, NP  Oxycodone HCl 10 MG TABS Take 1 tablet (10 mg total) by mouth every 4 (four) hours as needed. Patient not taking: Reported on 03/02/2019 02/24/19   [name], MD   No Known Allergies Review of Systems +pain in back  Physical Exam Frail cachectic appearing Flat affect Regular S1 S 2 abd mild generalized discomfort No edema Muscle wasting Has LUE in cast due to recent wrist fracture.   Vital Signs: BP 94/71   Pulse 96   Temp 99.3 F (37.4 C) (Oral)   Resp 18   Ht 5\' 9"  (1.753 m)   Wt 45.4 kg   SpO2 94%   BMI 14.78 kg/m  Pain Scale: 0-10   Pain Score: 0-No pain   SpO2: SpO2: 94 % O2 Device:SpO2: 94 % O2 Flow Rate: .   IO: Intake/output summary:   Intake/Output Summary (Last 24 hours) at 03/03/2019 1147 Last data filed at 03/03/2019 0900 Gross per 24 hour  Intake 2142.32 ml  Output 350 ml  Net 1792.32 ml    LBM: Last BM Date: 03/03/19 Baseline Weight: Weight: 45.4 kg Most recent weight: Weight: 45.4 kg     Palliative Assessment/Data:   Flowsheet Rows     Most Recent Value  Intake Tab  Referral Department  Hospitalist  Unit at Time of Referral  ICU  Palliative Care Primary Diagnosis  Cancer  Date Notified  03/02/19  Palliative Care Type  Return patient Palliative Care  Reason for referral  Counsel Regarding Hospice  Date of Admission  03/02/19  # of days IP prior to Palliative referral  0  Clinical Assessment  Psychosocial & Spiritual Assessment  Palliative Care Outcomes    PPS 40%  Time In:  10 Time Out:  11.10 Time Total:  70  Greater than 50%  of this time was spent counseling and coordinating care related to the above assessment and plan.  Signed by: Loistine Chance, MD [phone]  Please contact Palliative Medicine Team phone  at [phone] for questions and concerns.  For individual provider: See Shea Evans

## 2019-03-03 NOTE — Progress Notes (Signed)
PROGRESS NOTE    Jeff [name]  HQI:[id number] [name]: Jul 14, 1961 [name]: 03/02/2019 PCP: Patient, [name]   Brief Narrative: Jeff [name] is a 58 y.o. male with medical history significant of metastatic colon cancer, pulmonary embolism, right lower extremity DVT on anticoagulation and IVC filter placement, recent left wrist fracture who was sent from cancer center by his oncologist for direct admission for management of generalized weakness and poor appetite.  Most of the information was taken from the daughter at the bedside.  He was taken to the cancer center for his regular appointment for chemotherapy.  He was found to be very weak.  Daughter says that he has been on anything for last 1 week.  His systolic blood pressure was found to be in the range of 80s in the cancer center.  Patient was hardly able to stand on his feet.  His oncologist, Dr. [name], discussed with the patient and family and made her DNR.  No plan for further chemotherapy.  He was given IV fluids at the cancer center.  Family also interested on discussion with palliative care for possible residential hospice versus hospice at home.  Patient was admitted to stepdown unit for close monitoring of the blood pressure , IV fluids. Palliative care consulted.  Assessment & Plan:   Principal Problem:   Generalized weakness Active Problems:   Protein-calorie malnutrition, severe   Primary colon cancer with metastasis to other site [facility])   History of deep vein thrombosis (DVT) of lower extremity   Anemia in neoplastic disease   Loss of appetite   Hypotension   Left wrist fracture   Weakness   Generalized weakness: Secondary to his progressive metastatic disease, poor oral intake, hypotension.  Likely chemotherapy-induced too. We will continue IV fluids.  Poor appetite: Has not eaten anything since last 1 week.  Will start on Megace.  Dietitian consulted.  Continue mirtazapine.  Hypotension: Continue IV fluids.  Blood  pressure in the range of 90s/60s this morning.  Metastatic colon cancer: Diagnosed in January 2018, follows with oncology.  On his last admission there was concern for bowel perforation.  Imagings did not suggest so and he was discharged home.  Was on chemotherapy.  On FOLFIRI and Avastin. has colostomy also. There is no plan for further chemotherapy as per his oncologist.  Will consult palliative care.  Patient is a DNR now. Awaiting palliative care evaluation today.  Patient's daughter interested on hospice at home.Hospice is already following.  History of left/right lower extremity DVT/PE: On Xarelto status post thrombectomy.  Status post IVC filter.  Severe protein calorie malnutrition: Secondary to malignancy.  Dietitian consulted.  Anemia associated with malignancy: Currently hemoglobin stable.  On iron supplementation. Hb this morning is 7.3.  Will transfuse if hemoglobin drops below 7.  Cancer associated abdominal pain: Continue pain management.  On MS Contin and oxycodone.  Left wrist fracture: Splint/cast on the left upper extremity .Follows with orthopedics.  Diarrhea: Chemo related.  Continue Imodium as needed.          DVT prophylaxis: Xarelto Code Status: Full Family Communication: Son and daughter at the bedside Disposition Plan: Hospice at home after palliative care evaluation   Consultants: Palliative care  Procedures: None  Antimicrobials:  Anti-infectives (From admission, onward)   None      Subjective: Patient seen and examined the bedside this morning.  Blood pressure soft.  Looks more comfortable today.  More alert and communicative.  Not in any kind of distress.  Objective: Vitals:   03/03/19 0622 03/03/19 0700 03/03/19 0800 03/03/19 0900  BP: 93/62 94/63 99/65  94/71  Pulse:      Resp: 16 16 17 18   Temp:      TempSrc:      SpO2:      Weight:      Height:        Intake/Output Summary (Last 24 hours) at 03/03/2019 1055 Last data  filed at 03/03/2019 0900 Gross per 24 hour  Intake 2142.32 ml  Output 350 ml  Net 1792.32 ml   Filed Weights   03/02/19 1554  Weight: 45.4 kg    Examination:  General exam: Frail, cachectic, generalized weakness  HEENT:PERRL,Oral mucosa moist, Ear/Nose normal on gross exam Respiratory system: Bilateral equal air entry, normal vesicular breath sounds, no wheezes or crackles  Cardiovascular system: S1 & S2 heard, RRR. No JVD, murmurs, rubs, gallops or clicks. No pedal edema.  Chemo-Port on the right chest Gastrointestinal system: Abdomen is nondistended, soft and nontender. No organomegaly or masses felt. Normal bowel sounds heard.Colostomy Central nervous system: Alert and oriented. No focal neurological deficits. Extremities: No edema, no clubbing ,no cyanosis, distal peripheral pulses palpable. Skin: No rashes, lesions or ulcers,no icterus ,no pallor MSK: Muscle wasting  Data Reviewed: I have personally reviewed following labs and imaging studies  CBC: Recent Labs  Lab 03/02/19 0928 03/03/19 0601  WBC 11.4* 7.0  NEUTROABS 8.6*  --   HGB 9.4* 7.3*  HCT 31.0* 25.0*  MCV 82.4 86.8  PLT 138* 397*   Basic Metabolic Panel: Recent Labs  Lab 03/02/19 0929 03/03/19 0601  NA 132* 137  K 3.7 3.7  CL 98 109  CO2 23 23  GLUCOSE 142* 100*  BUN 33* 23*  CREATININE 1.22 0.99  CALCIUM 8.9 8.3*   GFR: Estimated Creatinine Clearance: 51.6 mL/min (by C-G formula based on SCr of 0.99 mg/dL). Liver Function Tests: Recent Labs  Lab 03/02/19 0929  AST 32  ALT 13  ALKPHOS 64  BILITOT 0.6  PROT 9.1*  ALBUMIN 3.1*   No results for input(s): LIPASE, AMYLASE in the last 168 hours. No results for input(s): AMMONIA in the last 168 hours. Coagulation Profile: No results for input(s): INR, PROTIME in the last 168 hours. Cardiac Enzymes: No results for input(s): CKTOTAL, CKMB, CKMBINDEX, TROPONINI in the last 168 hours. BNP (last 3 results) No results for input(s): PROBNP in the  last 8760 hours. HbA1C: No results for input(s): HGBA1C in the last 72 hours. CBG: No results for input(s): GLUCAP in the last 168 hours. Lipid Profile: No results for input(s): CHOL, HDL, LDLCALC, TRIG, CHOLHDL, LDLDIRECT in the last 72 hours. Thyroid Function Tests: No results for input(s): TSH, T4TOTAL, FREET4, T3FREE, THYROIDAB in the last 72 hours. Anemia Panel: No results for input(s): VITAMINB12, FOLATE, FERRITIN, TIBC, IRON, RETICCTPCT in the last 72 hours. Sepsis Labs: No results for input(s): PROCALCITON, LATICACIDVEN in the last 168 hours.  Recent Results (from the past 240 hour(s))  Culture, blood (routine x 2)     Status: None (Preliminary result)   Collection Time: 03/02/19  4:55 PM  Result Value Ref Range Status   Specimen Description   Final    BLOOD RIGHT ARM Performed at [facility], [address], [place], [place] [address]    Special Requests   Final    BOTTLES DRAWN AEROBIC ONLY Blood Culture adequate volume Performed at [facility] [address] [place], [place] [address]  Culture PENDING  Incomplete   Report Status PENDING  Incomplete  MRSA PCR Screening     Status: None   Collection Time: 03/02/19  5:02 PM  Result Value Ref Range Status   MRSA by PCR NEGATIVE NEGATIVE Final    Comment:        The GeneXpert MRSA Assay (FDA approved for NASAL specimens only), is one component of a comprehensive MRSA colonization surveillance program. It is not intended to diagnose MRSA infection nor to guide or monitor treatment for MRSA infections. Performed at [facility], [place] [address] [place], [place] [address]   Culture, blood (routine x 2)     Status: None (Preliminary result)   Collection Time: 03/02/19  5:04 PM  Result Value Ref Range Status   Specimen Description   Final    BLOOD RIGHT ARM Performed at [facility], [place] [address], [place], [place] [address]    Special Requests   Final     BOTTLES DRAWN AEROBIC ONLY Blood Culture adequate volume Performed at [facility] [address], [place],  [address]    Culture PENDING  Incomplete   Report Status PENDING  Incomplete         Radiology Studies: No results found.      Scheduled Meds: . Chlorhexidine Gluconate Cloth  6 each Topical Daily  . ferrous sulfate  325 mg Oral BID WC  . gabapentin  300 mg Oral TID  . magnesium oxide  400 mg Oral Daily  . megestrol  400 mg Oral Daily  . mirtazapine  15 mg Oral QHS  . morphine  30 mg Oral Q12H  . pantoprazole  40 mg Oral Daily  . potassium chloride  20 mEq Oral Daily  . rivaroxaban  20 mg Oral Q breakfast   Continuous Infusions: . sodium chloride 125 mL/hr at 03/03/19 0900     LOS: 1 day    Time spent: 25 mins.More than 50% of that time was spent in counseling and/or coordination of care.      Shelly Coss, MD [facility] Pager [phone]  If 7PM-7AM, please contact night-coverage [email or web address] Password [email or web address] 03/03/2019, 10:55 AM

## 2019-03-03 NOTE — Progress Notes (Signed)
Manufacturing engineer [facility])  Chart reviewed, order for PMT consult for goals of care and residential vs home hospice discussion.  [facility] will continue to follow and will wait to see what plan will be after PMT consult.  Venia Carbon BSN, RN [facility] Liaison [phone]

## 2019-03-03 NOTE — Progress Notes (Signed)
Jeff [name]   [name]:28-Aug-1961   VO#:[id number]   YIR#:[id number]  Oncology follow up  Subjective: Patient is well-known to me, under my care for his metastatic colon cancer.  He was admitted from my office to hospital yesterday for failure of thrive at home, and plan to transition to hospice. He feels slightly better today, but has not eaten much, has not gotten out of bed, afebrile, VS stable.  His daughter was not in the room when I saw him.   Objective:  Vitals:   03/03/19 1200 03/03/19 1300  BP: 108/68 99/79  Pulse:    Resp: 17 16  Temp: 98.7 F (37.1 C)   SpO2:      Body mass index is 14.78 kg/m.  Intake/Output Summary (Last 24 hours) at 03/03/2019 1539 Last data filed at 03/03/2019 1300 Gross per 24 hour  Intake 2142.32 ml  Output 550 ml  Net 1592.32 ml     Sclerae unicteric, cachectic appearing  Oropharynx clear  No peripheral adenopathy  Lungs clear -- no rales or rhonchi  Heart regular rate and rhythm  Abdomen benign  MSK no focal spinal tenderness, no peripheral edema  Neuro nonfocal    CBG (last 3)  No results for input(s): GLUCAP in the last 72 hours.   Labs:  Lab Results  Component Value Date   WBC 7.0 03/03/2019   HGB 7.3 (L) 03/03/2019   HCT 25.0 (L) 03/03/2019   MCV 86.8 03/03/2019   PLT 106 (L) 03/03/2019   NEUTROABS 8.6 (H) 03/02/2019   CMP Latest Ref Rng & Units 03/03/2019 03/02/2019 02/20/2019  Glucose 70 - 99 mg/dL 100(H) 142(H) 100(H)  BUN 6 - 20 mg/dL 23(H) 33(H) <5(L)  Creatinine 0.61 - 1.24 mg/dL 0.99 1.22 0.77  Sodium 135 - 145 mmol/L 137 132(L) 135  Potassium 3.5 - 5.1 mmol/L 3.7 3.7 4.0  Chloride 98 - 111 mmol/L 109 98 106  CO2 22 - 32 mmol/L 23 23 24   Calcium 8.9 - 10.3 mg/dL 8.3(L) 8.9 8.2(L)  Total Protein 6.5 - 8.1 g/dL - 9.1(H) -  Total Bilirubin 0.3 - 1.2 mg/dL - 0.6 -  Alkaline Phos 38 - 126 U/L - 64 -  AST 15 - 41 U/L - 32 -  ALT 0 - 44 U/L - 13 -     Urine Studies No results for input(s): UHGB, CRYS in the last 72  hours.  Invalid input(s): UACOL, UAPR, USPG, UPH, UTP, UGL, UKET, UBIL, UNIT, UROB, ULEU, UEPI, UWBC, URBC, UBAC, CAST, UCOM, BILUA  Basic Metabolic Panel: Recent Labs  Lab 03/02/19 0929 03/03/19 0601  NA 132* 137  K 3.7 3.7  CL 98 109  CO2 23 23  GLUCOSE 142* 100*  BUN 33* 23*  CREATININE 1.22 0.99  CALCIUM 8.9 8.3*   GFR Estimated Creatinine Clearance: 51.6 mL/min (by C-G formula based on SCr of 0.99 mg/dL). Liver Function Tests: Recent Labs  Lab 03/02/19 0929  AST 32  ALT 13  ALKPHOS 64  BILITOT 0.6  PROT 9.1*  ALBUMIN 3.1*   No results for input(s): LIPASE, AMYLASE in the last 168 hours. No results for input(s): AMMONIA in the last 168 hours. Coagulation profile No results for input(s): INR, PROTIME in the last 168 hours.  CBC: Recent Labs  Lab 03/02/19 0928 03/03/19 0601  WBC 11.4* 7.0  NEUTROABS 8.6*  --   HGB 9.4* 7.3*  HCT 31.0* 25.0*  MCV 82.4 86.8  PLT 138* 106*   Cardiac Enzymes: No results for  input(s): CKTOTAL, CKMB, CKMBINDEX, TROPONINI in the last 168 hours. BNP: Invalid input(s): POCBNP CBG: No results for input(s): GLUCAP in the last 168 hours. D-Dimer No results for input(s): DDIMER in the last 72 hours. Hgb A1c No results for input(s): HGBA1C in the last 72 hours. Lipid Profile No results for input(s): CHOL, HDL, LDLCALC, TRIG, CHOLHDL, LDLDIRECT in the last 72 hours. Thyroid function studies No results for input(s): TSH, T4TOTAL, T3FREE, THYROIDAB in the last 72 hours.  Invalid input(s): FREET3 Anemia work up No results for input(s): VITAMINB12, FOLATE, FERRITIN, TIBC, IRON, RETICCTPCT in the last 72 hours. Microbiology Recent Results (from the past 240 hour(s))  Culture, Blood     Status: None (Preliminary result)   Collection Time: 03/02/19 11:30 AM  Result Value Ref Range Status   Specimen Description BLOOD SITE NOT SPECIFIED  Final   Special Requests   Final    BOTTLES DRAWN AEROBIC AND ANAEROBIC Blood Culture adequate  volume   Culture   Final    NO GROWTH 1 DAY Performed at [facility], [address] [place], [place] [address]    Report Status PENDING  Incomplete  Culture, blood (routine x 2)     Status: None (Preliminary result)   Collection Time: 03/02/19  4:55 PM  Result Value Ref Range Status   Specimen Description   Final    BLOOD RIGHT ARM Performed at [facility], [place] [address] [place], [place] [address]    Special Requests   Final    BOTTLES DRAWN AEROBIC ONLY Blood Culture adequate volume Performed at [facility] [address], [place], [place] [address]    Culture   Final    NO GROWTH < 24 HOURS Performed at [facility] [address], [place], [place] [address]    Report Status PENDING  Incomplete  MRSA PCR Screening     Status: None   Collection Time: 03/02/19  5:02 PM  Result Value Ref Range Status   MRSA by PCR NEGATIVE NEGATIVE Final    Comment:        The GeneXpert MRSA Assay (FDA approved for NASAL specimens only), is one component of a comprehensive MRSA colonization surveillance program. It is not intended to diagnose MRSA infection nor to guide or monitor treatment for MRSA infections. Performed at [facility], [place] [address], [place], [place] [address]   Culture, blood (routine x 2)     Status: None (Preliminary result)   Collection Time: 03/02/19  5:04 PM  Result Value Ref Range Status   Specimen Description   Final    BLOOD RIGHT ARM Performed at [facility], [place] [address], [place], [place] [address]    Special Requests   Final    BOTTLES DRAWN AEROBIC ONLY Blood Culture adequate volume Performed at [facility] [address] [place], [place] [address]    Culture   Final    NO GROWTH < 24 HOURS Performed at [facility] [address], [place], [place] [address]    Report Status PENDING  Incomplete      Studies:  No results found.  Assessment:  58 y.o. male with metastatic colon cancer, s/p multiple line therapy   1.  Failure to thrive at home, secondary to metastatic cancer 2.  Metastatic colon cancer, last chemotherapy on 1/30. Chemo held due to his recent left finger fracture and hospitalization 3.  Progressive weakness, with poor  oral intake 4.  Severe protein and calorie malnutrition, weight loss  5. Hypotension secondary to dehydration  6. Anemia secondary to chemotherapy and metastatic malignancy 7. Code status: DNR   Plan:  -I had a long conversation with patient and his daughter about goals of care and CODE STATUS, patient is agreeable with DNR/DNI -Cultures so far negative, no signs of sepsis -Appreciate palliative team's input, will set up home hospice upon discharge, although I am concerned about if he is able to function at home by himself and his daughter's help  -If culture remains to be negative and VS stable, OK to discharge when home hospice or residential hospice is ready. I will remain to be the MD when he is under hospice care.    [name], MD 03/03/2019  3:39 PM

## 2019-03-03 NOTE — Progress Notes (Signed)
Hydrologist [facility])  Hospital Liaison: RN visit     Notified by Rayann Heman, CMRN of patient/family request for [facility] services at home after discharge. Chart and patient information under review by [facility] physician. Hospice eligibility pending at this time.     Writer spoke with daughter, [name] to initiate education related to hospice philosophy, services and team approach to care.[name] verbalized understanding of information given. Per discussion, plan is for discharge to home at undetermined date.     Please send signed and completed DNR form home with patient/family. Patient will need prescriptions for discharge comfort medications.     DME needs have been discussed, patient currently has the following equipment in the home:          .  Patient/family requests the following DME for delivery to the home: [facility], [place], [place] and [place]. Hospital liaison will place order this weekend.     [facility] aware of the above. Please notify [facility] when patient is ready to leave the unit at discharge. (Call [phone] or [phone] after 5pm.) [facility] information and contact numbers given to  [facility].   Please call with any hospice related questions.     Thank you for this referral.     Farrel Gordon, RN, [place] Hospital Liaison  [phone]  ?  Hospital liaisons are now on [place].

## 2019-03-03 NOTE — Progress Notes (Signed)
Initial Nutrition Assessment  DOCUMENTATION CODES:   Severe malnutrition in context of chronic illness, Underweight  INTERVENTION:  - Will order daily multivitamin with minerals. - Continue to encourage PO intakes. - Will monitor for [place] decision.   NUTRITION DIAGNOSIS:   Severe Malnutrition related to chronic illness, cancer and cancer related treatments as evidenced by severe fat depletion, severe muscle depletion.  GOAL:   Patient will meet greater than or equal to 90% of their needs  MONITOR:   PO intake, Weight trends, Labs, I & O's  REASON FOR ASSESSMENT:   Malnutrition Screening Tool, Consult Assessment of nutrition requirement/status  ASSESSMENT:   58 y.o. male with medical history significant of metastatic colon cancer, pulmonary embolism, RLE DVT s/p IVC filter placement, recent L wrist fracture. He was sent from [facility] by his oncologist for direct admission for management of generalized weakness and poor appetite. His systolic blood pressure was found to be in the range of 80s at the [facility]. Patient was hardly able to stand on his feet. Prior to transfer to [facility], he was given IV fluids and made DNR with no plan for further chemo. Family also interested on discussion with palliative care for possible residential hospice vs hospice at home. Patient was admitted to SDU for close monitoring of blood pressure, IV fluids. Palliative Care consulted.  No intakes documented since admission. Patient laying in bed with no family/visitors present. Patient denies oral or esophageal pain. He denies abdominal pain/pressure or nausea at this time. He denies having anything for breakfast this AM and declines RD offer to order him something for breakfast. Patient is unable to recall the last time he had something to eat.  Spoke with RN who reports that patient took pills with ginger ale without issue earlier today. RN had attempted to give patient pills in pudding yesterday and he  refused. Patient has not had any foods yesterday or today, per RN report, and she reports that she was informed that patient had not eaten x1 week PTA.   Palliative Care is following and is going to meet with family today. Will monitor for [place] decisions. Will not order oral nutrition supplements at this time as patient is refusing PO intakes.   Per chart review, current weight is 100 lb. Weight on 02/09/2019 was 123 lb. This indicates 23 lb weight loss (19% body weight) in the past 1 month; significant for time frame. Noted current IV fluid rate; some of weight loss may be attributable to dehydration PTA.   Medications reviewed; 325 mg ferrous sulfate BID, 400 mg mag-ox/day, 400 mg megace/day, 20 mEq K-Dur/day. Labs reviewed; BUN: 23 mg/dl, Ca: 8.3 mg/dl. IVF; NS @ 125 ml/hr.       NUTRITION - FOCUSED PHYSICAL EXAM:    Most Recent Value  Orbital Region  Moderate depletion  Upper Arm Region  Severe depletion  Thoracic and Lumbar Region  Unable to assess  Buccal Region  Severe depletion  Temple Region  Moderate depletion  Clavicle Bone Region  Severe depletion  Clavicle and Acromion Bone Region  Severe depletion  Scapular Bone Region  Unable to assess  Dorsal Hand  Moderate depletion  Patellar Region  Severe depletion  Anterior Thigh Region  Unable to assess  Posterior Calf Region  Severe depletion  Edema (RD Assessment)  None  Hair  Reviewed  Eyes  Reviewed  Mouth  Reviewed  Skin  Reviewed  Nails  Reviewed       Diet Order:   Diet  Order            Diet regular Room service appropriate? Yes; Fluid consistency: Thin  Diet effective now              EDUCATION NEEDS:   Not appropriate for education at this time  Skin:  Skin Assessment: Reviewed RN Assessment  Last BM:  3/6  Height:   Ht Readings from Last 1 Encounters:  03/02/19 5\' 9"  (1.753 m)    Weight:   Wt Readings from Last 1 Encounters:  03/02/19 45.4 kg    Ideal Body Weight:  72.73 kg  BMI:  Body  mass index is 14.78 kg/m.  Estimated Nutritional Needs:   Kcal:  7014-1030 kcal  Protein:  85-100 grams  Fluid:  >/= 1.8 L/day     Jarome Matin, MS, RD, LDN, [facility] Inpatient Clinical Dietitian Pager # [phone] After hours/weekend pager # [phone]

## 2019-03-04 ENCOUNTER — Inpatient Hospital Stay: Payer: Medicaid Other

## 2019-03-04 ENCOUNTER — Telehealth: Payer: Self-pay | Admitting: Hematology

## 2019-03-04 LAB — CBC WITH DIFFERENTIAL/PLATELET
Band Neutrophils: 0 %
Basophils Absolute: 0 10*3/uL (ref 0.0–0.1)
Basophils Relative: 0 %
Blasts: 0 %
Eosinophils Absolute: 0.1 10*3/uL (ref 0.0–0.5)
Eosinophils Relative: 2 %
HCT: 21.6 % — ABNORMAL LOW (ref 39.0–52.0)
Hemoglobin: 6.1 g/dL — CL (ref 13.0–17.0)
Lymphocytes Relative: 28 %
Lymphs Abs: 1.5 10*3/uL (ref 0.7–4.0)
MCH: 25.3 pg — ABNORMAL LOW (ref 26.0–34.0)
MCHC: 28.2 g/dL — ABNORMAL LOW (ref 30.0–36.0)
MCV: 89.6 fL (ref 80.0–100.0)
Metamyelocytes Relative: 0 %
Monocytes Absolute: 0.6 10*3/uL (ref 0.1–1.0)
Monocytes Relative: 12 %
Myelocytes: 0 %
Neutro Abs: 3.1 10*3/uL (ref 1.7–7.7)
Neutrophils Relative %: 58 %
Other: 0 %
Platelets: 100 10*3/uL — ABNORMAL LOW (ref 150–400)
Promyelocytes Relative: 0 %
RBC: 2.41 MIL/uL — AB (ref 4.22–5.81)
RDW: 20.4 % — ABNORMAL HIGH (ref 11.5–15.5)
WBC: 5.3 10*3/uL (ref 4.0–10.5)
nRBC: 0 % (ref 0.0–0.2)
nRBC: 0 /100 WBC

## 2019-03-04 LAB — URINE CULTURE

## 2019-03-04 LAB — PREPARE RBC (CROSSMATCH)

## 2019-03-04 LAB — HEMOGLOBIN AND HEMATOCRIT, BLOOD
HCT: 33.5 % — ABNORMAL LOW (ref 39.0–52.0)
Hemoglobin: 10.1 g/dL — ABNORMAL LOW (ref 13.0–17.0)

## 2019-03-04 MED ORDER — SODIUM CHLORIDE 0.9% IV SOLUTION: Freq: Once | INTRAVENOUS | Status: DC

## 2019-03-04 NOTE — Progress Notes (Signed)
CRITICAL VALUE ALERT  Critical Value:  Hemoglobin 6.1  Date & Time Notied: 03/04/2019 0348  Provider Notified: Kennon Holter, NP  Orders Received/Actions taken: awaiting orders

## 2019-03-04 NOTE — Telephone Encounter (Signed)
I got a call from pt's floor nurse regarding his hospice discharge plan, home hospice vs residential hospice. I spoke with hospice coordinator Lattie Haw. I called pt's daughter [name] who was in the room with pt. I explained the home hospice service to pt and [place]. Pt lives alone, but he states his girl friend maybe able to help some, and [name] will check and stay with him frequently (she works though). She will discussed with pt again, and let us know their decision on home hospice. I will communicate with palliative and hospitalist team.  [name]  03/04/2019 1:32 PM

## 2019-03-04 NOTE — Progress Notes (Signed)
Manufacturing engineer [facility]) Hospital Liaison RN Visit 12:50pm  RN received notice that liaison was requested for this pt. RN spoke with [name] concerning hospice services for [facility] verses Home with Hospice. RN encouraged [name] to consult with the pt and daughter once again on [place] prior to confirming with the daughter on DME in the home. Liaison also had a discussion at bedside with the pt and daughter Claris Gower [phone]) concerning the difference LOC with home with hospice, [facility] or facility discharge (ALF). All residences [facility] are able to accommodate pt's with Hospice services. Daughter indicates pt is not ready for discharge today but the family will continue to discuss this information prior to making a decision. Liaison offered to visit tomorrow if further questions or inquires remain. Pt very receptive and commented for liaison to "wake me up" when visiting at bedside.   RN also spoke with [name] with an update concerning all that was discussed above with the pt. [name] will update liaison further on tomorrow on pt's discharge disposition.  Please contact liaison with any related questions or inquires  Raina Mina, RN, Copywriter, advertising [facility]) [place] are on [place].

## 2019-03-04 NOTE — Progress Notes (Signed)
PMT progress note  Patient seen briefly this am, resting in bed. No family at bedside.   Chart reviewed and overnight events noted.   BP (!) 120/95   Pulse 89   Temp 98.9 F (37.2 C) (Oral)   Resp 19   Ht 5\' 9"  (1.753 m)   Wt 45.4 kg   SpO2 100%   BMI 14.78 kg/m  Labs and imaging noted.  Appreciate hospice follow up.   Resting in bed Regular pattern of breathing Appears weak Does not arouse to his name being called.   Discussed with patient's RN, also discussed with Dr Burr Medico over the phone.  Ongoing efforts to coordinate hospice services at the time of discharge, pending further discussions and further conversations. At the time of initial consult, reassured patient that we will keep his wishes in mind and try to provide patient concordant goals of care.   Anticipate home with hospice discharge soon.  No new PMT specific recommendations at this time.  15 minutes spent Loistine Chance MD [facility] health palliative medicine team [phone].

## 2019-03-04 NOTE — Progress Notes (Signed)
PROGRESS NOTE    Jeff [name]  ZOX:[id number] [name]: 06-Jan-1961 [name]: 03/02/2019 PCP: Patient, [name]   Brief Narrative: Jeff [name] is a 58 y.o. male with medical history significant of metastatic colon cancer, pulmonary embolism, right lower extremity DVT on anticoagulation and IVC filter placement, recent left wrist fracture who was sent from cancer center by his oncologist for direct admission for management of generalized weakness and poor appetite.  He was taken to the cancer center for his regular appointment for chemotherapy.  He was found to be very weak.  Daughter says that he has been on anything for last 1 week.  His systolic blood pressure was found to be in the range of 80s in the cancer center.  Patient was hardly able to stand on his feet.  His oncologist, Dr. [name], discussed with the patient and family and made her DNR.  No plan for further chemotherapy.  He was given IV fluids at the cancer center.  Family were interested on discussion with palliative care for possible residential hospice versus hospice at home.   Palliative care consulted.  Family considering home with hospice now.  Anticipate discharge tomorrow to home with hospice  .  Assessment & Plan:   Principal Problem:   Generalized weakness Active Problems:   Protein-calorie malnutrition, severe   Primary colon cancer with metastasis to other site [facility])   History of deep vein thrombosis (DVT) of lower extremity   Anemia in neoplastic disease   Loss of appetite   Hypotension   Left wrist fracture   Weakness   Generalized weakness: Secondary to his progressive metastatic disease, poor oral intake, hypotension.  Likely chemotherapy-induced too. We will continue IV fluids.  Poor appetite: Has not eaten anything since last a week.  Started  on Megace.  Dietitian consulted.  Continue mirtazapine.  Hypotension: Continue IV fluids.  Blood pressure has improved today.  Metastatic colon cancer: Diagnosed in  January 2018, follows with oncology.  On his last admission there was concern for bowel perforation.  Imagings did not suggest so and he was discharged home.  Was on chemotherapy.  On FOLFIRI and Avastin. has colostomy also. There is no plan for further chemotherapy as per his oncologist.   Patient is a DNR now. Palliative care following.  Hospice already consulted.  Anticipate discharge to home with hospice in 1 to 2 days.   History of left/right lower extremity DVT/PE: On Xarelto status post thrombectomy.  Status post IVC filter.  Severe protein calorie malnutrition: Secondary to malignancy.  Dietitian consulted.  Anemia associated with malignancy: Hemoglobin dropped in the range of 6 today.  Will transfuse her with units of PRBC.  Cancer associated abdominal pain: Continue pain management.  On MS Contin and oxycodone.  Left wrist fracture: Splint/cast on the left upper extremity .Follows with orthopedics.  Diarrhea: Chemo related.  Continue Imodium as needed.   Nutrition Problem: Severe Malnutrition Etiology: chronic illness, cancer and cancer related treatments      DVT prophylaxis: Xarelto Code Status: Full Family Communication: Son and daughter at the bedside Disposition Plan: Hospice at home after final decision by the daughter.  Anticipated for tomorrow.   Consultants: Palliative care  Procedures: None  Antimicrobials:  Anti-infectives (From admission, onward)   None      Subjective: Patient seen and examined the bedside this morning.  Remains comfortable.  Blood pressure has improved today.  He is being transfused with 2 units of PRBC.  Denies any complaints.  Objective: Vitals:   03/04/19 1000 03/04/19 1030 03/04/19 1223 03/04/19 1230  BP: 117/76 130/78 127/77   Pulse:  81 89   Resp: 10 12 19    Temp: 97.7 F (36.5 C) 98.2 F (36.8 C) 98.2 F (36.8 C) 98.9 F (37.2 C)  TempSrc: Oral Oral Oral Oral  SpO2: 99% 100% 100%   Weight:      Height:          Intake/Output Summary (Last 24 hours) at 03/04/2019 1410 Last data filed at 03/04/2019 1223 Gross per 24 hour  Intake 3980.62 ml  Output 900 ml  Net 3080.62 ml   Filed Weights   03/02/19 1554  Weight: 45.4 kg    Examination:  General exam: Frail/cachectic/generalized weakness  HEENT:PERRL,Oral mucosa moist, Ear/Nose normal on gross exam Respiratory system: Bilateral equal air entry, normal vesicular breath sounds, no wheezes or crackles  Cardiovascular system: S1 & S2 heard, RRR. No JVD, murmurs, rubs, gallops or clicks.  Chemo-Port on the right chest Gastrointestinal system: Abdomen is nondistended, soft and nontender. No organomegaly or masses felt. Normal bowel sounds heard. Central nervous system: Alert and oriented. No focal neurological deficits. Extremities: No edema, no clubbing ,no cyanosis, distal peripheral pulses palpable. Cast on the left upper extremity Skin: No rashes, lesions or ulcers,no icterus ,no pallor MSK: Muscle wasting.     Data Reviewed: I have personally reviewed following labs and imaging studies  CBC: Recent Labs  Lab 03/02/19 0928 03/03/19 0601 03/04/19 0321  WBC 11.4* 7.0 5.3  NEUTROABS 8.6*  --  3.1  HGB 9.4* 7.3* 6.1*  HCT 31.0* 25.0* 21.6*  MCV 82.4 86.8 89.6  PLT 138* 106* 409*   Basic Metabolic Panel: Recent Labs  Lab 03/02/19 0929 03/03/19 0601  NA 132* 137  K 3.7 3.7  CL 98 109  CO2 23 23  GLUCOSE 142* 100*  BUN 33* 23*  CREATININE 1.22 0.99  CALCIUM 8.9 8.3*   GFR: Estimated Creatinine Clearance: 51.6 mL/min (by C-G formula based on SCr of 0.99 mg/dL). Liver Function Tests: Recent Labs  Lab 03/02/19 0929  AST 32  ALT 13  ALKPHOS 64  BILITOT 0.6  PROT 9.1*  ALBUMIN 3.1*   No results for input(s): LIPASE, AMYLASE in the last 168 hours. No results for input(s): AMMONIA in the last 168 hours. Coagulation Profile: No results for input(s): INR, PROTIME in the last 168 hours. Cardiac Enzymes: No results for  input(s): CKTOTAL, CKMB, CKMBINDEX, TROPONINI in the last 168 hours. BNP (last 3 results) No results for input(s): PROBNP in the last 8760 hours. HbA1C: No results for input(s): HGBA1C in the last 72 hours. CBG: No results for input(s): GLUCAP in the last 168 hours. Lipid Profile: No results for input(s): CHOL, HDL, LDLCALC, TRIG, CHOLHDL, LDLDIRECT in the last 72 hours. Thyroid Function Tests: No results for input(s): TSH, T4TOTAL, FREET4, T3FREE, THYROIDAB in the last 72 hours. Anemia Panel: No results for input(s): VITAMINB12, FOLATE, FERRITIN, TIBC, IRON, RETICCTPCT in the last 72 hours. Sepsis Labs: No results for input(s): PROCALCITON, LATICACIDVEN in the last 168 hours.  Recent Results (from the past 240 hour(s))  Culture, Blood     Status: None (Preliminary result)   Collection Time: 03/02/19 11:30 AM  Result Value Ref Range Status   Specimen Description BLOOD SITE NOT SPECIFIED  Final   Special Requests   Final    BOTTLES DRAWN AEROBIC AND ANAEROBIC Blood Culture adequate volume   Culture   Final    NO GROWTH  2 DAYS Performed at [facility], [place] [address], [place], [place] [address]    Report Status PENDING  Incomplete  Culture, blood (routine x 2)     Status: None (Preliminary result)   Collection Time: 03/02/19  4:55 PM  Result Value Ref Range Status   Specimen Description   Final    BLOOD RIGHT ARM Performed at [facility], [place] [address] [place], [place] [address]    Special Requests   Final    BOTTLES DRAWN AEROBIC ONLY Blood Culture adequate volume Performed at [place] [address], [place], [place] [address]    Culture   Final    NO GROWTH 2 DAYS Performed at [place] [address] [place], [place] [address]    Report Status PENDING  Incomplete  MRSA PCR Screening     Status: None   Collection Time: 03/02/19  5:02 PM  Result Value Ref Range Status   MRSA by PCR NEGATIVE NEGATIVE Final    Comment:         The GeneXpert MRSA Assay (FDA approved for NASAL specimens only), is one component of a comprehensive MRSA colonization surveillance program. It is not intended to diagnose MRSA infection nor to guide or monitor treatment for MRSA infections. Performed at [facility], [place] [address], [place], [place] [address]   Culture, blood (routine x 2)     Status: None (Preliminary result)   Collection Time: 03/02/19  5:04 PM  Result Value Ref Range Status   Specimen Description   Final    BLOOD RIGHT ARM Performed at [facility], [place] [address] [place], [place] [address]    Special Requests   Final    BOTTLES DRAWN AEROBIC ONLY Blood Culture adequate volume Performed at [place] [address] [place], [place] [address]    Culture   Final    NO GROWTH 2 DAYS Performed at [place] [address], [place], [place] [address]    Report Status PENDING  Incomplete  Culture, Urine     Status: None   Collection Time: 03/03/19 10:08 AM  Result Value Ref Range Status   Specimen Description   Final    URINE, CLEAN CATCH Performed at [facility], [place] [address], [place], [place] [address]    Special Requests   Final    NONE Performed at [facility], [name] [address], [place], [place] [address]    Culture   Final    Multiple bacterial morphotypes present, none predominant. Suggest appropriate recollection if clinically indicated.   Report Status 03/04/2019 FINAL  Final         Radiology Studies: No results found.      Scheduled Meds: . sodium chloride   Intravenous Once  . Chlorhexidine Gluconate Cloth  6 each Topical Daily  . ferrous sulfate  325 mg Oral BID WC  . gabapentin  300 mg Oral TID  . magnesium oxide  400 mg Oral Daily  . megestrol  400 mg Oral Daily  . mirtazapine  15 mg Oral QHS  . morphine  30 mg Oral Q12H  . multivitamin with minerals  1 tablet Oral Daily    . pantoprazole  40 mg Oral Daily  . potassium chloride  20 mEq Oral Daily  . rivaroxaban  20 mg Oral Q breakfast   Continuous Infusions: . sodium chloride 125 mL/hr at 03/04/19  1000     LOS: 2 days    Time spent: 25 mins.More than 50% of that time was spent in counseling and/or coordination of care.      Shelly Coss, MD [facility] Pager [phone]  If 7PM-7AM, please contact night-coverage [email or web address] Password [email or web address] 03/04/2019, 2:10 PM

## 2019-03-05 DIAGNOSIS — E861 Hypovolemia: Principal | ICD-10-CM

## 2019-03-05 DIAGNOSIS — D630 Anemia in neoplastic disease: Secondary | ICD-10-CM

## 2019-03-05 DIAGNOSIS — Z515 Encounter for palliative care: Secondary | ICD-10-CM

## 2019-03-05 DIAGNOSIS — E43 Unspecified severe protein-calorie malnutrition: Secondary | ICD-10-CM

## 2019-03-05 DIAGNOSIS — C189 Malignant neoplasm of colon, unspecified: Secondary | ICD-10-CM

## 2019-03-05 DIAGNOSIS — Z7189 Other specified counseling: Secondary | ICD-10-CM

## 2019-03-05 DIAGNOSIS — I9589 Other hypotension: Secondary | ICD-10-CM

## 2019-03-05 LAB — BPAM RBC
Blood Product Expiration Date: 202004042359
Blood Product Expiration Date: 202004052359
ISSUE DATE / TIME: 202003070959
ISSUE DATE / TIME: 202003070604
UNIT TYPE AND RH: 5100
Unit Type and Rh: 5100

## 2019-03-05 LAB — TYPE AND SCREEN
ABO/RH(D): O POS
Antibody Screen: NEGATIVE
UNIT DIVISION: 0
Unit division: 0

## 2019-03-05 LAB — CBC WITH DIFFERENTIAL/PLATELET
ABS IMMATURE GRANULOCYTES: 0.09 10*3/uL — AB (ref 0.00–0.07)
BASOS PCT: 1 %
Basophils Absolute: 0.1 10*3/uL (ref 0.0–0.1)
EOS PCT: 1 %
Eosinophils Absolute: 0.1 10*3/uL (ref 0.0–0.5)
HCT: 32.5 % — ABNORMAL LOW (ref 39.0–52.0)
Hemoglobin: 9.5 g/dL — ABNORMAL LOW (ref 13.0–17.0)
Immature Granulocytes: 1 %
Lymphocytes Relative: 23 %
Lymphs Abs: 1.7 10*3/uL (ref 0.7–4.0)
MCH: 26 pg (ref 26.0–34.0)
MCHC: 29.2 g/dL — ABNORMAL LOW (ref 30.0–36.0)
MCV: 88.8 fL (ref 80.0–100.0)
MONOS PCT: 12 %
Monocytes Absolute: 0.9 10*3/uL (ref 0.1–1.0)
Neutro Abs: 4.5 10*3/uL (ref 1.7–7.7)
Neutrophils Relative %: 62 %
PLATELETS: 137 10*3/uL — AB (ref 150–400)
RBC: 3.66 MIL/uL — ABNORMAL LOW (ref 4.22–5.81)
RDW: 18.7 % — ABNORMAL HIGH (ref 11.5–15.5)
WBC: 7.3 10*3/uL (ref 4.0–10.5)
nRBC: 0 % (ref 0.0–0.2)

## 2019-03-05 MED ORDER — HEPARIN SOD (PORK) LOCK FLUSH 100 UNIT/ML IV SOLN
500.0000 [IU] | INTRAVENOUS | Status: AC | PRN
  Administered 2019-03-05: 500 [IU]

## 2019-03-05 MED ORDER — ADULT MULTIVITAMIN W/MINERALS CH: 1.0000 | ORAL_TABLET | Freq: Every day | ORAL | 0 refills | Status: AC

## 2019-03-05 NOTE — Care Management Note (Addendum)
Case Management Note  Patient Details  Name: Jeff [name] MRN: [id number] Date of Birth: 12/25/1961  Subjective/Objective:    Pt presented with Hypotension, AKI, mestatic colon cancer, lives at home alone                Action/Plan: NCM received call from [facility] and DME has been arranged with [facility] and scheduled delivery to home today. Dtr will provide transportation home. Dtr and pt's girlfriend will be in the home to assist with care. Updated Unit RN.   4:00 pm NCM spoke to dtr, Jeff [name]. States she has cleaned room and [facility] scheduled to deliver on tomorrow. Placed [facility] form in chart for attending to sign. Will have NCM fax to [facility] on 03/06/2019. Dtr works and pt will need assistance at home during the day. Message sent to attending. Left copy in room for dtr.   Expected Discharge Date:  03/05/19               Expected Discharge Plan:  Home w Hospice Care  In-House Referral:  Clinical Social Work  Discharge planning Services  CM Consult  Post Acute Care Choice:  Hospice Choice offered to:  Adult Children  DME Arranged:  Other see comment DME Agency:  [facility]  HH Arranged:  RN [place] Agency:  [facility]  Status of Service:  Completed, signed off  If discussed at [place] of Stay Meetings, dates discussed:    Additional Comments:  Erenest Rasher, RN 03/05/2019, 11:02 AM

## 2019-03-05 NOTE — Progress Notes (Signed)
Per Case Management, patient will be discharged home with hospice care. Patient daughter will come pick up patient to take him home, once equipment is in place. Orders for IV team to come de-access his port-a-cath.

## 2019-03-05 NOTE — Progress Notes (Signed)
Patient Daughter called this RN and said the equipment will not be delivered until tomorrow. After talking to MD, patient discharge will be postponed until tomorrow and orders will be placed to move patient to the floor overnight.

## 2019-03-05 NOTE — Progress Notes (Signed)
[facility]) Hospital Liaison: RN 0930   Pt to be discharged today with [facility] services at home after discharge. Pt has been approved for [employer] services in the home.   Liaison spoke with daughter [name] [phone]) to confirm she is aware of pt's discharge today. Pt will be transported home by his daughter [name]).  Liaison will also visit pt at bedside for any additional needs.  Please send signed and completed DNR form home with patient/family. Patient will need prescriptions for discharge comfort medications.   DME needs have been discussed and patient/family requests the following DME for delivery to the home: Hospital Bed with full rails, bedside tray, 3-in-1 commode, walker and wheelchair.  Systems developer has been notified. Liaison will contact [facility] to arrange delivery to the home. Home address has been verified and is correct in the chart.        Daughter [name] [phone] is the family member to contact to arrange time of delivery.   [facility] aware of the above. Please notify [facility] when patient is ready to leave the unit at discharge. (Call [phone] or [phone] after 5pm.) [facility] information and contact numbers given to the daughter [name] at the time of the call today. Above information shared with CMRN and the IDT.   Please call with any hospice related questions.   Thank you for this referral.?  Vicente Serene, BSN [facility]) Hospital Liaison [phone]  [place] are on [place]

## 2019-03-05 NOTE — Progress Notes (Signed)
PMT progress note  Patient seen   this am, resting in bed. No family at bedside.   Chart reviewed and overnight events noted.   BP 103/71   Pulse 89   Temp 99.2 F (37.3 C) (Oral)   Resp 14   Ht 5\' 9"  (6.945 m)   Wt 45.4 kg   SpO2 100%   BMI 14.78 kg/m  Labs and imaging noted.  Appreciate hospice follow up.   Resting in bed Regular pattern of breathing Appears weak  awake alert  Discussed with patient this morning, he wants to go home with hospice support.  Patient called his daughter from his cell phone in the room, it went to voicemail.   I have discussed with hospice liaison arrangements are being made for the patient to have medical equipment delivered and for the patient to go home with hospice soon.     Anticipate home with hospice discharge soon.  No new PMT specific recommendations at this time.  15 minutes spent Loistine Chance MD [facility] health palliative medicine team [phone].

## 2019-03-05 NOTE — Discharge Summary (Signed)
Physician Discharge Summary  Jeff [name] HER:[id number] [name]: 09-03-1961 [name]: 03/02/2019  PCP: Patient, [name]  Admit date: 03/02/2019 Discharge date: 03/05/2019  Admitted From: Home  Disposition:  Home   Recommendations for Outpatient Follow-up and new medication changes:  1. Follow up with Hospice services.  2. Code status has been changed to DNR  Home Health: no   Equipment/Devices: Home hospice   Discharge Condition: stable  CODE STATUS: dnr   Diet recommendation: regular.   Brief/Interim Summary: 58 year old male who presented with generalized weakness and poor appetite.  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  Reported 7 days of worsening symptoms, generalized weakness, and poor oral intake.  He was evaluated by Dr. [name] in the outpatient oncology office, his blood pressure was found in the 80's, patient appeared weak and deconditioned, received IV fluids, his CODE STATUS was changed to DNR, and with no further plans for chemotherapy, he was transferred to the hospital. On his initial physical examination blood pressure was 110/67, heart rate 96, respiratory rate 22, temperature 98.2, oxygen saturation 94% he had dry mucous membranes, his lungs were clear to auscultation bilaterally, heart S1-S2 present, abdomen soft, no lower extremity edema.  Sodium 132, potassium 3.7, chloride 90, bicarbonate 30, glucose 143, BUN 30 creatinine 1.2, white count 11.4, hemoglobin 9.1, hematocrit 31.6, platelets 138.  Patient was admitted to the hospital with the working diagnosis of hypotension due to hypovolemia, complicated with AKI.    1. Hypotension due to hypovolemia.  Patient received isotonic IV fluids with improvement of his hemodynamics, his discharge blood pressure is 119/78, with a heart rate of 69, oxygen saturation 100% on room  air.  His oral intake has increased.  No nausea or vomiting.  2. AKI.  He tolerated well IV isotonic fluids, his discharge creatinine 0.99, potassium 3.7, serum bicarbonate 23 with a sodium 137.  3. Metastatic colon cancer.  Patient does have a poor prognosis, no further cancer treatment, patient will continue care with hospice services. Continue home regimen with opioid analgesics.   4. Severe protein calorie malnutrition.  Patient received nutritional supplements.  5. Acute on chronic anemia related to anemia of malignancy and iron deficiency.  After fluid resuscitation, his hemoglobin dropped to 6.1, revealing severe anemia likely related to chronic disease and malignancy, no signs of active bleeding, patient received packed red blood cell transfusion with a discharge hemoglobin of 9.5. Continue iron supplements.   7. Hx bilateral lower extremities dvt and pulmonary embolism.  Patient will continue anticoagulation with rivaroxaban.  8. Lest wrist fracture.   Cast in place, continue pain control.  9. Depression. Continue mirtazapine.   Discharge Diagnoses:  Principal Problem:   Generalized weakness Active Problems:   Protein-calorie malnutrition, severe   Primary colon cancer with metastasis to other site [facility])   History of deep vein thrombosis (DVT) of lower extremity   Anemia in neoplastic disease   Loss of appetite   Hypotension   Left wrist fracture   Weakness    Discharge Instructions   Allergies as of 03/05/2019   No Known Allergies     Medication List    STOP taking these medications   chlorproMAZINE 10 MG tablet Commonly known as:  THORAZINE   hydrocortisone 2.5 % cream   magnesium oxide 400 (241.3 Mg) MG tablet Commonly known as:  MAG-OX  TAKE these medications   diphenoxylate-atropine 2.5-0.025 MG tablet Commonly known as:  LOMOTIL Take 2 tablets by mouth 4 (four) times daily as needed for diarrhea or loose stools.   feeding supplement (ENSURE  ENLIVE) Liqd Take 237 mLs by mouth 3 (three) times daily.   ferrous sulfate 325 (65 FE) MG tablet Take 1 tablet (325 mg total) by mouth 2 (two) times daily with a meal.   gabapentin 300 MG capsule Commonly known as:  NEURONTIN Take 1 capsule (300 mg total) by mouth 3 (three) times daily.   hyoscyamine 0.125 MG SL tablet Commonly known as:  LEVSIN SL Place 1 tablet (0.125 mg total) under the tongue every 6 (six) hours as needed. What changed:  reasons to take this   mirtazapine 15 MG tablet Commonly known as:  Remeron Take 1 tablet (15 mg total) by mouth at bedtime.   morphine 30 MG 12 hr tablet Commonly known as:  MS CONTIN Take 1 tablet (30 mg total) by mouth every 12 (twelve) hours.   multivitamin with minerals Tabs tablet Take 1 tablet by mouth daily for 30 days. Start taking on:  March 06, 2019   ondansetron 8 MG tablet Commonly known as:  Zofran Take 1 tablet (8 mg total) by mouth 2 (two) times daily as needed for refractory nausea / vomiting. Start on day 3 after chemotherapy.   Oxycodone HCl 10 MG Tabs Take 1 tablet (10 mg total) by mouth every 6 (six) hours. What changed:  Another medication with the same name was removed. Continue taking this medication, and follow the directions you see here.   pantoprazole 40 MG tablet Commonly known as:  Protonix Take 1 tablet (40 mg total) by mouth daily.   potassium chloride SA 20 MEQ tablet Commonly known as:  K-DUR,KLOR-CON Take 1 tablet (20 mEq total) by mouth daily.   prochlorperazine 10 MG tablet Commonly known as:  COMPAZINE Take 1 tablet (10 mg total) by mouth every 6 (six) hours as needed (Nausea or vomiting).   rivaroxaban 20 MG Tabs tablet Commonly known as:  Xarelto Take 1 tablet (20 mg total) by mouth daily with supper.       No Known Allergies  Consultations:  Palliative Care  Oncology    Procedures/Studies: Dg Chest 2 View  Result Date: 02/16/2019 CLINICAL DATA:  Colon cancer. Weakness.  EXAM: CHEST - 2 VIEW COMPARISON:  CT chest 12/19/2018. Chest radiograph 01/08/2018. FINDINGS: Unchanged Port-A-Cath. Normal heart size. No consolidation, edema, or pulmonary nodules. No visible free air. Apparent severe malnutrition. No acute osseous findings. IMPRESSION: 1. No active cardiopulmonary disease. Electronically Signed   By: Staci Righter M.D.   On: 02/16/2019 10:15   Ct Abdomen Pelvis W Contrast  Result Date: 02/15/2019 CLINICAL DATA:  Abdominal pain.  History of colon cancer. EXAM: CT ABDOMEN AND PELVIS WITH CONTRAST TECHNIQUE: Multidetector CT imaging of the abdomen and pelvis was performed using the standard protocol following bolus administration of intravenous contrast. CONTRAST:  147mL ISOVUE-300 IOPAMIDOL (ISOVUE-300) INJECTION 61% COMPARISON:  01/04/2019 CT abdomen pelvis FINDINGS: Lower chest: Lung bases are clear. Hepatobiliary: Normal patent contours. No biliary dilatation. The gallbladder is normal. No focal liver lesion. Pancreas: Hypoattenuating focus at the tail of the pancreas is unchanged, likely invading the pancreatic parenchyma. No dilatation of the pancreatic duct. Spleen: Low-density mass at the inferior splenic hilum is unchanged. Adrenals/Urinary Tract: The adrenal glands are normal. Both kidneys are normal. Urinary bladder is distended. Stomach/Bowel: Right lower quadrant dual lumen colostomy without  evidence of obstruction. Edematous appearance on the prior scan has improved. Soft tissue mass at the splenic flexure, invading the splenic hilum and pancreatic tail is unchanged, measuring approximately 5.1 cm. Small foci of adjacent gas may be extraluminal (coronal image 51, axial image 22). No dilated small bowel. Vascular/Lymphatic: There is aortic atherosclerotic calcification. Extensive anterior abdominal collaterals are again demonstrated, in the context of chronic occlusion of the splenic vein. There is a stent within the left iliac vein without opacification. Soft  tissue density surrounding the superior mesenteric artery is unchanged. Reproductive: Prostate is unremarkable. Other: None Musculoskeletal: No acute or significant osseous findings. IMPRESSION: 1. Unchanged size of colonic mass at the splenic flexure with extraluminal extension and invasion of the splenic hilum and pancreatic tail. Multiple nearby punctate foci of gas may be extraluminal, which would suggest microperforation. No large volume pneumoperitoneum. 2. Right lower quadrant transverse colostomy with decreased edema of the efferent limb. 3. 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 4. Soft tissue mass along the anterior aspect of the proximal superior mesenteric artery, unchanged. Electronically Signed   By: Ulyses Jarred M.D.   On: 02/15/2019 06:07       Subjective: This am patient is feeling well, no nausea or vomiting and tolerating po well. No chest pain, abdominal pain, or dyspnea. He prefers to go home with home hospices.   Discharge Exam: Vitals:   03/05/19 0800 03/05/19 0834  BP: 119/78   Pulse:    Resp:    Temp:  99.2 F (37.3 C)  SpO2: 100%    Vitals:   03/05/19 0400 03/05/19 0700 03/05/19 0800 03/05/19 0834  BP: 97/67 99/63 119/78   Pulse:      Resp: 14     Temp: 98.7 F (37.1 C)   99.2 F (37.3 C)  TempSrc: Oral   Oral  SpO2: 100% 100% 100%   Weight:      Height:        General: deconditioned  Neurology: Awake and alert, non focal  E ENT: mild pallor, no icterus, oral mucosa moist Cardiovascular: No JVD. S1-S2 present, rhythmic, no gallops, rubs, or murmurs. Trace lower extremity edema. Pulmonary: vesicular breath sounds bilaterally, adequate air movement, no wheezing, rhonchi or rales. Gastrointestinal. Abdomen with no organomegaly, non tender, no rebound or guarding/ colostomy in place.  Skin. No rashes Musculoskeletal: no joint deformities/ left upper extremity with cast  in place.    The results of significant diagnostics from this hospitalization (including imaging, microbiology, ancillary and laboratory) are listed below for reference.     Microbiology: Recent Results (from the past 240 hour(s))  Culture, Blood     Status: None (Preliminary result)   Collection Time: 03/02/19 11:30 AM  Result Value Ref Range Status   Specimen Description BLOOD SITE NOT SPECIFIED  Final   Special Requests   Final    BOTTLES DRAWN AEROBIC AND ANAEROBIC Blood Culture adequate volume   Culture   Final    NO GROWTH 3 DAYS Performed at [facility], [address], [place], [place] [address]    Report Status PENDING  Incomplete  Culture, blood (routine x 2)     Status: None (Preliminary result)   Collection Time: 03/02/19  4:55 PM  Result Value Ref Range Status   Specimen Description   Final    BLOOD RIGHT ARM Performed at [facility], [place] [address] [place], [place] [address]  Special Requests   Final    BOTTLES DRAWN AEROBIC ONLY Blood Culture adequate volume Performed at [place] [address] [place], [place] [address]    Culture   Final    NO GROWTH 3 DAYS Performed at [facility], [place] [address], [place], [place] [address]    Report Status PENDING  Incomplete  MRSA PCR Screening     Status: None   Collection Time: 03/02/19  5:02 PM  Result Value Ref Range Status   MRSA by PCR NEGATIVE NEGATIVE Final    Comment:        The GeneXpert MRSA Assay (FDA approved for NASAL specimens only), is one component of a comprehensive MRSA colonization surveillance program. It is not intended to diagnose MRSA infection nor to guide or monitor treatment for MRSA infections. Performed at [facility], [place] [address], [place], [place] [address]   Culture, blood (routine x 2)     Status: None (Preliminary result)   Collection Time: 03/02/19  5:04 PM  Result Value Ref Range Status   Specimen  Description   Final    BLOOD RIGHT ARM Performed at [facility], [place] [address] [place], [place] [address]    Special Requests   Final    BOTTLES DRAWN AEROBIC ONLY Blood Culture adequate volume Performed at [place] [address] [place], [place] [address]    Culture   Final    NO GROWTH 3 DAYS Performed at [facility], [place] [address], [place], [place] [address]    Report Status PENDING  Incomplete  Culture, Urine     Status: None   Collection Time: 03/03/19 10:08 AM  Result Value Ref Range Status   Specimen Description   Final    URINE, CLEAN CATCH Performed at [facility], [place] [address] [place], [place] [address]    Special Requests   Final    NONE Performed at [facility], [place] [address] [place], [place] [address]    Culture   Final    Multiple bacterial morphotypes present, none predominant. Suggest appropriate recollection if clinically indicated.   Report Status 03/04/2019 FINAL  Final     Labs: BNP (last 3 results) No results for input(s): BNP in the last 8760 hours. Basic Metabolic Panel: Recent Labs  Lab 03/02/19 0929 03/03/19 0601  NA 132* 137  K 3.7 3.7  CL 98 109  CO2 23 23  GLUCOSE 142* 100*  BUN 33* 23*  CREATININE 1.22 0.99  CALCIUM 8.9 8.3*   Liver Function Tests: Recent Labs  Lab 03/02/19 0929  AST 32  ALT 13  ALKPHOS 64  BILITOT 0.6  PROT 9.1*  ALBUMIN 3.1*   No results for input(s): LIPASE, AMYLASE in the last 168 hours. No results for input(s): AMMONIA in the last 168 hours. CBC: Recent Labs  Lab 03/02/19 0928 03/03/19 0601 03/04/19 0321 03/04/19 1425 03/05/19 0344  WBC 11.4* 7.0 5.3  --  7.3  NEUTROABS 8.6*  --  3.1  --  4.5  HGB 9.4* 7.3* 6.1* 10.1* 9.5*  HCT 31.0* 25.0* 21.6* 33.5* 32.5*  MCV 82.4 86.8 89.6  --  88.8  PLT 138* 106* 100*  --  137*   Cardiac Enzymes: No results for input(s): CKTOTAL, CKMB, CKMBINDEX, TROPONINI in the last  168 hours. BNP: Invalid input(s): POCBNP CBG: No results for input(s): GLUCAP in the last 168 hours. D-Dimer No results  for input(s): DDIMER in the last 72 hours. Hgb A1c No results for input(s): HGBA1C in the last 72 hours. Lipid Profile No results for input(s): CHOL, HDL, LDLCALC, TRIG, CHOLHDL, LDLDIRECT in the last 72 hours. Thyroid function studies No results for input(s): TSH, T4TOTAL, T3FREE, THYROIDAB in the last 72 hours.  Invalid input(s): FREET3 Anemia work up No results for input(s): VITAMINB12, FOLATE, FERRITIN, TIBC, IRON, RETICCTPCT in the last 72 hours. Urinalysis    Component Value Date/Time   COLORURINE YELLOW 02/15/2019 0628   APPEARANCEUR CLEAR 02/15/2019 0628   LABSPEC 1.025 02/15/2019 0628   PHURINE 5.0 02/15/2019 0628   GLUCOSEU NEGATIVE 02/15/2019 0628   HGBUR NEGATIVE 02/15/2019 0628   BILIRUBINUR NEGATIVE 02/15/2019 0628   KETONESUR NEGATIVE 02/15/2019 0628   PROTEINUR NEGATIVE 02/15/2019 0628   NITRITE NEGATIVE 02/15/2019 0628   LEUKOCYTESUR NEGATIVE 02/15/2019 8563   Sepsis Labs Invalid input(s): PROCALCITONIN,  WBC,  LACTICIDVEN Microbiology Recent Results (from the past 240 hour(s))  Culture, Blood     Status: None (Preliminary result)   Collection Time: 03/02/19 11:30 AM  Result Value Ref Range Status   Specimen Description BLOOD SITE NOT SPECIFIED  Final   Special Requests   Final    BOTTLES DRAWN AEROBIC AND ANAEROBIC Blood Culture adequate volume   Culture   Final    NO GROWTH 3 DAYS Performed at [facility], [place] [address] [place], [place] [address]    Report Status PENDING  Incomplete  Culture, blood (routine x 2)     Status: None (Preliminary result)   Collection Time: 03/02/19  4:55 PM  Result Value Ref Range Status   Specimen Description   Final    BLOOD RIGHT ARM Performed at [facility], [place] [address] [place], [place] [address]    Special Requests   Final    BOTTLES DRAWN AEROBIC ONLY Blood Culture  adequate volume Performed at [place] [address] [place], [place] [address]    Culture   Final    NO GROWTH 3 DAYS Performed at [facility], [place] [address], [place], [place] [address]    Report Status PENDING  Incomplete  MRSA PCR Screening     Status: None   Collection Time: 03/02/19  5:02 PM  Result Value Ref Range Status   MRSA by PCR NEGATIVE NEGATIVE Final    Comment:        The GeneXpert MRSA Assay (FDA approved for NASAL specimens only), is one component of a comprehensive MRSA colonization surveillance program. It is not intended to diagnose MRSA infection nor to guide or monitor treatment for MRSA infections. Performed at [facility], [place] [address] [place], [place] [address]   Culture, blood (routine x 2)     Status: None (Preliminary result)   Collection Time: 03/02/19  5:04 PM  Result Value Ref Range Status   Specimen Description   Final    BLOOD RIGHT ARM Performed at [facility], [place] [address], [place], [place] [address]    Special Requests   Final    BOTTLES DRAWN AEROBIC ONLY Blood Culture adequate volume Performed at [place] [address], [place], [place] [address]    Culture   Final    NO GROWTH 3 DAYS Performed at [facility], [place] [address], [place], [place] [address]    Report Status PENDING  Incomplete  Culture, Urine     Status: None  Collection Time: 03/03/19 10:08 AM  Result Value Ref Range Status   Specimen Description   Final    URINE, CLEAN CATCH Performed at [facility], [place] [address], [place], [place] [address]    Special Requests   Final    NONE Performed at [facility], [place] [address], [place], [place] [address]    Culture   Final    Multiple bacterial morphotypes present, none predominant. Suggest appropriate recollection if clinically indicated.   Report Status 03/04/2019 FINAL  Final      Time coordinating discharge: 45 minutes  SIGNED:   Tawni Millers, MD  [facility] 03/05/2019, 9:23 AM

## 2019-03-06 NOTE — Progress Notes (Signed)
Pt discharged home in stable condition. Discharge instructions given. Pt verbalized understanding. No immediate questions or concerns at this time. Pt discharged from unit via wheelchair.  

## 2019-03-06 NOTE — Progress Notes (Signed)
PMT progress note  Patient seen this am, resting in bed. No family at bedside.   The patient is more alert this am, he did have some breakfast. His LUE is in a cast, he denies pain, denies shortness of breath.   Chart reviewed and overnight events noted.   BP 113/86 (BP Location: Right Arm)   Pulse 74   Temp 98.4 F (36.9 C) (Oral)   Resp 16   Ht 5\' 9"  (1.753 m)   Wt 45.4 kg   SpO2 99%   BMI 14.78 kg/m  Labs and imaging noted.  Appreciate hospice follow up.   Resting in bed Regular pattern of breathing Appears weak  awake alert  Discussed with patient this morning, he anticipates going home with hospice support today, pending delivery of medical equipment to his home.    Anticipate home with hospice discharge soon.  No new PMT specific recommendations at this time.  15 minutes spent Loistine Chance MD [facility] health palliative medicine team [phone].

## 2019-03-06 NOTE — Care Management Note (Signed)
Case Management Note  Patient Details  Name: MARUICE PIERONI MRN: [id number] Date of Birth: Jan 15, 1961  PCS services formed faxed to [facility] [phone])  Lynnell Catalan, RN 03/06/2019, 1:37 PM

## 2019-03-06 NOTE — Progress Notes (Signed)
PROGRESS NOTE                                                                                                                                                                                                             Patient Demographics:    Jeff [name], is a 58 y.o. male, [name] - 1961-08-30, TOI:[id number]  Admit date - 03/02/2019   Admitting Physician Ankit Arsenio Loader, MD  Outpatient Primary MD for the patient is Patient, [name]  LOS - 4  Outpatient Specialists: Dr [name]  No chief complaint on file.      Brief Narrative  58 year old male with metastatic colon cancer, history of PE, bilateral lower extremity DVT status post IVC filter and recent left wrist fracture presenting with generalized weakness, failure to thrive with poor appetite.  Patient seen in the oncology office and found to be hypotensive and significantly deconditioned.  Patient transferred to hospital for IV hydration for hypovolemia and acute kidney injury.   Subjective:   Patient reports feeling tired and wanting to go home.  Tolerating diet.  Assessment  & Plan :    Principal Problem: Hypotension secondary to hypovolemia Improved with IV fluids.  Has severe deconditioning with advanced malignancy.  Acute kidney injury Resolved with IV fluids.  Active Problems: Metastatic colon cancer Overall prognosis is guarded.  No plan for further chemotherapy or aggressive treatment.  Seen by oncology and palliative care and goal is for returning home with home hospice for comfort measures.  Continue scheduled MS Contin and oxycodone as needed for pain.      Protein-calorie malnutrition, severe Continue nutritional supplement.   Primary colon cancer with metastasis to other site [facility])   History of deep vein thrombosis (DVT) of lower extremity Status post IVC filter.  Continue Xarelto.  Left wrist fracture Has a cast in place.  Continue pain  meds.  Acute on chronic anemia/iron deficiency anemia Continue iron supplement.  Received PRBC while in the hospital.  Chronic depression Continue Remeron.     Code Status : DNR  Family Communication  : Discussed with daughter on the phone.  Disposition Plan  : Home with home hospice.  Hospice equipment being delivered this afternoon as per his daughter    Consults  : Oncology/palliative care   DVT Prophylaxis  : On Xarelto  Lab Results  Component Value Date   PLT 137 (L) 03/05/2019    Antibiotics  :    Anti-infectives (From admission, onward)   None        Objective:   Vitals:   03/05/19 1500 03/05/19 1647 03/05/19 2207 03/06/19 0618  BP: 100/63 109/74 100/68 113/86  Pulse:  82 81 74  Resp:  16 20 16   Temp:  98.4 F (36.9 C) 99.2 F (37.3 C) 98.4 F (36.9 C)  TempSrc:  Oral Oral Oral  SpO2: 100% 96% 97% 99%  Weight:      Height:        Wt Readings from Last 3 Encounters:  03/02/19 45.4 kg  03/02/19 46.3 kg  02/15/19 56.7 kg     Intake/Output Summary (Last 24 hours) at 03/06/2019 1146 Last data filed at 03/06/2019 1100 Gross per 24 hour  Intake 2161.16 ml  Output 1225 ml  Net 936.16 ml     Physical Exam  Gen: not in distress, fatigued HEENT: Pallor present, temporal wasting, moist mucosa, supple neck Chest: clear b/l, no added sounds CVS: N S1&S2, no murmurs, GI: soft, NT, ND, BS+, colostomy with output Musculoskeletal: warm, no edema, left wrist cast    Data Review:    CBC Recent Labs  Lab 03/02/19 0928 03/03/19 0601 03/04/19 0321 03/04/19 1425 03/05/19 0344  WBC 11.4* 7.0 5.3  --  7.3  HGB 9.4* 7.3* 6.1* 10.1* 9.5*  HCT 31.0* 25.0* 21.6* 33.5* 32.5*  PLT 138* 106* 100*  --  137*  MCV 82.4 86.8 89.6  --  88.8  MCH 25.0* 25.3* 25.3*  --  26.0  MCHC 30.3 29.2* 28.2*  --  29.2*  RDW 19.7* 19.9* 20.4*  --  18.7*  LYMPHSABS 1.8  --  1.5  --  1.7  MONOABS 0.9  --  0.6  --  0.9  EOSABS 0.0  --  0.1  --  0.1  BASOSABS 0.0  --   0.0  --  0.1    Chemistries  Recent Labs  Lab 03/02/19 0929 03/03/19 0601  NA 132* 137  K 3.7 3.7  CL 98 109  CO2 23 23  GLUCOSE 142* 100*  BUN 33* 23*  CREATININE 1.22 0.99  CALCIUM 8.9 8.3*  AST 32  --   ALT 13  --   ALKPHOS 64  --   BILITOT 0.6  --    ------------------------------------------------------------------------------------------------------------------ No results for input(s): CHOL, HDL, LDLCALC, TRIG, CHOLHDL, LDLDIRECT in the last 72 hours.  No results found for: HGBA1C ------------------------------------------------------------------------------------------------------------------ No results for input(s): TSH, T4TOTAL, T3FREE, THYROIDAB in the last 72 hours.  Invalid input(s): FREET3 ------------------------------------------------------------------------------------------------------------------ No results for input(s): VITAMINB12, FOLATE, FERRITIN, TIBC, IRON, RETICCTPCT in the last 72 hours.  Coagulation profile No results for input(s): INR, PROTIME in the last 168 hours.  No results for input(s): DDIMER in the last 72 hours.  Cardiac Enzymes No results for input(s): CKMB, TROPONINI, MYOGLOBIN in the last 168 hours.  Invalid input(s): CK ------------------------------------------------------------------------------------------------------------------ No results found for: BNP  Inpatient Medications  Scheduled Meds: . sodium chloride   Intravenous Once  . Chlorhexidine Gluconate Cloth  6 each Topical Daily  . ferrous sulfate  325 mg Oral BID WC  . gabapentin  300 mg Oral TID  . magnesium oxide  400 mg Oral Daily  . megestrol  400 mg Oral Daily  . mirtazapine  15 mg Oral QHS  . morphine  30 mg Oral Q12H  . multivitamin with minerals  1 tablet  Oral Daily  . pantoprazole  40 mg Oral Daily  . potassium chloride  20 mEq Oral Daily  . rivaroxaban  20 mg Oral Q breakfast   Continuous Infusions: . sodium chloride 100 mL/hr at 03/05/19 1500    PRN Meds:.diphenoxylate-atropine, oxyCODONE, prochlorperazine, sodium chloride flush  Micro Results Recent Results (from the past 240 hour(s))  Culture, Blood     Status: None (Preliminary result)   Collection Time: 03/02/19 11:30 AM  Result Value Ref Range Status   Specimen Description BLOOD SITE NOT SPECIFIED  Final   Special Requests   Final    BOTTLES DRAWN AEROBIC AND ANAEROBIC Blood Culture adequate volume   Culture   Final    NO GROWTH 4 DAYS Performed at [facility], [place] [address], [place], [place] [address]    Report Status PENDING  Incomplete  Culture, blood (routine x 2)     Status: None (Preliminary result)   Collection Time: 03/02/19  4:55 PM  Result Value Ref Range Status   Specimen Description   Final    BLOOD RIGHT ARM Performed at [facility], [name] [address] [place], [place] [address]    Special Requests   Final    BOTTLES DRAWN AEROBIC ONLY Blood Culture adequate volume Performed at [place] [address], [place], [place] [address]    Culture   Final    NO GROWTH 4 DAYS Performed at [facility], [place] [address], [place], [place] [address]    Report Status PENDING  Incomplete  MRSA PCR Screening     Status: None   Collection Time: 03/02/19  5:02 PM  Result Value Ref Range Status   MRSA by PCR NEGATIVE NEGATIVE Final    Comment:        The GeneXpert MRSA Assay (FDA approved for NASAL specimens only), is one component of a comprehensive MRSA colonization surveillance program. It is not intended to diagnose MRSA infection nor to guide or monitor treatment for MRSA infections. Performed at [facility], [place] [address] [place], [place] [address]   Culture, blood (routine x 2)     Status: None (Preliminary result)   Collection Time: 03/02/19  5:04 PM  Result Value Ref Range Status   Specimen Description   Final    BLOOD RIGHT ARM Performed at [facility], [place] [address],  [place], [place] [address]    Special Requests   Final    BOTTLES DRAWN AEROBIC ONLY Blood Culture adequate volume Performed at [place] [address], [place], [place] [address]    Culture   Final    NO GROWTH 4 DAYS Performed at [facility], [place] [address], [place], [place] [address]    Report Status PENDING  Incomplete  Culture, Urine     Status: None   Collection Time: 03/03/19 10:08 AM  Result Value Ref Range Status   Specimen Description   Final    URINE, CLEAN CATCH Performed at [facility], [place] [address] [place], [place] [address]    Special Requests   Final    NONE Performed at [facility], [place] [address] [place],  [address]    Culture   Final    Multiple bacterial morphotypes present, none predominant. Suggest appropriate recollection if clinically indicated.   Report Status 03/04/2019 FINAL  Final    Radiology Reports Dg Chest 2 View  Result Date: 02/16/2019  CLINICAL DATA:  Colon cancer. Weakness. EXAM: CHEST - 2 VIEW COMPARISON:  CT chest 12/19/2018. Chest radiograph 01/08/2018. FINDINGS: Unchanged Port-A-Cath. Normal heart size. No consolidation, edema, or pulmonary nodules. No visible free air. Apparent severe malnutrition. No acute osseous findings. IMPRESSION: 1. No active cardiopulmonary disease. Electronically Signed   By: Staci Righter M.D.   On: 02/16/2019 10:15   Ct Abdomen Pelvis W Contrast  Result Date: 02/15/2019 CLINICAL DATA:  Abdominal pain.  History of colon cancer. EXAM: CT ABDOMEN AND PELVIS WITH CONTRAST TECHNIQUE: Multidetector CT imaging of the abdomen and pelvis was performed using the standard protocol following bolus administration of intravenous contrast. CONTRAST:  135mL ISOVUE-300 IOPAMIDOL (ISOVUE-300) INJECTION 61% COMPARISON:  01/04/2019 CT abdomen pelvis FINDINGS: Lower chest: Lung bases are clear. Hepatobiliary: Normal patent contours. No biliary dilatation. The  gallbladder is normal. No focal liver lesion. Pancreas: Hypoattenuating focus at the tail of the pancreas is unchanged, likely invading the pancreatic parenchyma. No dilatation of the pancreatic duct. Spleen: Low-density mass at the inferior splenic hilum is unchanged. Adrenals/Urinary Tract: The adrenal glands are normal. Both kidneys are normal. Urinary bladder is distended. Stomach/Bowel: Right lower quadrant dual lumen colostomy without evidence of obstruction. Edematous appearance on the prior scan has improved. Soft tissue mass at the splenic flexure, invading the splenic hilum and pancreatic tail is unchanged, measuring approximately 5.1 cm. Small foci of adjacent gas may be extraluminal (coronal image 51, axial image 22). No dilated small bowel. Vascular/Lymphatic: There is aortic atherosclerotic calcification. Extensive anterior abdominal collaterals are again demonstrated, in the context of chronic occlusion of the splenic vein. There is a stent within the left iliac vein without opacification. Soft tissue density surrounding the superior mesenteric artery is unchanged. Reproductive: Prostate is unremarkable. Other: None Musculoskeletal: No acute or significant osseous findings. IMPRESSION: 1. Unchanged size of colonic mass at the splenic flexure with extraluminal extension and invasion of the splenic hilum and pancreatic tail. Multiple nearby punctate foci of gas may be extraluminal, which would suggest microperforation. No large volume pneumoperitoneum. 2. Right lower quadrant transverse colostomy with decreased edema of the efferent limb. 3. 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 4. Soft tissue mass along the anterior aspect of the proximal superior mesenteric artery, unchanged. Electronically Signed   By: Ulyses Jarred M.D.   On: 02/15/2019 06:07    Time Spent in minutes  25   Barney Russomanno M.D on 03/06/2019 at  11:46 AM  Between 7am to 7pm - Pager - [phone]  After 7pm go to [email or web address] - password [facility]  [facility] -  Office  [phone]

## 2019-03-06 NOTE — Progress Notes (Signed)
Jeff [name]   [name]:12/26/1961   MI#:[id number]   MGN#:[id number]  Oncology follow up  Subjective: Patient looks better today.  He states he has been eating better, feels better.  His pain is controlled, no other concerns.  Plan is to go home with hospice today.   Objective:  Vitals:   03/05/19 2207 03/06/19 0618  BP: 100/68 113/86  Pulse: 81 74  Resp: 20 16  Temp: 99.2 F (37.3 C) 98.4 F (36.9 C)  SpO2: 97% 99%    Body mass index is 14.78 kg/m.  Intake/Output Summary (Last 24 hours) at 03/06/2019 1232 Last data filed at 03/06/2019 1100 Gross per 24 hour  Intake 2161.16 ml  Output 1225 ml  Net 936.16 ml     Sclerae unicteric, cachectic appearing  Oropharynx clear  No peripheral adenopathy  Lungs clear -- no rales or rhonchi  Heart regular rate and rhythm  Abdomen soft, (+) tenderness at LUQ   MSK no focal spinal tenderness, no peripheral edema  Neuro nonfocal    CBG (last 3)  No results for input(s): GLUCAP in the last 72 hours.   Labs:  Lab Results  Component Value Date   WBC 7.3 03/05/2019   HGB 9.5 (L) 03/05/2019   HCT 32.5 (L) 03/05/2019   MCV 88.8 03/05/2019   PLT 137 (L) 03/05/2019   NEUTROABS 4.5 03/05/2019   CMP Latest Ref Rng & Units 03/03/2019 03/02/2019 02/20/2019  Glucose 70 - 99 mg/dL 100(H) 142(H) 100(H)  BUN 6 - 20 mg/dL 23(H) 33(H) <5(L)  Creatinine 0.61 - 1.24 mg/dL 0.99 1.22 0.77  Sodium 135 - 145 mmol/L 137 132(L) 135  Potassium 3.5 - 5.1 mmol/L 3.7 3.7 4.0  Chloride 98 - 111 mmol/L 109 98 106  CO2 22 - 32 mmol/L 23 23 24   Calcium 8.9 - 10.3 mg/dL 8.3(L) 8.9 8.2(L)  Total Protein 6.5 - 8.1 g/dL - 9.1(H) -  Total Bilirubin 0.3 - 1.2 mg/dL - 0.6 -  Alkaline Phos 38 - 126 U/L - 64 -  AST 15 - 41 U/L - 32 -  ALT 0 - 44 U/L - 13 -     Urine Studies No results for input(s): UHGB, CRYS in the last 72 hours.  Invalid input(s): UACOL, UAPR, USPG, UPH, UTP, UGL, UKET, UBIL, UNIT, UROB, ULEU, UEPI, UWBC, URBC, UBAC, CAST, UCOM, BILUA  Basic  Metabolic Panel: Recent Labs  Lab 03/02/19 0929 03/03/19 0601  NA 132* 137  K 3.7 3.7  CL 98 109  CO2 23 23  GLUCOSE 142* 100*  BUN 33* 23*  CREATININE 1.22 0.99  CALCIUM 8.9 8.3*   GFR Estimated Creatinine Clearance: 51.6 mL/min (by C-G formula based on SCr of 0.99 mg/dL). Liver Function Tests: Recent Labs  Lab 03/02/19 0929  AST 32  ALT 13  ALKPHOS 64  BILITOT 0.6  PROT 9.1*  ALBUMIN 3.1*   No results for input(s): LIPASE, AMYLASE in the last 168 hours. No results for input(s): AMMONIA in the last 168 hours. Coagulation profile No results for input(s): INR, PROTIME in the last 168 hours.  CBC: Recent Labs  Lab 03/02/19 0928 03/03/19 0601 03/04/19 0321 03/04/19 1425 03/05/19 0344  WBC 11.4* 7.0 5.3  --  7.3  NEUTROABS 8.6*  --  3.1  --  4.5  HGB 9.4* 7.3* 6.1* 10.1* 9.5*  HCT 31.0* 25.0* 21.6* 33.5* 32.5*  MCV 82.4 86.8 89.6  --  88.8  PLT 138* 106* 100*  --  137*   Cardiac  Enzymes: No results for input(s): CKTOTAL, CKMB, CKMBINDEX, TROPONINI in the last 168 hours. BNP: Invalid input(s): POCBNP CBG: No results for input(s): GLUCAP in the last 168 hours. D-Dimer No results for input(s): DDIMER in the last 72 hours. Hgb A1c No results for input(s): HGBA1C in the last 72 hours. Lipid Profile No results for input(s): CHOL, HDL, LDLCALC, TRIG, CHOLHDL, LDLDIRECT in the last 72 hours. Thyroid function studies No results for input(s): TSH, T4TOTAL, T3FREE, THYROIDAB in the last 72 hours.  Invalid input(s): FREET3 Anemia work up No results for input(s): VITAMINB12, FOLATE, FERRITIN, TIBC, IRON, RETICCTPCT in the last 72 hours. Microbiology Recent Results (from the past 240 hour(s))  Culture, Blood     Status: None (Preliminary result)   Collection Time: 03/02/19 11:30 AM  Result Value Ref Range Status   Specimen Description BLOOD SITE NOT SPECIFIED  Final   Special Requests   Final    BOTTLES DRAWN AEROBIC AND ANAEROBIC Blood Culture adequate volume    Culture   Final    NO GROWTH 4 DAYS Performed at [facility], [address] [place], [place] [address]    Report Status PENDING  Incomplete  Culture, blood (routine x 2)     Status: None (Preliminary result)   Collection Time: 03/02/19  4:55 PM  Result Value Ref Range Status   Specimen Description   Final    BLOOD RIGHT ARM Performed at [facility], [place] [address], [place], [place] [address]    Special Requests   Final    BOTTLES DRAWN AEROBIC ONLY Blood Culture adequate volume Performed at [place] [address] [place], [place] [address]    Culture   Final    NO GROWTH 4 DAYS Performed at [facility], [place] [address], [place], [place] [address]    Report Status PENDING  Incomplete  MRSA PCR Screening     Status: None   Collection Time: 03/02/19  5:02 PM  Result Value Ref Range Status   MRSA by PCR NEGATIVE NEGATIVE Final    Comment:        The GeneXpert MRSA Assay (FDA approved for NASAL specimens only), is one component of a comprehensive MRSA colonization surveillance program. It is not intended to diagnose MRSA infection nor to guide or monitor treatment for MRSA infections. Performed at [facility], [place] [address] [place], [place] [address]   Culture, blood (routine x 2)     Status: None (Preliminary result)   Collection Time: 03/02/19  5:04 PM  Result Value Ref Range Status   Specimen Description   Final    BLOOD RIGHT ARM Performed at [facility], [place] [address] [place], [place] [address]    Special Requests   Final    BOTTLES DRAWN AEROBIC ONLY Blood Culture adequate volume Performed at [place] [address] [place], [place] [address]    Culture   Final    NO GROWTH 4 DAYS Performed at [facility], [place] [address], [place], [place] [address]    Report Status PENDING  Incomplete  Culture, Urine     Status: None   Collection Time: 03/03/19 10:08 AM   Result Value Ref Range Status   Specimen Description   Final    URINE, CLEAN CATCH Performed at [facility], [place] [address] [place],  [address]    Special Requests   Final    NONE  Performed at [facility], [place] [address] [place], [place] [address]    Culture   Final    Multiple bacterial morphotypes present, none predominant. Suggest appropriate recollection if clinically indicated.   Report Status 03/04/2019 FINAL  Final      Studies:  No results found.  Assessment: 58 y.o. male with metastatic colon cancer, s/p multiple line therapy   1.  Failure to thrive at home, secondary to metastatic cancer 2.  Metastatic colon cancer, last chemotherapy on 1/30. Chemo held due to his recent left finger fracture and hospitalization 3.  Progressive weakness, with poor oral intake, improved  4.  Severe protein and calorie malnutrition, weight loss  5. Hypotension secondary to dehydration, resolved  6. Anemia secondary to chemotherapy and metastatic malignancy, s/p blood transfusion  7. Code status: DNR   Plan:  -Pt overall improved some after IVF and blood transfusion, eats better, still very weak -The plan is go home with home hospice, equipment will be delivered today -I will remain to be his MD when he is under hospice care, I will see him as needed in my clinic.  Patient knows to call me if he has any concerns.   [name], MD 03/06/2019  12:32 PM

## 2019-03-07 LAB — CULTURE, BLOOD (ROUTINE X 2)
CULTURE: NO GROWTH
Culture: NO GROWTH
Special Requests: ADEQUATE
Special Requests: ADEQUATE

## 2019-03-07 LAB — CULTURE, BLOOD (SINGLE)
CULTURE: NO GROWTH
Special Requests: ADEQUATE

## 2019-03-08 ENCOUNTER — Telehealth: Payer: Self-pay

## 2019-03-08 NOTE — Telephone Encounter (Signed)
Faxed signed orders back to Hospice, sent to HIM for scanning to chart.  

## 2019-03-09 ENCOUNTER — Other Ambulatory Visit: Payer: Self-pay

## 2019-03-09 ENCOUNTER — Ambulatory Visit: Payer: Self-pay | Admitting: Hematology

## 2019-03-09 ENCOUNTER — Ambulatory Visit: Payer: Self-pay

## 2019-03-20 ENCOUNTER — Other Ambulatory Visit: Payer: Self-pay | Admitting: Hematology

## 2019-03-20 ENCOUNTER — Other Ambulatory Visit: Payer: Self-pay

## 2019-03-20 DIAGNOSIS — C186 Malignant neoplasm of descending colon: Secondary | ICD-10-CM

## 2019-03-20 DIAGNOSIS — C189 Malignant neoplasm of colon, unspecified: Secondary | ICD-10-CM

## 2019-03-20 DIAGNOSIS — E876 Hypokalemia: Secondary | ICD-10-CM

## 2019-03-20 MED ORDER — RIVAROXABAN 20 MG PO TABS: 20.0000 mg | ORAL_TABLET | Freq: Every day | ORAL | 0 refills | Status: DC

## 2019-03-20 MED ORDER — PANTOPRAZOLE SODIUM 40 MG PO TBEC: 40.0000 mg | DELAYED_RELEASE_TABLET | Freq: Every day | ORAL | 1 refills | Status: DC

## 2019-03-20 MED ORDER — OXYCODONE HCL 10 MG PO TABS: 10.0000 mg | ORAL_TABLET | Freq: Four times a day (QID) | ORAL | 0 refills | Status: DC

## 2019-03-20 MED ORDER — ONDANSETRON HCL 8 MG PO TABS: 8.0000 mg | ORAL_TABLET | Freq: Two times a day (BID) | ORAL | 3 refills | Status: AC | PRN

## 2019-03-20 MED ORDER — DIPHENOXYLATE-ATROPINE 2.5-0.025 MG PO TABS: 2.0000 | ORAL_TABLET | Freq: Four times a day (QID) | ORAL | 1 refills | Status: DC | PRN

## 2019-03-20 MED ORDER — POTASSIUM CHLORIDE CRYS ER 20 MEQ PO TBCR: 20.0000 meq | EXTENDED_RELEASE_TABLET | Freq: Every day | ORAL | 2 refills | Status: DC

## 2019-03-20 MED ORDER — MORPHINE SULFATE ER 30 MG PO TBCR: 30.0000 mg | EXTENDED_RELEASE_TABLET | Freq: Two times a day (BID) | ORAL | 0 refills | Status: DC

## 2019-03-20 MED FILL — ONDANSETRON HCL 8 MG TABLET: 8 | 15 days supply | Qty: 30 | Fill #0

## 2019-03-20 MED FILL — PANTOPRAZOLE SOD DR 40 MG T: 40 | 30 days supply | Qty: 30 | Fill #0

## 2019-03-20 MED FILL — MORPHINE SULF ER 30 MG TAB: 30 | 30 days supply | Qty: 60 | Fill #0

## 2019-03-20 MED FILL — oxyCODONE HCL 10 MG TABS: 10 | 30 days supply | Qty: 120 | Fill #0

## 2019-03-20 MED FILL — POTASSIUM CHLORIDE CRYS ER: 20 | 30 days supply | Qty: 30 | Fill #0

## 2019-03-21 MED FILL — DIPHENOXYLATE-ATROPINE 2.5-: 2.5-0.025 | 12 days supply | Qty: 90 | Fill #0

## 2019-03-23 ENCOUNTER — Other Ambulatory Visit: Payer: Self-pay

## 2019-03-23 ENCOUNTER — Telehealth: Payer: Self-pay

## 2019-03-23 ENCOUNTER — Ambulatory Visit: Payer: Self-pay | Admitting: Hematology

## 2019-03-23 ENCOUNTER — Telehealth: Payer: Self-pay | Admitting: *Deleted

## 2019-03-23 ENCOUNTER — Ambulatory Visit: Payer: Self-pay

## 2019-03-23 NOTE — Telephone Encounter (Signed)
Hospice RN called stating patient has revoked his Hospice benefits as of this afternoon, scheduling message has been sent to see patient back in clinic.

## 2019-03-23 NOTE — Telephone Encounter (Signed)
Scheduling message was sent to see this patient for lab, port flush and f/u in 2 to 4 weeks per [name].

## 2019-03-23 NOTE — Telephone Encounter (Signed)
Hospice visit today to admit to services. Hospice will not pay for his Xarelto. Patient/family will decline Hospice if need to take the Xarelto. Can this be safely discontinued? Please call as soon as MD responds.

## 2019-03-24 ENCOUNTER — Telehealth: Payer: Self-pay | Admitting: Hematology

## 2019-03-24 NOTE — Telephone Encounter (Signed)
Scheduled appt per sch message. - pt is aware of appt date and time

## 2019-03-28 ENCOUNTER — Telehealth: Payer: Self-pay | Admitting: General Practice

## 2019-03-28 ENCOUNTER — Other Ambulatory Visit: Payer: Self-pay | Admitting: Hematology

## 2019-03-28 MED FILL — XARELTO 20 MG TABLET: 20 | 30 days supply | Qty: 30 | Fill #0

## 2019-03-28 NOTE — Telephone Encounter (Signed)
[place] CSW Progress Notes  Call from daughter re concerns w medications - hopes that issue w Medicaid and pharmacy coverage.  Is communicating w [facility] to try and address need for medications that were not provided under Hospice care - patient revoked Hospice services last week and insurance has not reset.  Per daughter, she has call [facility] and has been given number to call at [facility] to try and resolve issue.  Is also in communication w pharmacy to get small emergency supply of needed meds.    Edwyna Shell, LCSW Clinical Social Worker Phone:  [phone]

## 2019-03-29 MED FILL — MIRTAZAPINE 15 MG TABLET: 15 | 30 days supply | Qty: 30 | Fill #0

## 2019-04-03 ENCOUNTER — Other Ambulatory Visit: Payer: Self-pay | Admitting: Medical

## 2019-04-03 DIAGNOSIS — K589 Irritable bowel syndrome without diarrhea: Secondary | ICD-10-CM

## 2019-04-03 NOTE — Telephone Encounter (Signed)
Please advise refill? 

## 2019-04-05 ENCOUNTER — Telehealth: Payer: Self-pay

## 2019-04-05 NOTE — Telephone Encounter (Signed)
Patient's daughter call stating that he is complaining of discomfort around his stoma, denies any unusual drainage, no fevers, he does not want to come into be seen.  I explained it is something that we would have to look at.  He already has a scheduled appointment for Friday to for lab/flush/f/u with [name].  He will keep this appointment and call back if his symptoms worsen.

## 2019-04-06 NOTE — Progress Notes (Signed)
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 prescription -Lab, flush and chemo irinotecan and vectibix in 1, 3 and 5 weeks -F/u in 3, and 5 weeks  -CT chest, abdomen with contrast in 1-2 weeks    No  problem-specific Assessment & Plan notes found for this encounter.   Orders Placed This Encounter  Procedures  . CT Abdomen Pelvis W Contrast    Standing Status:   Future    Standing Expiration Date:   04/07/2020    Order Specific Question:   If indicated for the ordered procedure, I authorize the administration of contrast media per Radiology protocol    Answer:   Yes    Order Specific Question:   Preferred imaging location?    Answer:   [facility]    Order Specific Question:   Is Oral Contrast requested for this exam?    Answer:   Yes, Per Radiology protocol    Order Specific Question:   Radiology Contrast Protocol - do NOT remove file path    Answer:   \\[email or web address]  . CT Chest W Contrast    Standing Status:   Future    Standing Expiration Date:   04/07/2020    Order Specific Question:   If indicated for  the ordered procedure, I authorize the administration of contrast media per Radiology protocol    Answer:   Yes    Order Specific Question:   Preferred imaging location?    Answer:   [facility]    Order Specific Question:   Radiology Contrast Protocol - do NOT remove file path    Answer:   \\[email or web address]   All questions were answered. The patient knows to call the clinic with any problems, questions or concerns. No barriers to learning was detected. I spent 25 minutes counseling the patient face to face. The total time spent in the appointment was 30 minutes and more than 50% was on counseling and review of test results     [name], MD 04/07/2019   I, Joslyn Devon, am acting as scribe for [name], MD.   I have reviewed the above documentation for accuracy and completeness, and I agree with the above.

## 2019-04-07 ENCOUNTER — Telehealth: Payer: Self-pay | Admitting: Hematology

## 2019-04-07 ENCOUNTER — Inpatient Hospital Stay: Payer: Medicaid Other

## 2019-04-07 ENCOUNTER — Other Ambulatory Visit: Payer: Self-pay

## 2019-04-07 ENCOUNTER — Inpatient Hospital Stay: Payer: Medicaid Other | Attending: Hematology | Admitting: Hematology

## 2019-04-07 VITALS — BP 109/82 | HR 94 | Temp 97.9°F | Resp 18 | Ht 69.0 in | Wt 113.2 lb

## 2019-04-07 DIAGNOSIS — C786 Secondary malignant neoplasm of retroperitoneum and peritoneum: Secondary | ICD-10-CM | POA: Diagnosis not present

## 2019-04-07 DIAGNOSIS — R197 Diarrhea, unspecified: Secondary | ICD-10-CM | POA: Diagnosis not present

## 2019-04-07 DIAGNOSIS — Z7901 Long term (current) use of anticoagulants: Secondary | ICD-10-CM | POA: Insufficient documentation

## 2019-04-07 DIAGNOSIS — Z5111 Encounter for antineoplastic chemotherapy: Secondary | ICD-10-CM | POA: Diagnosis present

## 2019-04-07 DIAGNOSIS — C186 Malignant neoplasm of descending colon: Secondary | ICD-10-CM | POA: Diagnosis not present

## 2019-04-07 DIAGNOSIS — C189 Malignant neoplasm of colon, unspecified: Secondary | ICD-10-CM

## 2019-04-07 DIAGNOSIS — Z9221 Personal history of antineoplastic chemotherapy: Secondary | ICD-10-CM | POA: Insufficient documentation

## 2019-04-07 DIAGNOSIS — D638 Anemia in other chronic diseases classified elsewhere: Secondary | ICD-10-CM | POA: Diagnosis not present

## 2019-04-07 DIAGNOSIS — D630 Anemia in neoplastic disease: Secondary | ICD-10-CM

## 2019-04-07 DIAGNOSIS — Z79899 Other long term (current) drug therapy: Secondary | ICD-10-CM | POA: Insufficient documentation

## 2019-04-07 DIAGNOSIS — Z86718 Personal history of other venous thrombosis and embolism: Secondary | ICD-10-CM | POA: Diagnosis not present

## 2019-04-07 DIAGNOSIS — D509 Iron deficiency anemia, unspecified: Secondary | ICD-10-CM

## 2019-04-07 DIAGNOSIS — C762 Malignant neoplasm of abdomen: Secondary | ICD-10-CM

## 2019-04-07 DIAGNOSIS — Z66 Do not resuscitate: Secondary | ICD-10-CM | POA: Insufficient documentation

## 2019-04-07 DIAGNOSIS — K56609 Unspecified intestinal obstruction, unspecified as to partial versus complete obstruction: Secondary | ICD-10-CM | POA: Diagnosis not present

## 2019-04-07 LAB — CBC WITH DIFFERENTIAL (CANCER CENTER ONLY)
Abs Immature Granulocytes: 0.02 10*3/uL (ref 0.00–0.07)
Basophils Absolute: 0.1 10*3/uL (ref 0.0–0.1)
Basophils Relative: 1 %
Eosinophils Absolute: 0.2 10*3/uL (ref 0.0–0.5)
Eosinophils Relative: 3 %
HCT: 35 % — ABNORMAL LOW (ref 39.0–52.0)
Hemoglobin: 10.7 g/dL — ABNORMAL LOW (ref 13.0–17.0)
Immature Granulocytes: 0 %
Lymphocytes Relative: 20 %
Lymphs Abs: 1.8 10*3/uL (ref 0.7–4.0)
MCH: 27.7 pg (ref 26.0–34.0)
MCHC: 30.6 g/dL (ref 30.0–36.0)
MCV: 90.7 fL (ref 80.0–100.0)
Monocytes Absolute: 0.6 10*3/uL (ref 0.1–1.0)
Monocytes Relative: 7 %
Neutro Abs: 6.1 10*3/uL (ref 1.7–7.7)
Neutrophils Relative %: 69 %
Platelet Count: 176 10*3/uL (ref 150–400)
RBC: 3.86 MIL/uL — ABNORMAL LOW (ref 4.22–5.81)
RDW: 17.2 % — ABNORMAL HIGH (ref 11.5–15.5)
WBC Count: 8.8 10*3/uL (ref 4.0–10.5)
nRBC: 0 % (ref 0.0–0.2)

## 2019-04-07 LAB — CMP (CANCER CENTER ONLY)
ALT: <6 U/L (ref 0–44)
AST: 18 U/L (ref 15–41)
Albumin: 2.9 g/dL — ABNORMAL LOW (ref 3.5–5.0)
Alkaline Phosphatase: 85 U/L (ref 38–126)
Anion gap: 11 (ref 5–15)
BUN: 6 mg/dL (ref 6–20)
CO2: 23 mmol/L (ref 22–32)
Calcium: 9.1 mg/dL (ref 8.9–10.3)
Chloride: 103 mmol/L (ref 98–111)
Creatinine: 0.82 mg/dL (ref 0.61–1.24)
GFR, Est AFR Am: >60 mL/min (ref >60)
GFR, Estimated: >60 mL/min (ref >60)
Glucose, Bld: 130 mg/dL — ABNORMAL HIGH (ref 70–99)
Potassium: 4 mmol/L (ref 3.5–5.1)
Sodium: 137 mmol/L (ref 135–145)
Total Bilirubin: 0.4 mg/dL (ref 0.3–1.2)
Total Protein: 8.4 g/dL — ABNORMAL HIGH (ref 6.5–8.1)

## 2019-04-07 LAB — IRON AND TIBC
Iron: 48 ug/dL (ref 42–163)
Saturation Ratios: 27 % (ref 20–55)
TIBC: 176 ug/dL — ABNORMAL LOW (ref 202–409)
UIBC: 128 ug/dL (ref 117–376)

## 2019-04-07 LAB — FERRITIN: Ferritin: 702 ng/mL — ABNORMAL HIGH (ref 24–336)

## 2019-04-07 LAB — CEA (IN HOUSE-CHCC): CEA (CHCC-In House): 32.7 ng/mL — ABNORMAL HIGH (ref 0.00–5.00)

## 2019-04-07 MED ORDER — OXYCODONE HCL 10 MG PO TABS: 10.0000 mg | ORAL_TABLET | ORAL | 0 refills | Status: DC | PRN

## 2019-04-07 MED FILL — oxyCODONE HCL 10 MG TABS: 10 | 20 days supply | Qty: 120 | Fill #0

## 2019-04-07 NOTE — Telephone Encounter (Signed)
Scheduled appt per 4/10 los.  Patient daughter aware of appts.

## 2019-04-08 ENCOUNTER — Encounter: Payer: Self-pay | Admitting: Hematology

## 2019-04-08 MED ORDER — RIVAROXABAN 15 MG PO TABS: 15.0000 mg | ORAL_TABLET | Freq: Two times a day (BID) | ORAL | 3 refills | Status: DC

## 2019-04-08 MED ORDER — RIVAROXABAN 15 MG PO TABS: 15.0000 mg | ORAL_TABLET | Freq: Every day | ORAL | 3 refills | Status: DC

## 2019-04-14 ENCOUNTER — Other Ambulatory Visit: Payer: Self-pay

## 2019-04-14 ENCOUNTER — Other Ambulatory Visit: Payer: Self-pay | Admitting: Hematology

## 2019-04-14 ENCOUNTER — Inpatient Hospital Stay: Payer: Medicaid Other

## 2019-04-14 VITALS — BP 113/83 | HR 97 | Temp 98.4°F | Resp 18

## 2019-04-14 DIAGNOSIS — D509 Iron deficiency anemia, unspecified: Secondary | ICD-10-CM

## 2019-04-14 DIAGNOSIS — C189 Malignant neoplasm of colon, unspecified: Secondary | ICD-10-CM

## 2019-04-14 DIAGNOSIS — D630 Anemia in neoplastic disease: Secondary | ICD-10-CM

## 2019-04-14 DIAGNOSIS — D500 Iron deficiency anemia secondary to blood loss (chronic): Secondary | ICD-10-CM

## 2019-04-14 DIAGNOSIS — Z5111 Encounter for antineoplastic chemotherapy: Secondary | ICD-10-CM | POA: Diagnosis not present

## 2019-04-14 DIAGNOSIS — Z95828 Presence of other vascular implants and grafts: Secondary | ICD-10-CM

## 2019-04-14 LAB — CBC WITH DIFFERENTIAL (CANCER CENTER ONLY)
Abs Immature Granulocytes: 0.04 10*3/uL (ref 0.00–0.07)
Basophils Absolute: 0.1 10*3/uL (ref 0.0–0.1)
Basophils Relative: 1 %
Eosinophils Absolute: 0.3 10*3/uL (ref 0.0–0.5)
Eosinophils Relative: 2 %
HCT: 33.4 % — ABNORMAL LOW (ref 39.0–52.0)
Hemoglobin: 10.5 g/dL — ABNORMAL LOW (ref 13.0–17.0)
Immature Granulocytes: 0 %
Lymphocytes Relative: 14 %
Lymphs Abs: 1.5 10*3/uL (ref 0.7–4.0)
MCH: 27.6 pg (ref 26.0–34.0)
MCHC: 31.4 g/dL (ref 30.0–36.0)
MCV: 87.7 fL (ref 80.0–100.0)
Monocytes Absolute: 0.6 10*3/uL (ref 0.1–1.0)
Monocytes Relative: 6 %
Neutro Abs: 7.9 10*3/uL — ABNORMAL HIGH (ref 1.7–7.7)
Neutrophils Relative %: 77 %
Platelet Count: 181 10*3/uL (ref 150–400)
RBC: 3.81 MIL/uL — ABNORMAL LOW (ref 4.22–5.81)
RDW: 15.9 % — ABNORMAL HIGH (ref 11.5–15.5)
WBC Count: 10.4 10*3/uL (ref 4.0–10.5)
nRBC: 0 % (ref 0.0–0.2)

## 2019-04-14 LAB — MAGNESIUM: Magnesium: 1.5 mg/dL — ABNORMAL LOW (ref 1.7–2.4)

## 2019-04-14 LAB — CMP (CANCER CENTER ONLY)
ALT: <6 U/L (ref 0–44)
AST: 12 U/L — ABNORMAL LOW (ref 15–41)
Albumin: 2.7 g/dL — ABNORMAL LOW (ref 3.5–5.0)
Alkaline Phosphatase: 94 U/L (ref 38–126)
Anion gap: 11 (ref 5–15)
BUN: 6 mg/dL (ref 6–20)
CO2: 21 mmol/L — ABNORMAL LOW (ref 22–32)
Calcium: 9 mg/dL (ref 8.9–10.3)
Chloride: 105 mmol/L (ref 98–111)
Creatinine: 0.78 mg/dL (ref 0.61–1.24)
GFR, Est AFR Am: >60 mL/min (ref >60)
GFR, Estimated: >60 mL/min (ref >60)
Glucose, Bld: 163 mg/dL — ABNORMAL HIGH (ref 70–99)
Potassium: 3.7 mmol/L (ref 3.5–5.1)
Sodium: 137 mmol/L (ref 135–145)
Total Bilirubin: 0.2 mg/dL — ABNORMAL LOW (ref 0.3–1.2)
Total Protein: 8.4 g/dL — ABNORMAL HIGH (ref 6.5–8.1)

## 2019-04-14 MED ORDER — HEPARIN SOD (PORK) LOCK FLUSH 100 UNIT/ML IV SOLN
500.0000 [IU] | Freq: Once | INTRAVENOUS | Status: AC | PRN
  Administered 2019-04-14: 500 [IU]
  Filled 2019-04-14: qty 5

## 2019-04-14 MED ORDER — PALONOSETRON HCL INJECTION 0.25 MG/5ML
INTRAVENOUS | Status: AC
  Filled 2019-04-14: qty 5

## 2019-04-14 MED ORDER — SODIUM CHLORIDE 0.9% FLUSH
10.0000 mL | INTRAVENOUS | Status: DC | PRN
  Administered 2019-04-14: 12:00:00 10 mL via INTRAVENOUS
  Filled 2019-04-14: qty 10

## 2019-04-14 MED ORDER — IRINOTECAN HCL CHEMO INJECTION 100 MG/5ML
120.0000 mg/m2 | Freq: Once | INTRAVENOUS | Status: AC
  Administered 2019-04-14: 200 mg via INTRAVENOUS
  Filled 2019-04-14: qty 10

## 2019-04-14 MED ORDER — ATROPINE SULFATE 1 MG/ML IJ SOLN
INTRAMUSCULAR | Status: AC
  Filled 2019-04-14: qty 1

## 2019-04-14 MED ORDER — ATROPINE SULFATE 1 MG/ML IJ SOLN
0.5000 mg | Freq: Once | INTRAMUSCULAR | Status: AC | PRN
  Administered 2019-04-14: 0.5 mg via INTRAVENOUS

## 2019-04-14 MED ORDER — SODIUM CHLORIDE 0.9 % IV SOLN
300.0000 mg | Freq: Once | INTRAVENOUS | Status: AC
  Administered 2019-04-14: 300 mg via INTRAVENOUS
  Filled 2019-04-14: qty 15

## 2019-04-14 MED ORDER — MAGNESIUM OXIDE 400 (241.3 MG) MG PO TABS: 400.0000 mg | ORAL_TABLET | Freq: Two times a day (BID) | ORAL | 3 refills | Status: DC

## 2019-04-14 MED ORDER — DEXAMETHASONE SODIUM PHOSPHATE 10 MG/ML IJ SOLN
INTRAMUSCULAR | Status: AC
  Filled 2019-04-14: qty 1

## 2019-04-14 MED ORDER — SODIUM CHLORIDE 0.9 % IV SOLN
1.0000 g | Freq: Once | INTRAVENOUS | Status: AC
  Administered 2019-04-14: 1 g via INTRAVENOUS
  Filled 2019-04-14: qty 2

## 2019-04-14 MED ORDER — SODIUM CHLORIDE 0.9% FLUSH
10.0000 mL | INTRAVENOUS | Status: DC | PRN
  Administered 2019-04-14: 10 mL
  Filled 2019-04-14: qty 10

## 2019-04-14 MED ORDER — PALONOSETRON HCL INJECTION 0.25 MG/5ML
0.2500 mg | Freq: Once | INTRAVENOUS | Status: AC
  Administered 2019-04-14: 0.25 mg via INTRAVENOUS

## 2019-04-14 MED ORDER — SODIUM CHLORIDE 0.9 % IV SOLN
Freq: Once | INTRAVENOUS | Status: AC
  Administered 2019-04-14: 13:00:00 via INTRAVENOUS
  Filled 2019-04-14: qty 250

## 2019-04-14 MED ORDER — DEXAMETHASONE SODIUM PHOSPHATE 10 MG/ML IJ SOLN
10.0000 mg | Freq: Once | INTRAMUSCULAR | Status: AC
  Administered 2019-04-14: 10 mg via INTRAVENOUS

## 2019-04-14 NOTE — Progress Notes (Signed)
04/14/19  Mag level reported back at 1.5 Give magnesium Sulfate 1 gm IVPB over 30 minutes today. May proceed with Vectibix.  V.O. Dr Erven Colla Vela Prose, PharmD

## 2019-04-14 NOTE — Progress Notes (Signed)
Patient made aware of magnesium prescription sent to [facility] outpatient pharmacy by [name]. Patient verbalized understanding.

## 2019-04-14 NOTE — Patient Instructions (Signed)
[facility] Discharge Instructions for Patients Receiving Chemotherapy  Today you received the following chemotherapy agents Irinotecan, Vectibix.  To help prevent nausea and vomiting after your treatment, we encourage you to take your nausea medication as directed.  If you develop nausea and vomiting that is not controlled by your nausea medication, call the clinic.   BELOW ARE SYMPTOMS THAT SHOULD BE REPORTED IMMEDIATELY:  *FEVER GREATER THAN 100.5 F  *CHILLS WITH OR WITHOUT FEVER  NAUSEA AND VOMITING THAT IS NOT CONTROLLED WITH YOUR NAUSEA MEDICATION  *UNUSUAL SHORTNESS OF BREATH  *UNUSUAL BRUISING OR BLEEDING  TENDERNESS IN MOUTH AND THROAT WITH OR WITHOUT PRESENCE OF ULCERS  *URINARY PROBLEMS  *BOWEL PROBLEMS  UNUSUAL RASH Items with * indicate a potential emergency and should be followed up as soon as possible.  Feel free to call the clinic should you have any questions or concerns. The clinic phone number is [phone].  Please show the [facility] at check-in to the Emergency Department and triage nurse.   Hypomagnesemia Hypomagnesemia is a condition in which the level of magnesium in the blood is low. Magnesium is a mineral that is found in many foods. It is used in many different processes in the body. Hypomagnesemia can affect every organ in the body. In severe cases, it can cause life-threatening problems. What are the causes? This condition may be caused by:  Not getting enough magnesium in your diet.  Malnutrition.  Problems with absorbing magnesium from the intestines.  Dehydration.  Alcohol abuse.  Vomiting.  Severe or chronic diarrhea.  Some medicines, including medicines that make you urinate more (diuretics).  Certain diseases, such as kidney disease, diabetes, celiac disease, and overactive thyroid. What are the signs or symptoms? Symptoms of this condition include:  Loss of appetite.  Nausea and vomiting.   Involuntary shaking or trembling of a body part (tremor).  Muscle weakness.  Tingling in the arms and legs.  Sudden tightening of muscles (muscle spasms).  Confusion.  Psychiatric issues, such as depression, irritability, or psychosis.  A feeling of fluttering of the heart.  Seizures. These symptoms are more severe if magnesium levels drop suddenly. How is this diagnosed? This condition may be diagnosed based on:  Your symptoms and medical history.  A physical exam.  Blood and urine tests. How is this treated? Treatment depends on the cause and the severity of the condition. It may be treated with:  A magnesium supplement. This can be taken in pill form. If the condition is severe, magnesium is usually given through an IV.  Changes to your diet. You may be directed to eat foods that have a lot of magnesium, such as green leafy vegetables, peas, beans, and nuts.  Stopping any intake of alcohol. Follow these instructions at home:      Make sure that your diet includes foods with magnesium. Foods that have a lot of magnesium in them include: ? Green leafy vegetables, such as spinach and broccoli. ? Beans and peas. ? Nuts and seeds, such as almonds and sunflower seeds. ? Whole grains, such as whole grain bread and fortified cereals.  Take magnesium supplements if your health care provider tells you to do that. Take them as directed.  Take over-the-counter and prescription medicines only as told by your health care provider.  Have your magnesium levels monitored as told by your health care provider.  When you are active, drink fluids that contain electrolytes.  Avoid drinking alcohol.  Keep all follow-up  visits as told by your health care provider. This is important. Contact a health care provider if:  You get worse instead of better.  Your symptoms return. Get help right away if you:  Develop severe muscle weakness.  Have trouble breathing.  Feel that your  heart is racing. Summary  Hypomagnesemia is a condition in which the level of magnesium in the blood is low.  Hypomagnesemia can affect every organ in the body.  Treatment may include eating more foods that contain magnesium, taking magnesium supplements, and not drinking alcohol.  Have your magnesium levels monitored as told by your health care provider. This information is not intended to replace advice given to you by your health care provider. Make sure you discuss any questions you have with your health care provider. Document Released: 09/09/2005 Document Revised: 11/15/2017 Document Reviewed: 11/15/2017 Elsevier Interactive Patient Education  2019 [employer].

## 2019-04-14 NOTE — Progress Notes (Signed)
Per Demetrius Charity, RPH it is ok to hang Irinotecan first then Vectibix due to waiting on magnesium level.

## 2019-04-17 ENCOUNTER — Ambulatory Visit
Admission: RE | Admit: 2019-04-17 | Discharge: 2019-04-17 | Disposition: A | Discharge status: Home / Self Care | Payer: Medicaid Other | Source: Ambulatory Visit | Attending: Hematology | Admitting: Hematology

## 2019-04-17 ENCOUNTER — Other Ambulatory Visit: Payer: Self-pay

## 2019-04-17 DIAGNOSIS — C189 Malignant neoplasm of colon, unspecified: Secondary | ICD-10-CM

## 2019-04-17 MED ORDER — IOPAMIDOL (ISOVUE-300) INJECTION 61%
100.0000 mL | Freq: Once | INTRAVENOUS | Status: AC | PRN
  Administered 2019-04-17: 14:00:00 100 mL via INTRAVENOUS

## 2019-04-18 ENCOUNTER — Telehealth: Payer: Self-pay | Admitting: Hematology

## 2019-04-18 ENCOUNTER — Other Ambulatory Visit: Payer: Self-pay | Admitting: Hematology

## 2019-04-18 MED ORDER — AMOXICILLIN-POT CLAVULANATE 875-125 MG PO TABS: 1.0000 | ORAL_TABLET | Freq: Two times a day (BID) | ORAL | 0 refills | Status: DC

## 2019-04-18 MED FILL — AMOX-CLAV 875-125 MG TABLET: 875-125 | 7 days supply | Qty: 14 | Fill #0

## 2019-04-18 NOTE — Telephone Encounter (Signed)
I reviewed his restaging CT scan from yesterday, which showed new b/l pulmonary infiltrates in low lobes, concerning for infections. I called pt and discussed with him. He has been having productive cough with yellownish phellem for the past 1-2 weeks, with intermittent chills, denies fever. Denies cough with swallowing, He tolerated chemo last week overall well.   I will call in augmentin 1 tab twice daily for 7 days to [place]. He will pick up today. He knows to call us if he has concerns. He will see NP Lacie next week before cycle 2 chemo.  [name]  04/18/2019

## 2019-04-23 MED FILL — POTASSIUM CHLORIDE CRYS ER: 20 | 30 days supply | Qty: 30 | Fill #1

## 2019-04-27 ENCOUNTER — Telehealth: Payer: Self-pay | Admitting: Nurse Practitioner

## 2019-04-27 ENCOUNTER — Encounter: Payer: Self-pay | Admitting: Nurse Practitioner

## 2019-04-27 ENCOUNTER — Inpatient Hospital Stay (HOSPITAL_BASED_OUTPATIENT_CLINIC_OR_DEPARTMENT_OTHER): Payer: Medicaid Other | Admitting: Nurse Practitioner

## 2019-04-27 ENCOUNTER — Inpatient Hospital Stay: Payer: Medicaid Other

## 2019-04-27 ENCOUNTER — Telehealth: Payer: Self-pay

## 2019-04-27 ENCOUNTER — Other Ambulatory Visit: Payer: Self-pay

## 2019-04-27 VITALS — BP 108/81 | HR 75 | Temp 97.5°F | Resp 17 | Ht 69.0 in | Wt 105.2 lb

## 2019-04-27 DIAGNOSIS — Z86718 Personal history of other venous thrombosis and embolism: Secondary | ICD-10-CM

## 2019-04-27 DIAGNOSIS — C786 Secondary malignant neoplasm of retroperitoneum and peritoneum: Secondary | ICD-10-CM

## 2019-04-27 DIAGNOSIS — C186 Malignant neoplasm of descending colon: Secondary | ICD-10-CM | POA: Diagnosis not present

## 2019-04-27 DIAGNOSIS — R197 Diarrhea, unspecified: Secondary | ICD-10-CM

## 2019-04-27 DIAGNOSIS — Z66 Do not resuscitate: Secondary | ICD-10-CM

## 2019-04-27 DIAGNOSIS — K56609 Unspecified intestinal obstruction, unspecified as to partial versus complete obstruction: Secondary | ICD-10-CM | POA: Diagnosis not present

## 2019-04-27 DIAGNOSIS — D500 Iron deficiency anemia secondary to blood loss (chronic): Secondary | ICD-10-CM

## 2019-04-27 DIAGNOSIS — D509 Iron deficiency anemia, unspecified: Secondary | ICD-10-CM

## 2019-04-27 DIAGNOSIS — Z95828 Presence of other vascular implants and grafts: Secondary | ICD-10-CM

## 2019-04-27 DIAGNOSIS — C189 Malignant neoplasm of colon, unspecified: Secondary | ICD-10-CM

## 2019-04-27 DIAGNOSIS — Z79899 Other long term (current) drug therapy: Secondary | ICD-10-CM

## 2019-04-27 DIAGNOSIS — D630 Anemia in neoplastic disease: Secondary | ICD-10-CM

## 2019-04-27 DIAGNOSIS — D638 Anemia in other chronic diseases classified elsewhere: Secondary | ICD-10-CM

## 2019-04-27 DIAGNOSIS — Z9221 Personal history of antineoplastic chemotherapy: Secondary | ICD-10-CM

## 2019-04-27 DIAGNOSIS — Z5111 Encounter for antineoplastic chemotherapy: Secondary | ICD-10-CM | POA: Diagnosis not present

## 2019-04-27 DIAGNOSIS — Z7901 Long term (current) use of anticoagulants: Secondary | ICD-10-CM

## 2019-04-27 LAB — CMP (CANCER CENTER ONLY)
ALT: <6 U/L (ref 0–44)
AST: 10 U/L — ABNORMAL LOW (ref 15–41)
Albumin: 3.1 g/dL — ABNORMAL LOW (ref 3.5–5.0)
Alkaline Phosphatase: 86 U/L (ref 38–126)
Anion gap: 10 (ref 5–15)
BUN: <4 mg/dL — ABNORMAL LOW (ref 6–20)
CO2: 23 mmol/L (ref 22–32)
Calcium: 9.4 mg/dL (ref 8.9–10.3)
Chloride: 100 mmol/L (ref 98–111)
Creatinine: 0.73 mg/dL (ref 0.61–1.24)
GFR, Est AFR Am: >60 mL/min (ref >60)
GFR, Estimated: >60 mL/min (ref >60)
Glucose, Bld: 133 mg/dL — ABNORMAL HIGH (ref 70–99)
Potassium: 3.2 mmol/L — ABNORMAL LOW (ref 3.5–5.1)
Sodium: 133 mmol/L — ABNORMAL LOW (ref 135–145)
Total Bilirubin: <0.2 mg/dL — ABNORMAL LOW (ref 0.3–1.2)
Total Protein: 8.8 g/dL — ABNORMAL HIGH (ref 6.5–8.1)

## 2019-04-27 LAB — MAGNESIUM: Magnesium: 1.4 mg/dL — CL (ref 1.7–2.4)

## 2019-04-27 LAB — CBC WITH DIFFERENTIAL (CANCER CENTER ONLY)
Abs Immature Granulocytes: 0.03 10*3/uL (ref 0.00–0.07)
Basophils Absolute: 0.1 10*3/uL (ref 0.0–0.1)
Basophils Relative: 1 %
Eosinophils Absolute: 0.2 10*3/uL (ref 0.0–0.5)
Eosinophils Relative: 3 %
HCT: 31.6 % — ABNORMAL LOW (ref 39.0–52.0)
Hemoglobin: 9.9 g/dL — ABNORMAL LOW (ref 13.0–17.0)
Immature Granulocytes: 0 %
Lymphocytes Relative: 22 %
Lymphs Abs: 1.5 10*3/uL (ref 0.7–4.0)
MCH: 27 pg (ref 26.0–34.0)
MCHC: 31.3 g/dL (ref 30.0–36.0)
MCV: 86.3 fL (ref 80.0–100.0)
Monocytes Absolute: 0.5 10*3/uL (ref 0.1–1.0)
Monocytes Relative: 7 %
Neutro Abs: 4.6 10*3/uL (ref 1.7–7.7)
Neutrophils Relative %: 67 %
Platelet Count: 189 10*3/uL (ref 150–400)
RBC: 3.66 MIL/uL — ABNORMAL LOW (ref 4.22–5.81)
RDW: 14.9 % (ref 11.5–15.5)
WBC Count: 6.9 10*3/uL (ref 4.0–10.5)
nRBC: 0 % (ref 0.0–0.2)

## 2019-04-27 MED ORDER — OXYCODONE HCL 10 MG PO TABS: 10.0000 mg | ORAL_TABLET | ORAL | 0 refills | Status: DC | PRN

## 2019-04-27 MED ORDER — MORPHINE SULFATE ER 60 MG PO TBCR: 60.0000 mg | EXTENDED_RELEASE_TABLET | Freq: Two times a day (BID) | ORAL | 0 refills | Status: DC

## 2019-04-27 MED ORDER — SODIUM CHLORIDE 0.9% FLUSH
10.0000 mL | INTRAVENOUS | Status: DC | PRN
  Filled 2019-04-27: qty 10

## 2019-04-27 MED ORDER — HEPARIN SOD (PORK) LOCK FLUSH 100 UNIT/ML IV SOLN
500.0000 [IU] | INTRAVENOUS | Status: DC | PRN
  Administered 2019-04-27: 500 [IU] via INTRAVENOUS
  Filled 2019-04-27: qty 5

## 2019-04-27 MED ORDER — SODIUM CHLORIDE 0.9% FLUSH
10.0000 mL | Freq: Once | INTRAVENOUS | Status: AC | PRN
  Administered 2019-04-27: 10 mL
  Filled 2019-04-27: qty 10

## 2019-04-27 MED FILL — oxyCODONE HCL 10 MG TABS: 10 | 30 days supply | Qty: 120 | Fill #0

## 2019-04-27 MED FILL — MORPHINE SULF 60 MG TAB SA: 60 | 30 days supply | Qty: 60 | Fill #0

## 2019-04-27 NOTE — Progress Notes (Signed)
CRITICAL VALUE STICKER  CRITICAL VALUE:  Magnesium 1.4  RECEIVER (on-site recipient of call): Valda Favia RN  DATE & TIME NOTIFIED: 04/27/2019 1250  MESSENGER (representative from lab): Vaughan Basta  MD NOTIFIED: Cira Rue NP  TIME OF NOTIFICATION:12:51  RESPONSE: No new orders received.

## 2019-04-27 NOTE — Progress Notes (Signed)
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 Currently on Ferrous Sulfate. Continue. -Labs reviewed,hemoglobin9.9  -no bleeding   4. Abdominal pain -He has chronic left upper quadrant abdominal pain, secondary to tumor. 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 He has taken 60-90 mg of MS contin daily lately, and needing a lot of oxycodone.  -I recommend to increase dose to 60 mg BID and keep oxycodone at current dose 10 mg q4h PRN.  -I reviewed dosing instructions and potential complications of noncompliance with opioids,  including respiratory depression and death. He understands. He will discard the remaining 30 mg MS contin tabs. New Rx sent to pharmacy today. -I explained his pain will hopefully improve with treatment, and we can decrease doses in the future potentially  5. 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 -Currently on Mirtazapineand Protonix. -his weight fluctuates, lost 8  lbs recently.  -I encouraged him to drink more boost/ensure and remain hydrated. And to eat small frequent amounts during the day.  6. Diarrhea  -Chemo related. Currently on Imodium and Lomotil as needed. -complicated by antibiotics -improving now  7. Goal of care discussion, DNR/DNI  -he agreed with DNR/DNI -he was on hospice care for 2-3 weeks in March 2020, he has withdrawn from hospice now. -I again explained the goal of treatment is to improve symptoms and quality of life, and to prolong his life. He understands.   Plan -labs reviewed -hold treatment today, postpone for 1 week  -lab, flush, f/u with [name], and irinotecan/vectibix in 1 week, likely will dose-reduce -increase Mg, take 1 tab TID until next f/u -increase K, take 1 tab BID until next f/u -pain medication adjusted: discontinue MS contin 30 mg BID. Start MS contin 60 mg BID with nighttime dose. No change to oxycodone 10 mg q4 PRN but refilled today. -he will call if he develops worsening cough, fever, SOB, or diarrhea    All questions were answered. The patient knows to call the clinic with any problems, questions or concerns. No barriers to learning was detected. I spent 20 minutes counseling the patient face to face. The total time spent in the appointment was 25 minutes and more than 50% was on counseling and review of test results     Alla Feeling, NP 04/27/19

## 2019-04-27 NOTE — Telephone Encounter (Signed)
TC to pt per Lacie to let him know that she would like for him to increase his magnesium to TID, and his potassium to BID until next weeks visit and he will repeat his labs aagin. Pt verbalized understanding. No further problems or concerns at this time.

## 2019-04-27 NOTE — Telephone Encounter (Signed)
Scheduled appts per 4/30 los.  Added treatment for 5/7 in the book for approval.

## 2019-04-28 ENCOUNTER — Telehealth: Payer: Self-pay | Admitting: Hematology

## 2019-04-28 NOTE — Telephone Encounter (Signed)
Called patient to inform them that their treatment for 5/7 has been added.  Patient aware of appt date and time.

## 2019-05-04 ENCOUNTER — Inpatient Hospital Stay: Payer: Medicaid Other | Admitting: Hematology

## 2019-05-04 ENCOUNTER — Telehealth: Payer: Self-pay

## 2019-05-04 ENCOUNTER — Inpatient Hospital Stay: Payer: Medicaid Other | Attending: Hematology

## 2019-05-04 ENCOUNTER — Telehealth: Payer: Self-pay | Admitting: *Deleted

## 2019-05-04 ENCOUNTER — Inpatient Hospital Stay: Payer: Medicaid Other

## 2019-05-04 DIAGNOSIS — Z66 Do not resuscitate: Secondary | ICD-10-CM | POA: Insufficient documentation

## 2019-05-04 DIAGNOSIS — R197 Diarrhea, unspecified: Secondary | ICD-10-CM | POA: Insufficient documentation

## 2019-05-04 DIAGNOSIS — Z5112 Encounter for antineoplastic immunotherapy: Secondary | ICD-10-CM | POA: Insufficient documentation

## 2019-05-04 DIAGNOSIS — Z79899 Other long term (current) drug therapy: Secondary | ICD-10-CM | POA: Insufficient documentation

## 2019-05-04 DIAGNOSIS — D638 Anemia in other chronic diseases classified elsewhere: Secondary | ICD-10-CM | POA: Insufficient documentation

## 2019-05-04 DIAGNOSIS — K56609 Unspecified intestinal obstruction, unspecified as to partial versus complete obstruction: Secondary | ICD-10-CM | POA: Insufficient documentation

## 2019-05-04 DIAGNOSIS — Z86718 Personal history of other venous thrombosis and embolism: Secondary | ICD-10-CM | POA: Insufficient documentation

## 2019-05-04 DIAGNOSIS — Z9221 Personal history of antineoplastic chemotherapy: Secondary | ICD-10-CM | POA: Insufficient documentation

## 2019-05-04 DIAGNOSIS — Z7901 Long term (current) use of anticoagulants: Secondary | ICD-10-CM | POA: Insufficient documentation

## 2019-05-04 DIAGNOSIS — C186 Malignant neoplasm of descending colon: Secondary | ICD-10-CM | POA: Insufficient documentation

## 2019-05-04 DIAGNOSIS — C786 Secondary malignant neoplasm of retroperitoneum and peritoneum: Secondary | ICD-10-CM | POA: Insufficient documentation

## 2019-05-04 NOTE — Telephone Encounter (Signed)
Received call from pt's daughter. She states that pt thought his appt was at 10:30 this am, but appt was at 9:30.  She is asking if it is ok for patient come in now. Spoke with [name] and infusion room charge nurse.  But agreed that pt can come in now. [name] will see him in the infusion area  Returned call to daughter and advised her of the above.  Pt is on his way here now.

## 2019-05-04 NOTE — Telephone Encounter (Signed)
Explained to patient's daughter we will have to reschedule his appointments from today due to him being so late for his appointments.  She verbalized an understanding and I have sent a scheduling message to reschedule.

## 2019-05-05 MED FILL — MIRTAZAPINE 15 MG TABLET: 15 | 30 days supply | Qty: 30 | Fill #1

## 2019-05-10 ENCOUNTER — Telehealth: Payer: Self-pay | Admitting: Hematology

## 2019-05-10 NOTE — Telephone Encounter (Signed)
OK to refill?  Drucie Ip, RN

## 2019-05-10 NOTE — Telephone Encounter (Signed)
R/s appt from last week to 5/15 - pt daughter is aware of appt date and time

## 2019-05-11 NOTE — Progress Notes (Signed)
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 -He has chronic left upper quadrant abdominal pain, secondary to tumor. 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 He has been coughing lately.  -He takes MS Contin 352mBID and Oxycodone 1021m4-5 times a day) as needed. -He also takes Gabapentin for his leg pain.  6. 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 -Currently on Mirtazapineand Protonix. -He has been able to gain 10 pounds in the last month.  -appetite is fair and weight is overall stable currently.   7. Diarrhea  -Chemo related. Currently on Imodium and Lomotil as needed. -Has had recent mild constipation with BM every 2 days. Will monitor  8. Goal of care discussion, DNR/DNI  -Wepreviously discussed the incurable nature of his cancer, and the overall poor prognosis, especiallyhis overall condition has deteriorated  latley -he agreed with DNR/DNI -he was on hospice care for 2-3 weeks in March 2020, he has withdrawn from hospice now.  Plan -Due to possible bronchitis, will obtain Chest Xray today to rule out PNA -No chemo today  -I called in Augmentin for 7 days  -Lab, flush, F/u and chemo irinotecan and vectibix in 1 week   No problem-specific Assessment & Plan notes found for this encounter.   Orders Placed This Encounter  Procedures   DG Chest 2 View    Standing Status:   Future    Standing Expiration Date:   05/11/2020    Order Specific Question:   Reason for Exam (SYMPTOM  OR DIAGNOSIS REQUIRED)    Answer:   productive cough, rule out infection    Order Specific Question:   Preferred imaging location?    Answer:   [facility] [place]    Order Specific Question:   Radiology Contrast Protocol - do NOT remove file path    Answer:   \[email or web address]   All questions were answered. The patient knows to call the clinic with any problems, questions or concerns. No barriers to learning was detected. I spent 20 minutes counseling the patient face to face. The total time spent in the appointment was 25 minutes and more than 50% was on counseling and review of test results     [name], MD 05/12/2019   I, Joslyn Devon, am acting as scribe for [name], MD.   I have reviewed the above documentation for accuracy and completeness, and I agree with the above.   Addendum His CXR was unremarkable. I called his daughter and updated her. She appreciated the call.  [name]  05/12/2019

## 2019-05-12 ENCOUNTER — Inpatient Hospital Stay (HOSPITAL_BASED_OUTPATIENT_CLINIC_OR_DEPARTMENT_OTHER): Payer: Medicaid Other | Admitting: Hematology

## 2019-05-12 ENCOUNTER — Inpatient Hospital Stay: Payer: Medicaid Other

## 2019-05-12 ENCOUNTER — Ambulatory Visit (HOSPITAL_COMMUNITY)
Admission: RE | Admit: 2019-05-12 | Discharge: 2019-05-12 | Disposition: A | Discharge status: Home / Self Care | Payer: Medicaid Other | Source: Ambulatory Visit | Attending: Hematology | Admitting: Hematology

## 2019-05-12 ENCOUNTER — Other Ambulatory Visit: Payer: Self-pay

## 2019-05-12 ENCOUNTER — Ambulatory Visit: Payer: Self-pay

## 2019-05-12 ENCOUNTER — Ambulatory Visit: Payer: Self-pay | Admitting: Hematology

## 2019-05-12 VITALS — BP 104/79 | HR 89 | Temp 98.9°F | Resp 17 | Ht 69.0 in | Wt 109.1 lb

## 2019-05-12 DIAGNOSIS — D638 Anemia in other chronic diseases classified elsewhere: Secondary | ICD-10-CM

## 2019-05-12 DIAGNOSIS — K56609 Unspecified intestinal obstruction, unspecified as to partial versus complete obstruction: Secondary | ICD-10-CM

## 2019-05-12 DIAGNOSIS — C786 Secondary malignant neoplasm of retroperitoneum and peritoneum: Secondary | ICD-10-CM

## 2019-05-12 DIAGNOSIS — D630 Anemia in neoplastic disease: Secondary | ICD-10-CM

## 2019-05-12 DIAGNOSIS — Z9221 Personal history of antineoplastic chemotherapy: Secondary | ICD-10-CM

## 2019-05-12 DIAGNOSIS — Z86718 Personal history of other venous thrombosis and embolism: Secondary | ICD-10-CM

## 2019-05-12 DIAGNOSIS — Z7901 Long term (current) use of anticoagulants: Secondary | ICD-10-CM

## 2019-05-12 DIAGNOSIS — Z66 Do not resuscitate: Secondary | ICD-10-CM

## 2019-05-12 DIAGNOSIS — C189 Malignant neoplasm of colon, unspecified: Secondary | ICD-10-CM

## 2019-05-12 DIAGNOSIS — Z79899 Other long term (current) drug therapy: Secondary | ICD-10-CM | POA: Diagnosis not present

## 2019-05-12 DIAGNOSIS — D500 Iron deficiency anemia secondary to blood loss (chronic): Secondary | ICD-10-CM

## 2019-05-12 DIAGNOSIS — C186 Malignant neoplasm of descending colon: Secondary | ICD-10-CM

## 2019-05-12 DIAGNOSIS — R197 Diarrhea, unspecified: Secondary | ICD-10-CM

## 2019-05-12 DIAGNOSIS — K589 Irritable bowel syndrome without diarrhea: Secondary | ICD-10-CM

## 2019-05-12 DIAGNOSIS — Z5112 Encounter for antineoplastic immunotherapy: Secondary | ICD-10-CM | POA: Diagnosis present

## 2019-05-12 DIAGNOSIS — Z95828 Presence of other vascular implants and grafts: Secondary | ICD-10-CM

## 2019-05-12 DIAGNOSIS — C762 Malignant neoplasm of abdomen: Secondary | ICD-10-CM

## 2019-05-12 DIAGNOSIS — D509 Iron deficiency anemia, unspecified: Secondary | ICD-10-CM

## 2019-05-12 LAB — CBC WITH DIFFERENTIAL (CANCER CENTER ONLY)
Abs Immature Granulocytes: 0.05 10*3/uL (ref 0.00–0.07)
Basophils Absolute: 0.1 10*3/uL (ref 0.0–0.1)
Basophils Relative: 1 %
Eosinophils Absolute: 0.2 10*3/uL (ref 0.0–0.5)
Eosinophils Relative: 1 %
HCT: 30.6 % — ABNORMAL LOW (ref 39.0–52.0)
Hemoglobin: 9.4 g/dL — ABNORMAL LOW (ref 13.0–17.0)
Immature Granulocytes: 0 %
Lymphocytes Relative: 14 %
Lymphs Abs: 1.8 10*3/uL (ref 0.7–4.0)
MCH: 27.1 pg (ref 26.0–34.0)
MCHC: 30.7 g/dL (ref 30.0–36.0)
MCV: 88.2 fL (ref 80.0–100.0)
Monocytes Absolute: 0.7 10*3/uL (ref 0.1–1.0)
Monocytes Relative: 6 %
Neutro Abs: 10.1 10*3/uL — ABNORMAL HIGH (ref 1.7–7.7)
Neutrophils Relative %: 78 %
Platelet Count: 167 10*3/uL (ref 150–400)
RBC: 3.47 MIL/uL — ABNORMAL LOW (ref 4.22–5.81)
RDW: 15 % (ref 11.5–15.5)
WBC Count: 12.9 10*3/uL — ABNORMAL HIGH (ref 4.0–10.5)
nRBC: 0 % (ref 0.0–0.2)

## 2019-05-12 LAB — CMP (CANCER CENTER ONLY)
ALT: <6 U/L (ref 0–44)
AST: 13 U/L — ABNORMAL LOW (ref 15–41)
Albumin: 2.6 g/dL — ABNORMAL LOW (ref 3.5–5.0)
Alkaline Phosphatase: 93 U/L (ref 38–126)
Anion gap: 6 (ref 5–15)
BUN: 7 mg/dL (ref 6–20)
CO2: 27 mmol/L (ref 22–32)
Calcium: 8.6 mg/dL — ABNORMAL LOW (ref 8.9–10.3)
Chloride: 101 mmol/L (ref 98–111)
Creatinine: 0.74 mg/dL (ref 0.61–1.24)
GFR, Est AFR Am: >60 mL/min (ref >60)
GFR, Estimated: >60 mL/min (ref >60)
Glucose, Bld: 99 mg/dL (ref 70–99)
Potassium: 3.9 mmol/L (ref 3.5–5.1)
Sodium: 134 mmol/L — ABNORMAL LOW (ref 135–145)
Total Bilirubin: 0.2 mg/dL — ABNORMAL LOW (ref 0.3–1.2)
Total Protein: 7.9 g/dL (ref 6.5–8.1)

## 2019-05-12 LAB — CEA (IN HOUSE-CHCC): CEA (CHCC-In House): 32.85 ng/mL — ABNORMAL HIGH (ref 0.00–5.00)

## 2019-05-12 LAB — MAGNESIUM: Magnesium: 1.6 mg/dL — ABNORMAL LOW (ref 1.7–2.4)

## 2019-05-12 LAB — FERRITIN: Ferritin: 468 ng/mL — ABNORMAL HIGH (ref 24–336)

## 2019-05-12 MED ORDER — AMOXICILLIN-POT CLAVULANATE 875-125 MG PO TABS: 1.0000 | ORAL_TABLET | Freq: Two times a day (BID) | ORAL | 0 refills | Status: DC

## 2019-05-12 MED ORDER — HEPARIN SOD (PORK) LOCK FLUSH 100 UNIT/ML IV SOLN
500.0000 [IU] | Freq: Once | INTRAVENOUS | Status: DC
  Filled 2019-05-12: qty 5

## 2019-05-12 MED ORDER — GABAPENTIN 300 MG PO CAPS: 300.0000 mg | ORAL_CAPSULE | Freq: Three times a day (TID) | ORAL | 0 refills | Status: DC

## 2019-05-12 MED ORDER — SODIUM CHLORIDE 0.9% FLUSH
10.0000 mL | INTRAVENOUS | Status: DC | PRN
  Administered 2019-05-12: 10 mL via INTRAVENOUS
  Filled 2019-05-12: qty 10

## 2019-05-12 MED ORDER — HYOSCYAMINE SULFATE 0.125 MG SL SUBL: 0.1250 mg | SUBLINGUAL_TABLET | Freq: Four times a day (QID) | SUBLINGUAL | 2 refills | Status: DC | PRN

## 2019-05-12 MED FILL — GABAPENTIN 300 MG CAPSULE: 300 | 30 days supply | Qty: 90 | Fill #1

## 2019-05-12 MED FILL — AMOX-CLAV 875-125 MG TABLET: 875-125 | 7 days supply | Qty: 14 | Fill #0

## 2019-05-12 MED FILL — PANTOPRAZOLE SOD DR 40 MG T: 40 | 30 days supply | Qty: 30 | Fill #1

## 2019-05-12 MED FILL — XARELTO 20 MG TABLET: 20 | 30 days supply | Qty: 30 | Fill #1

## 2019-05-12 MED FILL — OSCIMIN SL 0.125 MG TABLET: 0.125 | 8 days supply | Qty: 30 | Fill #0

## 2019-05-13 ENCOUNTER — Encounter: Payer: Self-pay | Admitting: Hematology

## 2019-05-15 ENCOUNTER — Telehealth: Payer: Self-pay | Admitting: Hematology

## 2019-05-15 NOTE — Telephone Encounter (Signed)
No los per 5/15. °

## 2019-05-17 ENCOUNTER — Other Ambulatory Visit: Payer: Self-pay | Admitting: Hematology

## 2019-05-17 MED FILL — POTASSIUM CHLORIDE CRYS ER: 20 | 30 days supply | Qty: 30 | Fill #2

## 2019-05-18 ENCOUNTER — Other Ambulatory Visit: Payer: Self-pay

## 2019-05-18 ENCOUNTER — Inpatient Hospital Stay (HOSPITAL_BASED_OUTPATIENT_CLINIC_OR_DEPARTMENT_OTHER): Payer: Medicaid Other | Admitting: Nurse Practitioner

## 2019-05-18 ENCOUNTER — Telehealth: Payer: Self-pay | Admitting: Nurse Practitioner

## 2019-05-18 ENCOUNTER — Encounter: Payer: Self-pay | Admitting: Nurse Practitioner

## 2019-05-18 ENCOUNTER — Inpatient Hospital Stay: Payer: Medicaid Other

## 2019-05-18 ENCOUNTER — Encounter: Payer: Self-pay | Admitting: Nutrition

## 2019-05-18 VITALS — BP 92/50 | HR 86 | Temp 98.8°F | Resp 18 | Ht 69.0 in | Wt 108.4 lb

## 2019-05-18 DIAGNOSIS — C189 Malignant neoplasm of colon, unspecified: Secondary | ICD-10-CM

## 2019-05-18 DIAGNOSIS — C186 Malignant neoplasm of descending colon: Secondary | ICD-10-CM | POA: Diagnosis not present

## 2019-05-18 DIAGNOSIS — Z95828 Presence of other vascular implants and grafts: Secondary | ICD-10-CM

## 2019-05-18 DIAGNOSIS — R197 Diarrhea, unspecified: Secondary | ICD-10-CM

## 2019-05-18 DIAGNOSIS — K56609 Unspecified intestinal obstruction, unspecified as to partial versus complete obstruction: Secondary | ICD-10-CM

## 2019-05-18 DIAGNOSIS — D500 Iron deficiency anemia secondary to blood loss (chronic): Secondary | ICD-10-CM

## 2019-05-18 DIAGNOSIS — Z5112 Encounter for antineoplastic immunotherapy: Secondary | ICD-10-CM | POA: Diagnosis not present

## 2019-05-18 DIAGNOSIS — Z66 Do not resuscitate: Secondary | ICD-10-CM

## 2019-05-18 DIAGNOSIS — Z9221 Personal history of antineoplastic chemotherapy: Secondary | ICD-10-CM

## 2019-05-18 DIAGNOSIS — D638 Anemia in other chronic diseases classified elsewhere: Secondary | ICD-10-CM

## 2019-05-18 DIAGNOSIS — C786 Secondary malignant neoplasm of retroperitoneum and peritoneum: Secondary | ICD-10-CM

## 2019-05-18 DIAGNOSIS — Z79899 Other long term (current) drug therapy: Secondary | ICD-10-CM

## 2019-05-18 DIAGNOSIS — Z86718 Personal history of other venous thrombosis and embolism: Secondary | ICD-10-CM

## 2019-05-18 DIAGNOSIS — D509 Iron deficiency anemia, unspecified: Secondary | ICD-10-CM

## 2019-05-18 DIAGNOSIS — Z7901 Long term (current) use of anticoagulants: Secondary | ICD-10-CM

## 2019-05-18 DIAGNOSIS — E876 Hypokalemia: Secondary | ICD-10-CM

## 2019-05-18 DIAGNOSIS — D630 Anemia in neoplastic disease: Secondary | ICD-10-CM

## 2019-05-18 LAB — CMP (CANCER CENTER ONLY)
ALT: <6 U/L (ref 0–44)
AST: 12 U/L — ABNORMAL LOW (ref 15–41)
Albumin: 2.7 g/dL — ABNORMAL LOW (ref 3.5–5.0)
Alkaline Phosphatase: 84 U/L (ref 38–126)
Anion gap: 8 (ref 5–15)
BUN: 8 mg/dL (ref 6–20)
CO2: 26 mmol/L (ref 22–32)
Calcium: 8.8 mg/dL — ABNORMAL LOW (ref 8.9–10.3)
Chloride: 102 mmol/L (ref 98–111)
Creatinine: 0.84 mg/dL (ref 0.61–1.24)
GFR, Est AFR Am: >60 mL/min (ref >60)
GFR, Estimated: >60 mL/min (ref >60)
Glucose, Bld: 142 mg/dL — ABNORMAL HIGH (ref 70–99)
Potassium: 3.9 mmol/L (ref 3.5–5.1)
Sodium: 136 mmol/L (ref 135–145)
Total Bilirubin: <0.2 mg/dL — ABNORMAL LOW (ref 0.3–1.2)
Total Protein: 8.2 g/dL — ABNORMAL HIGH (ref 6.5–8.1)

## 2019-05-18 LAB — CBC WITH DIFFERENTIAL (CANCER CENTER ONLY)
Abs Immature Granulocytes: 0.03 10*3/uL (ref 0.00–0.07)
Basophils Absolute: 0.1 10*3/uL (ref 0.0–0.1)
Basophils Relative: 1 %
Eosinophils Absolute: 0.2 10*3/uL (ref 0.0–0.5)
Eosinophils Relative: 2 %
HCT: 29.8 % — ABNORMAL LOW (ref 39.0–52.0)
Hemoglobin: 9.2 g/dL — ABNORMAL LOW (ref 13.0–17.0)
Immature Granulocytes: 0 %
Lymphocytes Relative: 18 %
Lymphs Abs: 1.8 10*3/uL (ref 0.7–4.0)
MCH: 26.9 pg (ref 26.0–34.0)
MCHC: 30.9 g/dL (ref 30.0–36.0)
MCV: 87.1 fL (ref 80.0–100.0)
Monocytes Absolute: 0.6 10*3/uL (ref 0.1–1.0)
Monocytes Relative: 6 %
Neutro Abs: 7.3 10*3/uL (ref 1.7–7.7)
Neutrophils Relative %: 73 %
Platelet Count: 192 10*3/uL (ref 150–400)
RBC: 3.42 MIL/uL — ABNORMAL LOW (ref 4.22–5.81)
RDW: 14.6 % (ref 11.5–15.5)
WBC Count: 10.1 10*3/uL (ref 4.0–10.5)
nRBC: 0 % (ref 0.0–0.2)

## 2019-05-18 LAB — MAGNESIUM: Magnesium: 1.7 mg/dL (ref 1.7–2.4)

## 2019-05-18 MED ORDER — SODIUM CHLORIDE 0.9% FLUSH
10.0000 mL | INTRAVENOUS | Status: DC | PRN
  Administered 2019-05-18: 14:00:00 10 mL
  Filled 2019-05-18: qty 10

## 2019-05-18 MED ORDER — SODIUM CHLORIDE 0.9 % IV SOLN
Freq: Once | INTRAVENOUS | Status: AC
  Administered 2019-05-18: 10:00:00 via INTRAVENOUS
  Filled 2019-05-18: qty 250

## 2019-05-18 MED ORDER — SODIUM CHLORIDE 0.9 % IV SOLN
INTRAVENOUS | Status: DC
  Administered 2019-05-18: 10:00:00 via INTRAVENOUS
  Filled 2019-05-18 (×2): qty 250

## 2019-05-18 MED ORDER — POTASSIUM CHLORIDE CRYS ER 20 MEQ PO TBCR: 20.0000 meq | EXTENDED_RELEASE_TABLET | Freq: Two times a day (BID) | ORAL | 1 refills | Status: DC

## 2019-05-18 MED ORDER — OXYCODONE HCL 10 MG PO TABS: 10.0000 mg | ORAL_TABLET | ORAL | 0 refills | Status: DC | PRN

## 2019-05-18 MED ORDER — SODIUM CHLORIDE 0.9% FLUSH
10.0000 mL | INTRAVENOUS | Status: DC | PRN
  Administered 2019-05-18: 10 mL via INTRAVENOUS
  Filled 2019-05-18: qty 10

## 2019-05-18 MED ORDER — HEPARIN SOD (PORK) LOCK FLUSH 100 UNIT/ML IV SOLN
500.0000 [IU] | Freq: Once | INTRAVENOUS | Status: AC | PRN
  Administered 2019-05-18: 500 [IU]
  Filled 2019-05-18: qty 5

## 2019-05-18 MED ORDER — SODIUM CHLORIDE 0.9 % IV SOLN
6.0000 mg/kg | Freq: Once | INTRAVENOUS | Status: AC
  Administered 2019-05-18: 12:00:00 300 mg via INTRAVENOUS
  Filled 2019-05-18: qty 15

## 2019-05-18 MED FILL — oxyCODONE HCL 10 MG TABS: 10 | 20 days supply | Qty: 120 | Fill #0

## 2019-05-18 MED FILL — POTASSIUM CHLORIDE CRYS ER: 20 | 30 days supply | Qty: 60 | Fill #0

## 2019-05-18 NOTE — Progress Notes (Signed)
Provided one complimentary case of Ensure Enlive to patient.

## 2019-05-18 NOTE — Patient Instructions (Signed)
Coronavirus (COVID-19) Are you at risk?  Are you at risk for the Coronavirus (COVID-19)?  To be considered HIGH RISK for Coronavirus (COVID-19), you have to meet the following criteria:  . Traveled to [place], [place], [place], [place] or [place]; or in the [place] to [place], [place], [place], or [place]; and have fever, cough, and shortness of breath within the last 2 weeks of travel OR . Been in close contact with a person diagnosed with COVID-19 within the last 2 weeks and have fever, cough, and shortness of breath . IF YOU DO NOT MEET THESE CRITERIA, YOU ARE CONSIDERED LOW RISK FOR COVID-19.  What to do if you are HIGH RISK for COVID-19?  . If you are having a medical emergency, call 911. . Seek medical care right away. Before you go to a doctor's office, urgent care or emergency department, call ahead and tell them about your recent travel, contact with someone diagnosed with COVID-19, and your symptoms. You should receive instructions from your physician's office regarding next steps of care.  . When you arrive at healthcare provider, tell the healthcare staff immediately you have returned from visiting [place], [place], [place], [place] or [place]; or traveled in the [place] to [place], [place], [place], or [place]; in the last two weeks or you have been in close contact with a person diagnosed with COVID-19 in the last 2 weeks.   . Tell the health care staff about your symptoms: fever, cough and shortness of breath. . After you have been seen by a medical provider, you will be either: o Tested for (COVID-19) and discharged home on quarantine except to seek medical care if symptoms worsen, and asked to  - Stay home and avoid contact with others until you get your results (4-5 days)  - Avoid travel on public transportation if possible (such as bus, train, or airplane) or o Sent to the Emergency Department by EMS for evaluation, COVID-19 testing, and possible  admission depending on your condition and test results.  What to do if you are LOW RISK for COVID-19?  Reduce your risk of any infection by using the same precautions used for avoiding the common cold or flu:  . Wash your hands often with soap and warm water for at least 20 seconds.  If soap and water are not readily available, use an alcohol-based hand sanitizer with at least 60% alcohol.  . If coughing or sneezing, cover your mouth and nose by coughing or sneezing into the elbow areas of your shirt or coat, into a tissue or into your sleeve (not your hands). . Avoid shaking hands with others and consider head nods or verbal greetings only. . Avoid touching your eyes, nose, or mouth with unwashed hands.  . Avoid close contact with people who are sick. . Avoid places or events with large numbers of people in one location, like concerts or sporting events. . Carefully consider travel plans you have or are making. . If you are planning any travel outside or inside the [place], visit the CDC's Travelers' Health webpage for the latest health notices. . If you have some symptoms but not all symptoms, continue to monitor at home and seek medical attention if your symptoms worsen. . If you are having a medical emergency, call 911.   ADDITIONAL HEALTHCARE OPTIONS FOR PATIENTS  [facility] / e-Visit: [email or web address]         [facility]: [phone]  [facility] [phone]                   [facility]: [phone]   [facility] Discharge Instructions for Patients Receiving Chemotherapy  Today you received the following chemotherapy agents Vectibix  To help prevent nausea and vomiting after your treatment, we encourage you to take your nausea medication as directed.    If you develop nausea and vomiting that is not controlled by your nausea medication, call the clinic.   BELOW ARE  SYMPTOMS THAT SHOULD BE REPORTED IMMEDIATELY:  *FEVER GREATER THAN 100.5 F  *CHILLS WITH OR WITHOUT FEVER  NAUSEA AND VOMITING THAT IS NOT CONTROLLED WITH YOUR NAUSEA MEDICATION  *UNUSUAL SHORTNESS OF BREATH  *UNUSUAL BRUISING OR BLEEDING  TENDERNESS IN MOUTH AND THROAT WITH OR WITHOUT PRESENCE OF ULCERS  *URINARY PROBLEMS  *BOWEL PROBLEMS  UNUSUAL RASH Items with * indicate a potential emergency and should be followed up as soon as possible.  Feel free to call the clinic should you have any questions or concerns. The clinic phone number is [phone].  Please show the CHEMO ALERT CARD at check-in to the Emergency Department and triage nurse.   

## 2019-05-18 NOTE — Progress Notes (Signed)
Per Bella Kennedy, patient will receive panitumumab only today, 05/18/19.   Demetrius Charity, PharmD, [facility] Phone: [phone] 05/18/2019

## 2019-05-18 NOTE — Telephone Encounter (Signed)
Scheduled appt per 5/21 los.  Added treatment to the book for approval. Will contact patient once treatment has been added

## 2019-05-18 NOTE — Progress Notes (Addendum)
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 oxycodone during the day, 3-4 times per day; will refill  5. 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 -Currently on MirtazapineandProtonix. -I encouraged him to drink more boost/ensure and remain hydrated. And to eat small frequent amounts during the day. -weight stable for now  6. Diarrhea  -Chemo related. Currently on Imodium and Lomotil as needed. -complicated by antibiotics -empties bag 3 times per day, stools are back to baseline   7. Goal of care discussion, DNR/DNI,  on Hospice in March but PS improved and un-enrolled   Plan -Labs reviewed -Vectibix only today -1 liter NS for BP  -lab, f/u in 1 week  -f/u with each treatment  -Refill K, oxycodone  -Message to dietician for Ensure supply   All questions were answered. The patient knows to call the clinic with any problems, questions or concerns. No barriers to learning was detected.     Alla Feeling, NP 05/18/19

## 2019-05-24 NOTE — Progress Notes (Signed)
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 detected.     LAlla Feeling NP 05/25/19

## 2019-05-25 ENCOUNTER — Inpatient Hospital Stay (HOSPITAL_BASED_OUTPATIENT_CLINIC_OR_DEPARTMENT_OTHER): Payer: Medicaid Other | Admitting: Nurse Practitioner

## 2019-05-25 ENCOUNTER — Inpatient Hospital Stay: Payer: Medicaid Other

## 2019-05-25 ENCOUNTER — Encounter: Payer: Self-pay | Admitting: Nurse Practitioner

## 2019-05-25 ENCOUNTER — Other Ambulatory Visit: Payer: Self-pay

## 2019-05-25 VITALS — BP 95/74 | HR 99 | Temp 98.2°F | Resp 16 | Ht 69.0 in | Wt 110.2 lb

## 2019-05-25 DIAGNOSIS — D638 Anemia in other chronic diseases classified elsewhere: Secondary | ICD-10-CM

## 2019-05-25 DIAGNOSIS — D500 Iron deficiency anemia secondary to blood loss (chronic): Secondary | ICD-10-CM

## 2019-05-25 DIAGNOSIS — Z66 Do not resuscitate: Secondary | ICD-10-CM

## 2019-05-25 DIAGNOSIS — Z95828 Presence of other vascular implants and grafts: Secondary | ICD-10-CM

## 2019-05-25 DIAGNOSIS — C186 Malignant neoplasm of descending colon: Secondary | ICD-10-CM | POA: Diagnosis not present

## 2019-05-25 DIAGNOSIS — Z7901 Long term (current) use of anticoagulants: Secondary | ICD-10-CM

## 2019-05-25 DIAGNOSIS — Z86718 Personal history of other venous thrombosis and embolism: Secondary | ICD-10-CM | POA: Diagnosis not present

## 2019-05-25 DIAGNOSIS — D509 Iron deficiency anemia, unspecified: Secondary | ICD-10-CM

## 2019-05-25 DIAGNOSIS — K56609 Unspecified intestinal obstruction, unspecified as to partial versus complete obstruction: Secondary | ICD-10-CM

## 2019-05-25 DIAGNOSIS — Z79899 Other long term (current) drug therapy: Secondary | ICD-10-CM

## 2019-05-25 DIAGNOSIS — Z5112 Encounter for antineoplastic immunotherapy: Secondary | ICD-10-CM | POA: Diagnosis not present

## 2019-05-25 DIAGNOSIS — C786 Secondary malignant neoplasm of retroperitoneum and peritoneum: Secondary | ICD-10-CM | POA: Diagnosis not present

## 2019-05-25 DIAGNOSIS — D630 Anemia in neoplastic disease: Secondary | ICD-10-CM

## 2019-05-25 DIAGNOSIS — Z9221 Personal history of antineoplastic chemotherapy: Secondary | ICD-10-CM

## 2019-05-25 DIAGNOSIS — R197 Diarrhea, unspecified: Secondary | ICD-10-CM

## 2019-05-25 DIAGNOSIS — C189 Malignant neoplasm of colon, unspecified: Secondary | ICD-10-CM

## 2019-05-25 LAB — CMP (CANCER CENTER ONLY)
ALT: <6 U/L (ref 0–44)
AST: 17 U/L (ref 15–41)
Albumin: 2.4 g/dL — ABNORMAL LOW (ref 3.5–5.0)
Alkaline Phosphatase: 95 U/L (ref 38–126)
Anion gap: 5 (ref 5–15)
BUN: 4 mg/dL — ABNORMAL LOW (ref 6–20)
CO2: 26 mmol/L (ref 22–32)
Calcium: 8 mg/dL — ABNORMAL LOW (ref 8.9–10.3)
Chloride: 105 mmol/L (ref 98–111)
Creatinine: 0.69 mg/dL (ref 0.61–1.24)
GFR, Est AFR Am: >60 mL/min (ref >60)
GFR, Estimated: >60 mL/min (ref >60)
Glucose, Bld: 97 mg/dL (ref 70–99)
Potassium: 4.1 mmol/L (ref 3.5–5.1)
Sodium: 136 mmol/L (ref 135–145)
Total Bilirubin: 0.2 mg/dL — ABNORMAL LOW (ref 0.3–1.2)
Total Protein: 7.5 g/dL (ref 6.5–8.1)

## 2019-05-25 LAB — CBC WITH DIFFERENTIAL (CANCER CENTER ONLY)
Abs Immature Granulocytes: 0.04 10*3/uL (ref 0.00–0.07)
Basophils Absolute: 0.1 10*3/uL (ref 0.0–0.1)
Basophils Relative: 1 %
Eosinophils Absolute: 0.2 10*3/uL (ref 0.0–0.5)
Eosinophils Relative: 2 %
HCT: 28.7 % — ABNORMAL LOW (ref 39.0–52.0)
Hemoglobin: 8.8 g/dL — ABNORMAL LOW (ref 13.0–17.0)
Immature Granulocytes: 0 %
Lymphocytes Relative: 19 %
Lymphs Abs: 2 10*3/uL (ref 0.7–4.0)
MCH: 27.3 pg (ref 26.0–34.0)
MCHC: 30.7 g/dL (ref 30.0–36.0)
MCV: 89.1 fL (ref 80.0–100.0)
Monocytes Absolute: 0.6 10*3/uL (ref 0.1–1.0)
Monocytes Relative: 6 %
Neutro Abs: 7.5 10*3/uL (ref 1.7–7.7)
Neutrophils Relative %: 72 %
Platelet Count: 181 10*3/uL (ref 150–400)
RBC: 3.22 MIL/uL — ABNORMAL LOW (ref 4.22–5.81)
RDW: 14.7 % (ref 11.5–15.5)
WBC Count: 10.4 10*3/uL (ref 4.0–10.5)
nRBC: 0 % (ref 0.0–0.2)

## 2019-05-25 MED ORDER — SODIUM CHLORIDE 0.9% FLUSH
10.0000 mL | INTRAVENOUS | Status: DC | PRN
  Administered 2019-05-25: 10 mL via INTRAVENOUS
  Filled 2019-05-25: qty 10

## 2019-05-25 MED ORDER — ENSURE ENLIVE PO LIQD: 1.0000 | Freq: Three times a day (TID) | ORAL | 5 refills | Status: AC

## 2019-05-25 MED ORDER — HEPARIN SOD (PORK) LOCK FLUSH 100 UNIT/ML IV SOLN
500.0000 [IU] | INTRAVENOUS | Status: DC | PRN
  Administered 2019-05-25: 500 [IU] via INTRAVENOUS
  Filled 2019-05-25: qty 5

## 2019-05-26 ENCOUNTER — Telehealth: Payer: Self-pay | Admitting: Nurse Practitioner

## 2019-05-26 NOTE — Telephone Encounter (Signed)
Scheduled appt per 5/28 los.  Spoke with patient and patient aware of his appt date and time.

## 2019-05-31 ENCOUNTER — Inpatient Hospital Stay: Payer: Medicaid Other | Attending: Hematology | Admitting: Nutrition

## 2019-05-31 DIAGNOSIS — C786 Secondary malignant neoplasm of retroperitoneum and peritoneum: Secondary | ICD-10-CM | POA: Insufficient documentation

## 2019-05-31 DIAGNOSIS — K521 Toxic gastroenteritis and colitis: Secondary | ICD-10-CM | POA: Insufficient documentation

## 2019-05-31 DIAGNOSIS — Z5112 Encounter for antineoplastic immunotherapy: Secondary | ICD-10-CM | POA: Insufficient documentation

## 2019-05-31 DIAGNOSIS — D638 Anemia in other chronic diseases classified elsewhere: Secondary | ICD-10-CM | POA: Insufficient documentation

## 2019-05-31 DIAGNOSIS — Z66 Do not resuscitate: Secondary | ICD-10-CM | POA: Insufficient documentation

## 2019-05-31 DIAGNOSIS — Z86711 Personal history of pulmonary embolism: Secondary | ICD-10-CM | POA: Insufficient documentation

## 2019-05-31 DIAGNOSIS — Z86718 Personal history of other venous thrombosis and embolism: Secondary | ICD-10-CM | POA: Insufficient documentation

## 2019-05-31 DIAGNOSIS — C186 Malignant neoplasm of descending colon: Secondary | ICD-10-CM | POA: Insufficient documentation

## 2019-05-31 DIAGNOSIS — Z933 Colostomy status: Secondary | ICD-10-CM | POA: Insufficient documentation

## 2019-05-31 DIAGNOSIS — Z7901 Long term (current) use of anticoagulants: Secondary | ICD-10-CM | POA: Insufficient documentation

## 2019-05-31 DIAGNOSIS — R05 Cough: Secondary | ICD-10-CM | POA: Insufficient documentation

## 2019-05-31 DIAGNOSIS — G893 Neoplasm related pain (acute) (chronic): Secondary | ICD-10-CM | POA: Insufficient documentation

## 2019-05-31 DIAGNOSIS — Z5111 Encounter for antineoplastic chemotherapy: Secondary | ICD-10-CM | POA: Insufficient documentation

## 2019-05-31 NOTE — Progress Notes (Signed)
RD working remotely.  Contacted patient by telephone. Patient would like one complimentary case of Ensure Enlive to pick up on Friday. He will pick up from the Patient [facility] Family Support area.

## 2019-05-31 NOTE — Progress Notes (Signed)
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  -He iscurrently onXarelto22m once daily  -CT AP from 02/15/19 shows stable blood clots in abdomen.  -He previously had bleeding at ostomy site on 04/07/19. 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 Currently on Ferrous Sulfate. Continue. -Labs reviewed,hemoglobin9.5 today (06/02/19), stable   5. Abdominal pain -He has chronic left upper quadrant abdominal pain, secondary to tumor. 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 He has been coughing lately.  -He takes MS Contin 50m BID and Oxycodone 155m(3-4 times a day) as needed. -He also takes Gabapentin for his leg pain.   6. 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 -Currently on  MirtazapineandProtonix. -He has been able to gain 10 pounds recently  -appetite and eating has improved being off chemo last cycle. Weight stable   7. Diarrhea  -Chemo related. Currently on Imodium and Lomotil as needed. -Has had recent mild constipation with BM every 2 days.  -BMs have been more normal lately. Will monitor   8. Goal of care discussion, DNR/DNI -Wepreviously discussed the incurable nature of his cancer, and the overall poor prognosis, especiallyhis overall condition hasdeterioratedlately -he agreed with DNR/DNI -he was on hospice care for 2-3 weeks in March 2020,he has withdrawn from hospice now.  Plan -Labs reviewed and adequate to proceed with low dose irinotecan 8039m2 and vectibix today  -Lab, flush, f/u and chemo Irinotecan and Vectibix in 2, 4 and 6 weeks (morning)  -I refilled his MS Contin, Protonix, Compazine, and Lomotil today    No problem-specific Assessment & Plan notes found for this encounter.   No orders of the defined types were placed in this encounter.  All questions were answered. The patient knows to call the clinic with any problems, questions or concerns. No barriers to learning was detected. I spent 20 minutes counseling the patient face to face. The total time spent in the appointment was 25 minutes and more than 50% was on counseling and review of test results     YanTruitt MerleD 06/02/2019   I, AmoJoslyn Devonm acting as scribe for YanTruitt MerleD.   I have reviewed the above documentation for accuracy and completeness, and I agree with the above.

## 2019-06-01 ENCOUNTER — Other Ambulatory Visit: Payer: Medicaid Other

## 2019-06-01 ENCOUNTER — Ambulatory Visit: Payer: Medicaid Other | Admitting: Hematology

## 2019-06-02 ENCOUNTER — Inpatient Hospital Stay: Payer: Medicaid Other

## 2019-06-02 ENCOUNTER — Inpatient Hospital Stay (HOSPITAL_BASED_OUTPATIENT_CLINIC_OR_DEPARTMENT_OTHER): Payer: Medicaid Other | Admitting: Hematology

## 2019-06-02 ENCOUNTER — Other Ambulatory Visit: Payer: Self-pay

## 2019-06-02 ENCOUNTER — Encounter: Payer: Self-pay | Admitting: Hematology

## 2019-06-02 ENCOUNTER — Telehealth: Payer: Self-pay | Admitting: Hematology

## 2019-06-02 VITALS — BP 100/72 | HR 102 | Temp 98.0°F | Resp 18 | Ht 69.0 in | Wt 110.0 lb

## 2019-06-02 VITALS — HR 93

## 2019-06-02 DIAGNOSIS — C189 Malignant neoplasm of colon, unspecified: Secondary | ICD-10-CM

## 2019-06-02 DIAGNOSIS — C786 Secondary malignant neoplasm of retroperitoneum and peritoneum: Secondary | ICD-10-CM

## 2019-06-02 DIAGNOSIS — Z933 Colostomy status: Secondary | ICD-10-CM | POA: Diagnosis not present

## 2019-06-02 DIAGNOSIS — Z95828 Presence of other vascular implants and grafts: Secondary | ICD-10-CM

## 2019-06-02 DIAGNOSIS — K521 Toxic gastroenteritis and colitis: Secondary | ICD-10-CM | POA: Diagnosis not present

## 2019-06-02 DIAGNOSIS — Z86718 Personal history of other venous thrombosis and embolism: Secondary | ICD-10-CM

## 2019-06-02 DIAGNOSIS — Z7901 Long term (current) use of anticoagulants: Secondary | ICD-10-CM

## 2019-06-02 DIAGNOSIS — C762 Malignant neoplasm of abdomen: Secondary | ICD-10-CM

## 2019-06-02 DIAGNOSIS — Z5111 Encounter for antineoplastic chemotherapy: Secondary | ICD-10-CM | POA: Diagnosis not present

## 2019-06-02 DIAGNOSIS — C186 Malignant neoplasm of descending colon: Secondary | ICD-10-CM | POA: Diagnosis not present

## 2019-06-02 DIAGNOSIS — D500 Iron deficiency anemia secondary to blood loss (chronic): Secondary | ICD-10-CM

## 2019-06-02 DIAGNOSIS — R05 Cough: Secondary | ICD-10-CM | POA: Diagnosis not present

## 2019-06-02 DIAGNOSIS — D630 Anemia in neoplastic disease: Secondary | ICD-10-CM

## 2019-06-02 DIAGNOSIS — E876 Hypokalemia: Secondary | ICD-10-CM

## 2019-06-02 DIAGNOSIS — Z66 Do not resuscitate: Secondary | ICD-10-CM | POA: Diagnosis not present

## 2019-06-02 DIAGNOSIS — Z86711 Personal history of pulmonary embolism: Secondary | ICD-10-CM | POA: Diagnosis not present

## 2019-06-02 DIAGNOSIS — D638 Anemia in other chronic diseases classified elsewhere: Secondary | ICD-10-CM | POA: Diagnosis not present

## 2019-06-02 DIAGNOSIS — Z5112 Encounter for antineoplastic immunotherapy: Secondary | ICD-10-CM | POA: Diagnosis not present

## 2019-06-02 DIAGNOSIS — G893 Neoplasm related pain (acute) (chronic): Secondary | ICD-10-CM | POA: Diagnosis not present

## 2019-06-02 DIAGNOSIS — D509 Iron deficiency anemia, unspecified: Secondary | ICD-10-CM

## 2019-06-02 LAB — CBC WITH DIFFERENTIAL (CANCER CENTER ONLY)
Abs Immature Granulocytes: 0.04 10*3/uL (ref 0.00–0.07)
Basophils Absolute: 0.1 10*3/uL (ref 0.0–0.1)
Basophils Relative: 1 %
Eosinophils Absolute: 0.2 10*3/uL (ref 0.0–0.5)
Eosinophils Relative: 2 %
HCT: 31.4 % — ABNORMAL LOW (ref 39.0–52.0)
Hemoglobin: 9.5 g/dL — ABNORMAL LOW (ref 13.0–17.0)
Immature Granulocytes: 0 %
Lymphocytes Relative: 15 %
Lymphs Abs: 1.7 10*3/uL (ref 0.7–4.0)
MCH: 27.3 pg (ref 26.0–34.0)
MCHC: 30.3 g/dL (ref 30.0–36.0)
MCV: 90.2 fL (ref 80.0–100.0)
Monocytes Absolute: 0.9 10*3/uL (ref 0.1–1.0)
Monocytes Relative: 8 %
Neutro Abs: 7.9 10*3/uL — ABNORMAL HIGH (ref 1.7–7.7)
Neutrophils Relative %: 74 %
Platelet Count: 158 10*3/uL (ref 150–400)
RBC: 3.48 MIL/uL — ABNORMAL LOW (ref 4.22–5.81)
RDW: 15.3 % (ref 11.5–15.5)
WBC Count: 10.8 10*3/uL — ABNORMAL HIGH (ref 4.0–10.5)
nRBC: 0 % (ref 0.0–0.2)

## 2019-06-02 LAB — CMP (CANCER CENTER ONLY)
ALT: <6 U/L (ref 0–44)
AST: 17 U/L (ref 15–41)
Albumin: 2.7 g/dL — ABNORMAL LOW (ref 3.5–5.0)
Alkaline Phosphatase: 115 U/L (ref 38–126)
Anion gap: 9 (ref 5–15)
BUN: 6 mg/dL (ref 6–20)
CO2: 23 mmol/L (ref 22–32)
Calcium: 8.5 mg/dL — ABNORMAL LOW (ref 8.9–10.3)
Chloride: 105 mmol/L (ref 98–111)
Creatinine: 0.67 mg/dL (ref 0.61–1.24)
GFR, Est AFR Am: >60 mL/min (ref >60)
GFR, Estimated: >60 mL/min (ref >60)
Glucose, Bld: 50 mg/dL — ABNORMAL LOW (ref 70–99)
Potassium: 3.7 mmol/L (ref 3.5–5.1)
Sodium: 137 mmol/L (ref 135–145)
Total Bilirubin: 0.2 mg/dL — ABNORMAL LOW (ref 0.3–1.2)
Total Protein: 8.4 g/dL — ABNORMAL HIGH (ref 6.5–8.1)

## 2019-06-02 LAB — IRON AND TIBC
Iron: 32 ug/dL — ABNORMAL LOW (ref 42–163)
Saturation Ratios: 19 % — ABNORMAL LOW (ref 20–55)
TIBC: 171 ug/dL — ABNORMAL LOW (ref 202–409)
UIBC: 139 ug/dL (ref 117–376)

## 2019-06-02 LAB — MAGNESIUM: Magnesium: 1.5 mg/dL — ABNORMAL LOW (ref 1.7–2.4)

## 2019-06-02 MED ORDER — PANTOPRAZOLE SODIUM 40 MG PO TBEC: 40.0000 mg | DELAYED_RELEASE_TABLET | Freq: Every day | ORAL | 1 refills | Status: DC

## 2019-06-02 MED ORDER — PROCHLORPERAZINE MALEATE 10 MG PO TABS: 10.0000 mg | ORAL_TABLET | Freq: Four times a day (QID) | ORAL | 3 refills | Status: AC | PRN

## 2019-06-02 MED ORDER — IRINOTECAN HCL CHEMO INJECTION 100 MG/5ML: 80.0000 mg/m2 | Freq: Once | INTRAVENOUS | Status: DC

## 2019-06-02 MED ORDER — PALONOSETRON HCL INJECTION 0.25 MG/5ML
0.2500 mg | Freq: Once | INTRAVENOUS | Status: AC
  Administered 2019-06-02: 11:00:00 0.25 mg via INTRAVENOUS

## 2019-06-02 MED ORDER — SODIUM CHLORIDE 0.9 % IV SOLN
6.0000 mg/kg | Freq: Once | INTRAVENOUS | Status: AC
  Administered 2019-06-02: 300 mg via INTRAVENOUS
  Filled 2019-06-02: qty 15

## 2019-06-02 MED ORDER — HEPARIN SOD (PORK) LOCK FLUSH 100 UNIT/ML IV SOLN
500.0000 [IU] | Freq: Once | INTRAVENOUS | Status: AC | PRN
  Administered 2019-06-02: 15:00:00 500 [IU]
  Filled 2019-06-02: qty 5

## 2019-06-02 MED ORDER — DIPHENOXYLATE-ATROPINE 2.5-0.025 MG PO TABS: 2.0000 | ORAL_TABLET | Freq: Four times a day (QID) | ORAL | 1 refills | Status: DC | PRN

## 2019-06-02 MED ORDER — ATROPINE SULFATE 0.4 MG/ML IJ SOLN
INTRAMUSCULAR | Status: AC
  Filled 2019-06-02: qty 1

## 2019-06-02 MED ORDER — DEXAMETHASONE SODIUM PHOSPHATE 10 MG/ML IJ SOLN
INTRAMUSCULAR | Status: AC
  Filled 2019-06-02: qty 1

## 2019-06-02 MED ORDER — DEXAMETHASONE SODIUM PHOSPHATE 10 MG/ML IJ SOLN
10.0000 mg | Freq: Once | INTRAMUSCULAR | Status: AC
  Administered 2019-06-02: 11:00:00 10 mg via INTRAVENOUS

## 2019-06-02 MED ORDER — PALONOSETRON HCL INJECTION 0.25 MG/5ML
INTRAVENOUS | Status: AC
  Filled 2019-06-02: qty 5

## 2019-06-02 MED ORDER — SODIUM CHLORIDE 0.9% FLUSH
10.0000 mL | INTRAVENOUS | Status: DC | PRN
  Administered 2019-06-02: 10 mL
  Filled 2019-06-02: qty 10

## 2019-06-02 MED ORDER — SODIUM CHLORIDE 0.9 % IV SOLN
Freq: Once | INTRAVENOUS | Status: AC
  Administered 2019-06-02: 11:00:00 via INTRAVENOUS
  Filled 2019-06-02: qty 250

## 2019-06-02 MED ORDER — MORPHINE SULFATE ER 60 MG PO TBCR: 60.0000 mg | EXTENDED_RELEASE_TABLET | Freq: Two times a day (BID) | ORAL | 0 refills | Status: DC

## 2019-06-02 MED ORDER — RIVAROXABAN 15 MG PO TABS: 15.0000 mg | ORAL_TABLET | Freq: Every day | ORAL | 3 refills | Status: DC

## 2019-06-02 MED ORDER — IRINOTECAN HCL CHEMO INJECTION 100 MG/5ML
80.0000 mg/m2 | Freq: Once | INTRAVENOUS | Status: AC
  Administered 2019-06-02: 13:00:00 120 mg via INTRAVENOUS
  Filled 2019-06-02: qty 6

## 2019-06-02 MED ORDER — ATROPINE SULFATE 1 MG/ML IJ SOLN: 0.5000 mg | Freq: Once | INTRAMUSCULAR | Status: DC | PRN

## 2019-06-02 MED ORDER — ATROPINE SULFATE 0.4 MG/ML IJ SOLN
0.4000 mg | Freq: Once | INTRAMUSCULAR | Status: AC | PRN
  Administered 2019-06-02: 13:00:00 0.4 mg via INTRAVENOUS

## 2019-06-02 MED ORDER — SODIUM CHLORIDE 0.9% FLUSH
10.0000 mL | INTRAVENOUS | Status: DC | PRN
  Administered 2019-06-02: 10 mL via INTRAVENOUS
  Filled 2019-06-02: qty 10

## 2019-06-02 MED ORDER — MAGNESIUM SULFATE 2 GM/50ML IV SOLN
2.0000 g | Freq: Once | INTRAVENOUS | Status: AC
  Administered 2019-06-02: 2 g via INTRAVENOUS
  Filled 2019-06-02: qty 50

## 2019-06-02 MED FILL — DIPHENOXYLATE-ATROPINE 2.5-: 2.5-0.025 | 11 days supply | Qty: 90 | Fill #0

## 2019-06-02 MED FILL — PANTOPRAZOLE SOD DR 40 MG T: 40 | 30 days supply | Qty: 30 | Fill #0

## 2019-06-02 MED FILL — XARELTO 15 MG TABLET: 15 | 30 days supply | Qty: 30 | Fill #0

## 2019-06-02 MED FILL — MORPHINE SULF 60 MG TAB SA: 60 | 30 days supply | Qty: 60 | Fill #0

## 2019-06-02 MED FILL — PROCHLORPERAZINE 10 MG TAB: 10 | 7 days supply | Qty: 30 | Fill #0

## 2019-06-02 NOTE — Progress Notes (Signed)
06/02/19  Adjust dose of Irinotecan with new weight due to >10% dose change.  Also giving Magnesium Sulfate 2 gm IVPB for Mag level. 1.5.  Orders modified.  V.O. Dr Lavonda Jumbo, PharmD

## 2019-06-02 NOTE — Patient Instructions (Signed)

## 2019-06-02 NOTE — Telephone Encounter (Signed)
Scheduled appt per 6/5 los. Patient will get a print out at his next appt.

## 2019-06-02 NOTE — Patient Instructions (Signed)
Coronavirus (COVID-19) Are you at risk?  Are you at risk for the Coronavirus (COVID-19)?  To be considered HIGH RISK for Coronavirus (COVID-19), you have to meet the following criteria:  . Traveled to [place], [place], [place], [place] or [place]; or in the [place] to [place], [place], [place], or [place]; and have fever, cough, and shortness of breath within the last 2 weeks of travel OR . Been in close contact with a person diagnosed with COVID-19 within the last 2 weeks and have fever, cough, and shortness of breath . IF YOU DO NOT MEET THESE CRITERIA, YOU ARE CONSIDERED LOW RISK FOR COVID-19.  What to do if you are HIGH RISK for COVID-19?  . If you are having a medical emergency, call 911. . Seek medical care right away. Before you go to a doctor's office, urgent care or emergency department, call ahead and tell them about your recent travel, contact with someone diagnosed with COVID-19, and your symptoms. You should receive instructions from your physician's office regarding next steps of care.  . When you arrive at healthcare provider, tell the healthcare staff immediately you have returned from visiting [place], [place], [place], [place] or [place]; or traveled in the [place] to [place], [place], [place], or [place]; in the last two weeks or you have been in close contact with a person diagnosed with COVID-19 in the last 2 weeks.   . Tell the health care staff about your symptoms: fever, cough and shortness of breath. . After you have been seen by a medical provider, you will be either: o Tested for (COVID-19) and discharged home on quarantine except to seek medical care if symptoms worsen, and asked to  - Stay home and avoid contact with others until you get your results (4-5 days)  - Avoid travel on public transportation if possible (such as bus, train, or airplane) or o Sent to the Emergency Department by EMS for evaluation, COVID-19 testing, and possible  admission depending on your condition and test results.  What to do if you are LOW RISK for COVID-19?  Reduce your risk of any infection by using the same precautions used for avoiding the common cold or flu:  . Wash your hands often with soap and warm water for at least 20 seconds.  If soap and water are not readily available, use an alcohol-based hand sanitizer with at least 60% alcohol.  . If coughing or sneezing, cover your mouth and nose by coughing or sneezing into the elbow areas of your shirt or coat, into a tissue or into your sleeve (not your hands). . Avoid shaking hands with others and consider head nods or verbal greetings only. . Avoid touching your eyes, nose, or mouth with unwashed hands.  . Avoid close contact with people who are sick. . Avoid places or events with large numbers of people in one location, like concerts or sporting events. . Carefully consider travel plans you have or are making. . If you are planning any travel outside or inside the [place], visit the CDC's Travelers' Health webpage for the latest health notices. . If you have some symptoms but not all symptoms, continue to monitor at home and seek medical attention if your symptoms worsen. . If you are having a medical emergency, call 911.   ADDITIONAL HEALTHCARE OPTIONS FOR PATIENTS  [facility] / e-Visit: [email or web address]         [facility]: [phone]  Onset   Urgent Care: [facility]: [place] Discharge Instructions for Patients Receiving Chemotherapy  Today you received the following chemotherapy agents Vectibix and Irinotecan  To help prevent nausea and vomiting after your treatment, we encourage you to take your nausea medication as directed.    If you develop nausea and vomiting that is not controlled by your nausea medication, call the clinic.    BELOW ARE SYMPTOMS THAT SHOULD BE REPORTED IMMEDIATELY:  *FEVER GREATER THAN 100.5 F  *CHILLS WITH OR WITHOUT FEVER  NAUSEA AND VOMITING THAT IS NOT CONTROLLED WITH YOUR NAUSEA MEDICATION  *UNUSUAL SHORTNESS OF BREATH  *UNUSUAL BRUISING OR BLEEDING  TENDERNESS IN MOUTH AND THROAT WITH OR WITHOUT PRESENCE OF ULCERS  *URINARY PROBLEMS  *BOWEL PROBLEMS  UNUSUAL RASH Items with * indicate a potential emergency and should be followed up as soon as possible.  Feel free to call the clinic should you have any questions or concerns. The clinic phone number is [phone].  Please show the [facility] at check-in to the Emergency Department and triage nurse.

## 2019-06-06 ENCOUNTER — Telehealth: Payer: Self-pay | Admitting: *Deleted

## 2019-06-06 NOTE — Telephone Encounter (Signed)
Received vm call from daughter, [name] stating pt not feeling well & having chills & back bothering him like it did when he had pneumonia.  Returned call & daughter states pt outside to get warm, no shaking chills but has goosebumps, coughing up dark green phlegm, denies SOB, colostomy filling up with water & has emptied multiple times today.  He has had 2 ensures today but not much else.  Encouraged to increase oral clear liquids & hold on ensure for now & take lomotil up to 8/d.  He states he has taken 2 tabs twice today.  She checked his temp while nurse on line & temp 98.5.  Encouraged to call On-Call RN/MD if temp continues to rise or diarrhea doesn't stop & unable to get in fluids & may need to got to ED.  He just finished ATB couple of weeks ago.  She expressed understanding. Message routed to Dr Burr Medico.

## 2019-06-06 NOTE — Telephone Encounter (Signed)
I will let my desk nurse check on him again tomorrow to see if he needs to be seen. Thanks   [name] MD

## 2019-06-07 ENCOUNTER — Other Ambulatory Visit: Payer: Self-pay

## 2019-06-07 ENCOUNTER — Inpatient Hospital Stay: Payer: Medicaid Other

## 2019-06-07 ENCOUNTER — Other Ambulatory Visit: Payer: Self-pay | Admitting: *Deleted

## 2019-06-07 ENCOUNTER — Telehealth: Payer: Self-pay | Admitting: *Deleted

## 2019-06-07 ENCOUNTER — Inpatient Hospital Stay (HOSPITAL_BASED_OUTPATIENT_CLINIC_OR_DEPARTMENT_OTHER): Payer: Medicaid Other | Admitting: Medical

## 2019-06-07 ENCOUNTER — Ambulatory Visit (HOSPITAL_COMMUNITY)
Admission: RE | Admit: 2019-06-07 | Discharge: 2019-06-07 | Disposition: A | Discharge status: Home / Self Care | Payer: Medicaid Other | Source: Ambulatory Visit | Attending: Medical | Admitting: Medical

## 2019-06-07 VITALS — BP 92/70 | HR 88 | Temp 98.1°F | Resp 18 | Ht 69.0 in | Wt 103.6 lb

## 2019-06-07 DIAGNOSIS — R05 Cough: Secondary | ICD-10-CM | POA: Diagnosis present

## 2019-06-07 DIAGNOSIS — C189 Malignant neoplasm of colon, unspecified: Secondary | ICD-10-CM

## 2019-06-07 DIAGNOSIS — R059 Cough, unspecified: Secondary | ICD-10-CM

## 2019-06-07 DIAGNOSIS — D500 Iron deficiency anemia secondary to blood loss (chronic): Secondary | ICD-10-CM

## 2019-06-07 DIAGNOSIS — K521 Toxic gastroenteritis and colitis: Secondary | ICD-10-CM

## 2019-06-07 DIAGNOSIS — C786 Secondary malignant neoplasm of retroperitoneum and peritoneum: Secondary | ICD-10-CM

## 2019-06-07 DIAGNOSIS — C186 Malignant neoplasm of descending colon: Secondary | ICD-10-CM

## 2019-06-07 DIAGNOSIS — Z5112 Encounter for antineoplastic immunotherapy: Secondary | ICD-10-CM | POA: Diagnosis not present

## 2019-06-07 DIAGNOSIS — Z95828 Presence of other vascular implants and grafts: Secondary | ICD-10-CM

## 2019-06-07 LAB — CBC WITH DIFFERENTIAL (CANCER CENTER ONLY)
Abs Immature Granulocytes: 0.08 10*3/uL — ABNORMAL HIGH (ref 0.00–0.07)
Basophils Absolute: 0.1 10*3/uL (ref 0.0–0.1)
Basophils Relative: 1 %
Eosinophils Absolute: 0.2 10*3/uL (ref 0.0–0.5)
Eosinophils Relative: 2 %
HCT: 34.1 % — ABNORMAL LOW (ref 39.0–52.0)
Hemoglobin: 10.3 g/dL — ABNORMAL LOW (ref 13.0–17.0)
Immature Granulocytes: 1 %
Lymphocytes Relative: 24 %
Lymphs Abs: 1.9 10*3/uL (ref 0.7–4.0)
MCH: 26.8 pg (ref 26.0–34.0)
MCHC: 30.2 g/dL (ref 30.0–36.0)
MCV: 88.6 fL (ref 80.0–100.0)
Monocytes Absolute: 0.3 10*3/uL (ref 0.1–1.0)
Monocytes Relative: 4 %
Neutro Abs: 5.3 10*3/uL (ref 1.7–7.7)
Neutrophils Relative %: 68 %
Platelet Count: 165 10*3/uL (ref 150–400)
RBC: 3.85 MIL/uL — ABNORMAL LOW (ref 4.22–5.81)
RDW: 14.7 % (ref 11.5–15.5)
WBC Count: 7.8 10*3/uL (ref 4.0–10.5)
nRBC: 0 % (ref 0.0–0.2)

## 2019-06-07 LAB — CMP (CANCER CENTER ONLY)
ALT: <6 U/L (ref 0–44)
AST: 13 U/L — ABNORMAL LOW (ref 15–41)
Albumin: 3 g/dL — ABNORMAL LOW (ref 3.5–5.0)
Alkaline Phosphatase: 102 U/L (ref 38–126)
Anion gap: 11 (ref 5–15)
BUN: 8 mg/dL (ref 6–20)
CO2: 22 mmol/L (ref 22–32)
Calcium: 9 mg/dL (ref 8.9–10.3)
Chloride: 101 mmol/L (ref 98–111)
Creatinine: 0.71 mg/dL (ref 0.61–1.24)
GFR, Est AFR Am: >60 mL/min (ref >60)
GFR, Estimated: >60 mL/min (ref >60)
Glucose, Bld: 113 mg/dL — ABNORMAL HIGH (ref 70–99)
Potassium: 4.2 mmol/L (ref 3.5–5.1)
Sodium: 134 mmol/L — ABNORMAL LOW (ref 135–145)
Total Bilirubin: 0.2 mg/dL — ABNORMAL LOW (ref 0.3–1.2)
Total Protein: 8.9 g/dL — ABNORMAL HIGH (ref 6.5–8.1)

## 2019-06-07 LAB — MAGNESIUM: Magnesium: 1.6 mg/dL — ABNORMAL LOW (ref 1.7–2.4)

## 2019-06-07 MED ORDER — OXYCODONE HCL 10 MG PO TABS: 10.0000 mg | ORAL_TABLET | ORAL | 0 refills | Status: DC | PRN

## 2019-06-07 MED ORDER — SODIUM CHLORIDE 0.9% FLUSH
10.0000 mL | INTRAVENOUS | Status: DC | PRN
  Administered 2019-06-07: 10 mL via INTRAVENOUS
  Filled 2019-06-07: qty 10

## 2019-06-07 MED ORDER — HEPARIN SOD (PORK) LOCK FLUSH 100 UNIT/ML IV SOLN
500.0000 [IU] | INTRAVENOUS | Status: DC | PRN
  Administered 2019-06-07: 500 [IU] via INTRAVENOUS
  Filled 2019-06-07: qty 5

## 2019-06-07 MED ORDER — AMOXICILLIN-POT CLAVULANATE 875-125 MG PO TABS: 1.0000 | ORAL_TABLET | Freq: Two times a day (BID) | ORAL | 0 refills | Status: DC

## 2019-06-07 MED FILL — oxyCODONE HCL 10 MG TABS: 10 | 20 days supply | Qty: 120 | Fill #0

## 2019-06-07 MED FILL — AMOX-CLAV 875-125 MG TABLET: 875-125 | 7 days supply | Qty: 14 | Fill #0

## 2019-06-07 NOTE — Telephone Encounter (Signed)
Notified by Apolonio Schneiders, at the front desk that pt is in the lobby. His daughter thinks he has an appt today.  Reviewed notes from yesterday. Pt's daughter was to call about getting an [facility] appt today.  Spoke with Sandi Mealy, PA in [facility]. [place] for labs and visit today. [facility] scheduling request sent. Lab orders placed  Apolonio Schneiders made aware at front that pt will be seen today  See note from Jesse Fall, RN from 6/9.20

## 2019-06-07 NOTE — Progress Notes (Signed)
Pt seen by PA Van only, no RN assessment at this time.  PA aware. 

## 2019-06-07 NOTE — Telephone Encounter (Signed)
I called pt, he is not doing well, I recommend him to come in and see [place]. He will call his daughter and let us know if she can bring him in today, See [facility] or [facility].   [name] MD

## 2019-06-07 NOTE — Patient Instructions (Signed)
Chest X-Ray  A chest X-ray is a painless test that uses radiation to create images of the structures inside of your chest. Chest X-rays are used to look for many health conditions, including heart failure, pneumonia, tuberculosis, rib fractures, breathing disorders, and cancer. They may be used to diagnose chest pain, constant coughing, or trouble breathing. Tell a health care provider about:  Any allergies you have.  All medicines you are taking, including vitamins, herbs, eye drops, creams, and over-the-counter medicines.  Any surgeries you have had.  Any medical conditions you have.  Whether you are pregnant or may be pregnant. What are the risks? Getting a chest X-ray is a safe procedure. However, you will be exposed to a small amount of radiation. Being exposed to too much radiation over a lifetime can increase the risk of cancer. This risk is small, but it may occur if you have many X-rays throughout your life. What happens before the procedure?  You may be asked to remove glasses, jewelry, and any other metal objects.  You will be asked to undress from the waist up. You may be given a hospital gown to wear.  You may be asked to wear a protective lead apron to protect parts of your body from radiation. What happens during the procedure?  You will be asked to stand still as each picture is taken to get the best possible images.  You will be asked to take a deep breath and hold your breath for a few seconds.  The X-ray machine will create a picture of your chest using a tiny burst of radiation. This is painless.  More pictures may be taken from other angles. Typically, one picture will be taken while you face the X-ray camera, and another picture will be taken from the side while you stand. If you cannot stand, you may be asked to lie down. The procedure may vary among health care providers and hospitals. What happens after the procedure?  The X-ray(s) will be reviewed by your  health care provider or an X-ray (radiology) specialist.  It is up to you to get your test results. Ask your health care provider, or the department that is doing the test, when your results will be ready.  Your health care provider will tell you if you need more tests or a follow-up exam. Keep all follow-up visits as told by your health care provider. This is important. Summary  A chest X-ray is a safe, painless test that is used to examine the inside of the chest, heart, and lungs.  You will need to undress from the waist up and remove jewelry and metal objects before the procedure.  You will be exposed to a small amount of radiation during the procedure.  The X-ray machine will take one or more pictures of your chest while you remain as still as possible.  Later, a health care provider or specialist will review the test results with you. This information is not intended to replace advice given to you by your health care provider. Make sure you discuss any questions you have with your health care provider. Document Released: 02/09/2017 Document Revised: 02/09/2017 Document Reviewed: 02/09/2017 Elsevier Interactive Patient Education  2019 [employer]  
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
## 2019-06-07 NOTE — Telephone Encounter (Signed)
Yes, I called her and asked him to come in to see [name] or [name].   [name] MD

## 2019-06-11 MED FILL — MIRTAZAPINE 15 MG TABLET: 15 | 30 days supply | Qty: 30 | Fill #2

## 2019-06-15 ENCOUNTER — Inpatient Hospital Stay (HOSPITAL_BASED_OUTPATIENT_CLINIC_OR_DEPARTMENT_OTHER): Payer: Medicaid Other | Admitting: Nurse Practitioner

## 2019-06-15 ENCOUNTER — Inpatient Hospital Stay: Payer: Medicaid Other

## 2019-06-15 ENCOUNTER — Encounter: Payer: Self-pay | Admitting: Nurse Practitioner

## 2019-06-15 ENCOUNTER — Other Ambulatory Visit: Payer: Self-pay

## 2019-06-15 VITALS — BP 91/75 | HR 89 | Temp 98.9°F | Resp 18 | Ht 69.0 in | Wt 105.0 lb

## 2019-06-15 DIAGNOSIS — K521 Toxic gastroenteritis and colitis: Secondary | ICD-10-CM

## 2019-06-15 DIAGNOSIS — Z95828 Presence of other vascular implants and grafts: Secondary | ICD-10-CM

## 2019-06-15 DIAGNOSIS — G893 Neoplasm related pain (acute) (chronic): Secondary | ICD-10-CM

## 2019-06-15 DIAGNOSIS — C186 Malignant neoplasm of descending colon: Secondary | ICD-10-CM | POA: Diagnosis not present

## 2019-06-15 DIAGNOSIS — C762 Malignant neoplasm of abdomen: Secondary | ICD-10-CM

## 2019-06-15 DIAGNOSIS — Z933 Colostomy status: Secondary | ICD-10-CM

## 2019-06-15 DIAGNOSIS — C189 Malignant neoplasm of colon, unspecified: Secondary | ICD-10-CM

## 2019-06-15 DIAGNOSIS — C786 Secondary malignant neoplasm of retroperitoneum and peritoneum: Secondary | ICD-10-CM

## 2019-06-15 DIAGNOSIS — Z5112 Encounter for antineoplastic immunotherapy: Secondary | ICD-10-CM | POA: Diagnosis not present

## 2019-06-15 DIAGNOSIS — Z7901 Long term (current) use of anticoagulants: Secondary | ICD-10-CM

## 2019-06-15 DIAGNOSIS — Z86718 Personal history of other venous thrombosis and embolism: Secondary | ICD-10-CM

## 2019-06-15 DIAGNOSIS — D630 Anemia in neoplastic disease: Secondary | ICD-10-CM

## 2019-06-15 DIAGNOSIS — D638 Anemia in other chronic diseases classified elsewhere: Secondary | ICD-10-CM | POA: Diagnosis not present

## 2019-06-15 DIAGNOSIS — D509 Iron deficiency anemia, unspecified: Secondary | ICD-10-CM

## 2019-06-15 DIAGNOSIS — D500 Iron deficiency anemia secondary to blood loss (chronic): Secondary | ICD-10-CM

## 2019-06-15 DIAGNOSIS — Z86711 Personal history of pulmonary embolism: Secondary | ICD-10-CM

## 2019-06-15 LAB — CMP (CANCER CENTER ONLY)
ALT: 7 U/L (ref 0–44)
AST: 16 U/L (ref 15–41)
Albumin: 3 g/dL — ABNORMAL LOW (ref 3.5–5.0)
Alkaline Phosphatase: 104 U/L (ref 38–126)
Anion gap: 7 (ref 5–15)
BUN: 7 mg/dL (ref 6–20)
CO2: 25 mmol/L (ref 22–32)
Calcium: 9 mg/dL (ref 8.9–10.3)
Chloride: 104 mmol/L (ref 98–111)
Creatinine: 0.71 mg/dL (ref 0.61–1.24)
GFR, Est AFR Am: >60 mL/min (ref >60)
GFR, Estimated: >60 mL/min (ref >60)
Glucose, Bld: 89 mg/dL (ref 70–99)
Potassium: 4.6 mmol/L (ref 3.5–5.1)
Sodium: 136 mmol/L (ref 135–145)
Total Bilirubin: <0.2 mg/dL — ABNORMAL LOW (ref 0.3–1.2)
Total Protein: 8.7 g/dL — ABNORMAL HIGH (ref 6.5–8.1)

## 2019-06-15 LAB — CBC WITH DIFFERENTIAL (CANCER CENTER ONLY)
Abs Immature Granulocytes: 0.03 10*3/uL (ref 0.00–0.07)
Basophils Absolute: 0.1 10*3/uL (ref 0.0–0.1)
Basophils Relative: 1 %
Eosinophils Absolute: 0.2 10*3/uL (ref 0.0–0.5)
Eosinophils Relative: 2 %
HCT: 32 % — ABNORMAL LOW (ref 39.0–52.0)
Hemoglobin: 9.8 g/dL — ABNORMAL LOW (ref 13.0–17.0)
Immature Granulocytes: 0 %
Lymphocytes Relative: 21 %
Lymphs Abs: 1.7 10*3/uL (ref 0.7–4.0)
MCH: 26.8 pg (ref 26.0–34.0)
MCHC: 30.6 g/dL (ref 30.0–36.0)
MCV: 87.4 fL (ref 80.0–100.0)
Monocytes Absolute: 0.5 10*3/uL (ref 0.1–1.0)
Monocytes Relative: 7 %
Neutro Abs: 5.6 10*3/uL (ref 1.7–7.7)
Neutrophils Relative %: 69 %
Platelet Count: 163 10*3/uL (ref 150–400)
RBC: 3.66 MIL/uL — ABNORMAL LOW (ref 4.22–5.81)
RDW: 14.7 % (ref 11.5–15.5)
WBC Count: 8.1 10*3/uL (ref 4.0–10.5)
nRBC: 0 % (ref 0.0–0.2)

## 2019-06-15 LAB — CEA (IN HOUSE-CHCC): CEA (CHCC-In House): 47.85 ng/mL — ABNORMAL HIGH (ref 0.00–5.00)

## 2019-06-15 LAB — MAGNESIUM: Magnesium: 1.4 mg/dL — CL (ref 1.7–2.4)

## 2019-06-15 LAB — FERRITIN: Ferritin: 626 ng/mL — ABNORMAL HIGH (ref 24–336)

## 2019-06-15 MED ORDER — HEPARIN SOD (PORK) LOCK FLUSH 100 UNIT/ML IV SOLN
500.0000 [IU] | Freq: Once | INTRAVENOUS | Status: AC | PRN
  Administered 2019-06-15: 500 [IU]
  Filled 2019-06-15: qty 5

## 2019-06-15 MED ORDER — SODIUM CHLORIDE 0.9% FLUSH
10.0000 mL | INTRAVENOUS | Status: DC | PRN
  Administered 2019-06-15: 12:00:00 10 mL via INTRAVENOUS
  Filled 2019-06-15: qty 10

## 2019-06-15 MED ORDER — SODIUM CHLORIDE 0.9% FLUSH
10.0000 mL | INTRAVENOUS | Status: DC | PRN
  Administered 2019-06-15: 10 mL
  Filled 2019-06-15: qty 10

## 2019-06-15 MED ORDER — SODIUM CHLORIDE 0.9 % IV SOLN
6.0000 mg/kg | Freq: Once | INTRAVENOUS | Status: AC
  Administered 2019-06-15: 300 mg via INTRAVENOUS
  Filled 2019-06-15: qty 15

## 2019-06-15 MED ORDER — SODIUM CHLORIDE 0.9 % IV SOLN
Freq: Once | INTRAVENOUS | Status: AC
  Administered 2019-06-15: 14:00:00 via INTRAVENOUS
  Filled 2019-06-15: qty 250

## 2019-06-15 MED ORDER — SODIUM CHLORIDE 0.9 % IV SOLN
Freq: Once | INTRAVENOUS | Status: AC
  Administered 2019-06-15: 14:00:00 via INTRAVENOUS
  Filled 2019-06-15: qty 500

## 2019-06-15 NOTE — Progress Notes (Signed)
Vectibix only today. No Irinotecan. No premeds needed today. Orders removed as requested. Magnesium 4gm in NS 533mL over 2 hours for Magnesium 1.4. Orders entered.  Hardie Pulley, PharmD, BCPS, BCOP

## 2019-06-15 NOTE — Progress Notes (Signed)
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 baseline this week but hesitant to get chemo today -will proceed with panitumumab only today  7. Goal of care discussion, DNR/DNI, on Hospice in March but PS improved and un-enrolled  PLAN: -Labs reviewed, CEA and ferritin pending  -Proceed with panitumumab only today -Mg replacement with 4 g in 500 cc NS over 2 hours per pharmacy, continue 1 tab BID  -F/u in 2 weeks with next cycle   All questions were answered. The patient knows to call the clinic with any problems, questions or concerns. No barriers to learning was detected.     Alla Feeling, NP 06/15/19

## 2019-06-15 NOTE — Patient Instructions (Signed)
[facility] Discharge Instructions for Patients Receiving Chemotherapy  Today you received the following chemotherapy agents: Vectibix.  To help prevent nausea and vomiting after your treatment, we encourage you to take your nausea medication as directed.   If you develop nausea and vomiting that is not controlled by your nausea medication, call the clinic.   BELOW ARE SYMPTOMS THAT SHOULD BE REPORTED IMMEDIATELY:  *FEVER GREATER THAN 100.5 F  *CHILLS WITH OR WITHOUT FEVER  NAUSEA AND VOMITING THAT IS NOT CONTROLLED WITH YOUR NAUSEA MEDICATION  *UNUSUAL SHORTNESS OF BREATH  *UNUSUAL BRUISING OR BLEEDING  TENDERNESS IN MOUTH AND THROAT WITH OR WITHOUT PRESENCE OF ULCERS  *URINARY PROBLEMS  *BOWEL PROBLEMS  UNUSUAL RASH Items with * indicate a potential emergency and should be followed up as soon as possible.  Feel free to call the clinic should you have any questions or concerns. The clinic phone number is [phone].  Please show the CHEMO ALERT CARD at check-in to the Emergency Department and triage nurse.   

## 2019-06-16 ENCOUNTER — Telehealth: Payer: Self-pay | Admitting: Nurse Practitioner

## 2019-06-16 NOTE — Telephone Encounter (Signed)
Scheduled appt per 6/18 los. °

## 2019-06-20 ENCOUNTER — Other Ambulatory Visit: Payer: Self-pay | Admitting: Nurse Practitioner

## 2019-06-20 ENCOUNTER — Telehealth: Payer: Self-pay | Admitting: *Deleted

## 2019-06-20 DIAGNOSIS — C189 Malignant neoplasm of colon, unspecified: Secondary | ICD-10-CM

## 2019-06-20 MED ORDER — OXYCODONE HCL 10 MG PO TABS: 10.0000 mg | ORAL_TABLET | ORAL | 0 refills | Status: DC | PRN

## 2019-06-20 NOTE — Telephone Encounter (Signed)
Spoke with pt and was informed that he needed refill of Oxycodone.  Pt uses [facility]. Pt stated he takes 5 tabs per day, and will be out of med by tomorrow. Pt's   Phone     [phone].

## 2019-06-20 NOTE — Telephone Encounter (Signed)
I refilled. Thanks, [name]

## 2019-06-24 MED FILL — oxyCODONE HCL 10 MG TABS: 10 | 20 days supply | Qty: 120 | Fill #0

## 2019-06-27 NOTE — Progress Notes (Signed)
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  -Healsotakes Gabapentin for his leg pain.  6. 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 -Currently on MirtazapineandProtonix. -He has been able to gain 10 pounds recently  -appetite and eating has improved being off chemo last cycle. Weight overall stable -He has been taking 3-4 ensure daily, I encouraged him to continue   7. Goal of care discussion, DNR/DNI -Wepreviouslydiscussed the incurable nature of his cancer, and the overall poor prognosis, especiallyhis overall condition hasdeterioratedlately -he agreed with DNR/DNI -he was on hospice care for 2-3 weeks in March 2020,he has withdrawn from hospice now.  Plan -Labs reviewed and adequate to proceed with vectibix today, I have stopped his irinotecan due to poor tolerance  -Lab, flush, f/u and Vectibix in 2 weeks -restaging CT CAP with contrast in 3-4 weeks     No problem-specific Assessment & Plan notes found for this encounter.   Orders  Placed This Encounter  Procedures   CT Abdomen Pelvis W Contrast    Standing Status:   Future    Standing Expiration Date:   06/28/2020    Order Specific Question:   If indicated for the ordered procedure, I authorize the administration of contrast media per Radiology protocol    Answer:   Yes    Order Specific Question:   Preferred imaging location?    Answer:   [facility]    Order Specific Question:   Is Oral Contrast requested for this exam?    Answer:   Yes, Per Radiology protocol    Order Specific Question:   Radiology Contrast Protocol - do NOT remove file path    Answer:   [email or web address]   CT Chest W Contrast    Standing Status:   Future    Standing Expiration Date:   06/28/2020    Order Specific Question:   If indicated for the ordered procedure, I authorize the administration of contrast media per Radiology protocol    Answer:   Yes    Order Specific Question:   Preferred imaging location?    Answer:   [facility]    Order Specific Question:   Radiology Contrast Protocol - do NOT remove file path    Answer:   [email or web address]   All questions were answered. The patient knows to call the clinic with any problems, questions or concerns. No barriers to learning was detected. I spent 20 minutes counseling the patient face to face. The total time spent in the appointment was 25 minutes and more than 50% was on counseling and review of test results     [name], MD 06/29/2019   I, Joslyn Devon, am acting as scribe for [name], MD.   I have reviewed the above documentation for accuracy and completeness, and I agree with the above.

## 2019-06-29 ENCOUNTER — Inpatient Hospital Stay (HOSPITAL_BASED_OUTPATIENT_CLINIC_OR_DEPARTMENT_OTHER): Payer: Medicaid Other | Admitting: Hematology

## 2019-06-29 ENCOUNTER — Inpatient Hospital Stay: Payer: Medicaid Other | Attending: Hematology

## 2019-06-29 ENCOUNTER — Inpatient Hospital Stay: Payer: Medicaid Other

## 2019-06-29 ENCOUNTER — Telehealth: Payer: Self-pay | Admitting: Hematology

## 2019-06-29 ENCOUNTER — Encounter: Payer: Self-pay | Admitting: Hematology

## 2019-06-29 ENCOUNTER — Other Ambulatory Visit: Payer: Self-pay

## 2019-06-29 VITALS — BP 106/81 | HR 79 | Temp 97.7°F | Resp 16 | Wt 102.9 lb

## 2019-06-29 DIAGNOSIS — Z7901 Long term (current) use of anticoagulants: Secondary | ICD-10-CM

## 2019-06-29 DIAGNOSIS — Z79899 Other long term (current) drug therapy: Secondary | ICD-10-CM

## 2019-06-29 DIAGNOSIS — Z86718 Personal history of other venous thrombosis and embolism: Secondary | ICD-10-CM

## 2019-06-29 DIAGNOSIS — C189 Malignant neoplasm of colon, unspecified: Secondary | ICD-10-CM

## 2019-06-29 DIAGNOSIS — D638 Anemia in other chronic diseases classified elsewhere: Secondary | ICD-10-CM

## 2019-06-29 DIAGNOSIS — C186 Malignant neoplasm of descending colon: Secondary | ICD-10-CM | POA: Diagnosis not present

## 2019-06-29 DIAGNOSIS — D630 Anemia in neoplastic disease: Secondary | ICD-10-CM

## 2019-06-29 DIAGNOSIS — Z66 Do not resuscitate: Secondary | ICD-10-CM | POA: Insufficient documentation

## 2019-06-29 DIAGNOSIS — Z5112 Encounter for antineoplastic immunotherapy: Secondary | ICD-10-CM | POA: Insufficient documentation

## 2019-06-29 DIAGNOSIS — Z95828 Presence of other vascular implants and grafts: Secondary | ICD-10-CM

## 2019-06-29 DIAGNOSIS — D509 Iron deficiency anemia, unspecified: Secondary | ICD-10-CM

## 2019-06-29 DIAGNOSIS — C786 Secondary malignant neoplasm of retroperitoneum and peritoneum: Secondary | ICD-10-CM

## 2019-06-29 DIAGNOSIS — Z86711 Personal history of pulmonary embolism: Secondary | ICD-10-CM

## 2019-06-29 DIAGNOSIS — Z9221 Personal history of antineoplastic chemotherapy: Secondary | ICD-10-CM | POA: Diagnosis not present

## 2019-06-29 DIAGNOSIS — D500 Iron deficiency anemia secondary to blood loss (chronic): Secondary | ICD-10-CM

## 2019-06-29 LAB — CMP (CANCER CENTER ONLY)
ALT: <6 U/L (ref 0–44)
AST: 15 U/L (ref 15–41)
Albumin: 3 g/dL — ABNORMAL LOW (ref 3.5–5.0)
Alkaline Phosphatase: 108 U/L (ref 38–126)
Anion gap: 10 (ref 5–15)
BUN: 9 mg/dL (ref 6–20)
CO2: 24 mmol/L (ref 22–32)
Calcium: 8.8 mg/dL — ABNORMAL LOW (ref 8.9–10.3)
Chloride: 102 mmol/L (ref 98–111)
Creatinine: 0.77 mg/dL (ref 0.61–1.24)
GFR, Est AFR Am: >60 mL/min (ref >60)
GFR, Estimated: >60 mL/min (ref >60)
Glucose, Bld: 103 mg/dL — ABNORMAL HIGH (ref 70–99)
Potassium: 4.3 mmol/L (ref 3.5–5.1)
Sodium: 136 mmol/L (ref 135–145)
Total Bilirubin: 0.2 mg/dL — ABNORMAL LOW (ref 0.3–1.2)
Total Protein: 8.9 g/dL — ABNORMAL HIGH (ref 6.5–8.1)

## 2019-06-29 LAB — CBC WITH DIFFERENTIAL (CANCER CENTER ONLY)
Abs Immature Granulocytes: 0.02 10*3/uL (ref 0.00–0.07)
Basophils Absolute: 0.1 10*3/uL (ref 0.0–0.1)
Basophils Relative: 1 %
Eosinophils Absolute: 0.3 10*3/uL (ref 0.0–0.5)
Eosinophils Relative: 3 %
HCT: 33.2 % — ABNORMAL LOW (ref 39.0–52.0)
Hemoglobin: 10.3 g/dL — ABNORMAL LOW (ref 13.0–17.0)
Immature Granulocytes: 0 %
Lymphocytes Relative: 21 %
Lymphs Abs: 2 10*3/uL (ref 0.7–4.0)
MCH: 26.9 pg (ref 26.0–34.0)
MCHC: 31 g/dL (ref 30.0–36.0)
MCV: 86.7 fL (ref 80.0–100.0)
Monocytes Absolute: 0.8 10*3/uL (ref 0.1–1.0)
Monocytes Relative: 9 %
Neutro Abs: 6.4 10*3/uL (ref 1.7–7.7)
Neutrophils Relative %: 66 %
Platelet Count: 165 10*3/uL (ref 150–400)
RBC: 3.83 MIL/uL — ABNORMAL LOW (ref 4.22–5.81)
RDW: 15.9 % — ABNORMAL HIGH (ref 11.5–15.5)
WBC Count: 9.6 10*3/uL (ref 4.0–10.5)
nRBC: 0 % (ref 0.0–0.2)

## 2019-06-29 LAB — MAGNESIUM: Magnesium: 1.2 mg/dL — CL (ref 1.7–2.4)

## 2019-06-29 MED ORDER — SODIUM CHLORIDE 0.9% FLUSH
10.0000 mL | INTRAVENOUS | Status: DC | PRN
  Administered 2019-06-29: 10 mL via INTRAVENOUS
  Filled 2019-06-29: qty 10

## 2019-06-29 MED ORDER — SODIUM CHLORIDE 0.9 % IV SOLN
6.0000 mg/kg | Freq: Once | INTRAVENOUS | Status: AC
  Administered 2019-06-29: 10:00:00 300 mg via INTRAVENOUS
  Filled 2019-06-29: qty 15

## 2019-06-29 MED ORDER — HEPARIN SOD (PORK) LOCK FLUSH 100 UNIT/ML IV SOLN
500.0000 [IU] | Freq: Once | INTRAVENOUS | Status: AC | PRN
  Administered 2019-06-29: 500 [IU]
  Filled 2019-06-29: qty 5

## 2019-06-29 MED ORDER — SODIUM CHLORIDE 0.9 % IV SOLN
Freq: Once | INTRAVENOUS | Status: AC
  Administered 2019-06-29: 10:00:00 via INTRAVENOUS
  Filled 2019-06-29: qty 250

## 2019-06-29 MED ORDER — SODIUM CHLORIDE 0.9% FLUSH
10.0000 mL | INTRAVENOUS | Status: DC | PRN
  Administered 2019-06-29: 11:00:00 10 mL
  Filled 2019-06-29: qty 10

## 2019-06-29 NOTE — Patient Instructions (Addendum)
[facility] Discharge Instructions for Patients Receiving Chemotherapy  Today you received the following chemotherapy agents: Vectibix.  To help prevent nausea and vomiting after your treatment, we encourage you to take your nausea medication as directed.   If you develop nausea and vomiting that is not controlled by your nausea medication, call the clinic.   BELOW ARE SYMPTOMS THAT SHOULD BE REPORTED IMMEDIATELY:  *FEVER GREATER THAN 100.5 F  *CHILLS WITH OR WITHOUT FEVER  NAUSEA AND VOMITING THAT IS NOT CONTROLLED WITH YOUR NAUSEA MEDICATION  *UNUSUAL SHORTNESS OF BREATH  *UNUSUAL BRUISING OR BLEEDING  TENDERNESS IN MOUTH AND THROAT WITH OR WITHOUT PRESENCE OF ULCERS  *URINARY PROBLEMS  *BOWEL PROBLEMS  UNUSUAL RASH Items with * indicate a potential emergency and should be followed up as soon as possible.  Feel free to call the clinic should you have any questions or concerns. The clinic phone number is [phone].  Please show the CHEMO ALERT CARD at check-in to the Emergency Department and triage nurse.   

## 2019-06-29 NOTE — Telephone Encounter (Signed)
Per 7/2 los CT CAP with contrast in 3-4 weeks.

## 2019-06-29 NOTE — Progress Notes (Signed)
[name] says OK to treat without Mag and CMP results.  We will give him Mag if it is low today after his Vectibix per [name]. Gardiner Rhyme

## 2019-06-29 NOTE — Progress Notes (Signed)
CRITICAL VALUE STICKER  CRITICAL VALUE:  Magnesium 1.2  RECEIVER (on-site recipient of call):  Valda Favia RN  [place] NOTIFIED: 06/29/2019 0940  MESSENGER (representative from lab): Carver Fila  MD NOTIFIED: [name]  TIME OF NOTIFICATION: 0945  RESPONSE:

## 2019-07-05 MED FILL — POTASSIUM CHLORIDE CRYS ER: 20 | 30 days supply | Qty: 60 | Fill #1

## 2019-07-06 ENCOUNTER — Other Ambulatory Visit: Payer: Self-pay | Admitting: Hematology

## 2019-07-06 DIAGNOSIS — C189 Malignant neoplasm of colon, unspecified: Secondary | ICD-10-CM

## 2019-07-06 MED FILL — GABAPENTIN 300 MG CAPSULE: 300 | 30 days supply | Qty: 90 | Fill #0

## 2019-07-07 ENCOUNTER — Other Ambulatory Visit: Payer: Self-pay | Admitting: Hematology

## 2019-07-07 DIAGNOSIS — C189 Malignant neoplasm of colon, unspecified: Secondary | ICD-10-CM

## 2019-07-07 MED ORDER — MORPHINE SULFATE ER 60 MG PO TBCR: 60.0000 mg | EXTENDED_RELEASE_TABLET | Freq: Two times a day (BID) | ORAL | 0 refills | Status: DC

## 2019-07-07 MED FILL — MORPHINE SULF 60 MG TAB SA: 60 | 30 days supply | Qty: 60 | Fill #0

## 2019-07-07 MED FILL — MIRTAZAPINE 15 MG TABLET: 15 | 30 days supply | Qty: 30 | Fill #0

## 2019-07-10 NOTE — Progress Notes (Signed)
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 He has been coughing lately. -He takesMS Contin 3027mID and Oxycodone 56m71mp to 5times a day)as needed. Pain remains controlled. Healsotakes Gabapentin for his leg pain. -His left abdominal pain has recently worsened, but still controlled by pain meds.   6. 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 -Currently on MirtazapineandProtonix. -He has been able to gain 10 poundsrecently -His eating improved and has was able to gain weight.  -He has been taking 3-4 ensure daily, I encouraged him to continue   7. Hypomagnesemia -Secondary to Vectibix, it is getting worse -We will give IV magnesium 4 g today, and early next week -He will continue oral magnesium twice daily   7. Goal of care discussion, DNR/DNI -Wepreviouslydiscussed the incurable nature of his cancer, and the overall poor prognosis,  especiallyhis overall condition hasdeterioratedlately -he agreed with DNR/DNI -he was on hospice care for 2-3 weeks in March 2020,he  has withdrawn from hospice due to his medication issue.  Plan -Labsreviewedand adequateto proceed with vectibix today -4g IV magnesium today and early next week  -Lab,flush, f/u and Vectibix in2 weeks -CT CAP on 7/27 -I called her daughter during his visit today    No problem-specific Assessment & Plan notes found for this encounter.   No orders of the defined types were placed in this encounter.  All questions were answered. The patient knows to call the clinic with any problems, questions or concerns. No barriers to learning was detected. I spent 20 minutes counseling the patient face to face. The total time spent in the appointment was 25 minutes and more than 50% was on counseling and review of test results     Jeff Merle, MD 07/13/2019   I, Joslyn Devon, am acting as scribe for Jeff Merle, MD.   I have reviewed the above documentation for accuracy and completeness, and I agree with the above.

## 2019-07-13 ENCOUNTER — Inpatient Hospital Stay (HOSPITAL_BASED_OUTPATIENT_CLINIC_OR_DEPARTMENT_OTHER): Payer: Medicaid Other | Admitting: Hematology

## 2019-07-13 ENCOUNTER — Inpatient Hospital Stay: Payer: Medicaid Other

## 2019-07-13 ENCOUNTER — Other Ambulatory Visit: Payer: Self-pay

## 2019-07-13 ENCOUNTER — Encounter: Payer: Self-pay | Admitting: Hematology

## 2019-07-13 VITALS — BP 100/74 | HR 90 | Temp 98.0°F | Resp 18 | Ht 69.0 in | Wt 106.0 lb

## 2019-07-13 DIAGNOSIS — C189 Malignant neoplasm of colon, unspecified: Secondary | ICD-10-CM

## 2019-07-13 DIAGNOSIS — Z9221 Personal history of antineoplastic chemotherapy: Secondary | ICD-10-CM

## 2019-07-13 DIAGNOSIS — Z7901 Long term (current) use of anticoagulants: Secondary | ICD-10-CM

## 2019-07-13 DIAGNOSIS — D630 Anemia in neoplastic disease: Secondary | ICD-10-CM

## 2019-07-13 DIAGNOSIS — C786 Secondary malignant neoplasm of retroperitoneum and peritoneum: Secondary | ICD-10-CM

## 2019-07-13 DIAGNOSIS — C762 Malignant neoplasm of abdomen: Secondary | ICD-10-CM

## 2019-07-13 DIAGNOSIS — Z79899 Other long term (current) drug therapy: Secondary | ICD-10-CM

## 2019-07-13 DIAGNOSIS — C186 Malignant neoplasm of descending colon: Secondary | ICD-10-CM | POA: Diagnosis not present

## 2019-07-13 DIAGNOSIS — Z5112 Encounter for antineoplastic immunotherapy: Secondary | ICD-10-CM | POA: Diagnosis not present

## 2019-07-13 DIAGNOSIS — Z86718 Personal history of other venous thrombosis and embolism: Secondary | ICD-10-CM

## 2019-07-13 DIAGNOSIS — Z66 Do not resuscitate: Secondary | ICD-10-CM

## 2019-07-13 DIAGNOSIS — Z95828 Presence of other vascular implants and grafts: Secondary | ICD-10-CM

## 2019-07-13 DIAGNOSIS — D638 Anemia in other chronic diseases classified elsewhere: Secondary | ICD-10-CM

## 2019-07-13 DIAGNOSIS — D509 Iron deficiency anemia, unspecified: Secondary | ICD-10-CM

## 2019-07-13 DIAGNOSIS — Z86711 Personal history of pulmonary embolism: Secondary | ICD-10-CM

## 2019-07-13 DIAGNOSIS — D500 Iron deficiency anemia secondary to blood loss (chronic): Secondary | ICD-10-CM

## 2019-07-13 LAB — CBC WITH DIFFERENTIAL (CANCER CENTER ONLY)
Abs Immature Granulocytes: 0.05 10*3/uL (ref 0.00–0.07)
Basophils Absolute: 0.1 10*3/uL (ref 0.0–0.1)
Basophils Relative: 1 %
Eosinophils Absolute: 0.1 10*3/uL (ref 0.0–0.5)
Eosinophils Relative: 0 %
HCT: 33.3 % — ABNORMAL LOW (ref 39.0–52.0)
Hemoglobin: 10.5 g/dL — ABNORMAL LOW (ref 13.0–17.0)
Immature Granulocytes: 0 %
Lymphocytes Relative: 14 %
Lymphs Abs: 1.5 10*3/uL (ref 0.7–4.0)
MCH: 26.9 pg (ref 26.0–34.0)
MCHC: 31.5 g/dL (ref 30.0–36.0)
MCV: 85.4 fL (ref 80.0–100.0)
Monocytes Absolute: 0.8 10*3/uL (ref 0.1–1.0)
Monocytes Relative: 7 %
Neutro Abs: 8.8 10*3/uL — ABNORMAL HIGH (ref 1.7–7.7)
Neutrophils Relative %: 78 %
Platelet Count: 143 10*3/uL — ABNORMAL LOW (ref 150–400)
RBC: 3.9 MIL/uL — ABNORMAL LOW (ref 4.22–5.81)
RDW: 16.3 % — ABNORMAL HIGH (ref 11.5–15.5)
WBC Count: 11.3 10*3/uL — ABNORMAL HIGH (ref 4.0–10.5)
nRBC: 0 % (ref 0.0–0.2)

## 2019-07-13 LAB — CMP (CANCER CENTER ONLY)
ALT: <6 U/L (ref 0–44)
AST: 15 U/L (ref 15–41)
Albumin: 2.3 g/dL — ABNORMAL LOW (ref 3.5–5.0)
Alkaline Phosphatase: 113 U/L (ref 38–126)
Anion gap: 8 (ref 5–15)
BUN: 6 mg/dL (ref 6–20)
CO2: 25 mmol/L (ref 22–32)
Calcium: 7.9 mg/dL — ABNORMAL LOW (ref 8.9–10.3)
Chloride: 102 mmol/L (ref 98–111)
Creatinine: 0.69 mg/dL (ref 0.61–1.24)
GFR, Est AFR Am: >60 mL/min (ref >60)
GFR, Estimated: >60 mL/min (ref >60)
Glucose, Bld: 123 mg/dL — ABNORMAL HIGH (ref 70–99)
Potassium: 4 mmol/L (ref 3.5–5.1)
Sodium: 135 mmol/L (ref 135–145)
Total Bilirubin: 0.3 mg/dL (ref 0.3–1.2)
Total Protein: 7.7 g/dL (ref 6.5–8.1)

## 2019-07-13 LAB — FERRITIN: Ferritin: 501 ng/mL — ABNORMAL HIGH (ref 24–336)

## 2019-07-13 LAB — CEA (IN HOUSE-CHCC): CEA (CHCC-In House): 52.03 ng/mL — ABNORMAL HIGH (ref 0.00–5.00)

## 2019-07-13 LAB — MAGNESIUM: Magnesium: 1 mg/dL — CL (ref 1.7–2.4)

## 2019-07-13 MED ORDER — SODIUM CHLORIDE 0.9% FLUSH
10.0000 mL | INTRAVENOUS | Status: DC | PRN
  Administered 2019-07-13: 10 mL via INTRAVENOUS
  Filled 2019-07-13: qty 10

## 2019-07-13 MED ORDER — SODIUM CHLORIDE 0.9% FLUSH
10.0000 mL | INTRAVENOUS | Status: DC | PRN
  Administered 2019-07-13: 10 mL
  Filled 2019-07-13: qty 10

## 2019-07-13 MED ORDER — SODIUM CHLORIDE 0.9 % IV SOLN: 4.0000 g | Freq: Once | INTRAVENOUS | Status: DC

## 2019-07-13 MED ORDER — MAGNESIUM SULFATE 4 GM/100ML IV SOLN
4.0000 g | Freq: Once | INTRAVENOUS | Status: AC
  Administered 2019-07-13: 4 g via INTRAVENOUS
  Filled 2019-07-13: qty 100

## 2019-07-13 MED ORDER — SODIUM CHLORIDE 0.9 % IV SOLN
Freq: Once | INTRAVENOUS | Status: AC
  Administered 2019-07-13: 11:00:00 via INTRAVENOUS
  Filled 2019-07-13: qty 250

## 2019-07-13 MED ORDER — HEPARIN SOD (PORK) LOCK FLUSH 100 UNIT/ML IV SOLN
500.0000 [IU] | Freq: Once | INTRAVENOUS | Status: AC | PRN
  Administered 2019-07-13: 500 [IU]
  Filled 2019-07-13: qty 5

## 2019-07-13 MED ORDER — SODIUM CHLORIDE 0.9 % IV SOLN
6.0000 mg/kg | Freq: Once | INTRAVENOUS | Status: AC
  Administered 2019-07-13: 300 mg via INTRAVENOUS
  Filled 2019-07-13: qty 15

## 2019-07-13 NOTE — Patient Instructions (Signed)
[facility] Discharge Instructions for Patients Receiving Chemotherapy  Today you received the following chemotherapy agents Vectibix  To help prevent nausea and vomiting after your treatment, we encourage you to take your nausea medication as directed   If you develop nausea and vomiting that is not controlled by your nausea medication, call the clinic.   BELOW ARE SYMPTOMS THAT SHOULD BE REPORTED IMMEDIATELY:  *FEVER GREATER THAN 100.5 F  *CHILLS WITH OR WITHOUT FEVER  NAUSEA AND VOMITING THAT IS NOT CONTROLLED WITH YOUR NAUSEA MEDICATION  *UNUSUAL SHORTNESS OF BREATH  *UNUSUAL BRUISING OR BLEEDING  TENDERNESS IN MOUTH AND THROAT WITH OR WITHOUT PRESENCE OF ULCERS  *URINARY PROBLEMS  *BOWEL PROBLEMS  UNUSUAL RASH Items with * indicate a potential emergency and should be followed up as soon as possible.  Feel free to call the clinic should you have any questions or concerns. The clinic phone number is [phone].  Please show the [facility] at check-in to the Emergency Department and triage nurse.     Hypomagnesemia (Low magnesium) Hypomagnesemia is a condition in which the level of magnesium in the blood is low. Magnesium is a mineral that is found in many foods. It is used in many different processes in the body. Hypomagnesemia can affect every organ in the body. In severe cases, it can cause life-threatening problems. What are the causes? This condition may be caused by:  Not getting enough magnesium in your diet.  Malnutrition.  Problems with absorbing magnesium from the intestines.  Dehydration.  Alcohol abuse.  Vomiting.  Severe or chronic diarrhea.  Some medicines, including medicines that make you urinate more (diuretics).  Certain diseases, such as kidney disease, diabetes, celiac disease, and overactive thyroid. What are the signs or symptoms? Symptoms of this condition include:  Loss of appetite.  Nausea and vomiting.   Involuntary shaking or trembling of a body part (tremor).  Muscle weakness.  Tingling in the arms and legs.  Sudden tightening of muscles (muscle spasms).  Confusion.  Psychiatric issues, such as depression, irritability, or psychosis.  A feeling of fluttering of the heart.  Seizures. These symptoms are more severe if magnesium levels drop suddenly. How is this diagnosed? This condition may be diagnosed based on:  Your symptoms and medical history.  A physical exam.  Blood and urine tests. How is this treated? Treatment depends on the cause and the severity of the condition. It may be treated with:  A magnesium supplement. This can be taken in pill form. If the condition is severe, magnesium is usually given through an IV.  Changes to your diet. You may be directed to eat foods that have a lot of magnesium, such as green leafy vegetables, peas, beans, and nuts.  Stopping any intake of alcohol. Follow these instructions at home:      Make sure that your diet includes foods with magnesium. Foods that have a lot of magnesium in them include: ? Green leafy vegetables, such as spinach and broccoli. ? Beans and peas. ? Nuts and seeds, such as almonds and sunflower seeds. ? Whole grains, such as whole grain bread and fortified cereals.  Take magnesium supplements if your health care provider tells you to do that. Take them as directed.  Take over-the-counter and prescription medicines only as told by your health care provider.  Have your magnesium levels monitored as told by your health care provider.  When you are active, drink fluids that contain electrolytes.  Avoid drinking alcohol.  Keep all follow-up visits as told by your health care provider. This is important. Contact a health care provider if:  You get worse instead of better.  Your symptoms return. Get help right away if you:  Develop severe muscle weakness.  Have trouble breathing.  Feel that  your heart is racing. Summary  Hypomagnesemia is a condition in which the level of magnesium in the blood is low.  Hypomagnesemia can affect every organ in the body.  Treatment may include eating more foods that contain magnesium, taking magnesium supplements, and not drinking alcohol.  Have your magnesium levels monitored as told by your health care provider. This information is not intended to replace advice given to you by your health care provider. Make sure you discuss any questions you have with your health care provider. Document Released: 09/09/2005 Document Revised: 11/26/2017 Document Reviewed: 11/15/2017 Elsevier Patient Education  2020 [employer].

## 2019-07-13 NOTE — Progress Notes (Signed)
Per [name] ok to treat today with Mag 1.0. Pt to receive IV mag supplement today

## 2019-07-13 NOTE — Patient Instructions (Signed)

## 2019-07-13 NOTE — Progress Notes (Signed)
CRITICAL VALUE STICKER  CRITICAL VALUE:  Magnesium 1.0  RECEIVER (on-site recipient of call):  Valda Favia RN DATE & TIME NOTIFIED: 07/13/2019  1055  MESSENGER (representative from lab): Verdis Frederickson  MD NOTIFIED: [name]   TIME OF NOTIFICATION:  11:00  RESPONSE:  New orders were received for 4 gm Mag

## 2019-07-14 ENCOUNTER — Telehealth: Payer: Self-pay | Admitting: Hematology

## 2019-07-14 NOTE — Telephone Encounter (Signed)
Scheduled appt per 7/16 sch message - pt aware of appt date and time   

## 2019-07-14 NOTE — Telephone Encounter (Signed)
No los per 7/16. °

## 2019-07-17 ENCOUNTER — Inpatient Hospital Stay: Payer: Medicaid Other

## 2019-07-17 ENCOUNTER — Other Ambulatory Visit: Payer: Self-pay

## 2019-07-17 DIAGNOSIS — D509 Iron deficiency anemia, unspecified: Secondary | ICD-10-CM

## 2019-07-17 DIAGNOSIS — Z95828 Presence of other vascular implants and grafts: Secondary | ICD-10-CM

## 2019-07-17 DIAGNOSIS — C189 Malignant neoplasm of colon, unspecified: Secondary | ICD-10-CM

## 2019-07-17 DIAGNOSIS — Z5112 Encounter for antineoplastic immunotherapy: Secondary | ICD-10-CM | POA: Diagnosis not present

## 2019-07-17 DIAGNOSIS — D500 Iron deficiency anemia secondary to blood loss (chronic): Secondary | ICD-10-CM

## 2019-07-17 DIAGNOSIS — D630 Anemia in neoplastic disease: Secondary | ICD-10-CM

## 2019-07-17 LAB — CBC WITH DIFFERENTIAL (CANCER CENTER ONLY)
Abs Immature Granulocytes: 0.03 10*3/uL (ref 0.00–0.07)
Basophils Absolute: 0.1 10*3/uL (ref 0.0–0.1)
Basophils Relative: 1 %
Eosinophils Absolute: 0.1 10*3/uL (ref 0.0–0.5)
Eosinophils Relative: 1 %
HCT: 29.5 % — ABNORMAL LOW (ref 39.0–52.0)
Hemoglobin: 9.3 g/dL — ABNORMAL LOW (ref 13.0–17.0)
Immature Granulocytes: 0 %
Lymphocytes Relative: 18 %
Lymphs Abs: 1.8 10*3/uL (ref 0.7–4.0)
MCH: 26.9 pg (ref 26.0–34.0)
MCHC: 31.5 g/dL (ref 30.0–36.0)
MCV: 85.3 fL (ref 80.0–100.0)
Monocytes Absolute: 0.7 10*3/uL (ref 0.1–1.0)
Monocytes Relative: 7 %
Neutro Abs: 7.6 10*3/uL (ref 1.7–7.7)
Neutrophils Relative %: 73 %
Platelet Count: 176 10*3/uL (ref 150–400)
RBC: 3.46 MIL/uL — ABNORMAL LOW (ref 4.22–5.81)
RDW: 16 % — ABNORMAL HIGH (ref 11.5–15.5)
WBC Count: 10.4 10*3/uL (ref 4.0–10.5)
nRBC: 0 % (ref 0.0–0.2)

## 2019-07-17 LAB — CMP (CANCER CENTER ONLY)
ALT: <6 U/L (ref 0–44)
AST: 14 U/L — ABNORMAL LOW (ref 15–41)
Albumin: 2.4 g/dL — ABNORMAL LOW (ref 3.5–5.0)
Alkaline Phosphatase: 105 U/L (ref 38–126)
Anion gap: 8 (ref 5–15)
BUN: 6 mg/dL (ref 6–20)
CO2: 23 mmol/L (ref 22–32)
Calcium: 8.1 mg/dL — ABNORMAL LOW (ref 8.9–10.3)
Chloride: 103 mmol/L (ref 98–111)
Creatinine: 0.76 mg/dL (ref 0.61–1.24)
GFR, Est AFR Am: >60 mL/min (ref >60)
GFR, Estimated: >60 mL/min (ref >60)
Glucose, Bld: 113 mg/dL — ABNORMAL HIGH (ref 70–99)
Potassium: 4.3 mmol/L (ref 3.5–5.1)
Sodium: 134 mmol/L — ABNORMAL LOW (ref 135–145)
Total Bilirubin: <0.2 mg/dL — ABNORMAL LOW (ref 0.3–1.2)
Total Protein: 7.7 g/dL (ref 6.5–8.1)

## 2019-07-17 MED ORDER — SODIUM CHLORIDE 0.9% FLUSH
3.0000 mL | Freq: Once | INTRAVENOUS | Status: AC | PRN
  Administered 2019-07-17: 3 mL
  Filled 2019-07-17: qty 10

## 2019-07-17 MED ORDER — MAGNESIUM SULFATE 4 GM/100ML IV SOLN
4.0000 g | Freq: Once | INTRAVENOUS | Status: AC
  Administered 2019-07-17: 4 g via INTRAVENOUS
  Filled 2019-07-17: qty 100

## 2019-07-17 MED ORDER — MAGNESIUM SULFATE 2 GM/50ML IV SOLN: 2.0000 g | Freq: Once | INTRAVENOUS | Status: DC

## 2019-07-17 MED ORDER — HEPARIN SOD (PORK) LOCK FLUSH 100 UNIT/ML IV SOLN
500.0000 [IU] | Freq: Once | INTRAVENOUS | Status: AC
  Administered 2019-07-17: 17:00:00 500 [IU]
  Filled 2019-07-17: qty 5

## 2019-07-17 MED ORDER — SODIUM CHLORIDE 0.9 % IV SOLN
Freq: Once | INTRAVENOUS | Status: AC
  Administered 2019-07-17: 14:00:00 via INTRAVENOUS
  Filled 2019-07-17: qty 250

## 2019-07-17 NOTE — Patient Instructions (Signed)
Hypomagnesemia Hypomagnesemia is a condition in which the level of magnesium in the blood is low. Magnesium is a mineral that is found in many foods. It is used in many different processes in the body. Hypomagnesemia can affect every organ in the body. In severe cases, it can cause life-threatening problems. What are the causes? This condition may be caused by:  Not getting enough magnesium in your diet.  Malnutrition.  Problems with absorbing magnesium from the intestines.  Dehydration.  Alcohol abuse.  Vomiting.  Severe or chronic diarrhea.  Some medicines, including medicines that make you urinate more (diuretics).  Certain diseases, such as kidney disease, diabetes, celiac disease, and overactive thyroid. What are the signs or symptoms? Symptoms of this condition include:  Loss of appetite.  Nausea and vomiting.  Involuntary shaking or trembling of a body part (tremor).  Muscle weakness.  Tingling in the arms and legs.  Sudden tightening of muscles (muscle spasms).  Confusion.  Psychiatric issues, such as depression, irritability, or psychosis.  A feeling of fluttering of the heart.  Seizures. These symptoms are more severe if magnesium levels drop suddenly. How is this diagnosed? This condition may be diagnosed based on:  Your symptoms and medical history.  A physical exam.  Blood and urine tests. How is this treated? Treatment depends on the cause and the severity of the condition. It may be treated with:  A magnesium supplement. This can be taken in pill form. If the condition is severe, magnesium is usually given through an IV.  Changes to your diet. You may be directed to eat foods that have a lot of magnesium, such as green leafy vegetables, peas, beans, and nuts.  Stopping any intake of alcohol. Follow these instructions at home:      Make sure that your diet includes foods with magnesium. Foods that have a lot of magnesium in them include:  ? Green leafy vegetables, such as spinach and broccoli. ? Beans and peas. ? Nuts and seeds, such as almonds and sunflower seeds. ? Whole grains, such as whole grain bread and fortified cereals.  Take magnesium supplements if your health care provider tells you to do that. Take them as directed.  Take over-the-counter and prescription medicines only as told by your health care provider.  Have your magnesium levels monitored as told by your health care provider.  When you are active, drink fluids that contain electrolytes.  Avoid drinking alcohol.  Keep all follow-up visits as told by your health care provider. This is important. Contact a health care provider if:  You get worse instead of better.  Your symptoms return. Get help right away if you:  Develop severe muscle weakness.  Have trouble breathing.  Feel that your heart is racing. Summary  Hypomagnesemia is a condition in which the level of magnesium in the blood is low.  Hypomagnesemia can affect every organ in the body.  Treatment may include eating more foods that contain magnesium, taking magnesium supplements, and not drinking alcohol.  Have your magnesium levels monitored as told by your health care provider. This information is not intended to replace advice given to you by your health care provider. Make sure you discuss any questions you have with your health care provider. Document Released: 09/09/2005 Document Revised: 11/26/2017 Document Reviewed: 11/15/2017 Elsevier Patient Education  2020 [employer]  Coronavirus (COVID-19) Are you at risk?  Are you at risk for the Coronavirus (COVID-19)?  To be considered HIGH RISK for Coronavirus (COVID-19), you have   to meet the following criteria:  . Traveled to [place], [place], [place], [place] or [place]; or in the [place] to [place], [place], [place], or [place]; and have fever, cough, and shortness of breath within the last 2 weeks of travel OR  . Been in close contact with a person diagnosed with COVID-19 within the last 2 weeks and have fever, cough, and shortness of breath . IF YOU DO NOT MEET THESE CRITERIA, YOU ARE CONSIDERED LOW RISK FOR COVID-19.  What to do if you are HIGH RISK for COVID-19?  . If you are having a medical emergency, call 911. . Seek medical care right away. Before you go to a doctor's office, urgent care or emergency department, call ahead and tell them about your recent travel, contact with someone diagnosed with COVID-19, and your symptoms. You should receive instructions from your physician's office regarding next steps of care.  . When you arrive at healthcare provider, tell the healthcare staff immediately you have returned from visiting [place], [place], [place], [place] or [place]; or traveled in the [place] to [place], [place], [place], or [place]; in the last two weeks or you have been in close contact with a person diagnosed with COVID-19 in the last 2 weeks.   . Tell the health care staff about your symptoms: fever, cough and shortness of breath. . After you have been seen by a medical provider, you will be either: o Tested for (COVID-19) and discharged home on quarantine except to seek medical care if symptoms worsen, and asked to  - Stay home and avoid contact with others until you get your results (4-5 days)  - Avoid travel on public transportation if possible (such as bus, train, or airplane) or o Sent to the Emergency Department by EMS for evaluation, COVID-19 testing, and possible admission depending on your condition and test results.  What to do if you are LOW RISK for COVID-19?  Reduce your risk of any infection by using the same precautions used for avoiding the common cold or flu:  . Wash your hands often with soap and warm water for at least 20 seconds.  If soap and water are not readily available, use an alcohol-based hand sanitizer with at least 60% alcohol.  . If coughing or  sneezing, cover your mouth and nose by coughing or sneezing into the elbow areas of your shirt or coat, into a tissue or into your sleeve (not your hands). . Avoid shaking hands with others and consider head nods or verbal greetings only. . Avoid touching your eyes, nose, or mouth with unwashed hands.  . Avoid close contact with people who are sick. . Avoid places or events with large numbers of people in one location, like concerts or sporting events. . Carefully consider travel plans you have or are making. . If you are planning any travel outside or inside the [place], visit the CDC's Travelers' Health webpage for the latest health notices. . If you have some symptoms but not all symptoms, continue to monitor at home and seek medical attention if your symptoms worsen. . If you are having a medical emergency, call 911.   ADDITIONAL HEALTHCARE OPTIONS FOR PATIENTS  [facility] / e-Visit: [email or web address]         [facility]: [phone]  [facility]: [phone]                   [facility]:   [phone]   

## 2019-07-24 ENCOUNTER — Encounter (HOSPITAL_COMMUNITY): Payer: Self-pay

## 2019-07-24 ENCOUNTER — Other Ambulatory Visit: Payer: Self-pay

## 2019-07-24 ENCOUNTER — Ambulatory Visit (HOSPITAL_COMMUNITY)
Admission: RE | Admit: 2019-07-24 | Discharge: 2019-07-24 | Disposition: A | Discharge status: Home / Self Care | Payer: Medicaid Other | Source: Ambulatory Visit | Attending: Hematology | Admitting: Hematology

## 2019-07-24 DIAGNOSIS — C189 Malignant neoplasm of colon, unspecified: Secondary | ICD-10-CM | POA: Diagnosis not present

## 2019-07-24 MED ORDER — SODIUM CHLORIDE (PF) 0.9 % IJ SOLN
INTRAMUSCULAR | Status: AC
  Filled 2019-07-24: qty 50

## 2019-07-24 MED ORDER — IOHEXOL 300 MG/ML  SOLN
75.0000 mL | Freq: Once | INTRAMUSCULAR | Status: AC | PRN
  Administered 2019-07-24: 08:00:00 75 mL via INTRAVENOUS

## 2019-07-24 NOTE — Progress Notes (Signed)
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 discussed at this point as there is very limited more treatment options left, the only drug he has not tried is Regorafenib.  -I discussed there is a phase 1 clinical trial data (REGONIVO, JCO 37(15), 2019) which shows good response with Ragorafinib and Nivolumab in MSS metastatic colorectal cancer (objective response rate 29%). This is currently being investigated in phase 2 and 3 studies. Due to the current COVID 81 outbreak, he is not able to participate the trials, and I will offer this regimen off label.         ---Chemotherapy consent: Side effects including but does not limited to, fatigue, nausea, vomiting, diarrhea, hand-foot syndrome, hypertension, risk of bleeding and thrombosis, proteinuria and renal small risk of bowel perforation, function, and other endocrine disorders pneumonitis,etc were discussed with patient in great detail. He agrees to proceed. -the goal of therapy is palliative -I discussed the option of palliative care alone.  He previously  enrolled to hospice, but withdrew due to limited medication coverage. He does not want to restart hospice at this point  -He is willing to try Regorafenib and nivo. Plan  to start in 2 weeks.  -Labs reviewed, CBC and CMP WNL except HG 9.9, cal 8.3 Mag 1.0. His performance status is currently stable and adequate.    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  -OnXarelto24m once daily  -CT AP from 02/15/19 shows stable blood clots in abdomen. -Hepreviouslyhadbleeding at ostomy site on 04/07/19. 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 Currently on Ferrous Sulfate. Continue. -Labs reviewed,hemoglobin9.9today (07/27/19). Ferritin still pending.   5. Abdominal pain -He has chronic left upper quadrant abdominal pain, secondary to tumor. 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 He has been coughing lately. -He takesMS Contin 3050mID and Oxycodone 80m42mp to 5times a day)as needed.Pain remains controlled.Healsotakes Gabapentin for his leg pain and Levsin for abdominal muscle spasms.  -His left abdominal pain has recently worsened, but still controlled by pain meds. I refilled MS Contin for 8/10 and Oxycodone today (07/27/19)  6. 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 -Currently on MirtazapineandProtonix. -He has been able to gain 10 poundsrecently -His eating improved and has was able to gain weight, but lately his weight has bene fluctuating.  -He has been taking 3-4 ensure daily, I encouraged him  to continue  7. Hypomagnesemia -Secondary to Vectibix, it is getting worse -He will continue oral magnesium twice daily -Mag at 1.0 today (07/27/19).  -Vectibix has been d/c  7. Goal of care discussion, DNR/DNI -Wepreviouslydiscussed the incurable nature of his cancer, and the overall poor prognosis, especiallyhis overall condition hasdeterioratedlately -he agreed with DNR/DNI -he was on hospice care for 2-3 weeks in March 2020,he has withdrawn from hospice due to his medication issue.  Plan -I refilled Levsin, Oxycodone, MS Contin, Xarelto, lomotil today  -CT CAP reviewed, he has disease progression. Will d/c Vectibix  -IV magnesium 4g today  -Lab, flush, f/u and nivo in 2 weeks, he will start Regorafenib at same time  -I called her daughter during his visit today. Pharmacy will call her about Regorafenib    No problem-specific Assessment & Plan notes found for this encounter.   No orders of the defined types were placed in this encounter.  All questions were answered. The patient knows to call the clinic with any problems, questions or concerns. No barriers to learning was detected. I spent 30 minutes counseling the patient face to face. The total time spent in the appointment was 45 minutes and more than 50% was on counseling and review of test results     Roney Youtz [name] 07/27/2019   I, AmoyJoslyn Devon acting as scribe for Kimberlyann Hollar [name].   I have reviewed the above documentation for accuracy and completeness, and I agree with the above.

## 2019-07-27 ENCOUNTER — Other Ambulatory Visit: Payer: Self-pay

## 2019-07-27 ENCOUNTER — Inpatient Hospital Stay (HOSPITAL_BASED_OUTPATIENT_CLINIC_OR_DEPARTMENT_OTHER): Payer: Medicaid Other | Admitting: Hematology

## 2019-07-27 ENCOUNTER — Telehealth: Payer: Self-pay | Admitting: Pharmacist

## 2019-07-27 ENCOUNTER — Inpatient Hospital Stay: Payer: Medicaid Other

## 2019-07-27 ENCOUNTER — Telehealth: Payer: Self-pay | Admitting: Hematology

## 2019-07-27 VITALS — BP 96/73 | HR 84 | Temp 98.3°F | Resp 18 | Ht 69.0 in | Wt 102.8 lb

## 2019-07-27 VITALS — BP 99/72 | HR 82 | Temp 98.7°F | Resp 16

## 2019-07-27 DIAGNOSIS — C189 Malignant neoplasm of colon, unspecified: Secondary | ICD-10-CM

## 2019-07-27 DIAGNOSIS — C786 Secondary malignant neoplasm of retroperitoneum and peritoneum: Secondary | ICD-10-CM | POA: Diagnosis not present

## 2019-07-27 DIAGNOSIS — Z95828 Presence of other vascular implants and grafts: Secondary | ICD-10-CM

## 2019-07-27 DIAGNOSIS — Z5112 Encounter for antineoplastic immunotherapy: Secondary | ICD-10-CM | POA: Diagnosis not present

## 2019-07-27 DIAGNOSIS — D509 Iron deficiency anemia, unspecified: Secondary | ICD-10-CM

## 2019-07-27 DIAGNOSIS — D638 Anemia in other chronic diseases classified elsewhere: Secondary | ICD-10-CM

## 2019-07-27 DIAGNOSIS — Z7901 Long term (current) use of anticoagulants: Secondary | ICD-10-CM

## 2019-07-27 DIAGNOSIS — C186 Malignant neoplasm of descending colon: Secondary | ICD-10-CM | POA: Diagnosis not present

## 2019-07-27 DIAGNOSIS — D630 Anemia in neoplastic disease: Secondary | ICD-10-CM

## 2019-07-27 DIAGNOSIS — D500 Iron deficiency anemia secondary to blood loss (chronic): Secondary | ICD-10-CM

## 2019-07-27 DIAGNOSIS — Z79899 Other long term (current) drug therapy: Secondary | ICD-10-CM

## 2019-07-27 DIAGNOSIS — E876 Hypokalemia: Secondary | ICD-10-CM

## 2019-07-27 DIAGNOSIS — Z66 Do not resuscitate: Secondary | ICD-10-CM

## 2019-07-27 DIAGNOSIS — Z86718 Personal history of other venous thrombosis and embolism: Secondary | ICD-10-CM

## 2019-07-27 DIAGNOSIS — Z9221 Personal history of antineoplastic chemotherapy: Secondary | ICD-10-CM

## 2019-07-27 DIAGNOSIS — Z86711 Personal history of pulmonary embolism: Secondary | ICD-10-CM

## 2019-07-27 DIAGNOSIS — K589 Irritable bowel syndrome without diarrhea: Secondary | ICD-10-CM

## 2019-07-27 LAB — CBC WITH DIFFERENTIAL (CANCER CENTER ONLY)
Abs Immature Granulocytes: 0.04 10*3/uL (ref 0.00–0.07)
Basophils Absolute: 0.1 10*3/uL (ref 0.0–0.1)
Basophils Relative: 1 %
Eosinophils Absolute: 0.2 10*3/uL (ref 0.0–0.5)
Eosinophils Relative: 2 %
HCT: 31.7 % — ABNORMAL LOW (ref 39.0–52.0)
Hemoglobin: 9.9 g/dL — ABNORMAL LOW (ref 13.0–17.0)
Immature Granulocytes: 0 %
Lymphocytes Relative: 23 %
Lymphs Abs: 2.2 10*3/uL (ref 0.7–4.0)
MCH: 26.6 pg (ref 26.0–34.0)
MCHC: 31.2 g/dL (ref 30.0–36.0)
MCV: 85.2 fL (ref 80.0–100.0)
Monocytes Absolute: 0.8 10*3/uL (ref 0.1–1.0)
Monocytes Relative: 8 %
Neutro Abs: 6.2 10*3/uL (ref 1.7–7.7)
Neutrophils Relative %: 66 %
Platelet Count: 194 10*3/uL (ref 150–400)
RBC: 3.72 MIL/uL — ABNORMAL LOW (ref 4.22–5.81)
RDW: 16.8 % — ABNORMAL HIGH (ref 11.5–15.5)
WBC Count: 9.6 10*3/uL (ref 4.0–10.5)
nRBC: 0 % (ref 0.0–0.2)

## 2019-07-27 LAB — CMP (CANCER CENTER ONLY)
ALT: <6 U/L (ref 0–44)
AST: 15 U/L (ref 15–41)
Albumin: 2.2 g/dL — ABNORMAL LOW (ref 3.5–5.0)
Alkaline Phosphatase: 129 U/L — ABNORMAL HIGH (ref 38–126)
Anion gap: 7 (ref 5–15)
BUN: 5 mg/dL — ABNORMAL LOW (ref 6–20)
CO2: 26 mmol/L (ref 22–32)
Calcium: 8.3 mg/dL — ABNORMAL LOW (ref 8.9–10.3)
Chloride: 103 mmol/L (ref 98–111)
Creatinine: 0.67 mg/dL (ref 0.61–1.24)
GFR, Est AFR Am: >60 mL/min (ref >60)
GFR, Estimated: >60 mL/min (ref >60)
Glucose, Bld: 99 mg/dL (ref 70–99)
Potassium: 3.9 mmol/L (ref 3.5–5.1)
Sodium: 136 mmol/L (ref 135–145)
Total Bilirubin: 0.2 mg/dL — ABNORMAL LOW (ref 0.3–1.2)
Total Protein: 7.8 g/dL (ref 6.5–8.1)

## 2019-07-27 LAB — MAGNESIUM: Magnesium: 1 mg/dL — CL (ref 1.7–2.4)

## 2019-07-27 MED ORDER — OXYCODONE HCL 10 MG PO TABS: 10.0000 mg | ORAL_TABLET | ORAL | 0 refills | Status: DC | PRN

## 2019-07-27 MED ORDER — SODIUM CHLORIDE 0.9% FLUSH
10.0000 mL | INTRAVENOUS | Status: DC | PRN
  Administered 2019-07-27: 08:00:00 10 mL via INTRAVENOUS
  Filled 2019-07-27: qty 10

## 2019-07-27 MED ORDER — SODIUM CHLORIDE 0.9 % IV SOLN
Freq: Once | INTRAVENOUS | Status: AC
  Administered 2019-07-27: 10:00:00 via INTRAVENOUS
  Filled 2019-07-27: qty 250

## 2019-07-27 MED ORDER — SODIUM CHLORIDE 0.9% FLUSH
10.0000 mL | INTRAVENOUS | Status: DC | PRN
  Administered 2019-07-27: 10 mL via INTRAVENOUS
  Filled 2019-07-27: qty 10

## 2019-07-27 MED ORDER — SODIUM CHLORIDE 0.9 % IV SOLN
4.0000 g | Freq: Once | INTRAVENOUS | Status: AC
  Administered 2019-07-27: 4 g via INTRAVENOUS
  Filled 2019-07-27: qty 8

## 2019-07-27 MED ORDER — HYOSCYAMINE SULFATE 0.125 MG SL SUBL: 0.1250 mg | SUBLINGUAL_TABLET | Freq: Four times a day (QID) | SUBLINGUAL | 2 refills | Status: AC | PRN

## 2019-07-27 MED ORDER — HEPARIN SOD (PORK) LOCK FLUSH 100 UNIT/ML IV SOLN
500.0000 [IU] | INTRAVENOUS | Status: DC | PRN
  Administered 2019-07-27: 500 [IU] via INTRAVENOUS
  Filled 2019-07-27: qty 5

## 2019-07-27 MED ORDER — MORPHINE SULFATE ER 60 MG PO TBCR: 60.0000 mg | EXTENDED_RELEASE_TABLET | Freq: Two times a day (BID) | ORAL | 0 refills | Status: DC

## 2019-07-27 MED ORDER — DIPHENOXYLATE-ATROPINE 2.5-0.025 MG PO TABS: 2.0000 | ORAL_TABLET | Freq: Four times a day (QID) | ORAL | 1 refills | Status: DC | PRN

## 2019-07-27 MED ORDER — RIVAROXABAN 15 MG PO TABS: 15.0000 mg | ORAL_TABLET | Freq: Every day | ORAL | 3 refills | Status: DC

## 2019-07-27 MED FILL — oxyCODONE HCL 10 MG TABS: 10 | 20 days supply | Qty: 120 | Fill #0

## 2019-07-27 MED FILL — OSCIMIN SL 0.125 MG TABLET: 0.125 | 7 days supply | Qty: 30 | Fill #0

## 2019-07-27 MED FILL — XARELTO 15 MG TABLET: 15 | 30 days supply | Qty: 30 | Fill #0

## 2019-07-27 MED FILL — DIPHENOXYLATE-ATROPINE 2.5-: 2.5-0.025 | 11 days supply | Qty: 90 | Fill #0

## 2019-07-27 NOTE — Telephone Encounter (Signed)
Scheduled appt per 7/30 los. ° °Patient will get a print out after treatment. °

## 2019-07-27 NOTE — Patient Instructions (Signed)
Hypomagnesemia Hypomagnesemia is a condition in which the level of magnesium in the blood is low. Magnesium is a mineral that is found in many foods. It is used in many different processes in the body. Hypomagnesemia can affect every organ in the body. In severe cases, it can cause life-threatening problems. What are the causes? This condition may be caused by:  Not getting enough magnesium in your diet.  Malnutrition.  Problems with absorbing magnesium from the intestines.  Dehydration.  Alcohol abuse.  Vomiting.  Severe or chronic diarrhea.  Some medicines, including medicines that make you urinate more (diuretics).  Certain diseases, such as kidney disease, diabetes, celiac disease, and overactive thyroid. What are the signs or symptoms? Symptoms of this condition include:  Loss of appetite.  Nausea and vomiting.  Involuntary shaking or trembling of a body part (tremor).  Muscle weakness.  Tingling in the arms and legs.  Sudden tightening of muscles (muscle spasms).  Confusion.  Psychiatric issues, such as depression, irritability, or psychosis.  A feeling of fluttering of the heart.  Seizures. These symptoms are more severe if magnesium levels drop suddenly. How is this diagnosed? This condition may be diagnosed based on:  Your symptoms and medical history.  A physical exam.  Blood and urine tests. How is this treated? Treatment depends on the cause and the severity of the condition. It may be treated with:  A magnesium supplement. This can be taken in pill form. If the condition is severe, magnesium is usually given through an IV.  Changes to your diet. You may be directed to eat foods that have a lot of magnesium, such as green leafy vegetables, peas, beans, and nuts.  Stopping any intake of alcohol. Follow these instructions at home:      Make sure that your diet includes foods with magnesium. Foods that have a lot of magnesium in them include:  ? Green leafy vegetables, such as spinach and broccoli. ? Beans and peas. ? Nuts and seeds, such as almonds and sunflower seeds. ? Whole grains, such as whole grain bread and fortified cereals.  Take magnesium supplements if your health care provider tells you to do that. Take them as directed.  Take over-the-counter and prescription medicines only as told by your health care provider.  Have your magnesium levels monitored as told by your health care provider.  When you are active, drink fluids that contain electrolytes.  Avoid drinking alcohol.  Keep all follow-up visits as told by your health care provider. This is important. Contact a health care provider if:  You get worse instead of better.  Your symptoms return. Get help right away if you:  Develop severe muscle weakness.  Have trouble breathing.  Feel that your heart is racing. Summary  Hypomagnesemia is a condition in which the level of magnesium in the blood is low.  Hypomagnesemia can affect every organ in the body.  Treatment may include eating more foods that contain magnesium, taking magnesium supplements, and not drinking alcohol.  Have your magnesium levels monitored as told by your health care provider. This information is not intended to replace advice given to you by your health care provider. Make sure you discuss any questions you have with your health care provider. Document Released: 09/09/2005 Document Revised: 11/26/2017 Document Reviewed: 11/15/2017 Elsevier Patient Education  2020 Elsevier Inc.  

## 2019-07-27 NOTE — Progress Notes (Signed)
CRITICAL VALUE STICKER  CRITICAL VALUE: Magnesium 1.0  RECEIVER (on-site recipient of call):Koreena Joost [place] NOTIFIED: 07/27/2019 930  MESSENGER (representative from lab):Verdis Frederickson  MD NOTIFIED: [name]  TIME OF NOTIFICATION:   0940  RESPONSE: Received orders for 4 gm Magnesium

## 2019-07-27 NOTE — Telephone Encounter (Signed)
Oral Oncology Pharmacist Encounter  Received notification from infusion pharmacy that MD plans to start Enon (regorafenib) for the treatment of metastatic colon cancer, heavily pretreated, in conjunction with Opdivo (nivolumab), planned duration until disease progression or unacceptable toxicity.  Patient is now under evaluation to initiate treatment with regorafenib 80mg  by mouth once daily for 3 weeks on, 1 week off, repeated every 4 weeks plus nivolumab at 3mg /kg IV q 2-4 weeks based on positive phase 1b REGONIVO trial data  Labs from 07/27/19 assessed, OK for treatment initiation. BPs in Epic reviewed, most readings WNL or below, will continue to be monitored  Current medication list in Epic reviewed, no significant DDIs with Stivarga or Opdivo identified:  Prescription will be e-scribed to the [facility] for benefits analysis and approval once received from MD.  Oral Oncology Clinic will continue to follow for insurance authorization, copayment issues, initial counseling and start date.  Johny Drilling, PharmD, BCPS, BCOP  07/27/2019 1:57 PM Oral Oncology Clinic [phone]

## 2019-07-27 NOTE — Patient Instructions (Signed)

## 2019-07-29 ENCOUNTER — Encounter: Payer: Self-pay | Admitting: Hematology

## 2019-07-29 MED ORDER — REGORAFENIB 40 MG PO TABS: 80.0000 mg | ORAL_TABLET | Freq: Every day | ORAL | 1 refills | Status: DC

## 2019-07-29 NOTE — Progress Notes (Signed)
DISCONTINUE ON PATHWAY REGIMEN - Colorectal     A cycle is every 14 days:     Irinotecan      Leucovorin      5-Fluorouracil      5-Fluorouracil   **Always confirm dose/schedule in your pharmacy ordering system**  REASON: Disease Progression PRIOR TREATMENT: [id number]: FOLFIRI TREATMENT RESPONSE: Progressive Disease (PD)  START OFF PATHWAY REGIMEN - Colorectal   [id number]:Nivolumab 3 mg/kg q14 Days:   A cycle is every 14 days:     Nivolumab   **Always confirm dose/schedule in your pharmacy ordering system**  Patient Characteristics: Distant Metastases, Nonsurgical Candidate, KRAS/NRAS Wild-Type (BRAF V600 Wild-Type/Unknown), Targeted/Immunotherapy, MSS/pMMR and HER2 Negative/Unknown Tumor Location: Colon Therapeutic Status: Distant Metastases Microsatellite/Mismatch Repair Status: MSS/pMMR BRAF Mutation Status: Wild-Type (no mutation) KRAS/NRAS Mutation Status: Wild-Type (no mutation) Preferred Therapy Approach: Targeted/Immunotherapy HER2 Status: Negative Intent of Therapy: Non-Curative / Palliative Intent, Discussed with Patient

## 2019-08-04 MED ORDER — REGORAFENIB 40 MG PO TABS: 80.0000 mg | ORAL_TABLET | Freq: Every day | ORAL | 1 refills | Status: DC

## 2019-08-04 NOTE — Telephone Encounter (Signed)
Oral Chemotherapy Pharmacist Encounter   I spoke with patient's daughter, Jeff [name] for overview of: Stivarga (regorafenib) for the treatment of metastatic colon cancer, heavily pretreated, in conjunction with Opdivo (nivolumab), planned duration until disease progression or unacceptable toxicity..  Counseled on administration, dosing, side effects, monitoring, drug-food interactions, safe handling, storage, and disposal.  For weeks 1-3: Patient will take Stivarga 40mg  tablets, 2 tablets (80mg ) by mouth once daily with a glass of water and a low fat meal of <600 calories, and <30% fat content.  Patient will be off Stivarga for week 4.  Patient should avoid grapefruit and grapefruit juice while on therapy with Stivarga.  Stivarga start date: 08/11/2019  Adverse effects include but are not limited to: hand-foot syndrome, diarrhea, nausea, abdominal pain, musculoskeletal pain, fatigue, hypertension, delayed wound healing, decreased blood counts, and hepatotoxicity.    Patient has anti-emetic on hand and knows to take it if nausea develops.   Patient has anti diarrheal on hand and will alert the office of 4 or more loose stools above baseline.  Reviewed importance of keeping a medication schedule and plan for any missed doses.  Stivarga should be discontinued 2 weeks prior to any planned surgery and resumed postsurgery based on clinical judgement of wound healing.  Medication reconciliation performed and medication/allergy list updated.  Insurance authorization for Marylyn Ishihara was not required at this time. Patient has active Medicaid prescription coverage and copayment will be $3 per 4 week fill. They will pick this up from the [facility] on 08/08/19.  Jeff [name] informed the pharmacy will reach out 5-7 days prior to needing next fill of Stivarga to coordinate continued medication acquisition to prevent break in therapy. She is familiar with the pharmacy's services.  All  questions answered.  Jeff [name] voiced understanding and appreciation.   They know to call the office with questions or concerns.  Johny Drilling, PharmD, BCPS, BCOP  08/04/2019   2:23 PM Oral Oncology Clinic [phone]

## 2019-08-04 NOTE — Telephone Encounter (Signed)
Oral Chemotherapy Pharmacist Encounter   Attempted to reach patient's daughter, [name], to provide update and offer for initial counseling on oral medication: Stivarga.  No answer. Voicemail box is full.  Patient with active Medicaid prescription medication coverage. We will be able to coordinate medication acquisition and perform initial counseling for planned start date 08/11/19 as soon as we are able to connect with patient's daughter.  Oral oncology clinic will continue to attempt to contact [facility].  Johny Drilling, PharmD, BCPS, BCOP  08/04/2019   10:02 AM Oral Oncology Clinic [phone]

## 2019-08-05 ENCOUNTER — Other Ambulatory Visit: Payer: Self-pay | Admitting: Nurse Practitioner

## 2019-08-05 DIAGNOSIS — E876 Hypokalemia: Secondary | ICD-10-CM

## 2019-08-05 DIAGNOSIS — C189 Malignant neoplasm of colon, unspecified: Secondary | ICD-10-CM

## 2019-08-05 DIAGNOSIS — C186 Malignant neoplasm of descending colon: Secondary | ICD-10-CM

## 2019-08-05 MED FILL — PANTOPRAZOLE SOD DR 40 MG T: 40 | 30 days supply | Qty: 30 | Fill #1

## 2019-08-07 MED FILL — MORPHINE SULF 60 MG TAB SA: 60 | 30 days supply | Qty: 60 | Fill #0

## 2019-08-08 ENCOUNTER — Other Ambulatory Visit: Payer: Self-pay

## 2019-08-08 ENCOUNTER — Emergency Department (HOSPITAL_COMMUNITY)
Admission: EM | Admit: 2019-08-08 | Discharge: 2019-08-08 | Disposition: A | Discharge status: Home / Self Care | Payer: Medicaid Other | Attending: Emergency Medicine | Admitting: Emergency Medicine

## 2019-08-08 ENCOUNTER — Encounter (HOSPITAL_COMMUNITY): Payer: Self-pay | Admitting: Emergency Medicine

## 2019-08-08 ENCOUNTER — Other Ambulatory Visit: Payer: Self-pay | Admitting: Hematology

## 2019-08-08 DIAGNOSIS — Z85038 Personal history of other malignant neoplasm of large intestine: Secondary | ICD-10-CM | POA: Insufficient documentation

## 2019-08-08 DIAGNOSIS — C186 Malignant neoplasm of descending colon: Secondary | ICD-10-CM

## 2019-08-08 DIAGNOSIS — R109 Unspecified abdominal pain: Secondary | ICD-10-CM | POA: Diagnosis not present

## 2019-08-08 DIAGNOSIS — Z79899 Other long term (current) drug therapy: Secondary | ICD-10-CM | POA: Diagnosis not present

## 2019-08-08 DIAGNOSIS — Z7901 Long term (current) use of anticoagulants: Secondary | ICD-10-CM | POA: Diagnosis not present

## 2019-08-08 DIAGNOSIS — E876 Hypokalemia: Secondary | ICD-10-CM

## 2019-08-08 DIAGNOSIS — C189 Malignant neoplasm of colon, unspecified: Secondary | ICD-10-CM

## 2019-08-08 MED ORDER — OXYCODONE HCL 5 MG PO TABS
10.0000 mg | ORAL_TABLET | Freq: Once | ORAL | Status: AC
  Administered 2019-08-08: 17:00:00 10 mg via ORAL
  Filled 2019-08-08: qty 2

## 2019-08-08 MED FILL — STIVARGA 40 MG TABLET: 40 | 28 days supply | Qty: 56 | Fill #0

## 2019-08-08 NOTE — ED Triage Notes (Signed)
Pt complaint of abdominal pain at site of colostomy; "my intestine keeps going in and out the bag."
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
## 2019-08-08 NOTE — Discharge Instructions (Addendum)
Return here for vomiting or fever

## 2019-08-09 MED FILL — POTASSIUM CHLORIDE CRYS ER: 20 | 30 days supply | Qty: 30 | Fill #0

## 2019-08-09 NOTE — Telephone Encounter (Signed)
Oral Oncology Patient Advocate Encounter  Confirmed with [facility] that Jeff [name] was picked up on 8/11 with a $3 copay.   [facility] [place] Phone [phone] Fax [phone] 08/09/2019   12:47 PM

## 2019-08-09 NOTE — Progress Notes (Signed)
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 with maintenance Vectibix.   -Unfortunately his CT CAP from 07/24/19 shows increase of mass in the splenic hilum, and enhancement of left liver lobe. There is stable retroperitoneal adenopathy.  -To better control his disease with his limited treatment options I recommended phase 1 clinical trialdata([facility], JCO 37(15), 2019)which shows good response with Regorafinib and Nivolumab in MSS metastatic colorectal cancer (objective response rate 29%). 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 I encouraged him to avoid constipation and to hold his stoma when coughing as this can cause further protrusion. I will contact his surgeon about this  -Labs reviewed, CBC and CMP WNL except Hg 8.8, calcium 8.3. Mag 1.6, CEA and iron panel still pending. He has taken his first dose of Regorafinib this morning. He will proceed with nivo today. 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 -He  previously underwent angioVac/thrombectomyfor his extensive LLL DVT.He previously had a recurrent GI bleeding from his tumor  when he was on full dose anticoagulation.  -Hepreviouslyhadbleeding at ostomy site on 04/07/19. 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 Currently on Ferrous Sulfate. Continue. -Labs reviewed,hemoglobin8.8today (08/11/19). Iron panel still pending.   5. 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 He has been coughing lately. -He takesMS Contin 361mBID and Oxycodone 1018mup to 5times a day)as needed.Pain remains controlled.Healsotakes Gabapentin for his leg pain and Levsin for abdominal muscle spasms.  -His left abdominal pain has recently worsened, but still controlled by pain meds. 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 I strongly encouraged him to avoid constipation as this can cause him to have hernia when straining. I encouraged him to use laxative as needed.  -I contact Dr. WilRedmond Pullinge recommends him to put sugar at ostomy site to reduce prolapse   6. 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 -Currently on MirtazapineandProtonix. -He has been able to gain 10 poundsrecently -He has been taking 3-4 ensure daily, I encouraged him to continue -His eating improved and has was able to gain weight, but continues to fluctuate lately.   7. Hypomagnesemia -Secondary to Vectibix,it is getting worse -He will continue oral magnesium twice daily -Vectibix has been d/c -Mag at 1.6 today (08/11/19), improved   7. Goal of care discussion, DNR/DNI -Wepreviouslydiscussed the incurable  nature of his cancer, and the overall poor prognosis, especiallyhis overall condition hasdeterioratedlately -he agreed with DNR/DNI -he was on hospice care for 2-3 weeks in March 2020,he has withdrawn from hospicedue to his medication issue.  8. Cough with phlegm  -He recently developed a cough with light colored phlegm, no fever  -He has been getting mildly SOB with exertion -I advised him to hold stoma when coughing as this can lead to hernia protrusion.   Plan -Labs reviewed and adequate to proceed with Nivo today  -Continue Regorafinib 37m66mily 3 weeks on/1 week off -Lab, flush, f/u and Nivo in 2 weeks  -I called her daughter today to update her    No problem-specific Assessment & Plan notes found for this encounter.   No orders of the defined types were placed in this encounter.  All questions were answered. The patient knows to call the clinic with any problems, questions or concerns. No barriers to learning was detected. I spent 20 minutes counseling the patient face to face. The total time spent in the appointment was 25 minutes and more than 50% was on counseling and review of test results     Santino Kinsella [name] 08/11/2019   I, AmoyJoslyn Devon acting as scribe for Eila Runyan [name].   I have reviewed the above documentation for accuracy and completeness, and I agree with the above.

## 2019-08-11 ENCOUNTER — Inpatient Hospital Stay: Payer: Medicaid Other

## 2019-08-11 ENCOUNTER — Inpatient Hospital Stay: Payer: Medicaid Other | Attending: Hematology

## 2019-08-11 ENCOUNTER — Inpatient Hospital Stay (HOSPITAL_BASED_OUTPATIENT_CLINIC_OR_DEPARTMENT_OTHER): Payer: Medicaid Other | Admitting: Hematology

## 2019-08-11 ENCOUNTER — Encounter: Payer: Self-pay | Admitting: Hematology

## 2019-08-11 ENCOUNTER — Telehealth: Payer: Self-pay

## 2019-08-11 ENCOUNTER — Other Ambulatory Visit: Payer: Self-pay

## 2019-08-11 ENCOUNTER — Telehealth: Payer: Self-pay | Admitting: Hematology

## 2019-08-11 VITALS — BP 106/77 | HR 80 | Temp 98.7°F | Resp 18 | Ht 69.0 in | Wt 108.8 lb

## 2019-08-11 DIAGNOSIS — Z79899 Other long term (current) drug therapy: Secondary | ICD-10-CM | POA: Diagnosis not present

## 2019-08-11 DIAGNOSIS — Z933 Colostomy status: Secondary | ICD-10-CM | POA: Diagnosis not present

## 2019-08-11 DIAGNOSIS — C786 Secondary malignant neoplasm of retroperitoneum and peritoneum: Secondary | ICD-10-CM | POA: Diagnosis not present

## 2019-08-11 DIAGNOSIS — D630 Anemia in neoplastic disease: Secondary | ICD-10-CM | POA: Diagnosis not present

## 2019-08-11 DIAGNOSIS — Z86718 Personal history of other venous thrombosis and embolism: Secondary | ICD-10-CM | POA: Diagnosis not present

## 2019-08-11 DIAGNOSIS — D500 Iron deficiency anemia secondary to blood loss (chronic): Secondary | ICD-10-CM

## 2019-08-11 DIAGNOSIS — Z86711 Personal history of pulmonary embolism: Secondary | ICD-10-CM | POA: Diagnosis not present

## 2019-08-11 DIAGNOSIS — C186 Malignant neoplasm of descending colon: Secondary | ICD-10-CM | POA: Insufficient documentation

## 2019-08-11 DIAGNOSIS — K435 Parastomal hernia without obstruction or  gangrene: Secondary | ICD-10-CM | POA: Insufficient documentation

## 2019-08-11 DIAGNOSIS — D638 Anemia in other chronic diseases classified elsewhere: Secondary | ICD-10-CM | POA: Diagnosis not present

## 2019-08-11 DIAGNOSIS — C762 Malignant neoplasm of abdomen: Secondary | ICD-10-CM

## 2019-08-11 DIAGNOSIS — Z66 Do not resuscitate: Secondary | ICD-10-CM | POA: Insufficient documentation

## 2019-08-11 DIAGNOSIS — Z7901 Long term (current) use of anticoagulants: Secondary | ICD-10-CM | POA: Diagnosis not present

## 2019-08-11 DIAGNOSIS — C189 Malignant neoplasm of colon, unspecified: Secondary | ICD-10-CM

## 2019-08-11 DIAGNOSIS — D509 Iron deficiency anemia, unspecified: Secondary | ICD-10-CM

## 2019-08-11 DIAGNOSIS — Z95828 Presence of other vascular implants and grafts: Secondary | ICD-10-CM

## 2019-08-11 DIAGNOSIS — Z5112 Encounter for antineoplastic immunotherapy: Secondary | ICD-10-CM | POA: Insufficient documentation

## 2019-08-11 DIAGNOSIS — R590 Localized enlarged lymph nodes: Secondary | ICD-10-CM | POA: Insufficient documentation

## 2019-08-11 LAB — CBC WITH DIFFERENTIAL (CANCER CENTER ONLY)
Abs Immature Granulocytes: 0.04 10*3/uL (ref 0.00–0.07)
Basophils Absolute: 0.1 10*3/uL (ref 0.0–0.1)
Basophils Relative: 1 %
Eosinophils Absolute: 0.1 10*3/uL (ref 0.0–0.5)
Eosinophils Relative: 1 %
HCT: 28.1 % — ABNORMAL LOW (ref 39.0–52.0)
Hemoglobin: 8.8 g/dL — ABNORMAL LOW (ref 13.0–17.0)
Immature Granulocytes: 0 %
Lymphocytes Relative: 15 %
Lymphs Abs: 1.5 10*3/uL (ref 0.7–4.0)
MCH: 26.8 pg (ref 26.0–34.0)
MCHC: 31.3 g/dL (ref 30.0–36.0)
MCV: 85.7 fL (ref 80.0–100.0)
Monocytes Absolute: 0.8 10*3/uL (ref 0.1–1.0)
Monocytes Relative: 8 %
Neutro Abs: 7.6 10*3/uL (ref 1.7–7.7)
Neutrophils Relative %: 75 %
Platelet Count: 166 10*3/uL (ref 150–400)
RBC: 3.28 MIL/uL — ABNORMAL LOW (ref 4.22–5.81)
RDW: 17.2 % — ABNORMAL HIGH (ref 11.5–15.5)
WBC Count: 10.1 10*3/uL (ref 4.0–10.5)
nRBC: 0 % (ref 0.0–0.2)

## 2019-08-11 LAB — CMP (CANCER CENTER ONLY)
ALT: <6 U/L (ref 0–44)
AST: 15 U/L (ref 15–41)
Albumin: 2 g/dL — ABNORMAL LOW (ref 3.5–5.0)
Alkaline Phosphatase: 126 U/L (ref 38–126)
Anion gap: 8 (ref 5–15)
BUN: 9 mg/dL (ref 6–20)
CO2: 24 mmol/L (ref 22–32)
Calcium: 8.3 mg/dL — ABNORMAL LOW (ref 8.9–10.3)
Chloride: 100 mmol/L (ref 98–111)
Creatinine: 0.81 mg/dL (ref 0.61–1.24)
GFR, Est AFR Am: >60 mL/min (ref >60)
GFR, Estimated: >60 mL/min (ref >60)
Glucose, Bld: 139 mg/dL — ABNORMAL HIGH (ref 70–99)
Potassium: 3.8 mmol/L (ref 3.5–5.1)
Sodium: 132 mmol/L — ABNORMAL LOW (ref 135–145)
Total Bilirubin: 0.3 mg/dL (ref 0.3–1.2)
Total Protein: 7.6 g/dL (ref 6.5–8.1)

## 2019-08-11 LAB — FERRITIN: Ferritin: 421 ng/mL — ABNORMAL HIGH (ref 24–336)

## 2019-08-11 LAB — IRON AND TIBC
Iron: 10 ug/dL — ABNORMAL LOW (ref 42–163)
Saturation Ratios: 9 % — ABNORMAL LOW (ref 20–55)
TIBC: 110 ug/dL — ABNORMAL LOW (ref 202–409)
UIBC: 100 ug/dL — ABNORMAL LOW (ref 117–376)

## 2019-08-11 LAB — CEA (IN HOUSE-CHCC): CEA (CHCC-In House): 59.28 ng/mL — ABNORMAL HIGH (ref 0.00–5.00)

## 2019-08-11 LAB — MAGNESIUM: Magnesium: 1.6 mg/dL — ABNORMAL LOW (ref 1.7–2.4)

## 2019-08-11 MED ORDER — MAGNESIUM OXIDE 400 (241.3 MG) MG PO TABS: 400.0000 mg | ORAL_TABLET | Freq: Two times a day (BID) | ORAL | 0 refills | Status: DC

## 2019-08-11 MED ORDER — SODIUM CHLORIDE 0.9 % IV SOLN
Freq: Once | INTRAVENOUS | Status: AC
  Administered 2019-08-11: 14:00:00 via INTRAVENOUS
  Filled 2019-08-11: qty 250

## 2019-08-11 MED ORDER — HEPARIN SOD (PORK) LOCK FLUSH 100 UNIT/ML IV SOLN
500.0000 [IU] | Freq: Once | INTRAVENOUS | Status: AC | PRN
  Administered 2019-08-11: 500 [IU]
  Filled 2019-08-11: qty 5

## 2019-08-11 MED ORDER — SODIUM CHLORIDE 0.9% FLUSH
10.0000 mL | INTRAVENOUS | Status: DC | PRN
  Administered 2019-08-11: 10 mL
  Filled 2019-08-11: qty 10

## 2019-08-11 MED ORDER — SODIUM CHLORIDE 0.9% FLUSH
10.0000 mL | INTRAVENOUS | Status: DC | PRN
  Administered 2019-08-11: 11:00:00 10 mL via INTRAVENOUS
  Filled 2019-08-11: qty 10

## 2019-08-11 MED ORDER — SODIUM CHLORIDE 0.9 % IV SOLN
3.0000 mg/kg | Freq: Once | INTRAVENOUS | Status: AC
  Administered 2019-08-11: 140 mg via INTRAVENOUS
  Filled 2019-08-11: qty 14

## 2019-08-11 NOTE — Telephone Encounter (Signed)
Spoke with patient's daughter [name] and per [name] instructed patient to place sugar onto his ostomy for the prolapse.  She verbalized an understanding and conveyed this to the patient.

## 2019-08-11 NOTE — Telephone Encounter (Signed)
Scheduled appt per 8/14 los.  Spoke to patient and he stated he was in treatment and will get a print while he is in treatment.

## 2019-08-11 NOTE — Patient Instructions (Signed)
[facility] Discharge Instructions for Patients Receiving Chemotherapy  Today you received the following chemotherapy agents Nivolumab  (Optivo)  To help prevent nausea and vomiting after your treatment, we encourage you to take your nausea medication as directed   If you develop nausea and vomiting that is not controlled by your nausea medication, call the clinic.   BELOW ARE SYMPTOMS THAT SHOULD BE REPORTED IMMEDIATELY:  *FEVER GREATER THAN 100.5 F  *CHILLS WITH OR WITHOUT FEVER  NAUSEA AND VOMITING THAT IS NOT CONTROLLED WITH YOUR NAUSEA MEDICATION  *UNUSUAL SHORTNESS OF BREATH  *UNUSUAL BRUISING OR BLEEDING  TENDERNESS IN MOUTH AND THROAT WITH OR WITHOUT PRESENCE OF ULCERS  *URINARY PROBLEMS  *BOWEL PROBLEMS  UNUSUAL RASH Items with * indicate a potential emergency and should be followed up as soon as possible.  Feel free to call the clinic should you have any questions or concerns. The clinic phone number is [phone].  Please show the [facility] at check-in to the Emergency Department and triage nurse.  Nivolumab injection (Opdivo) What is this medicine? NIVOLUMAB (nye VOL ue mab) is a monoclonal antibody. It is used to treat melanoma, lung cancer, kidney cancer, head and neck cancer, Hodgkin lymphoma, urothelial cancer, colon cancer, and liver cancer. This medicine may be used for other purposes; ask your health care provider or pharmacist if you have questions. COMMON BRAND NAME(S): Opdivo What should I tell my health care provider before I take this medicine? They need to know if you have any of these conditions:  diabetes  immune system problems  kidney disease  liver disease  lung disease  organ transplant  stomach or intestine problems  thyroid disease  an unusual or allergic reaction to nivolumab, other medicines, foods, dyes, or preservatives  pregnant or trying to get pregnant  breast-feeding How should I use this  medicine? This medicine is for infusion into a vein. It is given by a health care professional in a hospital or clinic setting. A special MedGuide will be given to you before each treatment. Be sure to read this information carefully each time. Talk to your pediatrician regarding the use of this medicine in children. While this drug may be prescribed for children as young as 12 years for selected conditions, precautions do apply. Overdosage: If you think you have taken too much of this medicine contact a poison control center or emergency room at once. NOTE: This medicine is only for you. Do not share this medicine with others. What if I miss a dose? It is important not to miss your dose. Call your doctor or health care professional if you are unable to keep an appointment. What may interact with this medicine? Interactions have not been studied. Give your health care provider a list of all the medicines, herbs, non-prescription drugs, or dietary supplements you use. Also tell them if you smoke, drink alcohol, or use illegal drugs. Some items may interact with your medicine. This list may not describe all possible interactions. Give your health care provider a list of all the medicines, herbs, non-prescription drugs, or dietary supplements you use. Also tell them if you smoke, drink alcohol, or use illegal drugs. Some items may interact with your medicine. What should I watch for while using this medicine? This drug may make you feel generally unwell. Continue your course of treatment even though you feel ill unless your doctor tells you to stop. You may need blood work done while you are taking this medicine.  Do not become pregnant while taking this medicine or for 5 months after stopping it. Women should inform their doctor if they wish to become pregnant or think they might be pregnant. There is a potential for serious side effects to an unborn child. Talk to your health care professional or  pharmacist for more information. Do not breast-feed an infant while taking this medicine or for 5 months after stopping it. What side effects may I notice from receiving this medicine? Side effects that you should report to your doctor or health care professional as soon as possible:  allergic reactions like skin rash, itching or hives, swelling of the face, lips, or tongue  breathing problems  blood in the urine  bloody or watery diarrhea or black, tarry stools  changes in emotions or moods  changes in vision  chest pain  cough  dizziness  feeling faint or lightheaded, falls  fever, chills  headache with fever, neck stiffness, confusion, loss of memory, sensitivity to light, hallucination, loss of contact with reality, or seizures  joint pain  mouth sores  redness, blistering, peeling or loosening of the skin, including inside the mouth  severe muscle pain or weakness  signs and symptoms of high blood sugar such as dizziness; dry mouth; dry skin; fruity breath; nausea; stomach pain; increased hunger or thirst; increased urination  signs and symptoms of kidney injury like trouble passing urine or change in the amount of urine  signs and symptoms of liver injury like dark yellow or brown urine; general ill feeling or flu-like symptoms; light-colored stools; loss of appetite; nausea; right upper belly pain; unusually weak or tired; yellowing of the eyes or skin  swelling of the ankles, feet, hands  trouble passing urine or change in the amount of urine  unusually weak or tired  weight gain or loss Side effects that usually do not require medical attention (report to your doctor or health care professional if they continue or are bothersome):  bone pain  constipation  decreased appetite  diarrhea  muscle pain  nausea, vomiting  tiredness This list may not describe all possible side effects. Call your doctor for medical advice about side effects. You may  report side effects to FDA at 1-800-FDA-1088. Where should I keep my medicine? This drug is given in a hospital or clinic and will not be stored at home. NOTE: This sheet is a summary. It may not cover all possible information. If you have questions about this medicine, talk to your doctor, pharmacist, or health care provider.  2020 Elsevier/Gold Standard (2018-05-04 12:55:04)      Hypomagnesemia (Low magnesium) Hypomagnesemia is a condition in which the level of magnesium in the blood is low. Magnesium is a mineral that is found in many foods. It is used in many different processes in the body. Hypomagnesemia can affect every organ in the body. In severe cases, it can cause life-threatening problems. What are the causes? This condition may be caused by:  Not getting enough magnesium in your diet.  Malnutrition.  Problems with absorbing magnesium from the intestines.  Dehydration.  Alcohol abuse.  Vomiting.  Severe or chronic diarrhea.  Some medicines, including medicines that make you urinate more (diuretics).  Certain diseases, such as kidney disease, diabetes, celiac disease, and overactive thyroid. What are the signs or symptoms? Symptoms of this condition include:  Loss of appetite.  Nausea and vomiting.  Involuntary shaking or trembling of a body part (tremor).  Muscle weakness.  Tingling in the  arms and legs.  Sudden tightening of muscles (muscle spasms).  Confusion.  Psychiatric issues, such as depression, irritability, or psychosis.  A feeling of fluttering of the heart.  Seizures. These symptoms are more severe if magnesium levels drop suddenly. How is this diagnosed? This condition may be diagnosed based on:  Your symptoms and medical history.  A physical exam.  Blood and urine tests. How is this treated? Treatment depends on the cause and the severity of the condition. It may be treated with:  A magnesium supplement. This can be taken in  pill form. If the condition is severe, magnesium is usually given through an IV.  Changes to your diet. You may be directed to eat foods that have a lot of magnesium, such as green leafy vegetables, peas, beans, and nuts.  Stopping any intake of alcohol. Follow these instructions at home:      Make sure that your diet includes foods with magnesium. Foods that have a lot of magnesium in them include: ? Green leafy vegetables, such as spinach and broccoli. ? Beans and peas. ? Nuts and seeds, such as almonds and sunflower seeds. ? Whole grains, such as whole grain bread and fortified cereals.  Take magnesium supplements if your health care provider tells you to do that. Take them as directed.  Take over-the-counter and prescription medicines only as told by your health care provider.  Have your magnesium levels monitored as told by your health care provider.  When you are active, drink fluids that contain electrolytes.  Avoid drinking alcohol.  Keep all follow-up visits as told by your health care provider. This is important. Contact a health care provider if:  You get worse instead of better.  Your symptoms return. Get help right away if you:  Develop severe muscle weakness.  Have trouble breathing.  Feel that your heart is racing. Summary  Hypomagnesemia is a condition in which the level of magnesium in the blood is low.  Hypomagnesemia can affect every organ in the body.  Treatment may include eating more foods that contain magnesium, taking magnesium supplements, and not drinking alcohol.  Have your magnesium levels monitored as told by your health care provider. This information is not intended to replace advice given to you by your health care provider. Make sure you discuss any questions you have with your health care provider. Document Released: 09/09/2005 Document Revised: 11/26/2017 Document Reviewed: 11/15/2017 Elsevier Patient Education  2020 [employer].

## 2019-08-11 NOTE — Telephone Encounter (Signed)
-----   Message from [name], MD sent at 08/11/2019  3:38 PM EDT ----- Thanks much Eric.   He is not a candidate for surgery, he is close to hospice.  [name], please let pt try placing sugar onto his ostomy for his prolapse.  Thanks  Krista Blue  ----- Message ----- From: [name], MD Sent: 08/11/2019  12:30 PM EDT To: [name], MD  Placing actual sugar onto the ostomy itself can sometimes reduce it.   Not sure if he is a candidate for revision but he could talk with alicia/steve or chris and get their thoughts. I guess you could put on tumor board so they could see imaging and see if there is a surgical option for his prolapse.   Thanks Randall Hiss ----- Message ----- From: [name], MD Sent: 08/11/2019  11:42 AM EDT To: [name], MD  Hi Dr. 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   Thanks  Krista Blue

## 2019-08-12 ENCOUNTER — Encounter: Payer: Self-pay | Admitting: Hematology

## 2019-08-14 MED FILL — GABAPENTIN 300 MG CAPSULE: 300 | 30 days supply | Qty: 90 | Fill #0

## 2019-08-14 MED FILL — MIRTAZAPINE 15 MG TABLET: 15 | 30 days supply | Qty: 30 | Fill #1

## 2019-08-18 ENCOUNTER — Other Ambulatory Visit: Payer: Self-pay | Admitting: Hematology

## 2019-08-18 DIAGNOSIS — C189 Malignant neoplasm of colon, unspecified: Secondary | ICD-10-CM

## 2019-08-18 MED ORDER — GABAPENTIN 300 MG PO CAPS: 300.0000 mg | ORAL_CAPSULE | Freq: Three times a day (TID) | ORAL | 1 refills | Status: AC

## 2019-08-21 MED FILL — GABAPENTIN 300 MG CAPSULE: 300 | 30 days supply | Qty: 90 | Fill #0

## 2019-08-21 MED FILL — MIRTAZAPINE 15 MG TABLET: 15 | 30 days supply | Qty: 30 | Fill #1

## 2019-08-24 ENCOUNTER — Other Ambulatory Visit: Payer: Self-pay

## 2019-08-24 ENCOUNTER — Encounter: Payer: Self-pay | Admitting: Nurse Practitioner

## 2019-08-24 ENCOUNTER — Inpatient Hospital Stay: Payer: Medicaid Other

## 2019-08-24 ENCOUNTER — Inpatient Hospital Stay (HOSPITAL_BASED_OUTPATIENT_CLINIC_OR_DEPARTMENT_OTHER): Payer: Medicaid Other | Admitting: Nurse Practitioner

## 2019-08-24 ENCOUNTER — Encounter: Payer: Self-pay | Admitting: Nutrition

## 2019-08-24 VITALS — BP 114/88 | HR 104 | Temp 99.1°F | Resp 16 | Wt 100.5 lb

## 2019-08-24 DIAGNOSIS — D509 Iron deficiency anemia, unspecified: Secondary | ICD-10-CM

## 2019-08-24 DIAGNOSIS — C189 Malignant neoplasm of colon, unspecified: Secondary | ICD-10-CM

## 2019-08-24 DIAGNOSIS — Z95828 Presence of other vascular implants and grafts: Secondary | ICD-10-CM | POA: Diagnosis not present

## 2019-08-24 DIAGNOSIS — D630 Anemia in neoplastic disease: Secondary | ICD-10-CM

## 2019-08-24 DIAGNOSIS — E876 Hypokalemia: Secondary | ICD-10-CM

## 2019-08-24 DIAGNOSIS — C186 Malignant neoplasm of descending colon: Secondary | ICD-10-CM

## 2019-08-24 DIAGNOSIS — D500 Iron deficiency anemia secondary to blood loss (chronic): Secondary | ICD-10-CM | POA: Diagnosis not present

## 2019-08-24 DIAGNOSIS — Z5112 Encounter for antineoplastic immunotherapy: Secondary | ICD-10-CM | POA: Diagnosis not present

## 2019-08-24 LAB — CBC WITH DIFFERENTIAL (CANCER CENTER ONLY)
Abs Immature Granulocytes: 0.02 10*3/uL (ref 0.00–0.07)
Basophils Absolute: 0.1 10*3/uL (ref 0.0–0.1)
Basophils Relative: 2 %
Eosinophils Absolute: 0.1 10*3/uL (ref 0.0–0.5)
Eosinophils Relative: 2 %
HCT: 31.2 % — ABNORMAL LOW (ref 39.0–52.0)
Hemoglobin: 9.8 g/dL — ABNORMAL LOW (ref 13.0–17.0)
Immature Granulocytes: 0 %
Lymphocytes Relative: 30 %
Lymphs Abs: 2 10*3/uL (ref 0.7–4.0)
MCH: 26.4 pg (ref 26.0–34.0)
MCHC: 31.4 g/dL (ref 30.0–36.0)
MCV: 84.1 fL (ref 80.0–100.0)
Monocytes Absolute: 0.6 10*3/uL (ref 0.1–1.0)
Monocytes Relative: 9 %
Neutro Abs: 3.9 10*3/uL (ref 1.7–7.7)
Neutrophils Relative %: 57 %
Platelet Count: 167 10*3/uL (ref 150–400)
RBC: 3.71 MIL/uL — ABNORMAL LOW (ref 4.22–5.81)
RDW: 16.4 % — ABNORMAL HIGH (ref 11.5–15.5)
WBC Count: 6.7 10*3/uL (ref 4.0–10.5)
nRBC: 0 % (ref 0.0–0.2)

## 2019-08-24 LAB — TSH: TSH: 1.701 u[IU]/mL (ref 0.320–4.118)

## 2019-08-24 LAB — CMP (CANCER CENTER ONLY)
ALT: <6 U/L (ref 0–44)
AST: 17 U/L (ref 15–41)
Albumin: 1.9 g/dL — ABNORMAL LOW (ref 3.5–5.0)
Alkaline Phosphatase: 116 U/L (ref 38–126)
Anion gap: 4 — ABNORMAL LOW (ref 5–15)
BUN: 6 mg/dL (ref 6–20)
CO2: 24 mmol/L (ref 22–32)
Calcium: 7.8 mg/dL — ABNORMAL LOW (ref 8.9–10.3)
Chloride: 102 mmol/L (ref 98–111)
Creatinine: 0.76 mg/dL (ref 0.61–1.24)
GFR, Est AFR Am: >60 mL/min (ref >60)
GFR, Estimated: >60 mL/min (ref >60)
Glucose, Bld: 112 mg/dL — ABNORMAL HIGH (ref 70–99)
Potassium: 3.9 mmol/L (ref 3.5–5.1)
Sodium: 130 mmol/L — ABNORMAL LOW (ref 135–145)
Total Bilirubin: <0.2 mg/dL — ABNORMAL LOW (ref 0.3–1.2)
Total Protein: 8.3 g/dL — ABNORMAL HIGH (ref 6.5–8.1)

## 2019-08-24 LAB — MAGNESIUM: Magnesium: 1.7 mg/dL (ref 1.7–2.4)

## 2019-08-24 MED ORDER — HEPARIN SOD (PORK) LOCK FLUSH 100 UNIT/ML IV SOLN
500.0000 [IU] | Freq: Once | INTRAVENOUS | Status: AC
  Administered 2019-08-24: 500 [IU] via INTRAVENOUS
  Filled 2019-08-24: qty 5

## 2019-08-24 MED ORDER — POTASSIUM CHLORIDE CRYS ER 20 MEQ PO TBCR: 20.0000 meq | EXTENDED_RELEASE_TABLET | Freq: Every day | ORAL | 0 refills | Status: DC

## 2019-08-24 MED ORDER — MORPHINE SULFATE ER 60 MG PO TBCR: 60.0000 mg | EXTENDED_RELEASE_TABLET | Freq: Two times a day (BID) | ORAL | 0 refills | Status: DC

## 2019-08-24 MED ORDER — DIPHENOXYLATE-ATROPINE 2.5-0.025 MG PO TABS: 2.0000 | ORAL_TABLET | Freq: Four times a day (QID) | ORAL | 1 refills | Status: AC | PRN

## 2019-08-24 MED ORDER — SODIUM CHLORIDE 0.9% FLUSH
10.0000 mL | INTRAVENOUS | Status: DC | PRN
  Administered 2019-08-24: 10 mL via INTRAVENOUS
  Filled 2019-08-24: qty 10

## 2019-08-24 MED ORDER — DIPHENOXYLATE-ATROPINE 2.5-0.025 MG PO TABS
2.0000 | ORAL_TABLET | Freq: Once | ORAL | Status: AC
  Administered 2019-08-24: 10:00:00 2 via ORAL

## 2019-08-24 MED ORDER — SODIUM CHLORIDE 0.9% FLUSH
10.0000 mL | Freq: Once | INTRAVENOUS | Status: AC
  Administered 2019-08-24: 10 mL via INTRAVENOUS
  Filled 2019-08-24: qty 10

## 2019-08-24 MED ORDER — SODIUM CHLORIDE 0.9 % IV SOLN
Freq: Once | INTRAVENOUS | Status: AC
  Administered 2019-08-24: 10:00:00 via INTRAVENOUS
  Filled 2019-08-24: qty 250

## 2019-08-24 MED ORDER — OXYCODONE HCL 10 MG PO TABS: 10.0000 mg | ORAL_TABLET | ORAL | 0 refills | Status: DC | PRN

## 2019-08-24 MED ORDER — DIPHENOXYLATE-ATROPINE 2.5-0.025 MG PO TABS
ORAL_TABLET | ORAL | Status: AC
  Filled 2019-08-24: qty 2

## 2019-08-24 NOTE — Progress Notes (Signed)
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 -Healsotakes Gabapentin for his leg painand Levsin for abdominal muscle spasms. -His left abdominal pain has recently worsened, but still controlled by pain meds. 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  -Dr. FenBurr Medicoviewed to avoid constipation and brace the stoma when coughing   6. 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 -Currently on MirtazapineandProtonix. -weight trending down. His daughter reports very little intake -he experienced anorexia with Regorafenib/Nivo -Will see if nutrition can give him more ensure supply  7. Hypomagnesemia -Secondary to Vectibix,which has been  discontinued  -resolved  7. Goal of care discussion, DNR/DNI -he was on hospice care for 2-3 weeks in March 2020,he has withdrawn from hospicedue to his medication issue. -He wishes to continue treatment if he is able,  but understands the goal is palliative. His performance status is low, I recommend palliative care. His daughter agreed and I placed the referral (08/24/19).  8. Cough with phlegm  -not reported today  Disposition:  Mr. Bulson appears lethargic. He completed first cycle Nivo and continues Regorafenib, currently on day 13 of pills. He is not tolerating well, with severe fatigue and anorexia. He has lost more weight. He appears dehydrated. Labs reviewed. I recommend to hold Nivolumab and Regorafinib. He will receive IV fluids and lomotil today for liquid stool in collection bag, lethargy, and tachycardia. I will ask nutrition to give him more Ensure.   Due to his poor performance status, I recommend palliative care at home. I discussed with his daughter on the phone and she agrees. When he returns for f/u in 2 weeks, will discuss further treatment vs comfort care. I reviewed the plan with [name]. I refilled lomotil, potassium, and pain meds today.     Orders Placed This Encounter  Procedures   Amb Referral to Palliative Care    Referral Priority:   Urgent    Referral Type:   Consultation    Number of Visits Requested:   1   All questions were answered. The patient knows to call the clinic with any problems, questions or concerns. No barriers to learning was detected. I spent 20 minutes counseling the patient face to face. The total time spent in the appointment was 25 minutes and more than 50% was on counseling and review of test results     Alla Feeling, NP 08/24/19

## 2019-08-24 NOTE — Patient Instructions (Signed)
Rehydration, Adult Rehydration is the replacement of body fluids and salts and minerals (electrolytes) that are lost during dehydration. Dehydration is when there is not enough fluid or water in the body. This happens when you lose more fluids than you take in. Common causes of dehydration include:  Vomiting.  Diarrhea.  Excessive sweating, such as from heat exposure or exercise.  Taking medicines that cause the body to lose excess fluid (diuretics).  Impaired kidney function.  Not drinking enough fluid.  Certain illnesses or infections.  Certain poorly controlled long-term (chronic) illnesses, such as diabetes, heart disease, and kidney disease.  Symptoms of mild dehydration may include thirst, dry lips and mouth, dry skin, and dizziness. Symptoms of severe dehydration may include increased heart rate, confusion, fainting, and not urinating. You can rehydrate by drinking certain fluids or getting fluids through an IV tube, as told by your health care provider. What are the risks? Generally, rehydration is safe. However, one problem that can happen is taking in too much fluid (overhydration). This is rare. If overhydration happens, it can cause an electrolyte imbalance, kidney failure, or a decrease in salt (sodium) levels in the body. How to rehydrate Follow instructions from your health care provider for rehydration. The kind of fluid you should drink and the amount you should drink depend on your condition.  If directed by your health care provider, drink an oral rehydration solution (ORS). This is a drink designed to treat dehydration that is found in pharmacies and retail stores. ? Make an ORS by following instructions on the package. ? Start by drinking small amounts, about  cup (120 mL) every 5-10 minutes. ? Slowly increase how much you drink until you have taken the amount recommended by your health care provider.  Drink enough clear fluids to keep your urine clear or pale  yellow. If you were instructed to drink an ORS, finish the ORS first, then start slowly drinking other clear fluids. Drink fluids such as: ? Water. Do not drink only water. Doing that can lead to having too little sodium in your body (hyponatremia). ? Ice chips. ? Fruit juice that you have added water to (diluted juice). ? Low-calorie sports drinks.  If you are severely dehydrated, your health care provider may recommend that you receive fluids through an IV tube in the hospital.  Do not take sodium tablets. Doing that can lead to the condition of having too much sodium in your body (hypernatremia). Eating while you rehydrate Follow instructions from your health care provider about what to eat while you rehydrate. Your health care provider may recommend that you slowly begin eating regular foods in small amounts.  Eat foods that contain a healthy balance of electrolytes, such as bananas, oranges, potatoes, tomatoes, and spinach.  Avoid foods that are greasy or contain a lot of fat or sugar.  In some cases, you may get nutrition through a feeding tube that is passed through your nose and into your stomach (nasogastric tube, or NG tube). This may be done if you have uncontrolled vomiting or diarrhea. Beverages to avoid Certain beverages may make dehydration worse. While you rehydrate, avoid:  Alcohol.  Caffeine.  Drinks that contain a lot of sugar. These include: ? High-calorie sports drinks. ? Fruit juice that is not diluted. ? Soda.  Check nutrition labels to see how much sugar or caffeine a beverage contains. Signs of dehydration recovery You may be recovering from dehydration if:  You are urinating more often than before you started  rehydrating.  Your urine is clear or pale yellow.  Your energy level improves.  You vomit less frequently.  You have diarrhea less frequently.  Your appetite improves or returns to normal.  You feel less dizzy or less light-headed.  Your  skin tone and color start to look more normal. Contact a health care provider if:  You continue to have symptoms of mild dehydration, such as: ? Thirst. ? Dry lips. ? Slightly dry mouth. ? Dry, warm skin. ? Dizziness.  You continue to vomit or have diarrhea. Get help right away if:  You have symptoms of dehydration that get worse.  You feel: ? Confused. ? Weak. ? Like you are going to faint.  You have not urinated in 6-8 hours.  You have very dark urine.  You have trouble breathing.  Your heart rate while sitting still is over 100 beats a minute.  You cannot drink fluids without vomiting.  You have vomiting or diarrhea that: ? Gets worse. ? Does not go away.  You have a fever. This information is not intended to replace advice given to you by your health care provider. Make sure you discuss any questions you have with your health care provider. Document Released: 03/07/2012 Document Revised: 11/26/2017 Document Reviewed: 02/07/2016 Elsevier Patient Education  [employer].  Atropine; Diphenoxylate oral liquid What is this medicine? ATROPINE; DIPHENOXYLATE (A troe peen dye fen OX i late) is used to treat diarrhea. This medicine may be used for other purposes; ask your health care provider or pharmacist if you have questions. COMMON BRAND NAME(S): Lomotil What should I tell my health care provider before I take this medicine? They need to know if you have any of these conditions:  bacterial food poisoning  colitis  dehydration  Down's syndrome  jaundice or liver disease  an unusual or allergic reaction to atropine, diphenoxylate, other medicines, foods, dyes, or preservatives  pregnant or trying to get pregnant  breast-feeding How should I use this medicine? Take this medicine by mouth. Follow the directions on the prescription label. Use a specially marked spoon or dropper to measure your medicine. Ask your pharmacist if you do not have one.  Household spoons are not accurate. Take your medicine at regular intervals. Do not take it more often than directed. Once your diarrhea has been brought under control your doctor or health care professional may reduce your doses. Talk to your pediatrician regarding the use of this medicine in children. Special care may be needed. Overdosage: If you think you have taken too much of this medicine contact a poison control center or emergency room at once. NOTE: This medicine is only for you. Do not share this medicine with others. What if I miss a dose? If you miss a dose, take it as soon as you can. If it is almost time for your next dose, take only that dose. Do not take double or extra doses. What may interact with this medicine?  alcohol  antihistamines for allergy, cough and cold  barbiturate medicines for inducing sleep or treating seizures  certain medicines for depression, anxiety, or psychotic disturbances  certain medicines for sleep  medicines for movement abnormalities as in Parkinson's disease, or for gastrointestinal problems  muscle relaxants  narcotic medicines (opiates) for pain This list may not describe all possible interactions. Give your health care provider a list of all the medicines, herbs, non-prescription drugs, or dietary supplements you use. Also tell them if you smoke, drink alcohol, or use  illegal drugs. Some items may interact with your medicine. What should I watch for while using this medicine? If your symptoms do not start to get better after taking this medicine for two days, check with your doctor or health care professional, you may have a problem that needs further evaluation. Check with your doctor or health care professional right away if you develop a fever or bloody diarrhea. You may get drowsy or dizzy. Do not drive, use machinery, or do anything that needs mental alertness until you know how this medicine affects you. Do not stand or sit up quickly,  especially if you are an older patient. This reduces the risk of dizzy or fainting spells. Alcohol may interfere with the effect of this medicine. Avoid alcoholic drinks. Your mouth may get dry. Chewing sugarless gum or sucking hard candy, and drinking plenty of water may help. Contact your doctor if the problem does not go away or is severe. Drinking plenty of water can also help prevent dehydration that can occur with diarrhea. What side effects may I notice from receiving this medicine? Side effects that you should report to your doctor or health care professional as soon as possible:  allergic reactions like skin rash, itching or hives, swelling of the face, lips, or tongue  bloated, swollen feeling  breathing problems  changes in vision  fast, irregular heartbeat  stomach pain Side effects that usually do not require medical attention (report to your doctor or health care professional if they continue or are bothersome):  headache  loss of appetite  mood changes  nausea, vomiting  pain, tingling, numbness in the hands or feet This list may not describe all possible side effects. Call your doctor for medical advice about side effects. You may report side effects to FDA at 1-800-FDA-1088. Where should I keep my medicine? Keep out of the reach of children. This medicine can be abused. Keep your medicine in a safe place to protect it from theft. Do not share this medicine with anyone. Selling or giving away this medicine is dangerous and against the law. This medicine may cause accidental overdose and death if taken by other adults, children, or pets. Mix any unused medicine with a substance like cat litter or coffee grounds. Then throw the medicine away in a sealed container like a sealed bag or a coffee can with a lid. Do not use the medicine after the expiration date. Store at room temperature between 15 and 30 degrees C (59 and 86 degrees F). Do not freeze. Protect from light.  Keep container tightly closed. NOTE: This sheet is a summary. It may not cover all possible information. If you have questions about this medicine, talk to your doctor, pharmacist, or health care provider.  2020 Elsevier/Gold Standard (2016-10-20 13:26:05)  Coronavirus (COVID-19) Are you at risk?  Are you at risk for the Coronavirus (COVID-19)?  To be considered HIGH RISK for Coronavirus (COVID-19), you have to meet the following criteria:   Traveled to [place], [place], [place], [place] or [place]; or in the [place] to [place], [place], [place], or [place]; and have fever, cough, and shortness of breath within the last 2 weeks of travel OR  Been in close contact with a person diagnosed with COVID-19 within the last 2 weeks and have fever, cough, and shortness of breath  IF YOU DO NOT MEET THESE CRITERIA, YOU ARE CONSIDERED LOW RISK FOR COVID-19.  What to do if you are HIGH RISK for COVID-19?  If you are having a medical emergency, call 911.  Seek medical care right away. Before you go to a doctors office, urgent care or emergency department, call ahead and tell them about your recent travel, contact with someone diagnosed with COVID-19, and your symptoms. You should receive instructions from your physicians office regarding next steps of care.   When you arrive at healthcare provider, tell the healthcare staff immediately you have returned from visiting [place], [place], [place], [place] or [place]; or traveled in the [place] to [place], [place], [place], or [place]; in the last two weeks or you have been in close contact with a person diagnosed with COVID-19 in the last 2 weeks.    Tell the health care staff about your symptoms: fever, cough and shortness of breath.  After you have been seen by a medical provider, you will be either: o Tested for (COVID-19) and discharged home on quarantine except to seek medical care if symptoms worsen, and asked to   - Stay home and avoid contact with others until you get your results (4-5 days)  - Avoid travel on public transportation if possible (such as bus, train, or airplane) or o Sent to the Emergency Department by EMS for evaluation, COVID-19 testing, and possible admission depending on your condition and test results.  What to do if you are LOW RISK for COVID-19?  Reduce your risk of any infection by using the same precautions used for avoiding the common cold or flu:   Wash your hands often with soap and warm water for at least 20 seconds.  If soap and water are not readily available, use an alcohol-based hand sanitizer with at least 60% alcohol.   If coughing or sneezing, cover your mouth and nose by coughing or sneezing into the elbow areas of your shirt or coat, into a tissue or into your sleeve (not your hands).  Avoid shaking hands with others and consider head nods or verbal greetings only.  Avoid touching your eyes, nose, or mouth with unwashed hands.   Avoid close contact with people who are sick.  Avoid places or events with large numbers of people in one location, like concerts or sporting events.  Carefully consider travel plans you have or are making.  If you are planning any travel outside or inside the [place], visit the [place] webpage for the latest health notices.  If you have some symptoms but not all symptoms, continue to monitor at home and seek medical attention if your symptoms worsen.  If you are having a medical emergency, call 911.   [place] / e-Visit: [email or web address]         [facility]: [facility]: [id number]                   [facility]: [phone]

## 2019-08-24 NOTE — Progress Notes (Signed)
Provided patient with complementary case of Ensure Enlive.

## 2019-08-25 ENCOUNTER — Telehealth: Payer: Self-pay | Admitting: Nurse Practitioner

## 2019-08-25 MED FILL — DIPHENOXYLATE-ATROPINE 2.5-: 2.5-0.025 | 12 days supply | Qty: 90 | Fill #0

## 2019-08-25 MED FILL — oxyCODONE HCL 10 MG TABS: 10 | 20 days supply | Qty: 120 | Fill #0

## 2019-08-25 NOTE — Telephone Encounter (Signed)
No los per 8/27. °

## 2019-08-26 MED FILL — OSCIMIN SL 0.125 MG TABLET: 0.125 | 7 days supply | Qty: 30 | Fill #1

## 2019-08-30 ENCOUNTER — Telehealth: Payer: Self-pay

## 2019-08-30 ENCOUNTER — Other Ambulatory Visit: Payer: Self-pay | Admitting: Nurse Practitioner

## 2019-08-30 DIAGNOSIS — C186 Malignant neoplasm of descending colon: Secondary | ICD-10-CM

## 2019-08-30 DIAGNOSIS — C189 Malignant neoplasm of colon, unspecified: Secondary | ICD-10-CM

## 2019-08-30 DIAGNOSIS — E876 Hypokalemia: Secondary | ICD-10-CM

## 2019-08-30 NOTE — Telephone Encounter (Signed)
Left vm msg in regards to pt taking Potassium. Per [name] pt should take Potassium Chloride 20 mEq daily ( 1 tablet daily) Please advise daughter on dose.

## 2019-09-01 ENCOUNTER — Other Ambulatory Visit: Payer: Self-pay

## 2019-09-01 DIAGNOSIS — C189 Malignant neoplasm of colon, unspecified: Secondary | ICD-10-CM

## 2019-09-01 DIAGNOSIS — E876 Hypokalemia: Secondary | ICD-10-CM

## 2019-09-01 DIAGNOSIS — C186 Malignant neoplasm of descending colon: Secondary | ICD-10-CM

## 2019-09-01 MED ORDER — POTASSIUM CHLORIDE CRYS ER 20 MEQ PO TBCR: 20.0000 meq | EXTENDED_RELEASE_TABLET | Freq: Two times a day (BID) | ORAL | 2 refills | Status: DC

## 2019-09-01 MED FILL — POTASSIUM CHLORIDE CRYS ER: 20 | 30 days supply | Qty: 60 | Fill #0

## 2019-09-01 NOTE — Progress Notes (Signed)
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 q2weeks on 04/14/19.Due to poor tolerance Irinotecan was held. Restartedlow dose irinotecan on 06/02/19, and stopped after dose on 06/02/2019 due to poor tolerance.He proceeded with maintenance Vectibix.   -Unfortunately his CT CAP from 07/24/19 showsincrease of mass in the splenic hilum, and enhancement of left liver lobe. There is stable retroperitoneal adenopathy.  -To better control his disease with his limited treatment options I recommended phase 1 clinical trialdata([place], JCO 37(15), 2019)which shows good response with Regorafinib and Nivolumab in MSS metastatic colorectal cancer (objective response rate 29%). -He started first cycle on 08/11/19. He tolerated poorly with severe fatigue, diarrhea, and anorexia. Nivo and Regorafinib held from day 13 (08/23/19).  -Since off treatment he feels less drowsy but overall stable. Pain mostly controlled, no GI issues, eating and drinking adequately and weight still fluctuates. He is hypotensive today but not symptomatic. -Labs reviewed, CBC and CMP WNL except Hg 7.9, PLT 146K,  Sodium 133, BG 104, Ca 7.7, Albumin 1.7, AST 10. Mag 1.4. CEA and Ferritin still pending. Physical exam was normal today. He  denies any bleeding recently.  -I discussed with him and his daughter that he tolerates treatment poorly and is greatly impacting his quality of life. I recommend stopping treatment and proceeding with palliative care to manage his symptoms with supportive care. I also discussed hospice and encourage him to think about it but he declined. He will think about palliative care at home.   -Will given IV Fluids and IV Mag today and Blood transfusion tomorrow.  -Will f/u with him in 2 weeks for more IV supportive care.    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 -He previously underwent angio Vac/thrombectomyfor his extensive LLL DVT.He previously had a recurrent GI bleeding from his tumor when he was on full dose anticoagulation.  -Hepreviouslyhadbleeding at ostomy site on 04/07/19. 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 Currently on Ferrous Sulfate. Continue. -Labs reviewed,hemoglobin7.9today (09/08/19). Ferritin still pending. Will give blood transfusion tomorrow. He is agreeable.   5. 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 He has been coughing lately. -He takesMS Contin 343mBID and Oxycodone 1037m6hoursas needed.Pain remains controlled and stable.  -Healsotakes Gabapentin for his left leg painand Levsin for abdominal muscle spasms. -He went to ED in 07/2019 for his bowel prolapse from stoma. I strongly encouraged him to avoid constipation as this can cause him to have hernia when straining. I encouraged  him to use laxative as needed.  -I contact Dr. WilRedmond Pullinge recommends him to put sugar at ostomy site to reduce prolapse   6. 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 -Currently on MirtazapineandProtonix. -He has been able to gain 10 poundsrecently -He has been taking 3-4 ensure daily, I encouraged him to continue -His eating  And drinking improved and has was able to gain weight, but continues to fluctuate lately.   7. Hypomagnesemia -Secondary to Vectibix,it is getting worse -He will continue oral magnesium twice daily -Vectibix has been d/c -Mag at 1.4today (09/08/19). Will give IV mag today.   7. Goal of care discussion, DNR/DNI -Weagaindiscussed the incurable nature of his cancer, and the overall poor prognosis, especiallyhis overall condition hasdeterioratedlately -he agreed with DNR/DNI -he was on hospice care for 2-3 weeks in March 2020,he has withdrawn from hospicedue to his medication issue. -Given we have d/c treatment again due to poor toleration he is eligible for hospice. He declined for now.   8. Cough with phlegm  -He recently developed a cough with light colored phlegm, no fever  -He has been getting mildly SOB with exertion -I advised him to hold stoma when coughing as this can lead to hernia protrusion.   9. Hypotension  -His BP at 85/67 today (09/08/19).  -He denies being symptomatic, he drinks 5 bottles of water a day and not on HTN medications.  -This is likely related to his cancer.  -I offered IV fluids today, he is agreeable.  -I suggest he add salt to his diet to help increase BP.  -will check 8am cortisone and ACTH level next week, to rule out Nivo induced adrenal insufficiency    Plan -D/c chemo and immunotherapy -IV Fluids with Mag today  -2 unit Blood transfusion tomorrow  -Lab, f/u, IVFs in 1 weeks  No problem-specific Assessment & Plan notes found for this encounter.   No  orders of the defined types were placed in this encounter.  All questions were answered. The patient knows to call the clinic with any problems, questions or concerns. No barriers to learning was detected. I spent 20 minutes counseling the patient face to face. The total time spent in the appointment was 25 minutes and more than 50% was on counseling and review of test results     [name], MD 09/08/2019   I, Joslyn Devon, am acting as scribe for [name], MD.   I have reviewed the above documentation for accuracy and completeness, and I agree with the above.

## 2019-09-05 MED FILL — MORPHINE SULF 60 MG TAB SA: 60 | 30 days supply | Qty: 60 | Fill #0

## 2019-09-08 ENCOUNTER — Inpatient Hospital Stay (HOSPITAL_BASED_OUTPATIENT_CLINIC_OR_DEPARTMENT_OTHER): Payer: Medicaid Other | Admitting: Hematology

## 2019-09-08 ENCOUNTER — Inpatient Hospital Stay: Payer: Medicaid Other | Attending: Hematology

## 2019-09-08 ENCOUNTER — Other Ambulatory Visit: Payer: Self-pay

## 2019-09-08 ENCOUNTER — Inpatient Hospital Stay: Payer: Medicaid Other

## 2019-09-08 ENCOUNTER — Telehealth: Payer: Self-pay | Admitting: Hematology

## 2019-09-08 VITALS — BP 85/67 | HR 94 | Temp 99.2°F | Resp 18 | Ht 69.0 in | Wt 105.4 lb

## 2019-09-08 DIAGNOSIS — Z79899 Other long term (current) drug therapy: Secondary | ICD-10-CM | POA: Insufficient documentation

## 2019-09-08 DIAGNOSIS — Z86718 Personal history of other venous thrombosis and embolism: Secondary | ICD-10-CM | POA: Insufficient documentation

## 2019-09-08 DIAGNOSIS — C186 Malignant neoplasm of descending colon: Secondary | ICD-10-CM | POA: Diagnosis not present

## 2019-09-08 DIAGNOSIS — I959 Hypotension, unspecified: Secondary | ICD-10-CM | POA: Insufficient documentation

## 2019-09-08 DIAGNOSIS — Z9221 Personal history of antineoplastic chemotherapy: Secondary | ICD-10-CM | POA: Insufficient documentation

## 2019-09-08 DIAGNOSIS — R05 Cough: Secondary | ICD-10-CM | POA: Insufficient documentation

## 2019-09-08 DIAGNOSIS — C189 Malignant neoplasm of colon, unspecified: Secondary | ICD-10-CM

## 2019-09-08 DIAGNOSIS — D500 Iron deficiency anemia secondary to blood loss (chronic): Secondary | ICD-10-CM

## 2019-09-08 DIAGNOSIS — D638 Anemia in other chronic diseases classified elsewhere: Secondary | ICD-10-CM | POA: Diagnosis not present

## 2019-09-08 DIAGNOSIS — D630 Anemia in neoplastic disease: Secondary | ICD-10-CM

## 2019-09-08 DIAGNOSIS — Z7901 Long term (current) use of anticoagulants: Secondary | ICD-10-CM | POA: Insufficient documentation

## 2019-09-08 DIAGNOSIS — G893 Neoplasm related pain (acute) (chronic): Secondary | ICD-10-CM | POA: Insufficient documentation

## 2019-09-08 DIAGNOSIS — Z95828 Presence of other vascular implants and grafts: Secondary | ICD-10-CM

## 2019-09-08 DIAGNOSIS — D509 Iron deficiency anemia, unspecified: Secondary | ICD-10-CM

## 2019-09-08 DIAGNOSIS — C762 Malignant neoplasm of abdomen: Secondary | ICD-10-CM

## 2019-09-08 DIAGNOSIS — Z933 Colostomy status: Secondary | ICD-10-CM | POA: Diagnosis not present

## 2019-09-08 DIAGNOSIS — Z86711 Personal history of pulmonary embolism: Secondary | ICD-10-CM | POA: Diagnosis not present

## 2019-09-08 LAB — CBC WITH DIFFERENTIAL (CANCER CENTER ONLY)
Abs Immature Granulocytes: 0.02 10*3/uL (ref 0.00–0.07)
Basophils Absolute: 0.1 10*3/uL (ref 0.0–0.1)
Basophils Relative: 1 %
Eosinophils Absolute: 0.1 10*3/uL (ref 0.0–0.5)
Eosinophils Relative: 2 %
HCT: 25.2 % — ABNORMAL LOW (ref 39.0–52.0)
Hemoglobin: 7.9 g/dL — ABNORMAL LOW (ref 13.0–17.0)
Immature Granulocytes: 0 %
Lymphocytes Relative: 22 %
Lymphs Abs: 1.5 10*3/uL (ref 0.7–4.0)
MCH: 26.6 pg (ref 26.0–34.0)
MCHC: 31.3 g/dL (ref 30.0–36.0)
MCV: 84.8 fL (ref 80.0–100.0)
Monocytes Absolute: 0.5 10*3/uL (ref 0.1–1.0)
Monocytes Relative: 8 %
Neutro Abs: 4.7 10*3/uL (ref 1.7–7.7)
Neutrophils Relative %: 67 %
Platelet Count: 146 10*3/uL — ABNORMAL LOW (ref 150–400)
RBC: 2.97 MIL/uL — ABNORMAL LOW (ref 4.22–5.81)
RDW: 17 % — ABNORMAL HIGH (ref 11.5–15.5)
WBC Count: 6.9 10*3/uL (ref 4.0–10.5)
nRBC: 0 % (ref 0.0–0.2)

## 2019-09-08 LAB — CMP (CANCER CENTER ONLY)
ALT: <6 U/L (ref 0–44)
AST: 10 U/L — ABNORMAL LOW (ref 15–41)
Albumin: 1.7 g/dL — ABNORMAL LOW (ref 3.5–5.0)
Alkaline Phosphatase: 118 U/L (ref 38–126)
Anion gap: 4 — ABNORMAL LOW (ref 5–15)
BUN: 8 mg/dL (ref 6–20)
CO2: 27 mmol/L (ref 22–32)
Calcium: 7.7 mg/dL — ABNORMAL LOW (ref 8.9–10.3)
Chloride: 102 mmol/L (ref 98–111)
Creatinine: 0.7 mg/dL (ref 0.61–1.24)
GFR, Est AFR Am: >60 mL/min (ref >60)
GFR, Estimated: >60 mL/min (ref >60)
Glucose, Bld: 104 mg/dL — ABNORMAL HIGH (ref 70–99)
Potassium: 3.9 mmol/L (ref 3.5–5.1)
Sodium: 133 mmol/L — ABNORMAL LOW (ref 135–145)
Total Bilirubin: <0.2 mg/dL — ABNORMAL LOW (ref 0.3–1.2)
Total Protein: 7.3 g/dL (ref 6.5–8.1)

## 2019-09-08 LAB — MAGNESIUM: Magnesium: 1.4 mg/dL — CL (ref 1.7–2.4)

## 2019-09-08 MED ORDER — SODIUM CHLORIDE 0.9% FLUSH
10.0000 mL | INTRAVENOUS | Status: DC | PRN
  Administered 2019-09-08: 10 mL via INTRAVENOUS
  Filled 2019-09-08: qty 10

## 2019-09-08 MED ORDER — SODIUM CHLORIDE 0.9 % IV SOLN
4.0000 g | Freq: Once | INTRAVENOUS | Status: AC
  Administered 2019-09-08: 4 g via INTRAVENOUS
  Filled 2019-09-08: qty 8

## 2019-09-08 MED ORDER — SODIUM CHLORIDE 0.9 % IV SOLN
Freq: Once | INTRAVENOUS | Status: AC
  Administered 2019-09-08: 15:00:00 via INTRAVENOUS
  Filled 2019-09-08: qty 250

## 2019-09-08 MED ORDER — HEPARIN SOD (PORK) LOCK FLUSH 100 UNIT/ML IV SOLN
500.0000 [IU] | INTRAVENOUS | Status: DC | PRN
  Administered 2019-09-08: 500 [IU] via INTRAVENOUS
  Filled 2019-09-08: qty 5

## 2019-09-08 NOTE — Progress Notes (Signed)
Per Manuela Schwartz in Blood Bank Dr. Saralyn Pilar approved for this patient to get only one unit of blood due to the shortage.

## 2019-09-08 NOTE — Telephone Encounter (Signed)
Added PRBC's for tomorrow. Spoke with infusion - infusion will confirm with patient.

## 2019-09-08 NOTE — Patient Instructions (Signed)

## 2019-09-08 NOTE — Patient Instructions (Signed)

## 2019-09-09 ENCOUNTER — Inpatient Hospital Stay: Payer: Medicaid Other

## 2019-09-09 ENCOUNTER — Encounter: Payer: Self-pay | Admitting: Hematology

## 2019-09-09 ENCOUNTER — Other Ambulatory Visit: Payer: Self-pay

## 2019-09-09 DIAGNOSIS — C186 Malignant neoplasm of descending colon: Secondary | ICD-10-CM | POA: Diagnosis not present

## 2019-09-09 LAB — ABO/RH: ABO/RH(D): O POS

## 2019-09-09 LAB — PREPARE RBC (CROSSMATCH)

## 2019-09-09 MED ORDER — SODIUM CHLORIDE 0.9% FLUSH
10.0000 mL | INTRAVENOUS | Status: AC | PRN
  Administered 2019-09-09: 12:00:00 10 mL
  Filled 2019-09-09: qty 10

## 2019-09-09 MED ORDER — HEPARIN SOD (PORK) LOCK FLUSH 100 UNIT/ML IV SOLN
500.0000 [IU] | Freq: Every day | INTRAVENOUS | Status: AC | PRN
  Administered 2019-09-09: 12:00:00 500 [IU]
  Filled 2019-09-09: qty 5

## 2019-09-09 MED ORDER — DIPHENHYDRAMINE HCL 25 MG PO CAPS
25.0000 mg | ORAL_CAPSULE | Freq: Once | ORAL | Status: AC
  Administered 2019-09-09: 09:00:00 25 mg via ORAL

## 2019-09-09 MED ORDER — DIPHENHYDRAMINE HCL 25 MG PO CAPS
ORAL_CAPSULE | ORAL | Status: AC
  Filled 2019-09-09: qty 1

## 2019-09-09 MED ORDER — SODIUM CHLORIDE 0.9% IV SOLUTION
250.0000 mL | Freq: Once | INTRAVENOUS | Status: AC
  Administered 2019-09-09: 09:00:00 250 mL via INTRAVENOUS
  Filled 2019-09-09: qty 250

## 2019-09-09 MED ORDER — ACETAMINOPHEN 325 MG PO TABS
ORAL_TABLET | ORAL | Status: AC
  Filled 2019-09-09: qty 2

## 2019-09-09 MED ORDER — ACETAMINOPHEN 325 MG PO TABS
650.0000 mg | ORAL_TABLET | Freq: Once | ORAL | Status: AC
  Administered 2019-09-09: 650 mg via ORAL

## 2019-09-09 NOTE — Patient Instructions (Addendum)
[name] will schedule a lab appointment for either this coming Monday 09/11/19 or Tuesday 09/12/19 to find out why your blood pressure has been low. The test will check your cortisol level so it will need to be drawn first thing in the morning--your lab appointment will likely be at 8am on Monday or Tuesday. There is no need to fast before the test so you can have your usual breakfast. [name] will schedule a follow up appointment the following week to discuss the results of this test and see how you're feeling.  Blood Transfusion, Adult, Care After This sheet gives you information about how to care for yourself after your procedure. Your doctor may also give you more specific instructions. If you have problems or questions, contact your doctor. Follow these instructions at home:   Take over-the-counter and prescription medicines only as told by your doctor.  Go back to your normal activities as told by your doctor.  Follow instructions from your doctor about how to take care of the area where an IV tube was put into your vein (insertion site). Make sure you: ? Wash your hands with soap and water before you change your bandage (dressing). If there is no soap and water, use hand sanitizer. ? Change your bandage as told by your doctor.  Check your IV insertion site every day for signs of infection. Check for: ? More redness, swelling, or pain. ? More fluid or blood. ? Warmth. ? Pus or a bad smell. Contact a doctor if:  You have more redness, swelling, or pain around the IV insertion site.  You have more fluid or blood coming from the IV insertion site.  Your IV insertion site feels warm to the touch.  You have pus or a bad smell coming from the IV insertion site.  Your pee (urine) turns pink, red, or brown.  You feel weak after doing your normal activities. Get help right away if:  You have signs of a serious allergic or body defense (immune) system reaction, including: ? Itchiness.  ? Hives. ? Trouble breathing. ? Anxiety. ? Pain in your chest or lower back. ? Fever, flushing, and chills. ? Fast pulse. ? Rash. ? Watery poop (diarrhea). ? Throwing up (vomiting). ? Dark pee. ? Serious headache. ? Dizziness. ? Stiff neck. ? Yellow color in your face or the white parts of your eyes (jaundice). Summary  After a blood transfusion, return to your normal activities as told by your doctor.  Every day, check for signs of infection where the IV tube was put into your vein.  Some signs of infection are warm skin, more redness and pain, more fluid or blood, and pus or a bad smell where the needle went in.  Contact your doctor if you feel weak or have any unusual symptoms. This information is not intended to replace advice given to you by your health care provider. Make sure you discuss any questions you have with your health care provider. Document Released: 01/04/2015 Document Revised: 04/20/2018 Document Reviewed: 08/07/2016 Elsevier Patient Education  2020 [place] (COVID-19) Are you at risk?  Are you at risk for the Coronavirus (COVID-19)?  To be considered HIGH RISK for Coronavirus (COVID-19), you have to meet the following criteria:  . Traveled to [place], [place], [place], [place] or [place]; or in the [place] to [place], [place], [place], or [place]; and have fever, cough, and shortness of breath within the last 2 weeks of travel  OR . Been in close contact with a person diagnosed with COVID-19 within the last 2 weeks and have fever, cough, and shortness of breath . IF YOU DO NOT MEET THESE CRITERIA, YOU ARE CONSIDERED LOW RISK FOR COVID-19.  What to do if you are HIGH RISK for COVID-19?  Marland Kitchen If you are having a medical emergency, call 911. . Seek medical care right away. Before you go to a doctor's office, urgent care or emergency department, call ahead and tell them about your recent travel, contact with someone diagnosed with  COVID-19, and your symptoms. You should receive instructions from your physician's office regarding next steps of care.  . When you arrive at healthcare provider, tell the healthcare staff immediately you have returned from visiting [place], [place], [place], [place] or [place]; or traveled in the [place] to [place], [place], [place], or [place]; in the last two weeks or you have been in close contact with a person diagnosed with COVID-19 in the last 2 weeks.   . Tell the health care staff about your symptoms: fever, cough and shortness of breath. . After you have been seen by a medical provider, you will be either: o Tested for (COVID-19) and discharged home on quarantine except to seek medical care if symptoms worsen, and asked to  - Stay home and avoid contact with others until you get your results (4-5 days)  - Avoid travel on public transportation if possible (such as bus, train, or airplane) or o Sent to the Emergency Department by EMS for evaluation, COVID-19 testing, and possible admission depending on your condition and test results.  What to do if you are LOW RISK for COVID-19?  Reduce your risk of any infection by using the same precautions used for avoiding the common cold or flu:  Marland Kitchen Wash your hands often with soap and warm water for at least 20 seconds.  If soap and water are not readily available, use an alcohol-based hand sanitizer with at least 60% alcohol.  . If coughing or sneezing, cover your mouth and nose by coughing or sneezing into the elbow areas of your shirt or coat, into a tissue or into your sleeve (not your hands). . Avoid shaking hands with others and consider head nods or verbal greetings only. . Avoid touching your eyes, nose, or mouth with unwashed hands.  . Avoid close contact with people who are sick. . Avoid places or events with large numbers of people in one location, like concerts or sporting events. . Carefully consider travel plans you have or  are making. . If you are planning any travel outside or inside the [place], visit the CDC's Travelers' Health webpage for the latest health notices. . If you have some symptoms but not all symptoms, continue to monitor at home and seek medical attention if your symptoms worsen. . If you are having a medical emergency, call 911.   [place] / e-Visit: [email or web address]         [facility]: [facility]: [id number]                   [facility]: [phone]

## 2019-09-10 LAB — BPAM RBC
Blood Product Expiration Date: 202010142359
ISSUE DATE / TIME: 202009120910
Unit Type and Rh: 5100

## 2019-09-10 LAB — TYPE AND SCREEN
ABO/RH(D): O POS
Antibody Screen: NEGATIVE
DAT, IgG: NEGATIVE
Unit division: 0

## 2019-09-11 ENCOUNTER — Telehealth: Payer: Self-pay | Admitting: Hematology

## 2019-09-11 LAB — CEA (IN HOUSE-CHCC): CEA (CHCC-In House): 17.68 ng/mL — ABNORMAL HIGH (ref 0.00–5.00)

## 2019-09-11 LAB — FERRITIN: Ferritin: 293 ng/mL (ref 24–336)

## 2019-09-11 NOTE — Telephone Encounter (Signed)
Scheduled appt per 9/12 sch message - pt aware of appt date and time

## 2019-09-12 ENCOUNTER — Inpatient Hospital Stay: Payer: Medicaid Other

## 2019-09-12 ENCOUNTER — Other Ambulatory Visit: Payer: Self-pay

## 2019-09-12 VITALS — BP 110/79 | HR 86 | Resp 16

## 2019-09-12 DIAGNOSIS — D630 Anemia in neoplastic disease: Secondary | ICD-10-CM

## 2019-09-12 DIAGNOSIS — C186 Malignant neoplasm of descending colon: Secondary | ICD-10-CM | POA: Diagnosis not present

## 2019-09-12 DIAGNOSIS — D509 Iron deficiency anemia, unspecified: Secondary | ICD-10-CM

## 2019-09-12 DIAGNOSIS — I959 Hypotension, unspecified: Secondary | ICD-10-CM

## 2019-09-12 DIAGNOSIS — C189 Malignant neoplasm of colon, unspecified: Secondary | ICD-10-CM

## 2019-09-12 LAB — CMP (CANCER CENTER ONLY)
ALT: <6 U/L (ref 0–44)
AST: 12 U/L — ABNORMAL LOW (ref 15–41)
Albumin: 1.7 g/dL — ABNORMAL LOW (ref 3.5–5.0)
Alkaline Phosphatase: 129 U/L — ABNORMAL HIGH (ref 38–126)
Anion gap: 7 (ref 5–15)
BUN: 8 mg/dL (ref 6–20)
CO2: 24 mmol/L (ref 22–32)
Calcium: 8 mg/dL — ABNORMAL LOW (ref 8.9–10.3)
Chloride: 104 mmol/L (ref 98–111)
Creatinine: 0.7 mg/dL (ref 0.61–1.24)
GFR, Est AFR Am: >60 mL/min (ref >60)
GFR, Estimated: >60 mL/min (ref >60)
Glucose, Bld: 121 mg/dL — ABNORMAL HIGH (ref 70–99)
Potassium: 4 mmol/L (ref 3.5–5.1)
Sodium: 135 mmol/L (ref 135–145)
Total Bilirubin: 0.3 mg/dL (ref 0.3–1.2)
Total Protein: 7.9 g/dL (ref 6.5–8.1)

## 2019-09-12 LAB — CBC WITH DIFFERENTIAL (CANCER CENTER ONLY)
Abs Immature Granulocytes: 0.02 10*3/uL (ref 0.00–0.07)
Basophils Absolute: 0.1 10*3/uL (ref 0.0–0.1)
Basophils Relative: 1 %
Eosinophils Absolute: 0.2 10*3/uL (ref 0.0–0.5)
Eosinophils Relative: 3 %
HCT: 33.6 % — ABNORMAL LOW (ref 39.0–52.0)
Hemoglobin: 10.5 g/dL — ABNORMAL LOW (ref 13.0–17.0)
Immature Granulocytes: 0 %
Lymphocytes Relative: 24 %
Lymphs Abs: 1.8 10*3/uL (ref 0.7–4.0)
MCH: 26.7 pg (ref 26.0–34.0)
MCHC: 31.3 g/dL (ref 30.0–36.0)
MCV: 85.5 fL (ref 80.0–100.0)
Monocytes Absolute: 0.6 10*3/uL (ref 0.1–1.0)
Monocytes Relative: 8 %
Neutro Abs: 4.7 10*3/uL (ref 1.7–7.7)
Neutrophils Relative %: 64 %
Platelet Count: 191 10*3/uL (ref 150–400)
RBC: 3.93 MIL/uL — ABNORMAL LOW (ref 4.22–5.81)
RDW: 16.4 % — ABNORMAL HIGH (ref 11.5–15.5)
WBC Count: 7.5 10*3/uL (ref 4.0–10.5)
nRBC: 0 % (ref 0.0–0.2)

## 2019-09-12 LAB — CORTISOL: Cortisol, Plasma: 15.8 ug/dL

## 2019-09-12 MED ORDER — HEPARIN SOD (PORK) LOCK FLUSH 100 UNIT/ML IV SOLN
500.0000 [IU] | Freq: Once | INTRAVENOUS | Status: AC
  Administered 2019-09-12: 500 [IU] via INTRAVENOUS
  Filled 2019-09-12: qty 5

## 2019-09-12 NOTE — Addendum Note (Signed)
Addended by: Jonnie Finner on: 09/12/2019 10:19 AM   Modules accepted: Orders, SmartSet

## 2019-09-13 ENCOUNTER — Other Ambulatory Visit: Payer: Self-pay | Admitting: Hematology

## 2019-09-13 DIAGNOSIS — C186 Malignant neoplasm of descending colon: Secondary | ICD-10-CM

## 2019-09-13 DIAGNOSIS — C189 Malignant neoplasm of colon, unspecified: Secondary | ICD-10-CM

## 2019-09-13 DIAGNOSIS — E876 Hypokalemia: Secondary | ICD-10-CM

## 2019-09-13 LAB — ACTH: C206 ACTH: 64.3 pg/mL — ABNORMAL HIGH (ref 7.2–63.3)

## 2019-09-13 MED FILL — PANTOPRAZOLE SOD DR 40 MG T: 40 | 30 days supply | Qty: 30 | Fill #0

## 2019-09-14 MED FILL — MORPHINE SULF 60 MG TAB SA: 60 | 30 days supply | Qty: 60 | Fill #0

## 2019-09-20 NOTE — Progress Notes (Signed)
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 treatment options I recommendedphase 1 clinical trialdata([place], JCO 37(15), 2019)which shows good response with Regorafinib and Nivolumab in MSS metastatic colorectal cancer (objective response rate 29%). -He started first cycle on 08/11/19. He tolerated poorly with severe fatigue, diarrhea, and anorexia. Nivo and Regorafinib held from day 13 (08/23/19) and discontinued to proceed with palliative care given poor performance status.  -This past week he has been more fatigued but eating much better and weight continues to trend up.  Pain mostly controlled, he can increase oxycodone up to q4hours. BP has mildly improved but still on lower end, I encouraged him to drink more.  -Labs reviewed today, CBC and CMP WNL except Hg 9.5, ANC 7.8, calcium 8.3. He declined IV Fluids today.  -Will continue supportive care, F/u in 2-3 weeks  -I recommended he get flu shot, he declined.   2.History ofPE,rightLE DVT, and extensive LLE DVT, history of GI  bleeding from A/C. -He previously underwent angio Vac/thrombectomyfor his extensive LLL DVT.He previously had a recurrent GI bleeding from his tumor when he  was on full dose anticoagulation.  -Hepreviouslyhadbleeding at ostomy site on 04/07/19. 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 Currently on Ferrous Sulfate. Continue. -Last blood transfusion given 09/13/19 -Labs reviewed,hemoglobin9.5today (09/22/19).  5. 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 He has been coughing lately. -He takesMS Contin 335mBID and Oxycodone 1038m6hoursas needed.Pain is not controlled on this currently. I discussed he can increase oxycodone up to q4 hours (09/22/19) -Healsotakes Gabapentin for his left leg painand Levsin for abdominal muscle spasms. -He went to ED in 07/2019 for his bowel prolapse fromstoma. I strongly encouraged him to avoid constipation as this can cause him to have hernia when straining. I encouraged him to use laxative as needed.  -I contacted Dr. WilRedmond Pullinge recommends him to put sugar at ostomy site to reduce prolapse  6. 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 -Currently on MirtazapineandProtonix. -He has been able to gain 10 poundsrecently -He has been taking 3-4 ensure daily, I encouraged him to continue -His eating improved and has was able to gain weight. Weight currently trending up.   7. Hypotension  -He denies being symptomatic, he drinks 5 bottles of water a day and not on HTN medications.  -This is likely related to his cancer and low oral intake and pain meds.  -I suggest he add  salt to his diet to help increase BP.  -morning cortisone and ACTH level was normal, no evidence of adrenal insufficiency -His BP at 94/70 today (09/22/19), improved. I encouraged him to continue to eat better and drink more.   8. Goal of care discussion, DNR/DNI -Weagaindiscussed the incurable nature of his cancer, and the overall poor prognosis, especiallyhis overall condition hasdeterioratedlately -he agreed with DNR/DNI -he was on hospice care for 2-3 weeks in March 2020,he has withdrawn from hospicedue to his medication issue. -Given we have d/c treatment again due to poor toleration he is eligible for hospice. He declined for now.   Plan -He is clinically stable, will continue supportive care. No IVFs today  -I refilled Mirtazapine, Xarelto and oxycodone today  -Lab, flush, f/u and IVFs in 2-3 weeks    No problem-specific Assessment & Plan notes found for this encounter.   No orders of the defined types were placed in this encounter.  All questions were answered. The patient knows to call the clinic with any problems, questions or concerns. No barriers to learning was detected. I spent 20 minutes counseling the patient face to face. The total time spent in the appointment was 25 minutes and more than 50% was on counseling and review of test results     YanTruitt MerleD 09/22/2019   I, AmoJoslyn Devonm acting as scribe for YanTruitt MerleD.   I have reviewed the above documentation for accuracy and completeness, and I agree with the above.

## 2019-09-22 ENCOUNTER — Inpatient Hospital Stay: Payer: Medicaid Other

## 2019-09-22 ENCOUNTER — Other Ambulatory Visit: Payer: Self-pay

## 2019-09-22 ENCOUNTER — Encounter: Payer: Self-pay | Admitting: Hematology

## 2019-09-22 ENCOUNTER — Inpatient Hospital Stay (HOSPITAL_BASED_OUTPATIENT_CLINIC_OR_DEPARTMENT_OTHER): Payer: Medicaid Other | Admitting: Hematology

## 2019-09-22 ENCOUNTER — Telehealth: Payer: Self-pay | Admitting: Hematology

## 2019-09-22 VITALS — BP 94/70 | HR 94 | Temp 98.4°F | Resp 18 | Ht 69.0 in | Wt 108.9 lb

## 2019-09-22 DIAGNOSIS — C186 Malignant neoplasm of descending colon: Secondary | ICD-10-CM | POA: Diagnosis not present

## 2019-09-22 DIAGNOSIS — D509 Iron deficiency anemia, unspecified: Secondary | ICD-10-CM

## 2019-09-22 DIAGNOSIS — C189 Malignant neoplasm of colon, unspecified: Secondary | ICD-10-CM

## 2019-09-22 DIAGNOSIS — Z86718 Personal history of other venous thrombosis and embolism: Secondary | ICD-10-CM | POA: Diagnosis not present

## 2019-09-22 DIAGNOSIS — D630 Anemia in neoplastic disease: Secondary | ICD-10-CM | POA: Diagnosis not present

## 2019-09-22 DIAGNOSIS — D500 Iron deficiency anemia secondary to blood loss (chronic): Secondary | ICD-10-CM

## 2019-09-22 DIAGNOSIS — Z95828 Presence of other vascular implants and grafts: Secondary | ICD-10-CM

## 2019-09-22 LAB — CMP (CANCER CENTER ONLY)
ALT: <6 U/L (ref 0–44)
AST: 10 U/L — ABNORMAL LOW (ref 15–41)
Albumin: 1.6 g/dL — ABNORMAL LOW (ref 3.5–5.0)
Alkaline Phosphatase: 100 U/L (ref 38–126)
Anion gap: 6 (ref 5–15)
BUN: 9 mg/dL (ref 6–20)
CO2: 28 mmol/L (ref 22–32)
Calcium: 8.3 mg/dL — ABNORMAL LOW (ref 8.9–10.3)
Chloride: 98 mmol/L (ref 98–111)
Creatinine: 0.71 mg/dL (ref 0.61–1.24)
GFR, Est AFR Am: >60 mL/min (ref >60)
GFR, Estimated: >60 mL/min (ref >60)
Glucose, Bld: 116 mg/dL — ABNORMAL HIGH (ref 70–99)
Potassium: 4.4 mmol/L (ref 3.5–5.1)
Sodium: 132 mmol/L — ABNORMAL LOW (ref 135–145)
Total Bilirubin: 0.2 mg/dL — ABNORMAL LOW (ref 0.3–1.2)
Total Protein: 8 g/dL (ref 6.5–8.1)

## 2019-09-22 LAB — CBC WITH DIFFERENTIAL (CANCER CENTER ONLY)
Abs Immature Granulocytes: 0.04 10*3/uL (ref 0.00–0.07)
Basophils Absolute: 0.1 10*3/uL (ref 0.0–0.1)
Basophils Relative: 1 %
Eosinophils Absolute: 0.1 10*3/uL (ref 0.0–0.5)
Eosinophils Relative: 1 %
HCT: 30.8 % — ABNORMAL LOW (ref 39.0–52.0)
Hemoglobin: 9.5 g/dL — ABNORMAL LOW (ref 13.0–17.0)
Immature Granulocytes: 0 %
Lymphocytes Relative: 13 %
Lymphs Abs: 1.4 10*3/uL (ref 0.7–4.0)
MCH: 26.4 pg (ref 26.0–34.0)
MCHC: 30.8 g/dL (ref 30.0–36.0)
MCV: 85.6 fL (ref 80.0–100.0)
Monocytes Absolute: 1.1 10*3/uL — ABNORMAL HIGH (ref 0.1–1.0)
Monocytes Relative: 10 %
Neutro Abs: 7.8 10*3/uL — ABNORMAL HIGH (ref 1.7–7.7)
Neutrophils Relative %: 75 %
Platelet Count: 259 10*3/uL (ref 150–400)
RBC: 3.6 MIL/uL — ABNORMAL LOW (ref 4.22–5.81)
RDW: 15.8 % — ABNORMAL HIGH (ref 11.5–15.5)
WBC Count: 10.4 10*3/uL (ref 4.0–10.5)
nRBC: 0 % (ref 0.0–0.2)

## 2019-09-22 LAB — MAGNESIUM: Magnesium: 1.7 mg/dL (ref 1.7–2.4)

## 2019-09-22 MED ORDER — HEPARIN SOD (PORK) LOCK FLUSH 100 UNIT/ML IV SOLN
500.0000 [IU] | Freq: Once | INTRAVENOUS | Status: AC
  Administered 2019-09-22: 11:00:00 500 [IU] via INTRAVENOUS
  Filled 2019-09-22: qty 5

## 2019-09-22 MED ORDER — OXYCODONE HCL 10 MG PO TABS: 10.0000 mg | ORAL_TABLET | ORAL | 0 refills | Status: DC | PRN

## 2019-09-22 MED ORDER — SODIUM CHLORIDE 0.9% FLUSH
10.0000 mL | INTRAVENOUS | Status: DC | PRN
  Administered 2019-09-22: 10:00:00 10 mL via INTRAVENOUS
  Filled 2019-09-22: qty 10

## 2019-09-22 MED ORDER — MIRTAZAPINE 15 MG PO TABS: 15.0000 mg | ORAL_TABLET | Freq: Every day | ORAL | 3 refills | Status: AC

## 2019-09-22 MED ORDER — RIVAROXABAN 15 MG PO TABS: 15.0000 mg | ORAL_TABLET | Freq: Every day | ORAL | 3 refills | Status: DC

## 2019-09-22 MED FILL — oxyCODONE HCL 10 MG TABS: 10 | 20 days supply | Qty: 120 | Fill #0

## 2019-09-22 MED FILL — XARELTO 15 MG TABLET: 15 | 30 days supply | Qty: 30 | Fill #0

## 2019-09-22 MED FILL — MIRTAZAPINE 15 MG TABLET: 15 | 30 days supply | Qty: 30 | Fill #0

## 2019-09-22 NOTE — Telephone Encounter (Signed)
Scheduled appt per 9/25 los.  Spoke with pt and he is aware of the appt date and time,

## 2019-09-22 NOTE — Patient Instructions (Signed)

## 2019-09-29 MED FILL — DIPHENOXYLATE-ATROPINE 2.5-: 2.5-0.025 | 12 days supply | Qty: 90 | Fill #1

## 2019-09-29 MED FILL — GABAPENTIN 300 MG CAPSULE: 300 | 30 days supply | Qty: 90 | Fill #1

## 2019-09-29 MED FILL — POTASSIUM CHLORIDE CRYS ER: 20 | 30 days supply | Qty: 60 | Fill #1

## 2019-10-04 ENCOUNTER — Other Ambulatory Visit: Payer: Self-pay

## 2019-10-04 ENCOUNTER — Inpatient Hospital Stay (HOSPITAL_COMMUNITY)
Admission: EM | Admit: 2019-10-04 | Discharge: 2019-10-06 | DRG: 374 | Disposition: A | Discharge status: Hospice | Payer: Medicaid Other | Attending: Internal Medicine | Admitting: Internal Medicine

## 2019-10-04 DIAGNOSIS — B370 Candidal stomatitis: Secondary | ICD-10-CM | POA: Diagnosis present

## 2019-10-04 DIAGNOSIS — Z7901 Long term (current) use of anticoagulants: Secondary | ICD-10-CM

## 2019-10-04 DIAGNOSIS — Z7401 Bed confinement status: Secondary | ICD-10-CM

## 2019-10-04 DIAGNOSIS — R531 Weakness: Secondary | ICD-10-CM

## 2019-10-04 DIAGNOSIS — Z933 Colostomy status: Secondary | ICD-10-CM

## 2019-10-04 DIAGNOSIS — E43 Unspecified severe protein-calorie malnutrition: Secondary | ICD-10-CM | POA: Diagnosis present

## 2019-10-04 DIAGNOSIS — Z86718 Personal history of other venous thrombosis and embolism: Secondary | ICD-10-CM

## 2019-10-04 DIAGNOSIS — Z681 Body mass index (BMI) 19 or less, adult: Secondary | ICD-10-CM

## 2019-10-04 DIAGNOSIS — E860 Dehydration: Secondary | ICD-10-CM

## 2019-10-04 DIAGNOSIS — N179 Acute kidney failure, unspecified: Secondary | ICD-10-CM | POA: Diagnosis present

## 2019-10-04 DIAGNOSIS — E876 Hypokalemia: Secondary | ICD-10-CM | POA: Diagnosis present

## 2019-10-04 DIAGNOSIS — R64 Cachexia: Secondary | ICD-10-CM | POA: Diagnosis present

## 2019-10-04 DIAGNOSIS — Z515 Encounter for palliative care: Secondary | ICD-10-CM | POA: Diagnosis not present

## 2019-10-04 DIAGNOSIS — Z95828 Presence of other vascular implants and grafts: Secondary | ICD-10-CM

## 2019-10-04 DIAGNOSIS — Z79899 Other long term (current) drug therapy: Secondary | ICD-10-CM

## 2019-10-04 DIAGNOSIS — C189 Malignant neoplasm of colon, unspecified: Principal | ICD-10-CM | POA: Diagnosis present

## 2019-10-04 DIAGNOSIS — Z809 Family history of malignant neoplasm, unspecified: Secondary | ICD-10-CM

## 2019-10-04 DIAGNOSIS — D509 Iron deficiency anemia, unspecified: Secondary | ICD-10-CM | POA: Diagnosis present

## 2019-10-04 DIAGNOSIS — Z7289 Other problems related to lifestyle: Secondary | ICD-10-CM

## 2019-10-04 DIAGNOSIS — R627 Adult failure to thrive: Secondary | ICD-10-CM | POA: Diagnosis present

## 2019-10-04 DIAGNOSIS — Z20828 Contact with and (suspected) exposure to other viral communicable diseases: Secondary | ICD-10-CM | POA: Diagnosis present

## 2019-10-04 DIAGNOSIS — G893 Neoplasm related pain (acute) (chronic): Secondary | ICD-10-CM | POA: Diagnosis present

## 2019-10-04 DIAGNOSIS — Z66 Do not resuscitate: Secondary | ICD-10-CM | POA: Diagnosis present

## 2019-10-04 DIAGNOSIS — Z9221 Personal history of antineoplastic chemotherapy: Secondary | ICD-10-CM

## 2019-10-04 LAB — CBC WITH DIFFERENTIAL/PLATELET
Abs Immature Granulocytes: 0.07 10*3/uL (ref 0.00–0.07)
Basophils Absolute: 0.1 10*3/uL (ref 0.0–0.1)
Basophils Relative: 1 %
Eosinophils Absolute: 0.1 10*3/uL (ref 0.0–0.5)
Eosinophils Relative: 1 %
HCT: 30.7 % — ABNORMAL LOW (ref 39.0–52.0)
Hemoglobin: 9.4 g/dL — ABNORMAL LOW (ref 13.0–17.0)
Immature Granulocytes: 1 %
Lymphocytes Relative: 12 %
Lymphs Abs: 1.4 10*3/uL (ref 0.7–4.0)
MCH: 26.1 pg (ref 26.0–34.0)
MCHC: 30.6 g/dL (ref 30.0–36.0)
MCV: 85.3 fL (ref 80.0–100.0)
Monocytes Absolute: 0.9 10*3/uL (ref 0.1–1.0)
Monocytes Relative: 7 %
Neutro Abs: 9 10*3/uL — ABNORMAL HIGH (ref 1.7–7.7)
Neutrophils Relative %: 78 %
Platelets: 223 10*3/uL (ref 150–400)
RBC: 3.6 MIL/uL — ABNORMAL LOW (ref 4.22–5.81)
RDW: 15.3 % (ref 11.5–15.5)
WBC: 11.5 10*3/uL — ABNORMAL HIGH (ref 4.0–10.5)
nRBC: 0 % (ref 0.0–0.2)

## 2019-10-04 LAB — COMPREHENSIVE METABOLIC PANEL
ALT: 6 U/L (ref 0–44)
AST: 15 U/L (ref 15–41)
Albumin: 2.1 g/dL — ABNORMAL LOW (ref 3.5–5.0)
Alkaline Phosphatase: 66 U/L (ref 38–126)
Anion gap: 10 (ref 5–15)
BUN: 17 mg/dL (ref 6–20)
CO2: 24 mmol/L (ref 22–32)
Calcium: 8.7 mg/dL — ABNORMAL LOW (ref 8.9–10.3)
Chloride: 99 mmol/L (ref 98–111)
Creatinine, Ser: 1.05 mg/dL (ref 0.61–1.24)
GFR calc Af Amer: >60 mL/min (ref >60)
GFR calc non Af Amer: >60 mL/min (ref >60)
Glucose, Bld: 113 mg/dL — ABNORMAL HIGH (ref 70–99)
Potassium: 3.9 mmol/L (ref 3.5–5.1)
Sodium: 133 mmol/L — ABNORMAL LOW (ref 135–145)
Total Bilirubin: 0.5 mg/dL (ref 0.3–1.2)
Total Protein: 8.3 g/dL — ABNORMAL HIGH (ref 6.5–8.1)

## 2019-10-04 LAB — LACTIC ACID, PLASMA: Lactic Acid, Venous: 1.1 mmol/L (ref 0.5–1.9)

## 2019-10-04 NOTE — ED Triage Notes (Addendum)
Pt BIB [facility] from home. Pt has stage 4 colon cancer, treatment was stopped on 08/24/2019. EMS reports daughter called EMS due to pt having increased weakness, weight loss and fever. Pt states he has had a cough and is coughing up yellow phlegm.   EMS VS: CBG 139 RR 30 98% RA 108/78

## 2019-10-05 ENCOUNTER — Encounter (HOSPITAL_COMMUNITY): Payer: Self-pay

## 2019-10-05 ENCOUNTER — Emergency Department (HOSPITAL_COMMUNITY): Payer: Medicaid Other

## 2019-10-05 ENCOUNTER — Other Ambulatory Visit: Payer: Self-pay

## 2019-10-05 DIAGNOSIS — Z515 Encounter for palliative care: Secondary | ICD-10-CM | POA: Diagnosis not present

## 2019-10-05 DIAGNOSIS — Z7401 Bed confinement status: Secondary | ICD-10-CM | POA: Diagnosis not present

## 2019-10-05 DIAGNOSIS — E43 Unspecified severe protein-calorie malnutrition: Secondary | ICD-10-CM | POA: Diagnosis present

## 2019-10-05 DIAGNOSIS — E860 Dehydration: Secondary | ICD-10-CM | POA: Diagnosis present

## 2019-10-05 DIAGNOSIS — G893 Neoplasm related pain (acute) (chronic): Secondary | ICD-10-CM | POA: Diagnosis present

## 2019-10-05 DIAGNOSIS — Z7901 Long term (current) use of anticoagulants: Secondary | ICD-10-CM | POA: Diagnosis not present

## 2019-10-05 DIAGNOSIS — D509 Iron deficiency anemia, unspecified: Secondary | ICD-10-CM | POA: Diagnosis present

## 2019-10-05 DIAGNOSIS — Z933 Colostomy status: Secondary | ICD-10-CM | POA: Diagnosis not present

## 2019-10-05 DIAGNOSIS — Z79899 Other long term (current) drug therapy: Secondary | ICD-10-CM | POA: Diagnosis not present

## 2019-10-05 DIAGNOSIS — E876 Hypokalemia: Secondary | ICD-10-CM | POA: Diagnosis present

## 2019-10-05 DIAGNOSIS — Z809 Family history of malignant neoplasm, unspecified: Secondary | ICD-10-CM | POA: Diagnosis not present

## 2019-10-05 DIAGNOSIS — Z86718 Personal history of other venous thrombosis and embolism: Secondary | ICD-10-CM | POA: Diagnosis not present

## 2019-10-05 DIAGNOSIS — Z9221 Personal history of antineoplastic chemotherapy: Secondary | ICD-10-CM | POA: Diagnosis not present

## 2019-10-05 DIAGNOSIS — Z681 Body mass index (BMI) 19 or less, adult: Secondary | ICD-10-CM | POA: Diagnosis not present

## 2019-10-05 DIAGNOSIS — Z95828 Presence of other vascular implants and grafts: Secondary | ICD-10-CM | POA: Diagnosis not present

## 2019-10-05 DIAGNOSIS — R64 Cachexia: Secondary | ICD-10-CM | POA: Diagnosis present

## 2019-10-05 DIAGNOSIS — C189 Malignant neoplasm of colon, unspecified: Secondary | ICD-10-CM | POA: Diagnosis not present

## 2019-10-05 DIAGNOSIS — N179 Acute kidney failure, unspecified: Secondary | ICD-10-CM | POA: Diagnosis present

## 2019-10-05 DIAGNOSIS — B370 Candidal stomatitis: Secondary | ICD-10-CM | POA: Diagnosis present

## 2019-10-05 DIAGNOSIS — R627 Adult failure to thrive: Secondary | ICD-10-CM | POA: Diagnosis present

## 2019-10-05 DIAGNOSIS — Z7289 Other problems related to lifestyle: Secondary | ICD-10-CM | POA: Diagnosis not present

## 2019-10-05 DIAGNOSIS — Z66 Do not resuscitate: Secondary | ICD-10-CM | POA: Diagnosis present

## 2019-10-05 DIAGNOSIS — Z20828 Contact with and (suspected) exposure to other viral communicable diseases: Secondary | ICD-10-CM | POA: Diagnosis present

## 2019-10-05 LAB — URINALYSIS, ROUTINE W REFLEX MICROSCOPIC
Bilirubin Urine: NEGATIVE
Glucose, UA: NEGATIVE mg/dL
Hgb urine dipstick: NEGATIVE
Ketones, ur: NEGATIVE mg/dL
Leukocytes,Ua: NEGATIVE
Nitrite: NEGATIVE
Protein, ur: 30 mg/dL — AB
Specific Gravity, Urine: 1.023 (ref 1.005–1.030)
pH: 5 (ref 5.0–8.0)

## 2019-10-05 LAB — SARS CORONAVIRUS 2 BY RT PCR (HOSPITAL ORDER, PERFORMED IN ~~LOC~~ HOSPITAL LAB): SARS Coronavirus 2: NEGATIVE

## 2019-10-05 LAB — LACTIC ACID, PLASMA: Lactic Acid, Venous: 1 mmol/L (ref 0.5–1.9)

## 2019-10-05 LAB — AMMONIA: Ammonia: 23 umol/L (ref 9–35)

## 2019-10-05 MED ORDER — SODIUM CHLORIDE 0.9 % IV BOLUS
500.0000 mL | Freq: Once | INTRAVENOUS | Status: AC
  Administered 2019-10-05: 01:00:00 500 mL via INTRAVENOUS

## 2019-10-05 MED ORDER — HALOPERIDOL 0.5 MG PO TABS: 2.0000 mg | ORAL_TABLET | ORAL | Status: DC | PRN

## 2019-10-05 MED ORDER — SODIUM CHLORIDE 0.9 % IV BOLUS
500.0000 mL | Freq: Once | INTRAVENOUS | Status: AC
  Administered 2019-10-05: 500 mL via INTRAVENOUS

## 2019-10-05 MED ORDER — ACETAMINOPHEN 325 MG PO TABS
650.0000 mg | ORAL_TABLET | Freq: Four times a day (QID) | ORAL | Status: DC | PRN
  Administered 2019-10-06: 650 mg via ORAL
  Filled 2019-10-05: qty 2

## 2019-10-05 MED ORDER — HALOPERIDOL LACTATE 5 MG/ML IJ SOLN: 2.0000 mg | INTRAMUSCULAR | Status: DC | PRN

## 2019-10-05 MED ORDER — MAGIC MOUTHWASH
15.0000 mL | Freq: Four times a day (QID) | ORAL | Status: DC | PRN
  Filled 2019-10-05: qty 15

## 2019-10-05 MED ORDER — BIOTENE DRY MOUTH MT LIQD: 15.0000 mL | OROMUCOSAL | Status: DC | PRN

## 2019-10-05 MED ORDER — CHLORHEXIDINE GLUCONATE CLOTH 2 % EX PADS
6.0000 | MEDICATED_PAD | Freq: Every day | CUTANEOUS | Status: DC
  Administered 2019-10-05 – 2019-10-06 (×2): 6 via TOPICAL

## 2019-10-05 MED ORDER — ENSURE ENLIVE PO LIQD
237.0000 mL | Freq: Three times a day (TID) | ORAL | Status: DC
  Administered 2019-10-05 – 2019-10-06 (×4): 237 mL via ORAL
  Filled 2019-10-05: qty 237

## 2019-10-05 MED ORDER — OXYCODONE HCL 20 MG/ML PO CONC: 5.0000 mg | ORAL | Status: DC | PRN

## 2019-10-05 MED ORDER — MORPHINE SULFATE ER 30 MG PO TBCR
60.0000 mg | EXTENDED_RELEASE_TABLET | Freq: Two times a day (BID) | ORAL | Status: DC
  Administered 2019-10-05 – 2019-10-06 (×3): 60 mg via ORAL
  Filled 2019-10-05: qty 4
  Filled 2019-10-05 (×2): qty 2

## 2019-10-05 MED ORDER — SODIUM CHLORIDE 0.9% FLUSH: 3.0000 mL | Freq: Two times a day (BID) | INTRAVENOUS | Status: DC

## 2019-10-05 MED ORDER — LORAZEPAM 2 MG/ML IJ SOLN: 1.0000 mg | INTRAMUSCULAR | Status: DC | PRN

## 2019-10-05 MED ORDER — HYDROMORPHONE HCL 1 MG/ML IJ SOLN: 0.5000 mg | INTRAMUSCULAR | Status: DC | PRN

## 2019-10-05 MED ORDER — MIRTAZAPINE 15 MG PO TABS
15.0000 mg | ORAL_TABLET | Freq: Every day | ORAL | Status: DC
  Administered 2019-10-05: 15 mg via ORAL
  Filled 2019-10-05: qty 1

## 2019-10-05 MED ORDER — OXYCODONE HCL 10 MG PO TABS: 10.0000 mg | ORAL_TABLET | ORAL | Status: DC | PRN

## 2019-10-05 MED ORDER — ONDANSETRON 4 MG PO TBDP: 4.0000 mg | ORAL_TABLET | Freq: Four times a day (QID) | ORAL | Status: DC | PRN

## 2019-10-05 MED ORDER — LORAZEPAM 2 MG/ML PO CONC
1.0000 mg | ORAL | Status: DC | PRN
  Filled 2019-10-05: qty 0.5

## 2019-10-05 MED ORDER — ATROPINE SULFATE 1 % OP SOLN: 4.0000 [drp] | OPHTHALMIC | Status: DC | PRN

## 2019-10-05 MED ORDER — DIPHENHYDRAMINE HCL 50 MG/ML IJ SOLN: 12.5000 mg | INTRAMUSCULAR | Status: DC | PRN

## 2019-10-05 MED ORDER — HALOPERIDOL LACTATE 2 MG/ML PO CONC
2.0000 mg | ORAL | Status: DC | PRN
  Filled 2019-10-05: qty 1

## 2019-10-05 MED ORDER — GABAPENTIN 300 MG PO CAPS
300.0000 mg | ORAL_CAPSULE | Freq: Three times a day (TID) | ORAL | Status: DC
  Administered 2019-10-05 – 2019-10-06 (×4): 300 mg via ORAL
  Filled 2019-10-05 (×4): qty 1

## 2019-10-05 MED ORDER — NYSTATIN 100000 UNIT/ML MT SUSP
5.0000 mL | Freq: Four times a day (QID) | OROMUCOSAL | Status: DC
  Administered 2019-10-05 – 2019-10-06 (×4): 500000 [IU] via ORAL
  Filled 2019-10-05 (×6): qty 5

## 2019-10-05 MED ORDER — ZOLPIDEM TARTRATE 5 MG PO TABS: 5.0000 mg | ORAL_TABLET | Freq: Every evening | ORAL | Status: DC | PRN

## 2019-10-05 MED ORDER — ACETAMINOPHEN 650 MG RE SUPP: 650.0000 mg | Freq: Four times a day (QID) | RECTAL | Status: DC | PRN

## 2019-10-05 MED ORDER — LORAZEPAM 1 MG PO TABS: 1.0000 mg | ORAL_TABLET | ORAL | Status: DC | PRN

## 2019-10-05 MED ORDER — SODIUM CHLORIDE 0.9 % IV BOLUS
1000.0000 mL | Freq: Once | INTRAVENOUS | Status: AC
  Administered 2019-10-05: 07:00:00 1000 mL via INTRAVENOUS

## 2019-10-05 MED ORDER — SODIUM CHLORIDE 0.9 % IV SOLN
INTRAVENOUS | Status: DC
  Administered 2019-10-05 (×2): via INTRAVENOUS

## 2019-10-05 MED ORDER — ONDANSETRON HCL 4 MG/2ML IJ SOLN: 4.0000 mg | Freq: Four times a day (QID) | INTRAMUSCULAR | Status: DC | PRN

## 2019-10-05 MED ORDER — SENNOSIDES-DOCUSATE SODIUM 8.6-50 MG PO TABS
1.0000 | ORAL_TABLET | Freq: Every day | ORAL | Status: DC
  Administered 2019-10-05: 23:00:00 1 via ORAL
  Filled 2019-10-05 (×2): qty 1

## 2019-10-05 MED ORDER — HYOSCYAMINE SULFATE 0.125 MG SL SUBL
0.1250 mg | SUBLINGUAL_TABLET | Freq: Four times a day (QID) | SUBLINGUAL | Status: DC | PRN
  Filled 2019-10-05: qty 1

## 2019-10-05 NOTE — Progress Notes (Signed)
Received report from ED RN.
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
## 2019-10-05 NOTE — ED Notes (Signed)
ED TO INPATIENT HANDOFF REPORT  Name/Age/Gender Jeff [name] 58 y.o. male  Code Status    Code Status Orders  (From admission, onward)         Start     Ordered   10/05/19 0834  Do not attempt resuscitation (DNR)  Continuous    Question Answer Comment  In the event of cardiac or respiratory ARREST Do not call a "code blue"   In the event of cardiac or respiratory ARREST Do not perform Intubation, CPR, defibrillation or ACLS   In the event of cardiac or respiratory ARREST Use medication by any route, position, wound care, and other measures to relive pain and suffering. May use oxygen, suction and manual treatment of airway obstruction as needed for comfort.      10/05/19 0837        Code Status History    Date Active Date Inactive Code Status Order ID Comments User Context   10/05/2019 0837 10/05/2019 0837 DNR [id number]  Lavina Hamman, MD ED   10/05/2019 0755 10/05/2019 0837 DNR [id number]  Lavina Hamman, MD ED   03/02/2019 1622 03/06/2019 1805 DNR [id number]  Shelly Coss, MD Inpatient   02/15/2019 1044 02/20/2019 1853 Full Code [id number]  Bonnielee Haff, MD ED   09/13/2018 0042 09/16/2018 1648 Full Code [id number]  Toy Baker, MD Inpatient   12/31/2017 1227 01/11/2018 1446 Full Code [id number]  Theodis Blaze, MD Inpatient   08/19/2017 1755 08/21/2017 2326 Full Code [id number]  Vashti Hey, MD Inpatient   07/29/2017 1738 08/17/2017 1844 Full Code [id number]  Vianne Bulls, MD ED   07/10/2017 0041 07/14/2017 1635 Full Code [id number]  Ivor Costa, MD ED   01/11/2017 0150 01/19/2017 2008 Full Code [id number]  Etta Quill, DO ED   Advance Care Planning Activity      Home/SNF/Other Home  Chief Complaint cancer pt; sepsis  Level of Care/Admitting Diagnosis ED Disposition    ED Disposition Condition [facility]: [facility] [[id number]]  Level of Care: Med-Surg [16]  Covid Evaluation: Asymptomatic Screening Protocol (No  Symptoms)  Diagnosis: Failure to thrive in adult [358490]  Admitting Physician: Lavina Hamman [id number]  Attending Physician: Lavina Hamman [id number]  PT Class (Do Not Modify): Observation [104]  PT Acc Code (Do Not Modify): Observation [10022]       Medical History Past Medical History:  Diagnosis Date  . Acute deep vein thrombosis (DVT) of right lower extremity ([place]) 02/18/2017  . Colon cancer ([place])   . Microcytic anemia    /notes 01/11/2017    Allergies No Known Allergies  IV Location/Drains/Wounds Patient Lines/Drains/Airways Status   Active Line/Drains/Airways    Name:   Placement date:   Placement time:   Site:   Days:   Implanted Port Right Chest   -    -    Chest      Peripheral IV 10/04/19 Anterior;Proximal;Right Forearm   10/04/19    2145    Forearm   1   Colostomy RUQ   08/04/17    1312    RUQ   792          Labs/Imaging Results for orders placed or performed during the hospital encounter of 10/04/19 (from the past 48 hour(s))  Lactic acid, plasma     Status: None   Collection Time: 10/04/19 10:11 PM  Result Value Ref Range   Lactic Acid, Venous 1.1 0.5 - 1.9 mmol/L  Comment: Performed at [facility], [place] [address], [place], [place] [address]  CBC with Differential     Status: Abnormal   Collection Time: 10/04/19 10:11 PM  Result Value Ref Range   WBC 11.5 (H) 4.0 - 10.5 K/uL   RBC 3.60 (L) 4.22 - 5.81 MIL/uL   Hemoglobin 9.4 (L) 13.0 - 17.0 g/dL   HCT 30.7 (L) 39.0 - 52.0 %   MCV 85.3 80.0 - 100.0 fL   MCH 26.1 26.0 - 34.0 pg   MCHC 30.6 30.0 - 36.0 g/dL   RDW 15.3 11.5 - 15.5 %   Platelets 223 150 - 400 K/uL   nRBC 0.0 0.0 - 0.2 %   Neutrophils Relative % 78 %   Neutro Abs 9.0 (H) 1.7 - 7.7 K/uL   Lymphocytes Relative 12 %   Lymphs Abs 1.4 0.7 - 4.0 K/uL   Monocytes Relative 7 %   Monocytes Absolute 0.9 0.1 - 1.0 K/uL   Eosinophils Relative 1 %   Eosinophils Absolute 0.1 0.0 - 0.5 K/uL   Basophils Relative 1 %    Basophils Absolute 0.1 0.0 - 0.1 K/uL   Immature Granulocytes 1 %   Abs Immature Granulocytes 0.07 0.00 - 0.07 K/uL    Comment: Performed at [facility], [place] [address], [place], [place] [address]  Comprehensive metabolic panel     Status: Abnormal   Collection Time: 10/04/19 10:11 PM  Result Value Ref Range   Sodium 133 (L) 135 - 145 mmol/L   Potassium 3.9 3.5 - 5.1 mmol/L   Chloride 99 98 - 111 mmol/L   CO2 24 22 - 32 mmol/L   Glucose, Bld 113 (H) 70 - 99 mg/dL   BUN 17 6 - 20 mg/dL   Creatinine, Ser 1.05 0.61 - 1.24 mg/dL   Calcium 8.7 (L) 8.9 - 10.3 mg/dL   Total Protein 8.3 (H) 6.5 - 8.1 g/dL   Albumin 2.1 (L) 3.5 - 5.0 g/dL   AST 15 15 - 41 U/L   ALT 6 0 - 44 U/L   Alkaline Phosphatase 66 38 - 126 U/L   Total Bilirubin 0.5 0.3 - 1.2 mg/dL   GFR calc non Af Amer >60 >60 mL/min   GFR calc Af Amer >60 >60 mL/min   Anion gap 10 5 - 15    Comment: Performed at [facility], [place] [address], [place], [place] [address]  Lactic acid, plasma     Status: None   Collection Time: 10/05/19 12:40 AM  Result Value Ref Range   Lactic Acid, Venous 1.0 0.5 - 1.9 mmol/L    Comment: Performed at [facility], [place] [address] [place], [place] [address]  Ammonia     Status: None   Collection Time: 10/05/19 12:40 AM  Result Value Ref Range   Ammonia 23 9 - 35 umol/L    Comment: Performed at [facility], [place] [address] [place], [place] [address]  SARS Coronavirus 2 [facility] order, Performed in [facility] hospital lab) Nasopharyngeal Nasopharyngeal Swab     Status: None   Collection Time: 10/05/19  3:30 AM   Specimen: Nasopharyngeal Swab  Result Value Ref Range   SARS Coronavirus 2 NEGATIVE NEGATIVE    Comment: (NOTE) If result is NEGATIVE SARS-CoV-2 target nucleic acids are NOT DETECTED. The SARS-CoV-2 RNA is generally detectable in upper and lower  respiratory specimens during the acute phase of infection. The  lowest  concentration of  SARS-CoV-2 viral copies this assay can detect is 250  copies / mL. A negative result does not preclude SARS-CoV-2 infection  and should not be used as the sole basis for treatment or other  patient management decisions.  A negative result may occur with  improper specimen collection / handling, submission of specimen other  than nasopharyngeal swab, presence of viral mutation(s) within the  areas targeted by this assay, and inadequate number of viral copies  (<250 copies / mL). A negative result must be combined with clinical  observations, patient history, and epidemiological information. If result is POSITIVE SARS-CoV-2 target nucleic acids are DETECTED. The SARS-CoV-2 RNA is generally detectable in upper and lower  respiratory specimens dur ing the acute phase of infection.  Positive  results are indicative of active infection with SARS-CoV-2.  Clinical  correlation with patient history and other diagnostic information is  necessary to determine patient infection status.  Positive results do  not rule out bacterial infection or co-infection with other viruses. If result is PRESUMPTIVE POSTIVE SARS-CoV-2 nucleic acids MAY BE PRESENT.   A presumptive positive result was obtained on the submitted specimen  and confirmed on repeat testing.  While 2019 novel coronavirus  (SARS-CoV-2) nucleic acids may be present in the submitted sample  additional confirmatory testing may be necessary for epidemiological  and / or clinical management purposes  to differentiate between  SARS-CoV-2 and other Sarbecovirus currently known to infect humans.  If clinically indicated additional testing with an alternate test  methodology [phone]) is advised. The SARS-CoV-2 RNA is generally  detectable in upper and lower respiratory sp ecimens during the acute  phase of infection. The expected result is Negative. Fact Sheet for Patients:   StrictlyIdeas.no Fact Sheet for Healthcare Providers: BankingDealers.co.za This test is not yet approved or cleared by the [place] FDA and has been authorized for detection and/or diagnosis of SARS-CoV-2 by FDA under an Emergency Use Authorization (EUA).  This EUA will remain in effect (meaning this test can be used) for the duration of the COVID-19 declaration under Section 564(b)(1) of the Act, 21 U.S.C. section 360bbb-3(b)(1), unless the authorization is terminated or revoked sooner. Performed at [facility], [place] [address], [place], [place] [address]   Urinalysis, Routine w reflex microscopic     Status: Abnormal   Collection Time: 10/05/19  5:55 AM  Result Value Ref Range   Color, Urine YELLOW YELLOW   APPearance TURBID (A) CLEAR   Specific Gravity, Urine 1.023 1.005 - 1.030   pH 5.0 5.0 - 8.0   Glucose, UA NEGATIVE NEGATIVE mg/dL   Hgb urine dipstick NEGATIVE NEGATIVE   Bilirubin Urine NEGATIVE NEGATIVE   Ketones, ur NEGATIVE NEGATIVE mg/dL   Protein, ur 30 (A) NEGATIVE mg/dL   Nitrite NEGATIVE NEGATIVE   Leukocytes,Ua NEGATIVE NEGATIVE   Bacteria, UA RARE (A) NONE SEEN   Mucus PRESENT     Comment: Performed at [address], [place], [place] [address]   *Note: Due to a large number of results and/or encounters for the requested time period, some results have not been displayed. A complete set of results can be found in Results Review.   Dg Chest Port 1 View  Result Date: 10/05/2019 CLINICAL DATA:  Cough weakness and colon cancer EXAM: PORTABLE CHEST 1 VIEW COMPARISON:  06/07/2019 FINDINGS: Right-sided central venous port tip over the SVC. No acute airspace disease or effusion. Normal cardiomediastinal silhouette. No pneumothorax. IMPRESSION: No active disease. Electronically Signed  By: Donavan Foil M.D.   On: 10/05/2019 00:54    Pending Labs Unresulted Labs (From  admission, onward)    Start     Ordered   10/05/19 0003  Culture, blood (Routine X 2) w Reflex to ID Panel  BLOOD CULTURE X 2,   R (with STAT occurrences)     10/05/19 0003   10/05/19 0003  Urine Culture  Once,   STAT     10/05/19 0003          Vitals/Pain Today's Vitals   10/05/19 0845 10/05/19 0900 10/05/19 0930 10/05/19 1101  BP:  (!) 116/91 129/88 101/80  Pulse: (!) 106 (!) 103 79 (!) 103  Resp: (!) 29 (!) 27 10 (!) 24  Temp:      TempSrc:      SpO2: (!) 88% 93% 98% 100%  PainSc:        Isolation Precautions No active isolations  Medications Medications  gabapentin (NEURONTIN) capsule 300 mg (300 mg Oral Given 10/05/19 1126)  hyoscyamine (LEVSIN SL) SL tablet 0.125 mg (has no administration in time range)  feeding supplement (ENSURE ENLIVE) (ENSURE ENLIVE) liquid 237 mL (has no administration in time range)  mirtazapine (REMERON) tablet 15 mg (has no administration in time range)  morphine (MS CONTIN) 12 hr tablet 60 mg (60 mg Oral Given 10/05/19 1123)  sodium chloride flush (NS) 0.9 % injection 3 mL (has no administration in time range)  0.9 %  sodium chloride infusion ( Intravenous New Bag/Given 10/05/19 1130)  acetaminophen (TYLENOL) tablet 650 mg (has no administration in time range)    Or  acetaminophen (TYLENOL) suppository 650 mg (has no administration in time range)  zolpidem (AMBIEN) tablet 5 mg (has no administration in time range)  senna-docusate (Senokot-S) tablet 1 tablet (has no administration in time range)  magic mouthwash (has no administration in time range)  HYDROmorphone (DILAUDID) injection 0.5 mg (has no administration in time range)  LORazepam (ATIVAN) tablet 1 mg (has no administration in time range)    Or  LORazepam (ATIVAN) 2 MG/ML concentrated solution 1 mg (has no administration in time range)    Or  LORazepam (ATIVAN) injection 1 mg (has no administration in time range)  haloperidol (HALDOL) tablet 2 mg (has no administration in time range)     Or  haloperidol (HALDOL) 2 MG/ML solution 2 mg (has no administration in time range)    Or  haloperidol lactate (HALDOL) injection 2 mg (has no administration in time range)  diphenhydrAMINE (BENADRYL) injection 12.5 mg (has no administration in time range)  ondansetron (ZOFRAN-ODT) disintegrating tablet 4 mg (has no administration in time range)    Or  ondansetron (ZOFRAN) injection 4 mg (has no administration in time range)  atropine 1 % ophthalmic solution 4 drop (has no administration in time range)  LORazepam (ATIVAN) injection 1 mg (has no administration in time range)  antiseptic oral rinse (BIOTENE) solution 15 mL (has no administration in time range)  nystatin (MYCOSTATIN) 100000 UNIT/ML suspension 500,000 Units (has no administration in time range)  oxyCODONE (ROXICODONE INTENSOL) 20 MG/ML concentrated solution 5-10 mg (has no administration in time range)    Or  oxyCODONE (ROXICODONE INTENSOL) 20 MG/ML concentrated solution 5-10 mg (has no administration in time range)  sodium chloride 0.9 % bolus 500 mL (0 mLs Intravenous Stopped 10/05/19 0239)  sodium chloride 0.9 % bolus 500 mL (0 mLs Intravenous Stopped 10/05/19 0643)  sodium chloride 0.9 % bolus 1,000 mL (0 mLs Intravenous Stopped  10/05/19 1145)    Mobility non-ambulatory

## 2019-10-05 NOTE — ED Notes (Signed)
Pt colostomy bag was very full and had burst.  This RN changed the pt's appliance and bag, and changed the linens from where the bag had leaked.

## 2019-10-05 NOTE — Progress Notes (Signed)
Patient is showing in YELLOW MEWS due to pulse of 121. Patient was just admitted from ED, this is not an acute change, and doctor is aware. Patient is stable and no signs of distress present. Current vitals 98.7 F, 110/84, Pulse 127, Resp 20, 94% O2 on RA.

## 2019-10-05 NOTE — Progress Notes (Signed)
Hydrologist [facility])  Hospital Liaison: RN note   Notified by Transition of Care Manger Marney Doctor, RN of patient/family request for [facility] services at home after discharge. Chart and patient information under review by [facility] physician. Hospice eligibility pending currently.     Writer spoke with daughter, [name] to initiate education related to hospice philosophy, services and team approach to care. [name] verbalized understanding of information given. Per discussion, plan is for discharge to home by private vehicle.     Please send signed and completed DNR form home with patient/family. Patient will need prescriptions for discharge comfort medications.     DME needs have been discussed. Daughter states he does not currently need anything.     [facility] aware of the above. Please notify [facility] when patient is ready to leave the unit at discharge. (Call [phone] or [phone] after 5pm.) [facility] information and contact numbers given to  [facility].      Please call with any hospice related questions.     Thank you for this referral.     Farrel Gordon, RN, CCM  [place] (listed on AMION under [facility] and [place] [facility] [place])  [phone]
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
## 2019-10-06 DIAGNOSIS — C189 Malignant neoplasm of colon, unspecified: Principal | ICD-10-CM

## 2019-10-06 LAB — URINE CULTURE: Culture: NO GROWTH

## 2019-10-06 MED ORDER — SODIUM CHLORIDE 0.9 % IV SOLN
INTRAVENOUS | Status: DC
  Administered 2019-10-06: 08:00:00 via INTRAVENOUS

## 2019-10-06 MED ORDER — LORAZEPAM 2 MG/ML PO CONC: 1.0000 mg | ORAL | 0 refills | Status: AC | PRN

## 2019-10-06 MED ORDER — OXYCODONE HCL 20 MG/ML PO CONC: 10.0000 mg | ORAL | 0 refills | Status: AC | PRN

## 2019-10-06 MED ORDER — FENTANYL 50 MCG/HR TD PT72: 1.0000 | MEDICATED_PATCH | TRANSDERMAL | 0 refills | Status: AC

## 2019-10-06 MED FILL — OXYCODONE HCL 100 MG/5ML CO: 100 | 5 days supply | Qty: 30 | Fill #0

## 2019-10-06 MED FILL — fentaNYL 50 MCG/HR PT72: 50 | 30 days supply | Qty: 10 | Fill #0

## 2019-10-06 NOTE — Progress Notes (Signed)
Palliative care consult received  Chart reviewed and had discussed case with admitting hospitalist.  Chart review reveals that patient has elected home with hospice.  I confirmed this with hospice liaison today.  As goals clear and discharge order placed, will hold on formal consult.  Please call if we can be of assistance in the care of Jeff [name] moving forward.  Micheline Rough, MD [facility] Palliative Medicine Team [phone]  NO CHARGE NOTE

## 2019-10-06 NOTE — Discharge Summary (Signed)
Physician Discharge Summary  Jeff [name] [name] [name]: 08/07/1961 [name]: 10/04/2019  PCP: Patient, [name]  Admit date: 10/04/2019 Discharge date: 10/06/2019  Admitted From: Home Disposition:  Home with Hospice  Recommendations for Outpatient Follow-up:  1. Follow-up with home hospice services  Home Health: No Equipment/Devices: None  Discharge Condition: Hospice CODE STATUS: DNR Diet recommendation: Regular as tolerated  History of present illness:  Jeff [name] is a 58 y.o. male with Past medical history of stage IV colon cancer diagnosed in 2018 with progression on chemotherapy currently off of chemotherapy, DVT 2019, cancer-related pain, severe protein calorie malnutrition. Patient was brought in by daughter who is the primary caregiver. At the time of my evaluation patient reported pain in bilateral flank and fatigue and weakness but when I asked him what brought him to the hospital he was not sure. Further history was taken from daughter who mentions that over the past few days patient has become progressively weak and lethargic he has also noticed confusion developed in the last couple of days. Patient is not eating or drinking for last 1 week and is spending most of his time in the bed. Patient at the time of my evaluation denies any nausea or vomiting denies any chest pain denies any headache or dizziness. He also denies any shortness of breath. Daughter reports some cough which patient denied.  EDP evaluated the patient, felt that the patient was dehydrated and recommended admission for IV hydration and further goals of care discussion.  At his baseline ambulates with assistance now mostly bedbound dependent for most of his ADL; Does not manages his medication on his own.  Hospital course:  Stage IV colon cancer Adult failure to thrive Severe protein calorie malnutrition Patient brought into ED due to progressive poor oral intake for past week with  progressive weakness and lethargy.  Admitted for adult failure to thrive secondary to progressive colon cancer despite chemotherapy.  Patient with initial diagnosis of colon cancer 2018 and had been on chemotherapy with last CT scan on July 25, 2019 showing new lesions in the liver despite aggressive treatment.  At his outpatient oncologist visit with Dr. [name], she suggested that given his intolerance to chemotherapy with progressive lesions noted on repeat scan that patient and family should consider hospice at that time.  Patient was admitted and treated with IV fluid hydration.  Hospice was consulted and daughter understands that this process is going to continue to worsen and progress and that the best option will be to transition to home hospice.  Patient's home pain regimen was adjusted given to his poor oral intake.  Will discharge home on fentanyl patch 50 mcg transdermal every 72 hours.  We will also continue oxycodone oral solution 10 mg prn for dyspnea, or breakthrough pain.  Also will prescribe Ativan oral solution to use as needed for anxiety.  Hx DVT Patient was on Xarelto previously.  Given his poor oral intake coupled with his progressive metastatic colon cancer and severe protein calorie malnutrition, will discontinue as this medication has increased bleeding risk in this patient.   Discharge Diagnoses:  Principal Problem:   Primary colon cancer with metastasis to other site [facility]) Active Problems:   Protein-calorie malnutrition, severe   Anemia, iron deficiency   History of deep vein thrombosis (DVT) of lower extremity   Port catheter in place   Hypokalemia   Status post insertion of inferior vena caval filter   S/P colostomy ([place])   Failure to thrive  in adult    Discharge Instructions  Discharge Instructions    Diet - low sodium heart healthy   Complete by: As directed    Increase activity slowly   Complete by: As directed      Allergies as of 10/06/2019   No Known  Allergies     Medication List    STOP taking these medications   ferrous sulfate 325 (65 FE) MG tablet   magnesium oxide 400 (241.3 Mg) MG tablet Commonly known as: MAG-OX   morphine 60 MG 12 hr tablet Commonly known as: MS CONTIN   Oxycodone HCl 10 MG Tabs Replaced by: oxyCODONE 20 MG/ML concentrated solution   pantoprazole 40 MG tablet Commonly known as: PROTONIX   potassium chloride SA 20 MEQ tablet Commonly known as: KLOR-CON   Rivaroxaban 15 MG Tabs tablet Commonly known as: Xarelto     TAKE these medications   diphenoxylate-atropine 2.5-0.025 MG tablet Commonly known as: LOMOTIL Take 2 tablets by mouth 4 (four) times daily as needed for diarrhea or loose stools.   feeding supplement (ENSURE ENLIVE) Liqd Take 237 mLs by mouth 3 (three) times daily.   fentaNYL 50 MCG/HR Commonly known as: [place] 1 patch onto the skin every 3 (three) days.   gabapentin 300 MG capsule Commonly known as: NEURONTIN Take 1 capsule (300 mg total) by mouth 3 (three) times daily.   hyoscyamine 0.125 MG SL tablet Commonly known as: LEVSIN SL Place 1 tablet (0.125 mg total) under the tongue every 6 (six) hours as needed. What changed: reasons to take this   LORazepam 2 MG/ML concentrated solution Commonly known as: ATIVAN Place 0.5 mLs (1 mg total) under the tongue every 4 (four) hours as needed for anxiety.   mirtazapine 15 MG tablet Commonly known as: REMERON Take 1 tablet (15 mg total) by mouth at bedtime.   ondansetron 8 MG tablet Commonly known as: Zofran Take 1 tablet (8 mg total) by mouth 2 (two) times daily as needed for refractory nausea / vomiting. Start on day 3 after chemotherapy.   oxyCODONE 20 MG/ML concentrated solution Commonly known as: ROXICODONE INTENSOL Take 0.5 mLs (10 mg total) by mouth every 2 (two) hours as needed for moderate pain (or dyspnea). Replaces: Oxycodone HCl 10 MG Tabs   prochlorperazine 10 MG tablet Commonly known as:  COMPAZINE Take 1 tablet (10 mg total) by mouth every 6 (six) hours as needed (Nausea or vomiting).       No Known Allergies  Consultations:  Hospice   Procedures/Studies: Dg Chest Port 1 View  Result Date: 10/05/2019 CLINICAL DATA:  Cough weakness and colon cancer EXAM: PORTABLE CHEST 1 VIEW COMPARISON:  06/07/2019 FINDINGS: Right-sided central venous port tip over the SVC. No acute airspace disease or effusion. Normal cardiomediastinal silhouette. No pneumothorax. IMPRESSION: No active disease. Electronically Signed   By: Donavan Foil M.D.   On: 10/05/2019 00:54      Subjective: Patient seen and examined at bedside, slightly confused.  States wants to go home today.  Reports not much appetite.  Very thin and cachectic in appearance.  States pain is controlled.  No other concerns/complaints this morning.  Updated patient's daughter via telephone on plan to return home with hospice services.  She was without any further concerns at this point.  No acute events overnight per nursing staff.   Discharge Exam: Vitals:   10/05/19 2058 10/06/19 0511  BP: 102/78 107/84  Pulse: 96 (!) 105  Resp: 15 15  Temp: 98  F (36.7 C) 98.9 F (37.2 C)  SpO2: 97% 97%   Vitals:   10/05/19 1154 10/05/19 1227 10/05/19 2058 10/06/19 0511  BP: 115/84 110/84 102/78 107/84  Pulse: 96 (!) 121 96 (!) 105  Resp: 18 20 15 15   Temp:  98.7 F (37.1 C) 98 F (36.7 C) 98.9 F (37.2 C)  TempSrc:  Oral Oral Oral  SpO2: 100% 94% 97% 97%  Weight:  42 kg    Height:  5\' 9"  (1.753 m)      General: Pt is alert, pleasantly confused, no acute distress, thin and cachectic in appearance Cardiovascular: RRR, S1/S2 +, no rubs, no gallops Respiratory: CTA bilaterally, no wheezing, no rhonchi Abdominal: Soft, NT, ND, bowel sounds + Extremities: no edema, no cyanosis    The results of significant diagnostics from this hospitalization (including imaging, microbiology, ancillary and laboratory) are listed below  for reference.     Microbiology: Recent Results (from the past 240 hour(s))  Culture, blood (Routine X 2) w Reflex to ID Panel     Status: None (Preliminary result)   Collection Time: 10/05/19 12:40 AM   Specimen: BLOOD  Result Value Ref Range Status   Specimen Description   Final    BLOOD LEFT ANTECUBITAL Performed at [facility] [address], [place], [place] [address]    Special Requests   Final    BOTTLES DRAWN AEROBIC AND ANAEROBIC Blood Culture results may not be optimal due to an inadequate volume of blood received in culture bottles Performed at [facility] [address] [place], [place] [address]    Culture   Final    NO GROWTH 1 DAY Performed at [facility], [place] [address] [place], [place] [address]    Report Status PENDING  Incomplete  Culture, blood (Routine X 2) w Reflex to ID Panel     Status: None (Preliminary result)   Collection Time: 10/05/19 12:40 AM   Specimen: BLOOD  Result Value Ref Range Status   Specimen Description   Final    BLOOD BLOOD LEFT FOREARM Performed at [facility] [address] [place], [place] [address]    Special Requests   Final    BOTTLES DRAWN AEROBIC ONLY Blood Culture results may not be optimal due to an inadequate volume of blood received in culture bottles Performed at [facility] [address] [place], [place] [address]    Culture   Final    NO GROWTH 1 DAY Performed at [facility], [place] [address] [place], [place] [address]    Report Status PENDING  Incomplete  SARS Coronavirus 2 [facility] order, Performed in [facility] hospital lab) Nasopharyngeal Nasopharyngeal Swab     Status: None   Collection Time: 10/05/19  3:30 AM   Specimen: Nasopharyngeal Swab  Result Value Ref Range Status   SARS Coronavirus 2 NEGATIVE NEGATIVE Final    Comment: (NOTE) If result is NEGATIVE SARS-CoV-2 target nucleic acids are NOT  DETECTED. The SARS-CoV-2 RNA is generally detectable in upper and lower  respiratory specimens during the acute phase of infection. The lowest  concentration of SARS-CoV-2 viral copies this assay can detect is 250  copies / mL. A negative result does not preclude SARS-CoV-2 infection  and should not be used as the sole basis for treatment or other  patient management decisions.  A negative result may occur with  improper specimen collection / handling, submission of specimen other  than nasopharyngeal swab, presence of viral mutation(s) within the  areas targeted by this assay, and inadequate number of viral copies  (<250 copies / mL). A negative result must be combined with clinical  observations, patient history, and epidemiological information. If result is POSITIVE SARS-CoV-2 target nucleic acids are DETECTED. The SARS-CoV-2 RNA is generally detectable in upper and lower  respiratory specimens dur ing the acute phase of infection.  Positive  results are indicative of active infection with SARS-CoV-2.  Clinical  correlation with patient history and other diagnostic information is  necessary to determine patient infection status.  Positive results do  not rule out bacterial infection or co-infection with other viruses. If result is PRESUMPTIVE POSTIVE SARS-CoV-2 nucleic acids MAY BE PRESENT.   A presumptive positive result was obtained on the submitted specimen  and confirmed on repeat testing.  While 2019 novel coronavirus  (SARS-CoV-2) nucleic acids may be present in the submitted sample  additional confirmatory testing may be necessary for epidemiological  and / or clinical management purposes  to differentiate between  SARS-CoV-2 and other Sarbecovirus currently known to infect humans.  If clinically indicated additional testing with an alternate test  methodology [id number]) is advised. The SARS-CoV-2 RNA is generally  detectable in upper and lower respiratory sp ecimens during  the acute  phase of infection. The expected result is Negative. Fact Sheet for Patients:  StrictlyIdeas.no Fact Sheet for Healthcare Providers: BankingDealers.co.za This test is not yet approved or cleared by the [place] FDA and has been authorized for detection and/or diagnosis of SARS-CoV-2 by FDA under an Emergency Use Authorization (EUA).  This EUA will remain in effect (meaning this test can be used) for the duration of the COVID-19 declaration under Section 564(b)(1) of the Act, 21 U.S.C. section 360bbb-3(b)(1), unless the authorization is terminated or revoked sooner. Performed at [facility], [place] [address], [place], [place] [address]   Urine Culture     Status: None   Collection Time: 10/05/19  5:55 AM   Specimen: Urine, Catheterized  Result Value Ref Range Status   Specimen Description   Final    URINE, CATHETERIZED Performed at [facility] [address] [place], [place] [address]    Special Requests   Final    NONE Performed at [facility], [place] [address], [place], [place] [address]    Culture   Final    NO GROWTH Performed at [facility], [place] [address], [place], [place] [address]    Report Status 10/06/2019 FINAL  Final     Labs: BNP (last 3 results) No results for input(s): BNP in the last 8760 hours. Basic Metabolic Panel: Recent Labs  Lab 10/04/19 2211  NA 133*  K 3.9  CL 99  CO2 24  GLUCOSE 113*  BUN 17  CREATININE 1.05  CALCIUM 8.7*   Liver Function Tests: Recent Labs  Lab 10/04/19 2211  AST 15  ALT 6  ALKPHOS 66  BILITOT 0.5  PROT 8.3*  ALBUMIN 2.1*   No results for input(s): LIPASE, AMYLASE in the last 168 hours. Recent Labs  Lab 10/05/19 0040  AMMONIA 23   CBC: Recent Labs  Lab 10/04/19 2211  WBC 11.5*  NEUTROABS 9.0*  HGB 9.4*  HCT 30.7*  MCV 85.3  PLT 223   Cardiac Enzymes: No results for  input(s): CKTOTAL, CKMB, CKMBINDEX, TROPONINI in the last 168 hours. BNP: Invalid input(s): POCBNP CBG: No results for input(s): GLUCAP in the  last 168 hours. D-Dimer No results for input(s): DDIMER in the last 72 hours. Hgb A1c No results for input(s): HGBA1C in the last 72 hours. Lipid Profile No results for input(s): CHOL, HDL, LDLCALC, TRIG, CHOLHDL, LDLDIRECT in the last 72 hours. Thyroid function studies No results for input(s): TSH, T4TOTAL, T3FREE, THYROIDAB in the last 72 hours.  Invalid input(s): FREET3 Anemia work up No results for input(s): VITAMINB12, FOLATE, FERRITIN, TIBC, IRON, RETICCTPCT in the last 72 hours. Urinalysis    Component Value Date/Time   COLORURINE YELLOW 10/05/2019 0555   APPEARANCEUR TURBID (A) 10/05/2019 0555   LABSPEC 1.023 10/05/2019 0555   PHURINE 5.0 10/05/2019 0555   GLUCOSEU NEGATIVE 10/05/2019 0555   HGBUR NEGATIVE 10/05/2019 0555   [place] NEGATIVE 10/05/2019 0555   [place] 10/05/2019 0555   PROTEINUR 30 (A) 10/05/2019 0555   NITRITE NEGATIVE 10/05/2019 0555   LEUKOCYTESUR NEGATIVE 10/05/2019 0555   Sepsis Labs Invalid input(s): PROCALCITONIN,  WBC,  LACTICIDVEN Microbiology Recent Results (from the past 240 hour(s))  Culture, blood (Routine X 2) w Reflex to ID Panel     Status: None (Preliminary result)   Collection Time: 10/05/19 12:40 AM   Specimen: BLOOD  Result Value Ref Range Status   Specimen Description   Final    BLOOD LEFT ANTECUBITAL Performed at [facility], [place] [address], [place], [place] [address]    Special Requests   Final    BOTTLES DRAWN AEROBIC AND ANAEROBIC Blood Culture results may not be optimal due to an inadequate volume of blood received in culture bottles Performed at [facility] [address], [place], [place] [address]    Culture   Final    NO GROWTH 1 DAY Performed at [facility], [place] [address], [place], [place] [address]    Report  Status PENDING  Incomplete  Culture, blood (Routine X 2) w Reflex to ID Panel     Status: None (Preliminary result)   Collection Time: 10/05/19 12:40 AM   Specimen: BLOOD  Result Value Ref Range Status   Specimen Description   Final    BLOOD BLOOD LEFT FOREARM Performed at [facility] [address] [place], [place] [address]    Special Requests   Final    BOTTLES DRAWN AEROBIC ONLY Blood Culture results may not be optimal due to an inadequate volume of blood received in culture bottles Performed at [facility] [address] [place], [place] [address]    Culture   Final    NO GROWTH 1 DAY Performed at [facility], [place] [address], [place], [place] [address]    Report Status PENDING  Incomplete  SARS Coronavirus 2 [facility] order, Performed in [facility] hospital lab) Nasopharyngeal Nasopharyngeal Swab     Status: None   Collection Time: 10/05/19  3:30 AM   Specimen: Nasopharyngeal Swab  Result Value Ref Range Status   SARS Coronavirus 2 NEGATIVE NEGATIVE Final    Comment: (NOTE) If result is NEGATIVE SARS-CoV-2 target nucleic acids are NOT DETECTED. The SARS-CoV-2 RNA is generally detectable in upper and lower  respiratory specimens during the acute phase of infection. The lowest  concentration of SARS-CoV-2 viral copies this assay can detect is 250  copies / mL. A negative result does not preclude SARS-CoV-2 infection  and should not be used as the sole basis for treatment or other  patient management decisions.  A negative result may occur with  improper  specimen collection / handling, submission of specimen other  than nasopharyngeal swab, presence of viral mutation(s) within the  areas targeted by this assay, and inadequate number of viral copies  (<250 copies / mL). A negative result must be combined with clinical  observations, patient history, and epidemiological information. If result is POSITIVE SARS-CoV-2 target  nucleic acids are DETECTED. The SARS-CoV-2 RNA is generally detectable in upper and lower  respiratory specimens dur ing the acute phase of infection.  Positive  results are indicative of active infection with SARS-CoV-2.  Clinical  correlation with patient history and other diagnostic information is  necessary to determine patient infection status.  Positive results do  not rule out bacterial infection or co-infection with other viruses. If result is PRESUMPTIVE POSTIVE SARS-CoV-2 nucleic acids MAY BE PRESENT.   A presumptive positive result was obtained on the submitted specimen  and confirmed on repeat testing.  While 2019 novel coronavirus  (SARS-CoV-2) nucleic acids may be present in the submitted sample  additional confirmatory testing may be necessary for epidemiological  and / or clinical management purposes  to differentiate between  SARS-CoV-2 and other Sarbecovirus currently known to infect humans.  If clinically indicated additional testing with an alternate test  methodology [phone]) is advised. The SARS-CoV-2 RNA is generally  detectable in upper and lower respiratory sp ecimens during the acute  phase of infection. The expected result is Negative. Fact Sheet for Patients:  StrictlyIdeas.no Fact Sheet for Healthcare Providers: BankingDealers.co.za This test is not yet approved or cleared by the [place] FDA and has been authorized for detection and/or diagnosis of SARS-CoV-2 by FDA under an Emergency Use Authorization (EUA).  This EUA will remain in effect (meaning this test can be used) for the duration of the COVID-19 declaration under Section 564(b)(1) of the Act, 21 U.S.C. section 360bbb-3(b)(1), unless the authorization is terminated or revoked sooner. Performed at [facility], [place] [address], [place], [place] [address]   Urine Culture     Status: None   Collection Time: 10/05/19  5:55 AM    Specimen: Urine, Catheterized  Result Value Ref Range Status   Specimen Description   Final    URINE, CATHETERIZED Performed at [facility] [address] [place], [place] [address]    Special Requests   Final    NONE Performed at [facility], [place] [address], [place], [place] [address]    Culture   Final    NO GROWTH Performed at [facility], [place] [address] [place], [place] [address]    Report Status 10/06/2019 FINAL  Final     Time coordinating discharge: Over 30 minutes  SIGNED:   Ashten Sarnowski J [place] DO  [facility] 10/06/2019, 10:20 AM

## 2019-10-06 NOTE — Progress Notes (Signed)
Nutrition Brief Note RD working remotely.   Patient identified on the Malnutrition Screening Tool (MST) Report  Wt Readings from Last 15 Encounters:  10/05/19 42 kg  09/22/19 49.4 kg  09/08/19 47.8 kg  08/24/19 45.6 kg  08/11/19 49.4 kg  08/08/19 48.1 kg  07/27/19 46.6 kg  07/13/19 48.1 kg  06/29/19 46.7 kg  06/15/19 47.6 kg  06/07/19 47 kg  06/02/19 49.9 kg  05/25/19 50 kg  05/18/19 49.2 kg  05/12/19 49.5 kg    Body mass index is 13.67 kg/m. Patient meets criteria for underweight based on current BMI. Current weight is 93 lb and weight on 9/11 was 105 lb. This indicates 12 lb weight loss (11.4% body weight) in the past 1 month; significant for time frame. Skin WDL.   Current diet order is Regular, patient is consuming approximately 20% of meals at this time. Labs and medications reviewed.   Patient with medical history which includes stage 4 colon cancer diagnosed in 2018. Patient was previously seen by [facility], most recently in August.   Discharge order and discharge summary entered earlier today for patient going home with home hospice. Palliative Care following this admission and last charted on patient shortly after noon today.   No nutrition interventions warranted at this time. If patient does not discharge home today, RD will complete full assessment next available date.      Jarome Matin, MS, RD, LDN, [facility] Inpatient Clinical Dietitian Pager # [phone] After hours/weekend pager # [phone]

## 2019-10-06 NOTE — TOC Initial Note (Signed)
Transition of Care [facility]) - Initial/Assessment Note    Patient Details  Name: Jeff [name] MRN: [id number] Date of Birth: March 27, 1961  Transition of Care [facility]) CM/SW Contact:    Temara Lanum, Marjie Skiff, RN Phone Number:[phone] 10/06/2019, 1:54 PM  Clinical Narrative:                  CM consult for home hospice. This CM met with pt at bedside who defers decision making to his daughter whom he lives with. This CM contacted daughter who states they had services with [facility] earlier in the year and revoked services because of them not covering certain medications. Daughter states that pt no longer needs those medications so they would like to start hospice services back with [facility]. This CM contacted [facility] liaison for referral.      Activities of Daily Living Home Assistive Devices/Equipment: Walker (specify type), Oxygen, Cane (specify quad or straight), Ostomy supplies(front wheeled walker, colostomy) ADL Screening (condition at time of admission) Patient's cognitive ability adequate to safely complete daily activities?: No(patient very lethargic and very slow in processing questions) Is the patient deaf or have difficulty hearing?: No Does the patient have difficulty seeing, even when wearing glasses/contacts?: No Does the patient have difficulty concentrating, remembering, or making decisions?: No Patient able to express need for assistance with ADLs?: Yes Does the patient have difficulty dressing or bathing?: Yes Independently performs ADLs?: No Communication: Independent Dressing (OT): Dependent Is this a change from baseline?: Change from baseline, expected to last >3 days Grooming: Dependent Is this a change from baseline?: Change from baseline, expected to last >3 days Feeding: Dependent Is this a change from baseline?: Change from baseline, expected to last >3 days Bathing: Dependent Is this a change from baseline?: Change from baseline, expected to last >3  days Toileting: Dependent Is this a change from baseline?: Change from baseline, expected to last >3days In/Out Bed: Dependent Is this a change from baseline?: Change from baseline, expected to last >3 days Walks in Home: Dependent Is this a change from baseline?: Change from baseline, expected to last >3 days Does the patient have difficulty walking or climbing stairs?: Yes(secondary to weakness) Weakness of Legs: Both Weakness of Arms/Hands: Both  Permission Sought/Granted                  Emotional Assessment              Admission diagnosis:  Dehydration [E86.0] Generalized weakness [R53.1] Thrush, oral [B37.0] Failure to thrive in adult [R62.7] Patient Active Problem List   Diagnosis Date Noted  . Failure to thrive in adult 10/05/2019  . Generalized weakness 03/02/2019  . Loss of appetite 03/02/2019  . Hypotension 03/02/2019  . Left wrist fracture 03/02/2019  . Weakness 03/02/2019  . Fever   . Bowel perforation ([place]) 02/15/2019  . Dehydration 09/13/2018  . DVT, bilateral lower limbs ([place]) 01/06/2018  . Evaluation by psychiatric service required 01/04/2018  . Malignant neoplasm of colon ([place])   . Palliative care by specialist   . Advance care planning   . DVT (deep venous thrombosis) ([place]) 12/31/2017  . Deep vein thrombosis (DVT) of proximal vein of left lower extremity ([place])   . S/P colostomy ([place]) 09/05/2017  . Presence of IVC filter 09/05/2017  . Lower leg DVT (deep venous thromboembolism), chronic, left ([place]) 08/22/2017  . Status post insertion of inferior vena caval filter 08/22/2017  . Colostomy in place [facility]) 08/22/2017  . Lower GI bleeding 08/19/2017  . Severe  malnutrition ([place]) 07/31/2017  . Colon obstruction ([place]) 07/31/2017  . Malnutrition of moderate degree 07/30/2017  . SBO (small bowel obstruction) ([place]) 07/29/2017  . 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 Diarrhea 07/10/2017  . Abdominal pain  07/10/2017  . SIRS (systemic inflammatory response syndrome) ([place]) 07/10/2017  . Hyponatremia 07/10/2017  . Depression 07/10/2017  . Chronic anticoagulation 07/10/2017  . On antineoplastic chemotherapy 07/10/2017  . 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 Intra-abdominal abscess  07/10/2017  . Hypokalemia 07/10/2017  . Hypomagnesemia 07/10/2017  . 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 Port catheter in place 03/02/2017  . Goals of care, counseling/discussion 02/20/2017  . History of deep vein thrombosis (DVT) of lower extremity 02/18/2017  . Anemia, iron deficiency 01/27/2017  . Protein-calorie malnutrition, severe 01/13/2017  . Primary colon cancer with metastasis to other site [facility]) 01/12/2017   PCP:  Patient, [name] Pharmacy:   [place] [place] [address] Phone: [phone] Fax: [phone]     Social Determinants of Health (SDOH) Interventions    Readmission Risk Interventions No flowsheet data found.

## 2019-10-09 MED FILL — LORAZEPAM 2 MG/ML CONC: 2 | 10 days supply | Qty: 30 | Fill #0

## 2019-10-10 LAB — CULTURE, BLOOD (ROUTINE X 2)
Culture: NO GROWTH
Culture: NO GROWTH

## 2019-10-12 ENCOUNTER — Telehealth: Payer: Self-pay

## 2019-10-12 NOTE — Telephone Encounter (Signed)
Faxed signed orders back to Hospice at fax [phone], sent to HIM for scanning.

## 2019-10-13 ENCOUNTER — Telehealth: Payer: Self-pay

## 2019-10-13 ENCOUNTER — Inpatient Hospital Stay: Payer: Medicaid Other

## 2019-10-13 ENCOUNTER — Inpatient Hospital Stay: Payer: Medicaid Other | Admitting: Hematology

## 2019-10-13 NOTE — Telephone Encounter (Signed)
Called patient's daughter [name] to check on patient and see if she or the patient felt like he needed to come in to see [name] and receive IVF. [name] stated patient was stable and she didn't feel like he needed the appointments today. She confirmed that she would call our office back though if that changed or patient had any other needs.   Appointments canceled, and [name] and infusion RN updated.

## 2019-10-16 ENCOUNTER — Telehealth: Payer: Self-pay | Admitting: *Deleted

## 2019-10-23 ENCOUNTER — Telehealth: Payer: Self-pay

## 2019-10-23 NOTE — Telephone Encounter (Signed)
Faxed signed orders back to Hospice, sent to HIM for scan to chart.  

## 2019-10-27 ENCOUNTER — Encounter: Payer: Self-pay | Admitting: Hematology

## 2019-10-27 NOTE — Progress Notes (Signed)
Attempted to locate [facility] POA for patient's daughter.  HIM([facility]) taken care of.

## 2019-10-29 NOTE — Telephone Encounter (Signed)
"  [facility] [place] [facility].  At seen of patient who has expired.  No signed DNR located, need to talk with provider about death certificate."

## 2019-10-29 DEATH — deceased
